# Patient Record
Sex: Male | Born: 1935 | Race: White | Hispanic: No | Marital: Married | Smoking: Never smoker
Health system: Southern US, Community
[De-identification: ages and names within clinical notes are randomized; demographics above are authoritative.]

## PROBLEM LIST (undated history)

## (undated) DIAGNOSIS — Z95810 Presence of automatic (implantable) cardiac defibrillator: Secondary | ICD-10-CM

## (undated) DIAGNOSIS — N390 Urinary tract infection, site not specified: Secondary | ICD-10-CM

## (undated) DIAGNOSIS — C61 Malignant neoplasm of prostate: Secondary | ICD-10-CM

## (undated) DIAGNOSIS — I5022 Chronic systolic (congestive) heart failure: Secondary | ICD-10-CM

## (undated) DIAGNOSIS — Z952 Presence of prosthetic heart valve: Secondary | ICD-10-CM

## (undated) DIAGNOSIS — K1120 Sialoadenitis, unspecified: Secondary | ICD-10-CM

## (undated) DIAGNOSIS — S32009A Unspecified fracture of unspecified lumbar vertebra, initial encounter for closed fracture: Secondary | ICD-10-CM

## (undated) DIAGNOSIS — I714 Abdominal aortic aneurysm, without rupture, unspecified: Secondary | ICD-10-CM

## (undated) DIAGNOSIS — M1990 Unspecified osteoarthritis, unspecified site: Secondary | ICD-10-CM

## (undated) DIAGNOSIS — G4730 Sleep apnea, unspecified: Secondary | ICD-10-CM

## (undated) DIAGNOSIS — I472 Ventricular tachycardia, unspecified: Secondary | ICD-10-CM

## (undated) DIAGNOSIS — E785 Hyperlipidemia, unspecified: Secondary | ICD-10-CM

## (undated) DIAGNOSIS — R400 Somnolence: Secondary | ICD-10-CM

## (undated) DIAGNOSIS — F0390 Unspecified dementia without behavioral disturbance: Secondary | ICD-10-CM

## (undated) DIAGNOSIS — H9190 Unspecified hearing loss, unspecified ear: Secondary | ICD-10-CM

## (undated) DIAGNOSIS — I350 Nonrheumatic aortic (valve) stenosis: Secondary | ICD-10-CM

## (undated) DIAGNOSIS — J45909 Unspecified asthma, uncomplicated: Secondary | ICD-10-CM

## (undated) HISTORY — PX: COLECTOMY: SHX59

## (undated) HISTORY — PX: HERNIA REPAIR: SHX51

## (undated) HISTORY — DX: Abdominal aortic aneurysm, without rupture, unspecified: I71.40

## (undated) HISTORY — DX: Hyperlipidemia, unspecified: E78.5

## (undated) HISTORY — DX: Abdominal aortic aneurysm, without rupture: I71.4

## (undated) HISTORY — PX: CATARACT EXTRACTION W/ INTRAOCULAR LENS IMPLANT: SHX1309

## (undated) HISTORY — DX: Chronic systolic (congestive) heart failure: I50.22

---
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
## 1999-04-04 ENCOUNTER — Other Ambulatory Visit: Admission: RE | Admit: 1999-04-04 | Discharge: 1999-04-04 | Payer: Self-pay | Admitting: Gastroenterology

## 2002-02-08 ENCOUNTER — Ambulatory Visit (HOSPITAL_BASED_OUTPATIENT_CLINIC_OR_DEPARTMENT_OTHER): Admission: RE | Admit: 2002-02-08 | Discharge: 2002-02-08 | Payer: Self-pay | Admitting: Pulmonary Disease

## 2004-07-19 ENCOUNTER — Emergency Department (HOSPITAL_COMMUNITY): Admission: EM | Admit: 2004-07-19 | Discharge: 2004-07-19 | Payer: Self-pay | Admitting: Family Medicine

## 2004-08-18 ENCOUNTER — Inpatient Hospital Stay (HOSPITAL_COMMUNITY): Admission: EM | Admit: 2004-08-18 | Discharge: 2004-08-24 | Payer: Self-pay | Admitting: Emergency Medicine

## 2004-08-20 HISTORY — PX: CARDIAC CATHETERIZATION: SHX172

## 2004-08-23 HISTORY — PX: CARDIAC DEFIBRILLATOR PLACEMENT: SHX171

## 2004-11-20 ENCOUNTER — Ambulatory Visit (HOSPITAL_COMMUNITY): Admission: RE | Admit: 2004-11-20 | Discharge: 2004-11-20 | Payer: Self-pay | Admitting: Cardiology

## 2005-06-10 ENCOUNTER — Ambulatory Visit: Payer: Self-pay | Admitting: Gastroenterology

## 2005-06-24 ENCOUNTER — Ambulatory Visit: Payer: Self-pay | Admitting: Gastroenterology

## 2012-01-09 DIAGNOSIS — I4891 Unspecified atrial fibrillation: Secondary | ICD-10-CM | POA: Diagnosis not present

## 2012-01-09 DIAGNOSIS — I2510 Atherosclerotic heart disease of native coronary artery without angina pectoris: Secondary | ICD-10-CM | POA: Diagnosis not present

## 2012-01-09 DIAGNOSIS — Z79899 Other long term (current) drug therapy: Secondary | ICD-10-CM | POA: Diagnosis not present

## 2012-01-29 DIAGNOSIS — E782 Mixed hyperlipidemia: Secondary | ICD-10-CM | POA: Diagnosis not present

## 2012-01-29 DIAGNOSIS — I2510 Atherosclerotic heart disease of native coronary artery without angina pectoris: Secondary | ICD-10-CM | POA: Diagnosis not present

## 2012-01-29 DIAGNOSIS — Z8249 Family history of ischemic heart disease and other diseases of the circulatory system: Secondary | ICD-10-CM | POA: Diagnosis not present

## 2012-01-29 DIAGNOSIS — Z950 Presence of cardiac pacemaker: Secondary | ICD-10-CM | POA: Diagnosis not present

## 2012-01-29 HISTORY — PX: NM MYOCAR PERF WALL MOTION: HXRAD629

## 2012-01-31 DIAGNOSIS — I472 Ventricular tachycardia: Secondary | ICD-10-CM | POA: Diagnosis not present

## 2012-01-31 DIAGNOSIS — Z4502 Encounter for adjustment and management of automatic implantable cardiac defibrillator: Secondary | ICD-10-CM | POA: Diagnosis not present

## 2012-02-28 DIAGNOSIS — I4891 Unspecified atrial fibrillation: Secondary | ICD-10-CM | POA: Diagnosis not present

## 2012-02-28 DIAGNOSIS — E785 Hyperlipidemia, unspecified: Secondary | ICD-10-CM | POA: Diagnosis not present

## 2012-02-28 DIAGNOSIS — I10 Essential (primary) hypertension: Secondary | ICD-10-CM | POA: Diagnosis not present

## 2012-02-28 DIAGNOSIS — G4733 Obstructive sleep apnea (adult) (pediatric): Secondary | ICD-10-CM | POA: Diagnosis not present

## 2012-05-11 DIAGNOSIS — I714 Abdominal aortic aneurysm, without rupture: Secondary | ICD-10-CM | POA: Diagnosis not present

## 2012-05-12 DIAGNOSIS — I472 Ventricular tachycardia: Secondary | ICD-10-CM | POA: Diagnosis not present

## 2012-05-12 DIAGNOSIS — I719 Aortic aneurysm of unspecified site, without rupture: Secondary | ICD-10-CM | POA: Diagnosis not present

## 2012-07-01 DIAGNOSIS — R079 Chest pain, unspecified: Secondary | ICD-10-CM | POA: Diagnosis not present

## 2012-07-01 DIAGNOSIS — R05 Cough: Secondary | ICD-10-CM | POA: Diagnosis not present

## 2012-07-01 DIAGNOSIS — Z23 Encounter for immunization: Secondary | ICD-10-CM | POA: Diagnosis not present

## 2012-07-07 DIAGNOSIS — L570 Actinic keratosis: Secondary | ICD-10-CM | POA: Diagnosis not present

## 2012-07-07 DIAGNOSIS — L821 Other seborrheic keratosis: Secondary | ICD-10-CM | POA: Diagnosis not present

## 2012-08-04 DIAGNOSIS — L89309 Pressure ulcer of unspecified buttock, unspecified stage: Secondary | ICD-10-CM | POA: Diagnosis not present

## 2012-08-04 DIAGNOSIS — I10 Essential (primary) hypertension: Secondary | ICD-10-CM | POA: Diagnosis not present

## 2012-08-11 DIAGNOSIS — L0293 Carbuncle, unspecified: Secondary | ICD-10-CM | POA: Diagnosis not present

## 2012-08-11 DIAGNOSIS — L0292 Furuncle, unspecified: Secondary | ICD-10-CM | POA: Diagnosis not present

## 2012-08-20 DIAGNOSIS — Z125 Encounter for screening for malignant neoplasm of prostate: Secondary | ICD-10-CM | POA: Diagnosis not present

## 2012-08-20 DIAGNOSIS — E785 Hyperlipidemia, unspecified: Secondary | ICD-10-CM | POA: Diagnosis not present

## 2012-08-20 DIAGNOSIS — I10 Essential (primary) hypertension: Secondary | ICD-10-CM | POA: Diagnosis not present

## 2012-08-20 DIAGNOSIS — R7301 Impaired fasting glucose: Secondary | ICD-10-CM | POA: Diagnosis not present

## 2012-08-27 DIAGNOSIS — E785 Hyperlipidemia, unspecified: Secondary | ICD-10-CM | POA: Diagnosis not present

## 2012-08-27 DIAGNOSIS — Z0000 Encounter for general adult medical examination without abnormal findings: Secondary | ICD-10-CM | POA: Diagnosis not present

## 2012-08-27 DIAGNOSIS — I472 Ventricular tachycardia: Secondary | ICD-10-CM | POA: Diagnosis not present

## 2012-08-27 DIAGNOSIS — R972 Elevated prostate specific antigen [PSA]: Secondary | ICD-10-CM | POA: Diagnosis not present

## 2012-08-27 DIAGNOSIS — I10 Essential (primary) hypertension: Secondary | ICD-10-CM | POA: Diagnosis not present

## 2012-08-27 DIAGNOSIS — Z1331 Encounter for screening for depression: Secondary | ICD-10-CM | POA: Diagnosis not present

## 2012-08-27 DIAGNOSIS — R7301 Impaired fasting glucose: Secondary | ICD-10-CM | POA: Diagnosis not present

## 2012-09-22 DIAGNOSIS — I509 Heart failure, unspecified: Secondary | ICD-10-CM | POA: Diagnosis not present

## 2012-09-22 DIAGNOSIS — I2510 Atherosclerotic heart disease of native coronary artery without angina pectoris: Secondary | ICD-10-CM | POA: Diagnosis not present

## 2012-09-22 DIAGNOSIS — E782 Mixed hyperlipidemia: Secondary | ICD-10-CM | POA: Diagnosis not present

## 2012-10-09 DIAGNOSIS — H8110 Benign paroxysmal vertigo, unspecified ear: Secondary | ICD-10-CM | POA: Diagnosis not present

## 2012-10-09 DIAGNOSIS — I4891 Unspecified atrial fibrillation: Secondary | ICD-10-CM | POA: Diagnosis not present

## 2012-10-09 DIAGNOSIS — I10 Essential (primary) hypertension: Secondary | ICD-10-CM | POA: Diagnosis not present

## 2013-03-02 DIAGNOSIS — I4891 Unspecified atrial fibrillation: Secondary | ICD-10-CM | POA: Diagnosis not present

## 2013-03-02 DIAGNOSIS — G4733 Obstructive sleep apnea (adult) (pediatric): Secondary | ICD-10-CM | POA: Diagnosis not present

## 2013-03-02 DIAGNOSIS — Z6836 Body mass index (BMI) 36.0-36.9, adult: Secondary | ICD-10-CM | POA: Diagnosis not present

## 2013-03-02 DIAGNOSIS — R7301 Impaired fasting glucose: Secondary | ICD-10-CM | POA: Diagnosis not present

## 2013-03-02 DIAGNOSIS — R972 Elevated prostate specific antigen [PSA]: Secondary | ICD-10-CM | POA: Diagnosis not present

## 2013-03-02 DIAGNOSIS — I10 Essential (primary) hypertension: Secondary | ICD-10-CM | POA: Diagnosis not present

## 2013-03-02 DIAGNOSIS — E785 Hyperlipidemia, unspecified: Secondary | ICD-10-CM | POA: Diagnosis not present

## 2013-03-02 DIAGNOSIS — I472 Ventricular tachycardia: Secondary | ICD-10-CM | POA: Diagnosis not present

## 2013-03-11 ENCOUNTER — Other Ambulatory Visit: Payer: Self-pay | Admitting: *Deleted

## 2013-03-11 MED ORDER — AMIODARONE HCL 100 MG PO TABS: 100.0000 mg | ORAL_TABLET | Freq: Two times a day (BID) | ORAL | Status: DC

## 2013-03-16 ENCOUNTER — Other Ambulatory Visit: Payer: Self-pay | Admitting: *Deleted

## 2013-03-18 DIAGNOSIS — J343 Hypertrophy of nasal turbinates: Secondary | ICD-10-CM | POA: Diagnosis not present

## 2013-03-18 DIAGNOSIS — J310 Chronic rhinitis: Secondary | ICD-10-CM | POA: Diagnosis not present

## 2013-04-01 ENCOUNTER — Ambulatory Visit (INDEPENDENT_AMBULATORY_CARE_PROVIDER_SITE_OTHER): Payer: Medicare Other | Admitting: *Deleted

## 2013-04-01 DIAGNOSIS — I472 Ventricular tachycardia: Secondary | ICD-10-CM | POA: Diagnosis not present

## 2013-04-01 NOTE — Patient Instructions (Addendum)
Your physician recommends that you schedule a follow-up appointment in: September 

## 2013-04-01 NOTE — Progress Notes (Signed)
In office ICD interrogation. Normal device function. No changes made this session. 

## 2013-04-02 ENCOUNTER — Telehealth: Payer: Self-pay | Admitting: *Deleted

## 2013-04-02 ENCOUNTER — Encounter: Payer: Self-pay | Admitting: Cardiovascular Disease

## 2013-04-02 LAB — ICD DEVICE OBSERVATION
BATTERY VOLTAGE: 2.65 V
CHARGE TIME: 10.01 s
DEV-0020ICD: NEGATIVE
FVT: 0
HV IMPEDENCE: 45 Ohm
PACEART VT: 0
RV LEAD IMPEDENCE ICD: 504 Ohm
TZAT-0001FASTVT: 1
TZAT-0001SLOWVT: 1
TZAT-0001SLOWVT: 2
TZAT-0004FASTVT: 8
TZAT-0004SLOWVT: 8
TZAT-0004SLOWVT: 8
TZAT-0005FASTVT: 88 pct
TZAT-0011FASTVT: 10 ms
TZAT-0011SLOWVT: 10 ms
TZAT-0011SLOWVT: 10 ms
TZAT-0013FASTVT: 1
TZAT-0013SLOWVT: 3
TZAT-0013SLOWVT: 3
TZAT-0018FASTVT: NEGATIVE
TZAT-0018SLOWVT: NEGATIVE
TZAT-0018SLOWVT: NEGATIVE
TZON-0003FASTVT: 300 ms
TZON-0003SLOWVT: 400 ms
TZON-0004SLOWVT: 16
TZON-0005SLOWVT: 12
TZST-0001FASTVT: 2
TZST-0001FASTVT: 3
TZST-0001FASTVT: 4
TZST-0001FASTVT: 5
TZST-0001FASTVT: 6
TZST-0001SLOWVT: 3
TZST-0001SLOWVT: 4
TZST-0001SLOWVT: 5
TZST-0001SLOWVT: 6
TZST-0003FASTVT: 30 J
TZST-0003FASTVT: 35 J
TZST-0003FASTVT: 35 J
TZST-0003FASTVT: 35 J
TZST-0003FASTVT: 35 J
TZST-0003SLOWVT: 25 J
TZST-0003SLOWVT: 35 J
TZST-0003SLOWVT: 35 J
TZST-0003SLOWVT: 5 J
VENTRICULAR PACING ICD: 0 pct
VF: 0

## 2013-04-02 NOTE — Telephone Encounter (Signed)
During patient's device clinic appt yesterday pt mentioned how he had been fatigued since he's been back on Amiodarone. Patient and wife stated that he had been on just 100mg  daily in the past and felt great. I informed pt that I would discuss the issue with Dr.Croitoru and inform him of his suggestions.    After pt's appointment I discussed the issue with Dr.Croitoru and he stated that Mr.Biggar could decrease his Amiodarone to 100mg  daily. Patient was informed and voiced understanding.

## 2013-04-11 ENCOUNTER — Encounter (HOSPITAL_COMMUNITY): Payer: Self-pay | Admitting: Family Medicine

## 2013-04-11 ENCOUNTER — Emergency Department (HOSPITAL_COMMUNITY)
Admission: EM | Admit: 2013-04-11 | Discharge: 2013-04-12 | Disposition: A | Discharge status: Home / Self Care | Payer: Medicare Other | Attending: Emergency Medicine | Admitting: Emergency Medicine

## 2013-04-11 ENCOUNTER — Emergency Department (HOSPITAL_COMMUNITY): Payer: Medicare Other

## 2013-04-11 DIAGNOSIS — S4980XA Other specified injuries of shoulder and upper arm, unspecified arm, initial encounter: Secondary | ICD-10-CM | POA: Diagnosis not present

## 2013-04-11 DIAGNOSIS — S4992XA Unspecified injury of left shoulder and upper arm, initial encounter: Secondary | ICD-10-CM

## 2013-04-11 DIAGNOSIS — I472 Ventricular tachycardia, unspecified: Secondary | ICD-10-CM | POA: Insufficient documentation

## 2013-04-11 DIAGNOSIS — T07XXXA Unspecified multiple injuries, initial encounter: Secondary | ICD-10-CM | POA: Diagnosis not present

## 2013-04-11 DIAGNOSIS — Z79899 Other long term (current) drug therapy: Secondary | ICD-10-CM | POA: Insufficient documentation

## 2013-04-11 DIAGNOSIS — Z95810 Presence of automatic (implantable) cardiac defibrillator: Secondary | ICD-10-CM | POA: Diagnosis not present

## 2013-04-11 DIAGNOSIS — R112 Nausea with vomiting, unspecified: Secondary | ICD-10-CM | POA: Insufficient documentation

## 2013-04-11 DIAGNOSIS — R071 Chest pain on breathing: Secondary | ICD-10-CM | POA: Diagnosis not present

## 2013-04-11 DIAGNOSIS — W108XXA Fall (on) (from) other stairs and steps, initial encounter: Secondary | ICD-10-CM | POA: Insufficient documentation

## 2013-04-11 DIAGNOSIS — F411 Generalized anxiety disorder: Secondary | ICD-10-CM | POA: Insufficient documentation

## 2013-04-11 DIAGNOSIS — Z7982 Long term (current) use of aspirin: Secondary | ICD-10-CM | POA: Insufficient documentation

## 2013-04-11 DIAGNOSIS — M549 Dorsalgia, unspecified: Secondary | ICD-10-CM | POA: Diagnosis not present

## 2013-04-11 DIAGNOSIS — S298XXA Other specified injuries of thorax, initial encounter: Secondary | ICD-10-CM | POA: Diagnosis not present

## 2013-04-11 DIAGNOSIS — S46909A Unspecified injury of unspecified muscle, fascia and tendon at shoulder and upper arm level, unspecified arm, initial encounter: Secondary | ICD-10-CM | POA: Insufficient documentation

## 2013-04-11 DIAGNOSIS — Z8719 Personal history of other diseases of the digestive system: Secondary | ICD-10-CM | POA: Diagnosis not present

## 2013-04-11 DIAGNOSIS — IMO0002 Reserved for concepts with insufficient information to code with codable children: Secondary | ICD-10-CM | POA: Diagnosis not present

## 2013-04-11 DIAGNOSIS — S79919A Unspecified injury of unspecified hip, initial encounter: Secondary | ICD-10-CM | POA: Diagnosis not present

## 2013-04-11 DIAGNOSIS — M25559 Pain in unspecified hip: Secondary | ICD-10-CM | POA: Diagnosis not present

## 2013-04-11 DIAGNOSIS — S0990XA Unspecified injury of head, initial encounter: Secondary | ICD-10-CM | POA: Diagnosis not present

## 2013-04-11 DIAGNOSIS — Y92009 Unspecified place in unspecified non-institutional (private) residence as the place of occurrence of the external cause: Secondary | ICD-10-CM | POA: Insufficient documentation

## 2013-04-11 DIAGNOSIS — R51 Headache: Secondary | ICD-10-CM | POA: Diagnosis not present

## 2013-04-11 DIAGNOSIS — M545 Low back pain: Secondary | ICD-10-CM | POA: Diagnosis not present

## 2013-04-11 DIAGNOSIS — M25519 Pain in unspecified shoulder: Secondary | ICD-10-CM | POA: Diagnosis not present

## 2013-04-11 DIAGNOSIS — R404 Transient alteration of awareness: Secondary | ICD-10-CM | POA: Insufficient documentation

## 2013-04-11 DIAGNOSIS — Y998 Other external cause status: Secondary | ICD-10-CM | POA: Insufficient documentation

## 2013-04-11 DIAGNOSIS — T148XXA Other injury of unspecified body region, initial encounter: Secondary | ICD-10-CM | POA: Diagnosis not present

## 2013-04-11 DIAGNOSIS — I4729 Other ventricular tachycardia: Secondary | ICD-10-CM | POA: Insufficient documentation

## 2013-04-11 HISTORY — DX: Ventricular tachycardia: I47.2

## 2013-04-11 HISTORY — DX: Ventricular tachycardia, unspecified: I47.20

## 2013-04-11 MED ORDER — HYDROCODONE-ACETAMINOPHEN 5-325 MG PO TABS
1.0000 | ORAL_TABLET | Freq: Once | ORAL | Status: DC
  Filled 2013-04-11: qty 1

## 2013-04-11 MED ORDER — OXYCODONE HCL 5 MG PO TABS
5.0000 mg | ORAL_TABLET | Freq: Once | ORAL | Status: AC
  Administered 2013-04-11: 5 mg via ORAL
  Filled 2013-04-11: qty 1

## 2013-04-11 MED ORDER — ACETAMINOPHEN 325 MG PO TABS
650.0000 mg | ORAL_TABLET | Freq: Once | ORAL | Status: AC
  Administered 2013-04-11: 650 mg via ORAL
  Filled 2013-04-11: qty 2

## 2013-04-11 NOTE — ED Notes (Signed)
Pt from home via EMS. Pt reports tripping and falling backwards down 3 thickly-carpeted steps. Pt denies LOC, denies head injury or back pain, Pt c/o upper back and right shoulder pain. Pt had 2 episodes of emesis en route and was given 4mg  Zofran IV with relief. Pt A&O x4. Pt with ICD in place, denies dizziness.

## 2013-04-11 NOTE — ED Notes (Signed)
This RN received call from Xray stating pt became very anxious and "shaking" with pain. Upon arrival to Xray, pt lying in stretcher, states he is in too much pain to take X-rays at this time, states is "really worked up and anxious about my condition". This RN brought pt back to room and alerted EDPA. Pt alert and oriented x4. Pt cooperative.

## 2013-04-11 NOTE — ED Provider Notes (Signed)
History    CSN: [id number] Arrival date & time 04/11/13  2214  First MD Initiated Contact with Patient 04/11/13 2232     Chief Complaint  Patient presents with  . Fall   (Consider location/radiation/quality/duration/timing/severity/associated sxs/prior Treatment) HPI  79 year old male with history of ventricular tachycardia with ICD in place presents for evaluation of fall.  patient reports he normally uses both of his hands to grab the rail while walking up the stairs at his house. Today while walking up the stairs he was carrying a small blanket in one hand; he walks up 3 steps, lost his balance and fell backward hitting his right shoulder and back against the carpet steps. He complaining of sudden onset of sharp pain to right shoulder and right ribs. Pain is worsened with movement. He denies hitting his head or loss of consciousness. Denies any precipitating symptoms prior to fall. No complaint of neck pain, shortness of breath, abdominal pain, hip or leg pain. He does not take any blood thinner medication. He denies feeling any firing of his ICD device. Denies headache, dizziness or lightheadedness. He was brought here via EMS. Patient felt nauseous and had 2 bouts of emesis while route. He did receive Zofran and the symptom has resolved. Incident happened about an hour ago.  Past Medical History  Diagnosis Date  . ICD (implantable cardioverter-defibrillator) in place   . Ventricular tachycardia   . Hemorrhage     Post-colonostomy  . Hernia    Past Surgical History  Procedure Laterality Date  . Colon surgery    . Hernia repair     No family history on file. History  Substance Use Topics  . Smoking status: Never Smoker   . Smokeless tobacco: Never Used  . Alcohol Use: 0.0 oz/week    0 Cans of beer per week     Comment: "very Little"    Review of Systems  Constitutional: Negative for fever and diaphoresis.  HENT: Negative for neck pain.   Respiratory: Negative for  shortness of breath.   Cardiovascular:       Chest wall pain  Gastrointestinal: Negative for abdominal pain.  Skin: Negative for wound.  Neurological: Negative for numbness and headaches.    Allergies  Review of patient's allergies indicates not on file.  Home Medications   Current Outpatient Rx  Name  Route  Sig  Dispense  Refill  . amiodarone (PACERONE) 200 MG tablet   Oral   Take 100 mg by mouth daily.         Marland Kitchen aspirin 81 MG tablet   Oral   Take 81 mg by mouth daily.         . benzonatate (TESSALON) 100 MG capsule   Oral   Take 100 mg by mouth 3 (three) times daily as needed for cough.         . Cholecalciferol (VITAMIN D3) 5000 UNITS CAPS   Oral   Take by mouth daily.         Marland Kitchen ezetimibe-simvastatin (VYTORIN) 10-20 MG per tablet   Oral   Take 1 tablet by mouth at bedtime.         . fluticasone (FLONASE) 50 MCG/ACT nasal spray   Nasal   Place 2 sprays into the nose daily.         Marland Kitchen ipratropium (ATROVENT) 0.06 % nasal spray   Nasal   Place 2 sprays into the nose 2 (two) times daily. 2 sprays BID         .  loratadine (CLARITIN) 10 MG tablet   Oral   Take 10 mg by mouth as needed for allergies.         . metoprolol (LOPRESSOR) 50 MG tablet   Oral   Take 50 mg by mouth 2 (two) times daily.         . Multiple Minerals-Vitamins (CALCIUM & VIT D3 BONE HEALTH PO)   Oral   Take by mouth as needed.          BP 129/74  Pulse 76  Temp(Src) 97.7 F (36.5 C) (Oral)  Resp 18  Ht 5\' 10"  (1.778 m)  Wt 232 lb (105.235 kg)  BMI 33.29 kg/m2  SpO2 94% Physical Exam  Nursing note and vitals reviewed. Constitutional: He is oriented to person, place, and time. He appears well-developed and well-nourished. No distress.  HENT:  Head: Normocephalic and atraumatic.  No hemotympanum, no septal hematoma, no midface tenderness, no malocclusion.  Eyes: Conjunctivae are normal.  Neck: Normal range of motion. Neck supple.  Cardiovascular: Normal rate and  regular rhythm.   Pulmonary/Chest: Effort normal and breath sounds normal. He exhibits tenderness (Tenderness to right upper chest wall on palpation without crepitus, chest, deformity, emphysema, bruising).  Abdominal: Soft. There is no tenderness.  Musculoskeletal: He exhibits tenderness (R shoulder with mild tenderness throughout with decreased ROM, without deformity, bruise, or abrasion.).  Tenderness to right parathoracic region.  No deformity  Neurological: He is alert and oriented to person, place, and time.  Skin: Skin is warm. No rash noted.  Psychiatric: He has a normal mood and affect.    ED Course  Procedures (including critical care time)  10:51 PM Pt had mechanical fall, c/o pain of R shoulder, upper chest and upper back.  Will obtain xray.  Pain medication offered, pt request tylenol only.  NO significant midline spine tenderness.  Is NVI.    His O2 is 94% on RA which I interpret to be normal  12:10 AM Pt sts he's anxious and pain is not well controlled with oxycodone given 30 min ago.  Will give ativan 1mg  IV for anxiety.  WIll obtain xray.    1:13 AM X-ray shows no acute fractures or dislocation. Patient felt relieved afterward. Patient able to move right arm with some mild tenderness. However, when attempt to ambulate pt reports having difficulty standing.  Does c/o of pain to lower back and right hip, which was not appreciate on initial exam.  Will obtain xray of lspine and R hip.    If negative, pt will be discharge with Tylenol. Patient to followup with his PCP for further care. Return precautions discussed.  Care discussed with attending who will continue to monitor pt.    Labs Reviewed - No data to display Dg Ribs Unilateral W/chest Right  04/12/2013   *RADIOLOGY REPORT*  Clinical Data: Status post fall.  Upper back and right shoulder pain.  RIGHT RIBS AND CHEST - 3+ VIEW  Comparison: PA and lateral chest 08/24/2004.  Findings: AICD is in place.  There is  cardiomegaly.  Lungs are clear without pneumothorax or pleural effusion.  No fracture is identified.  IMPRESSION: No acute finding.   Original Report Authenticated By: Holley Dexter, M.D.   Dg Thoracic Spine 2 View  04/12/2013   *RADIOLOGY REPORT*  Clinical Data: Fall.  Back pain.  THORACIC SPINE - 2 VIEW  Comparison: PA and lateral chest 08/24/2004.  Findings: Vertebral body height and alignment appear normal. Scattered anterior endplate spurring is noted.  IMPRESSION: No acute finding.   Original Report Authenticated By: Holley Dexter, M.D.   Dg Shoulder Right  04/12/2013   *RADIOLOGY REPORT*  Clinical Data: Fall.  Right shoulder pain.  RIGHT SHOULDER - 2+ VIEW  Comparison: None.  Findings: There is no acute bony or joint abnormality.  Moderate acromioclavicular degenerative change is noted.  IMPRESSION: No acute finding.   Original Report Authenticated By: Holley Dexter, M.D.   1. Fall at home, initial encounter   2. Injury of left shoulder and upper arm     MDM  BP 108/70  Pulse 80  Temp(Src) 97.7 F (36.5 C) (Oral)  Resp 18  Ht 5\' 10"  (1.778 m)  Wt 232 lb (105.235 kg)  BMI 33.29 kg/m2  SpO2 93%  I have reviewed nursing notes and vital signs. I personally reviewed the imaging tests through PACS system  I reviewed available ER/hospitalization records thought the EMR   Fayrene Helper, PA-C 04/12/13 0114  Fayrene Helper, PA-C 04/12/13 [id number]

## 2013-04-11 NOTE — ED Notes (Signed)
PT declines Vicodin, requesting tylenol. EDPA aware

## 2013-04-12 ENCOUNTER — Emergency Department (HOSPITAL_COMMUNITY): Payer: Medicare Other

## 2013-04-12 ENCOUNTER — Encounter (HOSPITAL_COMMUNITY): Payer: Self-pay

## 2013-04-12 DIAGNOSIS — IMO0002 Reserved for concepts with insufficient information to code with codable children: Secondary | ICD-10-CM | POA: Diagnosis not present

## 2013-04-12 DIAGNOSIS — S79929A Unspecified injury of unspecified thigh, initial encounter: Secondary | ICD-10-CM | POA: Diagnosis not present

## 2013-04-12 DIAGNOSIS — M549 Dorsalgia, unspecified: Secondary | ICD-10-CM | POA: Diagnosis not present

## 2013-04-12 DIAGNOSIS — S4980XA Other specified injuries of shoulder and upper arm, unspecified arm, initial encounter: Secondary | ICD-10-CM | POA: Diagnosis not present

## 2013-04-12 DIAGNOSIS — S298XXA Other specified injuries of thorax, initial encounter: Secondary | ICD-10-CM | POA: Diagnosis not present

## 2013-04-12 DIAGNOSIS — M545 Low back pain: Secondary | ICD-10-CM | POA: Diagnosis not present

## 2013-04-12 DIAGNOSIS — R51 Headache: Secondary | ICD-10-CM | POA: Diagnosis not present

## 2013-04-12 DIAGNOSIS — M25559 Pain in unspecified hip: Secondary | ICD-10-CM | POA: Diagnosis not present

## 2013-04-12 DIAGNOSIS — S0990XA Unspecified injury of head, initial encounter: Secondary | ICD-10-CM | POA: Diagnosis not present

## 2013-04-12 DIAGNOSIS — M25519 Pain in unspecified shoulder: Secondary | ICD-10-CM | POA: Diagnosis not present

## 2013-04-12 LAB — ETHANOL: Alcohol, Ethyl (B): <11 mg/dL (ref 0–11)

## 2013-04-12 LAB — BASIC METABOLIC PANEL
Chloride: 101 mEq/L (ref 96–112)
Creatinine, Ser: 1.11 mg/dL (ref 0.50–1.35)
GFR calc Af Amer: 69 mL/min — ABNORMAL LOW (ref >90)
Potassium: 4.2 mEq/L (ref 3.5–5.1)
Sodium: 134 mEq/L — ABNORMAL LOW (ref 135–145)

## 2013-04-12 LAB — CBC
MCV: 92.9 fL (ref 78.0–100.0)
Platelets: 106 10*3/uL — ABNORMAL LOW (ref 150–400)
RBC: 4.35 MIL/uL (ref 4.22–5.81)
RDW: 13.8 % (ref 11.5–15.5)
WBC: 8.8 10*3/uL (ref 4.0–10.5)

## 2013-04-12 LAB — GLUCOSE, CAPILLARY: Glucose-Capillary: 189 mg/dL — ABNORMAL HIGH (ref 70–99)

## 2013-04-12 MED ORDER — MORPHINE SULFATE 4 MG/ML IJ SOLN
4.0000 mg | Freq: Once | INTRAMUSCULAR | Status: AC
  Administered 2013-04-12: 4 mg via INTRAVENOUS
  Filled 2013-04-12: qty 1

## 2013-04-12 MED ORDER — KETOROLAC TROMETHAMINE 30 MG/ML IJ SOLN
30.0000 mg | Freq: Once | INTRAMUSCULAR | Status: AC
  Administered 2013-04-12: 30 mg via INTRAVENOUS
  Filled 2013-04-12: qty 1

## 2013-04-12 MED ORDER — LORAZEPAM 2 MG/ML IJ SOLN
1.0000 mg | Freq: Once | INTRAMUSCULAR | Status: AC
  Administered 2013-04-12: 1 mg via INTRAVENOUS
  Filled 2013-04-12: qty 1

## 2013-04-12 MED ORDER — OXYCODONE-ACETAMINOPHEN 5-325 MG PO TABS: 1.0000 | ORAL_TABLET | ORAL | Status: DC | PRN

## 2013-04-12 MED ORDER — LORAZEPAM 2 MG/ML IJ SOLN: 1.0000 mg | Freq: Once | INTRAMUSCULAR | Status: DC

## 2013-04-12 MED ORDER — ACETAMINOPHEN 500 MG PO TABS: 500.0000 mg | ORAL_TABLET | Freq: Four times a day (QID) | ORAL | Status: DC | PRN

## 2013-04-12 NOTE — ED Notes (Signed)
Pt ambulated 7 feet stand by assist only

## 2013-04-12 NOTE — ED Notes (Signed)
Pt transported to Xray by this RN. 

## 2013-04-12 NOTE — ED Provider Notes (Signed)
Medical screening examination/treatment/procedure(s) were conducted as a shared visit with non-physician practitioner(s) and myself.  I personally evaluated the patient during the encounter  6:41 AM The patient feels much better at this time.  He is able to ambulate without as much difficulty.  Several times through the night had ambulated him he was having ongoing significant pain and is back and "all over" from the fall.  I think much of his difficulty getting around in the emergency apartment in the middle night with secondary to pain medications.  At one point he seems slightly confused/alternate for head CT was obtained to make sure we are not missing an occult head bleed that was worsening.  CBG was normal.  EKG demonstrates sinus rhythm with occasional PVCs. Discharge home in good condition.  The patient was offered a prescription for walker but he states he has a walker at home they will use to help with stability.  He was encouraged to return the ER for new or worsening symptoms   Date: 04/12/2013  Rate: 84  Rhythm: normal sinus rhythm  QRS Axis: normal  Intervals: normal  ST/T Wave abnormalities: normal  Conduction Disutrbances: Nonspecific interventricular conduction delay  Narrative Interpretation: occasional PVC  Old EKG Reviewed: no prior ecg available     Results for orders placed during the hospital encounter of 04/11/13  CBC      Result Value Range   WBC 8.8  4.0 - 10.5 K/uL   RBC 4.35  4.22 - 5.81 MIL/uL   Hemoglobin 14.6  13.0 - 17.0 g/dL   HCT 16.1  09.6 - 04.5 %   MCV 92.9  78.0 - 100.0 fL   MCH 33.6  26.0 - 34.0 pg   MCHC 36.1 (*) 30.0 - 36.0 g/dL   RDW 40.9  81.1 - 91.4 %   Platelets 106 (*) 150 - 400 K/uL  GLUCOSE, CAPILLARY      Result Value Range   Glucose-Capillary 189 (*) 70 - 99 mg/dL     Dg Ribs Unilateral W/chest Right  04/12/2013   *RADIOLOGY REPORT*  Clinical Data: Status post fall.  Upper back and right shoulder pain.  RIGHT RIBS AND CHEST - 3+  VIEW  Comparison: PA and lateral chest 08/24/2004.  Findings: AICD is in place.  There is cardiomegaly.  Lungs are clear without pneumothorax or pleural effusion.  No fracture is identified.  IMPRESSION: No acute finding.   Original Report Authenticated By: Holley Dexter, M.D.   Dg Thoracic Spine 2 View  04/12/2013   *RADIOLOGY REPORT*  Clinical Data: Fall.  Back pain.  THORACIC SPINE - 2 VIEW  Comparison: PA and lateral chest 08/24/2004.  Findings: Vertebral body height and alignment appear normal. Scattered anterior endplate spurring is noted.  IMPRESSION: No acute finding.   Original Report Authenticated By: Holley Dexter, M.D.   Dg Lumbar Spine Complete  04/12/2013   *RADIOLOGY REPORT*  Clinical Data: Fall.  Pain.  LUMBAR SPINE - COMPLETE 4+ VIEW  Comparison: None.  Findings: Osteopenia.  4.7 cm infrarenal abdominal aortic aneurysm. The thigh is noted by calcification.  Mild multilevel lumbar spondylosis is present, most pronounced at L4-L5.  Although there appears to be the compression fracture of L1 on the frontal view, on the lateral view there is no significant loss of vertebral body height.  This may be projectional or due to a Schmorl's node distorting the appearance on the frontal view.  Mild levoconvex curvature of the lumbar spine.  IMPRESSION: Mild lumbar spondylosis.  No acute osseous abnormality.   Original Report Authenticated By: Andreas Newport, M.D.   Dg Shoulder Right  04/12/2013   *RADIOLOGY REPORT*  Clinical Data: Fall.  Right shoulder pain.  RIGHT SHOULDER - 2+ VIEW  Comparison: None.  Findings: There is no acute bony or joint abnormality.  Moderate acromioclavicular degenerative change is noted.  IMPRESSION: No acute finding.   Original Report Authenticated By: Holley Dexter, M.D.   Dg Hip Complete Right  04/12/2013   *RADIOLOGY REPORT*  Clinical Data: Fall.  Pelvic pain.  RIGHT HIP - COMPLETE 2+ VIEW  Comparison: None.  Findings: Pelvic rings appear intact.  Pubic  symphysis appears normal.  Right hip is located.  Atherosclerosis is present. Obturator rings appear within normal limits.  No fracture. Surgical clip in the anatomic pelvis.  IMPRESSION: No acute osseous abnormality.   Original Report Authenticated By: Andreas Newport, M.D.   Ct Head Wo Contrast  04/12/2013   *RADIOLOGY REPORT*  Clinical Data: Fall.  Head pain.  CT HEAD WITHOUT CONTRAST  Technique:  Contiguous axial images were obtained from the base of the skull through the vertex without contrast.  Comparison: 07/19/2004.  Findings: Vertebrobasilar dolichoectasia. No mass lesion, mass effect, midline shift, hydrocephalus, hemorrhage.  No acute territorial cortical ischemia/infarct. Atrophy and chronic ischemic white matter disease is present.  Benign basal ganglia calcifications are present.  Mastoid air cells and paranasal sinuses within normal limits.  Right lens extraction.  Extraocular muscle calcifications.  Calvarium appears intact.  IMPRESSION: No acute intracranial abnormality.  Atrophy and chronic ischemic white matter disease.   Original Report Authenticated By: Andreas Newport, M.D.  I personally reviewed the imaging tests through PACS system I reviewed available ER/hospitalization records through the EMR   Lyanne Co, MD 04/12/13 [id number]

## 2013-04-12 NOTE — ED Notes (Signed)
Pt transported to Xray. 

## 2013-04-12 NOTE — ED Notes (Signed)
Pt returned from Xray. Alert and comfortable

## 2013-04-12 NOTE — ED Notes (Addendum)
Attempted to ambulate pt with Fayrene Helper PA and Ali Lowe RN at bedside. Pt able to stand and bear weight on BLE with three staff assist, unable to ambulate. Pt calling out in pain upon standing but unable to state exact location of pain.

## 2013-04-12 NOTE — ED Notes (Signed)
[place]ie PA notified of unchanged pain level.

## 2013-04-12 NOTE — ED Notes (Signed)
Pt returned from radiology.

## 2013-04-15 DIAGNOSIS — S32009A Unspecified fracture of unspecified lumbar vertebra, initial encounter for closed fracture: Secondary | ICD-10-CM | POA: Diagnosis not present

## 2013-04-21 DIAGNOSIS — IMO0002 Reserved for concepts with insufficient information to code with codable children: Secondary | ICD-10-CM | POA: Diagnosis not present

## 2013-04-21 DIAGNOSIS — R269 Unspecified abnormalities of gait and mobility: Secondary | ICD-10-CM | POA: Diagnosis not present

## 2013-04-21 DIAGNOSIS — M5137 Other intervertebral disc degeneration, lumbosacral region: Secondary | ICD-10-CM | POA: Diagnosis not present

## 2013-04-21 DIAGNOSIS — S32009A Unspecified fracture of unspecified lumbar vertebra, initial encounter for closed fracture: Secondary | ICD-10-CM | POA: Diagnosis not present

## 2013-04-21 DIAGNOSIS — Z9181 History of falling: Secondary | ICD-10-CM | POA: Diagnosis not present

## 2013-04-21 DIAGNOSIS — G8911 Acute pain due to trauma: Secondary | ICD-10-CM | POA: Diagnosis not present

## 2013-04-23 DIAGNOSIS — Z9181 History of falling: Secondary | ICD-10-CM | POA: Diagnosis not present

## 2013-04-23 DIAGNOSIS — IMO0002 Reserved for concepts with insufficient information to code with codable children: Secondary | ICD-10-CM | POA: Diagnosis not present

## 2013-04-23 DIAGNOSIS — M5137 Other intervertebral disc degeneration, lumbosacral region: Secondary | ICD-10-CM | POA: Diagnosis not present

## 2013-04-23 DIAGNOSIS — G8911 Acute pain due to trauma: Secondary | ICD-10-CM | POA: Diagnosis not present

## 2013-04-23 DIAGNOSIS — R269 Unspecified abnormalities of gait and mobility: Secondary | ICD-10-CM | POA: Diagnosis not present

## 2013-04-24 DIAGNOSIS — R269 Unspecified abnormalities of gait and mobility: Secondary | ICD-10-CM | POA: Diagnosis not present

## 2013-04-24 DIAGNOSIS — Z9181 History of falling: Secondary | ICD-10-CM | POA: Diagnosis not present

## 2013-04-24 DIAGNOSIS — G8911 Acute pain due to trauma: Secondary | ICD-10-CM | POA: Diagnosis not present

## 2013-04-24 DIAGNOSIS — M5137 Other intervertebral disc degeneration, lumbosacral region: Secondary | ICD-10-CM | POA: Diagnosis not present

## 2013-04-24 DIAGNOSIS — IMO0002 Reserved for concepts with insufficient information to code with codable children: Secondary | ICD-10-CM | POA: Diagnosis not present

## 2013-04-26 DIAGNOSIS — M5137 Other intervertebral disc degeneration, lumbosacral region: Secondary | ICD-10-CM | POA: Diagnosis not present

## 2013-04-26 DIAGNOSIS — IMO0002 Reserved for concepts with insufficient information to code with codable children: Secondary | ICD-10-CM | POA: Diagnosis not present

## 2013-04-26 DIAGNOSIS — Z9181 History of falling: Secondary | ICD-10-CM | POA: Diagnosis not present

## 2013-04-26 DIAGNOSIS — G8911 Acute pain due to trauma: Secondary | ICD-10-CM | POA: Diagnosis not present

## 2013-04-26 DIAGNOSIS — R269 Unspecified abnormalities of gait and mobility: Secondary | ICD-10-CM | POA: Diagnosis not present

## 2013-04-28 DIAGNOSIS — M5137 Other intervertebral disc degeneration, lumbosacral region: Secondary | ICD-10-CM | POA: Diagnosis not present

## 2013-04-28 DIAGNOSIS — R269 Unspecified abnormalities of gait and mobility: Secondary | ICD-10-CM | POA: Diagnosis not present

## 2013-04-28 DIAGNOSIS — Z9181 History of falling: Secondary | ICD-10-CM | POA: Diagnosis not present

## 2013-04-28 DIAGNOSIS — IMO0002 Reserved for concepts with insufficient information to code with codable children: Secondary | ICD-10-CM | POA: Diagnosis not present

## 2013-04-28 DIAGNOSIS — G8911 Acute pain due to trauma: Secondary | ICD-10-CM | POA: Diagnosis not present

## 2013-04-29 DIAGNOSIS — S32009A Unspecified fracture of unspecified lumbar vertebra, initial encounter for closed fracture: Secondary | ICD-10-CM | POA: Diagnosis not present

## 2013-04-30 DIAGNOSIS — IMO0002 Reserved for concepts with insufficient information to code with codable children: Secondary | ICD-10-CM | POA: Diagnosis not present

## 2013-04-30 DIAGNOSIS — M5137 Other intervertebral disc degeneration, lumbosacral region: Secondary | ICD-10-CM | POA: Diagnosis not present

## 2013-04-30 DIAGNOSIS — Z9181 History of falling: Secondary | ICD-10-CM | POA: Diagnosis not present

## 2013-04-30 DIAGNOSIS — R269 Unspecified abnormalities of gait and mobility: Secondary | ICD-10-CM | POA: Diagnosis not present

## 2013-04-30 DIAGNOSIS — G8911 Acute pain due to trauma: Secondary | ICD-10-CM | POA: Diagnosis not present

## 2013-05-04 DIAGNOSIS — M5137 Other intervertebral disc degeneration, lumbosacral region: Secondary | ICD-10-CM | POA: Diagnosis not present

## 2013-05-04 DIAGNOSIS — R269 Unspecified abnormalities of gait and mobility: Secondary | ICD-10-CM | POA: Diagnosis not present

## 2013-05-04 DIAGNOSIS — G8911 Acute pain due to trauma: Secondary | ICD-10-CM | POA: Diagnosis not present

## 2013-05-04 DIAGNOSIS — IMO0002 Reserved for concepts with insufficient information to code with codable children: Secondary | ICD-10-CM | POA: Diagnosis not present

## 2013-05-04 DIAGNOSIS — Z9181 History of falling: Secondary | ICD-10-CM | POA: Diagnosis not present

## 2013-05-06 DIAGNOSIS — IMO0002 Reserved for concepts with insufficient information to code with codable children: Secondary | ICD-10-CM | POA: Diagnosis not present

## 2013-05-06 DIAGNOSIS — Z9181 History of falling: Secondary | ICD-10-CM | POA: Diagnosis not present

## 2013-05-06 DIAGNOSIS — M5137 Other intervertebral disc degeneration, lumbosacral region: Secondary | ICD-10-CM | POA: Diagnosis not present

## 2013-05-06 DIAGNOSIS — G8911 Acute pain due to trauma: Secondary | ICD-10-CM | POA: Diagnosis not present

## 2013-05-06 DIAGNOSIS — R269 Unspecified abnormalities of gait and mobility: Secondary | ICD-10-CM | POA: Diagnosis not present

## 2013-05-10 DIAGNOSIS — R269 Unspecified abnormalities of gait and mobility: Secondary | ICD-10-CM | POA: Diagnosis not present

## 2013-05-10 DIAGNOSIS — M5137 Other intervertebral disc degeneration, lumbosacral region: Secondary | ICD-10-CM | POA: Diagnosis not present

## 2013-05-10 DIAGNOSIS — Z9181 History of falling: Secondary | ICD-10-CM | POA: Diagnosis not present

## 2013-05-10 DIAGNOSIS — IMO0002 Reserved for concepts with insufficient information to code with codable children: Secondary | ICD-10-CM | POA: Diagnosis not present

## 2013-05-10 DIAGNOSIS — G8911 Acute pain due to trauma: Secondary | ICD-10-CM | POA: Diagnosis not present

## 2013-05-14 DIAGNOSIS — M5137 Other intervertebral disc degeneration, lumbosacral region: Secondary | ICD-10-CM | POA: Diagnosis not present

## 2013-05-14 DIAGNOSIS — G8911 Acute pain due to trauma: Secondary | ICD-10-CM | POA: Diagnosis not present

## 2013-05-14 DIAGNOSIS — IMO0002 Reserved for concepts with insufficient information to code with codable children: Secondary | ICD-10-CM | POA: Diagnosis not present

## 2013-05-14 DIAGNOSIS — Z9181 History of falling: Secondary | ICD-10-CM | POA: Diagnosis not present

## 2013-05-14 DIAGNOSIS — R269 Unspecified abnormalities of gait and mobility: Secondary | ICD-10-CM | POA: Diagnosis not present

## 2013-05-17 DIAGNOSIS — Z9181 History of falling: Secondary | ICD-10-CM | POA: Diagnosis not present

## 2013-05-17 DIAGNOSIS — M5137 Other intervertebral disc degeneration, lumbosacral region: Secondary | ICD-10-CM | POA: Diagnosis not present

## 2013-05-17 DIAGNOSIS — IMO0002 Reserved for concepts with insufficient information to code with codable children: Secondary | ICD-10-CM | POA: Diagnosis not present

## 2013-05-17 DIAGNOSIS — R269 Unspecified abnormalities of gait and mobility: Secondary | ICD-10-CM | POA: Diagnosis not present

## 2013-05-17 DIAGNOSIS — G8911 Acute pain due to trauma: Secondary | ICD-10-CM | POA: Diagnosis not present

## 2013-05-20 DIAGNOSIS — S32009A Unspecified fracture of unspecified lumbar vertebra, initial encounter for closed fracture: Secondary | ICD-10-CM | POA: Diagnosis not present

## 2013-05-21 ENCOUNTER — Encounter: Payer: Self-pay | Admitting: *Deleted

## 2013-05-21 DIAGNOSIS — M5137 Other intervertebral disc degeneration, lumbosacral region: Secondary | ICD-10-CM | POA: Diagnosis not present

## 2013-05-21 DIAGNOSIS — IMO0002 Reserved for concepts with insufficient information to code with codable children: Secondary | ICD-10-CM | POA: Diagnosis not present

## 2013-05-21 DIAGNOSIS — R269 Unspecified abnormalities of gait and mobility: Secondary | ICD-10-CM | POA: Diagnosis not present

## 2013-05-21 DIAGNOSIS — G8911 Acute pain due to trauma: Secondary | ICD-10-CM | POA: Diagnosis not present

## 2013-05-21 DIAGNOSIS — Z9181 History of falling: Secondary | ICD-10-CM | POA: Diagnosis not present

## 2013-05-24 ENCOUNTER — Ambulatory Visit (INDEPENDENT_AMBULATORY_CARE_PROVIDER_SITE_OTHER): Payer: Medicare Other | Admitting: Cardiovascular Disease

## 2013-05-24 ENCOUNTER — Encounter: Payer: Self-pay | Admitting: Cardiovascular Disease

## 2013-05-24 VITALS — BP 114/62 | HR 82 | Ht 70.0 in | Wt 227.3 lb

## 2013-05-24 DIAGNOSIS — G4733 Obstructive sleep apnea (adult) (pediatric): Secondary | ICD-10-CM | POA: Diagnosis not present

## 2013-05-24 DIAGNOSIS — I2510 Atherosclerotic heart disease of native coronary artery without angina pectoris: Secondary | ICD-10-CM | POA: Diagnosis not present

## 2013-05-24 DIAGNOSIS — I472 Ventricular tachycardia, unspecified: Secondary | ICD-10-CM

## 2013-05-24 DIAGNOSIS — E785 Hyperlipidemia, unspecified: Secondary | ICD-10-CM

## 2013-05-24 DIAGNOSIS — I428 Other cardiomyopathies: Secondary | ICD-10-CM | POA: Diagnosis not present

## 2013-05-24 DIAGNOSIS — I714 Abdominal aortic aneurysm, without rupture, unspecified: Secondary | ICD-10-CM

## 2013-05-24 DIAGNOSIS — Z95810 Presence of automatic (implantable) cardiac defibrillator: Secondary | ICD-10-CM

## 2013-05-24 LAB — ICD DEVICE OBSERVATION
BATTERY VOLTAGE: 2.64 V
BRDY-0002RV: 40 {beats}/min
CHARGE TIME: 10.01 s
FVT: 0
MODE SWITCH EPISODES: 0
RV LEAD IMPEDENCE ICD: 512 Ohm
TZAT-0001SLOWVT: 1
TZAT-0001SLOWVT: 2
TZAT-0004FASTVT: 8
TZAT-0004SLOWVT: 8
TZAT-0004SLOWVT: 8
TZAT-0005FASTVT: 88 pct
TZAT-0011SLOWVT: 10 ms
TZAT-0013FASTVT: 1
TZAT-0018FASTVT: NEGATIVE
TZON-0005SLOWVT: 12
TZST-0001FASTVT: 4
TZST-0001FASTVT: 6
TZST-0001SLOWVT: 3
TZST-0001SLOWVT: 6
TZST-0003FASTVT: 35 J
TZST-0003FASTVT: 35 J
TZST-0003SLOWVT: 25 J
TZST-0003SLOWVT: 35 J
VF: 0

## 2013-05-24 NOTE — Patient Instructions (Addendum)
.  Your physician has recommended that you have a sleep study. This test records several body functions during sleep, including: brain activity, eye movement, oxygen and carbon dioxide blood levels, heart rate and rhythm, breathing rate and rhythm, the flow of air through your mouth and nose, snoring, body muscle movements, and chest and belly movement.  Your physician recommends that you schedule a follow-up appointment in: 3 months   

## 2013-05-24 NOTE — Progress Notes (Signed)
In office ICD interrogation. Normal device function. No changes made this session. 

## 2013-05-25 DIAGNOSIS — IMO0002 Reserved for concepts with insufficient information to code with codable children: Secondary | ICD-10-CM | POA: Diagnosis not present

## 2013-05-25 DIAGNOSIS — M5137 Other intervertebral disc degeneration, lumbosacral region: Secondary | ICD-10-CM | POA: Diagnosis not present

## 2013-05-25 DIAGNOSIS — G8911 Acute pain due to trauma: Secondary | ICD-10-CM | POA: Diagnosis not present

## 2013-05-25 DIAGNOSIS — Z9181 History of falling: Secondary | ICD-10-CM | POA: Diagnosis not present

## 2013-05-25 DIAGNOSIS — R269 Unspecified abnormalities of gait and mobility: Secondary | ICD-10-CM | POA: Diagnosis not present

## 2013-05-28 DIAGNOSIS — I472 Ventricular tachycardia, unspecified: Secondary | ICD-10-CM | POA: Insufficient documentation

## 2013-05-28 DIAGNOSIS — I714 Abdominal aortic aneurysm, without rupture: Secondary | ICD-10-CM | POA: Insufficient documentation

## 2013-05-28 DIAGNOSIS — G4733 Obstructive sleep apnea (adult) (pediatric): Secondary | ICD-10-CM | POA: Insufficient documentation

## 2013-05-28 DIAGNOSIS — R269 Unspecified abnormalities of gait and mobility: Secondary | ICD-10-CM | POA: Diagnosis not present

## 2013-05-28 DIAGNOSIS — E785 Hyperlipidemia, unspecified: Secondary | ICD-10-CM | POA: Insufficient documentation

## 2013-05-28 DIAGNOSIS — Z9181 History of falling: Secondary | ICD-10-CM | POA: Diagnosis not present

## 2013-05-28 DIAGNOSIS — I428 Other cardiomyopathies: Secondary | ICD-10-CM | POA: Insufficient documentation

## 2013-05-28 DIAGNOSIS — I2510 Atherosclerotic heart disease of native coronary artery without angina pectoris: Secondary | ICD-10-CM | POA: Insufficient documentation

## 2013-05-28 DIAGNOSIS — IMO0002 Reserved for concepts with insufficient information to code with codable children: Secondary | ICD-10-CM | POA: Diagnosis not present

## 2013-05-28 DIAGNOSIS — M5137 Other intervertebral disc degeneration, lumbosacral region: Secondary | ICD-10-CM | POA: Diagnosis not present

## 2013-05-28 DIAGNOSIS — Z95810 Presence of automatic (implantable) cardiac defibrillator: Secondary | ICD-10-CM | POA: Insufficient documentation

## 2013-05-28 DIAGNOSIS — G8911 Acute pain due to trauma: Secondary | ICD-10-CM | POA: Diagnosis not present

## 2013-05-28 NOTE — Assessment & Plan Note (Signed)
He has several symptoms that suggest this possible diagnosis. I have recommended he consider a sleep study. He has seen Dr. Vickey Huger in the past.

## 2013-05-28 NOTE — Assessment & Plan Note (Signed)
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 Since he was free of angina and there was no evidence of reversible ischemia we elected to proceed with medical management.

## 2013-05-28 NOTE — Assessment & Plan Note (Signed)
He has done reasonably well. Only one episode of nonsustained ventricular tachycardia has been recorded since his last device check. No therapy has been delivered. He should continue amiodarone. I agree with his desire to be on the minimum tolerated dose but I doubt that he will do well if we discontinue it altogether. He needs yearly followup of his thyroid and liver function tests, a yearly ophthalmological exam and constant attention to avoid phototoxicity. He has no evidence of pulmonary complications. I do not think we have ever obtain baseline pulmonary function tests so I'm not sure that yearly PFTs now make a lot of sense.

## 2013-05-28 NOTE — Assessment & Plan Note (Signed)
Full interrogation of his device shows normal function. His defibrillator lead is under advisory. His device is approaching elective replacement indicator (this occurs at 2.62 V battery, current reading 2.64 V) and we anticipate that we will place a new defibrillator lead at the time of generator change at within the next 6-12 months.

## 2013-05-28 NOTE — Progress Notes (Signed)
Patient ID: CON ARGANBRIGHT, male   DOB: 07/10/1928, 79 y.o.   MRN: [id number]      Reason for office visit Followup cardiomyopathy, ventricular tachycardia, defibrillator, abdominal aortic aneurysm  [name] has not had major complications since his last appointment. He has become much more inactive. His family says that he "falls asleep anytime". This is especially true if he watches television. His appetite has not been the greatest and he complains of early satiety. He has lost about 18 pounds since his last appointment but this doesn't appear to be intentional.  Interrogation of his defibrillator shows normal function. His defibrillator lead is a Sprint Fidelis [id number] subjective visor but has not shown any abnormalities in impedance or function. The system was implanted in 2005 and his generator is approaching elective replacement indicator.  His device was implanted as secondary prevention for sustained ventricular tachycardia. At that time his workup show mildly depressed to borderline low left ventricular ejection fraction of around 45-50%. He underwent coronary angiography that showed intermediate severity stenoses in multiple distributions including 3 separate 40% lesions in the right coronary artery.   A nuclear stress test performed in April of last year did not show reversible ischemia but appear to show fairly convincing inferior wall scar (he is obese and there may be diaphragmatic attenuation, but the defect was quite distinct.). Since he did not have angina or reversible ischemia the decision was made to manage this medically. He does not have overt symptoms of congestive heart failure and has not required diuretic therapy  He has a small asymptomatic abdominal aortic aneurysm that measured 3.8 cm in diameter at last check (one year ago) and has been stable in size for several years.    Allergies  Allergen Reactions  . Crestor [Rosuvastatin]   . Lipitor [Atorvastatin]      Current Outpatient Prescriptions  Medication Sig Dispense Refill  . amiodarone (PACERONE) 200 MG tablet Take 100 mg by mouth daily.      Marland Kitchen ezetimibe-simvastatin (VYTORIN) 10-20 MG per tablet Take 1 tablet by mouth at bedtime.      Marland Kitchen ibuprofen (ADVIL,MOTRIN) 200 MG tablet Take 200 mg by mouth every 6 (six) hours as needed for pain.      Marland Kitchen loratadine (CLARITIN) 10 MG tablet Take 10 mg by mouth as needed for allergies.      . metoprolol (LOPRESSOR) 50 MG tablet Take 50 mg by mouth 2 (two) times daily.      . Multiple Minerals-Vitamins (CALCIUM & VIT D3 BONE HEALTH PO) Take by mouth as needed.      Marland Kitchen OVER THE COUNTER MEDICATION Take 2 tablets by mouth at bedtime. Phillips capsules for constipation      . aspirin 81 MG tablet Take 81 mg by mouth daily as needed for pain.        No current facility-administered medications for this visit.    Past Medical History  Diagnosis Date  . ICD (implantable cardioverter-defibrillator) in place 08/23/2004    Medtronic  . Ventricular tachycardia   . Hemorrhage     Post-colonostomy  . Hernia   . CHF (congestive heart failure)   . Systolic dysfunction, left ventricle   . AAA (abdominal aortic aneurysm)     7/13 3.8cm  . Dyslipidemia     Past Surgical History  Procedure Laterality Date  . Colon surgery    . Hernia repair    . Nm myocar perf wall motion  01/29/2012    abnormal c/o infarct/scar,no  ischemia present  . Cardiac catheterization  08/20/2004    noncritical CAD,mild global hypokinesis, EF 50%    Family History  Problem Relation Age of Onset  . Heart failure Mother   . Heart failure Father     History   Social History  . Marital Status: Married    Spouse Name: N/A    Number of Children: N/A  . Years of Education: N/A   Occupational History  . Not on file.   Social History Main Topics  . Smoking status: Never Smoker   . Smokeless tobacco: Never Used  . Alcohol Use: 0.0 oz/week    0 Cans of beer per week     Comment:  "very Little"  . Drug Use: No  . Sexual Activity: Not on file   Other Topics Concern  . Not on file   Social History Narrative  . No narrative on file    Review of systems:     PHYSICAL EXAM BP 114/62  Pulse 82  Ht 5\' 10"  (1.778 m)  Wt 227 lb 4.8 oz (103.103 kg)  BMI 32.61 kg/m2 General: Alert, oriented x3, no distress, obese Head: no evidence of trauma, PERRL, EOMI, no exophtalmos or lid lag, no myxedema, no xanthelasma; normal ears, nose and oropharynx Neck: normal jugular venous pulsations and no hepatojugular reflux; brisk carotid pulses without delay and no carotid bruits Chest: clear to auscultation, no signs of consolidation by percussion or palpation, normal fremitus, symmetrical and full respiratory excursions, of the left subclavian ICD site Cardiovascular: normal position and quality of the apical impulse, regular rhythm at baseline with very frequent ectopic beats in a pattern of trigeminy, normal first and second heart sounds, no murmurs, rubs or gallops Abdomen: Protuberant,  no tenderness or distention, no masses by palpation, no abnormal pulsatility or arterial bruits, normal bowel sounds, no hepatosplenomegaly Extremities: no clubbing, cyanosis or edema; 2+ radial, ulnar and brachial pulses bilaterally; 2+ right femoral, posterior tibial and dorsalis pedis pulses; 2+ left femoral, posterior tibial and dorsalis pedis pulses; no subclavian or femoral bruits Neurological: grossly nonfocal  EKG: Sinus rhythm with first degree AV block, nonspecific intraventricular conduction delay most closely resembling a left bundle branch block with left axis deviation; PVCs in a pattern of trigeminy  Lipid Panel December 2013 total cholesterol 124, triglycerides 96, HDL 32, LDL 73 hemoglobin Z6X 5.1% creatinine 1.3  BMET    Component Value Date/Time   NA 134* 04/12/2013 0557   K 4.2 04/12/2013 0557   CL 101 04/12/2013 0557   CO2 24 04/12/2013 0557   GLUCOSE 192* 04/12/2013 0557     BUN 28* 04/12/2013 0557   CREATININE 1.11 04/12/2013 0557   CALCIUM 8.5 04/12/2013 0557   GFRNONAA 59* 04/12/2013 0557   GFRAA 69* 04/12/2013 0557     ASSESSMENT AND PLAN Hyperlipidemia Intolerant to Crestor and Lipitor. Note potential amiodarone-simvastatin interaction, but no complications to date.  Nonischemic cardiomyopathy His nuclear stress test in 2013 actually showed what appears to be convincing evidence of an inferior wall scar but there was no evidence of reversible ischemia. 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 Since he was free of angina and there was no evidence of reversible ischemia we elected to proceed with medical management.  Sustained VT (ventricular tachycardia) He has done reasonably well. Only one episode of nonsustained ventricular tachycardia has been recorded since his last device check. No therapy  has been delivered. He should continue amiodarone. I agree with his desire to be on the minimum tolerated dose but I doubt that he will do well if we discontinue it altogether. He needs yearly followup of his thyroid and liver function tests, a yearly ophthalmological exam and constant attention to avoid phototoxicity. He has no evidence of pulmonary complications. I do not think we have ever obtain baseline pulmonary function tests so I'm not sure that yearly PFTs now make a lot of sense.  ICD, sprint fidelis lead under advisory Full interrogation of his device shows normal function. His defibrillator lead is under advisory. His device is approaching elective replacement indicator (this occurs at 2.62 V battery, current reading 2.64 V) and we anticipate that we will place a new defibrillator lead at the time of generator change at within the next 6-12 months.  OSA (obstructive sleep apnea) - presumptive diagnosis He has several symptoms that suggest this possible diagnosis. I have  recommended he consider a sleep study. He has seen Dr. Vickey Huger in the past.  AAA (abdominal aortic aneurysm) We'll schedule for a yearly followup  Orders Placed This Encounter  Procedures  . ICD Device Observation  . EKG 12-Lead  . Split night study   No orders of the defined types were placed in this encounter.    Junious Silk, MD, [facility] [phone] office [phone] pager

## 2013-05-28 NOTE — Assessment & Plan Note (Signed)
We'll schedule for a yearly followup

## 2013-05-28 NOTE — Assessment & Plan Note (Signed)
Intolerant to Crestor and Lipitor. Note potential amiodarone-simvastatin interaction, but no complications to date.

## 2013-06-01 ENCOUNTER — Telehealth: Payer: Self-pay | Admitting: *Deleted

## 2013-06-01 DIAGNOSIS — I714 Abdominal aortic aneurysm, without rupture: Secondary | ICD-10-CM

## 2013-06-01 DIAGNOSIS — R269 Unspecified abnormalities of gait and mobility: Secondary | ICD-10-CM | POA: Diagnosis not present

## 2013-06-01 DIAGNOSIS — M5137 Other intervertebral disc degeneration, lumbosacral region: Secondary | ICD-10-CM | POA: Diagnosis not present

## 2013-06-01 DIAGNOSIS — Z9181 History of falling: Secondary | ICD-10-CM | POA: Diagnosis not present

## 2013-06-01 DIAGNOSIS — G8911 Acute pain due to trauma: Secondary | ICD-10-CM | POA: Diagnosis not present

## 2013-06-01 DIAGNOSIS — IMO0002 Reserved for concepts with insufficient information to code with codable children: Secondary | ICD-10-CM | POA: Diagnosis not present

## 2013-06-01 NOTE — Telephone Encounter (Signed)
Duplex US of abdomen for re-evaluation of infrarenal AAA.  Pt notified he will be contacted by scheduling.

## 2013-06-03 DIAGNOSIS — G8911 Acute pain due to trauma: Secondary | ICD-10-CM | POA: Diagnosis not present

## 2013-06-03 DIAGNOSIS — R269 Unspecified abnormalities of gait and mobility: Secondary | ICD-10-CM | POA: Diagnosis not present

## 2013-06-03 DIAGNOSIS — Z9181 History of falling: Secondary | ICD-10-CM | POA: Diagnosis not present

## 2013-06-03 DIAGNOSIS — IMO0002 Reserved for concepts with insufficient information to code with codable children: Secondary | ICD-10-CM | POA: Diagnosis not present

## 2013-06-03 DIAGNOSIS — M5137 Other intervertebral disc degeneration, lumbosacral region: Secondary | ICD-10-CM | POA: Diagnosis not present

## 2013-06-15 DIAGNOSIS — M5137 Other intervertebral disc degeneration, lumbosacral region: Secondary | ICD-10-CM | POA: Diagnosis not present

## 2013-06-15 DIAGNOSIS — Z9181 History of falling: Secondary | ICD-10-CM | POA: Diagnosis not present

## 2013-06-15 DIAGNOSIS — R269 Unspecified abnormalities of gait and mobility: Secondary | ICD-10-CM | POA: Diagnosis not present

## 2013-06-15 DIAGNOSIS — G8911 Acute pain due to trauma: Secondary | ICD-10-CM | POA: Diagnosis not present

## 2013-06-15 DIAGNOSIS — IMO0002 Reserved for concepts with insufficient information to code with codable children: Secondary | ICD-10-CM | POA: Diagnosis not present

## 2013-06-16 ENCOUNTER — Ambulatory Visit (HOSPITAL_COMMUNITY)
Admission: RE | Admit: 2013-06-16 | Discharge: 2013-06-16 | Disposition: A | Discharge status: Home / Self Care | Payer: Medicare Other | Source: Ambulatory Visit | Attending: Cardiovascular Disease | Admitting: Cardiovascular Disease

## 2013-06-16 DIAGNOSIS — I714 Abdominal aortic aneurysm, without rupture, unspecified: Secondary | ICD-10-CM | POA: Insufficient documentation

## 2013-06-16 NOTE — Progress Notes (Signed)
Aorta Duplex Completed. Blinda Turek, BS, RDMS, RVT  

## 2013-06-17 DIAGNOSIS — R269 Unspecified abnormalities of gait and mobility: Secondary | ICD-10-CM | POA: Diagnosis not present

## 2013-06-17 DIAGNOSIS — IMO0002 Reserved for concepts with insufficient information to code with codable children: Secondary | ICD-10-CM | POA: Diagnosis not present

## 2013-06-17 DIAGNOSIS — M5137 Other intervertebral disc degeneration, lumbosacral region: Secondary | ICD-10-CM | POA: Diagnosis not present

## 2013-06-17 DIAGNOSIS — Z9181 History of falling: Secondary | ICD-10-CM | POA: Diagnosis not present

## 2013-06-17 DIAGNOSIS — G8911 Acute pain due to trauma: Secondary | ICD-10-CM | POA: Diagnosis not present

## 2013-06-22 ENCOUNTER — Encounter: Payer: Medicare Other | Admitting: Cardiovascular Disease

## 2013-06-23 ENCOUNTER — Telehealth: Payer: Self-pay | Admitting: *Deleted

## 2013-06-23 ENCOUNTER — Encounter: Payer: Medicare Other | Admitting: Cardiovascular Disease

## 2013-06-23 DIAGNOSIS — I714 Abdominal aortic aneurysm, without rupture: Secondary | ICD-10-CM

## 2013-06-23 NOTE — Telephone Encounter (Signed)
Results of abdominal doppler given to wife.  Aneurysm same size and we will repeat the doppler in 12 months.  Voiced understanding.

## 2013-06-23 NOTE — Telephone Encounter (Signed)
Message copied by Vita Barley on Wed Jun 23, 2013  3:53 PM ------      Message from: Thurmon Fair      Created: Wed Jun 23, 2013 10:19 AM       No change in aneurysm size (3.7 cm max) Repeat in 12 months please ------

## 2013-07-07 DIAGNOSIS — M545 Low back pain: Secondary | ICD-10-CM | POA: Diagnosis not present

## 2013-07-09 DIAGNOSIS — M545 Low back pain: Secondary | ICD-10-CM | POA: Diagnosis not present

## 2013-07-13 DIAGNOSIS — M545 Low back pain: Secondary | ICD-10-CM | POA: Diagnosis not present

## 2013-07-22 DIAGNOSIS — M545 Low back pain: Secondary | ICD-10-CM | POA: Diagnosis not present

## 2013-07-28 DIAGNOSIS — M545 Low back pain: Secondary | ICD-10-CM | POA: Diagnosis not present

## 2013-07-30 DIAGNOSIS — M545 Low back pain: Secondary | ICD-10-CM | POA: Diagnosis not present

## 2013-08-03 DIAGNOSIS — M545 Low back pain: Secondary | ICD-10-CM | POA: Diagnosis not present

## 2013-08-09 DIAGNOSIS — M545 Low back pain: Secondary | ICD-10-CM | POA: Diagnosis not present

## 2013-08-12 DIAGNOSIS — M545 Low back pain: Secondary | ICD-10-CM | POA: Diagnosis not present

## 2013-08-17 DIAGNOSIS — M545 Low back pain: Secondary | ICD-10-CM | POA: Diagnosis not present

## 2013-08-18 ENCOUNTER — Ambulatory Visit (INDEPENDENT_AMBULATORY_CARE_PROVIDER_SITE_OTHER): Payer: Medicare Other | Admitting: Cardiovascular Disease

## 2013-08-18 ENCOUNTER — Encounter: Payer: Self-pay | Admitting: Cardiovascular Disease

## 2013-08-18 VITALS — BP 100/72 | HR 59 | Resp 16 | Ht 70.0 in | Wt 224.5 lb

## 2013-08-18 DIAGNOSIS — I472 Ventricular tachycardia, unspecified: Secondary | ICD-10-CM

## 2013-08-18 DIAGNOSIS — Z95810 Presence of automatic (implantable) cardiac defibrillator: Secondary | ICD-10-CM | POA: Diagnosis not present

## 2013-08-18 DIAGNOSIS — Z23 Encounter for immunization: Secondary | ICD-10-CM

## 2013-08-18 DIAGNOSIS — I2510 Atherosclerotic heart disease of native coronary artery without angina pectoris: Secondary | ICD-10-CM

## 2013-08-18 DIAGNOSIS — I428 Other cardiomyopathies: Secondary | ICD-10-CM

## 2013-08-18 DIAGNOSIS — I714 Abdominal aortic aneurysm, without rupture, unspecified: Secondary | ICD-10-CM

## 2013-08-18 DIAGNOSIS — E785 Hyperlipidemia, unspecified: Secondary | ICD-10-CM

## 2013-08-18 LAB — ICD DEVICE OBSERVATION
CHARGE TIME: 10.01 s
DEV-0020ICD: NEGATIVE
HV IMPEDENCE: 45 Ohm
RV LEAD IMPEDENCE ICD: 504 Ohm
RV LEAD THRESHOLD: 2.5 V
TZAT-0001SLOWVT: 2
TZAT-0004FASTVT: 8
TZAT-0004SLOWVT: 8
TZAT-0011FASTVT: 10 ms
TZAT-0013FASTVT: 1
TZAT-0013SLOWVT: 3
TZAT-0018SLOWVT: NEGATIVE
TZAT-0018SLOWVT: NEGATIVE
TZON-0003SLOWVT: 400 ms
TZON-0004SLOWVT: 16
TZON-0005SLOWVT: 12
TZST-0001FASTVT: 3
TZST-0001FASTVT: 5
TZST-0001FASTVT: 6
TZST-0001SLOWVT: 4
TZST-0001SLOWVT: 5
TZST-0001SLOWVT: 6
TZST-0003FASTVT: 30 J
TZST-0003FASTVT: 35 J
TZST-0003SLOWVT: 25 J
TZST-0003SLOWVT: 35 J
TZST-0003SLOWVT: 5 J

## 2013-08-18 NOTE — Assessment & Plan Note (Signed)
In the past attempts at discontinuation of a mural have led to recurrent ventricular tachycardia per on the other hand he has not been on a tiny dose of amiodarone without any recurrence and wishes to try to wean off this medication again. Since he has a defibrillator protection this is not unreasonable, although I think it is quite possible we'll have the same outcome. He feels poorly while taking amiodarone and has felt some improvement after reducing the dose several months ago. He is also interested in reducing the beta blocker dose which I agree is contributing to his dizziness. I have told him that I am definitely reluctant to perform too many changes at the same time and will continue the same beta blocker dose for now.

## 2013-08-18 NOTE — Assessment & Plan Note (Signed)
Angiography 2005 shows 30-40% proximal first diagonal artery stenosis, 50% second diagonal artery stenosis, 50% left circumflex artery stenosis, sequential 40% stenoses in the proximal, mid and distal right coronary artery

## 2013-08-18 NOTE — Assessment & Plan Note (Signed)
EF approximately 45% by LV angiography 2005 50% by echo 2005, 45% by nuclear scintigram April 2013. He does not have any clinical signs of hypervolemia/acute heart failure. It should be noted that his nuclear stress test in 2013 probably showed inferior wall scar without ischemia. He is known to have sequential moderate stenoses in his right coronary artery by the angiogram performed in 2005 and it is possible that there has been interval occlusion of the right coronary artery. He prefers noninvasive management. Since he does not have angina or reversible changes this is quite reasonable.

## 2013-08-18 NOTE — Assessment & Plan Note (Signed)
Infrarenal maximum diameter 3.8 cm July of 2013, 3.7 cm September 2014. This is another reason to continue at least low-dose beta blocker

## 2013-08-18 NOTE — Progress Notes (Signed)
Patient ID: William [name], male   DOB: 1928/07/04, 79 y.o.   MRN: [id number]      Reason for office visit Followup ventricular tachycardia, coronary artery disease, hyperlipidemia, AAA   Beni's major complaints continue to be fatigue and weakness. He remains quite inactive. He is often very dizzy when he tries to stand up from a laying or sitting procedure. He has caught himself just short of falling several times including today. We reduced his amiodarone dose at his last appointment and he seems to be feeling a little better. He says he does not have quite as much leg weakness as before. His statin was also discontinued.  Interrogation of his defibrillator shows normal function. He does have a history of sustained ventricular tachycardia in the past when we try to wean off amiodarone he eventually did develop sustained VT. Unfortunately every time amiodarone was restarted he appears to develop fatigue again.   He has a sprint fidelis [phone] defibrillator lead that is under advisory, but it appears to have no abnormalities the  Has only mildly depressed left ventricular systolic function. His nuclear stress test was interpreted as probably showing diaphragmatic attenuation, but he probably does have an inferior wall scar. Previous coronary angiography has shown scattered moderate coronary artery disease, but this was back in 2005. He does not have angina pectoris.  Repeat duplex ultrasonography of his abdominal aortic aneurysm shows no change. It remains small at 3.7 cm in diameter    Allergies  Allergen Reactions  . Crestor [Rosuvastatin]   . Lipitor [Atorvastatin]     Current Outpatient Prescriptions  Medication Sig Dispense Refill  . acetaminophen (TYLENOL) 500 MG tablet Take 500 mg by mouth as needed.      Marland Kitchen amiodarone (PACERONE) 100 MG tablet Take 100 mg by mouth daily.      Marland Kitchen aspirin 81 MG tablet Take 81 mg by mouth daily as needed.       Marland Kitchen ibuprofen (ADVIL,MOTRIN) 200 MG  tablet Take 200 mg by mouth as needed.       . loratadine-pseudoephedrine (CLARITIN-D 12 HOUR) 5-120 MG per tablet Take 1 tablet by mouth daily.      . metoprolol tartrate (LOPRESSOR) 25 MG tablet Take 50 mg by mouth 2 (two) times daily.      . Multiple Minerals-Vitamins (CALCIUM & VIT D3 BONE HEALTH PO) Take 1 tablet by mouth every other day.       . Multiple Vitamin (MULTIVITAMIN) tablet Take 1 tablet by mouth every other day.      Marland Kitchen OVER THE COUNTER MEDICATION Take 2 tablets by mouth at bedtime. Phillips capsules for constipation       No current facility-administered medications for this visit.    Past Medical History  Diagnosis Date  . ICD (implantable cardioverter-defibrillator) in place 08/23/2004    Medtronic  . Ventricular tachycardia   . Hemorrhage     Post-colonostomy  . Hernia   . CHF (congestive heart failure)   . Systolic dysfunction, left ventricle   . AAA (abdominal aortic aneurysm)     7/13 3.8cm  . Dyslipidemia     Past Surgical History  Procedure Laterality Date  . Colon surgery    . Hernia repair    . Nm myocar perf wall motion  01/29/2012    abnormal c/o infarct/scar,no ischemia present  . Cardiac catheterization  08/20/2004    noncritical CAD,mild global hypokinesis, EF 50%    Family History  Problem Relation Age of Onset  .  Heart failure Mother   . Heart failure Father     History   Social History  . Marital Status: Married    Spouse Name: N/A    Number of Children: N/A  . Years of Education: N/A   Occupational History  . Not on file.   Social History Main Topics  . Smoking status: Never Smoker   . Smokeless tobacco: Never Used  . Alcohol Use: 0.0 oz/week    0 Cans of beer per week     Comment: "very Little"  . Drug Use: No  . Sexual Activity: Not on file   Other Topics Concern  . Not on file   Social History Narrative  . No narrative on file    Review of systems: Fatigue, dizziness, weak legs, gait instability. Denies chest  pain, shortness of breath, neurological complaints, palpitations or syncope, intermittent claudication, lower extremity edema, unexplained weight gain, cough, hemoptysis or wheezing.  The patient also denies abdominal pain, nausea, vomiting, dysphagia, diarrhea, constipation, polyuria, polydipsia, dysuria, hematuria, frequency, urgency, abnormal bleeding or bruising, fever, chills, unexpected weight changes, mood swings, change in skin or hair texture, change in voice quality, auditory or visual problems, allergic reactions or rashes, new musculoskeletal complaints other than usual "aches and pains".   PHYSICAL EXAM BP 100/72  Pulse 59  Resp 16  Ht 5\' 10"  (1.778 m)  Wt 224 lb 8 oz (101.833 kg)  BMI 32.21 kg/m2 General: Alert, oriented x3, no distress, obese  Head: no evidence of trauma, PERRL, EOMI, no exophtalmos or lid lag, no myxedema, no xanthelasma; normal ears, nose and oropharynx  Neck: normal jugular venous pulsations and no hepatojugular reflux; brisk carotid pulses without delay and no carotid bruits  Chest: clear to auscultation, no signs of consolidation by percussion or palpation, normal fremitus, symmetrical and full respiratory excursions, of the left subclavian ICD site  Cardiovascular: normal position and quality of the apical impulse, regular rhythm at baseline with very frequent ectopic beats in a pattern of trigeminy, normal first and second heart sounds, no murmurs, rubs or gallops  Abdomen: Protuberant, no tenderness or distention, no masses by palpation, no abnormal pulsatility or arterial bruits, normal bowel sounds, no hepatosplenomegaly  Extremities: no clubbing, cyanosis or edema; 2+ radial, ulnar and brachial pulses bilaterally; 2+ right femoral, posterior tibial and dorsalis pedis pulses; 2+ left femoral, posterior tibial and dorsalis pedis pulses; no subclavian or femoral bruits  Neurological: grossly nonfocal   BMET    Component Value Date/Time   NA 134*  04/12/2013 0557   K 4.2 04/12/2013 0557   CL 101 04/12/2013 0557   CO2 24 04/12/2013 0557   GLUCOSE 192* 04/12/2013 0557   BUN 28* 04/12/2013 0557   CREATININE 1.11 04/12/2013 0557   CALCIUM 8.5 04/12/2013 0557   GFRNONAA 59* 04/12/2013 0557   GFRAA 69* 04/12/2013 0557     ASSESSMENT AND PLAN Sustained VT (ventricular tachycardia) In the past attempts at discontinuation of a mural have led to recurrent ventricular tachycardia per on the other hand he has not been on a tiny dose of amiodarone without any recurrence and wishes to try to wean off this medication again. Since he has a defibrillator protection this is not unreasonable, although I think it is quite possible we'll have the same outcome. He feels poorly while taking amiodarone and has felt some improvement after reducing the dose several months ago. He is also interested in reducing the beta blocker dose which I agree is contributing to his  dizziness. I have told him that I am definitely reluctant to perform too many changes at the same time and will continue the same beta blocker dose for now.  Nonischemic cardiomyopathy EF approximately 45% by LV angiography 2005 50% by echo 2005, 45% by nuclear scintigram April 2013. He does not have any clinical signs of hypervolemia/acute heart failure. It should be noted that his nuclear stress test in 2013 probably showed inferior wall scar without ischemia. He is known to have sequential moderate stenoses in his right coronary artery by the angiogram performed in 2005 and it is possible that there has been interval occlusion of the right coronary artery. He prefers noninvasive management. Since he does not have angina or reversible changes this is quite reasonable.  Coronary atherosclerosis Angiography 2005 shows 30-40% proximal first diagonal artery stenosis, 50% second diagonal artery stenosis, 50% left circumflex artery stenosis, sequential 40% stenoses in the proximal, mid and distal right coronary  artery  AAA (abdominal aortic aneurysm) Infrarenal maximum diameter 3.8 cm July of 2013, 3.7 cm September 2014. This is another reason to continue at least low-dose beta blocker  Hyperlipidemia He seems to be intolerant to multiple statins that over the years have included Lipitor, Crestor and ezetimibe/simvastatin. This is unfortunate since he has established coronary disease and will probably have progression without therapy. We'll try to avoid too many changes in her medications at one time so that we can pinpoint the cause of his fatigue and weakness. We might try to start Zetia monotherapy next year  ICD, sprint fidelis lead under advisory Medtronic single chamber Maximo VR [id number] implanted November 2005; right ventricular lead 6948 sprint fidelis without evidence of complications. Normal device function today. All the parameters are within normal range.  At his request we administered a flu shot today. He is due to have blood work checked when he meets with his primary care physician in a few weeks. We will review his lipid profile off statin at that time.  Orders Placed This Encounter  Procedures  . Flu Vaccine QUAD 36+ mos IM  . ICD Device Observation  . EKG 12-Lead   Meds ordered this encounter  Medications  . amiodarone (PACERONE) 100 MG tablet    Sig: Take 100 mg by mouth daily.  Marland Kitchen acetaminophen (TYLENOL) 500 MG tablet    Sig: Take 500 mg by mouth as needed.  . metoprolol tartrate (LOPRESSOR) 25 MG tablet    Sig: Take 50 mg by mouth 2 (two) times daily.  . Multiple Vitamin (MULTIVITAMIN) tablet    Sig: Take 1 tablet by mouth every other day.  . loratadine-pseudoephedrine (CLARITIN-D 12 HOUR) 5-120 MG per tablet    Sig: Take 1 tablet by mouth daily.    Junious Silk, MD, [facility] [phone] office [phone] pager

## 2013-08-18 NOTE — Patient Instructions (Addendum)
Your physician recommends that you schedule a follow-up appointment in: 3 months Stop amiodarone Please make sure a cholesterol profile is checked with your upcoming labs; if this is not ordered by her primary care physician, let us know so we can order it. Be cautious and take time when sitting up or standing up after lying down for a long period of time. It'll take longer for your blood pressure to adjust.  Flu shot administered today.

## 2013-08-18 NOTE — Assessment & Plan Note (Signed)
Medtronic single chamber Maximo VR [id number] implanted November 2005; right ventricular lead 6948 sprint fidelis without evidence of complications. Normal device function today. All the parameters are within normal range.

## 2013-08-18 NOTE — Assessment & Plan Note (Addendum)
He seems to be intolerant to multiple statins that over the years have included Lipitor, Crestor and ezetimibe/simvastatin. This is unfortunate since he has established coronary disease and will probably have progression without therapy. We'll try to avoid too many changes in her medications at one time so that we can pinpoint the cause of his fatigue and weakness. We might try to start Zetia monotherapy next year

## 2013-08-20 DIAGNOSIS — M545 Low back pain: Secondary | ICD-10-CM | POA: Diagnosis not present

## 2013-08-24 DIAGNOSIS — M545 Low back pain: Secondary | ICD-10-CM | POA: Diagnosis not present

## 2013-08-26 DIAGNOSIS — M545 Low back pain: Secondary | ICD-10-CM | POA: Diagnosis not present

## 2013-08-31 DIAGNOSIS — I10 Essential (primary) hypertension: Secondary | ICD-10-CM | POA: Diagnosis not present

## 2013-08-31 DIAGNOSIS — Z125 Encounter for screening for malignant neoplasm of prostate: Secondary | ICD-10-CM | POA: Diagnosis not present

## 2013-08-31 DIAGNOSIS — E785 Hyperlipidemia, unspecified: Secondary | ICD-10-CM | POA: Diagnosis not present

## 2013-08-31 DIAGNOSIS — R7301 Impaired fasting glucose: Secondary | ICD-10-CM | POA: Diagnosis not present

## 2013-08-31 DIAGNOSIS — M545 Low back pain: Secondary | ICD-10-CM | POA: Diagnosis not present

## 2013-09-02 DIAGNOSIS — M545 Low back pain: Secondary | ICD-10-CM | POA: Diagnosis not present

## 2013-09-07 DIAGNOSIS — M545 Low back pain: Secondary | ICD-10-CM | POA: Diagnosis not present

## 2013-09-14 DIAGNOSIS — I472 Ventricular tachycardia: Secondary | ICD-10-CM | POA: Diagnosis not present

## 2013-09-14 DIAGNOSIS — Z1331 Encounter for screening for depression: Secondary | ICD-10-CM | POA: Diagnosis not present

## 2013-09-14 DIAGNOSIS — R972 Elevated prostate specific antigen [PSA]: Secondary | ICD-10-CM | POA: Diagnosis not present

## 2013-09-14 DIAGNOSIS — M545 Low back pain: Secondary | ICD-10-CM | POA: Diagnosis not present

## 2013-09-14 DIAGNOSIS — Z0000 Encounter for general adult medical examination without abnormal findings: Secondary | ICD-10-CM | POA: Diagnosis not present

## 2013-09-14 DIAGNOSIS — I714 Abdominal aortic aneurysm, without rupture: Secondary | ICD-10-CM | POA: Diagnosis not present

## 2013-09-14 DIAGNOSIS — I10 Essential (primary) hypertension: Secondary | ICD-10-CM | POA: Diagnosis not present

## 2013-09-14 DIAGNOSIS — Z23 Encounter for immunization: Secondary | ICD-10-CM | POA: Diagnosis not present

## 2013-09-14 DIAGNOSIS — R7301 Impaired fasting glucose: Secondary | ICD-10-CM | POA: Diagnosis not present

## 2013-09-14 DIAGNOSIS — Z125 Encounter for screening for malignant neoplasm of prostate: Secondary | ICD-10-CM | POA: Diagnosis not present

## 2013-09-14 DIAGNOSIS — E785 Hyperlipidemia, unspecified: Secondary | ICD-10-CM | POA: Diagnosis not present

## 2013-09-16 DIAGNOSIS — Z1212 Encounter for screening for malignant neoplasm of rectum: Secondary | ICD-10-CM | POA: Diagnosis not present

## 2013-09-16 DIAGNOSIS — M545 Low back pain: Secondary | ICD-10-CM | POA: Diagnosis not present

## 2013-09-28 DIAGNOSIS — M545 Low back pain: Secondary | ICD-10-CM | POA: Diagnosis not present

## 2013-10-12 DIAGNOSIS — N401 Enlarged prostate with lower urinary tract symptoms: Secondary | ICD-10-CM | POA: Diagnosis not present

## 2013-10-12 DIAGNOSIS — N139 Obstructive and reflux uropathy, unspecified: Secondary | ICD-10-CM | POA: Diagnosis not present

## 2013-10-12 DIAGNOSIS — R972 Elevated prostate specific antigen [PSA]: Secondary | ICD-10-CM | POA: Diagnosis not present

## 2013-10-12 DIAGNOSIS — M545 Low back pain: Secondary | ICD-10-CM | POA: Diagnosis not present

## 2013-10-18 DIAGNOSIS — H02109 Unspecified ectropion of unspecified eye, unspecified eyelid: Secondary | ICD-10-CM | POA: Diagnosis not present

## 2013-10-18 DIAGNOSIS — H35369 Drusen (degenerative) of macula, unspecified eye: Secondary | ICD-10-CM | POA: Diagnosis not present

## 2013-10-18 DIAGNOSIS — H52209 Unspecified astigmatism, unspecified eye: Secondary | ICD-10-CM | POA: Diagnosis not present

## 2013-10-18 DIAGNOSIS — H2510 Age-related nuclear cataract, unspecified eye: Secondary | ICD-10-CM | POA: Diagnosis not present

## 2013-10-19 DIAGNOSIS — M545 Low back pain, unspecified: Secondary | ICD-10-CM | POA: Diagnosis not present

## 2013-10-21 DIAGNOSIS — M545 Low back pain, unspecified: Secondary | ICD-10-CM | POA: Diagnosis not present

## 2013-10-26 DIAGNOSIS — M545 Low back pain, unspecified: Secondary | ICD-10-CM | POA: Diagnosis not present

## 2013-10-27 ENCOUNTER — Encounter: Payer: Self-pay | Admitting: *Deleted

## 2013-10-27 ENCOUNTER — Telehealth: Payer: Self-pay | Admitting: Cardiovascular Disease

## 2013-10-27 NOTE — Telephone Encounter (Signed)
FAXED CLEARANCE LETTER TO PAM  #[phone]

## 2013-10-27 NOTE — Telephone Encounter (Signed)
Spoke to [facility]  Prostate Ultrasound and biopsy has not been schedule yet  Would like to hold ASA for at least 5 days for the procedure and need cardiac clearance  Faxed to [phone]  Informed Pam will defer to Dr Sallyanne Kuster-- send her back  the reponsive

## 2013-10-27 NOTE — Telephone Encounter (Signed)
Need clarence for a prostate ultrasound and biopsy please.

## 2013-10-27 NOTE — Telephone Encounter (Signed)
OK to hold aspirin for 5 days

## 2013-10-28 ENCOUNTER — Other Ambulatory Visit: Payer: Self-pay | Admitting: Urology

## 2013-10-28 DIAGNOSIS — M545 Low back pain, unspecified: Secondary | ICD-10-CM | POA: Diagnosis not present

## 2013-11-02 DIAGNOSIS — M545 Low back pain, unspecified: Secondary | ICD-10-CM | POA: Diagnosis not present

## 2013-11-10 DIAGNOSIS — R972 Elevated prostate specific antigen [PSA]: Secondary | ICD-10-CM | POA: Diagnosis not present

## 2013-11-10 DIAGNOSIS — N401 Enlarged prostate with lower urinary tract symptoms: Secondary | ICD-10-CM | POA: Diagnosis not present

## 2013-11-10 DIAGNOSIS — N139 Obstructive and reflux uropathy, unspecified: Secondary | ICD-10-CM | POA: Diagnosis not present

## 2013-11-17 ENCOUNTER — Encounter (HOSPITAL_COMMUNITY): Payer: Self-pay

## 2013-11-17 ENCOUNTER — Encounter (HOSPITAL_COMMUNITY)
Admission: RE | Admit: 2013-11-17 | Discharge: 2013-11-17 | Disposition: A | Discharge status: Home / Self Care | Payer: Medicare Other | Source: Ambulatory Visit | Attending: Urology | Admitting: Urology

## 2013-11-17 ENCOUNTER — Encounter (HOSPITAL_COMMUNITY): Payer: Self-pay | Admitting: Pharmacy Technician

## 2013-11-17 ENCOUNTER — Ambulatory Visit (HOSPITAL_COMMUNITY)
Admission: RE | Admit: 2013-11-17 | Discharge: 2013-11-17 | Disposition: A | Discharge status: Home / Self Care | Payer: Medicare Other | Source: Ambulatory Visit | Attending: Urology | Admitting: Urology

## 2013-11-17 DIAGNOSIS — Z950 Presence of cardiac pacemaker: Secondary | ICD-10-CM | POA: Diagnosis not present

## 2013-11-17 DIAGNOSIS — J9819 Other pulmonary collapse: Secondary | ICD-10-CM | POA: Diagnosis not present

## 2013-11-17 DIAGNOSIS — Z01812 Encounter for preprocedural laboratory examination: Secondary | ICD-10-CM | POA: Insufficient documentation

## 2013-11-17 DIAGNOSIS — I517 Cardiomegaly: Secondary | ICD-10-CM | POA: Diagnosis not present

## 2013-11-17 DIAGNOSIS — Z01818 Encounter for other preprocedural examination: Secondary | ICD-10-CM | POA: Insufficient documentation

## 2013-11-17 DIAGNOSIS — R972 Elevated prostate specific antigen [PSA]: Secondary | ICD-10-CM | POA: Insufficient documentation

## 2013-11-17 LAB — COMPREHENSIVE METABOLIC PANEL
ALBUMIN: 3.5 g/dL (ref 3.5–5.2)
ALT: 18 U/L (ref 0–53)
AST: 24 U/L (ref 0–37)
Alkaline Phosphatase: 78 U/L (ref 39–117)
BUN: 21 mg/dL (ref 6–23)
CALCIUM: 8.9 mg/dL (ref 8.4–10.5)
CO2: 26 mEq/L (ref 19–32)
CREATININE: 1.11 mg/dL (ref 0.50–1.35)
Chloride: 101 mEq/L (ref 96–112)
GFR calc Af Amer: 68 mL/min — ABNORMAL LOW (ref >90)
GFR, EST NON AFRICAN AMERICAN: 59 mL/min — AB (ref >90)
Glucose, Bld: 118 mg/dL — ABNORMAL HIGH (ref 70–99)
Potassium: 4.4 mEq/L (ref 3.7–5.3)
Sodium: 137 mEq/L (ref 137–147)
Total Bilirubin: 0.9 mg/dL (ref 0.3–1.2)
Total Protein: 6.7 g/dL (ref 6.0–8.3)

## 2013-11-17 LAB — CBC
HCT: 40.4 % (ref 39.0–52.0)
Hemoglobin: 14.7 g/dL (ref 13.0–17.0)
MCH: 34.3 pg — AB (ref 26.0–34.0)
MCHC: 36.4 g/dL — ABNORMAL HIGH (ref 30.0–36.0)
MCV: 94.2 fL (ref 78.0–100.0)
Platelets: 139 10*3/uL — ABNORMAL LOW (ref 150–400)
RBC: 4.29 MIL/uL (ref 4.22–5.81)
RDW: 13.6 % (ref 11.5–15.5)
WBC: 6.1 10*3/uL (ref 4.0–10.5)

## 2013-11-17 LAB — APTT: aPTT: 31 seconds (ref 24–37)

## 2013-11-17 NOTE — Patient Instructions (Signed)
William [name]  [birth year]   Your procedure is scheduled on:11-22-13   Report to [facility] at 700 AM.  Call this number if you have problems the morning of surgery: [phone]   Due to [facility] flu policy no visitors under age 78 at this time   Remember:   Do not eat food or drink liquids after midnight.   Take these medicines the morning of surgery with A SIP OF WATER:   Do not wear jewelry, make-up or nail polish.  Do not wear lotions, powders, or perfumes. You may wear deodorant.  Do not shave 48  hours prior to surgery. Men may shave face and neck.  Do not bring valuables to the hospital.  [facility] is not responsible for any belongings or valuables.                 Contacts, dentures or bridgework may not be worn into surgery.  Leave suitcase in the car. After surgery it may be brought to your room.  For patients admitted to the hospital, checkout time is 11:00 AM the day of discharge  .   Patients discharged the day of surgery will not be allowed to drive  home.  Name and phone number of your driver:   Special Instructions:   Shower using CHG 2 nights before surgery and the night before surgery.  If you shower the day of surgery use CHG.  Use special wash - you have one bottle of CHG for all showers.  You should use approximately 1/3 of the bottle for each shower.   Please read over the following fact sheets that you were given: MRSA Information       Questions please call  Karie Chimera RN     [phone]    Patient signature _______________________________________  Nurse signature ________________________________________

## 2013-11-18 DIAGNOSIS — M545 Low back pain, unspecified: Secondary | ICD-10-CM | POA: Diagnosis not present

## 2013-11-19 ENCOUNTER — Encounter: Payer: Self-pay | Admitting: Cardiovascular Disease

## 2013-11-19 ENCOUNTER — Ambulatory Visit (INDEPENDENT_AMBULATORY_CARE_PROVIDER_SITE_OTHER): Payer: Medicare Other | Admitting: Cardiovascular Disease

## 2013-11-19 VITALS — BP 116/70 | HR 74 | Resp 20 | Ht 68.5 in | Wt 220.8 lb

## 2013-11-19 DIAGNOSIS — I428 Other cardiomyopathies: Secondary | ICD-10-CM | POA: Diagnosis not present

## 2013-11-19 DIAGNOSIS — I472 Ventricular tachycardia, unspecified: Secondary | ICD-10-CM

## 2013-11-19 DIAGNOSIS — Z79899 Other long term (current) drug therapy: Secondary | ICD-10-CM

## 2013-11-19 DIAGNOSIS — I4729 Other ventricular tachycardia: Secondary | ICD-10-CM | POA: Diagnosis not present

## 2013-11-19 DIAGNOSIS — R5383 Other fatigue: Secondary | ICD-10-CM | POA: Diagnosis not present

## 2013-11-19 DIAGNOSIS — R5381 Other malaise: Secondary | ICD-10-CM | POA: Diagnosis not present

## 2013-11-19 DIAGNOSIS — T82190A Other mechanical complication of cardiac electrode, initial encounter: Secondary | ICD-10-CM

## 2013-11-19 DIAGNOSIS — I2510 Atherosclerotic heart disease of native coronary artery without angina pectoris: Secondary | ICD-10-CM | POA: Diagnosis not present

## 2013-11-19 DIAGNOSIS — D689 Coagulation defect, unspecified: Secondary | ICD-10-CM

## 2013-11-19 LAB — MDC_IDC_ENUM_SESS_TYPE_INCLINIC
Date Time Interrogation Session: 20150206132956
HIGH POWER IMPEDANCE MEASURED VALUE: 42 Ohm
HIGH POWER IMPEDANCE MEASURED VALUE: 43 Ohm
HIGH POWER IMPEDANCE MEASURED VALUE: 44 Ohm
HIGH POWER IMPEDANCE MEASURED VALUE: 44 Ohm
HIGH POWER IMPEDANCE MEASURED VALUE: 44 Ohm
HIGH POWER IMPEDANCE MEASURED VALUE: 45 Ohm
HIGH POWER IMPEDANCE MEASURED VALUE: 47 Ohm
HIGH POWER IMPEDANCE MEASURED VALUE: 54 Ohm
HIGH POWER IMPEDANCE MEASURED VALUE: 56 Ohm
HIGH POWER IMPEDANCE MEASURED VALUE: 56 Ohm
HIGH POWER IMPEDANCE MEASURED VALUE: 57 Ohm
HighPow Impedance: 42 Ohm
HighPow Impedance: 42 Ohm
HighPow Impedance: 42 Ohm
HighPow Impedance: 43 Ohm
HighPow Impedance: 43 Ohm
HighPow Impedance: 43 Ohm
HighPow Impedance: 43 Ohm
HighPow Impedance: 44 Ohm
HighPow Impedance: 48 Ohm
HighPow Impedance: 55 Ohm
HighPow Impedance: 55 Ohm
HighPow Impedance: 55 Ohm
HighPow Impedance: 55 Ohm
HighPow Impedance: 55 Ohm
HighPow Impedance: 56 Ohm
HighPow Impedance: 56 Ohm
HighPow Impedance: 56 Ohm
HighPow Impedance: 57 Ohm
HighPow Impedance: 57 Ohm
HighPow Impedance: 65 Ohm
Lead Channel Impedance Value: 464 Ohm
Lead Channel Impedance Value: 480 Ohm
Lead Channel Impedance Value: 480 Ohm
Lead Channel Impedance Value: 480 Ohm
Lead Channel Impedance Value: 480 Ohm
Lead Channel Impedance Value: 488 Ohm
Lead Channel Impedance Value: 488 Ohm
Lead Channel Impedance Value: 512 Ohm
Lead Channel Impedance Value: 512 Ohm
Lead Channel Pacing Threshold Amplitude: 2.5 V
Lead Channel Sensing Intrinsic Amplitude: 2.3 mV
Lead Channel Sensing Intrinsic Amplitude: 2.7 mV
Lead Channel Sensing Intrinsic Amplitude: 2.7 mV
Lead Channel Sensing Intrinsic Amplitude: 2.8 mV
Lead Channel Sensing Intrinsic Amplitude: 2.9 mV
Lead Channel Sensing Intrinsic Amplitude: 3 mV
Lead Channel Sensing Intrinsic Amplitude: 3.2 mV
Lead Channel Sensing Intrinsic Amplitude: 3.3 mV
Lead Channel Setting Pacing Amplitude: 2.5 V
MDC IDC MSMT BATTERY VOLTAGE: 2.62 V
MDC IDC MSMT LEADCHNL RV IMPEDANCE VALUE: 480 Ohm
MDC IDC MSMT LEADCHNL RV IMPEDANCE VALUE: 480 Ohm
MDC IDC MSMT LEADCHNL RV IMPEDANCE VALUE: 480 Ohm
MDC IDC MSMT LEADCHNL RV IMPEDANCE VALUE: 480 Ohm
MDC IDC MSMT LEADCHNL RV IMPEDANCE VALUE: 488 Ohm
MDC IDC MSMT LEADCHNL RV IMPEDANCE VALUE: 504 Ohm
MDC IDC MSMT LEADCHNL RV PACING THRESHOLD PULSEWIDTH: 0.1 ms
MDC IDC MSMT LEADCHNL RV SENSING INTR AMPL: 2.2 mV
MDC IDC MSMT LEADCHNL RV SENSING INTR AMPL: 2.7 mV
MDC IDC MSMT LEADCHNL RV SENSING INTR AMPL: 2.7 mV
MDC IDC MSMT LEADCHNL RV SENSING INTR AMPL: 2.7 mV
MDC IDC MSMT LEADCHNL RV SENSING INTR AMPL: 2.7 mV
MDC IDC MSMT LEADCHNL RV SENSING INTR AMPL: 2.8 mV
MDC IDC MSMT LEADCHNL RV SENSING INTR AMPL: 3.3 mV
MDC IDC SET LEADCHNL RV PACING PULSEWIDTH: 0.4 ms
MDC IDC SET LEADCHNL RV SENSING SENSITIVITY: 0.3 mV
MDC IDC SET ZONE DETECTION INTERVAL: 400 ms
MDC IDC STAT BRADY RV PERCENT PACED: 0 %
Zone Setting Detection Interval: 240 ms
Zone Setting Detection Interval: 300 ms

## 2013-11-19 LAB — ICD DEVICE OBSERVATION

## 2013-11-19 NOTE — Patient Instructions (Addendum)
Restart amiodarone 200 mg a day for the first 2 weeks, then reduce to 100 mg daily.  Dr. Sallyanne Kuster will replace the generator to your defibrillator along with a lead placement.  This will be scheduled at [facility].  Your physician recommends that you return for lab work in: within 5-7 before the procedure.

## 2013-11-22 ENCOUNTER — Ambulatory Visit (HOSPITAL_COMMUNITY): Payer: Medicare Other | Admitting: Registered Nurse

## 2013-11-22 ENCOUNTER — Ambulatory Visit (HOSPITAL_COMMUNITY)
Admission: RE | Admit: 2013-11-22 | Discharge: 2013-11-22 | Disposition: A | Discharge status: Home / Self Care | Payer: Medicare Other | Source: Ambulatory Visit | Attending: Urology | Admitting: Urology

## 2013-11-22 ENCOUNTER — Encounter (HOSPITAL_COMMUNITY): Payer: Medicare Other | Admitting: Registered Nurse

## 2013-11-22 ENCOUNTER — Encounter (HOSPITAL_COMMUNITY): Payer: Self-pay | Admitting: *Deleted

## 2013-11-22 ENCOUNTER — Encounter (HOSPITAL_COMMUNITY)
Admission: RE | Disposition: A | Discharge status: Home / Self Care | Payer: Self-pay | Source: Ambulatory Visit | Attending: Urology

## 2013-11-22 DIAGNOSIS — M129 Arthropathy, unspecified: Secondary | ICD-10-CM | POA: Diagnosis not present

## 2013-11-22 DIAGNOSIS — Z9089 Acquired absence of other organs: Secondary | ICD-10-CM | POA: Diagnosis not present

## 2013-11-22 DIAGNOSIS — G4730 Sleep apnea, unspecified: Secondary | ICD-10-CM | POA: Insufficient documentation

## 2013-11-22 DIAGNOSIS — I10 Essential (primary) hypertension: Secondary | ICD-10-CM | POA: Insufficient documentation

## 2013-11-22 DIAGNOSIS — Z96649 Presence of unspecified artificial hip joint: Secondary | ICD-10-CM | POA: Insufficient documentation

## 2013-11-22 DIAGNOSIS — R972 Elevated prostate specific antigen [PSA]: Secondary | ICD-10-CM | POA: Diagnosis not present

## 2013-11-22 DIAGNOSIS — C61 Malignant neoplasm of prostate: Secondary | ICD-10-CM | POA: Insufficient documentation

## 2013-11-22 DIAGNOSIS — K648 Other hemorrhoids: Secondary | ICD-10-CM | POA: Insufficient documentation

## 2013-11-22 DIAGNOSIS — N419 Inflammatory disease of prostate, unspecified: Secondary | ICD-10-CM | POA: Diagnosis not present

## 2013-11-22 DIAGNOSIS — I509 Heart failure, unspecified: Secondary | ICD-10-CM | POA: Diagnosis not present

## 2013-11-22 DIAGNOSIS — Z79899 Other long term (current) drug therapy: Secondary | ICD-10-CM | POA: Diagnosis not present

## 2013-11-22 DIAGNOSIS — N401 Enlarged prostate with lower urinary tract symptoms: Secondary | ICD-10-CM | POA: Insufficient documentation

## 2013-11-22 DIAGNOSIS — K5900 Constipation, unspecified: Secondary | ICD-10-CM | POA: Diagnosis not present

## 2013-11-22 DIAGNOSIS — N138 Other obstructive and reflux uropathy: Secondary | ICD-10-CM | POA: Insufficient documentation

## 2013-11-22 DIAGNOSIS — N139 Obstructive and reflux uropathy, unspecified: Secondary | ICD-10-CM | POA: Diagnosis not present

## 2013-11-22 HISTORY — PX: PROSTATE BIOPSY: SHX241

## 2013-11-22 SURGERY — BIOPSY, PROSTATE
Anesthesia: General

## 2013-11-22 MED ORDER — LIDOCAINE HCL (CARDIAC) 20 MG/ML IV SOLN
INTRAVENOUS | Status: AC
  Filled 2013-11-22: qty 5

## 2013-11-22 MED ORDER — LIDOCAINE HCL (CARDIAC) 20 MG/ML IV SOLN
INTRAVENOUS | Status: DC | PRN
  Administered 2013-11-22: 80 mg via INTRAVENOUS

## 2013-11-22 MED ORDER — EPHEDRINE SULFATE 50 MG/ML IJ SOLN
INTRAMUSCULAR | Status: AC
  Filled 2013-11-22: qty 1

## 2013-11-22 MED ORDER — FENTANYL CITRATE 0.05 MG/ML IJ SOLN: 25.0000 ug | INTRAMUSCULAR | Status: DC | PRN

## 2013-11-22 MED ORDER — LACTATED RINGERS IV SOLN
INTRAVENOUS | Status: DC | PRN
  Administered 2013-11-22: 08:00:00 via INTRAVENOUS

## 2013-11-22 MED ORDER — PROPOFOL 10 MG/ML IV BOLUS
INTRAVENOUS | Status: AC
  Filled 2013-11-22: qty 20

## 2013-11-22 MED ORDER — LIDOCAINE HCL 2 % EX GEL
CUTANEOUS | Status: AC
  Filled 2013-11-22: qty 10

## 2013-11-22 MED ORDER — PROPOFOL 10 MG/ML IV BOLUS
INTRAVENOUS | Status: DC | PRN
Start: 2013-11-22 — End: 2013-11-22
  Administered 2013-11-22: 180 mg via INTRAVENOUS

## 2013-11-22 MED ORDER — EPHEDRINE SULFATE 50 MG/ML IJ SOLN
INTRAMUSCULAR | Status: DC | PRN
  Administered 2013-11-22: 5 mg via INTRAVENOUS

## 2013-11-22 MED ORDER — LIDOCAINE HCL 2 % EX GEL
CUTANEOUS | Status: DC | PRN
  Administered 2013-11-22: 1

## 2013-11-22 MED ORDER — ONDANSETRON HCL 4 MG/2ML IJ SOLN
INTRAMUSCULAR | Status: AC
  Filled 2013-11-22: qty 2

## 2013-11-22 MED ORDER — ESMOLOL HCL 10 MG/ML IV SOLN
INTRAVENOUS | Status: AC
  Filled 2013-11-22: qty 10

## 2013-11-22 MED ORDER — LIDOCAINE HCL 2 % IJ SOLN
INTRAMUSCULAR | Status: AC
  Filled 2013-11-22: qty 20

## 2013-11-22 MED ORDER — FENTANYL CITRATE 0.05 MG/ML IJ SOLN
INTRAMUSCULAR | Status: DC | PRN
  Administered 2013-11-22: 50 ug via INTRAVENOUS

## 2013-11-22 MED ORDER — LEVOFLOXACIN 500 MG PO TABS: 500.0000 mg | ORAL_TABLET | Freq: Every day | ORAL | Status: DC

## 2013-11-22 MED ORDER — LACTATED RINGERS IV SOLN: INTRAVENOUS | Status: DC

## 2013-11-22 MED ORDER — SODIUM CHLORIDE 0.9 % IJ SOLN
INTRAMUSCULAR | Status: AC
  Filled 2013-11-22: qty 10

## 2013-11-22 MED ORDER — FENTANYL CITRATE 0.05 MG/ML IJ SOLN
INTRAMUSCULAR | Status: AC
  Filled 2013-11-22: qty 2

## 2013-11-22 MED ORDER — DEXTROSE 5 % IV SOLN
1.0000 g | Freq: Once | INTRAVENOUS | Status: AC
  Administered 2013-11-22: 2 g via INTRAVENOUS
  Filled 2013-11-22: qty 10

## 2013-11-22 SURGICAL SUPPLY — 3 items
GLOVE BIOGEL M STRL SZ7.5 (GLOVE) ×1 IMPLANT
GOWN STRL REUS W/TWL XL LVL3 (GOWN DISPOSABLE) ×1 IMPLANT
SCRUB PCMX 4 OZ (MISCELLANEOUS) ×1 IMPLANT

## 2013-11-22 NOTE — H&P (Signed)
ctive Problems Problems  1. Benign localized hyperplasia of prostate with urinary obstruction (818.29,937.16) 2. Elevated prostate specific antigen (PSA) (790.93)  History of Present Illness     80 yo male returns today to discuss PSA results & prostate biopsy that is scheduled on 11/22/12 as an outpatient procedure due to patient wasn't able to tolerate rectal exam. Originally referred by Dr. Osborne Casco for further evaluation of an elevated PSA of 6.134 on 08/31/13. He states he has noticed a change in his urinary habits as well.    1. chronic constipation: Reita Chard MOM + prn stool softeners each day in order to have daily bowel movements.   2. Severe internal hemorrhoids: bleed easily. Cannot allow finger rectal exam.   3. PSA elevation: His psa has risen from 4.2 in 2013, and 4.148 i May, 2014; but psa now 6.134. Pt rides an exercise bicycle for therapy for his back fracture L-1. IPSS=10.  4. Sleep apnea    10/12/13 PSA - 5.02/10%  03/02/13 PSA - 4.148  08/20/12 PSA - 4.208   Past Medical History Problems  1. History of arthritis (V13.4) 2. History of cardiac arrhythmia (V12.59) 3. History of diverticulitis of colon (V12.79) 4. History of hypertension (V12.59) 5. History of sleep apnea (V13.89)  Surgical History Problems  1. History of Appendectomy 2. History of Back Surgery 3. History of Breast Surgery 4. History of Colon Surgery 5. History of Complete Colonoscopy 6. History of Inguinal Hernia Repair 7. History of Total Hip Replacement  Current Meds 1. Claritin CAPS;  Therapy: (Recorded:30Dec2014) to Recorded 2. Metoprolol Tartrate 25 MG Oral Tablet; TAKE 1 TABLET TWICE DAILY;  Therapy: (Recorded:30Dec2014) to Recorded 3. Motrin TABS;  Therapy: (Recorded:30Dec2014) to Recorded 4. Vytorin TABS;  Therapy: (Recorded:30Dec2014) to Recorded  Allergies No Known Allergies  1. No Known Allergies  Family History Problems  1. Family history of cerebral  infarction (V17.1) : Mother, Father 2. Family history of colon cancer (V16.0)  Social History Problems  1. Alcohol use   1-2 glasses/week 2. Caffeine use (V49.89)   5 or 6 per day 3. Father deceased 89. Married 5. Never a smoker (V49.89) 6. Number of children   2 daughters  Review of Systems  Genitourinary: urinary frequency, feelings of urinary urgency, nocturia, difficulty starting the urinary stream and weak urinary stream, but no dysuria, no incontinence, urinary stream does not start and stop, no incomplete emptying of bladder, no post-void dribbling and initiating urination does not require straining.  Gastrointestinal: constipation, but no nausea, no vomiting, no heartburn and no diarrhea.  Constitutional: feeling tired (fatigue) and recent ~Ulb weight loss, but no fever and no night sweats.  Integumentary: no new skin rashes or lesions and no pruritus.  Eyes: no blurred vision and no diplopia.  ENT: no sore throat and no sinus problems.  Hematologic/Lymphatic: a tendency to easily bruise, but no swollen glands.  Cardiovascular: no chest pain and no leg swelling.  Respiratory: no shortness of breath and no cough.  Endocrine: no polydipsia.  Musculoskeletal: back pain, but no joint pain.  Neurological: no headache and no dizziness.  Psychiatric: no anxiety and no depression.    Assessment Assessed  1. Benign localized hyperplasia of prostate with urinary obstruction (967.89,381.01) 2. Elevated prostate specific antigen (PSA) (790.93)  42minute discussion with patient, wife and daughter, re: upcoming prostate biopsy, under LMA anesthesia at [facility]. We have discussed the psa and psa-2 tests. We have discussed biopsy vs no biopsy and his ECOG score. Pt insists that  he wants to know if he has prostate caner. Note that I could not perform rectal exam because of his tight rectum and pain with attempted rectal exam. He is a patient of Dr. Winnifred Friar and will make an  appointment with Dr. Sharlett Iles for evaluation.    I have answered all of his family's questions, and will proceed with biopsy at [facility] with general LMA anesthesia. Mrs. Schimming has reviewed the [facility] website, and we have answered all he questions which she has written down.   Plan Continue with plans for PBx.   Discussion/Summary cc: Domenick Gong, MD     Signatures Electronically signed by : Carolan Clines, M.D.; Nov 10 2013  5:16PM EST

## 2013-11-22 NOTE — Anesthesia Postprocedure Evaluation (Signed)
Anesthesia Post Note  Patient: William [name]  Procedure(s) Performed: Procedure(s) (LRB): PROSTATE BIOPSY AND ULTRASOUND (N/A)  Anesthesia type: General  Patient location: PACU  Post pain: Pain level controlled  Post assessment: Post-op Vital signs reviewed  Last Vitals:  Filed Vitals:   11/22/13 1108  BP: 143/67  Pulse: 67  Temp: 36.6 C  Resp: 18    Post vital signs: Reviewed  Level of consciousness: sedated  Complications: No apparent anesthesia complications

## 2013-11-22 NOTE — Transfer of Care (Signed)
Immediate Anesthesia Transfer of Care Note  Patient: William [name]  Procedure(s) Performed: Procedure(s): PROSTATE BIOPSY AND ULTRASOUND (N/A)  Patient Location: PACU  Anesthesia Type:General  Level of Consciousness: awake, alert , oriented and patient cooperative  Airway & Oxygen Therapy: Patient Spontanous Breathing and Patient connected to face mask oxygen  Post-op Assessment: Report given to PACU RN, Post -op Vital signs reviewed and stable and Patient moving all extremities  Post vital signs: Reviewed and stable  Complications: No apparent anesthesia complications

## 2013-11-22 NOTE — Anesthesia Preprocedure Evaluation (Addendum)
Anesthesia Evaluation  Patient identified by MRN, date of birth, ID band Patient awake    Reviewed: Allergy & Precautions, H&P , NPO status , Patient's Chart, lab work & pertinent test results  Airway Mallampati: II TM Distance: >3 FB Neck ROM: Full    Dental  (+) Chipped and Dental Advisory Given,    Pulmonary neg pulmonary ROS, sleep apnea ,  breath sounds clear to auscultation  Pulmonary exam normal       Cardiovascular hypertension, Pt. on medications and Pt. on home beta blockers + CAD, + Peripheral Vascular Disease (AAA) and +CHF negative cardio ROS  + dysrhythmias Ventricular Tachycardia + Cardiac Defibrillator Rhythm:Regular Rate:Normal  Sustained VT (ventricular tachycardia) In the past attempts at discontinuation of a mural have led to recurrent ventricular tachycardia per on the other hand he has not been on a tiny dose of amiodarone without any recurrence and wishes to try to wean off this medication again. Since he has a defibrillator protection this is not unreasonable, although I think it is quite possible we'll have the same outcome. He feels poorly while taking amiodarone and has felt some improvement after reducing the dose several months ago. He is also interested in reducing the beta blocker dose which I agree is contributing to his dizziness. I have told him that I am definitely reluctant to perform too many changes at the same time and will continue the same beta blocker dose for now.   Nonischemic cardiomyopathy EF approximately 45% by LV angiography 2005 50% by echo 2005, 45% by nuclear scintigram April 2013. He does not have any clinical signs of hypervolemia/acute heart failure. It should be noted that his nuclear stress test in 2013 probably showed inferior wall scar without ischemia. He is known to have sequential moderate stenoses in his right coronary artery by the angiogram performed in 2005 and it is possible  that there has been interval occlusion of the right coronary artery. He prefers noninvasive management. Since he does not have angina or reversible changes this is quite reasonable.   Coronary atherosclerosis Angiography 2005 shows 30-40% proximal first diagonal artery stenosis, 50% second diagonal artery stenosis, 50% left circumflex artery stenosis, sequential 40% stenoses in the proximal, mid and distal right coronary artery   Neuro/Psych negative neurological ROS  negative psych ROS   GI/Hepatic negative GI ROS, Neg liver ROS,   Endo/Other  negative endocrine ROS  Renal/GU negative Renal ROS  negative genitourinary   Musculoskeletal negative musculoskeletal ROS (+)   Abdominal   Peds  Hematology negative hematology ROS (+)   Anesthesia Other Findings   Reproductive/Obstetrics negative OB ROS                          Anesthesia Physical Anesthesia Plan  ASA: III  Anesthesia Plan: General   Post-op Pain Management:    Induction: Intravenous  Airway Management Planned: LMA  Additional Equipment:   Intra-op Plan:   Post-operative Plan: Extubation in OR  Informed Consent: I have reviewed the patients History and Physical, chart, labs and discussed the procedure including the risks, benefits and alternatives for the proposed anesthesia with the patient or authorized representative who has indicated his/her understanding and acceptance.   Dental advisory given  Plan Discussed with: CRNA  Anesthesia Plan Comments:         Anesthesia Quick Evaluation

## 2013-11-22 NOTE — Interval H&P Note (Signed)
History and Physical Interval Note:  11/22/2013 8:42 AM  William [name]  has presented today for surgery, with the diagnosis of elevated PSA  The various methods of treatment have been discussed with the patient and family. After consideration of risks, benefits and other options for treatment, the patient has consented to  Procedure(s): PROSTATE BIOPSY AND ULTRASOUND (N/A) as a surgical intervention .  The patient's history has been reviewed, patient examined, no change in status, stable for surgery.  I have reviewed the patient's chart and labs.  Questions were answered to the patient's satisfaction.     Carolan Clines I

## 2013-11-22 NOTE — Op Note (Signed)
Pre-operative diagnosis :  PSA elevation  Postoperative diagnosis:  Same  Operation: Transrectal ultrasound and prostate and biopsies of prostate (12).  Surgeon:  Chauncey Cruel. Gaynelle Arabian, MD  First assistant:  None  Anesthesia:  General LMA  Preparation:  After appropriate preanesthesia, the patient is brought the operating room, placed in the upper table the dorsal supine position where general LMA anesthesia was introduced. He was then replaced in the left lateral decubitus position while on the gurney, and the rectum was prepped with Betadine solution and draped in usual fashion.  Review history:  Problems  1. Benign localized hyperplasia of prostate with urinary obstruction (161.09,604.54)  2. Elevated prostate specific antigen (PSA) (790.93)  History of Present Illness  80 yo male returns today to discuss PSA results & prostate biopsy that is scheduled on 11/22/12 as an outpatient procedure due to patient wasn't able to tolerate rectal exam. Originally referred by Dr. Osborne Casco for further evaluation of an elevated PSA of 6.134 on 08/31/13. He states he has noticed a change in his urinary habits as well.  1. chronic constipation: Reita Chard MOM + prn stool softeners each day in order to have daily bowel movements.  2. Severe internal hemorrhoids: bleed easily. Cannot allow finger rectal exam.  3. PSA elevation: His psa has risen from 4.2 in 2013, and 4.148 i May, 2014; but psa now 6.134. Pt rides an exercise bicycle for therapy for his back fracture L-1. IPSS=10.  4. Sleep apnea  10/12/13 PSA - 5.02/10%  03/02/13 PSA - 4.148   Statement of  Likelihood of Success: Excellent. TIME-OUT observed.:  Procedure:  20 cc of Xylocaine gel was placed within the rectum. Rectal examination was accomplished, and was noted that the patient had circumferential scar and tightening of the anus. No hemorrhoids were identified, however. Following digital dilation of the anus, the ultrasound probe was placed in  the rectal vault, and the prostate was imaged. The prostate was noted to be 20 cc of volume.  12 separate biopsies were then obtained in standard fashion, from the right posterior mid and lateral areas, the right mid medial and lateral areas, and the right apex medial and lateral areas. The probe was then used to identify the left side of the prostate, and biopsies were taken from the left posterior mid and lateral areas, the left mid medial and lateral areas, and the left apex mid and lateral areas.  Following prostate biopsies, no bleeding was noted. Gel was clean, the patient was replaced in the supine position. The patient was awakened and taken to recovery room in good condition.

## 2013-11-22 NOTE — Preoperative (Signed)
Beta Blockers   Reason not to administer Beta Blockers:Metoprolol taken 0630 at 11-22-13

## 2013-11-22 NOTE — Discharge Instructions (Signed)
Prostate Biopsy TRUS Biopsy BEFORE THE TEST   Do not take aspirin. Do not take any medicine that has aspirin in it 7 days before your biopsy.  You may be given a medicine to take on the day of your biopsy.  You may also be given a medicine or treatment to help you go poop (laxative or enema). AFTER THE TEST  Only take medicine as told by your doctor.  It is normal to have some bleeding from your rectum for the first 5 days.  You may have blood in your pee (urine) or sperm. Finding out the results of your test Ask when your test results will be ready. Make sure you get your test results. GET HELP RIGHT AWAY IF:  You have a temperature by mouth above 102 F (38.9 C), not controlled by medicine.  You have blood in your pee for more than 5 days.  You have a lot of blood in your pee.  You have bleeding from your rectum for more than 5 days or have a lot of blood in your poop (feces).  You have severe pain. Document Released: 09/18/2009 Document Revised: 12/23/2011 Document Reviewed: 05/19/2013 [facility] Patient Information 2014 [place], [place].

## 2013-11-23 ENCOUNTER — Encounter (HOSPITAL_COMMUNITY): Payer: Self-pay | Admitting: Urology

## 2013-11-25 ENCOUNTER — Encounter: Payer: Self-pay | Admitting: Cardiovascular Disease

## 2013-11-25 NOTE — Progress Notes (Signed)
Patient ID: William [name], male   DOB: [birth year], 80 y.o.   MRN: [id number]      Reason for office visit Nonischemic cardiomyopathy, recurrent sustained VT, ICD check  [name] today for a followup ICD check after we tried to wean him off amiodarone antiarrhythmic therapy. As has occurred at least on 2 occasions in the past, within a couple of weeks of discontinuing amiodarone he developed very frequent episodes of nonsustained ventricular tachycardia. Lungs episode lasted for only 6 beats total of 66 episodes have been reported just in the last couple of weeks . He has had sustained VT in the past on several occasions. He was unaware of the palpitations.   His defibrillator is a Medtronic Maximo with a sprint fidelis [phone] lead that is under advisory and has also had a gradually diminishing R wave sensing. His generator has just reached ERI.  Has only mildly depressed left ventricular systolic function. His nuclear stress test was interpreted as probably showing diaphragmatic attenuation, but he probably does have an inferior wall scar. Previous coronary angiography has shown scattered moderate coronary artery disease, but this was back in 2005. He does not have angina pectoris.  Repeat duplex ultrasonography of his abdominal aortic aneurysm shows no change. It remains small at 3.7 cm in diameter   Allergies  Allergen Reactions  . Crestor [Rosuvastatin]     Hurts muscles  . Lipitor [Atorvastatin]     Hurts stomach    Current Outpatient Prescriptions  Medication Sig Dispense Refill  . acetaminophen (TYLENOL) 500 MG tablet Take 500 mg by mouth every 6 (six) hours as needed for mild pain or moderate pain.       Marland Kitchen docusate sodium (PHILLIPS STOOL SOFTENER) 100 MG capsule Take 200 mg by mouth at bedtime.      Marland Kitchen ezetimibe-simvastatin (VYTORIN) 10-20 MG per tablet Take 1 tablet by mouth at bedtime.      Marland Kitchen loratadine-pseudoephedrine (CLARITIN-D 12 HOUR) 5-120 MG per tablet Take 1 tablet  by mouth daily as needed for allergies.       . metoprolol tartrate (LOPRESSOR) 25 MG tablet Take 50 mg by mouth 2 (two) times daily.      Marland Kitchen amiodarone (PACERONE) 200 MG tablet Take 200 mg by mouth daily.      Marland Kitchen levofloxacin (LEVAQUIN) 500 MG tablet Take 1 tablet (500 mg total) by mouth daily.  1 tablet  0   No current facility-administered medications for this visit.    Past Medical History  Diagnosis Date  . ICD (implantable cardioverter-defibrillator) in place 08/23/2004    Medtronic  . Ventricular tachycardia   . Hemorrhage     Post-colonostomy  . Hernia   . CHF (congestive heart failure)   . Systolic dysfunction, left ventricle   . AAA (abdominal aortic aneurysm)     7/13 3.8cm  . Dyslipidemia     Past Surgical History  Procedure Laterality Date  . Colon surgery    . Hernia repair    . Nm myocar perf wall motion  01/29/2012    abnormal c/o infarct/scar,no ischemia present  . Cardiac catheterization  08/20/2004    noncritical CAD,mild global hypokinesis, EF 50%  . Prostate biopsy N/A 11/22/2013    Procedure: PROSTATE BIOPSY AND ULTRASOUND;  Surgeon: Ailene Rud, MD;  Location: [facility];  Service: Urology;  Laterality: N/A;    Family History  Problem Relation Age of Onset  . Heart failure Mother   . Heart failure Father  History   Social History  . Marital Status: Married    Spouse Name: N/A    Number of Children: N/A  . Years of Education: N/A   Occupational History  . Not on file.   Social History Main Topics  . Smoking status: Never Smoker   . Smokeless tobacco: Never Used  . Alcohol Use: 0.0 oz/week    0 Cans of beer per week     Comment: "very Little"  . Drug Use: No  . Sexual Activity: Not on file   Other Topics Concern  . Not on file   Social History Narrative  . No narrative on file    Review of systems: Fatigue, dizziness, weak legs, gait instability. Denies chest pain, shortness of breath, neurological complaints, palpitations or  syncope, intermittent claudication, lower extremity edema, unexplained weight gain, cough, hemoptysis or wheezing.  The patient also denies abdominal pain, nausea, vomiting, dysphagia, diarrhea, constipation, polyuria, polydipsia, dysuria, hematuria, frequency, urgency, abnormal bleeding or bruising, fever, chills, unexpected weight changes, mood swings, change in skin or hair texture, change in voice quality, auditory or visual problems, allergic reactions or rashes, new musculoskeletal complaints other than usual "aches and pains".    PHYSICAL EXAM BP 116/70  Pulse 74  Resp 20  Ht 5' 8.5" (1.74 m)  Wt 100.154 kg (220 lb 12.8 oz)  BMI 33.08 kg/m2 General: Alert, oriented x3, no distress, obese  Head: no evidence of trauma, PERRL, EOMI, no exophtalmos or lid lag, no myxedema, no xanthelasma; normal ears, nose and oropharynx  Neck: normal jugular venous pulsations and no hepatojugular reflux; brisk carotid pulses without delay and no carotid bruits  Chest: clear to auscultation, no signs of consolidation by percussion or palpation, normal fremitus, symmetrical and full respiratory excursions, of the left subclavian ICD site  Cardiovascular: normal position and quality of the apical impulse, regular rhythm at baseline with very frequent ectopic beats in a pattern of trigeminy, normal first and second heart sounds, no murmurs, rubs or gallops  Abdomen: Protuberant, no tenderness or distention, no masses by palpation, no abnormal pulsatility or arterial bruits, normal bowel sounds, no hepatosplenomegaly  Extremities: no clubbing, cyanosis or edema; 2+ radial, ulnar and brachial pulses bilaterally; 2+ right femoral, posterior tibial and dorsalis pedis pulses; 2+ left femoral, posterior tibial and dorsalis pedis pulses; no subclavian or femoral bruits  Neurological: grossly nonfocal   EKG: Sinus rhythm, first-degree AV block, left atrial abnormality, left axis deviation, right bundle branch block and  left anterior fascicular block, no acute repolarization abnormalities  BMET    Component Value Date/Time   NA 137 11/17/2013 1400   K 4.4 11/17/2013 1400   CL 101 11/17/2013 1400   CO2 26 11/17/2013 1400   GLUCOSE 118* 11/17/2013 1400   BUN 21 11/17/2013 1400   CREATININE 1.11 11/17/2013 1400   CALCIUM 8.9 11/17/2013 1400   GFRNONAA 59* 11/17/2013 1400   GFRAA 68* 11/17/2013 1400     ASSESSMENT AND PLAN  It is time to schedule a defibrillator generator change out but also placement of a new right ventricular defibrillator lead since his old lead is under advisory for possible lead fracture and has poor R wave sensing as well. Since Mr. Garrabrant is now 80 years old I would also consider placement of a new right atrial lead in case he develops need for atrial pacing or advanced heart block (he has bifascicular block). He does not have signs or symptoms of congestive heart failure and despite his broad  QRS complex does not meet indications for CRT.  I've also recommended that he resume the amiodarone therapy since he clearly has a substantial increase in ventricular arrhythmia not long after we discontinued it.  Patient Instructions  Restart amiodarone 200 mg a day for the first 2 weeks, then reduce to 100 mg daily.  Dr. Sallyanne Kuster will replace the generator to your defibrillator along with a lead placement.  This will be scheduled at [facility].  Your physician recommends that you return for lab work in: within 5-7 before the procedure.         Orders Placed This Encounter  Procedures  . CBC  . Comp Met (CMET)  . Protime-INR  . PTT  . TSH  . Implantable device check  . EKG 12-Lead  . IMPLANTABLE CARDIOVERTER DEFIBRILLATOR (ICD) GENERATOR CHANGE   No orders of the defined types were placed in this encounter.    Holli Humbles, MD, [place] [phone] office [phone] pager

## 2013-12-06 ENCOUNTER — Telehealth: Payer: Self-pay | Admitting: Cardiovascular Disease

## 2013-12-06 ENCOUNTER — Other Ambulatory Visit: Payer: Self-pay | Admitting: *Deleted

## 2013-12-06 DIAGNOSIS — C61 Malignant neoplasm of prostate: Secondary | ICD-10-CM | POA: Diagnosis not present

## 2013-12-06 MED ORDER — AMIODARONE HCL 200 MG PO TABS: ORAL_TABLET | ORAL | Status: DC

## 2013-12-06 NOTE — Telephone Encounter (Signed)
William [name] is taking Amiodarone 100mg  and [employer] states that they only have the 200mg   In their formulary. Would like to know if a prescription for the 200mg  be written and William [name] states that she will cut the pill in half for him .Marland Kitchen   Please call if you have any questions..  Thanks

## 2013-12-06 NOTE — Telephone Encounter (Signed)
primemail contacted this AM.  Amiodarone is covered for the 200mg  tabs.  LM this has been approved and he'll take 1/2 tablet daily.  Asked that they call me back if they have any questions.

## 2013-12-06 NOTE — Telephone Encounter (Signed)
LMTC to verify mg's of current Amiodarone tablets.  Insurance will cover 200mg  tablets of Amiodarone 1/2 tablet daily but not 100mg  tablets.  Rx switched in Epic and new Rx has been sent to pharmacy.

## 2013-12-07 DIAGNOSIS — C61 Malignant neoplasm of prostate: Secondary | ICD-10-CM | POA: Diagnosis not present

## 2013-12-15 ENCOUNTER — Other Ambulatory Visit: Payer: Self-pay | Admitting: *Deleted

## 2013-12-15 NOTE — Telephone Encounter (Signed)
Amiodarone was authorized at 200mg  1/2 tablet daily at $9.

## 2013-12-17 ENCOUNTER — Other Ambulatory Visit: Payer: Self-pay | Admitting: *Deleted

## 2013-12-17 DIAGNOSIS — I428 Other cardiomyopathies: Secondary | ICD-10-CM

## 2013-12-17 DIAGNOSIS — Z4502 Encounter for adjustment and management of automatic implantable cardiac defibrillator: Secondary | ICD-10-CM

## 2013-12-21 DIAGNOSIS — N401 Enlarged prostate with lower urinary tract symptoms: Secondary | ICD-10-CM | POA: Diagnosis not present

## 2013-12-21 DIAGNOSIS — C61 Malignant neoplasm of prostate: Secondary | ICD-10-CM | POA: Diagnosis not present

## 2013-12-31 ENCOUNTER — Other Ambulatory Visit: Payer: Self-pay | Admitting: Urology

## 2014-01-05 DIAGNOSIS — D689 Coagulation defect, unspecified: Secondary | ICD-10-CM | POA: Diagnosis not present

## 2014-01-05 DIAGNOSIS — R5383 Other fatigue: Secondary | ICD-10-CM | POA: Diagnosis not present

## 2014-01-05 DIAGNOSIS — Z79899 Other long term (current) drug therapy: Secondary | ICD-10-CM | POA: Diagnosis not present

## 2014-01-05 DIAGNOSIS — R5381 Other malaise: Secondary | ICD-10-CM | POA: Diagnosis not present

## 2014-01-06 LAB — COMPREHENSIVE METABOLIC PANEL
ALT: 15 U/L (ref 0–53)
AST: 22 U/L (ref 0–37)
Albumin: 4 g/dL (ref 3.5–5.2)
Alkaline Phosphatase: 82 U/L (ref 39–117)
BUN: 18 mg/dL (ref 6–23)
CHLORIDE: 101 meq/L (ref 96–112)
CO2: 27 meq/L (ref 19–32)
CREATININE: 0.99 mg/dL (ref 0.50–1.35)
Calcium: 9 mg/dL (ref 8.4–10.5)
Glucose, Bld: 172 mg/dL — ABNORMAL HIGH (ref 70–99)
Potassium: 4.3 mEq/L (ref 3.5–5.3)
Sodium: 138 mEq/L (ref 135–145)
TOTAL PROTEIN: 6.6 g/dL (ref 6.0–8.3)
Total Bilirubin: 1.1 mg/dL (ref 0.2–1.2)

## 2014-01-06 LAB — TSH: TSH: 1.87 u[IU]/mL (ref 0.350–4.500)

## 2014-01-06 LAB — APTT: aPTT: 33 seconds (ref 24–37)

## 2014-01-06 LAB — CBC
HCT: 43.4 % (ref 39.0–52.0)
Hemoglobin: 15.4 g/dL (ref 13.0–17.0)
MCH: 33.3 pg (ref 26.0–34.0)
MCHC: 35.5 g/dL (ref 30.0–36.0)
MCV: 93.9 fL (ref 78.0–100.0)
PLATELETS: 163 10*3/uL (ref 150–400)
RBC: 4.62 MIL/uL (ref 4.22–5.81)
RDW: 14.2 % (ref 11.5–15.5)
WBC: 5.9 10*3/uL (ref 4.0–10.5)

## 2014-01-06 LAB — PROTIME-INR
INR: 1.12 (ref <1.50)
PROTHROMBIN TIME: 14.3 s (ref 11.6–15.2)

## 2014-01-07 ENCOUNTER — Encounter (HOSPITAL_COMMUNITY): Payer: Self-pay | Admitting: Pharmacy Technician

## 2014-01-10 MED ORDER — SODIUM CHLORIDE 0.9 % IR SOLN
80.0000 mg | Status: DC
  Filled 2014-01-10: qty 2

## 2014-01-10 MED ORDER — CEFAZOLIN SODIUM-DEXTROSE 2-3 GM-% IV SOLR
2.0000 g | INTRAVENOUS | Status: DC
  Filled 2014-01-10: qty 50

## 2014-01-11 ENCOUNTER — Encounter (HOSPITAL_COMMUNITY): Payer: Self-pay | Admitting: General Practice

## 2014-01-11 ENCOUNTER — Observation Stay (HOSPITAL_COMMUNITY)
Admission: RE | Admit: 2014-01-11 | Discharge: 2014-01-12 | Disposition: A | Discharge status: Home / Self Care | Payer: Medicare Other | Source: Ambulatory Visit | Attending: Cardiovascular Disease | Admitting: Cardiovascular Disease

## 2014-01-11 ENCOUNTER — Encounter (HOSPITAL_COMMUNITY)
Admission: RE | Disposition: A | Discharge status: Home / Self Care | Payer: Self-pay | Source: Ambulatory Visit | Attending: Cardiovascular Disease

## 2014-01-11 DIAGNOSIS — I509 Heart failure, unspecified: Secondary | ICD-10-CM | POA: Insufficient documentation

## 2014-01-11 DIAGNOSIS — I472 Ventricular tachycardia, unspecified: Secondary | ICD-10-CM | POA: Diagnosis present

## 2014-01-11 DIAGNOSIS — I5022 Chronic systolic (congestive) heart failure: Secondary | ICD-10-CM | POA: Insufficient documentation

## 2014-01-11 DIAGNOSIS — Z95810 Presence of automatic (implantable) cardiac defibrillator: Secondary | ICD-10-CM | POA: Diagnosis present

## 2014-01-11 DIAGNOSIS — I714 Abdominal aortic aneurysm, without rupture, unspecified: Secondary | ICD-10-CM | POA: Insufficient documentation

## 2014-01-11 DIAGNOSIS — G4733 Obstructive sleep apnea (adult) (pediatric): Secondary | ICD-10-CM | POA: Diagnosis not present

## 2014-01-11 DIAGNOSIS — I2510 Atherosclerotic heart disease of native coronary artery without angina pectoris: Secondary | ICD-10-CM | POA: Diagnosis present

## 2014-01-11 DIAGNOSIS — I428 Other cardiomyopathies: Secondary | ICD-10-CM | POA: Diagnosis present

## 2014-01-11 DIAGNOSIS — I4729 Other ventricular tachycardia: Secondary | ICD-10-CM | POA: Diagnosis not present

## 2014-01-11 DIAGNOSIS — Z4502 Encounter for adjustment and management of automatic implantable cardiac defibrillator: Principal | ICD-10-CM | POA: Insufficient documentation

## 2014-01-11 HISTORY — PX: LEAD REVISION: SHX5945

## 2014-01-11 HISTORY — DX: Presence of automatic (implantable) cardiac defibrillator: Z95.810

## 2014-01-11 HISTORY — PX: IMPLANTABLE CARDIOVERTER DEFIBRILLATOR (ICD) GENERATOR CHANGE: SHX5469

## 2014-01-11 HISTORY — DX: Unspecified osteoarthritis, unspecified site: M19.90

## 2014-01-11 HISTORY — DX: Sleep apnea, unspecified: G47.30

## 2014-01-11 HISTORY — DX: Malignant neoplasm of prostate: C61

## 2014-01-11 HISTORY — DX: Unspecified asthma, uncomplicated: J45.909

## 2014-01-11 LAB — SURGICAL PCR SCREEN
MRSA, PCR: NEGATIVE
STAPHYLOCOCCUS AUREUS: NEGATIVE

## 2014-01-11 SURGERY — ICD GENERATOR CHANGE
Anesthesia: LOCAL

## 2014-01-11 MED ORDER — METOPROLOL TARTRATE 50 MG PO TABS
50.0000 mg | ORAL_TABLET | Freq: Two times a day (BID) | ORAL | Status: DC
  Administered 2014-01-11 – 2014-01-12 (×2): 50 mg via ORAL
  Filled 2014-01-11 (×3): qty 1

## 2014-01-11 MED ORDER — SODIUM CHLORIDE 0.9 % IJ SOLN: 3.0000 mL | INTRAMUSCULAR | Status: DC | PRN

## 2014-01-11 MED ORDER — LIDOCAINE HCL (PF) 1 % IJ SOLN
INTRAMUSCULAR | Status: AC
  Filled 2014-01-11: qty 60

## 2014-01-11 MED ORDER — SODIUM CHLORIDE 0.9 % IV SOLN
INTRAVENOUS | Status: DC
  Administered 2014-01-11: 12:00:00 via INTRAVENOUS

## 2014-01-11 MED ORDER — AMIODARONE HCL 100 MG PO TABS
100.0000 mg | ORAL_TABLET | Freq: Every day | ORAL | Status: DC
  Administered 2014-01-11 – 2014-01-12 (×2): 100 mg via ORAL
  Filled 2014-01-11 (×2): qty 1

## 2014-01-11 MED ORDER — CEFAZOLIN SODIUM 1-5 GM-% IV SOLN
INTRAVENOUS | Status: AC
  Filled 2014-01-11: qty 50

## 2014-01-11 MED ORDER — EZETIMIBE-SIMVASTATIN 10-20 MG PO TABS
1.0000 | ORAL_TABLET | Freq: Every day | ORAL | Status: DC
  Filled 2014-01-11: qty 1

## 2014-01-11 MED ORDER — CEFAZOLIN SODIUM 1-5 GM-% IV SOLN
1.0000 g | Freq: Four times a day (QID) | INTRAVENOUS | Status: AC
  Administered 2014-01-11 – 2014-01-12 (×3): 1 g via INTRAVENOUS
  Filled 2014-01-11 (×3): qty 50

## 2014-01-11 MED ORDER — MIDAZOLAM HCL 2 MG/2ML IJ SOLN
INTRAMUSCULAR | Status: AC
  Filled 2014-01-11: qty 2

## 2014-01-11 MED ORDER — MUPIROCIN 2 % EX OINT
TOPICAL_OINTMENT | CUTANEOUS | Status: AC
  Administered 2014-01-11: 1 via NASAL
  Filled 2014-01-11: qty 22

## 2014-01-11 MED ORDER — FENTANYL CITRATE 0.05 MG/ML IJ SOLN
INTRAMUSCULAR | Status: AC
  Filled 2014-01-11: qty 2

## 2014-01-11 MED ORDER — YOU HAVE A PACEMAKER BOOK
Freq: Once | Status: AC
  Administered 2014-01-12: 12:00:00
  Filled 2014-01-11 (×2): qty 1

## 2014-01-11 MED ORDER — ACETAMINOPHEN 10 MG/ML IV SOLN
1000.0000 mg | Freq: Once | INTRAVENOUS | Status: DC
Start: 2014-01-11 — End: 2014-01-11
  Filled 2014-01-11: qty 100

## 2014-01-11 MED ORDER — POLYETHYLENE GLYCOL 3350 17 G PO PACK
17.0000 g | PACK | Freq: Every evening | ORAL | Status: DC
  Administered 2014-01-11: 17 g via ORAL
  Filled 2014-01-11 (×2): qty 1

## 2014-01-11 MED ORDER — SIMVASTATIN 20 MG PO TABS
20.0000 mg | ORAL_TABLET | Freq: Every day | ORAL | Status: DC
  Filled 2014-01-11 (×2): qty 1

## 2014-01-11 MED ORDER — HEPARIN (PORCINE) IN NACL 2-0.9 UNIT/ML-% IJ SOLN
INTRAMUSCULAR | Status: AC
  Filled 2014-01-11: qty 500

## 2014-01-11 MED ORDER — KETOROLAC TROMETHAMINE 30 MG/ML IJ SOLN
30.0000 mg | Freq: Once | INTRAMUSCULAR | Status: DC
Start: 2014-01-11 — End: 2014-01-11
  Filled 2014-01-11: qty 1

## 2014-01-11 MED ORDER — ONDANSETRON HCL 4 MG/2ML IJ SOLN: 4.0000 mg | Freq: Four times a day (QID) | INTRAMUSCULAR | Status: DC | PRN

## 2014-01-11 MED ORDER — DIAZEPAM 5 MG PO TABS
5.0000 mg | ORAL_TABLET | ORAL | Status: AC
  Administered 2014-01-11: 5 mg via ORAL
  Filled 2014-01-11: qty 1

## 2014-01-11 MED ORDER — LORATADINE 10 MG PO TABS
10.0000 mg | ORAL_TABLET | Freq: Every day | ORAL | Status: DC
  Administered 2014-01-12: 10 mg via ORAL
  Filled 2014-01-11 (×2): qty 1

## 2014-01-11 MED ORDER — MUPIROCIN 2 % EX OINT
TOPICAL_OINTMENT | Freq: Two times a day (BID) | CUTANEOUS | Status: DC
  Administered 2014-01-11: 1 via NASAL

## 2014-01-11 MED ORDER — SODIUM CHLORIDE 0.9 % IV SOLN: INTRAVENOUS | Status: AC

## 2014-01-11 MED ORDER — ACETAMINOPHEN 500 MG PO TABS
500.0000 mg | ORAL_TABLET | Freq: Four times a day (QID) | ORAL | Status: DC | PRN
  Administered 2014-01-12 (×2): 500 mg via ORAL
  Filled 2014-01-11 (×2): qty 1

## 2014-01-11 MED ORDER — EZETIMIBE 10 MG PO TABS
10.0000 mg | ORAL_TABLET | Freq: Every day | ORAL | Status: DC
  Filled 2014-01-11 (×2): qty 1

## 2014-01-11 NOTE — Progress Notes (Signed)
ICD Criteria  Current LVEF:45% ;Obtained > 6 months ago.   NYHA Functional Classification: Class II  Heart Failure History:  Yes, Duration of heart failure since onset is > 9 months  Non-Ischemic Dilated Cardiomyopathy History:  Yes, timeframe is > 9 months  Atrial Fibrillation/Atrial Flutter:  No.  Ventricular Tachycardia History:  Yes, Hemodynamic status unknown. VT Type:  SVT - Monomorphic.  Cardiac Arrest History:  No  History of Syndromes with Risk of Sudden Death:  No.  Previous ICD:  Yes, ICD Type:  Single, Reason for ICD:  Secondary, reason for secondary prevention:  Ventricular Tachycardia  Electrophysiology Study: No.  Prior MI: No.  PPM: No.  OSA:  No  Patient Life Expectancy of >=1 year: Yes.  Anticoagulation Therapy:  Patient is NOT on anticoagulation therapy.   Beta Blocker Therapy:  Yes.   Ace Inhibitor/ARB Therapy:  No, Reason not on Ace Inhibitor/ARB therapy:  low BP

## 2014-01-11 NOTE — H&P (Signed)
Reason for proccedure ICD at [facility], recurrent sustained VT  [name] today for a followup ICD changeout and V-lead revision.   After we tried to wean him off amiodarone antiarrhythmic therapy, VT has returned, as has occurred at least on 2 occasions in the past, within a couple of weeks of discontinuing amiodarone he developed very frequent episodes of nonsustained ventricular tachycardia. A total of 66 episodes have been reported just in a couple of weeks . He has had sustained VT in the past on several occasions. He was unaware of the palpitations.   His defibrillator is a Medtronic Maximo with a sprint fidelis [phone] lead that is under advisory and has also had a gradually diminishing R wave sensing. His generator has just reached ERI.   Has only mildly depressed left ventricular systolic function. His nuclear stress test was interpreted as probably showing diaphragmatic attenuation, but he probably does have an inferior wall scar. Previous coronary angiography has shown scattered moderate coronary artery disease, but this was back in 2005. He does not have angina pectoris.  Repeat duplex ultrasonography of his abdominal aortic aneurysm shows no change. It remains small at 3.7 cm in diameter     Allergies  Allergen Reactions  . Crestor [Rosuvastatin]     Hurts muscles  . Lipitor [Atorvastatin]     Hurts stomach    Current Facility-Administered Medications  Medication Dose Route Frequency Provider Last Rate Last Dose  . 0.9 %  sodium chloride infusion   Intravenous Continuous Sanda Klein, MD 50 mL/hr at 01/11/14 1204    . acetaminophen (OFIRMEV) IV 1,000 mg  1,000 mg Intravenous Once Ailene Rud, MD      . ceFAZolin (ANCEF) IVPB 2 g/50 mL premix  2 g Intravenous On Call Daylon Lafavor, MD      . gentamicin (GARAMYCIN) 80 mg in sodium chloride irrigation 0.9 % 500 mL irrigation  80 mg Irrigation On Call Javanna Patin, MD      . ketorolac (TORADOL) 30 MG/ML  injection 30 mg  30 mg Intravenous Once Ailene Rud, MD      . mupirocin ointment (BACTROBAN) 2 %   Nasal BID Sanda Klein, MD   1 application at 31/51/76 1205  . sodium chloride 0.9 % injection 3 mL  3 mL Intravenous PRN Sanda Klein, MD        Past Medical History  Diagnosis Date  . ICD (implantable cardioverter-defibrillator) in place 08/23/2004    Medtronic  . Ventricular tachycardia   . Hemorrhage     Post-colonostomy  . Hernia   . CHF (congestive heart failure)   . Systolic dysfunction, left ventricle   . AAA (abdominal aortic aneurysm)     7/13 3.8cm  . Dyslipidemia     Past Surgical History  Procedure Laterality Date  . Colon surgery    . Hernia repair    . Nm myocar perf wall motion  01/29/2012    abnormal c/o infarct/scar,no ischemia present  . Cardiac catheterization  08/20/2004    noncritical CAD,mild global hypokinesis, EF 50%  . Prostate biopsy N/A 11/22/2013    Procedure: PROSTATE BIOPSY AND ULTRASOUND;  Surgeon: Ailene Rud, MD;  Location: [facility];  Service: Urology;  Laterality: N/A;    Family History  Problem Relation Age of Onset  . Heart failure Mother   . Heart failure Father     History   Social History  . Marital Status: Married    Spouse Name:  N/A    Number of Children: N/A  . Years of Education: N/A   Occupational History  . Not on file.   Social History Main Topics  . Smoking status: Never Smoker   . Smokeless tobacco: Never Used  . Alcohol Use: 0.0 oz/week    0 Cans of beer per week     Comment: "very Little"  . Drug Use: No  . Sexual Activity: Not on file   Other Topics Concern  . Not on file   Social History Narrative  . No narrative on file    Review of systems: The patient specifically denies any chest pain at rest or with exertion, dyspnea at rest or with exertion, orthopnea, paroxysmal nocturnal dyspnea, syncope, palpitations, focal neurological deficits, intermittent claudication, lower extremity  edema, unexplained weight gain, cough, hemoptysis or wheezing.  The patient also denies abdominal pain, nausea, vomiting, dysphagia, diarrhea, constipation, polyuria, polydipsia, dysuria, hematuria, frequency, urgency, abnormal bleeding or bruising, fever, chills, unexpected weight changes, mood swings, change in skin or hair texture, change in voice quality, auditory or visual problems, allergic reactions or rashes, new musculoskeletal complaints other than usual "aches and pains".   PHYSICAL EXAM BP 117/73  Pulse 63  Temp(Src) 97.3 F (36.3 C) (Oral)  Resp 18  Ht 5' 10.5" (1.791 m)  Wt 97.523 kg (215 lb)  BMI 30.40 kg/m2  SpO2 100% PHYSICAL EXAM  BP 116/70  Pulse 74  Resp 20  Ht 5' 8.5" (1.74 m)  Wt 100.154 kg (220 lb 12.8 oz)  BMI 33.08 kg/m2  General: Alert, oriented x3, no distress, obese  Head: no evidence of trauma, PERRL, EOMI, no exophtalmos or lid lag, no myxedema, no xanthelasma; normal ears, nose and oropharynx  Neck: normal jugular venous pulsations and no hepatojugular reflux; brisk carotid pulses without delay and no carotid bruits  Chest: clear to auscultation, no signs of consolidation by percussion or palpation, normal fremitus, symmetrical and full respiratory excursions, of the left subclavian ICD site  Cardiovascular: normal position and quality of the apical impulse, regular rhythm at baseline with very frequent ectopic beats in a pattern of trigeminy, normal first and second heart sounds, no murmurs, rubs or gallops  Abdomen: Protuberant, no tenderness or distention, no masses by palpation, no abnormal pulsatility or arterial bruits, normal bowel sounds, no hepatosplenomegaly  Extremities: no clubbing, cyanosis or edema; 2+ radial, ulnar and brachial pulses bilaterally; 2+ right femoral, posterior tibial and dorsalis pedis pulses; 2+ left femoral, posterior tibial and dorsalis pedis pulses; no subclavian or femoral bruits  Neurological: grossly nonfocal  EKG:  Sinus rhythm, first-degree AV block, left atrial abnormality, left axis deviation, right bundle branch block and left anterior fascicular block, no acute repolarization abnormalities   BMET    Component Value Date/Time   NA 138 01/05/2014 1358   K 4.3 01/05/2014 1358   CL 101 01/05/2014 1358   CO2 27 01/05/2014 1358   GLUCOSE 172* 01/05/2014 1358   BUN 18 01/05/2014 1358   CREATININE 0.99 01/05/2014 1358   CREATININE 1.11 11/17/2013 1400   CALCIUM 9.0 01/05/2014 1358   GFRNONAA 59* 11/17/2013 1400   GFRAA 68* 11/17/2013 1400     ASSESSMENT AND PLAN Will proceed with defibrillator generator change out but also placement of a new right ventricular defibrillator lead since his old lead is under advisory for possible lead fracture and has poor R wave sensing as well. Since Mr. Kelsay is now 80 years old I would also consider placement of a new  right atrial lead in case he develops need for atrial pacing or advanced heart block (he has bifascicular block). He does not have signs or symptoms of congestive heart failure and despite his broad QRS complex does not meet indications for CRT.  I've also recommended that he resume the amiodarone therapy since he clearly has a substantial increase in ventricular arrhythmia not long after we discontinued it.  This procedure has been fully reviewed with the patient and written informed consent has been obtained.   Orders Placed This Encounter  Procedures  . Surgical pcr screen  . Diet NPO time specified Except for: Sips with Meds  . Obtain Consent  . Electrode Placement  . For ICD and ICD generator change patients, confirm EKG has been done in the past 30 days.  If not place order and obtain test.  . Use clippers to remove hair, entire chest area  . Verify informed consent  . Void on call to EP Lab  . Lab instructions  . EKG 12-Lead   Meds ordered this encounter  Medications  . amiodarone (PACERONE) 200 MG tablet    Sig: Take 100 mg by mouth daily.    Marland Kitchen loratadine (CLARITIN) 10 MG tablet    Sig: Take 10 mg by mouth daily.  . polyethylene glycol (MIRALAX / GLYCOLAX) packet    Sig: Take 17 g by mouth every evening.  Marland Kitchen gentamicin (GARAMYCIN) 80 mg in sodium chloride irrigation 0.9 % 500 mL irrigation    Sig:   . ceFAZolin (ANCEF) IVPB 2 g/50 mL premix    Sig:     Order Specific Question:  Indication:    Answer:  Surgical Prophylaxis  . mupirocin ointment (BACTROBAN) 2 %    Sig:   . diazepam (VALIUM) tablet 5 mg    Sig:   . acetaminophen (OFIRMEV) IV 1,000 mg    Sig:     Order Specific Question:  Is the patient UNABLE to take oral / enteral medications?    Answer:  Yes    Order Specific Question:  Does the patient have a contraindication to NSAID use? (If YES, specify on line 3)    Answer:  Yes    Order Specific Question:  Answers to IV acetaminophen criteria above:    Answer:  "Yes" to all criteria.  Marland Kitchen ketorolac (TORADOL) 30 MG/ML injection 30 mg    Sig:   . 0.9 %  sodium chloride infusion    Sig:   . sodium chloride 0.9 % injection 3 mL    Sig:   . mupirocin ointment (BACTROBAN) 2 %    Sig:     Lamb, Crystal   : cabinet override    Aerielle Stoklosa  Sanda Klein, MD, [facility] [phone] office [phone] pager

## 2014-01-11 NOTE — Op Note (Addendum)
Procedure report  Procedure performed:  1. Single chamber ICD insertion 2. Removal of ICD generator at ERI 3. Capping of chronic Medtronic Sprint Fidelis [id number] ICD lead (under advisory)  4. Moderate sedation  5. Initial ICD lead and generator testing 6. Left upper extremity venography Reason for procedure:  1. Device generator at elective replacement interval  2. ICD lead under advisory Procedure performed by:  Sanda Klein, MD  Complications:  None  Estimated blood loss:  <5 mL  Medications administered during procedure:  Ancef 3 g intravenously,  lidocaine 1% 30 mL locally, fentanyl 100 mcg intravenously, Versed 5 mg intravenously Omnipaque 10 mL IV  Device details:   NEW Generator Medtronic Evera XT VR model number [id number], serial number [id number] NEW Right ventricular lead  Medtronic [id number], serial number  [id number]   EXPLANTED generator Medtronic Maximom VR,  model number [id number], serial number  [id number] (implanted 08/23/2004) CAPPED ICD lead Medtronic Sprint Fidelis [id number] dual coil serial number [id number]  Procedure details:  After the risks and benefits of the procedure were discussed the patient provided informed consent. She was brought to the cardiac catheter lab in the fasting state. The patient was prepped and draped in usual sterile fashion. Venography confirmed normal patency of the subclavian vein.Local anesthesia with 1% lidocaine was administered to to the left infraclavicular area. A 5-6cm horizontal incision was made parallel with and 2-3 cm caudal to the left clavicle, in the area of an old scar. An older scar was seen closer to the left clavicle. Using minimal electrocautery and mostly sharp and blunt dissection the prepectoral pocket was opened carefully to avoid injury to the loops of chronic lead. Extensive dissection was necessary. The device was explanted. The pocket was carefully inspected for hemostasis and flushed with copious amounts of antibiotic  solution.  An antibiotic-soaked sponge was placed in the pocket.  The lead was disconnected from the old generator and capped.  Under fluoroscopic guidance and using the modified Seldinger technique a single venipuncture was performed to access the left subclavian vein. No difficulty was encountered accessing the vein.  A J-tip guidewire was  subsequently exchanged for a 61F [place] safe sheath.  Under fluoroscopic guidance the ventricular lead was advanced to level of the mid to apical right ventricular septum and the active-fixation helix was deployed. Extensive mapping had to be performed to find a location with acceptable sensing. Prominent current of injury was seen. Satisfactory pacing and sensing parameters were recorded. There was no evidence of diaphragmatic stimulation at maximum device output. The safe sheath was peeled away and the lead was secured in place with 2-0 silk. The lead was connected to the device.  The sponge was removed from the pocket and the pocket was flushed with antibiotic solution.The entire system was then carefully inserted in the pocket with care been taking that the capped lead, the new lead and device assumed a comfortable position without pressure on the incision. Great care was taken that the leads be located deep to the generator. The pocket was then closed in layers using 2 layers of 2-0 Vicryl and cutaneous staples after which a sterile dressing was applied.   Defibrillation threshold testing was then performed. After adequate sedation was achieved, ventricular fibrillation was induced with a 1J shock on T method. There was appropriate sensing by the device. There were 2 dropouts during "least sensitive" settings. The arrhythmia was terminated by a single 15J shock. High voltage impedance during the shock was 76 ohm. Total event time  was 9 seconds. Retesting of the lead and generator via telemetry confirmed normal function.  At the end of the procedure the following  lead parameters were encountered:   Right ventricular lead sensed R waves  7 mV, impedance 760 ohms, threshold 0.5 at 0.4 ms pulse width.  Sanda Klein, MD, [facility] [phone] office [phone] pager

## 2014-01-12 ENCOUNTER — Inpatient Hospital Stay (HOSPITAL_COMMUNITY): Payer: Medicare Other

## 2014-01-12 ENCOUNTER — Encounter (HOSPITAL_COMMUNITY): Payer: Self-pay | Admitting: Cardiology

## 2014-01-12 DIAGNOSIS — I472 Ventricular tachycardia: Secondary | ICD-10-CM | POA: Diagnosis not present

## 2014-01-12 DIAGNOSIS — I2510 Atherosclerotic heart disease of native coronary artery without angina pectoris: Secondary | ICD-10-CM | POA: Diagnosis not present

## 2014-01-12 DIAGNOSIS — Z95810 Presence of automatic (implantable) cardiac defibrillator: Secondary | ICD-10-CM | POA: Diagnosis not present

## 2014-01-12 DIAGNOSIS — G4733 Obstructive sleep apnea (adult) (pediatric): Secondary | ICD-10-CM | POA: Diagnosis not present

## 2014-01-12 DIAGNOSIS — I428 Other cardiomyopathies: Secondary | ICD-10-CM | POA: Diagnosis not present

## 2014-01-12 DIAGNOSIS — I429 Cardiomyopathy, unspecified: Secondary | ICD-10-CM | POA: Insufficient documentation

## 2014-01-12 DIAGNOSIS — Z4502 Encounter for adjustment and management of automatic implantable cardiac defibrillator: Secondary | ICD-10-CM

## 2014-01-12 DIAGNOSIS — I4729 Other ventricular tachycardia: Secondary | ICD-10-CM | POA: Diagnosis not present

## 2014-01-12 HISTORY — DX: Presence of automatic (implantable) cardiac defibrillator: Z95.810

## 2014-01-12 NOTE — Discharge Summary (Signed)
Physician Discharge Summary       Patient ID: William [name] MRN: [id number] DOB/AGE: 78-Dec-1929 78 y.o.  Admit date: 01/11/2014 Discharge date: 01/12/2014  Discharge Diagnoses:  Principal Problem:   ICD, sprint fidelis lead under advisory and ERI of ICD generator. Active Problems:   S/P ICD (internal cardiac defibrillator) procedure, 01/11/14 removal of ERI gen and placement of [name] Evera XT VR & NEW Right ventricular lead [name]   Nonischemic cardiomyopathy   Sustained VT (ventricular tachycardia)   OSA (obstructive sleep apnea) - presumptive diagnosis   Coronary atherosclerosis   Discharged Condition: good  Primary Cardiologist: William [name]  Procedures: 01/12/2014   Procedure performed:  1. Single chamber ICD insertion  2. Removal of ICD generator at ERI  3. Capping of chronic [name] Sprint Fidelis [id number] ICD lead (under advisory)  4. Moderate sedation  5. Initial ICD lead and generator testing  6. Left upper extremity venography  NEW Generator [name] Evera XT VR model number [id number], serial number [id number]  NEW Right ventricular lead [name] [id number], serial number [id number]  Done by William [name] Course: 80 year old Male was seen in the office 11/19/13 for a followup ICD changeout and V-lead revision.  After we tried to wean him off amiodarone antiarrhythmic therapy, VT has returned, as has occurred at least on 2 occasions in the past, within a couple of weeks of discontinuing amiodarone he developed very frequent episodes of nonsustained ventricular tachycardia. A total of 66 episodes have been reported just in a couple of weeks . He has had sustained VT in the past on several occasions. He was unaware of the palpitations.   His defibrillator is a [name] Maximo with a sprint fidelis [phone] lead that is under advisory and has also had a gradually diminishing R wave sensing. His generator has just reached ERI.   Has only mildly depressed  left ventricular systolic function. His nuclear stress test was interpreted as probably showing diaphragmatic attenuation, but he probably does have an inferior wall scar. Previous coronary angiography has shown scattered moderate coronary artery disease, but this was back in 2005. He does not have angina pectoris.  Repeat duplex ultrasonography of his abdominal aortic aneurysm shows no change. It remains small at 3.7 cm in diameter.  Defib gen change out was recommended  but also placement of a new right ventricular defibrillator lead since his old lead is under advisory for possible lead fracture and has poor R wave sensing as well. Since William [name] is now 80 years old I would also consider placement of a new right atrial lead in case he develops need for atrial pacing or advanced heart block (he has bifascicular block). He does not have signs or symptoms of congestive heart failure and despite his broad QRS complex does not meet indications for CRT.  Pt presented 01/11/14 for procedure and underwent without complications.  By the next AM he was stable along with CXR and interrogation of device. After ambulation he will be discharged with follow up as instructed.    Consults: None  Significant Diagnostic Studies:  2 V CXR; COMPARISON: 11/17/2013 FINDINGS: A new ICD has been implanted. No pneumothorax. Heart size and vascularity are normal. Stable scarring at the lung bases. The lungs are otherwise clear. No acute osseous abnormality. IMPRESSION: No acute abnormalities. New ICD in place   Discharge Exam: Blood pressure 107/55, pulse 76, temperature 98 F (36.7 C), temperature source Oral, resp. rate 18, height 5' 10.5" (1.791  m), weight 216 lb 0.8 oz (98 kg), SpO2 96.00%.    Disposition: 01-Home or Self Care       Future Appointments Provider Department Dept Phone   01/20/2014 11:00 AM William Kicks, NP [facility] [phone]       Medication List          acetaminophen 500 MG tablet  Commonly known as:  TYLENOL  Take 500 mg by mouth every 6 (six) hours as needed (for pain).     amiodarone 200 MG tablet  Commonly known as:  PACERONE  Take 100 mg by mouth daily.     ezetimibe-simvastatin 10-20 MG per tablet  Commonly known as:  VYTORIN  Take 1 tablet by mouth at bedtime.     loratadine 10 MG tablet  Commonly known as:  CLARITIN  Take 10 mg by mouth daily.     metoprolol tartrate 25 MG tablet  Commonly known as:  LOPRESSOR  Take 50 mg by mouth 2 (two) times daily.     polyethylene glycol packet  Commonly known as:  MIRALAX / GLYCOLAX  Take 17 g by mouth every evening.       Follow-up Information   Follow up with [facility] R, NP On 01/20/2014. (at 11:00 AM for staple removal)    Specialty:  Cardiology   Contact information:   [address] [place] [address] [phone]        Discharge Instructions: Heart Healthy diet Post ICD placement orders.  Signed: Isaiah [name] Nurse Practitioner-Certified [facility]: [facility] 01/12/2014, 11:01 AM  Time spent on discharge :30 minutes.    Patient seen and examined.  Plan as discussed in my rounding note for today and outlined above. William [name]  01/12/2014  11:10 AM

## 2014-01-12 NOTE — Discharge Instructions (Signed)
Supplemental Discharge Instructions for  Pacemaker/Defibrillator Patients  Activity Do not raise your left arm above shoulder level or extend it backward beyond shoulder level for 2 weeks. Wear the arm sling as a reminder or as needed for comfort for 2 weeks. No heavy lifting or vigorous activity with your left/right arm for 6-8 weeks.    NO DRIVING is preferable for 2 weeks; If absolutely necessary, drive only short, familiar routes. DO wear your seatbelt, even if it crosses over the pacemaker site.  WOUND CARE   Keep the wound area clean and dry.  Remove the dressing the day after you return home (usually 48 hours after the procedure).   DO NOT SUBMERGE UNDER WATER UNTIL FULLY HEALED (no tub baths, hot tubs, swimming pools, etc.).    You  may shower or take a sponge bath after the dressing is removed. DO NOT SOAK the area and do not allow the shower to directly spray on the site.   If you have staples, these will be removed in the office in 7-14 days.   If you have tape/steri-strips on your wound, these will fall off; do not pull them off prematurely.     No bandage is needed on the site.  DO  NOT apply any creams, oils, or ointments to the wound area.   If you notice any drainage or discharge from the wound, any swelling, excessive redness or bruising at the site, or if you develop a fever > 101? F after you are discharged home, call the office at once.  Special Instructions   You are still able to use cellular telephones.  Avoid carrying your cellular phone near your device.   When traveling through airports, show security personnel your identification card to avoid being screened in the metal detectors.    Avoid arc welding equipment, MRI testing (magnetic resonance imaging), TENS units (transcutaneous nerve stimulators).  Call the office for questions about other devices.   Avoid electrical appliances that are in poor condition or are not properly grounded.   Microwave ovens are safe to  be near or to operate.  Additional information for defibrillator patients should your device go off:   If your device goes off ONCE and you feel fine afterward, notify the clinic at [phone].   If your device goes off ONCE and you do not feel well afterward, call 911.   If your device goes off TWICE or more in one day, call 911.  DO NOT DRIVE YOURSELF OR A FAMILY MEMBER WITH A DEFIBRILLATOR TO THE HOSPITAL--CALL 911.  Continue Heart Healthy Diet.

## 2014-01-12 NOTE — Progress Notes (Signed)
   SUBJECTIVE:  No complaints this AM.     PHYSICAL EXAM Filed Vitals:   01/11/14 2122 01/11/14 2326 01/12/14 0633 01/12/14 0805  BP: 105/68 114/74 105/71 107/55  Pulse: 69 67 78 76  Temp:  98.3 F (36.8 C) 97.8 F (36.6 C) 98 F (36.7 C)  TempSrc:  Oral Oral Oral  Resp:  18 20 18   Height:      Weight:   216 lb 0.8 oz (98 kg)   SpO2:  97% 96% 96%   General:  No distress Lungs:  Clear Heart:  RRR Abdomen:  Positive bowel sounds, no rebound no guarding Extremities:  No edema Chest:  Left upper chest wound no oozing.  No swelling.   LABS: No results found for this basename: TROPONINI   Results for orders placed during the hospital encounter of 01/11/14 (from the past 24 hour(s))  SURGICAL PCR SCREEN     Status: None   Collection Time    01/11/14 11:38 AM      Result Value Ref Range   MRSA, PCR NEGATIVE  NEGATIVE   Staphylococcus aureus NEGATIVE  NEGATIVE    Intake/Output Summary (Last 24 hours) at 01/12/14 0941 Last data filed at 01/12/14 0926  Gross per 24 hour  Intake 610.83 ml  Output   2000 ml  Net -1389.17 ml    CXR:  A new ICD has been implanted. No pneumothorax. Heart size and  vascularity are normal. Stable scarring at the lung bases. The lungs  are otherwise clear. No acute osseous abnormality.   ASSESSMENT AND PLAN:  Active Problems:   ICD, sprint fidelis lead under advisory  Status post ICD.  CXR and follow up interrogation OK.  OK to discharge with follow up per protocol.  Continue previous medications.    Jeneen Rinks [facility] 01/12/2014 9:41 AM

## 2014-01-17 DIAGNOSIS — C61 Malignant neoplasm of prostate: Secondary | ICD-10-CM | POA: Diagnosis not present

## 2014-01-18 DIAGNOSIS — C61 Malignant neoplasm of prostate: Secondary | ICD-10-CM | POA: Diagnosis not present

## 2014-01-20 ENCOUNTER — Ambulatory Visit (INDEPENDENT_AMBULATORY_CARE_PROVIDER_SITE_OTHER): Payer: Medicare Other | Admitting: Cardiology

## 2014-01-20 ENCOUNTER — Encounter: Payer: Self-pay | Admitting: Cardiology

## 2014-01-20 VITALS — BP 112/72 | HR 66 | Ht 70.0 in | Wt 229.0 lb

## 2014-01-20 DIAGNOSIS — Z95810 Presence of automatic (implantable) cardiac defibrillator: Secondary | ICD-10-CM

## 2014-01-20 DIAGNOSIS — C61 Malignant neoplasm of prostate: Secondary | ICD-10-CM | POA: Insufficient documentation

## 2014-01-20 DIAGNOSIS — I428 Other cardiomyopathies: Secondary | ICD-10-CM

## 2014-01-20 NOTE — Assessment & Plan Note (Signed)
Lungs clear on exam

## 2014-01-20 NOTE — Patient Instructions (Signed)
If steri strips on pacemaker site in 4-5 days, they haven't fallen off, then ok to remove.  Follow up with Dr. Sallyanne Kuster on by 02/21/14 or 02/22/14 to check ICD and surgical clearance.

## 2014-01-20 NOTE — Assessment & Plan Note (Signed)
Needs procedure by Dr. Hartley Barefoot and would like to discuss with Dr. Sallyanne Kuster before procedure.  We will arrange.

## 2014-01-20 NOTE — Assessment & Plan Note (Signed)
ICD site stable, no erythmia , staples removed.

## 2014-01-20 NOTE — Progress Notes (Signed)
01/20/2014   PCP: Haywood Pao, MD   Chief Complaint  Patient presents with  . Follow-up    pacer site check    Primary Cardiologist:  Dr. Sallyanne Kuster  HPI:  Pt here for site check after ICD changeout and V lead revision.   His defibrillator is a Medtronic Maximo with a sprint fidelis [id number] lead that is under advisory and has also had a gradually diminishing R wave sensing. His generator had just reached ERI.    01/11/14 Pt had: NEW Generator Medtronic Fortuna XT VR model number [id number], serial number [id number]  NEW Right ventricular lead Medtronic [id number], serial number [id number]  EXPLANTED generator Medtronic Maximom VR, model number [id number], serial number [id number] (implanted 08/23/2004)  CAPPED ICD lead Medtronic Sprint Fidelis [id number] dual coil serial number [id number]  He is doing well without complaints.  He needs surgery for prostate cancer with GU May 14th, he will see Dr. Sallyanne Kuster back prior to that time.  Allergies  Allergen Reactions  . Crestor [Rosuvastatin]     Hurts muscles  . Lipitor [Atorvastatin]     Hurts stomach    Current Outpatient Prescriptions  Medication Sig Dispense Refill  . acetaminophen (TYLENOL) 500 MG tablet Take 500 mg by mouth every 6 (six) hours as needed (for pain).       Marland Kitchen amiodarone (PACERONE) 200 MG tablet Take 100 mg by mouth daily.      Marland Kitchen ezetimibe-simvastatin (VYTORIN) 10-20 MG per tablet Take 1 tablet by mouth at bedtime.      Marland Kitchen loratadine (CLARITIN) 10 MG tablet Take 10 mg by mouth daily.      . metoprolol tartrate (LOPRESSOR) 25 MG tablet Take 50 mg by mouth 2 (two) times daily.      . polyethylene glycol (MIRALAX / GLYCOLAX) packet Take 17 g by mouth every evening.       No current facility-administered medications for this visit.    Past Medical History  Diagnosis Date  . Ventricular tachycardia   . Hemorrhage     Post-colonostomy  . Hernia   . CHF (congestive heart failure)   . Systolic dysfunction, left  ventricle   . AAA (abdominal aortic aneurysm)     7/13 3.8cm  . Dyslipidemia   . Automatic implantable cardioverter-defibrillator in situ   . High cholesterol   . Asthma     "seasonal; some foods"   . Sleep apnea     "lost 60# & don't have it anymore" (01/11/2014)  . Arthritis     "joints" (01/11/2014)  . Prostate cancer   . S/P ICD (internal cardiac defibrillator) procedure, 01/11/14 removal of ERI gen and placement of Medtronic Evera XT VR & NEW Right ventricular lead Medtronic 01/12/2014    Past Surgical History  Procedure Laterality Date  . Nm myocar perf wall motion  01/29/2012    abnormal c/o infarct/scar,no ischemia present  . Cardiac catheterization  08/20/2004    noncritical CAD,mild global hypokinesis, EF 50%  . Prostate biopsy N/A 11/22/2013    Procedure: PROSTATE BIOPSY AND ULTRASOUND;  Surgeon: Ailene Rud, MD;  Location: [facility];  Service: Urology;  Laterality: N/A;  . Icd generator change  01/11/2014    "upgrade"  . Colectomy  1990's  . Hernia repair      "abdomen; from colon OR"  . Cataract extraction w/ intraocular lens implant Right   . Cardiac defibrillator placement  08/23/2004    Medtronic    WNU:[id number]  colds or fevers, no weight changes Skin:no rashes or ulcers CV:see HPI PUL:see HPI   PHYSICAL EXAM BP 112/72  Pulse 66  Ht 5\' 10"  (1.778 m)  Wt 229 lb (103.874 kg)  BMI 32.86 kg/m2 General:Pleasant affect, NAD Skin:Warm and dry, brisk capillary refill Heart:S1S2 RRR without murmur, gallup, rub or click Lungs:clear without rales, rhonchi, or wheezes Neuro:alert and oriented, MAE, follows commands, + facial symmetry   ASSESSMENT AND PLAN S/P ICD (internal cardiac defibrillator) procedure, 01/11/14 removal of ERI gen and placement of Medtronic Evera XT VR & NEW Right ventricular lead Medtronic ICD site stable, no erythmia , staples removed.  Nonischemic cardiomyopathy Lungs clear on exam  Prostate cancer Needs procedure by Dr. Hartley Barefoot  and would like to discuss with Dr. Sallyanne Kuster before procedure.  We will arrange.

## 2014-02-10 ENCOUNTER — Encounter (HOSPITAL_COMMUNITY): Payer: Self-pay | Admitting: Pharmacy Technician

## 2014-02-11 ENCOUNTER — Telehealth: Payer: Self-pay | Admitting: *Deleted

## 2014-02-11 NOTE — Telephone Encounter (Signed)
Signed perioperative Rx for implanted cardiac device programming faxed to [facility].

## 2014-02-14 NOTE — Patient Instructions (Signed)
Marquelle Musgrave Fabrizio  [birth year]   Your procedure is scheduled on:  02/24/13   0900am-1100am  Report to [facility] at     0700  AM.  Call this number if you have problems the morning of surgery: [phone]   Remember:   Do not eat food or drink liquids after midnight.   Take these medicines the morning of surgery with A SIP OF WATER:    Do not wear jewelry,   Do not wear lotions, powders, or perfumes.   . Men may shave face and neck.  Do not bring valuables to the hospital.  Contacts, dentures or bridgework may not be worn into surgery.       Patients discharged the day of surgery will not be allowed to drive  home.  Name and phone number of your driver:     [facility] - Preparing for Surgery Before surgery, you can play an important role.  Because skin is not sterile, your skin needs to be as free of germs as possible.  You can reduce the number of germs on your skin by washing with CHG (chlorahexidine gluconate) soap before surgery.  CHG is an antiseptic cleaner which kills germs and bonds with the skin to continue killing germs even after washing. Please DO NOT use if you have an allergy to CHG or antibacterial soaps.  If your skin becomes reddened/irritated stop using the CHG and inform your nurse when you arrive at Short Stay. Do not shave (including legs and underarms) for at least 48 hours prior to the first CHG shower.  You may shave your face. Please follow these instructions carefully:  1.  Shower with CHG Soap the night before surgery and the  morning of Surgery.  2.  If you choose to wash your hair, wash your hair first as usual with your  normal  shampoo.  3.  After you shampoo, rinse your hair and body thoroughly to remove the  shampoo.                           4.  Use CHG as you would any other liquid soap.  You can apply chg directly  to the skin and wash                       Gently with a scrungie or clean washcloth.  5.  Apply the CHG Soap to your  body ONLY FROM THE NECK DOWN.   Do not use on open                           Wound or open sores. Avoid contact with eyes, ears mouth and genitals (private parts).                        Genitals (private parts) with your normal soap.             6.  Wash thoroughly, paying special attention to the area where your surgery  will be performed.  7.  Thoroughly rinse your body with warm water from the neck down.  8.  DO NOT shower/wash with your normal soap after using and rinsing off  the CHG Soap.                9.  Pat yourself dry with a clean towel.  10.  Wear clean pajamas.            11.  Place clean sheets on your bed the night of your first shower and do not  sleep with pets. Day of Surgery : Do not apply any lotions/deodorants the morning of surgery.  Please wear clean clothes to the hospital/surgery center.  FAILURE TO FOLLOW THESE INSTRUCTIONS MAY RESULT IN THE CANCELLATION OF YOUR SURGERY PATIENT SIGNATURE_________________________________  NURSE SIGNATURE__________________________________  ________________________________________________________________________

## 2014-02-15 ENCOUNTER — Encounter (HOSPITAL_COMMUNITY): Payer: Self-pay

## 2014-02-15 ENCOUNTER — Encounter (HOSPITAL_COMMUNITY)
Admission: RE | Admit: 2014-02-15 | Discharge: 2014-02-15 | Disposition: A | Discharge status: Home / Self Care | Payer: Medicare Other | Source: Ambulatory Visit | Attending: Urology | Admitting: Urology

## 2014-02-15 DIAGNOSIS — Z01812 Encounter for preprocedural laboratory examination: Secondary | ICD-10-CM | POA: Insufficient documentation

## 2014-02-15 HISTORY — DX: Unspecified fracture of unspecified lumbar vertebra, initial encounter for closed fracture: S32.009A

## 2014-02-15 LAB — CBC
HEMATOCRIT: 41.7 % (ref 39.0–52.0)
HEMOGLOBIN: 15 g/dL (ref 13.0–17.0)
MCH: 33.4 pg (ref 26.0–34.0)
MCHC: 36 g/dL (ref 30.0–36.0)
MCV: 92.9 fL (ref 78.0–100.0)
Platelets: 120 10*3/uL — ABNORMAL LOW (ref 150–400)
RBC: 4.49 MIL/uL (ref 4.22–5.81)
RDW: 14 % (ref 11.5–15.5)
WBC: 5.9 10*3/uL (ref 4.0–10.5)

## 2014-02-15 LAB — BASIC METABOLIC PANEL
BUN: 19 mg/dL (ref 6–23)
CHLORIDE: 103 meq/L (ref 96–112)
CO2: 26 mEq/L (ref 19–32)
Calcium: 9.1 mg/dL (ref 8.4–10.5)
Creatinine, Ser: 1.24 mg/dL (ref 0.50–1.35)
GFR calc non Af Amer: 52 mL/min — ABNORMAL LOW (ref >90)
GFR, EST AFRICAN AMERICAN: 60 mL/min — AB (ref >90)
GLUCOSE: 108 mg/dL — AB (ref 70–99)
Potassium: 4.8 mEq/L (ref 3.7–5.3)
Sodium: 138 mEq/L (ref 137–147)

## 2014-02-15 NOTE — Progress Notes (Signed)
Last office visit with William [name] 01/2014 EPIC  01/11/14- ICE replaced - Medtronic Last interrogation in EPIC- 01/11/14 EPIC

## 2014-02-15 NOTE — Progress Notes (Signed)
CBC results faxed via EPIC to Dr Gaynelle Arabian.

## 2014-02-15 NOTE — Progress Notes (Signed)
DR Lissa Hoard in to see patient and wife for preop anesthesia consult.

## 2014-02-16 ENCOUNTER — Telehealth: Payer: Self-pay | Admitting: *Deleted

## 2014-02-16 NOTE — Telephone Encounter (Signed)
Signed perioperative Rx for implanted cardiac device programming faxed.

## 2014-02-21 ENCOUNTER — Encounter: Payer: Self-pay | Admitting: Cardiovascular Disease

## 2014-02-21 ENCOUNTER — Ambulatory Visit (INDEPENDENT_AMBULATORY_CARE_PROVIDER_SITE_OTHER): Payer: Medicare Other | Admitting: Cardiovascular Disease

## 2014-02-21 VITALS — BP 118/62 | HR 63 | Resp 16 | Ht 70.0 in | Wt 229.6 lb

## 2014-02-21 DIAGNOSIS — I2510 Atherosclerotic heart disease of native coronary artery without angina pectoris: Secondary | ICD-10-CM

## 2014-02-21 DIAGNOSIS — I714 Abdominal aortic aneurysm, without rupture, unspecified: Secondary | ICD-10-CM

## 2014-02-21 DIAGNOSIS — I429 Cardiomyopathy, unspecified: Secondary | ICD-10-CM

## 2014-02-21 DIAGNOSIS — I428 Other cardiomyopathies: Secondary | ICD-10-CM

## 2014-02-21 DIAGNOSIS — Z95810 Presence of automatic (implantable) cardiac defibrillator: Secondary | ICD-10-CM

## 2014-02-21 DIAGNOSIS — I472 Ventricular tachycardia, unspecified: Secondary | ICD-10-CM

## 2014-02-21 DIAGNOSIS — I4729 Other ventricular tachycardia: Secondary | ICD-10-CM | POA: Diagnosis not present

## 2014-02-21 LAB — MDC_IDC_ENUM_SESS_TYPE_INCLINIC
Battery Remaining Longevity: 137 mo
Battery Voltage: 3.15 V
Brady Statistic RV Percent Paced: 0.02 %
Date Time Interrogation Session: 20150511141054
HighPow Impedance: 190 Ohm
HighPow Impedance: 70 Ohm
Lead Channel Impedance Value: 456 Ohm
Lead Channel Pacing Threshold Amplitude: 0.875 V
Lead Channel Pacing Threshold Pulse Width: 0.4 ms
Lead Channel Sensing Intrinsic Amplitude: 4.375 mV
Lead Channel Sensing Intrinsic Amplitude: 4.875 mV
Lead Channel Setting Pacing Amplitude: 2.5 V
Lead Channel Setting Pacing Pulse Width: 0.4 ms
Lead Channel Setting Sensing Sensitivity: 0.3 mV
Zone Setting Detection Interval: 240 ms
Zone Setting Detection Interval: 300 ms
Zone Setting Detection Interval: 400 ms
Zone Setting Detection Interval: 450 ms

## 2014-02-21 LAB — PACEMAKER DEVICE OBSERVATION

## 2014-02-21 NOTE — Assessment & Plan Note (Signed)
Normal device function. Schedule for a remote downloaded in 3 months and an office evaluation in 6 months.

## 2014-02-21 NOTE — Assessment & Plan Note (Signed)
No episodes of VT have been detected since implantation of the new device. He will continue on low-dose amiodarone with periodic review of his liver function tests, thyroid function tests and lung function. Attempts at discontinuation of amiodarone in the past led to recurrent episodes of VT.

## 2014-02-21 NOTE — Assessment & Plan Note (Signed)
Scheduled for repeat evaluation with duplex ultrasound in September

## 2014-02-21 NOTE — Progress Notes (Signed)
Patient ID: William [name], male   DOB: [birth year], 80 y.o.   MRN: [id number]      Reason for office visit VT, nonischemic cardiomyopathy  William [name] now roughly 2 months status post replacement of his defibrillator generator and ventricular lead (his initial lead was under advisory). His current device is a Medtronic  Evera dual-chamber ICD. 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 The arrhythmia responds very well to amiodarone therapy even in very low doses. Attempts at completely weaning off this medication have repeatedly been associated with recurrent arrhythmia. Has a relatively small infrarenal abdominal aortic aneurysm, 3.7 cm in diameter by the most recent evaluation.  He has been diagnosed with prostate cancer, apparently a fairly aggressive histology, and is due to undergo cryotherapy later this week with Dr. Gaynelle Arabian.   Allergies  Allergen Reactions  . Crestor [Rosuvastatin]     Hurts muscles  . Lipitor [Atorvastatin]     Hurts stomach    Current Outpatient Prescriptions  Medication Sig Dispense Refill  . acetaminophen (TYLENOL) 500 MG tablet Take 500 mg by mouth every 6 (six) hours as needed (for pain).       Marland Kitchen amiodarone (PACERONE) 200 MG tablet Take 100 mg by mouth daily.      Marland Kitchen ezetimibe-simvastatin (VYTORIN) 10-20 MG per tablet Take 1 tablet by mouth at bedtime.      Marland Kitchen loratadine (CLARITIN) 10 MG tablet Take 10 mg by mouth daily as needed for allergies.       . metoprolol tartrate (LOPRESSOR) 25 MG tablet Take 50 mg by mouth 2 (two) times daily.      . naproxen (NAPROSYN) 500 MG tablet Take 500 mg by mouth 2 (two) times daily with a meal. Patient taking as needed for aches and pains related  To fall on 02/11/2014      . polyethylene glycol (MIRALAX / GLYCOLAX) packet Take 17 g by mouth every evening.       No current facility-administered  medications for this visit.    Past Medical History  Diagnosis Date  . Ventricular tachycardia   . Hemorrhage     Post-colonostomy  . Hernia   . CHF (congestive heart failure)   . Systolic dysfunction, left ventricle   . AAA (abdominal aortic aneurysm)     7/13 3.8cm  . Dyslipidemia   . Automatic implantable cardioverter-defibrillator in situ   . High cholesterol   . Asthma     "seasonal; some foods"   . Sleep apnea     "lost 60# & don't have it anymore" (01/11/2014)  . Arthritis     "joints" (01/11/2014)  . Prostate cancer   . S/P ICD (internal cardiac defibrillator) procedure, 01/11/14 removal of ERI gen and placement of Medtronic Evera XT VR & NEW Right ventricular lead Medtronic 01/12/2014  . Dysrhythmia   . Shortness of breath     with exerton   . Lumbar vertebral fracture     L1- 04/11/2014     Past Surgical History  Procedure Laterality Date  . Nm myocar perf wall motion  01/29/2012    abnormal c/o infarct/scar,no ischemia present  . Cardiac catheterization  08/20/2004    noncritical CAD,mild global hypokinesis, EF 50%  . Prostate biopsy N/A 11/22/2013    Procedure: PROSTATE BIOPSY AND ULTRASOUND;  Surgeon: Ailene Rud, MD;  Location: [facility];  Service: Urology;  Laterality: N/A;  . Icd generator change  01/11/2014    "upgrade"  . Colectomy  1990's  . Hernia repair      "abdomen; from colon OR"  . Cataract extraction w/ intraocular lens implant Right   . Cardiac defibrillator placement  08/23/2004    Medtronic    Family History  Problem Relation Age of Onset  . Heart failure Mother   . Heart failure Father     History   Social History  . Marital Status: Married    Spouse Name: N/A    Number of Children: N/A  . Years of Education: N/A   Occupational History  . Not on file.   Social History Main Topics  . Smoking status: Never Smoker   . Smokeless tobacco: Never Used  . Alcohol Use: 0.0 oz/week    0 Cans of beer per week     Comment: 01/11/2014  "drink 1/2 of a beer twice per month   . Drug Use: No  . Sexual Activity: No   Other Topics Concern  . Not on file   Social History Narrative  . No narrative on file    Review of systems: Fatigue, dizziness, weak legs, gait instability. Denies chest pain, shortness of breath, neurological complaints, palpitations or syncope, intermittent claudication, lower extremity edema, unexplained weight gain, cough, hemoptysis or wheezing.  The patient also denies abdominal pain, nausea, vomiting, dysphagia, diarrhea, constipation, polyuria, polydipsia, dysuria, hematuria, frequency, urgency, abnormal bleeding or bruising, fever, chills, unexpected weight changes, mood swings, change in skin or hair texture, change in voice quality, auditory or visual problems, allergic reactions or rashes, new musculoskeletal complaints other than usual "aches and pains".    PHYSICAL EXAM BP 118/62  Pulse 63  Resp 16  Ht 5\' 10"  (1.778 m)  Wt 229 lb 9.6 oz (104.146 kg)  BMI 32.94 kg/m2 General: Alert, oriented x3, no distress, obese  Head: no evidence of trauma, PERRL, EOMI, no exophtalmos or lid lag, no myxedema, no xanthelasma; normal ears, nose and oropharynx  Neck: normal jugular venous pulsations and no hepatojugular reflux; brisk carotid pulses without delay and no carotid bruits  Chest: clear to auscultation, no signs of consolidation by percussion or palpation, normal fremitus, symmetrical and full respiratory excursions, of the left subclavian ICD site  Cardiovascular: normal position and quality of the apical impulse, regular rhythm at baseline with very frequent ectopic beats in a pattern of trigeminy, normal first and second heart sounds, no murmurs, rubs or gallops  Abdomen: Protuberant, no tenderness or distention, no masses by palpation, no abnormal pulsatility or arterial bruits, normal bowel sounds, no hepatosplenomegaly  Extremities: no clubbing, cyanosis or edema; 2+ radial, ulnar and brachial  pulses bilaterally; 2+ right femoral, posterior tibial and dorsalis pedis pulses; 2+ left femoral, posterior tibial and dorsalis pedis pulses; no subclavian or femoral bruits  Neurological: grossly nonfocal   EKG: Sinus rhythm, first-degree AV block, left atrial abnormality, left axis deviation, right bundle branch block and left anterior fascicular block, no acute repolarization abnormalities   BMET    Component Value Date/Time   NA 138 02/15/2014 1135   K 4.8 02/15/2014 1135   CL 103 02/15/2014 1135   CO2 26 02/15/2014 1135   GLUCOSE 108* 02/15/2014 1135   BUN 19 02/15/2014 1135   CREATININE 1.24 02/15/2014 1135   CREATININE 0.99 01/05/2014 1358   CALCIUM 9.1 02/15/2014 1135   GFRNONAA 52* 02/15/2014 1135   GFRAA 60* 02/15/2014 1135     ASSESSMENT AND PLAN Sustained VT (ventricular tachycardia) No episodes of  VT have been detected since implantation of the new device. He will continue on low-dose amiodarone with periodic review of his liver function tests, thyroid function tests and lung function. Attempts at discontinuation of amiodarone in the past led to recurrent episodes of VT.  S/P ICD (internal cardiac defibrillator) procedure, 01/11/14 removal of ERI gen and placement of Medtronic Evera XT VR & NEW Right ventricular lead Medtronic Normal device function. Schedule for a remote downloaded in 3 months and an office evaluation in 6 months.  Nonischemic cardiomyopathy Left ventricular ejection fraction has been between 45 and 50% depending on this study is for evaluation. It has been stable at this level for the last 10 years. He has excellent functional status (NYHA class I) and is clinically euvolemic. His risk of major cardiovascular complications with the planned prostate procedure is low.  AAA (abdominal aortic aneurysm) Scheduled for repeat evaluation with duplex ultrasound in September    William [name]  Sanda Klein, MD, [facility] [phone] office [phone]  pager

## 2014-02-21 NOTE — Progress Notes (Signed)
Last office visit with Dr Loleta Chance on 02/21/2014 in [place].

## 2014-02-21 NOTE — Assessment & Plan Note (Signed)
Left ventricular ejection fraction has been between 45 and 50% depending on this study is for evaluation. It has been stable at this level for the last 10 years. He has excellent functional status (NYHA class I) and is clinically euvolemic. His risk of major cardiovascular complications with the planned prostate procedure is low.

## 2014-02-21 NOTE — Patient Instructions (Addendum)
Remote monitoring is used to monitor your ICD from home. This monitoring reduces the number of office visits required to check your device to one time per year. It allows [place] to keep an eye on the functioning of your device to ensure it is working properly. You are scheduled for a device check from home on 05-25-2014. You may send your transmission at any time that day. If you have a wireless device, the transmission will be sent automatically. After your physician reviews your transmission, you will receive a postcard with your next transmission date.  Your physician has requested that you have an abdominal aorta duplex in September 2015. During this test, an ultrasound is used to evaluate the aorta. Allow 30 minutes for this exam. Do not eat after midnight the day before and avoid carbonated beverages  Your physician recommends that you schedule a follow-up appointment in: 6 months with Dr.Croitoru

## 2014-02-21 NOTE — Progress Notes (Signed)
Last device check with Dr Loleta Chance on 02/21/14 in [facility]

## 2014-02-22 ENCOUNTER — Encounter: Payer: Self-pay | Admitting: Cardiovascular Disease

## 2014-02-24 ENCOUNTER — Encounter (HOSPITAL_COMMUNITY): Payer: Self-pay | Admitting: *Deleted

## 2014-02-24 ENCOUNTER — Ambulatory Visit (HOSPITAL_COMMUNITY)
Admission: RE | Admit: 2014-02-24 | Discharge: 2014-02-24 | Disposition: A | Discharge status: Home / Self Care | Payer: Medicare Other | Source: Ambulatory Visit | Attending: Urology | Admitting: Urology

## 2014-02-24 ENCOUNTER — Encounter (HOSPITAL_COMMUNITY)
Admission: RE | Disposition: A | Discharge status: Home / Self Care | Payer: Self-pay | Source: Ambulatory Visit | Attending: Urology

## 2014-02-24 ENCOUNTER — Ambulatory Visit (HOSPITAL_COMMUNITY): Payer: Medicare Other | Admitting: Anesthesiology

## 2014-02-24 ENCOUNTER — Encounter (HOSPITAL_COMMUNITY): Payer: Medicare Other | Admitting: Anesthesiology

## 2014-02-24 DIAGNOSIS — I509 Heart failure, unspecified: Secondary | ICD-10-CM | POA: Diagnosis not present

## 2014-02-24 DIAGNOSIS — K5900 Constipation, unspecified: Secondary | ICD-10-CM | POA: Diagnosis not present

## 2014-02-24 DIAGNOSIS — K648 Other hemorrhoids: Secondary | ICD-10-CM | POA: Diagnosis not present

## 2014-02-24 DIAGNOSIS — C61 Malignant neoplasm of prostate: Secondary | ICD-10-CM | POA: Diagnosis not present

## 2014-02-24 DIAGNOSIS — R0602 Shortness of breath: Secondary | ICD-10-CM | POA: Diagnosis not present

## 2014-02-24 DIAGNOSIS — G4730 Sleep apnea, unspecified: Secondary | ICD-10-CM | POA: Diagnosis not present

## 2014-02-24 HISTORY — PX: CRYOABLATION: SHX1415

## 2014-02-24 SURGERY — CRYOABLATION, PROSTATE
Anesthesia: General | Site: Scrotum

## 2014-02-24 MED ORDER — MIDAZOLAM HCL 2 MG/2ML IJ SOLN
INTRAMUSCULAR | Status: AC
  Filled 2014-02-24: qty 2

## 2014-02-24 MED ORDER — PROMETHAZINE HCL 25 MG/ML IJ SOLN: 6.2500 mg | INTRAMUSCULAR | Status: DC | PRN

## 2014-02-24 MED ORDER — LIDOCAINE HCL (CARDIAC) 20 MG/ML IV SOLN
INTRAVENOUS | Status: AC
  Filled 2014-02-24: qty 5

## 2014-02-24 MED ORDER — LIDOCAINE HCL (CARDIAC) 10 MG/ML IV SOLN
INTRAVENOUS | Status: DC | PRN
  Administered 2014-02-24: 50 mg via INTRAVENOUS

## 2014-02-24 MED ORDER — BELLADONNA ALKALOIDS-OPIUM 16.2-60 MG RE SUPP
RECTAL | Status: DC | PRN
  Administered 2014-02-24: 1 via RECTAL

## 2014-02-24 MED ORDER — TRIMETHOPRIM 100 MG PO TABS: 100.0000 mg | ORAL_TABLET | ORAL | Status: DC

## 2014-02-24 MED ORDER — BELLADONNA ALKALOIDS-OPIUM 16.2-60 MG RE SUPP
RECTAL | Status: AC
  Filled 2014-02-24: qty 1

## 2014-02-24 MED ORDER — DEXAMETHASONE SODIUM PHOSPHATE 10 MG/ML IJ SOLN
INTRAMUSCULAR | Status: DC | PRN
  Administered 2014-02-24: 5 mg via INTRAVENOUS

## 2014-02-24 MED ORDER — OXYCODONE-ACETAMINOPHEN 5-325 MG PO TABS: 1.0000 | ORAL_TABLET | Freq: Four times a day (QID) | ORAL | Status: DC | PRN

## 2014-02-24 MED ORDER — STERILE WATER FOR IRRIGATION IR SOLN
Status: DC | PRN
  Administered 2014-02-24: 6000 mL

## 2014-02-24 MED ORDER — PHENAZOPYRIDINE HCL 200 MG PO TABS: 200.0000 mg | ORAL_TABLET | Freq: Three times a day (TID) | ORAL | Status: DC | PRN

## 2014-02-24 MED ORDER — ACETAMINOPHEN 10 MG/ML IV SOLN
INTRAVENOUS | Status: DC | PRN
  Administered 2014-02-24: 1000 mg via INTRAVENOUS

## 2014-02-24 MED ORDER — PROPOFOL 10 MG/ML IV BOLUS
INTRAVENOUS | Status: DC | PRN
  Administered 2014-02-24: 130 mg via INTRAVENOUS

## 2014-02-24 MED ORDER — ONDANSETRON HCL 4 MG/2ML IJ SOLN
INTRAMUSCULAR | Status: AC
  Filled 2014-02-24: qty 2

## 2014-02-24 MED ORDER — FENTANYL CITRATE 0.05 MG/ML IJ SOLN
INTRAMUSCULAR | Status: DC | PRN
  Administered 2014-02-24: 100 ug via INTRAVENOUS
  Administered 2014-02-24 (×3): 50 ug via INTRAVENOUS

## 2014-02-24 MED ORDER — LACTATED RINGERS IV SOLN
INTRAVENOUS | Status: DC | PRN
  Administered 2014-02-24 (×2): via INTRAVENOUS

## 2014-02-24 MED ORDER — PROPOFOL 10 MG/ML IV BOLUS
INTRAVENOUS | Status: AC
  Filled 2014-02-24: qty 20

## 2014-02-24 MED ORDER — MEPERIDINE HCL 50 MG/ML IJ SOLN: 6.2500 mg | INTRAMUSCULAR | Status: DC | PRN

## 2014-02-24 MED ORDER — CIPROFLOXACIN IN D5W 400 MG/200ML IV SOLN
400.0000 mg | INTRAVENOUS | Status: AC
  Administered 2014-02-24: 400 mg via INTRAVENOUS

## 2014-02-24 MED ORDER — FENTANYL CITRATE 0.05 MG/ML IJ SOLN
INTRAMUSCULAR | Status: AC
  Filled 2014-02-24: qty 5

## 2014-02-24 MED ORDER — HYDROMORPHONE HCL PF 1 MG/ML IJ SOLN
INTRAMUSCULAR | Status: AC
  Filled 2014-02-24: qty 1

## 2014-02-24 MED ORDER — OXYCODONE HCL 5 MG/5ML PO SOLN
5.0000 mg | Freq: Once | ORAL | Status: DC | PRN
  Filled 2014-02-24: qty 5

## 2014-02-24 MED ORDER — OXYCODONE HCL 5 MG PO TABS: 5.0000 mg | ORAL_TABLET | Freq: Once | ORAL | Status: DC | PRN

## 2014-02-24 MED ORDER — ACETAMINOPHEN 10 MG/ML IV SOLN
1000.0000 mg | Freq: Once | INTRAVENOUS | Status: DC
Start: 2014-02-24 — End: 2014-02-24
  Filled 2014-02-24: qty 100

## 2014-02-24 MED ORDER — ONDANSETRON HCL 4 MG/2ML IJ SOLN
INTRAMUSCULAR | Status: DC | PRN
  Administered 2014-02-24: 4 mg via INTRAVENOUS

## 2014-02-24 MED ORDER — PHENYLEPHRINE HCL 10 MG/ML IJ SOLN
10.0000 mg | INTRAVENOUS | Status: DC | PRN
  Administered 2014-02-24: 10 ug/min via INTRAVENOUS

## 2014-02-24 MED ORDER — HYDROMORPHONE HCL PF 1 MG/ML IJ SOLN
0.2500 mg | INTRAMUSCULAR | Status: DC | PRN
  Administered 2014-02-24 (×4): 0.5 mg via INTRAVENOUS

## 2014-02-24 MED ORDER — CIPROFLOXACIN IN D5W 400 MG/200ML IV SOLN
INTRAVENOUS | Status: AC
  Filled 2014-02-24: qty 200

## 2014-02-24 SURGICAL SUPPLY — 36 items
BAG URINE DRAINAGE (UROLOGICAL SUPPLIES) ×3 IMPLANT
BNDG CONFORM 3 STRL LF (GAUZE/BANDAGES/DRESSINGS) ×1 IMPLANT
CATH FOLEY 2WAY SLVR  5CC 18FR (CATHETERS) ×2
CATH FOLEY 2WAY SLVR 5CC 18FR (CATHETERS) ×2 IMPLANT
CHARGE TECH PROCEDURE ONCURA (LABOR (TRAVEL & OVERTIME)) ×3 IMPLANT
DRAPE CAMERA CLOSED 9X96 (DRAPES) ×2 IMPLANT
DRAPE INCISE IOBAN 66X45 STRL (DRAPES) ×3 IMPLANT
DRAPE SURG 17X11 SM STRL (DRAPES) ×3 IMPLANT
DRSG TEGADERM 4X4.75 (GAUZE/BANDAGES/DRESSINGS) ×3 IMPLANT
DRSG TEGADERM 6X8 (GAUZE/BANDAGES/DRESSINGS) IMPLANT
GAS ARGON HIGH PRESSURE (MEDICAL GASES) ×3 IMPLANT
GAS HELIUM HIGH PRESSURE (MEDICAL GASES) ×3 IMPLANT
GAUZE SPONGE 2X2 8PLY STRL LF (GAUZE/BANDAGES/DRESSINGS) IMPLANT
GAUZE SPONGE 4X4 16PLY XRAY LF (GAUZE/BANDAGES/DRESSINGS) ×3 IMPLANT
GLOVE BIOGEL M STRL SZ7.5 (GLOVE) ×3 IMPLANT
GOWN ISOL BLUE XXL (GOWNS) ×3 IMPLANT
GOWN STRL REUS W/TWL XL LVL3 (GOWN DISPOSABLE) ×3 IMPLANT
GUIDEWIRE AMPLATZ STIFF 0.35 (WIRE) ×3 IMPLANT
KIT BASIN OR (CUSTOM PROCEDURE TRAY) ×3 IMPLANT
KIT ICE ROD PROCEDURE PRECISE (DISPOSABLE) ×3 IMPLANT
KIT SEEDNET PRECISE PROCEDURE (KITS) ×1 IMPLANT
LUBRICANT JELLY K Y 4OZ (MISCELLANEOUS) ×3 IMPLANT
MARKER SKIN DUAL TIP RULER LAB (MISCELLANEOUS) ×3 IMPLANT
NDL SPNL 18GX3.5 QUINCKE PK (NEEDLE) ×1 IMPLANT
NDL SPNL 20GX3.5 QUINCKE YW (NEEDLE) ×1 IMPLANT
NEEDLE SPNL 18GX3.5 QUINCKE PK (NEEDLE) ×3 IMPLANT
NEEDLE SPNL 20GX3.5 QUINCKE YW (NEEDLE) IMPLANT
PACK BASIC VI WITH GOWN DISP (CUSTOM PROCEDURE TRAY) ×3 IMPLANT
PLUG CATH AND CAP STER (CATHETERS) ×3 IMPLANT
SET IRRIG Y TYPE TUR BLADDER L (SET/KITS/TRAYS/PACK) ×3 IMPLANT
SHEET LAVH (DRAPES) ×3 IMPLANT
SPONGE GAUZE 2X2 STER 10/PKG (GAUZE/BANDAGES/DRESSINGS) ×2
SPONGE GAUZE 4X4 12PLY (GAUZE/BANDAGES/DRESSINGS) ×1 IMPLANT
SYRINGE 10CC LL (SYRINGE) ×3 IMPLANT
SYRINGE IRR TOOMEY STRL 70CC (SYRINGE) ×2 IMPLANT
TOWEL OR 17X26 10 PK STRL BLUE (TOWEL DISPOSABLE) ×3 IMPLANT

## 2014-02-24 NOTE — Anesthesia Postprocedure Evaluation (Signed)
Anesthesia Post Note  Patient: William [name]  Procedure(s) Performed: Procedure(s) (LRB): CRYO ABLATION PROSTATE (N/A)  Anesthesia type: General  Patient location: PACU  Post pain: Pain level controlled  Post assessment: Post-op Vital signs reviewed  Last Vitals: BP 117/70  Pulse 58  Temp(Src) 36.7 C (Oral)  Resp 18  SpO2 99%  Post vital signs: Reviewed  Level of consciousness: sedated  Complications: No apparent anesthesia complications

## 2014-02-24 NOTE — Anesthesia Preprocedure Evaluation (Signed)
Anesthesia Evaluation  Patient identified by MRN, date of birth, ID band Patient awake    Reviewed: Allergy & Precautions, H&P , NPO status , Patient's Chart, lab work & pertinent test results  Airway Mallampati: II TM Distance: >3 FB Neck ROM: Full    Dental  (+) Chipped, Dental Advisory Given,    Pulmonary shortness of breath, asthma , sleep apnea ,  breath sounds clear to auscultation  Pulmonary exam normal       Cardiovascular hypertension, Pt. on medications and Pt. on home beta blockers + CAD, + Peripheral Vascular Disease (AAA) and +CHF negative cardio ROS  + dysrhythmias Ventricular Tachycardia + Cardiac Defibrillator Rhythm:Regular Rate:Normal  Sustained VT (ventricular tachycardia) In the past attempts at discontinuation of a mural have led to recurrent ventricular tachycardia per on the other hand he has not been on a tiny dose of amiodarone without any recurrence and wishes to try to wean off this medication again. Since he has a defibrillator protection this is not unreasonable, although I think it is quite possible we'll have the same outcome. He feels poorly while taking amiodarone and has felt some improvement after reducing the dose several months ago. He is also interested in reducing the beta blocker dose which I agree is contributing to his dizziness. I have told him that I am definitely reluctant to perform too many changes at the same time and will continue the same beta blocker dose for now.   Nonischemic cardiomyopathy EF approximately 45% by LV angiography 2005 50% by echo 2005, 45% by nuclear scintigram April 2013. He does not have any clinical signs of hypervolemia/acute heart failure. It should be noted that his nuclear stress test in 2013 probably showed inferior wall scar without ischemia. He is known to have sequential moderate stenoses in his right coronary artery by the angiogram performed in 2005 and it is  possible that there has been interval occlusion of the right coronary artery. He prefers noninvasive management. Since he does not have angina or reversible changes this is quite reasonable.   Coronary atherosclerosis Angiography 2005 shows 30-40% proximal first diagonal artery stenosis, 50% second diagonal artery stenosis, 50% left circumflex artery stenosis, sequential 40% stenoses in the proximal, mid and distal right coronary artery   Neuro/Psych negative neurological ROS  negative psych ROS   GI/Hepatic negative GI ROS, Neg liver ROS,   Endo/Other  negative endocrine ROS  Renal/GU negative Renal ROS  negative genitourinary   Musculoskeletal negative musculoskeletal ROS (+)   Abdominal   Peds  Hematology negative hematology ROS (+)   Anesthesia Other Findings   Reproductive/Obstetrics negative OB ROS                           Anesthesia Physical  Anesthesia Plan  ASA: III  Anesthesia Plan: General   Post-op Pain Management:    Induction: Intravenous  Airway Management Planned: Oral ETT  Additional Equipment:   Intra-op Plan:   Post-operative Plan: Extubation in OR  Informed Consent: I have reviewed the patients History and Physical, chart, labs and discussed the procedure including the risks, benefits and alternatives for the proposed anesthesia with the patient or authorized representative who has indicated his/her understanding and acceptance.   Dental advisory given  Plan Discussed with: CRNA  Anesthesia Plan Comments:         Anesthesia Quick Evaluation

## 2014-02-24 NOTE — Progress Notes (Signed)
Pt up ambulated in hall with stand by assist.  Pt is weak but doing ok.  Pt does state he is moving slower b/c of his scrotum being swollen.  He is overall doing well and is ready to go home.

## 2014-02-24 NOTE — Transfer of Care (Signed)
Immediate Anesthesia Transfer of Care Note  Patient: William [name]  Procedure(s) Performed: Procedure(s): CRYO ABLATION PROSTATE (N/A)  Patient Location: PACU  Anesthesia Type:General  Level of Consciousness: awake, alert , patient cooperative and responds to stimulation  Airway & Oxygen Therapy: Patient Spontanous Breathing and Patient connected to face mask oxygen  Post-op Assessment: Report given to PACU RN, Post -op Vital signs reviewed and stable and Patient moving all extremities X 4  Post vital signs: stable  Complications: No apparent anesthesia complications

## 2014-02-24 NOTE — Progress Notes (Signed)
Pt more alert, O2 mask d/c'ed; pt states catheter pain is less

## 2014-02-24 NOTE — Interval H&P Note (Signed)
History and Physical Interval Note:  02/24/2014 7:37 AM  William [name]  has presented today for surgery, with the diagnosis of PROSTATE CANCER   The various methods of treatment have been discussed with the patient and family. After consideration of risks, benefits and other options for treatment, the patient has consented to  Procedure(s): CRYO ABLATION PROSTATE (N/A) as a surgical intervention .  The patient's history has been reviewed, patient examined, no change in status, stable for surgery.  I have reviewed the patient's chart and labs.  Questions were answered to the patient's satisfaction.     Ailene Rud

## 2014-02-24 NOTE — H&P (Signed)
Reason For Visit Consult   Active Problems Problems  1. Prostate cancer (68)  History of Present Illness     80 yo male returns today for consult after recent outpatient prostate biopsy on 11/22/13. It was done as an outpatient procedure because he wasn't able to tolerate rectal exam. Originally referred by Dr. Osborne Casco for further evaluation of an elevated PSA of 6.134 on 08/31/13. He states he has noticed a change in his urinary habits as well.    1. chronic constipation: Reita Chard MOM + prn stool softeners each day in order to have daily bowel movements.   2. Severe internal hemorrhoids: bleed easily. Cannot allow finger rectal exam.   3. PSA elevation: His psa has risen from 4.2 in 2013, and 4.148 i May, 2014; but psa now 6.134. Pt rides an exercise bicycle for therapy for his back fracture L-1. IPSS=10.  4. Sleep apnea    10/12/13 PSA - 5.02/10%  03/02/13 PSA - 4.148  08/20/12 PSA - 4.208   Past Medical History Problems  1. History of arthritis (V13.4) 2. History of cardiac arrhythmia (V12.59) 3. History of diverticulitis of colon (V12.79) 4. History of hypertension (V12.59) 5. History of sleep apnea (V13.89)  Surgical History Problems  1. History of Appendectomy 2. History of Back Surgery 3. History of Biopsy Of The Prostate Needle 4. History of Breast Surgery 5. History of Colon Surgery 6. History of Complete Colonoscopy 7. History of Inguinal Hernia Repair 8. History of Total Hip Replacement  Current Meds 1. Amiodarone HCl TABS;  Therapy: (Recorded:10Mar2015) to Recorded 2. Claritin CAPS;  Therapy: (Recorded:30Dec2014) to Recorded 3. Metoprolol Tartrate 25 MG Oral Tablet; TAKE 1 TABLET TWICE DAILY;  Therapy: (Recorded:30Dec2014) to Recorded 4. Motrin TABS;  Therapy: (Recorded:30Dec2014) to Recorded 5. Vytorin TABS;  Therapy: (Recorded:30Dec2014) to Recorded  Allergies No Known Allergies  1. No Known Allergies  Family History Problems  1.  Family history of cerebral infarction (V17.1) : Mother, Father 2. Family history of colon cancer (V16.0)  Social History Problems    Alcohol use   Caffeine use (V49.89)   Father deceased   Married   Never a smoker   Number of children  Review of Systems  Genitourinary: urinary frequency, feelings of urinary urgency, nocturia, difficulty starting the urinary stream and weak urinary stream, but no dysuria, no incontinence, urinary stream does not start and stop, no incomplete emptying of bladder, no post-void dribbling and initiating urination does not require straining.  Gastrointestinal: constipation, but no nausea, no vomiting, no heartburn and no diarrhea.  Constitutional: feeling tired (fatigue) and recent ~Ulb weight loss, but no fever and no night sweats.  Integumentary: no new skin rashes or lesions and no pruritus.  Eyes: no blurred vision and no diplopia.  ENT: no sore throat and no sinus problems.  Hematologic/Lymphatic: a tendency to easily bruise, but no swollen glands.  Cardiovascular: no chest pain and no leg swelling.  Respiratory: no shortness of breath and no cough.  Endocrine: no polydipsia.  Musculoskeletal: back pain, but no joint pain.  Neurological: no headache and no dizziness.  Psychiatric: no anxiety and no depression.    Vitals Vital Signs [Data Includes: Last 1 Day]  Recorded: [id number] 04:31PM  Blood Pressure: 106 / 62 Temperature: 97.9 F Heart Rate: 75  Physical Exam Constitutional: Well nourished and well developed . No acute distress.  ENT:. The ears and nose are normal in appearance.  Neck: The appearance of the neck is normal and no neck mass is  present.  Pulmonary: No respiratory distress.  Cardiovascular:. No peripheral edema.  Abdomen: The abdomen is soft and nontender. No masses are palpated. No CVA tenderness. No hernias are palpable. No hepatosplenomegaly noted.  Rectal: Rectal exam demonstrates an internal hemorrhoid, but normal  sphincter tone. Severe rectal pain from hemorrhoids. UNABLE TO PALPATE PROSTATE DUE TO HEMORRHOIDAL PAIN. The prostate is tender.  Genitourinary: Examination of the penis demonstrates no discharge, no masses, no adherence of the prepuce, no lesions and a normal meatus. The penis is circumcised. The scrotum is without lesions. Examination of the right scrotum demonstrates no mass. Examination of the left scrotum demostrates no mass. The right epididymis is palpably normal and non-tender. The left epididymis is palpably normal and non-tender. The right testis is palpably normal, non-tender and without masses. The left testis is normal, non-tender and without masses.  Lymphatics: The femoral and inguinal nodes are not enlarged or tender.  Skin: Normal skin turgor, no visible rash and no visible skin lesions.  Neuro/Psych:. Mood and affect are appropriate.    Results/Data Urine [Data Includes: Last 1 Day]   [id number]  COLOR YELLOW   APPEARANCE CLEAR   SPECIFIC GRAVITY 1.025   pH 6.0   GLUCOSE NEG mg/dL  BILIRUBIN NEG   KETONE NEG mg/dL  BLOOD NEG   PROTEIN NEG mg/dL  UROBILINOGEN 0.2 mg/dL  NITRITE NEG   LEUKOCYTE ESTERASE NEG   Selected Results  CT-ABD/PELVIS W/W/O CONTRAST [id number] 12:00AM Carolan Clines   Test Name Result Flag Reference  CT-ABD/PELVIS W/W/O CONTRAST (Report)    ** RADIOLOGY REPORT BY [facility] **   CLINICAL DATA: Prostate cancer.  EXAM: CT ABDOMEN AND PELVIS WITHOUT AND WITH CONTRAST  TECHNIQUE: Multidetector CT imaging of the abdomen and pelvis was performed without contrast material in one or both body regions, followed by contrast material(s) and further sections in one or both body regions.  CONTRAST: 125 cc Isovue 300.  COMPARISON: None.  FINDINGS: Pre contrast imaging shows no renal, ureteral, or bladder stones. Imaging after IV contrast administration shows bilateral well-defined low-density renal lesions. In the posterior  aspect of the right upper renal pole, a 3.2 x 3.2 cm water density lesion has a fine internal septation with a thin dystrophic calcification associated. 3.9 x 3.9 cm well-defined water density lesion in the upper pole of the left kidney extends centrally into the central sinus. This has no CT evidence for septation, mural nodularity, or mural irregularity. A second 15 mm well-defined water density lesion is seen in the interpolar left kidney. Move  Delayed imaging shows good opacification of both intrarenal collecting systems with no evidence for intraluminal filling defect or mass lesion. Delayed imaging was performed down to the level of the iliac crests and the opacified portions of the proximal ureters are normal in this region. No delayed imaging was performed through the pelvis.  Calcified granulomas identified in the left liver. Liver and spleen are otherwise unremarkable. The stomach, duodenum, and adrenal glands are unremarkable. The pancreas is atrophic and a 9 mm cystic lesion is identified in the body of the pancreas. There is no associated dilatation of the main pancreatic duct.  Innumerable tiny calcified stones are seen in the lumen of the gallbladder. No associated biliary dilatation. No pericholecystic fluid or edema.  Atherosclerotic calcification is seen in the wall of the abdominal aorta which measures up to 3.7 cm in maximum diameter. No evidence for free fluid or lymphadenopathy in the abdomen.  Imaging through the pelvis shows no  free intraperitoneal fluid. There is no pelvic sidewall lymphadenopathy. The prostate gland is unremarkable. Scattered diverticular disease is seen in the left colon without diverticulitis. Patient is status post right hemicolectomy. The  Bone windows reveal no worrisome lytic or sclerotic osseous lesions. There is marked compression deformity of L1 vertebral body with 75% loss of height anteriorly and 50% loss of height  posteriorly.  IMPRESSION: No evidence for pelvic lymphadenopathy in this patient with a history of prostate cancer.  Bilateral well-defined low-density renal lesions, most likely related to cysts. The right renal cyst has associated septation with fine calcification. This is most likely a Bosniak 2 lesion. MRI could be used to more definitively characterize as clinically warranted. This lesion could be reassessed at the time of followup MRI recommendation below.  Atrophy of the pancreatic parenchyma with a 9 mm cystic lesion in the body of the pancreas. Statistically, this is most likely a benign finding. Follow-up MRI without and with contrast in 12 months is recommended to reassess. This recommendation follows ACR consensus guidelines: Managing Incidental Findings on Abdominal CT: White Paper of the ACR Incidental Findings Committee. J Am Coll Radiol 2010;7:754-773  Cholelithiasis.   Electronically Signed  By: Misty Stanley M.D.  On: 12/07/2013 12:54   TESTOSTERONE [id number] 09:19AM Carolan Clines  SPECIMEN TYPE: BLOOD   Test Name Result Flag Reference  TESTOSTERONE, TOTAL 236 ng/dL L 300-890  TANNER STAGE       MALE              MALE               I              < 30 NG/DL        < 10 NG/DL               II             < 150 NG/DL       < 30 NG/DL               III            100-320 NG/DL     < 35 NG/DL               IV             200-970 NG/DL     15-40 NG/DL               V/ADULT        300-890 NG/DL     10-70 NG/DL   BUN & CREATININE [id number] 09:18AM Carolan Clines  SPECIMEN TYPE: BLOOD   Test Name Result Flag Reference  CREATININE 1.16 mg/dL  0.50-1.50  BUN 18 mg/dL  6-23  Est GFR, African American 66 mL/min    Est GFR, NonAfrican American 58 mL/min L   THE ESTIMATED GFR IS A CALCULATION VALID FOR ADULTS (>=21 YEARS OLD) THAT USES THE CKD-EPI ALGORITHM TO ADJUST FOR AGE AND SEX. IT IS   NOT TO BE USED FOR CHILDREN, PREGNANT WOMEN, HOSPITALIZED  PATIENTS,    PATIENTS ON DIALYSIS, OR WITH RAPIDLY CHANGING KIDNEY FUNCTION. ACCORDING TO THE NKDEP, EGFR >89 IS NORMAL, 60-89 SHOWS MILD IMPAIRMENT, 30-59 SHOWS MODERATE IMPAIRMENT, 15-29 SHOWS SEVERE IMPAIRMENT AND <15 IS ESRD.   Assessment Assessed  1. Benign localized hyperplasia of prostate with urinary obstruction (782.95,621.30) 2. Prostate cancer (185)  ( 60 minute face to face consultation)   81 yo married male  Karnofsky 100, ECOG 1, with psa 6.1, and T=236. Pt was insistant on knowing if he had CaO and notes that both his parents lived extended lives to > 90-78yr. He could not have bx in office because of severe recta stenosis, which was dilated in the OR, with excellent results.   He has been found to have multifocal high grade and mid-grade prostate cancer, with some biopsies with high cancer volumes:   G 5+3=8 caP in 90% of Right medial apex;  G 4+3=7 caP in 30% of Right lateral apical cores;  G 4+ 3 in Left apex lateral ( 40% of 1 core);   G 3+4=7 in 10% of 1 bx of L apex medial;   and G 4+3=7 caP in 5% of Left mid medial biopsies.     Because of his grade and high volume disease, he was recommended for CT scan to R/o metastatic disease. NOI mets were found, but incidental findings were:  1. multiple gall stones without evidence of inflammation;   2. 961mcyst in the body of the pancreas. Recommended for MRI in the future ( pt has defibrillator-so cannot have MRI-will need f/u CT)  3. Bilateral benign renal cysts ( Bosniak-2)  4. 3.5m66max diameter abdominal wall. No AAA.   NO indication for bone scan.   We have had a thorough discussion of the diagnosis of prostate cancer, and of Gleason grading and staging of prostate disease. He has multifocal, high grade and high volume local disease. At 85 39s old, it is not surprising to find prostate cancer-> 80% of 80 12 men will be found to have the disease per autopsy studies. However, the pt has insisted that he know  what he has so that he can understand his options for treatment, including "no " treatment, delayed hormone treatment; Radical prostatectomy, radiation therapy, cryotherapy, hormone therapy, chemotherapy, and new therapies.    We have discussed no treatment, but the patient definitely wants to be under therapy. We have offered delayed hormone therapy, checking his ps q 4 months, with anticipation of metastatic disease in 5- 10 yrs, which he has considered.    In addition, we have considered radical surgery for prostate cancer,noting a [place] showing how well octogenerians have done with local extirpation of the gland. However, I have recommended against this, and the family has agreed.   I have discussed alternative forms of radiation therapy, with sandwich hormone therapy, but I do not think his cancer will be amenable to either EBRT or seed therapy, even with addition of hormone sandwich approach. He could have IMRT, and may want a 2nd opinion with Dr. MurValere Drossgarding high dose radiation therapy. However, I am cautious of this approach because of the delayed side effects of urinary frequency, urgency, and bleeding.    Cryotherapy offers monotherapy with lighter anesthesia than radical surgery, avoids radiation, and preserves native architecture of the prostate, with little chance for incontinence, or bleeding. He has ED, and is not concerned with losing any erectile ability. He understands that no approach will be 100%, but could have hormone Rx if he recurs, or could have repeat cryotherapy. We have reviewed my experience with cryotherapy ( since 2008), and have offered to send him to [facility]-Dr. PolFranchot Gallof he prefers- for consultation.   His family will review the [place] and also the IMRT on the web, and consider their alternatives, and call me back and let me know how he would like to proceed. He understands that  his cancer, while "aggressive" will not "jump" in the nest week and he has  time to get his opinions.   Plan Elevated prostate specific antigen (PSA)  1. Follow-up Office  Follow-up  Status: Canceled Health Maintenance  2. UA With REFLEX; [Do Not Release]; Status:Resulted - Requires Verification;   Done:  [id number] 04:18PM  1. Consider alternatives and call if he wants to get Rad Rx 2nd opinion, or go to [facility], or be scheduled for cryotherapy  2. Send copy of report to Dr. Osborne Casco.   Discussion/Summary cc: Domenick Gong, MD     Signatures Electronically signed by : Carolan Clines, M.D.; Dec 21 2013  7:00PM EST

## 2014-02-24 NOTE — Progress Notes (Signed)
Pt has rec'd pain meds for c/o catheter pain; pt states "I just don't feel good"; pt encouraged to rest and sleep and will reassess in 30-45 mins

## 2014-02-24 NOTE — Discharge Instructions (Signed)
INSTRUCTIONS AFTER CRYOABLATION OF THE PROSTATE  Normal Findings After Cryoablation:   You may experience a number of symptoms after the cryoablation which occur in  some patients.  There is no cause for alarm should this happen.  These    symptoms include:  -Blood in the urine.  When you are discharged after your surgery, your urine   will have some blood in it and may appear red.  This occurs to some degree in all  patients after cryoablation and is not cause for alarm.  Your urine should clear  approximately 24 hours after the procedure, but may persist for quite a while   longer.  -Scrotal and penile swelling and bruising.  This occurs about 2-3 days after   the cryoablation and is caused by tissue swelling which temporarily blocks the   drainage of lymph.  This is painless and resolves in less than one week.  Ice   packs and lying down for short periods of time during the day will improve the   swelling.  -Small amounts of bloody discharge from the end of your penis.  This can   occur for up to 6 weeks after the procedure and is not cause for alarm.  It is due   to some discharge from the urethra in the area of the prostate.  -Some numbness in the head of the penis.  Occasionally, when a large   amount of freezing has been performed during the procedure, the nerve which   supplies sensation to one or both sides of the head of the penis may be affected.   The sensation returns after a number of months.  Call your doctor at [phone] if any of the following occur:   -If you have any pain, fever, or chills.  -If your foley catheter or suprapubic tube is not draining urine.  -If there is decreasing urinary stream.  This may indicate sloughing of some   dead tissue in the area of the prostate near the opening to the bladder.  It may   clear on its own but,  if the problem becomes severe, it may require removal of   the dead tissue through a cystoscope.  -If you have diarrhea after urination or  foul-smelling urine.  These    symptoms may indicate an urethrorectal fistula, which is a hole between the   bladder channel and the rectum.  This should be investigated by your doctor.  -If you have any questions or problems.   Diet:  Resume your normal diet.   If you become constipated, you may try    over-the-counter remedies such as Milk of Magnesia.  If you are nauseated,   vomiting or feel bloated, notify your doctor's office.  DO NOT GIVE YOURSELF  ANY ENEMAS OR RECTAL MEDICATIONS.  THE RECTAL WALL IS THIN  AFTER CRYOABLATION.  Activity:   -You may have swelling and bruising of the penis and scrotum.  Apply ice packs   to the area behind the scrotum intermittently for 24-48 hours after your surgery to   keep the swelling down.  Wearing an athletic supporter (jock strap) might also   help.  -Lying flat on your back will also help decrease the swelling.  You may find   when you are sitting up or walking around that the swelling increases.   -You will have  puncture wounds behind your scrotum making it painful to sit   down.  Use a hemorrhoid donut to sit  on if needed.  -You may shower on the second day after surgery.  -There are no lifting or driving restrictions.  Use caution while the suprapubic tube   is in place.   Medications:  -You may resume your preoperative medications, except aspirin and other blood   thinning agents.  Unless otherwise instructed, resume your aspirin after your   suprapubic tube is removed.  If you are taking Coumadin or other blood thinners,   please discuss when to restart these medications with your surgeon.  -Unless otherwise ordered, you will be given prescriptions for the following   medications which you will need to take after your procedure:   *Antibiotics -  to prevent infection while your suprapubic tube is in place.   *Anti-inflammatory - to prevent pain and to decrease the inflammation in    the area of the prostate.  You may take ibuprofen (Motrin,  Advil),     acetaminophen (Tylenol) or naproxen (Aleve) as needed for discomfort.   Suprapubic tube:  You will be discharged from the hospital with either a suprapubic or foley catheter in place which will drain into either a leg or bedside bag.  If you have a suprapubic tube, do the following:   STEP 1.  Leave the tube valve open and draining for the first 4 days.  STEP 2.  On day 5, turn the valve to the off position.  STEP 3.  Empty the drainage bag.  STEP 4.  When you feel the urge to urinate, urinate through your penis.  STEP 5.  After urinating (or, if you are unable to urinate), open the valve and drain  the urine into the drainage bag.  Empty the drainage bag into a measuring device   (urinal, measuring cup).  Record the amount of urine on a piece of paper for your   doctor to view at your follow-up appointment.  STEP 6. Close the valve to the off position.  Keep the valve closed until you have   the urge to urinate.  Then follow Steps 4 and 5 again.   Call your doctor's office at [phone] for an appointment   Patient Signature:  ________________________________________________________  Nurse's Signature:  ________________________________________________________

## 2014-02-24 NOTE — Op Note (Signed)
Pre-operative diagnosis :   T1 C. adenocarcinoma prostate  Postoperative diagnosis:  Same  Operation:  Cryotherapy the prostate  Surgeon:  S. Gaynelle Arabian, MD  First assistant:  None  Anesthesia:  General LMA  Preparation:  After appropriate preanesthesia, the patient was brought to the operative room, placed on the operating table in the dorsal supine position where general LMA anesthesia was introduced. He was then replaced in the dorsal lithotomy position with pubis was prepped with Betadine solution and draped in usual fashion. The arm band was double checked. The history was double checked. Prostate ultrasound probe was checked, and placed within the rectum. The prostate was noted to be somewhat eccentric, larger on the left side the right side. The urethra was noted to also follow a somewhat eccentric course, and this was taken into consideration of placing the cryotherapy probes. Foley catheter was placed, with 10 cc in the balloon, and 50 cc of saline the bladder.  Review history:  80 yo male returns today for consult after recent outpatient prostate biopsy on 11/22/13. It was done as an outpatient procedure because he wasn't able to tolerate rectal exam. Originally referred by Dr. Osborne Casco for further evaluation of an elevated PSA of 6.134 on 08/31/13. He states he has noticed a change in his urinary habits as well.  1. chronic constipation: Reita Chard MOM + prn stool softeners each day in order to have daily bowel movements.  2. Severe internal hemorrhoids: bleed easily. Cannot allow finger rectal exam.  3. PSA elevation: His psa has risen from 4.2 in 2013, and 4.148 i May, 2014; but psa now 6.134. Pt rides an exercise bicycle for therapy for his back fracture L-1. IPSS=10.  4. Sleep apnea      Statement of  Likelihood of Success: Excellent. TIME-OUT observed.:  Procedure:  With real-time transrectal ultrasonography, the prostate was evaluated. 5 separate rows of probes were placed,  with care taken to avoid injury to the urethra, rectum, and bladder. Foley catheter was replaced in the bladder, with 10 cc in the balloon, and 50 cc of saline in the bladder. This allowed for better delineation of the bladder wall. 5 rows of cryotherapy probes were placed, in order to best control the ice ball formation. Two separate temperature probes were placed, in [place]'s  fascia, as well as in the perirectal space.  Following placement of the probes, flexible cystoscopy was accomplished and showed normal prosthetic urethra, with no evidence of probe within the urethra. In addition, the bladder neck was normal. The bladder base was normal, with no evidence of probe within the bladder. There was no trabeculation, no cellules, and no bladder tumor, and no stone. A floppy-tipped stiff guidewire was then passed into the bladder, and over this guidewire, a transurethral warming device was placed, to continuous warming throughout the case. He was left in place for 20 minutes following the case.  The patient then underwent 2 separate freeze-thaw cycles, with both active, and active/passive thaw cycles. This was accomplished with constant ultrasound visualization of the ice ball, and constant visualization of the temperature probes.  Following the second passive thaw cycle, the ice rods were removed. The urethral warming device was left in place for 20 extra minutes, at which time it was removed, and Foley catheter was replaced.  The patient was given IV Tylenol, and IV Toradol. He also received a B. and O. suppository. He was awakened, and taken to recovery room in good condition. He will be allowed to be  discharged home with Foley catheter to straight drainage.

## 2014-02-25 ENCOUNTER — Encounter (HOSPITAL_COMMUNITY): Payer: Self-pay | Admitting: Urology

## 2014-03-04 DIAGNOSIS — C61 Malignant neoplasm of prostate: Secondary | ICD-10-CM | POA: Diagnosis not present

## 2014-03-04 DIAGNOSIS — N3941 Urge incontinence: Secondary | ICD-10-CM | POA: Diagnosis not present

## 2014-03-29 DIAGNOSIS — E785 Hyperlipidemia, unspecified: Secondary | ICD-10-CM | POA: Diagnosis not present

## 2014-03-29 DIAGNOSIS — Z1331 Encounter for screening for depression: Secondary | ICD-10-CM | POA: Diagnosis not present

## 2014-03-29 DIAGNOSIS — R972 Elevated prostate specific antigen [PSA]: Secondary | ICD-10-CM | POA: Diagnosis not present

## 2014-03-29 DIAGNOSIS — G4733 Obstructive sleep apnea (adult) (pediatric): Secondary | ICD-10-CM | POA: Diagnosis not present

## 2014-03-29 DIAGNOSIS — I4891 Unspecified atrial fibrillation: Secondary | ICD-10-CM | POA: Diagnosis not present

## 2014-03-29 DIAGNOSIS — I10 Essential (primary) hypertension: Secondary | ICD-10-CM | POA: Diagnosis not present

## 2014-03-29 DIAGNOSIS — R7301 Impaired fasting glucose: Secondary | ICD-10-CM | POA: Diagnosis not present

## 2014-03-29 DIAGNOSIS — I4729 Other ventricular tachycardia: Secondary | ICD-10-CM | POA: Diagnosis not present

## 2014-03-29 DIAGNOSIS — I472 Ventricular tachycardia: Secondary | ICD-10-CM | POA: Diagnosis not present

## 2014-04-11 ENCOUNTER — Telehealth: Payer: Self-pay | Admitting: Cardiovascular Disease

## 2014-04-11 NOTE — Telephone Encounter (Signed)
kristen can you explain the interaction to the patient please.

## 2014-04-11 NOTE — Telephone Encounter (Signed)
Spoke with Mrs. Poarch, interaction between Myrbetriq and metoprolol involves CYP 2D6 and has potential to increase blood pressure.  Per spouse, his BP is usually low, 628-315V systolic.  I advised that they just monitory his BP for the first couple of week on the combination, but don't expect this to be a problem.  Wife stated she will fill Myrbetriq prescription.

## 2014-04-11 NOTE — Telephone Encounter (Signed)
Please call,pt have had prostate surgery and Dr Gaynelle Arabian have presctibed  Myrbetriq 25 mg.She said was told told that this might interact with his Metoprolol. Please advise pt on what to do?

## 2014-04-21 DIAGNOSIS — L820 Inflamed seborrheic keratosis: Secondary | ICD-10-CM | POA: Diagnosis not present

## 2014-04-21 DIAGNOSIS — D235 Other benign neoplasm of skin of trunk: Secondary | ICD-10-CM | POA: Diagnosis not present

## 2014-04-21 DIAGNOSIS — L819 Disorder of pigmentation, unspecified: Secondary | ICD-10-CM | POA: Diagnosis not present

## 2014-04-21 DIAGNOSIS — L570 Actinic keratosis: Secondary | ICD-10-CM | POA: Diagnosis not present

## 2014-04-22 ENCOUNTER — Other Ambulatory Visit: Payer: Self-pay | Admitting: Cardiovascular Disease

## 2014-04-22 DIAGNOSIS — N63 Unspecified lump in unspecified breast: Secondary | ICD-10-CM | POA: Diagnosis not present

## 2014-04-22 NOTE — Telephone Encounter (Signed)
Rx was sent to pharmacy electronically. 

## 2014-05-25 ENCOUNTER — Ambulatory Visit (INDEPENDENT_AMBULATORY_CARE_PROVIDER_SITE_OTHER): Payer: Medicare Other | Admitting: *Deleted

## 2014-05-25 DIAGNOSIS — I428 Other cardiomyopathies: Secondary | ICD-10-CM

## 2014-05-25 DIAGNOSIS — I429 Cardiomyopathy, unspecified: Secondary | ICD-10-CM

## 2014-05-25 LAB — MDC_IDC_ENUM_SESS_TYPE_REMOTE
Battery Remaining Longevity: 136 mo
Brady Statistic RV Percent Paced: 0.07 %
Date Time Interrogation Session: 20150812073425
HIGH POWER IMPEDANCE MEASURED VALUE: 68 Ohm
HighPow Impedance: 133 Ohm
Lead Channel Impedance Value: 418 Ohm
Lead Channel Pacing Threshold Amplitude: 0.625 V
Lead Channel Pacing Threshold Pulse Width: 0.4 ms
Lead Channel Sensing Intrinsic Amplitude: 4.5 mV
Lead Channel Sensing Intrinsic Amplitude: 4.5 mV
MDC IDC MSMT BATTERY VOLTAGE: 3.11 V
MDC IDC SET LEADCHNL RV PACING AMPLITUDE: 2.5 V
MDC IDC SET LEADCHNL RV PACING PULSEWIDTH: 0.4 ms
MDC IDC SET LEADCHNL RV SENSING SENSITIVITY: 0.3 mV
MDC IDC SET ZONE DETECTION INTERVAL: 400 ms
MDC IDC SET ZONE DETECTION INTERVAL: 450 ms
Zone Setting Detection Interval: 240 ms
Zone Setting Detection Interval: 300 ms

## 2014-05-25 NOTE — Progress Notes (Signed)
Remote ICD transmission.   

## 2014-05-31 DIAGNOSIS — C61 Malignant neoplasm of prostate: Secondary | ICD-10-CM | POA: Diagnosis not present

## 2014-06-06 DIAGNOSIS — N3941 Urge incontinence: Secondary | ICD-10-CM | POA: Diagnosis not present

## 2014-06-06 DIAGNOSIS — C61 Malignant neoplasm of prostate: Secondary | ICD-10-CM | POA: Diagnosis not present

## 2014-06-07 ENCOUNTER — Telehealth: Payer: Self-pay | Admitting: Cardiovascular Disease

## 2014-06-07 NOTE — Telephone Encounter (Signed)
Unfortunately, Vesicare can sometimes cause QT prolongation on ECG and is probably not a wise choice in a patient like him (on Amiodarone for VT, QT already on long side). If there are no good alternative medications and he does need to take it, please have him come in for an ecg after taking it for 3-4 days

## 2014-06-07 NOTE — Telephone Encounter (Signed)
Deferred to Dr. Sallyanne Kuster for review

## 2014-06-07 NOTE — Telephone Encounter (Signed)
Pt's wife called in stating that Dr. Gaynelle Arabian just started him on Vesicare and one of the side effects is an regular heartbeat. She would like to know is this a medication he should be taking . Please call  Thanks

## 2014-06-07 NOTE — Telephone Encounter (Signed)
Informed patient of Dr. Victorino December recommendations. Dr. Sallyanne Kuster also sent a message to Dr. Gaynelle Arabian. I  Recommended for the patient to also do a follow up call to Dr. Gaynelle Arabian. If the Vesicare is not changed then he is to call our office back to schedule a EKG in 3-4 days after starting the Vesicare per Dr. Sallyanne Kuster. Patient voiced his understanding of these instructions.

## 2014-06-14 ENCOUNTER — Encounter: Payer: Self-pay | Admitting: Cardiology

## 2014-06-16 ENCOUNTER — Telehealth (HOSPITAL_COMMUNITY): Payer: Self-pay | Admitting: *Deleted

## 2014-06-17 ENCOUNTER — Encounter: Payer: Self-pay | Admitting: Cardiovascular Disease

## 2014-06-22 ENCOUNTER — Encounter (HOSPITAL_COMMUNITY): Payer: Medicare Other

## 2014-06-28 ENCOUNTER — Telehealth: Payer: Self-pay | Admitting: Cardiovascular Disease

## 2014-06-29 ENCOUNTER — Encounter (HOSPITAL_COMMUNITY): Payer: Medicare Other

## 2014-07-01 ENCOUNTER — Ambulatory Visit (HOSPITAL_COMMUNITY)
Admission: RE | Admit: 2014-07-01 | Discharge: 2014-07-01 | Disposition: A | Discharge status: Home / Self Care | Payer: Medicare Other | Source: Ambulatory Visit | Attending: Cardiology | Admitting: Cardiology

## 2014-07-01 DIAGNOSIS — I714 Abdominal aortic aneurysm, without rupture, unspecified: Secondary | ICD-10-CM | POA: Diagnosis not present

## 2014-07-01 DIAGNOSIS — R9389 Abnormal findings on diagnostic imaging of other specified body structures: Secondary | ICD-10-CM | POA: Insufficient documentation

## 2014-07-01 NOTE — Progress Notes (Signed)
Abdominal Aortic Duplex Completed °Brianna L Mazza,RVT °

## 2014-07-01 NOTE — Telephone Encounter (Signed)
Closed encounter °

## 2014-07-04 ENCOUNTER — Other Ambulatory Visit: Payer: Self-pay | Admitting: *Deleted

## 2014-07-04 DIAGNOSIS — I714 Abdominal aortic aneurysm, without rupture, unspecified: Secondary | ICD-10-CM

## 2014-07-07 DIAGNOSIS — C61 Malignant neoplasm of prostate: Secondary | ICD-10-CM | POA: Diagnosis not present

## 2014-07-07 DIAGNOSIS — R351 Nocturia: Secondary | ICD-10-CM | POA: Diagnosis not present

## 2014-07-07 DIAGNOSIS — N3941 Urge incontinence: Secondary | ICD-10-CM | POA: Diagnosis not present

## 2014-07-20 DIAGNOSIS — C61 Malignant neoplasm of prostate: Secondary | ICD-10-CM | POA: Diagnosis not present

## 2014-07-20 DIAGNOSIS — N3942 Incontinence without sensory awareness: Secondary | ICD-10-CM | POA: Diagnosis not present

## 2014-07-20 DIAGNOSIS — N3941 Urge incontinence: Secondary | ICD-10-CM | POA: Diagnosis not present

## 2014-07-25 DIAGNOSIS — Z23 Encounter for immunization: Secondary | ICD-10-CM | POA: Diagnosis not present

## 2014-08-02 DIAGNOSIS — M542 Cervicalgia: Secondary | ICD-10-CM | POA: Diagnosis not present

## 2014-08-02 DIAGNOSIS — I10 Essential (primary) hypertension: Secondary | ICD-10-CM | POA: Diagnosis not present

## 2014-08-12 ENCOUNTER — Telehealth: Payer: Self-pay | Admitting: *Deleted

## 2014-08-15 NOTE — Telephone Encounter (Signed)
Wife returned my call about pt symptoms. I informed her that pt did not have any episodes. Wife voiced understanding and stated that her husband (pt) actually felt better after he ate. I encouraged her to call back with further issues.

## 2014-09-06 ENCOUNTER — Ambulatory Visit (INDEPENDENT_AMBULATORY_CARE_PROVIDER_SITE_OTHER): Payer: Medicare Other | Admitting: Cardiovascular Disease

## 2014-09-06 ENCOUNTER — Encounter: Payer: Self-pay | Admitting: Cardiovascular Disease

## 2014-09-06 VITALS — BP 132/70 | HR 70 | Resp 16 | Ht 70.5 in | Wt 232.3 lb

## 2014-09-06 DIAGNOSIS — G4733 Obstructive sleep apnea (adult) (pediatric): Secondary | ICD-10-CM

## 2014-09-06 DIAGNOSIS — I429 Cardiomyopathy, unspecified: Secondary | ICD-10-CM

## 2014-09-06 DIAGNOSIS — I472 Ventricular tachycardia, unspecified: Secondary | ICD-10-CM

## 2014-09-06 DIAGNOSIS — Z95810 Presence of automatic (implantable) cardiac defibrillator: Secondary | ICD-10-CM

## 2014-09-06 DIAGNOSIS — I2510 Atherosclerotic heart disease of native coronary artery without angina pectoris: Secondary | ICD-10-CM

## 2014-09-06 DIAGNOSIS — E785 Hyperlipidemia, unspecified: Secondary | ICD-10-CM | POA: Diagnosis not present

## 2014-09-06 DIAGNOSIS — I428 Other cardiomyopathies: Secondary | ICD-10-CM

## 2014-09-06 NOTE — Patient Instructions (Addendum)
Remote monitoring is used to monitor your ICD from home. This monitoring reduces the number of office visits required to check your device to one time per year. It allows [place] to keep an eye on the functioning of your device to ensure it is working properly. You are scheduled for a device check from home on 12-08-2014. You may send your transmission at any time that day. If you have a wireless device, the transmission will be sent automatically. After your physician reviews your transmission, you will receive a postcard with your next transmission date.  Your physician recommends that you schedule a follow-up appointment in: 6 months with Dr.Croitoru  Your physician recommends that you return for lab work when fasting.

## 2014-09-07 ENCOUNTER — Encounter: Payer: Self-pay | Admitting: Cardiovascular Disease

## 2014-09-07 NOTE — Progress Notes (Signed)
Patient ID: William [name], male   DOB: [birth year], 80 y.o.   MRN: [id number]     Reason for office visit VT, nonischemic cardiomyopathy  William [name] fell and has a spinal fracture, which has slowed him down, but he denies cardiac complaints.   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 The arrhythmia responds very well to amiodarone therapy even in very low doses. Attempts at completely weaning off this medication have repeatedly been associated with recurrent arrhythmia. Has a relatively small infrarenal abdominal aortic aneurysm, 3.7 cm in diameter by the most recent evaluation.  He is 8 months status post elective replacement of his defibrillator generator and ventricular lead (his initial lead was under advisory). His current device is a Medtronic Evera dual-chamber ICD.     Allergies  Allergen Reactions  . Crestor [Rosuvastatin]     Hurts muscles  . Lipitor [Atorvastatin]     Hurts stomach    Current Outpatient Prescriptions  Medication Sig Dispense Refill  . acetaminophen (TYLENOL) 500 MG tablet Take 500 mg by mouth every 6 (six) hours as needed (for pain).     Marland Kitchen amiodarone (PACERONE) 200 MG tablet Take 100 mg by mouth daily.    . ciclopirox (PENLAC) 8 % solution As directed  2  . ezetimibe-simvastatin (VYTORIN) 10-20 MG per tablet Take 1 tablet by mouth at bedtime.    Marland Kitchen loratadine (CLARITIN) 10 MG tablet Take 10 mg by mouth daily as needed for allergies.     . metoprolol tartrate (LOPRESSOR) 25 MG tablet Take 2 tablets (50 mg total) by mouth 2 (two) times daily. 360 tablet 2  . MYRBETRIQ 25 MG TB24 tablet Take 1 tablet by mouth daily.  5  . polyethylene glycol (MIRALAX / GLYCOLAX) packet Take 17 g by mouth every evening.    . trimethoprim (TRIMPEX) 100 MG tablet Take 1 tablet (100 mg total) by mouth 1 day or 1 dose. 30 tablet 1   No current  facility-administered medications for this visit.    Past Medical History  Diagnosis Date  . Ventricular tachycardia   . Hemorrhage     Post-colonostomy  . Hernia   . CHF (congestive heart failure)   . Systolic dysfunction, left ventricle   . AAA (abdominal aortic aneurysm)     7/13 3.8cm  . Dyslipidemia   . Automatic implantable cardioverter-defibrillator in situ   . High cholesterol   . Asthma     "seasonal; some foods"   . Sleep apnea     "lost 60# & don't have it anymore" (01/11/2014)  . Arthritis     "joints" (01/11/2014)  . Prostate cancer   . S/P ICD (internal cardiac defibrillator) procedure, 01/11/14 removal of ERI gen and placement of Medtronic Evera XT VR & NEW Right ventricular lead Medtronic 01/12/2014  . Dysrhythmia   . Shortness of breath     with exerton   . Lumbar vertebral fracture     L1- 04/11/2014     Past Surgical History  Procedure Laterality Date  . Nm myocar perf wall motion  01/29/2012    abnormal c/o infarct/scar,no ischemia present  . Cardiac catheterization  08/20/2004    noncritical CAD,mild global hypokinesis, EF 50%  . Prostate biopsy N/A 11/22/2013    Procedure: PROSTATE BIOPSY AND ULTRASOUND;  Surgeon: Ailene Rud, MD;  Location: [facility];  Service: Urology;  Laterality: N/A;  . Icd generator change  01/11/2014    "  upgrade"  . Colectomy  1990's  . Hernia repair      "abdomen; from colon OR"  . Cataract extraction w/ intraocular lens implant Right   . Cardiac defibrillator placement  08/23/2004    Medtronic  . Cryoablation N/A 02/24/2014    Procedure: CRYO ABLATION PROSTATE;  Surgeon: Ailene Rud, MD;  Location: [facility];  Service: Urology;  Laterality: N/A;    Family History  Problem Relation Age of Onset  . Heart failure Mother   . Heart failure Father     History   Social History  . Marital Status: Married    Spouse Name: N/A    Number of Children: N/A  . Years of Education: N/A   Occupational History  . Not on  file.   Social History Main Topics  . Smoking status: Never Smoker   . Smokeless tobacco: Never Used  . Alcohol Use: 0.0 oz/week    0 Cans of beer per week     Comment: 01/11/2014 "drink 1/2 of a beer twice per month   . Drug Use: No  . Sexual Activity: No   Other Topics Concern  . Not on file   Social History Narrative    Review of systems: The patient specifically denies any chest pain at rest exertion, dyspnea at rest or with exertion, orthopnea, paroxysmal nocturnal dyspnea, syncope, palpitations, focal neurological deficits, intermittent claudication, lower extremity edema, unexplained weight gain, cough, hemoptysis or wheezing.   PHYSICAL EXAM BP 132/70 mmHg  Pulse 70  Resp 16  Ht 5' 10.5" (1.791 m)  Wt 232 lb 4.8 oz (105.371 kg)  BMI 32.85 kg/m2 General: Alert, oriented x3, no distress, obese  Head: no evidence of trauma, PERRL, EOMI, no exophtalmos or lid lag, no myxedema, no xanthelasma; normal ears, nose and oropharynx  Neck: normal jugular venous pulsations and no hepatojugular reflux; brisk carotid pulses without delay and no carotid bruits  Chest: clear to auscultation, no signs of consolidation by percussion or palpation, normal fremitus, symmetrical and full respiratory excursions, of the left subclavian ICD site  Cardiovascular: normal position and quality of the apical impulse, regular rhythm at baseline with very frequent ectopic beats in a pattern of trigeminy, normal first and second heart sounds, no murmurs, rubs or gallops  Abdomen: Protuberant, no tenderness or distention, no masses by palpation, no abnormal pulsatility or arterial bruits, normal bowel sounds, no hepatosplenomegaly  Extremities: no clubbing, cyanosis or edema; 2+ radial, ulnar and brachial pulses bilaterally; 2+ right femoral, posterior tibial and dorsalis pedis pulses; 2+ left femoral, posterior tibial and dorsalis pedis pulses; no subclavian or femoral bruits  Neurological: grossly  nonfocal   EKG: Sinus rhythm with trigeminal PVCs, first-degree AV block, left atrial abnormality, left axis deviation, right bundle branch block and left anterior fascicular block, QRS 130 ms, no acute repolarization abnormalities, QTc 470 ms  BMET    Component Value Date/Time   NA 138 02/15/2014 1135   K 4.8 02/15/2014 1135   CL 103 02/15/2014 1135   CO2 26 02/15/2014 1135   GLUCOSE 108* 02/15/2014 1135   BUN 19 02/15/2014 1135   CREATININE 1.24 02/15/2014 1135   CREATININE 0.99 01/05/2014 1358   CALCIUM 9.1 02/15/2014 1135   GFRNONAA 52* 02/15/2014 1135   GFRAA 60* 02/15/2014 1135     ASSESSMENT AND PLAN  Sustained VT (ventricular tachycardia) No episodes of VT have been detected since implantation of the new device. He will continue on low-dose amiodarone with periodic review of his  liver function tests, thyroid function tests and lung function. Attempts at discontinuation of amiodarone in the past led to recurrent episodes of VT.  S/P ICD (internal cardiac defibrillator) procedure, 01/11/14 removal of ERI gen and placement of Medtronic Evera XT VR & NEW Right ventricular lead Medtronic Normal device function. Schedule for a remote downloaded in 3 months and an office evaluation in 6 months.  Nonischemic cardiomyopathy Left ventricular ejection fraction has been between 45 and 50% depending on the study used for evaluation. It has been stable at this level for the last 10 years. He has excellent functional status (NYHA class I) and is clinically euvolemic.   AAA (abdominal aortic aneurysm) Duplex ultrasound in September showed no change in aneurysm size since last evaluation, maximum diameter 3.7 cm; repeat in one year  Orders Placed This Encounter  Procedures  . Comp Met (CMET)  . TSH  . Lipid Profile   Meds ordered this encounter  Medications  . MYRBETRIQ 25 MG TB24 tablet    Sig: Take 1 tablet by mouth daily.    Refill:  5  . ciclopirox (PENLAC) 8 % solution     Sig: As directed    Refill:  2    Yennifer Segovia  Sanda Klein, MD, [facility] [phone] office [phone] pager

## 2014-09-12 LAB — MDC_IDC_ENUM_SESS_TYPE_INCLINIC
Battery Remaining Longevity: 11.2
HIGH POWER IMPEDANCE MEASURED VALUE: 64 Ohm
Lead Channel Pacing Threshold Amplitude: 0.5 V
Lead Channel Setting Sensing Sensitivity: 0.3 mV
MDC IDC MSMT LEADCHNL RV IMPEDANCE VALUE: 475 Ohm
MDC IDC MSMT LEADCHNL RV PACING THRESHOLD PULSEWIDTH: 0.4 ms
MDC IDC MSMT LEADCHNL RV SENSING INTR AMPL: 4.3 mV
MDC IDC SET LEADCHNL RV PACING AMPLITUDE: 2.5 V
MDC IDC SET LEADCHNL RV PACING PULSEWIDTH: 0.4 ms
Zone Setting Detection Interval: 240 ms
Zone Setting Detection Interval: 300 ms
Zone Setting Detection Interval: 400 ms
Zone Setting Detection Interval: 450 ms

## 2014-09-13 DIAGNOSIS — R278 Other lack of coordination: Secondary | ICD-10-CM | POA: Diagnosis not present

## 2014-09-13 DIAGNOSIS — N3942 Incontinence without sensory awareness: Secondary | ICD-10-CM | POA: Diagnosis not present

## 2014-09-13 DIAGNOSIS — C61 Malignant neoplasm of prostate: Secondary | ICD-10-CM | POA: Diagnosis not present

## 2014-09-13 DIAGNOSIS — M6281 Muscle weakness (generalized): Secondary | ICD-10-CM | POA: Diagnosis not present

## 2014-09-16 ENCOUNTER — Telehealth: Payer: Self-pay | Admitting: *Deleted

## 2014-09-16 NOTE — Telephone Encounter (Signed)
LMTCB//sss.   Pt needs to have labs done. Per Dr.Tisovec's ofc last labs were drawn on 6-16 and it was only a CMP. LM w/ Tisovec medical records asking that last result be faxed to [facility].

## 2014-09-19 NOTE — Telephone Encounter (Signed)
Informed wife that pt does need to have his blood work done this month. Wife voiced understanding and stated that she would have it done at [facility] and make sure that copy is faxed to [facility].

## 2014-09-19 NOTE — Telephone Encounter (Signed)
William [name] is returning your call from Friday. Please call back  Thanks

## 2014-09-19 NOTE — Telephone Encounter (Signed)
Follow Up       Returning phone call. Please call back and advise.

## 2014-09-20 DIAGNOSIS — R351 Nocturia: Secondary | ICD-10-CM | POA: Diagnosis not present

## 2014-09-20 DIAGNOSIS — M6281 Muscle weakness (generalized): Secondary | ICD-10-CM | POA: Diagnosis not present

## 2014-09-20 DIAGNOSIS — E785 Hyperlipidemia, unspecified: Secondary | ICD-10-CM | POA: Diagnosis not present

## 2014-09-20 DIAGNOSIS — R7301 Impaired fasting glucose: Secondary | ICD-10-CM | POA: Diagnosis not present

## 2014-09-20 DIAGNOSIS — Z0000 Encounter for general adult medical examination without abnormal findings: Secondary | ICD-10-CM | POA: Diagnosis not present

## 2014-09-20 DIAGNOSIS — Z125 Encounter for screening for malignant neoplasm of prostate: Secondary | ICD-10-CM | POA: Diagnosis not present

## 2014-09-20 DIAGNOSIS — R278 Other lack of coordination: Secondary | ICD-10-CM | POA: Diagnosis not present

## 2014-09-20 DIAGNOSIS — N3941 Urge incontinence: Secondary | ICD-10-CM | POA: Diagnosis not present

## 2014-09-20 DIAGNOSIS — I10 Essential (primary) hypertension: Secondary | ICD-10-CM | POA: Diagnosis not present

## 2014-09-22 ENCOUNTER — Encounter (HOSPITAL_COMMUNITY): Payer: Self-pay | Admitting: Cardiovascular Disease

## 2014-09-27 DIAGNOSIS — Z0000 Encounter for general adult medical examination without abnormal findings: Secondary | ICD-10-CM | POA: Diagnosis not present

## 2014-09-27 DIAGNOSIS — E669 Obesity, unspecified: Secondary | ICD-10-CM | POA: Diagnosis not present

## 2014-09-27 DIAGNOSIS — N3942 Incontinence without sensory awareness: Secondary | ICD-10-CM | POA: Diagnosis not present

## 2014-09-27 DIAGNOSIS — N3941 Urge incontinence: Secondary | ICD-10-CM | POA: Diagnosis not present

## 2014-09-27 DIAGNOSIS — M6281 Muscle weakness (generalized): Secondary | ICD-10-CM | POA: Diagnosis not present

## 2014-09-27 DIAGNOSIS — R278 Other lack of coordination: Secondary | ICD-10-CM | POA: Diagnosis not present

## 2014-09-27 DIAGNOSIS — G4733 Obstructive sleep apnea (adult) (pediatric): Secondary | ICD-10-CM | POA: Diagnosis not present

## 2014-09-27 DIAGNOSIS — I480 Paroxysmal atrial fibrillation: Secondary | ICD-10-CM | POA: Diagnosis not present

## 2014-09-27 DIAGNOSIS — E785 Hyperlipidemia, unspecified: Secondary | ICD-10-CM | POA: Diagnosis not present

## 2014-09-27 DIAGNOSIS — C61 Malignant neoplasm of prostate: Secondary | ICD-10-CM | POA: Diagnosis not present

## 2014-09-27 DIAGNOSIS — R7301 Impaired fasting glucose: Secondary | ICD-10-CM | POA: Diagnosis not present

## 2014-09-27 DIAGNOSIS — R351 Nocturia: Secondary | ICD-10-CM | POA: Diagnosis not present

## 2014-09-27 DIAGNOSIS — I10 Essential (primary) hypertension: Secondary | ICD-10-CM | POA: Diagnosis not present

## 2014-10-03 DIAGNOSIS — R351 Nocturia: Secondary | ICD-10-CM | POA: Diagnosis not present

## 2014-10-03 DIAGNOSIS — N3941 Urge incontinence: Secondary | ICD-10-CM | POA: Diagnosis not present

## 2014-10-03 DIAGNOSIS — M6281 Muscle weakness (generalized): Secondary | ICD-10-CM | POA: Diagnosis not present

## 2014-10-03 DIAGNOSIS — N3942 Incontinence without sensory awareness: Secondary | ICD-10-CM | POA: Diagnosis not present

## 2014-10-03 DIAGNOSIS — R278 Other lack of coordination: Secondary | ICD-10-CM | POA: Diagnosis not present

## 2014-10-08 ENCOUNTER — Other Ambulatory Visit: Payer: Self-pay | Admitting: Cardiovascular Disease

## 2014-10-10 NOTE — Telephone Encounter (Signed)
Rx(s) sent to pharmacy electronically.  

## 2014-10-18 DIAGNOSIS — M6281 Muscle weakness (generalized): Secondary | ICD-10-CM | POA: Diagnosis not present

## 2014-10-18 DIAGNOSIS — R278 Other lack of coordination: Secondary | ICD-10-CM | POA: Diagnosis not present

## 2014-10-18 DIAGNOSIS — R351 Nocturia: Secondary | ICD-10-CM | POA: Diagnosis not present

## 2014-10-18 DIAGNOSIS — N3941 Urge incontinence: Secondary | ICD-10-CM | POA: Diagnosis not present

## 2014-10-18 DIAGNOSIS — N3942 Incontinence without sensory awareness: Secondary | ICD-10-CM | POA: Diagnosis not present

## 2014-10-25 DIAGNOSIS — H02102 Unspecified ectropion of right lower eyelid: Secondary | ICD-10-CM | POA: Diagnosis not present

## 2014-10-25 DIAGNOSIS — H52203 Unspecified astigmatism, bilateral: Secondary | ICD-10-CM | POA: Diagnosis not present

## 2014-10-25 DIAGNOSIS — H02105 Unspecified ectropion of left lower eyelid: Secondary | ICD-10-CM | POA: Diagnosis not present

## 2014-10-25 DIAGNOSIS — H2512 Age-related nuclear cataract, left eye: Secondary | ICD-10-CM | POA: Diagnosis not present

## 2014-10-26 DIAGNOSIS — R278 Other lack of coordination: Secondary | ICD-10-CM | POA: Diagnosis not present

## 2014-10-26 DIAGNOSIS — R351 Nocturia: Secondary | ICD-10-CM | POA: Diagnosis not present

## 2014-10-26 DIAGNOSIS — N3941 Urge incontinence: Secondary | ICD-10-CM | POA: Diagnosis not present

## 2014-10-26 DIAGNOSIS — N3942 Incontinence without sensory awareness: Secondary | ICD-10-CM | POA: Diagnosis not present

## 2014-10-26 DIAGNOSIS — M6281 Muscle weakness (generalized): Secondary | ICD-10-CM | POA: Diagnosis not present

## 2014-11-09 DIAGNOSIS — R351 Nocturia: Secondary | ICD-10-CM | POA: Diagnosis not present

## 2014-11-09 DIAGNOSIS — N3941 Urge incontinence: Secondary | ICD-10-CM | POA: Diagnosis not present

## 2014-11-09 DIAGNOSIS — N3942 Incontinence without sensory awareness: Secondary | ICD-10-CM | POA: Diagnosis not present

## 2014-11-09 DIAGNOSIS — R278 Other lack of coordination: Secondary | ICD-10-CM | POA: Diagnosis not present

## 2014-11-09 DIAGNOSIS — M6281 Muscle weakness (generalized): Secondary | ICD-10-CM | POA: Diagnosis not present

## 2014-11-23 DIAGNOSIS — M6281 Muscle weakness (generalized): Secondary | ICD-10-CM | POA: Diagnosis not present

## 2014-11-23 DIAGNOSIS — N3941 Urge incontinence: Secondary | ICD-10-CM | POA: Diagnosis not present

## 2014-11-23 DIAGNOSIS — R278 Other lack of coordination: Secondary | ICD-10-CM | POA: Diagnosis not present

## 2014-11-23 DIAGNOSIS — R351 Nocturia: Secondary | ICD-10-CM | POA: Diagnosis not present

## 2014-11-23 DIAGNOSIS — N3942 Incontinence without sensory awareness: Secondary | ICD-10-CM | POA: Diagnosis not present

## 2014-12-08 ENCOUNTER — Telehealth: Payer: Self-pay | Admitting: Cardiovascular Disease

## 2014-12-08 ENCOUNTER — Ambulatory Visit (INDEPENDENT_AMBULATORY_CARE_PROVIDER_SITE_OTHER): Payer: Medicare Other | Admitting: *Deleted

## 2014-12-08 DIAGNOSIS — I428 Other cardiomyopathies: Secondary | ICD-10-CM

## 2014-12-08 DIAGNOSIS — M6281 Muscle weakness (generalized): Secondary | ICD-10-CM | POA: Diagnosis not present

## 2014-12-08 DIAGNOSIS — N3942 Incontinence without sensory awareness: Secondary | ICD-10-CM | POA: Diagnosis not present

## 2014-12-08 DIAGNOSIS — I429 Cardiomyopathy, unspecified: Secondary | ICD-10-CM

## 2014-12-08 DIAGNOSIS — R351 Nocturia: Secondary | ICD-10-CM | POA: Diagnosis not present

## 2014-12-08 DIAGNOSIS — R278 Other lack of coordination: Secondary | ICD-10-CM | POA: Diagnosis not present

## 2014-12-08 LAB — MDC_IDC_ENUM_SESS_TYPE_REMOTE
Battery Remaining Longevity: 134 mo
Brady Statistic RV Percent Paced: 0.03 %
Date Time Interrogation Session: 20160225072823
HIGH POWER IMPEDANCE MEASURED VALUE: 73 Ohm
Lead Channel Impedance Value: 551 Ohm
Lead Channel Pacing Threshold Amplitude: 0.625 V
Lead Channel Pacing Threshold Pulse Width: 0.4 ms
Lead Channel Setting Sensing Sensitivity: 0.3 mV
MDC IDC MSMT BATTERY VOLTAGE: 3.05 V
MDC IDC MSMT LEADCHNL RV IMPEDANCE VALUE: 399 Ohm
MDC IDC MSMT LEADCHNL RV SENSING INTR AMPL: 4.375 mV
MDC IDC MSMT LEADCHNL RV SENSING INTR AMPL: 4.375 mV
MDC IDC SET LEADCHNL RV PACING AMPLITUDE: 2.5 V
MDC IDC SET LEADCHNL RV PACING PULSEWIDTH: 0.4 ms
MDC IDC SET ZONE DETECTION INTERVAL: 240 ms
Zone Setting Detection Interval: 300 ms
Zone Setting Detection Interval: 400 ms
Zone Setting Detection Interval: 450 ms

## 2014-12-08 NOTE — Telephone Encounter (Signed)
Informed EC that the remote was received at 2:28am. EC voiced understanding.

## 2014-12-08 NOTE — Progress Notes (Signed)
Remote ICD transmission.   

## 2014-12-08 NOTE — Telephone Encounter (Signed)
Please call asap,question about his his transmitting. He is trying to do it now.

## 2014-12-16 ENCOUNTER — Encounter: Payer: Self-pay | Admitting: Cardiology

## 2014-12-21 ENCOUNTER — Encounter: Payer: Self-pay | Admitting: Cardiovascular Disease

## 2014-12-29 DIAGNOSIS — K868 Other specified diseases of pancreas: Secondary | ICD-10-CM | POA: Diagnosis not present

## 2015-01-02 ENCOUNTER — Other Ambulatory Visit: Payer: Self-pay | Admitting: Internal Medicine

## 2015-01-02 DIAGNOSIS — K8689 Other specified diseases of pancreas: Secondary | ICD-10-CM

## 2015-01-06 ENCOUNTER — Other Ambulatory Visit: Payer: BLUE CROSS/BLUE SHIELD

## 2015-01-12 ENCOUNTER — Inpatient Hospital Stay: Admission: RE | Admit: 2015-01-12 | Payer: BLUE CROSS/BLUE SHIELD | Source: Ambulatory Visit

## 2015-01-12 DIAGNOSIS — K868 Other specified diseases of pancreas: Secondary | ICD-10-CM | POA: Diagnosis not present

## 2015-01-12 DIAGNOSIS — C61 Malignant neoplasm of prostate: Secondary | ICD-10-CM | POA: Diagnosis not present

## 2015-01-18 ENCOUNTER — Other Ambulatory Visit: Payer: BLUE CROSS/BLUE SHIELD

## 2015-01-23 ENCOUNTER — Ambulatory Visit
Admission: RE | Admit: 2015-01-23 | Discharge: 2015-01-23 | Disposition: A | Discharge status: Home / Self Care | Payer: Medicare Other | Source: Ambulatory Visit | Attending: Internal Medicine | Admitting: Internal Medicine

## 2015-01-23 DIAGNOSIS — I2510 Atherosclerotic heart disease of native coronary artery without angina pectoris: Secondary | ICD-10-CM | POA: Diagnosis not present

## 2015-01-23 DIAGNOSIS — K8689 Other specified diseases of pancreas: Secondary | ICD-10-CM

## 2015-01-23 DIAGNOSIS — I714 Abdominal aortic aneurysm, without rupture: Secondary | ICD-10-CM | POA: Diagnosis not present

## 2015-01-23 DIAGNOSIS — K8020 Calculus of gallbladder without cholecystitis without obstruction: Secondary | ICD-10-CM | POA: Diagnosis not present

## 2015-01-23 DIAGNOSIS — K862 Cyst of pancreas: Secondary | ICD-10-CM | POA: Diagnosis not present

## 2015-01-23 MED ORDER — IOPAMIDOL (ISOVUE-370) INJECTION 76%
125.0000 mL | Freq: Once | INTRAVENOUS | Status: AC | PRN
  Administered 2015-01-23: 125 mL via INTRAVENOUS

## 2015-02-07 DIAGNOSIS — C61 Malignant neoplasm of prostate: Secondary | ICD-10-CM | POA: Diagnosis not present

## 2015-02-14 ENCOUNTER — Other Ambulatory Visit: Payer: Self-pay | Admitting: Cardiovascular Disease

## 2015-03-07 ENCOUNTER — Encounter: Payer: Self-pay | Admitting: Cardiovascular Disease

## 2015-03-07 ENCOUNTER — Ambulatory Visit (INDEPENDENT_AMBULATORY_CARE_PROVIDER_SITE_OTHER): Payer: Medicare Other | Admitting: Cardiovascular Disease

## 2015-03-07 VITALS — BP 113/72 | HR 66 | Ht 70.0 in | Wt 235.2 lb

## 2015-03-07 DIAGNOSIS — I472 Ventricular tachycardia, unspecified: Secondary | ICD-10-CM

## 2015-03-07 DIAGNOSIS — I429 Cardiomyopathy, unspecified: Secondary | ICD-10-CM | POA: Diagnosis not present

## 2015-03-07 DIAGNOSIS — Z95810 Presence of automatic (implantable) cardiac defibrillator: Secondary | ICD-10-CM

## 2015-03-07 DIAGNOSIS — Z79899 Other long term (current) drug therapy: Secondary | ICD-10-CM | POA: Diagnosis not present

## 2015-03-07 DIAGNOSIS — I428 Other cardiomyopathies: Secondary | ICD-10-CM

## 2015-03-07 DIAGNOSIS — I2510 Atherosclerotic heart disease of native coronary artery without angina pectoris: Secondary | ICD-10-CM

## 2015-03-07 LAB — COMPREHENSIVE METABOLIC PANEL
ALK PHOS: 79 U/L (ref 39–117)
ALT: 15 U/L (ref 0–53)
AST: 19 U/L (ref 0–37)
Albumin: 4 g/dL (ref 3.5–5.2)
BILIRUBIN TOTAL: 1.6 mg/dL — AB (ref 0.2–1.2)
BUN: 27 mg/dL — ABNORMAL HIGH (ref 6–23)
CO2: 23 mEq/L (ref 19–32)
CREATININE: 1.28 mg/dL (ref 0.50–1.35)
Calcium: 8.9 mg/dL (ref 8.4–10.5)
Chloride: 103 mEq/L (ref 96–112)
Glucose, Bld: 91 mg/dL (ref 70–99)
Potassium: 4.6 mEq/L (ref 3.5–5.3)
Sodium: 138 mEq/L (ref 135–145)
Total Protein: 6.8 g/dL (ref 6.0–8.3)

## 2015-03-07 LAB — CUP PACEART INCLINIC DEVICE CHECK
Date Time Interrogation Session: 20160524150024
HighPow Impedance: 74 Ohm
Lead Channel Pacing Threshold Pulse Width: 0.4 ms
Lead Channel Sensing Intrinsic Amplitude: 4.75 mV
Lead Channel Sensing Intrinsic Amplitude: 4.75 mV
Lead Channel Setting Pacing Amplitude: 2.5 V
Lead Channel Setting Pacing Pulse Width: 0.4 ms
MDC IDC MSMT BATTERY REMAINING LONGEVITY: 132 mo
MDC IDC MSMT BATTERY VOLTAGE: 3.05 V
MDC IDC MSMT LEADCHNL RV IMPEDANCE VALUE: 418 Ohm
MDC IDC MSMT LEADCHNL RV IMPEDANCE VALUE: 475 Ohm
MDC IDC MSMT LEADCHNL RV PACING THRESHOLD AMPLITUDE: 0.625 V
MDC IDC SET LEADCHNL RV SENSING SENSITIVITY: 0.3 mV
MDC IDC SET ZONE DETECTION INTERVAL: 400 ms
MDC IDC STAT BRADY RV PERCENT PACED: 0.05 %
Zone Setting Detection Interval: 240 ms
Zone Setting Detection Interval: 300 ms
Zone Setting Detection Interval: 450 ms

## 2015-03-07 LAB — TSH: TSH: 1.988 u[IU]/mL (ref 0.350–4.500)

## 2015-03-07 NOTE — Patient Instructions (Signed)
Your physician recommends that you return for lab work in: [place] [facility].  Remote monitoring is used to monitor your Pacemaker or ICD from home. This monitoring reduces the number of office visits required to check your device to one time per year. It allows [place] to monitor the functioning of your device to ensure it is working properly. You are scheduled for a device check from home on June 08, 2015. You may send your transmission at any time that day. If you have a wireless device, the transmission will be sent automatically. After your physician reviews your transmission, you will receive a postcard with your next transmission date.  Dr. Sallyanne Kuster recommends that you schedule a follow-up appointment in: 6 MONTHS.     Marland Kitchen

## 2015-03-07 NOTE — Progress Notes (Signed)
Patient ID: William [name], male   DOB: [birth year], 81 y.o.   MRN: [id number]     Cardiology Office Note   Date:  03/07/2015   ID:  William [name], DOB [birth year], MRN [id number]  PCP:  Haywood Pao, MD  Cardiologist:   Sanda Klein, MD   Chief Complaint  Patient presents with  . Follow-up    No complaints of chest pain, SOB, edema or dizziness.      History of Present Illness: William [name] is a 81 y.o. male who presents for VT, nonischemic cardiomyopathy  William [name] is recovered well from his fall and spinal fracture. William [name] denies cardiac complaints.   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 The arrhythmia responds very well to amiodarone therapy even in very low doses. Attempts at completely weaning off this medication have repeatedly been associated with recurrent arrhythmia. William [name] is currently on 100 mg amiodarone daily   Has a relatively small infrarenal abdominal aortic aneurysm, 3.7 cm in diameter by the most recent evaluation.  William [name] is roughly one year status post elective replacement of his defibrillator generator and ventricular lead (his initial lead was [employer] fidelis under advisory). His current device is a single chamber Medtronic Evera dual-chamber ICD.  Interrogation of his defibrillator shows normal device function with battery life. Expected longevity 11 years. William [name] does not require ventricular pacing. Thoracic impedance is in normal range.  William [name] had one episode of true sustained ventricular tachycardia on February 26th. It was promptly terminated with a single antitachycardia pacing burst by his device. It occurred around midnight and William [name] was completely unaware of it.  His wife complains that William [name] is constantly sleepy. Patient thinks that William [name] is fine. William [name] does have a history of obstructive sleep apnea with both the patient and his wife  believe that this was "cured" when William [name] lost 30 pounds of weight. His weight 260 pounds. William [name] no longer snores and she no longer notices the apneic events that she saw in the past. His tyroid function tests have not been checked recently.   Past Medical History  Diagnosis Date  . Ventricular tachycardia   . Hemorrhage     Post-colonostomy  . Hernia   . CHF (congestive heart failure)   . Systolic dysfunction, left ventricle   . AAA (abdominal aortic aneurysm)     7/13 3.8cm  . Dyslipidemia   . Automatic implantable cardioverter-defibrillator in situ   . High cholesterol   . Asthma     "seasonal; some foods"   . Sleep apnea     "lost 60# & don't have it anymore" (01/11/2014)  . Arthritis     "joints" (01/11/2014)  . Prostate cancer   . S/P ICD (internal cardiac defibrillator) procedure, 01/11/14 removal of ERI gen and placement of Medtronic Evera XT VR & NEW Right ventricular lead Medtronic 01/12/2014  . Dysrhythmia   . Shortness of breath     with exerton   . Lumbar vertebral fracture     L1- 04/11/2014     Past Surgical History  Procedure Laterality Date  . Nm myocar perf wall motion  01/29/2012    abnormal c/o infarct/scar,no ischemia present  . Cardiac catheterization  08/20/2004    noncritical CAD,mild global hypokinesis, EF 50%  . Prostate biopsy N/A 11/22/2013    Procedure: PROSTATE BIOPSY AND ULTRASOUND;  Surgeon: Ailene Rud, MD;  Location: [facility];  Service: Urology;  Laterality:  N/A;  . Icd generator change  01/11/2014    "upgrade"  . Colectomy  1990's  . Hernia repair      "abdomen; from colon OR"  . Cataract extraction w/ intraocular lens implant Right   . Cardiac defibrillator placement  08/23/2004    Medtronic  . Cryoablation N/A 02/24/2014    Procedure: CRYO ABLATION PROSTATE;  Surgeon: Ailene Rud, MD;  Location: [facility];  Service: Urology;  Laterality: N/A;  . Implantable cardioverter defibrillator (icd) generator change N/A 01/11/2014    Procedure:  ICD GENERATOR CHANGE;  Surgeon: Sanda Klein, MD;  Location: [facility];  Service: Cardiovascular;  Laterality: N/A;  . Lead revision N/A 01/11/2014    Procedure: LEAD REVISION;  Surgeon: Sanda Klein, MD;  Location: [facility];  Service: Cardiovascular;  Laterality: N/A;     Current Outpatient Prescriptions  Medication Sig Dispense Refill  . acetaminophen (TYLENOL) 500 MG tablet Take 500 mg by mouth every 6 (six) hours as needed (for pain).     Marland Kitchen amiodarone (PACERONE) 200 MG tablet Take 0.5 tablets (100 mg total) by mouth daily. 45 tablet 3  . ezetimibe-simvastatin (VYTORIN) 10-20 MG per tablet Take 1 tablet by mouth at bedtime.    Marland Kitchen loratadine (CLARITIN) 10 MG tablet Take 10 mg by mouth daily as needed for allergies.     . metoprolol tartrate (LOPRESSOR) 25 MG tablet TAKE 2 TABLETS 2 TIMES A DAY. 360 tablet 1  . polyethylene glycol (MIRALAX / GLYCOLAX) packet Take 17 g by mouth every evening.     No current facility-administered medications for this visit.    Allergies:   Crestor and Lipitor    Social History:  The patient  reports that William [name] has never smoked. William [name] has never used smokeless tobacco. William [name] reports that William [name] drinks alcohol. William [name] reports that William [name] does not use illicit drugs.   Family History:  The patient's family history includes Heart failure in his father and mother.    ROS:  Please see the history of present illness.    Otherwise, review of systems positive for none.   All other systems are reviewed and negative.    PHYSICAL EXAM: VS:  BP 113/72 mmHg  Pulse 66  Ht 5\' 10"  (1.778 m)  Wt 106.686 kg (235 lb 3.2 oz)  BMI 33.75 kg/m2 , BMI Body mass index is 33.75 kg/(m^2).  General: Alert, oriented x3, no distress Head: no evidence of trauma, PERRL, EOMI, no exophtalmos or lid lag, no myxedema, no xanthelasma; normal ears, nose and oropharynx Neck: normal jugular venous pulsations and no hepatojugular reflux; brisk carotid pulses without delay and no carotid  bruits Chest: clear to auscultation, no signs of consolidation by percussion or palpation, normal fremitus, symmetrical and full respiratory excursions Cardiovascular: normal position and quality of the apical impulse, regular rhythm, normal first and second heart sounds, no murmurs, rubs or gallops Abdomen: no tenderness or distention, no masses by palpation, no abnormal pulsatility or arterial bruits, normal bowel sounds, no hepatosplenomegaly Extremities: no clubbing, cyanosis or edema; 2+ radial, ulnar and brachial pulses bilaterally; 2+ right femoral, posterior tibial and dorsalis pedis pulses; 2+ left femoral, posterior tibial and dorsalis pedis pulses; no subclavian or femoral bruits Neurological: grossly nonfocal Psych: euthymic mood, full affect   EKG:  EKG is ordered today. The ekg ordered today demonstrates sinus rhythm with first-degree AV block and frequent PVCs in a pattern of trigeminy. The QRS shows a nonspecific intraventricular conduction delay with left axis deviation. QRS  duration is 132 ms. QTC is 484 ms.   Recent Labs: No results found for requested labs within last 365 days.    Lipid Panel No results found for: CHOL, TRIG, HDL, CHOLHDL, VLDL, LDLCALC, LDLDIRECT    Wt Readings from Last 3 Encounters:  03/07/15 106.686 kg (235 lb 3.2 oz)  09/06/14 105.371 kg (232 lb 4.8 oz)  02/21/14 104.146 kg (229 lb 9.6 oz)     ASSESSMENT AND PLAN:  Sustained VT (ventricular tachycardia) William [name] has had only one episode of sustained VT since implantation of his new generator. Attempts at discontinuation of amiodarone in the past led to recurrent episodes of VT. William [name] does not drive  S/P ICD (internal cardiac defibrillator) procedure, 01/11/14 removal of ERI gen and placement of Medtronic Evera XT VR & NEW Right ventricular lead Medtronic Normal device function. Schedule for a remote download in 3 months and an office evaluation in 6 months.  Nonischemic cardiomyopathy Left  ventricular ejection fraction has been between 45 and 50% depending on the study used for evaluation. It has been stable at this level for the last 10 years. William [name] has excellent functional status (NYHA class I) and is clinically euvolemic. William [name] has frequent PVCs that have a generally monomorphic pattern. William [name] has never had manifestations of congestive heart failure and has never taken diarrhetic's or RAAS inhibitors.   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 Check thyroid function tests  History of obstructive sleep apnea Not withstanding the patient reports and his wife's report of improvement in his snoring and lack of witnessed apnea events, I suspect the most likely reason for daytime fatigue. William [name] does not appear to express any interest in using CPAP. If his thyroid function tests him back normal, I think we should reevaluate him for sleep apnea and treatment.    Current medicines are reviewed at length with the patient today.  The patient does not have concerns regarding medicines.  The following changes have been made:  no change  Labs/ tests ordered today include:  Orders Placed This Encounter  Procedures  . Comprehensive metabolic panel  . TSH  . Implantable device check  . EKG 12-Lead    Patient Instructions  Your physician recommends that you return for lab work in: [place] [facility].  Remote monitoring is used to monitor your Pacemaker or ICD from home. This monitoring reduces the number of office visits required to check your device to one time per year. It allows [place] to monitor the functioning of your device to ensure it is working properly. You are scheduled for a device check from home on June 08, 2015. You may send your transmission at any time that  day. If you have a wireless device, the transmission will be sent automatically. After your physician reviews your transmission, you will receive a postcard with your next transmission date.  Dr. Sallyanne Kuster recommends that you schedule a follow-up appointment in: 6 MONTHS.     Mikael Spray, MD  03/07/2015 6:44 PM    Sanda Klein, MD, [facility] [phone] office [phone] pager

## 2015-03-28 ENCOUNTER — Encounter: Payer: Self-pay | Admitting: Cardiovascular Disease

## 2015-03-29 DIAGNOSIS — I472 Ventricular tachycardia: Secondary | ICD-10-CM | POA: Diagnosis not present

## 2015-03-29 DIAGNOSIS — Z1389 Encounter for screening for other disorder: Secondary | ICD-10-CM | POA: Diagnosis not present

## 2015-03-29 DIAGNOSIS — R7301 Impaired fasting glucose: Secondary | ICD-10-CM | POA: Diagnosis not present

## 2015-03-29 DIAGNOSIS — I429 Cardiomyopathy, unspecified: Secondary | ICD-10-CM | POA: Diagnosis not present

## 2015-03-29 DIAGNOSIS — D692 Other nonthrombocytopenic purpura: Secondary | ICD-10-CM | POA: Diagnosis not present

## 2015-03-29 DIAGNOSIS — Z6834 Body mass index (BMI) 34.0-34.9, adult: Secondary | ICD-10-CM | POA: Diagnosis not present

## 2015-03-29 DIAGNOSIS — H8110 Benign paroxysmal vertigo, unspecified ear: Secondary | ICD-10-CM | POA: Diagnosis not present

## 2015-03-29 DIAGNOSIS — K868 Other specified diseases of pancreas: Secondary | ICD-10-CM | POA: Diagnosis not present

## 2015-03-29 DIAGNOSIS — I714 Abdominal aortic aneurysm, without rupture: Secondary | ICD-10-CM | POA: Diagnosis not present

## 2015-03-29 DIAGNOSIS — R32 Unspecified urinary incontinence: Secondary | ICD-10-CM | POA: Diagnosis not present

## 2015-03-29 DIAGNOSIS — C61 Malignant neoplasm of prostate: Secondary | ICD-10-CM | POA: Diagnosis not present

## 2015-03-29 DIAGNOSIS — Z95810 Presence of automatic (implantable) cardiac defibrillator: Secondary | ICD-10-CM | POA: Diagnosis not present

## 2015-06-06 ENCOUNTER — Ambulatory Visit (INDEPENDENT_AMBULATORY_CARE_PROVIDER_SITE_OTHER): Payer: Medicare Other | Admitting: *Deleted

## 2015-06-06 DIAGNOSIS — I429 Cardiomyopathy, unspecified: Secondary | ICD-10-CM

## 2015-06-06 DIAGNOSIS — I428 Other cardiomyopathies: Secondary | ICD-10-CM

## 2015-06-06 NOTE — Progress Notes (Signed)
Remote ICD transmission.   

## 2015-06-13 LAB — CUP PACEART REMOTE DEVICE CHECK
Battery Remaining Longevity: 130 mo
Brady Statistic RV Percent Paced: 0.03 %
HighPow Impedance: 73 Ohm
Lead Channel Impedance Value: 475 Ohm
Lead Channel Pacing Threshold Amplitude: 0.75 V
Lead Channel Pacing Threshold Pulse Width: 0.4 ms
Lead Channel Sensing Intrinsic Amplitude: 4.625 mV
Lead Channel Sensing Intrinsic Amplitude: 4.625 mV
Lead Channel Setting Pacing Pulse Width: 0.4 ms
Lead Channel Setting Sensing Sensitivity: 0.3 mV
MDC IDC MSMT BATTERY VOLTAGE: 3.04 V
MDC IDC MSMT LEADCHNL RV IMPEDANCE VALUE: 399 Ohm
MDC IDC SESS DTM: 20160823041603
MDC IDC SET LEADCHNL RV PACING AMPLITUDE: 2.5 V
MDC IDC SET ZONE DETECTION INTERVAL: 450 ms
Zone Setting Detection Interval: 240 ms
Zone Setting Detection Interval: 300 ms
Zone Setting Detection Interval: 400 ms

## 2015-06-28 ENCOUNTER — Other Ambulatory Visit: Payer: Self-pay | Admitting: Cardiovascular Disease

## 2015-06-28 DIAGNOSIS — I714 Abdominal aortic aneurysm, without rupture, unspecified: Secondary | ICD-10-CM

## 2015-06-30 ENCOUNTER — Encounter: Payer: Self-pay | Admitting: Cardiology

## 2015-07-04 ENCOUNTER — Inpatient Hospital Stay (HOSPITAL_COMMUNITY): Admission: RE | Admit: 2015-07-04 | Payer: Medicare Other | Source: Ambulatory Visit

## 2015-07-07 ENCOUNTER — Encounter: Payer: Self-pay | Admitting: Cardiovascular Disease

## 2015-08-18 ENCOUNTER — Encounter: Payer: Self-pay | Admitting: Cardiovascular Disease

## 2015-08-28 NOTE — Progress Notes (Signed)
Patient ID: William [name], male   DOB: [birth year], 81 y.o.   MRN: [id number]     Cardiology Office Note   Date:  08/29/2015   ID:  William [name], DOB [id number], MRN [id number]  PCP:  William Pao, MD  Cardiologist:   William Klein, MD   No chief complaint on file.     History of Present Illness: William [name] is a 81 y.o. male who presents for VT, nonischemic cardiomyopathy  He feels great, remains physically active and has no cardiovascular complaints. He continues to have very frequent PVCs but since his last device check only one episode of very brief (8 beats) nonsustained ventricular tachycardia has been recorded. He has not had sustained ventricular tachycardia.  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 The arrhythmia responds very well to amiodarone therapy even in very low doses. Attempts at completely weaning off this medication have repeatedly been associated with recurrent arrhythmia. He is currently on 100 mg amiodarone daily   Has a relatively small infrarenal abdominal aortic aneurysm, 3.7 cm in diameter by the most recent evaluation.  He is roughly 2 years status post elective replacement of his defibrillator generator and ventricular lead (his initial lead was Sprint fidelis under advisory). His current device is a single chamber Medtronic Evera dual-chamber ICD.  Interrogation of his defibrillator shows normal device function. Expected longevity 10.7 years. He does not require ventricular pacing. Thoracic impedance is in normal range.      Past Medical History  Diagnosis Date  . Ventricular tachycardia ([place])   . Hemorrhage     Post-colonostomy  . Hernia   . CHF (congestive heart failure) ([place])   . Systolic dysfunction, left ventricle   . AAA (abdominal aortic aneurysm) ([place])     7/13 3.8cm  . Dyslipidemia   .  Automatic implantable cardioverter-defibrillator in situ   . High cholesterol   . Asthma     "seasonal; some foods"   . Sleep apnea     "lost 60# & don't have it anymore" (01/11/2014)  . Arthritis     "joints" (01/11/2014)  . Prostate cancer ([place])   . S/P ICD (internal cardiac defibrillator) procedure, 01/11/14 removal of ERI gen and placement of Medtronic Evera XT VR & NEW Right ventricular lead Medtronic 01/12/2014  . Dysrhythmia   . Shortness of breath     with exerton   . Lumbar vertebral fracture (HCC)     L1- 04/11/2014     Past Surgical History  Procedure Laterality Date  . Nm myocar perf wall motion  01/29/2012    abnormal c/o infarct/scar,no ischemia present  . Cardiac catheterization  08/20/2004    noncritical CAD,mild global hypokinesis, EF 50%  . Prostate biopsy N/A 11/22/2013    Procedure: PROSTATE BIOPSY AND ULTRASOUND;  Surgeon: Ailene Rud, MD;  Location: [facility];  Service: Urology;  Laterality: N/A;  . Icd generator change  01/11/2014    "upgrade"  . Colectomy  1990's  . Hernia repair      "abdomen; from colon OR"  . Cataract extraction w/ intraocular lens implant Right   . Cardiac defibrillator placement  08/23/2004    Medtronic  . Cryoablation N/A 02/24/2014    Procedure: CRYO ABLATION PROSTATE;  Surgeon: Ailene Rud, MD;  Location: [facility];  Service: Urology;  Laterality: N/A;  . Implantable cardioverter defibrillator (icd) generator change N/A 01/11/2014    Procedure:  ICD GENERATOR CHANGE;  Surgeon: William Klein, MD;  Location: [facility];  Service: Cardiovascular;  Laterality: N/A;  . Lead revision N/A 01/11/2014    Procedure: LEAD REVISION;  Surgeon: William Klein, MD;  Location: [place] CATH LAB;  Service: Cardiovascular;  Laterality: N/A;     Current Outpatient Prescriptions  Medication Sig Dispense Refill  . acetaminophen (TYLENOL) 500 MG tablet Take 500 mg by mouth every 6 (six) hours as needed (for pain).     Marland Kitchen amiodarone (PACERONE) 200 MG  tablet Take 0.5 tablets (100 mg total) by mouth daily. 45 tablet 3  . ezetimibe-simvastatin (VYTORIN) 10-20 MG per tablet Take 1 tablet by mouth at bedtime.    Marland Kitchen loratadine (CLARITIN) 10 MG tablet Take 10 mg by mouth daily as needed for allergies.     . metoprolol tartrate (LOPRESSOR) 25 MG tablet TAKE 2 TABLETS 2 TIMES A DAY. 360 tablet 1  . polyethylene glycol (MIRALAX / GLYCOLAX) packet Take 17 g by mouth every evening.     No current facility-administered medications for this visit.    Allergies:   Crestor and Lipitor    Social History:  The patient  reports that he has never smoked. He has never used smokeless tobacco. He reports that he drinks alcohol. He reports that he does not use illicit drugs.   Family History:  The patient's family history includes Heart failure in his father and mother.    ROS:  Please see the history of present illness.    Otherwise, review of systems positive for none.   All other systems are reviewed and negative.    PHYSICAL EXAM: VS:  BP 100/60 mmHg  Pulse 67  Ht 5\' 8"  (1.727 m)  Wt 233 lb 4.8 oz (105.824 kg)  BMI 35.48 kg/m2 , BMI Body mass index is 35.48 kg/(m^2).  General: Alert, oriented x3, no distress Head: no evidence of trauma, PERRL, EOMI, no exophtalmos or lid lag, no myxedema, no xanthelasma; normal ears, nose and oropharynx Neck: normal jugular venous pulsations and no hepatojugular reflux; brisk carotid pulses without delay and no carotid bruits Chest: clear to auscultation, no signs of consolidation by percussion or palpation, normal fremitus, symmetrical and full respiratory excursions Cardiovascular: normal position and quality of the apical impulse, regular rhythm, normal first and second heart sounds, no murmurs, rubs or gallops Abdomen: no tenderness or distention, no masses by palpation, no abnormal pulsatility or arterial bruits, normal bowel sounds, no hepatosplenomegaly Extremities: no clubbing, cyanosis or edema; 2+ radial,  ulnar and brachial pulses bilaterally; 2+ right femoral, posterior tibial and dorsalis pedis pulses; 2+ left femoral, posterior tibial and dorsalis pedis pulses; no subclavian or femoral bruits Neurological: grossly nonfocal Psych: euthymic mood, full affect   EKG:  EKG is ordered today. The ekg ordered today demonstrates sinus rhythm with first-degree AV block and frequent monomorphic PVCs in a pattern of trigeminy. The QRS shows a nonspecific intraventricular conduction delay with left axis deviation   Recent Labs: 03/07/2015: ALT 15; BUN 27*; Creat 1.28; Potassium 4.6; Sodium 138; TSH 1.988    Lipid Panel No results found for: CHOL, TRIG, HDL, CHOLHDL, VLDL, LDLCALC, LDLDIRECT    Wt Readings from Last 3 Encounters:  08/29/15 233 lb 4.8 oz (105.824 kg)  03/07/15 235 lb 3.2 oz (106.686 kg)  09/06/14 232 lb 4.8 oz (105.371 kg)       ASSESSMENT AND PLAN:  Sustained VT (ventricular tachycardia) He has had only one episode of sustained VT since implantation of his  new generator. Attempts at discontinuation of amiodarone in the past led to recurrent episodes of VT. He does not drive. Infrequent episodes of nonsustained VT on current low-dose amiodarone. Normal LFTs and TSH roughly 5 months ago.  S/P ICD (internal cardiac defibrillator) procedure, 01/11/14 removal of ERI gen and placement of Medtronic Evera XT VR & NEW Right ventricular lead Medtronic Normal device function. Schedule for a remote download in 3 months and an office evaluation in 6 months.  Nonischemic cardiomyopathy Left ventricular ejection fraction has been between 45 and 50% depending on the study used for evaluation. It has been stable at this level for the last 10 years. He has excellent functional status (NYHA class I) and is clinically euvolemic. He has frequent PVCs that have a generally monomorphic pattern. He has never had manifestations of congestive heart failure and has never taken diarrhetic's or RAAS  inhibitors.   AAA (abdominal aortic aneurysm) Duplex ultrasound in September showed no change in aneurysm size since last evaluation, maximum diameter 3.7 cm; repeat in one year  Chronic amiodarone therapy Check LFTs and TSH every 6 months. Monitor for signs of statin myotoxicity while on combined simvastatin and amiodarone therapy. He plans to have the labs drawn at his January follow-up with his primary care physician.  History of obstructive sleep apnea Notwithstanding the patient reports and his wife's report of improvement in his snoring and lack of witnessed apnea events, I suspect this is the most likely reason for daytime fatigue. He does not express any interest in using CPAP.   Current medicines are reviewed at length with the patient today.  The patient does not have concerns regarding medicines.  The following changes have been made:  no change  Labs/ tests ordered today include:  No orders of the defined types were placed in this encounter.    There are no Patient Instructions on file for this visit.    Mikael Spray, MD  08/29/2015 10:13 AM    William Klein, MD, [facility] [phone] office [phone] pager

## 2015-08-29 ENCOUNTER — Encounter: Payer: Self-pay | Admitting: Cardiovascular Disease

## 2015-08-29 ENCOUNTER — Ambulatory Visit (INDEPENDENT_AMBULATORY_CARE_PROVIDER_SITE_OTHER): Payer: Medicare Other | Admitting: Cardiovascular Disease

## 2015-08-29 VITALS — BP 100/60 | HR 67 | Ht 68.0 in | Wt 233.3 lb

## 2015-08-29 DIAGNOSIS — I472 Ventricular tachycardia, unspecified: Secondary | ICD-10-CM

## 2015-08-29 DIAGNOSIS — I429 Cardiomyopathy, unspecified: Secondary | ICD-10-CM | POA: Diagnosis not present

## 2015-08-29 DIAGNOSIS — I428 Other cardiomyopathies: Secondary | ICD-10-CM

## 2015-08-29 DIAGNOSIS — I714 Abdominal aortic aneurysm, without rupture, unspecified: Secondary | ICD-10-CM

## 2015-08-29 DIAGNOSIS — Z79899 Other long term (current) drug therapy: Secondary | ICD-10-CM

## 2015-08-29 DIAGNOSIS — G4733 Obstructive sleep apnea (adult) (pediatric): Secondary | ICD-10-CM

## 2015-08-29 DIAGNOSIS — Z23 Encounter for immunization: Secondary | ICD-10-CM | POA: Diagnosis not present

## 2015-08-29 DIAGNOSIS — I2510 Atherosclerotic heart disease of native coronary artery without angina pectoris: Secondary | ICD-10-CM | POA: Diagnosis not present

## 2015-08-29 NOTE — Patient Instructions (Signed)
Your physician recommends that you return for lab work in: AT [place]  Remote monitoring is used to monitor your Pacemaker from home. This monitoring reduces the number of office visits required to check your device to one time per year. It allows [place] to monitor the functioning of your device to ensure it is working properly. You are scheduled for a device check from home on November 29, 2015. You may send your transmission at any time that day. If you have a wireless device, the transmission will be sent automatically. After your physician reviews your transmission, you will receive a postcard with your next transmission date.  Dr. Sallyanne Kuster recommends that you schedule a follow-up appointment in: [place] ([place]).

## 2015-09-05 LAB — CUP PACEART INCLINIC DEVICE CHECK
Battery Remaining Longevity: 128 mo
Brady Statistic RV Percent Paced: 0.02 %
Date Time Interrogation Session: 20161115155900
HIGH POWER IMPEDANCE MEASURED VALUE: 72 Ohm
Implantable Lead Implant Date: 20150331
Implantable Lead Location: 753860
Lead Channel Pacing Threshold Amplitude: 0.625 V
Lead Channel Sensing Intrinsic Amplitude: 4.375 mV
Lead Channel Setting Pacing Amplitude: 2.5 V
Lead Channel Setting Pacing Pulse Width: 0.4 ms
MDC IDC MSMT BATTERY VOLTAGE: 3.03 V
MDC IDC MSMT LEADCHNL RV IMPEDANCE VALUE: 399 Ohm
MDC IDC MSMT LEADCHNL RV IMPEDANCE VALUE: 456 Ohm
MDC IDC MSMT LEADCHNL RV PACING THRESHOLD PULSEWIDTH: 0.4 ms
MDC IDC MSMT LEADCHNL RV SENSING INTR AMPL: 4.375 mV
MDC IDC SET LEADCHNL RV SENSING SENSITIVITY: 0.3 mV

## 2015-09-14 ENCOUNTER — Encounter: Payer: Self-pay | Admitting: Cardiovascular Disease

## 2015-09-15 DIAGNOSIS — C61 Malignant neoplasm of prostate: Secondary | ICD-10-CM | POA: Diagnosis not present

## 2015-10-13 ENCOUNTER — Other Ambulatory Visit: Payer: Self-pay | Admitting: Cardiovascular Disease

## 2015-10-13 NOTE — Telephone Encounter (Signed)
REFILL 

## 2015-10-17 DIAGNOSIS — R7301 Impaired fasting glucose: Secondary | ICD-10-CM | POA: Diagnosis not present

## 2015-10-17 DIAGNOSIS — E784 Other hyperlipidemia: Secondary | ICD-10-CM | POA: Diagnosis not present

## 2015-10-17 DIAGNOSIS — Z125 Encounter for screening for malignant neoplasm of prostate: Secondary | ICD-10-CM | POA: Diagnosis not present

## 2015-10-24 DIAGNOSIS — H8110 Benign paroxysmal vertigo, unspecified ear: Secondary | ICD-10-CM | POA: Diagnosis not present

## 2015-10-24 DIAGNOSIS — Z0000 Encounter for general adult medical examination without abnormal findings: Secondary | ICD-10-CM | POA: Diagnosis not present

## 2015-10-24 DIAGNOSIS — Z95810 Presence of automatic (implantable) cardiac defibrillator: Secondary | ICD-10-CM | POA: Diagnosis not present

## 2015-10-24 DIAGNOSIS — C61 Malignant neoplasm of prostate: Secondary | ICD-10-CM | POA: Diagnosis not present

## 2015-10-24 DIAGNOSIS — R32 Unspecified urinary incontinence: Secondary | ICD-10-CM | POA: Diagnosis not present

## 2015-10-24 DIAGNOSIS — D692 Other nonthrombocytopenic purpura: Secondary | ICD-10-CM | POA: Diagnosis not present

## 2015-10-24 DIAGNOSIS — Z1389 Encounter for screening for other disorder: Secondary | ICD-10-CM | POA: Diagnosis not present

## 2015-10-24 DIAGNOSIS — I429 Cardiomyopathy, unspecified: Secondary | ICD-10-CM | POA: Diagnosis not present

## 2015-10-24 DIAGNOSIS — Z6834 Body mass index (BMI) 34.0-34.9, adult: Secondary | ICD-10-CM | POA: Diagnosis not present

## 2015-10-24 DIAGNOSIS — E668 Other obesity: Secondary | ICD-10-CM | POA: Diagnosis not present

## 2015-10-24 DIAGNOSIS — I714 Abdominal aortic aneurysm, without rupture: Secondary | ICD-10-CM | POA: Diagnosis not present

## 2015-10-24 DIAGNOSIS — K8689 Other specified diseases of pancreas: Secondary | ICD-10-CM | POA: Diagnosis not present

## 2015-10-30 ENCOUNTER — Other Ambulatory Visit: Payer: Self-pay | Admitting: Cardiovascular Disease

## 2015-10-31 DIAGNOSIS — H02102 Unspecified ectropion of right lower eyelid: Secondary | ICD-10-CM | POA: Diagnosis not present

## 2015-10-31 DIAGNOSIS — H2512 Age-related nuclear cataract, left eye: Secondary | ICD-10-CM | POA: Diagnosis not present

## 2015-10-31 DIAGNOSIS — H52203 Unspecified astigmatism, bilateral: Secondary | ICD-10-CM | POA: Diagnosis not present

## 2015-11-29 ENCOUNTER — Ambulatory Visit (INDEPENDENT_AMBULATORY_CARE_PROVIDER_SITE_OTHER): Payer: Medicare Other | Admitting: *Deleted

## 2015-11-29 DIAGNOSIS — I428 Other cardiomyopathies: Secondary | ICD-10-CM

## 2015-11-29 DIAGNOSIS — I429 Cardiomyopathy, unspecified: Secondary | ICD-10-CM | POA: Diagnosis not present

## 2015-11-29 NOTE — Progress Notes (Signed)
Remote ICD transmission.   

## 2015-12-04 LAB — CUP PACEART REMOTE DEVICE CHECK
Battery Remaining Longevity: 126 mo
Battery Voltage: 3.03 V
Brady Statistic RV Percent Paced: 0.02 %
Date Time Interrogation Session: 20170215062303
HIGH POWER IMPEDANCE MEASURED VALUE: 69 Ohm
Implantable Lead Location: 753860
Lead Channel Impedance Value: 456 Ohm
Lead Channel Impedance Value: 475 Ohm
Lead Channel Pacing Threshold Amplitude: 0.625 V
Lead Channel Sensing Intrinsic Amplitude: 4.125 mV
Lead Channel Setting Pacing Amplitude: 2.5 V
Lead Channel Setting Pacing Pulse Width: 0.4 ms
Lead Channel Setting Sensing Sensitivity: 0.3 mV
MDC IDC LEAD IMPLANT DT: 20150331
MDC IDC MSMT LEADCHNL RV PACING THRESHOLD PULSEWIDTH: 0.4 ms
MDC IDC MSMT LEADCHNL RV SENSING INTR AMPL: 4.125 mV

## 2015-12-08 ENCOUNTER — Encounter: Payer: Self-pay | Admitting: Cardiology

## 2016-01-08 ENCOUNTER — Other Ambulatory Visit: Payer: Self-pay | Admitting: Cardiovascular Disease

## 2016-01-17 ENCOUNTER — Ambulatory Visit (HOSPITAL_COMMUNITY)
Admission: RE | Admit: 2016-01-17 | Discharge: 2016-01-17 | Disposition: A | Discharge status: Home / Self Care | Payer: Medicare Other | Source: Ambulatory Visit | Attending: Cardiovascular Disease | Admitting: Cardiovascular Disease

## 2016-01-17 DIAGNOSIS — E785 Hyperlipidemia, unspecified: Secondary | ICD-10-CM | POA: Insufficient documentation

## 2016-01-17 DIAGNOSIS — I2510 Atherosclerotic heart disease of native coronary artery without angina pectoris: Secondary | ICD-10-CM | POA: Insufficient documentation

## 2016-01-17 DIAGNOSIS — I714 Abdominal aortic aneurysm, without rupture, unspecified: Secondary | ICD-10-CM

## 2016-01-17 DIAGNOSIS — I700 Atherosclerosis of aorta: Secondary | ICD-10-CM | POA: Insufficient documentation

## 2016-01-17 DIAGNOSIS — Z87891 Personal history of nicotine dependence: Secondary | ICD-10-CM | POA: Insufficient documentation

## 2016-03-05 ENCOUNTER — Ambulatory Visit (INDEPENDENT_AMBULATORY_CARE_PROVIDER_SITE_OTHER): Payer: Medicare Other | Admitting: Cardiovascular Disease

## 2016-03-05 ENCOUNTER — Encounter: Payer: Self-pay | Admitting: Cardiovascular Disease

## 2016-03-05 VITALS — BP 128/58 | HR 75 | Ht 68.0 in | Wt 237.4 lb

## 2016-03-05 DIAGNOSIS — I472 Ventricular tachycardia, unspecified: Secondary | ICD-10-CM

## 2016-03-05 DIAGNOSIS — I714 Abdominal aortic aneurysm, without rupture, unspecified: Secondary | ICD-10-CM

## 2016-03-05 DIAGNOSIS — I2510 Atherosclerotic heart disease of native coronary artery without angina pectoris: Secondary | ICD-10-CM

## 2016-03-05 DIAGNOSIS — I429 Cardiomyopathy, unspecified: Secondary | ICD-10-CM

## 2016-03-05 DIAGNOSIS — Z79899 Other long term (current) drug therapy: Secondary | ICD-10-CM | POA: Diagnosis not present

## 2016-03-05 DIAGNOSIS — E785 Hyperlipidemia, unspecified: Secondary | ICD-10-CM

## 2016-03-05 DIAGNOSIS — I428 Other cardiomyopathies: Secondary | ICD-10-CM

## 2016-03-05 DIAGNOSIS — Z95810 Presence of automatic (implantable) cardiac defibrillator: Secondary | ICD-10-CM

## 2016-03-05 LAB — CUP PACEART INCLINIC DEVICE CHECK
Battery Voltage: 3.03 V
Brady Statistic RV Percent Paced: 0.02 %
Date Time Interrogation Session: 20170523104612
HighPow Impedance: 69 Ohm
Implantable Lead Implant Date: 20150331
Implantable Lead Location: 753860
Lead Channel Impedance Value: 342 Ohm
Lead Channel Impedance Value: 418 Ohm
Lead Channel Sensing Intrinsic Amplitude: 5.125 mV
Lead Channel Sensing Intrinsic Amplitude: 5.125 mV
Lead Channel Setting Pacing Amplitude: 2.5 V
MDC IDC MSMT BATTERY REMAINING LONGEVITY: 124 mo
MDC IDC MSMT LEADCHNL RV PACING THRESHOLD AMPLITUDE: 0.75 V
MDC IDC MSMT LEADCHNL RV PACING THRESHOLD PULSEWIDTH: 0.4 ms
MDC IDC SET LEADCHNL RV PACING PULSEWIDTH: 0.4 ms
MDC IDC SET LEADCHNL RV SENSING SENSITIVITY: 0.3 mV

## 2016-03-05 NOTE — Patient Instructions (Signed)
Dr Sallyanne Kuster recommends that you continue on your current medications as directed. Please refer to the Current Medication list given to you today.  Remote monitoring is used to monitor your Pacemaker of ICD from home. This monitoring reduces the number of office visits required to check your device to one time per year. It allows [place] to keep an eye on the functioning of your device to ensure it is working properly. You are scheduled for a device check from home on Thursday, August 24th, 2017. You may send your transmission at any time that day. If you have a wireless device, the transmission will be sent automatically. After your physician reviews your transmission, you will receive a postcard with your next transmission date.   Dr Sallyanne Kuster recommends that you schedule a follow-up appointment in 6 months with a defibrillator check. You will receive a reminder letter in the mail two months in advance. If you don't receive a letter, please call our office to schedule the follow-up appointment.  If you need a refill on your cardiac medications before your next appointment, please call your pharmacy.

## 2016-03-05 NOTE — Progress Notes (Signed)
Patient ID: William [name], male   DOB: [birth year], 82 y.o.   MRN: [id number]    Cardiology Office Note    Date:  03/06/2016   ID:  William [name], DOB [id number], MRN [id number]  PCP:  Haywood Pao, MD  Cardiologist:   Sanda Klein, MD   Chief Complaint  Patient presents with  . Follow-up    defib check    History of Present Illness:  William [name] is a 82 y.o. male with recurrent nonsustained and sustained ventricular tachycardia in the setting of mild nonischemic cardiomyopathy without overt congestive heart failure. Attempts to wean amiodarone therapy have lead to recurrent ventricular arrhythmia, but he responds very well to the antiarrhythmic when it is restarted. 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 He has a small infrarenal abdominal aortic aneurysm, recently increased in size from 3.7 cm to 4 cm by duplex ultrasonography. He underwent a dual-chamber defibrillator generator change and placement of a new defibrillator lead (old lead was Sprint fidelis under advisory) in 2015. He had successful termination of sustained VT with antitachycardia pacing in February 2016.  He does not have any complaints today and specifically denies palpitations, dizziness, lightheadedness or syncope. He does not have lower extremity edema. He has recovered well from his spinal fracture after his fall roughly one year ago.  Defibrillator interrogation shows normal device function. Generator is still at beginning of life. He does not require ventricular pacing. He has only had 3 episodes of nonsustained ventricular tachycardia consisting of 6-8 beats, all asymptomatic and relatively slow at around 150 beats a minute.  Past Medical History  Diagnosis Date  . Ventricular tachycardia ([place])   . Hemorrhage     Post-colonostomy  . Hernia   . CHF (congestive heart failure) ([place])   . Systolic dysfunction, left ventricle   . AAA (abdominal aortic aneurysm) ([place])     7/13  3.8cm  . Dyslipidemia   . Automatic implantable cardioverter-defibrillator in situ   . High cholesterol   . Asthma     "seasonal; some foods"   . Sleep apnea     "lost 60# & don't have it anymore" (01/11/2014)  . Arthritis     "joints" (01/11/2014)  . Prostate cancer ([place])   . S/P ICD (internal cardiac defibrillator) procedure, 01/11/14 removal of ERI gen and placement of Medtronic Evera XT VR & NEW Right ventricular lead Medtronic 01/12/2014  . Dysrhythmia   . Shortness of breath     with exerton   . Lumbar vertebral fracture (HCC)     L1- 04/11/2014     Past Surgical History  Procedure Laterality Date  . Nm myocar perf wall motion  01/29/2012    abnormal c/o infarct/scar,no ischemia present  . Cardiac catheterization  08/20/2004    noncritical CAD,mild global hypokinesis, EF 50%  . Prostate biopsy N/A 11/22/2013    Procedure: PROSTATE BIOPSY AND ULTRASOUND;  Surgeon: Ailene Rud, MD;  Location: [facility];  Service: Urology;  Laterality: N/A;  . Icd generator change  01/11/2014    "upgrade"  . Colectomy  1990's  . Hernia repair      "abdomen; from colon OR"  . Cataract extraction w/ intraocular lens implant Right   . Cardiac defibrillator placement  08/23/2004    Medtronic  . Cryoablation N/A 02/24/2014    Procedure: CRYO ABLATION PROSTATE;  Surgeon: Ailene Rud, MD;  Location: [facility];  Service: Urology;  Laterality: N/A;  . Implantable cardioverter defibrillator (icd)  generator change N/A 01/11/2014    Procedure: ICD GENERATOR CHANGE;  Surgeon: Sanda Klein, MD;  Location: [facility] CATH LAB;  Service: Cardiovascular;  Laterality: N/A;  . Lead revision N/A 01/11/2014    Procedure: LEAD REVISION;  Surgeon: Sanda Klein, MD;  Location: [place] CATH LAB;  Service: Cardiovascular;  Laterality: N/A;    Current Medications: Outpatient Prescriptions Prior to Visit  Medication Sig Dispense Refill  . acetaminophen (TYLENOL) 500 MG tablet Take 500 mg by mouth every 6 (six) hours as  needed (for pain).     Marland Kitchen amiodarone (PACERONE) 200 MG tablet TAKE 1/2 TABLET DAILY. 45 tablet 3  . ezetimibe-simvastatin (VYTORIN) 10-20 MG per tablet Take 1 tablet by mouth at bedtime.    Marland Kitchen loratadine (CLARITIN) 10 MG tablet Take 10 mg by mouth daily as needed for allergies.     . metoprolol tartrate (LOPRESSOR) 25 MG tablet TAKE 2 TABLETS BY MOUTH TWICE DAILY 360 tablet 1  . polyethylene glycol (MIRALAX / GLYCOLAX) packet Take 17 g by mouth every evening.     No facility-administered medications prior to visit.     Allergies:   Crestor and Lipitor   Social History   Social History  . Marital Status: Married    Spouse Name: N/A  . Number of Children: N/A  . Years of Education: N/A   Social History Main Topics  . Smoking status: Never Smoker   . Smokeless tobacco: Never Used  . Alcohol Use: 0.0 oz/week    0 Cans of beer per week     Comment: 01/11/2014 "drink 1/2 of a beer twice per month   . Drug Use: No  . Sexual Activity: No   Other Topics Concern  . None   Social History Narrative     Family History:  The patient's family history includes Heart failure in his father and mother.   ROS:   Please see the history of present illness.    ROS All other systems reviewed and are negative.   PHYSICAL EXAM:   VS:  BP 128/58 mmHg  Pulse 75  Ht 5\' 8"  (1.727 m)  Wt 107.684 kg (237 lb 6.4 oz)  BMI 36.10 kg/m2  SpO2 97%   GEN: Well nourished, well developed, in no acute distress HEENT: normal Neck: no JVD, carotid bruits, or masses Cardiac: Widely split second heart sound , RRR alternating with bigeminal rhythm; no murmurs, rubs, or gallops,no edema , healthy defibrillator site Respiratory:  clear to auscultation bilaterally, normal work of breathing GI: soft, nontender, nondistended, + BS MS: no deformity or atrophy Skin: warm and dry, no rash Neuro:  Alert and Oriented x 3, Strength and sensation are intact Psych: euthymic mood, full affect  Wt Readings from Last 3  Encounters:  03/05/16 107.684 kg (237 lb 6.4 oz)  08/29/15 105.824 kg (233 lb 4.8 oz)  03/07/15 106.686 kg (235 lb 3.2 oz)      Studies/Labs Reviewed:   EKG:  EKG is ordered today.  The ekg ordered today demonstrates Sinus rhythm with PVCs in a pattern of bigeminy and first-degree A-V block (PR 228 ms), right bundle branch block and left anterior fascicular block, QRS 134 ms, QTC 473 ms  Recent Labs: 03/07/2015: ALT 15; BUN 27*; Creat 1.28; Potassium 4.6; Sodium 138; TSH 1.988      ASSESSMENT:    1. Sustained VT (ventricular tachycardia) ([place])   2. Nonischemic cardiomyopathy ([place])   3. On amiodarone therapy   4. Abdominal aortic aneurysm (AAA) without rupture (  [place])   5. S/P ICD (internal cardiac defibrillator) procedure, 01/11/14 removal of ERI gen and placement of Medtronic Evera XT VR & NEW Right ventricular lead Medtronic   6. Atherosclerosis of native coronary artery of native heart without angina pectoris   7. Hyperlipidemia      PLAN:  In order of problems listed above:  1. VT: Asymptomatic brief episodes of nonsustained VT since his last device check. He responds very well to very low doses of amiodarone 2. CMP: Mildly depressed left ventricular ejection fraction without overt congestive heart failure by symptoms or physical exam 3. Amiodarone: Responds very well to even very low doses of this drug, but attempts to wean it off of lead to recurrent VT that led to defibrillator therapies. Check liver function tests and thyroid studies at least once every 6 months while on this medication. Minded him about that need to avoid sun exposure and need for yearly ophthalmological exams. He should report pulmonary symptoms promptly 4. AAA: Recently with slight increase in size. Continue periodic monitoring. It remains asymptomatic. 5. ICD: Normal device function. Continue remote downloads every 3 months and office visits every 6 months 6. CAD: He has not experienced angina pectoris  and has not had critical lesions by angiography 7. HLP: Target LDL less than 100, preferably less than 70. Potential adverse interaction between amiodarone and simvastatin noted. He has tolerated this combination even when he was on higher doses of amiodarone. Get lipid profile from primary care provider.  Medication Adjustments/Labs and Tests Ordered: Current medicines are reviewed at length with the patient today.  Concerns regarding medicines are outlined above.  Medication changes, Labs and Tests ordered today are listed in the Patient Instructions below. Patient Instructions  Dr Sallyanne Kuster recommends that you continue on your current medications as directed. Please refer to the Current Medication list given to you today.  Remote monitoring is used to monitor your Pacemaker of ICD from home. This monitoring reduces the number of office visits required to check your device to one time per year. It allows [place] to keep an eye on the functioning of your device to ensure it is working properly. You are scheduled for a device check from home on Thursday, August 24th, 2017. You may send your transmission at any time that day. If you have a wireless device, the transmission will be sent automatically. After your physician reviews your transmission, you will receive a postcard with your next transmission date.   Dr Sallyanne Kuster recommends that you schedule a follow-up appointment in 6 months with a defibrillator check. You will receive a reminder letter in the mail two months in advance. If you don't receive a letter, please call our office to schedule the follow-up appointment.  If you need a refill on your cardiac medications before your next appointment, please call your pharmacy.    Signed, Sanda Klein, MD  03/06/2016 8:39 AM    [facility] [address], [place], [place]  [address] Phone: [phone]; Fax: [phone]

## 2016-03-06 ENCOUNTER — Other Ambulatory Visit: Payer: Self-pay

## 2016-03-06 DIAGNOSIS — Z79899 Other long term (current) drug therapy: Secondary | ICD-10-CM

## 2016-03-06 NOTE — Addendum Note (Signed)
Addended by: Diana Eves on: 03/06/2016 05:55 PM   Modules accepted: Orders

## 2016-03-06 NOTE — Progress Notes (Signed)
Encounter opened in error

## 2016-03-22 ENCOUNTER — Encounter: Payer: Self-pay | Admitting: Cardiovascular Disease

## 2016-03-26 DIAGNOSIS — N3941 Urge incontinence: Secondary | ICD-10-CM | POA: Diagnosis not present

## 2016-03-26 DIAGNOSIS — C61 Malignant neoplasm of prostate: Secondary | ICD-10-CM | POA: Diagnosis not present

## 2016-04-25 ENCOUNTER — Encounter: Payer: Self-pay | Admitting: Cardiovascular Disease

## 2016-04-25 DIAGNOSIS — Z79899 Other long term (current) drug therapy: Secondary | ICD-10-CM | POA: Diagnosis not present

## 2016-04-25 LAB — TSH: TSH: 2.9 mIU/L (ref 0.40–4.50)

## 2016-04-26 LAB — COMPREHENSIVE METABOLIC PANEL
ALT: 12 U/L (ref 9–46)
AST: 20 U/L (ref 10–35)
Albumin: 3.6 g/dL (ref 3.6–5.1)
Alkaline Phosphatase: 79 U/L (ref 40–115)
BUN: 18 mg/dL (ref 7–25)
CALCIUM: 8.9 mg/dL (ref 8.6–10.3)
CO2: 21 mmol/L (ref 20–31)
Chloride: 101 mmol/L (ref 98–110)
Creat: 1.19 mg/dL — ABNORMAL HIGH (ref 0.70–1.11)
Glucose, Bld: 85 mg/dL (ref 65–99)
POTASSIUM: 4.4 mmol/L (ref 3.5–5.3)
Sodium: 136 mmol/L (ref 135–146)
Total Bilirubin: 1.2 mg/dL (ref 0.2–1.2)
Total Protein: 7.1 g/dL (ref 6.1–8.1)

## 2016-04-30 DIAGNOSIS — D692 Other nonthrombocytopenic purpura: Secondary | ICD-10-CM | POA: Diagnosis not present

## 2016-04-30 DIAGNOSIS — C61 Malignant neoplasm of prostate: Secondary | ICD-10-CM | POA: Diagnosis not present

## 2016-04-30 DIAGNOSIS — I429 Cardiomyopathy, unspecified: Secondary | ICD-10-CM | POA: Diagnosis not present

## 2016-04-30 DIAGNOSIS — I472 Ventricular tachycardia: Secondary | ICD-10-CM | POA: Diagnosis not present

## 2016-04-30 DIAGNOSIS — H6123 Impacted cerumen, bilateral: Secondary | ICD-10-CM | POA: Diagnosis not present

## 2016-04-30 DIAGNOSIS — Z95810 Presence of automatic (implantable) cardiac defibrillator: Secondary | ICD-10-CM | POA: Diagnosis not present

## 2016-04-30 DIAGNOSIS — R32 Unspecified urinary incontinence: Secondary | ICD-10-CM | POA: Diagnosis not present

## 2016-04-30 DIAGNOSIS — E668 Other obesity: Secondary | ICD-10-CM | POA: Diagnosis not present

## 2016-04-30 DIAGNOSIS — I10 Essential (primary) hypertension: Secondary | ICD-10-CM | POA: Diagnosis not present

## 2016-04-30 DIAGNOSIS — Z6835 Body mass index (BMI) 35.0-35.9, adult: Secondary | ICD-10-CM | POA: Diagnosis not present

## 2016-04-30 DIAGNOSIS — I714 Abdominal aortic aneurysm, without rupture: Secondary | ICD-10-CM | POA: Diagnosis not present

## 2016-04-30 DIAGNOSIS — R7301 Impaired fasting glucose: Secondary | ICD-10-CM | POA: Diagnosis not present

## 2016-05-24 DIAGNOSIS — I10 Essential (primary) hypertension: Secondary | ICD-10-CM | POA: Diagnosis not present

## 2016-05-24 DIAGNOSIS — I429 Cardiomyopathy, unspecified: Secondary | ICD-10-CM | POA: Diagnosis not present

## 2016-05-24 DIAGNOSIS — Z6835 Body mass index (BMI) 35.0-35.9, adult: Secondary | ICD-10-CM | POA: Diagnosis not present

## 2016-05-24 DIAGNOSIS — H8110 Benign paroxysmal vertigo, unspecified ear: Secondary | ICD-10-CM | POA: Diagnosis not present

## 2016-05-24 DIAGNOSIS — R001 Bradycardia, unspecified: Secondary | ICD-10-CM | POA: Diagnosis not present

## 2016-05-24 DIAGNOSIS — I480 Paroxysmal atrial fibrillation: Secondary | ICD-10-CM | POA: Diagnosis not present

## 2016-06-25 DIAGNOSIS — H8113 Benign paroxysmal vertigo, bilateral: Secondary | ICD-10-CM | POA: Diagnosis not present

## 2016-06-27 DIAGNOSIS — H8113 Benign paroxysmal vertigo, bilateral: Secondary | ICD-10-CM | POA: Diagnosis not present

## 2016-07-01 DIAGNOSIS — H8113 Benign paroxysmal vertigo, bilateral: Secondary | ICD-10-CM | POA: Diagnosis not present

## 2016-07-04 DIAGNOSIS — H8113 Benign paroxysmal vertigo, bilateral: Secondary | ICD-10-CM | POA: Diagnosis not present

## 2016-07-09 DIAGNOSIS — H8113 Benign paroxysmal vertigo, bilateral: Secondary | ICD-10-CM | POA: Diagnosis not present

## 2016-07-16 DIAGNOSIS — H8113 Benign paroxysmal vertigo, bilateral: Secondary | ICD-10-CM | POA: Diagnosis not present

## 2016-07-18 DIAGNOSIS — H8113 Benign paroxysmal vertigo, bilateral: Secondary | ICD-10-CM | POA: Diagnosis not present

## 2016-07-23 ENCOUNTER — Ambulatory Visit (INDEPENDENT_AMBULATORY_CARE_PROVIDER_SITE_OTHER): Payer: Medicare Other | Admitting: Cardiovascular Disease

## 2016-07-23 VITALS — BP 117/70 | HR 57 | Ht 68.0 in | Wt 239.6 lb

## 2016-07-23 DIAGNOSIS — I428 Other cardiomyopathies: Secondary | ICD-10-CM

## 2016-07-23 DIAGNOSIS — Z95810 Presence of automatic (implantable) cardiac defibrillator: Secondary | ICD-10-CM | POA: Diagnosis not present

## 2016-07-23 DIAGNOSIS — Z79899 Other long term (current) drug therapy: Secondary | ICD-10-CM

## 2016-07-23 DIAGNOSIS — Z23 Encounter for immunization: Secondary | ICD-10-CM

## 2016-07-23 DIAGNOSIS — I714 Abdominal aortic aneurysm, without rupture, unspecified: Secondary | ICD-10-CM

## 2016-07-23 DIAGNOSIS — E7849 Other hyperlipidemia: Secondary | ICD-10-CM

## 2016-07-23 DIAGNOSIS — I25810 Atherosclerosis of coronary artery bypass graft(s) without angina pectoris: Secondary | ICD-10-CM | POA: Diagnosis not present

## 2016-07-23 DIAGNOSIS — Z5181 Encounter for therapeutic drug level monitoring: Secondary | ICD-10-CM

## 2016-07-23 DIAGNOSIS — I472 Ventricular tachycardia, unspecified: Secondary | ICD-10-CM

## 2016-07-23 DIAGNOSIS — E784 Other hyperlipidemia: Secondary | ICD-10-CM

## 2016-07-23 NOTE — Progress Notes (Signed)
Patient ID: William [name], male   DOB: [birth year], 82 y.o.   MRN: [id number]    Cardiology Office Note    Date:  07/24/2016   ID:  William [name], DOB [id number], MRN [id number]  PCP:  Haywood Pao, MD  Cardiologist:   Sanda Klein, MD   Chief Complaint  Patient presents with  . Follow-up    patient reports no complaints    History of Present Illness:  William [name] is a 82 y.o. male with recurrent nonsustained and sustained ventricular tachycardia in the setting of mild nonischemic cardiomyopathy without overt congestive heart failure. Attempts to wean amiodarone therapy have lead to recurrent ventricular arrhythmia, but he responds very well to the antiarrhythmic when it is restarted. 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 He has a small infrarenal abdominal aortic aneurysm, recently increased in size from 3.7 cm to 4 cm by duplex ultrasonography. He underwent a dual-chamber defibrillator generator change and placement of a new defibrillator lead (old lead was Sprint fidelis under advisory) in 2015. He had successful termination of sustained VT with antitachycardia pacing in February 2016, But has not required device shocks.  Since his fall and spinal fracture he has been very worried and has been a lot less active. He sees a physical therapist 2 hours a week but is otherwise extremely sedentary which worries his wife. He still has poor balance. His defibrillator reports roughly 2 hours of activity a day, not much changed over the last 12 months  The patient specifically denies any chest pain at rest exertion, dyspnea at rest or with exertion, orthopnea, paroxysmal nocturnal dyspnea, syncope, palpitations, focal neurological deficits, intermittent claudication, lower extremity edema, unexplained weight gain, cough, hemoptysis or wheezing.  Defibrillator interrogation shows normal device function. Generator reports the mated 10 years of life. He does not  require ventricular pacing. He has only had 2 episodes of nonsustained ventricular tachycardia consisting of 5-6 beats, asymptomatic.  Past Medical History:  Diagnosis Date  . AAA (abdominal aortic aneurysm) ([place])    7/13 3.8cm  . Arthritis    "joints" (01/11/2014)  . Asthma    "seasonal; some foods"   . Automatic implantable cardioverter-defibrillator in situ   . CHF (congestive heart failure) ([place])   . Dyslipidemia   . Dysrhythmia   . Hemorrhage    Post-colonostomy  . Hernia   . High cholesterol   . Lumbar vertebral fracture (HCC)    L1- 04/11/2014   . Prostate cancer ([place])   . S/P ICD (internal cardiac defibrillator) procedure, 01/11/14 removal of ERI gen and placement of Medtronic Evera XT VR & NEW Right ventricular lead Medtronic 01/12/2014  . Shortness of breath    with exerton   . Sleep apnea    "lost 60# & don't have it anymore" (01/11/2014)  . Systolic dysfunction, left ventricle   . Ventricular tachycardia [facility])     Past Surgical History:  Procedure Laterality Date  . CARDIAC CATHETERIZATION  08/20/2004   noncritical CAD,mild global hypokinesis, EF 50%  . CARDIAC DEFIBRILLATOR PLACEMENT  08/23/2004   Medtronic  . CATARACT EXTRACTION W/ INTRAOCULAR LENS IMPLANT Right   . COLECTOMY  1990's  . CRYOABLATION N/A 02/24/2014   Procedure: CRYO ABLATION PROSTATE;  Surgeon: Ailene Rud, MD;  Location: [facility];  Service: Urology;  Laterality: N/A;  . HERNIA REPAIR     "abdomen; from colon OR"  . ICD GENERATOR CHANGE  01/11/2014   "upgrade"  . IMPLANTABLE CARDIOVERTER DEFIBRILLATOR (ICD)  GENERATOR CHANGE N/A 01/11/2014   Procedure: ICD GENERATOR CHANGE;  Surgeon: Sanda Klein, MD;  Location: [facility] CATH LAB;  Service: Cardiovascular;  Laterality: N/A;  . LEAD REVISION N/A 01/11/2014   Procedure: LEAD REVISION;  Surgeon: Sanda Klein, MD;  Location: [place] CATH LAB;  Service: Cardiovascular;  Laterality: N/A;  . NM MYOCAR PERF WALL MOTION  01/29/2012   abnormal c/o  infarct/scar,no ischemia present  . PROSTATE BIOPSY N/A 11/22/2013   Procedure: PROSTATE BIOPSY AND ULTRASOUND;  Surgeon: Ailene Rud, MD;  Location: [facility];  Service: Urology;  Laterality: N/A;    Current Medications: Outpatient Medications Prior to Visit  Medication Sig Dispense Refill  . acetaminophen (TYLENOL) 500 MG tablet Take 500 mg by mouth every 6 (six) hours as needed (for pain).     Marland Kitchen amiodarone (PACERONE) 200 MG tablet TAKE 1/2 TABLET DAILY. 45 tablet 3  . ezetimibe-simvastatin (VYTORIN) 10-20 MG per tablet Take 1 tablet by mouth at bedtime.    Marland Kitchen loratadine (CLARITIN) 10 MG tablet Take 10 mg by mouth daily as needed for allergies.     . metoprolol tartrate (LOPRESSOR) 25 MG tablet TAKE 2 TABLETS BY MOUTH TWICE DAILY 360 tablet 1  . polyethylene glycol (MIRALAX / GLYCOLAX) packet Take 17 g by mouth every evening.     No facility-administered medications prior to visit.      Allergies:   Crestor [rosuvastatin] and Lipitor [atorvastatin]   Social History   Social History  . Marital status: Married    Spouse name: N/A  . Number of children: N/A  . Years of education: N/A   Social History Main Topics  . Smoking status: Never Smoker  . Smokeless tobacco: Never Used  . Alcohol use 0.0 oz/week     Comment: 01/11/2014 "drink 1/2 of a beer twice per month   . Drug use: No  . Sexual activity: No   Other Topics Concern  . Not on file   Social History Narrative  . No narrative on file     Family History:  The patient's family history includes Heart failure in his father and mother.   ROS:   Please see the history of present illness.    ROS All other systems reviewed and are negative.   PHYSICAL EXAM:   VS:  BP 117/70   Pulse (!) 57   Ht 5\' 8"  (1.727 m)   Wt 239 lb 9.6 oz (108.7 kg)   SpO2 97%   BMI 36.43 kg/m    GEN: Well nourished, well developed, in no acute distress  HEENT: normal  Neck: no JVD, carotid bruits, or masses Cardiac: Widely split  second heart sound , RRR alternating with bigeminal rhythm; no murmurs, rubs, or gallops,no edema , healthy defibrillator site Respiratory:  clear to auscultation bilaterally, normal work of breathing GI: soft, nontender, nondistended, + BS MS: no deformity or atrophy  Skin: warm and dry, no rash Neuro:  Alert and Oriented x 3, Strength and sensation are intact Psych: euthymic mood, full affect  Wt Readings from Last 3 Encounters:  07/23/16 239 lb 9.6 oz (108.7 kg)  03/05/16 237 lb 6.4 oz (107.7 kg)  08/29/15 233 lb 4.8 oz (105.8 kg)      Studies/Labs Reviewed:   EKG:  EKG is ordered today.  The ekg ordered today demonstrates Sinus rhythm with PVCs in a pattern of bigeminy and first-degree A-V block (PR 228 ms), right bundle branch block and left anterior fascicular block, QRS 134 ms, QTC 473  ms  Recent Labs: 04/25/2016: ALT 12; BUN 18; Creat 1.19; Potassium 4.4; Sodium 136; TSH 2.90  January 2017 lipid profile total cholesterol 118, triglycerides 111, HDL 28, LDL 68  ASSESSMENT:    1. Sustained VT (ventricular tachycardia) ([place])   2. Nonischemic cardiomyopathy ([place])   3. S/P ICD (internal cardiac defibrillator) procedure   4. Encounter for monitoring amiodarone therapy   5. Abdominal aortic aneurysm (AAA) without rupture ([place])   6. Coronary artery disease involving coronary bypass graft of native heart without angina pectoris   7. Other hyperlipidemia   8. Encounter for immunization      PLAN:  In order of problems listed above:  1. VT: Asymptomatic, brief and very infrequent episodes of nonsustained VT since his last device check. He responds very well to very low doses of amiodarone 2. CMP: Mildly depressed left ventricular ejection fraction without overt congestive heart failure by symptoms or physical exam 3. Amiodarone: Responds very well to even very low doses of this drug, but attempts to wean it off of lead to recurrent VT that led to defibrillator therapies. Checked  liver function tests and thyroid studies in July, all OK. Minded him about that need to avoid sun exposure and need for yearly ophthalmological exams. He should report pulmonary symptoms promptly 4. AAA: stable at 4 cm. Continue periodic monitoring. It remains asymptomatic. 5. ICD: Normal device function. Continue remote downloads every 3 months and office visits every 6 months 6. CAD: He has not experienced angina pectoris and has not had critical lesions by angiography 7. HLP: At target LDL less than 100, preferably less than 70. Potential adverse interaction between amiodarone and simvastatin noted. He has tolerated this combination even when he was on higher doses of amiodarone. Get lipid profile from primary care provider. 8. He received a flu shot today  Medication Adjustments/Labs and Tests Ordered: Current medicines are reviewed at length with the patient today.  Concerns regarding medicines are outlined above.  Medication changes, Labs and Tests ordered today are listed in the Patient Instructions below. Patient Instructions  Dr Sallyanne Kuster recommends that you continue on your current medications as directed. Please refer to the Current Medication list given to you today.  Remote monitoring is used to monitor your Pacemaker of ICD from home. This monitoring reduces the number of office visits required to check your device to one time per year. It allows [place] to keep an eye on the functioning of your device to ensure it is working properly. You are scheduled for a device check from home on Tuesday, January 9th, 2017. You may send your transmission at any time that day. If you have a wireless device, the transmission will be sent automatically. After your physician reviews your transmission, you will receive a postcard with your next transmission date.  Dr Sallyanne Kuster recommends that you schedule a follow-up appointment in 6 months with a pacemaker check. You will receive a reminder letter in the mail  two months in advance. If you don't receive a letter, please call our office to schedule the follow-up appointment.  If you need a refill on your cardiac medications before your next appointment, please call your pharmacy.    Signed, Sanda Klein, MD  07/24/2016 2:13 PM    [facility] [address], [place], [place]  [address] Phone: [phone]; Fax: [phone]

## 2016-07-23 NOTE — Patient Instructions (Signed)
Dr Sallyanne Kuster recommends that you continue on your current medications as directed. Please refer to the Current Medication list given to you today.  Remote monitoring is used to monitor your Pacemaker of ICD from home. This monitoring reduces the number of office visits required to check your device to one time per year. It allows [place] to keep an eye on the functioning of your device to ensure it is working properly. You are scheduled for a device check from home on Tuesday, January 9th, 2017. You may send your transmission at any time that day. If you have a wireless device, the transmission will be sent automatically. After your physician reviews your transmission, you will receive a postcard with your next transmission date.  Dr Sallyanne Kuster recommends that you schedule a follow-up appointment in 6 months with a pacemaker check. You will receive a reminder letter in the mail two months in advance. If you don't receive a letter, please call our office to schedule the follow-up appointment.  If you need a refill on your cardiac medications before your next appointment, please call your pharmacy.

## 2016-07-24 ENCOUNTER — Encounter: Payer: Self-pay | Admitting: Cardiovascular Disease

## 2016-07-25 DIAGNOSIS — H8113 Benign paroxysmal vertigo, bilateral: Secondary | ICD-10-CM | POA: Diagnosis not present

## 2016-07-29 DIAGNOSIS — H8113 Benign paroxysmal vertigo, bilateral: Secondary | ICD-10-CM | POA: Diagnosis not present

## 2016-07-31 DIAGNOSIS — H8113 Benign paroxysmal vertigo, bilateral: Secondary | ICD-10-CM | POA: Diagnosis not present

## 2016-08-05 DIAGNOSIS — H8113 Benign paroxysmal vertigo, bilateral: Secondary | ICD-10-CM | POA: Diagnosis not present

## 2016-08-08 DIAGNOSIS — H8113 Benign paroxysmal vertigo, bilateral: Secondary | ICD-10-CM | POA: Diagnosis not present

## 2016-08-13 DIAGNOSIS — H8113 Benign paroxysmal vertigo, bilateral: Secondary | ICD-10-CM | POA: Diagnosis not present

## 2016-08-16 DIAGNOSIS — H8113 Benign paroxysmal vertigo, bilateral: Secondary | ICD-10-CM | POA: Diagnosis not present

## 2016-08-21 ENCOUNTER — Other Ambulatory Visit: Payer: Self-pay | Admitting: Cardiovascular Disease

## 2016-08-21 DIAGNOSIS — H8113 Benign paroxysmal vertigo, bilateral: Secondary | ICD-10-CM | POA: Diagnosis not present

## 2016-08-21 NOTE — Telephone Encounter (Signed)
REFILL 

## 2016-08-23 DIAGNOSIS — H8113 Benign paroxysmal vertigo, bilateral: Secondary | ICD-10-CM | POA: Diagnosis not present

## 2016-08-26 DIAGNOSIS — H8113 Benign paroxysmal vertigo, bilateral: Secondary | ICD-10-CM | POA: Diagnosis not present

## 2016-08-28 DIAGNOSIS — H8113 Benign paroxysmal vertigo, bilateral: Secondary | ICD-10-CM | POA: Diagnosis not present

## 2016-09-10 DIAGNOSIS — H8113 Benign paroxysmal vertigo, bilateral: Secondary | ICD-10-CM | POA: Diagnosis not present

## 2016-09-17 DIAGNOSIS — H8113 Benign paroxysmal vertigo, bilateral: Secondary | ICD-10-CM | POA: Diagnosis not present

## 2016-09-20 DIAGNOSIS — H8113 Benign paroxysmal vertigo, bilateral: Secondary | ICD-10-CM | POA: Diagnosis not present

## 2016-09-23 DIAGNOSIS — H8113 Benign paroxysmal vertigo, bilateral: Secondary | ICD-10-CM | POA: Diagnosis not present

## 2016-09-24 DIAGNOSIS — C61 Malignant neoplasm of prostate: Secondary | ICD-10-CM | POA: Diagnosis not present

## 2016-09-27 DIAGNOSIS — C61 Malignant neoplasm of prostate: Secondary | ICD-10-CM | POA: Diagnosis not present

## 2016-09-27 DIAGNOSIS — H8113 Benign paroxysmal vertigo, bilateral: Secondary | ICD-10-CM | POA: Diagnosis not present

## 2016-09-27 DIAGNOSIS — R32 Unspecified urinary incontinence: Secondary | ICD-10-CM | POA: Diagnosis not present

## 2016-09-30 DIAGNOSIS — L308 Other specified dermatitis: Secondary | ICD-10-CM | POA: Diagnosis not present

## 2016-09-30 DIAGNOSIS — L820 Inflamed seborrheic keratosis: Secondary | ICD-10-CM | POA: Diagnosis not present

## 2016-10-01 DIAGNOSIS — H8113 Benign paroxysmal vertigo, bilateral: Secondary | ICD-10-CM | POA: Diagnosis not present

## 2016-10-03 DIAGNOSIS — H8113 Benign paroxysmal vertigo, bilateral: Secondary | ICD-10-CM | POA: Diagnosis not present

## 2016-10-09 DIAGNOSIS — H8113 Benign paroxysmal vertigo, bilateral: Secondary | ICD-10-CM | POA: Diagnosis not present

## 2016-10-18 DIAGNOSIS — H8113 Benign paroxysmal vertigo, bilateral: Secondary | ICD-10-CM | POA: Diagnosis not present

## 2016-10-21 DIAGNOSIS — H8113 Benign paroxysmal vertigo, bilateral: Secondary | ICD-10-CM | POA: Diagnosis not present

## 2016-10-22 ENCOUNTER — Ambulatory Visit (INDEPENDENT_AMBULATORY_CARE_PROVIDER_SITE_OTHER): Payer: Medicare Other | Admitting: *Deleted

## 2016-10-22 DIAGNOSIS — I10 Essential (primary) hypertension: Secondary | ICD-10-CM | POA: Diagnosis not present

## 2016-10-22 DIAGNOSIS — I428 Other cardiomyopathies: Secondary | ICD-10-CM | POA: Diagnosis not present

## 2016-10-22 DIAGNOSIS — Z125 Encounter for screening for malignant neoplasm of prostate: Secondary | ICD-10-CM | POA: Diagnosis not present

## 2016-10-22 DIAGNOSIS — E7800 Pure hypercholesterolemia, unspecified: Secondary | ICD-10-CM | POA: Diagnosis not present

## 2016-10-22 DIAGNOSIS — R7301 Impaired fasting glucose: Secondary | ICD-10-CM | POA: Diagnosis not present

## 2016-10-22 NOTE — Progress Notes (Signed)
Remote ICD transmission.   

## 2016-10-23 ENCOUNTER — Encounter: Payer: Self-pay | Admitting: Cardiology

## 2016-10-24 DIAGNOSIS — H8113 Benign paroxysmal vertigo, bilateral: Secondary | ICD-10-CM | POA: Diagnosis not present

## 2016-10-24 LAB — CUP PACEART REMOTE DEVICE CHECK
HighPow Impedance: 73 Ohm
Implantable Lead Implant Date: 20150331
Implantable Lead Location: 753860
Lead Channel Impedance Value: 418 Ohm
Lead Channel Pacing Threshold Amplitude: 0.75 V
Lead Channel Sensing Intrinsic Amplitude: 4.5 mV
Lead Channel Setting Pacing Pulse Width: 0.4 ms
Lead Channel Setting Sensing Sensitivity: 0.3 mV
MDC IDC MSMT BATTERY REMAINING LONGEVITY: 119 mo
MDC IDC MSMT BATTERY VOLTAGE: 3.02 V
MDC IDC MSMT LEADCHNL RV IMPEDANCE VALUE: 342 Ohm
MDC IDC MSMT LEADCHNL RV PACING THRESHOLD PULSEWIDTH: 0.4 ms
MDC IDC MSMT LEADCHNL RV SENSING INTR AMPL: 4.5 mV
MDC IDC PG IMPLANT DT: 20150331
MDC IDC SESS DTM: 20180109081802
MDC IDC SET LEADCHNL RV PACING AMPLITUDE: 2.5 V
MDC IDC STAT BRADY RV PERCENT PACED: 0.01 %

## 2016-10-29 DIAGNOSIS — I429 Cardiomyopathy, unspecified: Secondary | ICD-10-CM | POA: Diagnosis not present

## 2016-10-29 DIAGNOSIS — I714 Abdominal aortic aneurysm, without rupture: Secondary | ICD-10-CM | POA: Diagnosis not present

## 2016-10-29 DIAGNOSIS — K862 Cyst of pancreas: Secondary | ICD-10-CM | POA: Diagnosis not present

## 2016-10-29 DIAGNOSIS — R7301 Impaired fasting glucose: Secondary | ICD-10-CM | POA: Diagnosis not present

## 2016-10-29 DIAGNOSIS — C61 Malignant neoplasm of prostate: Secondary | ICD-10-CM | POA: Diagnosis not present

## 2016-10-29 DIAGNOSIS — H6123 Impacted cerumen, bilateral: Secondary | ICD-10-CM | POA: Diagnosis not present

## 2016-10-29 DIAGNOSIS — E668 Other obesity: Secondary | ICD-10-CM | POA: Diagnosis not present

## 2016-10-29 DIAGNOSIS — Z0000 Encounter for general adult medical examination without abnormal findings: Secondary | ICD-10-CM | POA: Diagnosis not present

## 2016-10-29 DIAGNOSIS — Z6835 Body mass index (BMI) 35.0-35.9, adult: Secondary | ICD-10-CM | POA: Diagnosis not present

## 2016-10-29 DIAGNOSIS — D692 Other nonthrombocytopenic purpura: Secondary | ICD-10-CM | POA: Diagnosis not present

## 2016-10-29 DIAGNOSIS — Z1389 Encounter for screening for other disorder: Secondary | ICD-10-CM | POA: Diagnosis not present

## 2016-10-29 DIAGNOSIS — I472 Ventricular tachycardia: Secondary | ICD-10-CM | POA: Diagnosis not present

## 2016-11-05 DIAGNOSIS — H2512 Age-related nuclear cataract, left eye: Secondary | ICD-10-CM | POA: Diagnosis not present

## 2016-11-05 DIAGNOSIS — H353132 Nonexudative age-related macular degeneration, bilateral, intermediate dry stage: Secondary | ICD-10-CM | POA: Diagnosis not present

## 2016-11-05 DIAGNOSIS — H52203 Unspecified astigmatism, bilateral: Secondary | ICD-10-CM | POA: Diagnosis not present

## 2016-11-19 ENCOUNTER — Other Ambulatory Visit: Payer: Self-pay | Admitting: Cardiovascular Disease

## 2016-11-21 DIAGNOSIS — H2512 Age-related nuclear cataract, left eye: Secondary | ICD-10-CM | POA: Diagnosis not present

## 2016-11-21 DIAGNOSIS — H25812 Combined forms of age-related cataract, left eye: Secondary | ICD-10-CM | POA: Diagnosis not present

## 2016-12-23 ENCOUNTER — Other Ambulatory Visit: Payer: Self-pay | Admitting: Cardiovascular Disease

## 2017-01-20 DIAGNOSIS — Z5181 Encounter for therapeutic drug level monitoring: Secondary | ICD-10-CM | POA: Insufficient documentation

## 2017-01-20 DIAGNOSIS — Z79899 Other long term (current) drug therapy: Secondary | ICD-10-CM

## 2017-01-20 NOTE — Progress Notes (Signed)
Patient ID: William [name], male   DOB: [birth year], 83 y.o.   MRN: [id number]    Cardiology Office Note    Date:  01/21/2017   ID:  William [name], DOB [birth year], MRN [id number]  PCP:  Haywood Pao, MD  Cardiologist:   Sanda Klein, MD   Chief Complaint  Patient presents with  . Follow-up    History of Present Illness:  William [name] is a 83 y.o. male with recurrent nonsustained and sustained ventricular tachycardia in the setting of mild nonischemic cardiomyopathy without overt congestive heart failure. Attempts to wean amiodarone therapy have lead to recurrent ventricular arrhythmia, but he responds very well to the antiarrhythmic when it is restarted. 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 He has a small infrarenal abdominal aortic aneurysm, 4 cm by duplex ultrasonography. He underwent a dual-chamber defibrillator generator change and placement of a new defibrillator lead (old lead was Sprint fidelis under advisory) in 2015. He had successful termination of sustained VT with antitachycardia pacing in February 2016, but has not required device shocks.  He continues to be relatively inactive and has been very afraid of falls since he had his spinal fracture. Nevertheless he still mobile roughly 1.6 hours/day, a fairly constant amount over the last 12 months. Defibrillator interrogation shows no episodes of sustained tachycardia that required intervention. He had an 8 beat episode of nonsustained VT on February 2, asymptomatic. This is the only episode of ventricular arrhythmia since his last device check. He never requires ventricular pacing. There has been no atrial fibrillation. Battery longevity is 9.6 years. His optivol shows no signs of fluid overload.  The patient specifically denies any chest pain at rest exertion, dyspnea at rest or with exertion, orthopnea, paroxysmal nocturnal dyspnea, syncope, palpitations, focal neurological deficits, intermittent  claudication, lower extremity edema, unexplained weight gain, cough, hemoptysis or wheezing.  Defibrillator interrogation shows normal device function. Generator reports the mated 10 years of life. He does not require ventricular pacing. He has only had 2 episodes of nonsustained ventricular tachycardia consisting of 5-6 beats, asymptomatic.  Past Medical History:  Diagnosis Date  . AAA (abdominal aortic aneurysm) ([place])    7/13 3.8cm  . Arthritis    "joints" (01/11/2014)  . Asthma    "seasonal; some foods"   . Automatic implantable cardioverter-defibrillator in situ   . CHF (congestive heart failure) ([place])   . Dyslipidemia   . Dysrhythmia   . Hemorrhage    Post-colonostomy  . Hernia   . High cholesterol   . Lumbar vertebral fracture (HCC)    L1- 04/11/2014   . Prostate cancer ([place])   . S/P ICD (internal cardiac defibrillator) procedure, 01/11/14 removal of ERI gen and placement of Medtronic Evera XT VR & NEW Right ventricular lead Medtronic 01/12/2014  . Shortness of breath    with exerton   . Sleep apnea    "lost 60# & don't have it anymore" (01/11/2014)  . Systolic dysfunction, left ventricle   . Ventricular tachycardia [facility])     Past Surgical History:  Procedure Laterality Date  . CARDIAC CATHETERIZATION  08/20/2004   noncritical CAD,mild global hypokinesis, EF 50%  . CARDIAC DEFIBRILLATOR PLACEMENT  08/23/2004   Medtronic  . CATARACT EXTRACTION W/ INTRAOCULAR LENS IMPLANT Right   . COLECTOMY  1990's  . CRYOABLATION N/A 02/24/2014   Procedure: CRYO ABLATION PROSTATE;  Surgeon: Ailene Rud, MD;  [facility];  Service: Urology;  Laterality: N/A;  . HERNIA REPAIR     "  abdomen; from colon OR"  . ICD GENERATOR CHANGE  01/11/2014   "upgrade"  . IMPLANTABLE CARDIOVERTER DEFIBRILLATOR (ICD) GENERATOR CHANGE N/A 01/11/2014   Procedure: ICD GENERATOR CHANGE;  Surgeon: Sanda Klein, MD;  Location: [facility] CATH LAB;  Service: Cardiovascular;  Laterality: N/A;  . LEAD REVISION  N/A 01/11/2014   Procedure: LEAD REVISION;  Surgeon: Sanda Klein, MD;  Location: [facility] CATH LAB;  Service: Cardiovascular;  Laterality: N/A;  . NM MYOCAR PERF WALL MOTION  01/29/2012   abnormal c/o infarct/scar,no ischemia present  . PROSTATE BIOPSY N/A 11/22/2013   Procedure: PROSTATE BIOPSY AND ULTRASOUND;  Surgeon: Ailene Rud, MD;  [facility]: [facility];  Service: Urology;  Laterality: N/A;    Current Medications: Outpatient Medications Prior to Visit  Medication Sig Dispense Refill  . acetaminophen (TYLENOL) 500 MG tablet Take 500 mg by mouth every 6 (six) hours as needed (for pain).     Marland Kitchen amiodarone (PACERONE) 200 MG tablet TAKE 1/2 TABLET DAILY. 45 tablet 7  . ezetimibe-simvastatin (VYTORIN) 10-20 MG per tablet Take 1 tablet by mouth at bedtime.    Marland Kitchen loratadine (CLARITIN) 10 MG tablet Take 10 mg by mouth daily as needed for allergies.     . metoprolol tartrate (LOPRESSOR) 25 MG tablet TAKE 2 TABLETS BY MOUTH TWICE DAILY 360 tablet 1  . polyethylene glycol (MIRALAX / GLYCOLAX) packet Take 17 g by mouth every evening.     No facility-administered medications prior to visit.      Allergies:   Crestor [rosuvastatin] and Lipitor [atorvastatin]   Social History   Social History  . Marital status: Married    Spouse name: N/A  . Number of children: N/A  . Years of education: N/A   Social History Main Topics  . Smoking status: Never Smoker  . Smokeless tobacco: Never Used  . Alcohol use 0.0 oz/week     Comment: 01/11/2014 "drink 1/2 of a beer twice per month   . Drug use: No  . Sexual activity: No   Other Topics Concern  . None   Social History Narrative  . None     Family History:  The patient's family history includes Heart failure in his father and mother.   ROS:   Please see the history of present illness.    ROS All other systems reviewed and are negative.   PHYSICAL EXAM:   VS:  BP 111/65 (BP Location: Left Arm, Patient Position: Sitting)   Pulse 61   Ht 5'  8" (1.727 m)   Wt 108 kg (238 lb)   BMI 36.19 kg/m    GEN: Well nourished, well developed, in no acute distress  HEENT: normal  Neck: no JVD, carotid bruits, or masses Cardiac: Widely split second heart sound , RRR alternating with bigeminal rhythm; no murmurs, rubs, or gallops,no edema , healthy defibrillator site Respiratory:  clear to auscultation bilaterally, normal work of breathing GI: soft, nontender, nondistended, + BS MS: no deformity or atrophy  Skin: warm and dry, no rash Neuro:  Alert and Oriented x 3, Strength and sensation are intact Psych: euthymic mood, full affect  Wt Readings from Last 3 Encounters:  01/21/17 108 kg (238 lb)  07/23/16 108.7 kg (239 lb 9.6 oz)  03/05/16 107.7 kg (237 lb 6.4 oz)      Studies/Labs Reviewed:   EKG:  EKG is ordered today.  The ekg ordered today demonstrates Sinus rhythm with first-degree A-V block (PR 250 ms), right bundle branch block and left anterior fascicular  block, QRS 136 ms, QTC 465 ms  Recent Labs: 04/25/2016: ALT 12; BUN 18; Creat 1.19; Potassium 4.4; Sodium 136; TSH 2.90  January 2017 lipid profile total cholesterol 118, triglycerides 111, HDL 28, LDL 68  ASSESSMENT:    1. Sustained VT (ventricular tachycardia) ([place])   2. Other cardiomyopathy ([place])   3. Encounter for monitoring amiodarone therapy   4. Abdominal aortic aneurysm (AAA) without rupture ([place])   5. S/P ICD (internal cardiac defibrillator) procedure, 01/11/14 removal of ERI gen and placement of Medtronic Evera XT VR & NEW Right ventricular lead Medtronic   6. Atherosclerosis of native coronary artery of native heart without angina pectoris   7. Other hyperlipidemia      PLAN:  In order of problems listed above:  1. VT: Asymptomatic, brief and very infrequent episodes of nonsustained VT since his last device check. He responds very well to very low doses of amiodarone 2. CMP: Mildly depressed left ventricular ejection fraction without overt congestive heart  failure by symptoms or physical exam 3. Amiodarone: Responds very well to even very low doses of this drug, but attempts to wean it off of lead to recurrent VT that led to defibrillator therapies. Check liver function tests and thyroid studies today. Reminded him about that need to avoid sun exposure and need for yearly ophthalmological exams. He should report pulmonary symptoms promptly 4. AAA: stable at 4 cm. Duplex ultrasound scheduled for April 30. It remains asymptomatic. 5. ICD: Normal device function. Continue remote downloads every 3 months and office visits every 6 months 6. CAD: He has not experienced angina pectoris and has not had critical lesions by angiography 7. HLP: At target LDL less than 70. Potential adverse interaction between amiodarone and simvastatin noted. He has tolerated this combination even when he was on higher doses of amiodarone. Get most recent lipid profile from primary care provider.   Medication Adjustments/Labs and Tests Ordered: Current medicines are reviewed at length with the patient today.  Concerns regarding medicines are outlined above.  Medication changes, Labs and Tests ordered today are listed in the Patient Instructions below. Patient Instructions  Dr Sallyanne Kuster recommends that you continue on your current medications as directed. Please refer to the Current Medication list given to you today.  Your physician recommends that you return for lab work at your earliest convenience.  Remote monitoring is used to monitor your Pacemaker of ICD from home. This monitoring reduces the number of office visits required to check your device to one time per year. It allows [place] to keep an eye on the functioning of your device to ensure it is working properly. You are scheduled for a device check from home on Tuesday, July 10th, 2018. You may send your transmission at any time that day. If you have a wireless device, the transmission will be sent automatically. After your  physician reviews your transmission, you will receive a postcard with your next transmission date.  Dr Sallyanne Kuster recommends that you schedule a follow-up appointment in 6 months with a defibrillator check. You will receive a reminder letter in the mail two months in advance. If you don't receive a letter, please call our office to schedule the follow-up appointment.  If you need a refill on your cardiac medications before your next appointment, please call your pharmacy.    Signed, Sanda Klein, MD  01/21/2017 12:23 PM    [address], [place], [place]  [address] Phone: [phone]; Fax: [phone]

## 2017-01-21 ENCOUNTER — Ambulatory Visit (INDEPENDENT_AMBULATORY_CARE_PROVIDER_SITE_OTHER): Payer: Medicare Other | Admitting: Cardiovascular Disease

## 2017-01-21 ENCOUNTER — Encounter: Payer: Self-pay | Admitting: Cardiovascular Disease

## 2017-01-21 VITALS — BP 111/65 | HR 61 | Ht 68.0 in | Wt 238.0 lb

## 2017-01-21 DIAGNOSIS — I472 Ventricular tachycardia, unspecified: Secondary | ICD-10-CM

## 2017-01-21 DIAGNOSIS — Z79899 Other long term (current) drug therapy: Secondary | ICD-10-CM | POA: Diagnosis not present

## 2017-01-21 DIAGNOSIS — E784 Other hyperlipidemia: Secondary | ICD-10-CM | POA: Diagnosis not present

## 2017-01-21 DIAGNOSIS — Z95810 Presence of automatic (implantable) cardiac defibrillator: Secondary | ICD-10-CM | POA: Diagnosis not present

## 2017-01-21 DIAGNOSIS — I2510 Atherosclerotic heart disease of native coronary artery without angina pectoris: Secondary | ICD-10-CM | POA: Diagnosis not present

## 2017-01-21 DIAGNOSIS — I714 Abdominal aortic aneurysm, without rupture, unspecified: Secondary | ICD-10-CM

## 2017-01-21 DIAGNOSIS — E7849 Other hyperlipidemia: Secondary | ICD-10-CM

## 2017-01-21 DIAGNOSIS — Z5181 Encounter for therapeutic drug level monitoring: Secondary | ICD-10-CM

## 2017-01-21 DIAGNOSIS — I428 Other cardiomyopathies: Secondary | ICD-10-CM | POA: Diagnosis not present

## 2017-01-21 MED ORDER — POLYETHYLENE GLYCOL 3350 17 G PO PACK: 17.0000 g | PACK | Freq: Every evening | ORAL | 1 refills | Status: DC

## 2017-01-21 NOTE — Patient Instructions (Signed)
Dr Sallyanne Kuster recommends that you continue on your current medications as directed. Please refer to the Current Medication list given to you today.  Your physician recommends that you return for lab work at your earliest convenience.  Remote monitoring is used to monitor your Pacemaker of ICD from home. This monitoring reduces the number of office visits required to check your device to one time per year. It allows [place] to keep an eye on the functioning of your device to ensure it is working properly. You are scheduled for a device check from home on Tuesday, July 10th, 2018. You may send your transmission at any time that day. If you have a wireless device, the transmission will be sent automatically. After your physician reviews your transmission, you will receive a postcard with your next transmission date.  Dr Sallyanne Kuster recommends that you schedule a follow-up appointment in 6 months with a defibrillator check. You will receive a reminder letter in the mail two months in advance. If you don't receive a letter, please call our office to schedule the follow-up appointment.  If you need a refill on your cardiac medications before your next appointment, please call your pharmacy.

## 2017-02-05 DIAGNOSIS — Z79899 Other long term (current) drug therapy: Secondary | ICD-10-CM | POA: Diagnosis not present

## 2017-02-05 DIAGNOSIS — I472 Ventricular tachycardia: Secondary | ICD-10-CM | POA: Diagnosis not present

## 2017-02-05 DIAGNOSIS — Z5181 Encounter for therapeutic drug level monitoring: Secondary | ICD-10-CM | POA: Diagnosis not present

## 2017-02-05 LAB — CUP PACEART INCLINIC DEVICE CHECK
Date Time Interrogation Session: 20180410144639
HighPow Impedance: 72 Ohm
Implantable Lead Implant Date: 20150331
Implantable Lead Location: 753860
Lead Channel Pacing Threshold Pulse Width: 0.4 ms
Lead Channel Sensing Intrinsic Amplitude: 4.25 mV
MDC IDC MSMT BATTERY REMAINING LONGEVITY: 116 mo
MDC IDC MSMT BATTERY VOLTAGE: 3.02 V
MDC IDC MSMT LEADCHNL RV IMPEDANCE VALUE: 342 Ohm
MDC IDC MSMT LEADCHNL RV IMPEDANCE VALUE: 456 Ohm
MDC IDC MSMT LEADCHNL RV PACING THRESHOLD AMPLITUDE: 0.875 V
MDC IDC MSMT LEADCHNL RV SENSING INTR AMPL: 4.25 mV
MDC IDC PG IMPLANT DT: 20150331
MDC IDC SET LEADCHNL RV PACING AMPLITUDE: 2.5 V
MDC IDC SET LEADCHNL RV PACING PULSEWIDTH: 0.4 ms
MDC IDC SET LEADCHNL RV SENSING SENSITIVITY: 0.3 mV
MDC IDC STAT BRADY RV PERCENT PACED: 0.01 %

## 2017-02-05 LAB — COMPREHENSIVE METABOLIC PANEL
ALBUMIN: 3.5 g/dL — AB (ref 3.6–5.1)
ALK PHOS: 88 U/L (ref 40–115)
ALT: 11 U/L (ref 9–46)
AST: 17 U/L (ref 10–35)
BILIRUBIN TOTAL: 1.2 mg/dL (ref 0.2–1.2)
BUN: 16 mg/dL (ref 7–25)
CO2: 23 mmol/L (ref 20–31)
CREATININE: 1.12 mg/dL — AB (ref 0.70–1.11)
Calcium: 8.8 mg/dL (ref 8.6–10.3)
Chloride: 101 mmol/L (ref 98–110)
GLUCOSE: 175 mg/dL — AB (ref 65–99)
Potassium: 4.3 mmol/L (ref 3.5–5.3)
Sodium: 136 mmol/L (ref 135–146)
TOTAL PROTEIN: 6.4 g/dL (ref 6.1–8.1)

## 2017-02-05 LAB — TSH: TSH: 1.75 m[IU]/L (ref 0.40–4.50)

## 2017-02-07 ENCOUNTER — Other Ambulatory Visit: Payer: Self-pay | Admitting: Cardiovascular Disease

## 2017-02-07 DIAGNOSIS — I714 Abdominal aortic aneurysm, without rupture, unspecified: Secondary | ICD-10-CM

## 2017-02-10 ENCOUNTER — Ambulatory Visit (HOSPITAL_COMMUNITY)
Admission: RE | Admit: 2017-02-10 | Discharge: 2017-02-10 | Disposition: A | Discharge status: Home / Self Care | Payer: Medicare Other | Source: Ambulatory Visit | Attending: Internal Medicine | Admitting: Internal Medicine

## 2017-02-10 DIAGNOSIS — I708 Atherosclerosis of other arteries: Secondary | ICD-10-CM | POA: Insufficient documentation

## 2017-02-10 DIAGNOSIS — I714 Abdominal aortic aneurysm, without rupture, unspecified: Secondary | ICD-10-CM

## 2017-02-14 ENCOUNTER — Telehealth: Payer: Self-pay | Admitting: Cardiovascular Disease

## 2017-02-14 NOTE — Telephone Encounter (Signed)
New Message     Lab from 10/28/16 was 99 fasting  And the last set of lab work patient was not fasting

## 2017-02-14 NOTE — Telephone Encounter (Signed)
Acknowledged.  Thank you.

## 2017-02-14 NOTE — Telephone Encounter (Signed)
Got it. Should be repeated when fasting, but let PCP handle that., please MCr

## 2017-02-14 NOTE — Telephone Encounter (Signed)
Patient's wife called to state that his last blood sugar from his lab work was 179. She had told the nurse that he was fasting for that lab but called back to state that he had not been fasting like she thought. She wanted the nurse to know. Will route to her for her information.

## 2017-03-06 ENCOUNTER — Telehealth: Payer: Self-pay | Admitting: Cardiovascular Disease

## 2017-03-06 NOTE — Telephone Encounter (Signed)
New Message  Pt wife call requesting to speak with RN. Pt wife states the moved the home device to a downstairs room ad would like to do a test transmission to make sure the machine is working correctly. Please call back to discuss

## 2017-03-06 NOTE — Telephone Encounter (Signed)
Spoke to wife about monitor. She asked if a test transmission could be scheduled to see if it's going to work in the new location. Transmission scheduled for 5/28. Plan to call wife on Tuesday to let her know if it was received. Wife verbalized understanding.

## 2017-03-06 NOTE — Telephone Encounter (Signed)
Attempted call x 2-busy/sss

## 2017-03-11 ENCOUNTER — Other Ambulatory Visit (INDEPENDENT_AMBULATORY_CARE_PROVIDER_SITE_OTHER): Payer: Self-pay | Admitting: Specialist

## 2017-03-11 NOTE — Telephone Encounter (Signed)
Transmission received. Mrs. Varnum aware.

## 2017-03-11 NOTE — Telephone Encounter (Signed)
Polyeth Glycol refill request, last OV was 07/14/2013

## 2017-04-07 ENCOUNTER — Telehealth: Payer: Self-pay | Admitting: Cardiovascular Disease

## 2017-04-07 NOTE — Telephone Encounter (Signed)
Spoke w/wife, anne she states that pt's RLE has been swelling for the last couple days, and pt has varicose veins, she has her compression stockings and she does not use them currently and she states that will elevate LE and also she will put compression stockings on him tomorrow and call us back with the result and if anything else is needed. Pt denies any other sx SOB, CP or pressure, etc... She states that if pt sx worsens she will take pt to the ER

## 2017-04-07 NOTE — Telephone Encounter (Signed)
New message     Pt c/o swelling: STAT is pt has developed SOB within 24 hours  1. How long have you been experiencing swelling?  1 or 2 days   2. Where is the swelling located?  Rt foot and leg  3.  Are you currently taking a "fluid pill"? no  4.  Are you currently SOB? no  5.  Have you traveled recently? No , but he has been sitting a lot lately

## 2017-04-22 ENCOUNTER — Ambulatory Visit (INDEPENDENT_AMBULATORY_CARE_PROVIDER_SITE_OTHER): Payer: Medicare Other | Admitting: *Deleted

## 2017-04-22 ENCOUNTER — Telehealth: Payer: Self-pay | Admitting: Cardiology

## 2017-04-22 DIAGNOSIS — I428 Other cardiomyopathies: Secondary | ICD-10-CM

## 2017-04-22 NOTE — Progress Notes (Signed)
Remote ICD transmission.   

## 2017-04-22 NOTE — Telephone Encounter (Signed)
Confirmed remote transmission w/ pt wife.   

## 2017-04-24 LAB — CUP PACEART REMOTE DEVICE CHECK
Battery Remaining Longevity: 112 mo
HighPow Impedance: 72 Ohm
Implantable Lead Implant Date: 20150331
Implantable Lead Location: 753860
Implantable Pulse Generator Implant Date: 20150331
Lead Channel Impedance Value: 399 Ohm
Lead Channel Pacing Threshold Amplitude: 0.875 V
Lead Channel Pacing Threshold Pulse Width: 0.4 ms
Lead Channel Setting Pacing Pulse Width: 0.4 ms
Lead Channel Setting Sensing Sensitivity: 0.3 mV
MDC IDC MSMT BATTERY VOLTAGE: 3.01 V
MDC IDC MSMT LEADCHNL RV IMPEDANCE VALUE: 475 Ohm
MDC IDC MSMT LEADCHNL RV SENSING INTR AMPL: 4.75 mV
MDC IDC MSMT LEADCHNL RV SENSING INTR AMPL: 4.75 mV
MDC IDC SESS DTM: 20180710164531
MDC IDC SET LEADCHNL RV PACING AMPLITUDE: 2.5 V
MDC IDC STAT BRADY RV PERCENT PACED: 0.05 %

## 2017-04-28 ENCOUNTER — Encounter: Payer: Self-pay | Admitting: Cardiology

## 2017-05-10 ENCOUNTER — Other Ambulatory Visit: Payer: Self-pay | Admitting: Cardiovascular Disease

## 2017-05-15 DIAGNOSIS — Z95810 Presence of automatic (implantable) cardiac defibrillator: Secondary | ICD-10-CM | POA: Diagnosis not present

## 2017-05-15 DIAGNOSIS — G4733 Obstructive sleep apnea (adult) (pediatric): Secondary | ICD-10-CM | POA: Diagnosis not present

## 2017-05-15 DIAGNOSIS — I480 Paroxysmal atrial fibrillation: Secondary | ICD-10-CM | POA: Diagnosis not present

## 2017-05-15 DIAGNOSIS — E668 Other obesity: Secondary | ICD-10-CM | POA: Diagnosis not present

## 2017-05-15 DIAGNOSIS — E7800 Pure hypercholesterolemia, unspecified: Secondary | ICD-10-CM | POA: Diagnosis not present

## 2017-05-15 DIAGNOSIS — D692 Other nonthrombocytopenic purpura: Secondary | ICD-10-CM | POA: Diagnosis not present

## 2017-05-15 DIAGNOSIS — I714 Abdominal aortic aneurysm, without rupture: Secondary | ICD-10-CM | POA: Diagnosis not present

## 2017-05-15 DIAGNOSIS — R7301 Impaired fasting glucose: Secondary | ICD-10-CM | POA: Diagnosis not present

## 2017-05-15 DIAGNOSIS — Z6832 Body mass index (BMI) 32.0-32.9, adult: Secondary | ICD-10-CM | POA: Diagnosis not present

## 2017-05-15 DIAGNOSIS — I428 Other cardiomyopathies: Secondary | ICD-10-CM | POA: Diagnosis not present

## 2017-05-15 DIAGNOSIS — C61 Malignant neoplasm of prostate: Secondary | ICD-10-CM | POA: Diagnosis not present

## 2017-05-16 ENCOUNTER — Telehealth: Payer: Self-pay | Admitting: Cardiovascular Disease

## 2017-05-16 MED ORDER — POTASSIUM CHLORIDE ER 10 MEQ PO TBCR: EXTENDED_RELEASE_TABLET | ORAL | 1 refills | Status: DC

## 2017-05-16 MED ORDER — FUROSEMIDE 20 MG PO TABS: ORAL_TABLET | ORAL | 1 refills | Status: DC

## 2017-05-16 NOTE — Telephone Encounter (Signed)
New Message   Pt c/o swelling: STAT is pt has developed SOB within 24 hours  1. How long have you been experiencing swelling? This past week  2. Where is the swelling located? Right foot swelling  3.  Are you currently taking a "fluid pill"? no  4.  Are you currently SOB? no  5.  Have you traveled recently? Yes went to the beach

## 2017-05-16 NOTE — Telephone Encounter (Signed)
Returned call to patient's wife Dr.Croitoru's recommendations given.Advised to call back next week if swelling persists.

## 2017-05-16 NOTE — Telephone Encounter (Signed)
Please take furosemide 20 mg daily and KCl 10 mEq daily for 2-4 days until swelling resolves. Please call next week if swelling persists. (#30 of each, 1 refill) MCr

## 2017-05-16 NOTE — Telephone Encounter (Signed)
Returned call to patient's wife.She stated husband has swelling in both feet.Stated they went to the beach this past week, but he noticed swelling before their trip.Stated his weight is stable,no sob.He has not ate salty foods.Stated she wanted to ask Dr.Croitoru if he would prescribe lasix to take if needed.Message sent to Dr.Croitoru for advice.

## 2017-06-02 NOTE — Telephone Encounter (Signed)
Spoke to wife,  - wanted to inform DR C.- PATIENT SWELLING INS STILL PRESENT  RIGHT LEG  VERUS LEFT. Swelling does not occur everyday.  The use of the lasix last week helped she states they used medication for 2- 4 days.   wife states that she gave patient dose of lasix today.  Wife states that they are planning to go out of time tomorrow.  Wife aware that  Will defer to Dr Sallyanne Kuster

## 2017-06-02 NOTE — Telephone Encounter (Signed)
New Message  Pt wife call requesting to speak with RN. She states pt was prescribed lasix and does not feel it is working. Please call back to discuss

## 2017-06-03 NOTE — Telephone Encounter (Signed)
The effects of the furosemide are very short-lived and the dose that he is receiving is very small. I would advise trying furosemide 40 mg (2 tablets) 4 days a week (for example Tuesday Thursday Saturday Sunday). Then see how that works. MCr

## 2017-06-03 NOTE — Telephone Encounter (Signed)
LEFT MESSAGE ON HOME PHONE TO CALL BACK, LEFT DETAIL INSTRUCTION ON SECURE VOICEMAIL PER WIFE  INSTRUCTIONS IF NOT ABLE TO [place] PHONE

## 2017-07-22 ENCOUNTER — Encounter: Payer: Medicare Other | Admitting: *Deleted

## 2017-07-22 ENCOUNTER — Telehealth: Payer: Self-pay | Admitting: Cardiology

## 2017-07-22 NOTE — Telephone Encounter (Signed)
Confirmed remote transmission w/ pt wife.   

## 2017-07-25 ENCOUNTER — Encounter: Payer: Self-pay | Admitting: Cardiology

## 2017-07-29 ENCOUNTER — Telehealth: Payer: Self-pay | Admitting: Cardiovascular Disease

## 2017-07-29 ENCOUNTER — Ambulatory Visit (INDEPENDENT_AMBULATORY_CARE_PROVIDER_SITE_OTHER): Payer: Medicare Other | Admitting: *Deleted

## 2017-07-29 DIAGNOSIS — I428 Other cardiomyopathies: Secondary | ICD-10-CM

## 2017-07-29 NOTE — Progress Notes (Signed)
Remote ICD transmission.   

## 2017-07-29 NOTE — Telephone Encounter (Signed)
Informed wife that I would send patient a return kit. Confirmed address with wife. Return ordered from [employer] website. I did inform wife that the return kits have been on back order and it may take a few weeks to get to her. Wife verbalized understanding. 

## 2017-07-29 NOTE — Telephone Encounter (Signed)
New Message  Pt wife call requesting to speak with RN about sending pt old device back to the company. She states she has been trying to call the company for proper instructions on sending the machine back, but has not spoken with anyone. Pt would like to speak with someone for help. Please call back to discuss

## 2017-07-31 LAB — CUP PACEART REMOTE DEVICE CHECK
Battery Remaining Longevity: 110 mo
Date Time Interrogation Session: 20181016132427
HighPow Impedance: 73 Ohm
Implantable Lead Implant Date: 20150331
Implantable Lead Location: 753860
Lead Channel Impedance Value: 399 Ohm
Lead Channel Pacing Threshold Amplitude: 0.875 V
Lead Channel Sensing Intrinsic Amplitude: 4.875 mV
Lead Channel Setting Sensing Sensitivity: 0.3 mV
MDC IDC MSMT BATTERY VOLTAGE: 3.01 V
MDC IDC MSMT LEADCHNL RV IMPEDANCE VALUE: 475 Ohm
MDC IDC MSMT LEADCHNL RV PACING THRESHOLD PULSEWIDTH: 0.4 ms
MDC IDC MSMT LEADCHNL RV SENSING INTR AMPL: 4.875 mV
MDC IDC PG IMPLANT DT: 20150331
MDC IDC SET LEADCHNL RV PACING AMPLITUDE: 2.5 V
MDC IDC SET LEADCHNL RV PACING PULSEWIDTH: 0.4 ms
MDC IDC STAT BRADY RV PERCENT PACED: 0.02 %

## 2017-08-01 ENCOUNTER — Encounter: Payer: Self-pay | Admitting: Cardiology

## 2017-08-04 ENCOUNTER — Other Ambulatory Visit: Payer: Self-pay | Admitting: *Deleted

## 2017-08-04 DIAGNOSIS — I714 Abdominal aortic aneurysm, without rupture, unspecified: Secondary | ICD-10-CM

## 2017-08-13 ENCOUNTER — Encounter: Payer: Medicare Other | Admitting: Cardiovascular Disease

## 2017-08-13 DIAGNOSIS — Z23 Encounter for immunization: Secondary | ICD-10-CM | POA: Diagnosis not present

## 2017-09-01 ENCOUNTER — Ambulatory Visit (INDEPENDENT_AMBULATORY_CARE_PROVIDER_SITE_OTHER): Payer: Medicare Other | Admitting: Cardiovascular Disease

## 2017-09-01 ENCOUNTER — Encounter: Payer: Self-pay | Admitting: Cardiovascular Disease

## 2017-09-01 VITALS — BP 122/76 | HR 76 | Ht 70.0 in | Wt 245.0 lb

## 2017-09-01 DIAGNOSIS — I714 Abdominal aortic aneurysm, without rupture, unspecified: Secondary | ICD-10-CM

## 2017-09-01 DIAGNOSIS — E7800 Pure hypercholesterolemia, unspecified: Secondary | ICD-10-CM | POA: Diagnosis not present

## 2017-09-01 DIAGNOSIS — I428 Other cardiomyopathies: Secondary | ICD-10-CM | POA: Diagnosis not present

## 2017-09-01 DIAGNOSIS — I472 Ventricular tachycardia, unspecified: Secondary | ICD-10-CM

## 2017-09-01 DIAGNOSIS — Z79899 Other long term (current) drug therapy: Secondary | ICD-10-CM

## 2017-09-01 DIAGNOSIS — Z5181 Encounter for therapeutic drug level monitoring: Secondary | ICD-10-CM | POA: Diagnosis not present

## 2017-09-01 DIAGNOSIS — I25810 Atherosclerosis of coronary artery bypass graft(s) without angina pectoris: Secondary | ICD-10-CM

## 2017-09-01 DIAGNOSIS — Z95810 Presence of automatic (implantable) cardiac defibrillator: Secondary | ICD-10-CM

## 2017-09-01 MED ORDER — METOPROLOL SUCCINATE ER 50 MG PO TB24: 50.0000 mg | ORAL_TABLET | Freq: Every day | ORAL | 3 refills | Status: DC

## 2017-09-01 NOTE — Progress Notes (Signed)
Patient ID: William [name], male   DOB: 2/95/1882, 83 y.o.   MRN: [id number]    Cardiology Office Note    Date:  09/01/2017   ID:  William [name], DOB [birth year], MRN [id number]  PCP:  Haywood Pao, MD  Cardiologist:   Sanda Klein, MD   Chief Complaint  Patient presents with  . Follow-up    History of Present Illness:  William [name] is a 83 y.o. male with recurrent nonsustained and sustained ventricular tachycardia in the setting of mild nonischemic cardiomyopathy without overt congestive heart failure. Attempts to wean amiodarone therapy have lead to recurrent ventricular arrhythmia, but he responds very well to the antiarrhythmic when it is restarted. 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 He has a small infrarenal abdominal aortic aneurysm, 4 cm by duplex ultrasonography. He underwent a dual-chamber defibrillator generator change and placement of a new defibrillator lead (old lead was Sprint fidelis under advisory) in 2015. He had successful termination of sustained VT with antitachycardia pacing in February 2016, but has not required device shocks.  There have been no significant cardiac events since Isamu's last appointment.  He has developed some mild orthostatic dizziness with rapid changes in position.  The biggest change is further deterioration in his short-term memory.  This was quite apparent during the appointment today, when he repeated the same statement 3-4 times.  He denies chest pain or dyspnea, but is quite sedentary due to his back problems.  Fortunately, he has not had any new falls.  He has not had syncope and is not aware of palpitations.  He has chronic leg edema, particularly prominent on the left side.  He tries to keep his legs elevated as much as possible and to wear his compression stockings.  He continues to be relatively inactive and has been very afraid of falls since he had his spinal fracture. Nevertheless he still mobile  roughly 1.6 hours/day, a fairly constant amount over the last 12 months. Defibrillator interrogation shows no episodes of sustained tachycardia that required intervention. He had an 8 beat episode of nonsustained VT on February 2, asymptomatic. This is the only episode of ventricular arrhythmia since his last device check. He never requires ventricular pacing. There has been no atrial fibrillation. Battery longevity is 9.6 years. His optivol shows no signs of fluid overload.  Defibrillator shows normal device function, with anticipated generator longevity of greater than 9 years he never requires ventricular pacing.  There have been 5 episodes of true nonsustained ventricular tachycardia during the last 6 months, all of them are brief lasting at most 1 second.  During the whole day of March 27, 2017 he had recurrent episodes of supraventricular tachycardia, possibly sinus tachycardia, usually discriminated properly as SVT.  At times the arrhythmia was categorized as VT due to frequent superimposed PVCs/fusion beats.  No treatment was delivered. He does not recall having any cardiac symptoms or other problems around the time. .  Past Medical History:  Diagnosis Date  . AAA (abdominal aortic aneurysm) ([place])    7/13 3.8cm  . Arthritis    "joints" (01/11/2014)  . Asthma    "seasonal; some foods"   . Automatic implantable cardioverter-defibrillator in situ   . CHF (congestive heart failure) ([place])   . Dyslipidemia   . Dysrhythmia   . Hemorrhage    Post-colonostomy  . Hernia   . High cholesterol   . Lumbar vertebral fracture ([facility])    L1- 04/11/2014   . Prostate  cancer ([place])   . S/P ICD (internal cardiac defibrillator) procedure, 01/11/14 removal of ERI gen and placement of Medtronic Evera XT VR & NEW Right ventricular lead Medtronic 01/12/2014  . Shortness of breath    with exerton   . Sleep apnea    "lost 60# & don't have it anymore" (01/11/2014)  . Systolic dysfunction, left ventricle   .  Ventricular tachycardia [facility])     Past Surgical History:  Procedure Laterality Date  . CARDIAC CATHETERIZATION  08/20/2004   noncritical CAD,mild global hypokinesis, EF 50%  . CARDIAC DEFIBRILLATOR PLACEMENT  08/23/2004   Medtronic  . CATARACT EXTRACTION W/ INTRAOCULAR LENS IMPLANT Right   . COLECTOMY  1990's  . CRYO ABLATION PROSTATE N/A 02/24/2014   Performed by Ailene Rud, MD at [facility] ORS  . HERNIA REPAIR     "abdomen; from colon OR"  . ICD GENERATOR CHANGE  01/11/2014   "upgrade"  . ICD GENERATOR CHANGE N/A 01/11/2014   Performed by Sanda Klein, MD at [facility] CATH LAB  . LEAD REVISION N/A 01/11/2014   Performed by Sanda Klein, MD at [facility] CATH LAB  . NM MYOCAR PERF WALL MOTION  01/29/2012   abnormal c/o infarct/scar,no ischemia present  . PROSTATE BIOPSY AND ULTRASOUND N/A 11/22/2013   Performed by Marcia Brash, MD at [facility] ORS    Current Medications: Outpatient Medications Prior to Visit  Medication Sig Dispense Refill  . acetaminophen (TYLENOL) 500 MG tablet Take 500 mg by mouth every 6 (six) hours as needed (for pain).     Marland Kitchen amiodarone (PACERONE) 200 MG tablet TAKE 1/2 TABLET DAILY. 45 tablet 7  . ezetimibe-simvastatin (VYTORIN) 10-20 MG per tablet Take 1 tablet by mouth at bedtime.    Marland Kitchen loratadine (CLARITIN) 10 MG tablet Take 10 mg by mouth daily as needed for allergies.     . polyethylene glycol (MIRALAX / GLYCOLAX) packet Take 17 g by mouth every evening. 14 each 1  . polyethylene glycol powder (GLYCOLAX/MIRALAX) powder MIX 17 GRAMS IN 8 OUNCES OF WATER EVERY DAY. 1581 g 0  . potassium chloride (K-DUR) 10 MEQ tablet TAKE 1 TABLET BY MOUTH DAILY ALONG WITH LASIX FOR 2 TO 4 DAYS THEN STOP 30 tablet 1  . metoprolol tartrate (LOPRESSOR) 25 MG tablet TAKE 2 TABLETS BY MOUTH TWICE DAILY 360 tablet 2  . furosemide (LASIX) 20 MG tablet TAKE 1 TABLET(20 MG) BY MOUTH DAILY FOR 2 TO 4 DAYS FOR SWELLING THEN STOP (Patient not taking: Reported on 09/01/2017) 30 tablet 1   No  facility-administered medications prior to visit.      Allergies:   Crestor [rosuvastatin] and Lipitor [atorvastatin]   Social History   Socioeconomic History  . Marital status: Married    Spouse name: None  . Number of children: None  . Years of education: None  . Highest education level: None  Social Needs  . Financial resource strain: None  . Food insecurity - worry: None  . Food insecurity - inability: None  . Transportation needs - medical: None  . Transportation needs - non-medical: None  Occupational History  . None  Tobacco Use  . Smoking status: Never Smoker  . Smokeless tobacco: Never Used  Substance and Sexual Activity  . Alcohol use: Yes    Alcohol/week: 0.0 oz    Comment: 01/11/2014 "drink 1/2 of a beer twice per month   . Drug use: No  . Sexual activity: No  Other Topics Concern  . None  Social History Narrative  .  None     Family History:  The patient's family history includes Heart failure in his father and mother.   ROS:   Please see the history of present illness.    ROS All other systems reviewed and are negative.   PHYSICAL EXAM:   VS:  BP 122/76   Pulse 76   Ht 5\' 10"  (1.778 m)   Wt 245 lb (111.1 kg)   BMI 35.15 kg/m     General: Alert, oriented x3, no distress, moderately obese Head: no evidence of trauma, PERRL, EOMI, no exophtalmos or lid lag, no myxedema, no xanthelasma; normal ears, nose and oropharynx Neck: normal jugular venous pulsations and no hepatojugular reflux; brisk carotid pulses without delay and no carotid bruits Chest: clear to auscultation, no signs of consolidation by percussion or palpation, normal fremitus, symmetrical and full respiratory excursions.  Health left subclavian defibrillator site Cardiovascular: normal position and quality of the apical impulse, regular rhythm, normal first and widely split second heart sounds, no murmurs, rubs or gallops Abdomen: no tenderness or distention, no masses by palpation, no  abnormal pulsatility or arterial bruits, normal bowel sounds, no hepatosplenomegaly Extremities: no clubbing, cyanosis, 1+ symmetrical ankle and calf edema; 2+ radial, ulnar and brachial pulses bilaterally; 2+ right femoral, posterior tibial and dorsalis pedis pulses; 2+ left femoral, posterior tibial and dorsalis pedis pulses; no subclavian or femoral bruits Neurological: grossly nonfocal Psych: Normal mood and affect   Wt Readings from Last 3 Encounters:  09/01/17 245 lb (111.1 kg)  01/21/17 238 lb (108 kg)  07/23/16 239 lb 9.6 oz (108.7 kg)      Studies/Labs Reviewed:   EKG:  EKG is ordered today.  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  All lipid parameters within desirable range in January 2018, except for chronically low HDL cholesterol  ASSESSMENT:    1. Sustained VT (ventricular tachycardia) ([place])   2. Nonischemic cardiomyopathy ([place])   3. Encounter for monitoring amiodarone therapy   4. AAA (abdominal aortic aneurysm) without rupture ([place])   5. ICD (implantable cardioverter-defibrillator) in place   6. Coronary artery disease involving coronary bypass graft of native heart without angina pectoris   7. Hypercholesterolemia      PLAN:  In order of problems listed above:  1. VT: As before he has occasional brief and asymptomatic episodes of nonsustained VT.  Previous attempts to wean him off amiodarone completely have led to lengthy  episodes of ventricular tachycardia and defibrillator intervention. 2. CMP: He has mildly depressed left ventricular systolic function without overt hypervolemia or shortness of breath.  I do not think he will tolerate RAAS inhibitors with his complaints of orthostatic dizziness.  For the same reason we will decrease his metoprolol to 50 mg a day.  For convenience will change to metoprolol succinate 3. Amiodarone: Recent liver and thyroid tests were normal.  Reminded him about the need to report any new pulmonary symptoms promptly 4. AAA: stable at 4 cm.  Last ultrasound performed on April 30, aneurysm  has had same management for a long time. It remains asymptomatic.  The patient states he will not undergo any type of surgical intervention if we would recommend this.  I do not think we need to keep on monitoring his aneurysm.  His wife seems a little reluctant, and I suggested that she discuss this with their children as well.  Personally, I do not think he will ever require or be a good candidate for either surgical or percutaneous aneurysm repair, should that ever come up. 5. ICD: Remote downloads every 3 months and office visits every 6 months for VT and amiodarone monitoring 6. CAD: He has never expressed angina pectoris.  He only had moderate lesions on angiography in the past. 7. HLP: All lipid parameters at target on current medication except for the chronically low HDL, which is unlikely to improve without substantial weight loss.  Although there is a potential for adverse interaction between simvastatin and amiodarone he has been on this combination for many years, without side effects.  Plan to continue it.   Medication Adjustments/Labs and Tests Ordered: Current medicines are reviewed at length with the patient today.  Concerns regarding medicines are outlined above.  Medication changes, Labs and Tests ordered today are listed in the Patient Instructions below. Patient Instructions  Dr  Sallyanne Kuster has recommended making the following medication changes: 1. STOP Metoprolol TARTRATE 2. START Metoprolol SUCCINATE 50 mg - take 1 tablet by mouth daily  Remote monitoring is used to monitor your Pacemaker of ICD from home. This monitoring reduces the number of office visits required to check your device to one time per year. It allows [place] to keep an eye on the functioning of your device to ensure it is working properly. You are scheduled for a device check from home on Tuesday, January 15th, 2019. You may send your transmission at any time that day. If you have a wireless device, the transmission will be sent automatically. After your physician reviews your transmission, you will receive a postcard with your next transmission date.  Dr Sallyanne Kuster recommends that you schedule a follow-up appointment in 6 months with an ICD check. You will receive a reminder letter in the mail two months in advance. If you don't receive a letter, please call our office to schedule the follow-up appointment.  If you need a refill on your cardiac medications before your next appointment, please call your pharmacy.    Signed, Sanda Klein, MD  09/01/2017 3:34 PM    [address], [place], [place]  [address] Phone: [phone]; Fax: [phone]

## 2017-09-01 NOTE — Patient Instructions (Signed)
Dr Sallyanne Kuster has recommended making the following medication changes: 1. STOP Metoprolol TARTRATE 2. START Metoprolol SUCCINATE 50 mg - take 1 tablet by mouth daily  Remote monitoring is used to monitor your Pacemaker of ICD from home. This monitoring reduces the number of office visits required to check your device to one time per year. It allows [place] to keep an eye on the functioning of your device to ensure it is working properly. You are scheduled for a device check from home on Tuesday, January 15th, 2019. You may send your transmission at any time that day. If you have a wireless device, the transmission will be sent automatically. After your physician reviews your transmission, you will receive a postcard with your next transmission date.  Dr Sallyanne Kuster recommends that you schedule a follow-up appointment in 6 months with an ICD check. You will receive a reminder letter in the mail two months in advance. If you don't receive a letter, please call our office to schedule the follow-up appointment.  If you need a refill on your cardiac medications before your next appointment, please call your pharmacy.

## 2017-09-11 LAB — CUP PACEART INCLINIC DEVICE CHECK
Battery Remaining Longevity: 110 mo
Battery Voltage: 3.01 V
Brady Statistic RV Percent Paced: 0.02 %
HighPow Impedance: 75 Ohm
Implantable Lead Location: 753860
Implantable Pulse Generator Implant Date: 20150331
Lead Channel Impedance Value: 361 Ohm
Lead Channel Impedance Value: 475 Ohm
Lead Channel Pacing Threshold Amplitude: 0.875 V
Lead Channel Setting Pacing Amplitude: 2.5 V
Lead Channel Setting Sensing Sensitivity: 0.3 mV
MDC IDC LEAD IMPLANT DT: 20150331
MDC IDC MSMT LEADCHNL RV PACING THRESHOLD PULSEWIDTH: 0.4 ms
MDC IDC MSMT LEADCHNL RV SENSING INTR AMPL: 4.75 mV
MDC IDC MSMT LEADCHNL RV SENSING INTR AMPL: 4.75 mV
MDC IDC SESS DTM: 20181119163851
MDC IDC SET LEADCHNL RV PACING PULSEWIDTH: 0.4 ms

## 2017-09-19 ENCOUNTER — Other Ambulatory Visit: Payer: Self-pay

## 2017-10-28 ENCOUNTER — Ambulatory Visit (INDEPENDENT_AMBULATORY_CARE_PROVIDER_SITE_OTHER): Payer: Medicare Other | Admitting: *Deleted

## 2017-10-28 ENCOUNTER — Telehealth: Payer: Self-pay | Admitting: Cardiology

## 2017-10-28 DIAGNOSIS — C61 Malignant neoplasm of prostate: Secondary | ICD-10-CM | POA: Diagnosis not present

## 2017-10-28 DIAGNOSIS — I428 Other cardiomyopathies: Secondary | ICD-10-CM

## 2017-10-28 NOTE — Telephone Encounter (Signed)
Confirmed remote transmission w/ pt wife.   

## 2017-10-29 NOTE — Progress Notes (Signed)
Remote ICD transmission.   

## 2017-10-30 LAB — CUP PACEART REMOTE DEVICE CHECK
Battery Remaining Longevity: 106 mo
Brady Statistic RV Percent Paced: 0.01 %
Date Time Interrogation Session: 20190115191018
HIGH POWER IMPEDANCE MEASURED VALUE: 76 Ohm
Implantable Lead Implant Date: 20150331
Implantable Lead Location: 753860
Lead Channel Impedance Value: 304 Ohm
Lead Channel Impedance Value: 456 Ohm
Lead Channel Pacing Threshold Amplitude: 0.875 V
Lead Channel Sensing Intrinsic Amplitude: 5.125 mV
Lead Channel Setting Sensing Sensitivity: 0.3 mV
MDC IDC MSMT BATTERY VOLTAGE: 3.01 V
MDC IDC MSMT LEADCHNL RV PACING THRESHOLD PULSEWIDTH: 0.4 ms
MDC IDC MSMT LEADCHNL RV SENSING INTR AMPL: 5.125 mV
MDC IDC PG IMPLANT DT: 20150331
MDC IDC SET LEADCHNL RV PACING AMPLITUDE: 2.5 V
MDC IDC SET LEADCHNL RV PACING PULSEWIDTH: 0.4 ms

## 2017-10-31 ENCOUNTER — Encounter: Payer: Self-pay | Admitting: Cardiology

## 2017-11-04 DIAGNOSIS — R32 Unspecified urinary incontinence: Secondary | ICD-10-CM | POA: Diagnosis not present

## 2017-11-04 DIAGNOSIS — C61 Malignant neoplasm of prostate: Secondary | ICD-10-CM | POA: Diagnosis not present

## 2017-11-11 DIAGNOSIS — I10 Essential (primary) hypertension: Secondary | ICD-10-CM | POA: Diagnosis not present

## 2017-11-11 DIAGNOSIS — E7800 Pure hypercholesterolemia, unspecified: Secondary | ICD-10-CM | POA: Diagnosis not present

## 2017-11-18 DIAGNOSIS — C61 Malignant neoplasm of prostate: Secondary | ICD-10-CM | POA: Diagnosis not present

## 2017-11-18 DIAGNOSIS — Z6836 Body mass index (BMI) 36.0-36.9, adult: Secondary | ICD-10-CM | POA: Diagnosis not present

## 2017-11-18 DIAGNOSIS — K862 Cyst of pancreas: Secondary | ICD-10-CM | POA: Diagnosis not present

## 2017-11-18 DIAGNOSIS — Z0000 Encounter for general adult medical examination without abnormal findings: Secondary | ICD-10-CM | POA: Diagnosis not present

## 2017-11-18 DIAGNOSIS — I10 Essential (primary) hypertension: Secondary | ICD-10-CM | POA: Diagnosis not present

## 2017-11-18 DIAGNOSIS — E7800 Pure hypercholesterolemia, unspecified: Secondary | ICD-10-CM | POA: Diagnosis not present

## 2017-11-18 DIAGNOSIS — I428 Other cardiomyopathies: Secondary | ICD-10-CM | POA: Diagnosis not present

## 2017-11-18 DIAGNOSIS — Z1389 Encounter for screening for other disorder: Secondary | ICD-10-CM | POA: Diagnosis not present

## 2017-11-18 DIAGNOSIS — I480 Paroxysmal atrial fibrillation: Secondary | ICD-10-CM | POA: Diagnosis not present

## 2017-11-18 DIAGNOSIS — Z95810 Presence of automatic (implantable) cardiac defibrillator: Secondary | ICD-10-CM | POA: Diagnosis not present

## 2017-11-18 DIAGNOSIS — R7301 Impaired fasting glucose: Secondary | ICD-10-CM | POA: Diagnosis not present

## 2017-11-18 DIAGNOSIS — G4733 Obstructive sleep apnea (adult) (pediatric): Secondary | ICD-10-CM | POA: Diagnosis not present

## 2018-01-08 DIAGNOSIS — H353132 Nonexudative age-related macular degeneration, bilateral, intermediate dry stage: Secondary | ICD-10-CM | POA: Diagnosis not present

## 2018-01-08 DIAGNOSIS — H5203 Hypermetropia, bilateral: Secondary | ICD-10-CM | POA: Diagnosis not present

## 2018-01-08 DIAGNOSIS — Z961 Presence of intraocular lens: Secondary | ICD-10-CM | POA: Diagnosis not present

## 2018-01-27 ENCOUNTER — Ambulatory Visit (INDEPENDENT_AMBULATORY_CARE_PROVIDER_SITE_OTHER): Payer: Medicare Other | Admitting: *Deleted

## 2018-01-27 DIAGNOSIS — I428 Other cardiomyopathies: Secondary | ICD-10-CM | POA: Diagnosis not present

## 2018-01-27 NOTE — Progress Notes (Signed)
Remote ICD transmission.   

## 2018-01-28 LAB — CUP PACEART REMOTE DEVICE CHECK
Battery Remaining Longevity: 103 mo
Battery Voltage: 3.01 V
HIGH POWER IMPEDANCE MEASURED VALUE: 76 Ohm
Implantable Lead Implant Date: 20150331
Lead Channel Impedance Value: 342 Ohm
Lead Channel Impedance Value: 475 Ohm
Lead Channel Sensing Intrinsic Amplitude: 5.75 mV
Lead Channel Setting Pacing Amplitude: 2.5 V
Lead Channel Setting Pacing Pulse Width: 0.4 ms
MDC IDC LEAD LOCATION: 753860
MDC IDC MSMT LEADCHNL RV PACING THRESHOLD AMPLITUDE: 0.875 V
MDC IDC MSMT LEADCHNL RV PACING THRESHOLD PULSEWIDTH: 0.4 ms
MDC IDC MSMT LEADCHNL RV SENSING INTR AMPL: 5.75 mV
MDC IDC PG IMPLANT DT: 20150331
MDC IDC SESS DTM: 20190416163426
MDC IDC SET LEADCHNL RV SENSING SENSITIVITY: 0.3 mV
MDC IDC STAT BRADY RV PERCENT PACED: 0.01 %

## 2018-01-29 ENCOUNTER — Encounter: Payer: Self-pay | Admitting: Cardiology

## 2018-02-08 ENCOUNTER — Other Ambulatory Visit: Payer: Self-pay | Admitting: Cardiovascular Disease

## 2018-02-09 NOTE — Telephone Encounter (Signed)
REFILL 

## 2018-02-20 ENCOUNTER — Telehealth: Payer: Self-pay | Admitting: Cardiovascular Disease

## 2018-02-20 NOTE — Telephone Encounter (Signed)
Pt refused message, disregard

## 2018-02-24 ENCOUNTER — Ambulatory Visit (HOSPITAL_COMMUNITY)
Admission: RE | Admit: 2018-02-24 | Payer: Medicare Other | Source: Ambulatory Visit | Attending: Cardiovascular Disease | Admitting: Cardiovascular Disease

## 2018-03-12 ENCOUNTER — Encounter: Payer: Self-pay | Admitting: Cardiovascular Disease

## 2018-03-12 ENCOUNTER — Ambulatory Visit (INDEPENDENT_AMBULATORY_CARE_PROVIDER_SITE_OTHER): Payer: Medicare Other | Admitting: Cardiovascular Disease

## 2018-03-12 VITALS — BP 100/68 | HR 82 | Ht 70.0 in | Wt 246.0 lb

## 2018-03-12 DIAGNOSIS — Z95810 Presence of automatic (implantable) cardiac defibrillator: Secondary | ICD-10-CM

## 2018-03-12 DIAGNOSIS — I714 Abdominal aortic aneurysm, without rupture, unspecified: Secondary | ICD-10-CM

## 2018-03-12 DIAGNOSIS — I428 Other cardiomyopathies: Secondary | ICD-10-CM

## 2018-03-12 DIAGNOSIS — I472 Ventricular tachycardia, unspecified: Secondary | ICD-10-CM

## 2018-03-12 DIAGNOSIS — Z79899 Other long term (current) drug therapy: Secondary | ICD-10-CM | POA: Diagnosis not present

## 2018-03-12 DIAGNOSIS — E785 Hyperlipidemia, unspecified: Secondary | ICD-10-CM | POA: Diagnosis not present

## 2018-03-12 DIAGNOSIS — Z5181 Encounter for therapeutic drug level monitoring: Secondary | ICD-10-CM

## 2018-03-12 DIAGNOSIS — I2510 Atherosclerotic heart disease of native coronary artery without angina pectoris: Secondary | ICD-10-CM

## 2018-03-12 NOTE — Progress Notes (Signed)
Patient ID: William [name], male   DOB: [birth year], 84 y.o.   MRN: [id number]    Cardiology Office Note    Date:  03/14/2018   ID:  Doyne Keel Erion, DOB [birth year], MRN [id number]  PCP:  Haywood Pao, MD  Cardiologist:   Sanda Klein, MD   Chief Complaint  Patient presents with  . Follow-up    VT, ICD    History of Present Illness:  William [name] is a 84 y.o. male with recurrent nonsustained and sustained ventricular tachycardia in the setting of mild nonischemic cardiomyopathy without overt congestive heart failure. Attempts to wean amiodarone therapy have lead to recurrent ventricular arrhythmia, but he responds very well to the antiarrhythmic when it is restarted. 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 He underwent a dual-chamber defibrillator generator change and placement of a new defibrillator lead (old lead was Sprint fidelis under advisory) in 2015. He had successful termination of sustained VT with antitachycardia pacing in February 2016, but has not required device shocks. He has a small infrarenal abdominal aortic aneurysm, 4 cm by duplex ultrasonography, but has firmly decided that he does not want any surgical or catheter-based interventions for this.  William [name] remains mostly plagued by problems with low back pain and is less active.  He has really never recovered from his fall and spinal fracture.  His wife is frustrated that she cannot get him out of the chair.  He is afraid of falling.  He has not actually had any recent falls or bleeding problems.  He denies dyspnea, orthopnea, PND, worsening leg edema, palpitations or syncope.  He does have some orthostatic dizziness and chronic ankle swelling, particularly on the left side.  Defibrillator interrogation shows no episodes of sustained tachycardia that required intervention.  There have been 4 episodes of nonsustained ventricular tachycardia in the last 6 months, the longest only 2 seconds in  duration.Marland Kitchen He never requires ventricular pacing. There has been no atrial fibrillation. Battery longevity is 8.6 well ventilated years. His optivol shows no signs of fluid overload.  Past Medical History:  Diagnosis Date  . AAA (abdominal aortic aneurysm) ([place])    7/13 3.8cm  . Arthritis    "joints" (01/11/2014)  . Asthma    "seasonal; some foods"   . Automatic implantable cardioverter-defibrillator in situ   . CHF (congestive heart failure) ([place])   . Dyslipidemia   . Dysrhythmia   . Hemorrhage    Post-colonostomy  . Hernia   . High cholesterol   . Lumbar vertebral fracture (H[facility])    L1- 04/11/2014   . Prostate cancer ([place])   . S/P ICD (internal cardiac defibrillator) procedure, 01/11/14 removal of ERI gen and placement of Medtronic Evera XT VR & NEW Right ventricular lead Medtronic 01/12/2014  . Shortness of breath    with exerton   . Sleep apnea    "lost 60# & don't have it anymore" (01/11/2014)  . Systolic dysfunction, left ventricle   . Ventricular tachycardia [facility])     Past Surgical History:  Procedure Laterality Date  . CARDIAC CATHETERIZATION  08/20/2004   noncritical CAD,mild global hypokinesis, EF 50%  . CARDIAC DEFIBRILLATOR PLACEMENT  08/23/2004   Medtronic  . CATARACT EXTRACTION W/ INTRAOCULAR LENS IMPLANT Right   . COLECTOMY  1990's  . CRYOABLATION N/A 02/24/2014   Procedure: CRYO ABLATION PROSTATE;  Surgeon: Ailene Rud, MD;  Location: [facility];  Service: Urology;  Laterality: N/A;  . HERNIA REPAIR     "abdomen;  from colon OR"  . ICD GENERATOR CHANGE  01/11/2014   "upgrade"  . IMPLANTABLE CARDIOVERTER DEFIBRILLATOR (ICD) GENERATOR CHANGE N/A 01/11/2014   Procedure: ICD GENERATOR CHANGE;  Surgeon: Sanda Klein, MD;  Location: [place] CATH LAB;  Service: Cardiovascular;  Laterality: N/A;  . LEAD REVISION N/A 01/11/2014   Procedure: LEAD REVISION;  Surgeon: Sanda Klein, MD;  Location: [place] CATH LAB;  Service: Cardiovascular;  Laterality: N/A;  . NM MYOCAR PERF WALL  MOTION  01/29/2012   abnormal c/o infarct/scar,no ischemia present  . PROSTATE BIOPSY N/A 11/22/2013   Procedure: PROSTATE BIOPSY AND ULTRASOUND;  Surgeon: Ailene Rud, MD;  Location[facility];  Service: Urology;  Laterality: N/A;    Current Medications: Outpatient Medications Prior to Visit  Medication Sig Dispense Refill  . acetaminophen (TYLENOL) 500 MG tablet Take 500 mg by mouth every 6 (six) hours as needed (for pain).     Marland Kitchen amiodarone (PACERONE) 200 MG tablet Take 0.5 tablets (100 mg total) by mouth daily. KEEP OV. 45 tablet 0  . ezetimibe-simvastatin (VYTORIN) 10-20 MG per tablet Take 1 tablet by mouth at bedtime.    Marland Kitchen loratadine (CLARITIN) 10 MG tablet Take 10 mg by mouth daily as needed for allergies.     . metoprolol succinate (TOPROL-XL) 50 MG 24 hr tablet Take 1 tablet (50 mg total) daily by mouth. Take with or immediately following a meal. 90 tablet 3  . polyethylene glycol (MIRALAX / GLYCOLAX) packet Take 17 g by mouth every evening. 14 each 1  . polyethylene glycol powder (GLYCOLAX/MIRALAX) powder MIX 17 GRAMS IN 8 OUNCES OF WATER EVERY DAY. 1581 g 0  . furosemide (LASIX) 20 MG tablet TAKE 1 TABLET(20 MG) BY MOUTH DAILY FOR 2 TO 4 DAYS FOR SWELLING THEN STOP (Patient not taking: Reported on 09/01/2017) 30 tablet 1  . potassium chloride (K-DUR) 10 MEQ tablet TAKE 1 TABLET BY MOUTH DAILY ALONG WITH LASIX FOR 2 TO 4 DAYS THEN STOP 30 tablet 1   No facility-administered medications prior to visit.      Allergies:   Crestor [rosuvastatin] and Lipitor [atorvastatin]   Social History   Socioeconomic History  . Marital status: Married    Spouse name: Not on file  . Number of children: Not on file  . Years of education: Not on file  . Highest education level: Not on file  Occupational History  . Not on file  Social Needs  . Financial resource strain: Not on file  . Food insecurity:    Worry: Not on file    Inability: Not on file  . Transportation needs:    Medical:  Not on file    Non-medical: Not on file  Tobacco Use  . Smoking status: Never Smoker  . Smokeless tobacco: Never Used  Substance and Sexual Activity  . Alcohol use: Yes    Alcohol/week: 0.0 oz    Comment: 01/11/2014 "drink 1/2 of a beer twice per month   . Drug use: No  . Sexual activity: Never  Lifestyle  . Physical activity:    Days per week: Not on file    Minutes per session: Not on file  . Stress: Not on file  Relationships  . Social connections:    Talks on phone: Not on file    Gets together: Not on file    Attends religious service: Not on file    Active member of club or organization: Not on file    Attends meetings of clubs or organizations:  Not on file    Relationship status: Not on file  Other Topics Concern  . Not on file  Social History Narrative  . Not on file     Family History:  The patient's family history includes Heart failure in his father and mother.   ROS:   Please see the history of present illness.    ROS All other systems reviewed and are negative.   PHYSICAL EXAM:   VS:  BP 100/68   Pulse 82   Ht 5\' 10"  (1.778 m)   Wt 246 lb (111.6 kg)   BMI 35.30 kg/m     General: Alert, oriented x3, no distress, Moderately obese Head: no evidence of trauma, PERRL, EOMI, no exophtalmos or lid lag, no myxedema, no xanthelasma; normal ears, nose and oropharynx Neck: normal jugular venous pulsations and no hepatojugular reflux; brisk carotid pulses without delay and no carotid bruits Chest: clear to auscultation, no signs of consolidation by percussion or palpation, normal fremitus, symmetrical and full respiratory excursions Cardiovascular: normal position and quality of the apical impulse, regular rhythm, normal first and second heart sounds, no murmurs, rubs or gallops.  Healthy left subclavian pacemaker site Abdomen: no tenderness or distention, no masses by palpation, no abnormal pulsatility or arterial bruits, normal bowel sounds, no  hepatosplenomegaly Extremities: no clubbing, cyanosis; 2+ left ankle edema, 1+ right ankle edema; 2+ radial, ulnar and brachial pulses bilaterally; 2+ right femoral, posterior tibial and dorsalis pedis pulses; 2+ left femoral, posterior tibial and dorsalis pedis pulses; no subclavian or femoral bruits Neurological: grossly nonfocal Psych: Normal mood and affect   Wt Readings from Last 3 Encounters:  03/12/18 246 lb (111.6 kg)  09/01/17 245 lb (111.1 kg)  01/21/17 238 lb (108 kg)      Studies/Labs Reviewed:   EKG:  EKG is ordered today.  The ekg ordered today demonstrates sinus rhythm with first-degree AV block, pre-existing nonspecific IVCD with left axis deviation, QRS 144 ms, QTC 530 ms Recent Labs: No results found for requested labs within last 8760 hours.  January 2017 lipid profile total cholesterol 118, triglycerides 111, HDL 28, LDL 68  More recent labs performed at [facility] showed normal liver function tests and TSH in August.  All lipid parameters within desirable range in January 2018, except for chronically low HDL cholesterol  ASSESSMENT:    1. VT (ventricular tachycardia) ([place])   2. Nonischemic cardiomyopathy ([place])   3. Encounter for monitoring amiodarone therapy   4. AAA (abdominal aortic aneurysm) without rupture ([place])   5. S/P ICD (internal cardiac defibrillator) procedure   6. Coronary artery disease involving native coronary artery of native heart without angina pectoris   7. Dyslipidemia      PLAN:  In order of problems listed above:  1. VT: Whenever we try to discontinue amiodarone he had lengthy episodes of sustained VT that required device intervention.  We will keep him on amiodarone.   2. CMP: He has mildly depressed left ventricular systolic function without overt hypervolemia or shortness of breath.  His blood pressure is lower than ever and he still has complaints of orthostatic dizziness.  He will not tolerate R AAS inhibitors.   May have to cut back on his beta-blocker further in the future. 3. Amiodarone: Needs liver function tests and thyroid function tests every 6 months.  Reminded him to promptly report any lung complaints. 4. AAA: Stable in size and asymptomatic.  He again vehemently opposes any intervention for this, even if it  is just catheter-based.  Therefore I do not think there is any point in routine ultrasound monitoring. 5. ICD: Remote downloads every 3 months and office visits every 6 months for VT and amiodarone monitoring 6. CAD: Moderate stenoses by previous angiography, he has never had angina pectoris. 7. HLP: Aware of the possible interaction between amiodarone and simvastatin, but he has tolerated this combination without side effects for years.  Labs followed in PCPs office.  We discussed the fact that he needs to maintain his mobility and that deconditioning will further increase his risk of falling and injury.  Somehow he needs to perform regular walking in a supervised, safe environment.   Medication Adjustments/Labs and Tests Ordered: Current medicines are reviewed at length with the patient today.  Concerns regarding medicines are outlined above.  Medication changes, Labs and Tests ordered today are listed in the Patient Instructions below. Patient Instructions  Dr Sallyanne Kuster recommends that you continue on your current medications as directed. Please refer to the Current Medication list given to you today.  Remote monitoring is used to monitor your Pacemaker or ICD from home. This monitoring reduces the number of office visits required to check your device to one time per year. It allows [place] to keep an eye on the functioning of your device to ensure it is working properly. You are scheduled for a device check from home on Tuesday, July 16th, 2019. You may send your transmission at any time that day. If you have a wireless device, the transmission will be sent automatically. After your physician  reviews your transmission, you will receive a postcard with your next transmission date.  To improve our patient care and to more adequately follow your device, [facility] has decided, as a practice, to start following each patient four times a year with your home monitor. This means that you may experience a remote appointment that is close to an in-office appointment with your physician. Your insurance will apply at the same rate as other remote monitoring transmissions.  Dr Sallyanne Kuster recommends that you schedule a follow-up appointment in 6 months with an ICD check. You will receive a reminder letter in the mail two months in advance. If you don't receive a letter, please call our office to schedule the follow-up appointment.  If you need a refill on your cardiac medications before your next appointment, please call your pharmacy.    Signed, Sanda Klein, MD  03/14/2018 10:12 AM    [facility] [address], [place], [place]  [address] Phone: [phone]; Fax: [phone]

## 2018-03-12 NOTE — Patient Instructions (Signed)
Dr Sallyanne Kuster recommends that you continue on your current medications as directed. Please refer to the Current Medication list given to you today.  Remote monitoring is used to monitor your Pacemaker or ICD from home. This monitoring reduces the number of office visits required to check your device to one time per year. It allows [place] to keep an eye on the functioning of your device to ensure it is working properly. You are scheduled for a device check from home on Tuesday, July 16th, 2019. You may send your transmission at any time that day. If you have a wireless device, the transmission will be sent automatically. After your physician reviews your transmission, you will receive a postcard with your next transmission date.  To improve our patient care and to more adequately follow your device, [facility] has decided, as a practice, to start following each patient four times a year with your home monitor. This means that you may experience a remote appointment that is close to an in-office appointment with your physician. Your insurance will apply at the same rate as other remote monitoring transmissions.  Dr Sallyanne Kuster recommends that you schedule a follow-up appointment in 6 months with an ICD check. You will receive a reminder letter in the mail two months in advance. If you don't receive a letter, please call our office to schedule the follow-up appointment.  If you need a refill on your cardiac medications before your next appointment, please call your pharmacy.

## 2018-04-17 LAB — CUP PACEART INCLINIC DEVICE CHECK
Date Time Interrogation Session: 20190705153327
Implantable Lead Location: 753860
Lead Channel Setting Pacing Amplitude: 2.5 V
Lead Channel Setting Pacing Pulse Width: 0.4 ms
Lead Channel Setting Sensing Sensitivity: 0.3 mV
MDC IDC LEAD IMPLANT DT: 20150331
MDC IDC PG IMPLANT DT: 20150331

## 2018-04-28 ENCOUNTER — Ambulatory Visit (INDEPENDENT_AMBULATORY_CARE_PROVIDER_SITE_OTHER): Payer: Medicare Other | Admitting: *Deleted

## 2018-04-28 DIAGNOSIS — I428 Other cardiomyopathies: Secondary | ICD-10-CM

## 2018-04-28 NOTE — Progress Notes (Signed)
Remote ICD transmission.   

## 2018-04-29 ENCOUNTER — Encounter: Payer: Self-pay | Admitting: Cardiology

## 2018-04-29 ENCOUNTER — Other Ambulatory Visit (INDEPENDENT_AMBULATORY_CARE_PROVIDER_SITE_OTHER): Payer: Self-pay | Admitting: Specialist

## 2018-04-29 LAB — CUP PACEART REMOTE DEVICE CHECK
Battery Remaining Longevity: 99 mo
Battery Voltage: 3.01 V
Brady Statistic RV Percent Paced: 0.01 %
Date Time Interrogation Session: 20190716154832
HIGH POWER IMPEDANCE MEASURED VALUE: 72 Ohm
Lead Channel Impedance Value: 399 Ohm
Lead Channel Impedance Value: 475 Ohm
Lead Channel Pacing Threshold Pulse Width: 0.4 ms
Lead Channel Sensing Intrinsic Amplitude: 6.5 mV
Lead Channel Setting Pacing Amplitude: 2.5 V
Lead Channel Setting Pacing Pulse Width: 0.4 ms
MDC IDC LEAD IMPLANT DT: 20150331
MDC IDC LEAD LOCATION: 753860
MDC IDC MSMT LEADCHNL RV PACING THRESHOLD AMPLITUDE: 0.875 V
MDC IDC MSMT LEADCHNL RV SENSING INTR AMPL: 6.5 mV
MDC IDC PG IMPLANT DT: 20150331
MDC IDC SET LEADCHNL RV SENSING SENSITIVITY: 0.3 mV

## 2018-04-29 NOTE — Telephone Encounter (Signed)
polyethylene glycol powder refill request

## 2018-05-11 ENCOUNTER — Other Ambulatory Visit: Payer: Self-pay | Admitting: Cardiovascular Disease

## 2018-05-12 NOTE — Telephone Encounter (Signed)
Rx request sent to pharmacy.  

## 2018-05-19 DIAGNOSIS — E7800 Pure hypercholesterolemia, unspecified: Secondary | ICD-10-CM | POA: Diagnosis not present

## 2018-05-19 DIAGNOSIS — C61 Malignant neoplasm of prostate: Secondary | ICD-10-CM | POA: Diagnosis not present

## 2018-05-19 DIAGNOSIS — E669 Obesity, unspecified: Secondary | ICD-10-CM | POA: Diagnosis not present

## 2018-05-19 DIAGNOSIS — R7301 Impaired fasting glucose: Secondary | ICD-10-CM | POA: Diagnosis not present

## 2018-05-19 DIAGNOSIS — Z95810 Presence of automatic (implantable) cardiac defibrillator: Secondary | ICD-10-CM | POA: Diagnosis not present

## 2018-05-19 DIAGNOSIS — H6123 Impacted cerumen, bilateral: Secondary | ICD-10-CM | POA: Diagnosis not present

## 2018-05-19 DIAGNOSIS — D692 Other nonthrombocytopenic purpura: Secondary | ICD-10-CM | POA: Diagnosis not present

## 2018-05-19 DIAGNOSIS — I428 Other cardiomyopathies: Secondary | ICD-10-CM | POA: Diagnosis not present

## 2018-05-19 DIAGNOSIS — I10 Essential (primary) hypertension: Secondary | ICD-10-CM | POA: Diagnosis not present

## 2018-05-19 DIAGNOSIS — I480 Paroxysmal atrial fibrillation: Secondary | ICD-10-CM | POA: Diagnosis not present

## 2018-05-19 DIAGNOSIS — G4733 Obstructive sleep apnea (adult) (pediatric): Secondary | ICD-10-CM | POA: Diagnosis not present

## 2018-05-19 DIAGNOSIS — I714 Abdominal aortic aneurysm, without rupture: Secondary | ICD-10-CM | POA: Diagnosis not present

## 2018-07-01 NOTE — Progress Notes (Signed)
Cardiology Office Note:    Date:  07/02/2018   ID:  William [name], DOB [birth year], MRN [id number]  PCP:  Haywood Pao, MD  Cardiologist:  Sanda Klein, MD   Referring MD: Haywood Pao, MD   Chief Complaint  Patient presents with  . Shortness of Breath    History of Present Illness:    William [name] is a 84 y.o. male with a hx of both sustained and nonsustained ventricular tachycardia in the setting of mild nonischemic cardiomyopathy.  He is maintained on amiodarone therapy and has failed previous attempts to wean down amiodarone.  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  He has a dual-chamber defibrillator in place.  Heart failure medications have not been able to be titrated. He has chronic complaints of orthostatic dizziness and marginal blood pressures.  At his last visit with Dr. Sallyanne Kuster on 03/12/2018 he reportedly was afraid to walk because he did not want to fall again.  He was encouraged to stay active.  He was maintained on amiodarone at that time.  He needs LFTs and PFTs every 6 months.  He returns today for clinic follow-up.  He presents with his wife.  He has been having progressively worsening shortness of breath over the past month.  Over the past 2 weeks, his shortness of breath and lower extremity swelling have worsened.  He tried to take 20 mg of Lasix daily for 4 days.  This resulted in increased voiding but did not improve his breathing or his lower extremity swelling.  O2 sat in room is 97%; however, he is using accessory muscles and breathing very shallow.   Past Medical History:  Diagnosis Date  . AAA (abdominal aortic aneurysm) ([place])    7/13 3.8cm  . Arthritis    "joints" (01/11/2014)  . Asthma    "seasonal; some foods"   . Automatic implantable cardioverter-defibrillator in situ   . CHF (congestive heart failure) ([place])   . Dyslipidemia   . Dysrhythmia   . Hemorrhage    Post-colonostomy  . Hernia   . High cholesterol     . Lumbar vertebral fracture (H[facility])    L1- 04/11/2014   . Prostate cancer ([place])   . S/P ICD (internal cardiac defibrillator) procedure, 01/11/14 removal of ERI gen and placement of Medtronic Evera XT VR & NEW Right ventricular lead Medtronic 01/12/2014  . Shortness of breath    with exerton   . Sleep apnea    "lost 60# & don't have it anymore" (01/11/2014)  . Systolic dysfunction, left ventricle   . Ventricular tachycardia [facility])     Past Surgical History:  Procedure Laterality Date  . CARDIAC CATHETERIZATION  08/20/2004   noncritical CAD,mild global hypokinesis, EF 50%  . CARDIAC DEFIBRILLATOR PLACEMENT  08/23/2004   Medtronic  . CATARACT EXTRACTION W/ INTRAOCULAR LENS IMPLANT Right   . COLECTOMY  1990's  . CRYOABLATION N/A 02/24/2014   Procedure: CRYO ABLATION PROSTATE;  Surgeon: Ailene Rud, MD;  [facility];  Service: Urology;  Laterality: N/A;  . HERNIA REPAIR     "abdomen; from colon OR"  . ICD GENERATOR CHANGE  01/11/2014   "upgrade"  . IMPLANTABLE CARDIOVERTER DEFIBRILLATOR (ICD) GENERATOR CHANGE N/A 01/11/2014   Procedure: ICD GENERATOR CHANGE;  Surgeon: Sanda Klein, MD;  Location: [place] CATH LAB;  Service: Cardiovascular;  Laterality: N/A;  . LEAD REVISION N/A 01/11/2014   Procedure: LEAD REVISION;  Surgeon: Sanda Klein, MD;  Location: [place] CATH LAB;  Service:  Cardiovascular;  Laterality: N/A;  . NM MYOCAR PERF WALL MOTION  01/29/2012   abnormal c/o infarct/scar,no ischemia present  . PROSTATE BIOPSY N/A 11/22/2013   Procedure: PROSTATE BIOPSY AND ULTRASOUND;  Surgeon: Ailene Rud, MD;  [facility];  Service: Urology;  Laterality: N/A;    Current Medications: Current Meds  Medication Sig  . acetaminophen (TYLENOL) 500 MG tablet Take 500 mg by mouth every 6 (six) hours as needed (for pain).   Marland Kitchen amiodarone (PACERONE) 200 MG tablet TAKE 1/2 TABLET BY MOUTH EVERY DAY  . ezetimibe-simvastatin (VYTORIN) 10-20 MG per tablet Take 1 tablet by mouth at bedtime.   Marland Kitchen loratadine (CLARITIN) 10 MG tablet Take 10 mg by mouth daily as needed for allergies.   . metoprolol succinate (TOPROL-XL) 50 MG 24 hr tablet Take 1 tablet (50 mg total) daily by mouth. Take with or immediately following a meal.  . polyethylene glycol (MIRALAX / GLYCOLAX) packet Take 17 g by mouth every evening.  . polyethylene glycol powder (GLYCOLAX/MIRALAX) powder MIX 17 GRAMS IN 8 OUNCES OF WATER EVERY DAY.     Allergies:   Crestor [rosuvastatin] and Lipitor [atorvastatin]   Social History   Socioeconomic History  . Marital status: Married    Spouse name: Not on file  . Number of children: Not on file  . Years of education: Not on file  . Highest education level: Not on file  Occupational History  . Not on file  Social Needs  . Financial resource strain: Not on file  . Food insecurity:    Worry: Not on file    Inability: Not on file  . Transportation needs:    Medical: Not on file    Non-medical: Not on file  Tobacco Use  . Smoking status: Never Smoker  . Smokeless tobacco: Never Used  Substance and Sexual Activity  . Alcohol use: Yes    Alcohol/week: 0.0 standard drinks    Comment: 01/11/2014 "drink 1/2 of a beer twice per month   . Drug use: No  . Sexual activity: Never  Lifestyle  . Physical activity:    Days per week: Not on file    Minutes per session: Not on file  . Stress: Not on file  Relationships  . Social connections:    Talks on phone: Not on file    Gets together: Not on file    Attends religious service: Not on file    Active member of club or organization: Not on file    Attends meetings of clubs or organizations: Not on file    Relationship status: Not on file  Other Topics Concern  . Not on file  Social History Narrative  . Not on file     Family History: The patient's family history includes Heart failure in his father and mother.  ROS:   Please see the history of present illness.     All other systems reviewed and are  negative.  EKGs/Labs/Other Studies Reviewed:    The following studies were reviewed today:  none  EKG:  EKG is ordered today.  The ekg ordered today demonstrates frequent ectopy  Recent Labs: No results found for requested labs within last 8760 hours.  Recent Lipid Panel No results found for: CHOL, TRIG, HDL, CHOLHDL, VLDL, LDLCALC, LDLDIRECT  Physical Exam:    VS:  BP (!) 100/54   Pulse 98   Ht 5\' 10"  (1.778 m)   Wt 242 lb (109.8 kg)   BMI 34.72 kg/m  Wt Readings from Last 3 Encounters:  07/02/18 242 lb (109.8 kg)  03/12/18 246 lb (111.6 kg)  09/01/17 245 lb (111.1 kg)     GEN: elderly male in a wheelchair appears to be in respiratory distress, using accessory muscles HEENT: Normal NECK: + JVD CARDIAC: Irregular rhythm, regular rate RESPIRATORY:  Severely diminished in bases  ABDOMEN: Soft, non-tender, non-distended MUSCULOSKELETAL:2+ B LE edema; No deformity  SKIN: Warm and dry NEUROLOGIC:  Alert and oriented x 3 PSYCHIATRIC:  Normal affect   ASSESSMENT:    1. Acute on chronic combined systolic and diastolic CHF (congestive heart failure) ([place])   2. Nonischemic cardiomyopathy ([place])   3. Sustained VT (ventricular tachycardia) ([place])   4. S/P ICD (internal cardiac defibrillator) procedure, 01/11/14 removal of ERI gen and placement of Medtronic Evera XT VR & NEW Right ventricular lead Medtronic   5. Atherosclerosis of native coronary artery of native heart without angina pectoris   6. Other hyperlipidemia    PLAN:    In order of problems listed above:  Acute on chronic combined systolic and diastolic CHF (congestive heart failure) ([place]) - Plan: EKG 12-Lead Nonischemic cardiomyopathy ([place]) On exam he is using accessory muscles, is having increased work of breathing, breathing very shallow, JVD present, diminished breath sounds in bases, and lower extremity swelling.  He is taking 50 mg of Toprol pressures continue to be marginal.  Pressure today is 100/54.  Case  was reviewed with Dr. Oval Linsey.  Given his overall clinical picture, I do not think that oral Lasix will breathing.  Decision was made to admit to the hospital for IV diuresis and repeat echocardiogram.   Sustained VT (ventricular tachycardia) ([place]) He is having frequent ectopy on EKG today.  I cannot increase his Lopressor given his marginal pressures in addition to diuresis.  He seems asymptomatic.  S/P ICD (internal cardiac defibrillator) procedure, 01/11/14 removal of ERI gen and placement of Medtronic Evera XT VR & NEW Right ventricular lead Medtronic Interrogate device on arrival.  Atherosclerosis of native coronary artery of native heart without angina pectoris No chest pain. [place]. I do not suspect an ACS process.   Other hyperlipidemia Continue statin.  Admit to cardiology for IV diuresis.   Medication Adjustments/Labs and Tests Ordered: Current medicines are reviewed at length with the patient today.  Concerns regarding medicines are outlined above.  Orders Placed This Encounter  Procedures  . EKG 12-Lead   No orders of the defined types were placed in this encounter.   Signed, Ledora Bottcher, [place]  07/02/2018 4:47 PM    [facility]

## 2018-07-02 ENCOUNTER — Other Ambulatory Visit: Payer: Self-pay

## 2018-07-02 ENCOUNTER — Ambulatory Visit (INDEPENDENT_AMBULATORY_CARE_PROVIDER_SITE_OTHER): Payer: Medicare Other | Admitting: Physician Assistant

## 2018-07-02 ENCOUNTER — Inpatient Hospital Stay (HOSPITAL_COMMUNITY)
Admission: AD | Admit: 2018-07-02 | Discharge: 2018-07-07 | DRG: 286 | Disposition: A | Discharge status: Home / Self Care | Payer: Medicare Other | Attending: Cardiology | Admitting: Cardiology

## 2018-07-02 ENCOUNTER — Encounter (HOSPITAL_COMMUNITY): Payer: Self-pay | Admitting: General Practice

## 2018-07-02 ENCOUNTER — Encounter: Payer: Self-pay | Admitting: Physician Assistant

## 2018-07-02 VITALS — BP 100/54 | HR 98 | Ht 70.0 in | Wt 242.0 lb

## 2018-07-02 DIAGNOSIS — I77810 Thoracic aortic ectasia: Secondary | ICD-10-CM | POA: Diagnosis present

## 2018-07-02 DIAGNOSIS — I472 Ventricular tachycardia, unspecified: Secondary | ICD-10-CM

## 2018-07-02 DIAGNOSIS — I5033 Acute on chronic diastolic (congestive) heart failure: Secondary | ICD-10-CM | POA: Diagnosis not present

## 2018-07-02 DIAGNOSIS — E785 Hyperlipidemia, unspecified: Secondary | ICD-10-CM | POA: Diagnosis present

## 2018-07-02 DIAGNOSIS — I5043 Acute on chronic combined systolic (congestive) and diastolic (congestive) heart failure: Secondary | ICD-10-CM | POA: Diagnosis present

## 2018-07-02 DIAGNOSIS — Z95810 Presence of automatic (implantable) cardiac defibrillator: Secondary | ICD-10-CM

## 2018-07-02 DIAGNOSIS — G4733 Obstructive sleep apnea (adult) (pediatric): Secondary | ICD-10-CM | POA: Diagnosis not present

## 2018-07-02 DIAGNOSIS — I2510 Atherosclerotic heart disease of native coronary artery without angina pectoris: Secondary | ICD-10-CM

## 2018-07-02 DIAGNOSIS — I5023 Acute on chronic systolic (congestive) heart failure: Secondary | ICD-10-CM | POA: Diagnosis present

## 2018-07-02 DIAGNOSIS — Z23 Encounter for immunization: Secondary | ICD-10-CM

## 2018-07-02 DIAGNOSIS — I428 Other cardiomyopathies: Secondary | ICD-10-CM

## 2018-07-02 DIAGNOSIS — I350 Nonrheumatic aortic (valve) stenosis: Principal | ICD-10-CM | POA: Diagnosis present

## 2018-07-02 DIAGNOSIS — I493 Ventricular premature depolarization: Secondary | ICD-10-CM | POA: Diagnosis present

## 2018-07-02 DIAGNOSIS — F05 Delirium due to known physiological condition: Secondary | ICD-10-CM | POA: Diagnosis not present

## 2018-07-02 DIAGNOSIS — J9811 Atelectasis: Secondary | ICD-10-CM | POA: Diagnosis not present

## 2018-07-02 DIAGNOSIS — R0600 Dyspnea, unspecified: Secondary | ICD-10-CM

## 2018-07-02 DIAGNOSIS — I340 Nonrheumatic mitral (valve) insufficiency: Secondary | ICD-10-CM | POA: Diagnosis not present

## 2018-07-02 DIAGNOSIS — R531 Weakness: Secondary | ICD-10-CM | POA: Diagnosis not present

## 2018-07-02 DIAGNOSIS — E7849 Other hyperlipidemia: Secondary | ICD-10-CM

## 2018-07-02 LAB — CBC WITH DIFFERENTIAL/PLATELET
ABS IMMATURE GRANULOCYTES: 0 10*3/uL (ref 0.0–0.1)
BASOS ABS: 0.1 10*3/uL (ref 0.0–0.1)
Basophils Relative: 1 %
EOS PCT: 8 %
Eosinophils Absolute: 0.5 10*3/uL (ref 0.0–0.7)
HCT: 37.7 % — ABNORMAL LOW (ref 39.0–52.0)
HEMOGLOBIN: 13.1 g/dL (ref 13.0–17.0)
Immature Granulocytes: 0 %
LYMPHS PCT: 21 %
Lymphs Abs: 1.3 10*3/uL (ref 0.7–4.0)
MCH: 33.7 pg (ref 26.0–34.0)
MCHC: 34.7 g/dL (ref 30.0–36.0)
MCV: 96.9 fL (ref 78.0–100.0)
MONOS PCT: 11 %
Monocytes Absolute: 0.7 10*3/uL (ref 0.1–1.0)
NEUTROS ABS: 3.6 10*3/uL (ref 1.7–7.7)
Neutrophils Relative %: 59 %
Platelets: 125 10*3/uL — ABNORMAL LOW (ref 150–400)
RBC: 3.89 MIL/uL — ABNORMAL LOW (ref 4.22–5.81)
RDW: 14.4 % (ref 11.5–15.5)
WBC: 6.2 10*3/uL (ref 4.0–10.5)

## 2018-07-02 LAB — BASIC METABOLIC PANEL
Anion gap: 8 (ref 5–15)
BUN: 14 mg/dL (ref 8–23)
CHLORIDE: 105 mmol/L (ref 98–111)
CO2: 24 mmol/L (ref 22–32)
CREATININE: 0.97 mg/dL (ref 0.61–1.24)
Calcium: 8.6 mg/dL — ABNORMAL LOW (ref 8.9–10.3)
GFR calc Af Amer: >60 mL/min (ref >60)
GFR calc non Af Amer: >60 mL/min (ref >60)
Glucose, Bld: 119 mg/dL — ABNORMAL HIGH (ref 70–99)
POTASSIUM: 3.7 mmol/L (ref 3.5–5.1)
Sodium: 137 mmol/L (ref 135–145)

## 2018-07-02 LAB — MAGNESIUM: MAGNESIUM: 2.1 mg/dL (ref 1.7–2.4)

## 2018-07-02 LAB — BRAIN NATRIURETIC PEPTIDE: B Natriuretic Peptide: 696.8 pg/mL — ABNORMAL HIGH (ref 0.0–100.0)

## 2018-07-02 MED ORDER — FUROSEMIDE 10 MG/ML IJ SOLN
40.0000 mg | Freq: Two times a day (BID) | INTRAMUSCULAR | Status: DC
  Administered 2018-07-02 – 2018-07-04 (×4): 40 mg via INTRAVENOUS
  Filled 2018-07-02 (×4): qty 4

## 2018-07-02 MED ORDER — SODIUM CHLORIDE 0.9 % IV SOLN: 250.0000 mL | INTRAVENOUS | Status: DC | PRN

## 2018-07-02 MED ORDER — INFLUENZA VAC SPLIT HIGH-DOSE 0.5 ML IM SUSY
0.5000 mL | PREFILLED_SYRINGE | INTRAMUSCULAR | Status: AC
  Administered 2018-07-03: 0.5 mL via INTRAMUSCULAR
  Filled 2018-07-02: qty 0.5

## 2018-07-02 MED ORDER — SODIUM CHLORIDE 0.9% FLUSH: 3.0000 mL | INTRAVENOUS | Status: DC | PRN

## 2018-07-02 MED ORDER — METOPROLOL SUCCINATE ER 50 MG PO TB24
50.0000 mg | ORAL_TABLET | Freq: Every day | ORAL | Status: DC
  Administered 2018-07-02 – 2018-07-07 (×6): 50 mg via ORAL
  Filled 2018-07-02 (×6): qty 1

## 2018-07-02 MED ORDER — ACETAMINOPHEN 325 MG PO TABS
650.0000 mg | ORAL_TABLET | ORAL | Status: DC | PRN
  Administered 2018-07-04: 650 mg via ORAL
  Filled 2018-07-02: qty 2

## 2018-07-02 MED ORDER — SODIUM CHLORIDE 0.9% FLUSH
3.0000 mL | Freq: Two times a day (BID) | INTRAVENOUS | Status: DC
  Administered 2018-07-02 – 2018-07-06 (×8): 3 mL via INTRAVENOUS

## 2018-07-02 MED ORDER — EZETIMIBE-SIMVASTATIN 10-20 MG PO TABS
1.0000 | ORAL_TABLET | Freq: Every day | ORAL | Status: DC
  Administered 2018-07-02 – 2018-07-06 (×5): 1 via ORAL
  Filled 2018-07-02 (×6): qty 1

## 2018-07-02 MED ORDER — ONDANSETRON HCL 4 MG/2ML IJ SOLN: 4.0000 mg | Freq: Four times a day (QID) | INTRAMUSCULAR | Status: DC | PRN

## 2018-07-02 MED ORDER — HEPARIN SODIUM (PORCINE) 5000 UNIT/ML IJ SOLN
5000.0000 [IU] | Freq: Three times a day (TID) | INTRAMUSCULAR | Status: DC
  Administered 2018-07-02 – 2018-07-06 (×11): 5000 [IU] via SUBCUTANEOUS
  Filled 2018-07-02 (×11): qty 1

## 2018-07-02 MED ORDER — AMIODARONE HCL 100 MG PO TABS
100.0000 mg | ORAL_TABLET | Freq: Every day | ORAL | Status: DC
  Administered 2018-07-02 – 2018-07-07 (×6): 100 mg via ORAL
  Filled 2018-07-02 (×6): qty 1

## 2018-07-02 NOTE — Patient Instructions (Signed)
PLEASE GO TO [facility] TO THE ADMITTING AND THEY WILL GET YOU TO YOUR BED.

## 2018-07-02 NOTE — Plan of Care (Signed)
  Problem: Clinical Measurements: Goal: Ability to maintain clinical measurements within normal limits will improve Outcome: Progressing   Problem: Clinical Measurements: Goal: Will remain free from infection Outcome: Progressing   Problem: Clinical Measurements: Goal: Respiratory complications will improve Outcome: Progressing   Problem: Education: Goal: Knowledge of General Education information will improve Description Including pain rating scale, medication(s)/side effects and non-pharmacologic comfort measures Outcome: Progressing

## 2018-07-03 ENCOUNTER — Inpatient Hospital Stay (HOSPITAL_COMMUNITY): Payer: Medicare Other

## 2018-07-03 DIAGNOSIS — I340 Nonrheumatic mitral (valve) insufficiency: Secondary | ICD-10-CM

## 2018-07-03 DIAGNOSIS — I350 Nonrheumatic aortic (valve) stenosis: Secondary | ICD-10-CM

## 2018-07-03 DIAGNOSIS — I2510 Atherosclerotic heart disease of native coronary artery without angina pectoris: Secondary | ICD-10-CM

## 2018-07-03 DIAGNOSIS — I5033 Acute on chronic diastolic (congestive) heart failure: Secondary | ICD-10-CM

## 2018-07-03 DIAGNOSIS — I472 Ventricular tachycardia: Secondary | ICD-10-CM

## 2018-07-03 DIAGNOSIS — I428 Other cardiomyopathies: Secondary | ICD-10-CM

## 2018-07-03 LAB — ECHOCARDIOGRAM COMPLETE
Height: 70 in
Weight: 3717.84 oz

## 2018-07-03 LAB — BASIC METABOLIC PANEL
ANION GAP: 10 (ref 5–15)
BUN: 12 mg/dL (ref 8–23)
CO2: 29 mmol/L (ref 22–32)
Calcium: 8.9 mg/dL (ref 8.9–10.3)
Chloride: 101 mmol/L (ref 98–111)
Creatinine, Ser: 1.11 mg/dL (ref 0.61–1.24)
GFR calc Af Amer: >60 mL/min (ref >60)
GFR, EST NON AFRICAN AMERICAN: 57 mL/min — AB (ref >60)
GLUCOSE: 141 mg/dL — AB (ref 70–99)
Potassium: 3.7 mmol/L (ref 3.5–5.1)
Sodium: 140 mmol/L (ref 135–145)

## 2018-07-03 MED ORDER — POTASSIUM CHLORIDE CRYS ER 20 MEQ PO TBCR
20.0000 meq | EXTENDED_RELEASE_TABLET | Freq: Two times a day (BID) | ORAL | Status: DC
  Administered 2018-07-03 – 2018-07-07 (×9): 20 meq via ORAL
  Filled 2018-07-03 (×9): qty 1

## 2018-07-03 MED ORDER — POLYETHYLENE GLYCOL 3350 17 G PO PACK
17.0000 g | PACK | Freq: Every day | ORAL | Status: DC
  Administered 2018-07-03 – 2018-07-04 (×2): 17 g via ORAL
  Filled 2018-07-03 (×3): qty 1

## 2018-07-03 NOTE — Progress Notes (Signed)
  Echocardiogram 2D Echocardiogram has been performed.  William [name] F 07/03/2018, 12:54 PM

## 2018-07-03 NOTE — Care Management Note (Signed)
Case Management Note  Patient Details  Name: William [name] MRN: [id number] Date of Birth: 04/12/1928  Subjective/Objective:    CHF               Action/Plan: Patient lives at home with spouse, has a private caregiver; PCP:  Tisovec, Fransico Him, MD; has private insurance with Medicare; pharmacy of choice is [facility]; is active with [facility] for [facility] services; DME - wheelchair and walker at home; CM will continue to follow for progression of care.  Expected Discharge Date:    possibly 07/07/2018              Expected Discharge Plan:  [place]  In-House Referral:   [facility] program for CHF  Discharge planning Services  CM Consult  HH Arranged:  RN, Disease Management, PT, Nurse's Aide [place] Agency:  [facility] ([facility]  Status of Service:  In process, will continue to follow  Sherrilyn Rist [phone] 07/03/2018, 3:16 PM

## 2018-07-03 NOTE — Progress Notes (Signed)
Dr. Sallyanne Kuster asked me to assist with arranging cath - has been set up for 7:30am on Monday with Dr. Claiborne Billings. Orders written per our discussion, with KVO of fluids given HF this admission. He reviewed procedure at length with patient and wife. Lenny Fiumara PA-C

## 2018-07-03 NOTE — Progress Notes (Signed)
Progress Note  Patient Name: William [name] Date of Encounter: 07/03/2018  Primary Cardiologist: Sanda Klein, MD   Subjective   Reports improvement since yesterday.  Short-term memory remains a problem so it is hard to get a reliable review of systems.  He says his breathing is just fine, but he is speaking in interrupted sentences.  2.9 L net diuresis overnight. Weights obtained in different locations, but appeared to show a 3 kg reduction in weight, well below previously estimated dry weight.  We will has leg edema.  Denies orthopnea.  Inpatient Medications    Scheduled Meds: . amiodarone  100 mg Oral Daily  . ezetimibe-simvastatin  1 tablet Oral QHS  . furosemide  40 mg Intravenous BID  . heparin  5,000 Units Subcutaneous Q8H  . metoprolol succinate  50 mg Oral Daily  . sodium chloride flush  3 mL Intravenous Q12H   Continuous Infusions: . sodium chloride     PRN Meds: sodium chloride, acetaminophen, ondansetron (ZOFRAN) IV, sodium chloride flush   Vital Signs    Vitals:   07/02/18 2133 07/02/18 2359 07/03/18 0452 07/03/18 0851  BP: (!) 112/96 119/87 102/75 107/67  Pulse: (!) 102 99 71 77  Resp:  17 16   Temp:  98.4 F (36.9 C) 98 F (36.7 C)   TempSrc:  Oral    SpO2:  97% 95% (!) 76%  Weight:   105.4 kg   Height:        Intake/Output Summary (Last 24 hours) at 07/03/2018 1052 Last data filed at 07/03/2018 0945 Gross per 24 hour  Intake 243 ml  Output 3000 ml  Net -2757 ml   Filed Weights   07/02/18 1738 07/03/18 0452  Weight: 108.5 kg 105.4 kg    Telemetry    Sinus rhythm- Personally Reviewed  ECG    Sinus rhythm with very frequent PVCs, with nonspecific IVCD, QRS 136 ms, QTC 521 ms- Personally Reviewed  Physical Exam  Obese, sitting up at 30 degree head of bed elevation looking relatively comfortable GEN: No acute distress.   Neck: No JVD Cardiac: RRR frequent ectopy, widely split S2, no murmurs, rubs, or gallops.  The subclavian  defibrillator site Respiratory: Clear to auscultation bilaterally. GI: Soft, nontender, non-distended  MS: No edema; No deformity. Neuro:  Nonfocal  Psych: Normal affect   Labs    Chemistry Recent Labs  Lab 07/02/18 1819 07/03/18 0641  NA 137 140  K 3.7 3.7  CL 105 101  CO2 24 29  GLUCOSE 119* 141*  BUN 14 12  CREATININE 0.97 1.11  CALCIUM 8.6* 8.9  GFRNONAA >60 57*  GFRAA >60 >60  ANIONGAP 8 10     Hematology Recent Labs  Lab 07/02/18 1819  WBC 6.2  RBC 3.89*  HGB 13.1  HCT 37.7*  MCV 96.9  MCH 33.7  MCHC 34.7  RDW 14.4  PLT 125*    Cardiac EnzymesNo results for input(s): TROPONINI in the last 168 hours. No results for input(s): TROPIPOC in the last 168 hours.   BNP Recent Labs  Lab 07/02/18 1819  BNP 696.8*     DDimer No results for input(s): DDIMER in the last 168 hours.   Radiology    Dg Chest 2 View  Result Date: 07/03/2018 CLINICAL DATA:  Dyspnea EXAM: CHEST - 2 VIEW COMPARISON:  01/12/2014 FINDINGS: Cardiac shadow is enlarged. Defibrillator is again noted and stable. No focal infiltrate or sizable effusion is noted. Mild bibasilar atelectasis is seen. No acute  bony abnormality is noted. IMPRESSION: Mild bibasilar atelectasis. Electronically Signed   By: Inez Catalina M.D.   On: 07/03/2018 08:39    Cardiac Studies   pending  Patient Profile     84 y.o. male with mild nonischemic cardiomyopathy and mild nonobstructive CAD and a history of sustained ventricular tachycardia on chronic amiodarone therapy, admitted with dyspnea at rest and leg edema.  Assessment & Plan    1.  Acute on chronic diastolic heart failure: To reassess left ventricular ejection fraction.  Responding well to diuretics but still appears volume overloaded.  Normal renal function, borderline low potassium will increase supplement. 2.  Nonischemic cardiomyopathy: Most recent EF 50%, need to reassess 3.  Nonobstructive CAD: Denies angina pectoris 4. VT: Has had frequent  nonsustained and even episodes of sustained VT recorded by his defibrillator.  Attempts to wean off amiodarone have consistently led to recurrence of the arrhythmia.  Frequent PVCs seen on admission EKG, but currently with less ectopy on monitor.  For questions or updates, please contact [place] Please consult [email or web address] for contact info under        Signed, Sanda Klein, MD  07/03/2018, 10:52 AM

## 2018-07-03 NOTE — Progress Notes (Signed)
Echo shows marked reduction in LV function. There is substantial dyssynchrony of contraction (baseline atypical IVCD). There is at least moderate AS. Dimensionless index was calculated 0.14, but I am concerned that there is underestimation of LVOT velocity (PW sample volume too far back in LV), The AV gradients are reported on a post PVC beat and are exaggerated. The average AV gradients are lower. While low gradient severe AS may be present, need to exclude progression of previously nonobstructive CAD and consider progression of known nonischemic CMP. After appropriate diuresis, plan R and L heart cath on Monday. This procedure has been fully reviewed with the patient and written informed consent has been obtained. Briefly reviewed option for TAVR, if severe AS is confirmed (might need TEE, reluctant to do dobutamine echo with hixtory of VT).  Sanda Klein, MD, [facility], [place] [facility] [phone] office [phone] pager

## 2018-07-04 DIAGNOSIS — I5023 Acute on chronic systolic (congestive) heart failure: Secondary | ICD-10-CM

## 2018-07-04 LAB — BASIC METABOLIC PANEL
Anion gap: 12 (ref 5–15)
BUN: 21 mg/dL (ref 8–23)
CALCIUM: 9 mg/dL (ref 8.9–10.3)
CO2: 28 mmol/L (ref 22–32)
Chloride: 101 mmol/L (ref 98–111)
Creatinine, Ser: 1.27 mg/dL — ABNORMAL HIGH (ref 0.61–1.24)
GFR, EST AFRICAN AMERICAN: 56 mL/min — AB (ref >60)
GFR, EST NON AFRICAN AMERICAN: 49 mL/min — AB (ref >60)
GLUCOSE: 149 mg/dL — AB (ref 70–99)
POTASSIUM: 3.8 mmol/L (ref 3.5–5.1)
SODIUM: 141 mmol/L (ref 135–145)

## 2018-07-04 MED ORDER — FUROSEMIDE 40 MG PO TABS
40.0000 mg | ORAL_TABLET | Freq: Every day | ORAL | Status: DC
  Filled 2018-07-04: qty 1

## 2018-07-04 NOTE — Progress Notes (Signed)
Progress Note  Patient Name: William [name] Date of Encounter: 07/04/2018  Primary Cardiologist: Sanda Klein, MD   Subjective   Well today.  Currently not on oxygen and speaking in full sentences.  Does not feel much in the way of shortness of breath.  Inpatient Medications    Scheduled Meds: . amiodarone  100 mg Oral Daily  . ezetimibe-simvastatin  1 tablet Oral QHS  . furosemide  40 mg Intravenous BID  . heparin  5,000 Units Subcutaneous Q8H  . metoprolol succinate  50 mg Oral Daily  . polyethylene glycol  17 g Oral Daily  . potassium chloride  20 mEq Oral BID  . sodium chloride flush  3 mL Intravenous Q12H   Continuous Infusions: . sodium chloride     PRN Meds: sodium chloride, acetaminophen, ondansetron (ZOFRAN) IV, sodium chloride flush   Vital Signs    Vitals:   07/03/18 0851 07/03/18 1311 07/03/18 1946 07/04/18 0450  BP: 107/67 106/64 104/86 100/76  Pulse: 77 86 82 67  Resp:  18 19 19   Temp:  98.3 F (36.8 C) 98.4 F (36.9 C) 97.7 F (36.5 C)  TempSrc:  Oral Oral Oral  SpO2: (!) 76% 98% 98% 98%  Weight:    46.6 kg  Height:        Intake/Output Summary (Last 24 hours) at 07/04/2018 1001 Last data filed at 07/04/2018 0449 Gross per 24 hour  Intake 180 ml  Output 1854 ml  Net -1674 ml   Filed Weights   07/02/18 1738 07/03/18 0452 07/04/18 0450  Weight: 108.5 kg 105.4 kg 46.6 kg    Telemetry    Sinus rhythm with multiple PVCs- Personally Reviewed  ECG    Rhythm with frequent PVCs, nonspecific interventricular conduction delay- Personally Reviewed  Physical Exam   GEN: Well nourished, well developed, in no acute distress  HEENT: normal  Neck: no JVD, carotid bruits, or masses Cardiac: iRRR; no murmurs, rubs, or gallops,no edema  Respiratory:  clear to auscultation bilaterally, normal work of breathing GI: soft, nontender, nondistended, + BS MS: no deformity or atrophy  Skin: warm and dry, device site well healed Neuro:  Strength  and sensation are intact Psych: euthymic mood, full affect   Labs    Chemistry Recent Labs  Lab 07/02/18 1819 07/03/18 0641 07/04/18 0555  NA 137 140 141  K 3.7 3.7 3.8  CL 105 101 101  CO2 24 29 28   GLUCOSE 119* 141* 149*  BUN 14 12 21   CREATININE 0.97 1.11 1.27*  CALCIUM 8.6* 8.9 9.0  GFRNONAA >60 57* 49*  GFRAA >60 >60 56*  ANIONGAP 8 10 12      Hematology Recent Labs  Lab 07/02/18 1819  WBC 6.2  RBC 3.89*  HGB 13.1  HCT 37.7*  MCV 96.9  MCH 33.7  MCHC 34.7  RDW 14.4  PLT 125*    Cardiac EnzymesNo results for input(s): TROPONINI in the last 168 hours. No results for input(s): TROPIPOC in the last 168 hours.   BNP Recent Labs  Lab 07/02/18 1819  BNP 696.8*     DDimer No results for input(s): DDIMER in the last 168 hours.   Radiology    Dg Chest 2 View  Result Date: 07/03/2018 CLINICAL DATA:  Dyspnea EXAM: CHEST - 2 VIEW COMPARISON:  01/12/2014 FINDINGS: Cardiac shadow is enlarged. Defibrillator is again noted and stable. No focal infiltrate or sizable effusion is noted. Mild bibasilar atelectasis is seen. No acute bony abnormality is noted. IMPRESSION:  Mild bibasilar atelectasis. Electronically Signed   By: Inez Catalina M.D.   On: 07/03/2018 08:39    Cardiac Studies   TTE 07/03/18 - Left ventricle: The cavity size was normal. Wall thickness was   increased in a pattern of moderate LVH. Systolic function was   severely reduced. The estimated ejection fraction was in the   range of 20% to 25%. Diffuse hypokinesis. Doppler parameters are   consistent with abnormal left ventricular relaxation (grade 1   diastolic dysfunction). Doppler parameters are consistent with   high ventricular filling pressure. - Aortic valve: Valve mobility was restricted. There was severe   stenosis. Valve area (VTI): 0.76 cm^2. Valve area (Vmax): 0.56   cm^2. Valve area (Vmean): 0.66 cm^2. - Aortic root: The aortic root was mildly dilated. - Mitral valve: Calcified  annulus. Mildly thickened leaflets .   There was mild regurgitation. - Left atrium: The atrium was severely dilated. - Right atrium: The atrium was mildly dilated. - Pulmonary arteries: Systolic pressure was mildly increased. PA   peak pressure: 36 mm Hg (S).  Patient Profile     84 y.o. male with mild nonischemic cardiomyopathy and mild nonobstructive CAD and a history of sustained ventricular tachycardia on chronic amiodarone therapy, admitted with dyspnea at rest and leg edema.  Assessment & Plan    1.  Acute systolic heart failure: Echo yesterday shows acute systolic heart failure with an EF of 20 to 25%.  He is currently being diuresed and is out 4.4 L.  He is to be euvolemic.  We Zanyiah Posten transition his Lasix to oral. 2.  Nonischemic cardiomyopathy: This recent ejection fraction 20 to 25%.  Plan for left heart catheterization on Monday. 3.  Severe aortic stenosis: Found on recent echo.  We Chala Gul plan for left heart catheterization to better assess.  He may be a candidate for TAVR versus medical management depending on coronary disease. 4. VT / PVCs: This is appear to be coming from the inferolateral LV.  Has had frequent nonsustained and sustained VT episodes recorded by his defibrillator.  He has not been able to wean off of amiodarone.  We Neil Errickson continue at the current dose.  Has frequent PVCs on the monitor.    For questions or updates, please contact [place] Please consult [email or web address] for contact info under        Signed, Kristinia Leavy Meredith Leeds, MD  07/04/2018, 10:01 AM

## 2018-07-05 LAB — BASIC METABOLIC PANEL
ANION GAP: 13 (ref 5–15)
BUN: 25 mg/dL — ABNORMAL HIGH (ref 8–23)
CO2: 26 mmol/L (ref 22–32)
Calcium: 9 mg/dL (ref 8.9–10.3)
Chloride: 100 mmol/L (ref 98–111)
Creatinine, Ser: 1.25 mg/dL — ABNORMAL HIGH (ref 0.61–1.24)
GFR, EST AFRICAN AMERICAN: 57 mL/min — AB (ref >60)
GFR, EST NON AFRICAN AMERICAN: 50 mL/min — AB (ref >60)
GLUCOSE: 175 mg/dL — AB (ref 70–99)
POTASSIUM: 3.6 mmol/L (ref 3.5–5.1)
SODIUM: 139 mmol/L (ref 135–145)

## 2018-07-05 MED ORDER — SODIUM CHLORIDE 0.9 % IV SOLN: 250.0000 mL | INTRAVENOUS | Status: DC | PRN

## 2018-07-05 MED ORDER — ASPIRIN 81 MG PO CHEW
81.0000 mg | CHEWABLE_TABLET | ORAL | Status: AC
  Administered 2018-07-06: 81 mg via ORAL
  Filled 2018-07-05: qty 1

## 2018-07-05 MED ORDER — SODIUM CHLORIDE 0.9% FLUSH: 3.0000 mL | Freq: Two times a day (BID) | INTRAVENOUS | Status: DC

## 2018-07-05 MED ORDER — SODIUM CHLORIDE 0.9 % IV SOLN
INTRAVENOUS | Status: DC
  Administered 2018-07-06: 05:00:00 via INTRAVENOUS

## 2018-07-05 MED ORDER — SODIUM CHLORIDE 0.9% FLUSH: 3.0000 mL | INTRAVENOUS | Status: DC | PRN

## 2018-07-05 NOTE — H&P (Signed)
History and all data above reviewed.  Patient examined.  I agree with the findings as above.  All available labs, radiology testing, previous records reviewed. Agree with documented assessment and plan. William [name] is a 64B with chronic systolic and diastolic heart failure, non-obstructive CAD, hyperlipidemia, OSA, AAA, s/p ICD, NSVT who comes to clinic with acute on chronic systolic and diastolic heart failure.  He is short of breath and volume overloaded.  Symptoms have not improved with low dose oral lasix.  He is in mild respiratory distress and has orthopnea.  We will refer him to the hospital for diuresis with IV lasix.   William [name] C. William Linsey, MD, [facility]  07/05/2018 6:29 PM

## 2018-07-05 NOTE — Progress Notes (Addendum)
Progress Note  Patient Name: William [name] Date of Encounter: 07/05/2018  Primary Cardiologist: Sanda Klein, MD   Subjective   Well today.  Creatinine remains stable.  The patient has been mildly confused throughout his hospitalization.  He has some confusion and memory issues at home.  Inpatient Medications    Scheduled Meds: . amiodarone  100 mg Oral Daily  . ezetimibe-simvastatin  1 tablet Oral QHS  . furosemide  40 mg Oral Daily  . heparin  5,000 Units Subcutaneous Q8H  . metoprolol succinate  50 mg Oral Daily  . polyethylene glycol  17 g Oral Daily  . potassium chloride  20 mEq Oral BID  . sodium chloride flush  3 mL Intravenous Q12H   Continuous Infusions: . sodium chloride     PRN Meds: sodium chloride, acetaminophen, ondansetron (ZOFRAN) IV, sodium chloride flush   Vital Signs    Vitals:   07/04/18 1000 07/04/18 1550 07/05/18 0612 07/05/18 0613  BP:  106/79 105/69   Pulse:  (!) 110 98 94  Resp:  18 18   Temp:  (!) 97.3 F (36.3 C) 98 F (36.7 C)   TempSrc:  Oral Oral   SpO2: 98% 96% (!) 84% 97%  Weight:    100 kg  Height:        Intake/Output Summary (Last 24 hours) at 07/05/2018 2426 Last data filed at 07/05/2018 8341 Gross per 24 hour  Intake 600 ml  Output 2200 ml  Net -1600 ml   Filed Weights   07/03/18 0452 07/04/18 0450 07/05/18 9622  Weight: 105.4 kg 46.6 kg 100 kg    Telemetry    Sinus rhythm with multiple PVCs- Personally Reviewed  ECG    None new- Personally Reviewed  Physical Exam   GEN: Well nourished, well developed, in no acute distress  HEENT: normal  Neck: no JVD, carotid bruits, or masses Cardiac: RRR; no murmurs, rubs, or gallops,no edema  Respiratory:  clear to auscultation bilaterally, normal work of breathing GI: soft, nontender, nondistended, + BS MS: no deformity or atrophy  Skin: warm and dry Neuro:  Strength and sensation are intact Psych: euthymic mood, full affect    Labs    Chemistry Recent  Labs  Lab 07/03/18 0641 07/04/18 0555 07/05/18 0520  NA 140 141 139  K 3.7 3.8 3.6  CL 101 101 100  CO2 29 28 26   GLUCOSE 141* 149* 175*  BUN 12 21 25*  CREATININE 1.11 1.27* 1.25*  CALCIUM 8.9 9.0 9.0  GFRNONAA 57* 49* 50*  GFRAA >60 56* 57*  ANIONGAP 10 12 13      Hematology Recent Labs  Lab 07/02/18 1819  WBC 6.2  RBC 3.89*  HGB 13.1  HCT 37.7*  MCV 96.9  MCH 33.7  MCHC 34.7  RDW 14.4  PLT 125*    Cardiac EnzymesNo results for input(s): TROPONINI in the last 168 hours. No results for input(s): TROPIPOC in the last 168 hours.   BNP Recent Labs  Lab 07/02/18 1819  BNP 696.8*     DDimer No results for input(s): DDIMER in the last 168 hours.   Radiology    No results found.  Cardiac Studies   TTE 07/03/18 - Left ventricle: The cavity size was normal. Wall thickness was   increased in a pattern of moderate LVH. Systolic function was   severely reduced. The estimated ejection fraction was in the   range of 20% to 25%. Diffuse hypokinesis. Doppler parameters are   consistent  with abnormal left ventricular relaxation (grade 1   diastolic dysfunction). Doppler parameters are consistent with   high ventricular filling pressure. - Aortic valve: Valve mobility was restricted. There was severe   stenosis. Valve area (VTI): 0.76 cm^2. Valve area (Vmax): 0.56   cm^2. Valve area (Vmean): 0.66 cm^2. - Aortic root: The aortic root was mildly dilated. - Mitral valve: Calcified annulus. Mildly thickened leaflets .   There was mild regurgitation. - Left atrium: The atrium was severely dilated. - Right atrium: The atrium was mildly dilated. - Pulmonary arteries: Systolic pressure was mildly increased. PA   peak pressure: 36 mm Hg (S).  Patient Profile     84 y.o. male with mild nonischemic cardiomyopathy and mild nonobstructive CAD and a history of sustained ventricular tachycardia on chronic amiodarone therapy, admitted with dyspnea at rest and leg  edema.  Assessment & Plan    1.  Acute systolic heart failure: Shows an EF of 20 to 25%.  Currently diuresed and is out 5.9 L.  He has plans for left heart catheterization tomorrow.  We Aikam Vinje hold Lasix today. 2.  Nonischemic cardiomyopathy: Recent ejection fraction down to 20 to 25%.  Plan for left heart catheterization tomorrow. 3.  Severe aortic stenosis: We Ereka Brau assess her left heart catheterization.  Unclear if he is a candidate for further procedures.   4. VT / PVCs: Peers to be from the inferolateral LV.  As frequent nonsustained episodes.  He has been asymptomatic on amiodarone.  No changes. 5.  Confusion: Likely multifactorial with significant sundowning.  Have asked the nurse to get him up to a chair.  He is quite weak and likely Julio Zappia need at least home PT/OT after his hospitalization.  For questions or updates, please contact [place] Please consult [email or web address] for contact info under        Signed, Dorsel Flinn Meredith Leeds, MD  07/05/2018, 8:33 AM

## 2018-07-05 NOTE — H&P (View-Only) (Signed)
Progress Note  Patient Name: William [name] Date of Encounter: 07/05/2018  Primary Cardiologist: Sanda Klein, MD   Subjective   Well today.  Creatinine remains stable.  The patient has been mildly confused throughout his hospitalization.  He has some confusion and memory issues at home.  Inpatient Medications    Scheduled Meds: . amiodarone  100 mg Oral Daily  . ezetimibe-simvastatin  1 tablet Oral QHS  . furosemide  40 mg Oral Daily  . heparin  5,000 Units Subcutaneous Q8H  . metoprolol succinate  50 mg Oral Daily  . polyethylene glycol  17 g Oral Daily  . potassium chloride  20 mEq Oral BID  . sodium chloride flush  3 mL Intravenous Q12H   Continuous Infusions: . sodium chloride     PRN Meds: sodium chloride, acetaminophen, ondansetron (ZOFRAN) IV, sodium chloride flush   Vital Signs    Vitals:   07/04/18 1000 07/04/18 1550 07/05/18 0612 07/05/18 0613  BP:  106/79 105/69   Pulse:  (!) 110 98 94  Resp:  18 18   Temp:  (!) 97.3 F (36.3 C) 98 F (36.7 C)   TempSrc:  Oral Oral   SpO2: 98% 96% (!) 84% 97%  Weight:    100 kg  Height:        Intake/Output Summary (Last 24 hours) at 07/05/2018 6761 Last data filed at 07/05/2018 9509 Gross per 24 hour  Intake 600 ml  Output 2200 ml  Net -1600 ml   Filed Weights   07/03/18 0452 07/04/18 0450 07/05/18 3267  Weight: 105.4 kg 46.6 kg 100 kg    Telemetry    Sinus rhythm with multiple PVCs- Personally Reviewed  ECG    None new- Personally Reviewed  Physical Exam   GEN: Well nourished, well developed, in no acute distress  HEENT: normal  Neck: no JVD, carotid bruits, or masses Cardiac: RRR; no murmurs, rubs, or gallops,no edema  Respiratory:  clear to auscultation bilaterally, normal work of breathing GI: soft, nontender, nondistended, + BS MS: no deformity or atrophy  Skin: warm and dry Neuro:  Strength and sensation are intact Psych: euthymic mood, full affect    Labs    Chemistry Recent  Labs  Lab 07/03/18 0641 07/04/18 0555 07/05/18 0520  NA 140 141 139  K 3.7 3.8 3.6  CL 101 101 100  CO2 29 28 26   GLUCOSE 141* 149* 175*  BUN 12 21 25*  CREATININE 1.11 1.27* 1.25*  CALCIUM 8.9 9.0 9.0  GFRNONAA 57* 49* 50*  GFRAA >60 56* 57*  ANIONGAP 10 12 13      Hematology Recent Labs  Lab 07/02/18 1819  WBC 6.2  RBC 3.89*  HGB 13.1  HCT 37.7*  MCV 96.9  MCH 33.7  MCHC 34.7  RDW 14.4  PLT 125*    Cardiac EnzymesNo results for input(s): TROPONINI in the last 168 hours. No results for input(s): TROPIPOC in the last 168 hours.   BNP Recent Labs  Lab 07/02/18 1819  BNP 696.8*     DDimer No results for input(s): DDIMER in the last 168 hours.   Radiology    No results found.  Cardiac Studies   TTE 07/03/18 - Left ventricle: The cavity size was normal. Wall thickness was   increased in a pattern of moderate LVH. Systolic function was   severely reduced. The estimated ejection fraction was in the   range of 20% to 25%. Diffuse hypokinesis. Doppler parameters are   consistent  with abnormal left ventricular relaxation (grade 1   diastolic dysfunction). Doppler parameters are consistent with   high ventricular filling pressure. - Aortic valve: Valve mobility was restricted. There was severe   stenosis. Valve area (VTI): 0.76 cm^2. Valve area (Vmax): 0.56   cm^2. Valve area (Vmean): 0.66 cm^2. - Aortic root: The aortic root was mildly dilated. - Mitral valve: Calcified annulus. Mildly thickened leaflets .   There was mild regurgitation. - Left atrium: The atrium was severely dilated. - Right atrium: The atrium was mildly dilated. - Pulmonary arteries: Systolic pressure was mildly increased. PA   peak pressure: 36 mm Hg (S).  Patient Profile     84 y.o. male with mild nonischemic cardiomyopathy and mild nonobstructive CAD and a history of sustained ventricular tachycardia on chronic amiodarone therapy, admitted with dyspnea at rest and leg  edema.  Assessment & Plan    1.  Acute systolic heart failure: Shows an EF of 20 to 25%.  Currently diuresed and is out 5.9 L.  He has plans for left heart catheterization tomorrow.  We will hold Lasix today. 2.  Nonischemic cardiomyopathy: Recent ejection fraction down to 20 to 25%.  Plan for left heart catheterization tomorrow. 3.  Severe aortic stenosis: We will assess her left heart catheterization.  Unclear if he is a candidate for further procedures.   4. VT / PVCs: Peers to be from the inferolateral LV.  As frequent nonsustained episodes.  He has been asymptomatic on amiodarone.  No changes. 5.  Confusion: Likely multifactorial with significant sundowning.  Have asked the nurse to get him up to a chair.  He is quite weak and likely will need at least home PT/OT after his hospitalization.  For questions or updates, please contact [place] Please consult [email or web address] for contact info under        Signed, Will Meredith Leeds, MD  07/05/2018, 8:33 AM

## 2018-07-05 NOTE — Progress Notes (Signed)
Consent taken for cardiac cath tomorrow (Wife gave the consent), tried to switch patient to chair from bed but he was not able to switch only he stood up for a little bit and sat in the side of the bed, will continue to monitor the patient  Palma Holter, RN

## 2018-07-05 NOTE — Plan of Care (Signed)
  Problem: Clinical Measurements: Goal: Ability to maintain clinical measurements within normal limits will improve Outcome: Progressing Goal: Will remain free from infection Outcome: Progressing   

## 2018-07-06 ENCOUNTER — Encounter (HOSPITAL_COMMUNITY): Payer: Self-pay | Admitting: Certified Registered"

## 2018-07-06 ENCOUNTER — Encounter (HOSPITAL_COMMUNITY)
Admission: AD | Disposition: A | Discharge status: Home / Self Care | Payer: Self-pay | Source: Home / Self Care | Attending: Cardiovascular Disease

## 2018-07-06 ENCOUNTER — Encounter (HOSPITAL_COMMUNITY): Payer: Self-pay | Admitting: Cardiovascular Disease

## 2018-07-06 DIAGNOSIS — I350 Nonrheumatic aortic (valve) stenosis: Principal | ICD-10-CM

## 2018-07-06 HISTORY — PX: RIGHT/LEFT HEART CATH AND CORONARY ANGIOGRAPHY: CATH118266

## 2018-07-06 LAB — CBC
HEMATOCRIT: 40.2 % (ref 39.0–52.0)
HEMOGLOBIN: 13.8 g/dL (ref 13.0–17.0)
MCH: 33 pg (ref 26.0–34.0)
MCHC: 34.3 g/dL (ref 30.0–36.0)
MCV: 96.2 fL (ref 78.0–100.0)
Platelets: 142 10*3/uL — ABNORMAL LOW (ref 150–400)
RBC: 4.18 MIL/uL — ABNORMAL LOW (ref 4.22–5.81)
RDW: 14 % (ref 11.5–15.5)
WBC: 6.4 10*3/uL (ref 4.0–10.5)

## 2018-07-06 LAB — POCT I-STAT 3, VENOUS BLOOD GAS (G3P V)
ACID-BASE EXCESS: 2 mmol/L (ref 0.0–2.0)
Bicarbonate: 26.1 mmol/L (ref 20.0–28.0)
O2 SAT: 65 %
PCO2 VEN: 39.9 mmHg — AB (ref 44.0–60.0)
TCO2: 27 mmol/L (ref 22–32)
pH, Ven: 7.425 (ref 7.250–7.430)
pO2, Ven: 33 mmHg (ref 32.0–45.0)

## 2018-07-06 LAB — BASIC METABOLIC PANEL
ANION GAP: 10 (ref 5–15)
BUN: 24 mg/dL — AB (ref 8–23)
CO2: 27 mmol/L (ref 22–32)
Calcium: 8.6 mg/dL — ABNORMAL LOW (ref 8.9–10.3)
Chloride: 98 mmol/L (ref 98–111)
Creatinine, Ser: 1.16 mg/dL (ref 0.61–1.24)
GFR calc Af Amer: >60 mL/min (ref >60)
GFR, EST NON AFRICAN AMERICAN: 54 mL/min — AB (ref >60)
Glucose, Bld: 134 mg/dL — ABNORMAL HIGH (ref 70–99)
POTASSIUM: 3.6 mmol/L (ref 3.5–5.1)
SODIUM: 135 mmol/L (ref 135–145)

## 2018-07-06 LAB — POCT I-STAT 3, ART BLOOD GAS (G3+)
ACID-BASE EXCESS: 3 mmol/L — AB (ref 0.0–2.0)
Bicarbonate: 26.8 mmol/L (ref 20.0–28.0)
O2 Saturation: 95 %
TCO2: 28 mmol/L (ref 22–32)
pCO2 arterial: 38.9 mmHg (ref 32.0–48.0)
pH, Arterial: 7.446 (ref 7.350–7.450)
pO2, Arterial: 75 mmHg — ABNORMAL LOW (ref 83.0–108.0)

## 2018-07-06 SURGERY — RIGHT/LEFT HEART CATH AND CORONARY ANGIOGRAPHY
Anesthesia: LOCAL

## 2018-07-06 MED ORDER — LIDOCAINE HCL (PF) 1 % IJ SOLN
INTRAMUSCULAR | Status: AC
  Filled 2018-07-06: qty 30

## 2018-07-06 MED ORDER — MIDAZOLAM HCL 2 MG/2ML IJ SOLN
INTRAMUSCULAR | Status: AC
  Filled 2018-07-06: qty 4

## 2018-07-06 MED ORDER — SODIUM CHLORIDE 0.9% FLUSH: 3.0000 mL | Freq: Two times a day (BID) | INTRAVENOUS | Status: DC

## 2018-07-06 MED ORDER — LIDOCAINE HCL (PF) 1 % IJ SOLN
INTRAMUSCULAR | Status: DC | PRN
  Administered 2018-07-06: 15 mL

## 2018-07-06 MED ORDER — SODIUM CHLORIDE 0.9 % IV SOLN: 250.0000 mL | INTRAVENOUS | Status: DC | PRN

## 2018-07-06 MED ORDER — ONDANSETRON HCL 4 MG/2ML IJ SOLN: 4.0000 mg | Freq: Four times a day (QID) | INTRAMUSCULAR | Status: DC | PRN

## 2018-07-06 MED ORDER — DIAZEPAM 2 MG PO TABS: 2.0000 mg | ORAL_TABLET | Freq: Four times a day (QID) | ORAL | Status: DC | PRN

## 2018-07-06 MED ORDER — SODIUM CHLORIDE 0.9% FLUSH: 3.0000 mL | INTRAVENOUS | Status: DC | PRN

## 2018-07-06 MED ORDER — MIDAZOLAM HCL 2 MG/2ML IJ SOLN
INTRAMUSCULAR | Status: DC | PRN
  Administered 2018-07-06: 0.5 mg via INTRAVENOUS

## 2018-07-06 MED ORDER — SODIUM CHLORIDE 0.9 % IV SOLN
INTRAVENOUS | Status: DC
  Administered 2018-07-06: 23:00:00 via INTRAVENOUS

## 2018-07-06 MED ORDER — IOHEXOL 350 MG/ML SOLN
INTRAVENOUS | Status: DC | PRN
  Administered 2018-07-06: 60 mL via INTRACARDIAC

## 2018-07-06 MED ORDER — HEPARIN SODIUM (PORCINE) 5000 UNIT/ML IJ SOLN
5000.0000 [IU] | Freq: Three times a day (TID) | INTRAMUSCULAR | Status: DC
  Administered 2018-07-07: 5000 [IU] via SUBCUTANEOUS
  Filled 2018-07-06: qty 1

## 2018-07-06 MED ORDER — MIDAZOLAM HCL 2 MG/2ML IJ SOLN
INTRAMUSCULAR | Status: AC
  Filled 2018-07-06: qty 2

## 2018-07-06 MED ORDER — HEPARIN (PORCINE) IN NACL 1000-0.9 UT/500ML-% IV SOLN
INTRAVENOUS | Status: DC | PRN
  Administered 2018-07-06 (×3): 500 mL

## 2018-07-06 MED ORDER — ASPIRIN 81 MG PO CHEW
81.0000 mg | CHEWABLE_TABLET | Freq: Every day | ORAL | Status: DC
  Administered 2018-07-07: 81 mg via ORAL
  Filled 2018-07-06: qty 1

## 2018-07-06 MED ORDER — ACETAMINOPHEN 325 MG PO TABS: 650.0000 mg | ORAL_TABLET | ORAL | Status: DC | PRN

## 2018-07-06 MED ORDER — HEPARIN (PORCINE) IN NACL 1000-0.9 UT/500ML-% IV SOLN
INTRAVENOUS | Status: AC
  Filled 2018-07-06: qty 1000

## 2018-07-06 SURGICAL SUPPLY — 18 items
CATH INFINITI 5FR JL5 (CATHETERS) ×1 IMPLANT
CATH INFINITI 5FR MULTPACK ANG (CATHETERS) ×1 IMPLANT
CATH SWAN GANZ 7F STRAIGHT (CATHETERS) ×1 IMPLANT
KIT HEART LEFT (KITS) ×2 IMPLANT
PACK CARDIAC CATHETERIZATION (CUSTOM PROCEDURE TRAY) ×2 IMPLANT
SHEATH PINNACLE 5F 10CM (SHEATH) ×1 IMPLANT
SHEATH PINNACLE 6F 10CM (SHEATH) ×1 IMPLANT
SHEATH PINNACLE 7F 10CM (SHEATH) ×1 IMPLANT
SHEATH PROBE COVER 6X72 (BAG) ×1 IMPLANT
SYR MEDRAD MARK V 150ML (SYRINGE) ×2 IMPLANT
TRANSDUCER W/STOPCOCK (MISCELLANEOUS) ×3 IMPLANT
TUBING ART PRESS 72  MALE/FEM (TUBING) ×1
TUBING ART PRESS 72 MALE/FEM (TUBING) IMPLANT
TUBING CIL FLEX 10 FLL-RA (TUBING) ×2 IMPLANT
WIRE EMERALD 3MM-J .025X260CM (WIRE) ×1 IMPLANT
WIRE EMERALD 3MM-J .035X150CM (WIRE) ×1 IMPLANT
WIRE EMERALD 3MM-J .035X260CM (WIRE) IMPLANT
WIRE EMERALD ST .035X260CM (WIRE) ×1 IMPLANT

## 2018-07-06 NOTE — Progress Notes (Signed)
PT Cancellation Note  Patient Details Name: CONNELL BOGNAR MRN: [id number] DOB: 03-29-1928   Cancelled Treatment:    Reason Eval/Treat Not Completed: Patient at procedure or test/unavailable(pt at cardiac cath. Will follow. )   Philomena Doheny PT 07/06/2018  Acute Rehabilitation Services Pager [phone] Office [phone]

## 2018-07-06 NOTE — Progress Notes (Signed)
  [facility]  MULTIDISCIPLINARY HEART VALVE TEAM   Dr. Marlou Porch called for a structural heart consult.  I spoke with the wife and she is able to get her husband to an appointment on 07/13/2018 at 4 PM with Dr. Angelena Form to discuss his aortic stenosis and possibility for TAVR.  Appointment information in chart that we will print out at discharge.  Angelena Form PA-C  [facility]

## 2018-07-06 NOTE — Progress Notes (Addendum)
Site area: RFA/RFV Site Prior to Removal:  Level 0 Pressure Applied For:20 min Manual:   yes Patient Status During Pull:  stable Post Pull Site:  Level 0 Post Pull Instructions Given:  yes Post Pull Pulses Present: weak rt DP Dressing Applied:  clear Bedrest begins @ 1100 till 1500 Comments:

## 2018-07-06 NOTE — Progress Notes (Signed)
   Cath reviewed Severe low gradient AS  Dr. Claiborne Billings discussed with wife. Will call TAVR team to discuss further with patient.  Dr. Sallyanne Kuster has briefly discussed in the past with wife of patient who helps make all medical decisions.   Candee Furbish, MD

## 2018-07-06 NOTE — Interval H&P Note (Signed)
Cath Lab Visit (complete for each Cath Lab visit)  Clinical Evaluation Leading to the Procedure:   ACS: No.  Non-ACS:    Anginal Classification: CCS III  Anti-ischemic medical therapy: Minimal Therapy (1 class of medications)  Non-Invasive Test Results: No non-invasive testing performed  Prior CABG: No previous CABG      History and Physical Interval Note:  07/06/2018 9:05 AM  William [name]  has presented today for surgery, with the diagnosis of chf  The various methods of treatment have been discussed with the patient and family. After consideration of risks, benefits and other options for treatment, the patient has consented to  Procedure(s): RIGHT/LEFT HEART CATH AND CORONARY ANGIOGRAPHY (N/A) as a surgical intervention .  The patient's history has been reviewed, patient examined, no change in status, stable for surgery.  I have reviewed the patient's chart and labs.  Questions were answered to the patient's satisfaction.     Shelva Majestic

## 2018-07-07 LAB — CBC
HCT: 38.4 % — ABNORMAL LOW (ref 39.0–52.0)
Hemoglobin: 13.2 g/dL (ref 13.0–17.0)
MCH: 33.7 pg (ref 26.0–34.0)
MCHC: 34.4 g/dL (ref 30.0–36.0)
MCV: 98 fL (ref 78.0–100.0)
Platelets: 131 10*3/uL — ABNORMAL LOW (ref 150–400)
RBC: 3.92 MIL/uL — ABNORMAL LOW (ref 4.22–5.81)
RDW: 14.4 % (ref 11.5–15.5)
WBC: 5.9 10*3/uL (ref 4.0–10.5)

## 2018-07-07 LAB — BASIC METABOLIC PANEL
Anion gap: 10 (ref 5–15)
BUN: 20 mg/dL (ref 8–23)
CALCIUM: 8.5 mg/dL — AB (ref 8.9–10.3)
CO2: 24 mmol/L (ref 22–32)
CREATININE: 1.12 mg/dL (ref 0.61–1.24)
Chloride: 103 mmol/L (ref 98–111)
GFR calc non Af Amer: 57 mL/min — ABNORMAL LOW (ref >60)
Glucose, Bld: 175 mg/dL — ABNORMAL HIGH (ref 70–99)
Potassium: 4.1 mmol/L (ref 3.5–5.1)
SODIUM: 137 mmol/L (ref 135–145)

## 2018-07-07 MED ORDER — ASPIRIN 81 MG PO CHEW: 81.0000 mg | CHEWABLE_TABLET | Freq: Every day | ORAL | 3 refills | Status: DC

## 2018-07-07 NOTE — Plan of Care (Signed)

## 2018-07-07 NOTE — Progress Notes (Addendum)
Pt has orders to be discharged. Discharge instructions given and pt and wife have no additional questions at this time. Medication regimen reviewed and pt educated. Pt's wife verbalized understanding and has no additional questions. Telemetry box removed. IV removed and site in good condition. Pt stable and waiting for transportation.

## 2018-07-07 NOTE — Evaluation (Signed)
Physical Therapy Evaluation Patient Details Name: William [name] MRN: [id number] DOB: Mar 27, 1928 Today's Date: 07/07/2018   History of Present Illness  84yo male with non-ischemic cardiomyopathy, CAD, hx sustained V-tach admitted with dyspnea at rest and LE edema. Cardiac cath done on 07/06/18.   Clinical Impression   Patient received up in chair, working on eating lunch but willing to participate in PT session today. He is able to perform functional transfers with min guard and extended time, cues for safety, as well as ambulation approximately 52f in room with RW, min guard, and Mod cues for safety due to poor safety awareness/poor awareness of use of device. He and his wife report that he is generally at his baseline level of function and that he always has someone with him for safety. Family educated about safety with transfers and safe use of RW during gait. He was left up in the chair with all needs met, family present this afternoon. He will continue to benefit from skilled PT services in the acute setting as well as skilled HHPT services moving forward to further address functional deficits and reduce fall risk.     Follow Up Recommendations Home health PT    Equipment Recommendations  Rolling walker with 5" wheels;3in1 (PT)    Recommendations for Other Services       Precautions / Restrictions Precautions Precautions: Fall;Other (comment) Precaution Comments: HOH  Restrictions Weight Bearing Restrictions: No      Mobility  Bed Mobility               General bed mobility comments: DNT, received up in chair   Transfers Overall transfer level: Needs assistance Equipment used: Rolling walker (2 wheeled) Transfers: Sit to/from Stand Sit to Stand: Min guard         General transfer comment: cues for safety, close min guard for safety and steadying   Ambulation/Gait Ambulation/Gait assistance: Min guard Gait Distance (Feet): 30 Feet Assistive device: Rolling  walker (2 wheeled) Gait Pattern/deviations: Step-through pattern;Decreased step length - right;Decreased step length - left;Decreased stride length;Drifts right/left;Trunk flexed;Narrow base of support     General Gait Details: very poor safety awareness with RW requiring Mod cues for safety and correct use of device; wife and patient report this is his baseline level of function and he always has RW and someone with him for ambulation   Stairs            Wheelchair Mobility    Modified Rankin (Stroke Patients Only)       Balance Overall balance assessment: Needs assistance;History of Falls Sitting-balance support: Feet supported;No upper extremity supported Sitting balance-Leahy Scale: Good     Standing balance support: Bilateral upper extremity supported;During functional activity Standing balance-Leahy Scale: Fair Standing balance comment: reliant on B UE support                              Pertinent Vitals/Pain Pain Assessment: No/denies pain    Home Living Family/patient expects to be discharged to:: Private residence Living Arrangements: Spouse/significant other Available Help at Discharge: Available 24 hours/day Type of Home: House Home Access: Stairs to enter Entrance Stairs-Rails: None Entrance Stairs-Number of Steps: 1 Home Layout: One level Home Equipment: Walker - 2 wheels;Wheelchair - manual      Prior Function Level of Independence: Needs assistance   Gait / Transfers Assistance Needed: always has S when up and ambulating with RW   ADL's / [employer]  Needed: unsure         Hand Dominance        Extremity/Trunk Assessment        Lower Extremity Assessment Lower Extremity Assessment: Generalized weakness    Cervical / Trunk Assessment Cervical / Trunk Assessment: Kyphotic  Communication   Communication: HOH  Cognition Arousal/Alertness: Awake/alert Behavior During Therapy: WFL for tasks  assessed/performed Overall Cognitive Status: Within Functional Limits for tasks assessed                                        General Comments      Exercises     Assessment/Plan    PT Assessment Patient needs continued PT services  PT Problem List Decreased strength;Decreased mobility;Decreased safety awareness;Decreased coordination;Decreased activity tolerance;Decreased balance;Decreased knowledge of use of DME       PT Treatment Interventions DME instruction;Therapeutic activities;Gait training;Therapeutic exercise;Patient/family education;Stair training;Balance training;Functional mobility training;Neuromuscular re-education    PT Goals (Current goals can be found in the Care Plan section)  Acute Rehab PT Goals Patient Stated Goal: to go home  PT Goal Formulation: With patient/family Time For Goal Achievement: 07/21/18 Potential to Achieve Goals: Fair    Frequency Min 3X/week   Barriers to discharge        Co-evaluation               AM-PAC PT "6 Clicks" Daily Activity  Outcome Measure Difficulty turning over in bed (including adjusting bedclothes, sheets and blankets)?: A Little Difficulty moving from lying on back to sitting on the side of the bed? : A Little Difficulty sitting down on and standing up from a chair with arms (e.g., wheelchair, bedside commode, etc,.)?: A Little Help needed moving to and from a bed to chair (including a wheelchair)?: A Little Help needed walking in hospital room?: A Little Help needed climbing 3-5 steps with a railing? : A Lot 6 Click Score: 17    End of Session   Activity Tolerance: Patient tolerated treatment well Patient left: in chair;with call bell/phone within reach;with family/visitor present   PT Visit Diagnosis: Unsteadiness on feet (R26.81);Muscle weakness (generalized) (M62.81);Other abnormalities of gait and mobility (R26.89);History of falling (Z91.81)    Time: 3888-7579 PT Time  Calculation (min) (ACUTE ONLY): 17 min   Charges:   PT Evaluation $PT Eval Moderate Complexity: 1 Mod          Deniece Ree PT, DPT, CBIS  Supplemental Physical Therapist [place]    Pager [phone] Acute Rehab Office [phone]

## 2018-07-07 NOTE — Care Management Important Message (Signed)
Important Message  Patient Details  Name: [name] MRN: [id number] Date of Birth: Sep 30, 1928   Medicare Important Message Given:  Yes    Erenest Rasher, RN 07/07/2018, 3:05 PM

## 2018-07-07 NOTE — Progress Notes (Signed)
Progress Note  Patient Name: William [name] Date of Encounter: 07/07/2018  Primary Cardiologist: Sanda Klein, MD   Subjective   No new complaints.  No chest pain, no shortness of breath.  Resting comfortably in bed.  Discussed with wife.  He utilizes a walker at home.  She wants to make sure that he can get up and transfer effectively before going home.  Ordering PT.  Inpatient Medications    Scheduled Meds: . amiodarone  100 mg Oral Daily  . aspirin  81 mg Oral Daily  . ezetimibe-simvastatin  1 tablet Oral QHS  . heparin  5,000 Units Subcutaneous Q8H  . metoprolol succinate  50 mg Oral Daily  . polyethylene glycol  17 g Oral Daily  . potassium chloride  20 mEq Oral BID  . sodium chloride flush  3 mL Intravenous Q12H   Continuous Infusions: . sodium chloride    . sodium chloride 75 mL/hr at 07/06/18 2300  . sodium chloride     PRN Meds: sodium chloride, sodium chloride, acetaminophen, diazepam, ondansetron (ZOFRAN) IV, sodium chloride flush   Vital Signs    Vitals:   07/06/18 1957 07/06/18 2005 07/06/18 2059 07/07/18 0504  BP:  103/67 104/64 103/76  Pulse: 92   91  Resp:    19  Temp:  98 F (36.7 C)  98 F (36.7 C)  TempSrc: Oral Oral  Oral  SpO2: 98%   99%  Weight:    105 kg  Height:        Intake/Output Summary (Last 24 hours) at 07/07/2018 0928 Last data filed at 07/07/2018 0600 Gross per 24 hour  Intake 2070.46 ml  Output 750 ml  Net 1320.46 ml   Filed Weights   07/05/18 0613 07/06/18 0549 07/07/18 0504  Weight: 100 kg 101.1 kg 105 kg    Telemetry    Frequent PVCs noted.  No significant adverse arrhythmias- Personally Reviewed  ECG    07/07/2018-sinus rhythm with frequent PVCs, right bundle branch block- Personally Reviewed  Physical Exam   GEN: No acute distress.  Elderly Neck: No JVD Cardiac: RRR, 3/6 systolic right upper sternal border murmur, rubs, or gallops.  Catheterization site normal Respiratory: Clear to auscultation  bilaterally. GI: Soft, nontender, non-distended  MS: No edema; No deformity. Neuro:  Nonfocal  Psych: Normal affect   Labs    Chemistry Recent Labs  Lab 07/05/18 0520 07/06/18 0636 07/07/18 0633  NA 139 135 137  K 3.6 3.6 4.1  CL 100 98 103  CO2 26 27 24   GLUCOSE 175* 134* 175*  BUN 25* 24* 20  CREATININE 1.25* 1.16 1.12  CALCIUM 9.0 8.6* 8.5*  GFRNONAA 50* 54* 57*  GFRAA 57* >60 >60  ANIONGAP 13 10 10      Hematology Recent Labs  Lab 07/02/18 1819 07/06/18 0636 07/07/18 0633  WBC 6.2 6.4 5.9  RBC 3.89* 4.18* 3.92*  HGB 13.1 13.8 13.2  HCT 37.7* 40.2 38.4*  MCV 96.9 96.2 98.0  MCH 33.7 33.0 33.7  MCHC 34.7 34.3 34.4  RDW 14.4 14.0 14.4  PLT 125* 142* 131*    Cardiac EnzymesNo results for input(s): TROPONINI in the last 168 hours. No results for input(s): TROPIPOC in the last 168 hours.   BNP Recent Labs  Lab 07/02/18 1819  BNP 696.8*     DDimer No results for input(s): DDIMER in the last 168 hours.   Radiology    No results found.  Cardiac Studies   Echocardiogram 07/03/2018-EF 20 to  25%, severe low gradient aortic stenosis  Cardiac catheterization 07/06/2018- coronary calcification noted, moderate CAD but nothing flow-limiting.  Aortic valve area calculated at 0.7 to 0.9 cm low gradient severe aortic stenosis. Pressures RA: a wave 5, v wave 4; mean 3 RV: 28/5 PA: 26/14; mean 19 PW: A-wave 19, V wave 17; mean 14      Patient Profile     84 y.o. male with severe low gradient aortic stenosis  Assessment & Plan    Severe low gradient aortic stenosis -EF 20 to 25%.  Excellent diuresis throughout hospitalization.  Now euvolemic based upon heart catheterization measurements. -Appreciate TAVR team, Nell Range, PA for setting up appointment on Monday with Dr. Angelena Form to discuss potential TAVR.  Nonischemic cardiomyopathy - Continue with current medical therapy.  Hopefully treatment of aortic stenosis will help as well.  Beta-blocker.   Holding on ACE inhibitor or angiotensin receptor blocker given potential upcoming procedure and continued contrast administration.  Wedge pressure 14.  Ventricular tachycardia nonsustained, PVCs -Asymptomatic on amiodarone.  Dr. Curt Bears with elective physiology has seen.  PVCs only seen on telemetry currently.  Doing well.  Confusion - Likely multifactorial.  Wife makes medical decisions.  Debility - PT ordered.  Want to make sure that it is safe for him to go home.  Utilizes a walker at home.  Wife requested physical therapy see him prior to her taking him home.  If successful, okay to discharge today.  For questions or updates, please contact [place] Please consult [email or web address] for contact info under        Signed, Candee Furbish, MD  07/07/2018, 9:28 AM

## 2018-07-07 NOTE — Progress Notes (Signed)
Patient assisted to chair with one person assist and walker and gait belt.

## 2018-07-07 NOTE — Discharge Summary (Addendum)
Discharge Summary    Patient ID: William [name],  MRN: [id number], DOB/AGE: 1928-03-23 84 y.o.  Admit date: 07/02/2018 Discharge date: 07/07/2018  Primary Care Provider: Haywood Pao Primary Cardiologist: Sanda Klein, MD  Discharge Diagnoses    Principal Problem:   Acute on chronic systolic heart failure [name]) Active Problems:   Nonischemic cardiomyopathy ([place])   Hyperlipidemia   OSA (obstructive sleep apnea) - presumptive diagnosis   Nonrheumatic aortic valve stenosis   Allergies Allergies  Allergen Reactions  . Crestor [Rosuvastatin] Other (See Comments)    Hurts muscles  . Lipitor [Atorvastatin] Other (See Comments)    Hurts stomach    Diagnostic Studies/Procedures    Right/Left heart catheterization 07/06/18:  Dist RCA lesion is 20% stenosed.  Mid LM lesion is 20% stenosed.  Ost LAD lesion is 20% stenosed.  Prox LAD lesion is 30% stenosed.  Mid LAD lesion is 30% stenosed.  Dist Cx lesion is 50% stenosed.  Ost Cx to Prox Cx lesion is 20% stenosed.  Mid Cx lesion is 30% stenosed.   Significant coronary calcification involving all coronary arteries with 20% smooth distal left main stenosis; 20-30% proximal and mid LAD stenoses; 20 and 30% proximal and mid AV groove circumflex stenosis with 50% distal stenosis; and significant calcification of the RCA with mild luminal irregularity and 20% narrowing.  Upper normal right heart pressures.  Severe low gradient aortic valve stenosis in this patient with echocardiographic documentation of an ejection fraction of 20 to 25%.  The peak to peak gradient is 33 mm with a mean gradient of 26 mm and aortic valve area of 0.7 to 0.9 cm2.  Supravalvular aortography demonstrates calcified aortic valve with severely reduced excursion.  The aortic root is mildly dilated.  RECOMMENDATION: William [name] has severe low gradient aortic valve stenosis which may be contributing to his severe LV  dysfunction.  There is significant coronary calcification without high-grade obstructive stenoses.  Depending upon functional status, consider possible surgical evaluation for consideration of aortic valve replacement. Recommend Aspirin 81mg  daily for moderate CAD.  Echocardiogram 07/03/18: Study Conclusions  - Left ventricle: The cavity size was normal. Wall thickness was   increased in a pattern of moderate LVH. Systolic function was   severely reduced. The estimated ejection fraction was in the   range of 20% to 25%. Diffuse hypokinesis. Doppler parameters are   consistent with abnormal left ventricular relaxation (grade 1   diastolic dysfunction). Doppler parameters are consistent with   high ventricular filling pressure. - Aortic valve: Valve mobility was restricted. There was severe   stenosis. Valve area (VTI): 0.76 cm^2. Valve area (Vmax): 0.56   cm^2. Valve area (Vmean): 0.66 cm^2. - Aortic root: The aortic root was mildly dilated. - Mitral valve: Calcified annulus. Mildly thickened leaflets .   There was mild regurgitation. - Left atrium: The atrium was severely dilated. - Right atrium: The atrium was mildly dilated. - Pulmonary arteries: Systolic pressure was mildly increased. PA   peak pressure: 36 mm Hg (S).  Impressions:  - Severe global reduction in LV systolic function; mild diastolic   dysfunction; moderate LVH; mildly dilated aortic root; severe AS;   mild MR; biatrial enlargement; mild TR with mildly elevated   pulmonary pressure. _____________   History of Present Illness     84 y.o. male with a hx of both sustained and nonsustained ventricular tachycardia in the setting of mild nonischemic cardiomyopathy.  He is maintained on amiodarone therapy and has failed  previous attempts to wean down amiodarone.  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  He has a dual-chamber defibrillator in place.  Heart failure medications have not been able to be  titrated. He has chronic complaints of orthostatic dizziness and marginal blood pressures.  At his last visit with Dr. Sallyanne Kuster on 03/12/2018 he reportedly was afraid to walk because he did not want to fall again.  He was encouraged to stay active.  He was maintained on amiodarone at that time.  He needs LFTs and PFTs every 6 months.  He returned for clinic follow-up on 07/02/18 and was seen by Doreene Adas, PA-C.  He presented with his wife.  He c/o progressively worsening shortness of breath over the past month.  Over the past 2 weeks, his shortness of breath and lower extremity swelling have worsened.  He tried to take 20 mg of Lasix daily for 4 days.  This resulted in increased voiding but did not improve his breathing or his lower extremity swelling.  O2 sat in room is 97%; however, he is using accessory muscles and breathing very shallow. He was recommended for admission to [facility] for further evaluation.   Hospital Course     Consultants: None   1. Acute on chronic combined CHF: patient presented with increased SOB not relieved with po lasix at home. Echo this admission with EF 20-25%, G1DD, and severe AS. He was started on IV lasix and diuresed well with net -4.6L UOP this admission. Weight 231 lbs on the day of discharge. He appeared euvolemic at discharge.  - Lasix and ACEi/ARB held on discharge in anticipation of upcoming contrast needs for TAVR work-up outpatient. Would consider starting low dose of both following completion of TAVR work-up - Continue metoprolol XL  2. Severe Aortic stenosis: noted on echo this admission. He underwent Right/Left heart catheterization 07/06/18 which showed severe low gradient aortic valve stenosis with mean gradient of 59mm and peak to peak gradient of 37mm. Supravalvular aortography demonstrated calcified aortic valve with severely reduce excursion and mildly dilated aortic root.  - Scheduled to see Dr. Angelena Form with the structural heart team 07/13/18 for  consideration for TAVR.   3. Non-ischemic cardiomyopathy: echo this admission with EF 20-25%. R/LHC revealed mild-moderate CAD (details above), and he was recommended for ASA 81mg  daily for risk management. He was diuresed as above in #1.  - Continue metoprolol XL   4. History of ventricular tachycardia, nonsustained; PVCs: managed with amiodarone. Patient is asymptomatic - Continue amiodarone  5. Confusion: Felt to be multifactorial, primarily sundowning. Wife serves as health care proxy.   6. Deconditioning: PT evaluated patient and recommended home PT  _____________  Discharge Vitals Blood pressure 103/76, pulse 91, temperature 98 F (36.7 C), temperature source Oral, resp. rate 19, height 5\' 10"  (1.778 m), weight 105 kg, SpO2 99 %.  Filed Weights   07/05/18 0613 07/06/18 0549 07/07/18 0504  Weight: 100 kg 101.1 kg 105 kg    Labs & Radiologic Studies    CBC Recent Labs    07/06/18 0636 07/07/18 0633  WBC 6.4 5.9  HGB 13.8 13.2  HCT 40.2 38.4*  MCV 96.2 98.0  PLT 142* 166*   Basic Metabolic Panel Recent Labs    07/06/18 0636 07/07/18 0633  NA 135 137  K 3.6 4.1  CL 98 103  CO2 27 24  GLUCOSE 134* 175*  BUN 24* 20  CREATININE 1.16 1.12  CALCIUM 8.6* 8.5*   Liver Function Tests No results  for input(s): AST, ALT, ALKPHOS, BILITOT, PROT, ALBUMIN in the last 72 hours. No results for input(s): LIPASE, AMYLASE in the last 72 hours. Cardiac Enzymes No results for input(s): CKTOTAL, CKMB, CKMBINDEX, TROPONINI in the last 72 hours. BNP Invalid input(s): POCBNP D-Dimer No results for input(s): DDIMER in the last 72 hours. Hemoglobin A1C No results for input(s): HGBA1C in the last 72 hours. Fasting Lipid Panel No results for input(s): CHOL, HDL, LDLCALC, TRIG, CHOLHDL, LDLDIRECT in the last 72 hours. Thyroid Function Tests No results for input(s): TSH, T4TOTAL, T3FREE, THYROIDAB in the last 72 hours.  Invalid input(s): FREET3 _____________  Dg Chest 2  View  Result Date: 07/03/2018 CLINICAL DATA:  Dyspnea EXAM: CHEST - 2 VIEW COMPARISON:  01/12/2014 FINDINGS: Cardiac shadow is enlarged. Defibrillator is again noted and stable. No focal infiltrate or sizable effusion is noted. Mild bibasilar atelectasis is seen. No acute bony abnormality is noted. IMPRESSION: Mild bibasilar atelectasis. Electronically Signed   By: Inez Catalina M.D.   On: 07/03/2018 08:39   Disposition   Patient was seen and examined by Dr. Marlou Porch who deemed patient as stable for discharge. Follow-up has been arranged. Discharge medications as listed below.   Follow-up Plans & Appointments    Follow-up Information    [facility] Follow up.   Specialty:  [place] Why:  Home Health Physical Therapy and RN-agency will call to arrange initial visit Contact information: [place] [address] [phone]        Burnell Blanks, MD. Go on 07/13/2018.   Specialty:  Cardiology Why:  please arrive at 3:45 pm for an appointment at 4pm to talk about your heart valve problem Contact information: [place]. [address] [place] [address] [phone]          Discharge Instructions    Diet - low sodium heart healthy   Complete by:  As directed    Increase activity slowly   Complete by:  As directed       Discharge Medications   Allergies as of 07/07/2018      Reactions   Crestor [rosuvastatin] Other (See Comments)   Hurts muscles   Lipitor [atorvastatin] Other (See Comments)   Hurts stomach      Medication List    TAKE these medications   acetaminophen 500 MG tablet Commonly known as:  TYLENOL Take 500 mg by mouth every 6 (six) hours as needed (for pain).   amiodarone 200 MG tablet Commonly known as:  PACERONE TAKE 1/2 TABLET BY MOUTH EVERY DAY   aspirin 81 MG chewable tablet Chew 1 tablet (81 mg total) by mouth daily. Start taking on:  07/08/2018   cholecalciferol 1000 units tablet Commonly  known as:  VITAMIN D Take 2,000 Units by mouth at bedtime.   ezetimibe-simvastatin 10-20 MG tablet Commonly known as:  VYTORIN Take 1 tablet by mouth at bedtime.   loratadine 10 MG tablet Commonly known as:  CLARITIN Take 10 mg by mouth daily as needed for allergies.   metoprolol succinate 50 MG 24 hr tablet Commonly known as:  TOPROL-XL Take 1 tablet (50 mg total) daily by mouth. Take with or immediately following a meal.   polyethylene glycol packet Commonly known as:  MIRALAX / GLYCOLAX Take 17 g by mouth every evening. What changed:  Another medication with the same name was removed. Continue taking this medication, and follow the directions you see here.   PRESERVISION AREDS 2 PO  Take 1 tablet by mouth daily.         Outstanding Labs/Studies   None  Duration of Discharge Encounter   Greater than 30 minutes including physician time.  Signed, Abigail Butts PA-C 07/07/2018, 2:58 PM  Personally seen and examined. Agree with above.  Primary Cardiologist: Sanda Klein, MD   Subjective   No new complaints.  No chest pain, no shortness of breath.  Resting comfortably in bed.  Discussed with wife.  He utilizes a walker at home.  She wants to make sure that he can get up and transfer effectively before going home.  Ordering PT.  Inpatient Medications    Scheduled Meds: . amiodarone  100 mg Oral Daily  . aspirin  81 mg Oral Daily  . ezetimibe-simvastatin  1 tablet Oral QHS  . heparin  5,000 Units Subcutaneous Q8H  . metoprolol succinate  50 mg Oral Daily  . polyethylene glycol  17 g Oral Daily  . potassium chloride  20 mEq Oral BID  . sodium chloride flush  3 mL Intravenous Q12H   Continuous Infusions: . sodium chloride    . sodium chloride 75 mL/hr at 07/06/18 2300  . sodium chloride     PRN Meds: sodium chloride, sodium chloride, acetaminophen, diazepam, ondansetron (ZOFRAN) IV, sodium chloride flush   Vital Signs          Vitals:    07/06/18 1957 07/06/18 2005 07/06/18 2059 07/07/18 0504  BP:  103/67 104/64 103/76  Pulse: 92   91  Resp:    19  Temp:  98 F (36.7 C)  98 F (36.7 C)  TempSrc: Oral Oral  Oral  SpO2: 98%   99%  Weight:    105 kg  Height:        Intake/Output Summary (Last 24 hours) at 07/07/2018 0928 Last data filed at 07/07/2018 0600    Gross per 24 hour  Intake 2070.46 ml  Output 750 ml  Net 1320.46 ml        Filed Weights   07/05/18 0613 07/06/18 0549 07/07/18 0504  Weight: 100 kg 101.1 kg 105 kg    Telemetry    Frequent PVCs noted.  No significant adverse arrhythmias- Personally Reviewed  ECG    07/07/2018-sinus rhythm with frequent PVCs, right bundle branch block- Personally Reviewed  Physical Exam   GEN:No acute distress.  Elderly Neck:No JVD Cardiac:RRR, 3/6 systolic right upper sternal border murmur, rubs, or gallops.  Catheterization site normal Respiratory:Clear to auscultation bilaterally. WG:YKZL, nontender, non-distended  MS:No edema; No deformity. Neuro:Nonfocal  Psych: Normal affect   Labs    Chemistry LastLabs       Recent Labs  Lab 07/05/18 0520 07/06/18 0636 07/07/18 0633  NA 139 135 137  K 3.6 3.6 4.1  CL 100 98 103  CO2 26 27 24   GLUCOSE 175* 134* 175*  BUN 25* 24* 20  CREATININE 1.25* 1.16 1.12  CALCIUM 9.0 8.6* 8.5*  GFRNONAA 50* 54* 57*  GFRAA 57* >60 >60  ANIONGAP 13 10 10        Hematology LastLabs  Recent Labs  Lab 07/02/18 1819 07/06/18 0636 07/07/18 0633  WBC 6.2 6.4 5.9  RBC 3.89* 4.18* 3.92*  HGB 13.1 13.8 13.2  HCT 37.7* 40.2 38.4*  MCV 96.9 96.2 98.0  MCH 33.7 33.0 33.7  MCHC 34.7 34.3 34.4  RDW 14.4 14.0 14.4  PLT 125* 142* 131*      Cardiac Enzymes LastLabs  No results for input(s): TROPONINI in  the last 168 hours.    LastLabs  No results for input(s): TROPIPOC in the last 168 hours.     BNP LastLabs     Recent Labs  Lab 07/02/18 1819  BNP 696.8*        DDimer  LastLabs  No results for input(s): DDIMER in the last 168 hours.     Radiology    ImagingResults(Last48hours)  No results found.    Cardiac Studies   Echocardiogram 07/03/2018-EF 20 to 25%, severe low gradient aortic stenosis  Cardiac catheterization 07/06/2018- coronary calcification noted, moderate CAD but nothing flow-limiting.  Aortic valve area calculated at 0.7 to 0.9 cm low gradient severe aortic stenosis. Pressures RA: a wave 5, v wave 4; mean 3 RV: 28/5 PA: 26/14; mean 19 PW: A-wave 19, V wave 17; mean 14      Patient Profile     84 y.o. male with severe low gradient aortic stenosis  Assessment & Plan    Severe low gradient aortic stenosis -EF 20 to 25%.  Excellent diuresis throughout hospitalization.  Now euvolemic based upon heart catheterization measurements. -Appreciate TAVR team, Nell Range, PA for setting up appointment on Monday with Dr. Angelena Form to discuss potential TAVR.  Nonischemic cardiomyopathy - Continue with current medical therapy.  Hopefully treatment of aortic stenosis will help as well.  Beta-blocker.  Holding on ACE inhibitor or angiotensin receptor blocker given potential upcoming procedure and continued contrast administration.  Wedge pressure 14.  Ventricular tachycardia nonsustained, PVCs -Asymptomatic on amiodarone.  Dr. Curt Bears with elective physiology has seen.  PVCs only seen on telemetry currently.  Doing well.  Confusion - Likely multifactorial.  Wife makes medical decisions.  Debility - PT ordered.  Want to make sure that it is safe for him to go home.  Utilizes a walker at home.  Wife requested physical therapy see him prior to her taking him home.  If successful, okay to discharge today.  For questions or updates, please contact [place] Please consult [email or web address] for contact info under        Signed, Candee Furbish, MD

## 2018-07-07 NOTE — Care Management Important Message (Signed)
Important Message  Patient Details  Name: William [name] MRN: [id number] Date of Birth: [birth year]   Medicare Important Message Given:  Yes    [name] [name] 07/07/2018, 3:30 PM

## 2018-07-07 NOTE — Progress Notes (Addendum)
Contacted [facility] for [facility] resumption of care and dtr requesting a start of care for 07/08/2018. Pt has RW and 3n1 bedside commode. Jonnie Finner RN CCM Case Mgmt phone [phone]

## 2018-07-08 ENCOUNTER — Other Ambulatory Visit: Payer: Self-pay | Admitting: Cardiovascular Disease

## 2018-07-08 MED ORDER — FUROSEMIDE 20 MG PO TABS: 20.0000 mg | ORAL_TABLET | Freq: Every day | ORAL | 3 refills | Status: DC

## 2018-07-08 NOTE — Telephone Encounter (Signed)
New Message:     Patient wife is requesting for a RX for lasix due her husband recent visit at the hospital

## 2018-07-08 NOTE — Telephone Encounter (Signed)
Please resume furosemide 20 mg daily. MCr

## 2018-07-08 NOTE — Consult Note (Signed)
            [facility] CM Primary Care Navigator  12/26/9456  [place] [birth year] [phone]    Went to see patient at the bedside to identify possible discharge needs but he was alreadydischargedper staff. Patient was discharged home with home health services. ([facility])  Per MD note,patientpresented for further evaluation of progressively worsening shortness of breath and worsened lower extremity swelling.(deconditioning, acute and chronic systolic congestive HF, severe aortic stenosis, non-ischemic cardiomyopathy)  Primary care provider's office is listed as providing transition of care (TOC) follow-up.   Patient has discharge instruction to follow-up withcardiology on 07/13/18 and follow-up with primary care provider post hospitalization.  Patient noted with order for EMMI HF calls already in place to follow-up his recovery.   For additional questions please contact:  Edwena Felty A. Willistine Ferrall, BSN, RN-BC [facility] PRIMARY CARE Navigator Cell: [phone]

## 2018-07-08 NOTE — Telephone Encounter (Signed)
Correction-previous note typo  Pt was d/c from his recent hospitalization without a script for Lasix. Pt wife is concerned that he will become volume overloaded again. Dr.C had previous prescribed Lasix 20 daily for 3-4 days. Pt wife would like a new script for Lasix. Adv her that I will fwd an update to Dr.C and we will call back with his response.

## 2018-07-08 NOTE — Telephone Encounter (Signed)
Spoke with pt wife. Pt sts that the ot was not d/c with Lasix and she is concerned that ne will become volume overloaded again. Dr.C had previous prescribed Lasix 20 daily for 3-4 days. Pt wife would like a new script for Lasix. Adv her that I will fwd an update to Dr.C and we will call back with his response.  Pt wife agreeable with plan and verbalized.

## 2018-07-08 NOTE — Telephone Encounter (Signed)
Pt wife aware of Dr.C's recommendation to start Lasix 20mg  daily. Rx sent to pt pharmacy for #90 R-3  Adv pt wife to keep the pt's upcoming appts. Confirmed appt with Dr.Mcalhany for 07/13/18 @ 4pm.  Pt was instructed a hospital d/c to weigh daily. Educated pt wife on the importance of doing so. No further questions at this time. William [name] voiced appreciation for the assistance.

## 2018-07-09 ENCOUNTER — Other Ambulatory Visit: Payer: Self-pay

## 2018-07-09 DIAGNOSIS — H9193 Unspecified hearing loss, bilateral: Secondary | ICD-10-CM | POA: Diagnosis not present

## 2018-07-09 DIAGNOSIS — Z7982 Long term (current) use of aspirin: Secondary | ICD-10-CM | POA: Diagnosis not present

## 2018-07-09 DIAGNOSIS — Z9181 History of falling: Secondary | ICD-10-CM | POA: Diagnosis not present

## 2018-07-09 DIAGNOSIS — J45909 Unspecified asthma, uncomplicated: Secondary | ICD-10-CM | POA: Diagnosis not present

## 2018-07-09 DIAGNOSIS — I472 Ventricular tachycardia: Secondary | ICD-10-CM | POA: Diagnosis not present

## 2018-07-09 DIAGNOSIS — Z95810 Presence of automatic (implantable) cardiac defibrillator: Secondary | ICD-10-CM | POA: Diagnosis not present

## 2018-07-09 DIAGNOSIS — H9190 Unspecified hearing loss, unspecified ear: Secondary | ICD-10-CM | POA: Diagnosis not present

## 2018-07-09 DIAGNOSIS — I2510 Atherosclerotic heart disease of native coronary artery without angina pectoris: Secondary | ICD-10-CM | POA: Diagnosis not present

## 2018-07-09 DIAGNOSIS — I714 Abdominal aortic aneurysm, without rupture: Secondary | ICD-10-CM | POA: Diagnosis not present

## 2018-07-09 DIAGNOSIS — C61 Malignant neoplasm of prostate: Secondary | ICD-10-CM | POA: Diagnosis not present

## 2018-07-09 DIAGNOSIS — G4733 Obstructive sleep apnea (adult) (pediatric): Secondary | ICD-10-CM | POA: Diagnosis not present

## 2018-07-09 DIAGNOSIS — I428 Other cardiomyopathies: Secondary | ICD-10-CM | POA: Diagnosis not present

## 2018-07-09 DIAGNOSIS — I350 Nonrheumatic aortic (valve) stenosis: Secondary | ICD-10-CM | POA: Diagnosis not present

## 2018-07-09 DIAGNOSIS — M1991 Primary osteoarthritis, unspecified site: Secondary | ICD-10-CM | POA: Diagnosis not present

## 2018-07-09 DIAGNOSIS — J45998 Other asthma: Secondary | ICD-10-CM | POA: Diagnosis not present

## 2018-07-09 DIAGNOSIS — I5033 Acute on chronic diastolic (congestive) heart failure: Secondary | ICD-10-CM | POA: Diagnosis not present

## 2018-07-09 NOTE — Patient Outreach (Signed)
[facility]) Care Management  04/21/6282  William [name] [birth year] [phone]   EMMI- heart failure RED ON EMMI ALERT Day # 1 Date: 07/08/18 Red Alert Reason:  Scheduled follow up appointment? no  Outreach attempt: spoke with wife. She is able to verify HIPAA.  Discussed reason for call.  She states she does not remember the call but her daughter may have answered the call. She confirms that patient has an appointment on 07-13-18.  She reports that home health has made contact and that the therapist will be coming today.    Discussed [facility] services.  She declined stating that with home health she thinks they will be fine but is agreeable to receive brochure.     Plan: RN CM will send letter and close case.   Jone Baseman, RN, MSN [facility] Care Management Care Management Coordinator Direct Line [phone] Toll Free: [phone]  Fax: [phone]

## 2018-07-10 ENCOUNTER — Other Ambulatory Visit: Payer: Self-pay

## 2018-07-10 ENCOUNTER — Other Ambulatory Visit: Payer: Self-pay | Admitting: Cardiovascular Disease

## 2018-07-10 NOTE — Patient Outreach (Signed)
[facility]) Care Management  06/16/8332  William [name] [phone]     EMMI-HF RED ON EMMI ALERT Day # 2 Date: 07/09/18 Red Alert Reason: "Weighed themselves today? No"   Outreach attempt # 1 to patient. A male answered the phone and identified herself as patient's housekeeper. She voices that both patient and spouse are busy at this time and unable to talk.        Plan: Assigned RN CM will make outreach attempt to patient within 3-4 business days.    Enzo Montgomery, RN,BSN,CCM [place] [facility] Telephonic Care Management Coordinator Direct Phone: [phone] Toll Free: [phone] Fax: [phone]

## 2018-07-13 ENCOUNTER — Ambulatory Visit (INDEPENDENT_AMBULATORY_CARE_PROVIDER_SITE_OTHER): Payer: Medicare Other | Admitting: Cardiovascular Disease

## 2018-07-13 ENCOUNTER — Other Ambulatory Visit: Payer: Self-pay

## 2018-07-13 ENCOUNTER — Encounter: Payer: Self-pay | Admitting: Cardiovascular Disease

## 2018-07-13 VITALS — BP 118/70 | HR 45 | Ht 70.0 in | Wt 227.0 lb

## 2018-07-13 DIAGNOSIS — C61 Malignant neoplasm of prostate: Secondary | ICD-10-CM | POA: Diagnosis not present

## 2018-07-13 DIAGNOSIS — J45909 Unspecified asthma, uncomplicated: Secondary | ICD-10-CM | POA: Diagnosis not present

## 2018-07-13 DIAGNOSIS — I350 Nonrheumatic aortic (valve) stenosis: Secondary | ICD-10-CM | POA: Diagnosis not present

## 2018-07-13 DIAGNOSIS — I5033 Acute on chronic diastolic (congestive) heart failure: Secondary | ICD-10-CM | POA: Diagnosis not present

## 2018-07-13 DIAGNOSIS — I428 Other cardiomyopathies: Secondary | ICD-10-CM | POA: Diagnosis not present

## 2018-07-13 DIAGNOSIS — I472 Ventricular tachycardia: Secondary | ICD-10-CM | POA: Diagnosis not present

## 2018-07-13 DIAGNOSIS — I2510 Atherosclerotic heart disease of native coronary artery without angina pectoris: Secondary | ICD-10-CM | POA: Diagnosis not present

## 2018-07-13 NOTE — Progress Notes (Signed)
Valve Clinic Consult Note   Chief Complaint  Patient presents with  . New Patient (Initial Visit)    Severe aortic stenosis   History of Present Illness: 84 yo male with history of AAA, arthritis, chronic systolic CHF, non-ischemic cardiomyopathy with ICD in place, ventricular tachycardia, prostate cancer, sleep apnea and severe aortic stenosis referred today as a new consult for further evaluation of severe aortic stenosis and possible TAVR. He has been followed by Dr. Sallyanne Kuster. Recent echo 07/03/18 with LVEF=20-25%. Mean gradient 21 mmHg, peak gradient 53 mmHg. Dimensionless index 0.16. AVA 0.66 cm2. Cardiac cath 07/06/18 with mild non-obstructive CAD. He has a history of sustained ventricular tachycardia treated with amiodarone. He has a dual chamber ICD in place. He tells me today that he has been having progressive dyspnea. He notices dyspnea with minimal exertion. He has lower extremity edema. He still ambulates but only several times per day due to fear of falling. He fell down the stairs 5 years ago and broke his back. He was admitted to [facility] 07/02/18 from the office with volume overload and was diuresed 4.6 liters. He was discharged home on 07/07/18. He has had improvement in his breathing since discharge.   He lives in [place] with his wife. He has 2 daughters who live here in [place]. They are both here today. He goes to the dentist 3 times per year. He has no active tooth infections. 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   Primary Care Physician: Haywood Pao, MD Primary Cardiologist: Cro[place] Referring Cardiologist: Croitoru  Past Medical History:  Diagnosis Date  . AAA (abdominal aortic aneurysm) ([place])    7/13 3.8cm  . Arthritis    "joints" (01/11/2014)  . Asthma    "seasonal; some foods"   . Automatic implantable cardioverter-defibrillator in situ   . Chronic systolic CHF (congestive heart failure) ([place])   . Dyslipidemia   .  Hemorrhage    Post-colonostomy  . HLD (hyperlipidemia)   . Lumbar vertebral fracture (H[facility])    L1- 04/11/2014   . Prostate cancer ([place])   . S/P ICD (internal cardiac defibrillator) procedure, 01/11/14 removal of ERI gen and placement of Medtronic Evera XT VR & NEW Right ventricular lead Medtronic 01/12/2014  . Sleep apnea    "lost 60# & don't have it anymore" (01/11/2014)  . Ventricular tachycardia [facility])     Past Surgical History:  Procedure Laterality Date  . CARDIAC CATHETERIZATION  08/20/2004   noncritical CAD,mild global hypokinesis, EF 50%  . CARDIAC DEFIBRILLATOR PLACEMENT  08/23/2004   Medtronic  . CATARACT EXTRACTION W/ INTRAOCULAR LENS IMPLANT Right   . COLECTOMY  1990's  . CRYOABLATION N/A 02/24/2014   Procedure: CRYO ABLATION PROSTATE;  Surgeon: Ailene Rud, MD;  Location: [facility] ORS;  Service: Urology;  Laterality: N/A;  . HERNIA REPAIR     "abdomen; from colon OR"  . ICD GENERATOR CHANGE  01/11/2014   "upgrade"  . IMPLANTABLE CARDIOVERTER DEFIBRILLATOR (ICD) GENERATOR CHANGE N/A 01/11/2014   Procedure: ICD GENERATOR CHANGE;  Surgeon: Sanda Klein, MD;  Location: [place] CATH LAB;  Service: Cardiovascular;  Laterality: N/A;  . LEAD REVISION N/A 01/11/2014   Procedure: LEAD REVISION;  Surgeon: Sanda Klein, MD;  Location: [place] CATH LAB;  Service: Cardiovascular;  Laterality: N/A;  . NM MYOCAR PERF WALL MOTION  01/29/2012   abnormal c/o infarct/scar,no ischemia present  . PROSTATE BIOPSY N/A 11/22/2013   Procedure: PROSTATE BIOPSY AND ULTRASOUND;  Surgeon: Shane Crutch  Gaynelle Arabian, MD;  Location: [facility] ORS;  Service: Urology;  Laterality: N/A;  . RIGHT/LEFT HEART CATH AND CORONARY ANGIOGRAPHY N/A 07/06/2018   Procedure: RIGHT/LEFT HEART CATH AND CORONARY ANGIOGRAPHY;  Surgeon: Troy Sine, MD;  Location: [place] CV LAB;  Service: Cardiovascular;  Laterality: N/A;    Current Outpatient Medications  Medication Sig Dispense Refill  . acetaminophen (TYLENOL) 500 MG tablet Take 500  mg by mouth every 6 (six) hours as needed (for pain).     Marland Kitchen amiodarone (PACERONE) 200 MG tablet TAKE 1/2 TABLET BY MOUTH EVERY DAY 45 tablet 0  . aspirin 81 MG chewable tablet Chew 1 tablet (81 mg total) by mouth daily. 90 tablet 3  . cholecalciferol (VITAMIN D) 1000 units tablet Take 2,000 Units by mouth at bedtime.    Marland Kitchen ezetimibe-simvastatin (VYTORIN) 10-20 MG per tablet Take 1 tablet by mouth at bedtime.    . furosemide (LASIX) 20 MG tablet Take 1 tablet (20 mg total) by mouth daily. 90 tablet 3  . loratadine (CLARITIN) 10 MG tablet Take 10 mg by mouth daily as needed for allergies.     . metoprolol succinate (TOPROL-XL) 50 MG 24 hr tablet Take 1 tablet (50 mg total) daily by mouth. Take with or immediately following a meal. 90 tablet 3  . Multiple Vitamins-Minerals (PRESERVISION AREDS 2 PO) Take 1 tablet by mouth daily.    . polyethylene glycol (MIRALAX / GLYCOLAX) packet Take 17 g by mouth every evening. 14 each 1  . potassium chloride (K-DUR) 10 MEQ tablet Take 10 mEq by mouth daily.    . Triamcinolone Acetonide (TRIAMCINOLONE 0.1 % CREAM : EUCERIN) CREA Apply 1 application topically as needed for rash or itching.     No current facility-administered medications for this visit.     Allergies  Allergen Reactions  . Crestor [Rosuvastatin] Other (See Comments)    Hurts muscles  . Lipitor [Atorvastatin] Other (See Comments)    Hurts stomach    Social History   Socioeconomic History  . Marital status: Married    Spouse name: Not on file  . Number of children: 2  . Years of education: Not on file  . Highest education level: Not on file  Occupational History  . Occupation: Owned a Scientist, [employer]  Social Needs  . Financial resource strain: Not on file  . Food insecurity:    Worry: Not on file    Inability: Not on file  . Transportation needs:    Medical: Not on file    Non-medical: Not on file  Tobacco Use  . Smoking status: Never Smoker  . Smokeless tobacco: Never  Used  Substance and Sexual Activity  . Alcohol use: Yes    Alcohol/week: 0.0 standard drinks    Comment: 01/11/2014 "drink 1/2 of a beer twice per month   . Drug use: No  . Sexual activity: Not Currently  Lifestyle  . Physical activity:    Days per week: Not on file    Minutes per session: Not on file  . Stress: Not on file  Relationships  . Social connections:    Talks on phone: Not on file    Gets together: Not on file    Attends religious service: Not on file    Active member of club or organization: Not on file    Attends meetings of clubs or organizations: Not on file    Relationship status: Not on file  . Intimate partner violence:    Fear of current  or ex partner: Not on file    Emotionally abused: Not on file    Physically abused: Not on file    Forced sexual activity: Not on file  Other Topics Concern  . Not on file  Social History Narrative  . Not on file    Family History  Problem Relation Age of Onset  . Heart failure Mother        Died of "old age" at 31  . Pneumonia Father     Review of Systems:  As stated in the HPI and otherwise negative.   BP 118/70   Pulse (!) 45   Ht 5\' 10"  (1.778 m)   Wt 227 lb (103 kg)   SpO2 92%   BMI 32.57 kg/m   Physical Examination: General: Well developed, well nourished, NAD  HEENT: OP clear, mucus membranes moist  SKIN: warm, dry. No rashes. Neuro: No focal deficits  Musculoskeletal: Muscle strength 5/5 all ext  Psychiatric: Mood and affect normal  Neck: No JVD, no carotid bruits, no thyromegaly, no lymphadenopathy.  Lungs:Cry crackles bases. no wheezes, rhonci Cardiovascular: Regular rate and rhythm. Loud harsh systolic murmur.  Abdomen:Soft. Bowel sounds present. Non-tender.  Extremities: 1+ bilateral lower extremity edema. Pulses are 2 + in the bilateral DP/PT.  Echo 07/03/18: Left ventricle: The cavity size was normal. Wall thickness was   increased in a pattern of moderate LVH. Systolic function was    severely reduced. The estimated ejection fraction was in the   range of 20% to 25%. Diffuse hypokinesis. Doppler parameters are   consistent with abnormal left ventricular relaxation (grade 1   diastolic dysfunction). Doppler parameters are consistent with   high ventricular filling pressure. - Aortic valve: Valve mobility was restricted. There was severe   stenosis. Valve area (VTI): 0.76 cm^2. Valve area (Vmax): 0.56   cm^2. Valve area (Vmean): 0.66 cm^2. - Aortic root: The aortic root was mildly dilated. - Mitral valve: Calcified annulus. Mildly thickened leaflets .   There was mild regurgitation. - Left atrium: The atrium was severely dilated. - Right atrium: The atrium was mildly dilated. - Pulmonary arteries: Systolic pressure was mildly increased. PA   peak pressure: 36 mm Hg (S).  Impressions:  - Severe global reduction in LV systolic function; mild diastolic   dysfunction; moderate LVH; mildly dilated aortic root; severe AS;   mild MR; biatrial enlargement; mild TR with mildly elevated   pulmonary pressure.  ------------------------------------------------------------------- Left ventricle:  The cavity size was normal. Wall thickness was increased in a pattern of moderate LVH. Systolic function was severely reduced. The estimated ejection fraction was in the range of 20% to 25%. Diffuse hypokinesis. Doppler parameters are consistent with abnormal left ventricular relaxation (grade 1 diastolic dysfunction). Doppler parameters are consistent with high ventricular filling pressure.  ------------------------------------------------------------------- Aortic valve:   Trileaflet; moderately calcified leaflets. Valve mobility was restricted.  Doppler:   There was severe stenosis. There was no regurgitation.    VTI ratio of LVOT to aortic valve: 0.18. Valve area (VTI): 0.76 cm^2. Indexed valve area (VTI): 0.33 cm^2/m^2. Peak velocity ratio of LVOT to aortic valve: 0.14.  Valve area (Vmax): 0.56 cm^2. Indexed valve area (Vmax): 0.24 cm^2/m^2. Mean velocity ratio of LVOT to aortic valve: 0.16. Valve area (Vmean): 0.66 cm^2. Indexed valve area (Vmean): 0.29 cm^2/m^2. Mean gradient (S): 21 mm Hg. Peak gradient (S): 53 mm Hg.  ------------------------------------------------------------------- Aorta:  Aortic root: The aortic root was mildly dilated.  ------------------------------------------------------------------- Mitral valve:   Calcified annulus.  Mildly thickened leaflets . Mobility was not restricted.  Doppler:  Transvalvular velocity was within the normal range. There was no evidence for stenosis. There was mild regurgitation.    Peak gradient (D): 3 mm Hg.  ------------------------------------------------------------------- Left atrium:  The atrium was severely dilated.  ------------------------------------------------------------------- Right ventricle:  The cavity size was normal. Pacer wire or catheter noted in right ventricle. Systolic function was normal.   ------------------------------------------------------------------- Pulmonic valve:    Doppler:  Transvalvular velocity was within the normal range. There was no evidence for stenosis. There was mild regurgitation.  ------------------------------------------------------------------- Tricuspid valve:   Structurally normal valve.    Doppler: Transvalvular velocity was within the normal range. There was mild regurgitation.  ------------------------------------------------------------------- Pulmonary artery:   Systolic pressure was mildly increased.  ------------------------------------------------------------------- Right atrium:  The atrium was mildly dilated. Pacer wire or catheter noted in right atrium.  ------------------------------------------------------------------- Pericardium:  There was no pericardial  effusion.  ------------------------------------------------------------------- Systemic veins: Inferior vena cava: The vessel was normal in size.  ------------------------------------------------------------------- Measurements   Left ventricle                           Value          Reference  LV ID, ED, PLAX chordal           (H)    52.5  mm       43 - 52  LV ID, ES, PLAX chordal           (H)    40.3  mm       23 - 38  LV fx shortening, PLAX chordal    (L)    23    %        >=29  LV PW thickness, ED                      13.76 mm       ----------  IVS/LV PW ratio, ED                      1.06           <=1.3  Stroke volume, 2D                        46    ml       ----------  Stroke volume/bsa, 2D                    20    ml/m^2   ----------  LV e&', lateral                           7.76  cm/s     ----------  LV E/e&', lateral                         10.44          ----------  LV e&', medial                            3.37  cm/s     ----------  LV E/e&', medial                          24.04          ----------  LV e&', average                           5.57  cm/s     ----------  LV E/e&', average                         14.56          ----------  Longitudinal strain, TDI                 4     %        ----------    Ventricular septum                       Value          Reference  IVS thickness, ED                        14.54 mm       ----------    LVOT                                     Value          Reference  LVOT ID, S                               23    mm       ----------  LVOT area                                4.15  cm^2     ----------  LVOT peak velocity, S                    49.4  cm/s     ----------  LVOT mean velocity, S                    33.8  cm/s     ----------  LVOT VTI, S                              11.1  cm       ----------  LVOT peak gradient, S                    1     mm Hg    ----------    Aortic valve                             Value           Reference  Aortic valve peak velocity, S            363   cm/s     ----------  Aortic valve mean velocity, S            212   cm/s     ----------  Aortic valve VTI, S                      60.4  cm       ----------  Aortic  mean gradient, S                  21    mm Hg    ----------  Aortic peak gradient, S                  53    mm Hg    ----------  VTI ratio, LVOT/AV                       0.18           ----------  Aortic valve area, VTI                   0.76  cm^2     ----------  Aortic valve area/bsa, VTI               0.33  cm^2/m^2 ----------  Velocity ratio, peak, LVOT/AV            0.14           ----------  Aortic valve area, peak velocity         0.56  cm^2     ----------  Aortic valve area/bsa, peak              0.24  cm^2/m^2 ----------  velocity  Velocity ratio, mean, LVOT/AV            0.16           ----------  Aortic valve area, mean velocity         0.66  cm^2     ----------  Aortic valve area/bsa, mean              0.29  cm^2/m^2 ----------  velocity    Aorta                                    Value          Reference  Aortic root ID, ED                       38    mm       ----------    Left atrium                              Value          Reference  LA ID, A-P, ES                           54    mm       ----------  LA ID/bsa, A-P                    (H)    2.33  cm/m^2   <=2.2  LA volume, S                             149   ml       ----------  LA volume/bsa, S                         64.3  ml/m^2   ----------  LA volume, ES, 1-p A4C  146   ml       ----------  LA volume/bsa, ES, 1-p A4C               63    ml/m^2   ----------  LA volume, ES, 1-p A2C                   142   ml       ----------  LA volume/bsa, ES, 1-p A2C               61.3  ml/m^2   ----------    Mitral valve                             Value          Reference  Mitral E-wave peak velocity              81    cm/s     ----------  Mitral A-wave peak velocity              81.5  cm/s      ----------  Mitral deceleration time          (L)    120   ms       150 - 230  Mitral peak gradient, D                  3     mm Hg    ----------  Mitral E/A ratio, peak                   1              ----------    Pulmonary arteries                       Value          Reference  PA pressure, S, DP                (H)    36    mm Hg    <=30    Tricuspid valve                          Value          Reference  Tricuspid regurg peak velocity           286   cm/s     ----------  Tricuspid peak RV-RA gradient            33    mm Hg    ----------    Right atrium                             Value          Reference  RA ID, S-I, ES, A4C               (H)    74.1  mm       34 - 49  RA area, ES, A4C                  (H)    30.5  cm^2     8.3 - 19.5  RA volume, ES, A/L  101   ml       ----------  RA volume/bsa, ES, A/L                   43.6  ml/m^2   ----------    Systemic veins                           Value          Reference  Estimated CVP                            3     mm Hg    ----------    Right ventricle                          Value          Reference  RV ID, minor axis, ED, A4C base          37.7  mm       ----------  TAPSE                                    25.3  mm       ----------  RV pressure, S, DP                (H)    36    mm Hg    <=30  RV s&', lateral, S                        10.4  cm/s     ----------   Cardiac cath 07/06/18:  Dist RCA lesion is 20% stenosed.  Mid LM lesion is 20% stenosed.  Ost LAD lesion is 20% stenosed.  Prox LAD lesion is 30% stenosed.  Mid LAD lesion is 30% stenosed.  Dist Cx lesion is 50% stenosed.  Ost Cx to Prox Cx lesion is 20% stenosed.  Mid Cx lesion is 30% stenosed.   Significant coronary calcification involving all coronary arteries with 20% smooth distal left main stenosis; 20-30% proximal and mid LAD stenoses; 20 and 30% proximal and mid AV groove circumflex stenosis with 50% distal stenosis; and  significant calcification of the RCA with mild luminal irregularity and 20% narrowing.  Upper normal right heart pressures.  Severe low gradient aortic valve stenosis in this patient with echocardiographic documentation of an ejection fraction of 20 to 25%.  The peak to peak gradient is 33 mm with a mean gradient of 26 mm and aortic valve area of 0.7 to 0.9 cm2.  Supravalvular aortography demonstrates calcified aortic valve with severely reduced excursion.  The aortic root is mildly dilated.  Diagnostic Diagram         EKG:  EKG is not ordered today. The ekg ordered today demonstrates   Recent Labs: 07/02/2018: B Natriuretic Peptide 696.8; Magnesium 2.1 07/07/2018: BUN 20; Creatinine, Ser 1.12; Hemoglobin 13.2; Platelets 131; Potassium 4.1; Sodium 137   Lipid Panel No results found for: CHOL, TRIG, HDL, CHOLHDL, VLDL, LDLCALC, LDLDIRECT   Wt Readings from Last 3 Encounters:  07/13/18 227 lb (103 kg)  07/07/18 231 lb 7.7 oz (105 kg)  07/02/18 242 lb (109.8 kg)     Other studies Reviewed: Additional studies/ records that were reviewed today include: echo images,  cath images, office notes, hospital notes. Review of the above records demonstrates: severe AS   Assessment and Plan:   1. Severe aortic stenosis: He has severe, stage D aortic valve stenosis. The aortic valve is thickened, calcified with limited leaflet mobility. His mean gradient is only 21 mmHg but his AVA is 0.66cm2 and his dimensionless index is 0.16. He likely has low flow, low gradient AS. I have personally reviewed the echo images. I think he would benefit from AVR. Given advanced age, he is not a good candidate for conventional AVR by surgical approach. I think he may be a good candidate for TAVR.   STS Risk Score: Procedure: Isolated AVR  Risk of Mortality: 3.882%  Renal Failure: 3.488%  Permanent Stroke: 1.593%  Prolonged Ventilation: 13.968%  DSW Infection: 0.180%  Reoperation: 4.329%  Morbidity or  Mortality: 19.540%  Short Length of Stay: 20.095%  Long Length of Stay: 11.254%   I have reviewed the natural history of aortic stenosis with the patient and their family members  who are present today. We have discussed the limitations of medical therapy and the poor prognosis associated with symptomatic aortic stenosis. We have reviewed potential treatment options, including palliative medical therapy, conventional surgical aortic valve replacement, and transcatheter aortic valve replacement. We discussed treatment options in the context of the patient's specific comorbid medical conditions.   He would like to proceed with planning for TAVR but he will call us back to arrange tests. He has already had his cardiac cath. Risks and benefits of the TAVR procedure reviewed with the patient. Once he calls back, we will proceed with arranging the cardiac CT, CTA of the chest/abdomen and pelvis, PFTs,  and will then be referred to see one of the CT surgeons on our TAVR team.    Current medicines are reviewed at length with the patient today.  The patient does not have concerns regarding medicines.  The following changes have been made:  no change  Labs/ tests ordered today include:  No orders of the defined types were placed in this encounter.    Disposition:   FU with the TAVR team.    Signed, Lauree Chandler, MD 07/14/2018 7:55 AM    [address], [place], [place]  [address] Phone: [phone]; Fax: [phone]

## 2018-07-13 NOTE — Patient Outreach (Signed)
[facility]) Care Management  0/23/3435  William [name] [birth year] [id number]   EMMI- Heart Failure RED ON EMMI ALERT Day # 2 Date:  07/09/18 Red Alert Reason:  Weighed them selves? No  Outreach attempt: spoke with wife Webb Silversmith.  She is able to verify HIPAA. Discussed reason for call. She states that he had not weighed on 07/09/18 as they had not decided on the scale they were going to use.  She states that now he is weighing and states they have no problems.  She is thankful for the follow up call and denies any present needs.    Advised that calls would continue to come and if there were any red flags that a nurse would follow up.  She verbalized understanding.     Plan: RN CM will close case at this time.  Jone Baseman, RN, MSN [facility] Care Management Care Management Coordinator Direct Line [phone] Toll Free: [phone]  Fax: [phone]

## 2018-07-15 ENCOUNTER — Other Ambulatory Visit: Payer: Self-pay

## 2018-07-15 DIAGNOSIS — C61 Malignant neoplasm of prostate: Secondary | ICD-10-CM | POA: Diagnosis not present

## 2018-07-15 DIAGNOSIS — J45909 Unspecified asthma, uncomplicated: Secondary | ICD-10-CM | POA: Diagnosis not present

## 2018-07-15 DIAGNOSIS — I350 Nonrheumatic aortic (valve) stenosis: Secondary | ICD-10-CM

## 2018-07-15 DIAGNOSIS — I5033 Acute on chronic diastolic (congestive) heart failure: Secondary | ICD-10-CM | POA: Diagnosis not present

## 2018-07-15 DIAGNOSIS — I2510 Atherosclerotic heart disease of native coronary artery without angina pectoris: Secondary | ICD-10-CM | POA: Diagnosis not present

## 2018-07-15 DIAGNOSIS — R0609 Other forms of dyspnea: Secondary | ICD-10-CM

## 2018-07-15 DIAGNOSIS — I428 Other cardiomyopathies: Secondary | ICD-10-CM | POA: Diagnosis not present

## 2018-07-15 DIAGNOSIS — I472 Ventricular tachycardia: Secondary | ICD-10-CM | POA: Diagnosis not present

## 2018-07-16 ENCOUNTER — Other Ambulatory Visit: Payer: Self-pay

## 2018-07-16 NOTE — Patient Outreach (Signed)
[facility]) Care Management  12/12/2809  Pellegrino Kennard Matteo [birth year] [id number]   EMMI- Heart Failure RED ON EMMI ALERT Day # 8 Date: 07-16-18 Red Alert Reason:   Martin Majestic to follow-up appointment? No  Why they didn't attend follow-up appointment I Missed It    Outreach attempt: spoke with wife.  She is able to verify HIPAA.  She states that patient has not seen the PCP office but will be seeing them for vaccinations.  She states that patient has seen the cardiologist already and is being evaluated for a valve replacement.  She states that patient is doing well and weighing daily.  She declines any needs presently.     Plan: RN CM will close case.    Jone Baseman, RN, MSN [facility] Care Management Care Management Coordinator Direct Line [phone] Toll Free: [phone]  Fax: [phone]

## 2018-07-17 ENCOUNTER — Other Ambulatory Visit: Payer: Self-pay | Admitting: Cardiovascular Disease

## 2018-07-17 ENCOUNTER — Other Ambulatory Visit: Payer: Self-pay

## 2018-07-17 DIAGNOSIS — N390 Urinary tract infection, site not specified: Secondary | ICD-10-CM | POA: Diagnosis not present

## 2018-07-17 NOTE — Patient Outreach (Addendum)
[facility]) Care Management  95/11/8411  William [name] [place] [birth year] [id number]   EMMI- Heart Failure  RED ON EMMI ALERT Day # 9  Date: 07/16/18 Red Alert Reason:  \New/worsening problems? Yes  Outreach attempt: Discussed red alert with wife.  Wife reports that patient is having some cloudy urine at night and that she has a call out to physician office waiting for return call.  She states that his urine is clear during the day and only cloudy at night.  Discussed with wife signs of urinary tract infection.  She verbalized understanding and states she has a urine sample to take to the office when she hears from them.  She denies any blood in urine, pain, or fever.  Advised wife that CM would follow up within 5 business days regarding patient status.  She verbalized understanding and appreciative of call.     Plan: RN CM will follow up within 5 business days.    Jone Baseman, RN, MSN [facility] Care Management Care Management Coordinator Direct Line [phone] Toll Free: [phone]  Fax: [phone]

## 2018-07-20 ENCOUNTER — Other Ambulatory Visit: Payer: Self-pay

## 2018-07-20 NOTE — Patient Outreach (Signed)
[facility]) Care Management  18/05/4165  William [name] [birth year] [id number]   EMMI- Heart Failure RED ON EMMI ALERT Day # 10 Date: 07/17/18 Red Alert Reason:  Any new problems? Yes  New/worsening problems? Yes  Other symptoms/problems? Yes    EMMI- Heart failure RED ON EMMI ALERT Day # 11 Date: 07/18/18 Red Alert Reason:  Weighed themselves today? no New/worsening symptoms? Yes  Outreach attempt: spoke with wife.  She is able to verify HIPAA.  Discussed red alerts.  She states that she did take a sample on Friday to PCP office and patient did have a UTI and that is clearing up.  She states that now he is having some constipation.  She states he had a small bowel movement on Saturday.  She states that he takes miralax daily.  She says she has called the nurse and is waiting for a return call.  She declined the need for ongoing nurse calls but is ok with EMMI calls.     Plan: RN CM will close case.   Jone Baseman, RN, MSN [facility] Care Management Care Management Coordinator Direct Line [phone] Toll Free: [phone]  Fax: [phone]

## 2018-07-21 ENCOUNTER — Other Ambulatory Visit: Payer: Self-pay

## 2018-07-21 DIAGNOSIS — I472 Ventricular tachycardia: Secondary | ICD-10-CM | POA: Diagnosis not present

## 2018-07-21 DIAGNOSIS — J45909 Unspecified asthma, uncomplicated: Secondary | ICD-10-CM | POA: Diagnosis not present

## 2018-07-21 DIAGNOSIS — I428 Other cardiomyopathies: Secondary | ICD-10-CM | POA: Diagnosis not present

## 2018-07-21 DIAGNOSIS — I5033 Acute on chronic diastolic (congestive) heart failure: Secondary | ICD-10-CM | POA: Diagnosis not present

## 2018-07-21 DIAGNOSIS — C61 Malignant neoplasm of prostate: Secondary | ICD-10-CM | POA: Diagnosis not present

## 2018-07-21 DIAGNOSIS — I2510 Atherosclerotic heart disease of native coronary artery without angina pectoris: Secondary | ICD-10-CM | POA: Diagnosis not present

## 2018-07-21 NOTE — Patient Outreach (Signed)
[facility]) Care Management  42/12/9530  Bastien Strawser Pudwill [birth year] [id number]   EMMI- Heart Failure RED ON EMMI ALERT Day # 11 Date: 07/20/18 Red Alert Reason:  New/worsening problem? Yes   EMMI red flag addressed on call 07-20-18.  No outreach warranted today.     Plan: RN CM will close case.    Jone Baseman, RN, MSN [facility] Care Management Care Management Coordinator Direct Line [phone] Toll Free: [phone]  Fax: [phone]

## 2018-07-22 ENCOUNTER — Ambulatory Visit: Payer: Medicare Other

## 2018-07-22 NOTE — Telephone Encounter (Signed)
This encounter was created in error - please disregard.

## 2018-07-23 ENCOUNTER — Other Ambulatory Visit: Payer: Self-pay

## 2018-07-23 DIAGNOSIS — I5033 Acute on chronic diastolic (congestive) heart failure: Secondary | ICD-10-CM | POA: Diagnosis not present

## 2018-07-23 DIAGNOSIS — I2510 Atherosclerotic heart disease of native coronary artery without angina pectoris: Secondary | ICD-10-CM | POA: Diagnosis not present

## 2018-07-23 DIAGNOSIS — I472 Ventricular tachycardia: Secondary | ICD-10-CM | POA: Diagnosis not present

## 2018-07-23 DIAGNOSIS — I428 Other cardiomyopathies: Secondary | ICD-10-CM | POA: Diagnosis not present

## 2018-07-23 DIAGNOSIS — J45909 Unspecified asthma, uncomplicated: Secondary | ICD-10-CM | POA: Diagnosis not present

## 2018-07-23 DIAGNOSIS — C61 Malignant neoplasm of prostate: Secondary | ICD-10-CM | POA: Diagnosis not present

## 2018-07-23 NOTE — Patient Outreach (Signed)
[facility]) Care Management  56/38/7564  Gadge Hermiz Adolph [birth year] [id number]   EMMI- Heart Failure RED ON EMMI ALERT Day # 14 Date: 07/21/18 Red Alert Reason:  Weight 223 lbs  EMMI- Heart Failure RED ON EMMI ALERT Day # 15 Date: 07/22/18 Red Alert Reason:  Weight 224 lbs  Outreach attempt:spoke with wife.  She reports that patient is doing good. She reports that his weight is down this morning to 221 lbs.  She states that he did not have the lasix on Monday due to some constipation but patient is back on track. Discussed with her signs of heart failure, weights, and what a difference missing medication makes.  Also discussed [facility] services again.  She declined needs at this time but wants to continue the daily automated phone calls.     Plan: RN CM will close case at this time.  Jone Baseman, RN, MSN [facility] Care Management Care Management Coordinator Direct Line [phone] Toll Free: [phone]  Fax: [phone]

## 2018-07-24 ENCOUNTER — Other Ambulatory Visit: Payer: Self-pay

## 2018-07-24 NOTE — Patient Outreach (Signed)
[facility]) Care Management  32/99/2426  Araceli Coufal Wiles 8/34/1960 [id number]   EMMI- Heart Failure RED ON EMMI ALERT Day # 16  Date: 07/23/18 Red Alert Reason:   Any new problems? Yes  New/worsening problems? Yes  Had diarrhea or felt sick to stomach? Yes    Outreach attempt: Spoke with wife.  She reports that the call system has not acted right.  She states that yesterday the system did not record her answers correctly yesterday.  She states that patient does not have any new problems and is doing ok.  She states that today's call was also off.  She states that the system hung up on her.  Advised that calls would continue and she is ok with that but declines need for [facility] services at this time.     Plan: RN CM will close case.  Jone Baseman, RN, MSN [facility] Care Management Care Management Coordinator Direct Line [phone] Toll Free: [phone]  Fax: [phone]

## 2018-07-27 DIAGNOSIS — I428 Other cardiomyopathies: Secondary | ICD-10-CM | POA: Diagnosis not present

## 2018-07-27 DIAGNOSIS — I2510 Atherosclerotic heart disease of native coronary artery without angina pectoris: Secondary | ICD-10-CM | POA: Diagnosis not present

## 2018-07-27 DIAGNOSIS — I472 Ventricular tachycardia: Secondary | ICD-10-CM | POA: Diagnosis not present

## 2018-07-27 DIAGNOSIS — J45909 Unspecified asthma, uncomplicated: Secondary | ICD-10-CM | POA: Diagnosis not present

## 2018-07-27 DIAGNOSIS — C61 Malignant neoplasm of prostate: Secondary | ICD-10-CM | POA: Diagnosis not present

## 2018-07-27 DIAGNOSIS — I5033 Acute on chronic diastolic (congestive) heart failure: Secondary | ICD-10-CM | POA: Diagnosis not present

## 2018-07-28 ENCOUNTER — Ambulatory Visit (INDEPENDENT_AMBULATORY_CARE_PROVIDER_SITE_OTHER): Payer: Medicare Other | Admitting: *Deleted

## 2018-07-28 DIAGNOSIS — I2510 Atherosclerotic heart disease of native coronary artery without angina pectoris: Secondary | ICD-10-CM | POA: Diagnosis not present

## 2018-07-28 DIAGNOSIS — I5033 Acute on chronic diastolic (congestive) heart failure: Secondary | ICD-10-CM | POA: Diagnosis not present

## 2018-07-28 DIAGNOSIS — C61 Malignant neoplasm of prostate: Secondary | ICD-10-CM | POA: Diagnosis not present

## 2018-07-28 DIAGNOSIS — J45909 Unspecified asthma, uncomplicated: Secondary | ICD-10-CM | POA: Diagnosis not present

## 2018-07-28 DIAGNOSIS — I428 Other cardiomyopathies: Secondary | ICD-10-CM | POA: Diagnosis not present

## 2018-07-28 DIAGNOSIS — I472 Ventricular tachycardia: Secondary | ICD-10-CM | POA: Diagnosis not present

## 2018-07-28 NOTE — Telephone Encounter (Signed)
Spoke w/ pt wife and instructed her how to send a remote transmission. Pt wife verbalized understanding.

## 2018-07-29 NOTE — Progress Notes (Signed)
Remote ICD transmission.   

## 2018-07-31 ENCOUNTER — Other Ambulatory Visit: Payer: Self-pay

## 2018-07-31 DIAGNOSIS — I5033 Acute on chronic diastolic (congestive) heart failure: Secondary | ICD-10-CM | POA: Diagnosis not present

## 2018-07-31 DIAGNOSIS — I2510 Atherosclerotic heart disease of native coronary artery without angina pectoris: Secondary | ICD-10-CM | POA: Diagnosis not present

## 2018-07-31 DIAGNOSIS — C61 Malignant neoplasm of prostate: Secondary | ICD-10-CM | POA: Diagnosis not present

## 2018-07-31 DIAGNOSIS — I472 Ventricular tachycardia: Secondary | ICD-10-CM | POA: Diagnosis not present

## 2018-07-31 DIAGNOSIS — J45909 Unspecified asthma, uncomplicated: Secondary | ICD-10-CM | POA: Diagnosis not present

## 2018-07-31 DIAGNOSIS — I428 Other cardiomyopathies: Secondary | ICD-10-CM | POA: Diagnosis not present

## 2018-07-31 NOTE — Patient Outreach (Signed)
[place] [facility]) Care Management  61/22/4497  Dennise Bamber Sweeden [birth year] [id number]   EMMI- Heart Failure RED ON EMMI ALERT Day # 23 Date: 07/30/18 Red Alert Reason: Weight 224 lbs  Outreach attempt: spoke with spouse.  She states that patient is doing well.  Addressed red alert.  She states that tat was entered wrong but today's weight was 221 lbs. Discussed [facility] support for heart failure.  She denies needing right now as she is so busy with other things but wants to continue automated calls.    Plan: RN CM will close case.    Jone Baseman, RN, MSN [facility] Care Management Care Management Coordinator Direct Line [phone] Toll Free: [phone]  Fax: [phone]

## 2018-08-03 ENCOUNTER — Ambulatory Visit (HOSPITAL_COMMUNITY)
Admission: RE | Admit: 2018-08-03 | Discharge: 2018-08-03 | Disposition: A | Discharge status: Home / Self Care | Payer: Medicare Other | Source: Ambulatory Visit | Attending: Cardiovascular Disease | Admitting: Cardiovascular Disease

## 2018-08-03 ENCOUNTER — Ambulatory Visit (HOSPITAL_COMMUNITY): Payer: Medicare Other

## 2018-08-03 ENCOUNTER — Ambulatory Visit (HOSPITAL_BASED_OUTPATIENT_CLINIC_OR_DEPARTMENT_OTHER)
Admission: RE | Admit: 2018-08-03 | Discharge: 2018-08-03 | Disposition: A | Discharge status: Home / Self Care | Payer: Medicare Other | Source: Ambulatory Visit | Attending: Cardiovascular Disease | Admitting: Cardiovascular Disease

## 2018-08-03 ENCOUNTER — Encounter (HOSPITAL_COMMUNITY): Payer: Self-pay

## 2018-08-03 DIAGNOSIS — K5730 Diverticulosis of large intestine without perforation or abscess without bleeding: Secondary | ICD-10-CM | POA: Insufficient documentation

## 2018-08-03 DIAGNOSIS — I2510 Atherosclerotic heart disease of native coronary artery without angina pectoris: Secondary | ICD-10-CM | POA: Insufficient documentation

## 2018-08-03 DIAGNOSIS — K862 Cyst of pancreas: Secondary | ICD-10-CM | POA: Insufficient documentation

## 2018-08-03 DIAGNOSIS — R918 Other nonspecific abnormal finding of lung field: Secondary | ICD-10-CM | POA: Diagnosis not present

## 2018-08-03 DIAGNOSIS — I350 Nonrheumatic aortic (valve) stenosis: Secondary | ICD-10-CM

## 2018-08-03 DIAGNOSIS — K8020 Calculus of gallbladder without cholecystitis without obstruction: Secondary | ICD-10-CM | POA: Diagnosis not present

## 2018-08-03 DIAGNOSIS — I700 Atherosclerosis of aorta: Secondary | ICD-10-CM | POA: Insufficient documentation

## 2018-08-03 DIAGNOSIS — R0609 Other forms of dyspnea: Secondary | ICD-10-CM

## 2018-08-03 DIAGNOSIS — I714 Abdominal aortic aneurysm, without rupture: Secondary | ICD-10-CM | POA: Insufficient documentation

## 2018-08-03 LAB — PULMONARY FUNCTION TEST
DL/VA % PRED: 91 %
DL/VA: 4.22 ml/min/mmHg/L
DLCO UNC % PRED: 64 %
DLCO unc: 21.84 ml/min/mmHg
FEF 25-75 Pre: 1.16 L/sec
FEF2575-%PRED-PRE: 69 %
FEV1-%Pred-Pre: 63 %
FEV1-Pre: 1.7 L
FEV1FVC-%Pred-Pre: 103 %
FEV6-%Pred-Pre: 66 %
FEV6-Pre: 2.36 L
FEV6FVC-%Pred-Pre: 108 %
FVC-%Pred-Pre: 61 %
FVC-PRE: 2.36 L
Pre FEV1/FVC ratio: 72 %
Pre FEV6/FVC Ratio: 100 %
RV % PRED: 97 %
RV: 2.81 L
TLC % pred: 68 %
TLC: 5 L

## 2018-08-03 MED ORDER — IOPAMIDOL (ISOVUE-370) INJECTION 76%
INTRAVENOUS | Status: AC
  Administered 2018-08-03: 100 mL
  Filled 2018-08-03: qty 100

## 2018-08-03 MED ORDER — ALBUTEROL SULFATE (2.5 MG/3ML) 0.083% IN NEBU: 2.5000 mg | INHALATION_SOLUTION | Freq: Once | RESPIRATORY_TRACT | Status: DC

## 2018-08-03 NOTE — Progress Notes (Signed)
*  PRELIMINARY RESULTS* Vascular Ultrasound Carotid Duplex (Doppler) has been completed.  Findings suggest 1-39% internal carotid artery stenosis bilaterally. Vertebral arteries are patent with antegrade flow.  08/03/2018 4:15 PM Maudry Mayhew, MHA, RVT, RDCS, RDMS

## 2018-08-04 ENCOUNTER — Other Ambulatory Visit: Payer: Self-pay

## 2018-08-04 ENCOUNTER — Ambulatory Visit: Payer: Medicare Other | Admitting: Physical Therapy

## 2018-08-04 ENCOUNTER — Institutional Professional Consult (permissible substitution) (INDEPENDENT_AMBULATORY_CARE_PROVIDER_SITE_OTHER): Payer: Medicare Other | Admitting: Surgery

## 2018-08-04 ENCOUNTER — Encounter: Payer: Self-pay | Admitting: Surgery

## 2018-08-04 VITALS — BP 104/54 | HR 54 | Resp 16 | Ht 70.0 in | Wt 220.0 lb

## 2018-08-04 DIAGNOSIS — I5033 Acute on chronic diastolic (congestive) heart failure: Secondary | ICD-10-CM | POA: Diagnosis not present

## 2018-08-04 DIAGNOSIS — C61 Malignant neoplasm of prostate: Secondary | ICD-10-CM | POA: Diagnosis not present

## 2018-08-04 DIAGNOSIS — I5023 Acute on chronic systolic (congestive) heart failure: Secondary | ICD-10-CM | POA: Diagnosis not present

## 2018-08-04 DIAGNOSIS — Z95810 Presence of automatic (implantable) cardiac defibrillator: Secondary | ICD-10-CM

## 2018-08-04 DIAGNOSIS — I2510 Atherosclerotic heart disease of native coronary artery without angina pectoris: Secondary | ICD-10-CM

## 2018-08-04 DIAGNOSIS — J45909 Unspecified asthma, uncomplicated: Secondary | ICD-10-CM | POA: Diagnosis not present

## 2018-08-04 DIAGNOSIS — I472 Ventricular tachycardia: Secondary | ICD-10-CM | POA: Diagnosis not present

## 2018-08-04 DIAGNOSIS — I428 Other cardiomyopathies: Secondary | ICD-10-CM | POA: Diagnosis not present

## 2018-08-04 DIAGNOSIS — I350 Nonrheumatic aortic (valve) stenosis: Secondary | ICD-10-CM | POA: Diagnosis not present

## 2018-08-05 ENCOUNTER — Other Ambulatory Visit: Payer: Self-pay

## 2018-08-05 DIAGNOSIS — I350 Nonrheumatic aortic (valve) stenosis: Secondary | ICD-10-CM

## 2018-08-06 ENCOUNTER — Encounter: Payer: Self-pay | Admitting: Surgery

## 2018-08-06 DIAGNOSIS — I428 Other cardiomyopathies: Secondary | ICD-10-CM | POA: Diagnosis not present

## 2018-08-06 DIAGNOSIS — R3129 Other microscopic hematuria: Secondary | ICD-10-CM | POA: Diagnosis not present

## 2018-08-06 DIAGNOSIS — I472 Ventricular tachycardia: Secondary | ICD-10-CM | POA: Diagnosis not present

## 2018-08-06 DIAGNOSIS — I2510 Atherosclerotic heart disease of native coronary artery without angina pectoris: Secondary | ICD-10-CM | POA: Diagnosis not present

## 2018-08-06 DIAGNOSIS — C61 Malignant neoplasm of prostate: Secondary | ICD-10-CM | POA: Diagnosis not present

## 2018-08-06 DIAGNOSIS — J45909 Unspecified asthma, uncomplicated: Secondary | ICD-10-CM | POA: Diagnosis not present

## 2018-08-06 DIAGNOSIS — I5033 Acute on chronic diastolic (congestive) heart failure: Secondary | ICD-10-CM | POA: Diagnosis not present

## 2018-08-06 DIAGNOSIS — N390 Urinary tract infection, site not specified: Secondary | ICD-10-CM | POA: Diagnosis not present

## 2018-08-06 NOTE — Progress Notes (Signed)
Patient ID: William [name], male   DOB: [birth year], 84 y.o.   MRN: [id number]  [facility]GERY CONSULTATION REPORT  Referring Provider is Croitoru, Dani Gobble, MD PCP is Tisovec, Fransico Him, MD  Chief Complaint  Patient presents with  . Aortic Stenosis    new patient consultation, TAVR consult, review all studies    HPI:  The patient is an 84 year old gentleman with a history of abdominal aortic aneurysm, chronic systolic congestive heart failure status post ICD placement, ventricular tachycardia, history of sleep apnea, hyperlipidemia, and degenerative arthritis/lumbar vertebral fracture with reduced mobility as well as aortic stenosis.  The patient has been followed by Dr. Sallyanne Kuster.  He was recently admitted with progressive worsening shortness of breath and lower extremity edema over several weeks duration.  He was treated with diuresis with significant improvement in his lower extremity edema as well as his breathing.  HIs most recent echocardiogram on 07/03/2018 showed a mean gradient across aortic valve of 21 mmHg.  Peak gradient was 53 mmHg and dimensionless index was 0.16.  Aortic valve area was 0.66 cm.  Left ventricular ejection fraction was 20 to 25%.  He has had progressive dyspnea with low-level exertion.  He is not very active due to a previous fall down the stairs at home about 5 years ago where he broke his back.  He does ambulate some in his house using a walker and if he gets out and has to go any distance he uses a wheelchair.  He still goes out to eat at restaurants several times per week and still works in his business.  He was seen by Dr. Angelena Form for consideration of transcatheter aortic valve replacement.  Cardiac catheterization was performed on 07/06/2018 which showed mild nonobstructive coronary disease.  He is here today with his wife and daughter.  He lives in [place] with his wife.  He reports  progressive exertional dyspnea and fatigue.  This can occur with moving around in the house.  He denies any chest discomfort.  He has had chronic lower extremity edema.  This has been improved since he has been on Lasix.  He denies any dizziness or syncope.  He does have balance problems.  Past Medical History:  Diagnosis Date  . AAA (abdominal aortic aneurysm) ([place])    7/13 3.8cm  . Arthritis    "joints" (01/11/2014)  . Asthma    "seasonal; some foods"   . Automatic implantable cardioverter-defibrillator in situ   . Chronic systolic CHF (congestive heart failure) ([place])   . Dyslipidemia   . Hemorrhage    Post-colonostomy  . HLD (hyperlipidemia)   . Lumbar vertebral fracture ([facility])    L1- 04/11/2014   . Prostate cancer ([place])   . S/P ICD (internal cardiac defibrillator) procedure, 01/11/14 removal of ERI gen and placement of Medtronic Evera XT VR & NEW Right ventricular lead Medtronic 01/12/2014  . Sleep apnea    "lost 60# & don't have it anymore" (01/11/2014)  . Ventricular tachycardia [facility])     Past Surgical History:  Procedure Laterality Date  . CARDIAC CATHETERIZATION  08/20/2004   noncritical CAD,mild global hypokinesis, EF 50%  . CARDIAC DEFIBRILLATOR PLACEMENT  08/23/2004   Medtronic  . CATARACT EXTRACTION W/ INTRAOCULAR LENS IMPLANT Right   . COLECTOMY  1990's  . CRYOABLATION N/A 02/24/2014   Procedure: CRYO ABLATION PROSTATE;  Surgeon: Ailene Rud, MD;  Location: [facility];  Service: Urology;  Laterality: N/A;  .  HERNIA REPAIR     "abdomen; from colon OR"  . ICD GENERATOR CHANGE  01/11/2014   "upgrade"  . IMPLANTABLE CARDIOVERTER DEFIBRILLATOR (ICD) GENERATOR CHANGE N/A 01/11/2014   Procedure: ICD GENERATOR CHANGE;  Surgeon: Sanda Klein, MD;  Location: [facility] C[facility];  Service: Cardiovascular;  Laterality: N/A;  . LEAD REVISION N/A 01/11/2014   Procedure: LEAD REVISION;  Surgeon: Sanda Klein, MD;  Location: [facility];  Service: Cardiovascular;  Laterality: N/A;  . NM  MYOCAR PERF WALL MOTION  01/29/2012   abnormal c/o infarct/scar,no ischemia present  . PROSTATE BIOPSY N/A 11/22/2013   Procedure: PROSTATE BIOPSY AND ULTRASOUND;  Surgeon: Ailene Rud, MD;  Location: [facility];  Service: Urology;  Laterality: N/A;  . RIGHT/LEFT HEART CATH AND CORONARY ANGIOGRAPHY N/A 07/06/2018   Procedure: RIGHT/LEFT HEART CATH AND CORONARY ANGIOGRAPHY;  Surgeon: Troy Sine, MD;  Location: [facility] CV LAB;  Service: Cardiovascular;  Laterality: N/A;    Family History  Problem Relation Age of Onset  . Heart failure Mother        Died of "old age" at 103  . Pneumonia Father     Social History   Socioeconomic History  . Marital status: Married    Spouse name: Not on file  . Number of children: 2  . Years of education: Not on file  . Highest education level: Not on file  Occupational History  . Occupation: Owned a Scientist, [employer]"  Social Needs  . Financial resource strain: Not on file  . Food insecurity:    Worry: Not on file    Inability: Not on file  . Transportation needs:    Medical: Not on file    Non-medical: Not on file  Tobacco Use  . Smoking status: Never Smoker  . Smokeless tobacco: Never Used  Substance and Sexual Activity  . Alcohol use: Yes    Alcohol/week: 0.0 standard drinks    Comment: 01/11/2014 "drink 1/2 of a beer twice per month   . Drug use: No  . Sexual activity: Not Currently  Lifestyle  . Physical activity:    Days per week: Not on file    Minutes per session: Not on file  . Stress: Not on file  Relationships  . Social connections:    Talks on phone: Not on file    Gets together: Not on file    Attends religious service: Not on file    Active member of club or organization: Not on file    Attends meetings of clubs or organizations: Not on file    Relationship status: Not on file  . Intimate partner violence:    Fear of current or ex partner: Not on file    Emotionally abused: Not on file    Physically  abused: Not on file    Forced sexual activity: Not on file  Other Topics Concern  . Not on file  Social History Narrative  . Not on file    Current Outpatient Medications  Medication Sig Dispense Refill  . acetaminophen (TYLENOL) 500 MG tablet Take 500 mg by mouth every 6 (six) hours as needed (for pain).     Marland Kitchen amiodarone (PACERONE) 200 MG tablet TAKE 1/2 TABLET BY MOUTH EVERY DAY (Patient taking differently: Take 100 mg by mouth daily. ) 45 tablet 0  . aspirin 81 MG chewable tablet Chew 1 tablet (81 mg total) by mouth daily. 90 tablet 3  . cholecalciferol (VITAMIN D) 1000 units tablet Take 2,000 Units  by mouth at bedtime.    Marland Kitchen ezetimibe-simvastatin (VYTORIN) 10-20 MG per tablet Take 1 tablet by mouth at bedtime.    . furosemide (LASIX) 20 MG tablet Take 1 tablet (20 mg total) by mouth daily. 90 tablet 3  . metoprolol succinate (TOPROL-XL) 50 MG 24 hr tablet Take 1 tablet (50 mg total) daily by mouth. Take with or immediately following a meal. 90 tablet 3  . Multiple Vitamins-Minerals (PRESERVISION AREDS 2 PO) Take 1 tablet by mouth daily.    . polyethylene glycol (MIRALAX / GLYCOLAX) packet Take 17 g by mouth every evening. 14 each 1  . potassium chloride (K-DUR) 10 MEQ tablet TAKE 1 TABLET BY MOUTH DAILY ALONG WITH LASIX FOR 2 TO 4 DAYS THEN STOP (Patient taking differently: Take 10 mEq by mouth daily. TAKE 1 TABLET BY MOUTH DAILY ALONG WITH LASIX FOR 2 TO 4 DAYS THEN STOP) 30 tablet 6  . Triamcinolone Acetonide (TRIAMCINOLONE 0.1 % CREAM : EUCERIN) CREA Apply 1 application topically as needed for rash or itching.     No current facility-administered medications for this visit.     Allergies  Allergen Reactions  . Crestor [Rosuvastatin] Other (See Comments)    Hurts muscles  . Lipitor [Atorvastatin] Other (See Comments)    Hurts stomach      Review of Systems:   General:  normal appetite, decreased energy, no weight gain, + weight loss with diuresis, no fever  Cardiac:  no  chest pain with exertion, no chest pain at rest, +SOB with  exertion, no resting SOB, no PND, no orthopnea, no palpitations, no arrhythmia, no atrial fibrillation, + LE edema, no dizzy spells, no syncope  Respiratory:  + shortness of breath, no home oxygen, no productive cough, no dry cough, no bronchitis, no wheezing, no hemoptysis, no asthma, no pain with inspiration or cough, + sleep apnea, no CPAP at night  GI:   no difficulty swallowing, no reflux, no frequent heartburn, no hiatal hernia, no abdominal pain, no constipation, no diarrhea, no hematochezia, no hematemesis, no melena  GU:   no dysuria,  no frequency, + urinary tract infection, no hematuria, no enlarged prostate, no kidney stones, no kidney disease  Vascular:  no pain suggestive of claudication, no pain in feet, no leg cramps, + varicose veins, no DVT, no non-healing foot ulcer  Neuro:   no stroke, no TIA's, no seizures, no headaches, no temporary blindness one eye,  no slurred speech, no peripheral neuropathy, no chronic pain, + instability of gait, + memory/cognitive dysfunction  Musculoskeletal: + arthritis, no joint swelling, no myalgias, + difficulty walking, reduced mobility and uses walker  Skin:   no rash, + itching, no skin infections, no pressure sores or ulcerations  Psych:   no anxiety, no depression, no nervousness, no unusual recent stress  Eyes:   no blurry vision, no floaters, no recent vision changes, + wears glasses    ENT:   + hearing loss, bi loose or painful teeth, bi dentures, last saw dentist recently  Hematologic:  + easy bruising, no abnormal bleeding, no clotting disorder, no frequent epistaxis  Endocrine:  no diabetes, does not check CBG's at home        Physical Exam:   BP (!) 104/54 (BP Location: Left Arm, Patient Position: Sitting, Cuff Size: Large)   Pulse (!) 54 Comment: irreg  Resp 16   Ht 5\' 10"  (1.778 m)   Wt 220 lb (99.8 kg)   SpO2 98% Comment: RA  BMI 31.57  kg/m   General:  Elderly,  chronically ill-appearing  HEENT:  Unremarkable, NCAT, PERLA, EOMI, oropharynx clear  Neck:   no JVD, no bruits, no adenopathy or thyromegaly  Chest:   clear to auscultation, symmetrical breath sounds, no wheezes, no rhonchi   CV:   RRR, grade III/VI crescendo/decrescendo murmur heard best at RSB,  no diastolic murmur  Abdomen:  soft, non-tender, no masses   Extremities:  warm, well-perfused, pulses not palpable, 2+ LE edema bilaterally.  Rectal/GU  Deferred  Neuro:   Grossly non-focal and symmetrical throughout  Skin:   Clean and dry, no rashes, no breakdown   Diagnostic Tests:              *[place]*                   *[facility]*                         [address], [place] [address]                            [phone]  ------------------------------------------------------------------- Transthoracic Echocardiography  Patient:    [name], William [name] MR #:       [id number] Study Date: 07/03/2018 Gender:     M Age:        29 Height:     177.8 cm Weight:     105.4 kg BSA:        2.32 m^2 Pt. Status: Room:       3E26C   SONOGRAPHER  Merrie Roof, RDCS  PERFORMING   [facility], Inpatient  ADMITTING    Skeet Latch, MD  [place], MD  Shon Hale  REFERRING    Rob Hickman Tami Lin  cc:  ------------------------------------------------------------------- LV EF: 20% -   25%  ------------------------------------------------------------------- Indications:      CHF - 428.0.  ------------------------------------------------------------------- History:   PMH:  NICM. Nonobstructive CAD. VT.  ------------------------------------------------------------------- Study Conclusions  - Left ventricle: The cavity size was normal. Wall thickness was   increased in a pattern of moderate LVH. Systolic function was   severely reduced. The estimated ejection fraction was in the    range of 20% to 25%. Diffuse hypokinesis. Doppler parameters are   consistent with abnormal left ventricular relaxation (grade 1   diastolic dysfunction). Doppler parameters are consistent with   high ventricular filling pressure. - Aortic valve: Valve mobility was restricted. There was severe   stenosis. Valve area (VTI): 0.76 cm^2. Valve area (Vmax): 0.56   cm^2. Valve area (Vmean): 0.66 cm^2. - Aortic root: The aortic root was mildly dilated. - Mitral valve: Calcified annulus. Mildly thickened leaflets .   There was mild regurgitation. - Left atrium: The atrium was severely dilated. - Right atrium: The atrium was mildly dilated. - Pulmonary arteries: Systolic pressure was mildly increased. PA   peak pressure: 36 mm Hg (S).  Impressions:  - Severe global reduction in LV systolic function; mild diastolic   dysfunction; moderate LVH; mildly dilated aortic root; severe AS;   mild MR; biatrial enlargement; mild TR with mildly elevated   pulmonary pressure.  ------------------------------------------------------------------- Study data:  Comparison was made to the study of 08/20/2004.  Study status:  Routine.  Procedure:  The patient reported no pain pre or post test. Transthoracic echocardiography. Image quality was adequate.          Transthoracic echocardiography.  M-mode, complete 2D, spectral Doppler, and color Doppler.  Birthdate: Patient birthdate: 02-09-1928.  Age:  Patient is 84 yr old.  Sex: Gender: male.    BMI: 33.3 kg/m^2.  Blood pressure:     107/67 Patient status:  Inpatient.  Study date:  Study date: 07/03/2018. Study time: 12:12 PM.  Location:  Bedside.  -------------------------------------------------------------------  ------------------------------------------------------------------- Left ventricle:  The cavity size was normal. Wall thickness was increased in a pattern of moderate LVH. Systolic function was severely reduced. The estimated ejection  fraction was in the range of 20% to 25%. Diffuse hypokinesis. Doppler parameters are consistent with abnormal left ventricular relaxation (grade 1 diastolic dysfunction). Doppler parameters are consistent with high ventricular filling pressure.  ------------------------------------------------------------------- Aortic valve:   Trileaflet; moderately calcified leaflets. Valve mobility was restricted.  Doppler:   There was severe stenosis. There was no regurgitation.    VTI ratio of LVOT to aortic valve: 0.18. Valve area (VTI): 0.76 cm^2. Indexed valve area (VTI): 0.33 cm^2/m^2. Peak velocity ratio of LVOT to aortic valve: 0.14. Valve area (Vmax): 0.56 cm^2. Indexed valve area (Vmax): 0.24 cm^2/m^2. Mean velocity ratio of LVOT to aortic valve: 0.16. Valve area (Vmean): 0.66 cm^2. Indexed valve area (Vmean): 0.29 cm^2/m^2. Mean gradient (S): 21 mm Hg. Peak gradient (S): 53 mm Hg.  ------------------------------------------------------------------- Aorta:  Aortic root: The aortic root was mildly dilated.  ------------------------------------------------------------------- Mitral valve:   Calcified annulus. Mildly thickened leaflets . Mobility was not restricted.  Doppler:  Transvalvular velocity was within the normal range. There was no evidence for stenosis. There was mild regurgitation.    Peak gradient (D): 3 mm Hg.  ------------------------------------------------------------------- Left atrium:  The atrium was severely dilated.  ------------------------------------------------------------------- Right ventricle:  The cavity size was normal. Pacer wire or catheter noted in right ventricle. Systolic function was normal.   ------------------------------------------------------------------- Pulmonic valve:    Doppler:  Transvalvular velocity was within the normal range. There was no evidence for stenosis. There was  mild regurgitation.  ------------------------------------------------------------------- Tricuspid valve:   Structurally normal valve.    Doppler: Transvalvular velocity was within the normal range. There was mild regurgitation.  ------------------------------------------------------------------- Pulmonary artery:   Systolic pressure was mildly increased.  ------------------------------------------------------------------- Right atrium:  The atrium was mildly dilated. Pacer wire or catheter noted in right atrium.  ------------------------------------------------------------------- Pericardium:  There was no pericardial effusion.  ------------------------------------------------------------------- Systemic veins: Inferior vena cava: The vessel was normal in size.  ------------------------------------------------------------------- Measurements   Left ventricle                           Value          Reference  LV ID, ED, PLAX chordal           (H)    52.5  mm       43 - 52  LV ID, ES, PLAX chordal           (H)    40.3  mm       23 - 38  LV fx shortening, PLAX chordal    (L)    23    %        >=29  LV PW thickness, ED  13.76 mm       ----------  IVS/LV PW ratio, ED                      1.06           <=1.3  Stroke volume, 2D                        46    ml       ----------  Stroke volume/bsa, 2D                    20    ml/m^2   ----------  LV e&', lateral                           7.76  cm/s     ----------  LV E/e&', lateral                         10.44          ----------  LV e&', medial                            3.37  cm/s     ----------  LV E/e&', medial                          24.04          ----------  LV e&', average                           5.57  cm/s     ----------  LV E/e&', average                         14.56          ----------  Longitudinal strain, TDI                 4     %        ----------    Ventricular septum                        Value          Reference  IVS thickness, ED                        14.54 mm       ----------    LVOT                                     Value          Reference  LVOT ID, S                               23    mm       ----------  LVOT area                                4.15  cm^2     ----------  LVOT  peak velocity, S                    49.4  cm/s     ----------  LVOT mean velocity, S                    33.8  cm/s     ----------  LVOT VTI, S                              11.1  cm       ----------  LVOT peak gradient, S                    1     mm Hg    ----------    Aortic valve                             Value          Reference  Aortic valve peak velocity, S            363   cm/s     ----------  Aortic valve mean velocity, S            212   cm/s     ----------  Aortic valve VTI, S                      60.4  cm       ----------  Aortic mean gradient, S                  21    mm Hg    ----------  Aortic peak gradient, S                  53    mm Hg    ----------  VTI ratio, LVOT/AV                       0.18           ----------  Aortic valve area, VTI                   0.76  cm^2     ----------  Aortic valve area/bsa, VTI               0.33  cm^2/m^2 ----------  Velocity ratio, peak, LVOT/AV            0.14           ----------  Aortic valve area, peak velocity         0.56  cm^2     ----------  Aortic valve area/bsa, peak              0.24  cm^2/m^2 ----------  velocity  Velocity ratio, mean, LVOT/AV            0.16           ----------  Aortic valve area, mean velocity         0.66  cm^2     ----------  Aortic valve area/bsa, mean              0.29  cm^2/m^2 ----------  velocity    Aorta  Value          Reference  Aortic root ID, ED                       38    mm       ----------    Left atrium                              Value          Reference  LA ID, A-P, ES                           54    mm       ----------  LA ID/bsa, A-P                     (H)    2.33  cm/m^2   <=2.2  LA volume, S                             149   ml       ----------  LA volume/bsa, S                         64.3  ml/m^2   ----------  LA volume, ES, 1-p A4C                   146   ml       ----------  LA volume/bsa, ES, 1-p A4C               63    ml/m^2   ----------  LA volume, ES, 1-p A2C                   142   ml       ----------  LA volume/bsa, ES, 1-p A2C               61.3  ml/m^2   ----------    Mitral valve                             Value          Reference  Mitral E-wave peak velocity              81    cm/s     ----------  Mitral A-wave peak velocity              81.5  cm/s     ----------  Mitral deceleration time          (L)    120   ms       150 - 230  Mitral peak gradient, D                  3     mm Hg    ----------  Mitral E/A ratio, peak                   1              ----------    Pulmonary arteries                       Value  Reference  PA pressure, S, DP                (H)    36    mm Hg    <=30    Tricuspid valve                          Value          Reference  Tricuspid regurg peak velocity           286   cm/s     ----------  Tricuspid peak RV-RA gradient            33    mm Hg    ----------    Right atrium                             Value          Reference  RA ID, S-I, ES, A4C               (H)    74.1  mm       34 - 49  RA area, ES, A4C                  (H)    30.5  cm^2     8.3 - 19.5  RA volume, ES, A/L                       101   ml       ----------  RA volume/bsa, ES, A/L                   43.6  ml/m^2   ----------    Systemic veins                           Value          Reference  Estimated CVP                            3     mm Hg    ----------    Right ventricle                          Value          Reference  RV ID, minor axis, ED, A4C base          37.7  mm       ----------  TAPSE                                    25.3  mm       ----------  RV pressure, S, DP                (H)    36     mm Hg    <=30  RV s&', lateral, S                        10.4  cm/s     ----------  Legend: (L)  and  (H)  mark values outside specified reference range.  ------------------------------------------------------------------- Prepared and Electronically Authenticated by  Kirk Ruths 2019-09-20T14:00:47   Physicians   Panel Physicians Referring Physician Case Authorizing Physician  Troy Sine, MD (Primary)    Procedures   RIGHT/LEFT HEART CATH AND CORONARY ANGIOGRAPHY  Conclusion     Dist RCA lesion is 20% stenosed.  Mid LM lesion is 20% stenosed.  Ost LAD lesion is 20% stenosed.  Prox LAD lesion is 30% stenosed.  Mid LAD lesion is 30% stenosed.  Dist Cx lesion is 50% stenosed.  Ost Cx to Prox Cx lesion is 20% stenosed.  Mid Cx lesion is 30% stenosed.   Significant coronary calcification involving all coronary arteries with 20% smooth distal left main stenosis; 20-30% proximal and mid LAD stenoses; 20 and 30% proximal and mid AV groove circumflex stenosis with 50% distal stenosis; and significant calcification of the RCA with mild luminal irregularity and 20% narrowing.  Upper normal right heart pressures.  Severe low gradient aortic valve stenosis in this patient with echocardiographic documentation of an ejection fraction of 20 to 25%.  The peak to peak gradient is 33 mm with a mean gradient of 26 mm and aortic valve area of 0.7 to 0.9 cm2.  Supravalvular aortography demonstrates calcified aortic valve with severely reduced excursion.  The aortic root is mildly dilated.  RECOMMENDATION: Mr. Ayvin Lipinski has severe low gradient aortic valve stenosis which may be contributing to his severe LV dysfunction.  There is significant coronary calcification without high-grade obstructive stenoses.  Depending upon functional status, consider possible surgical evaluation for consideration of aortic valve replacement. Recommend Aspirin 81mg  daily for moderate  CAD.  Indications   Nonrheumatic aortic valve stenosis [I35.0 (ICD-10-CM)]  Dilated cardiomyopathy ([place]) [I42.0 (ICD-10-CM)]  Procedural Details/Technique   Technical Details Mr. Harvey Matlack is an 84 year old Caucasian male who had recently developed increasing volume overload and has systolic and diastolic function. An echo Doppler study has shown an EF of 20 to 25% and there was evidence for severe low gradient aortic valve stenosis with a mean gradient of 21, peak gradient 53, and estimated valve area of 0.76 cm. The patient is referred for right and left heart cardiac catheterization.  The patient arrived to the catheterization laboratory in the fasting state. He was premedicated with Versed 0.5 mg. Due to his severe AS and previous documentation of dilated aortic root the decision was made to perform right and left heart catheterization via the femoral approach. Ultrasound guidance was used, a picture was taken and placed in the chart. His right femoral artery was punctured anteriorly and a 6 French arterial sheath and 7 French venous sheath were inserted without difficulty. A Swan-Ganz catheter was advanced via the venous sheath and pressures were recorded in the RA, RV, PA, and wedge positions. Oxygen saturation was obtained in the pulmonary artery is also obtained in the central aorta once the catheter was placed. Cardiac outputs were determined by the Fick method as well as the thermodilution methods. A 5 [place] JL 5 diagnostic catheter was ultimately used for selective angiography in the left coronary system. A JR4 catheter was used for selective angiography in the RCA. With a straight wire, ultimately the RCA catheter was able to be advanced into the left ventricle across the aortic stenosis. Initial simultaneous FA and LV pressures were recorded but the FA pressure was dampened. Simultaneous after meals and LV pressures were recorded. A careful LV to AO aortic pullback was performed.  Supravalvular aortography was performed to further evaluate valve mobility and his aortic root. All catheters were removed from  the patient. Hemostasis was obtained by direct manual pressure. She tolerated the procedure well and returned to his room in satisfactory condition.   Estimated blood loss <50 mL.  During this procedure the patient was administered the following to achieve and maintain moderate conscious sedation: Versed 0.5 mg, while the patient's heart rate, blood pressure, and oxygen saturation were continuously monitored. The period of conscious sedation was 49 minutes, of which I was present face-to-face 100% of this time.  Coronary Findings   Diagnostic  Dominance: Right  Left Main  Mid LM lesion 20% stenosed  Mid LM lesion is 20% stenosed.  Left Anterior Descending  Ost LAD lesion 20% stenosed  Ost LAD lesion is 20% stenosed.  Prox LAD lesion 30% stenosed  Prox LAD lesion is 30% stenosed.  Mid LAD lesion 30% stenosed  Mid LAD lesion is 30% stenosed.  Left Circumflex  Ost Cx to Prox Cx lesion 20% stenosed  Ost Cx to Prox Cx lesion is 20% stenosed.  Mid Cx lesion 30% stenosed  Mid Cx lesion is 30% stenosed.  Dist Cx lesion 50% stenosed  Dist Cx lesion is 50% stenosed.  Right Coronary Artery  There is mild diffuse disease throughout the vessel.  Dist RCA lesion 20% stenosed  Dist RCA lesion is 20% stenosed.  Intervention   No interventions have been documented.  Right Heart   Right Heart Pressures RA: a wave 5, v wave 4; mean 3 RV: 28/5 PA: 26/14; mean 19 PW: A-wave 19, V wave 17; mean 14  Initial aortic pressure 98/67.  PW: Mean 21 LV: 128/22  Pullback: LV 129/21 AO: 96/63; mean 78  Oxygen saturation in the PA 65% and in the LV 95%.  Cardiac output by the Fick method 5.1 and by the thermodilution method 4.3 liters per minute. Cardiac index by the Fick method 2.4 and by the thermodilution method 2.0 L/min/m.  Aortic valve: Peak to peak gradient 33;  mean gradient 26. Aortic valve area 0.74 by the thermodilution method and 0.89 by the Fick method.  Coronary Diagrams   Diagnostic Diagram       Implants    No implant documentation for this case.  MERGE Images   Show images for CARDIAC CATHETERIZATION   Link to Procedure Log   Procedure Log    Hemo Data    Most Recent Value  Fick Cardiac Output 5.14 L/min  Fick Cardiac Output Index 2.36 (L/min)/BSA  Thermal Cardiac Output 4.31 L/min  Thermal Cardiac Output Index 1.98 (L/min)/BSA  Aortic Mean Gradient 25.5 mmHg  Aortic Peak Gradient 33 mmHg  Aortic Valve Area 0.74  Aortic Value Area Index 0.34 cm2/BSA  RA A Wave 5 mmHg  RA V Wave 4 mmHg  RA Mean 3 mmHg  RV Systolic Pressure 28 mmHg  RV Diastolic Pressure 0 mmHg  RV EDP 5 mmHg  PA Systolic Pressure 26 mmHg  PA Diastolic Pressure 14 mmHg  PA Mean 19 mmHg  PW A Wave 22 mmHg  PW V Wave 21 mmHg  PW Mean 18 mmHg  AO Systolic Pressure 98 mmHg  AO Diastolic Pressure 67 mmHg  AO Mean 82 mmHg  LV Systolic Pressure 716 mmHg  LV Diastolic Pressure 10 mmHg  LV EDP 21 mmHg  AOp Systolic Pressure 96 mmHg  AOp Diastolic Pressure 63 mmHg  AOp Mean Pressure 78 mmHg  LVp Systolic Pressure 967 mmHg  LVp Diastolic Pressure 6 mmHg  LVp EDP Pressure 21 mmHg  TPVR Index 9.6 HRUI   ADDENDUM  REPORT: 08/03/2018 16:16  CLINICAL DATA:  Aortic stenosis  EXAM: Cardiac TAVR CT  TECHNIQUE: The patient was scanned on a Siemens Force 161 slice scanner. A 120 kV retrospective scan was triggered in the descending thoracic aorta at 111 HU's. Gantry rotation speed was 270 msecs and collimation was .9 mm. No beta blockade or nitro were given. The 3D data set was reconstructed in 5% intervals of the R-R cycle. Systolic and diastolic phases were analyzed on a dedicated work station using MPR, MIP and VRT modes. The patient received 80 cc of contrast.  FINDINGS: Aortic Valve: Trileaflet calcified with restricted leaflet  motion  Aorta: Moderate calcific atherosclerosis. The is a large? Penetrating ulcer or bulky plaque in the descending thoracic aorta just distal to the left subclavian take off that would likely preclude transfemoral approach  Sinotubular Junction: 31 mm  Ascending Thoracic Aorta: 35 mm  Aortic Arch: 33 mm  Descending Thoracic Aorta: 30 mm  Sinus of Valsalva Measurements:  Non-coronary: 35 mm  Right - coronary: 31 mm  Left - coronary: 33 mm  Coronary Artery Height above Annulus:  Left Main: 12.6 mm above annulus  Right Coronary: 17 mm above annulus  Virtual Basal Annulus Measurements:  Maximum/Minimum Diameter: 27.7 mm x 31.1 mm  Perimeter: 93 mm  Area: 699 mm2  Coronary Arteries: Sufficient height for deployment  Optimum Fluoroscopic Angle for Delivery: LAO 2 Caudal 10 degrees  IMPRESSION: 1. Trileaflet AV with annular area 699 mm2 which is upper normal for 29 mm Sapien 3 or suitable for 34 mm Evolute R  2. Optimum angiographic angle for deployment RAO 2 degrees Caudal 10 degrees  3.  Coronary arteries sufficient height above annulus for deployment  4. Lage bulky mixed plaque/ penetrating ulcer in aorta just distal to take off of left subclavian that would likely preclude trans femoral approach with high embolic risk  Jenkins Rouge   Electronically Signed   By: Jenkins Rouge M.D.   On: 08/03/2018 16:16   CLINICAL DATA:  84 year old male with history of severe aortic stenosis. Preprocedural study for potential transcatheter aortic valve replacement (TAVR) procedure.  EXAM: CT ANGIOGRAPHY CHEST, ABDOMEN AND PELVIS  TECHNIQUE: Multidetector CT imaging through the chest, abdomen and pelvis was performed using the standard protocol during bolus administration of intravenous contrast. Multiplanar reconstructed images and MIPs were obtained and reviewed to evaluate the vascular anatomy.  CONTRAST:  163mL ISOVUE-370  IOPAMIDOL (ISOVUE-370) INJECTION 76%  COMPARISON:  CT the abdomen and pelvis 01/23/2015. No prior chest CT.  FINDINGS: CTA CHEST FINDINGS  Cardiovascular: Heart size is enlarged with left atrial dilatation. There is no significant pericardial fluid, thickening or pericardial calcification. There is aortic atherosclerosis, as well as atherosclerosis of the great vessels of the mediastinum and the coronary arteries, including calcified atherosclerotic plaque in the left main, left anterior descending, left circumflex and right coronary arteries. Severe thickening calcification of the aortic valve. Thickening calcification of the mitral valve. Left-sided pacemaker/AICD with leads terminating in the apex of the right ventricle.  Mediastinum/Lymph Nodes: No pathologically enlarged mediastinal or hilar lymph nodes. Esophagus is unremarkable in appearance. No axillary lymphadenopathy.  Lungs/Pleura: Several small pulmonary nodules are noted in the lungs bilaterally, the largest of which measures 6 mm in the periphery of the left lower lobe (axial image 51 of series 15). No larger more suspicious appearing pulmonary nodules or masses are noted. No acute consolidative airspace disease. No pleural effusions. Areas of scarring are noted in the lung bases bilaterally.  Musculoskeletal/Soft Tissues: There are no aggressive appearing lytic or blastic lesions noted in the visualized portions of the skeleton.  CTA ABDOMEN AND PELVIS FINDINGS  Hepatobiliary: Diffuse low attenuation throughout the hepatic parenchyma, indicative of hepatic steatosis. No suspicious cystic or solid hepatic lesions. No intra or extrahepatic biliary ductal dilatation. Gallbladder lumen is filled with innumerable tiny calcified gallstones. No surrounding inflammatory changes to suggest an acute cholecystitis at this time.  Pancreas: No pancreatic mass. Multifocal pancreatic ductal ectasia. Diffuse  atrophy throughout the body and tail of the pancreas. Several small low-attenuation lesions throughout the body of the pancreas, largest of which measures only 1.4 x 0.8 cm (axial image 98 of series 14), minimally increased in retrospect to prior study from 01/23/2015. No peripancreatic fluid collections or inflammatory changes.  Spleen: Unremarkable.  Adrenals/Urinary Tract: Multiple low-attenuation nonenhancing lesions in both kidneys, compatible with simple cysts, measuring up to 4.5 cm in the upper pole of the left kidney. Bilateral adrenal glands are normal in appearance. No hydroureteronephrosis. Urinary bladder is largely decompressed. Urinary bladder wall appears thickened (this could be accentuated by decompression).  Stomach/Bowel: Normal appearance of the stomach. No pathologic dilatation of small bowel or colon. Numerous colonic diverticulae are noted, particularly in the sigmoid colon, without surrounding inflammatory changes to suggest an acute diverticulitis at this time. Postoperative changes of right hemicolectomy.  Vascular/Lymphatic: Aortic atherosclerosis, with fusiform infrarenal abdominal aortic aneurysm measuring up to 4.4 x 4.4 cm in diameter. No dissection noted in the abdominal or pelvic vasculature. Vascular findings and measurements pertinent to potential TAVR procedure, as detailed below. No pathologically enlarged lymph nodes are noted in the abdomen or pelvis.  Reproductive: Prostate gland and seminal vesicles are unremarkable in appearance.  Other: No significant volume of ascites.  No pneumoperitoneum.  Musculoskeletal: Chronic appearing compression fracture of L1 with 90% loss of anterior vertebral body height. There are no aggressive appearing lytic or blastic lesions noted in the visualized portions of the skeleton.  VASCULAR MEASUREMENTS PERTINENT TO TAVR:  AORTA:  Minimal Aortic Diameter-26 x 24 mm  Severity of Aortic  Calcification-severe  RIGHT PELVIS:  Right Common Iliac Artery -  Minimal Diameter-14.6 x 11.3 mm  Tortuosity-moderate  Calcification-moderate to severe  Right External Iliac Artery -  Minimal Diameter-10.7 x 10.0 mm  Tortuosity-severe  Calcification-mild  Right Common Femoral Artery -  Minimal Diameter-10.3 x 10.4 mm  Tortuosity-mild  Calcification-mild-to-moderate  LEFT PELVIS:  Left Common Iliac Artery -  Minimal Diameter-14.1 x 12.4 mm  Tortuosity-moderate  Calcification-moderate to severe  Left External Iliac Artery -  Minimal Diameter-10.7 x 9.4 mm  Tortuosity-moderate to severe  Calcification-mild  Left Common Femoral Artery -  Minimal Diameter-11.0 x 11.0 mm  Tortuosity-mild  Calcification-moderate  Review of the MIP images confirms the above findings.  IMPRESSION: 1. Vascular findings and measurements pertinent to potential TAVR procedure, as detailed above. 2. Severe thickening calcification of the aortic valve, compatible with reported clinical history of severe aortic stenosis. 3. Multiple small pulmonary nodules noted throughout the lungs bilaterally, largest of which measures only 6 mm in the periphery of the left lower lobe (axial image 51 of series 15). Non-contrast chest CT at 6 months is recommended. If the nodules are stable at time of repeat CT, then future CT at 18-24 months (from today's scan) is considered optional for low-risk patients, but is recommended for high-risk patients. This recommendation follows the consensus statement: Guidelines for Management of Incidental Pulmonary Nodules Detected on CT Images: From the Fleischner Society 2017;  Radiology 2017; 284:228-243. 4. Several small low-attenuation lesions are noted in the body of the pancreas, largest of which measures only 1.4 x 0.8 cm, relatively similar to remote prior study from 2016. These are statistically likely benign given the  patient's age and their behavior. If further evaluation is desired, repeat pancreatic protocol CT could be considered in 2 years to confirm continued stability. This recommendation follows ACR consensus guidelines: Management of Incidental Pancreatic Cysts: A White Paper of the ACR Incidental Findings Committee. J Am Coll Radiol 9735;32:992-426. 5. Aortic atherosclerosis, in addition to left main and 3 vessel coronary artery disease. In addition, there is fusiform aneurysmal dilatation of the infrarenal abdominal aorta which measures up to 4.4 cm in diameter. Recommend followup by ultrasound in 1 year. This recommendation follows ACR consensus guidelines: White Paper of the ACR Incidental Findings Committee II on Vascular Findings. J Am Coll Radiol 2013; 10:789-794. 6. Cholelithiasis without evidence of acute cholecystitis at this time. 7. Circumferential thickening of the urinary bladder wall. Although this could simply be related to under distention of the urinary bladder, even accounting for the decompression of the urinary bladder, the wall appears diffusely thickened. Outpatient referral to Urology could be considered for further evaluation if clinically appropriate. 8. Colonic diverticulosis without evidence of acute diverticulitis at this time. 9. Additional incidental findings, as above.   Electronically Signed   By: Vinnie Langton M.D.   On: 08/03/2018 17:00  STS Risk Score: Procedure: Isolated AVR  Risk of Mortality: 3.882%  Renal Failure: 3.488%  Permanent Stroke: 1.593%  Prolonged Ventilation: 13.968%  DSW Infection: 0.180%  Reoperation: 4.329%  Morbidity or Mortality: 19.540%  Short Length of Stay: 20.095%  Long Length of Stay: 11.254%    Impression:   This 84 year old gentleman has stage D, severe, symptomatic aortic stenosis with New York Heart Association class III symptoms of exertional fatigue and shortness of breath as well as severe left  ventricular systolic dysfunction with ejection fraction of 20 to 25% consistent with chronic combined systolic and diastolic congestive heart failure.  I have personally reviewed his echocardiogram, cardiac catheterization, and CTA studies.  His echocardiogram shows a trileaflet aortic valve with moderately calcified leaflets and restricted mobility.  The mean gradient is 21 mmHg with a dimensionless index of 0.14 with a left ventricular ejection fraction of 20 to 25% with diffuse hypokinesis and a low stroke volume index of 20 cc/m consistent with low gradient, low ejection fraction severe aortic stenosis.  His cardiac catheterization shows nonobstructive coronary disease.  The mean gradient across aortic valve was 26 mmHg with a peak to peak gradient of 33 mmHg and an aortic valve area of 0.74 by thermodilution and 0.89 by Fick.  I think the best treatment for him is transcatheter aortic valve replacement.  His gated cardiac CTA shows anatomy suitable for transcatheter aortic valve replacement using a 29 mm Sapien 3 valve.  There is bulky mixed plaque or penetrating ulcer in the distal aortic arch just beyond the takeoff of the left subclavian artery which is located on the lesser curve of the aorta.  I think the risk of embolism is still relatively low since this is on the lesser curve and the wire and valve will be on the greater curve.  His abdominal and pelvic CTA shows pelvic vascular anatomy suitable for transfemoral insertion.  The patient and his wife and daughter were counseled at length regarding treatment alternatives for management of severe symptomatic aortic stenosis. The risks and benefits of surgical  intervention has been discussed in detail. Long-term prognosis with medical therapy was discussed. Alternative approaches such as conventional surgical aortic valve replacement, transcatheter aortic valve replacement, and palliative medical therapy were compared and contrasted at length. This  discussion was placed in the context of the patient's own specific clinical presentation and past medical history. All of their questions have been addressed.   Following the decision to proceed with transcatheter aortic valve replacement, a discussion was held regarding what types of management strategies would be attempted intraoperatively in the event of life-threatening complications, including whether or not the patient would be considered a candidate for the use of cardiopulmonary bypass and/or conversion to open sternotomy for attempted surgical intervention.  I do not think he would be a candidate for median sternotomy under any circumstances.  The patient has been advised of a variety of complications that might develop including but not limited to risks of death, stroke, paravalvular leak, aortic dissection or other major vascular complications, aortic annulus rupture, device embolization, cardiac rupture or perforation, mitral regurgitation, acute myocardial infarction, arrhythmia, heart block or bradycardia requiring permanent pacemaker placement, congestive heart failure, respiratory failure, renal failure, pneumonia, infection, other late complications related to structural valve deterioration or migration, or other complications that might ultimately cause a temporary or permanent loss of functional independence or other long term morbidity. The patient provides full informed consent for the procedure as described and all questions were answered.    Plan:  Transfemoral transcatheter aortic valve replacement on Tuesday, 08/11/2018.   I spent 60 minutes performing this consultation and > 50% of this time was spent face to face counseling and coordinating the care of this patient's severe symptomatic aortic stenosis.    Gaye Pollack, MD 08/04/2018

## 2018-08-06 NOTE — Pre-Procedure Instructions (Signed)
William [name]  [birth year]      [facility] [place], [place] - [address] [place] [address] Phone: [phone] Fax: [phone]  [facility], [place] [address] Phone: [phone] Fax: [phone]    Your procedure is scheduled on Tuesday, Oct. 29th   Report to [facility] Admitting at  11:00 AM             (posted surgery/procedure time 1:24p - 3:24p)   Call this number if you have problems the morning of surgery:  [phone]   Remember:   Do not eat any foods or drink any liquids after midnight, Monday.              Continue taking all current medications without change through the day before surgery.  ON THE MORNING OF, DO NOT TAKE ANY MEDICATIONS.  Stop taking any blood thinners as directed by your surgeon.     Do not wear jewelry - RINGS or watches.  Do not wear lotions, colognes or deodorant.   Men may shave face and neck.   Do not bring valuables to the hospital.  [facility] is not responsible for any belongings or valuables.  Contacts, dentures or bridgework may not be worn into surgery.  Leave your suitcase in the car.  After surgery it may be brought to your room.  For patients admitted to the hospital, discharge time will be determined by your treatment team.  Please read over the following fact sheets that you were given. Pain Booklet, Coughing and Deep Breathing, MRSA Information and Surgical Site Infection Prevention       [facility] [employer]- Preparing For Surgery  Before surgery, you can play an important role. Because skin is not sterile, your skin needs to be as free of germs as possible. You can reduce the number of germs on your skin by washing with CHG (chlorahexidine gluconate) Soap before surgery.  CHG is an antiseptic cleaner which kills  germs and bonds with the skin to continue killing germs even after washing.    Oral Hygiene is also important to reduce your risk of infection.    Remember - BRUSH YOUR TEETH THE MORNING OF SURGERY WITH YOUR REGULAR TOOTHPASTE  Please do not use if you have an allergy to CHG or antibacterial soaps. If your skin becomes reddened/irritated stop using the CHG.  Do not shave (including legs and underarms) for at least 48 hours prior to first CHG shower. It is OK to shave your face.  Please follow these instructions carefully.   1. Shower the NIGHT BEFORE SURGERY and the MORNING OF SURGERY with CHG.   2. If you chose to wash your hair, wash your hair first as usual with your normal shampoo.  3. After you shampoo, rinse your hair and body thoroughly to remove the shampoo.  4. Use CHG as you would any other liquid soap. You can apply CHG directly to the skin and wash gently with a scrungie or a clean washcloth.   5. Apply the CHG Soap to your body ONLY FROM THE NECK DOWN.  Do not use on open wounds or open sores. Avoid contact with your eyes, ears, mouth and genitals (private parts). Wash Face and genitals (private parts)  with your normal soap.  6. Wash thoroughly, paying  special attention to the area where your surgery will be performed.  7. Thoroughly rinse your body with warm water from the neck down.  8. DO NOT shower/wash with your normal soap after using and rinsing off the CHG Soap.  9. Pat yourself dry with a CLEAN TOWEL.  10. Wear CLEAN PAJAMAS to bed the night before surgery, wear comfortable clothes the morning of surgery  11. Place CLEAN SHEETS on your bed the night of your first shower and DO NOT SLEEP WITH PETS.    Day of Surgery:  Do not apply any deodorants/lotions.  Please wear clean clothes to the hospital/surgery center.   Remember to brush your teeth WITH YOUR REGULAR TOOTHPASTE.

## 2018-08-07 ENCOUNTER — Inpatient Hospital Stay (HOSPITAL_COMMUNITY)
Admission: RE | Admit: 2018-08-07 | Discharge: 2018-08-07 | Disposition: A | Discharge status: Home / Self Care | Payer: Medicare Other | Source: Ambulatory Visit

## 2018-08-07 ENCOUNTER — Emergency Department (HOSPITAL_COMMUNITY): Payer: Medicare Other

## 2018-08-07 ENCOUNTER — Encounter (HOSPITAL_COMMUNITY): Payer: Self-pay | Admitting: Emergency Medicine

## 2018-08-07 ENCOUNTER — Inpatient Hospital Stay (HOSPITAL_COMMUNITY)
Admission: EM | Admit: 2018-08-07 | Discharge: 2018-08-13 | DRG: 871 | Disposition: A | Discharge status: Home Health | Payer: Medicare Other | Attending: Internal Medicine | Admitting: Internal Medicine

## 2018-08-07 ENCOUNTER — Other Ambulatory Visit: Payer: Self-pay

## 2018-08-07 DIAGNOSIS — J9600 Acute respiratory failure, unspecified whether with hypoxia or hypercapnia: Secondary | ICD-10-CM

## 2018-08-07 DIAGNOSIS — I2510 Atherosclerotic heart disease of native coronary artery without angina pectoris: Secondary | ICD-10-CM | POA: Diagnosis present

## 2018-08-07 DIAGNOSIS — Z8546 Personal history of malignant neoplasm of prostate: Secondary | ICD-10-CM | POA: Diagnosis not present

## 2018-08-07 DIAGNOSIS — I5042 Chronic combined systolic (congestive) and diastolic (congestive) heart failure: Secondary | ICD-10-CM | POA: Diagnosis present

## 2018-08-07 DIAGNOSIS — G4733 Obstructive sleep apnea (adult) (pediatric): Secondary | ICD-10-CM | POA: Diagnosis present

## 2018-08-07 DIAGNOSIS — M25531 Pain in right wrist: Secondary | ICD-10-CM

## 2018-08-07 DIAGNOSIS — E876 Hypokalemia: Secondary | ICD-10-CM | POA: Diagnosis present

## 2018-08-07 DIAGNOSIS — I714 Abdominal aortic aneurysm, without rupture: Secondary | ICD-10-CM | POA: Diagnosis present

## 2018-08-07 DIAGNOSIS — Z9049 Acquired absence of other specified parts of digestive tract: Secondary | ICD-10-CM | POA: Diagnosis not present

## 2018-08-07 DIAGNOSIS — Z01818 Encounter for other preprocedural examination: Secondary | ICD-10-CM | POA: Diagnosis not present

## 2018-08-07 DIAGNOSIS — J45909 Unspecified asthma, uncomplicated: Secondary | ICD-10-CM | POA: Diagnosis present

## 2018-08-07 DIAGNOSIS — I472 Ventricular tachycardia, unspecified: Secondary | ICD-10-CM

## 2018-08-07 DIAGNOSIS — G309 Alzheimer's disease, unspecified: Secondary | ICD-10-CM | POA: Diagnosis present

## 2018-08-07 DIAGNOSIS — Z95810 Presence of automatic (implantable) cardiac defibrillator: Secondary | ICD-10-CM

## 2018-08-07 DIAGNOSIS — I110 Hypertensive heart disease with heart failure: Secondary | ICD-10-CM | POA: Diagnosis present

## 2018-08-07 DIAGNOSIS — E785 Hyperlipidemia, unspecified: Secondary | ICD-10-CM | POA: Diagnosis present

## 2018-08-07 DIAGNOSIS — A419 Sepsis, unspecified organism: Secondary | ICD-10-CM

## 2018-08-07 DIAGNOSIS — R52 Pain, unspecified: Secondary | ICD-10-CM

## 2018-08-07 DIAGNOSIS — R579 Shock, unspecified: Secondary | ICD-10-CM | POA: Diagnosis present

## 2018-08-07 DIAGNOSIS — B961 Klebsiella pneumoniae [K. pneumoniae] as the cause of diseases classified elsewhere: Secondary | ICD-10-CM | POA: Diagnosis present

## 2018-08-07 DIAGNOSIS — S52691A Other fracture of lower end of right ulna, initial encounter for closed fracture: Secondary | ICD-10-CM | POA: Diagnosis not present

## 2018-08-07 DIAGNOSIS — J9601 Acute respiratory failure with hypoxia: Secondary | ICD-10-CM | POA: Diagnosis not present

## 2018-08-07 DIAGNOSIS — Y95 Nosocomial condition: Secondary | ICD-10-CM | POA: Diagnosis present

## 2018-08-07 DIAGNOSIS — Z79899 Other long term (current) drug therapy: Secondary | ICD-10-CM

## 2018-08-07 DIAGNOSIS — R17 Unspecified jaundice: Secondary | ICD-10-CM | POA: Diagnosis present

## 2018-08-07 DIAGNOSIS — F0280 Dementia in other diseases classified elsewhere without behavioral disturbance: Secondary | ICD-10-CM | POA: Diagnosis present

## 2018-08-07 DIAGNOSIS — S63074A Dislocation of distal end of right ulna, initial encounter: Secondary | ICD-10-CM | POA: Diagnosis not present

## 2018-08-07 DIAGNOSIS — I5022 Chronic systolic (congestive) heart failure: Secondary | ICD-10-CM | POA: Diagnosis present

## 2018-08-07 DIAGNOSIS — A4159 Other Gram-negative sepsis: Principal | ICD-10-CM | POA: Diagnosis present

## 2018-08-07 DIAGNOSIS — F0390 Unspecified dementia without behavioral disturbance: Secondary | ICD-10-CM | POA: Diagnosis not present

## 2018-08-07 DIAGNOSIS — X58XXXA Exposure to other specified factors, initial encounter: Secondary | ICD-10-CM | POA: Diagnosis not present

## 2018-08-07 DIAGNOSIS — I428 Other cardiomyopathies: Secondary | ICD-10-CM | POA: Diagnosis present

## 2018-08-07 DIAGNOSIS — N390 Urinary tract infection, site not specified: Secondary | ICD-10-CM | POA: Diagnosis present

## 2018-08-07 DIAGNOSIS — J90 Pleural effusion, not elsewhere classified: Secondary | ICD-10-CM | POA: Diagnosis not present

## 2018-08-07 DIAGNOSIS — Z7982 Long term (current) use of aspirin: Secondary | ICD-10-CM | POA: Diagnosis not present

## 2018-08-07 DIAGNOSIS — I350 Nonrheumatic aortic (valve) stenosis: Secondary | ICD-10-CM | POA: Diagnosis not present

## 2018-08-07 DIAGNOSIS — J189 Pneumonia, unspecified organism: Secondary | ICD-10-CM | POA: Diagnosis present

## 2018-08-07 DIAGNOSIS — I4510 Unspecified right bundle-branch block: Secondary | ICD-10-CM | POA: Diagnosis not present

## 2018-08-07 DIAGNOSIS — S52591A Other fractures of lower end of right radius, initial encounter for closed fracture: Secondary | ICD-10-CM | POA: Diagnosis not present

## 2018-08-07 DIAGNOSIS — R509 Fever, unspecified: Secondary | ICD-10-CM | POA: Diagnosis not present

## 2018-08-07 DIAGNOSIS — Z01811 Encounter for preprocedural respiratory examination: Secondary | ICD-10-CM

## 2018-08-07 DIAGNOSIS — Z7952 Long term (current) use of systemic steroids: Secondary | ICD-10-CM

## 2018-08-07 DIAGNOSIS — J810 Acute pulmonary edema: Secondary | ICD-10-CM

## 2018-08-07 DIAGNOSIS — R0689 Other abnormalities of breathing: Secondary | ICD-10-CM | POA: Diagnosis not present

## 2018-08-07 DIAGNOSIS — J9690 Respiratory failure, unspecified, unspecified whether with hypoxia or hypercapnia: Secondary | ICD-10-CM | POA: Diagnosis not present

## 2018-08-07 DIAGNOSIS — L8990 Pressure ulcer of unspecified site, unspecified stage: Secondary | ICD-10-CM

## 2018-08-07 DIAGNOSIS — R6521 Severe sepsis with septic shock: Secondary | ICD-10-CM | POA: Diagnosis present

## 2018-08-07 DIAGNOSIS — I959 Hypotension, unspecified: Secondary | ICD-10-CM | POA: Diagnosis not present

## 2018-08-07 HISTORY — DX: Unspecified dementia, unspecified severity, without behavioral disturbance, psychotic disturbance, mood disturbance, and anxiety: F03.90

## 2018-08-07 LAB — URINALYSIS, ROUTINE W REFLEX MICROSCOPIC
Bilirubin Urine: NEGATIVE
Glucose, UA: NEGATIVE mg/dL
Ketones, ur: NEGATIVE mg/dL
NITRITE: NEGATIVE
PH: 6 (ref 5.0–8.0)
Protein, ur: 100 mg/dL — AB
Specific Gravity, Urine: 1.015 (ref 1.005–1.030)
WBC, UA: >50 WBC/hpf — ABNORMAL HIGH (ref 0–5)

## 2018-08-07 LAB — CBC WITH DIFFERENTIAL/PLATELET
ABS IMMATURE GRANULOCYTES: 0.04 10*3/uL (ref 0.00–0.07)
BASOS PCT: 1 %
Basophils Absolute: 0.1 10*3/uL (ref 0.0–0.1)
Eosinophils Absolute: 0.3 10*3/uL (ref 0.0–0.5)
Eosinophils Relative: 3 %
HCT: 38.4 % — ABNORMAL LOW (ref 39.0–52.0)
Hemoglobin: 13.4 g/dL (ref 13.0–17.0)
Immature Granulocytes: 1 %
Lymphocytes Relative: 6 %
Lymphs Abs: 0.5 10*3/uL — ABNORMAL LOW (ref 0.7–4.0)
MCH: 33.4 pg (ref 26.0–34.0)
MCHC: 34.9 g/dL (ref 30.0–36.0)
MCV: 95.8 fL (ref 80.0–100.0)
MONO ABS: 0.6 10*3/uL (ref 0.1–1.0)
Monocytes Relative: 7 %
NEUTROS ABS: 7 10*3/uL (ref 1.7–7.7)
NEUTROS PCT: 82 %
Platelets: 137 10*3/uL — ABNORMAL LOW (ref 150–400)
RBC: 4.01 MIL/uL — ABNORMAL LOW (ref 4.22–5.81)
RDW: 15.4 % (ref 11.5–15.5)
WBC: 8.4 10*3/uL (ref 4.0–10.5)
nRBC: 0 % (ref 0.0–0.2)

## 2018-08-07 LAB — TROPONIN I: TROPONIN I: 0.03 ng/mL — AB (ref <0.03)

## 2018-08-07 LAB — COMPREHENSIVE METABOLIC PANEL
ALK PHOS: 85 U/L (ref 38–126)
ALT: 15 U/L (ref 0–44)
ALT: 13 U/L (ref 0–44)
AST: 21 U/L (ref 15–41)
AST: 20 U/L (ref 15–41)
Albumin: 3.3 g/dL — ABNORMAL LOW (ref 3.5–5.0)
Albumin: 3 g/dL — ABNORMAL LOW (ref 3.5–5.0)
Alkaline Phosphatase: 85 U/L (ref 38–126)
Anion gap: 8 (ref 5–15)
Anion gap: 6 (ref 5–15)
BILIRUBIN TOTAL: 1.8 mg/dL — AB (ref 0.3–1.2)
BUN: 16 mg/dL (ref 8–23)
BUN: 12 mg/dL (ref 8–23)
CALCIUM: 8.2 mg/dL — AB (ref 8.9–10.3)
CHLORIDE: 100 mmol/L (ref 98–111)
CHLORIDE: 107 mmol/L (ref 98–111)
CO2: 26 mmol/L (ref 22–32)
CO2: 25 mmol/L (ref 22–32)
CREATININE: 1.3 mg/dL — AB (ref 0.61–1.24)
CREATININE: 1.16 mg/dL (ref 0.61–1.24)
Calcium: 8.8 mg/dL — ABNORMAL LOW (ref 8.9–10.3)
GFR, EST AFRICAN AMERICAN: 55 mL/min — AB (ref >60)
GFR, EST NON AFRICAN AMERICAN: 47 mL/min — AB (ref >60)
GFR, EST NON AFRICAN AMERICAN: 54 mL/min — AB (ref >60)
Glucose, Bld: 164 mg/dL — ABNORMAL HIGH (ref 70–99)
Glucose, Bld: 102 mg/dL — ABNORMAL HIGH (ref 70–99)
POTASSIUM: 3.6 mmol/L (ref 3.5–5.1)
Potassium: 3.2 mmol/L — ABNORMAL LOW (ref 3.5–5.1)
Sodium: 134 mmol/L — ABNORMAL LOW (ref 135–145)
Sodium: 138 mmol/L (ref 135–145)
Total Bilirubin: 1.7 mg/dL — ABNORMAL HIGH (ref 0.3–1.2)
Total Protein: 6.3 g/dL — ABNORMAL LOW (ref 6.5–8.1)
Total Protein: 6 g/dL — ABNORMAL LOW (ref 6.5–8.1)

## 2018-08-07 LAB — I-STAT CG4 LACTIC ACID, ED
LACTIC ACID, VENOUS: 1.54 mmol/L (ref 0.5–1.9)
Lactic Acid, Venous: 1.94 mmol/L — ABNORMAL HIGH (ref 0.5–1.9)

## 2018-08-07 LAB — CBC
HCT: 36 % — ABNORMAL LOW (ref 39.0–52.0)
Hemoglobin: 12.5 g/dL — ABNORMAL LOW (ref 13.0–17.0)
MCH: 33.2 pg (ref 26.0–34.0)
MCHC: 34.7 g/dL (ref 30.0–36.0)
MCV: 95.7 fL (ref 80.0–100.0)
NRBC: 0 % (ref 0.0–0.2)
Platelets: 147 10*3/uL — ABNORMAL LOW (ref 150–400)
RBC: 3.76 MIL/uL — ABNORMAL LOW (ref 4.22–5.81)
RDW: 15.6 % — AB (ref 11.5–15.5)
WBC: 8.3 10*3/uL (ref 4.0–10.5)

## 2018-08-07 LAB — PROCALCITONIN: Procalcitonin: <0.1 ng/mL

## 2018-08-07 LAB — MRSA PCR SCREENING: MRSA BY PCR: NEGATIVE

## 2018-08-07 LAB — PROTIME-INR
INR: 1.13
INR: 1.25
PROTHROMBIN TIME: 14.4 s (ref 11.4–15.2)
Prothrombin Time: 15.5 seconds — ABNORMAL HIGH (ref 11.4–15.2)

## 2018-08-07 LAB — APTT: APTT: 34 s (ref 24–36)

## 2018-08-07 LAB — MAGNESIUM
MAGNESIUM: 2.2 mg/dL (ref 1.7–2.4)
Magnesium: 2.2 mg/dL (ref 1.7–2.4)

## 2018-08-07 LAB — PHOSPHORUS
PHOSPHORUS: 3.3 mg/dL (ref 2.5–4.6)
Phosphorus: 3.6 mg/dL (ref 2.5–4.6)

## 2018-08-07 LAB — CORTISOL: Cortisol, Plasma: 13 ug/dL

## 2018-08-07 LAB — AMYLASE: AMYLASE: 19 U/L — AB (ref 28–100)

## 2018-08-07 LAB — LACTIC ACID, PLASMA
LACTIC ACID, VENOUS: 1.7 mmol/L (ref 0.5–1.9)
Lactic Acid, Venous: 1.4 mmol/L (ref 0.5–1.9)

## 2018-08-07 LAB — LIPASE, BLOOD: Lipase: 26 U/L (ref 11–51)

## 2018-08-07 MED ORDER — ACETAMINOPHEN 650 MG RE SUPP: 650.0000 mg | Freq: Four times a day (QID) | RECTAL | Status: DC | PRN

## 2018-08-07 MED ORDER — PANTOPRAZOLE SODIUM 40 MG PO TBEC
40.0000 mg | DELAYED_RELEASE_TABLET | Freq: Every day | ORAL | Status: DC
  Administered 2018-08-07 – 2018-08-13 (×7): 40 mg via ORAL
  Filled 2018-08-07 (×7): qty 1

## 2018-08-07 MED ORDER — ACETAMINOPHEN 325 MG PO TABS
650.0000 mg | ORAL_TABLET | Freq: Once | ORAL | Status: AC
  Administered 2018-08-07: 650 mg via ORAL
  Filled 2018-08-07: qty 2

## 2018-08-07 MED ORDER — SODIUM CHLORIDE 0.9 % IV SOLN
INTRAVENOUS | Status: DC | PRN
  Administered 2018-08-07: 1000 mL via INTRAVENOUS
  Administered 2018-08-08: 500 mL via INTRAVENOUS

## 2018-08-07 MED ORDER — METOPROLOL SUCCINATE ER 50 MG PO TB24
50.0000 mg | ORAL_TABLET | Freq: Every day | ORAL | Status: DC
  Administered 2018-08-09: 50 mg via ORAL
  Filled 2018-08-07 (×2): qty 1

## 2018-08-07 MED ORDER — SODIUM CHLORIDE 0.9 % IV SOLN
500.0000 mg | Freq: Once | INTRAVENOUS | Status: AC
  Administered 2018-08-07: 500 mg via INTRAVENOUS
  Filled 2018-08-07: qty 500

## 2018-08-07 MED ORDER — SODIUM CHLORIDE 0.9 % IV SOLN: 2.0000 g | Freq: Once | INTRAVENOUS | Status: DC

## 2018-08-07 MED ORDER — NOREPINEPHRINE 4 MG/250ML-% IV SOLN
INTRAVENOUS | Status: AC
  Filled 2018-08-07: qty 250

## 2018-08-07 MED ORDER — SODIUM CHLORIDE 0.9 % IV BOLUS
500.0000 mL | Freq: Once | INTRAVENOUS | Status: AC
  Administered 2018-08-07: 500 mL via INTRAVENOUS

## 2018-08-07 MED ORDER — SODIUM CHLORIDE 0.9 % IV BOLUS: 500.0000 mL | Freq: Once | INTRAVENOUS | Status: DC

## 2018-08-07 MED ORDER — ONDANSETRON HCL 4 MG PO TABS: 4.0000 mg | ORAL_TABLET | Freq: Four times a day (QID) | ORAL | Status: DC | PRN

## 2018-08-07 MED ORDER — HEPARIN SODIUM (PORCINE) 5000 UNIT/ML IJ SOLN
5000.0000 [IU] | Freq: Three times a day (TID) | INTRAMUSCULAR | Status: DC
  Administered 2018-08-07 – 2018-08-13 (×17): 5000 [IU] via SUBCUTANEOUS
  Filled 2018-08-07 (×18): qty 1

## 2018-08-07 MED ORDER — POLYETHYLENE GLYCOL 3350 17 G PO PACK
17.0000 g | PACK | Freq: Every evening | ORAL | Status: DC
  Administered 2018-08-11 – 2018-08-12 (×2): 17 g via ORAL
  Filled 2018-08-07 (×3): qty 1

## 2018-08-07 MED ORDER — SODIUM CHLORIDE 0.9 % IV SOLN
500.0000 mg | INTRAVENOUS | Status: DC
  Administered 2018-08-08: 500 mg via INTRAVENOUS
  Filled 2018-08-07: qty 500

## 2018-08-07 MED ORDER — ASPIRIN 81 MG PO CHEW
81.0000 mg | CHEWABLE_TABLET | Freq: Every day | ORAL | Status: DC
  Administered 2018-08-07 – 2018-08-13 (×7): 81 mg via ORAL
  Filled 2018-08-07 (×7): qty 1

## 2018-08-07 MED ORDER — SODIUM CHLORIDE 0.9 % IV SOLN
1.0000 g | INTRAVENOUS | Status: DC
  Administered 2018-08-07: 1 g via INTRAVENOUS
  Filled 2018-08-07 (×2): qty 1

## 2018-08-07 MED ORDER — ACETAMINOPHEN 325 MG PO TABS
650.0000 mg | ORAL_TABLET | Freq: Four times a day (QID) | ORAL | Status: DC | PRN
  Administered 2018-08-09: 650 mg via ORAL
  Filled 2018-08-07: qty 2

## 2018-08-07 MED ORDER — SODIUM CHLORIDE 0.9 % IV SOLN: 2.0000 g | INTRAVENOUS | Status: DC

## 2018-08-07 MED ORDER — VITAMIN D 1000 UNITS PO TABS
2000.0000 [IU] | ORAL_TABLET | Freq: Every day | ORAL | Status: DC
  Administered 2018-08-07 – 2018-08-12 (×6): 2000 [IU] via ORAL
  Filled 2018-08-07 (×6): qty 2

## 2018-08-07 MED ORDER — SODIUM CHLORIDE 0.9 % IV SOLN
1.0000 g | INTRAVENOUS | Status: DC
  Administered 2018-08-07: 1 g via INTRAVENOUS
  Filled 2018-08-07: qty 10

## 2018-08-07 MED ORDER — PHENYLEPHRINE HCL-NACL 10-0.9 MG/250ML-% IV SOLN
0.0000 ug/min | INTRAVENOUS | Status: DC
  Administered 2018-08-07: 20 ug/min via INTRAVENOUS
  Administered 2018-08-07 (×2): 60 ug/min via INTRAVENOUS
  Administered 2018-08-07: 45 ug/min via INTRAVENOUS
  Administered 2018-08-07: 60 ug/min via INTRAVENOUS
  Administered 2018-08-08: 20 ug/min via INTRAVENOUS
  Filled 2018-08-07 (×6): qty 250

## 2018-08-07 MED ORDER — SODIUM CHLORIDE 0.9 % IV SOLN: INTRAVENOUS | Status: DC

## 2018-08-07 MED ORDER — EZETIMIBE-SIMVASTATIN 10-20 MG PO TABS
1.0000 | ORAL_TABLET | Freq: Every day | ORAL | Status: DC
  Administered 2018-08-07 – 2018-08-12 (×6): 1 via ORAL
  Filled 2018-08-07 (×8): qty 1

## 2018-08-07 MED ORDER — POTASSIUM CHLORIDE CRYS ER 10 MEQ PO TBCR
10.0000 meq | EXTENDED_RELEASE_TABLET | Freq: Every day | ORAL | Status: DC
  Administered 2018-08-07 – 2018-08-12 (×6): 10 meq via ORAL
  Filled 2018-08-07 (×8): qty 1

## 2018-08-07 MED ORDER — AMIODARONE HCL 200 MG PO TABS
100.0000 mg | ORAL_TABLET | Freq: Every day | ORAL | Status: DC
  Administered 2018-08-07 – 2018-08-13 (×7): 100 mg via ORAL
  Filled 2018-08-07 (×7): qty 1

## 2018-08-07 MED ORDER — ONDANSETRON HCL 4 MG/2ML IJ SOLN: 4.0000 mg | Freq: Four times a day (QID) | INTRAMUSCULAR | Status: DC | PRN

## 2018-08-07 NOTE — H&P (Addendum)
PULMONARY / CRITICAL CARE MEDICINE   NAME:  William [name], MRN:  [id number], DOB:  1927-12-10, LOS: 0 ADMISSION DATE:  08/07/2018, CONSULTATION DATE: 08/07/2018 REFERRING MD: Emergency department physician, CHIEF COMPLAINT: Shock hypertension  BRIEF HISTORY:    84 year old with a plethora of health issues presents with sepsis from presumed urinary tract infection HISTORY OF PRESENT ILLNESS   William [name] is an 84 year old male with a plethora of health issues that include aortic stenosis plan for valvuloplasty in the near future, congestive heart failure with a EF noted to be for 20%, ventricular tachycardia in the past with an AICD implantation, history of prostate cancer with frequent urinary tract infections secondary incontinence. He was noted to be in his usual state of decent health 48 hours prior to admission his wife stated he had cloudy urine and had recently been treated for urinary tract infection with sulfa drugs.  She took a sample to his primary care physician's office was given a prescription for sulfa drugs but he only got 1 dose was found to be confused hypotensive and transferred to [facility] emergency department. He was treated with antimicrobial therapy, gentle hydration, along with Neo-Synephrine and improved with current treatments. Be admitted to the intensive care unit for further evaluation and treatment. SIGNIFICANT PAST MEDICAL HISTORY   Congestive heart failure Aortic valve stenosis Recent urinary tract infection Ventricular tachycardia with AICD in place  SIGNIFICANT EVENTS:   STUDIES:    CULTURES:  08/07/2018 urine culture>> 08/07/2018 blood culture x2>>  ANTIBIOTICS:  08/07/2018 Rocephin>> 08/07/2018 Zithromax>>  LINES/TUBES:    CONSULTANTS:   SUBJECTIVE:  84 year old male admitted with hypotension most likely secondary to urinary tract infections sepsi8  CONSTITUTIONAL: BP 98/74   Pulse 85   Temp (S) (!) 100.8 F (38.2 C) (Rectal)    Resp (!) 22   Ht 5\' 10"  (1.778 m)   Wt 100 kg   SpO2 98%   BMI 31.63 kg/m   I/O last 3 completed shifts: In: 1100 [I.V.:500; IV Piggyback:600] Out: 150 [Urine:150]        PHYSICAL EXAM: General: Elderly male extremely hard of hearing but follows commands Neuro: Commands, moves all extremities, hard of hearing. HEENT: No JVD lymphadenopathy is appreciated Cardiovascular: Sounds are regular with murmur Lungs: Decreased breath sounds in the bases Abdomen: Soft nontender Musculoskeletal: Back Skin: 1-2+ lower extremity edema  RESOLVED PROBLEM LIST   ASSESSMENT AND PLAN   Shock/hypotension in the setting of presumed urinary tract infection. Antimicrobial therapy Admit to the ICU Gentle hydration with history of CHF Panculture  History of aortic valve disease plan for valvular plasty in the near future.  Note he is being followed by Dr. Rayburn Ma cardiovascular thoracic surgery History of AAA History of congestive heart failure cardiomyopathy We will hold any surgical interventions in the near future. Cardiology consult  History of ventricular tachycardia with AICD placed Cardiology consult  History of dementia Frequent orientation Careful any sedating medication Family at bedside  Suspicion of obstructive sleep apnea Monitor O2 sat May need CPAP in the near future  SUMMARY OF TODAY'S PLAN:  Admit with hypotension presumed sepsis from urinary tract infection. Plethora of past medical history issues. Congestive heart failure therefore gentle hydration. Antimicrobial therapy for UTI. ICU admit   Best Practice / Goals of Care / Disposition.   DVT PROPHYLAXIS: Subcu SUP: PPI NUTRITION: Heart healthy diet MOBILITY: Up with assistance GOALS OF CARE: Full code FAMILY DISCUSSIONS: Patient and wife updated bedside 08/07/2018. DISPOSITION admit to intensive care unit  LABS  Glucose No results for input(s): GLUCAP in the last 168 hours.  BMET Recent Labs  Lab  08/07/18 0512  NA 134*  K 3.6  CL 100  CO2 26  BUN 16  CREATININE 1.30*  GLUCOSE 164*    Liver Enzymes Recent Labs  Lab 08/07/18 0512  AST 21  ALT 15  ALKPHOS 85  BILITOT 1.7*  ALBUMIN 3.3*    Electrolytes Recent Labs  Lab 08/07/18 0512  CALCIUM 8.8*  MG 2.2  PHOS 3.6    CBC Recent Labs  Lab 08/07/18 0512  WBC 8.4  HGB 13.4  HCT 38.4*  PLT 137*    ABG No results for input(s): PHART, PCO2ART, PO2ART in the last 168 hours.  Coag's Recent Labs  Lab 08/07/18 0512  INR 1.13    Sepsis Markers Recent Labs  Lab 08/07/18 0522 08/07/18 0925 08/07/18 0933  LATICACIDVEN 1.94* 1.7 1.54    Cardiac Enzymes No results for input(s): TROPONINI, PROBNP in the last 168 hours.  PAST MEDICAL HISTORY :   He  has a past medical history of AAA (abdominal aortic aneurysm) ([place]), Arthritis, Asthma, Automatic implantable cardioverter-defibrillator in situ, Chronic systolic CHF (congestive heart failure) ([place]), Dyslipidemia, Hemorrhage, HLD (hyperlipidemia), Lumbar vertebral fracture ([place]), Prostate cancer ([place]), S/P ICD (internal cardiac defibrillator) procedure, 01/11/14 removal of ERI gen and placement of Medtronic Evera XT VR & NEW Right ventricular lead Medtronic (01/12/2014), Sleep apnea, and Ventricular tachycardia ([place]).  PAST SURGICAL HISTORY:  He  has a past surgical history that includes NM MYOCAR [place] (01/29/2012); Cardiac catheterization (08/20/2004); Prostate biopsy (N/A, 11/22/2013); Icd generator change (01/11/2014); Colectomy (1990's); Hernia repair; Cataract extraction w/ intraocular lens implant (Right); Cardiac defibrillator placement (08/23/2004); Cryoablation (N/A, 02/24/2014); implantable cardioverter defibrillator (icd) generator change (N/A, 01/11/2014); Lead revision (N/A, 01/11/2014); and RIGHT/LEFT HEART CATH AND CORONARY ANGIOGRAPHY (N/A, 07/06/2018).  Allergies  Allergen Reactions  . Crestor [Rosuvastatin] Other (See Comments)    Hurts muscles   . Lipitor [Atorvastatin] Other (See Comments)    Hurts stomach    No current facility-administered medications on file prior to encounter.    Current Outpatient Medications on File Prior to Encounter  Medication Sig  . acetaminophen (TYLENOL) 500 MG tablet Take 500 mg by mouth every 6 (six) hours as needed (for pain).   Marland Kitchen amiodarone (PACERONE) 200 MG tablet TAKE 1/2 TABLET BY MOUTH EVERY DAY (Patient taking differently: Take 100 mg by mouth daily. )  . aspirin 81 MG chewable tablet Chew 1 tablet (81 mg total) by mouth daily.  . cholecalciferol (VITAMIN D) 1000 units tablet Take 2,000 Units by mouth at bedtime.  Marland Kitchen ezetimibe-simvastatin (VYTORIN) 10-20 MG per tablet Take 1 tablet by mouth at bedtime.  . furosemide (LASIX) 20 MG tablet Take 1 tablet (20 mg total) by mouth daily.  . metoprolol succinate (TOPROL-XL) 50 MG 24 hr tablet Take 1 tablet (50 mg total) daily by mouth. Take with or immediately following a meal.  . Multiple Vitamins-Minerals (PRESERVISION AREDS 2 PO) Take 1 tablet by mouth daily.  . polyethylene glycol (MIRALAX / GLYCOLAX) packet Take 17 g by mouth every evening.  . potassium chloride (K-DUR) 10 MEQ tablet TAKE 1 TABLET BY MOUTH DAILY ALONG WITH LASIX FOR 2 TO 4 DAYS THEN STOP (Patient taking differently: Take 10 mEq by mouth daily. TAKE 1 TABLET BY MOUTH DAILY ALONG WITH LASIX FOR 2 TO 4 DAYS THEN STOP)  . sulfamethoxazole-trimethoprim (BACTRIM DS,SEPTRA DS) 800-160 MG tablet Take 1 tablet by mouth  2 (two) times daily.  . Triamcinolone Acetonide (TRIAMCINOLONE 0.1 % CREAM : EUCERIN) CREA Apply 1 application topically as needed for rash or itching.    FAMILY HISTORY:   His family history includes Heart failure in his mother; Pneumonia in his father.  SOCIAL HISTORY:  He  reports that he has never smoked. He has never used smokeless tobacco. He reports that he drinks alcohol. He reports that he does not use drugs.  REVIEW OF SYSTEMS:    10 point review of system  taken, please see HPI for positives and negatives.  App CCT 45 min   Richardson Landry Minor ACNP Maryanna Shape PCCM Pager [phone] till 1 pm If no answer page [phone] 08/07/2018, 12:10 PM  Attending Note:  84 year old male with an abundance of medical problems who presents to PCCM with septic shock due to a UTI.  Patient is mentating with clear lungs.  I reviewed CXR myself, mild pulmonary edema noted, discussed with PCCM-NP.  Will d/c further IVF given cardiac condition.  Will begin neo for BP support peripherally for now.  Only requiring 45 mcg.  Pan cultures.  F/u on cultures.  D/C rocephin and start cefepime.  Continue zithromax for ?bronchitis.  PCCM will admit to the ICU and continue to follow.  If mental status remains stable will consider starting a diet.  Maintain on bedrest for now.  Full code status confirmed.  The patient is critically ill with multiple organ systems failure and requires high complexity decision making for assessment and support, frequent evaluation and titration of therapies, application of advanced monitoring technologies and extensive interpretation of multiple databases.   Critical Care Time devoted to patient care services described in this note is  45  Minutes. This time reflects time of care of this signee Dr Jennet Maduro. This critical care time does not reflect procedure time, or teaching time or supervisory time of PA/NP/Med student/Med Resident etc but could involve care discussion time.  Rush Farmer, M.D. [facility] Pulmonary/Critical Care Medicine. Pager: [phone]. After hours pager: [phone].

## 2018-08-07 NOTE — ED Notes (Addendum)
Pt has AICD- wife states that he has never been shocked. Medtronic pacer

## 2018-08-07 NOTE — ED Notes (Signed)
Patient transported to X-ray 

## 2018-08-07 NOTE — ED Notes (Signed)
Dr. Olevia Bowens notified of pt's BP after 1500cc in--

## 2018-08-07 NOTE — ED Notes (Signed)
Pt's manual BP - 80/52 Dr. Olevia Bowens aware

## 2018-08-07 NOTE — Consult Note (Signed)
Consult note   William [name] QMG:[id number] DOB: [birth year] DOA: 08/07/2018  PCP: Haywood Pao, MD   Patient coming from: Home.  I have personally briefly reviewed patient's old medical records in [place]  Chief Complaint: Weakness and confusion.  HPI: William [name] is a 84 y.o. male with medical history significant of AAA, osteoarthritis, asthma, history of sustained V. tach, history of AICD, chronic systolic CHF, hyperlipidemia, history of lumbar spine fracture, prostate cancer, sleep apnea, aortic stenosis is scheduled for valve replacement next week who is brought to the emergency department by his wife due to weakness and confusion.  Per patient's wife his urine has turned cloudy and has another to it.  They saw the patient's PCP and was started on Bactrim, but only took 1 dose at home.  His wife found him this morning to be more confused and weak so decided to call EMS.  ED Course: Initial temperature was measured at 98.7, at 504 this morning, then a few minutes later rechecked at 100.8 F.  His heart rate was 101, respirations 22, blood pressure 98/73 mmHg and O2 sat 96% on room air.  He did receive cefepime and vancomycin.  He have 1500 mL of NS bolus, however the patient's blood pressure started falling despite these measures.  I started him on Neo-Synephrine and consult the ICU for admission to their service.  His urinalysis showed moderate hemoglobinuria, 100 mg/dL proteinuria, large leukocyte esterase.  Microscopic showed few bacteria, 11-20 RBC and more than 50 WBC per hpf.  White count was 8.4 with 82% neutrophils, 7% monocytes and 6% lymphocytes.  Hemoglobin 13.3 g/dL and platelets 137.  PT and INR were normal.  His electrolytes were normal when calcium is corrected to albumin.  BUN 16, creatinine 1.30, glucose 164, calcium 8.8 and total bilirubin 1.7 mg/dL.  Total protein 6.3 and albumin 3.3 g/dL.  Liver enzymes are normal.  His initial lactic acid was 1.94,  but subsequently normalized.  Review of Systems: Unable to obtain acuity and AMS.   Past Medical History:  Diagnosis Date  . AAA (abdominal aortic aneurysm) ([place])    7/13 3.8cm  . Arthritis    "joints" (01/11/2014)  . Asthma    "seasonal; some foods"   . Automatic implantable cardioverter-defibrillator in situ   . Chronic systolic CHF (congestive heart failure) ([place])   . Dyslipidemia   . Hemorrhage    Post-colonostomy  . HLD (hyperlipidemia)   . Lumbar vertebral fracture ([facility])    L1- 04/11/2014   . Prostate cancer ([place])   . S/P ICD (internal cardiac defibrillator) procedure, 01/11/14 removal of ERI gen and placement of Medtronic Evera XT VR & NEW Right ventricular lead Medtronic 01/12/2014  . Sleep apnea    "lost 60# & don't have it anymore" (01/11/2014)  . Ventricular tachycardia [facility])     Past Surgical History:  Procedure Laterality Date  . CARDIAC CATHETERIZATION  08/20/2004   noncritical CAD,mild global hypokinesis, EF 50%  . CARDIAC DEFIBRILLATOR PLACEMENT  08/23/2004   Medtronic  . CATARACT EXTRACTION W/ INTRAOCULAR LENS IMPLANT Right   . COLECTOMY  1990's  . CRYOABLATION N/A 02/24/2014   Procedure: CRYO ABLATION PROSTATE;  Surgeon: Ailene Rud, MD;  Location: [facility] ORS;  Service: Urology;  Laterality: N/A;  . HERNIA REPAIR     "abdomen; from colon OR"  . ICD GENERATOR CHANGE  01/11/2014   "upgrade"  . IMPLANTABLE CARDIOVERTER DEFIBRILLATOR (ICD) GENERATOR CHANGE N/A 01/11/2014   Procedure: ICD GENERATOR  CHANGE;  Surgeon: Sanda Klein, MD;  Location[facility] C[facility] LAB;  Service: Cardiovascular;  Laterality: N/A;  . LEAD REVISION N/A 01/11/2014   Procedure: LEAD REVISION;  Surgeon: Sanda Klein, MD;  Location: [facility] CATH LAB;  Service: Cardiovascular;  Laterality: N/A;  . NM MYOCAR PERF WALL MOTION  01/29/2012   abnormal c/o infarct/scar,no ischemia present  . PROSTATE BIOPSY N/A 11/22/2013   Procedure: PROSTATE BIOPSY AND ULTRASOUND;  Surgeon: Ailene Rud, MD;   Location: [facility] ORS;  Service: Urology;  Laterality: N/A;  . RIGHT/LEFT HEART CATH AND CORONARY ANGIOGRAPHY N/A 07/06/2018   Procedure: RIGHT/LEFT HEART CATH AND CORONARY ANGIOGRAPHY;  Surgeon: Troy Sine, MD;  Location: [place] CV LAB;  Service: Cardiovascular;  Laterality: N/A;     reports that he has never smoked. He has never used smokeless tobacco. He reports that he drinks alcohol. He reports that he does not use drugs.  Allergies  Allergen Reactions  . Crestor [Rosuvastatin] Other (See Comments)    Hurts muscles  . Lipitor [Atorvastatin] Other (See Comments)    Hurts stomach    Family History  Problem Relation Age of Onset  . Heart failure Mother        Died of "old age" at 28  . Pneumonia Father    Prior to Admission medications   Medication Sig Start Date End Date Taking? Authorizing Provider  acetaminophen (TYLENOL) 500 MG tablet Take 500 mg by mouth every 6 (six) hours as needed (for pain).    Yes [provider]  amiodarone (PACERONE) 200 MG tablet TAKE 1/2 TABLET BY MOUTH EVERY DAY Patient taking differently: Take 100 mg by mouth daily.  05/12/18  Yes Croitoru, Mihai, MD  aspirin 81 MG chewable tablet Chew 1 tablet (81 mg total) by mouth daily. 07/08/18  Yes Kroeger, Lorelee Cover., PA-C  cholecalciferol (VITAMIN D) 1000 units tablet Take 2,000 Units by mouth at bedtime.   Yes [provider]  ezetimibe-simvastatin (VYTORIN) 10-20 MG per tablet Take 1 tablet by mouth at bedtime.   Yes [provider]  furosemide (LASIX) 20 MG tablet Take 1 tablet (20 mg total) by mouth daily. 07/08/18 10/06/18 Yes Croitoru, Mihai, MD  metoprolol succinate (TOPROL-XL) 50 MG 24 hr tablet Take 1 tablet (50 mg total) daily by mouth. Take with or immediately following a meal. 09/01/17 08/27/18 Yes Croitoru, Mihai, MD  Multiple Vitamins-Minerals (PRESERVISION AREDS 2 PO) Take 1 tablet by mouth daily.   Yes [provider]  polyethylene glycol (MIRALAX /  GLYCOLAX) packet Take 17 g by mouth every evening. 01/21/17  Yes Croitoru, Mihai, MD  potassium chloride (K-DUR) 10 MEQ tablet TAKE 1 TABLET BY MOUTH DAILY ALONG WITH LASIX FOR 2 TO 4 DAYS THEN STOP Patient taking differently: Take 10 mEq by mouth daily. TAKE 1 TABLET BY MOUTH DAILY ALONG WITH LASIX FOR 2 TO 4 DAYS THEN STOP 07/20/18  Yes Burnell Blanks, MD  sulfamethoxazole-trimethoprim (BACTRIM DS,SEPTRA DS) 800-160 MG tablet Take 1 tablet by mouth 2 (two) times daily.   Yes [provider]  Triamcinolone Acetonide (TRIAMCINOLONE 0.1 % CREAM : EUCERIN) CREA Apply 1 application topically as needed for rash or itching.   Yes [provider]    Physical Exam: Vitals:   08/07/18 0625 08/07/18 0630 08/07/18 0645 08/07/18 0740  BP: (!) 88/64 91/63 91/67  (!) 86/63  Pulse: 93 92 92 92  Resp: (!) 29 10 18 18   Temp:      TempSrc:  SpO2: 98% 99% 100% 96%  Weight:      Height:        Constitutional: Febrile.  Looks acutely ill, but currently in NAD. Eyes: PERRL, lids and conjunctivae mild injected. ENMT: Mucous membranes are mildly dry. Posterior pharynx clear of any exudate or lesions. Neck: normal, supple, no masses, no thyromegaly Respiratory: Decreased breath sounds on bases, no wheezing, no crackles. Normal respiratory effort. No accessory muscle use.  Cardiovascular: Regular rate and rhythm, no murmurs / rubs / gallops.  2+ lower extremities edema. 2+ pedal pulses. No carotid bruits.  Abdomen: Soft, no tenderness, no masses palpated. No hepatosplenomegaly. Bowel sounds positive.  Musculoskeletal: no clubbing / cyanosis.  Good ROM, no contractures. Normal muscle tone.  Skin: There are some ecchymosis areas and hyperpigmented macules on extremities, but no rashes, lesions, ulcers on very limited dermatological examination. Neurologic: Moves all extremities and follows simple commands.  Grossly nonfocal. Psychiatric: Alert and oriented x 1, knows he is in the  hospital.  Confused.  Labs on Admission: I have personally reviewed following labs and imaging studies  CBC: Recent Labs  Lab 08/07/18 0512  WBC 8.4  NEUTROABS 7.0  HGB 13.4  HCT 38.4*  MCV 95.8  PLT 694*   Basic Metabolic Panel: Recent Labs  Lab 08/07/18 0512  NA 134*  K 3.6  CL 100  CO2 26  GLUCOSE 164*  BUN 16  CREATININE 1.30*  CALCIUM 8.8*   GFR: Estimated Creatinine Clearance: 46.6 mL/min (A) (by C-G formula based on SCr of 1.3 mg/dL (H)). Liver Function Tests: Recent Labs  Lab 08/07/18 0512  AST 21  ALT 15  ALKPHOS 85  BILITOT 1.7*  PROT 6.3*  ALBUMIN 3.3*   No results for input(s): LIPASE, AMYLASE in the last 168 hours. No results for input(s): AMMONIA in the last 168 hours. Coagulation Profile: Recent Labs  Lab 08/07/18 0512  INR 1.13   Cardiac Enzymes: No results for input(s): CKTOTAL, CKMB, CKMBINDEX, TROPONINI in the last 168 hours. BNP (last 3 results) No results for input(s): PROBNP in the last 8760 hours. HbA1C: No results for input(s): HGBA1C in the last 72 hours. CBG: No results for input(s): GLUCAP in the last 168 hours. Lipid Profile: No results for input(s): CHOL, HDL, LDLCALC, TRIG, CHOLHDL, LDLDIRECT in the last 72 hours. Thyroid Function Tests: No results for input(s): TSH, T4TOTAL, FREET4, T3FREE, THYROIDAB in the last 72 hours. Anemia Panel: No results for input(s): VITAMINB12, FOLATE, FERRITIN, TIBC, IRON, RETICCTPCT in the last 72 hours. Urine analysis:    Component Value Date/Time   COLORURINE AMBER (A) 08/07/2018 0538   APPEARANCEUR TURBID (A) 08/07/2018 0538   LABSPEC 1.015 08/07/2018 0538   PHURINE 6.0 08/07/2018 0538   GLUCOSEU NEGATIVE 08/07/2018 0538   HGBUR MODERATE (A) 08/07/2018 0538   BILIRUBINUR NEGATIVE 08/07/2018 [place] 08/07/2018 0538   PROTEINUR 100 (A) 08/07/2018 0538   NITRITE NEGATIVE 08/07/2018 0538   LEUKOCYTESUR LARGE (A) 08/07/2018 0538    Radiological Exams on  Admission: Dg Chest 2 View  Result Date: 08/07/2018 CLINICAL DATA:  Possible sepsis. EXAM: CHEST - 2 VIEW COMPARISON:  Chest radiograph July 03, 2018 FINDINGS: Stable cardiomegaly. Calcified aortic arch. LEFT AICD in situ. Mild bronchitic changes. Blunted LEFT costophrenic angle with strandy densities LEFT lung base. No pneumothorax. Soft tissue planes and included osseous structures are non suspicious. IMPRESSION: 1. Stable cardiomegaly. 2. Small LEFT pleural effusion with LEFT lung base atelectasis/scarring. 3.  Aortic Atherosclerosis (ICD10-I70.0). Electronically Signed  By: Elon Alas M.D.   On: 08/07/2018 06:34   07/03/2018 echocardiogram ------------------------------------------------------------------- LV EF: 20% -   25%  ------------------------------------------------------------------- Indications:      CHF - 428.0.  ------------------------------------------------------------------- History:   PMH:  NICM. Nonobstructive CAD. VT.  ------------------------------------------------------------------- Study Conclusions  - Left ventricle: The cavity size was normal. Wall thickness was   increased in a pattern of moderate LVH. Systolic function was   severely reduced. The estimated ejection fraction was in the   range of 20% to 25%. Diffuse hypokinesis. Doppler parameters are   consistent with abnormal left ventricular relaxation (grade 1   diastolic dysfunction). Doppler parameters are consistent with   high ventricular filling pressure. - Aortic valve: Valve mobility was restricted. There was severe   stenosis. Valve area (VTI): 0.76 cm^2. Valve area (Vmax): 0.56   cm^2. Valve area (Vmean): 0.66 cm^2. - Aortic root: The aortic root was mildly dilated. - Mitral valve: Calcified annulus. Mildly thickened leaflets .   There was mild regurgitation. - Left atrium: The atrium was severely dilated. - Right atrium: The atrium was mildly dilated. - Pulmonary arteries:  Systolic pressure was mildly increased. PA   peak pressure: 36 mm Hg (S).  Impressions:  - Severe global reduction in LV systolic function; mild diastolic   dysfunction; moderate LVH; mildly dilated aortic root; severe AS;   mild MR; biatrial enlargement; mild TR with mildly elevated   pulmonary pressure.   EKG: Independently reviewed.  Vent. rate 93 BPM PR interval * ms QRS duration 143 ms QT/QTc 406/505 ms P-R-T axes 21 -65 105 Sinus rhythm Borderline prolonged PR interval RBBB and LAFB Baseline wander in lead(s) V4  Assessment/Plan Principal Problem:   Sepsis secondary to UTI [facility])   Septic shock Initially admitted to stepdown, but admitted to the ICU now due to hypotension. He is currently on pressors due to hypotension despite getting 1500 mL of NS bolus. IV fluids were not push further due to chronic combined systolic/diastolic HF with an EF of 20 to 25% and grade 1 diastolic dysfunction.  I started him on Neo-Synephrine and consulted PCCM. Continue supplemental oxygen. Continue cefepime per pharmacy. Follow-up blood culture and sensitivity. Follow-up urine culture and sensitivity.  Active Problems:   HCAP (healthcare-associated pneumonia) Continue supplemental oxygen. On vancomycin and cefepime. Check strep pneumonia urinary antigen. Bronchodilators as needed.    Sustained VT (ventricular tachycardia) (HCC) Continue amiodarone. Monitor and optimize electrolytes as needed.    Hyperlipidemia Continue Vytorin 10-20 mg p.o. at bedtime. Monitor LFTs as needed. Fasting lipid profile follow-up as an outpatient.    OSA (obstructive sleep apnea) - presumptive diagnosis No longer an issue after he decreased weight.    Coronary atherosclerosis Continue aspirin and Vytorin. Hold metoprolol due to hypotension.    Nonrheumatic aortic valve stenosis Scheduled for surgery next week.    Chronic systolic CHF (congestive heart failure) (HCC) Hold furosemide and  metoprolol due to hypotension. Mature intake and output. Monitor daily weights.    Elevated bilirubin Follow-up level.    Dementia Supportive care.     DVT prophylaxis: Heparin SQ. Code Status: Full code. Family Communication: I was present in the ED room.) Disposition Plan: Admit for IV antibiotics for 2 to 3 days. Consults called: Admission status: Inpatient/stepdown.   Reubin Milan MD [facility] Pager [phone].  If 7PM-7AM, please contact night-coverage [email or web address] Password [facility]  08/07/2018, 7:42 AM

## 2018-08-07 NOTE — Progress Notes (Signed)
Pharmacy Antibiotic Note  William [name] is a 84 y.o. male admitted on 08/07/2018 with UTI/sepsis.  Pharmacy has been consulted for cefepime dosing.  Plan: Cefepime 1g q 24 hrs. Monitor renal function, cultures and clinical course.  Height: 5\' 11"  (180.3 cm) Weight: 228 lb 13.4 oz (103.8 kg) IBW/kg (Calculated) : 75.3  Temp (24hrs), Avg:99.3 F (37.4 C), Min:98.3 F (36.8 C), Max:100.8 F (38.2 C)  Recent Labs  Lab 08/07/18 0512 08/07/18 0522 08/07/18 0925 08/07/18 0933  WBC 8.4  --   --   --   CREATININE 1.30*  --   --   --   LATICACIDVEN  --  1.94* 1.7 1.54    Estimated Creatinine Clearance: 48.2 mL/min (A) (by C-G formula based on SCr of 1.3 mg/dL (H)).    Allergies  Allergen Reactions  . Crestor [Rosuvastatin] Other (See Comments)    Hurts muscles  . Lipitor [Atorvastatin] Other (See Comments)    Hurts stomach    Antimicrobials this admission:  Azithromycin 10/25 >>  Cefepime 10/25> Ceftriaxone 10/25 >> 10/25 Bactrim (PTA) 10/24 > 10/25  Dose adjustments this admission:   Microbiology results:  10/25 BCx x 2:  10/25 UCx: pending 10/25 MRSA PCR: pending  Thank you for allowing pharmacy to be a part of this patient's care.  Marguerite Olea, [facility] Clinical Pharmacist Phone [phone]  08/07/2018 3:15 PM

## 2018-08-07 NOTE — ED Notes (Signed)
ED Provider at bedside. 

## 2018-08-07 NOTE — ED Provider Notes (Signed)
[facility] EMERGENCY DEPARTMENT Provider Note   CSN: [id number] Arrival date & time: 08/07/18  0500     History   Chief Complaint Chief Complaint  Patient presents with  . Altered Mental Status  . Urinary Tract Infection    HPI William [name] is a 84 y.o. male.  Patient with history of CHF status post AICD placement, aortic stenosis scheduled for replacement in 4 days presenting from home with generalized weakness, fever, fatigue and increased confusion.  Wife states he was diagnosed with a UTI 1 day ago by his PCP.  She noticed that his urine was cloudy and the UA was positive.  He was given 1 dose of Bactrim.  Today when she try to get him out of bed he was confused and more weak than usual and will be get out of bed without assistance.  Patient denies any complaints.  Wife reports he is weak in his legs and seems to be more confused than usual.  He denies headache, chills, nausea, vomiting.  No abdominal pain or back pain.  No chest pain or shortness of breath.  Level 5 caveat for dementia.  The history is provided by the patient, a relative and the EMS personnel.  Altered Mental Status    Urinary Tract Infection      Past Medical History:  Diagnosis Date  . AAA (abdominal aortic aneurysm) ([place])    7/13 3.8cm  . Arthritis    "joints" (01/11/2014)  . Asthma    "seasonal; some foods"   . Automatic implantable cardioverter-defibrillator in situ   . Chronic systolic CHF (congestive heart failure) ([place])   . Dyslipidemia   . Hemorrhage    Post-colonostomy  . HLD (hyperlipidemia)   . Lumbar vertebral fracture ([facility])    L1- 04/11/2014   . Prostate cancer ([place])   . S/P ICD (internal cardiac defibrillator) procedure, 01/11/14 removal of ERI gen and placement of Medtronic Evera XT VR & NEW Right ventricular lead Medtronic 01/12/2014  . Sleep apnea    "lost 60# & don't have it anymore" (01/11/2014)  . Ventricular tachycardia [facility])     Patient Active Problem  List   Diagnosis Date Noted  . Nonrheumatic aortic valve stenosis   . Acute on chronic systolic heart failure ([place]) 07/02/2018  . Encounter for monitoring amiodarone therapy 01/20/2017  . Prostate cancer ([place]) 01/20/2014  . Cardiomyopathy ([place]) 01/12/2014  . S/P ICD (internal cardiac defibrillator) procedure, 01/11/14 removal of ERI gen and placement of Medtronic Evera XT VR & NEW Right ventricular lead Medtronic 01/12/2014  . Sustained VT (ventricular tachycardia) ([place]) 05/28/2013  . Nonischemic cardiomyopathy ([place]) 05/28/2013  . Hyperlipidemia 05/28/2013  . OSA (obstructive sleep apnea) - presumptive diagnosis 05/28/2013  . AAA (abdominal aortic aneurysm) ([place]) 05/28/2013  . Coronary atherosclerosis 05/28/2013    Past Surgical History:  Procedure Laterality Date  . CARDIAC CATHETERIZATION  08/20/2004   noncritical CAD,mild global hypokinesis, EF 50%  . CARDIAC DEFIBRILLATOR PLACEMENT  08/23/2004   Medtronic  . CATARACT EXTRACTION W/ INTRAOCULAR LENS IMPLANT Right   . COLECTOMY  1990's  . CRYOABLATION N/A 02/24/2014   Procedure: CRYO ABLATION PROSTATE;  Surgeon: Ailene Rud, MD;  Location: [facility] ORS;  Service: Urology;  Laterality: N/A;  . HERNIA REPAIR     "abdomen; from colon OR"  . ICD GENERATOR CHANGE  01/11/2014   "upgrade"  . IMPLANTABLE CARDIOVERTER DEFIBRILLATOR (ICD) GENERATOR CHANGE N/A 01/11/2014   Procedure: ICD GENERATOR CHANGE;  Surgeon: Sanda Klein, MD;  Location: [facility] CATH LAB;  Service: Cardiovascular;  Laterality: N/A;  . LEAD REVISION N/A 01/11/2014   Procedure: LEAD REVISION;  Surgeon: Sanda Klein, MD;  Location: [facility] CATH LAB;  Service: Cardiovascular;  Laterality: N/A;  . NM MYOCAR PERF WALL MOTION  01/29/2012   abnormal c/o infarct/scar,no ischemia present  . PROSTATE BIOPSY N/A 11/22/2013   Procedure: PROSTATE BIOPSY AND ULTRASOUND;  Surgeon: Ailene Rud, MD;  Location: [facility] ORS;  Service: Urology;  Laterality: N/A;  . RIGHT/LEFT HEART CATH AND  CORONARY ANGIOGRAPHY N/A 07/06/2018   Procedure: RIGHT/LEFT HEART CATH AND CORONARY ANGIOGRAPHY;  Surgeon: Troy Sine, MD;  Location: [facility] CV LAB;  Service: Cardiovascular;  Laterality: N/A;        Home Medications    Prior to Admission medications   Medication Sig Start Date End Date Taking? Authorizing Provider  acetaminophen (TYLENOL) 500 MG tablet Take 500 mg by mouth every 6 (six) hours as needed (for pain).     [provider]  amiodarone (PACERONE) 200 MG tablet TAKE 1/2 TABLET BY MOUTH EVERY DAY Patient taking differently: Take 100 mg by mouth daily.  05/12/18   Croitoru, Mihai, MD  aspirin 81 MG chewable tablet Chew 1 tablet (81 mg total) by mouth daily. 07/08/18   Kroeger, Lorelee Cover., PA-C  cholecalciferol (VITAMIN D) 1000 units tablet Take 2,000 Units by mouth at bedtime.    [provider]  ezetimibe-simvastatin (VYTORIN) 10-20 MG per tablet Take 1 tablet by mouth at bedtime.    [provider]  furosemide (LASIX) 20 MG tablet Take 1 tablet (20 mg total) by mouth daily. 07/08/18 10/06/18  Croitoru, Mihai, MD  metoprolol succinate (TOPROL-XL) 50 MG 24 hr tablet Take 1 tablet (50 mg total) daily by mouth. Take with or immediately following a meal. 09/01/17 08/27/18  Croitoru, Mihai, MD  Multiple Vitamins-Minerals (PRESERVISION AREDS 2 PO) Take 1 tablet by mouth daily.    [provider]  polyethylene glycol (MIRALAX / GLYCOLAX) packet Take 17 g by mouth every evening. 01/21/17   Croitoru, Mihai, MD  potassium chloride (K-DUR) 10 MEQ tablet TAKE 1 TABLET BY MOUTH DAILY ALONG WITH LASIX FOR 2 TO 4 DAYS THEN STOP Patient taking differently: Take 10 mEq by mouth daily. TAKE 1 TABLET BY MOUTH DAILY ALONG WITH LASIX FOR 2 TO 4 DAYS THEN STOP 07/20/18   Burnell Blanks, MD  Triamcinolone Acetonide (TRIAMCINOLONE 0.1 % CREAM : EUCERIN) CREA Apply 1 application topically as needed for rash or itching.    [provider]    Family  History Family History  Problem Relation Age of Onset  . Heart failure Mother        Died of "old age" at 38  . Pneumonia Father     Social History Social History   Tobacco Use  . Smoking status: Never Smoker  . Smokeless tobacco: Never Used  Substance Use Topics  . Alcohol use: Yes    Alcohol/week: 0.0 standard drinks    Comment: 01/11/2014 "drink 1/2 of a beer twice per month   . Drug use: No     Allergies   Crestor [rosuvastatin] and Lipitor [atorvastatin]   Review of Systems Review of Systems  Unable to perform ROS: Dementia     Physical Exam Updated Vital Signs BP 98/73 (BP Location: Right Arm)   Pulse (!) 101   Temp (S) (!) 100.8 F (38.2 C) (Rectal)   Resp (!) 22   Ht 5\' 10"  (1.778 m)  Wt 100 kg   SpO2 96%   BMI 31.63 kg/m   Physical Exam  Constitutional: He appears well-developed and well-nourished. No distress.  HENT:  Head: Normocephalic and atraumatic.  Mouth/Throat: Oropharynx is clear and moist. No oropharyngeal exudate.  Mildly dry mucous membranes  Eyes: Pupils are equal, round, and reactive to light. Conjunctivae and EOM are normal.  Neck: Normal range of motion. Neck supple.  No meningismus.  Cardiovascular: Normal rate, regular rhythm, normal heart sounds and intact distal pulses.  No murmur heard. Pulmonary/Chest: Effort normal and breath sounds normal. No respiratory distress.  Abdominal: Soft. There is no tenderness. There is no rebound and no guarding.  Genitourinary:  Genitourinary Comments: Foul-smelling urine  Musculoskeletal: Normal range of motion. He exhibits no edema or tenderness.  Neurological: He is alert. No cranial nerve deficit. He exhibits normal muscle tone. Coordination normal.  Oriented to person only, 5/5 strength throughout Says he does not know why he is here.  Skin: Skin is warm.  Psychiatric: He has a normal mood and affect. His behavior is normal.  Nursing note and vitals reviewed.    ED Treatments /  Results  Labs (all labs ordered are listed, but only abnormal results are displayed) Labs Reviewed  COMPREHENSIVE METABOLIC PANEL - Abnormal; Notable for the following components:      Result Value   Sodium 134 (*)    Glucose, Bld 164 (*)    Creatinine, Ser 1.30 (*)    Calcium 8.8 (*)    Total Protein 6.3 (*)    Albumin 3.3 (*)    Total Bilirubin 1.7 (*)    GFR calc non Af Amer 47 (*)    GFR calc Af Amer 55 (*)    All other components within normal limits  CBC WITH DIFFERENTIAL/PLATELET - Abnormal; Notable for the following components:   RBC 4.01 (*)    HCT 38.4 (*)    Platelets 137 (*)    Lymphs Abs 0.5 (*)    All other components within normal limits  URINALYSIS, ROUTINE W REFLEX MICROSCOPIC - Abnormal; Notable for the following components:   Color, Urine AMBER (*)    APPearance TURBID (*)    Hgb urine dipstick MODERATE (*)    Protein, ur 100 (*)    Leukocytes, UA LARGE (*)    WBC, UA >50 (*)    Bacteria, UA FEW (*)    All other components within normal limits  I-STAT CG4 LACTIC ACID, ED - Abnormal; Notable for the following components:   Lactic Acid, Venous 1.94 (*)    All other components within normal limits  CULTURE, BLOOD (ROUTINE X 2)  CULTURE, BLOOD (ROUTINE X 2)  URINE CULTURE  PROTIME-INR  MAGNESIUM  PHOSPHORUS  I-STAT CG4 LACTIC ACID, ED    EKG EKG Interpretation  Date/Time:  Friday August 07 2018 06:25:55 EDT Ventricular Rate:  93 PR Interval:    QRS Duration: 143 QT Interval:  406 QTC Calculation: 505 R Axis:   -65 Text Interpretation:  Sinus rhythm Borderline prolonged PR interval RBBB and LAFB Baseline wander in lead(s) V4 No significant change was found Confirmed by Ezequiel Essex [phone]) on 08/07/2018 6:32:53 AM   Radiology Dg Chest 2 View  Result Date: 08/07/2018 CLINICAL DATA:  Possible sepsis. EXAM: CHEST - 2 VIEW COMPARISON:  Chest radiograph July 03, 2018 FINDINGS: Stable cardiomegaly. Calcified aortic arch. LEFT AICD in situ.  Mild bronchitic changes. Blunted LEFT costophrenic angle with strandy densities LEFT lung base. No pneumothorax. Soft tissue  planes and included osseous structures are non suspicious. IMPRESSION: 1. Stable cardiomegaly. 2. Small LEFT pleural effusion with LEFT lung base atelectasis/scarring. 3.  Aortic Atherosclerosis (ICD10-I70.0). Electronically Signed   By: Elon Alas M.D.   On: 08/07/2018 06:34    Procedures Procedures (including critical care time)  Medications Ordered in ED Medications  cefTRIAXone (ROCEPHIN) 1 g in sodium chloride 0.9 % 100 mL IVPB (has no administration in time range)  sodium chloride 0.9 % bolus 500 mL (500 mLs Intravenous New Bag/Given 08/07/18 0526)     Initial Impression / Assessment and Plan / ED Course  I have reviewed the triage vital signs and the nursing notes.  Pertinent labs & imaging results that were available during my care of the patient were reviewed by me and considered in my medical decision making (see chart for details).    Patient from home with confusion, recent UTI, weakness and fever. Denies pain. Oriented x1. Near baseline per wife. Scheduled for aortic valve replacement in 4 days.  Code sepsis activated.  Will start broad-spectrum antibiotics.  Gentle hydration given patient's poor ejection fraction.  Lactate 1.9.  Broad-spectrum antibiotic started after cultures obtained.  Gentle hydration given.  Blood pressure remains soft around 90 systolic.  Patient is asymptomatic from this. Will hydrate gently given his history of CHF.  UA consistent with UTI. Small pleural effusion on Xray.  Treat for possible CAP as well.  Admission discussed with Dr. Olevia Bowens.  CRITICAL CARE Performed by: Ezequiel Essex Total critical care time: 35 minutes Critical care time was exclusive of separately billable procedures and treating other patients. Critical care was necessary to treat or prevent imminent or life-threatening deterioration. Critical  care was time spent personally by me on the following activities: development of treatment plan with patient and/or surrogate as well as nursing, discussions with consultants, evaluation of patient's response to treatment, examination of patient, obtaining history from patient or surrogate, ordering and performing treatments and interventions, ordering and review of laboratory studies, ordering and review of radiographic studies, pulse oximetry and re-evaluation of patient's condition.   Fin Clinical Impressions(s) / ED Diagnoses   Final diagnoses:  Sepsis secondary to UTI [facility])    ED Discharge Orders    None       Ezequiel Essex, MD 08/07/18 [phone]

## 2018-08-07 NOTE — ED Triage Notes (Signed)
Pt BIB EMS from home. Recent dx'd with UTI and started on abx. Wife noted around 0400 that pt was more confused and weak. Pt unable to get out of bed with bilat weakness to LEs. Pt alert, but only oriented to self. Dx'd with early onset dementia, but per EMS, wife states current confusion worse that earlier today.

## 2018-08-07 NOTE — ED Notes (Signed)
Report given to nurse on 4N -ICU

## 2018-08-08 ENCOUNTER — Inpatient Hospital Stay (HOSPITAL_COMMUNITY): Payer: Medicare Other

## 2018-08-08 DIAGNOSIS — I350 Nonrheumatic aortic (valve) stenosis: Secondary | ICD-10-CM

## 2018-08-08 DIAGNOSIS — R579 Shock, unspecified: Secondary | ICD-10-CM

## 2018-08-08 DIAGNOSIS — L8990 Pressure ulcer of unspecified site, unspecified stage: Secondary | ICD-10-CM

## 2018-08-08 DIAGNOSIS — I5022 Chronic systolic (congestive) heart failure: Secondary | ICD-10-CM

## 2018-08-08 LAB — COMPREHENSIVE METABOLIC PANEL WITH GFR
ALT: 12 U/L (ref 0–44)
AST: 20 U/L (ref 15–41)
Albumin: 2.9 g/dL — ABNORMAL LOW (ref 3.5–5.0)
Alkaline Phosphatase: 82 U/L (ref 38–126)
Anion gap: 8 (ref 5–15)
BUN: 14 mg/dL (ref 8–23)
CO2: 23 mmol/L (ref 22–32)
Calcium: 8.4 mg/dL — ABNORMAL LOW (ref 8.9–10.3)
Chloride: 107 mmol/L (ref 98–111)
Creatinine, Ser: 1.13 mg/dL (ref 0.61–1.24)
GFR calc Af Amer: >60 mL/min (ref >60)
GFR calc non Af Amer: 56 mL/min — ABNORMAL LOW (ref >60)
Glucose, Bld: 121 mg/dL — ABNORMAL HIGH (ref 70–99)
Potassium: 4 mmol/L (ref 3.5–5.1)
Sodium: 138 mmol/L (ref 135–145)
Total Bilirubin: 1.5 mg/dL — ABNORMAL HIGH (ref 0.3–1.2)
Total Protein: 6.1 g/dL — ABNORMAL LOW (ref 6.5–8.1)

## 2018-08-08 LAB — CBC WITH DIFFERENTIAL/PLATELET
ABS IMMATURE GRANULOCYTES: 0.04 10*3/uL (ref 0.00–0.07)
Basophils Absolute: 0 10*3/uL (ref 0.0–0.1)
Basophils Relative: 0 %
EOS ABS: 0.4 10*3/uL (ref 0.0–0.5)
Eosinophils Relative: 5 %
HCT: 36.1 % — ABNORMAL LOW (ref 39.0–52.0)
Hemoglobin: 12.6 g/dL — ABNORMAL LOW (ref 13.0–17.0)
IMMATURE GRANULOCYTES: 0 %
LYMPHS ABS: 1.4 10*3/uL (ref 0.7–4.0)
Lymphocytes Relative: 15 %
MCH: 33.7 pg (ref 26.0–34.0)
MCHC: 34.9 g/dL (ref 30.0–36.0)
MCV: 96.5 fL (ref 80.0–100.0)
MONOS PCT: 10 %
Monocytes Absolute: 0.9 10*3/uL (ref 0.1–1.0)
NEUTROS ABS: 6.2 10*3/uL (ref 1.7–7.7)
NEUTROS PCT: 70 %
Platelets: 150 10*3/uL (ref 150–400)
RBC: 3.74 MIL/uL — ABNORMAL LOW (ref 4.22–5.81)
RDW: 15.9 % — ABNORMAL HIGH (ref 11.5–15.5)
WBC: 9 10*3/uL (ref 4.0–10.5)
nRBC: 0 % (ref 0.0–0.2)

## 2018-08-08 LAB — TROPONIN I: TROPONIN I: 0.03 ng/mL — AB (ref <0.03)

## 2018-08-08 LAB — PROCALCITONIN: Procalcitonin: <0.1 ng/mL

## 2018-08-08 MED ORDER — SODIUM CHLORIDE 0.9 % IV SOLN
1.0000 g | INTRAVENOUS | Status: AC
  Administered 2018-08-08 – 2018-08-11 (×4): 1 g via INTRAVENOUS
  Filled 2018-08-08 (×4): qty 10

## 2018-08-08 MED ORDER — FUROSEMIDE 10 MG/ML IJ SOLN
40.0000 mg | Freq: Once | INTRAMUSCULAR | Status: AC
  Administered 2018-08-08: 40 mg via INTRAVENOUS
  Filled 2018-08-08: qty 4

## 2018-08-08 NOTE — Progress Notes (Signed)
eLink Physician-Brief Progress Note Patient Name: William [name] DOB: [birth year] MRN: [id number]   Date of Service  08/08/2018  HPI/Events of Note  resp distress - sounds 'wet' per RN +3L Known AS/ EF 20%  eICU Interventions  Lasix 40 IV x 1 BP improved - taper neo to off to avoid excess fluids biPAP /pCXR prn     Intervention Category Intermediate Interventions: Respiratory distress - evaluation and management  William [name] 08/08/2018, 12:47 AM

## 2018-08-08 NOTE — Progress Notes (Addendum)
PULMONARY / CRITICAL CARE MEDICINE   NAME:  William [name], MRN:  [id number], DOB:  11/20/1927, LOS: 1 ADMISSION DATE:  08/07/2018, CONSULTATION DATE: 08/07/2018 REFERRING MD: Dr. Wyvonnia Dusky, CHIEF COMPLAINT: Shock    BRIEF HISTORY:    84 year old M admitted 10/25 with sepsis from presumed urinary tract infection.  Recent Rx for UTI with a sulfa drug. After taking the first dose, he became confused and hypotensive.  Has AS with plan for valvuloplasty, LVEF 20%, VT s/p AICD, prostate CA with frequent UTI's.   SIGNIFICANT PAST MEDICAL HISTORY   Congestive heart failure Aortic valve stenosis Recent urinary tract infection Ventricular tachycardia with AICD in place  SIGNIFICANT EVENTS:  10/25  Admit with sepsis, suspected UTI   STUDIES:     CULTURES:  UA 10/25 >> large leukocytes, few bacteria, WBC >50 UC 10/25 >> greater 100k GNR >>  Klebsiella  BCx2 10/25 >>  U. Strep Antigen 10/25 >>  ANTIBIOTICS:  Rocephin 10/25 >> Zithromax 10/25 >> 10/26 Cefepime 10/25 >> 10/26  LINES/TUBES:    CONSULTANTS:    SUBJECTIVE:  RN reports attempts at weaning neo led to hypotension.  Neo at 32mcg's.    CONSTITUTIONAL: BP 101/80   Pulse (!) 101   Temp 98.4 F (36.9 C) (Oral)   Resp 18   Ht 5\' 11"  (1.803 m)   Wt 103.8 kg   SpO2 99%   BMI 31.92 kg/m   I/O last 3 completed shifts: In: 3980.3 [I.V.:1630.3; IV Piggyback:2350] Out: 750 [Urine:750]        PHYSICAL EXAM: General: frail elderly male in NAD lying in bed, wife at bedside  HEENT: MM pink/moist Neuro: AAOx4, speech clear, MAE CV: s1s2 rrr, no m/r/g, diminished heart tones  PULM: even/non-labored, lungs bilaterally clear MV:HQIO, non-tender, bsx4 active  Extremities: warm/dry, no edema  Skin: no rashes or lesions  RESOLVED PROBLEM LIST    ASSESSMENT AND PLAN    Shock/hypotension in the setting of suspected urinary tract infection P: Continue abx as above  Follow cultures  Caution with IVF volume given  CHF Neo for MAP >65  Narrow abx to rocephin      Hx of AV disease plan for valvuloplasty in near future.  Followed by Dr. Cyndia Bent  History of AAA, CHF / Cardiomyopathy, HLD P: ICU monitoring  Hold surgical interventions for now Cardiology aware of admit. Informed at wife's request.  Continue ASA, vytorin   Hx VT s/p AICD  P: Tele monitoring  Reschedule metoprolol for 10/27.  May need to hold but will revisit dosing in am.  Continue amiodarone Neo as above   Hx Dementia P: Promote sleep / wake cycle Minimize any sedating medications   Suspicion of OSA P: Consider outpatient sleep evaluation   SUMMARY OF TODAY'S PLAN:  Follow in ICU, wean neo to off for MAP >65.     Best Practice / Goals of Care / Disposition.   DVT PROPHYLAXIS: Heparin SQ SUP: PPI NUTRITION: Heart healthy diet MOBILITY: Up with assistance GOALS OF CARE: Full code FAMILY DISCUSSIONS: Patient and wife updated at bedside.  DISPOSITION ICU  LABS  Glucose No results for input(s): GLUCAP in the last 168 hours.  BMET Recent Labs  Lab 08/07/18 0512 08/07/18 1411 08/08/18 0012  NA 134* 138 138  K 3.6 3.2* 4.0  CL 100 107 107  CO2 26 25 23   BUN 16 12 14   CREATININE 1.30* 1.16 1.13  GLUCOSE 164* 102* 121*    Liver Enzymes Recent Labs  Lab  08/07/18 0512 08/07/18 1411 08/08/18 0012  AST 21 20 20   ALT 15 13 12   ALKPHOS 85 85 82  BILITOT 1.7* 1.8* 1.5*  ALBUMIN 3.3* 3.0* 2.9*    Electrolytes Recent Labs  Lab 08/07/18 0512 08/07/18 1411 08/08/18 0012  CALCIUM 8.8* 8.2* 8.4*  MG 2.2 2.2  --   PHOS 3.6 3.3  --     CBC Recent Labs  Lab 08/07/18 0512 08/07/18 1411 08/08/18 0012  WBC 8.4 8.3 9.0  HGB 13.4 12.5* 12.6*  HCT 38.4* 36.0* 36.1*  PLT 137* 147* 150    ABG No results for input(s): PHART, PCO2ART, PO2ART in the last 168 hours.  Coag's Recent Labs  Lab 08/07/18 0512 08/07/18 1411  APTT  --  34  INR 1.13 1.25    Sepsis Markers Recent Labs  Lab  08/07/18 0925 08/07/18 0933 08/07/18 1411 08/07/18 1816 08/08/18 0012  LATICACIDVEN 1.7 1.54 1.4  --   --   PROCALCITON  --   --   --  <0.10 <0.10    Cardiac Enzymes Recent Labs  Lab 08/07/18 1411 08/07/18 1816 08/08/18 0012  TROPONINI 0.03* <0.03 0.03*      William Gens, NP-C [facility] Pgr: [phone] or if no answer [phone] 08/08/2018, 9:20 AM   Attending note: Pt seen and examined and labs reviewed. He has klebsiella UTI /sepsis s obvious obstruction issues and on only min doses of Neosynephrine so no change in rx needed other than to narrow rx to just rocephin as likely sensitive to cephalosporins in this setting Discussed with wife at bedside.   The patient is critically ill with multiple organ systems failure and requires high complexity decision making for assessment and support, frequent evaluation and titration of therapies, application of advanced monitoring technologies and extensive interpretation of multiple databases. Critical Care Time devoted to patient care services described in this note is 30  minutes.    William Gully, MD Pulmonary and [place] [phone] After 5:30 PM or weekends, use Beeper [phone]

## 2018-08-09 ENCOUNTER — Inpatient Hospital Stay (HOSPITAL_COMMUNITY): Payer: Medicare Other

## 2018-08-09 DIAGNOSIS — J9601 Acute respiratory failure with hypoxia: Secondary | ICD-10-CM

## 2018-08-09 DIAGNOSIS — J9600 Acute respiratory failure, unspecified whether with hypoxia or hypercapnia: Secondary | ICD-10-CM

## 2018-08-09 LAB — CBC WITH DIFFERENTIAL/PLATELET
ABS IMMATURE GRANULOCYTES: 0.03 10*3/uL (ref 0.00–0.07)
BASOS PCT: 0 %
Basophils Absolute: 0 10*3/uL (ref 0.0–0.1)
EOS ABS: 0.3 10*3/uL (ref 0.0–0.5)
EOS PCT: 6 %
HCT: 33.4 % — ABNORMAL LOW (ref 39.0–52.0)
Hemoglobin: 11.5 g/dL — ABNORMAL LOW (ref 13.0–17.0)
Immature Granulocytes: 1 %
Lymphocytes Relative: 18 %
Lymphs Abs: 1 10*3/uL (ref 0.7–4.0)
MCH: 32.9 pg (ref 26.0–34.0)
MCHC: 34.4 g/dL (ref 30.0–36.0)
MCV: 95.4 fL (ref 80.0–100.0)
MONO ABS: 0.5 10*3/uL (ref 0.1–1.0)
MONOS PCT: 9 %
Neutro Abs: 3.5 10*3/uL (ref 1.7–7.7)
Neutrophils Relative %: 66 %
PLATELETS: 114 10*3/uL — AB (ref 150–400)
RBC: 3.5 MIL/uL — ABNORMAL LOW (ref 4.22–5.81)
RDW: 15.5 % (ref 11.5–15.5)
WBC: 5.3 10*3/uL (ref 4.0–10.5)
nRBC: 0 % (ref 0.0–0.2)

## 2018-08-09 LAB — URINE CULTURE

## 2018-08-09 LAB — PROCALCITONIN: Procalcitonin: <0.1 ng/mL

## 2018-08-09 MED ORDER — METOPROLOL SUCCINATE ER 50 MG PO TB24
50.0000 mg | ORAL_TABLET | Freq: Every day | ORAL | Status: DC
  Administered 2018-08-10 – 2018-08-13 (×3): 50 mg via ORAL
  Filled 2018-08-09 (×4): qty 1

## 2018-08-09 MED ORDER — SULFACETAMIDE SODIUM 10 % OP SOLN
1.0000 [drp] | Freq: Four times a day (QID) | OPHTHALMIC | Status: DC
  Administered 2018-08-09 – 2018-08-13 (×15): 1 [drp] via OPHTHALMIC
  Filled 2018-08-09 (×2): qty 15

## 2018-08-09 NOTE — Progress Notes (Signed)
Orthopedic hand note:  I have been consult regarding a right wrist injury.  I discussed this case with the nurse practitioner on call.  The radiographs appear to demonstrate a chronic distal radial ulnar joint dislocation.  I have ordered an elbow x-ray to assess the proximal radial ulnar joint and forearm for any bony injury there.  Full consult note to come either tonight or first thing tomorrow morning.

## 2018-08-09 NOTE — Progress Notes (Signed)
Pt c/o R wrist pain ? When onset > wrist x ray verbal report "fx"  > ortho consulted  Also concerned "pink eye" with non-specific findings > rx sodium sulamyd  empirically    Christinia Gully, MD Pulmonary and [place] [phone] After 5:30 PM or weekends, use Beeper [phone]

## 2018-08-09 NOTE — Consult Note (Signed)
ORTHOPAEDIC CONSULTATION  REQUESTING PHYSICIAN: Tanda Rockers, MD  PCP:  William Pao, MD  Chief Complaint: Right wrist pain  HPI: William [name] is a 84 y.o. male who complains of acute onset of right wrist pain earlier today.  Per the chart review and discussion with the nurse practitioner earlier today, William [name], they indicated that the patient was attempting to pull off his name band aggressively which is around the right wrist.  Since that time is been complaining of right wrist pain.  He has Alzheimer's dementia and is very good with short term memory but seems to lack the ability to recall any remote history of injury to that wrist.  His wife is currently not here as she has stepped out to get some rest tonight.  He is accompanied by his daughter who helps provide the history.  They did do indicate that he lives independently with his wife in a single family home.  He is right-handed, but has recently been using the left hand more per the daughter.  She does not recall any history of trauma or injury to the right wrist.  Past Medical History:  Diagnosis Date  . AAA (abdominal aortic aneurysm) ([place])    7/13 3.8cm  . Arthritis    "joints" (01/11/2014)  . Asthma    "seasonal; some foods"   . Automatic implantable cardioverter-defibrillator in situ   . Chronic systolic CHF (congestive heart failure) ([place])   . Dementia ([place])   . Dyslipidemia   . Hemorrhage    Post-colonostomy  . HLD (hyperlipidemia)   . Lumbar vertebral fracture ([facility])    L1- 04/11/2014   . Prostate cancer ([place])   . S/P ICD (internal cardiac defibrillator) procedure, 01/11/14 removal of ERI gen and placement of Medtronic Evera XT VR & NEW Right ventricular lead Medtronic 01/12/2014  . Sleep apnea    "lost 60# & don't have it anymore" (01/11/2014)  . Ventricular tachycardia [facility])    Past Surgical History:  Procedure Laterality Date  . CARDIAC CATHETERIZATION  08/20/2004   noncritical CAD,mild global  hypokinesis, EF 50%  . CARDIAC DEFIBRILLATOR PLACEMENT  08/23/2004   Medtronic  . CATARACT EXTRACTION W/ INTRAOCULAR LENS IMPLANT Right   . COLECTOMY  1990's  . CRYOABLATION N/A 02/24/2014   Procedure: CRYO ABLATION PROSTATE;  Surgeon: Ailene Rud, MD;  Location: [facility];  Service: Urology;  Laterality: N/A;  . HERNIA REPAIR     "abdomen; from colon OR"  . ICD GENERATOR CHANGE  01/11/2014   "upgrade"  . IMPLANTABLE CARDIOVERTER DEFIBRILLATOR (ICD) GENERATOR CHANGE N/A 01/11/2014   Procedure: ICD GENERATOR CHANGE;  Surgeon: Sanda Klein, MD;  Location: [place] CATH LAB;  Service: Cardiovascular;  Laterality: N/A;  . LEAD REVISION N/A 01/11/2014   Procedure: LEAD REVISION;  Surgeon: Sanda Klein, MD;  Location: [place] CATH LAB;  Service: Cardiovascular;  Laterality: N/A;  . NM MYOCAR PERF WALL MOTION  01/29/2012   abnormal c/o infarct/scar,no ischemia present  . PROSTATE BIOPSY N/A 11/22/2013   Procedure: PROSTATE BIOPSY AND ULTRASOUND;  Surgeon: Ailene Rud, MD;  Location: [facility];  Service: Urology;  Laterality: N/A;  . RIGHT/LEFT HEART CATH AND CORONARY ANGIOGRAPHY N/A 07/06/2018   Procedure: RIGHT/LEFT HEART CATH AND CORONARY ANGIOGRAPHY;  Surgeon: Troy Sine, MD;  Location: [place] CV LAB;  Service: Cardiovascular;  Laterality: N/A;   Social History   Socioeconomic History  . Marital status: Married    Spouse name: Not on file  .  Number of children: 2  . Years of education: Not on file  . Highest education level: Not on file  Occupational History  . Occupation: Owned a Scientist, [employer]  Social Needs  . Financial resource strain: Not on file  . Food insecurity:    Worry: Not on file    Inability: Not on file  . Transportation needs:    Medical: Not on file    Non-medical: Not on file  Tobacco Use  . Smoking status: Never Smoker  . Smokeless tobacco: Never Used  Substance and Sexual Activity  . Alcohol use: Yes    Alcohol/week: 0.0 standard drinks     Comment: 01/11/2014 "drink 1/2 of a beer twice per month   . Drug use: No  . Sexual activity: Not Currently  Lifestyle  . Physical activity:    Days per week: Not on file    Minutes per session: Not on file  . Stress: Not on file  Relationships  . Social connections:    Talks on phone: Not on file    Gets together: Not on file    Attends religious service: Not on file    Active member of club or organization: Not on file    Attends meetings of clubs or organizations: Not on file    Relationship status: Not on file  Other Topics Concern  . Not on file  Social History Narrative  . Not on file   Family History  Problem Relation Age of Onset  . Heart failure Mother        Died of "old age" at 15  . Pneumonia Father    Allergies  Allergen Reactions  . Crestor [Rosuvastatin] Other (See Comments)    Hurts muscles  . Lipitor [Atorvastatin] Other (See Comments)    Hurts stomach   Prior to Admission medications   Medication Sig Start Date End Date Taking? Authorizing Provider  acetaminophen (TYLENOL) 500 MG tablet Take 500 mg by mouth every 6 (six) hours as needed (for pain).    Yes [provider]  amiodarone (PACERONE) 200 MG tablet TAKE 1/2 TABLET BY MOUTH EVERY DAY Patient taking differently: Take 100 mg by mouth daily.  05/12/18  Yes Croitoru, Mihai, MD  aspirin 81 MG chewable tablet Chew 1 tablet (81 mg total) by mouth daily. 07/08/18  Yes Kroeger, Lorelee Cover., PA-C  cholecalciferol (VITAMIN D) 1000 units tablet Take 2,000 Units by mouth at bedtime.   Yes [provider]  ezetimibe-simvastatin (VYTORIN) 10-20 MG per tablet Take 1 tablet by mouth at bedtime.   Yes [provider]  furosemide (LASIX) 20 MG tablet Take 1 tablet (20 mg total) by mouth daily. 07/08/18 10/06/18 Yes Croitoru, Mihai, MD  metoprolol succinate (TOPROL-XL) 50 MG 24 hr tablet Take 1 tablet (50 mg total) daily by mouth. Take with or immediately following a meal. 09/01/17 08/27/18 Yes  Croitoru, Mihai, MD  Multiple Vitamins-Minerals (PRESERVISION AREDS 2 PO) Take 1 tablet by mouth daily.   Yes [provider]  polyethylene glycol (MIRALAX / GLYCOLAX) packet Take 17 g by mouth every evening. 01/21/17  Yes Croitoru, Mihai, MD  potassium chloride (K-DUR) 10 MEQ tablet TAKE 1 TABLET BY MOUTH DAILY ALONG WITH LASIX FOR 2 TO 4 DAYS THEN STOP Patient taking differently: Take 10 mEq by mouth daily. TAKE 1 TABLET BY MOUTH DAILY ALONG WITH LASIX FOR 2 TO 4 DAYS THEN STOP 07/20/18  Yes Burnell Blanks, MD  sulfamethoxazole-trimethoprim (BACTRIM DS,SEPTRA DS) 800-160 MG tablet Take  1 tablet by mouth 2 (two) times daily.   Yes [provider]  Triamcinolone Acetonide (TRIAMCINOLONE 0.1 % CREAM : EUCERIN) CREA Apply 1 application topically as needed for rash or itching.   Yes [provider]   Dg Elbow 2 Views Right  Result Date: 08/09/2018 CLINICAL DATA:  Extreme dementia.  Concern for elbow injury. EXAM: RIGHT ELBOW - 2 VIEW COMPARISON:  None FINDINGS: Examination is markedly degraded secondary to patient's inability to tolerate conventional positioning four elbow radiographic series. No definite fracture or elbow joint effusion. The elbow is held in persistent flexion. Minimal enthesopathic change involving the medial aspect of the ulna. Joint spaces appear preserved. And IV is seen within the antecubital fossa. Adjacent vascular calcifications. No radiopaque foreign body. IMPRESSION: Degraded examination without definite fracture or elbow joint effusion. Electronically Signed   By: Sandi Mariscal M.D.   On: 08/09/2018 20:04   Dg Wrist 2 Views Right  Result Date: 08/09/2018 CLINICAL DATA:  Pain. EXAM: RIGHT WRIST - 2 VIEW COMPARISON:  None. FINDINGS: Vascular calcifications are noted. The distal ulna appears to be displaced/dislocated in a dorsal direction. There is an associated fracture seen on the lateral view. The radiocarpal joint remains intact. No  definitive radius fracture noted. Carpal bones demonstrate no obvious fracture. There is severe soft tissue swelling over the distal ulna. The distal ulna also overlaps the lunate and trapezium which is abnormal. IMPRESSION: Dislocation of the distal ulna in a dorsal direction with associated fractures. Associated soft tissue swelling. Electronically Signed   By: Dorise Bullion III M.D   On: 08/09/2018 18:09   Dg Chest Port 1 View  Result Date: 08/08/2018 CLINICAL DATA:  Respiratory failure EXAM: PORTABLE CHEST 1 VIEW COMPARISON:  August 07, 2018 FINDINGS: There is persistent cardiomegaly. Pacemaker leads are attached to the right heart, stable. There is pulmonary venous hypertension. There is interstitial pulmonary edema with small pleural effusions bilaterally. There is no consolidation. There is aortic atherosclerosis. No evident bone lesions. IMPRESSION: Cardiomegaly with pulmonary vascular congestion. Small pleural effusions with interstitial edema. Suspect congestive heart failure. No consolidation. There is aortic atherosclerosis. Aortic Atherosclerosis (ICD10-I70.0). Electronically Signed   By: Lowella Grip III M.D.   On: 08/08/2018 01:09    Positive ROS: All other systems have been reviewed and were otherwise negative with the exception of those mentioned in the HPI and as above.  Physical Exam: General: Alert, no acute distress Cardiovascular: 2+ lower extremity edema Respiratory: No cyanosis, no use of accessory musculature GI:  abdomen is soft and non-tender Skin: No lesions in the area of chief complaint Neurologic: responds to stimuli distally Psychiatric: Patient is NOT competent for consent with normal mood and affect Lymphatic: No axillary or cervical lymphadenopathy  MUSCULOSKELETAL:  Right upper extremity is held in a flexed position at the wrist.  No open wounds.  He has some swelling noted along the dorsum of the wrist.  He has mild peripheral prominence noted at the  distal ulna.  The distal ulna is easily able to be reduced with my hand.  This generates pain.  He also has tenderness along the DRUJ and the ulnar aspect of the distal ulna.  He endorses sensation intact throughout the hand.  He has very weak motor in the hand but does fire median, radial, ulnar, AIN, PIN.  2+ radial pulse.  Assessment: Right wrist closed distal radial ulnar joint dorsal dislocation of the ulna.  Plan: -Pulmonary this is not an acute on chronic picture with  pre-existing distal ulna radial joint instability and provocation of that with today's episode of pulling on the name band.  He certainly has tenderness on exam and therefore I will recommend bracing to that wrist.  He is okay to weight-bear through the right upper extremity.  If he has difficulty doing so I would recommend platform weightbearing on his walker.  Currently he has not getting out of bed with therapy. -I did review the elbow x-rays which were obtained which demonstrate no fracture proximally to explain the dislocation of the DR Clelia Croft.  He is not a great surgical candidate due to cardiac issues and current urologic issues.  We will monitor his pain in the brace.    Nicholes Stairs, MD Cell [phone]    08/09/2018 9:10 PM

## 2018-08-09 NOTE — Progress Notes (Addendum)
PULMONARY / CRITICAL CARE MEDICINE   NAME:  William [name], MRN:  [id number], DOB:  [birth year], LOS: 2 ADMISSION DATE:  08/07/2018, CONSULTATION DATE: 08/07/2018 REFERRING MD: Dr. Wyvonnia Dusky, CHIEF COMPLAINT: Shock    BRIEF HISTORY:    84 year old M admitted 10/25 with sepsis from presumed urinary tract infection.  Recent Rx for UTI with a sulfa drug. After taking the first dose, he became confused and hypotensive.  Has AS with plan for valvuloplasty, LVEF 20%, VT s/p AICD, prostate CA with frequent UTI's.   SIGNIFICANT PAST MEDICAL HISTORY   Congestive heart failure Aortic valve stenosis Recent urinary tract infection Ventricular tachycardia with AICD in place  SIGNIFICANT EVENTS:  10/25  Admit with sepsis, suspected UTI  10/27  Off vasopressors  STUDIES:     CULTURES:  UA 10/25 >> large leukocytes, few bacteria, WBC >50 UC 10/25 >> greater 100k GNR >>  Klebsiella >> S-rocephin BCx2 10/25 >>  U. Strep Antigen 10/25 >> not done  ANTIBIOTICS:  Rocephin 10/25 >> Zithromax 10/25 >> 10/26 Cefepime 10/25 >> 10/26  LINES/TUBES:    CONSULTANTS:    SUBJECTIVE:  RN reports pt off neo since 2pm.  Wife concerned about pts heart rate.  CONSTITUTIONAL: BP 105/82   Pulse (!) 110   Temp 98.6 F (37 C) (Oral)   Resp 18   Ht 5\' 11"  (1.803 m)   Wt 101.8 kg   SpO2 95%   BMI 31.30 kg/m   I/O last 3 completed shifts: In: 1789.9 [P.O.:500; I.V.:939.8; IV Piggyback:350.1] Out: 2175 [Urine:2175]        PHYSICAL EXAM: General: frail elderly male lying in bed in NAD HEENT: MM pink/moist, fair dentition  Neuro: Awake/alert, speech clear, MAE, oriented to self, wife, events CV: s1s2 rrr, no m/r/g PULM: even/non-labored, lungs bilaterally clear  JJ:KKXF, non-tender, bsx4 active  Extremities: warm/dry, BLE 1+ edema / changes c/w chronic venous stasis Skin: no rashes or lesions  RESOLVED PROBLEM LIST    ASSESSMENT AND PLAN    Shock/hypotension in the setting of  klebsiella urinary tract infection/ resolved  P: ABX as above  Follow blood cultures  MAP goal >65  Hx of AV disease plan for valvuloplasty in near future.  Followed by Dr. Cyndia Bent  History of AAA, CHF / Cardiomyopathy, HLD P: Monitor in unit overnight with addition of Toprol  Dr. Cyndia Bent will determine timing of surgical interventions Cardiology aware of admit, informed 10/26 at wife's request  Continue ASA & vytorin   Hx VT s/p AICD  P: Monitor tele  Continue amiodarone, metoprolol   Hx Dementia P: Minimize sedating medications  Promote sleep / wake  Suspicion of OSA P: Consider outpatient sleep evaluation   SUMMARY OF TODAY'S PLAN:  Follow in ICU with addition of metoprolol.  To [facility] as of 10/28 am.    Best Practice / Goals of Care / Disposition.   DVT PROPHYLAXIS: Heparin SQ SUP: PPI NUTRITION: Heart healthy diet MOBILITY: Up with assistance GOALS OF CARE: Full code FAMILY DISCUSSIONS: Patient and wife updated at bedside 10/27  DISPOSITION ICU  LABS  Glucose No results for input(s): GLUCAP in the last 168 hours.  BMET Recent Labs  Lab 08/07/18 0512 08/07/18 1411 08/08/18 0012  NA 134* 138 138  K 3.6 3.2* 4.0  CL 100 107 107  CO2 26 25 23   BUN 16 12 14   CREATININE 1.30* 1.16 1.13  GLUCOSE 164* 102* 121*    Liver Enzymes Recent Labs  Lab 08/07/18 0512 08/07/18 1411 08/08/18  0012  AST 21 20 20   ALT 15 13 12   ALKPHOS 85 85 82  BILITOT 1.7* 1.8* 1.5*  ALBUMIN 3.3* 3.0* 2.9*    Electrolytes Recent Labs  Lab 08/07/18 0512 08/07/18 1411 08/08/18 0012  CALCIUM 8.8* 8.2* 8.4*  MG 2.2 2.2  --   PHOS 3.6 3.3  --     CBC Recent Labs  Lab 08/07/18 1411 08/08/18 0012 08/09/18 0224  WBC 8.3 9.0 5.3  HGB 12.5* 12.6* 11.5*  HCT 36.0* 36.1* 33.4*  PLT 147* 150 114*    ABG No results for input(s): PHART, PCO2ART, PO2ART in the last 168 hours.  Coag's Recent Labs  Lab 08/07/18 0512 08/07/18 1411  APTT  --  34  INR 1.13 1.25     Sepsis Markers Recent Labs  Lab 08/07/18 0925 08/07/18 0933 08/07/18 1411 08/07/18 1816 08/08/18 0012 08/09/18 0224  LATICACIDVEN 1.7 1.54 1.4  --   --   --   PROCALCITON  --   --   --  <0.10 <0.10 <0.10    Cardiac Enzymes Recent Labs  Lab 08/07/18 1411 08/07/18 1816 08/08/18 0012  TROPONINI 0.03* <0.03 0.03*      [name], NP-C [facility] Pgr: [phone] or if no answer [phone] 08/09/2018, 8:09 AM   Attending: Pt seen and examined and labs reviewed/ flowsheet updated and note edited where needed  Wife at bedside updated re plan to tx to [facility] 10/28 with f/u by pccm service as needed   Christinia Gully, MD Pulmonary and [place] [phone] After 5:30 PM or weekends, use [facility] [phone]

## 2018-08-10 ENCOUNTER — Other Ambulatory Visit: Payer: Self-pay

## 2018-08-10 LAB — BASIC METABOLIC PANEL
ANION GAP: 8 (ref 5–15)
BUN: 15 mg/dL (ref 8–23)
CALCIUM: 8.4 mg/dL — AB (ref 8.9–10.3)
CO2: 22 mmol/L (ref 22–32)
Chloride: 104 mmol/L (ref 98–111)
Creatinine, Ser: 0.95 mg/dL (ref 0.61–1.24)
Glucose, Bld: 162 mg/dL — ABNORMAL HIGH (ref 70–99)
Potassium: 3.7 mmol/L (ref 3.5–5.1)
Sodium: 134 mmol/L — ABNORMAL LOW (ref 135–145)

## 2018-08-10 MED ORDER — INSULIN REGULAR(HUMAN) IN NACL 100-0.9 UT/100ML-% IV SOLN
INTRAVENOUS | Status: DC
  Filled 2018-08-10: qty 100

## 2018-08-10 MED ORDER — POTASSIUM CHLORIDE 2 MEQ/ML IV SOLN
80.0000 meq | INTRAVENOUS | Status: DC
  Filled 2018-08-10: qty 40

## 2018-08-10 MED ORDER — SODIUM CHLORIDE 0.9 % IV SOLN
INTRAVENOUS | Status: DC
  Filled 2018-08-10: qty 30

## 2018-08-10 MED ORDER — MAGNESIUM SULFATE 50 % IJ SOLN
40.0000 meq | INTRAMUSCULAR | Status: DC
  Filled 2018-08-10: qty 9.85

## 2018-08-10 MED ORDER — DOPAMINE-DEXTROSE 3.2-5 MG/ML-% IV SOLN
0.0000 ug/kg/min | INTRAVENOUS | Status: DC
  Filled 2018-08-10: qty 250

## 2018-08-10 MED ORDER — FUROSEMIDE 20 MG PO TABS
20.0000 mg | ORAL_TABLET | Freq: Every day | ORAL | Status: DC
  Administered 2018-08-11 – 2018-08-13 (×3): 20 mg via ORAL
  Filled 2018-08-10 (×3): qty 1

## 2018-08-10 MED ORDER — NOREPINEPHRINE 4 MG/250ML-% IV SOLN
0.0000 ug/min | INTRAVENOUS | Status: DC
  Filled 2018-08-10: qty 250

## 2018-08-10 MED ORDER — PHENYLEPHRINE HCL-NACL 20-0.9 MG/250ML-% IV SOLN
30.0000 ug/min | INTRAVENOUS | Status: DC
  Filled 2018-08-10: qty 250

## 2018-08-10 MED ORDER — EPINEPHRINE PF 1 MG/ML IJ SOLN
0.0000 ug/min | INTRAVENOUS | Status: DC
  Filled 2018-08-10: qty 4

## 2018-08-10 MED ORDER — NITROGLYCERIN IN D5W 200-5 MCG/ML-% IV SOLN
2.0000 ug/min | INTRAVENOUS | Status: DC
  Filled 2018-08-10: qty 250

## 2018-08-10 MED ORDER — DEXMEDETOMIDINE HCL IN NACL 400 MCG/100ML IV SOLN
0.1000 ug/kg/h | INTRAVENOUS | Status: DC
  Filled 2018-08-10: qty 100

## 2018-08-10 MED ORDER — VANCOMYCIN HCL 10 G IV SOLR
1500.0000 mg | INTRAVENOUS | Status: DC
  Filled 2018-08-10: qty 1500

## 2018-08-10 MED ORDER — ACETAMINOPHEN 325 MG PO TABS
650.0000 mg | ORAL_TABLET | Freq: Four times a day (QID) | ORAL | Status: DC | PRN
  Administered 2018-08-12: 650 mg via ORAL
  Filled 2018-08-10: qty 2

## 2018-08-10 MED ORDER — SODIUM CHLORIDE 0.9 % IV SOLN
1.5000 g | INTRAVENOUS | Status: DC
  Filled 2018-08-10: qty 1.5

## 2018-08-10 NOTE — Progress Notes (Signed)
William [name] arrived to 7T06 at 1912.  Pt is alert, vitals taken, family and caregiver at bedside.

## 2018-08-10 NOTE — Consult Note (Signed)
   [facility] CM Inpatient Consult   55/00/1642  [place] [birth year] [id number]  Notified of patient's hospitalization by [facility] nurse from [facility] follow up call.  Patient has been engaged by [facility] Telephonic Nurse.  Will follow for progression and needs.  Natividad Brood, RN BSN [facility] Liaison  [phone] business mobile phone Toll free office [phone]

## 2018-08-10 NOTE — Progress Notes (Signed)
Patient had a ten beat brady in the 40 this morning. Night nurse stated that this is the first time it is happening. Will notify MD. Will continue to monitor patient.

## 2018-08-10 NOTE — Patient Outreach (Signed)
[facility]) Care Management  07/12/5746  Varnell Orvis Laboy [birth year] [id number]   EMMI- Congestive Heart Failure RED ON EMMI ALERT Day # 31 Date: 08/07/18 Red Alert Reason:   New/worsening problems? Yes New swelling? Yes  Patient noted to be admitted.  Notification sent to hospital liaison for follow up.    Plan: RN CM will sign off case.    Jone Baseman, RN, MSN [facility] Care Management Care Management Coordinator Direct Line [phone] Toll Free: [phone]  Fax: [phone]

## 2018-08-10 NOTE — Progress Notes (Signed)
[place] TEAM 1 - Stepdown/ICU JABARIE POP Zajicek  CNO:[id number] DOB: [birth year] DOA: 08/07/2018 PCP: Haywood Pao, MD    Brief Narrative:  84yo M w/ a hx of severe AoS scheduled for TAVR, CHF w/ EF 20%, VT s/p AICD, prostate CA, and frequent UTI's who was admitted 10/25 with sepsis from a presumed urinary tract infection.  Significant Events: 10/25 admit  10/27 off vasopressors 10/28 TR[facility] assume care   Subjective: Resting comfortably in bed. Denies cp, sob, n/v, or abdom pain. Having some ongoing pain in his R wrist.   Assessment & Plan:  Septic Shock due to Klebsiella UTI Shock/sepsis physiology resolved - cont abx coverage - transition to oral soon   Severe AoS Awaiting TAVR - TAVR Team actively following - tentative plan is for surgery next Tuesday   AAA Followed by TCTS  Chronic Systolic CHF Appears euvolemic at this time - follow Is/Os and daily weights   Hx VT s/p AICD   Dementia Appears to be mild - not agitated  Right wrist closed distal radial ulnar joint dorsal dislocation of the ulna Developed acute pain in joint 10/27 after forcibly trying to remove his wrist band - Ortho has evaluated - felt to be an acute exac of a chronic problem - surgical intervention not indicated at this time   Suspicion of OSA Consider outpatient sleep evaluation per PCCM    DVT prophylaxis: SQ heparin  Code Status: FULL CODE Family Communication: spoke w/ wife and daughter at bedside  Disposition Plan: stable for transfer - PT/OT   Consultants:  PCCM Ortho   Antimicrobials:  Rocephin 10/25 > Zithromax 10/25 > 10/26 Cefepime 10/25 > 10/26  Objective: Blood pressure 99/63, pulse 93, temperature 97.9 F (36.6 C), temperature source Oral, resp. rate (!) 24, height 5\' 11"  (1.803 m), weight 102.6 kg, SpO2 98 %.  Intake/Output Summary (Last 24 hours) at 08/10/2018 1510 Last data filed at 08/10/2018 1200 Gross per 24 hour  Intake 402.32 ml  Output 650 ml    Net -247.68 ml   Filed Weights   08/08/18 1300 08/09/18 0500 08/10/18 0500  Weight: 102.2 kg 101.8 kg 102.6 kg    Examination: General: No acute respiratory distress Lungs: Clear to auscultation bilaterally without wheezes or crackles Cardiovascular: distant HS - no rub - RRR  Abdomen: Nontender, nondistended, soft, bowel sounds positive, no rebound, no ascites, no appreciable mass Extremities: No significant cyanosis, clubbing, or edema bilateral lower extremities  CBC: Recent Labs  Lab 08/07/18 0512 08/07/18 1411 08/08/18 0012 08/09/18 0224  WBC 8.4 8.3 9.0 5.3  NEUTROABS 7.0  --  6.2 3.5  HGB 13.4 12.5* 12.6* 11.5*  HCT 38.4* 36.0* 36.1* 33.4*  MCV 95.8 95.7 96.5 95.4  PLT 137* 147* 150 294*   Basic Metabolic Panel: Recent Labs  Lab 08/07/18 0512 08/07/18 1411 08/08/18 0012 08/10/18 0709  NA 134* 138 138 134*  K 3.6 3.2* 4.0 3.7  CL 100 107 107 104  CO2 26 25 23 22   GLUCOSE 164* 102* 121* 162*  BUN 16 12 14 15   CREATININE 1.30* 1.16 1.13 0.95  CALCIUM 8.8* 8.2* 8.4* 8.4*  MG 2.2 2.2  --   --   PHOS 3.6 3.3  --   --    GFR: Estimated Creatinine Clearance: 65.5 mL/min (by C-G formula based on SCr of 0.95 mg/dL).  Liver Function Tests: Recent Labs  Lab 08/07/18 0512 08/07/18 1411 08/08/18 0012  AST 21 20 20   ALT 15 13  12  ALKPHOS 85 85 82  BILITOT 1.7* 1.8* 1.5*  PROT 6.3* 6.0* 6.1*  ALBUMIN 3.3* 3.0* 2.9*   Recent Labs  Lab 08/07/18 1411  LIPASE 26  AMYLASE 19*    Coagulation Profile: Recent Labs  Lab 08/07/18 0512 08/07/18 1411  INR 1.13 1.25    Cardiac Enzymes: Recent Labs  Lab 08/07/18 1411 08/07/18 1816 08/08/18 0012  TROPONINI 0.03* <0.03 0.03*     Recent Results (from the past 240 hour(s))  Culture, blood (Routine x 2)     Status: None (Preliminary result)   Collection Time: 08/07/18  5:00 AM  Result Value Ref Range Status   Specimen Description BLOOD LEFT ARM  Final   Special Requests   Final    BOTTLES DRAWN  AEROBIC AND ANAEROBIC Blood Culture results may not be optimal due to an excessive volume of blood received in culture bottles   Culture   Final    NO GROWTH 3 DAYS Performed at [facility] [address], [place], [place] [address]    Report Status PENDING  Incomplete  Culture, blood (Routine x 2)     Status: None (Preliminary result)   Collection Time: 08/07/18  5:15 AM  Result Value Ref Range Status   Specimen Description BLOOD RIGHT HAND  Final   Special Requests   Final    BOTTLES DRAWN AEROBIC ONLY Blood Culture adequate volume   Culture   Final    NO GROWTH 3 DAYS Performed at [facility], [place] [address] [place], [place] [address]    Report Status PENDING  Incomplete  Urine culture     Status: Abnormal   Collection Time: 08/07/18  5:38 AM  Result Value Ref Range Status   Specimen Description URINE, RANDOM  Final   Special Requests   Final    NONE Performed at [facility], [place] [address], [place], [place] [address]    Culture >=100,000 COLONIES/mL KLEBSIELLA PNEUMONIAE (A)  Final   Report Status 08/09/2018 FINAL  Final   Organism ID, Bacteria KLEBSIELLA PNEUMONIAE (A)  Final      Susceptibility   Klebsiella pneumoniae - MIC*    AMPICILLIN >=32 RESISTANT Resistant     CEFAZOLIN <=4 SENSITIVE Sensitive     CEFTRIAXONE <=1 SENSITIVE Sensitive     CIPROFLOXACIN <=0.25 SENSITIVE Sensitive     GENTAMICIN <=1 SENSITIVE Sensitive     IMIPENEM <=0.25 SENSITIVE Sensitive     NITROFURANTOIN <=16 SENSITIVE Sensitive     TRIMETH/SULFA <=20 SENSITIVE Sensitive     AMPICILLIN/SULBACTAM 4 SENSITIVE Sensitive     PIP/TAZO <=4 SENSITIVE Sensitive     Extended ESBL NEGATIVE Sensitive     * >=100,000 COLONIES/mL KLEBSIELLA PNEUMONIAE  Culture, blood (routine x 2)     Status: None (Preliminary result)   Collection Time: 08/07/18  2:11 PM  Result Value Ref Range Status   Specimen Description BLOOD LEFT ANTECUBITAL  Final   Special Requests   Final    BOTTLES  DRAWN AEROBIC AND ANAEROBIC Blood Culture adequate volume   Culture   Final    NO GROWTH 3 DAYS Performed at [facility], [address], [place],  [address]    Report Status PENDING  Incomplete  Culture, blood (routine x 2)     Status: None (Preliminary result)   Collection Time: 08/07/18  2:11 PM  Result Value Ref Range Status   Specimen Description BLOOD BLOOD LEFT HAND  Final  Special Requests   Final    BOTTLES DRAWN AEROBIC AND ANAEROBIC Blood Culture adequate volume   Culture   Final    NO GROWTH 3 DAYS Performed at [facility], [place] [address], [place], [place] [address]    Report Status PENDING  Incomplete  MRSA PCR Screening     Status: None   Collection Time: 08/07/18  2:11 PM  Result Value Ref Range Status   MRSA by PCR NEGATIVE NEGATIVE Final    Comment:        The GeneXpert MRSA Assay (FDA approved for NASAL specimens only), is one component of a comprehensive MRSA colonization surveillance program. It is not intended to diagnose MRSA infection nor to guide or monitor treatment for MRSA infections. Performed at [facility], [place] [address], [place], [place] [address]      Scheduled Meds: . amiodarone  100 mg Oral Daily  . aspirin  81 mg Oral Daily  . cholecalciferol  2,000 Units Oral QHS  . ezetimibe-simvastatin  1 tablet Oral QHS  . heparin  5,000 Units Subcutaneous Q8H  . [START ON 08/11/2018] insulin   Intravenous To OR  . [START ON 08/11/2018] magnesium sulfate  40 mEq Other To OR  . metoprolol succinate  50 mg Oral Daily  . pantoprazole  40 mg Oral Daily  . [START ON 08/11/2018] phenylephrine  30-200 mcg/min Intravenous To OR  . polyethylene glycol  17 g Oral QPM  . potassium chloride  10 mEq Oral Daily  . [START ON 08/11/2018] potassium chloride  80 mEq Other To OR  . sulfacetamide  1 drop Right Eye QID     LOS: 3 days   Cherene Altes, MD [facility] Office  [phone] Pager - Text Page per  Amion  If 7PM-7AM, please contact night-coverage per Amion 08/10/2018, 3:10 PM

## 2018-08-11 ENCOUNTER — Inpatient Hospital Stay (HOSPITAL_COMMUNITY): Admission: RE | Admit: 2018-08-11 | Payer: Medicare Other | Source: Ambulatory Visit | Admitting: Cardiovascular Disease

## 2018-08-11 LAB — CBC
HEMATOCRIT: 33.7 % — AB (ref 39.0–52.0)
HEMOGLOBIN: 11.7 g/dL — AB (ref 13.0–17.0)
MCH: 33.2 pg (ref 26.0–34.0)
MCHC: 34.7 g/dL (ref 30.0–36.0)
MCV: 95.7 fL (ref 80.0–100.0)
Platelets: 143 10*3/uL — ABNORMAL LOW (ref 150–400)
RBC: 3.52 MIL/uL — AB (ref 4.22–5.81)
RDW: 15.4 % (ref 11.5–15.5)
WBC: 6.7 10*3/uL (ref 4.0–10.5)
nRBC: 0 % (ref 0.0–0.2)

## 2018-08-11 LAB — COMPREHENSIVE METABOLIC PANEL
ALT: 16 U/L (ref 0–44)
AST: 21 U/L (ref 15–41)
Albumin: 2.5 g/dL — ABNORMAL LOW (ref 3.5–5.0)
Alkaline Phosphatase: 64 U/L (ref 38–126)
Anion gap: 7 (ref 5–15)
BUN: 17 mg/dL (ref 8–23)
CHLORIDE: 102 mmol/L (ref 98–111)
CO2: 25 mmol/L (ref 22–32)
Calcium: 8.4 mg/dL — ABNORMAL LOW (ref 8.9–10.3)
Creatinine, Ser: 1.04 mg/dL (ref 0.61–1.24)
GFR calc Af Amer: >60 mL/min (ref >60)
GFR calc non Af Amer: >60 mL/min (ref >60)
GLUCOSE: 144 mg/dL — AB (ref 70–99)
POTASSIUM: 3.4 mmol/L — AB (ref 3.5–5.1)
Sodium: 134 mmol/L — ABNORMAL LOW (ref 135–145)
Total Bilirubin: 1.4 mg/dL — ABNORMAL HIGH (ref 0.3–1.2)
Total Protein: 5.9 g/dL — ABNORMAL LOW (ref 6.5–8.1)

## 2018-08-11 LAB — MAGNESIUM: MAGNESIUM: 2.2 mg/dL (ref 1.7–2.4)

## 2018-08-11 MED ORDER — POTASSIUM CHLORIDE CRYS ER 20 MEQ PO TBCR
40.0000 meq | EXTENDED_RELEASE_TABLET | Freq: Once | ORAL | Status: AC
  Administered 2018-08-11: 40 meq via ORAL
  Filled 2018-08-11: qty 2

## 2018-08-11 MED ORDER — CEFDINIR 300 MG PO CAPS
300.0000 mg | ORAL_CAPSULE | Freq: Two times a day (BID) | ORAL | Status: AC
  Administered 2018-08-12 (×2): 300 mg via ORAL
  Filled 2018-08-11 (×2): qty 1

## 2018-08-11 NOTE — Evaluation (Signed)
Occupational Therapy Evaluation Patient Details Name: William [name] MRN: [id number] DOB: 1928-01-01 Today's Date: 08/11/2018    History of Present Illness 84yo M w/ a hx of severe AoS scheduled for TAVR, CHF w/ EF 20%, VT s/p AICD, prostate CA, and frequent UTI's who was admitted 10/25 with sepsis from a presumed urinary tract infection.   Clinical Impression   This 84 y/o male presents with the above. Family assist with providing PLOF this session. At baseline pt is able to complete ADLs and functional mobility with mod independence using RW. Pt currently presenting with significant weakness, decreased sitting/standing balance, decreased use of RUE. Pt requiring mod-maxA+2 for safe functional transfers using RW this session, currently requires modA for UB ADL, maxA for LB ADLs. Pt's family present and supportive throughout session. Pt will benefit from continued acute OT services and recommend follow up therapy services in SNF setting after discharge to maximize his overall strength, safety, and independence with ADLs and mobility prior to return home. Will follow.     Follow Up Recommendations  SNF;Supervision/Assistance - 24 hour    Equipment Recommendations  3 in 1 bedside commode;Other (comment)(to be further assessed)           Precautions / Restrictions Precautions Precautions: Fall Required Braces or Orthoses: Other Brace/Splint Other Brace/Splint: pt with R wrist splint Restrictions Weight Bearing Restrictions: No Other Position/Activity Restrictions: per ortho note pt okay to WBAT through RUE; if painful may trial platform for RW      Mobility Bed Mobility Overal bed mobility: Needs Assistance Bed Mobility: Supine to Sit     Supine to sit: Mod assist;+2 for safety/equipment     General bed mobility comments: assis to elevate trunk and for RLE management over EOB; assist to scoot to EOB  Transfers Overall transfer level: Needs assistance Equipment used:  Rolling walker (2 wheeled) Transfers: Sit to/from [facility] Sit to Stand: Mod assist;Max assist;+2 physical assistance;+2 safety/equipment(x3 trials) Stand pivot transfers: Max assist;+2 physical assistance;+2 safety/equipment       General transfer comment: pt stood x3 from EOB; requires boosting and steadying assist; VCs for safety, multimodal cues for hand placement and upright posture; pt intiailly able to take few steps with overall modA+2, requires assist to sequence RW use with steps, as pt fatigues (fatigues quickly) pt requiring maxA+2 to fully turn and transition to sitting in recliner    Balance Overall balance assessment: Needs assistance Sitting-balance support: Feet supported;Single extremity supported;Bilateral upper extremity supported Sitting balance-Leahy Scale: Poor Sitting balance - Comments: initially able to maintain static sitting with minguard assist, as pt fatigues requires increased assist due to posterior lean Postural control: Posterior lean Standing balance support: Bilateral upper extremity supported Standing balance-Leahy Scale: Poor                             ADL either performed or assessed with clinical judgement   ADL Overall ADL's : Needs assistance/impaired Eating/Feeding: Set up;Sitting   Grooming: Sitting;Min guard;Minimal assistance   Upper Body Bathing: Minimal assistance;Sitting   Lower Body Bathing: Maximal assistance;+2 for physical assistance;+2 for safety/equipment;Sit to/from stand   Upper Body Dressing : Sitting;Minimal assistance   Lower Body Dressing: Maximal assistance;+2 for physical assistance;+2 for safety/equipment;Sit to/from stand Lower Body Dressing Details (indicate cue type and reason): pt requires assist to don socks and brief this session; mod-maxA(+2) for standing balance Toilet Transfer: Moderate assistance;Maximal assistance;+2 for physical assistance;+2 for  safety/equipment;Stand-pivot;RW Toilet Transfer  Details (indicate cue type and reason): simulated in transfer to [place] and Hygiene: Maximal assistance;+2 for physical assistance;+2 for safety/equipment;Sit to/from stand       Functional mobility during ADLs: Moderate assistance;Maximal assistance;+2 for physical assistance;+2 for safety/equipment;Rolling walker General ADL Comments: increased assist required as pt fatigues quickly     Vision         Perception     Praxis      Pertinent Vitals/Pain Pain Assessment: Faces Faces Pain Scale: Hurts little more Pain Location: RUE Pain Descriptors / Indicators: Discomfort;Guarding Pain Intervention(s): Limited activity within patient's tolerance     Hand Dominance Right   Extremity/Trunk Assessment Upper Extremity Assessment Upper Extremity Assessment: Defer to OT evaluation RUE Deficits / Details: requires active assist to move RUE through full ROM due to weakness/RUE pain; pt able to flex/extend digits given increased effort; decreased grip strength RUE: Unable to fully assess due to pain RUE Coordination: decreased fine motor;decreased gross motor   Lower Extremity Assessment Lower Extremity Assessment: Generalized weakness;RLE deficits/detail;LLE deficits/detail RLE Coordination: decreased fine motor LLE Coordination: decreased fine motor       Communication Communication Communication: HOH   Cognition Arousal/Alertness: Awake/alert Behavior During Therapy: WFL for tasks assessed/performed Overall Cognitive Status: History of cognitive impairments - at baseline                                 General Comments: pt with hx of dementia at baseline; requires increased time for processing/following one step commands, suspect partly due to pt is [facility]   General Comments       Exercises     Shoulder Instructions      Home Living Family/patient expects to be discharged  to:: Private residence Living Arrangements: Spouse/significant other Available Help at Discharge: Family;Friend(s);Available 24 hours/day Type of Home: House Home Access: Stairs to enter Entrance Stairs-Number of Steps: 1(threshold)   Home Layout: Two level;Able to live on main level with bedroom/bathroom(new master suite on first level)     Bathroom Shower/Tub: Tub/shower unit;Walk-in shower(walk-in tub)   Bathroom Toilet: Handicapped height     Home Equipment: Environmental consultant - 2 wheels;Wheelchair - manual;Hand held shower head;Shower seat;Grab bars - toilet;Grab bars - tub/shower   Additional Comments: pt's family assisting with providing PLOF      Prior Functioning/Environment Level of Independence: Independent with assistive device(s);Needs assistance  Gait / Transfers Assistance Needed: supervision with use of RW indoors/outdoors; use of w/c for long distances ADL's / Homemaking Assistance Needed: mod independent   Comments: using RW for mobility; uses urinal for voiding bladder at night        OT Problem List: Decreased strength;Decreased range of motion;Decreased activity tolerance;Impaired balance (sitting and/or standing);Decreased knowledge of use of DME or AE;Impaired UE functional use      OT Treatment/Interventions: Self-care/ADL training;Therapeutic exercise;DME and/or AE instruction;Therapeutic activities;Patient/family education;Balance training    OT Goals(Current goals can be found in the care plan section) Acute Rehab OT Goals Patient Stated Goal: regain his strength OT Goal Formulation: With patient Time For Goal Achievement: 08/25/18 Potential to Achieve Goals: Good  OT Frequency: Min 2X/week   Barriers to D/C:            Co-evaluation PT/OT/SLP Co-Evaluation/Treatment: Yes Reason for Co-Treatment: Complexity of the patient's impairments (multi-system involvement) PT goals addressed during session: Mobility/safety with mobility OT goals addressed during  session: ADL's and self-care      AM-PAC  PT "6 Clicks" Daily Activity     Outcome Measure Help from another person eating meals?: None Help from another person taking care of personal grooming?: A Little Help from another person toileting, which includes using toliet, bedpan, or urinal?: A Lot Help from another person bathing (including washing, rinsing, drying)?: A Lot Help from another person to put on and taking off regular upper body clothing?: A Little Help from another person to put on and taking off regular lower body clothing?: A Lot 6 Click Score: 16   End of Session Equipment Utilized During Treatment: Gait belt;Rolling walker Nurse Communication: Mobility status  Activity Tolerance: Patient tolerated treatment well Patient left: in chair;with call bell/phone within reach;with chair alarm set;with family/visitor present;with nursing/sitter in room  OT Visit Diagnosis: Muscle weakness (generalized) (M62.81);Unsteadiness on feet (R26.81)                Time: 4656-8127 OT Time Calculation (min): 42 min Charges:  OT General Charges $OT Visit: 1 Visit OT Evaluation $OT Eval Moderate Complexity: [place], OT [employer] Pager [phone] Office [phone]   Raymondo Band 08/11/2018, 1:24 PM

## 2018-08-11 NOTE — Progress Notes (Signed)
[place] TEAM 1 - Stepdown/ICU William [name] William [name]  BSJ:[id number] DOB: [birth year] DOA: 08/07/2018 PCP: Haywood Pao, MD    Brief Narrative:  84yo M w/ a hx of severe AoS scheduled for TAVR, CHF w/ EF 20%, VT s/p AICD, prostate CA, and frequent UTI's who was admitted 10/25 with sepsis from a presumed urinary tract infection.  Significant Events: 10/25 admit  10/27 off vasopressors 10/28 TR[facility] assume care   Subjective: Pt is working w/ PT at the time of my visit. He denies new complaints, and appears to be making good progress medically. He is not experiencing sob, cp, n/v, or abdom pain.   Assessment & Plan:  Septic Shock due to Klebsiella UTI Shock/sepsis physiology resolved - cont abx coverage - transition to oral soon after last IV dose today   Severe AoS Awaiting TAVR - TAVR Team actively following - tentative plan is for surgery next Tuesday   Mild hypokalemia Replace and follow - Mg is normal   AAA Followed by TCTS  Chronic Systolic CHF Appears euvolemic at this time - follow Is/Os and daily weights - currently on home lasix dose   Filed Weights   08/10/18 0500 08/10/18 1912 08/11/18 0425  Weight: 102.6 kg 105.1 kg 103.7 kg     Hx VT s/p AICD   Dementia Appears to be mild - not agitated  Right wrist closed distal radial ulnar joint dorsal dislocation of the ulna Developed acute pain in joint 10/27 after forcibly trying to remove his wrist band - Ortho has evaluated - felt to be an acute exac of a chronic problem - surgical intervention not indicated at this time - has been able to use the R hand to push up today   Suspicion of OSA Consider outpatient sleep evaluation per PCCM    DVT prophylaxis: SQ heparin  Code Status: FULL CODE Family Communication: spoke w/ wife and daughter at bedside  Disposition Plan: stable for transfer to tele bed - PT/OT - anticipate d/c ~Thurs/Friday  Consultants:  PCCM Ortho   Antimicrobials:  Rocephin 10/25  > Zithromax 10/25 > 10/26 Cefepime 10/25 > 10/26  Objective: Blood pressure 100/71, pulse 98, temperature 97.7 F (36.5 C), temperature source Oral, resp. rate 17, height 5\' 10"  (1.778 m), weight 103.7 kg, SpO2 97 %.  Intake/Output Summary (Last 24 hours) at 08/11/2018 1139 Last data filed at 08/11/2018 0428 Gross per 24 hour  Intake 252.32 ml  Output 400 ml  Net -147.68 ml   Filed Weights   08/10/18 0500 08/10/18 1912 08/11/18 0425  Weight: 102.6 kg 105.1 kg 103.7 kg    Examination: General: No acute respiratory distress while working w/ PT Lungs: Clear to auscultation bilaterally  Cardiovascular: distant HS - no rub - RRR  Abdomen: NT/ND, soft, BS+ Extremities: No signif edema B LE   CBC: Recent Labs  Lab 08/07/18 0512  08/08/18 0012 08/09/18 0224 08/11/18 0524  WBC 8.4   < > 9.0 5.3 6.7  NEUTROABS 7.0  --  6.2 3.5  --   HGB 13.4   < > 12.6* 11.5* 11.7*  HCT 38.4*   < > 36.1* 33.4* 33.7*  MCV 95.8   < > 96.5 95.4 95.7  PLT 137*   < > 150 114* 143*   < > = values in this interval not displayed.   Basic Metabolic Panel: Recent Labs  Lab 08/07/18 0512 08/07/18 1411 08/08/18 0012 08/10/18 0709 08/11/18 0524  NA 134* 138 138 134* 134*  K 3.6 3.2* 4.0 3.7 3.4*  CL 100 107 107 104 102  CO2 26 25 23 22 25   GLUCOSE 164* 102* 121* 162* 144*  BUN 16 12 14 15 17   CREATININE 1.30* 1.16 1.13 0.95 1.04  CALCIUM 8.8* 8.2* 8.4* 8.4* 8.4*  MG 2.2 2.2  --   --  2.2  PHOS 3.6 3.3  --   --   --    GFR: Estimated Creatinine Clearance: 59.2 mL/min (by C-G formula based on SCr of 1.04 mg/dL).  Liver Function Tests: Recent Labs  Lab 08/07/18 0512 08/07/18 1411 08/08/18 0012 08/11/18 0524  AST 21 20 20 21   ALT 15 13 12 16   ALKPHOS 85 85 82 64  BILITOT 1.7* 1.8* 1.5* 1.4*  PROT 6.3* 6.0* 6.1* 5.9*  ALBUMIN 3.3* 3.0* 2.9* 2.5*   Recent Labs  Lab 08/07/18 1411  LIPASE 26  AMYLASE 19*    Coagulation Profile: Recent Labs  Lab 08/07/18 0512 08/07/18 1411   INR 1.13 1.25    Cardiac Enzymes: Recent Labs  Lab 08/07/18 1411 08/07/18 1816 08/08/18 0012  TROPONINI 0.03* <0.03 0.03*     Recent Results (from the past 240 hour(s))  Culture, blood (Routine x 2)     Status: None (Preliminary result)   Collection Time: 08/07/18  5:00 AM  Result Value Ref Range Status   Specimen Description BLOOD LEFT ARM  Final   Special Requests   Final    BOTTLES DRAWN AEROBIC AND ANAEROBIC Blood Culture results may not be optimal due to an excessive volume of blood received in culture bottles   Culture   Final    NO GROWTH 3 DAYS Performed at [facility], [place] [address] [place], [place] [address]    Report Status PENDING  Incomplete  Culture, blood (Routine x 2)     Status: None (Preliminary result)   Collection Time: 08/07/18  5:15 AM  Result Value Ref Range Status   Specimen Description BLOOD RIGHT HAND  Final   Special Requests   Final    BOTTLES DRAWN AEROBIC ONLY Blood Culture adequate volume   Culture   Final    NO GROWTH 3 DAYS Performed at [facility], [place] [address], [place], [place] [address]    Report Status PENDING  Incomplete  Urine culture     Status: Abnormal   Collection Time: 08/07/18  5:38 AM  Result Value Ref Range Status   Specimen Description URINE, RANDOM  Final   Special Requests   Final    NONE Performed at [facility], [place] [address], [place], [place] [address]    Culture >=100,000 COLONIES/mL KLEBSIELLA PNEUMONIAE (A)  Final   Report Status 08/09/2018 FINAL  Final   Organism ID, Bacteria KLEBSIELLA PNEUMONIAE (A)  Final      Susceptibility   Klebsiella pneumoniae - MIC*    AMPICILLIN >=32 RESISTANT Resistant     CEFAZOLIN <=4 SENSITIVE Sensitive     CEFTRIAXONE <=1 SENSITIVE Sensitive     CIPROFLOXACIN <=0.25 SENSITIVE Sensitive     GENTAMICIN <=1 SENSITIVE Sensitive     IMIPENEM <=0.25 SENSITIVE Sensitive     NITROFURANTOIN <=16 SENSITIVE Sensitive     TRIMETH/SULFA <=20 SENSITIVE  Sensitive     AMPICILLIN/SULBACTAM 4 SENSITIVE Sensitive     PIP/TAZO <=4 SENSITIVE Sensitive     Extended ESBL NEGATIVE Sensitive     * >=100,000 COLONIES/mL KLEBSIELLA PNEUMONIAE  Culture, blood (routine x 2)  Status: None (Preliminary result)   Collection Time: 08/07/18  2:11 PM  Result Value Ref Range Status   Specimen Description BLOOD LEFT ANTECUBITAL  Final   Special Requests   Final    BOTTLES DRAWN AEROBIC AND ANAEROBIC Blood Culture adequate volume   Culture   Final    NO GROWTH 3 DAYS Performed at [facility], [address], [place], [place] [address]    Report Status PENDING  Incomplete  Culture, blood (routine x 2)     Status: None (Preliminary result)   Collection Time: 08/07/18  2:11 PM  Result Value Ref Range Status   Specimen Description BLOOD BLOOD LEFT HAND  Final   Special Requests   Final    BOTTLES DRAWN AEROBIC AND ANAEROBIC Blood Culture adequate volume   Culture   Final    NO GROWTH 3 DAYS Performed at [facility], [place] [address], [place], [place] [address]    Report Status PENDING  Incomplete  MRSA PCR Screening     Status: None   Collection Time: 08/07/18  2:11 PM  Result Value Ref Range Status   MRSA by PCR NEGATIVE NEGATIVE Final    Comment:        The GeneXpert MRSA Assay (FDA approved for NASAL specimens only), is one component of a comprehensive MRSA colonization surveillance program. It is not intended to diagnose MRSA infection nor to guide or monitor treatment for MRSA infections. Performed at [facility], [place] [address], [place], [place] [address]      Scheduled Meds: . amiodarone  100 mg Oral Daily  . aspirin  81 mg Oral Daily  . cholecalciferol  2,000 Units Oral QHS  . ezetimibe-simvastatin  1 tablet Oral QHS  . furosemide  20 mg Oral Daily  . heparin  5,000 Units Subcutaneous Q8H  . metoprolol succinate  50 mg Oral Daily  . pantoprazole  40 mg Oral Daily  . polyethylene glycol  17 g Oral QPM  .  potassium chloride  10 mEq Oral Daily  . sulfacetamide  1 drop Right Eye QID     LOS: 4 days   Cherene Altes, MD [facility] Office  [phone] Pager - Text Page per Amion  If 7PM-7AM, please contact night-coverage per Amion 08/11/2018, 11:39 AM

## 2018-08-11 NOTE — Evaluation (Signed)
Physical Therapy Evaluation Patient Details Name: RICHARDO POPOFF MRN: [id number] DOB: 1928/06/17 Today's Date: 08/11/2018   History of Present Illness  84yo M w/ a hx of severe AoS scheduled for TAVR, CHF w/ EF 20%, VT s/p AICD, prostate CA, and frequent UTI's who was admitted 10/25 with sepsis from a presumed urinary tract infection.  Clinical Impression  Pt admitted with/for sepsis from UTI with resultant significant weakness.  Pt needing mod to max assist of 2 persons for basic mobility..  Pt currently limited functionally due to the problems listed. ( See problems list.)   Pt will benefit from PT to maximize function and safety in order to get ready for next venue listed below.     Follow Up Recommendations SNF;Supervision/Assistance - 24 hour    Equipment Recommendations  Other (comment)(TBA)    Recommendations for Other Services       Precautions / Restrictions Precautions Precautions: Fall Required Braces or Orthoses: Other Brace/Splint Other Brace/Splint: pt with R wrist splint Restrictions Weight Bearing Restrictions: No Other Position/Activity Restrictions: per ortho note pt okay to WBAT through RUE; if painful may trial platform for RW      Mobility  Bed Mobility Overal bed mobility: Needs Assistance Bed Mobility: Supine to Sit     Supine to sit: Mod assist;+2 for safety/equipment     General bed mobility comments: assis to elevate trunk and for RLE management over EOB; assist to scoot to EOB  Transfers Overall transfer level: Needs assistance Equipment used: Rolling walker (2 wheeled) Transfers: Sit to/from [facility] Sit to Stand: Mod assist;Max assist;+2 physical assistance;+2 safety/equipment(x3 trials) Stand pivot transfers: Max assist;+2 physical assistance;+2 safety/equipment       General transfer comment: pt stood x3 from EOB; requires boosting and steadying assist; VCs for safety, multimodal cues for hand placement and  upright posture; pt intiailly able to take few steps with overall modA+2, requires assist to sequence RW use with steps, as pt fatigues (fatigues quickly) pt requiring maxA+2 to fully turn and transition to sitting in recliner  Ambulation/Gait             General Gait Details: uncoordinated pivot transfer with RW only  Stairs            Wheelchair Mobility    Modified Rankin (Stroke Patients Only)       Balance Overall balance assessment: Needs assistance Sitting-balance support: Feet supported;Single extremity supported;Bilateral upper extremity supported Sitting balance-Leahy Scale: Poor Sitting balance - Comments: initially able to maintain static sitting with minguard assist, as pt fatigues requires increased assist due to posterior lean Postural control: Posterior lean Standing balance support: Bilateral upper extremity supported Standing balance-Leahy Scale: Poor                               Pertinent Vitals/Pain Pain Assessment: Faces Faces Pain Scale: Hurts little more Pain Location: RUE Pain Descriptors / Indicators: Discomfort;Guarding Pain Intervention(s): Limited activity within patient's tolerance    Home Living Family/patient expects to be discharged to:: Private residence Living Arrangements: Spouse/significant other Available Help at Discharge: Family;Friend(s);Available 24 hours/day Type of Home: House Home Access: Stairs to enter   Entrance Stairs-Number of Steps: 1(threshold) Home Layout: Two level;Able to live on main level with bedroom/bathroom(new master suite on first level) Home Equipment: Walker - 2 wheels;Wheelchair - manual;Hand held shower head;Shower seat;Grab bars - toilet;Grab bars - tub/shower Additional Comments: pt's family assisting with providing PLOF  Prior Function Level of Independence: Independent with assistive device(s);Needs assistance   Gait / Transfers Assistance Needed: supervision with use of RW  indoors/outdoors; use of w/c for long distances  ADL's / Homemaking Assistance Needed: mod independent  Comments: using RW for mobility; uses urinal for voiding bladder at night     Hand Dominance   Dominant Hand: Right    Extremity/Trunk Assessment   Upper Extremity Assessment Upper Extremity Assessment: Defer to OT evaluation RUE Deficits / Details: requires active assist to move RUE through full ROM due to weakness/RUE pain; pt able to flex/extend digits given increased effort; decreased grip strength RUE: Unable to fully assess due to pain RUE Coordination: decreased fine motor;decreased gross motor    Lower Extremity Assessment Lower Extremity Assessment: Generalized weakness;RLE deficits/detail;LLE deficits/detail RLE Coordination: decreased fine motor LLE Coordination: decreased fine motor       Communication   Communication: HOH  Cognition Arousal/Alertness: Awake/alert Behavior During Therapy: WFL for tasks assessed/performed Overall Cognitive Status: History of cognitive impairments - at baseline                                 General Comments: pt with hx of dementia at baseline; requires increased time for processing/following one step commands, suspect partly due to pt is [facility]      General Comments      Exercises     Assessment/Plan    PT Assessment Patient needs continued PT services  PT Problem List Decreased strength;Decreased activity tolerance;Decreased balance;Decreased mobility;Decreased coordination       PT Treatment Interventions Gait training;DME instruction;Functional mobility training;Therapeutic activities;Balance training;Patient/family education    PT Goals (Current goals can be found in the Care Plan section)  Acute Rehab PT Goals Patient Stated Goal: regain his strength PT Goal Formulation: With patient Time For Goal Achievement: 08/25/18 Potential to Achieve Goals: Good    Frequency Min 3X/week   Barriers to  discharge        Co-evaluation PT/OT/SLP Co-Evaluation/Treatment: Yes Reason for Co-Treatment: Complexity of the patient's impairments (multi-system involvement) PT goals addressed during session: Mobility/safety with mobility OT goals addressed during session: ADL's and self-care       AM-PAC PT "6 Clicks" Daily Activity  Outcome Measure Difficulty turning over in bed (including adjusting bedclothes, sheets and blankets)?: Unable Difficulty moving from lying on back to sitting on the side of the bed? : Unable Difficulty sitting down on and standing up from a chair with arms (e.g., wheelchair, bedside commode, etc,.)?: Unable Help needed moving to and from a bed to chair (including a wheelchair)?: A Lot Help needed walking in hospital room?: A Lot Help needed climbing 3-5 steps with a railing? : Total 6 Click Score: 8    End of Session   Activity Tolerance: Patient tolerated treatment well;Patient limited by lethargy Patient left: in chair;with call bell/phone within reach;with chair alarm set;with family/visitor present Nurse Communication: Mobility status PT Visit Diagnosis: Muscle weakness (generalized) (M62.81);Unsteadiness on feet (R26.81);Difficulty in walking, not elsewhere classified (R26.2)    Time: 6578-4696 PT Time Calculation (min) (ACUTE ONLY): 45 min   Charges:   PT Evaluation $PT Eval Moderate Complexity: 1 Mod PT Treatments $Therapeutic Activity: 8-22 mins        08/11/2018  Donnella Sham, PT Acute Rehabilitation Services [phone]  (pager) [phone]  (office)  Tessie Fass Lahari Suttles 08/11/2018, 1:30 PM

## 2018-08-12 LAB — CULTURE, BLOOD (ROUTINE X 2)
CULTURE: NO GROWTH
CULTURE: NO GROWTH
Culture: NO GROWTH
Culture: NO GROWTH
SPECIAL REQUESTS: ADEQUATE
Special Requests: ADEQUATE
Special Requests: ADEQUATE

## 2018-08-12 LAB — BASIC METABOLIC PANEL
Anion gap: 9 (ref 5–15)
BUN: 17 mg/dL (ref 8–23)
CALCIUM: 8.4 mg/dL — AB (ref 8.9–10.3)
CO2: 23 mmol/L (ref 22–32)
CREATININE: 0.93 mg/dL (ref 0.61–1.24)
Chloride: 104 mmol/L (ref 98–111)
Glucose, Bld: 134 mg/dL — ABNORMAL HIGH (ref 70–99)
Potassium: 3.9 mmol/L (ref 3.5–5.1)
SODIUM: 136 mmol/L (ref 135–145)

## 2018-08-12 NOTE — Progress Notes (Signed)
Physical Therapy Treatment Patient Details Name: William [name] MRN: [id number] DOB: 1928-02-11 Today's Date: 08/12/2018    History of Present Illness 84yo M w/ a hx of severe AoS scheduled for TAVR, CHF w/ EF 20%, VT s/p AICD, prostate CA, and frequent UTI's who was admitted 10/25 with sepsis from a presumed urinary tract infection.    PT Comments    Incrementally improved, but will still be difficult for pt's wife to handle him over the w/e until surgery next Tuesday.  Will see how transfers bed to w/c go 10/31.   Follow Up Recommendations  Home health PT;Supervision/Assistance - 24 hour;Other (comment)(family hopes for home until TAVR on Tues.)     Equipment Recommendations  Other (comment)(TBA)    Recommendations for Other Services       Precautions / Restrictions Precautions Precautions: Fall Required Braces or Orthoses: Other Brace/Splint Other Brace/Splint: pt with R wrist splint Restrictions Weight Bearing Restrictions: No Other Position/Activity Restrictions: per ortho note pt okay to WBAT through RUE; if painful may trial platform for RW    Mobility  Bed Mobility Overal bed mobility: Needs Assistance Bed Mobility: Supine to Sit     Supine to sit: Mod assist;+2 for safety/equipment     General bed mobility comments: up via L elbow.  Cues to walk legs to EOB and boost up onto left elbow.  Pt needed min assist to scoot to EOB pivoting on L UE.  Transfers Overall transfer level: Needs assistance Equipment used: Rolling walker (2 wheeled) Transfers: Sit to/from Stand Sit to Stand: Mod assist;+2 physical assistance         General transfer comment: cues for hand placement.  Assist to come forward.  Pt has his own way of standing that is not safe.  Ambulation/Gait Ambulation/Gait assistance: Mod assist;+2 safety/equipment Gait Distance (Feet): 5 Feet Assistive device: Rolling walker (2 wheeled) Gait Pattern/deviations: Step-to pattern;Decreased stride  length   Gait velocity interpretation: <1.31 ft/sec, indicative of household ambulator General Gait Details: very wide BOS stance and gait with flexed posture and much too far positioning behind the RW.  Pt consistently trying to walk outside the RW.   Stairs             Wheelchair Mobility    Modified Rankin (Stroke Patients Only)       Balance Overall balance assessment: Needs assistance Sitting-balance support: Feet supported;Single extremity supported;Bilateral upper extremity supported Sitting balance-Leahy Scale: Poor   Postural control: Posterior lean   Standing balance-Leahy Scale: Poor                              Cognition Arousal/Alertness: Awake/alert Behavior During Therapy: WFL for tasks assessed/performed Overall Cognitive Status: History of cognitive impairments - at baseline                                        Exercises      General Comments        Pertinent Vitals/Pain Pain Assessment: Faces Faces Pain Scale: Hurts a little bit Pain Location: general Pain Descriptors / Indicators: Guarding;Discomfort Pain Intervention(s): Monitored during session    Home Living                      Prior Function            PT Goals (current  goals can now be found in the care plan section) Acute Rehab PT Goals Patient Stated Goal: regain his strength PT Goal Formulation: With patient Time For Goal Achievement: 08/25/18 Potential to Achieve Goals: Good Progress towards PT goals: Progressing toward goals    Frequency    Min 3X/week      PT Plan Discharge plan needs to be updated    Co-evaluation              AM-PAC PT "6 Clicks" Daily Activity  Outcome Measure  Difficulty turning over in bed (including adjusting bedclothes, sheets and blankets)?: Unable Difficulty moving from lying on back to sitting on the side of the bed? : Unable Difficulty sitting down on and standing up from a chair  with arms (e.g., wheelchair, bedside commode, etc,.)?: Unable Help needed moving to and from a bed to chair (including a wheelchair)?: A Lot Help needed walking in hospital room?: A Lot Help needed climbing 3-5 steps with a railing? : Total 6 Click Score: 8    End of Session   Activity Tolerance: Patient limited by fatigue Patient left: in chair;with call bell/phone within reach;with chair alarm set;with family/visitor present Nurse Communication: Mobility status PT Visit Diagnosis: Unsteadiness on feet (R26.81);Other abnormalities of gait and mobility (R26.89);Muscle weakness (generalized) (M62.81)     Time: 9150-5697 PT Time Calculation (min) (ACUTE ONLY): 28 min  Charges:  $Gait Training: 8-22 mins $Therapeutic Activity: 8-22 mins                     08/12/2018  Donnella Sham, PT Acute Rehabilitation Services [phone]  (pager) [phone]  (office)   William [name] 08/12/2018, 3:13 PM

## 2018-08-12 NOTE — Progress Notes (Signed)
  HEART AND [place] VALVE TEAM   Plan is for patient to go home tomorrow. He was scheduled for PAT on Friday for his TAVR next Tuesday. For the patient and family convenience we will order all preadmission lab work and CXR to be done tomorrow morning before he leaves. All orders have been placed. After these have been completed, he may be discharged home with family. Instructions for surgery have been placed in his chart.    Angelena Form PA-C  [facility]

## 2018-08-12 NOTE — Care Management Note (Signed)
Case Management Note  Patient Details  Name: [name] MRN: [id number] Date of Birth: Dec 13, 1927  Subjective/Objective:    Septic shock d/t Klebsiella UTI, Severe AoS, AAA, CHF,                 Action/Plan: Spoke to pt and wife at bedside. Wife states pt had [facility] prior to admission. Wife declined SNF. NCM explained [facility]. States she will discuss with her dtr. They had planned to hire 24 hour caregiver to assist with pt until his TAVR surgery on 11/5. Contacted [facility] rep, Cory with new referral. He will speak to wife in am to confirm [place]. Will have NCM cancel [facility] 10/31. Will need HH RN, PT, OT and aide orders with [facility]. Has RW and bedside commode at home.   Expected Discharge Date:                  Expected Discharge Plan:  [place]  In-House Referral:  Clinical Social Work  Discharge planning Services  CM Consult  Post Acute Care Choice:  Home Health Choice offered to:  Spouse  DME Arranged:  N/A DME Agency:  NA  HH Arranged:  PT, RN, OT, Nurse's Aide [place] Agency:  [facility]  Status of Service:  In process, will continue to follow  If discussed at Long Length of Stay Meetings, dates discussed:    Additional Comments:  Erenest Rasher, RN 08/12/2018, 5:58 PM

## 2018-08-12 NOTE — Care Management Important Message (Signed)
Important Message  Patient Details  Name: William [name] MRN: [id number] Date of Birth: 12/25/1927   Medicare Important Message Given:  Yes    Erenest Rasher, RN 08/12/2018, 8:17 AM

## 2018-08-12 NOTE — Progress Notes (Signed)
CSW spoke with patient's  family. Patient's family refused SNF and anticipates patient  going home with home health.   CSW signing off.   Thurmond Butts, [place] Social Worker [phone]

## 2018-08-12 NOTE — Care Management Important Message (Signed)
Important Message  Patient Details  Name: William [name] MRN: [id number] Date of Birth: [birth year]   Medicare Important Message Given:  Yes    Ausar Georgiou 08/12/2018, 9:46 AM

## 2018-08-12 NOTE — NC FL2 (Signed)
[place] LEVEL OF CARE SCREENING TOOL     IDENTIFICATION  Patient Name: William [name] Birthdate: [birth year] Sex: male Admission Date (Current Location): 08/07/2018  [facility] and [place] Number:  Herbalist and Address:  [facility], [place] [address], [place], [place] [address]      Provider Number: [id number]  Attending Physician Name and Address:  Thurnell Lose, MD  Relative Name and Phone Number:  Cindy Hazy, spouse, [phone]    Current Level of Care: Hospital Recommended Level of Care: [facility] Prior Approval Number:    Date Approved/Denied:   PASRR Number: pending  Discharge Plan: SNF    Current Diagnoses: Patient Active Problem List   Diagnosis Date Noted  . Acute respiratory failure ([place])   . Pressure injury of skin 08/08/2018  . Sepsis secondary to UTI ([place]) 08/07/2018  . Chronic systolic CHF (congestive heart failure) ([place]) 08/07/2018  . Elevated bilirubin 08/07/2018  . Shock ([place]) 08/07/2018  . HCAP (healthcare-associated pneumonia) 08/07/2018  . Nonrheumatic aortic valve stenosis   . Acute on chronic systolic heart failure ([place]) 07/02/2018  . Encounter for monitoring amiodarone therapy 01/20/2017  . Prostate cancer ([place]) 01/20/2014  . Cardiomyopathy ([place]) 01/12/2014  . S/P ICD (internal cardiac defibrillator) procedure, 01/11/14 removal of ERI gen and placement of Medtronic Evera XT VR & NEW Right ventricular lead Medtronic 01/12/2014  . Sustained VT (ventricular tachycardia) ([place]) 05/28/2013  . Nonischemic cardiomyopathy ([place]) 05/28/2013  . Hyperlipidemia 05/28/2013  . OSA (obstructive sleep apnea) - presumptive diagnosis 05/28/2013  . AAA (abdominal aortic aneurysm) ([place]) 05/28/2013  . Coronary atherosclerosis 05/28/2013    Orientation RESPIRATION BLADDER Height & Weight     Self  Normal Incontinent, External catheter Weight: 103.7 kg Height:  5\' 10"  (177.8 cm)   BEHAVIORAL SYMPTOMS/MOOD NEUROLOGICAL BOWEL NUTRITION STATUS      Incontinent Diet(heart healthy; please see DC summary')  AMBULATORY STATUS COMMUNICATION OF NEEDS Skin   Extensive Assist Verbally Normal                       Personal Care Assistance Level of Assistance  Bathing, Feeding, Dressing Bathing Assistance: Maximum assistance Feeding assistance: Limited assistance Dressing Assistance: Maximum assistance     Functional Limitations Info  Sight, Hearing, Speech Sight Info: Adequate Hearing Info: Impaired Speech Info: Adequate    SPECIAL CARE FACTORS FREQUENCY  PT (By licensed PT), OT (By licensed OT)     PT Frequency: 5x/week OT Frequency: 5x/week            Contractures Contractures Info: Not present    Additional Factors Info  Code Status, Allergies Code Status Info: Full Allergies Info: Crestor Rosuvastatin, Lipitor Atorvastatin           Current Medications (08/12/2018):  This is the current hospital active medication list Current Facility-Administered Medications  Medication Dose Route Frequency Provider Last Rate Last Dose  . acetaminophen (TYLENOL) tablet 650 mg  650 mg Oral Q6H PRN Cherene Altes, MD      . amiodarone (PACERONE) tablet 100 mg  100 mg Oral Daily Reubin Milan, MD   100 mg at 08/12/18 0953  . aspirin chewable tablet 81 mg  81 mg Oral Daily Reubin Milan, MD   81 mg at 08/12/18 6578  . cefdinir (OMNICEF) capsule 300 mg  300 mg Oral Q12H Cherene Altes, MD   300 mg at 08/12/18 0953  . cholecalciferol (VITAMIN D)  tablet 2,000 Units  2,000 Units Oral QHS Reubin Milan, MD   2,000 Units at 08/11/18 2124  . ezetimibe-simvastatin (VYTORIN) 10-20 MG per tablet 1 tablet  1 tablet Oral QHS Reubin Milan, MD   1 tablet at 08/11/18 2124  . furosemide (LASIX) tablet 20 mg  20 mg Oral Daily Cherene Altes, MD   20 mg at 08/12/18 0953  . heparin injection 5,000 Units  5,000 Units Subcutaneous Q8H Reubin Milan, MD   5,000 Units at 08/12/18 [phone]  . metoprolol succinate (TOPROL-XL) 24 hr tablet 50 mg  50 mg Oral Daily Ollis, Brandi L, NP   50 mg at 08/11/18 0943  . ondansetron (ZOFRAN) injection 4 mg  4 mg Intravenous Q6H PRN Reubin Milan, MD      . pantoprazole (PROTONIX) EC tablet 40 mg  40 mg Oral Daily Minor, Grace Bushy, NP   40 mg at 08/12/18 0953  . polyethylene glycol (MIRALAX / GLYCOLAX) packet 17 g  17 g Oral QPM Reubin Milan, MD   17 g at 08/11/18 1837  . potassium chloride (K-DUR,KLOR-CON) CR tablet 10 mEq  10 mEq Oral Daily Reubin Milan, MD   10 mEq at 08/12/18 0953  . sulfacetamide (BLEPH-10) 10 % ophthalmic solution 1 drop  1 drop Right Eye QID Tanda Rockers, MD   1 drop at 08/12/18 1694     Discharge Medications: Please see discharge summary for a list of discharge medications.  Relevant Imaging Results:  Relevant Lab Results:   Additional Information SSN: [id number]  Estanislado Emms, LCSW

## 2018-08-12 NOTE — Progress Notes (Signed)
William [name]  PYK:[id number] DOB: [birth year] DOA: 08/07/2018 PCP: Haywood Pao, MD    Brief Narrative:   84yo M w/ a hx of severe AoS scheduled for TAVR, CHF w/ EF 20%, VT s/p AICD, prostate CA, and frequent UTI's who was admitted 10/25 with sepsis from a presumed urinary tract infection.  Significant Events: 10/25 admit  10/27 off vasopressors 10/28 [facility] assume care   Subjective:  Patient in bed, appears comfortable, denies any headache, no fever, no chest pain or pressure, no shortness of breath , no abdominal pain. No focal weakness, improved pain in his R wrist.   Assessment & Plan:  Septic Shock due to Klebsiella UTI  -  Shock/sepsis physiology resolved -Klebsiella is pansensitive has been transitioned to oral Omnicef, discharged on 3 more days of that tomorrow.  Severe AoS - he is due for a TAVR procedure in the coming weeks, he had a preop evaluation visit on 08/12/2018, have requested cardiology to evaluate him here.  AAA - Followed by [facility]  Chronic combined diastolic and systolic heart failure with EF around 25% on recent echocardiogram.  Currently euvolemic and compensated, new combination of beta-blocker, aspirin, blood pressure too low for ACE/ARB.  Hx VT - s/p AICD   Dementia - Appears to be mild - not agitated remains at risk for delirium, minimize narcotics and benzodiazepines.  Right wrist closed distal radial ulnar joint dorsal dislocation of the ulna -  Developed acute pain in joint 10/27 after forcibly trying to remove his wrist band - Ortho has evaluated - felt to be an acute exac of a chronic problem - surgical intervention not indicated at this time, continue splint.  Suspicion of OSA  - Outpatient sleep evaluation per PCCM   Weakness and deconditioning.  PT OT.  Patient and family leaning more towards going home, if stable discharge tomorrow.    DVT prophylaxis: SQ heparin  Code Status: FULL CODE Family Communication: spoke w/ wife and  daughter at bedside  Disposition Plan: stable for transfer - PT/OT   Consultants:  PCCM Ortho    Objective: Blood pressure 100/64, pulse 86, temperature 97.9 F (36.6 C), temperature source Oral, resp. rate (!) 25, height 5\' 10"  (1.778 m), weight 103.7 kg, SpO2 98 %.  Intake/Output Summary (Last 24 hours) at 08/12/2018 0928 Last data filed at 08/12/2018 0530 Gross per 24 hour  Intake 240 ml  Output 1900 ml  Net -1660 ml   Filed Weights   08/10/18 0500 08/10/18 1912 08/11/18 0425  Weight: 102.6 kg 105.1 kg 103.7 kg    Examination:  Awake Oriented X 1, No new F.N deficits, Normal affect [place].AT,PERRAL Supple Neck,No JVD, No cervical lymphadenopathy appriciated.  Symmetrical Chest wall movement, Good air movement bilaterally, CTAB RRR,No Gallops, Rubs or new Murmurs, No Parasternal Heave +ve B.Sounds, Abd Soft, No tenderness, No organomegaly appriciated, No rebound - guarding or rigidity. No Cyanosis, Clubbing or edema, R wrist painful ROM.   CBC: Recent Labs  Lab 08/07/18 0512  08/08/18 0012 08/09/18 0224 08/11/18 0524  WBC 8.4   < > 9.0 5.3 6.7  NEUTROABS 7.0  --  6.2 3.5  --   HGB 13.4   < > 12.6* 11.5* 11.7*  HCT 38.4*   < > 36.1* 33.4* 33.7*  MCV 95.8   < > 96.5 95.4 95.7  PLT 137*   < > 150 114* 143*   < > = values in this interval not displayed.   Basic Metabolic Panel:  Recent Labs  Lab 08/07/18 0512 08/07/18 1411  08/10/18 0709 08/11/18 0524 08/12/18 0358  NA 134* 138   < > 134* 134* 136  K 3.6 3.2*   < > 3.7 3.4* 3.9  CL 100 107   < > 104 102 104  CO2 26 25   < > 22 25 23   GLUCOSE 164* 102*   < > 162* 144* 134*  BUN 16 12   < > 15 17 17   CREATININE 1.30* 1.16   < > 0.95 1.04 0.93  CALCIUM 8.8* 8.2*   < > 8.4* 8.4* 8.4*  MG 2.2 2.2  --   --  2.2  --   PHOS 3.6 3.3  --   --   --   --    < > = values in this interval not displayed.   GFR: Estimated Creatinine Clearance: 66.2 mL/min (by C-G formula based on SCr of 0.93 mg/dL).  Liver Function  Tests: Recent Labs  Lab 08/07/18 0512 08/07/18 1411 08/08/18 0012 08/11/18 0524  AST 21 20 20 21   ALT 15 13 12 16   ALKPHOS 85 85 82 64  BILITOT 1.7* 1.8* 1.5* 1.4*  PROT 6.3* 6.0* 6.1* 5.9*  ALBUMIN 3.3* 3.0* 2.9* 2.5*   Recent Labs  Lab 08/07/18 1411  LIPASE 26  AMYLASE 19*    Coagulation Profile: Recent Labs  Lab 08/07/18 0512 08/07/18 1411  INR 1.13 1.25    Cardiac Enzymes: Recent Labs  Lab 08/07/18 1411 08/07/18 1816 08/08/18 0012  TROPONINI 0.03* <0.03 0.03*     Recent Results (from the past 240 hour(s))  Culture, blood (Routine x 2)     Status: None (Preliminary result)   Collection Time: 08/07/18  5:00 AM  Result Value Ref Range Status   Specimen Description BLOOD LEFT ARM  Final   Special Requests   Final    BOTTLES DRAWN AEROBIC AND ANAEROBIC Blood Culture results may not be optimal due to an excessive volume of blood received in culture bottles   Culture   Final    NO GROWTH 4 DAYS Performed at [facility], [place] [address], [place], [place] [address]    Report Status PENDING  Incomplete  Culture, blood (Routine x 2)     Status: None (Preliminary result)   Collection Time: 08/07/18  5:15 AM  Result Value Ref Range Status   Specimen Description BLOOD RIGHT HAND  Final   Special Requests   Final    BOTTLES DRAWN AEROBIC ONLY Blood Culture adequate volume   Culture   Final    NO GROWTH 4 DAYS Performed at [facility], [place] [address], [place], [place] [address]    Report Status PENDING  Incomplete  Urine culture     Status: Abnormal   Collection Time: 08/07/18  5:38 AM  Result Value Ref Range Status   Specimen Description URINE, RANDOM  Final   Special Requests   Final    NONE Performed at [facility], [place] [address] [place], [place] [address]    Culture >=100,000 COLONIES/mL KLEBSIELLA PNEUMONIAE (A)  Final   Report Status 08/09/2018 FINAL  Final   Organism ID, Bacteria KLEBSIELLA PNEUMONIAE (A)  Final       Susceptibility   Klebsiella pneumoniae - MIC*    AMPICILLIN >=32 RESISTANT Resistant     CEFAZOLIN <=4 SENSITIVE Sensitive     CEFTRIAXONE <=1 SENSITIVE Sensitive     CIPROFLOXACIN <=0.25 SENSITIVE Sensitive  GENTAMICIN <=1 SENSITIVE Sensitive     IMIPENEM <=0.25 SENSITIVE Sensitive     NITROFURANTOIN <=16 SENSITIVE Sensitive     TRIMETH/SULFA <=20 SENSITIVE Sensitive     AMPICILLIN/SULBACTAM 4 SENSITIVE Sensitive     PIP/TAZO <=4 SENSITIVE Sensitive     Extended ESBL NEGATIVE Sensitive     * >=100,000 COLONIES/mL KLEBSIELLA PNEUMONIAE  Culture, blood (routine x 2)     Status: None (Preliminary result)   Collection Time: 08/07/18  2:11 PM  Result Value Ref Range Status   Specimen Description BLOOD LEFT ANTECUBITAL  Final   Special Requests   Final    BOTTLES DRAWN AEROBIC AND ANAEROBIC Blood Culture adequate volume   Culture   Final    NO GROWTH 4 DAYS Performed at [facility], [place] [address], [place], [place] [address]    Report Status PENDING  Incomplete  Culture, blood (routine x 2)     Status: None (Preliminary result)   Collection Time: 08/07/18  2:11 PM  Result Value Ref Range Status   Specimen Description BLOOD BLOOD LEFT HAND  Final   Special Requests   Final    BOTTLES DRAWN AEROBIC AND ANAEROBIC Blood Culture adequate volume   Culture   Final    NO GROWTH 4 DAYS Performed at [facility], [place] [address], [place], [place] [address]    Report Status PENDING  Incomplete  MRSA PCR Screening     Status: None   Collection Time: 08/07/18  2:11 PM  Result Value Ref Range Status   MRSA by PCR NEGATIVE NEGATIVE Final    Comment:        The GeneXpert MRSA Assay (FDA approved for NASAL specimens only), is one component of a comprehensive MRSA colonization surveillance program. It is not intended to diagnose MRSA infection nor to guide or monitor treatment for MRSA infections. Performed at [facility], [place] [address] [place], [place] [address]        Scheduled Meds: . amiodarone  100 mg Oral Daily  . aspirin  81 mg Oral Daily  . cefdinir  300 mg Oral Q12H  . cholecalciferol  2,000 Units Oral QHS  . ezetimibe-simvastatin  1 tablet Oral QHS  . furosemide  20 mg Oral Daily  . heparin  5,000 Units Subcutaneous Q8H  . metoprolol succinate  50 mg Oral Daily  . pantoprazole  40 mg Oral Daily  . polyethylene glycol  17 g Oral QPM  . potassium chloride  10 mEq Oral Daily  . sulfacetamide  1 drop Right Eye QID     LOS: 5 days   Signature  Lala Lund M.D on 08/12/2018 at 9:28 AM   -  To page go to [email or web address] - password [facility]

## 2018-08-13 ENCOUNTER — Inpatient Hospital Stay (HOSPITAL_COMMUNITY): Payer: Medicare Other

## 2018-08-13 LAB — CBC
HCT: 36.6 % — ABNORMAL LOW (ref 39.0–52.0)
Hemoglobin: 12.7 g/dL — ABNORMAL LOW (ref 13.0–17.0)
MCH: 32.9 pg (ref 26.0–34.0)
MCHC: 34.7 g/dL (ref 30.0–36.0)
MCV: 94.8 fL (ref 80.0–100.0)
NRBC: 0 % (ref 0.0–0.2)
PLATELETS: 155 10*3/uL (ref 150–400)
RBC: 3.86 MIL/uL — ABNORMAL LOW (ref 4.22–5.81)
RDW: 15.3 % (ref 11.5–15.5)
WBC: 5.9 10*3/uL (ref 4.0–10.5)

## 2018-08-13 LAB — PROTIME-INR
INR: 1.09
Prothrombin Time: 14 seconds (ref 11.4–15.2)

## 2018-08-13 LAB — APTT: APTT: 37 s — AB (ref 24–36)

## 2018-08-13 LAB — TYPE AND SCREEN
ABO/RH(D): A POS
Antibody Screen: NEGATIVE

## 2018-08-13 LAB — URINALYSIS, ROUTINE W REFLEX MICROSCOPIC
Bilirubin Urine: NEGATIVE
GLUCOSE, UA: NEGATIVE mg/dL
HGB URINE DIPSTICK: NEGATIVE
Ketones, ur: NEGATIVE mg/dL
Leukocytes, UA: NEGATIVE
Nitrite: NEGATIVE
Protein, ur: NEGATIVE mg/dL
SPECIFIC GRAVITY, URINE: 1.014 (ref 1.005–1.030)
pH: 7 (ref 5.0–8.0)

## 2018-08-13 LAB — HEMOGLOBIN A1C
HEMOGLOBIN A1C: 5.2 % (ref 4.8–5.6)
Mean Plasma Glucose: 102.54 mg/dL

## 2018-08-13 LAB — COMPREHENSIVE METABOLIC PANEL
ALT: 50 U/L — ABNORMAL HIGH (ref 0–44)
ANION GAP: 7 (ref 5–15)
AST: 44 U/L — ABNORMAL HIGH (ref 15–41)
Albumin: 2.7 g/dL — ABNORMAL LOW (ref 3.5–5.0)
Alkaline Phosphatase: 80 U/L (ref 38–126)
BILIRUBIN TOTAL: 1.6 mg/dL — AB (ref 0.3–1.2)
BUN: 18 mg/dL (ref 8–23)
CALCIUM: 8.6 mg/dL — AB (ref 8.9–10.3)
CO2: 26 mmol/L (ref 22–32)
Chloride: 101 mmol/L (ref 98–111)
Creatinine, Ser: 1.04 mg/dL (ref 0.61–1.24)
GFR calc Af Amer: >60 mL/min (ref >60)
GLUCOSE: 137 mg/dL — AB (ref 70–99)
Potassium: 3.5 mmol/L (ref 3.5–5.1)
SODIUM: 134 mmol/L — AB (ref 135–145)
TOTAL PROTEIN: 6.4 g/dL — AB (ref 6.5–8.1)

## 2018-08-13 LAB — BLOOD GAS, ARTERIAL
ACID-BASE EXCESS: 1.6 mmol/L (ref 0.0–2.0)
Bicarbonate: 24.6 mmol/L (ref 20.0–28.0)
DRAWN BY: 511471
FIO2: 21
O2 SAT: 93.8 %
PATIENT TEMPERATURE: 97.9
PH ART: 7.503 — AB (ref 7.350–7.450)
pCO2 arterial: 31.4 mmHg — ABNORMAL LOW (ref 32.0–48.0)
pO2, Arterial: 63.9 mmHg — ABNORMAL LOW (ref 83.0–108.0)

## 2018-08-13 LAB — BRAIN NATRIURETIC PEPTIDE: B Natriuretic Peptide: 459.6 pg/mL — ABNORMAL HIGH (ref 0.0–100.0)

## 2018-08-13 LAB — ABO/RH: ABO/RH(D): A POS

## 2018-08-13 MED ORDER — CEFDINIR 300 MG PO CAPS: 300.0000 mg | ORAL_CAPSULE | Freq: Two times a day (BID) | ORAL | 0 refills | Status: DC

## 2018-08-13 MED ORDER — POTASSIUM CHLORIDE CRYS ER 20 MEQ PO TBCR
40.0000 meq | EXTENDED_RELEASE_TABLET | Freq: Once | ORAL | Status: AC
  Administered 2018-08-13: 40 meq via ORAL
  Filled 2018-08-13: qty 2

## 2018-08-13 MED ORDER — CEFDINIR 300 MG PO CAPS
300.0000 mg | ORAL_CAPSULE | Freq: Two times a day (BID) | ORAL | Status: DC
  Administered 2018-08-13: 300 mg via ORAL
  Filled 2018-08-13 (×2): qty 1

## 2018-08-13 MED ORDER — POTASSIUM CHLORIDE CRYS ER 10 MEQ PO TBCR: 10.0000 meq | EXTENDED_RELEASE_TABLET | Freq: Every day | ORAL | Status: DC

## 2018-08-13 NOTE — Discharge Instructions (Signed)
Follow with Primary MD Tisovec, Fransico Him, MD in 7 days   Get CBC, CMP, 2 view Chest X ray -  checked  by Primary MD in 5-7 days    Activity: As tolerated with Full fall precautions use walker/cane & assistance as needed, right arm in splint which has been provided at all times when you are out of the bed  Disposition Home   Diet: Heart Healthy    For Heart failure patients - Check your Weight same time everyday, if you gain over 2 pounds, or you develop in leg swelling, experience more shortness of breath or chest pain, call your Primary MD immediately. Follow Cardiac Low Salt Diet and 1.5 lit/day fluid restriction.  Special Instructions: If you have smoked or chewed Tobacco  in the last 2 yrs please stop smoking, stop any regular Alcohol  and or any Recreational drug use.  On your next visit with your primary care physician please Get Medicines reviewed and adjusted.  Please request your Prim.MD to go over all Hospital Tests and Procedure/Radiological results at the follow up, please get all Hospital records sent to your Prim MD by signing hospital release before you go home.  If you experience worsening of your admission symptoms, develop shortness of breath, life threatening emergency, suicidal or homicidal thoughts you must seek medical attention immediately by calling 911 or calling your MD immediately  if symptoms less severe.  You Must read complete instructions/literature along with all the possible adverse reactions/side effects for all the Medicines you take and that have been prescribed to you. Take any new Medicines after you have completely understood and accpet all the possible adverse reactions/side effects.

## 2018-08-13 NOTE — Discharge Summary (Signed)
William [name] DJM:[id number] DOB: [birth year] DOA: 08/07/2018  PCP: Haywood Pao, MD  Admit date: 08/07/2018  Discharge date: 08/13/2018  Admitted From: Home   Disposition:  Home   Recommendations for Outpatient Follow-up:   Follow up with PCP in 1-2 weeks  PCP Please obtain BMP/CBC, 2 view CXR in 1week,  (see Discharge instructions)   PCP Please follow up on the following pending results:    Home Health: PT,OT,RN   Equipment/Devices: R.wrost splint  Consultations: Ortho Discharge Condition: Fair   CODE STATUS: Full   Diet Recommendation: Heart Healthy      Chief Complaint  Patient presents with  . Code Sepsis  . Urinary Tract Infection  . Altered Mental Status     Brief history of present illness from the day of admission and additional interim summary    Patient in bed, appears comfortable, denies any headache, no fever, no chest pain or pressure, no shortness of breath , no abdominal pain. No focal weakness, improved pain in his R wrist.                                                                   Hospital Course    Septic Shock due to [place]  -  Shock/sepsis physiology resolved - Klebsiella is pansensitive has been transitioned to oral Omnicef, he is symptom-free and will be discharged home with on 5 more doses of Omnicef .  Severe AoS - he is due for a TAVR procedure in the coming weeks, he had a preop evaluation visit on 08/12/2018, has been seen by cardiology preop here while he was in the hospital.  AAA - Followed by [facility]  Chronic combined diastolic and systolic heart failure with EF around 25% on recent echocardiogram.  Currently euvolemic and compensated, new combination of beta-blocker, aspirin, blood pressure too low for ACE/ARB.  Hx VT - s/p AICD    Dementia - Appears to be mild - not agitated remains at risk for delirium, minimize narcotics and benzodiazepines.  Right wrist closed distal radial ulnar joint dorsal dislocation of the ulna -  Developed acute pain in joint 10/27 after forcibly trying to remove his wrist band - Ortho has evaluated - felt to be an acute exac of a chronic problem - surgical intervention not indicated at this time, continue splint also have him follow with orthopedics and PCP post discharge, splint provided home PT OT also ordered.  Suspicion of OSA  - Outpatient sleep evaluationper PCCM   Weakness and deconditioning.  PT OT.  Patient and family leaning more towards going home, stable discharge home with home health, he refused SNF.   Discharge diagnosis     Principal Problem:   Sepsis secondary to UTI [facility]) Active Problems:   Sustained VT (ventricular tachycardia) ([place])  Hyperlipidemia   OSA (obstructive sleep apnea) - presumptive diagnosis   Coronary atherosclerosis   Nonrheumatic aortic valve stenosis   Chronic systolic CHF (congestive heart failure) ([facility])   Elevated bilirubin   Shock ([place])   HCAP (healthcare-associated pneumonia)   Pressure injury of skin   Acute respiratory failure ([place])    Discharge instructions    Discharge Instructions    Diet - low sodium heart healthy   Complete by:  As directed    Discharge instructions   Complete by:  As directed    Follow with Primary MD Tisovec, Fransico Him, MD in 7 days   Get CBC, CMP, 2 view Chest X ray -  checked  by Primary MD in 5-7 days    Activity: As tolerated with Full fall precautions use walker/cane & assistance as needed, right arm in splint which has been provided at all times when you are out of the bed  Disposition Home   Diet: Heart Healthy    For Heart failure patients - Check your Weight same time everyday, if you gain over 2 pounds, or you develop in leg swelling, experience more shortness of breath or chest pain,  call your Primary MD immediately. Follow Cardiac Low Salt Diet and 1.5 lit/day fluid restriction.  Special Instructions: If you have smoked or chewed Tobacco  in the last 2 yrs please stop smoking, stop any regular Alcohol  and or any Recreational drug use.  On your next visit with your primary care physician please Get Medicines reviewed and adjusted.  Please request your Prim.MD to go over all Hospital Tests and Procedure/Radiological results at the follow up, please get all Hospital records sent to your Prim MD by signing hospital release before you go home.  If you experience worsening of your admission symptoms, develop shortness of breath, life threatening emergency, suicidal or homicidal thoughts you must seek medical attention immediately by calling 911 or calling your MD immediately  if symptoms less severe.  You Must read complete instructions/literature along with all the possible adverse reactions/side effects for all the Medicines you take and that have been prescribed to you. Take any new Medicines after you have completely understood and accpet all the possible adverse reactions/side effects.   Increase activity slowly   Complete by:  As directed       Discharge Medications   Allergies as of 08/13/2018      Reactions   Crestor [rosuvastatin] Other (See Comments)   Hurts muscles   Lipitor [atorvastatin] Other (See Comments)   Hurts stomach      Medication List    STOP taking these medications   sulfamethoxazole-trimethoprim 800-160 MG tablet Commonly known as:  BACTRIM DS,SEPTRA DS     TAKE these medications   acetaminophen 500 MG tablet Commonly known as:  TYLENOL Take 500 mg by mouth every 6 (six) hours as needed (for pain).   amiodarone 200 MG tablet Commonly known as:  PACERONE TAKE 1/2 TABLET BY MOUTH EVERY DAY   aspirin 81 MG chewable tablet Chew 1 tablet (81 mg total) by mouth daily.   cefdinir 300 MG capsule Commonly known as:  OMNICEF Take 1  capsule (300 mg total) by mouth every 12 (twelve) hours.   cholecalciferol 1000 units tablet Commonly known as:  VITAMIN D Take 2,000 Units by mouth at bedtime.   ezetimibe-simvastatin 10-20 MG tablet Commonly known as:  VYTORIN Take 1 tablet by mouth at bedtime.   furosemide 20 MG tablet Commonly known as:  LASIX Take 1 tablet (20 mg total) by mouth daily.   metoprolol succinate 50 MG 24 hr tablet Commonly known as:  TOPROL-XL Take 1 tablet (50 mg total) daily by mouth. Take with or immediately following a meal.   polyethylene glycol packet Commonly known as:  MIRALAX / GLYCOLAX Take 17 g by mouth every evening.   potassium chloride 10 MEQ tablet Commonly known as:  K-DUR TAKE 1 TABLET BY MOUTH DAILY ALONG WITH LASIX FOR 2 TO 4 DAYS THEN STOP What changed:    how much to take  how to take this  when to take this   PRESERVISION AREDS 2 PO Take 1 tablet by mouth daily.   triamcinolone 0.1 % cream : eucerin Crea Apply 1 application topically as needed for rash or itching.       Follow-up Information    [place]. Go on 08/18/2018.   Why:  Please arrive at the [place] tower by [place] parking and check into admitting at 5:30am. Surgery is scheduled for 7:30am. Nothing to eat after midnight the night before surgery. No medications on the morning of surgery. Bathe with dial soap the night before Contact information: [place] [address] [phone]       Tisovec, Fransico Him, MD. Schedule an appointment as soon as possible for a visit in 1 week(s).   Specialty:  Internal Medicine Contact information: [place] [address] [phone]        Sanda Klein, MD .   Specialty:  Cardiology Contact information: [address] [place] [address] [phone]        Nicholes Stairs, MD. Schedule an appointment as soon as possible for a visit in 1 week(s).   Specialty:   Orthopedic Surgery Contact information: [address] [place] [address] [place] [address] [phone]           Major procedures and Radiology Reports - PLEASE review detailed and final reports thoroughly  -        Dg Chest 2 View  Result Date: 08/07/2018 CLINICAL DATA:  Possible sepsis. EXAM: CHEST - 2 VIEW COMPARISON:  Chest radiograph July 03, 2018 FINDINGS: Stable cardiomegaly. Calcified aortic arch. LEFT AICD in situ. Mild bronchitic changes. Blunted LEFT costophrenic angle with strandy densities LEFT lung base. No pneumothorax. Soft tissue planes and included osseous structures are non suspicious. IMPRESSION: 1. Stable cardiomegaly. 2. Small LEFT pleural effusion with LEFT lung base atelectasis/scarring. 3.  Aortic Atherosclerosis (ICD10-I70.0). Electronically Signed   By: Elon Alas M.D.   On: 08/07/2018 06:34   Dg Elbow 2 Views Right  Result Date: 08/09/2018 CLINICAL DATA:  Extreme dementia.  Concern for elbow injury. EXAM: RIGHT ELBOW - 2 VIEW COMPARISON:  None FINDINGS: Examination is markedly degraded secondary to patient's inability to tolerate conventional positioning four elbow radiographic series. No definite fracture or elbow joint effusion. The elbow is held in persistent flexion. Minimal enthesopathic change involving the medial aspect of the ulna. Joint spaces appear preserved. And IV is seen within the antecubital fossa. Adjacent vascular calcifications. No radiopaque foreign body. IMPRESSION: Degraded examination without definite fracture or elbow joint effusion. Electronically Signed   By: Sandi Mariscal M.D.   On: 08/09/2018 20:04   Dg Wrist 2 Views Right  Result Date: 08/09/2018 CLINICAL DATA:  Pain. EXAM: RIGHT WRIST - 2 VIEW COMPARISON:  None. FINDINGS: Vascular calcifications are noted. The distal ulna appears to be displaced/dislocated in a dorsal direction. There  is an associated fracture seen on the lateral view. The radiocarpal joint remains  intact. No definitive radius fracture noted. Carpal bones demonstrate no obvious fracture. There is severe soft tissue swelling over the distal ulna. The distal ulna also overlaps the lunate and trapezium which is abnormal. IMPRESSION: Dislocation of the distal ulna in a dorsal direction with associated fractures. Associated soft tissue swelling. Electronically Signed   By: Dorise Bullion III M.D   On: 08/09/2018 18:09   Ct Coronary Morph W/cta Cor Nancy Fetter W/ca W/cm &/or Wo/cm  Addendum Date: 08/03/2018   ADDENDUM REPORT: 08/03/2018 16:16 CLINICAL DATA:  Aortic stenosis EXAM: Cardiac TAVR CT TECHNIQUE: The patient was scanned on a Siemens Force 938 slice scanner. A 120 kV retrospective scan was triggered in the descending thoracic aorta at 111 HU's. Gantry rotation speed was 270 msecs and collimation was .9 mm. No beta blockade or nitro were given. The 3D data set was reconstructed in 5% intervals of the R-R cycle. Systolic and diastolic phases were analyzed on a dedicated work station using MPR, MIP and VRT modes. The patient received 80 cc of contrast. FINDINGS: Aortic Valve: Trileaflet calcified with restricted leaflet motion Aorta: Moderate calcific atherosclerosis. The is a large? Penetrating ulcer or bulky plaque in the descending thoracic aorta just distal to the left subclavian take off that would likely preclude transfemoral approach Sinotubular Junction: 31 mm Ascending Thoracic Aorta: 35 mm Aortic Arch: 33 mm Descending Thoracic Aorta: 30 mm Sinus of Valsalva Measurements: Non-coronary: 35 mm Right - coronary: 31 mm Left - coronary: 33 mm Coronary Artery Height above Annulus: Left Main: 12.6 mm above annulus Right Coronary: 17 mm above annulus Virtual Basal Annulus Measurements: Maximum/Minimum Diameter: 27.7 mm x 31.1 mm Perimeter: 93 mm Area: 699 mm2 Coronary Arteries: Sufficient height for deployment Optimum Fluoroscopic Angle for Delivery: LAO 2 Caudal 10 degrees IMPRESSION: 1. Trileaflet AV  with annular area 699 mm2 which is upper normal for 29 mm Sapien 3 or suitable for 34 mm Evolute R 2. Optimum angiographic angle for deployment RAO 2 degrees Caudal 10 degrees 3.  Coronary arteries sufficient height above annulus for deployment 4. Lage bulky mixed plaque/ penetrating ulcer in aorta just distal to take off of left subclavian that would likely preclude trans femoral approach with high embolic risk [place] Electronically Signed   By: Jenkins Rouge M.D.   On: 08/03/2018 16:16   Result Date: 08/03/2018 EXAM: OVER-READ INTERPRETATION  CT CHEST The following report is an over-read performed by radiologist Dr. Vinnie Langton of [facility], [place] on 08/03/2018. This over-read does not include interpretation of cardiac or coronary anatomy or pathology. The coronary CTA interpretation by the cardiologist is attached. COMPARISON:  None. FINDINGS: Extracardiac findings will be described separately under dictation for contemporaneously obtained CTA chest, abdomen and pelvis. IMPRESSION: Please see separate dictation for contemporaneously obtained CTA chest, abdomen and pelvis dated 08/03/2018 for full description of relevant extracardiac findings. Electronically Signed: By: Vinnie Langton M.D. On: 08/03/2018 15:49   Dg Chest Port 1 View  Result Date: 08/08/2018 CLINICAL DATA:  Respiratory failure EXAM: PORTABLE CHEST 1 VIEW COMPARISON:  August 07, 2018 FINDINGS: There is persistent cardiomegaly. Pacemaker leads are attached to the right heart, stable. There is pulmonary venous hypertension. There is interstitial pulmonary edema with small pleural effusions bilaterally. There is no consolidation. There is aortic atherosclerosis. No evident bone lesions. IMPRESSION: Cardiomegaly with pulmonary vascular congestion. Small pleural effusions with interstitial edema. Suspect congestive heart failure. No consolidation. There  is aortic atherosclerosis. Aortic Atherosclerosis (ICD10-I70.0).  Electronically Signed   By: Lowella Grip III M.D.   On: 08/08/2018 01:09   Ct Angio Chest Aorta W &/or Wo Contrast  Result Date: 08/03/2018 CLINICAL DATA:  84 year old male with history of severe aortic stenosis. Preprocedural study for potential transcatheter aortic valve replacement (TAVR) procedure. EXAM: CT ANGIOGRAPHY CHEST, ABDOMEN AND PELVIS TECHNIQUE: Multidetector CT imaging through the chest, abdomen and pelvis was performed using the standard protocol during bolus administration of intravenous contrast. Multiplanar reconstructed images and MIPs were obtained and reviewed to evaluate the vascular anatomy. CONTRAST:  149mL ISOVUE-370 IOPAMIDOL (ISOVUE-370) INJECTION 76% COMPARISON:  CT the abdomen and pelvis 01/23/2015. No prior chest CT. FINDINGS: CTA CHEST FINDINGS Cardiovascular: Heart size is enlarged with left atrial dilatation. There is no significant pericardial fluid, thickening or pericardial calcification. There is aortic atherosclerosis, as well as atherosclerosis of the great vessels of the mediastinum and the coronary arteries, including calcified atherosclerotic plaque in the left main, left anterior descending, left circumflex and right coronary arteries. Severe thickening calcification of the aortic valve. Thickening calcification of the mitral valve. Left-sided pacemaker/AICD with leads terminating in the apex of the right ventricle. Mediastinum/Lymph Nodes: No pathologically enlarged mediastinal or hilar lymph nodes. Esophagus is unremarkable in appearance. No axillary lymphadenopathy. Lungs/Pleura: Several small pulmonary nodules are noted in the lungs bilaterally, the largest of which measures 6 mm in the periphery of the left lower lobe (axial image 51 of series 15). No larger more suspicious appearing pulmonary nodules or masses are noted. No acute consolidative airspace disease. No pleural effusions. Areas of scarring are noted in the lung bases bilaterally.  Musculoskeletal/Soft Tissues: There are no aggressive appearing lytic or blastic lesions noted in the visualized portions of the skeleton. CTA ABDOMEN AND PELVIS FINDINGS Hepatobiliary: Diffuse low attenuation throughout the hepatic parenchyma, indicative of hepatic steatosis. No suspicious cystic or solid hepatic lesions. No intra or extrahepatic biliary ductal dilatation. Gallbladder lumen is filled with innumerable tiny calcified gallstones. No surrounding inflammatory changes to suggest an acute cholecystitis at this time. Pancreas: No pancreatic mass. Multifocal pancreatic ductal ectasia. Diffuse atrophy throughout the body and tail of the pancreas. Several small low-attenuation lesions throughout the body of the pancreas, largest of which measures only 1.4 x 0.8 cm (axial image 98 of series 14), minimally increased in retrospect to prior study from 01/23/2015. No peripancreatic fluid collections or inflammatory changes. Spleen: Unremarkable. Adrenals/Urinary Tract: Multiple low-attenuation nonenhancing lesions in both kidneys, compatible with simple cysts, measuring up to 4.5 cm in the upper pole of the left kidney. Bilateral adrenal glands are normal in appearance. No hydroureteronephrosis. Urinary bladder is largely decompressed. Urinary bladder wall appears thickened (this could be accentuated by decompression). Stomach/Bowel: Normal appearance of the stomach. No pathologic dilatation of small bowel or colon. Numerous colonic diverticulae are noted, particularly in the sigmoid colon, without surrounding inflammatory changes to suggest an acute diverticulitis at this time. Postoperative changes of right hemicolectomy. Vascular/Lymphatic: Aortic atherosclerosis, with fusiform infrarenal abdominal aortic aneurysm measuring up to 4.4 x 4.4 cm in diameter. No dissection noted in the abdominal or pelvic vasculature. Vascular findings and measurements pertinent to potential TAVR procedure, as detailed below. No  pathologically enlarged lymph nodes are noted in the abdomen or pelvis. Reproductive: Prostate gland and seminal vesicles are unremarkable in appearance. Other: No significant volume of ascites.  No pneumoperitoneum. Musculoskeletal: Chronic appearing compression fracture of L1 with 90% loss of anterior vertebral body height. There are no aggressive appearing lytic  or blastic lesions noted in the visualized portions of the skeleton. VASCULAR MEASUREMENTS PERTINENT TO TAVR: AORTA: Minimal Aortic Diameter-26 x 24 mm Severity of Aortic Calcification-severe RIGHT PELVIS: Right Common Iliac Artery - Minimal Diameter-14.6 x 11.3 mm Tortuosity-moderate Calcification-moderate to severe Right External Iliac Artery - Minimal Diameter-10.7 x 10.0 mm Tortuosity-severe Calcification-mild Right Common Femoral Artery - Minimal Diameter-10.3 x 10.4 mm Tortuosity-mild Calcification-mild-to-moderate LEFT PELVIS: Left Common Iliac Artery - Minimal Diameter-14.1 x 12.4 mm Tortuosity-moderate Calcification-moderate to severe Left External Iliac Artery - Minimal Diameter-10.7 x 9.4 mm Tortuosity-moderate to severe Calcification-mild Left Common Femoral Artery - Minimal Diameter-11.0 x 11.0 mm Tortuosity-mild Calcification-moderate Review of the MIP images confirms the above findings. IMPRESSION: 1. Vascular findings and measurements pertinent to potential TAVR procedure, as detailed above. 2. Severe thickening calcification of the aortic valve, compatible with reported clinical history of severe aortic stenosis. 3. Multiple small pulmonary nodules noted throughout the lungs bilaterally, largest of which measures only 6 mm in the periphery of the left lower lobe (axial image 51 of series 15). Non-contrast chest CT at 6 months is recommended. If the nodules are stable at time of repeat CT, then future CT at 18-24 months (from today's scan) is considered optional for low-risk patients, but is recommended for high-risk patients. This  recommendation follows the consensus statement: Guidelines for Management of Incidental Pulmonary Nodules Detected on CT Images: From the Fleischner Society 2017; Radiology 2017; 284:228-243. 4. Several small low-attenuation lesions are noted in the body of the pancreas, largest of which measures only 1.4 x 0.8 cm, relatively similar to remote prior study from 2016. These are statistically likely benign given the patient's age and their behavior. If further evaluation is desired, repeat pancreatic protocol CT could be considered in 2 years to confirm continued stability. This recommendation follows ACR consensus guidelines: Management of Incidental Pancreatic Cysts: A White Paper of the ACR Incidental Findings Committee. J Am Coll Radiol 4193;79:024-097. 5. Aortic atherosclerosis, in addition to left main and 3 vessel coronary artery disease. In addition, there is fusiform aneurysmal dilatation of the infrarenal abdominal aorta which measures up to 4.4 cm in diameter. Recommend followup by ultrasound in 1 year. This recommendation follows ACR consensus guidelines: White Paper of the ACR Incidental Findings Committee II on Vascular Findings. J Am Coll Radiol 2013; 10:789-794. 6. Cholelithiasis without evidence of acute cholecystitis at this time. 7. Circumferential thickening of the urinary bladder wall. Although this could simply be related to under distention of the urinary bladder, even accounting for the decompression of the urinary bladder, the wall appears diffusely thickened. Outpatient referral to Urology could be considered for further evaluation if clinically appropriate. 8. Colonic diverticulosis without evidence of acute diverticulitis at this time. 9. Additional incidental findings, as above. Electronically Signed   By: Vinnie Langton M.D.   On: 08/03/2018 17:00   Vas US Carotid  Result Date: 08/03/2018 Carotid Arterial Duplex Study Indications: Pre-surgical evaluation. Performing Technologist:  Maudry Mayhew MHA, RDMS, RVT, RDCS  Examination Guidelines: A complete evaluation includes B-mode imaging, spectral Doppler, color Doppler, and power Doppler as needed of all accessible portions of each vessel. Bilateral testing is considered an integral part of a complete examination. Limited examinations for reoccurring indications may be performed as noted.  Right Carotid Findings: +----------+--------+--------+--------+--------+--------+           PSV cm/sEDV cm/sStenosisDescribeComments +----------+--------+--------+--------+--------+--------+ CCA Prox  59      10                               +----------+--------+--------+--------+--------+--------+  CCA Distal72      17                               +----------+--------+--------+--------+--------+--------+ ICA Prox  29      11                               +----------+--------+--------+--------+--------+--------+ ICA Distal37      11                               +----------+--------+--------+--------+--------+--------+ ECA       29      5                                +----------+--------+--------+--------+--------+--------+ +----------+--------+-------+----------------+-------------------+           PSV cm/sEDV cmsDescribe        Arm Pressure (mmHG) +----------+--------+-------+----------------+-------------------+ [id number]             Multiphasic, WNL                    +----------+--------+-------+----------------+-------------------+ +---------+--------+--+--------+-+---------+ VertebralPSV cm/s37EDV cm/s9Antegrade +---------+--------+--+--------+-+---------+  Left Carotid Findings: +----------+--------+--------+--------+----------------------+--------+           PSV cm/sEDV cm/sStenosisDescribe              Comments +----------+--------+--------+--------+----------------------+--------+ CCA Prox  84      12                                              +----------+--------+--------+--------+----------------------+--------+ CCA Distal92      21                                             +----------+--------+--------+--------+----------------------+--------+ ICA Prox  55      19              homogeneous and smooth         +----------+--------+--------+--------+----------------------+--------+ ICA Distal65      33                                             +----------+--------+--------+--------+----------------------+--------+ ECA       63      6                                              +----------+--------+--------+--------+----------------------+--------+ +----------+--------+--------+----------------+-------------------+ SubclavianPSV cm/sEDV cm/sDescribe        Arm Pressure (mmHG) +----------+--------+--------+----------------+-------------------+           32              Multiphasic, WNL                    +----------+--------+--------+----------------+-------------------+ +---------+--------+--+--------+-+---------+ VertebralPSV cm/s30EDV cm/s4Antegrade +---------+--------+--+--------+-+---------+  Summary: Right Carotid: Velocities in the right ICA are consistent with a 1-39%  stenosis. Left Carotid: Velocities in the left ICA are consistent with a 1-39% stenosis. Vertebrals:  Bilateral vertebral arteries demonstrate antegrade flow. Subclavians: Normal flow hemodynamics were seen in bilateral subclavian              arteries. *See table(s) above for measurements and observations.  Electronically signed by Ruta Hinds MD on 08/03/2018 at 8:37:19 PM.    Final    Ct Angio Abd/pel W/ And/or W/o  Result Date: 08/03/2018 CLINICAL DATA:  84 year old male with history of severe aortic stenosis. Preprocedural study for potential transcatheter aortic valve replacement (TAVR) procedure. EXAM: CT ANGIOGRAPHY CHEST, ABDOMEN AND PELVIS TECHNIQUE: Multidetector CT imaging through the chest, abdomen and pelvis was  performed using the standard protocol during bolus administration of intravenous contrast. Multiplanar reconstructed images and MIPs were obtained and reviewed to evaluate the vascular anatomy. CONTRAST:  175mL ISOVUE-370 IOPAMIDOL (ISOVUE-370) INJECTION 76% COMPARISON:  CT the abdomen and pelvis 01/23/2015. No prior chest CT. FINDINGS: CTA CHEST FINDINGS Cardiovascular: Heart size is enlarged with left atrial dilatation. There is no significant pericardial fluid, thickening or pericardial calcification. There is aortic atherosclerosis, as well as atherosclerosis of the great vessels of the mediastinum and the coronary arteries, including calcified atherosclerotic plaque in the left main, left anterior descending, left circumflex and right coronary arteries. Severe thickening calcification of the aortic valve. Thickening calcification of the mitral valve. Left-sided pacemaker/AICD with leads terminating in the apex of the right ventricle. Mediastinum/Lymph Nodes: No pathologically enlarged mediastinal or hilar lymph nodes. Esophagus is unremarkable in appearance. No axillary lymphadenopathy. Lungs/Pleura: Several small pulmonary nodules are noted in the lungs bilaterally, the largest of which measures 6 mm in the periphery of the left lower lobe (axial image 51 of series 15). No larger more suspicious appearing pulmonary nodules or masses are noted. No acute consolidative airspace disease. No pleural effusions. Areas of scarring are noted in the lung bases bilaterally. Musculoskeletal/Soft Tissues: There are no aggressive appearing lytic or blastic lesions noted in the visualized portions of the skeleton. CTA ABDOMEN AND PELVIS FINDINGS Hepatobiliary: Diffuse low attenuation throughout the hepatic parenchyma, indicative of hepatic steatosis. No suspicious cystic or solid hepatic lesions. No intra or extrahepatic biliary ductal dilatation. Gallbladder lumen is filled with innumerable tiny calcified gallstones. No  surrounding inflammatory changes to suggest an acute cholecystitis at this time. Pancreas: No pancreatic mass. Multifocal pancreatic ductal ectasia. Diffuse atrophy throughout the body and tail of the pancreas. Several small low-attenuation lesions throughout the body of the pancreas, largest of which measures only 1.4 x 0.8 cm (axial image 98 of series 14), minimally increased in retrospect to prior study from 01/23/2015. No peripancreatic fluid collections or inflammatory changes. Spleen: Unremarkable. Adrenals/Urinary Tract: Multiple low-attenuation nonenhancing lesions in both kidneys, compatible with simple cysts, measuring up to 4.5 cm in the upper pole of the left kidney. Bilateral adrenal glands are normal in appearance. No hydroureteronephrosis. Urinary bladder is largely decompressed. Urinary bladder wall appears thickened (this could be accentuated by decompression). Stomach/Bowel: Normal appearance of the stomach. No pathologic dilatation of small bowel or colon. Numerous colonic diverticulae are noted, particularly in the sigmoid colon, without surrounding inflammatory changes to suggest an acute diverticulitis at this time. Postoperative changes of right hemicolectomy. Vascular/Lymphatic: Aortic atherosclerosis, with fusiform infrarenal abdominal aortic aneurysm measuring up to 4.4 x 4.4 cm in diameter. No dissection noted in the abdominal or pelvic vasculature. Vascular findings and measurements pertinent to potential TAVR procedure, as detailed below. No pathologically enlarged lymph nodes are noted  in the abdomen or pelvis. Reproductive: Prostate gland and seminal vesicles are unremarkable in appearance. Other: No significant volume of ascites.  No pneumoperitoneum. Musculoskeletal: Chronic appearing compression fracture of L1 with 90% loss of anterior vertebral body height. There are no aggressive appearing lytic or blastic lesions noted in the visualized portions of the skeleton. VASCULAR  MEASUREMENTS PERTINENT TO TAVR: AORTA: Minimal Aortic Diameter-26 x 24 mm Severity of Aortic Calcification-severe RIGHT PELVIS: Right Common Iliac Artery - Minimal Diameter-14.6 x 11.3 mm Tortuosity-moderate Calcification-moderate to severe Right External Iliac Artery - Minimal Diameter-10.7 x 10.0 mm Tortuosity-severe Calcification-mild Right Common Femoral Artery - Minimal Diameter-10.3 x 10.4 mm Tortuosity-mild Calcification-mild-to-moderate LEFT PELVIS: Left Common Iliac Artery - Minimal Diameter-14.1 x 12.4 mm Tortuosity-moderate Calcification-moderate to severe Left External Iliac Artery - Minimal Diameter-10.7 x 9.4 mm Tortuosity-moderate to severe Calcification-mild Left Common Femoral Artery - Minimal Diameter-11.0 x 11.0 mm Tortuosity-mild Calcification-moderate Review of the MIP images confirms the above findings. IMPRESSION: 1. Vascular findings and measurements pertinent to potential TAVR procedure, as detailed above. 2. Severe thickening calcification of the aortic valve, compatible with reported clinical history of severe aortic stenosis. 3. Multiple small pulmonary nodules noted throughout the lungs bilaterally, largest of which measures only 6 mm in the periphery of the left lower lobe (axial image 51 of series 15). Non-contrast chest CT at 6 months is recommended. If the nodules are stable at time of repeat CT, then future CT at 18-24 months (from today's scan) is considered optional for low-risk patients, but is recommended for high-risk patients. This recommendation follows the consensus statement: Guidelines for Management of Incidental Pulmonary Nodules Detected on CT Images: From the Fleischner Society 2017; Radiology 2017; 284:228-243. 4. Several small low-attenuation lesions are noted in the body of the pancreas, largest of which measures only 1.4 x 0.8 cm, relatively similar to remote prior study from 2016. These are statistically likely benign given the patient's age and their behavior. If  further evaluation is desired, repeat pancreatic protocol CT could be considered in 2 years to confirm continued stability. This recommendation follows ACR consensus guidelines: Management of Incidental Pancreatic Cysts: A White Paper of the ACR Incidental Findings Committee. J Am Coll Radiol 6967;89:381-017. 5. Aortic atherosclerosis, in addition to left main and 3 vessel coronary artery disease. In addition, there is fusiform aneurysmal dilatation of the infrarenal abdominal aorta which measures up to 4.4 cm in diameter. Recommend followup by ultrasound in 1 year. This recommendation follows ACR consensus guidelines: White Paper of the ACR Incidental Findings Committee II on Vascular Findings. J Am Coll Radiol 2013; 10:789-794. 6. Cholelithiasis without evidence of acute cholecystitis at this time. 7. Circumferential thickening of the urinary bladder wall. Although this could simply be related to under distention of the urinary bladder, even accounting for the decompression of the urinary bladder, the wall appears diffusely thickened. Outpatient referral to Urology could be considered for further evaluation if clinically appropriate. 8. Colonic diverticulosis without evidence of acute diverticulitis at this time. 9. Additional incidental findings, as above. Electronically Signed   By: Vinnie Langton M.D.   On: 08/03/2018 17:00    Micro Results    Recent Results (from the past 240 hour(s))  Culture, blood (Routine x 2)     Status: None   Collection Time: 08/07/18  5:00 AM  Result Value Ref Range Status   Specimen Description BLOOD LEFT ARM  Final   Special Requests   Final    BOTTLES DRAWN AEROBIC AND ANAEROBIC Blood Culture results may  not be optimal due to an excessive volume of blood received in culture bottles   Culture   Final    NO GROWTH 5 DAYS Performed at [facility], [place] [address], [place], [place] [address]    Report Status 08/12/2018 FINAL  Final  Culture, blood (Routine x 2)      Status: None   Collection Time: 08/07/18  5:15 AM  Result Value Ref Range Status   Specimen Description BLOOD RIGHT HAND  Final   Special Requests   Final    BOTTLES DRAWN AEROBIC ONLY Blood Culture adequate volume   Culture   Final    NO GROWTH 5 DAYS Performed at [facility], [place] [address], [place], [place] [address]    Report Status 08/12/2018 FINAL  Final  Urine culture     Status: Abnormal   Collection Time: 08/07/18  5:38 AM  Result Value Ref Range Status   Specimen Description URINE, RANDOM  Final   Special Requests   Final    NONE Performed at [facility], [place] [address] [place], [place] [address]    Culture >=100,000 COLONIES/mL KLEBSIELLA PNEUMONIAE (A)  Final   Report Status 08/09/2018 FINAL  Final   Organism ID, Bacteria KLEBSIELLA PNEUMONIAE (A)  Final      Susceptibility   Klebsiella pneumoniae - MIC*    AMPICILLIN >=32 RESISTANT Resistant     CEFAZOLIN <=4 SENSITIVE Sensitive     CEFTRIAXONE <=1 SENSITIVE Sensitive     CIPROFLOXACIN <=0.25 SENSITIVE Sensitive     GENTAMICIN <=1 SENSITIVE Sensitive     IMIPENEM <=0.25 SENSITIVE Sensitive     NITROFURANTOIN <=16 SENSITIVE Sensitive     TRIMETH/SULFA <=20 SENSITIVE Sensitive     AMPICILLIN/SULBACTAM 4 SENSITIVE Sensitive     PIP/TAZO <=4 SENSITIVE Sensitive     Extended ESBL NEGATIVE Sensitive     * >=100,000 COLONIES/mL KLEBSIELLA PNEUMONIAE  Culture, blood (routine x 2)     Status: None   Collection Time: 08/07/18  2:11 PM  Result Value Ref Range Status   Specimen Description BLOOD LEFT ANTECUBITAL  Final   Special Requests   Final    BOTTLES DRAWN AEROBIC AND ANAEROBIC Blood Culture adequate volume   Culture   Final    NO GROWTH 5 DAYS Performed at [place] [address], [place], [place] [address]    Report Status 08/12/2018 FINAL  Final  Culture, blood (routine x 2)     Status: None   Collection Time: 08/07/18  2:11 PM  Result Value Ref Range Status   Specimen  Description BLOOD BLOOD LEFT HAND  Final   Special Requests   Final    BOTTLES DRAWN AEROBIC AND ANAEROBIC Blood Culture adequate volume   Culture   Final    NO GROWTH 5 DAYS Performed at [facility], [place] [address], [place],  [address]    Report Status 08/12/2018 FINAL  Final  MRSA PCR Screening     Status: None   Collection Time: 08/07/18  2:11 PM  Result Value Ref Range Status   MRSA by PCR NEGATIVE NEGATIVE Final    Comment:        The GeneXpert MRSA Assay (FDA approved for NASAL specimens only), is one component of a comprehensive MRSA colonization surveillance program. It is not intended to diagnose MRSA infection nor to guide or monitor treatment for MRSA infections. Performed at [facility], [place] [address]  [place], [place] [address]     Today   Subjective    Landers Prajapati today has no headache,no chest abdominal pain,no new weakness tingling or numbness, feels much better wants to go home today.    Objective   Blood pressure 99/76, pulse 94, temperature 97.7 F (36.5 C), temperature source Oral, resp. rate 18, height 5\' 10"  (1.157 m), weight 101.2 kg, SpO2 98 %.   Intake/Output Summary (Last 24 hours) at 08/13/2018 0842 Last data filed at 08/13/2018 0559 Gross per 24 hour  Intake -  Output 1250 ml  Net -1250 ml    Exam Awake Alert,   No new F.N deficits, Normal affect [place].AT,PERRAL Supple Neck,No JVD, No cervical lymphadenopathy appriciated.  Symmetrical Chest wall movement, Good air movement bilaterally, CTAB RRR,No Gallops,Rubs or new Murmurs, No Parasternal Heave +ve B.Sounds, Abd Soft, Non tender, No organomegaly appriciated, No rebound -guarding or rigidity. No Cyanosis, Clubbing or edema, No new Rash or bruise, right wrist and splint   Data Review   CBC w Diff:  Lab Results  Component Value Date   WBC 5.9 08/13/2018   HGB 12.7 (L) 08/13/2018   HCT 36.6 (L) 08/13/2018   PLT 155 08/13/2018   LYMPHOPCT 18 08/09/2018     MONOPCT 9 08/09/2018   EOSPCT 6 08/09/2018   BASOPCT 0 08/09/2018    CMP:  Lab Results  Component Value Date   NA 134 (L) 08/13/2018   K 3.5 08/13/2018   CL 101 08/13/2018   CO2 26 08/13/2018   BUN 18 08/13/2018   CREATININE 1.04 08/13/2018   CREATININE 1.12 (H) 02/05/2017   PROT 6.4 (L) 08/13/2018   ALBUMIN 2.7 (L) 08/13/2018   BILITOT 1.6 (H) 08/13/2018   ALKPHOS 80 08/13/2018   AST 44 (H) 08/13/2018   ALT 50 (H) 08/13/2018  .   Total Time in preparing paper work, data evaluation and todays exam - 60 minutes  Lala Lund M.D on 08/13/2018 at 8:42 AM  [facility]   Office  [phone]

## 2018-08-13 NOTE — Care Management Note (Signed)
Case Management Note  Patient Details  Name: TERRIN MEDDAUGH MRN: [id number] Date of Birth: [birth year]  Subjective/Objective:                  Septic shock d/t Klebsiella UTI, Severe AoS, AAA, CHF. From home with wife.   [name] Wehling (Spouse) Jaxson Anglin (Daughter)    [phone]     PCP: Domenick Gong  Action/Plan: Transition to home with the resumption of home health services provided by [facility]. [facility] will provide 24/7  PCS to pt as well once d/c. Transportation to home will be provided per family.  Expected Discharge Date:  08/13/18               Expected Discharge Plan:  [place]  In-House Referral:  Clinical Social Work  Discharge planning Services  CM Consult  Post Acute Care Choice:  Home Health Choice offered to:  Spouse  DME Arranged:  N/A DME Agency:  NA  HH Arranged:  PT, RN, OT, Nurse's Aide [place] Agency:  [facility] (formerly [employer])  Status of Service:  Completed, signed off  If discussed at [employer], dates discussed:    Additional Comments:  Sharin Mons, RN 08/13/2018, 1:04 PM

## 2018-08-13 NOTE — Progress Notes (Signed)
Physical Therapy Treatment Patient Details Name: William [name] MRN: [id number] DOB: 1928/08/03 Today's Date: 08/13/2018    History of Present Illness 84yo M w/ a hx of severe AoS scheduled for TAVR, CHF w/ EF 20%, VT s/p AICD, prostate CA, and frequent UTI's who was admitted 10/25 with sepsis from a presumed urinary tract infection.    PT Comments    Pt has worked on the basics that will be important over the next several days.  Transitions to EOB including scooting, sitting balance and transfers to/from w/c or chair.   Follow Up Recommendations  Home health PT;Supervision/Assistance - 24 hour;Other (comment)     Equipment Recommendations  Other (comment)    Recommendations for Other Services       Precautions / Restrictions Precautions Precautions: Fall Required Braces or Orthoses: Other Brace/Splint Other Brace/Splint: pt with R wrist splint Restrictions Other Position/Activity Restrictions: per ortho note pt okay to WBAT through RUE; if painful may trial platform for RW    Mobility  Bed Mobility Overal bed mobility: Needs Assistance Bed Mobility: Supine to Sit     Supine to sit: Mod assist     General bed mobility comments: up via right elbow.  cues for technique and truncal assist up and forward.  scooting assist.  Transfers Overall transfer level: Needs assistance Equipment used: Rolling walker (2 wheeled) Transfers: Sit to/from [facility] Sit to Stand: Mod assist Stand pivot transfers: Mod assist;+2 safety/equipment       General transfer comment: cues for hand placement.  Assist to come forward.   Ambulation/Gait                 Stairs             Wheelchair Mobility    Modified Rankin (Stroke Patients Only)       Balance     Sitting balance-Leahy Scale: Poor Sitting balance - Comments: eventually with list posteriorly.   Standing balance support: Bilateral upper extremity supported Standing  balance-Leahy Scale: Poor                              Cognition Arousal/Alertness: Awake/alert Behavior During Therapy: WFL for tasks assessed/performed Overall Cognitive Status: History of cognitive impairments - at baseline                                 General Comments: pt with hx of dementia at baseline; requires increased time for processing/following one step commands, suspect partly due to pt is [facility]      Exercises Other Exercises Other Exercises: warm up ROM exercise.    General Comments General comments (skin integrity, edema, etc.): pt going home with 24 hour assist, appropriate equipment including a W/C until back for TAVR Tuesday.      Pertinent Vitals/Pain Pain Location: general Pain Descriptors / Indicators: Grimacing Pain Intervention(s): Monitored during session    Home Living                      Prior Function            PT Goals (current goals can now be found in the care plan section) Acute Rehab PT Goals Patient Stated Goal: regain his strength PT Goal Formulation: With patient Time For Goal Achievement: 08/25/18 Potential to Achieve Goals: Good Progress towards PT goals: Progressing toward goals  Frequency    Min 3X/week      PT Plan Discharge plan needs to be updated    Co-evaluation              AM-PAC PT "6 Clicks" Daily Activity  Outcome Measure  Difficulty turning over in bed (including adjusting bedclothes, sheets and blankets)?: Unable Difficulty moving from lying on back to sitting on the side of the bed? : Unable Difficulty sitting down on and standing up from a chair with arms (e.g., wheelchair, bedside commode, etc,.)?: Unable Help needed moving to and from a bed to chair (including a wheelchair)?: A Lot Help needed walking in hospital room?: A Lot Help needed climbing 3-5 steps with a railing? : Total 6 Click Score: 8    End of Session   Activity Tolerance: Patient limited by  fatigue Patient left: in chair;with call bell/phone within reach;with chair alarm set;with family/visitor present Nurse Communication: Mobility status PT Visit Diagnosis: Unsteadiness on feet (R26.81);Other abnormalities of gait and mobility (R26.89);Muscle weakness (generalized) (M62.81)     Time: 1240-1311 PT Time Calculation (min) (ACUTE ONLY): 31 min  Charges:  $Therapeutic Activity: 23-37 mins                     08/13/2018  Donnella Sham, PT Acute Rehabilitation Services [phone]  (pager) [phone]  (office)   William [name] 08/13/2018, 1:37 PM

## 2018-08-14 ENCOUNTER — Other Ambulatory Visit (HOSPITAL_COMMUNITY): Payer: Medicare Other

## 2018-08-14 ENCOUNTER — Other Ambulatory Visit: Payer: Self-pay | Admitting: Cardiovascular Disease

## 2018-08-14 DIAGNOSIS — I472 Ventricular tachycardia: Secondary | ICD-10-CM | POA: Diagnosis not present

## 2018-08-14 DIAGNOSIS — C61 Malignant neoplasm of prostate: Secondary | ICD-10-CM | POA: Diagnosis not present

## 2018-08-14 DIAGNOSIS — J45909 Unspecified asthma, uncomplicated: Secondary | ICD-10-CM | POA: Diagnosis not present

## 2018-08-14 DIAGNOSIS — I5033 Acute on chronic diastolic (congestive) heart failure: Secondary | ICD-10-CM | POA: Diagnosis not present

## 2018-08-14 DIAGNOSIS — I428 Other cardiomyopathies: Secondary | ICD-10-CM | POA: Diagnosis not present

## 2018-08-14 DIAGNOSIS — I2510 Atherosclerotic heart disease of native coronary artery without angina pectoris: Secondary | ICD-10-CM | POA: Diagnosis not present

## 2018-08-17 ENCOUNTER — Other Ambulatory Visit: Payer: Self-pay

## 2018-08-17 ENCOUNTER — Encounter (HOSPITAL_COMMUNITY): Payer: Self-pay | Admitting: *Deleted

## 2018-08-17 MED ORDER — DOPAMINE-DEXTROSE 3.2-5 MG/ML-% IV SOLN
0.0000 ug/kg/min | INTRAVENOUS | Status: DC
  Filled 2018-08-17: qty 250

## 2018-08-17 MED ORDER — MAGNESIUM SULFATE 50 % IJ SOLN
40.0000 meq | INTRAMUSCULAR | Status: DC
  Filled 2018-08-17: qty 9.85

## 2018-08-17 MED ORDER — SODIUM CHLORIDE 0.9 % IV SOLN
INTRAVENOUS | Status: DC
  Filled 2018-08-17: qty 30

## 2018-08-17 MED ORDER — NOREPINEPHRINE 4 MG/250ML-% IV SOLN
0.0000 ug/min | INTRAVENOUS | Status: AC
  Administered 2018-08-18: 2 ug/min via INTRAVENOUS
  Filled 2018-08-17: qty 250

## 2018-08-17 MED ORDER — VANCOMYCIN HCL 10 G IV SOLR
1500.0000 mg | INTRAVENOUS | Status: AC
  Administered 2018-08-18: 1500 mg via INTRAVENOUS
  Filled 2018-08-17: qty 1500

## 2018-08-17 MED ORDER — DEXMEDETOMIDINE HCL IN NACL 400 MCG/100ML IV SOLN
0.1000 ug/kg/h | INTRAVENOUS | Status: AC
  Administered 2018-08-18: 1 ug/kg/h via INTRAVENOUS
  Filled 2018-08-17: qty 100

## 2018-08-17 MED ORDER — INSULIN REGULAR(HUMAN) IN NACL 100-0.9 UT/100ML-% IV SOLN
INTRAVENOUS | Status: DC
  Filled 2018-08-17: qty 100

## 2018-08-17 MED ORDER — PHENYLEPHRINE HCL-NACL 20-0.9 MG/250ML-% IV SOLN
30.0000 ug/min | INTRAVENOUS | Status: DC
  Filled 2018-08-17: qty 250

## 2018-08-17 MED ORDER — POTASSIUM CHLORIDE 2 MEQ/ML IV SOLN
80.0000 meq | INTRAVENOUS | Status: DC
  Filled 2018-08-17: qty 40

## 2018-08-17 MED ORDER — EPINEPHRINE PF 1 MG/ML IJ SOLN
0.0000 ug/min | INTRAVENOUS | Status: DC
  Filled 2018-08-17: qty 4

## 2018-08-17 MED ORDER — SODIUM CHLORIDE 0.9 % IV SOLN
1.5000 g | INTRAVENOUS | Status: AC
  Administered 2018-08-18: 1.5 g via INTRAVENOUS
  Filled 2018-08-17: qty 1.5

## 2018-08-17 MED ORDER — NITROGLYCERIN IN D5W 200-5 MCG/ML-% IV SOLN
2.0000 ug/min | INTRAVENOUS | Status: DC
  Filled 2018-08-17: qty 250

## 2018-08-17 NOTE — Progress Notes (Signed)
Anesthesia Chart Review: SAME DAY WORK-UP (per TAVR team - due to recent admission)   Case:  [id number] Date/Time:  08/18/18 0715   Procedures:      TRANSCATHETER AORTIC VALVE REPLACEMENT, TRANSFEMORAL (N/A Chest)     TRANSESOPHAGEAL ECHOCARDIOGRAM (TEE) (N/A )   Anesthesia type:  General   Pre-op diagnosis:  Severe Aortic Stenosis   Location:  [facility] OR ROOM 16 / [place] [facility]   Surgeon:  Burnell Blanks, MD    CT surgeon Gilford Raid, MD  DISCUSSION: Patient is a 84 year old male scheduled for the above procedure.   He has severe AS, non-ischemic cardiomyopathy, chronic systolic CHF, VT (s/p ICD 2005; generator change to Medtronic [id number] Evera XT VR ICD 01/11/14), dementia, AAA (4.4 cm by 08/03/18 CTA), dyslipidemia, prostate cancer, OSA (improved after weight loss), hard of hearing, prostate cancer (s/p cryoablation 02/24/14). - He was admitted 08/07/18-08/13/18 for septic shock doe to Klebsiella UTI. Transitioned wot Omnicef. He missed his PAT visit for TAVR due to admission, so pre-operative labs/xrays ordered by TAVR team during his admission. He also developed right wrist pain after forcibly trying to remove his wrist ban with xray showing dislocation of the distal ulna in the dorsal direction with associated fractures and soft tissue swelling. William Dates, MD consulted. His note indicated that distal ulna was easily able to be reduced by hand and felt to be an acute on chronic issue with pre-existing distal ulna radial joint instability. He recommended bracing the wrist and okay to weight-bear through the right upper extremity.   Further evaluation by his anesthesia team on the day of surgery. He has a Medtronic ICD. William [name] with [employer] notified by Allyne Gee, RN.    PROVIDERS: Tisovec, Fransico Him, MD is PCP Sanda Klein, MD is primary cardiologist Allegra Lai, MD is EP cardiologist   LABS: Recent labs include: Lab Results  Component Value Date   WBC 5.9 08/13/2018   HGB  12.7 (L) 08/13/2018   HCT 36.6 (L) 08/13/2018   PLT 155 08/13/2018   GLUCOSE 137 (H) 08/13/2018   ALT 50 (H) 08/13/2018   AST 44 (H) 08/13/2018   NA 134 (L) 08/13/2018   K 3.5 08/13/2018   CL 101 08/13/2018   CREATININE 1.04 08/13/2018   BUN 18 08/13/2018   CO2 26 08/13/2018   TSH 1.75 02/05/2017   INR 1.09 08/13/2018   HGBA1C 5.2 08/13/2018  Repeat UA on 08/13/18 was WNL.   PFTs 08/03/18: FVC 2.36 (61%). FEV1 1.70 (63%). DLCO unc 21.84 (64%).   IMAGES: CXR 08/13/18: IMPRESSION: 1.  Cardiac pacer with lead tips over the right ventricle. 2.  Interim partial clearing of CHF.  CTA chest/abd/pelvis 08/03/18: IMPRESSION: 1. Vascular findings and measurements pertinent to potential TAVR procedure, as detailed above (see full Report). 2. Severe thickening calcification of the aortic valve, compatible with reported clinical history of severe aortic stenosis. 3. Multiple small pulmonary nodules noted throughout the lungs bilaterally, largest of which measures only 6 mm in the periphery of the left lower lobe (axial image 51 of series 15). Non-contrast chest CT at 6 months is recommended. If the nodules are stable at time of repeat CT, then future CT at 18-24 months (from today's scan) is considered optional for low-risk patients, but is recommended for high-risk patients.  4. Several small low-attenuation lesions are noted in the body of the pancreas, largest of which measures only 1.4 x 0.8 cm, relatively similar to remote prior study from 2016. These are  statistically likely benign given the patient's age and their behavior. If further evaluation is desired, repeat pancreatic protocol CT could be considered in 2 years to confirm continued stability.  5. Aortic atherosclerosis, in addition to left main and 3 vessel coronary artery disease. In addition, there is fusiform aneurysmal dilatation of the infrarenal abdominal aorta which measures up to 4.4 cm in diameter. Recommend  followup by ultrasound in 1 year.  6. Cholelithiasis without evidence of acute cholecystitis at this time. 7. Circumferential thickening of the urinary bladder wall. Although this could simply be related to under distention of the urinary bladder, even accounting for the decompression of the urinary bladder, the wall appears diffusely thickened. Outpatient referral to Urology could be considered for further evaluation if clinically appropriate. 8. Colonic diverticulosis without evidence of acute diverticulitis at this time. 9. Additional incidental findings, as above (see full Report).   EKG: 08/07/18: SR, borderline prolonged PR interval. Right BBB and LAFB.    CV: CT coronary 08/03/18: IMPRESSION: 1. Trileaflet AV with annular area 699 mm2 which is upper normal for 29 mm Sapien 3 or suitable for 34 mm Evolute R 2. Optimum angiographic angle for deployment RAO 2 degrees Caudal 10 degrees 3.  Coronary arteries sufficient height above annulus for deployment 4. Lage bulky mixed plaque/ penetrating ulcer in aorta just distal to take off of left subclavian that would likely preclude trans femoral approach with high embolic risk (Dr. Cyndia Bent reviewed, and according to his 08/04/18 note, "There is bulky mixed plaque or penetrating ulcer in the distal aortic arch just beyond the takeoff of the left subclavian artery which is located on the lesser curve of the aorta.  I think the risk of embolism is still relatively low since this is on the lesser curve and the wire and valve will be on the greater curve.  His abdominal and pelvic CTA shows pelvic vascular anatomy suitable for transfemoral insertion.")  RHC/LHC 07/06/18:  Dist RCA lesion is 20% stenosed.  Mid LM lesion is 20% stenosed.  Ost LAD lesion is 20% stenosed.  Prox LAD lesion is 30% stenosed.  Mid LAD lesion is 30% stenosed.  Dist Cx lesion is 50% stenosed.  Ost Cx to Prox Cx lesion is 20% stenosed.  Mid Cx lesion is 30%  stenosed. - Significant coronary calcification involving all coronary arteries with 20% smooth distal left main stenosis; 20-30% proximal and mid LAD stenoses; 20 and 30% proximal and mid AV groove circumflex stenosis with 50% distal stenosis; and significant calcification of the RCA with mild luminal irregularity and 20% narrowing. - Upper normal right heart pressures. - Severe low gradient aortic valve stenosis in this patient with echocardiographic documentation of an ejection fraction of 20 to 25%.  The peak to peak gradient is 33 mm with a mean gradient of 26 mm and aortic valve area of 0.7 to 0.9 cm2. - Supravalvular aortography demonstrates calcified aortic valve with severely reduced excursion.  The aortic root is mildly dilated. RECOMMENDATION: William [name] has severe low gradient aortic valve stenosis which may be contributing to his severe LV dysfunction.  There is significant coronary calcification without high-grade obstructive stenoses.  Depending upon functional status, consider possible surgical evaluation for consideration of aortic valve replacement. Recommend Aspirin 81mg  daily for moderate CAD.  Echo 07/03/18: Study Conclusions - Left ventricle: The cavity size was normal. Wall thickness was   increased in a pattern of moderate LVH. Systolic function was   severely reduced. The estimated ejection fraction was  in the   range of 20% to 25%. Diffuse hypokinesis. Doppler parameters are   consistent with abnormal left ventricular relaxation (grade 1   diastolic dysfunction). Doppler parameters are consistent with   high ventricular filling pressure. - Aortic valve: Valve mobility was restricted. There was severe   stenosis. Valve area (VTI): 0.76 cm^2. Valve area (Vmax): 0.56   cm^2. Valve area (Vmean): 0.66 cm^2. - Aortic root: The aortic root was mildly dilated. - Mitral valve: Calcified annulus. Mildly thickened leaflets .   There was mild regurgitation. - Left  atrium: The atrium was severely dilated. - Right atrium: The atrium was mildly dilated. - Pulmonary arteries: Systolic pressure was mildly increased. PA   peak pressure: 36 mm Hg (S). Impressions: - Severe global reduction in LV systolic function; mild diastolic   dysfunction; moderate LVH; mildly dilated aortic root; severe AS;   mild MR; biatrial enlargement; mild TR with mildly elevated   pulmonary pressure.  Carotid U/S 08/03/18: Summary: Right Carotid: Velocities in the right ICA are consistent with a 1-39% stenosis. Left Carotid: Velocities in the left ICA are consistent with a 1-39% stenosis. Vertebrals: Bilateral vertebral arteries demonstrate antegrade flow. Subclavians: Normal flow hemodynamics were seen in bilateral subclavian arteries.   Past Medical History:  Diagnosis Date  . AAA (abdominal aortic aneurysm) ([place])    7/13 3.8cm  . Arthritis    "joints" (01/11/2014)  . Asthma    "seasonal; some foods"   . Automatic implantable cardioverter-defibrillator in situ   . Chronic systolic CHF (congestive heart failure) ([place])   . Dementia ([place])   . Dyslipidemia   . Hemorrhage    Post-colonostomy  . HLD (hyperlipidemia)   . HOH (hard of hearing)   . Lumbar vertebral fracture ([facility])    L1- 04/11/2014   . Prostate cancer ([place])   . S/P ICD (internal cardiac defibrillator) procedure, 01/11/14 removal of ERI gen and placement of Medtronic Evera XT VR & NEW Right ventricular lead Medtronic 01/12/2014  . Severe aortic stenosis   . Sleep apnea    "lost 60# & don't have it anymore" (01/11/2014)  . Ventricular tachycardia ([place])   . Wears hearing aid in both ears     Past Surgical History:  Procedure Laterality Date  . CARDIAC CATHETERIZATION  08/20/2004   noncritical CAD,mild global hypokinesis, EF 50%  . CARDIAC DEFIBRILLATOR PLACEMENT  08/23/2004   Medtronic  . CATARACT EXTRACTION W/ INTRAOCULAR LENS IMPLANT Right   . COLECTOMY  1990's  . CRYOABLATION N/A 02/24/2014    Procedure: CRYO ABLATION PROSTATE;  Surgeon: Ailene Rud, MD;  Location: [facility] ORS;  Service: Urology;  Laterality: N/A;  . HERNIA REPAIR     "abdomen; from colon OR"  . ICD GENERATOR CHANGE  01/11/2014   "upgrade"  . IMPLANTABLE CARDIOVERTER DEFIBRILLATOR (ICD) GENERATOR CHANGE N/A 01/11/2014   Procedure: ICD GENERATOR CHANGE;  Surgeon: Sanda Klein, MD;  Location: [facility] CATH LAB;  Service: Cardiovascular;  Laterality: N/A;  . LEAD REVISION N/A 01/11/2014   Procedure: LEAD REVISION;  Surgeon: Sanda Klein, MD;  Location: [facility] CATH LAB;  Service: Cardiovascular;  Laterality: N/A;  . NM MYOCAR PERF WALL MOTION  01/29/2012   abnormal c/o infarct/scar,no ischemia present  . PROSTATE BIOPSY N/A 11/22/2013   Procedure: PROSTATE BIOPSY AND ULTRASOUND;  Surgeon: Ailene Rud, MD;  Location: [facility] ORS;  Service: Urology;  Laterality: N/A;  . RIGHT/LEFT HEART CATH AND CORONARY ANGIOGRAPHY N/A 07/06/2018   Procedure: RIGHT/LEFT HEART CATH AND CORONARY ANGIOGRAPHY;  Surgeon: Troy Sine, MD;  Location: [facility] CV LAB;  Service: Cardiovascular;  Laterality: N/A;    MEDICATIONS: . [START ON 08/18/2018] cefUROXime (ZINACEF) 1.5 g in sodium chloride 0.9 % 100 mL IVPB  . [START ON 08/18/2018] dexmedetomidine (PRECEDEX) 400 MCG/100ML (4 mcg/mL) infusion  . [START ON 08/18/2018] DOPamine (INTROPIN) 800 mg in dextrose 5 % 250 mL (3.2 mg/mL) infusion  . [START ON 08/18/2018] EPINEPHrine (ADRENALIN) 4 mg in dextrose 5 % 250 mL (0.016 mg/mL) infusion  . [START ON 08/18/2018] heparin 30,000 units/NS 1000 mL solution for CELLSAVER  . [START ON 08/18/2018] insulin regular, human (MYXREDLIN) 100 units/ 100 mL infusion  . [START ON 08/18/2018] magnesium sulfate (IV Push/IM) injection 40 mEq  . [START ON 08/18/2018] nitroGLYCERIN 50 mg in dextrose 5 % 250 mL (0.2 mg/mL) infusion  . [START ON 08/18/2018] norepinephrine (LEVOPHED) 4mg  in D5W 254mL premix infusion  . [START ON 08/18/2018] phenylephrine (NEOSYNEPHRINE)  20-0.9 MG/250ML-% infusion  . [START ON 08/18/2018] potassium chloride injection 80 mEq  . [START ON 08/18/2018] vancomycin (VANCOCIN) 1,500 mg in sodium chloride 0.9 % 250 mL IVPB   . acetaminophen (TYLENOL) 500 MG tablet  . amiodarone (PACERONE) 200 MG tablet  . aspirin 81 MG chewable tablet  . cefdinir (OMNICEF) 300 MG capsule  . cholecalciferol (VITAMIN D) 1000 units tablet  . ezetimibe-simvastatin (VYTORIN) 10-20 MG per tablet  . furosemide (LASIX) 20 MG tablet  . metoprolol succinate (TOPROL-XL) 50 MG 24 hr tablet  . Multiple Vitamins-Minerals (PRESERVISION AREDS 2 PO)  . polyethylene glycol (MIRALAX / GLYCOLAX) packet  . potassium chloride (K-DUR) 10 MEQ tablet  . Triamcinolone Acetonide (TRIAMCINOLONE 0.1 % CREAM : EUCERIN) CREA    George Hugh [facility] Short Stay Center/Anesthesiology Phone [phone] 08/17/2018 3:05 PM

## 2018-08-17 NOTE — Progress Notes (Signed)
Pt SDW-Pre-op call completed by pt spouse, Lelon Frohlich. Spouse denies any acute cardiopulmonary issues. Spouse stated that pt is under the care of Dr. Sallyanne Kuster, Cardiology. Pt made aware to have pt stop taking vitamins, fish oil and herbal medications. Do not take any NSAIDs ie: Ibuprofen, Advil, Naproxen (Aleve), Motrin, BC and Goody Powder. Spouse verbalized understanding of all pre-op instructions. Anesthesia asked to review pt history.

## 2018-08-17 NOTE — H&P (Signed)
[address]       [place] [address]             [phone]      Cardiothoracic Surgery Admission History and Physical   Referring Provider is Croitoru, Dani Gobble, MD  PCP is Tisovec, Fransico Him, MD      Chief Complaint  Patient presents with  . Aortic Stenosis        HPI:  The patient is an 84 year old gentleman with a history of abdominal aortic aneurysm, chronic systolic congestive heart failure status post ICD placement, ventricular tachycardia, history of sleep apnea, hyperlipidemia, and degenerative arthritis/lumbar vertebral fracture with reduced mobility as well as aortic stenosis. The patient has been followed by Dr. Sallyanne [name]. He was recently admitted with progressive worsening shortness of breath and lower extremity edema over several weeks duration. He was treated with diuresis with significant improvement in his lower extremity edema as well as his breathing. HIs most recent echocardiogram on 07/03/2018 showed a mean gradient across aortic valve of 21 mmHg. Peak gradient was 53 mmHg and dimensionless index was 0.16. Aortic valve area was 0.66 cm. Left ventricular ejection fraction was 20 to 25%. He has had progressive dyspnea with low-level exertion. He is not very active due to a previous fall down the stairs at home about 5 years ago where he broke his back. He does ambulate some in his house using a walker and if he gets out and has to go any distance he uses a wheelchair. He still goes out to eat at restaurants several times per week and still works in his business. He was seen by Dr. Angelena [name] for consideration of transcatheter aortic valve replacement. Cardiac catheterization was performed on 07/06/2018 which showed mild nonobstructive coronary disease. He was seen by me in consultation on 10/22 and felt to be a candidate for TAVR. He was scheduled for TAVR on 08/11/2018 but unfortunately was admitted with urosepsis on 08/07/2018. Urine culture grew  klebsiella and he was treated with antibiotics and fluid. TAVR was postponed. He made a good recovery and was discharged on 08/13/2018 to return for TAVR on 08/18/2018.  He lives in [name] with his wife. He reports progressive exertional dyspnea and fatigue. This can occur with moving around in the house. He denies any chest discomfort. He has had chronic lower extremity edema. This has been improved since he has been on Lasix. He denies any dizziness or syncope. He does have balance problems.      Past Medical History:  Diagnosis Date  . AAA (abdominal aortic aneurysm) ([name])    7/13 3.8cm  . Arthritis    "joints" (01/11/2014)  . Asthma    "seasonal; some foods"   . Automatic implantable cardioverter-defibrillator in situ   . Chronic systolic CHF (congestive heart failure) ([facility])   . Dyslipidemia   . Hemorrhage    Post-colonostomy  . HLD (hyperlipidemia)   . Lumbar vertebral fracture ([facility])    L1- 04/11/2014   . Prostate cancer ([facility])   . S/P ICD (internal cardiac defibrillator) procedure, 01/11/14 removal of ERI gen and placement of Medtronic Evera XT VR & NEW Right ventricular lead Medtronic 01/12/2014  . Sleep apnea    "lost 60# & don't have it anymore" (01/11/2014)  . Ventricular tachycardia [name])         Past Surgical History:  Procedure Laterality Date  . CARDIAC CATHETERIZATION  08/20/2004   noncritical CAD,mild global hypokinesis, EF 50%  . CARDIAC  DEFIBRILLATOR PLACEMENT  08/23/2004   Medtronic  . CATARACT EXTRACTION W/ INTRAOCULAR LENS IMPLANT Right   . COLECTOMY  1990's  . CRYOABLATION N/A 02/24/2014   Procedure: CRYO ABLATION PROSTATE; Surgeon: Ailene Rud, MD; Location: [facility]; Service: Urology; Laterality: N/A;  . HERNIA REPAIR     "abdomen; from colon OR"  . ICD GENERATOR CHANGE  01/11/2014   "upgrade"  . IMPLANTABLE CARDIOVERTER DEFIBRILLATOR (ICD) GENERATOR CHANGE N/A 01/11/2014   Procedure: ICD GENERATOR CHANGE; Surgeon: Sanda Klein, MD; Location: [facility]  [facility]; Service: Cardiovascular; Laterality: N/A;  . LEAD REVISION N/A 01/11/2014   Procedure: LEAD REVISION; Surgeon: Sanda Klein, MD; Location: [facility]; Service: Cardiovascular; Laterality: N/A;  . NM MYOCAR PERF WALL MOTION  01/29/2012   abnormal c/o infarct/scar,no ischemia present  . PROSTATE BIOPSY N/A 11/22/2013   Procedure: PROSTATE BIOPSY AND ULTRASOUND; Surgeon: Ailene Rud, MD; Location: [facility]; Service: Urology; Laterality: N/A;  . RIGHT/LEFT HEART CATH AND CORONARY ANGIOGRAPHY N/A 07/06/2018   Procedure: RIGHT/LEFT HEART CATH AND CORONARY ANGIOGRAPHY; Surgeon: [place] Sine, MD; Location: [facility]; Service: Cardiovascular; Laterality: N/A;        Family History  Problem Relation Age of Onset  . Heart failure Mother    Died of "old age" at 75  . Pneumonia Father    Social History        Socioeconomic History  . Marital status: Married    Spouse name: Not on file  . Number of children: 2  . Years of education: Not on file  . Highest education level: Not on file  Occupational History  . Occupation: Owned a Scientist, [employer]  Social Needs  . Financial resource strain: Not on file  . Food insecurity:    Worry: Not on file    Inability: Not on file  . Transportation needs:    Medical: Not on file    Non-medical: Not on file  Tobacco Use  . Smoking status: Never Smoker  . Smokeless tobacco: Never Used  Substance and Sexual Activity  . Alcohol use: Yes    Alcohol/week: 0.0 standard drinks    Comment: 01/11/2014 "drink 1/2 of a beer twice per month   . Drug use: No  . Sexual activity: Not Currently  Lifestyle  . Physical activity:    Days per week: Not on file    Minutes per session: Not on file  . Stress: Not on file  Relationships  . Social connections:    Talks on phone: Not on file    Gets together: Not on file    Attends religious service: Not on file    Active member of club or organization: Not on file    Attends meetings of  clubs or organizations: Not on file    Relationship status: Not on file  . Intimate partner violence:    Fear of current or ex partner: Not on file    Emotionally abused: Not on file    Physically abused: Not on file    Forced sexual activity: Not on file  Other Topics Concern  . Not on file  Social History Narrative  . Not on file         Current Outpatient Medications  Medication Sig Dispense Refill  . acetaminophen (TYLENOL) 500 MG tablet Take 500 mg by mouth every 6 (six) hours as needed (for pain).     [place] amiodarone (PACERONE) 200 MG tablet TAKE 1/2 TABLET BY MOUTH EVERY DAY (Patient taking differently:  Take 100 mg by mouth daily. ) 45 tablet 0  . aspirin 81 MG chewable tablet Chew 1 tablet (81 mg total) by mouth daily. 90 tablet 3  . cholecalciferol (VITAMIN D) 1000 units tablet Take 2,000 Units by mouth at bedtime.    [place] Kitchen ezetimibe-simvastatin (VYTORIN) 10-20 MG per tablet Take 1 tablet by mouth at bedtime.    . furosemide (LASIX) 20 MG tablet Take 1 tablet (20 mg total) by mouth daily. 90 tablet 3  . metoprolol succinate (TOPROL-XL) 50 MG 24 hr tablet Take 1 tablet (50 mg total) daily by mouth. Take with or immediately following a meal. 90 tablet 3  . Multiple Vitamins-Minerals (PRESERVISION AREDS 2 PO) Take 1 tablet by mouth daily.    . polyethylene glycol (MIRALAX / GLYCOLAX) packet Take 17 g by mouth every evening. 14 each 1  . potassium chloride (K-DUR) 10 MEQ tablet TAKE 1 TABLET BY MOUTH DAILY ALONG WITH LASIX FOR 2 TO 4 DAYS THEN STOP (Patient taking differently: Take 10 mEq by mouth daily. TAKE 1 TABLET BY MOUTH DAILY ALONG WITH LASIX FOR 2 TO 4 DAYS THEN STOP) 30 tablet 6  . Triamcinolone Acetonide (TRIAMCINOLONE 0.1 % CREAM : EUCERIN) CREA Apply 1 application topically as needed for rash or itching.     No current facility-administered medications for this visit.         Allergies  Allergen Reactions  . Crestor [Rosuvastatin] Other (See Comments)    Hurts muscles  .  Lipitor [Atorvastatin] Other (See Comments)    Hurts stomach   Review of Systems:   General: normal appetite, decreased energy, no weight gain, + weight loss with diuresis, no fever  Cardiac: no chest pain with exertion, no chest pain at rest, +SOB with exertion, no resting SOB, no PND, no orthopnea, no palpitations, no arrhythmia, no atrial fibrillation, + LE edema, no dizzy spells, no syncope  Respiratory: + shortness of breath, no home oxygen, no productive cough, no dry cough, no bronchitis, no wheezing, no hemoptysis, no asthma, no pain with inspiration or cough, + sleep apnea, no CPAP at night  GI: no difficulty swallowing, no reflux, no frequent heartburn, no hiatal hernia, no abdominal pain, no constipation, no diarrhea, no hematochezia, no hematemesis, no melena  GU: no dysuria, no frequency, + urinary tract infection, no hematuria, no enlarged prostate, no kidney stones, no kidney disease  Vascular: no pain suggestive of claudication, no pain in feet, no leg cramps, + varicose veins, no DVT, no non-healing foot ulcer  Neuro: no stroke, no TIA's, no seizures, no headaches, no temporary blindness one eye, no slurred speech, no peripheral neuropathy, no chronic pain, + instability of gait, + memory/cognitive dysfunction  Musculoskeletal: + arthritis, no joint swelling, no myalgias, + difficulty walking, reduced mobility and uses walker  Skin: no rash, + itching, no skin infections, no pressure sores or ulcerations  Psych: no anxiety, no depression, no nervousness, no unusual recent stress  Eyes: no blurry vision, no floaters, no recent vision changes, + wears glasses  ENT: + hearing loss, bi loose or painful teeth, bi dentures, last saw dentist recently  Hematologic: + easy bruising, no abnormal bleeding, no clotting disorder, no frequent epistaxis  Endocrine: no diabetes, does not check CBG's at home    Physical Exam:   BP (!) 104/54 (BP Location: Left Arm, Patient Position: Sitting,  Cuff Size: Large)  Pulse (!) 54 Comment: irreg  Resp 16  Ht 5\' 10"  (1.778 m)  Wt 220 lb (99.8  kg)  SpO2 98% Comment: RA  BMI 31.57 kg/m  General: Elderly, chronically ill-appearing  HEENT: Unremarkable, NCAT, PERLA, EOMI, oropharynx clear  Neck: no JVD, no bruits, no adenopathy or thyromegaly  Chest: clear to auscultation, symmetrical breath sounds, no wheezes, no rhonchi  CV: RRR, grade III/VI crescendo/decrescendo murmur heard best at RSB, no diastolic murmur  Abdomen: soft, non-tender, no masses  Extremities: warm, well-perfused, pulses not palpable, 2+ LE edema bilaterally.  Rectal/GU Deferred  Neuro: Grossly non-focal and symmetrical throughout  Skin: Clean and dry, no rashes, no breakdown    Diagnostic Tests:   *[place]*  *[facility]*  [address], [place] [address]  [phone]  -------------------------------------------------------------------  Transthoracic Echocardiography  Patient: [name], [name]  MR #: [id number]  Study Date: 07/03/2018  Gender: M  Age: 32  Height: 177.8 cm  Weight: 105.4 kg  BSA: 2.32 m^2  Pt. Status:  Room: 3E26C  SONOGRAPHER Merrie Roof, RDCS  PERFORMING [facility], Inpatient  ADMITTING Skeet Latch, MD  [place], MD  Shon Hale  REFERRING Rob Hickman Tami Lin  cc:  -------------------------------------------------------------------  LV EF: 20% - 25%  -------------------------------------------------------------------  Indications: CHF - 428.0.  -------------------------------------------------------------------  History: PMH: NICM. Nonobstructive CAD. VT.  -------------------------------------------------------------------  Study Conclusions  - Left ventricle: The cavity size was normal. Wall thickness was  increased in a pattern of moderate LVH. Systolic function was  severely reduced. The estimated ejection fraction was in the  range of 20% to 25%. Diffuse  hypokinesis. Doppler parameters are  consistent with abnormal left ventricular relaxation (grade 1  diastolic dysfunction). Doppler parameters are consistent with  high ventricular filling pressure.  - Aortic valve: Valve mobility was restricted. There was severe  stenosis. Valve area (VTI): 0.76 cm^2. Valve area (Vmax): 0.56  cm^2. Valve area (Vmean): 0.66 cm^2.  - Aortic root: The aortic root was mildly dilated.  - Mitral valve: Calcified annulus. Mildly thickened leaflets .  There was mild regurgitation.  - Left atrium: The atrium was severely dilated.  - Right atrium: The atrium was mildly dilated.  - Pulmonary arteries: Systolic pressure was mildly increased. PA  peak pressure: 36 mm Hg (S).  Impressions:  - Severe global reduction in LV systolic function; mild diastolic  dysfunction; moderate LVH; mildly dilated aortic root; severe AS;  mild MR; biatrial enlargement; mild TR with mildly elevated  pulmonary pressure.  -------------------------------------------------------------------  Study data: Comparison was made to the study of 08/20/2004. Study  status: Routine. Procedure: The patient reported no pain pre or  post test. Transthoracic echocardiography. Image quality was  adequate. Transthoracic echocardiography. M-mode,  complete 2D, spectral Doppler, and color Doppler. Birthdate:  Patient birthdate: 04/22/1928. Age: Patient is 84 yr old. Sex:  Gender: male. BMI: 33.3 kg/m^2. Blood pressure: 107/67  Patient status: Inpatient. Study date: Study date: 07/03/2018.  Study time: 12:12 PM. Location: Bedside.  -------------------------------------------------------------------  -------------------------------------------------------------------  Left ventricle: The cavity size was normal. Wall thickness was  increased in a pattern of moderate LVH. Systolic function was  severely reduced. The estimated ejection fraction was in the range  of 20% to 25%. Diffuse hypokinesis. Doppler  parameters are  consistent with abnormal left ventricular relaxation (grade 1  diastolic dysfunction). Doppler parameters are consistent with high  ventricular filling pressure.  -------------------------------------------------------------------  Aortic valve: Trileaflet; moderately calcified leaflets. Valve  mobility was restricted. Doppler: There was severe stenosis.  There was no regurgitation. VTI ratio of LVOT to aortic valve:  0.18.  Valve area (VTI): 0.76 cm^2. Indexed valve area (VTI): 0.33  cm^2/m^2. Peak velocity ratio of LVOT to aortic valve: 0.14. Valve  area (Vmax): 0.56 cm^2. Indexed valve area (Vmax): 0.24 cm^2/m^2.  Mean velocity ratio of LVOT to aortic valve: 0.16. Valve area  (Vmean): 0.66 cm^2. Indexed valve area (Vmean): 0.29 cm^2/m^2.  Mean gradient (S): 21 mm Hg. Peak gradient (S): 53 mm Hg.  -------------------------------------------------------------------  Aorta: Aortic root: The aortic root was mildly dilated.  -------------------------------------------------------------------  Mitral valve: Calcified annulus. Mildly thickened leaflets .  Mobility was not restricted. Doppler: Transvalvular velocity was  within the normal range. There was no evidence for stenosis. There  was mild regurgitation. Peak gradient (D): 3 mm Hg.  -------------------------------------------------------------------  Left atrium: The atrium was severely dilated.  -------------------------------------------------------------------  Right ventricle: The cavity size was normal. Pacer wire or  catheter noted in right ventricle. Systolic function was normal.  -------------------------------------------------------------------  Pulmonic valve: Doppler: Transvalvular velocity was within the  normal range. There was no evidence for stenosis. There was mild  regurgitation.  -------------------------------------------------------------------  Tricuspid valve: Structurally normal valve. Doppler:    Transvalvular velocity was within the normal range. There was mild  regurgitation.  -------------------------------------------------------------------  Pulmonary artery: Systolic pressure was mildly increased.  -------------------------------------------------------------------  Right atrium: The atrium was mildly dilated. Pacer wire or  catheter noted in right atrium.  -------------------------------------------------------------------  Pericardium: There was no pericardial effusion.  -------------------------------------------------------------------  Systemic veins:  Inferior vena cava: The vessel was normal in size.  -------------------------------------------------------------------  Measurements  Left ventricle Value Reference  LV ID, ED, PLAX chordal (H) 52.5 mm 43 - 52  LV ID, ES, PLAX chordal (H) 40.3 mm 23 - 38  LV fx shortening, PLAX chordal (L) 23 % >=29  LV PW thickness, ED 13.76 mm ----------  IVS/LV PW ratio, ED 1.06 <=1.3  Stroke volume, 2D 46 ml ----------  Stroke volume/bsa, 2D 20 ml/m^2 ----------  LV e&', lateral 7.76 cm/s ----------  LV E/e&', lateral 10.44 ----------  LV e&', medial 3.37 cm/s ----------  LV E/e&', medial 24.04 ----------  LV e&', average 5.57 cm/s ----------  LV E/e&', average 14.56 ----------  Longitudinal strain, TDI 4 % ----------  Ventricular septum Value Reference  IVS thickness, ED 14.54 mm ----------  LVOT Value Reference  LVOT ID, S 23 mm ----------  LVOT area 4.15 cm^2 ----------  LVOT peak velocity, S 49.4 cm/s ----------  LVOT mean velocity, S 33.8 cm/s ----------  LVOT VTI, S 11.1 cm ----------  LVOT peak gradient, S 1 mm Hg ----------  Aortic valve Value Reference  Aortic valve peak velocity, S 363 cm/s ----------  Aortic valve mean velocity, S 212 cm/s ----------  Aortic valve VTI, S 60.4 cm ----------  Aortic mean gradient, S 21 mm Hg ----------  Aortic peak gradient, S 53 mm Hg ----------  VTI ratio, LVOT/AV 0.18  ----------  Aortic valve area, VTI 0.76 cm^2 ----------  Aortic valve area/bsa, VTI 0.33 cm^2/m^2 ----------  Velocity ratio, peak, LVOT/AV 0.14 ----------  Aortic valve area, peak velocity 0.56 cm^2 ----------  Aortic valve area/bsa, peak 0.24 cm^2/m^2 ----------  velocity  Velocity ratio, mean, LVOT/AV 0.16 ----------  Aortic valve area, mean velocity 0.66 cm^2 ----------  Aortic valve area/bsa, mean 0.29 cm^2/m^2 ----------  velocity  Aorta Value Reference  Aortic root ID, ED 38 mm ----------  Left atrium Value Reference  LA ID, A-P, ES 54 mm ----------  LA ID/bsa, A-P (H) 2.33 cm/m^2 <=2.2  LA volume, S 149 ml ----------  LA volume/bsa, S 64.3 ml/m^2 ----------  LA volume, ES, 1-p A4C 146 ml ----------  LA volume/bsa, ES, 1-p A4C 63 ml/m^2 ----------  LA volume, ES, 1-p A2C 142 ml ----------  LA volume/bsa, ES, 1-p A2C 61.3 ml/m^2 ----------  Mitral valve Value Reference  Mitral E-wave peak velocity 81 cm/s ----------  Mitral A-wave peak velocity 81.5 cm/s ----------  Mitral deceleration time (L) 120 ms 150 - 230  Mitral peak gradient, D 3 mm Hg ----------  Mitral E/A ratio, peak 1 ----------  Pulmonary arteries Value Reference  PA pressure, S, DP (H) 36 mm Hg <=30  Tricuspid valve Value Reference  Tricuspid regurg peak velocity 286 cm/s ----------  Tricuspid peak RV-RA gradient 33 mm Hg ----------  Right atrium Value Reference  RA ID, S-I, ES, A4C (H) 74.1 mm 34 - 49  RA area, ES, A4C (H) 30.5 cm^2 8.3 - 19.5  RA volume, ES, A/L 101 ml ----------  RA volume/bsa, ES, A/L 43.6 ml/m^2 ----------  Systemic veins Value Reference  Estimated CVP 3 mm Hg ----------  Right ventricle Value Reference  RV ID, minor axis, ED, A4C base 37.7 mm ----------  TAPSE 25.3 mm ----------  RV pressure, S, DP (H) 36 mm Hg <=30  RV s&', lateral, S 10.4 cm/s ----------  Legend:  (L) and (H) mark values outside specified reference range.    -------------------------------------------------------------------  Prepared and Electronically Authenticated by  Kirk Ruths  2019-09-20T14:00:47    Physicians  Panel Physicians Referring Physician Case Authorizing Physician  [place] [facility]ine, MD (Primary)    Procedures  RIGHT/LEFT HEART CATH AND CORONARY ANGIOGRAPHY  Conclusion  Dist RCA lesion is 20% stenosed.  Mid LM lesion is 20% stenosed.  Ost LAD lesion is 20% stenosed.  Prox LAD lesion is 30% stenosed.  Mid LAD lesion is 30% stenosed.  Dist Cx lesion is 50% stenosed.  Ost Cx to Prox Cx lesion is 20% stenosed.  Mid Cx lesion is 30% stenosed. Significant coronary calcification involving all coronary arteries with 20% smooth distal left main stenosis; 20-30% proximal and mid LAD stenoses; 20 and 30% proximal and mid AV groove circumflex stenosis with 50% distal stenosis; and significant calcification of the RCA with mild luminal irregularity and 20% narrowing.  Upper normal right heart pressures.  Severe low gradient aortic valve stenosis in this patient with echocardiographic documentation of an ejection fraction of 20 to 25%. The peak to peak gradient is 33 mm with a mean gradient of 26 mm and aortic valve area of 0.7 to 0.9 cm2.  Supravalvular aortography demonstrates calcified aortic valve with severely reduced excursion. The aortic root is mildly dilated.  RECOMMENDATION:  Mr. Preston Garabedian has severe low gradient aortic valve stenosis which may be contributing to his severe LV dysfunction. There is significant coronary calcification without high-grade obstructive stenoses. Depending upon functional status, consider possible surgical evaluation for consideration of aortic valve replacement.  Recommend Aspirin 81mg  daily for moderate CAD.  Indications  Nonrheumatic aortic valve stenosis [I35.0 (ICD-10-CM)]  Dilated cardiomyopathy ([place]) [I42.0 (ICD-10-CM)]  Procedural Details/Technique  Technical Details Mr. Antonio Creswell is an 84 year old Caucasian male who had recently developed increasing volume overload and has systolic and diastolic function. An echo Doppler study has shown an EF of 20 to 25% and there was evidence for severe low gradient aortic valve stenosis with a mean gradient of 21, peak gradient 53, and estimated valve area of 0.76 cm. The patient is referred for right and left heart cardiac catheterization.  The patient arrived to the catheterization laboratory in the fasting state. He was premedicated with Versed 0.5 mg. Due to his severe AS and previous documentation of dilated aortic root the decision was made to perform right and left heart catheterization via the femoral approach. Ultrasound guidance was used, a picture was taken and placed in the chart. His right femoral artery was punctured anteriorly and a 6 French arterial sheath and 7 French venous sheath were inserted without difficulty. A Swan-Ganz catheter was advanced via the venous sheath and pressures were recorded in the RA, RV, PA, and wedge positions. Oxygen saturation was obtained in the pulmonary artery is also obtained in the central aorta once the catheter was placed. Cardiac outputs were determined by the Fick method as well as the thermodilution methods. A 5 [place] JL 5 diagnostic catheter was ultimately used for selective angiography in the left coronary system. A JR4 catheter was used for selective angiography in the RCA. With a straight wire, ultimately the RCA catheter was able to be advanced into the left ventricle across the aortic stenosis. Initial simultaneous FA and LV pressures were recorded but the FA pressure was dampened. Simultaneous after meals and LV pressures were recorded. A careful LV to AO aortic pullback was performed. Supravalvular aortography was performed to further evaluate valve mobility and his aortic root. All catheters were removed from the patient. Hemostasis was obtained by direct manual pressure. She  tolerated the procedure well and returned to his room in satisfactory condition.   Estimated blood loss <50 mL.  During this procedure the patient was administered the following to achieve and maintain moderate conscious sedation: Versed 0.5 mg, while the patient's heart rate, blood pressure, and oxygen saturation were continuously monitored. The period of conscious sedation was 49 minutes, of which I was present face-to-face 100% of this time.  Coronary Findings  Diagnostic  Dominance: Right  Left Main  Mid LM lesion 20% stenosed  Mid LM lesion is 20% stenosed.  Left Anterior Descending  Ost LAD lesion 20% stenosed  Ost LAD lesion is 20% stenosed.  Prox LAD lesion 30% stenosed  Prox LAD lesion is 30% stenosed.  Mid LAD lesion 30% stenosed  Mid LAD lesion is 30% stenosed.  Left Circumflex  Ost Cx to Prox Cx lesion 20% stenosed  Ost Cx to Prox Cx lesion is 20% stenosed.  Mid Cx lesion 30% stenosed  Mid Cx lesion is 30% stenosed.  Dist Cx lesion 50% stenosed  Dist Cx lesion is 50% stenosed.  Right Coronary Artery  There is mild diffuse disease throughout the vessel.  Dist RCA lesion 20% stenosed  Dist RCA lesion is 20% stenosed.  Intervention  No interventions have been documented.  Right Heart  Right Heart Pressures RA: a wave 5, v wave 4; mean 3 RV: 28/5 PA: 26/14; mean 19 PW: A-wave 19, V wave 17; mean 14  Initial aortic pressure 98/67.  PW: Mean 21 LV: 128/22  Pullback: LV 129/21 AO: 96/63; mean 78  Oxygen saturation in the PA 65% and in the LV 95%.  Cardiac output by the Fick method 5.1 and by the thermodilution method 4.3 liters per minute. Cardiac index by the Fick method 2.4 and by the thermodilution method 2.0 L/min/m.  Aortic valve: Peak to peak gradient 33; mean gradient 26. Aortic valve area 0.74 by the thermodilution method and 0.89 by the Fick method.  Coronary Diagrams  Diagnostic Diagram     Implants     No implant documentation for  this case.   MERGE Images  Link to Procedure Log   Show images for CARDIAC CATHETERIZATION Procedure Log  Hemo Data   Most Recent Value  Fick Cardiac Output 5.14 L/min  Fick Cardiac Output Index 2.36 (L/min)/BSA  Thermal Cardiac Output 4.31 L/min  Thermal Cardiac Output Index 1.98 (L/min)/BSA  Aortic Mean Gradient 25.5 mmHg  Aortic Peak Gradient 33 mmHg  Aortic Valve Area 0.74  Aortic Value Area Index 0.34 cm2/BSA  RA A Wave 5 mmHg  RA V Wave 4 mmHg  RA Mean 3 mmHg  RV Systolic Pressure 28 mmHg  RV Diastolic Pressure 0 mmHg  RV EDP 5 mmHg  PA Systolic Pressure 26 mmHg  PA Diastolic Pressure 14 mmHg  PA Mean 19 mmHg  PW A Wave 22 mmHg  PW V Wave 21 mmHg  PW Mean 18 mmHg  AO Systolic Pressure 98 mmHg  AO Diastolic Pressure 67 mmHg  AO Mean 82 mmHg  LV Systolic Pressure 696 mmHg  LV Diastolic Pressure 10 mmHg  LV EDP 21 mmHg  AOp Systolic Pressure 96 mmHg  AOp Diastolic Pressure 63 mmHg  AOp Mean Pressure 78 mmHg  LVp Systolic Pressure 295 mmHg  LVp Diastolic Pressure 6 mmHg  LVp EDP Pressure 21 mmHg  TPVR Index 9.6 HRUI    ADDENDUM REPORT: 08/03/2018 16:16  CLINICAL DATA: Aortic stenosis  EXAM:  Cardiac TAVR CT  TECHNIQUE:  The patient was scanned on a Siemens Force 284 slice scanner. A 120  kV retrospective scan was triggered in the descending thoracic aorta  at 111 HU's. Gantry rotation speed was 270 msecs and collimation was  .9 mm. No beta blockade or nitro were given. The 3D data set was  reconstructed in 5% intervals of the R-R cycle. Systolic and  diastolic phases were analyzed on a dedicated work station using  MPR, MIP and VRT modes. The patient received 80 cc of contrast.  FINDINGS:  Aortic Valve: Trileaflet calcified with restricted leaflet motion  Aorta: Moderate calcific atherosclerosis. The is a large?  Penetrating ulcer or bulky plaque in the descending thoracic aorta  just distal to the left subclavian take off that would likely  preclude transfemoral  approach  Sinotubular Junction: 31 mm  Ascending Thoracic Aorta: 35 mm  Aortic Arch: 33 mm  Descending Thoracic Aorta: 30 mm  Sinus of Valsalva Measurements:  Non-coronary: 35 mm  Right - coronary: 31 mm  Left - coronary: 33 mm  Coronary Artery Height above Annulus:  Left Main: 12.6 mm above annulus  Right Coronary: 17 mm above annulus  Virtual Basal Annulus Measurements:  Maximum/Minimum Diameter: 27.7 mm x 31.1 mm  Perimeter: 93 mm  Area: 699 mm2  Coronary Arteries: Sufficient height for deployment  Optimum Fluoroscopic Angle for Delivery: LAO 2 Caudal 10 degrees  IMPRESSION:  1. Trileaflet AV with annular area 699 mm2 which is upper normal for  29 mm Sapien 3 or suitable for 34 mm Evolute R  2. Optimum angiographic angle for deployment RAO 2 degrees Caudal 10  degrees  3. Coronary arteries sufficient height above annulus for deployment  4. Lage bulky mixed plaque/ penetrating ulcer in aorta just distal  to take off of left subclavian that would likely preclude trans  femoral approach with high embolic risk  Jenkins Rouge  Electronically Signed  By: Jenkins Rouge M.D.  On: 08/03/2018 16:16  CLINICAL DATA: 84 year old male with history of severe aortic  stenosis. Preprocedural study for potential transcatheter aortic  valve replacement (TAVR) procedure.  EXAM:  CT ANGIOGRAPHY CHEST, ABDOMEN AND PELVIS  TECHNIQUE:  Multidetector CT imaging through the chest, abdomen and pelvis was  performed using the standard protocol during bolus administration of  intravenous contrast. Multiplanar reconstructed images and MIPs were  obtained and reviewed to evaluate the vascular anatomy.  CONTRAST: 167mL ISOVUE-370 IOPAMIDOL (ISOVUE-370) INJECTION 76%  COMPARISON: CT the abdomen and pelvis 01/23/2015. No prior chest  CT.  FINDINGS:  CTA CHEST FINDINGS  Cardiovascular: Heart size is enlarged with left atrial dilatation.  There is no significant pericardial fluid, thickening or  pericardial  calcification. There is aortic atherosclerosis, as well as  atherosclerosis of the great vessels of the mediastinum and the  coronary arteries, including calcified atherosclerotic plaque in the  left main, left anterior descending, left circumflex and right  coronary arteries. Severe thickening calcification of the aortic  valve. Thickening calcification of the mitral valve. Left-sided  pacemaker/AICD with leads terminating in the apex of the right  ventricle.  Mediastinum/Lymph Nodes: No pathologically enlarged mediastinal or  hilar lymph nodes. Esophagus is unremarkable in appearance. No  axillary lymphadenopathy.  Lungs/Pleura: Several small pulmonary nodules are noted in the lungs  bilaterally, the largest of which measures 6 mm in the periphery of  the left lower lobe (axial image 51 of series 15). No larger more  suspicious appearing pulmonary nodules or masses are noted. No acute  consolidative airspace disease. No pleural effusions. Areas of  scarring are noted in the lung bases bilaterally.  Musculoskeletal/Soft Tissues: There are no aggressive appearing  lytic or blastic lesions noted in the visualized portions of the  skeleton.  CTA ABDOMEN AND PELVIS FINDINGS  Hepatobiliary: Diffuse low attenuation throughout the hepatic  parenchyma, indicative of hepatic steatosis. No suspicious cystic or  solid hepatic lesions. No intra or extrahepatic biliary ductal  dilatation. Gallbladder lumen is filled with innumerable tiny  calcified gallstones. No surrounding inflammatory changes to suggest  an acute cholecystitis at this time.  Pancreas: No pancreatic mass. Multifocal pancreatic ductal ectasia.  Diffuse atrophy throughout the body and tail of the pancreas.  Several small low-attenuation lesions throughout the body of the  pancreas, largest of which measures only 1.4 x 0.8 cm (axial image  98 of series 14), minimally increased in retrospect to prior study  from  01/23/2015. No peripancreatic fluid collections or inflammatory  changes.  Spleen: Unremarkable.  Adrenals/Urinary Tract: Multiple low-attenuation nonenhancing  lesions in both kidneys, compatible with simple cysts, measuring up  to 4.5 cm in the upper pole of the left kidney. Bilateral adrenal  glands are normal in appearance. No hydroureteronephrosis. Urinary  bladder is largely decompressed. Urinary bladder wall appears  thickened (this could be accentuated by decompression).  Stomach/Bowel: Normal appearance of the stomach. No pathologic  dilatation of small bowel or colon. Numerous colonic diverticulae  are noted, particularly in the sigmoid colon, without surrounding  inflammatory changes to suggest an acute diverticulitis at this  time. Postoperative changes of right hemicolectomy.  Vascular/Lymphatic: Aortic atherosclerosis, with fusiform infrarenal  abdominal aortic aneurysm measuring up to 4.4 x 4.4 cm in diameter.  No dissection noted in the abdominal or pelvic vasculature. Vascular  findings and measurements pertinent to potential TAVR procedure, as  detailed below. No pathologically enlarged lymph nodes are noted in  the abdomen or pelvis.  Reproductive: Prostate gland and seminal vesicles are unremarkable  in appearance.  Other: No significant volume of ascites. No pneumoperitoneum.  Musculoskeletal: Chronic appearing compression fracture of L1 with  90% loss of  anterior vertebral body height. There are no aggressive  appearing lytic or blastic lesions noted in the visualized portions  of the skeleton.  VASCULAR MEASUREMENTS PERTINENT TO TAVR:  AORTA:  Minimal Aortic Diameter-26 x 24 mm  Severity of Aortic Calcification-severe  RIGHT PELVIS:  Right Common Iliac Artery -  Minimal Diameter-14.6 x 11.3 mm  Tortuosity-moderate  Calcification-moderate to severe  Right External Iliac Artery -  Minimal Diameter-10.7 x 10.0 mm  Tortuosity-severe  Calcification-mild    Right Common Femoral Artery -  Minimal Diameter-10.3 x 10.4 mm  Tortuosity-mild  Calcification-mild-to-moderate  LEFT PELVIS:  Left Common Iliac Artery -  Minimal Diameter-14.1 x 12.4 mm  Tortuosity-moderate  Calcification-moderate to severe  Left External Iliac Artery -  Minimal Diameter-10.7 x 9.4 mm  Tortuosity-moderate to severe  Calcification-mild  Left Common Femoral Artery -  Minimal Diameter-11.0 x 11.0 mm  Tortuosity-mild  Calcification-moderate  Review of the MIP images confirms the above findings.  IMPRESSION:  1. Vascular findings and measurements pertinent to potential TAVR  procedure, as detailed above.  2. Severe thickening calcification of the aortic valve, compatible  with reported clinical history of severe aortic stenosis.  3. Multiple small pulmonary nodules noted throughout the lungs  bilaterally, largest of which measures only 6 mm in the periphery of  the left lower lobe (axial image 51 of series 15). Non-contrast  chest CT at 6 months is recommended. If the nodules are stable at  time of repeat CT, then future CT at 18-24 months (from today's  scan) is considered optional for low-risk patients, but is  recommended for high-risk patients. This recommendation follows the  consensus statement: Guidelines for Management of Incidental  Pulmonary Nodules Detected on CT Images: From the Fleischner Society  2017; Radiology 2017; 284:228-243.  4. Several small low-attenuation lesions are noted in the body of  the pancreas, largest of which measures only 1.4 x 0.8 cm,  relatively similar to remote prior study from 2016. These are  statistically likely benign given the patient's age and their  behavior. If further evaluation is desired, repeat pancreatic  protocol CT could be considered in 2 years to confirm continued  stability. This recommendation follows ACR consensus guidelines:  Management of Incidental Pancreatic Cysts: A White Paper of the ACR   Incidental Findings Committee. J Am Coll Radiol 3300;76:226-333.  5. Aortic atherosclerosis, in addition to left main and 3 vessel  coronary artery disease. In addition, there is fusiform aneurysmal  dilatation of the infrarenal abdominal aorta which measures up to  4.4 cm in diameter. Recommend followup by ultrasound in 1 year. This  recommendation follows ACR consensus guidelines: White Paper of the  ACR Incidental Findings Committee II on Vascular Findings. J Am Coll  Radiol 2013; 10:789-794.  6. Cholelithiasis without evidence of acute cholecystitis at this  time.  7. Circumferential thickening of the urinary bladder wall. Although  this could simply be related to under distention of the urinary  bladder, even accounting for the decompression of the urinary  bladder, the wall appears diffusely thickened. Outpatient referral  to Urology could be considered for further evaluation if clinically  appropriate.  8. Colonic diverticulosis without evidence of acute diverticulitis  at this time.  9. Additional incidental findings, as above.  Electronically Signed  By: Vinnie Langton M.D.  On: 08/03/2018 17:00    STS Risk Score:  Procedure: Isolated AVR  Risk of Mortality: 3.882%  Renal Failure: 3.488%  Permanent Stroke: 1.593%  Prolonged Ventilation: 13.968%  DSW Infection:  0.180%  Reoperation: 4.329%  Morbidity or Mortality: 19.540%  Short Length of Stay: 20.095%  Long Length of Stay: 11.254%    Impression:   This 84 year old gentleman has stage D, severe, symptomatic aortic stenosis with New York Heart Association class III symptoms of exertional fatigue and shortness of breath as well as severe left ventricular systolic dysfunction with ejection fraction of 20 to 25% consistent with chronic combined systolic and diastolic congestive heart failure. I have personally reviewed his echocardiogram, cardiac catheterization, and CTA studies. His echocardiogram shows a trileaflet  aortic valve with moderately calcified leaflets and restricted mobility. The mean gradient is 21 mmHg with a dimensionless index of 0.14 with a left ventricular ejection fraction of 20 to 25% with diffuse hypokinesis and a low stroke volume index of 20 cc/m consistent with low gradient, low ejection fraction severe aortic stenosis. His cardiac catheterization shows nonobstructive coronary disease. The mean gradient across aortic valve was 26 mmHg with a peak to peak gradient of 33 mmHg and an aortic valve area of 0.74 by thermodilution and 0.89 by Fick. I think the best treatment for [name] is transcatheter aortic valve replacement. His gated cardiac CTA shows anatomy suitable for transcatheter aortic valve replacement using a 29 mm Sapien 3 valve. There is bulky mixed plaque or penetrating ulcer in the distal aortic arch just beyond the takeoff of the left subclavian artery which is located on the lesser curve of the aorta. I think the risk of embolism is still relatively low since this is on the lesser curve and the wire and valve will be on the greater curve. His abdominal and pelvic CTA shows pelvic vascular anatomy suitable for transfemoral insertion.   The patient and his wife and daughter were counseled at length regarding treatment alternatives for management of severe symptomatic aortic stenosis. The risks and benefits of surgical intervention has been discussed in detail. Long-term prognosis with medical therapy was discussed. Alternative approaches such as conventional surgical aortic valve replacement, transcatheter aortic valve replacement, and palliative medical therapy were compared and contrasted at length. This discussion was placed in the context of the patient's own specific clinical presentation and past medical history. All of their questions have been addressed.   Following the decision to proceed with transcatheter aortic valve replacement, a discussion was held regarding what types of  management strategies would be attempted intraoperatively in the event of life-threatening complications, including whether or not the patient would be considered a candidate for the use of cardiopulmonary bypass and/or conversion to open sternotomy for attempted surgical intervention. I do not think he would be a candidate for median sternotomy under any circumstances.   The patient has been advised of a variety of complications that might develop including but not limited to risks of death, stroke, paravalvular leak, aortic dissection or other major vascular complications, aortic annulus rupture, device embolization, cardiac rupture or perforation, mitral regurgitation, acute myocardial infarction, arrhythmia, heart block or bradycardia requiring permanent pacemaker placement, congestive heart failure, respiratory failure, renal failure, pneumonia, infection, other late complications related to structural valve deterioration or migration, or other complications that might ultimately cause a temporary or permanent loss of functional independence or other long term morbidity. The patient provides full informed consent for the procedure as described and all questions were answered.   Plan:   Transfemoral transcatheter aortic valve replacement.    Gaye Pollack, MD

## 2018-08-17 NOTE — Anesthesia Preprocedure Evaluation (Addendum)
Anesthesia Evaluation  Patient identified by MRN, date of birth, ID band Patient confused    Reviewed: Allergy & Precautions, NPO status , Patient's Chart, lab work & pertinent test results  History of Anesthesia Complications Negative for: history of anesthetic complications  Airway Mallampati: II  TM Distance: <3 FB Neck ROM: Full    Dental  (+) Dental Advisory Given   Pulmonary sleep apnea ,    Pulmonary exam normal        Cardiovascular + CAD and +CHF  Normal cardiovascular exam+ Cardiac Defibrillator + Valvular Problems/Murmurs AS   Echo 07/03/18: Study Conclusions - Left ventricle: The cavity size was normal. Wall thickness was increased in a pattern of moderate LVH. Systolic function was severely reduced. The estimated ejection fraction was in the range of 20% to 25%. Diffuse hypokinesis. Doppler parameters are consistent with abnormal left ventricular relaxation (grade 1 diastolic dysfunction). Doppler parameters are consistent with high ventricular filling pressure. - Aortic valve: Valve mobility was restricted. There was severe stenosis. Valve area (VTI): 0.76 cm^2. Valve area (Vmax): 0.56 cm^2. Valve area (Vmean): 0.66 cm^2. - Aortic root: The aortic root was mildly dilated. - Mitral valve: Calcified annulus. Mildly thickened leaflets . There was mild regurgitation. - Left atrium: The atrium was severely dilated. - Right atrium: The atrium was mildly dilated. - Pulmonary arteries: Systolic pressure was mildly increased. PA peak pressure: 36 mm Hg (S). Impressions: - Severe global reduction in LV systolic function; mild diastolic dysfunction; moderate LVH; mildly dilated aortic root; severe AS; mild MR; biatrial enlargement; mild TR with mildly elevated pulmonary pressure.   Neuro/Psych PSYCHIATRIC DISORDERS Dementia negative neurological ROS     GI/Hepatic negative GI ROS, Neg  liver ROS,   Endo/Other  negative endocrine ROS  Renal/GU negative Renal ROS     Musculoskeletal negative musculoskeletal ROS (+)   Abdominal   Peds  Hematology negative hematology ROS (+)   Anesthesia Other Findings Day of surgery medications reviewed with the patient.  Reproductive/Obstetrics                            Anesthesia Physical Anesthesia Plan  ASA: IV  Anesthesia Plan: MAC   Post-op Pain Management:    Induction: Intravenous  PONV Risk Score and Plan: 2 and Ondansetron and Dexamethasone  Airway Management Planned: Natural Airway and Simple Face Mask  Additional Equipment: Arterial line and CVP  Intra-op Plan:   Post-operative Plan:   Informed Consent: I have reviewed the patients History and Physical, chart, labs and discussed the procedure including the risks, benefits and alternatives for the proposed anesthesia with the patient or authorized representative who has indicated his/her understanding and acceptance.   Dental advisory given  Plan Discussed with: CRNA, Anesthesiologist and Surgeon  Anesthesia Plan Comments:        Anesthesia Quick Evaluation

## 2018-08-17 NOTE — Progress Notes (Signed)
Spoke with William [name] at [employer] to make her aware that  " procedure will likely interfere with device function. Device should be programmed at Tachy therapies disabled " per Dr. Sallyanne Kuster. Email was sent as well.

## 2018-08-18 ENCOUNTER — Inpatient Hospital Stay (HOSPITAL_COMMUNITY): Payer: Medicare Other | Admitting: Vascular Surgery

## 2018-08-18 ENCOUNTER — Inpatient Hospital Stay (HOSPITAL_COMMUNITY): Payer: Medicare Other

## 2018-08-18 ENCOUNTER — Inpatient Hospital Stay (HOSPITAL_COMMUNITY)
Admission: RE | Admit: 2018-08-18 | Discharge: 2018-08-20 | DRG: 266 | Disposition: A | Discharge status: Home Health | Payer: Medicare Other | Source: Ambulatory Visit | Attending: Surgery | Admitting: Surgery

## 2018-08-18 ENCOUNTER — Other Ambulatory Visit: Payer: Self-pay

## 2018-08-18 ENCOUNTER — Encounter (HOSPITAL_COMMUNITY)
Admission: RE | Disposition: A | Discharge status: Home Health | Payer: Self-pay | Source: Ambulatory Visit | Attending: Surgery

## 2018-08-18 ENCOUNTER — Encounter (HOSPITAL_COMMUNITY): Payer: Self-pay

## 2018-08-18 DIAGNOSIS — Z95810 Presence of automatic (implantable) cardiac defibrillator: Secondary | ICD-10-CM | POA: Diagnosis not present

## 2018-08-18 DIAGNOSIS — Z954 Presence of other heart-valve replacement: Secondary | ICD-10-CM | POA: Diagnosis not present

## 2018-08-18 DIAGNOSIS — I428 Other cardiomyopathies: Secondary | ICD-10-CM

## 2018-08-18 DIAGNOSIS — C61 Malignant neoplasm of prostate: Secondary | ICD-10-CM | POA: Diagnosis not present

## 2018-08-18 DIAGNOSIS — Z006 Encounter for examination for normal comparison and control in clinical research program: Secondary | ICD-10-CM | POA: Diagnosis not present

## 2018-08-18 DIAGNOSIS — F0390 Unspecified dementia without behavioral disturbance: Secondary | ICD-10-CM | POA: Diagnosis present

## 2018-08-18 DIAGNOSIS — I2510 Atherosclerotic heart disease of native coronary artery without angina pectoris: Secondary | ICD-10-CM | POA: Diagnosis present

## 2018-08-18 DIAGNOSIS — Z9841 Cataract extraction status, right eye: Secondary | ICD-10-CM | POA: Diagnosis not present

## 2018-08-18 DIAGNOSIS — M47816 Spondylosis without myelopathy or radiculopathy, lumbar region: Secondary | ICD-10-CM | POA: Diagnosis not present

## 2018-08-18 DIAGNOSIS — I350 Nonrheumatic aortic (valve) stenosis: Secondary | ICD-10-CM

## 2018-08-18 DIAGNOSIS — Z8679 Personal history of other diseases of the circulatory system: Secondary | ICD-10-CM | POA: Diagnosis not present

## 2018-08-18 DIAGNOSIS — E785 Hyperlipidemia, unspecified: Secondary | ICD-10-CM | POA: Diagnosis present

## 2018-08-18 DIAGNOSIS — Z952 Presence of prosthetic heart valve: Secondary | ICD-10-CM

## 2018-08-18 DIAGNOSIS — Z79899 Other long term (current) drug therapy: Secondary | ICD-10-CM | POA: Diagnosis not present

## 2018-08-18 DIAGNOSIS — I714 Abdominal aortic aneurysm, without rupture, unspecified: Secondary | ICD-10-CM | POA: Diagnosis present

## 2018-08-18 DIAGNOSIS — I420 Dilated cardiomyopathy: Secondary | ICD-10-CM | POA: Diagnosis present

## 2018-08-18 DIAGNOSIS — Z8546 Personal history of malignant neoplasm of prostate: Secondary | ICD-10-CM | POA: Diagnosis not present

## 2018-08-18 DIAGNOSIS — I110 Hypertensive heart disease with heart failure: Secondary | ICD-10-CM | POA: Diagnosis not present

## 2018-08-18 DIAGNOSIS — I472 Ventricular tachycardia, unspecified: Secondary | ICD-10-CM

## 2018-08-18 DIAGNOSIS — I700 Atherosclerosis of aorta: Secondary | ICD-10-CM | POA: Diagnosis present

## 2018-08-18 DIAGNOSIS — Z888 Allergy status to other drugs, medicaments and biological substances status: Secondary | ICD-10-CM | POA: Diagnosis not present

## 2018-08-18 DIAGNOSIS — Z9181 History of falling: Secondary | ICD-10-CM | POA: Diagnosis not present

## 2018-08-18 DIAGNOSIS — I429 Cardiomyopathy, unspecified: Secondary | ICD-10-CM | POA: Diagnosis not present

## 2018-08-18 DIAGNOSIS — I5023 Acute on chronic systolic (congestive) heart failure: Secondary | ICD-10-CM | POA: Diagnosis present

## 2018-08-18 DIAGNOSIS — Z961 Presence of intraocular lens: Secondary | ICD-10-CM | POA: Diagnosis present

## 2018-08-18 DIAGNOSIS — I509 Heart failure, unspecified: Secondary | ICD-10-CM | POA: Diagnosis not present

## 2018-08-18 DIAGNOSIS — M1990 Unspecified osteoarthritis, unspecified site: Secondary | ICD-10-CM | POA: Diagnosis present

## 2018-08-18 DIAGNOSIS — Z7902 Long term (current) use of antithrombotics/antiplatelets: Secondary | ICD-10-CM | POA: Diagnosis not present

## 2018-08-18 DIAGNOSIS — Z7982 Long term (current) use of aspirin: Secondary | ICD-10-CM | POA: Diagnosis not present

## 2018-08-18 DIAGNOSIS — Z87440 Personal history of urinary (tract) infections: Secondary | ICD-10-CM | POA: Diagnosis not present

## 2018-08-18 DIAGNOSIS — G4730 Sleep apnea, unspecified: Secondary | ICD-10-CM | POA: Diagnosis present

## 2018-08-18 DIAGNOSIS — I5043 Acute on chronic combined systolic (congestive) and diastolic (congestive) heart failure: Secondary | ICD-10-CM | POA: Diagnosis not present

## 2018-08-18 DIAGNOSIS — I340 Nonrheumatic mitral (valve) insufficiency: Secondary | ICD-10-CM | POA: Diagnosis not present

## 2018-08-18 DIAGNOSIS — J45909 Unspecified asthma, uncomplicated: Secondary | ICD-10-CM | POA: Diagnosis present

## 2018-08-18 DIAGNOSIS — Z48812 Encounter for surgical aftercare following surgery on the circulatory system: Secondary | ICD-10-CM | POA: Diagnosis not present

## 2018-08-18 DIAGNOSIS — I4891 Unspecified atrial fibrillation: Secondary | ICD-10-CM | POA: Diagnosis not present

## 2018-08-18 DIAGNOSIS — G4733 Obstructive sleep apnea (adult) (pediatric): Secondary | ICD-10-CM | POA: Diagnosis not present

## 2018-08-18 DIAGNOSIS — Z452 Encounter for adjustment and management of vascular access device: Secondary | ICD-10-CM | POA: Diagnosis not present

## 2018-08-18 DIAGNOSIS — R911 Solitary pulmonary nodule: Secondary | ICD-10-CM | POA: Diagnosis not present

## 2018-08-18 HISTORY — DX: Unspecified hearing loss, unspecified ear: H91.90

## 2018-08-18 HISTORY — DX: Presence of prosthetic heart valve: Z95.2

## 2018-08-18 HISTORY — PX: TEE WITHOUT CARDIOVERSION: SHX5443

## 2018-08-18 HISTORY — PX: TRANSCATHETER AORTIC VALVE REPLACEMENT, TRANSFEMORAL: SHX6400

## 2018-08-18 HISTORY — DX: Nonrheumatic aortic (valve) stenosis: I35.0

## 2018-08-18 LAB — POCT I-STAT, CHEM 8
BUN: 19 mg/dL (ref 8–23)
BUN: 19 mg/dL (ref 8–23)
CALCIUM ION: 1.21 mmol/L (ref 1.15–1.40)
CHLORIDE: 99 mmol/L (ref 98–111)
CREATININE: 0.9 mg/dL (ref 0.61–1.24)
CREATININE: 1 mg/dL (ref 0.61–1.24)
Calcium, Ion: 1.15 mmol/L (ref 1.15–1.40)
Chloride: 101 mmol/L (ref 98–111)
GLUCOSE: 176 mg/dL — AB (ref 70–99)
Glucose, Bld: 168 mg/dL — ABNORMAL HIGH (ref 70–99)
HCT: 32 % — ABNORMAL LOW (ref 39.0–52.0)
HEMATOCRIT: 33 % — AB (ref 39.0–52.0)
HEMOGLOBIN: 11.2 g/dL — AB (ref 13.0–17.0)
Hemoglobin: 10.9 g/dL — ABNORMAL LOW (ref 13.0–17.0)
POTASSIUM: 3.5 mmol/L (ref 3.5–5.1)
POTASSIUM: 3.6 mmol/L (ref 3.5–5.1)
Sodium: 137 mmol/L (ref 135–145)
Sodium: 137 mmol/L (ref 135–145)
TCO2: 25 mmol/L (ref 22–32)
TCO2: 28 mmol/L (ref 22–32)

## 2018-08-18 SURGERY — IMPLANTATION, AORTIC VALVE, TRANSCATHETER, FEMORAL APPROACH
Anesthesia: Monitor Anesthesia Care | Site: Chest

## 2018-08-18 MED ORDER — LIDOCAINE HCL 1 % IJ SOLN
INTRAMUSCULAR | Status: AC
  Filled 2018-08-18: qty 20

## 2018-08-18 MED ORDER — ONDANSETRON HCL 4 MG/2ML IJ SOLN: 4.0000 mg | Freq: Four times a day (QID) | INTRAMUSCULAR | Status: DC | PRN

## 2018-08-18 MED ORDER — TRAMADOL HCL 50 MG PO TABS: 50.0000 mg | ORAL_TABLET | ORAL | Status: DC | PRN

## 2018-08-18 MED ORDER — LACTATED RINGERS IV SOLN
INTRAVENOUS | Status: DC | PRN
  Administered 2018-08-18: 07:00:00 via INTRAVENOUS

## 2018-08-18 MED ORDER — POLYETHYLENE GLYCOL 3350 17 G PO PACK
17.0000 g | PACK | Freq: Every evening | ORAL | Status: DC
  Administered 2018-08-18: 17 g via ORAL
  Filled 2018-08-18: qty 1

## 2018-08-18 MED ORDER — ROCURONIUM BROMIDE 50 MG/5ML IV SOSY
PREFILLED_SYRINGE | INTRAVENOUS | Status: AC
  Filled 2018-08-18: qty 5

## 2018-08-18 MED ORDER — ASPIRIN 81 MG PO CHEW
81.0000 mg | CHEWABLE_TABLET | Freq: Every day | ORAL | Status: DC
  Administered 2018-08-19 – 2018-08-20 (×2): 81 mg via ORAL
  Filled 2018-08-18 (×2): qty 1

## 2018-08-18 MED ORDER — VANCOMYCIN HCL IN DEXTROSE 1-5 GM/200ML-% IV SOLN
1000.0000 mg | Freq: Once | INTRAVENOUS | Status: AC
  Administered 2018-08-18: 1000 mg via INTRAVENOUS
  Filled 2018-08-18: qty 200

## 2018-08-18 MED ORDER — ONDANSETRON HCL 4 MG/2ML IJ SOLN
INTRAMUSCULAR | Status: DC | PRN
  Administered 2018-08-18: 4 mg via INTRAVENOUS

## 2018-08-18 MED ORDER — CHLORHEXIDINE GLUCONATE 0.12 % MT SOLN
OROMUCOSAL | Status: AC
  Filled 2018-08-18: qty 15

## 2018-08-18 MED ORDER — LIDOCAINE 2% (20 MG/ML) 5 ML SYRINGE
INTRAMUSCULAR | Status: AC
  Filled 2018-08-18: qty 5

## 2018-08-18 MED ORDER — POLYETHYLENE GLYCOL 3350 17 G PO PACK
8.5000 g | PACK | Freq: Every evening | ORAL | Status: DC
  Administered 2018-08-19: 8.5 g via ORAL
  Filled 2018-08-18 (×2): qty 1

## 2018-08-18 MED ORDER — LIDOCAINE HCL 1 % IJ SOLN
INTRAMUSCULAR | Status: DC | PRN
  Administered 2018-08-18: 20 mL

## 2018-08-18 MED ORDER — SODIUM CHLORIDE 0.9 % IV SOLN
1.5000 g | Freq: Two times a day (BID) | INTRAVENOUS | Status: DC
  Administered 2018-08-18 – 2018-08-19 (×2): 1.5 g via INTRAVENOUS
  Filled 2018-08-18 (×2): qty 1.5

## 2018-08-18 MED ORDER — PROPOFOL 10 MG/ML IV BOLUS
INTRAVENOUS | Status: DC | PRN
  Administered 2018-08-18 (×3): 10 mg via INTRAVENOUS

## 2018-08-18 MED ORDER — MORPHINE SULFATE (PF) 10 MG/ML IV SOLN: 2.0000 mg | INTRAVENOUS | Status: DC | PRN

## 2018-08-18 MED ORDER — ALBUMIN HUMAN 5 % IV SOLN
12.5000 g | Freq: Once | INTRAVENOUS | Status: AC
  Administered 2018-08-18: 12.5 g via INTRAVENOUS
  Filled 2018-08-18: qty 250

## 2018-08-18 MED ORDER — SODIUM CHLORIDE 0.9 % IV SOLN: 250.0000 mL | INTRAVENOUS | Status: DC | PRN

## 2018-08-18 MED ORDER — FENTANYL CITRATE (PF) 250 MCG/5ML IJ SOLN
INTRAMUSCULAR | Status: DC | PRN
  Administered 2018-08-18: 50 ug via INTRAVENOUS

## 2018-08-18 MED ORDER — PHENYLEPHRINE HCL-NACL 20-0.9 MG/250ML-% IV SOLN
0.0000 ug/min | INTRAVENOUS | Status: DC
  Filled 2018-08-18: qty 250

## 2018-08-18 MED ORDER — CLOPIDOGREL BISULFATE 75 MG PO TABS
75.0000 mg | ORAL_TABLET | Freq: Every day | ORAL | Status: DC
  Administered 2018-08-19 – 2018-08-20 (×2): 75 mg via ORAL
  Filled 2018-08-18 (×2): qty 1

## 2018-08-18 MED ORDER — MORPHINE SULFATE (PF) 2 MG/ML IV SOLN: 2.0000 mg | INTRAVENOUS | Status: DC | PRN

## 2018-08-18 MED ORDER — PROTAMINE SULFATE 10 MG/ML IV SOLN
INTRAVENOUS | Status: DC | PRN
  Administered 2018-08-18: 150 mg via INTRAVENOUS

## 2018-08-18 MED ORDER — SODIUM CHLORIDE 0.9 % IJ SOLN
INTRAMUSCULAR | Status: AC
  Filled 2018-08-18: qty 10

## 2018-08-18 MED ORDER — SODIUM CHLORIDE 0.9% FLUSH: 3.0000 mL | Freq: Two times a day (BID) | INTRAVENOUS | Status: DC

## 2018-08-18 MED ORDER — FENTANYL CITRATE (PF) 250 MCG/5ML IJ SOLN
INTRAMUSCULAR | Status: AC
  Filled 2018-08-18: qty 5

## 2018-08-18 MED ORDER — CHLORHEXIDINE GLUCONATE 4 % EX LIQD: 60.0000 mL | Freq: Once | CUTANEOUS | Status: DC

## 2018-08-18 MED ORDER — METOPROLOL TARTRATE 5 MG/5ML IV SOLN: 2.5000 mg | INTRAVENOUS | Status: DC | PRN

## 2018-08-18 MED ORDER — PROPOFOL 500 MG/50ML IV EMUL
INTRAVENOUS | Status: DC | PRN
  Administered 2018-08-18: 10 ug/kg/min via INTRAVENOUS

## 2018-08-18 MED ORDER — HEPARIN SODIUM (PORCINE) 1000 UNIT/ML IJ SOLN
INTRAMUSCULAR | Status: DC | PRN
  Administered 2018-08-18: 15000 [IU] via INTRAVENOUS

## 2018-08-18 MED ORDER — SODIUM CHLORIDE 0.9 % IV SOLN: INTRAVENOUS | Status: DC

## 2018-08-18 MED ORDER — AMIODARONE HCL 200 MG PO TABS
100.0000 mg | ORAL_TABLET | Freq: Every day | ORAL | Status: DC
  Administered 2018-08-18 – 2018-08-20 (×3): 100 mg via ORAL
  Filled 2018-08-18 (×3): qty 1

## 2018-08-18 MED ORDER — ACETAMINOPHEN 650 MG RE SUPP: 650.0000 mg | Freq: Four times a day (QID) | RECTAL | Status: DC | PRN

## 2018-08-18 MED ORDER — ACETAMINOPHEN 325 MG PO TABS: 650.0000 mg | ORAL_TABLET | Freq: Four times a day (QID) | ORAL | Status: DC | PRN

## 2018-08-18 MED ORDER — PHENYLEPHRINE 40 MCG/ML (10ML) SYRINGE FOR IV PUSH (FOR BLOOD PRESSURE SUPPORT)
PREFILLED_SYRINGE | INTRAVENOUS | Status: AC
  Filled 2018-08-18: qty 10

## 2018-08-18 MED ORDER — EPHEDRINE 5 MG/ML INJ
INTRAVENOUS | Status: AC
  Filled 2018-08-18: qty 10

## 2018-08-18 MED ORDER — SODIUM CHLORIDE 0.9 % IV SOLN
INTRAVENOUS | Status: AC
  Filled 2018-08-18 (×3): qty 1.2

## 2018-08-18 MED ORDER — DEXMEDETOMIDINE HCL 200 MCG/2ML IV SOLN
INTRAVENOUS | Status: DC | PRN
  Administered 2018-08-18: 50 ug via INTRAVENOUS

## 2018-08-18 MED ORDER — IODIXANOL 320 MG/ML IV SOLN
INTRAVENOUS | Status: DC | PRN
  Administered 2018-08-18: 81 mL via INTRAVENOUS

## 2018-08-18 MED ORDER — CHLORHEXIDINE GLUCONATE 4 % EX LIQD: 30.0000 mL | CUTANEOUS | Status: DC

## 2018-08-18 MED ORDER — SODIUM CHLORIDE 0.9% FLUSH: 3.0000 mL | INTRAVENOUS | Status: DC | PRN

## 2018-08-18 MED ORDER — PROPOFOL 10 MG/ML IV BOLUS
INTRAVENOUS | Status: AC
  Filled 2018-08-18: qty 20

## 2018-08-18 MED ORDER — PROTAMINE SULFATE 10 MG/ML IV SOLN
INTRAVENOUS | Status: AC
  Filled 2018-08-18: qty 25

## 2018-08-18 MED ORDER — SODIUM CHLORIDE 0.9 % IV SOLN
INTRAVENOUS | Status: AC
  Administered 2018-08-18: 11:00:00 via INTRAVENOUS

## 2018-08-18 MED ORDER — OXYCODONE HCL 5 MG PO TABS: 5.0000 mg | ORAL_TABLET | ORAL | Status: DC | PRN

## 2018-08-18 MED ORDER — SODIUM CHLORIDE 0.9 % IV SOLN
INTRAVENOUS | Status: DC | PRN
  Administered 2018-08-18: 1500 mL

## 2018-08-18 MED ORDER — NITROGLYCERIN IN D5W 200-5 MCG/ML-% IV SOLN: 0.0000 ug/min | INTRAVENOUS | Status: DC

## 2018-08-18 MED ORDER — ONDANSETRON HCL 4 MG/2ML IJ SOLN
INTRAMUSCULAR | Status: AC
  Filled 2018-08-18: qty 2

## 2018-08-18 MED ORDER — EZETIMIBE-SIMVASTATIN 10-20 MG PO TABS
1.0000 | ORAL_TABLET | Freq: Every day | ORAL | Status: DC
  Administered 2018-08-18 – 2018-08-19 (×2): 1 via ORAL
  Filled 2018-08-18 (×2): qty 1

## 2018-08-18 MED ORDER — PHENYLEPHRINE 40 MCG/ML (10ML) SYRINGE FOR IV PUSH (FOR BLOOD PRESSURE SUPPORT)
PREFILLED_SYRINGE | INTRAVENOUS | Status: DC | PRN
  Administered 2018-08-18 (×2): 80 ug via INTRAVENOUS

## 2018-08-18 MED ORDER — NOREPINEPHRINE 4 MG/250ML-% IV SOLN
0.0000 ug/min | INTRAVENOUS | Status: DC
  Administered 2018-08-18: 2 ug/min via INTRAVENOUS

## 2018-08-18 MED ORDER — CHLORHEXIDINE GLUCONATE 0.12 % MT SOLN: 15.0000 mL | Freq: Once | OROMUCOSAL | Status: DC

## 2018-08-18 MED ORDER — 0.9 % SODIUM CHLORIDE (POUR BTL) OPTIME
TOPICAL | Status: DC | PRN
  Administered 2018-08-18: 1000 mL

## 2018-08-18 SURGICAL SUPPLY — 95 items
ADH SKN CLS APL DERMABOND .7 (GAUZE/BANDAGES/DRESSINGS) ×2
BAG DECANTER FOR FLEXI CONT (MISCELLANEOUS) IMPLANT
BAG SNAP BAND KOVER 36X36 (MISCELLANEOUS) ×6 IMPLANT
BLADE CLIPPER SURG (BLADE) IMPLANT
BLADE OSCILLATING /SAGITTAL (BLADE) IMPLANT
BLADE STERNUM SYSTEM 6 (BLADE) IMPLANT
CABLE ADAPT CONN TEMP 6FT (ADAPTER) ×4 IMPLANT
CANNULA FEM VENOUS REMOTE 22FR (CANNULA) IMPLANT
CANNULA OPTISITE PERFUSION 16F (CANNULA) IMPLANT
CANNULA OPTISITE PERFUSION 18F (CANNULA) IMPLANT
CATH DIAG 6FR JR4 (CATHETERS) ×2 IMPLANT
CATH DIAG EXPO 6F VENT PIG 145 (CATHETERS) ×8 IMPLANT
CATH EXPO 5FR AL1 (CATHETERS) ×2 IMPLANT
CATH INFINITI 6F AL2 (CATHETERS) ×2 IMPLANT
CATH INFINITI 6F MPB2 (CATHETERS) ×2 IMPLANT
CATH S G BIP PACING (SET/KITS/TRAYS/PACK) ×4 IMPLANT
CLIP VESOCCLUDE MED 24/CT (CLIP) IMPLANT
CLIP VESOCCLUDE SM WIDE 24/CT (CLIP) IMPLANT
CONT SPEC 4OZ CLIKSEAL STRL BL (MISCELLANEOUS) ×8 IMPLANT
COVER BACK TABLE 24X17X13 BIG (DRAPES) IMPLANT
COVER BACK TABLE 80X110 HD (DRAPES) ×4 IMPLANT
COVER DOME SNAP 22 D (MISCELLANEOUS) ×2 IMPLANT
COVER WAND RF STERILE (DRAPES) ×2 IMPLANT
CRADLE DONUT ADULT HEAD (MISCELLANEOUS) ×4 IMPLANT
DERMABOND ADVANCED (GAUZE/BANDAGES/DRESSINGS) ×2
DERMABOND ADVANCED .7 DNX12 (GAUZE/BANDAGES/DRESSINGS) ×2 IMPLANT
DEVICE CLOSURE PERCLS PRGLD 6F (VASCULAR PRODUCTS) ×4 IMPLANT
DRAPE INCISE IOBAN 66X45 STRL (DRAPES) IMPLANT
DRSG TEGADERM 4X4.75 (GAUZE/BANDAGES/DRESSINGS) ×6 IMPLANT
ELECT CAUTERY BLADE 6.4 (BLADE) IMPLANT
ELECT REM PT RETURN 9FT ADLT (ELECTROSURGICAL) ×4
ELECTRODE REM PT RTRN 9FT ADLT (ELECTROSURGICAL) ×4 IMPLANT
FELT TEFLON 6X6 (MISCELLANEOUS) IMPLANT
FEMORAL VENOUS CANN RAP (CANNULA) IMPLANT
GAUZE SPONGE 4X4 12PLY STRL (GAUZE/BANDAGES/DRESSINGS) ×4 IMPLANT
GAUZE SPONGE 4X4 12PLY STRL LF (GAUZE/BANDAGES/DRESSINGS) ×2 IMPLANT
GLOVE BIO SURGEON STRL SZ7.5 (GLOVE) ×4 IMPLANT
GLOVE BIO SURGEON STRL SZ8 (GLOVE) IMPLANT
GLOVE BIOGEL PI IND STRL 6.5 (GLOVE) IMPLANT
GLOVE BIOGEL PI INDICATOR 6.5 (GLOVE) ×12
GLOVE EUDERMIC 7 POWDERFREE (GLOVE) ×2 IMPLANT
GLOVE ORTHO TXT STRL SZ7.5 (GLOVE) IMPLANT
GOWN STRL REUS W/ TWL LRG LVL3 (GOWN DISPOSABLE) IMPLANT
GOWN STRL REUS W/ TWL XL LVL3 (GOWN DISPOSABLE) ×2 IMPLANT
GOWN STRL REUS W/TWL LRG LVL3 (GOWN DISPOSABLE) ×16
GOWN STRL REUS W/TWL XL LVL3 (GOWN DISPOSABLE) ×16
GUIDEWIRE SAFE TJ AMPLATZ EXST (WIRE) ×4 IMPLANT
GUIDEWIRE STRAIGHT .035 260CM (WIRE) ×4 IMPLANT
INSERT FOGARTY SM (MISCELLANEOUS) IMPLANT
KIT BASIN OR (CUSTOM PROCEDURE TRAY) ×4 IMPLANT
KIT DILATOR VASC 18G NDL (KITS) IMPLANT
KIT HEART LEFT (KITS) ×4 IMPLANT
KIT SUCTION CATH 14FR (SUCTIONS) IMPLANT
KIT TURNOVER KIT B (KITS) ×4 IMPLANT
LOOP VESSEL MAXI BLUE (MISCELLANEOUS) IMPLANT
LOOP VESSEL MINI RED (MISCELLANEOUS) IMPLANT
NDL PERC 18GX7CM (NEEDLE) ×2 IMPLANT
NEEDLE 22X1 1/2 (OR ONLY) (NEEDLE) ×2 IMPLANT
NEEDLE PERC 18GX7CM (NEEDLE) ×4 IMPLANT
NS IRRIG 1000ML POUR BTL (IV SOLUTION) ×4 IMPLANT
PACK ENDOVASCULAR (PACKS) ×4 IMPLANT
PAD ARMBOARD 7.5X6 YLW CONV (MISCELLANEOUS) ×8 IMPLANT
PAD ELECT DEFIB RADIOL ZOLL (MISCELLANEOUS) ×4 IMPLANT
PENCIL BUTTON HOLSTER BLD 10FT (ELECTRODE) IMPLANT
PERCLOSE PROGLIDE 6F (VASCULAR PRODUCTS) ×8
SET MICROPUNCTURE 5F STIFF (MISCELLANEOUS) ×4 IMPLANT
SHEATH BRITE TIP 6FR 35CM (SHEATH) ×4 IMPLANT
SHEATH PINNACLE 6F 10CM (SHEATH) ×4 IMPLANT
SHEATH PINNACLE 8F 10CM (SHEATH) ×4 IMPLANT
SLEEVE REPOSITIONING LENGTH 30 (MISCELLANEOUS) ×4 IMPLANT
SPONGE LAP 4X18 RFD (DISPOSABLE) IMPLANT
STOPCOCK MORSE 400PSI 3WAY (MISCELLANEOUS) ×8 IMPLANT
SUT ETHIBOND X763 2 0 SH 1 (SUTURE) IMPLANT
SUT GORETEX CV 4 TH 22 36 (SUTURE) IMPLANT
SUT GORETEX CV4 TH-18 (SUTURE) IMPLANT
SUT MNCRL AB 3-0 PS2 18 (SUTURE) IMPLANT
SUT PROLENE 5 0 C 1 36 (SUTURE) IMPLANT
SUT PROLENE 6 0 C 1 30 (SUTURE) IMPLANT
SUT SILK  1 MH (SUTURE) ×2
SUT SILK 1 MH (SUTURE) ×2 IMPLANT
SUT VIC AB 2-0 CT1 27 (SUTURE)
SUT VIC AB 2-0 CT1 TAPERPNT 27 (SUTURE) IMPLANT
SUT VIC AB 2-0 CTX 36 (SUTURE) IMPLANT
SUT VIC AB 3-0 SH 8-18 (SUTURE) IMPLANT
SYR 30ML LL (SYRINGE) ×2 IMPLANT
SYR 50ML LL SCALE MARK (SYRINGE) ×4 IMPLANT
SYR BULB IRRIGATION 50ML (SYRINGE) IMPLANT
SYR CONTROL 10ML LL (SYRINGE) ×2 IMPLANT
TAPE CLOTH SURG 4X10 WHT LF (GAUZE/BANDAGES/DRESSINGS) ×2 IMPLANT
TOWEL GREEN STERILE (TOWEL DISPOSABLE) ×8 IMPLANT
TRANSDUCER W/STOPCOCK (MISCELLANEOUS) ×8 IMPLANT
TRAY FOLEY SLVR 16FR TEMP STAT (SET/KITS/TRAYS/PACK) IMPLANT
VALVE HEART TRANSCATH SZ3 29MM (Prosthesis & Implant Heart) ×2 IMPLANT
WIRE .035 3MM-J 145CM (WIRE) ×4 IMPLANT
WIRE AMPLATZ SS-J .035X180CM (WIRE) ×2 IMPLANT

## 2018-08-18 NOTE — Progress Notes (Signed)
Hematoma noted on the perclosed right femoral groin - 3in X1in.  Ellsworth Lennox held pressure for 15 minutes.  Level 0 post manual pressure.

## 2018-08-18 NOTE — Progress Notes (Signed)
  [facility] VALVE TEAM  Patient doing well s/p TAVR. He is hemodynamically stable but still requiring 81mcg of Levophed. Very slow to wake up. Groin sites stable. ECG with no high grade block. Plan to transfer to [facility]. Early ambulation after bedrest completed and hopeful discharge over the next 24-48 hours.   Angelena Form PA-C  [facility]  Pager [phone]

## 2018-08-18 NOTE — Progress Notes (Signed)
All groin and pulse checks performed per protocol - completed at 1205.  Both sites are level 0.  Left DP is able to be dopplered and right DP is 3+.

## 2018-08-18 NOTE — Interval H&P Note (Signed)
History and Physical Interval Note:  08/18/2018 7:00 AM  William [name]  has presented today for surgery, with the diagnosis of Severe Aortic Stenosis  The various methods of treatment have been discussed with the patient and family. After consideration of risks, benefits and other options for treatment, the patient has consented to  Procedure(s): TRANSCATHETER AORTIC VALVE REPLACEMENT, TRANSFEMORAL (N/A) TRANSESOPHAGEAL ECHOCARDIOGRAM (TEE) (N/A) as a surgical intervention .  The patient's history has been reviewed, patient examined, no change in status, stable for surgery.  I have reviewed the patient's chart and labs.  Questions were answered to the patient's satisfaction.     Shortness of breath: Yes.   If yes: with what activity?: any Worse than previously noted?: No.  New edema, PND, orthopnea: Yes.    Recent decrease in activity i.e. more difficulty walking to mailbox, climbing stairs, etc: Yes.    Changes in sleeping i.e. need to utilize to sleep on more pillows, sitting up, etc: No.  Changes since last seen in pre-op visit: Yes.    BNP 459.6 on 08/13/2018, LVEF 25% consistent with acute on chronic combined systolic and diastolic congestive heart failure.  Gaye Pollack

## 2018-08-18 NOTE — CV Procedure (Addendum)
[facility]  TAVR OPERATIVE NOTE   Date of Procedure:  08/18/2018  Preoperative Diagnosis: Severe Aortic Stenosis   Postoperative Diagnosis: Same   Procedure:    Transcatheter Aortic Valve Replacement - Transfemoral Approach  Edwards Sapien 3 THV (size 29 mm, model # [id number], serial # [id number])   Co-Surgeons:  Lauree Chandler, MD and Gaye Pollack, MD   Anesthesiologist:  Tobias Alexander  Echocardiographer:  Meda Coffee  Pre-operative Echo Findings:  Severe aortic stenosis  Moderate left ventricular systolic dysfunction  Post-operative Echo Findings:  No paravalvular leak  Moderate left ventricular systolic dysfunction  BRIEF CLINICAL NOTE AND INDICATIONS FOR SURGERY  84 yo male with history of non-ischemic cardiomyopathy s/p ICD, AAA, arthritis, chronic systolic CHF, VT, prostate cancer, mild to moderate non-obstructive CAD and severe aortic stenosis here today for TAVR. He has been followed by Dr. Sallyanne Kuster. Recent echo 07/03/18 with LVEF=20-25%. Mean gradient 21 mmHg, peak gradient 53 mmHg. Dimensionless index 0.16. AVA 0.66 cm2. Cardiac cath 07/06/18 with mild non-obstructive CAD. He has a history of sustained ventricular tachycardia treated with amiodarone. He has a dual chamber ICD in place. He tells me today that he has been having progressive dyspnea. He notices dyspnea with minimal exertion. He has lower extremity edema. He still ambulates but only several times per day due to fear of falling. He fell down the stairs 5 years ago and broke his back. He was admitted to [facility] 07/02/18 from the office with volume overload and was diuresed 4.6 liters.  During the course of the patient's preoperative work up they have been evaluated comprehensively by a multidisciplinary team of specialists coordinated through the [facility] in the [facility].  They have been demonstrated to suffer from symptomatic severe aortic  stenosis as noted above. The patient has been counseled extensively as to the relative risks and benefits of all options for the treatment of severe aortic stenosis including long term medical therapy, conventional surgery for aortic valve replacement, and transcatheter aortic valve replacement.  The patient has been independently evaluated by CT surgery Dr. Cyndia Bent, and he is felt to be at high risk for conventional surgical aortic valve replacement. Dr. Cyndia Bent indicated the patient would be a poor candidate for conventional surgery. Based upon review of all of the patient's preoperative diagnostic tests they are felt to be candidate for transcatheter aortic valve replacement using the transfemoral approach as an alternative to high risk conventional surgery.    Following the decision to proceed with transcatheter aortic valve replacement, a discussion has been held regarding what types of management strategies would be attempted intraoperatively in the event of life-threatening complications, including whether or not the patient would be considered a candidate for the use of cardiopulmonary bypass and/or conversion to open sternotomy for attempted surgical intervention.  The patient has been advised of a variety of complications that might develop peculiar to this approach including but not limited to risks of death, stroke, paravalvular leak, aortic dissection or other major vascular complications, aortic annulus rupture, device embolization, cardiac rupture or perforation, acute myocardial infarction, arrhythmia, heart block or bradycardia requiring permanent pacemaker placement, congestive heart failure, respiratory failure, renal failure, pneumonia, infection, other late complications related to structural valve deterioration or migration, or other complications that might ultimately cause a temporary or permanent loss of functional independence or other long term morbidity.  The patient provides full  informed consent for the procedure as described and all questions were answered preoperatively.  DETAILS OF THE OPERATIVE PROCEDURE  PREPARATION:   The patient is brought to the operating room on the above mentioned date and central monitoring was established by the anesthesia team including placement of a radial arterial line. The patient is placed in the supine position on the operating table.  Intravenous antibiotics are administered. Conscious sedation is used.   Baseline transthoracic echocardiogram was performed. The patient's chest, abdomen, both groins, and both lower extremities are prepared and draped in a sterile manner. A time out procedure is performed.   PERIPHERAL ACCESS:   Using the modified Seldinger technique, femoral arterial and venous access were obtained with placement of 6 Fr sheaths on the left side.  A pigtail diagnostic catheter was passed through the femoral arterial sheath under fluoroscopic guidance into the aortic root.  A temporary transvenous pacemaker catheter was passed through the femoral venous sheath under fluoroscopic guidance into the right ventricle.  The pacemaker was tested to ensure stable lead placement and pacemaker capture. Aortic root angiography was performed in order to determine the optimal angiographic angle for valve deployment.  TRANSFEMORAL ACCESS:  A micropuncture kit was used to gain access to the right femoral artery. Position confirmed with angiography. Pre-closure with double ProGlide closure devices. The patient was heparinized systemically and ACT verified > 250 seconds.    A 16 Fr transfemoral E-sheath was introduced into the right femoral artery after progressively dilating over an Amplatz superstiff wire. A JR4 catheter was used to direct a straight-tip exchange length wire across the native aortic valve into the left ventricle. This was exchanged out for a pigtail catheter and position was confirmed in the LV apex. Simultaneous LV and  Ao pressures were recorded.  The pigtail catheter was then exchanged for an Amplatz Extra-stiff wire in the LV apex.   TRANSCATHETER HEART VALVE DEPLOYMENT:  An Edwards Sapien 3 THV (size 29 mm) was prepared and crimped per manufacturer's guidelines, and the proper orientation of the valve is confirmed on the [employer] delivery system. The valve was advanced through the introducer sheath using normal technique until in an appropriate position in the abdominal aorta beyond the sheath tip. The balloon was then retracted and using the fine-tuning wheel was centered on the valve. The valve was then advanced across the aortic arch using appropriate flexion of the catheter. The valve was carefully positioned across the aortic valve annulus. The Commander catheter was retracted using normal technique. Once final position of the valve has been confirmed by angiographic assessment, the valve is deployed while temporarily holding ventilation and during rapid ventricular pacing to maintain systolic blood pressure < 50 mmHg and pulse pressure < 10 mmHg. The balloon inflation is held for >3 seconds after reaching full deployment volume. Once the balloon has fully deflated the balloon is retracted into the ascending aorta and valve function is assessed using TTE. There is felt to be no paravalvular leak and no central aortic insufficiency.  The patient's hemodynamic recovery following valve deployment is good.  The deployment balloon and guidewire are both removed. Echo demostrated acceptable post-procedural gradients, stable mitral valve function, and no AI.   PROCEDURE COMPLETION:  The sheath was then removed and closure devices were completed. Protamine was administered once femoral arterial repair was complete. The temporary pacemaker, pigtail catheters and femoral sheaths were removed with manual pressure used for hemostasis.   The patient tolerated the procedure well and is transported to the surgical  intensive care in stable condition. There were no immediate intraoperative complications.  All sponge instrument and needle counts are verified correct at completion of the operation.   No blood products were administered during the operation.  The patient received a total of 81 mL of intravenous contrast during the procedure.  Lauree Chandler MD 08/18/2018 8:05 AM

## 2018-08-18 NOTE — Anesthesia Procedure Notes (Signed)
Arterial Line Insertion Start/End11/02/2018 6:45 AM, 08/18/2018 7:15 AM Performed by: Jearld Pies, CRNA, CRNA  Patient location: Pre-op. Preanesthetic checklist: patient identified, IV checked, site marked, risks and benefits discussed, surgical consent, monitors and equipment checked, pre-op evaluation, timeout performed and anesthesia consent Lidocaine 1% used for infiltration and patient sedated Left, radial was placed Catheter size: 20 G Hand hygiene performed , maximum sterile barriers used  and Seldinger technique used Allen's test indicative of satisfactory collateral circulation Attempts: 1 Procedure performed without using ultrasound guided technique. Following insertion, dressing applied and Biopatch. Post procedure assessment: normal  Patient tolerated the procedure well with no immediate complications.

## 2018-08-18 NOTE — Progress Notes (Signed)
Dannial Monarch of [employer] turned on and interrogated ICD.

## 2018-08-18 NOTE — Progress Notes (Signed)
Patient ID: William [name], male   DOB: [birth year], 84 y.o.   MRN: [id number] [facility] Evening Rounds  Hemodynamically stable on levophed 2 mcg Awake and alert. No complaints  Groin sites ok  Feet warm

## 2018-08-18 NOTE — Progress Notes (Signed)
Site: left femoral Sheath Size: 50fr - venous Condition prior to removal:  Level 0 Type of pressure held: Manual Time pressure held: 10 Status of patient during pull: stable w/ pressor support Condition of site post pull:  Level 0 Type of dressing applied: gauze w/ transparent Pulses verified: dopplered bilat. DPs - right DP is 3+ Patient's condition post pull: stable Bedrest begins at: 1105 Post instructions given to patient: asleep  Ellsworth Lennox, RN performed pull / [phone]

## 2018-08-18 NOTE — Progress Notes (Signed)
2D Echocardiogram has been performed.  William [name] 08/18/2018, 9:49 AM

## 2018-08-18 NOTE — Anesthesia Postprocedure Evaluation (Signed)
Anesthesia Post Note  Patient: William [name]  Procedure(s) Performed: TRANSCATHETER AORTIC VALVE REPLACEMENT, TRANSFEMORAL using an Edwards 20m Aortic Valve (N/A Chest) TRANSESOPHAGEAL ECHOCARDIOGRAM (TEE) (N/A )     Patient location during evaluation: PACU Anesthesia Type: MAC Level of consciousness: awake and alert Pain management: pain level controlled Vital Signs Assessment: post-procedure vital signs reviewed and stable Respiratory status: spontaneous breathing and respiratory function stable Cardiovascular status: stable Postop Assessment: no apparent nausea or vomiting Anesthetic complications: no    Last Vitals:  Vitals:   08/18/18 1230 08/18/18 1245  BP: (!) 97/59 93/61  Pulse: 71 71  Resp: (!) 0 14  Temp:    SpO2: 93% 96%    Last Pain:  Vitals:   08/18/18 1130  TempSrc:   PainSc: Asleep                 Delecia Vastine DANIEL

## 2018-08-18 NOTE — Transfer of Care (Addendum)
Immediate Anesthesia Transfer of Care Note  Patient: William [name]  Procedure(s) Performed: TRANSCATHETER AORTIC VALVE REPLACEMENT, TRANSFEMORAL using an Edwards 51m Aortic Valve (N/A Chest) TRANSESOPHAGEAL ECHOCARDIOGRAM (TEE) (N/A )  Patient Location: Cath Lab  Anesthesia Type:MAC  Level of Consciousness: sedated  Airway & Oxygen Therapy: Patient Spontanous Breathing and Patient connected to nasal cannula oxygen  Post-op Assessment: Report given to RN and Post -op Vital signs reviewed and stable  Post vital signs: Reviewed and stable  Last Vitals:  Vitals Value Taken Time  BP 97/50   Temp    Pulse 72 08/18/2018 10:23 AM  Resp 17 08/18/2018 10:23 AM  SpO2 95 % 08/18/2018 10:23 AM  Vitals shown include unvalidated device data.  Last Pain:  Vitals:   08/18/18 0640  PainSc: 0-No pain         Complications: No apparent anesthesia complications   Patient currently on 2 mcg/kg Levophed.

## 2018-08-18 NOTE — Progress Notes (Signed)
Pt is beginning to awaken.  Family is at bedside encouraging pt to awaken.  Nasal cannula was irritating the pt and, therefore, has been removed.  Pt is 96% on RA with rousing.  Pt is following commands and responding to family appropriately.

## 2018-08-18 NOTE — Anesthesia Procedure Notes (Signed)
Central Venous Catheter Insertion Performed by: Duane Boston, MD, anesthesiologist Start/End11/02/2018 6:54 AM, 08/18/2018 7:04 AM Patient location: Pre-op. Preanesthetic checklist: patient identified, IV checked, site marked, risks and benefits discussed, surgical consent, monitors and equipment checked, pre-op evaluation, timeout performed and anesthesia consent Position: Trendelenburg Lidocaine 1% used for infiltration and patient sedated Hand hygiene performed , maximum sterile barriers used  and Seldinger technique used Catheter size: 8 Fr Total catheter length 16. Central line was placed.Double lumen Procedure performed using ultrasound guided technique. Ultrasound Notes:anatomy identified, needle tip was noted to be adjacent to the nerve/plexus identified, no ultrasound evidence of intravascular and/or intraneural injection and image(s) printed for medical record Attempts: 1 Following insertion, dressing applied, line sutured and Biopatch. Post procedure assessment: blood return through all ports, free fluid flow and no air  Patient tolerated the procedure well with no immediate complications.

## 2018-08-18 NOTE — Progress Notes (Addendum)
Site:  Left femoral / Arterial Sheath Size: 66fr Condition prior to removal:  Level 0  Type of pressure held: Manual Time pressure held: 20 Status of patient during pull: stable w/ pressor support Condition of site post pull:  Level 0 Type of dressing applied: gauze w/ transp. Pulses verified: doppler right, palpable left DP Patient's condition post pull: stable Bedrest begins at:  1105 / post venous pull Post instructions given to patient: pt is asleep   Pull performed by M. Nori Riis, RN - [phone]

## 2018-08-18 NOTE — Op Note (Signed)
[facility]   MULTIDISCIPLINARY HEART VALVE TEAM   TAVR OPERATIVE NOTE   Date of Procedure:  08/18/2018  Preoperative Diagnosis: Severe Aortic Stenosis   Postoperative Diagnosis: Same   Procedure:    Transcatheter Aortic Valve Replacement - Percutaneous Right Transfemoral Approach  Edwards Sapien 3 THV (size 29 mm, model # 9600TFX, serial # [id number])   Co-Surgeons:  Gaye Pollack, MD and Lauree Chandler, MD   Anesthesiologist:  Tamela Gammon, MD  Echocardiographer:  Ena Dawley, MD  Pre-operative Echo Findings:  Severe aortic stenosis  Moderate left ventricular systolic function  Post-operative Echo Findings:  No paravalvular leak  Moderate left ventricular systolic function   BRIEF CLINICAL NOTE AND INDICATIONS FOR SURGERY  The patient is an 84 year old gentleman with a history of abdominal aortic aneurysm, chronic systolic congestive heart failure status post ICD placement, ventricular tachycardia, history of sleep apnea, hyperlipidemia, and degenerative arthritis/lumbar vertebral fracture with reduced mobility as well as aortic stenosis. The patient has been followed by Dr. Sallyanne Kuster. He was recently admitted with progressive worsening shortness of breath and lower extremity edema over several weeks duration. He was treated with diuresis with significant improvement in his lower extremity edema as well as his breathing. HIs most recent echocardiogram on 07/03/2018 showed a mean gradient across aortic valve of 21 mmHg. Peak gradient was 53 mmHg and dimensionless index was 0.16. Aortic valve area was 0.66 cm. Left ventricular ejection fraction was 20 to 25%. He has had progressive dyspnea with low-level exertion. He is not very active due to a previous fall down the stairs at home about 5 years ago where he broke his back. He does ambulate some in his house using a walker and if he gets out and has to go any distance he uses a wheelchair. He still goes  out to eat at restaurants several times per week and still works in his business. He was seen by Dr. Angelena Form for consideration of transcatheter aortic valve replacement. Cardiac catheterization was performed on 07/06/2018 which showed mild nonobstructive coronary disease. He was seen by me in consultation on 10/22 and felt to be a candidate for TAVR. He was scheduled for TAVR on 08/11/2018 but unfortunately was admitted with urosepsis on 08/07/2018. Urine culture grew klebsiella and he was treated with antibiotics and fluid. TAVR was postponed. He made a good recovery and was discharged on 08/13/2018 to return for TAVR on 08/18/2018.  He has stage D, severe, symptomatic aortic stenosis with New York Heart Association class III symptoms of exertional fatigue and shortness of breath as well as severe left ventricular systolic dysfunction with ejection fraction of 20 to 25% consistent with chronic combined systolic and diastolic congestive heart failure. I have personally reviewed his echocardiogram, cardiac catheterization, and CTA studies. His echocardiogram shows a trileaflet aortic valve with moderately calcified leaflets and restricted mobility. The mean gradient is 21 mmHg with a dimensionless index of 0.14 with a left ventricular ejection fraction of 20 to 25% with diffuse hypokinesis and a low stroke volume index of 20 cc/m consistent with low gradient, low ejection fraction severe aortic stenosis. His cardiac catheterization shows nonobstructive coronary disease. The mean gradient across aortic valve was 26 mmHg with a peak to peak gradient of 33 mmHg and an aortic valve area of 0.74 by thermodilution and 0.89 by Fick. I think the best treatment for him is transcatheter aortic valve replacement. His gated cardiac CTA shows anatomy suitable for transcatheter aortic valve replacement using a 29 mm Sapien  3 valve. 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 I think the risk of embolism is still relatively low since this is on the lesser curve and the wire and valve will be on the greater curve. His abdominal and pelvic CTA shows pelvic vascular anatomy suitable for transfemoral insertion.   The patient and his wife and daughter were counseled at length regarding treatment alternatives for management of severe symptomatic aortic stenosis. The risks and benefits of surgical intervention has been discussed in detail. Long-term prognosis with medical therapy was discussed. Alternative approaches such as conventional surgical aortic valve replacement, transcatheter aortic valve replacement, and palliative medical therapy were compared and contrasted at length. This discussion was placed in the context of the patient's own specific clinical presentation and past medical history. All of their questions have been addressed.   Following the decision to proceed with transcatheter aortic valve replacement, a discussion was held regarding what types of management strategies would be attempted intraoperatively in the event of life-threatening complications, including whether or not the patient would be considered a candidate for the use of cardiopulmonary bypass and/or conversion to open sternotomy for attempted surgical intervention. I do not think he would be a candidate for median sternotomy under any circumstances.   The patient has been advised of a variety of complications that might develop including but not limited to risks of death, stroke, paravalvular leak, aortic dissection or other major vascular complications, aortic annulus rupture, device embolization, cardiac rupture or perforation, mitral regurgitation, acute myocardial infarction, arrhythmia, heart block or bradycardia requiring permanent pacemaker placement, congestive heart failure, respiratory failure, renal failure,  pneumonia, infection, other late complications related to structural valve deterioration or migration, or other complications that might ultimately cause a temporary or permanent loss of functional independence or other long term morbidity. The patient provides full informed consent for the procedure as described and all questions were answered.     DETAILS OF THE OPERATIVE PROCEDURE  PREPARATION:    The patient is brought to the operating room on the above mentioned date and central monitoring was established by the anesthesia team including placement of a central venous line and radial arterial line. The patient is placed in the supine position on the operating table.  Intravenous antibiotics are administered. The patient is monitored closely throughout the procedure under conscious sedation.  Baseline transthoracic echocardiogram was performed. The patient's chest, abdomen, both groins, and both lower extremities are prepared and draped in a sterile manner. A time out procedure is performed.   PERIPHERAL ACCESS:    Using the modified Seldinger technique, femoral arterial and venous access was obtained with placement of 6 Fr sheaths on the left side.  A pigtail diagnostic catheter was passed through the left arterial sheath under fluoroscopic guidance into the aortic root.  A temporary transvenous pacemaker catheter was passed through the left femoral venous sheath under fluoroscopic guidance into the right ventricle.  The pacemaker was tested to ensure stable lead placement and pacemaker capture. Aortic root angiography was performed in order to determine the optimal angiographic angle for valve deployment.   TRANSFEMORAL ACCESS:   Percutaneous transfemoral access and sheath placement was performed using ultrasound guidance.  The right common femoral artery was cannulated using a micropuncture needle and appropriate location was verified using hand injection angiogram.  A pair of Abbott  Perclose percutaneous  closure devices were placed and a 6 French sheath replaced into the femoral artery.  The patient was heparinized systemically and ACT verified > 250 seconds.    A 16 Fr transfemoral E-sheath was introduced into the rigt femoral artery after progressively dilating over an Amplatz superstiff wire. A JR4 catheter was used to direct a straight-tip exchange length wire across the native aortic valve into the left ventricle. This was exchanged out for a pigtail catheter and position was confirmed in the LV apex. Simultaneous LV and Ao pressures were recorded.  The pigtail catheter was exchanged for an Amplatz Extra-stiff wire in the LV apex.    BALLOON AORTIC VALVULOPLASTY:   Balloon aortic valvuloplasty was performed using a 25 mm valvuloplasty balloon.  Once optimal position was achieved, BAV was done under rapid ventricular pacing. The patient recovered well hemodynamically.    TRANSCATHETER HEART VALVE DEPLOYMENT:   An Edwards Sapien 3 transcatheter heart valve (size 29 mm, model #9600TFX, serial #[id number]) was prepared and crimped per manufacturer's guidelines, and the proper orientation of the valve is confirmed on the [employer] delivery system. The valve was advanced through the introducer sheath using normal technique until in an appropriate position in the abdominal aorta beyond the sheath tip. The balloon was then retracted and using the fine-tuning wheel was centered on the valve. The valve was then advanced across the aortic arch using appropriate flexion of the catheter. The valve was carefully positioned across the aortic valve annulus. The Commander catheter was retracted using normal technique. Once final position of the valve has been confirmed by angiographic assessment, the valve is deployed while temporarily holding ventilation and during rapid ventricular pacing to maintain systolic blood pressure < 50 mmHg and pulse pressure < 10 mmHg. The balloon inflation is  held for >3 seconds after reaching full deployment volume. Once the balloon has fully deflated the balloon is retracted into the ascending aorta and valve function is assessed using echocardiography. There is felt to be no paravalvular leak and no central aortic insufficiency.  The patient's hemodynamic recovery following valve deployment is good.  The deployment balloon and guidewire are both removed.    PROCEDURE COMPLETION:   The sheath was removed and femoral artery closure performed using the previously placed Perclose devices.   Protamine was administered once femoral arterial repair was complete. The temporary pacemaker, pigtail catheters and femoral sheaths were removed with manual pressure used for hemostasis.   The patient tolerated the procedure well and is transported to the surgical intensive care in stable condition. There were no immediate intraoperative complications. All sponge instrument and needle counts are verified correct at completion of the operation.   No blood products were administered during the operation.  The patient received a total of 81 mL of intravenous contrast during the procedure.   Gaye Pollack, MD 08/18/2018

## 2018-08-19 ENCOUNTER — Other Ambulatory Visit: Payer: Self-pay | Admitting: Physician Assistant

## 2018-08-19 ENCOUNTER — Inpatient Hospital Stay (HOSPITAL_COMMUNITY): Payer: Medicare Other

## 2018-08-19 ENCOUNTER — Encounter (HOSPITAL_COMMUNITY): Payer: Self-pay | Admitting: General Practice

## 2018-08-19 DIAGNOSIS — Z952 Presence of prosthetic heart valve: Secondary | ICD-10-CM

## 2018-08-19 DIAGNOSIS — I350 Nonrheumatic aortic (valve) stenosis: Secondary | ICD-10-CM

## 2018-08-19 DIAGNOSIS — Z954 Presence of other heart-valve replacement: Secondary | ICD-10-CM

## 2018-08-19 DIAGNOSIS — I340 Nonrheumatic mitral (valve) insufficiency: Secondary | ICD-10-CM

## 2018-08-19 LAB — BASIC METABOLIC PANEL
ANION GAP: 4 — AB (ref 5–15)
BUN: 15 mg/dL (ref 8–23)
CO2: 26 mmol/L (ref 22–32)
Calcium: 8.2 mg/dL — ABNORMAL LOW (ref 8.9–10.3)
Chloride: 105 mmol/L (ref 98–111)
Creatinine, Ser: 0.87 mg/dL (ref 0.61–1.24)
Glucose, Bld: 114 mg/dL — ABNORMAL HIGH (ref 70–99)
Potassium: 3.3 mmol/L — ABNORMAL LOW (ref 3.5–5.1)
SODIUM: 135 mmol/L (ref 135–145)

## 2018-08-19 LAB — CBC
HCT: 32.1 % — ABNORMAL LOW (ref 39.0–52.0)
Hemoglobin: 10.8 g/dL — ABNORMAL LOW (ref 13.0–17.0)
MCH: 32.5 pg (ref 26.0–34.0)
MCHC: 33.6 g/dL (ref 30.0–36.0)
MCV: 96.7 fL (ref 80.0–100.0)
NRBC: 0 % (ref 0.0–0.2)
PLATELETS: 124 10*3/uL — AB (ref 150–400)
RBC: 3.32 MIL/uL — AB (ref 4.22–5.81)
RDW: 15.8 % — ABNORMAL HIGH (ref 11.5–15.5)
WBC: 5.7 10*3/uL (ref 4.0–10.5)

## 2018-08-19 LAB — ECHOCARDIOGRAM COMPLETE
HEIGHTINCHES: 70 in
Weight: 3301.61 oz

## 2018-08-19 LAB — MAGNESIUM: MAGNESIUM: 2.4 mg/dL (ref 1.7–2.4)

## 2018-08-19 MED ORDER — POTASSIUM CHLORIDE 10 MEQ/50ML IV SOLN
10.0000 meq | INTRAVENOUS | Status: DC
  Administered 2018-08-19 (×3): 10 meq via INTRAVENOUS
  Filled 2018-08-19 (×2): qty 50

## 2018-08-19 MED ORDER — POTASSIUM CHLORIDE 10 MEQ/50ML IV SOLN
INTRAVENOUS | Status: AC
  Filled 2018-08-19: qty 50

## 2018-08-19 NOTE — Social Work (Signed)
CSW acknowledging consult for access to medications at discharge.  For medication access please consult RN Case Management.  Aware pt does have Medicare coverage and will not qualify for medication MATCH program.  CSW signing off. Please consult if any additional needs arise.  Alexander Mt, [place] Work [phone]

## 2018-08-19 NOTE — Evaluation (Signed)
Physical Therapy Evaluation Patient Details Name: William [name] MRN: [id number] DOB: 07-11-1928 Today's Date: 08/19/2018   History of Present Illness  84yo M w/ a hx of severe AoS, CHF w/ EF 20%, VT s/p AICD, prostate CA, and frequent UTI's who was admitted 10/25 with sepsis from a  urinary tract infection.  Went home on 10/31 with 24 hour care. Readmit for TAVR on 08/18/18.     Clinical Impression  Pt admitted with above diagnosis. Pt currently with functional limitations due to the deficits listed below (see PT Problem List). Pt was mod assist for all mobility.  Wife present and even though pt is very unsafe and requireing mod assist wife reports that he will have 24 hour care.  Pt  Does have all needed DME.  Wife encouraged to only use wheelchair with pt unless 2 persons present at home and wife agrees. Wife adamant pt is going home.  Pt will benefit from skilled PT to increase their independence and safety with mobility to allow discharge to the venue listed below.      Follow Up Recommendations Home health PT;Supervision/Assistance - 24 hour;Other (comment)(HHOT [facility])    Equipment Recommendations  Other (comment)(This PT issued gait belt)    Recommendations for Other Services       Precautions / Restrictions Precautions Precautions: Fall Restrictions Weight Bearing Restrictions: No      Mobility  Bed Mobility Overal bed mobility: Needs Assistance Bed Mobility: Supine to Sit     Supine to sit: Mod assist     General bed mobility comments: up via right elbow.  cues for technique and truncal assist up and forward.  scooting assist.  As pt coming to EOB, pt reached and pulled condom cath off.  Immediatly wet bed and floor with urine.  Cleaned pt and bed a s well as changed socks.    Transfers Overall transfer level: Needs assistance Equipment used: Rolling walker (2 wheeled) Transfers: Sit to/from [facility] Sit to Stand: Mod assist          General transfer comment: cues for hand placement.  Assist to come forward. Pt needed max cuesfor sequencing.  Pt unableto stand fully upright  Pt with trunk hip and knee flesxion.  Before pt could tgake a step, pt urinated again.  Had to clean again and place condom cath prior to walking .  Ambulation/Gait Ambulation/Gait assistance: Mod assist;+2 safety/equipment Gait Distance (Feet): 20 Feet Assistive device: Rolling walker (2 wheeled) Gait Pattern/deviations: Step-to pattern;Decreased stride length;Decreased step length - right;Decreased step length - left;Trunk flexed;Antalgic;Leaning posteriorly;Wide base of support;Drifts right/left   Gait velocity interpretation: <1.31 ft/sec, indicative of household ambulator General Gait Details: very wide BOS stance and gait with flexed posture and much too far positioning behind the RW.  Pt consistently trying to walk outside the RW.  Wife present and aware of pts gait deviations and the need for mod assist however she insists she can take care of him at home.  Issued gait belt to wife and encouarged use of wheelchari in home as do not feel that wife can walk with pt safely.  wife aware and agrees.   Stairs            Wheelchair Mobility    Modified Rankin (Stroke Patients Only)       Balance Overall balance assessment: Needs assistance Sitting-balance support: Feet supported;Single extremity supported;Bilateral upper extremity supported Sitting balance-Leahy Scale: Poor Sitting balance - Comments: eventually with list posteriorly. Postural control: Posterior  lean Standing balance support: Bilateral upper extremity supported Standing balance-Leahy Scale: Poor Standing balance comment: STands wtih external support and bil UE support with flexed posutrue and very poor balance                             Pertinent Vitals/Pain Pain Assessment: Faces Faces Pain Scale: Hurts a little bit Pain Location: general Pain  Descriptors / Indicators: Grimacing Pain Intervention(s): Monitored during session;Repositioned;Limited activity within patient's tolerance    Home Living Family/patient expects to be discharged to:: Private residence Living Arrangements: Spouse/significant other Available Help at Discharge: Family;Friend(s);Available 24 hours/day Type of Home: House Home Access: Stairs to enter   Entrance Stairs-Number of Steps: 1(threshold) Home Layout: Two level;Able to live on main level with bedroom/bathroom(new master suite on first level) Home Equipment: Walker - 2 wheels;Wheelchair - manual;Hand held shower head;Shower seat;Grab bars - toilet;Grab bars - tub/shower;Walker - 4 wheels;Bedside commode;Shower seat - built in Additional Comments: pt's family assisting with providing PLOF    Prior Function Level of Independence: Independent with assistive device(s);Needs assistance   Gait / Transfers Assistance Needed: supervision with use of RW indoors/outdoors; use of w/c for long distances  ADL's / Homemaking Assistance Needed: mod independent  Comments: using RW for mobility; uses urinal for voiding bladder at nightWife states she will hire caregivers prn     Hand Dominance   Dominant Hand: Right    Extremity/Trunk Assessment   Upper Extremity Assessment Upper Extremity Assessment: Defer to OT evaluation RUE Deficits / Details: requires active assist to move RUE through full ROM due to weakness/RUE pain; pt able to flex/extend digits given increased effort; decreased grip strength RUE Coordination: decreased fine motor;decreased gross motor    Lower Extremity Assessment Lower Extremity Assessment: Generalized weakness;RLE deficits/detail;LLE deficits/detail RLE Deficits / Details: grossly 3-/5 RLE Coordination: decreased fine motor LLE Deficits / Details: grossly 3-/5 LLE Coordination: decreased fine motor    Cervical / Trunk Assessment Cervical / Trunk Assessment: Kyphotic   Communication   Communication: HOH  Cognition Arousal/Alertness: Awake/alert Behavior During Therapy: WFL for tasks assessed/performed Overall Cognitive Status: History of cognitive impairments - at baseline                                 General Comments: pt with hx of dementia at baseline; requires increased time for processing/following one step commands, suspect partly due to pt is [facility]      General Comments      Exercises Other Exercises Other Exercises: warm up ROM exercise.   Assessment/Plan    PT Assessment Patient needs continued PT services  PT Problem List Decreased strength;Decreased activity tolerance;Decreased balance;Decreased mobility;Decreased coordination       PT Treatment Interventions Gait training;DME instruction;Functional mobility training;Therapeutic activities;Balance training;Patient/family education    PT Goals (Current goals can be found in the Care Plan section)  Acute Rehab PT Goals Patient Stated Goal: regain his strength PT Goal Formulation: With patient Time For Goal Achievement: 09/02/18 Potential to Achieve Goals: Good    Frequency Min 3X/week   Barriers to discharge        VSS during treatment Co-evaluation               AM-PAC PT "6 Clicks" Daily Activity  Outcome Measure Difficulty turning over in bed (including adjusting bedclothes, sheets and blankets)?: Unable Difficulty moving from lying on back to sitting on  the side of the bed? : Unable Difficulty sitting down on and standing up from a chair with arms (e.g., wheelchair, bedside commode, etc,.)?: Unable Help needed moving to and from a bed to chair (including a wheelchair)?: A Lot Help needed walking in hospital room?: A Lot Help needed climbing 3-5 steps with a railing? : Total 6 Click Score: 8    End of Session Equipment Utilized During Treatment: Gait belt Activity Tolerance: Patient limited by fatigue Patient left: with call bell/phone within  reach;with family/visitor present;in bed Nurse Communication: Mobility status PT Visit Diagnosis: Unsteadiness on feet (R26.81);Other abnormalities of gait and mobility (R26.89);Muscle weakness (generalized) (M62.81)    Time: 4035-2481 PT Time Calculation (min) (ACUTE ONLY): 38 min   Charges:   PT Evaluation $PT Eval Moderate Complexity: 1 Mod PT Treatments $Gait Training: 8-22 mins $Therapeutic Activity: 8-22 mins        [place] Pager:  [phone]  Office:  [place] 08/19/2018, 5:24 PM

## 2018-08-19 NOTE — Progress Notes (Signed)
  Echocardiogram 2D Echocardiogram has been performed.  William [name] F 08/19/2018, 1:48 PM

## 2018-08-19 NOTE — Consult Note (Signed)
Spoke with RN Marcene Brawn, she will [place] when potassium is complete

## 2018-08-19 NOTE — Care Management Note (Addendum)
Case Management Note  Patient Details  Name: GAWAIN CROMBIE MRN: [id number] Date of Birth: [birth year]  Subjective/Objective:   S/p TAVR                 Action/Plan: CM attempted to discuss transitional needs, with patient currently having testing at the bedside. Patient transitioned home on 08/13/18 with spouse and resumption of [place] services provided by [facility]. CM spoke to [place], [facility] liaison and confirmed patient was still active and receiving HHRN/PT/OT services PTA. Patient will need HH resumption orders and F2F. Per the previous CM Note on 10/31, 24/7 PCS services would be provided by [facility]. PT eval pending, with CM to continue to follow for PT recommendations and arrangement of services.  Expected Discharge Date:                  Expected Discharge Plan:  [place]  In-House Referral:  NA  Discharge planning Services  CM Consult  Post Acute Care Choice:  NA Choice offered to:  NA  DME Arranged:  N/A DME Agency:  NA  HH Arranged:  NA HH Agency:  NA  Status of Service:  In process, will continue to follow  If discussed at Long Length of Stay Meetings, dates discussed:    Additional Comments:  Midge Minium RN, BSN, NCM-BC, ACM-RN [phone] 08/19/2018, 12:59 PM

## 2018-08-19 NOTE — Progress Notes (Signed)
Patient arrived the unit from [facility], on a hospital bed, placed on tele ccmd notified, assessment completed see flow sheet, patient oriented to room and staff, bed in lowest position, call bell within reach will monitor.

## 2018-08-19 NOTE — Progress Notes (Signed)
1 Day Post-Op Procedure(s) (LRB): TRANSCATHETER AORTIC VALVE REPLACEMENT, TRANSFEMORAL using an Edwards 41mm Aortic Valve (N/A) TRANSESOPHAGEAL ECHOCARDIOGRAM (TEE) (N/A) Subjective: No complaints.  Objective: Vital signs in last 24 hours: Temp:  [97.3 F (36.3 C)-98.5 F (36.9 C)] 98.3 F (36.8 C) (11/06 0400) Pulse Rate:  [70-138] 86 (11/06 0600) Cardiac Rhythm: Normal sinus rhythm (11/05 2000) Resp:  [0-28] 20 (11/06 0600) BP: (84-120)/(52-76) 84/52 (11/05 1900) SpO2:  [89 %-100 %] 97 % (11/06 0600) Arterial Line BP: (83-144)/(45-65) 102/53 (11/05 1500) Weight:  [93.6 kg] 93.6 kg (11/06 0500)  Hemodynamic parameters for last 24 hours:    Intake/Output from previous day: 11/05 0701 - 11/06 0700 In: 1689.4 [I.V.:1689.4] Out: 750 [Urine:750] Intake/Output this shift: No intake/output data recorded.  General appearance: alert and cooperative Neurologic: intact Heart: regular rate and rhythm, S1, S2 normal, no murmur, click, rub or gallop Lungs: clear to auscultation bilaterally Extremities: extremities normal, atraumatic, no cyanosis or edema Wound: groin sites ok  Lab Results: Recent Labs    08/18/18 1039 08/19/18 0441  WBC  --  5.7  HGB 10.9* 10.8*  HCT 32.0* 32.1*  PLT  --  124*   BMET:  Recent Labs    08/18/18 1039 08/19/18 0441  NA 137 135  K 3.6 3.3*  CL 99 105  CO2  --  26  GLUCOSE 176* 114*  BUN 19 15  CREATININE 1.00 0.87  CALCIUM  --  8.2*    PT/INR: No results for input(s): LABPROT, INR in the last 72 hours. ABG    Component Value Date/Time   PHART 7.503 (H) 08/13/2018 0435   HCO3 24.6 08/13/2018 0435   TCO2 28 08/18/2018 1039   O2SAT 93.8 08/13/2018 0435   CBG (last 3)  No results for input(s): GLUCAP in the last 72 hours.  Assessment/Plan: S/P Procedure(s) (LRB): TRANSCATHETER AORTIC VALVE REPLACEMENT, TRANSFEMORAL using an Edwards 42mm Aortic Valve (N/A) TRANSESOPHAGEAL ECHOCARDIOGRAM (TEE) (N/A)  POD 1 Hemodynamically stable  off levophed. Will hold off restarting metoprolol at this time.  2D echo done this am. Will check that.  DC arterial line and central line.  Tylenol only for pain due to age and mild dementia.  ASA and Plavix.  Social work consult for home needs.  PT consult for mobilization. He will likely need home PT at discharge.  Transfer to 4E.   LOS: 1 day    Gaye Pollack 08/19/2018

## 2018-08-20 ENCOUNTER — Other Ambulatory Visit: Payer: Self-pay | Admitting: *Deleted

## 2018-08-20 ENCOUNTER — Encounter (HOSPITAL_COMMUNITY): Payer: Self-pay | Admitting: Cardiovascular Disease

## 2018-08-20 DIAGNOSIS — R911 Solitary pulmonary nodule: Secondary | ICD-10-CM | POA: Diagnosis not present

## 2018-08-20 DIAGNOSIS — I4891 Unspecified atrial fibrillation: Secondary | ICD-10-CM | POA: Diagnosis not present

## 2018-08-20 DIAGNOSIS — I429 Cardiomyopathy, unspecified: Secondary | ICD-10-CM | POA: Diagnosis not present

## 2018-08-20 DIAGNOSIS — I5043 Acute on chronic combined systolic (congestive) and diastolic (congestive) heart failure: Secondary | ICD-10-CM | POA: Diagnosis not present

## 2018-08-20 DIAGNOSIS — M47816 Spondylosis without myelopathy or radiculopathy, lumbar region: Secondary | ICD-10-CM | POA: Diagnosis not present

## 2018-08-20 DIAGNOSIS — I428 Other cardiomyopathies: Secondary | ICD-10-CM | POA: Diagnosis not present

## 2018-08-20 DIAGNOSIS — Z48812 Encounter for surgical aftercare following surgery on the circulatory system: Secondary | ICD-10-CM | POA: Diagnosis not present

## 2018-08-20 DIAGNOSIS — F0390 Unspecified dementia without behavioral disturbance: Secondary | ICD-10-CM | POA: Diagnosis not present

## 2018-08-20 DIAGNOSIS — I2510 Atherosclerotic heart disease of native coronary artery without angina pectoris: Secondary | ICD-10-CM | POA: Diagnosis not present

## 2018-08-20 DIAGNOSIS — I110 Hypertensive heart disease with heart failure: Secondary | ICD-10-CM | POA: Diagnosis not present

## 2018-08-20 DIAGNOSIS — I714 Abdominal aortic aneurysm, without rupture: Secondary | ICD-10-CM | POA: Diagnosis not present

## 2018-08-20 DIAGNOSIS — C61 Malignant neoplasm of prostate: Secondary | ICD-10-CM | POA: Diagnosis not present

## 2018-08-20 DIAGNOSIS — I700 Atherosclerosis of aorta: Secondary | ICD-10-CM | POA: Diagnosis not present

## 2018-08-20 LAB — POCT I-STAT, CHEM 8
BUN: 20 mg/dL (ref 8–23)
CALCIUM ION: 1.19 mmol/L (ref 1.15–1.40)
CHLORIDE: 99 mmol/L (ref 98–111)
Creatinine, Ser: 1 mg/dL (ref 0.61–1.24)
GLUCOSE: 211 mg/dL — AB (ref 70–99)
HCT: 31 % — ABNORMAL LOW (ref 39.0–52.0)
Hemoglobin: 10.5 g/dL — ABNORMAL LOW (ref 13.0–17.0)
Potassium: 3.5 mmol/L (ref 3.5–5.1)
Sodium: 137 mmol/L (ref 135–145)
TCO2: 27 mmol/L (ref 22–32)

## 2018-08-20 LAB — BASIC METABOLIC PANEL
Anion gap: 6 (ref 5–15)
BUN: 14 mg/dL (ref 8–23)
CHLORIDE: 105 mmol/L (ref 98–111)
CO2: 25 mmol/L (ref 22–32)
Calcium: 8.4 mg/dL — ABNORMAL LOW (ref 8.9–10.3)
Creatinine, Ser: 0.96 mg/dL (ref 0.61–1.24)
GFR calc Af Amer: >60 mL/min (ref >60)
GLUCOSE: 121 mg/dL — AB (ref 70–99)
POTASSIUM: 4.3 mmol/L (ref 3.5–5.1)
SODIUM: 136 mmol/L (ref 135–145)

## 2018-08-20 LAB — CBC
HCT: 31.9 % — ABNORMAL LOW (ref 39.0–52.0)
HEMOGLOBIN: 10.9 g/dL — AB (ref 13.0–17.0)
MCH: 32.9 pg (ref 26.0–34.0)
MCHC: 34.2 g/dL (ref 30.0–36.0)
MCV: 96.4 fL (ref 80.0–100.0)
PLATELETS: 110 10*3/uL — AB (ref 150–400)
RBC: 3.31 MIL/uL — ABNORMAL LOW (ref 4.22–5.81)
RDW: 15.7 % — ABNORMAL HIGH (ref 11.5–15.5)
WBC: 6 10*3/uL (ref 4.0–10.5)
nRBC: 0 % (ref 0.0–0.2)

## 2018-08-20 MED ORDER — CLOPIDOGREL BISULFATE 75 MG PO TABS: 75.0000 mg | ORAL_TABLET | Freq: Every day | ORAL | 1 refills | Status: DC

## 2018-08-20 MED ORDER — POTASSIUM CHLORIDE ER 10 MEQ PO TBCR: 10.0000 meq | EXTENDED_RELEASE_TABLET | Freq: Every day | ORAL | 6 refills | Status: DC

## 2018-08-20 MED FILL — Magnesium Sulfate Inj 50%: INTRAMUSCULAR | Qty: 10 | Status: CN

## 2018-08-20 MED FILL — Potassium Chloride Inj 2 mEq/ML: INTRAVENOUS | Qty: 40 | Status: CN

## 2018-08-20 MED FILL — Heparin Sodium (Porcine) Inj 1000 Unit/ML: INTRAMUSCULAR | Qty: 30 | Status: CN

## 2018-08-20 NOTE — Patient Outreach (Signed)
Made aware of [facility] First enrollment by [facility] with Alvis Lemmings.  Will send notification to [place] Management office to make aware of [facility] First enrollment.    Marthenia Rolling, MSN-Ed, RN,BSN [facility] Liaison [phone]

## 2018-08-20 NOTE — Discharge Instructions (Signed)

## 2018-08-20 NOTE — Discharge Summary (Addendum)
[place] VALVE TEAM  Discharge Summary    Patient ID: William [name] MRN: [id number]; DOB: 1928-01-02  Admit date: 08/18/2018 Discharge date: 08/20/2018  Primary Care Provider: Haywood Pao, MD  Primary Cardiologist: Sanda Klein, MD / Dr. Angelena Form & Dr. Cyndia Bent (TAVR)  Discharge Diagnoses    Principal Problem:   S/P TAVR (transcatheter aortic valve replacement) Active Problems:   Nonischemic cardiomyopathy ([place])   Hyperlipidemia   AAA (abdominal aortic aneurysm) (HCC)   S/P ICD (internal cardiac defibrillator) procedure, 01/11/14 removal of ERI gen and placement of Medtronic Evera XT VR & NEW Right ventricular lead Medtronic   Prostate cancer ([place])   Acute on chronic systolic heart failure (HCC)   Severe aortic stenosis   Ventricular tachycardia (HCC)   Dementia (HCC)   Allergies Allergies  Allergen Reactions  . Crestor [Rosuvastatin] Other (See Comments)    Hurts muscles  . Lipitor [Atorvastatin] Other (See Comments)    Hurts stomach    Diagnostic Studies/Procedures     [facility]   MULTIDISCIPLINARY HEART VALVE TEAM  TAVR OPERATIVE NOTE   Date of Procedure:                08/18/2018  Preoperative Diagnosis:      Severe Aortic Stenosis  Procedure:        Transcatheter Aortic Valve Replacement - Percutaneous Right Transfemoral Approach             Edwards Sapien 3 THV (size 29 mm, model # 9600TFX, serial # [id number])              Co-Surgeons:                        Gaye Pollack, MD and Lauree Chandler, MD  Pre-operative Echo Findings: ? Severe aortic stenosis ? Moderate left ventricular systolic function  Post-operative Echo Findings: ? No paravalvular leak ? Moderate left ventricular systolic function  _____________    Echo 08/19/18 Study Conclusions - Left ventricle: The cavity size was normal. Wall thickness was   increased in a pattern of mild LVH. The estimated  ejection   fraction was 30%. Diffuse hypokinesis. Doppler parameters are   consistent with abnormal left ventricular relaxation (grade 1   diastolic dysfunction). - Aortic valve: Bioprosthetic aortic valve s/p TAVR. No significant   bioprosthetic aortic valve stenosis. There was no regurgitation.   Mean gradient (S): 9 mm Hg. Valve area (VTI): 2.65 cm^2. - Aorta: Mildly dilated aortic root. Aortic root dimension: 40 mm   (ED). - Mitral valve: Mildly calcified annulus. Mildly calcified leaflets   . There was mild regurgitation. - Left atrium: The atrium was moderately dilated. - Right ventricle: The cavity size was normal. Pacer wire or   catheter noted in right ventricle. Systolic function was normal. - Right atrium: The atrium was mildly dilated. - Tricuspid valve: Peak RV-RA gradient (S): 33 mm Hg. - Systemic veins: IVC not visualized. Impressions: - Normal LV size with mild LV hypertrophy. EF 30%, diffuse   hypokinesis. Normal RV size and systolic function. Mild mitral   regurgitation. Bioprosthetic aortic valve s/p TAVR, no   significant bioprosthetic valve stenosis and no aortic   insufficiency.   History of Present Illness     William [name] is a 84 y.o. male with a history of AAA, NICM/chronic systolic CHF s/p ICD, ventricular tachycardia, OSA, HLD, degenerative arthritis/lumbar vertebral fracture with reduced mobility and  severe AS who presented to [facility] on [facility]18 for planned TAVR.  The patient has been followed by Dr. Sallyanne Kuster. He was recently admitted with progressive worsening shortness of breath and lower extremity edema over several weeks duration. He was treated with diuresis with significant improvement in his lower extremity edema as well as his breathing. HIs most recent echocardiogram on 07/03/2018 showed a mean gradient across aortic valve of 21 mmHg. Peak gradient was 53 mmHg and dimensionless index was 0.16. Aortic valve area was 0.66 cm. Left ventricular ejection  fraction was 20 to 25%. He has had progressive dyspnea with low-level exertion. He is not very active due to a previous fall down the stairs at home about 5 years ago where he broke his back. He does ambulate some in his house using a walker and if he gets out and has to go any distance he uses a wheelchair. He still goes out to eat at restaurants several times per week and works in his business. He was seen by Dr. Angelena Form for consideration of transcatheter aortic valve replacement. Cardiac catheterization was performed on 07/06/2018 which showed mild nonobstructive coronary disease.He was seen by Dr. Cyndia Bent in consultation on 10/22 and felt to be a candidate for TAVR. He was scheduled for TAVR on 08/11/2018 but unfortunately was admitted with urosepsis on 08/07/2018. Urine culture grew klebsiella and he was treated with antibiotics and fluid. TAVR was postponed. He made a good recovery and was discharged on 08/13/2018 to return for TAVR on 08/18/2018.   Hospital Course     Consultants: none  Severe AS: s/p successful TAVR with a 29 mm Edwards Sapien 3 THV via the TF approach on 08/18/18. Post operative echo showed EF 30%, normally functioning TAVR with no PVL; mean gradient of 9 mm Hg. Groin sites are stable. ECG with 1st deg AV block, RBBB and LAF hemiblock but no HAVB, he has an ICD in placed. Continue Asprin and plavix.   HTN: BP has been low post operatively. Continue to hold home Toprol XL 50mg  daily. This can be added back as an outpatient   Hx of ventricular tachycardia: continue amio. ICD in place  Acute on chronic combined S/D CHF: as evidenced by an elevated BNP on preadmission labs and progressive symptoms of SOB and decreased exercise tolerance. This has been treated with TAVR. I will resume home lasix 20mg  at discharge.   AAA: infrarenal AAA 4.4 cm. Follow up ultrasound recommended in 1 year  Pulmonary nodules: multiple small pulmonary nodules noted throughout the lungs bilaterally.  Incidental findings on pre TAVR CT. 6 month follow up recommended.   Debilitation: he will go home with HHPT _____________  Discharge Vitals Blood pressure 105/76, pulse 88, temperature 98.6 F (37 C), temperature source Oral, resp. rate 16, height 5\' 10"  (1.778 m), weight 93.6 kg, SpO2 99 %.  Filed Weights   08/18/18 0640 08/19/18 0500 08/20/18 0500  Weight: 101.2 kg 93.6 kg 93.6 kg    GEN: Well nourished, well developed, in no acute distress HEENT: normal Neck: no JVD or masses Cardiac: RRR; no murmurs, rubs, or gallops, trace pretibial edema Respiratory:  clear to auscultation bilaterally, normal work of breathing GI: soft, nontender, nondistended, + BS MS: no deformity or atrophy Skin: warm and dry, no rash. Groin sites with mild hematoma on right but soft  Neuro:  Alert and Oriented x 3, Strength and sensation are intact Psych: euthymic mood, full affect   Labs & Radiologic Studies    CBC Recent  Labs    08/19/18 0441 08/20/18 0320  WBC 5.7 6.0  HGB 10.8* 10.9*  HCT 32.1* 31.9*  MCV 96.7 96.4  PLT 124* 789*   Basic Metabolic Panel Recent Labs    08/19/18 0441 08/20/18 0320  NA 135 136  K 3.3* 4.3  CL 105 105  CO2 26 25  GLUCOSE 114* 121*  BUN 15 14  CREATININE 0.87 0.96  CALCIUM 8.2* 8.4*  MG 2.4  --    Liver Function Tests No results for input(s): AST, ALT, ALKPHOS, BILITOT, PROT, ALBUMIN in the last 72 hours. No results for input(s): LIPASE, AMYLASE in the last 72 hours. Cardiac Enzymes No results for input(s): CKTOTAL, CKMB, CKMBINDEX, TROPONINI in the last 72 hours. BNP Invalid input(s): POCBNP D-Dimer No results for input(s): DDIMER in the last 72 hours. Hemoglobin A1C No results for input(s): HGBA1C in the last 72 hours. Fasting Lipid Panel No results for input(s): CHOL, HDL, LDLCALC, TRIG, CHOLHDL, LDLDIRECT in the last 72 hours. Thyroid Function Tests No results for input(s): TSH, T4TOTAL, T3FREE, THYROIDAB in the last 72  hours.  Invalid input(s): FREET3 _____________  Dg Chest 2 View  Result Date: 08/13/2018 CLINICAL DATA:  Preoperative evaluation. EXAM: CHEST - 2 VIEW COMPARISON:  08/08/2018.  08/07/2018. FINDINGS: Cardiac pacer with lead tips over the right ventricle. Cardiomegaly with mild pulmonary venous congestion and bilateral pulmonary interstitial prominence suggesting congestive heart failure. Small bilateral pleural effusions. Interim partial clearing from prior exam. IMPRESSION: 1.  Cardiac pacer with lead tips over the right ventricle. 2.  Interim partial clearing of CHF. Electronically Signed   By: Marcello Moores  Register   On: 08/13/2018 11:11   Dg Chest 2 View  Result Date: 08/07/2018 CLINICAL DATA:  Possible sepsis. EXAM: CHEST - 2 VIEW COMPARISON:  Chest radiograph July 03, 2018 FINDINGS: Stable cardiomegaly. Calcified aortic arch. LEFT AICD in situ. Mild bronchitic changes. Blunted LEFT costophrenic angle with strandy densities LEFT lung base. No pneumothorax. Soft tissue planes and included osseous structures are non suspicious. IMPRESSION: 1. Stable cardiomegaly. 2. Small LEFT pleural effusion with LEFT lung base atelectasis/scarring. 3.  Aortic Atherosclerosis (ICD10-I70.0). Electronically Signed   By: Elon Alas M.D.   On: 08/07/2018 06:34   Dg Elbow 2 Views Right  Result Date: 08/09/2018 CLINICAL DATA:  Extreme dementia.  Concern for elbow injury. EXAM: RIGHT ELBOW - 2 VIEW COMPARISON:  None FINDINGS: Examination is markedly degraded secondary to patient's inability to tolerate conventional positioning four elbow radiographic series. No definite fracture or elbow joint effusion. The elbow is held in persistent flexion. Minimal enthesopathic change involving the medial aspect of the ulna. Joint spaces appear preserved. And IV is seen within the antecubital fossa. Adjacent vascular calcifications. No radiopaque foreign body. IMPRESSION: Degraded examination without definite fracture or  elbow joint effusion. Electronically Signed   By: Sandi Mariscal M.D.   On: 08/09/2018 20:04   Dg Wrist 2 Views Right  Result Date: 08/09/2018 CLINICAL DATA:  Pain. EXAM: RIGHT WRIST - 2 VIEW COMPARISON:  None. FINDINGS: Vascular calcifications are noted. The distal ulna appears to be displaced/dislocated in a dorsal direction. There is an associated fracture seen on the lateral view. The radiocarpal joint remains intact. No definitive radius fracture noted. Carpal bones demonstrate no obvious fracture. There is severe soft tissue swelling over the distal ulna. The distal ulna also overlaps the lunate and trapezium which is abnormal. IMPRESSION: Dislocation of the distal ulna in a dorsal direction with associated fractures. Associated soft tissue swelling. Electronically  Signed   By: Dorise Bullion III M.D   On: 08/09/2018 18:09   Ct Coronary Morph W/cta Cor Nancy Fetter W/ca W/cm &/or Wo/cm  Addendum Date: 08/03/2018   ADDENDUM REPORT: 08/03/2018 16:16 CLINICAL DATA:  Aortic stenosis EXAM: Cardiac TAVR CT TECHNIQUE: The patient was scanned on a Siemens Force 782 slice scanner. A 120 kV retrospective scan was triggered in the descending thoracic aorta at 111 HU's. Gantry rotation speed was 270 msecs and collimation was .9 mm. No beta blockade or nitro were given. The 3D data set was reconstructed in 5% intervals of the R-R cycle. Systolic and diastolic phases were analyzed on a dedicated work station using MPR, MIP and VRT modes. The patient received 80 cc of contrast. FINDINGS: Aortic Valve: Trileaflet calcified with restricted leaflet motion Aorta: Moderate calcific atherosclerosis. The is a large? Penetrating ulcer or bulky plaque in the descending thoracic aorta just distal to the left subclavian take off that would likely preclude transfemoral approach Sinotubular Junction: 31 mm Ascending Thoracic Aorta: 35 mm Aortic Arch: 33 mm Descending Thoracic Aorta: 30 mm Sinus of Valsalva Measurements: Non-coronary:  35 mm Right - coronary: 31 mm Left - coronary: 33 mm Coronary Artery Height above Annulus: Left Main: 12.6 mm above annulus Right Coronary: 17 mm above annulus Virtual Basal Annulus Measurements: Maximum/Minimum Diameter: 27.7 mm x 31.1 mm Perimeter: 93 mm Area: 699 mm2 Coronary Arteries: Sufficient height for deployment Optimum Fluoroscopic Angle for Delivery: LAO 2 Caudal 10 degrees IMPRESSION: 1. Trileaflet AV with annular area 699 mm2 which is upper normal for 29 mm Sapien 3 or suitable for 34 mm Evolute R 2. Optimum angiographic angle for deployment RAO 2 degrees Caudal 10 degrees 3.  Coronary arteries sufficient height above annulus for deployment 4. Lage bulky mixed plaque/ penetrating ulcer in aorta just distal to take off of left subclavian that would likely preclude trans femoral approach with high embolic risk [place] Electronically Signed   By: Jenkins Rouge M.D.   On: 08/03/2018 16:16   Result Date: 08/03/2018 EXAM: OVER-READ INTERPRETATION  CT CHEST The following report is an over-read performed by radiologist Dr. Vinnie Langton of [facility], [place] on 08/03/2018. This over-read does not include interpretation of cardiac or coronary anatomy or pathology. The coronary CTA interpretation by the cardiologist is attached. COMPARISON:  None. FINDINGS: Extracardiac findings will be described separately under dictation for contemporaneously obtained CTA chest, abdomen and pelvis. IMPRESSION: Please see separate dictation for contemporaneously obtained CTA chest, abdomen and pelvis dated 08/03/2018 for full description of relevant extracardiac findings. Electronically Signed: By: Vinnie Langton M.D. On: 08/03/2018 15:49   Dg Chest Port 1 View  Result Date: 08/18/2018 CLINICAL DATA:  84 year old male status post TAVR postoperative day zero. EXAM: PORTABLE CHEST 1 VIEW COMPARISON:  08/13/2018 chest radiographs and earlier. FINDINGS: Portable AP semi upright view at 1614 hours. New aortic  valve region prosthesis. Right IJ central line in place with tip at the lower SVC level. Stable left chest cardiac AICD. Stable cardiomegaly and mediastinal contours. Calcified aortic atherosclerosis. Stable lung volumes and pulmonary vascularity without overt edema. No pneumothorax or pleural effusion. Paucity of bowel gas in the upper abdomen. IMPRESSION: 1. Status post TAVR with no acute cardiopulmonary abnormality. 2. Right IJ central line in place with tip at the lower SVC level. Electronically Signed   By: Genevie Ann M.D.   On: 08/18/2018 16:48   Dg Chest Port 1 View  Result Date: 08/08/2018 CLINICAL DATA:  Respiratory failure EXAM:  PORTABLE CHEST 1 VIEW COMPARISON:  August 07, 2018 FINDINGS: There is persistent cardiomegaly. Pacemaker leads are attached to the right heart, stable. There is pulmonary venous hypertension. There is interstitial pulmonary edema with small pleural effusions bilaterally. There is no consolidation. There is aortic atherosclerosis. No evident bone lesions. IMPRESSION: Cardiomegaly with pulmonary vascular congestion. Small pleural effusions with interstitial edema. Suspect congestive heart failure. No consolidation. There is aortic atherosclerosis. Aortic Atherosclerosis (ICD10-I70.0). Electronically Signed   By: Lowella Grip III M.D.   On: 08/08/2018 01:09   Ct Angio Chest Aorta W &/or Wo Contrast  Result Date: 08/03/2018 CLINICAL DATA:  84 year old male with history of severe aortic stenosis. Preprocedural study for potential transcatheter aortic valve replacement (TAVR) procedure. EXAM: CT ANGIOGRAPHY CHEST, ABDOMEN AND PELVIS TECHNIQUE: Multidetector CT imaging through the chest, abdomen and pelvis was performed using the standard protocol during bolus administration of intravenous contrast. Multiplanar reconstructed images and MIPs were obtained and reviewed to evaluate the vascular anatomy. CONTRAST:  140mL ISOVUE-370 IOPAMIDOL (ISOVUE-370) INJECTION 76% COMPARISON:   CT the abdomen and pelvis 01/23/2015. No prior chest CT. FINDINGS: CTA CHEST FINDINGS Cardiovascular: Heart size is enlarged with left atrial dilatation. There is no significant pericardial fluid, thickening or pericardial calcification. There is aortic atherosclerosis, as well as atherosclerosis of the great vessels of the mediastinum and the coronary arteries, including calcified atherosclerotic plaque in the left main, left anterior descending, left circumflex and right coronary arteries. Severe thickening calcification of the aortic valve. Thickening calcification of the mitral valve. Left-sided pacemaker/AICD with leads terminating in the apex of the right ventricle. Mediastinum/Lymph Nodes: No pathologically enlarged mediastinal or hilar lymph nodes. Esophagus is unremarkable in appearance. No axillary lymphadenopathy. Lungs/Pleura: Several small pulmonary nodules are noted in the lungs bilaterally, the largest of which measures 6 mm in the periphery of the left lower lobe (axial image 51 of series 15). No larger more suspicious appearing pulmonary nodules or masses are noted. No acute consolidative airspace disease. No pleural effusions. Areas of scarring are noted in the lung bases bilaterally. Musculoskeletal/Soft Tissues: There are no aggressive appearing lytic or blastic lesions noted in the visualized portions of the skeleton. CTA ABDOMEN AND PELVIS FINDINGS Hepatobiliary: Diffuse low attenuation throughout the hepatic parenchyma, indicative of hepatic steatosis. No suspicious cystic or solid hepatic lesions. No intra or extrahepatic biliary ductal dilatation. Gallbladder lumen is filled with innumerable tiny calcified gallstones. No surrounding inflammatory changes to suggest an acute cholecystitis at this time. Pancreas: No pancreatic mass. Multifocal pancreatic ductal ectasia. Diffuse atrophy throughout the body and tail of the pancreas. Several small low-attenuation lesions throughout the body of  the pancreas, largest of which measures only 1.4 x 0.8 cm (axial image 98 of series 14), minimally increased in retrospect to prior study from 01/23/2015. No peripancreatic fluid collections or inflammatory changes. Spleen: Unremarkable. Adrenals/Urinary Tract: Multiple low-attenuation nonenhancing lesions in both kidneys, compatible with simple cysts, measuring up to 4.5 cm in the upper pole of the left kidney. Bilateral adrenal glands are normal in appearance. No hydroureteronephrosis. Urinary bladder is largely decompressed. Urinary bladder wall appears thickened (this could be accentuated by decompression). Stomach/Bowel: Normal appearance of the stomach. No pathologic dilatation of small bowel or colon. Numerous colonic diverticulae are noted, particularly in the sigmoid colon, without surrounding inflammatory changes to suggest an acute diverticulitis at this time. Postoperative changes of right hemicolectomy. Vascular/Lymphatic: Aortic atherosclerosis, with fusiform infrarenal abdominal aortic aneurysm measuring up to 4.4 x 4.4 cm in diameter. No dissection noted in the  abdominal or pelvic vasculature. Vascular findings and measurements pertinent to potential TAVR procedure, as detailed below. No pathologically enlarged lymph nodes are noted in the abdomen or pelvis. Reproductive: Prostate gland and seminal vesicles are unremarkable in appearance. Other: No significant volume of ascites.  No pneumoperitoneum. Musculoskeletal: Chronic appearing compression fracture of L1 with 90% loss of anterior vertebral body height. There are no aggressive appearing lytic or blastic lesions noted in the visualized portions of the skeleton. VASCULAR MEASUREMENTS PERTINENT TO TAVR: AORTA: Minimal Aortic Diameter-26 x 24 mm Severity of Aortic Calcification-severe RIGHT PELVIS: Right Common Iliac Artery - Minimal Diameter-14.6 x 11.3 mm Tortuosity-moderate Calcification-moderate to severe Right External Iliac Artery - Minimal  Diameter-10.7 x 10.0 mm Tortuosity-severe Calcification-mild Right Common Femoral Artery - Minimal Diameter-10.3 x 10.4 mm Tortuosity-mild Calcification-mild-to-moderate LEFT PELVIS: Left Common Iliac Artery - Minimal Diameter-14.1 x 12.4 mm Tortuosity-moderate Calcification-moderate to severe Left External Iliac Artery - Minimal Diameter-10.7 x 9.4 mm Tortuosity-moderate to severe Calcification-mild Left Common Femoral Artery - Minimal Diameter-11.0 x 11.0 mm Tortuosity-mild Calcification-moderate Review of the MIP images confirms the above findings. IMPRESSION: 1. Vascular findings and measurements pertinent to potential TAVR procedure, as detailed above. 2. Severe thickening calcification of the aortic valve, compatible with reported clinical history of severe aortic stenosis. 3. Multiple small pulmonary nodules noted throughout the lungs bilaterally, largest of which measures only 6 mm in the periphery of the left lower lobe (axial image 51 of series 15). Non-contrast chest CT at 6 months is recommended. If the nodules are stable at time of repeat CT, then future CT at 18-24 months (from today's scan) is considered optional for low-risk patients, but is recommended for high-risk patients. This recommendation follows the consensus statement: Guidelines for Management of Incidental Pulmonary Nodules Detected on CT Images: From the Fleischner Society 2017; Radiology 2017; 284:228-243. 4. Several small low-attenuation lesions are noted in the body of the pancreas, largest of which measures only 1.4 x 0.8 cm, relatively similar to remote prior study from 2016. These are statistically likely benign given the patient's age and their behavior. If further evaluation is desired, repeat pancreatic protocol CT could be considered in 2 years to confirm continued stability. This recommendation follows ACR consensus guidelines: Management of Incidental Pancreatic Cysts: A White Paper of the ACR Incidental Findings Committee. J  Am Coll Radiol 1829;93:716-967. 5. Aortic atherosclerosis, in addition to left main and 3 vessel coronary artery disease. In addition, there is fusiform aneurysmal dilatation of the infrarenal abdominal aorta which measures up to 4.4 cm in diameter. Recommend followup by ultrasound in 1 year. This recommendation follows ACR consensus guidelines: White Paper of the ACR Incidental Findings Committee II on Vascular Findings. J Am Coll Radiol 2013; 10:789-794. 6. Cholelithiasis without evidence of acute cholecystitis at this time. 7. Circumferential thickening of the urinary bladder wall. Although this could simply be related to under distention of the urinary bladder, even accounting for the decompression of the urinary bladder, the wall appears diffusely thickened. Outpatient referral to Urology could be considered for further evaluation if clinically appropriate. 8. Colonic diverticulosis without evidence of acute diverticulitis at this time. 9. Additional incidental findings, as above. Electronically Signed   By: Vinnie Langton M.D.   On: 08/03/2018 17:00   Vas US Carotid  Result Date: 08/03/2018 Carotid Arterial Duplex Study Indications: Pre-surgical evaluation. Performing Technologist: Maudry Mayhew MHA, RDMS, RVT, RDCS  Examination Guidelines: A complete evaluation includes B-mode imaging, spectral Doppler, color Doppler, and power Doppler as needed of all accessible portions  of each vessel. Bilateral testing is considered an integral part of a complete examination. Limited examinations for reoccurring indications may be performed as noted.  Right Carotid Findings: +----------+--------+--------+--------+--------+--------+           PSV cm/sEDV cm/sStenosisDescribeComments +----------+--------+--------+--------+--------+--------+ CCA Prox  59      10                               +----------+--------+--------+--------+--------+--------+ CCA Distal72      17                                +----------+--------+--------+--------+--------+--------+ ICA Prox  29      11                               +----------+--------+--------+--------+--------+--------+ ICA Distal37      11                               +----------+--------+--------+--------+--------+--------+ ECA       29      5                                +----------+--------+--------+--------+--------+--------+ +----------+--------+-------+----------------+-------------------+           PSV cm/sEDV cmsDescribe        Arm Pressure (mmHG) +----------+--------+-------+----------------+-------------------+ [id number]             Multiphasic, WNL                    +----------+--------+-------+----------------+-------------------+ +---------+--------+--+--------+-+---------+ VertebralPSV cm/s37EDV cm/s9Antegrade +---------+--------+--+--------+-+---------+  Left Carotid Findings: +----------+--------+--------+--------+----------------------+--------+           PSV cm/sEDV cm/sStenosisDescribe              Comments +----------+--------+--------+--------+----------------------+--------+ CCA Prox  84      12                                             +----------+--------+--------+--------+----------------------+--------+ CCA Distal92      21                                             +----------+--------+--------+--------+----------------------+--------+ ICA Prox  55      19              homogeneous and smooth         +----------+--------+--------+--------+----------------------+--------+ ICA Distal65      33                                             +----------+--------+--------+--------+----------------------+--------+ ECA       63      6                                              +----------+--------+--------+--------+----------------------+--------+ +----------+--------+--------+----------------+-------------------+  SubclavianPSV cm/sEDV cm/sDescribe         Arm Pressure (mmHG) +----------+--------+--------+----------------+-------------------+           32              Multiphasic, WNL                    +----------+--------+--------+----------------+-------------------+ +---------+--------+--+--------+-+---------+ VertebralPSV cm/s30EDV cm/s4Antegrade +---------+--------+--+--------+-+---------+  Summary: Right Carotid: Velocities in the right ICA are consistent with a 1-39% stenosis. Left Carotid: Velocities in the left ICA are consistent with a 1-39% stenosis. Vertebrals:  Bilateral vertebral arteries demonstrate antegrade flow. Subclavians: Normal flow hemodynamics were seen in bilateral subclavian              arteries. *See table(s) above for measurements and observations.  Electronically signed by Ruta Hinds MD on 08/03/2018 at 8:37:19 PM.    Final    Ct Angio Abd/pel W/ And/or W/o  Result Date: 08/03/2018 CLINICAL DATA:  84 year old male with history of severe aortic stenosis. Preprocedural study for potential transcatheter aortic valve replacement (TAVR) procedure. EXAM: CT ANGIOGRAPHY CHEST, ABDOMEN AND PELVIS TECHNIQUE: Multidetector CT imaging through the chest, abdomen and pelvis was performed using the standard protocol during bolus administration of intravenous contrast. Multiplanar reconstructed images and MIPs were obtained and reviewed to evaluate the vascular anatomy. CONTRAST:  110mL ISOVUE-370 IOPAMIDOL (ISOVUE-370) INJECTION 76% COMPARISON:  CT the abdomen and pelvis 01/23/2015. No prior chest CT. FINDINGS: CTA CHEST FINDINGS Cardiovascular: Heart size is enlarged with left atrial dilatation. There is no significant pericardial fluid, thickening or pericardial calcification. There is aortic atherosclerosis, as well as atherosclerosis of the great vessels of the mediastinum and the coronary arteries, including calcified atherosclerotic plaque in the left main, left anterior descending, left circumflex and right coronary  arteries. Severe thickening calcification of the aortic valve. Thickening calcification of the mitral valve. Left-sided pacemaker/AICD with leads terminating in the apex of the right ventricle. Mediastinum/Lymph Nodes: No pathologically enlarged mediastinal or hilar lymph nodes. Esophagus is unremarkable in appearance. No axillary lymphadenopathy. Lungs/Pleura: Several small pulmonary nodules are noted in the lungs bilaterally, the largest of which measures 6 mm in the periphery of the left lower lobe (axial image 51 of series 15). No larger more suspicious appearing pulmonary nodules or masses are noted. No acute consolidative airspace disease. No pleural effusions. Areas of scarring are noted in the lung bases bilaterally. Musculoskeletal/Soft Tissues: There are no aggressive appearing lytic or blastic lesions noted in the visualized portions of the skeleton. CTA ABDOMEN AND PELVIS FINDINGS Hepatobiliary: Diffuse low attenuation throughout the hepatic parenchyma, indicative of hepatic steatosis. No suspicious cystic or solid hepatic lesions. No intra or extrahepatic biliary ductal dilatation. Gallbladder lumen is filled with innumerable tiny calcified gallstones. No surrounding inflammatory changes to suggest an acute cholecystitis at this time. Pancreas: No pancreatic mass. Multifocal pancreatic ductal ectasia. Diffuse atrophy throughout the body and tail of the pancreas. Several small low-attenuation lesions throughout the body of the pancreas, largest of which measures only 1.4 x 0.8 cm (axial image 98 of series 14), minimally increased in retrospect to prior study from 01/23/2015. No peripancreatic fluid collections or inflammatory changes. Spleen: Unremarkable. Adrenals/Urinary Tract: Multiple low-attenuation nonenhancing lesions in both kidneys, compatible with simple cysts, measuring up to 4.5 cm in the upper pole of the left kidney. Bilateral adrenal glands are normal in appearance. No  hydroureteronephrosis. Urinary bladder is largely decompressed. Urinary bladder wall appears thickened (this could be accentuated by decompression). Stomach/Bowel: Normal appearance of the stomach. No  pathologic dilatation of small bowel or colon. Numerous colonic diverticulae are noted, particularly in the sigmoid colon, without surrounding inflammatory changes to suggest an acute diverticulitis at this time. Postoperative changes of right hemicolectomy. Vascular/Lymphatic: Aortic atherosclerosis, with fusiform infrarenal abdominal aortic aneurysm measuring up to 4.4 x 4.4 cm in diameter. No dissection noted in the abdominal or pelvic vasculature. Vascular findings and measurements pertinent to potential TAVR procedure, as detailed below. No pathologically enlarged lymph nodes are noted in the abdomen or pelvis. Reproductive: Prostate gland and seminal vesicles are unremarkable in appearance. Other: No significant volume of ascites.  No pneumoperitoneum. Musculoskeletal: Chronic appearing compression fracture of L1 with 90% loss of anterior vertebral body height. There are no aggressive appearing lytic or blastic lesions noted in the visualized portions of the skeleton. VASCULAR MEASUREMENTS PERTINENT TO TAVR: AORTA: Minimal Aortic Diameter-26 x 24 mm Severity of Aortic Calcification-severe RIGHT PELVIS: Right Common Iliac Artery - Minimal Diameter-14.6 x 11.3 mm Tortuosity-moderate Calcification-moderate to severe Right External Iliac Artery - Minimal Diameter-10.7 x 10.0 mm Tortuosity-severe Calcification-mild Right Common Femoral Artery - Minimal Diameter-10.3 x 10.4 mm Tortuosity-mild Calcification-mild-to-moderate LEFT PELVIS: Left Common Iliac Artery - Minimal Diameter-14.1 x 12.4 mm Tortuosity-moderate Calcification-moderate to severe Left External Iliac Artery - Minimal Diameter-10.7 x 9.4 mm Tortuosity-moderate to severe Calcification-mild Left Common Femoral Artery - Minimal Diameter-11.0 x 11.0 mm  Tortuosity-mild Calcification-moderate Review of the MIP images confirms the above findings. IMPRESSION: 1. Vascular findings and measurements pertinent to potential TAVR procedure, as detailed above. 2. Severe thickening calcification of the aortic valve, compatible with reported clinical history of severe aortic stenosis. 3. Multiple small pulmonary nodules noted throughout the lungs bilaterally, largest of which measures only 6 mm in the periphery of the left lower lobe (axial image 51 of series 15). Non-contrast chest CT at 6 months is recommended. If the nodules are stable at time of repeat CT, then future CT at 18-24 months (from today's scan) is considered optional for low-risk patients, but is recommended for high-risk patients. This recommendation follows the consensus statement: Guidelines for Management of Incidental Pulmonary Nodules Detected on CT Images: From the Fleischner Society 2017; Radiology 2017; 284:228-243. 4. Several small low-attenuation lesions are noted in the body of the pancreas, largest of which measures only 1.4 x 0.8 cm, relatively similar to remote prior study from 2016. These are statistically likely benign given the patient's age and their behavior. If further evaluation is desired, repeat pancreatic protocol CT could be considered in 2 years to confirm continued stability. This recommendation follows ACR consensus guidelines: Management of Incidental Pancreatic Cysts: A White Paper of the ACR Incidental Findings Committee. J Am Coll Radiol 2426;83:419-622. 5. Aortic atherosclerosis, in addition to left main and 3 vessel coronary artery disease. In addition, there is fusiform aneurysmal dilatation of the infrarenal abdominal aorta which measures up to 4.4 cm in diameter. Recommend followup by ultrasound in 1 year. This recommendation follows ACR consensus guidelines: White Paper of the ACR Incidental Findings Committee II on Vascular Findings. J Am Coll Radiol 2013; 10:789-794. 6.  Cholelithiasis without evidence of acute cholecystitis at this time. 7. Circumferential thickening of the urinary bladder wall. Although this could simply be related to under distention of the urinary bladder, even accounting for the decompression of the urinary bladder, the wall appears diffusely thickened. Outpatient referral to Urology could be considered for further evaluation if clinically appropriate. 8. Colonic diverticulosis without evidence of acute diverticulitis at this time. 9. Additional incidental findings, as above. Electronically Signed  By: Vinnie Langton M.D.   On: 08/03/2018 17:00   Disposition   Pt is being discharged home today in good condition.  Follow-up Plans & Appointments    Follow-up Information    Eileen Stanford, PA-C. Go on 08/27/2018.   Specialties:  Cardiology, Radiology Why:  @ 2:30pm, please arrive at least 10 minutes early Contact information: [place] [address] [phone]            Discharge Medications   Allergies as of 08/20/2018      Reactions   Crestor [rosuvastatin] Other (See Comments)   Hurts muscles   Lipitor [atorvastatin] Other (See Comments)   Hurts stomach      Medication List    STOP taking these medications   metoprolol succinate 50 MG 24 hr tablet Commonly known as:  TOPROL-XL     TAKE these medications   acetaminophen 500 MG tablet Commonly known as:  TYLENOL Take 500 mg by mouth every 6 (six) hours as needed (for pain).   amiodarone 200 MG tablet Commonly known as:  PACERONE TAKE 1/2 TABLET BY MOUTH EVERY DAY   aspirin 81 MG chewable tablet Chew 1 tablet (81 mg total) by mouth daily.   cholecalciferol 1000 units tablet Commonly known as:  VITAMIN D Take 2,000 Units by mouth at bedtime.   clopidogrel 75 MG tablet Commonly known as:  PLAVIX Take 1 tablet (75 mg total) by mouth daily with breakfast. Start taking on:  08/21/2018   ezetimibe-simvastatin 10-20 MG  tablet Commonly known as:  VYTORIN Take 1 tablet by mouth at bedtime.   furosemide 20 MG tablet Commonly known as:  LASIX Take 1 tablet (20 mg total) by mouth daily.   polyethylene glycol packet Commonly known as:  MIRALAX / GLYCOLAX Take 17 g by mouth every evening.   potassium chloride 10 MEQ tablet Commonly known as:  K-DUR Take 1 tablet (10 mEq total) by mouth daily. What changed:    how much to take  how to take this  when to take this  additional instructions   PRESERVISION AREDS 2 PO Take 1 tablet by mouth daily.   triamcinolone 0.1 % cream : eucerin Crea Apply 1 application topically as needed for rash or itching.         Outstanding Labs/Studies   none  Duration of Discharge Encounter   Greater than 30 minutes including physician time.  SignedAngelena Form, PA-C 08/20/2018, 8:53 AM [phone]

## 2018-08-20 NOTE — Progress Notes (Signed)
Based on pt progress with PT, pt is n/a for CR purposes. Will not see. Yves Dill CES, ACSM 10:11 AM 08/20/2018

## 2018-08-20 NOTE — Care Management Note (Signed)
Case Management Note Initial CM note completed by Midge Minium RN, BSN, NCM-BC, ACM-RN [phone] 08/19/2018, 12:59 PM  Patient Details  Name: William [name] MRN: [id number] Date of Birth: [birth year]  Subjective/Objective:   S/p TAVR                 Action/Plan: CM attempted to discuss transitional needs, with patient currently having testing at the bedside. Patient transitioned home on 08/13/18 with spouse and resumption of [place] services provided by [facility]. CM spoke to [place], [facility] liaison and confirmed patient was still active and receiving HHRN/PT/OT services PTA. Patient will need HH resumption orders and F2F. Per the previous CM Note on 10/31, 24/7 PCS services would be provided by [facility]. PT eval pending, with CM to continue to follow for PT recommendations and arrangement of services.  Expected Discharge Date:  08/20/18               Expected Discharge Plan:  [place]  In-House Referral:  NA  Discharge planning Services  CM Consult  Post Acute Care Choice:  Home Health Choice offered to:  Spouse, Adult Children  DME Arranged:  N/A DME Agency:  NA  HH Arranged:  RN, PT [place] Agency:  [facility]  Status of Service:  Completed, signed off  If discussed at [place] of Stay Meetings, dates discussed:    Discharge Disposition: home/home health   Additional Comments:  08/20/18- 1000- Marvetta Gibbons RN, CM- pt ready for transition home today- spoke with daughter and spouse at bedside to confirm transition plan- they have decided to go with [facility] First program, state that they have all needed DME at home. CM spoke with Tommi Rumps at [facility] who confirmed plans are in place for Home First Program with services to start this afternoon. CM called Tiffany with [facility] to notify of family's choice for [facility] needs.   08/20/18- 1500- Krystian Ferrentino RN, CM- notified by Tommi Rumps with [facility] that family is considering [facility]- they have been active with [facility] for [facility] needs and paying out of pocket for PCS. CM will f/u with family in am for decision regarding transition needs and plan per family.   Dahlia Client [place], RN 08/20/2018, 10:14 AM [phone] 4E Transitions of Care RN Case Manager

## 2018-08-20 NOTE — Progress Notes (Signed)
Physical Therapy Treatment Patient Details Name: William [name] MRN: [id number] DOB: [birth year] Today's Date: 08/20/2018    History of Present Illness 84yo M w/ a hx of severe AoS, CHF w/ EF 20%, VT s/p AICD, prostate CA, and frequent UTI's who was admitted 10/25 with sepsis from a  urinary tract infection.  Went home on 10/31 with 24 hour care. Readmit for TAVR on 08/18/18.       PT Comments    Pt admitted with above diagnosis. Pt currently with functional limitations due to the deficits listed below (see PT Problem List). Pt was able to ambulate with mod assist and chair follow with very poor gait pattern needing max cues as well for sequencing and technique.  Wife and daughter aware that primary mode of mobility at home should be wheelchair until pt improves with balance.  Daughter states wife will not be alone with pt.  Plan is for pt to d/c this am.   Pt will benefit from skilled PT to increase their independence and safety with mobility to allow discharge to the venue listed below.    Follow Up Recommendations  Home health PT;Supervision/Assistance - 24 hour;Other (comment)(HHOT [facility])     Equipment Recommendations  None recommended by PT    Recommendations for Other Services       Precautions / Restrictions Precautions Precautions: Fall Restrictions Weight Bearing Restrictions: No    Mobility  Bed Mobility Overal bed mobility: Needs Assistance Bed Mobility: Supine to Sit     Supine to sit: Mod assist     General bed mobility comments: Assist for LEs and elevation of trunk.   Transfers Overall transfer level: Needs assistance Equipment used: Rolling walker (2 wheeled) Transfers: Sit to/from [facility] Sit to Stand: Mod assist         General transfer comment: cues for hand placement.  Assist to come forward. Pt needed max cuesfor sequencing.  Pt unableto stand fully upright  Pt with trunk, hip, and knee flexion.     Ambulation/Gait Ambulation/Gait assistance: Mod assist;+2 safety/equipment Gait Distance (Feet): 40 Feet(20 feet x 2) Assistive device: Rolling walker (2 wheeled) Gait Pattern/deviations: Step-to pattern;Decreased stride length;Decreased step length - right;Decreased step length - left;Trunk flexed;Antalgic;Leaning posteriorly;Wide base of support;Drifts right/left   Gait velocity interpretation: <1.31 ft/sec, indicative of household ambulator General Gait Details: very wide BOS stance and gait with flexed posture and much too far positioning behind the RW.  Pt consistently trying to walk outside the RW initially. With cues, pt did finally get bil Feet inside RW and maintained them in RW wtih constant cueing.  Wife and daughter present and aware of pts gait deviations and the need for mod assist.  Daughter states wife will never be alione with pt and understands that safest mode of mobility is wheelchair in home at current level.    Stairs             Wheelchair Mobility    Modified Rankin (Stroke Patients Only)       Balance Overall balance assessment: Needs assistance Sitting-balance support: Feet supported;Bilateral upper extremity supported Sitting balance-Leahy Scale: Fair   Postural control: Posterior lean Standing balance support: Bilateral upper extremity supported;During functional activity Standing balance-Leahy Scale: Poor Standing balance comment: STands wtih external support and bil UE support with flexed posutrue and very poor balance  Cognition Arousal/Alertness: Awake/alert Behavior During Therapy: WFL for tasks assessed/performed Overall Cognitive Status: History of cognitive impairments - at baseline                                 General Comments: pt with hx of dementia at baseline; requires increased time for processing/following one step commands, suspect partly due to pt is [facility]      Exercises  General Exercises - Lower Extremity Ankle Circles/Pumps: Both;10 reps;AROM;Seated Quad Sets: AROM;Both;10 reps;Supine Long Arc Quad: AROM;Both;10 reps;Seated    General Comments        Pertinent Vitals/Pain Pain Assessment: No/denies pain    Home Living                      Prior Function            PT Goals (current goals can now be found in the care plan section) Acute Rehab PT Goals Patient Stated Goal: regain his strength Progress towards PT goals: Progressing toward goals    Frequency    Min 3X/week      PT Plan Current plan remains appropriate    Co-evaluation              AM-PAC PT "6 Clicks" Daily Activity  Outcome Measure  Difficulty turning over in bed (including adjusting bedclothes, sheets and blankets)?: Unable Difficulty moving from lying on back to sitting on the side of the bed? : Unable Difficulty sitting down on and standing up from a chair with arms (e.g., wheelchair, bedside commode, etc,.)?: Unable Help needed moving to and from a bed to chair (including a wheelchair)?: A Lot Help needed walking in hospital room?: A Lot Help needed climbing 3-5 steps with a railing? : Total 6 Click Score: 8    End of Session Equipment Utilized During Treatment: Gait belt Activity Tolerance: Patient limited by fatigue Patient left: with call bell/phone within reach;with family/visitor present;in chair Nurse Communication: Mobility status PT Visit Diagnosis: Unsteadiness on feet (R26.81);Other abnormalities of gait and mobility (R26.89);Muscle weakness (generalized) (M62.81)     Time: 0930-1008 PT Time Calculation (min) (ACUTE ONLY): 38 min  Charges:  $Gait Training: 23-37 mins $Therapeutic Exercise: 8-22 mins                     [place] Pager:  [phone]  Office:  [place] 08/20/2018, 11:29 AM

## 2018-08-21 ENCOUNTER — Telehealth: Payer: Self-pay

## 2018-08-21 ENCOUNTER — Encounter: Payer: Self-pay | Admitting: Thoracic Surgery (Cardiothoracic Vascular Surgery)

## 2018-08-21 DIAGNOSIS — Z48812 Encounter for surgical aftercare following surgery on the circulatory system: Secondary | ICD-10-CM | POA: Diagnosis not present

## 2018-08-21 DIAGNOSIS — I5043 Acute on chronic combined systolic (congestive) and diastolic (congestive) heart failure: Secondary | ICD-10-CM | POA: Diagnosis not present

## 2018-08-21 DIAGNOSIS — I429 Cardiomyopathy, unspecified: Secondary | ICD-10-CM | POA: Diagnosis not present

## 2018-08-21 DIAGNOSIS — I4891 Unspecified atrial fibrillation: Secondary | ICD-10-CM | POA: Diagnosis not present

## 2018-08-21 DIAGNOSIS — I110 Hypertensive heart disease with heart failure: Secondary | ICD-10-CM | POA: Diagnosis not present

## 2018-08-21 DIAGNOSIS — I2510 Atherosclerotic heart disease of native coronary artery without angina pectoris: Secondary | ICD-10-CM | POA: Diagnosis not present

## 2018-08-21 LAB — CUP PACEART REMOTE DEVICE CHECK
Battery Remaining Longevity: 94 mo
Battery Voltage: 3 V
HighPow Impedance: 71 Ohm
Implantable Lead Implant Date: 20150331
Implantable Pulse Generator Implant Date: 20150331
Lead Channel Impedance Value: 399 Ohm
Lead Channel Pacing Threshold Amplitude: 0.875 V
Lead Channel Pacing Threshold Pulse Width: 0.4 ms
Lead Channel Setting Pacing Amplitude: 2.5 V
Lead Channel Setting Pacing Pulse Width: 0.4 ms
Lead Channel Setting Sensing Sensitivity: 0.3 mV
MDC IDC LEAD LOCATION: 753860
MDC IDC MSMT LEADCHNL RV IMPEDANCE VALUE: 342 Ohm
MDC IDC MSMT LEADCHNL RV SENSING INTR AMPL: 10.125 mV
MDC IDC MSMT LEADCHNL RV SENSING INTR AMPL: 10.125 mV
MDC IDC SESS DTM: 20191015171613
MDC IDC STAT BRADY RV PERCENT PACED: 0.01 %

## 2018-08-21 MED FILL — Magnesium Sulfate Inj 50%: INTRAMUSCULAR | Qty: 10 | Status: AC

## 2018-08-21 MED FILL — Heparin Sodium (Porcine) Inj 1000 Unit/ML: INTRAMUSCULAR | Qty: 30 | Status: AC

## 2018-08-21 MED FILL — Potassium Chloride Inj 2 mEq/ML: INTRAVENOUS | Qty: 40 | Status: AC

## 2018-08-21 NOTE — Telephone Encounter (Signed)
Patient contacted regarding discharge from [facility] on 08/20/2018.  Patient understands to follow up with provider Nell Range PA-C on 08/27/2018 at 2:30 PM at [facility]. Patient understands discharge instructions? yes Patient understands medications and regiment? yes Patient understands to bring all medications to this visit? yes  Per pt's wife the pt is doing well.  She did ask whether or not the pt needs to keep apt with PCP on 11/11 that was arranged from 10/31 hospital discharge.  Per that DC summary the pt was due for CXR, BMP and CBC at [facility].  The pt had these tests performed while hospitalized for TAVR.  I have forwarded these results to Dr Loren Racer office.  The pt's wife will contact his office to see if the apt is needed.  The pt will follow-up with the TAVR team on 08/27/18.

## 2018-08-22 ENCOUNTER — Other Ambulatory Visit: Payer: Self-pay | Admitting: Cardiovascular Disease

## 2018-08-24 DIAGNOSIS — I4891 Unspecified atrial fibrillation: Secondary | ICD-10-CM | POA: Diagnosis not present

## 2018-08-24 DIAGNOSIS — I5043 Acute on chronic combined systolic (congestive) and diastolic (congestive) heart failure: Secondary | ICD-10-CM | POA: Diagnosis not present

## 2018-08-24 DIAGNOSIS — I2510 Atherosclerotic heart disease of native coronary artery without angina pectoris: Secondary | ICD-10-CM | POA: Diagnosis not present

## 2018-08-24 DIAGNOSIS — Z48812 Encounter for surgical aftercare following surgery on the circulatory system: Secondary | ICD-10-CM | POA: Diagnosis not present

## 2018-08-24 DIAGNOSIS — I110 Hypertensive heart disease with heart failure: Secondary | ICD-10-CM | POA: Diagnosis not present

## 2018-08-24 DIAGNOSIS — I429 Cardiomyopathy, unspecified: Secondary | ICD-10-CM | POA: Diagnosis not present

## 2018-08-25 DIAGNOSIS — I429 Cardiomyopathy, unspecified: Secondary | ICD-10-CM | POA: Diagnosis not present

## 2018-08-25 DIAGNOSIS — Z48812 Encounter for surgical aftercare following surgery on the circulatory system: Secondary | ICD-10-CM | POA: Diagnosis not present

## 2018-08-25 DIAGNOSIS — I2510 Atherosclerotic heart disease of native coronary artery without angina pectoris: Secondary | ICD-10-CM | POA: Diagnosis not present

## 2018-08-25 DIAGNOSIS — I110 Hypertensive heart disease with heart failure: Secondary | ICD-10-CM | POA: Diagnosis not present

## 2018-08-25 DIAGNOSIS — I4891 Unspecified atrial fibrillation: Secondary | ICD-10-CM | POA: Diagnosis not present

## 2018-08-25 DIAGNOSIS — I5043 Acute on chronic combined systolic (congestive) and diastolic (congestive) heart failure: Secondary | ICD-10-CM | POA: Diagnosis not present

## 2018-08-26 DIAGNOSIS — I350 Nonrheumatic aortic (valve) stenosis: Secondary | ICD-10-CM | POA: Diagnosis not present

## 2018-08-26 DIAGNOSIS — Z95810 Presence of automatic (implantable) cardiac defibrillator: Secondary | ICD-10-CM | POA: Diagnosis not present

## 2018-08-26 DIAGNOSIS — M79631 Pain in right forearm: Secondary | ICD-10-CM | POA: Diagnosis not present

## 2018-08-26 DIAGNOSIS — N390 Urinary tract infection, site not specified: Secondary | ICD-10-CM | POA: Diagnosis not present

## 2018-08-26 DIAGNOSIS — I5023 Acute on chronic systolic (congestive) heart failure: Secondary | ICD-10-CM | POA: Diagnosis not present

## 2018-08-26 DIAGNOSIS — I10 Essential (primary) hypertension: Secondary | ICD-10-CM | POA: Diagnosis not present

## 2018-08-26 DIAGNOSIS — Z952 Presence of prosthetic heart valve: Secondary | ICD-10-CM | POA: Diagnosis not present

## 2018-08-26 DIAGNOSIS — Z6831 Body mass index (BMI) 31.0-31.9, adult: Secondary | ICD-10-CM | POA: Diagnosis not present

## 2018-08-26 DIAGNOSIS — M84331A Stress fracture, right ulna, initial encounter for fracture: Secondary | ICD-10-CM | POA: Diagnosis not present

## 2018-08-26 NOTE — Progress Notes (Signed)
[facility] [place]                                       Cardiology Office Note    Date:  08/27/2018   ID:  Doyne Keel Mclarty, DOB [birth year], MRN [id number]  PCP:  Haywood Pao, MD  Cardiologist: Sanda Klein, MD / Dr. Angelena Form & Dr. Cyndia Bent (TAVR)  CC: [facility] s/p TAVR  History of Present Illness:  William [name] is a 84 y.o. male with a history of  AAA, NICM/chronic systolic CHF s/p ICD, ventricular tachycardia, OSA, HLD, degenerative arthritis/lumbar vertebral fracture with reduced mobility and severe AS s/p TAVR (08/18/18) who presents to clinic for follow up.   The patient has been followed by Dr. Sallyanne Kuster. He was recently admitted with A/C CHF and treated with diuresis. Echocardiogram on 07/03/2018 showed a mean gradient across aortic valve of 21 mmHg. Peak gradient was 53 mmHg and dimensionless index was 0.16. Aortic valve area was 0.66 cm. Left ventricular ejection fraction was 20 to 25%. He has had progressive dyspnea with low-level exertion. He is not very active due to a previous fall down the stairs at home about 5 years ago where he broke his back. He does ambulate some in his house using a walker and if he gets out and has to go any distance he uses a wheelchair. He was seen by Dr. Angelena Form for consideration of transcatheter aortic valve replacement. Cardiac catheterization was performed on 07/06/2018 which showed mild nonobstructive coronary disease.He was seen by Dr. Cyndia Bent in consultation on 10/22 and felt to be a candidate for TAVR. He was scheduled for TAVR on 08/11/2018 but unfortunately was admitted with urosepsis on 08/07/2018. Urine culture grew klebsiella and he was treated with antibiotics and fluid. TAVR was postponed.  He underwent successful TAVR with a 29 mm Edwards Sapien 3 THV via the TF approach on 08/18/18. Post operative echo showed EF 30%, normally functioning TAVR with no PVL; mean gradient of 9 mm Hg. BP was  low post operatively and home Toprol XL 50mg  daily was held. He was discharged on ASA and plavix.   Today he presents to clinic for follow up. Doing well. He has had a slow recovery since his admission for sepsis. No CP or SOB. He has chronic LE edema but weight has been stable and no orthopnea or PND. No dizziness or syncope. No blood in stool or urine. No palpitations. He is working with home health PT to help regain his strength. Wife trying to get him to walk with his walker again. He overall thinks he is breathing better since having his valve surgery.    Past Medical History:  Diagnosis Date  . AAA (abdominal aortic aneurysm) ([place])    7/13 3.8cm  . Arthritis    "joints" (01/11/2014)  . Asthma    "seasonal; some foods"   . Chronic systolic CHF (congestive heart failure) ([place])   . Dementia ([place])   . HLD (hyperlipidemia)   . HOH (hard of hearing)   . Lumbar vertebral fracture ([facility])    L1- 04/11/2014   . Prostate cancer ([place])   . S/P ICD (internal cardiac defibrillator) procedure, 01/11/14 removal of ERI gen and placement of Medtronic Evera XT VR & NEW Right ventricular lead Medtronic 01/12/2014  . S/P TAVR (transcatheter aortic valve replacement)    Edwards Sapien 3  THV (size 29 mm, model # [id number], serial # [id number])  . Severe aortic stenosis   . Sleep apnea    "lost 60# & don't have it anymore" (01/11/2014)  . Ventricular tachycardia [facility])     Past Surgical History:  Procedure Laterality Date  . CARDIAC CATHETERIZATION  08/20/2004   noncritical CAD,mild global hypokinesis, EF 50%  . CARDIAC DEFIBRILLATOR PLACEMENT  08/23/2004   Medtronic  . CATARACT EXTRACTION W/ INTRAOCULAR LENS IMPLANT Right   . COLECTOMY  1990's  . CRYOABLATION N/A 02/24/2014   Procedure: CRYO ABLATION PROSTATE;  Surgeon: Ailene Rud, MD;  Location: [facility];  Service: Urology;  Laterality: N/A;  . HERNIA REPAIR     "abdomen; from colon OR"  . IMPLANTABLE CARDIOVERTER DEFIBRILLATOR (ICD) GENERATOR  CHANGE N/A 01/11/2014   Procedure: ICD GENERATOR CHANGE;  Surgeon: Sanda Klein, MD;  Location: [place] CATH LAB;  Service: Cardiovascular;  Laterality: N/A;  . LEAD REVISION N/A 01/11/2014   Procedure: LEAD REVISION;  Surgeon: Sanda Klein, MD;  Location: [place] CATH LAB;  Service: Cardiovascular;  Laterality: N/A;  . NM MYOCAR PERF WALL MOTION  01/29/2012   abnormal c/o infarct/scar,no ischemia present  . PROSTATE BIOPSY N/A 11/22/2013   Procedure: PROSTATE BIOPSY AND ULTRASOUND;  Surgeon: Ailene Rud, MD;  Location: [facility];  Service: Urology;  Laterality: N/A;  . RIGHT/LEFT HEART CATH AND CORONARY ANGIOGRAPHY N/A 07/06/2018   Procedure: RIGHT/LEFT HEART CATH AND CORONARY ANGIOGRAPHY;  Surgeon: Troy Sine, MD;  Location: [facility] CV LAB;  Service: Cardiovascular;  Laterality: N/A;  . TEE WITHOUT CARDIOVERSION N/A 08/18/2018   Procedure: TRANSESOPHAGEAL ECHOCARDIOGRAM (TEE);  Surgeon: Burnell Blanks, MD;  Location: [place];  Service: Open Heart Surgery;  Laterality: N/A;  . TRANSCATHETER AORTIC VALVE REPLACEMENT, TRANSFEMORAL  08/18/2018  . TRANSCATHETER AORTIC VALVE REPLACEMENT, TRANSFEMORAL N/A 08/18/2018   Procedure: TRANSCATHETER AORTIC VALVE REPLACEMENT, TRANSFEMORAL using an Edwards 62mm Aortic Valve;  Surgeon: Burnell Blanks, MD;  Location: [place];  Service: Open Heart Surgery;  Laterality: N/A;    Current Medications: Outpatient Medications Prior to Visit  Medication Sig Dispense Refill  . acetaminophen (TYLENOL) 500 MG tablet Take 500 mg by mouth every 6 (six) hours as needed (for pain).     Marland Kitchen amiodarone (PACERONE) 200 MG tablet TAKE 1/2 TABLET BY MOUTH EVERY DAY 45 tablet 0  . aspirin 81 MG chewable tablet Chew 1 tablet (81 mg total) by mouth daily. 90 tablet 3  . cholecalciferol (VITAMIN D) 1000 units tablet Take 2,000 Units by mouth at bedtime.    . clopidogrel (PLAVIX) 75 MG tablet Take 1 tablet (75 mg total) by mouth daily with breakfast. 90 tablet 1  .  ezetimibe-simvastatin (VYTORIN) 10-20 MG per tablet Take 1 tablet by mouth at bedtime.    . furosemide (LASIX) 20 MG tablet Take 1 tablet (20 mg total) by mouth daily. 90 tablet 3  . Multiple Vitamins-Minerals (PRESERVISION AREDS 2 PO) Take 1 tablet by mouth daily.    . polyethylene glycol (MIRALAX / GLYCOLAX) packet Take 17 g by mouth every evening. 14 each 1  . potassium chloride (K-DUR) 10 MEQ tablet Take 1 tablet (10 mEq total) by mouth daily. 30 tablet 6  . Skin Protectants, Misc. (AQUAPHOR LIP REPAIR) OINT Apply 1 application topically as needed.    . Triamcinolone Acetonide (TRIAMCINOLONE 0.1 % CREAM : EUCERIN) CREA Apply 1 application topically as needed for rash or itching.     No facility-administered medications prior to visit.  Allergies:   Crestor [rosuvastatin] and Lipitor [atorvastatin]   Social History   Socioeconomic History  . Marital status: Married    Spouse name: Not on file  . Number of children: 2  . Years of education: Not on file  . Highest education level: Not on file  Occupational History  . Occupation: Owned a Scientist, [employer]  Social Needs  . Financial resource strain: Not on file  . Food insecurity:    Worry: Not on file    Inability: Not on file  . Transportation needs:    Medical: Not on file    Non-medical: Not on file  Tobacco Use  . Smoking status: Never Smoker  . Smokeless tobacco: Never Used  Substance and Sexual Activity  . Alcohol use: Yes    Alcohol/week: 0.0 standard drinks    Comment: 01/11/2014 "drink 1/2 of a beer twice per month   . Drug use: No  . Sexual activity: Not Currently  Lifestyle  . Physical activity:    Days per week: Not on file    Minutes per session: Not on file  . Stress: Not on file  Relationships  . Social connections:    Talks on phone: Not on file    Gets together: Not on file    Attends religious service: Not on file    Active member of club or organization: Not on file    Attends meetings of  clubs or organizations: Not on file    Relationship status: Not on file  Other Topics Concern  . Not on file  Social History Narrative  . Not on file     Family History:  The patient's family history includes Heart failure in his mother; Pneumonia in his father.      ROS:   Please see the history of present illness.    ROS All other systems reviewed and are negative.   PHYSICAL EXAM:   VS:  BP 136/80   Pulse (!) 120   Ht 5\' 10"  (1.778 m)   Wt 218 lb 12.8 oz (99.2 kg)   SpO2 99%   BMI 31.39 kg/m    GEN: Well nourished, well developed, in no acute distress, elderly weak white male  HEENT: normal Neck: no JVD or masses Cardiac: RRR; soft flow murmurs. No rubs, or gallops. 1 + bilateral pitting edema  Respiratory:  clear to auscultation bilaterally, normal work of breathing GI: soft, nontender, nondistended, + BS MS: no deformity or atrophy Skin: warm and dry, no rash. Groin sites with small hematoma on right side, soft  Neuro:  Alert and Oriented x 3, Strength and sensation are intact Psych: euthymic mood, full affect    Wt Readings from Last 3 Encounters:  08/27/18 218 lb 12.8 oz (99.2 kg)  08/20/18 206 lb 5.6 oz (93.6 kg)  08/13/18 223 lb (101.2 kg)      Studies/Labs Reviewed:   EKG:  EKG is ordered today. This demonstrates sinus tachy with PACs and PVCs, RBBB, HR 108  Recent Labs: 08/13/2018: ALT 50; B Natriuretic Peptide 459.6 08/19/2018: Magnesium 2.4 08/20/2018: BUN 14; Creatinine, Ser 0.96; Hemoglobin 10.9; Platelets 110; Potassium 4.3; Sodium 136   Lipid Panel No results found for: CHOL, TRIG, HDL, CHOLHDL, VLDL, LDLCALC, LDLDIRECT  Additional studies/ records that were reviewed today include:   [place] VALVE TEAM  TAVR OPERATIVE NOTE   Date of Procedure:08/18/2018  Preoperative Diagnosis:Severe Aortic Stenosis  Procedure:   Transcatheter Aortic Valve Replacement -  PercutaneousRightTransfemoral Approach Edwards Sapien 3 THV (size 49mm, model # 9600TFX, serial # [id number])  Co-Surgeons:Bryan Alveria Apley, MD and Lauree Chandler, MD  Pre-operative Echo Findings: ? Severe aortic stenosis ? Moderateleft ventricular systolic function  Post-operative Echo Findings: ? Noparavalvular leak ? Moderateleft ventricular systolic function  _____________    Echo 08/19/18 Study Conclusions - Left ventricle: The cavity size was normal. Wall thickness was increased in a pattern of mild LVH. The estimated ejection fraction was 30%. Diffuse hypokinesis. Doppler parameters are consistent with abnormal left ventricular relaxation (grade 1 diastolic dysfunction). - Aortic valve: Bioprosthetic aortic valve s/p TAVR. No significant bioprosthetic aortic valve stenosis. There was no regurgitation. Mean gradient (S): 9 mm Hg. Valve area (VTI): 2.65 cm^2. - Aorta: Mildly dilated aortic root. Aortic root dimension: 40 mm (ED). - Mitral valve: Mildly calcified annulus. Mildly calcified leaflets . There was mild regurgitation. - Left atrium: The atrium was moderately dilated. - Right ventricle: The cavity size was normal. Pacer wire or catheter noted in right ventricle. Systolic function was normal. - Right atrium: The atrium was mildly dilated. - Tricuspid valve: Peak RV-RA gradient (S): 33 mm Hg. - Systemic veins: IVC not visualized. Impressions: - Normal LV size with mild LV hypertrophy. EF 30%, diffuse hypokinesis. Normal RV size and systolic function. Mild mitral regurgitation. Bioprosthetic aortic valve s/p TAVR, no significant bioprosthetic valve stenosis and no aortic insufficiency.  ASSESSMENT & PLAN:   Severe AS s/p TAVR:doing well, gradually getting stronger. Groin sites healing well. Continue asa and plavix. SBE prophylaxis discussed and amoxicillin called into his pharmacy. I  will see him back in 1 month for follow up and echo.  Sinus tachycardia: HR 108. ECG calling this afib. Review of rhythm strip shows clear P waves. I think this is just sinus tachy with RBBB, PACs and PVCs. Will resume home Toprol XL 50mg  daily which was stopped in the hospital due to soft BPs  HTN: BP 136/80, resume home Toprol XL 50mg  daily as above.   Hx of ventricular tachycardia: continue amiodarone. ICD in place, followed by Dr. Sallyanne Kuster.  Chronic combined S/D CHF: appears euvolemic. Resume Toprol XL. Continue lasix 20mg  daily.  AAA: infrarenal AAA 4.4 cm. This has been followed by Dr. Sallyanne Kuster for years but he decided to stop following it in 02/2018 as the patient was against any potential interventions.  Pulmonary nodules: multiple small pulmonary nodules noted throughout the lungs bilaterally. Incidental findings on pre TAVR CT. 6 month follow up non contrast CT recommended. I will get this set up at his 1 month follow up.    Medication Adjustments/Labs and Tests Ordered: Current medicines are reviewed at length with the patient today.  Concerns regarding medicines are outlined above.  Medication changes, Labs and Tests ordered today are listed in the Patient Instructions below. Patient Instructions  Medication Instructions:  1.) amoxicillin 500 mg tablets--take 4 tablets (2000 mg) 30-60 minutes prior to any dental procedures. 2.) Toprol XL (metoprolol succinate) 50 mg once daily  If you need a refill on your cardiac medications before your next appointment, please call your pharmacy.   Lab work: none If you have labs (blood work) drawn today and your tests are completely normal, you will receive your results only by: Marland Kitchen MyChart Message (if you have MyChart) OR . A paper copy in the mail If you have any lab test that is abnormal or we need to change your treatment, we will call you to review the results.  Testing/Procedures: none  Follow-Up:  Keep planned follow up.  Any  Other Special Instructions Will Be Listed Below (If Applicable).       Signed, Angelena Form, PA-C  08/27/2018 5:50 PM    [facility] [address], [place], [place]  [address] Phone: [phone]; Fax: [phone]

## 2018-08-27 ENCOUNTER — Ambulatory Visit (INDEPENDENT_AMBULATORY_CARE_PROVIDER_SITE_OTHER): Payer: Medicare Other | Admitting: Physician Assistant

## 2018-08-27 ENCOUNTER — Encounter: Payer: Self-pay | Admitting: Physician Assistant

## 2018-08-27 VITALS — BP 136/80 | HR 120 | Ht 70.0 in | Wt 218.8 lb

## 2018-08-27 DIAGNOSIS — I2510 Atherosclerotic heart disease of native coronary artery without angina pectoris: Secondary | ICD-10-CM | POA: Diagnosis not present

## 2018-08-27 DIAGNOSIS — Z48812 Encounter for surgical aftercare following surgery on the circulatory system: Secondary | ICD-10-CM | POA: Diagnosis not present

## 2018-08-27 DIAGNOSIS — I714 Abdominal aortic aneurysm, without rupture, unspecified: Secondary | ICD-10-CM

## 2018-08-27 DIAGNOSIS — I472 Ventricular tachycardia, unspecified: Secondary | ICD-10-CM

## 2018-08-27 DIAGNOSIS — I5022 Chronic systolic (congestive) heart failure: Secondary | ICD-10-CM

## 2018-08-27 DIAGNOSIS — I10 Essential (primary) hypertension: Secondary | ICD-10-CM

## 2018-08-27 DIAGNOSIS — R918 Other nonspecific abnormal finding of lung field: Secondary | ICD-10-CM

## 2018-08-27 DIAGNOSIS — I110 Hypertensive heart disease with heart failure: Secondary | ICD-10-CM | POA: Diagnosis not present

## 2018-08-27 DIAGNOSIS — I4891 Unspecified atrial fibrillation: Secondary | ICD-10-CM | POA: Diagnosis not present

## 2018-08-27 DIAGNOSIS — Z952 Presence of prosthetic heart valve: Secondary | ICD-10-CM

## 2018-08-27 DIAGNOSIS — I5043 Acute on chronic combined systolic (congestive) and diastolic (congestive) heart failure: Secondary | ICD-10-CM | POA: Diagnosis not present

## 2018-08-27 DIAGNOSIS — I429 Cardiomyopathy, unspecified: Secondary | ICD-10-CM | POA: Diagnosis not present

## 2018-08-27 DIAGNOSIS — R000 Tachycardia, unspecified: Secondary | ICD-10-CM | POA: Diagnosis not present

## 2018-08-27 MED ORDER — AMOXICILLIN 500 MG PO CAPS: ORAL_CAPSULE | ORAL | 2 refills | Status: DC

## 2018-08-27 MED ORDER — METOPROLOL SUCCINATE ER 50 MG PO TB24: 50.0000 mg | ORAL_TABLET | Freq: Every day | ORAL | 3 refills | Status: DC

## 2018-08-27 NOTE — Patient Instructions (Addendum)
Medication Instructions:  1.) amoxicillin 500 mg tablets--take 4 tablets (2000 mg) 30-60 minutes prior to any dental procedures. 2.) Toprol XL (metoprolol succinate) 50 mg once daily  If you need a refill on your cardiac medications before your next appointment, please call your pharmacy.   Lab work: none If you have labs (blood work) drawn today and your tests are completely normal, you will receive your results only by: Marland Kitchen MyChart Message (if you have MyChart) OR . A paper copy in the mail If you have any lab test that is abnormal or we need to change your treatment, we will call you to review the results.  Testing/Procedures: none  Follow-Up: Keep planned follow up.  Any Other Special Instructions Will Be Listed Below (If Applicable).

## 2018-08-28 ENCOUNTER — Other Ambulatory Visit (HOSPITAL_COMMUNITY): Payer: Self-pay | Admitting: Orthopedic Surgery

## 2018-08-28 ENCOUNTER — Ambulatory Visit (HOSPITAL_COMMUNITY)
Admission: RE | Admit: 2018-08-28 | Discharge: 2018-08-28 | Disposition: A | Discharge status: Home / Self Care | Payer: Medicare Other | Source: Ambulatory Visit | Attending: Cardiology | Admitting: Cardiology

## 2018-08-28 DIAGNOSIS — M79605 Pain in left leg: Secondary | ICD-10-CM | POA: Diagnosis not present

## 2018-08-28 DIAGNOSIS — M79662 Pain in left lower leg: Secondary | ICD-10-CM | POA: Diagnosis not present

## 2018-08-28 DIAGNOSIS — M79604 Pain in right leg: Secondary | ICD-10-CM

## 2018-08-28 DIAGNOSIS — I4891 Unspecified atrial fibrillation: Secondary | ICD-10-CM | POA: Diagnosis not present

## 2018-08-28 DIAGNOSIS — I429 Cardiomyopathy, unspecified: Secondary | ICD-10-CM | POA: Diagnosis not present

## 2018-08-28 DIAGNOSIS — M25531 Pain in right wrist: Secondary | ICD-10-CM | POA: Diagnosis not present

## 2018-08-28 DIAGNOSIS — I110 Hypertensive heart disease with heart failure: Secondary | ICD-10-CM | POA: Diagnosis not present

## 2018-08-28 DIAGNOSIS — M79661 Pain in right lower leg: Secondary | ICD-10-CM | POA: Diagnosis not present

## 2018-08-28 DIAGNOSIS — I2510 Atherosclerotic heart disease of native coronary artery without angina pectoris: Secondary | ICD-10-CM | POA: Diagnosis not present

## 2018-08-28 DIAGNOSIS — I5043 Acute on chronic combined systolic (congestive) and diastolic (congestive) heart failure: Secondary | ICD-10-CM | POA: Diagnosis not present

## 2018-08-28 DIAGNOSIS — Z48812 Encounter for surgical aftercare following surgery on the circulatory system: Secondary | ICD-10-CM | POA: Diagnosis not present

## 2018-08-31 ENCOUNTER — Other Ambulatory Visit: Payer: Self-pay | Admitting: Internal Medicine

## 2018-08-31 DIAGNOSIS — I429 Cardiomyopathy, unspecified: Secondary | ICD-10-CM | POA: Diagnosis not present

## 2018-08-31 DIAGNOSIS — I5043 Acute on chronic combined systolic (congestive) and diastolic (congestive) heart failure: Secondary | ICD-10-CM | POA: Diagnosis not present

## 2018-08-31 DIAGNOSIS — I2510 Atherosclerotic heart disease of native coronary artery without angina pectoris: Secondary | ICD-10-CM | POA: Diagnosis not present

## 2018-08-31 DIAGNOSIS — I110 Hypertensive heart disease with heart failure: Secondary | ICD-10-CM | POA: Diagnosis not present

## 2018-08-31 DIAGNOSIS — I4891 Unspecified atrial fibrillation: Secondary | ICD-10-CM | POA: Diagnosis not present

## 2018-08-31 DIAGNOSIS — Z48812 Encounter for surgical aftercare following surgery on the circulatory system: Secondary | ICD-10-CM | POA: Diagnosis not present

## 2018-08-31 DIAGNOSIS — R918 Other nonspecific abnormal finding of lung field: Secondary | ICD-10-CM

## 2018-09-01 ENCOUNTER — Other Ambulatory Visit: Payer: Self-pay | Admitting: Physician Assistant

## 2018-09-01 ENCOUNTER — Telehealth: Payer: Self-pay | Admitting: Physician Assistant

## 2018-09-01 DIAGNOSIS — I2510 Atherosclerotic heart disease of native coronary artery without angina pectoris: Secondary | ICD-10-CM | POA: Diagnosis not present

## 2018-09-01 DIAGNOSIS — I4891 Unspecified atrial fibrillation: Secondary | ICD-10-CM | POA: Diagnosis not present

## 2018-09-01 DIAGNOSIS — I429 Cardiomyopathy, unspecified: Secondary | ICD-10-CM | POA: Diagnosis not present

## 2018-09-01 DIAGNOSIS — I5043 Acute on chronic combined systolic (congestive) and diastolic (congestive) heart failure: Secondary | ICD-10-CM | POA: Diagnosis not present

## 2018-09-01 DIAGNOSIS — Z48812 Encounter for surgical aftercare following surgery on the circulatory system: Secondary | ICD-10-CM | POA: Diagnosis not present

## 2018-09-01 DIAGNOSIS — I110 Hypertensive heart disease with heart failure: Secondary | ICD-10-CM | POA: Diagnosis not present

## 2018-09-01 MED ORDER — METOPROLOL SUCCINATE ER 50 MG PO TB24: 25.0000 mg | ORAL_TABLET | Freq: Every day | ORAL | 6 refills | Status: DC

## 2018-09-01 NOTE — Progress Notes (Signed)
Toprol XL 50mg  daily changed to 25mg  daily.

## 2018-09-01 NOTE — Telephone Encounter (Signed)
  [facility]   MULTIDISCIPLINARY HEART VALVE TEAM   Patient's wife called due to some soft BPs. ~100-110 /60s. He was recently restarted on his Toprol XL 50 mg daily due to sinus tachycardia. HR now in 60s. Will plan to cut back to 25mg  daily. Also wife informed me that Dr. Loren Racer office has set up CT scan to follow pulmonary nodules.   Angelena Form PA-C  [facility]

## 2018-09-03 DIAGNOSIS — Z48812 Encounter for surgical aftercare following surgery on the circulatory system: Secondary | ICD-10-CM | POA: Diagnosis not present

## 2018-09-03 DIAGNOSIS — I5043 Acute on chronic combined systolic (congestive) and diastolic (congestive) heart failure: Secondary | ICD-10-CM | POA: Diagnosis not present

## 2018-09-03 DIAGNOSIS — I4891 Unspecified atrial fibrillation: Secondary | ICD-10-CM | POA: Diagnosis not present

## 2018-09-03 DIAGNOSIS — I2510 Atherosclerotic heart disease of native coronary artery without angina pectoris: Secondary | ICD-10-CM | POA: Diagnosis not present

## 2018-09-03 DIAGNOSIS — I110 Hypertensive heart disease with heart failure: Secondary | ICD-10-CM | POA: Diagnosis not present

## 2018-09-03 DIAGNOSIS — I429 Cardiomyopathy, unspecified: Secondary | ICD-10-CM | POA: Diagnosis not present

## 2018-09-04 ENCOUNTER — Emergency Department (HOSPITAL_COMMUNITY): Payer: Medicare Other

## 2018-09-04 ENCOUNTER — Inpatient Hospital Stay (HOSPITAL_COMMUNITY): Payer: Medicare Other

## 2018-09-04 ENCOUNTER — Encounter (HOSPITAL_COMMUNITY): Payer: Self-pay

## 2018-09-04 ENCOUNTER — Inpatient Hospital Stay (HOSPITAL_COMMUNITY)
Admission: EM | Admit: 2018-09-04 | Discharge: 2018-09-21 | DRG: 260 | Disposition: A | Discharge status: Rehabilitation Facility | Payer: Medicare Other | Attending: Family Medicine | Admitting: Family Medicine

## 2018-09-04 DIAGNOSIS — Z7982 Long term (current) use of aspirin: Secondary | ICD-10-CM

## 2018-09-04 DIAGNOSIS — B9689 Other specified bacterial agents as the cause of diseases classified elsewhere: Secondary | ICD-10-CM | POA: Diagnosis not present

## 2018-09-04 DIAGNOSIS — G92 Toxic encephalopathy: Secondary | ICD-10-CM | POA: Diagnosis not present

## 2018-09-04 DIAGNOSIS — Z9841 Cataract extraction status, right eye: Secondary | ICD-10-CM | POA: Diagnosis not present

## 2018-09-04 DIAGNOSIS — D696 Thrombocytopenia, unspecified: Secondary | ICD-10-CM | POA: Diagnosis not present

## 2018-09-04 DIAGNOSIS — I429 Cardiomyopathy, unspecified: Secondary | ICD-10-CM | POA: Diagnosis not present

## 2018-09-04 DIAGNOSIS — I5022 Chronic systolic (congestive) heart failure: Secondary | ICD-10-CM | POA: Diagnosis not present

## 2018-09-04 DIAGNOSIS — T827XXD Infection and inflammatory reaction due to other cardiac and vascular devices, implants and grafts, subsequent encounter: Secondary | ICD-10-CM | POA: Diagnosis not present

## 2018-09-04 DIAGNOSIS — Z7902 Long term (current) use of antithrombotics/antiplatelets: Secondary | ICD-10-CM

## 2018-09-04 DIAGNOSIS — R110 Nausea: Secondary | ICD-10-CM | POA: Diagnosis not present

## 2018-09-04 DIAGNOSIS — A419 Sepsis, unspecified organism: Secondary | ICD-10-CM | POA: Diagnosis present

## 2018-09-04 DIAGNOSIS — D638 Anemia in other chronic diseases classified elsewhere: Secondary | ICD-10-CM | POA: Diagnosis present

## 2018-09-04 DIAGNOSIS — I330 Acute and subacute infective endocarditis: Secondary | ICD-10-CM

## 2018-09-04 DIAGNOSIS — R7881 Bacteremia: Secondary | ICD-10-CM | POA: Diagnosis not present

## 2018-09-04 DIAGNOSIS — T827XXA Infection and inflammatory reaction due to other cardiac and vascular devices, implants and grafts, initial encounter: Secondary | ICD-10-CM | POA: Diagnosis not present

## 2018-09-04 DIAGNOSIS — I472 Ventricular tachycardia, unspecified: Secondary | ICD-10-CM

## 2018-09-04 DIAGNOSIS — K8020 Calculus of gallbladder without cholecystitis without obstruction: Secondary | ICD-10-CM | POA: Diagnosis present

## 2018-09-04 DIAGNOSIS — H9190 Unspecified hearing loss, unspecified ear: Secondary | ICD-10-CM | POA: Diagnosis not present

## 2018-09-04 DIAGNOSIS — R404 Transient alteration of awareness: Secondary | ICD-10-CM | POA: Diagnosis not present

## 2018-09-04 DIAGNOSIS — A4159 Other Gram-negative sepsis: Secondary | ICD-10-CM | POA: Diagnosis present

## 2018-09-04 DIAGNOSIS — E1165 Type 2 diabetes mellitus with hyperglycemia: Secondary | ICD-10-CM | POA: Diagnosis not present

## 2018-09-04 DIAGNOSIS — Z961 Presence of intraocular lens: Secondary | ICD-10-CM | POA: Diagnosis present

## 2018-09-04 DIAGNOSIS — G4733 Obstructive sleep apnea (adult) (pediatric): Secondary | ICD-10-CM | POA: Diagnosis present

## 2018-09-04 DIAGNOSIS — Z8546 Personal history of malignant neoplasm of prostate: Secondary | ICD-10-CM

## 2018-09-04 DIAGNOSIS — I428 Other cardiomyopathies: Secondary | ICD-10-CM | POA: Diagnosis not present

## 2018-09-04 DIAGNOSIS — M2550 Pain in unspecified joint: Secondary | ICD-10-CM | POA: Diagnosis not present

## 2018-09-04 DIAGNOSIS — I714 Abdominal aortic aneurysm, without rupture: Secondary | ICD-10-CM | POA: Diagnosis not present

## 2018-09-04 DIAGNOSIS — Z95811 Presence of heart assist device: Secondary | ICD-10-CM | POA: Diagnosis not present

## 2018-09-04 DIAGNOSIS — Z952 Presence of prosthetic heart valve: Secondary | ICD-10-CM | POA: Diagnosis not present

## 2018-09-04 DIAGNOSIS — T827XXS Infection and inflammatory reaction due to other cardiac and vascular devices, implants and grafts, sequela: Secondary | ICD-10-CM

## 2018-09-04 DIAGNOSIS — M1990 Unspecified osteoarthritis, unspecified site: Secondary | ICD-10-CM | POA: Diagnosis present

## 2018-09-04 DIAGNOSIS — T8140XA Infection following a procedure, unspecified, initial encounter: Secondary | ICD-10-CM

## 2018-09-04 DIAGNOSIS — J9811 Atelectasis: Secondary | ICD-10-CM | POA: Diagnosis not present

## 2018-09-04 DIAGNOSIS — K5790 Diverticulosis of intestine, part unspecified, without perforation or abscess without bleeding: Secondary | ICD-10-CM | POA: Diagnosis not present

## 2018-09-04 DIAGNOSIS — Z888 Allergy status to other drugs, medicaments and biological substances status: Secondary | ICD-10-CM | POA: Diagnosis not present

## 2018-09-04 DIAGNOSIS — F0390 Unspecified dementia without behavioral disturbance: Secondary | ICD-10-CM | POA: Diagnosis not present

## 2018-09-04 DIAGNOSIS — R000 Tachycardia, unspecified: Secondary | ICD-10-CM | POA: Diagnosis not present

## 2018-09-04 DIAGNOSIS — I350 Nonrheumatic aortic (valve) stenosis: Secondary | ICD-10-CM | POA: Diagnosis not present

## 2018-09-04 DIAGNOSIS — Z8679 Personal history of other diseases of the circulatory system: Secondary | ICD-10-CM

## 2018-09-04 DIAGNOSIS — N281 Cyst of kidney, acquired: Secondary | ICD-10-CM | POA: Diagnosis present

## 2018-09-04 DIAGNOSIS — Z95810 Presence of automatic (implantable) cardiac defibrillator: Secondary | ICD-10-CM

## 2018-09-04 DIAGNOSIS — B961 Klebsiella pneumoniae [K. pneumoniae] as the cause of diseases classified elsewhere: Secondary | ICD-10-CM | POA: Diagnosis not present

## 2018-09-04 DIAGNOSIS — Z87440 Personal history of urinary (tract) infections: Secondary | ICD-10-CM | POA: Diagnosis not present

## 2018-09-04 DIAGNOSIS — Z8249 Family history of ischemic heart disease and other diseases of the circulatory system: Secondary | ICD-10-CM

## 2018-09-04 DIAGNOSIS — I959 Hypotension, unspecified: Secondary | ICD-10-CM | POA: Diagnosis not present

## 2018-09-04 DIAGNOSIS — Y831 Surgical operation with implant of artificial internal device as the cause of abnormal reaction of the patient, or of later complication, without mention of misadventure at the time of the procedure: Secondary | ICD-10-CM | POA: Diagnosis present

## 2018-09-04 DIAGNOSIS — Z45010 Encounter for checking and testing of cardiac pacemaker pulse generator [battery]: Secondary | ICD-10-CM | POA: Diagnosis not present

## 2018-09-04 DIAGNOSIS — I38 Endocarditis, valve unspecified: Secondary | ICD-10-CM | POA: Diagnosis not present

## 2018-09-04 DIAGNOSIS — R7989 Other specified abnormal findings of blood chemistry: Secondary | ICD-10-CM | POA: Diagnosis not present

## 2018-09-04 DIAGNOSIS — Z954 Presence of other heart-valve replacement: Secondary | ICD-10-CM | POA: Diagnosis not present

## 2018-09-04 DIAGNOSIS — R1011 Right upper quadrant pain: Secondary | ICD-10-CM | POA: Diagnosis not present

## 2018-09-04 DIAGNOSIS — R918 Other nonspecific abnormal finding of lung field: Secondary | ICD-10-CM | POA: Diagnosis not present

## 2018-09-04 DIAGNOSIS — E785 Hyperlipidemia, unspecified: Secondary | ICD-10-CM | POA: Diagnosis not present

## 2018-09-04 DIAGNOSIS — K5730 Diverticulosis of large intestine without perforation or abscess without bleeding: Secondary | ICD-10-CM | POA: Diagnosis present

## 2018-09-04 DIAGNOSIS — Z7401 Bed confinement status: Secondary | ICD-10-CM | POA: Diagnosis not present

## 2018-09-04 DIAGNOSIS — I340 Nonrheumatic mitral (valve) insufficiency: Secondary | ICD-10-CM | POA: Diagnosis not present

## 2018-09-04 DIAGNOSIS — Z9049 Acquired absence of other specified parts of digestive tract: Secondary | ICD-10-CM | POA: Diagnosis not present

## 2018-09-04 DIAGNOSIS — R5381 Other malaise: Secondary | ICD-10-CM | POA: Diagnosis not present

## 2018-09-04 DIAGNOSIS — E876 Hypokalemia: Secondary | ICD-10-CM | POA: Diagnosis not present

## 2018-09-04 DIAGNOSIS — Z1612 Extended spectrum beta lactamase (ESBL) resistance: Secondary | ICD-10-CM | POA: Diagnosis not present

## 2018-09-04 LAB — TROPONIN I: TROPONIN I: 0.03 ng/mL — AB (ref <0.03)

## 2018-09-04 LAB — CBC WITH DIFFERENTIAL/PLATELET
ABS IMMATURE GRANULOCYTES: 0.03 10*3/uL (ref 0.00–0.07)
BASOS ABS: 0 10*3/uL (ref 0.0–0.1)
Basophils Relative: 0 %
EOS PCT: 2 %
Eosinophils Absolute: 0.1 10*3/uL (ref 0.0–0.5)
HCT: 36.9 % — ABNORMAL LOW (ref 39.0–52.0)
HEMOGLOBIN: 13 g/dL (ref 13.0–17.0)
Immature Granulocytes: 1 %
LYMPHS ABS: 0.3 10*3/uL — AB (ref 0.7–4.0)
LYMPHS PCT: 5 %
MCH: 33.6 pg (ref 26.0–34.0)
MCHC: 35.2 g/dL (ref 30.0–36.0)
MCV: 95.3 fL (ref 80.0–100.0)
MONO ABS: 0.3 10*3/uL (ref 0.1–1.0)
Monocytes Relative: 5 %
NEUTROS ABS: 5.6 10*3/uL (ref 1.7–7.7)
Neutrophils Relative %: 87 %
Platelets: 108 10*3/uL — ABNORMAL LOW (ref 150–400)
RBC: 3.87 MIL/uL — ABNORMAL LOW (ref 4.22–5.81)
RDW: 15.9 % — ABNORMAL HIGH (ref 11.5–15.5)
WBC: 6.4 10*3/uL (ref 4.0–10.5)
nRBC: 0 % (ref 0.0–0.2)

## 2018-09-04 LAB — URINALYSIS, ROUTINE W REFLEX MICROSCOPIC
BILIRUBIN URINE: NEGATIVE
Glucose, UA: NEGATIVE mg/dL
Hgb urine dipstick: NEGATIVE
Ketones, ur: NEGATIVE mg/dL
Leukocytes, UA: NEGATIVE
NITRITE: NEGATIVE
PH: 6 (ref 5.0–8.0)
Protein, ur: NEGATIVE mg/dL
SPECIFIC GRAVITY, URINE: 1.01 (ref 1.005–1.030)

## 2018-09-04 LAB — COMPREHENSIVE METABOLIC PANEL
ALBUMIN: 3.3 g/dL — AB (ref 3.5–5.0)
ALT: 164 U/L — AB (ref 0–44)
AST: 249 U/L — AB (ref 15–41)
Alkaline Phosphatase: 356 U/L — ABNORMAL HIGH (ref 38–126)
Anion gap: 13 (ref 5–15)
BUN: 15 mg/dL (ref 8–23)
CHLORIDE: 99 mmol/L (ref 98–111)
CO2: 22 mmol/L (ref 22–32)
Calcium: 8.6 mg/dL — ABNORMAL LOW (ref 8.9–10.3)
Creatinine, Ser: 1.16 mg/dL (ref 0.61–1.24)
GFR calc Af Amer: >60 mL/min (ref >60)
GFR calc non Af Amer: 54 mL/min — ABNORMAL LOW (ref >60)
Glucose, Bld: 255 mg/dL — ABNORMAL HIGH (ref 70–99)
POTASSIUM: 3.5 mmol/L (ref 3.5–5.1)
SODIUM: 134 mmol/L — AB (ref 135–145)
Total Bilirubin: 3.7 mg/dL — ABNORMAL HIGH (ref 0.3–1.2)
Total Protein: 7 g/dL (ref 6.5–8.1)

## 2018-09-04 LAB — I-STAT CG4 LACTIC ACID, ED
LACTIC ACID, VENOUS: 2.74 mmol/L — AB (ref 0.5–1.9)
Lactic Acid, Venous: 3.64 mmol/L (ref 0.5–1.9)

## 2018-09-04 LAB — ECHOCARDIOGRAM COMPLETE

## 2018-09-04 MED ORDER — VANCOMYCIN HCL 10 G IV SOLR
2000.0000 mg | Freq: Once | INTRAVENOUS | Status: AC
  Administered 2018-09-04: 2000 mg via INTRAVENOUS
  Filled 2018-09-04: qty 2000

## 2018-09-04 MED ORDER — CLOPIDOGREL BISULFATE 75 MG PO TABS
75.0000 mg | ORAL_TABLET | Freq: Every day | ORAL | Status: DC
  Administered 2018-09-05 – 2018-09-14 (×10): 75 mg via ORAL
  Filled 2018-09-04 (×10): qty 1

## 2018-09-04 MED ORDER — EZETIMIBE-SIMVASTATIN 10-20 MG PO TABS
1.0000 | ORAL_TABLET | Freq: Every day | ORAL | Status: DC
  Administered 2018-09-05 – 2018-09-20 (×16): 1 via ORAL
  Filled 2018-09-04 (×17): qty 1

## 2018-09-04 MED ORDER — ASPIRIN 81 MG PO CHEW
81.0000 mg | CHEWABLE_TABLET | Freq: Every day | ORAL | Status: DC
  Administered 2018-09-05 – 2018-09-21 (×16): 81 mg via ORAL
  Filled 2018-09-04 (×16): qty 1

## 2018-09-04 MED ORDER — ENOXAPARIN SODIUM 40 MG/0.4ML ~~LOC~~ SOLN
40.0000 mg | Freq: Every day | SUBCUTANEOUS | Status: DC
  Administered 2018-09-05 – 2018-09-07 (×3): 40 mg via SUBCUTANEOUS
  Filled 2018-09-04 (×3): qty 0.4

## 2018-09-04 MED ORDER — METRONIDAZOLE IN NACL 5-0.79 MG/ML-% IV SOLN
500.0000 mg | Freq: Three times a day (TID) | INTRAVENOUS | Status: DC
  Administered 2018-09-04 – 2018-09-05 (×4): 500 mg via INTRAVENOUS
  Filled 2018-09-04 (×4): qty 100

## 2018-09-04 MED ORDER — ACETAMINOPHEN 325 MG PO TABS: 650.0000 mg | ORAL_TABLET | Freq: Four times a day (QID) | ORAL | Status: DC | PRN

## 2018-09-04 MED ORDER — SODIUM CHLORIDE 0.9 % IV SOLN
2.0000 g | Freq: Once | INTRAVENOUS | Status: AC
  Administered 2018-09-04: 2 g via INTRAVENOUS
  Filled 2018-09-04: qty 2

## 2018-09-04 MED ORDER — AMIODARONE HCL 200 MG PO TABS
100.0000 mg | ORAL_TABLET | Freq: Every day | ORAL | Status: DC
  Administered 2018-09-05 – 2018-09-21 (×17): 100 mg via ORAL
  Filled 2018-09-04 (×17): qty 1

## 2018-09-04 MED ORDER — VANCOMYCIN HCL 10 G IV SOLR: 1250.0000 mg | INTRAVENOUS | Status: DC

## 2018-09-04 MED ORDER — SODIUM CHLORIDE 0.9% FLUSH
3.0000 mL | Freq: Two times a day (BID) | INTRAVENOUS | Status: DC
  Administered 2018-09-05 – 2018-09-16 (×19): 3 mL via INTRAVENOUS

## 2018-09-04 MED ORDER — VANCOMYCIN HCL IN DEXTROSE 1-5 GM/200ML-% IV SOLN: 1000.0000 mg | Freq: Once | INTRAVENOUS | Status: DC

## 2018-09-04 MED ORDER — IOHEXOL 300 MG/ML  SOLN
100.0000 mL | Freq: Once | INTRAMUSCULAR | Status: AC | PRN
  Administered 2018-09-04: 100 mL via INTRAVENOUS

## 2018-09-04 MED ORDER — ACETAMINOPHEN 650 MG RE SUPP: 650.0000 mg | Freq: Four times a day (QID) | RECTAL | Status: DC | PRN

## 2018-09-04 MED ORDER — SODIUM CHLORIDE 0.9 % IV SOLN: 2.0000 g | INTRAVENOUS | Status: DC

## 2018-09-04 NOTE — ED Notes (Signed)
Critical Lab - Troponin: 0.03

## 2018-09-04 NOTE — H&P (Signed)
History and Physical    William [name] WFU:[id number] DOB: 12-13-1927 DOA: 09/04/2018  PCP: Haywood Pao, MD  Patient coming from: Home  I have personally briefly reviewed patient's old medical records in [place]  Chief Complaint: chills, fever  HPI: William [name] is a 84 y.o. male with medical history significant for Chronic Systolic CHF (EF 20-25%), ICM, Severe AS s/p TAVR 08/18/18, Hx of ventricular tachycardia s/p ICD, AAA, hyperlipidemia, prostate cancer with recurrent UTI, and degenerative arthritis who presents to the hospital with sudden onset of fevers, chills, and fatigue.  Patient was recently admitted from 08/07/2018-08/13/2018 for septic shock secondary to Klebsiella UTI.  He was admitted again from 08/18/2018-08/20/2018 for successful TAVR procedure.  Per family has been progressing well since his TAVR and was able to walk 50 feet this morning with the use of a walker.  Around 1 PM he had sudden onset of shaking chills, fatigue, and change in mental status.  Patient has a reported history of mild dementia but is able to answer some simple questions.  He does report subjective fevers and chills.  He had some nausea without emesis.  He denies any chest pain, shortness of breath, abdominal pain, diarrhea, constipation, dysuria.  ED Course:  Initial vitals showed BP 106/61, pulse 120, RR 26, temp 101.41F daily, SPO2 95% on room air.  Lab work was notable for WBC 6.4, hemoglobin 13.0, platelets 108k, AST 249, ALT 164, alkaline phosphatase 356, total bilirubin 3.7.  Lactic acid was 3.64 >> 2.74.  Urinalysis was negative.  Portable chest x-ray showed a right basilar opacity suggestive of atelectasis versus airspace disease/pneumonia.  CT abdomen/pelvis with contrast showed uncomplicated cholelithiasis, 4.4 cm infrarenal AAA, bilateral simple and complex renal cyst, and colonic diverticulosis without acute diverticulitis.  Blood cultures were drawn and patient was  started on vancomycin, cefepime, metronidazole.  Cardiothoracic surgery were consulted and ordered a transthoracic echocardiogram which showed EF 35-40%, diffuse hypokinesis with akinesis of the inferior septal myocardium, TAVR bioprosthesis, and no evidence of vegetation.    Review of Systems: As per HPI otherwise 10 point review of systems negative.    Past Medical History:  Diagnosis Date  . AAA (abdominal aortic aneurysm) ([place])    7/13 3.8cm  . Arthritis    "joints" (01/11/2014)  . Asthma    "seasonal; some foods"   . Chronic systolic CHF (congestive heart failure) ([place])   . Dementia ([place])   . HLD (hyperlipidemia)   . HOH (hard of hearing)   . Lumbar vertebral fracture ([facility])    L1- 04/11/2014   . Prostate cancer ([place])   . S/P ICD (internal cardiac defibrillator) procedure, 01/11/14 removal of ERI gen and placement of Medtronic Evera XT VR & NEW Right ventricular lead Medtronic 01/12/2014  . S/P TAVR (transcatheter aortic valve replacement)    Edwards Sapien 3 THV (size 29 mm, model # [id number], serial # [id number])  . Severe aortic stenosis   . Sleep apnea    "lost 60# & don't have it anymore" (01/11/2014)  . Ventricular tachycardia [facility])     Past Surgical History:  Procedure Laterality Date  . CARDIAC CATHETERIZATION  08/20/2004   noncritical CAD,mild global hypokinesis, EF 50%  . CARDIAC DEFIBRILLATOR PLACEMENT  08/23/2004   Medtronic  . CATARACT EXTRACTION W/ INTRAOCULAR LENS IMPLANT Right   . COLECTOMY  1990's  . CRYOABLATION N/A 02/24/2014   Procedure: CRYO ABLATION PROSTATE;  Surgeon: Ailene Rud, MD;  Location: [facility];  Service: Urology;  Laterality: N/A;  . HERNIA REPAIR     "abdomen; from colon OR"  . IMPLANTABLE CARDIOVERTER DEFIBRILLATOR (ICD) GENERATOR CHANGE N/A 01/11/2014   Procedure: ICD GENERATOR CHANGE;  Surgeon: Sanda Klein, MD;  Location: [place] CATH LAB;  Service: Cardiovascular;  Laterality: N/A;  . LEAD REVISION N/A 01/11/2014   Procedure: LEAD  REVISION;  Surgeon: Sanda Klein, MD;  Location: [place] CATH LAB;  Service: Cardiovascular;  Laterality: N/A;  . NM MYOCAR PERF WALL MOTION  01/29/2012   abnormal c/o infarct/scar,no ischemia present  . PROSTATE BIOPSY N/A 11/22/2013   Procedure: PROSTATE BIOPSY AND ULTRASOUND;  Surgeon: Ailene Rud, MD;  Location: [facility];  Service: Urology;  Laterality: N/A;  . RIGHT/LEFT HEART CATH AND CORONARY ANGIOGRAPHY N/A 07/06/2018   Procedure: RIGHT/LEFT HEART CATH AND CORONARY ANGIOGRAPHY;  Surgeon: Troy Sine, MD;  Location: [place] CV LAB;  Service: Cardiovascular;  Laterality: N/A;  . TEE WITHOUT CARDIOVERSION N/A 08/18/2018   Procedure: TRANSESOPHAGEAL ECHOCARDIOGRAM (TEE);  Surgeon: Burnell Blanks, MD;  Location: [place];  Service: Open Heart Surgery;  Laterality: N/A;  . TRANSCATHETER AORTIC VALVE REPLACEMENT, TRANSFEMORAL  08/18/2018  . TRANSCATHETER AORTIC VALVE REPLACEMENT, TRANSFEMORAL N/A 08/18/2018   Procedure: TRANSCATHETER AORTIC VALVE REPLACEMENT, TRANSFEMORAL using an Edwards 54mm Aortic Valve;  Surgeon: Burnell Blanks, MD;  Location: [place];  Service: Open Heart Surgery;  Laterality: N/A;     reports that he has never smoked. He has never used smokeless tobacco. He reports that he drinks alcohol. He reports that he does not use drugs.  Allergies  Allergen Reactions  . Crestor [Rosuvastatin] Other (See Comments)    Hurts muscles  . Lipitor [Atorvastatin] Other (See Comments)    Hurts stomach    Family History  Problem Relation Age of Onset  . Heart failure Mother        Died of "old age" at 32  . Pneumonia Father      Prior to Admission medications   Medication Sig Start Date End Date Taking? Authorizing Provider  acetaminophen (TYLENOL) 500 MG tablet Take 500 mg by mouth every 6 (six) hours as needed (for pain).    Yes [provider]  amiodarone (PACERONE) 200 MG tablet TAKE 1/2 TABLET BY MOUTH EVERY DAY Patient taking differently: Take  100 mg by mouth daily.  08/14/18  Yes Croitoru, Mihai, MD  amoxicillin (AMOXIL) 500 MG capsule Take 4 tablets (2000 mg) 30-60 min prior to dental procedures Patient taking differently: Take 2,000 mg by mouth See admin instructions. Take 4 tablets (2000 mg) by mouth 30-60 min prior to dental procedures 08/27/18  Yes Eileen Stanford, PA-C  aspirin 81 MG chewable tablet Chew 1 tablet (81 mg total) by mouth daily. 07/08/18  Yes Kroeger, Lorelee Cover., PA-C  cholecalciferol (VITAMIN D) 1000 units tablet Take 2,000 Units by mouth at bedtime.   Yes [provider]  clopidogrel (PLAVIX) 75 MG tablet Take 1 tablet (75 mg total) by mouth daily with breakfast. 08/21/18  Yes Eileen Stanford, PA-C  ezetimibe-simvastatin (VYTORIN) 10-20 MG per tablet Take 1 tablet by mouth at bedtime.   Yes [provider]  furosemide (LASIX) 20 MG tablet Take 1 tablet (20 mg total) by mouth daily. 07/08/18 10/06/18 Yes Croitoru, Mihai, MD  metoprolol succinate (TOPROL-XL) 50 MG 24 hr tablet Take 1 tablet (50 mg total) by mouth daily. Take with or immediately following a meal. 09/01/18  Yes Eileen Stanford, PA-C  mineral oil-hydrophilic petrolatum (AQUAPHOR) ointment Apply 1  application topically daily as needed for dry skin.   Yes [provider]  Multiple Vitamins-Minerals (PRESERVISION AREDS 2 PO) Take 1 capsule by mouth daily.    Yes [provider]  polyethylene glycol (MIRALAX / GLYCOLAX) packet Take 17 g by mouth every evening. Patient taking differently: Take by mouth See admin instructions. Dissolve 5 mls miralax powder and water and drink daily with supper 01/21/17  Yes Croitoru, Mihai, MD  potassium chloride (K-DUR) 10 MEQ tablet Take 1 tablet (10 mEq total) by mouth daily. 08/20/18  Yes Eileen Stanford, PA-C  Triamcinolone Acetonide (TRIAMCINOLONE 0.1 % CREAM : EUCERIN) CREA Apply 1 application topically daily as needed for rash or itching.    Yes [provider]     Physical Exam: Vitals:   09/04/18 1430 09/04/18 1515 09/04/18 1645 09/04/18 1900  BP: 106/61 (!) 103/56 102/72 (!) 90/59  Pulse: (!) 120 (!) 124 (!) 112 100  Resp: (!) 26 (!) 26  (!) 24  Temp:      TempSrc:      SpO2: 95% 95% 93% 98%    Constitutional: Elderly man lying in bed, NAD, calm, comfortable Eyes: PERRL, lids and conjunctivae normal ENMT: Mucous membranes are moist. Posterior pharynx clear of any exudate or lesions.Normal dentition.  Neck: normal, supple, no masses. Respiratory: Faint basilar crackles, no wheezing. Normal respiratory effort. No accessory muscle use.  Cardiovascular: Regular rate and rhythm, no murmurs / rubs / gallops. +1 extremity edema. ICD left chest wall without erythema, fluctuance, or tenderness to palpation. Abdomen: Tender to palpation at RUQ, no masses palpated. No hepatosplenomegaly. Bowel sounds positive.  Musculoskeletal: Right wrist with wound dressing in place.  No clubbing / cyanosis. No contractures. Normal muscle tone.  Skin: Scattered erythematous punctate rash right lower extremity Neurologic: CN 2-12 grossly intact. Sensation intact. Psychiatric:  Alert and oriented to person and place, not to year (daughter says he generally does not know the year). Normal mood.     Labs on Admission: I have personally reviewed following labs and imaging studies  CBC: Recent Labs  Lab 09/04/18 1425  WBC 6.4  NEUTROABS 5.6  HGB 13.0  HCT 36.9*  MCV 95.3  PLT 841*   Basic Metabolic Panel: Recent Labs  Lab 09/04/18 1425  NA 134*  K 3.5  CL 99  CO2 22  GLUCOSE 255*  BUN 15  CREATININE 1.16  CALCIUM 8.6*   GFR: Estimated Creatinine Clearance: 52 mL/min (by C-G formula based on SCr of 1.16 mg/dL). Liver Function Tests: Recent Labs  Lab 09/04/18 1425  AST 249*  ALT 164*  ALKPHOS 356*  BILITOT 3.7*  PROT 7.0  ALBUMIN 3.3*   No results for input(s): LIPASE, AMYLASE in the last 168 hours. No results for input(s): AMMONIA in  the last 168 hours. Coagulation Profile: No results for input(s): INR, PROTIME in the last 168 hours. Cardiac Enzymes: No results for input(s): CKTOTAL, CKMB, CKMBINDEX, TROPONINI in the last 168 hours. BNP (last 3 results) No results for input(s): PROBNP in the last 8760 hours. HbA1C: No results for input(s): HGBA1C in the last 72 hours. CBG: No results for input(s): GLUCAP in the last 168 hours. Lipid Profile: No results for input(s): CHOL, HDL, LDLCALC, TRIG, CHOLHDL, LDLDIRECT in the last 72 hours. Thyroid Function Tests: No results for input(s): TSH, T4TOTAL, FREET4, T3FREE, THYROIDAB in the last 72 hours. Anemia Panel: No results for input(s): VITAMINB12, FOLATE, FERRITIN, TIBC, IRON, RETICCTPCT in the last 72 hours. Urine analysis:  Component Value Date/Time   COLORURINE YELLOW 09/04/2018 1425   APPEARANCEUR CLEAR 09/04/2018 1425   LABSPEC 1.010 09/04/2018 1425   PHURINE 6.0 09/04/2018 1425   GLUCOSEU NEGATIVE 09/04/2018 1425   HGBUR NEGATIVE 09/04/2018 [place] 09/04/2018 [place] 09/04/2018 1425   PROTEINUR NEGATIVE 09/04/2018 1425   NITRITE NEGATIVE 09/04/2018 1425   LEUKOCYTESUR NEGATIVE 09/04/2018 1425    Radiological Exams on Admission: Ct Abdomen Pelvis W Contrast  Result Date: 09/04/2018 CLINICAL DATA:  Abdominal pain and fever EXAM: CT ABDOMEN AND PELVIS WITH CONTRAST TECHNIQUE: Multidetector CT imaging of the abdomen and pelvis was performed using the standard protocol following bolus administration of intravenous contrast. CONTRAST:  153mL OMNIPAQUE IOHEXOL 300 MG/ML  SOLN COMPARISON:  08/03/2018 FINDINGS: Lower chest: Cardiomegaly with evidence of prior TAVR. Coronary arteriosclerosis is noted. Bibasilar dependent atelectasis. Hepatobiliary: Numerous gallstones are identified within the gallbladder without secondary signs of cholecystitis. No mural thickening or pericholecystic fluid is noted. No space-occupying mass of the  liver nor biliary dilatation is seen. Pancreas: Atrophic pancreas without inflammation or ductal dilatation. Tiny subcentimeter cystic foci noted of the body of the pancreas, stable in appearance dating back to 2016 consistent benign findings. Spleen: Normal Adrenals/Urinary Tract: Normal bilateral adrenal glands. Bilateral renal cysts, the largest on the right is interpolar and posterior with punctate calcification associated with it. It measures up to 4.3 cm in diameter. The left kidney demonstrates a 4.5 cm in diameter upper pole renal cyst with smaller interpolar cysts noted. No hydroureteronephrosis. The urinary bladder is decompressed in appearance. Stomach/Bowel: The stomach is decompressed in appearance. The duodenal sweep and ligament of Treitz are normal. No small bowel obstruction, mass or inflammation. Normal appearing appendix. Nonobstructed large bowel without inflammatory change. Scattered colonic diverticulosis without acute diverticulitis is identified along the descending sigmoid colon. Vascular/Lymphatic: 4.4 cm infrarenal fusiform abdominal aortic aneurysm with moderate aortoiliac and branch vessel atherosclerosis. Findings are unchanged from recent comparison. No lymphadenopathy by CT size criteria. Reproductive: Normal size prostate and seminal vesicles. Nonspecific metallic density projects between the rectum posterior wall of bladder. Other: No free air or free fluid. 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 IMPRESSION: 1. 4.4 cm infrarenal fusiform abdominal aortic aneurysm. Recommend followup by ultrasound in 1 year. This recommendation follows ACR consensus guidelines: White Paper of the ACR Incidental Findings Committee II on Vascular Findings. J Am Coll Radiol 2013; 10:789-794. 2. Bilateral simple and complex renal cysts. 3. Uncomplicated cholelithiasis. 4. Colonic diverticulosis without acute diverticulitis. 5. Vertebral plana with marked  height loss of L1, chronic in appearance. Electronically Signed   By: Ashley Royalty M.D.   On: 09/04/2018 17:56   Dg Chest Port 1 View  Result Date: 09/04/2018 CLINICAL DATA:  Fever and altered mental status. EXAM: PORTABLE CHEST 1 VIEW COMPARISON:  08/18/2018 FINDINGS: Cardiomegaly and AICD again noted. This is a low volume film. RIGHT basilar opacity identified-favor atelectasis over airspace disease/pneumonia. No pleural effusion or pneumothorax. No acute bony abnormality. IMPRESSION: RIGHT basilar opacity-favor atelectasis over airspace disease/pneumonia, but correlate clinically. Cardiomegaly. Electronically Signed   By: Margarette Canada M.D.   On: 09/04/2018 14:59    EKG: Independently reviewed.  Sinus tachycardia, IVCD, early repolarization changes inferior leads  Assessment/Plan Principal Problem:   Sepsis [facility]) Active Problems:   Nonischemic cardiomyopathy ([place])   Hyperlipidemia   S/P ICD (internal cardiac defibrillator) procedure, 01/11/14 removal of ERI gen and placement of Medtronic Evera XT VR & NEW  Right ventricular lead Medtronic   Ventricular tachycardia ([facility])   S/P TAVR (transcatheter aortic valve replacement)   William [name] is a 84 y.o. male with medical history significant for Chronic Systolic CHF (EF 34-03%), ICM, Severe AS s/p TAVR 08/18/18, Hx of ventricular tachycardia s/p ICD, AAA, hyperlipidemia, prostate cancer with recurrent UTI, and degenerative arthritis who presents to the hospital with sudden onset of fevers, chills, and fatigue.  Sepsis, unknown source: Underwent TAVR on 08/18/18, repeat TTE without evidence of vegetation and ICD in place without obvious sign of infection.  CT abdomen/pelvis showed uncomplicated cholelithiasis.  He has elevated LFTs and total bilirubin and on my exam has right upper quadrant pain.  Will obtain RUQ U/S for further evaluation.  Urinalysis is negative for UTI. CXR with questionable right basilar atelectasis versus pneumonia. -Continue  Vancomycin/Cefepime/Metronidazole, narrow as able -RUQ U/S -Check flu panel -Follow up blood cultures  Severe aortic stenosis s/p TAVR 08/18/2018: TAVR bioprosthesis in place by TTE, no evidence of vegetation. -Continue aspirin and Plavix -Cardiothoracic surgery consulted  Chronic systolic CHF: EF 70-96% by TTE 09/04/2018.  Home metoprolol was held after TAVR procedure due to hypotension.  Does not appear to have acute decomposition of his CHF. -Holding Toprol-XL and Lasix with hypotension -Strict I/O's, daily weights  History of ventricular tachycardia s/p ICD: -Continue amiodarone  Hyperlipidemia: -Continue home ezetimibe-simvastatin  Infrarenal AAA: 4.4 cm infrarenal fusiform AAA seen on CT abdomen/pelvis.  Follow with cardiothoracic surgery.  DVT prophylaxis: Lovenox Code Status: Full code, confirmed with patient and family Family Communication: Wife and daughter at bedside Disposition Plan: Pending sepsis work-up and management Consults called: Cardiothoracic surgery consulted in ED Admission status: Inpatient   Zada Finders MD [facility] Pager [phone]  If 7PM-7AM, please contact night-coverage [email or web address] Password [facility]  09/04/2018, 9:13 PM

## 2018-09-04 NOTE — Progress Notes (Signed)
  Echocardiogram 2D Echocardiogram has been performed.  William [name] 09/04/2018, 5:29 PM

## 2018-09-04 NOTE — ED Notes (Signed)
Patient transported to Ultrasound 

## 2018-09-04 NOTE — ED Provider Notes (Signed)
[facility] EMERGENCY DEPARTMENT Provider Note   CSN: [id number] Arrival date & time: 09/04/18  1417     History   Chief Complaint Chief Complaint  Patient presents with  . Blood Infection  . Fatigue  Level 5 caveat due to of situation,, dementia.  History is obtained from paramedics and from patient's wife  HPI HUCK ASHWORTH is a 84 y.o. male.  She was doing well until he became less responsive and developed shaking chills approximate 1 hour ago.Marland Kitchen  He denies any cough.  No treatment prior to coming here.  No other associated symptoms.  His wife reports that at baseline he is alert and talkative somewhat confused due to dementia.  EMS noted patient to be febrile with a temperature of approximately 101 degrees.  HPI  Past Medical History:  Diagnosis Date  . AAA (abdominal aortic aneurysm) ([place])    7/13 3.8cm  . Arthritis    "joints" (01/11/2014)  . Asthma    "seasonal; some foods"   . Chronic systolic CHF (congestive heart failure) ([place])   . Dementia ([place])   . HLD (hyperlipidemia)   . HOH (hard of hearing)   . Lumbar vertebral fracture ([facility])    L1- 04/11/2014   . Prostate cancer ([place])   . S/P ICD (internal cardiac defibrillator) procedure, 01/11/14 removal of ERI gen and placement of Medtronic Evera XT VR & NEW Right ventricular lead Medtronic 01/12/2014  . S/P TAVR (transcatheter aortic valve replacement)    Edwards Sapien 3 THV (size 29 mm, model # B6411258, serial # [id number])  . Severe aortic stenosis   . Sleep apnea    "lost 60# & don't have it anymore" (01/11/2014)  . Ventricular tachycardia [facility])     Patient Active Problem List   Diagnosis Date Noted  . S/P TAVR (transcatheter aortic valve replacement) 08/18/2018  . Ventricular tachycardia ([place])   . Dementia ([place])   . Severe aortic stenosis   . Acute on chronic systolic heart failure ([place]) 07/02/2018  . Prostate cancer ([place]) 01/20/2014  . S/P ICD (internal cardiac defibrillator) procedure,  01/11/14 removal of ERI gen and placement of Medtronic Evera XT VR & NEW Right ventricular lead Medtronic 01/12/2014  . Nonischemic cardiomyopathy ([place]) 05/28/2013  . Hyperlipidemia 05/28/2013  . AAA (abdominal aortic aneurysm) ([place]) 05/28/2013    Past Surgical History:  Procedure Laterality Date  . CARDIAC CATHETERIZATION  08/20/2004   noncritical CAD,mild global hypokinesis, EF 50%  . CARDIAC DEFIBRILLATOR PLACEMENT  08/23/2004   Medtronic  . CATARACT EXTRACTION W/ INTRAOCULAR LENS IMPLANT Right   . COLECTOMY  1990's  . CRYOABLATION N/A 02/24/2014   Procedure: CRYO ABLATION PROSTATE;  Surgeon: Ailene Rud, MD;  [facility]: [facility];  Service: Urology;  Laterality: N/A;  . HERNIA REPAIR     "abdomen; from colon OR"  . IMPLANTABLE CARDIOVERTER DEFIBRILLATOR (ICD) GENERATOR CHANGE N/A 01/11/2014   Procedure: ICD GENERATOR CHANGE;  Surgeon: Sanda Klein, MD;  Location: [facility] CATH LAB;  Service: Cardiovascular;  Laterality: N/A;  . LEAD REVISION N/A 01/11/2014   Procedure: LEAD REVISION;  Surgeon: Sanda Klein, MD;  Location: [facility] CATH LAB;  Service: Cardiovascular;  Laterality: N/A;  . NM MYOCAR PERF WALL MOTION  01/29/2012   abnormal c/o infarct/scar,no ischemia present  . PROSTATE BIOPSY N/A 11/22/2013   Procedure: PROSTATE BIOPSY AND ULTRASOUND;  Surgeon: Ailene Rud, MD;  [facility];  Service: Urology;  Laterality: N/A;  . RIGHT/LEFT HEART CATH AND CORONARY ANGIOGRAPHY N/A 07/06/2018  Procedure: RIGHT/LEFT HEART CATH AND CORONARY ANGIOGRAPHY;  Surgeon: Troy Sine, MD;  Location: [facility] CV LAB;  Service: Cardiovascular;  Laterality: N/A;  . TEE WITHOUT CARDIOVERSION N/A 08/18/2018   Procedure: TRANSESOPHAGEAL ECHOCARDIOGRAM (TEE);  Surgeon: Burnell Blanks, MD;  Location: [facility];  Service: Open Heart Surgery;  Laterality: N/A;  . TRANSCATHETER AORTIC VALVE REPLACEMENT, TRANSFEMORAL  08/18/2018  . TRANSCATHETER AORTIC VALVE REPLACEMENT, TRANSFEMORAL N/A  08/18/2018   Procedure: TRANSCATHETER AORTIC VALVE REPLACEMENT, TRANSFEMORAL using an Edwards 81mm Aortic Valve;  Surgeon: Burnell Blanks, MD;  Location: [facility];  Service: Open Heart Surgery;  Laterality: N/A;        Home Medications    Prior to Admission medications   Medication Sig Start Date End Date Taking? Authorizing Provider  acetaminophen (TYLENOL) 500 MG tablet Take 500 mg by mouth every 6 (six) hours as needed (for pain).     [provider]  amiodarone (PACERONE) 200 MG tablet TAKE 1/2 TABLET BY MOUTH EVERY DAY 08/14/18   Croitoru, Dani Gobble, MD  amoxicillin (AMOXIL) 500 MG capsule Take 4 tablets (2000 mg) 30-60 min prior to dental procedures 08/27/18   Eileen Stanford, PA-C  aspirin 81 MG chewable tablet Chew 1 tablet (81 mg total) by mouth daily. 07/08/18   Kroeger, Lorelee Cover., PA-C  cholecalciferol (VITAMIN D) 1000 units tablet Take 2,000 Units by mouth at bedtime.    [provider]  clopidogrel (PLAVIX) 75 MG tablet Take 1 tablet (75 mg total) by mouth daily with breakfast. 08/21/18   Eileen Stanford, PA-C  ezetimibe-simvastatin (VYTORIN) 10-20 MG per tablet Take 1 tablet by mouth at bedtime.    [provider]  furosemide (LASIX) 20 MG tablet Take 1 tablet (20 mg total) by mouth daily. 07/08/18 10/06/18  Croitoru, Mihai, MD  metoprolol succinate (TOPROL-XL) 50 MG 24 hr tablet Take 1 tablet (50 mg total) by mouth daily. Take with or immediately following a meal. 09/01/18   Eileen Stanford, PA-C  Multiple Vitamins-Minerals (PRESERVISION AREDS 2 PO) Take 1 tablet by mouth daily.    [provider]  polyethylene glycol (MIRALAX / GLYCOLAX) packet Take 17 g by mouth every evening. 01/21/17   Croitoru, Mihai, MD  potassium chloride (K-DUR) 10 MEQ tablet Take 1 tablet (10 mEq total) by mouth daily. 08/20/18   Eileen Stanford, PA-C  Skin Protectants, Misc. (AQUAPHOR LIP REPAIR) OINT Apply 1 application topically as needed.    [provider]  Triamcinolone Acetonide (TRIAMCINOLONE 0.1 % CREAM : EUCERIN) CREA Apply 1 application topically as needed for rash or itching.    [provider]    Family History Family History  Problem Relation Age of Onset  . Heart failure Mother        Died of "old age" at 62  . Pneumonia Father     Social History Social History   Tobacco Use  . Smoking status: Never Smoker  . Smokeless tobacco: Never Used  Substance Use Topics  . Alcohol use: Yes    Alcohol/week: 0.0 standard drinks    Comment: 01/11/2014 "drink 1/2 of a beer twice per month   . Drug use: No     Allergies   Crestor [rosuvastatin] and Lipitor [atorvastatin]   Review of Systems Review of Systems  Unable to perform ROS: Dementia  Constitutional: Positive for chills.     Physical Exam Updated Vital Signs There were no vitals taken for this visit.  Physical Exam  Constitutional:  Ill-appearing  HENT:  Head: Normocephalic and atraumatic.  Eyes: Pupils are equal, round, and reactive to light. Conjunctivae are normal.  Neck: Neck supple. No tracheal deviation present. No thyromegaly present.  Cardiovascular: Regular rhythm.  No murmur heard. Tachycardic  Pulmonary/Chest: Effort normal and breath sounds normal.  Abdominal: Soft. Bowel sounds are normal. He exhibits no distension. There is no tenderness.  Musculoskeletal: He exhibits edema. He exhibits no tenderness.  1Plus pretibial pitting edema bilaterally  Neurological: He is alert. Coordination normal.  Skin: Skin is warm and dry. Rash noted.  Petechial rash on legs  Nursing note and vitals reviewed.    ED Treatments / Results  Labs (all labs ordered are listed, but only abnormal results are displayed) Labs Reviewed  CULTURE, BLOOD (ROUTINE X 2)  CULTURE, BLOOD (ROUTINE X 2)  COMPREHENSIVE METABOLIC PANEL  CBC WITH DIFFERENTIAL/PLATELET  URINALYSIS, ROUTINE W REFLEX MICROSCOPIC  I-STAT CG4 LACTIC ACID, ED     EKG None  Radiology No results found.  Procedures Procedures (including critical care time)  Medications Ordered in ED Medications  ceFEPIme (MAXIPIME) 2 g in sodium chloride 0.9 % 100 mL IVPB (has no administration in time range)  metroNIDAZOLE (FLAGYL) IVPB 500 mg (has no administration in time range)  vancomycin (VANCOCIN) IVPB 1000 mg/200 mL premix (has no administration in time range)     Initial Impression / Assessment and Plan / ED Course  I have reviewed the triage vital signs and the nursing notes.  Pertinent labs & imaging results that were available during my care of the patient were reviewed by me and considered in my medical decision making (see chart for details).     Code sepsis called based on sirs criteria of temperature and respiratory rate and pulse.  Source of infection uncertain at this time. Lab work consistent with elevated hyperglycemia, transaminases and hyperbilirubinemia.  CBC remarkable for thrombocytopenia, unchanged from 2 weeks ago blood lactate elevated.  I consulted Dr. Roxy Manns from thoracic surgery who will consult on case.  Abdominal and pelvic CT scan pending.  At 4:35 PM patient is opens eyes to verbal stimulus and is alert.  After treatment with intravenous antibiotics moves all extremities.  Abdomen remains nontender. Sepsis - Repeat Assessment  Performed at:    435 pm  Vitals     Blood pressure (!) 103/56, pulse (!) 124, temperature (!) 101.4 F (38.6 C), temperature source Rectal, resp. rate (!) 26, SpO2 95 %.  Heart:     Tachycardic  Lungs:    CTA  Capillary Refill:   <2 sec  Peripheral Pulse:   Radial pulse palpable  Skin:     Normal Color Pt signed out to Dr. Lacinda Axon 435 pm  Final Clinical Impressions(s) / ED Diagnoses  Diagnoses #1  sepsis Final diagnoses:  None  #2 thrombocytopenia  ED Discharge Orders    None    CRITICAL CARE Performed by: Orlie Dakin Total critical care time: 45 minutes Critical care time was  exclusive of separately billable procedures and treating other patients. Critical care was necessary to treat or prevent imminent or life-threatening deterioration. Critical care was time spent personally by me on the following activities: development of treatment plan with patient and/or surrogate as well as nursing, discussions with consultants, evaluation of patient's response to treatment, examination of patient, obtaining history from patient or surrogate, ordering and performing treatments and interventions, ordering and review of laboratory studies, ordering and review of radiographic studies, pulse oximetry and re-evaluation of patient's condition.   Aurelio Mccamy,  Ediel Unangst, MD 09/04/18 [id number]

## 2018-09-04 NOTE — ED Notes (Signed)
Echocardiogram tech bedside

## 2018-09-04 NOTE — ED Triage Notes (Signed)
Pt arrived via [facility] ems from home. Family states pt began shivering within the last 2 hours. Pt had recent valve replacement and sepsis dx. Pt appears very lethargic; not maintaining posture on stretcher. Wife at bedside. EDP present for report. Pt vomited during triage. EMS v/s 110/76, hr ranging from 80-120 PTA, temp 101.x.

## 2018-09-04 NOTE — Consult Note (Addendum)
[address]       [place] [address]             [phone]      Reason for Consult: Recent TAVR Referring Physician: Albertine Patricia, MD  William [name] is an 84 y.o. male.  HPI: Patient is an 84 year old male with a history of AAA, and ICM/chronic systolic congestive heart failure status post ICD, ventricular tachycardia, OSA, HLD, degenerative arthritis/lumbar vertebral fracture with reduced mobility and severe aortic stenosis who recently presented to [facility] on 08/18/2018 for planned TAVR.  Was recently admitted by Dr. Lisa Roca with progressive worsening shortness of breath and lower extremity edema over a several week time period.  He underwent diuresis with significant improvement of his lower extremity edema as well as his shortness of breath.  Echocardiogram was performed during the hospitalization and showed a mean aortic valve gradient of 21 mmHg.  A peak gradient was 53 mmHg and dimensionless index was 0.16.  Aortic valve area was 0.66 cm.  Left ventricular ejection fraction was 20 to 25%.  He underwent full evaluation and was deemed to be a candidate for TAVR this was performed on 08/18/2018 by Drs. Bartle and [facility].  It is notable left prior to the procedure he required a time.  To improve from urosepsis.  Postoperative echocardiogram showed EF of 30% with normal functioning TAVR with no PVL.  His mean gradient was 9 millimeters mercury.  Please see full report for further details.  He was discharged on aspirin and Plavix.  He presented to the emergency department today via EMS with shaking chills.  Per his family this morning he was doing remarkably well until approximately 1 PM when everything changed very quickly.  He had been ambulating well and doing his normal activities.  On arrival he was very lethargic having difficulty maintaining his posture on the stretcher.  His initial temperature was 101.  Does have a history of dementia.  It is  difficult to obtain but reportedly he is usually much more alert.  He is felt to require admission for further management of possible sepsis.  Past Medical History:  Diagnosis Date  . AAA (abdominal aortic aneurysm) ([place])    7/13 3.8cm  . Arthritis    "joints" (01/11/2014)  . Asthma    "seasonal; some foods"   . Chronic systolic CHF (congestive heart failure) ([place])   . Dementia ([place])   . HLD (hyperlipidemia)   . HOH (hard of hearing)   . Lumbar vertebral fracture ([facility])    L1- 04/11/2014   . Prostate cancer ([place])   . S/P ICD (internal cardiac defibrillator) procedure, 01/11/14 removal of ERI gen and placement of Medtronic Evera XT VR & NEW Right ventricular lead Medtronic 01/12/2014  . S/P TAVR (transcatheter aortic valve replacement)    Edwards Sapien 3 THV (size 29 mm, model # [id number], serial # [id number])  . Severe aortic stenosis   . Sleep apnea    "lost 60# & don't have it anymore" (01/11/2014)  . Ventricular tachycardia [facility])     Past Surgical History:  Procedure Laterality Date  . CARDIAC CATHETERIZATION  08/20/2004   noncritical CAD,mild global hypokinesis, EF 50%  . CARDIAC DEFIBRILLATOR PLACEMENT  08/23/2004   Medtronic  . CATARACT EXTRACTION W/ INTRAOCULAR LENS IMPLANT Right   . COLECTOMY  1990's  . CRYOABLATION N/A 02/24/2014   Procedure: CRYO ABLATION PROSTATE;  Surgeon: Ailene Rud, MD;  [facility];  Service: Urology;  Laterality: N/A;  . HERNIA REPAIR     "abdomen; from colon OR"  . IMPLANTABLE CARDIOVERTER DEFIBRILLATOR (ICD) GENERATOR CHANGE N/A 01/11/2014   Procedure: ICD GENERATOR CHANGE;  Surgeon: Sanda Klein, MD;  Location: [facility] CATH LAB;  Service: Cardiovascular;  Laterality: N/A;  . LEAD REVISION N/A 01/11/2014   Procedure: LEAD REVISION;  Surgeon: Sanda Klein, MD;  Location: [facility] CATH LAB;  Service: Cardiovascular;  Laterality: N/A;  . NM MYOCAR PERF WALL MOTION  01/29/2012   abnormal c/o infarct/scar,no ischemia present  . PROSTATE BIOPSY N/A  11/22/2013   Procedure: PROSTATE BIOPSY AND ULTRASOUND;  Surgeon: Ailene Rud, MD;  [facility];  Service: Urology;  Laterality: N/A;  . RIGHT/LEFT HEART CATH AND CORONARY ANGIOGRAPHY N/A 07/06/2018   Procedure: RIGHT/LEFT HEART CATH AND CORONARY ANGIOGRAPHY;  Surgeon: Troy Sine, MD;  Location: [facility] CV LAB;  Service: Cardiovascular;  Laterality: N/A;  . TEE WITHOUT CARDIOVERSION N/A 08/18/2018   Procedure: TRANSESOPHAGEAL ECHOCARDIOGRAM (TEE);  Surgeon: Burnell Blanks, MD;  Location: [facility];  Service: Open Heart Surgery;  Laterality: N/A;  . TRANSCATHETER AORTIC VALVE REPLACEMENT, TRANSFEMORAL  08/18/2018  . TRANSCATHETER AORTIC VALVE REPLACEMENT, TRANSFEMORAL N/A 08/18/2018   Procedure: TRANSCATHETER AORTIC VALVE REPLACEMENT, TRANSFEMORAL using an Edwards 66m Aortic Valve;  Surgeon: MBurnell Blanks MD;  Location: [facility]  Service: Open Heart Surgery;  Laterality: N/A;    Family History  Problem Relation Age of Onset  . Heart failure Mother        Died of "old age" at 980 . Pneumonia Father     Social History:  reports that he has never smoked. He has never used smokeless tobacco. He reports that he drinks alcohol. He reports that he does not use drugs.  Allergies:  Allergies  Allergen Reactions  . Crestor [Rosuvastatin] Other (See Comments)    Hurts muscles  . Lipitor [Atorvastatin] Other (See Comments)    Hurts stomach     Current Facility-Administered Medications:  .  [START ON 09/05/2018] ceFEPIme (MAXIPIME) 2 g in sodium chloride 0.9 % 100 mL IVPB, 2 g, Intravenous, Q24H, FWillia Craze Student-PharmD .  metroNIDAZOLE (FLAGYL) IVPB 500 mg, 500 mg, Intravenous, Q8H, Jacubowitz, Sam, MD, Last Rate: 100 mL/hr at 09/04/18 1534, 500 mg at 09/04/18 1534 .  [START ON 09/05/2018] vancomycin (VANCOCIN) 1,250 mg in sodium chloride 0.9 % 250 mL IVPB, 1,250 mg, Intravenous, Q24H, FWillia Craze Student-PharmD .  vancomycin (VANCOCIN) 2,000 mg in  sodium chloride 0.9 % 500 mL IVPB, 2,000 mg, Intravenous, Once, [place], RValeda Malm RPH  Current Outpatient Medications:  .  acetaminophen (TYLENOL) 500 MG tablet, Take 500 mg by mouth every 6 (six) hours as needed (for pain). , Disp: , Rfl:  .  amiodarone (PACERONE) 200 MG tablet, TAKE 1/2 TABLET BY MOUTH EVERY DAY, Disp: 45 tablet, Rfl: 0 .  amoxicillin (AMOXIL) 500 MG capsule, Take 4 tablets (2000 mg) 30-60 min prior to dental procedures, Disp: 8 capsule, Rfl: 2 .  aspirin 81 MG chewable tablet, Chew 1 tablet (81 mg total) by mouth daily., Disp: 90 tablet, Rfl: 3 .  cholecalciferol (VITAMIN D) 1000 units tablet, Take 2,000 Units by mouth at bedtime., Disp: , Rfl:  .  clopidogrel (PLAVIX) 75 MG tablet, Take 1 tablet (75 mg total) by mouth daily with breakfast., Disp: 90 tablet, Rfl: 1 .  ezetimibe-simvastatin (VYTORIN) 10-20 MG per tablet, Take 1 tablet by mouth at bedtime., Disp: , Rfl:  .  furosemide (LASIX)  20 MG tablet, Take 1 tablet (20 mg total) by mouth daily., Disp: 90 tablet, Rfl: 3 .  metoprolol succinate (TOPROL-XL) 50 MG 24 hr tablet, Take 1 tablet (50 mg total) by mouth daily. Take with or immediately following a meal., Disp: 30 tablet, Rfl: 6 .  Multiple Vitamins-Minerals (PRESERVISION AREDS 2 PO), Take 1 tablet by mouth daily., Disp: , Rfl:  .  polyethylene glycol (MIRALAX / GLYCOLAX) packet, Take 17 g by mouth every evening., Disp: 14 each, Rfl: 1 .  potassium chloride (K-DUR) 10 MEQ tablet, Take 1 tablet (10 mEq total) by mouth daily., Disp: 30 tablet, Rfl: 6 .  Skin Protectants, Misc. (AQUAPHOR LIP REPAIR) OINT, Apply 1 application topically as needed., Disp: , Rfl:  .  Triamcinolone Acetonide (TRIAMCINOLONE 0.1 % CREAM : EUCERIN) CREA, Apply 1 application topically as needed for rash or itching., Disp: , Rfl:   Results for orders placed or performed during the hospital encounter of 09/04/18 (from the past 48 hour(s))  Comprehensive metabolic panel     Status: Abnormal    Collection Time: 09/04/18  2:25 PM  Result Value Ref Range   Sodium 134 (L) 135 - 145 mmol/L   Potassium 3.5 3.5 - 5.1 mmol/L   Chloride 99 98 - 111 mmol/L   CO2 22 22 - 32 mmol/L   Glucose, Bld 255 (H) 70 - 99 mg/dL   BUN 15 8 - 23 mg/dL   Creatinine, Ser 1.16 0.61 - 1.24 mg/dL   Calcium 8.6 (L) 8.9 - 10.3 mg/dL   Total Protein 7.0 6.5 - 8.1 g/dL   Albumin 3.3 (L) 3.5 - 5.0 g/dL   AST 249 (H) 15 - 41 U/L   ALT 164 (H) 0 - 44 U/L   Alkaline Phosphatase 356 (H) 38 - 126 U/L   Total Bilirubin 3.7 (H) 0.3 - 1.2 mg/dL   GFR calc non Af Amer 54 (L) >60 mL/min   GFR calc Af Amer >60 >60 mL/min    Comment: (NOTE) The eGFR has been calculated using the CKD EPI equation. This calculation has not been validated in all clinical situations. eGFR's persistently <60 mL/min signify possible Chronic Kidney Disease.    Anion gap 13 5 - 15    Comment: Performed at [facility] [address], [place],  [address]  CBC WITH DIFFERENTIAL     Status: Abnormal   Collection Time: 09/04/18  2:25 PM  Result Value Ref Range   WBC 6.4 4.0 - 10.5 K/uL   RBC 3.87 (L) 4.22 - 5.81 MIL/uL   Hemoglobin 13.0 13.0 - 17.0 g/dL   HCT 36.9 (L) 39.0 - 52.0 %   MCV 95.3 80.0 - 100.0 fL   MCH 33.6 26.0 - 34.0 pg   MCHC 35.2 30.0 - 36.0 g/dL   RDW 15.9 (H) 11.5 - 15.5 %   Platelets 108 (L) 150 - 400 K/uL    Comment: REPEATED TO VERIFY Immature Platelet Fraction may be clinically indicated, consider ordering this additional test [id number] CONSISTENT WITH PREVIOUS RESULT    nRBC 0.0 0.0 - 0.2 %   Neutrophils Relative % 87 %   Neutro Abs 5.6 1.7 - 7.7 K/uL   Lymphocytes Relative 5 %   Lymphs Abs 0.3 (L) 0.7 - 4.0 K/uL   Monocytes Relative 5 %   Monocytes Absolute 0.3 0.1 - 1.0 K/uL   Eosinophils Relative 2 %   Eosinophils Absolute 0.1 0.0 - 0.5 K/uL   Basophils Relative 0 %  Basophils Absolute 0.0 0.0 - 0.1 K/uL   Immature Granulocytes 1 %   Abs Immature Granulocytes 0.03 0.00 - 0.07 K/uL     Comment: Performed at [facility], [place] [address], [place], [place] [address]  Urinalysis, Routine w reflex microscopic     Status: None   Collection Time: 09/04/18  2:25 PM  Result Value Ref Range   Color, Urine YELLOW YELLOW   APPearance CLEAR CLEAR   Specific Gravity, Urine 1.010 1.005 - 1.030   pH 6.0 5.0 - 8.0   Glucose, UA NEGATIVE NEGATIVE mg/dL   Hgb urine dipstick NEGATIVE NEGATIVE   Bilirubin Urine NEGATIVE NEGATIVE   Ketones, ur NEGATIVE NEGATIVE mg/dL   Protein, ur NEGATIVE NEGATIVE mg/dL   Nitrite NEGATIVE NEGATIVE   Leukocytes, UA NEGATIVE NEGATIVE    Comment: Performed at [facility] [address] [place], [place] [address]  I-Stat CG4 Lactic Acid, ED     Status: Abnormal   Collection Time: 09/04/18  2:49 PM  Result Value Ref Range   Lactic Acid, Venous 3.64 (HH) 0.5 - 1.9 mmol/L   Comment NOTIFIED PHYSICIAN     Dg Chest Port 1 View  Result Date: 09/04/2018 CLINICAL DATA:  Fever and altered mental status. EXAM: PORTABLE CHEST 1 VIEW COMPARISON:  08/18/2018 FINDINGS: Cardiomegaly and AICD again noted. This is a low volume film. RIGHT basilar opacity identified-favor atelectasis over airspace disease/pneumonia. No pleural effusion or pneumothorax. No acute bony abnormality. IMPRESSION: RIGHT basilar opacity-favor atelectasis over airspace disease/pneumonia, but correlate clinically. Cardiomegaly. Electronically Signed   By: Margarette Canada M.D.   On: 09/04/2018 14:59    Review of Systems  Unable to perform ROS: Dementia  Constitutional: Positive for chills and fever.  Skin: Positive for rash.  Also acute illness has made him much more lethargic.  Family reports the only significant difference is the lethargy as well as the shaking chills he developed. Blood pressure (!) 103/56, pulse (!) 124, temperature (!) 101.4 F (38.6 C), temperature source Rectal, resp. rate (!) 26, SpO2 95 %. Physical Exam  Constitutional: He appears well-developed and  well-nourished. No distress.  Elderly male, lethargic, disoriented.  HENT:  Head: Normocephalic and atraumatic.  Mouth/Throat: Oropharynx is clear and moist. No oropharyngeal exudate.  Eyes: Right eye exhibits no discharge. Left eye exhibits discharge. No scleral icterus.  Neck: Neck supple. No JVD present. Tracheal deviation present. No thyromegaly present.  Cardiovascular: Normal rate, regular rhythm and normal heart sounds.  Respiratory: No respiratory distress. He has no wheezes. He has no rales. He exhibits no tenderness.  GI: Soft. He exhibits no distension and no mass. There is no tenderness. There is no rebound and no guarding.  Musculoskeletal: He exhibits edema.  Lymphadenopathy:    He has no cervical adenopathy.  Skin: Skin is warm and dry. He is not diaphoretic. No erythema.  He does have a erythematous nonraised rash on his right lower extremity which reportedly is new    Assessment/Plan: Presumed sepsis of uncertain etiology.  He will be admitted by the medical team and undergo a full diagnostic evaluation in addition to broad-spectrum antibiotics.  He will probably require an echocardiogram as part of the work-up to rule out any potential endocarditis source.  It is noted that he has significant elevation of his LFTs and total bilirubin.  His lactic acid is 3.64.  There is some basilar atelectasis/infiltrate noted on his chest x-ray in the right lower fields.  [place] 09/04/2018, 4:00  PM    I have seen and examined the patient and agree with the assessment and plan as outlined.  By report blood cultures and urine culture have been sent.  Will get ECHO.  Plan per medical team.    Rexene Alberts, MD 09/04/2018 4:44 PM

## 2018-09-04 NOTE — Progress Notes (Signed)
Pharmacy Antibiotic Note  William [name] is a 84 y.o. male admitted on 09/04/2018 with sepsis of unknown source.  Pharmacy has been consulted for vancomycin and cefepime dosing.  Currently, febrile (Tmax 101.4) and WBX wnl. Scr 1.16 mg/dL; estimated normalized CrCl 44.8 mL/min.   Plan: Cefepime 2 gm IV q24h Vancomycin 2000 mg IV once, then 1250 mg IV q24h Metronidazole 500 mg IV q8h Monitor renal function and vancomycin trough at steady-state F/u cultures and LOT     Temp (24hrs), Avg:101.4 F (38.6 C), Min:101.4 F (38.6 C), Max:101.4 F (38.6 C)  No results for input(s): WBC, CREATININE, LATICACIDVEN, VANCOTROUGH, VANCOPEAK, VANCORANDOM, GENTTROUGH, GENTPEAK, GENTRANDOM, TOBRATROUGH, TOBRAPEAK, TOBRARND, AMIKACINPEAK, AMIKACINTROU, AMIKACIN in the last 168 hours.  Estimated Creatinine Clearance: 62.8 mL/min (by C-G formula based on SCr of 0.96 mg/dL).    Allergies  Allergen Reactions  . Crestor [Rosuvastatin] Other (See Comments)    Hurts muscles  . Lipitor [Atorvastatin] Other (See Comments)    Hurts stomach    Antimicrobials this admission: Cefepime 11/22>> Vancomycin 11/22>> Metronidazole 11/22>>   Microbiology results: 11/22 BCx: sent  Thank you for allowing pharmacy to be a part of this patient's care.  Willia Craze, Pharmacy Student

## 2018-09-04 NOTE — ED Notes (Signed)
Lab contacted regarding blood samples sent earlier, RN informed blood was not able to be located. Phlebotomy assisting in redraw.

## 2018-09-04 NOTE — ED Notes (Signed)
Sent UA and urine culture to lab.

## 2018-09-05 ENCOUNTER — Other Ambulatory Visit: Payer: Self-pay

## 2018-09-05 ENCOUNTER — Encounter (HOSPITAL_COMMUNITY): Payer: Self-pay

## 2018-09-05 DIAGNOSIS — B961 Klebsiella pneumoniae [K. pneumoniae] as the cause of diseases classified elsewhere: Secondary | ICD-10-CM

## 2018-09-05 DIAGNOSIS — Z87440 Personal history of urinary (tract) infections: Secondary | ICD-10-CM

## 2018-09-05 DIAGNOSIS — K5790 Diverticulosis of intestine, part unspecified, without perforation or abscess without bleeding: Secondary | ICD-10-CM

## 2018-09-05 DIAGNOSIS — B9689 Other specified bacterial agents as the cause of diseases classified elsewhere: Secondary | ICD-10-CM

## 2018-09-05 DIAGNOSIS — N281 Cyst of kidney, acquired: Secondary | ICD-10-CM

## 2018-09-05 DIAGNOSIS — R7881 Bacteremia: Secondary | ICD-10-CM

## 2018-09-05 DIAGNOSIS — Z952 Presence of prosthetic heart valve: Secondary | ICD-10-CM

## 2018-09-05 DIAGNOSIS — I5022 Chronic systolic (congestive) heart failure: Secondary | ICD-10-CM

## 2018-09-05 DIAGNOSIS — I714 Abdominal aortic aneurysm, without rupture: Secondary | ICD-10-CM

## 2018-09-05 DIAGNOSIS — K8020 Calculus of gallbladder without cholecystitis without obstruction: Secondary | ICD-10-CM

## 2018-09-05 DIAGNOSIS — Z95810 Presence of automatic (implantable) cardiac defibrillator: Secondary | ICD-10-CM

## 2018-09-05 LAB — BLOOD CULTURE ID PANEL (REFLEXED)
ACINETOBACTER BAUMANNII: NOT DETECTED
CANDIDA ALBICANS: NOT DETECTED
CANDIDA GLABRATA: NOT DETECTED
CANDIDA KRUSEI: NOT DETECTED
CANDIDA PARAPSILOSIS: NOT DETECTED
Candida tropicalis: NOT DETECTED
Carbapenem resistance: NOT DETECTED
ENTEROBACTER CLOACAE COMPLEX: NOT DETECTED
ENTEROBACTERIACEAE SPECIES: DETECTED — AB
ENTEROCOCCUS SPECIES: NOT DETECTED
Escherichia coli: NOT DETECTED
Haemophilus influenzae: NOT DETECTED
KLEBSIELLA PNEUMONIAE: DETECTED — AB
Klebsiella oxytoca: NOT DETECTED
Listeria monocytogenes: NOT DETECTED
Neisseria meningitidis: NOT DETECTED
PSEUDOMONAS AERUGINOSA: NOT DETECTED
Proteus species: NOT DETECTED
STREPTOCOCCUS PYOGENES: NOT DETECTED
STREPTOCOCCUS SPECIES: NOT DETECTED
Serratia marcescens: NOT DETECTED
Staphylococcus aureus (BCID): NOT DETECTED
Staphylococcus species: NOT DETECTED
Streptococcus agalactiae: NOT DETECTED
Streptococcus pneumoniae: NOT DETECTED

## 2018-09-05 LAB — CBC
HCT: 31.7 % — ABNORMAL LOW (ref 39.0–52.0)
HEMOGLOBIN: 11.1 g/dL — AB (ref 13.0–17.0)
MCH: 33.8 pg (ref 26.0–34.0)
MCHC: 35 g/dL (ref 30.0–36.0)
MCV: 96.6 fL (ref 80.0–100.0)
Platelets: 89 10*3/uL — ABNORMAL LOW (ref 150–400)
RBC: 3.28 MIL/uL — AB (ref 4.22–5.81)
RDW: 15.8 % — ABNORMAL HIGH (ref 11.5–15.5)
WBC: 8.1 10*3/uL (ref 4.0–10.5)
nRBC: 0 % (ref 0.0–0.2)

## 2018-09-05 LAB — INFLUENZA PANEL BY PCR (TYPE A & B)
INFLAPCR: NEGATIVE
Influenza B By PCR: NEGATIVE

## 2018-09-05 LAB — COMPREHENSIVE METABOLIC PANEL
ALBUMIN: 2.8 g/dL — AB (ref 3.5–5.0)
ALT: 148 U/L — ABNORMAL HIGH (ref 0–44)
ANION GAP: 8 (ref 5–15)
AST: 133 U/L — ABNORMAL HIGH (ref 15–41)
Alkaline Phosphatase: 278 U/L — ABNORMAL HIGH (ref 38–126)
BILIRUBIN TOTAL: 2.2 mg/dL — AB (ref 0.3–1.2)
BUN: 13 mg/dL (ref 8–23)
CO2: 27 mmol/L (ref 22–32)
Calcium: 8.4 mg/dL — ABNORMAL LOW (ref 8.9–10.3)
Chloride: 101 mmol/L (ref 98–111)
Creatinine, Ser: 1.26 mg/dL — ABNORMAL HIGH (ref 0.61–1.24)
GFR calc Af Amer: 57 mL/min — ABNORMAL LOW (ref >60)
GFR calc non Af Amer: 49 mL/min — ABNORMAL LOW (ref >60)
GLUCOSE: 126 mg/dL — AB (ref 70–99)
POTASSIUM: 3.5 mmol/L (ref 3.5–5.1)
Sodium: 136 mmol/L (ref 135–145)
TOTAL PROTEIN: 6.3 g/dL — AB (ref 6.5–8.1)

## 2018-09-05 MED ORDER — POLYETHYLENE GLYCOL 3350 17 G PO PACK
17.0000 g | PACK | Freq: Every day | ORAL | Status: DC | PRN
  Administered 2018-09-05 – 2018-09-20 (×12): 17 g via ORAL
  Filled 2018-09-05 (×12): qty 1

## 2018-09-05 MED ORDER — POTASSIUM CHLORIDE CRYS ER 20 MEQ PO TBCR
40.0000 meq | EXTENDED_RELEASE_TABLET | Freq: Once | ORAL | Status: AC
  Administered 2018-09-05: 40 meq via ORAL
  Filled 2018-09-05: qty 2

## 2018-09-05 MED ORDER — SODIUM CHLORIDE 0.9 % IV SOLN
2.0000 g | INTRAVENOUS | Status: DC
  Administered 2018-09-05 – 2018-09-07 (×3): 2 g via INTRAVENOUS
  Filled 2018-09-05 (×3): qty 20

## 2018-09-05 MED ORDER — SODIUM CHLORIDE 0.9 % IV BOLUS
250.0000 mL | Freq: Once | INTRAVENOUS | Status: AC
  Administered 2018-09-05: 250 mL via INTRAVENOUS

## 2018-09-05 NOTE — Progress Notes (Signed)
PROGRESS NOTE    William [name]  YPP:[id number] DOB: 1928-05-26 DOA: 09/04/2018 PCP: Haywood Pao, MD   Brief Narrative:  William [name] is a 84 y.o. male with medical history significant for Chronic Systolic CHF (EF 45-80%), ICM, Severe AS s/p TAVR 08/18/18, Hx of ventricular tachycardia s/p ICD, AAA, hyperlipidemia, prostate cancer with recurrent UTI, and degenerative arthritis who presents to the hospital with sudden onset of fevers, chills, and fatigue.  Patient was recently admitted from 08/07/2018-08/13/2018 for septic shock secondary to Klebsiella UTI.  He was admitted again from 08/18/2018-08/20/2018 for successful TAVR procedure.  Per family has been progressing well since his TAVR and was able to walk 50 feet this morning with the use of a walker.  Around 1 PM he had sudden onset of shaking chills, fatigue, and change in mental status.  Patient has a reported history of mild dementia but is able to answer some simple questions.  He does report subjective fevers and chills.  He had some nausea without emesis.  He denies any chest pain, shortness of breath, abdominal pain, diarrhea, constipation, dysuria.  ED Course:  Initial vitals showed BP 106/61, pulse 120, RR 26, temp 101.35F daily, SPO2 95% on room air.  Lab work was notable for WBC 6.4, hemoglobin 13.0, platelets 108k, AST 249, ALT 164, alkaline phosphatase 356, total bilirubin 3.7.  Lactic acid was 3.64 >> 2.74.  Urinalysis was negative.  Portable chest x-ray showed a right basilar opacity suggestive of atelectasis versus airspace disease/pneumonia.  CT abdomen/pelvis with contrast showed uncomplicated cholelithiasis, 4.4 cm infrarenal AAA, bilateral simple and complex renal cyst, and colonic diverticulosis without acute diverticulitis.  Blood cultures were drawn and patient was started on vancomycin, cefepime, metronidazole.  Cardiothoracic surgery  consulted and ordered a transthoracic echocardiogram which showed EF  35-40%, diffuse hypokinesis with akinesis of the inferior septal myocardium, TAVR bioprosthesis, and no evidence of vegetation.     Assessment & Plan:   Principal Problem:   Sepsis ([place]) Active Problems:   Nonischemic cardiomyopathy ([place])   Hyperlipidemia   S/P ICD (internal cardiac defibrillator) procedure, 01/11/14 removal of ERI gen and placement of Medtronic Evera XT VR & NEW Right ventricular lead Medtronic   Ventricular tachycardia (HCC)   S/P TAVR (transcatheter aortic valve replacement)  Sepsis, unknown source: Underwent TAVR on 08/18/18, repeat TTE without evidence of vegetation and ICD in place without obvious sign of infection.  CT abdomen/pelvis showed uncomplicated cholelithiasis.  He has elevated LFTs and total bilirubin and on my exam has right upper quadrant pain.  RUQ U/S showing cholelithiasis without sonographic findings of acute cholecystitis.  Urinalysis is negative for UTI. CXR with questionable right basilar atelectasis versus pneumonia. - Prelim blood cultures showing gram-negative rods, likely Klebsiella. - On Vancomycin/Cefepime/Metronidazole. -Concern for the heart while getting infected. Infectious disease consulted.  Severe aortic stenosis s/p TAVR 08/18/2018: TAVR bioprosthesis in place by TTE, no evidence of vegetation. -Continue aspirin and Plavix -Cardiothoracic surgery consulted  Hypotension: -Probably has chronically low blood pressure secondary to the heart failure but the systolic blood pressure usually in the 100s.  -Blood pressure at this time appears lower than his usual baseline.  Likely secondary to sepsis? -Reluctant to give a lot of fluids given his congestive heart failure. -We will order a small bolus of normal saline 250 cc. - Continue to monitor BP.  Chronic systolic CHF: EF 99-83% by TTE 09/04/2018.  Home metoprolol was held after TAVR procedure due to hypotension.  Does not appear to  have acute decomposition of his CHF. -Holding  Toprol-XL and Lasix with hypotension -Strict I/O's, daily weights  History of ventricular tachycardia s/p ICD: -Continue amiodarone  Hyperlipidemia: -Continue home ezetimibe-simvastatin  Infrarenal AAA: 4.4 cm infrarenal fusiform AAA seen on CT abdomen/pelvis.  Follow with cardiothoracic surgery.  DVT prophylaxis: Lovenox Code Status: Full Family Communication: Multiple family members at the bedside.  Plan of care discussed with the family. Disposition Plan: Home when stable   Consultants:   CT surgery  Infectious Disease  Procedures:   None  Antimicrobials:   Vancomycin, Cefepime, Flagyl    Subjective: BP low.  He denies having any major complaints at this time.  Objective: Vitals:   09/05/18 0508 09/05/18 0924 09/05/18 1019 09/05/18 1243  BP: 102/65  (!) 94/49 (!) 88/50  Pulse: 86  64 63  Resp: 20  18 17   Temp: 97.8 F (36.6 C) 98.3 F (36.8 C)  97.7 F (36.5 C)  TempSrc: Oral Oral  Oral  SpO2: 100%  100% 99%  Weight: 97.7 kg     Height:        Intake/Output Summary (Last 24 hours) at 09/05/2018 1454 Last data filed at 09/05/2018 1022 Gross per 24 hour  Intake -  Output 400 ml  Net -400 ml   Filed Weights   09/04/18 2309 09/05/18 0508  Weight: 97.7 kg 97.7 kg    Examination:  General exam: Appears calm and comfortable  Respiratory system: Decreased breath sounds lower lobes, clear to auscultation. Respiratory effort normal. Cardiovascular system: S1 & S2 heard, RRR. No JVD, murmurs, rubs, gallops or clicks. No pedal edema. Gastrointestinal system: Abdomen is nondistended, soft and nontender. No organomegaly or masses felt. Normal bowel sounds heard. Central nervous system: Alert and oriented x2.  Generalized weakness but no focal neurological deficits. Extremities: Symmetric 5 x 5 power. Skin: No rashes, lesions or ulcers Psychiatry: Judgement and insight appear normal. Mood & affect appropriate.     Data Reviewed: I have personally  reviewed following labs and imaging studies  CBC: Recent Labs  Lab 09/04/18 1425 09/05/18 0344  WBC 6.4 8.1  NEUTROABS 5.6  --   HGB 13.0 11.1*  HCT 36.9* 31.7*  MCV 95.3 96.6  PLT 108* 89*   Basic Metabolic Panel: Recent Labs  Lab 09/04/18 1425 09/05/18 0344  NA 134* 136  K 3.5 3.5  CL 99 101  CO2 22 27  GLUCOSE 255* 126*  BUN 15 13  CREATININE 1.16 1.26*  CALCIUM 8.6* 8.4*   GFR: Estimated Creatinine Clearance: 47.5 mL/min (A) (by C-G formula based on SCr of 1.26 mg/dL (H)). Liver Function Tests: Recent Labs  Lab 09/04/18 1425 09/05/18 0344  AST 249* 133*  ALT 164* 148*  ALKPHOS 356* 278*  BILITOT 3.7* 2.2*  PROT 7.0 6.3*  ALBUMIN 3.3* 2.8*   No results for input(s): LIPASE, AMYLASE in the last 168 hours. No results for input(s): AMMONIA in the last 168 hours. Coagulation Profile: No results for input(s): INR, PROTIME in the last 168 hours. Cardiac Enzymes: Recent Labs  Lab 09/04/18 2044  TROPONINI 0.03*   BNP (last 3 results) No results for input(s): PROBNP in the last 8760 hours. HbA1C: No results for input(s): HGBA1C in the last 72 hours. CBG: No results for input(s): GLUCAP in the last 168 hours. Lipid Profile: No results for input(s): CHOL, HDL, LDLCALC, TRIG, CHOLHDL, LDLDIRECT in the last 72 hours. Thyroid Function Tests: No results for input(s): TSH, T4TOTAL, FREET4, T3FREE, THYROIDAB in the last 72  hours. Anemia Panel: No results for input(s): VITAMINB12, FOLATE, FERRITIN, TIBC, IRON, RETICCTPCT in the last 72 hours. Sepsis Labs: Recent Labs  Lab 09/04/18 1449 09/04/18 1642  LATICACIDVEN 3.64* 2.74*    Recent Results (from the past 240 hour(s))  Blood Culture (routine x 2)     Status: None (Preliminary result)   Collection Time: 09/04/18  2:25 PM  Result Value Ref Range Status   Specimen Description BLOOD BLOOD RIGHT FOREARM  Final   Special Requests   Final    BOTTLES DRAWN AEROBIC AND ANAEROBIC Blood Culture adequate volume    Culture  Setup Time   Final    GRAM NEGATIVE RODS IN BOTH AEROBIC AND ANAEROBIC BOTTLES Organism ID to follow CRITICAL RESULT CALLED TO, READ BACK BY AND VERIFIED WITHDenton Brick PHARMD [id number] 09/05/18 A BROWNING    Culture   Final    NO GROWTH < 24 HOURS Performed at [facility], [place] [address], [place], [place] [address]    Report Status PENDING  Incomplete  Blood Culture ID Panel (Reflexed)     Status: Abnormal   Collection Time: 09/04/18  2:25 PM  Result Value Ref Range Status   Enterococcus species NOT DETECTED NOT DETECTED Final   Listeria monocytogenes NOT DETECTED NOT DETECTED Final   Staphylococcus species NOT DETECTED NOT DETECTED Final   Staphylococcus aureus (BCID) NOT DETECTED NOT DETECTED Final   Streptococcus species NOT DETECTED NOT DETECTED Final   Streptococcus agalactiae NOT DETECTED NOT DETECTED Final   Streptococcus pneumoniae NOT DETECTED NOT DETECTED Final   Streptococcus pyogenes NOT DETECTED NOT DETECTED Final   Acinetobacter baumannii NOT DETECTED NOT DETECTED Final   Enterobacteriaceae species DETECTED (A) NOT DETECTED Final    Comment: Enterobacteriaceae represent a large family of gram-negative bacteria, not a single organism. CRITICAL RESULT CALLED TO, READ BACK BY AND VERIFIED WITH: Denton Brick PHARMD [id number] 09/05/18 A BROWNING    Enterobacter cloacae complex NOT DETECTED NOT DETECTED Final   Escherichia coli NOT DETECTED NOT DETECTED Final   Klebsiella oxytoca NOT DETECTED NOT DETECTED Final   Klebsiella pneumoniae DETECTED (A) NOT DETECTED Final    Comment: CRITICAL RESULT CALLED TO, READ BACK BY AND VERIFIED WITH: Denton Brick PHARMD [id number] 09/05/18 A BROWNING    Proteus species NOT DETECTED NOT DETECTED Final   Serratia marcescens NOT DETECTED NOT DETECTED Final   Carbapenem resistance NOT DETECTED NOT DETECTED Final   Haemophilus influenzae NOT DETECTED NOT DETECTED Final   Neisseria meningitidis NOT DETECTED NOT DETECTED Final   Pseudomonas  aeruginosa NOT DETECTED NOT DETECTED Final   Candida albicans NOT DETECTED NOT DETECTED Final   Candida glabrata NOT DETECTED NOT DETECTED Final   Candida krusei NOT DETECTED NOT DETECTED Final   Candida parapsilosis NOT DETECTED NOT DETECTED Final   Candida tropicalis NOT DETECTED NOT DETECTED Final    Comment: Performed at [facility], [address], [place], [place] [address]  Blood Culture (routine x 2)     Status: None (Preliminary result)   Collection Time: 09/04/18  2:30 PM  Result Value Ref Range Status   Specimen Description BLOOD BLOOD LEFT WRIST  Final   Special Requests   Final    BOTTLES DRAWN AEROBIC AND ANAEROBIC Blood Culture results may not be optimal due to an inadequate volume of blood received in culture bottles   Culture  Setup Time   Final    GRAM NEGATIVE RODS AEROBIC BOTTLE ONLY CRITICAL RESULT CALLED TO, READ BACK  BY AND VERIFIED WITH: Denton Brick [facility] 4193 09/05/18 A BROWNING    Culture   Final    NO GROWTH < 24 HOURS Performed at [facility], [place] [address], [place], [place] [address]    Report Status PENDING  Incomplete         Radiology Studies: Ct Abdomen Pelvis W Contrast  Result Date: 09/04/2018 CLINICAL DATA:  Abdominal pain and fever EXAM: CT ABDOMEN AND PELVIS WITH CONTRAST TECHNIQUE: Multidetector CT imaging of the abdomen and pelvis was performed using the standard protocol following bolus administration of intravenous contrast. CONTRAST:  130mL OMNIPAQUE IOHEXOL 300 MG/ML  SOLN COMPARISON:  08/03/2018 FINDINGS: Lower chest: Cardiomegaly with evidence of prior TAVR. Coronary arteriosclerosis is noted. Bibasilar dependent atelectasis. Hepatobiliary: Numerous gallstones are identified within the gallbladder without secondary signs of cholecystitis. No mural thickening or pericholecystic fluid is noted. No space-occupying mass of the liver nor biliary dilatation is seen. Pancreas: Atrophic pancreas without inflammation or ductal  dilatation. Tiny subcentimeter cystic foci noted of the body of the pancreas, stable in appearance dating back to 2016 consistent benign findings. Spleen: Normal Adrenals/Urinary Tract: Normal bilateral adrenal glands. Bilateral renal cysts, the largest on the right is interpolar and posterior with punctate calcification associated with it. It measures up to 4.3 cm in diameter. The left kidney demonstrates a 4.5 cm in diameter upper pole renal cyst with smaller interpolar cysts noted. No hydroureteronephrosis. The urinary bladder is decompressed in appearance. Stomach/Bowel: The stomach is decompressed in appearance. The duodenal sweep and ligament of Treitz are normal. No small bowel obstruction, mass or inflammation. Normal appearing appendix. Nonobstructed large bowel without inflammatory change. Scattered colonic diverticulosis without acute diverticulitis is identified along the descending sigmoid colon. Vascular/Lymphatic: 4.4 cm infrarenal fusiform abdominal aortic aneurysm with moderate aortoiliac and branch vessel atherosclerosis. Findings are unchanged from recent comparison. No lymphadenopathy by CT size criteria. Reproductive: Normal size prostate and seminal vesicles. Nonspecific metallic density projects between the rectum posterior wall of bladder. Other: No free air or free fluid. 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 IMPRESSION: 1. 4.4 cm infrarenal fusiform abdominal aortic aneurysm. Recommend followup by ultrasound in 1 year. This recommendation follows ACR consensus guidelines: White Paper of the ACR Incidental Findings Committee II on Vascular Findings. J Am Coll Radiol 2013; 10:789-794. 2. Bilateral simple and complex renal cysts. 3. Uncomplicated cholelithiasis. 4. Colonic diverticulosis without acute diverticulitis. 5. 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 Electronically Signed   By: Ashley Royalty M.D.   On:  09/04/2018 17:56   Dg Chest Port 1 View  Result Date: 09/04/2018 CLINICAL DATA:  Fever and altered mental status. EXAM: PORTABLE CHEST 1 VIEW COMPARISON:  08/18/2018 FINDINGS: Cardiomegaly and AICD again noted. This is a low volume film. RIGHT basilar opacity identified-favor atelectasis over airspace disease/pneumonia. No pleural effusion or pneumothorax. No acute bony abnormality. IMPRESSION: RIGHT basilar opacity-favor atelectasis over airspace disease/pneumonia, but correlate clinically. Cardiomegaly. Electronically Signed   By: Margarette Canada M.D.   On: 09/04/2018 14:59   US Abdomen Limited Ruq  Result Date: 09/04/2018 CLINICAL DATA:  RIGHT upper quadrant pain. EXAM: ULTRASOUND ABDOMEN LIMITED RIGHT UPPER QUADRANT COMPARISON:  CT abdomen and pelvis September 04, 2018 FINDINGS: Gallbladder: Numerous echogenic stones within the gallbladder measuring less than 1 cm without gallbladder wall thickening or pericholecystic fluid. Wall echo shadow sign. No sonographic Murphy sign elicited. Common bile duct: Diameter: 8 mm, normal for age. Liver: No focal  lesion identified. Within normal limits in parenchymal echogenicity. Portal vein is patent on color Doppler imaging with normal direction of blood flow towards the liver. IMPRESSION: 1. Cholelithiasis without sonographic findings of acute cholecystitis. Electronically Signed   By: Elon Alas M.D.   On: 09/04/2018 21:25   Scheduled Meds: . amiodarone  100 mg Oral Daily  . aspirin  81 mg Oral Daily  . clopidogrel  75 mg Oral Q breakfast  . enoxaparin (LOVENOX) injection  40 mg Subcutaneous Daily  . ezetimibe-simvastatin  1 tablet Oral QHS  . sodium chloride flush  3 mL Intravenous Q12H   Continuous Infusions: . cefTRIAXone (ROCEPHIN)  IV 2 g (09/05/18 0907)  . metronidazole 500 mg (09/05/18 0521)     LOS: 1 day    Time spent: 35 min    Yaakov Guthrie MD [facility] Pager [phone]  If 7PM-7AM, please contact  night-coverage [email or web address] Password [facility] 09/05/2018, 2:54 PM

## 2018-09-05 NOTE — Progress Notes (Addendum)
      [address]       [place] [address]             [phone]        Subjective:  No new complaints.  Feeling a little better.  Daughter at bedside and is very concerned. Objective: Vital signs in last 24 hours: Temp:  [97.8 F (36.6 C)-101.4 F (38.6 C)] 98.3 F (36.8 C) (11/23 0924) Pulse Rate:  [64-124] 64 (11/23 1019) Cardiac Rhythm: Normal sinus rhythm (11/23 0700) Resp:  [15-26] 18 (11/23 1019) BP: (90-125)/(49-104) 94/49 (11/23 1019) SpO2:  [90 %-100 %] 100 % (11/23 1019) Weight:  [97.7 kg] 97.7 kg (11/23 0508)  Intake/Output from previous day: 11/22 0701 - 11/23 0700 In: -  Out: 200 [Urine:200] Intake/Output this shift: Total I/O In: -  Out: 200 [Urine:200]  General appearance: alert, cooperative and no distress Heart: regular rate and rhythm Lungs: clear to auscultation bilaterally Abdomen: soft, non-tender; bowel sounds normal; no masses,  no organomegaly  Lab Results: Recent Labs    09/04/18 1425 09/05/18 0344  WBC 6.4 8.1  HGB 13.0 11.1*  HCT 36.9* 31.7*  PLT 108* 89*   BMET:  Recent Labs    09/04/18 1425 09/05/18 0344  NA 134* 136  K 3.5 3.5  CL 99 101  CO2 22 27  GLUCOSE 255* 126*  BUN 15 13  CREATININE 1.16 1.26*  CALCIUM 8.6* 8.4*    PT/INR: No results for input(s): LABPROT, INR in the last 72 hours. ABG    Component Value Date/Time   PHART 7.503 (H) 08/13/2018 0435   HCO3 24.6 08/13/2018 0435   TCO2 28 08/18/2018 1039   O2SAT 93.8 08/13/2018 0435   CBG (last 3)  No results for input(s): GLUCAP in the last 72 hours.  Assessment/Plan:  1. CV- hemodynamically stable, Echocardiogram no vegetations on AVR 2. Pulm- no currently on oxygen, CXR with questionable pneumonia vs atelectasis, patient with thick yellow sputum while in room 3. ID- afebrile currently, no leukocytosis, UA negative, CT scan/US shows gallstones, no evidence of cholecystitis despite elevated LFTs, which are improving since  admission.... Blood cultures are positive for Klebsiella and Enterobacter on broad spectrum ABX 4. PT/OT consult will place placed per daughters request 5. Dispo- patient stable, no clear source of sepsis identified, care per medicine   LOS: 1 day    Ellwood Handler 09/05/2018    I have seen and examined the patient and agree with the assessment and plan as outlined.  No obvious vegetation seen on ECHO.  May need to consider TEE if source of bacteremia remains unclear and/or patient doesn't respond quickly to Rx.  Of note, prior to TAVR the patient was hospitalized 10/25-10/31 for Klebsiella UTI.  May need urology eval.  I would favor an aggressive approach to his medical therapy, as incomplete treatment could ultimately lead to PVE even if he doesn't already have it.  Await recommendations from Infectious Disease team - it is unclear to me whether or not they have been notified.  Discussed at length with patient's family at bedside.  All questions answered.  I spent in excess of 15 minutes during the conduct of this hospital encounter and >50% of this time involved direct face-to-face encounter with the patient for counseling and/or coordination of their care.    Rexene Alberts, MD 09/05/2018 11:54 AM

## 2018-09-05 NOTE — Progress Notes (Signed)
PHARMACY - PHYSICIAN COMMUNICATION CRITICAL VALUE ALERT - BLOOD CULTURE IDENTIFICATION (BCID)  William [name] is an 84 y.o. male who presented to [facility] on 09/04/2018 with a chief complaint of fever/chill/sepsis   Assessment:  2/2 blood cultures growing Klebsiella pneumoniae  Name of physician (or Provider) Contacted:   C Bodenheimer  Current antibiotics: Vancomycin and Cefepime   Changes to prescribed antibiotics recommended:   Change to Rocephin 2 g IV q24h  Results for orders placed or performed during the hospital encounter of 09/04/18  Blood Culture ID Panel (Reflexed) (Collected: 09/04/2018  2:25 PM)  Result Value Ref Range   Enterococcus species NOT DETECTED NOT DETECTED   Listeria monocytogenes NOT DETECTED NOT DETECTED   Staphylococcus species NOT DETECTED NOT DETECTED   Staphylococcus aureus (BCID) NOT DETECTED NOT DETECTED   Streptococcus species NOT DETECTED NOT DETECTED   Streptococcus agalactiae NOT DETECTED NOT DETECTED   Streptococcus pneumoniae NOT DETECTED NOT DETECTED   Streptococcus pyogenes NOT DETECTED NOT DETECTED   Acinetobacter baumannii NOT DETECTED NOT DETECTED   Enterobacteriaceae species DETECTED (A) NOT DETECTED   Enterobacter cloacae complex NOT DETECTED NOT DETECTED   Escherichia coli NOT DETECTED NOT DETECTED   Klebsiella oxytoca NOT DETECTED NOT DETECTED   Klebsiella pneumoniae DETECTED (A) NOT DETECTED   Proteus species NOT DETECTED NOT DETECTED   Serratia marcescens NOT DETECTED NOT DETECTED   Carbapenem resistance NOT DETECTED NOT DETECTED   Haemophilus influenzae NOT DETECTED NOT DETECTED   Neisseria meningitidis NOT DETECTED NOT DETECTED   Pseudomonas aeruginosa NOT DETECTED NOT DETECTED   Candida albicans NOT DETECTED NOT DETECTED   Candida glabrata NOT DETECTED NOT DETECTED   Candida krusei NOT DETECTED NOT DETECTED   Candida parapsilosis NOT DETECTED NOT DETECTED   Candida tropicalis NOT DETECTED NOT DETECTED    Caryl Pina 09/05/2018  6:11 AM

## 2018-09-05 NOTE — Consult Note (Signed)
[facility]    Date of Admission:  09/04/2018   Total days of antibiotics: 1 vanco/ceftriaxone/flagyl               Reason for Consult: Polymicrobial bacteremia    Referring Provider: Roxy Manns   Assessment: Polymicrobial bacteremia Recent tavr  Plan: 1. Will change to ceftriaxone alone 2. Repeat bcx in am 3. Would check tee  Comment- Concerned that his TAVR could be infected from prior UTI. His worsening LE edema recently also raises my concern for this. TEE would be helpful if he can tolerate.   Thank you so much for this interesting consult,  Principal Problem:   Sepsis [facility]) Active Problems:   Nonischemic cardiomyopathy ([place])   Hyperlipidemia   S/P ICD (internal cardiac defibrillator) procedure, 01/11/14 removal of ERI gen and placement of Medtronic Evera XT VR & NEW Right ventricular lead Medtronic   Ventricular tachycardia (HCC)   S/P TAVR (transcatheter aortic valve replacement)   . amiodarone  100 mg Oral Daily  . aspirin  81 mg Oral Daily  . clopidogrel  75 mg Oral Q breakfast  . enoxaparin (LOVENOX) injection  40 mg Subcutaneous Daily  . ezetimibe-simvastatin  1 tablet Oral QHS  . potassium chloride  40 mEq Oral Once  . sodium chloride flush  3 mL Intravenous Q12H    HPI: William [name] is a 84 y.o. male with hx of CHF (EF 35-40%), ICD, and TAVR 08-18-18.  He was amd 10-25 to 10-31 with Klebsiella UTI and sepsis.  He returned 11-5 and underwent TAVR.  On 11-22 he developed shaking chills and mental status change.  In ED he had temp to 101.4 and was tachycardic. Lactate 3.64. His WBC was normal as was his UA.  He was started on vanco/cefepime/flagyl.   He had CT abd/pelvis which showed diverticulosis without diverticulitis, and cholelithiasis, and compplex renal cysts.  4.4 cm infrarenal AAA.  He had TTE that showed no vegetations.   His BCx now show 2/2 GNR (BCID Enterobacter and klebsiella).   Review of  Systems: Review of Systems  Constitutional: Positive for chills. Negative for fever.  Respiratory: Negative for sputum production.   Cardiovascular: Negative for chest pain.  Gastrointestinal: Negative for constipation and diarrhea.  Genitourinary: Negative for dysuria.    Past Medical History:  Diagnosis Date  . AAA (abdominal aortic aneurysm) ([place])    7/13 3.8cm  . Arthritis    "joints" (01/11/2014)  . Asthma    "seasonal; some foods"   . Chronic systolic CHF (congestive heart failure) ([place])   . Dementia ([place])   . HLD (hyperlipidemia)   . HOH (hard of hearing)   . Lumbar vertebral fracture (HCC)    L1- 04/11/2014   . Prostate cancer ([place])   . S/P ICD (internal cardiac defibrillator) procedure, 01/11/14 removal of ERI gen and placement of Medtronic Evera XT VR & NEW Right ventricular lead Medtronic 01/12/2014  . S/P TAVR (transcatheter aortic valve replacement)    Edwards Sapien 3 THV (size 29 mm, model # [id number], serial # [id number])  . Severe aortic stenosis   . Sleep apnea    "lost 60# & don't have it anymore" (01/11/2014)  . Ventricular tachycardia (HCC)     Social History   Tobacco Use  . Smoking status: Never Smoker  . Smokeless tobacco: Never Used  Substance Use Topics  . Alcohol use: Yes    Alcohol/week: 0.0 standard drinks  Comment: 01/11/2014 "drink 1/2 of a beer twice per month   . Drug use: No    Family History  Problem Relation Age of Onset  . Heart failure Mother        Died of "old age" at 36  . Pneumonia Father      Medications:  Scheduled: . amiodarone  100 mg Oral Daily  . aspirin  81 mg Oral Daily  . clopidogrel  75 mg Oral Q breakfast  . enoxaparin (LOVENOX) injection  40 mg Subcutaneous Daily  . ezetimibe-simvastatin  1 tablet Oral QHS  . sodium chloride flush  3 mL Intravenous Q12H    Abtx:  Anti-infectives (From admission, onward)   Start     Dose/Rate Route Frequency Ordered Stop   09/05/18 1700  ceFEPIme (MAXIPIME) 2 g in sodium  chloride 0.9 % 100 mL IVPB  Status:  Discontinued     2 g 200 mL/hr over 30 Minutes Intravenous Every 24 hours 09/04/18 1537 09/05/18 0619   09/05/18 1700  vancomycin (VANCOCIN) 1,250 mg in sodium chloride 0.9 % 250 mL IVPB  Status:  Discontinued     1,250 mg 166.7 mL/hr over 90 Minutes Intravenous Every 24 hours 09/04/18 1537 09/05/18 0619   09/05/18 0800  cefTRIAXone (ROCEPHIN) 2 g in sodium chloride 0.9 % 100 mL IVPB     2 g 200 mL/hr over 30 Minutes Intravenous Every 24 hours 09/05/18 0619     09/04/18 1445  vancomycin (VANCOCIN) 2,000 mg in sodium chloride 0.9 % 500 mL IVPB     2,000 mg 250 mL/hr over 120 Minutes Intravenous  Once 09/04/18 1436 09/04/18 2148   09/04/18 1430  ceFEPIme (MAXIPIME) 2 g in sodium chloride 0.9 % 100 mL IVPB     2 g 200 mL/hr over 30 Minutes Intravenous  Once 09/04/18 1425 09/04/18 1657   09/04/18 1430  metroNIDAZOLE (FLAGYL) IVPB 500 mg     500 mg 100 mL/hr over 60 Minutes Intravenous Every 8 hours 09/04/18 1425     09/04/18 1430  vancomycin (VANCOCIN) IVPB 1000 mg/200 mL premix  Status:  Discontinued     1,000 mg 200 mL/hr over 60 Minutes Intravenous  Once 09/04/18 1425 09/04/18 1436        OBJECTIVE: Blood pressure (!) 88/50, pulse 63, temperature 97.7 F (36.5 C), temperature source Oral, resp. rate 17, height _0  (1.778 m), weight 97.7 kg, SpO2 99 %.  Physical Exam  Constitutional: He appears well-nourished. No distress.  HENT:  Mouth/Throat: No oropharyngeal exudate.  Eyes: Pupils are equal, round, and reactive to light. EOM are normal.  Neck: Normal range of motion. Neck supple.  Cardiovascular: Normal rate and regular rhythm.  icd site is non-tender  Pulmonary/Chest: Effort normal and breath sounds normal.  Abdominal: Soft. Bowel sounds are normal. He exhibits no distension. There is no tenderness.  Musculoskeletal: He exhibits edema.  Lymphadenopathy:    He has no cervical adenopathy.  Neurological: He is alert.    Lab  Results Results for orders placed or performed during the hospital encounter of 09/04/18 (from the past 48 hour(s))  Comprehensive metabolic panel     Status: Abnormal   Collection Time: 09/04/18  2:25 PM  Result Value Ref Range   Sodium 134 (L) 135 - 145 mmol/L   Potassium 3.5 3.5 - 5.1 mmol/L   Chloride 99 98 - 111 mmol/L   CO2 22 22 - 32 mmol/L   Glucose, Bld 255 (H) 70 - 99 mg/dL  BUN 15 8 - 23 mg/dL   Creatinine, Ser 1.16 0.61 - 1.24 mg/dL   Calcium 8.6 (L) 8.9 - 10.3 mg/dL   Total Protein 7.0 6.5 - 8.1 g/dL   Albumin 3.3 (L) 3.5 - 5.0 g/dL   AST 249 (H) 15 - 41 U/L   ALT 164 (H) 0 - 44 U/L   Alkaline Phosphatase 356 (H) 38 - 126 U/L   Total Bilirubin 3.7 (H) 0.3 - 1.2 mg/dL   GFR calc non Af Amer 54 (L) >60 mL/min   GFR calc Af Amer >60 >60 mL/min    Comment: (NOTE) The eGFR has been calculated using the CKD EPI equation. This calculation has not been validated in all clinical situations. eGFR's persistently <60 mL/min signify possible Chronic Kidney Disease.    Anion gap 13 5 - 15    Comment: Performed at [facility] [address], [place], [place] [address]  CBC WITH DIFFERENTIAL     Status: Abnormal   Collection Time: 09/04/18  2:25 PM  Result Value Ref Range   WBC 6.4 4.0 - 10.5 K/uL   RBC 3.87 (L) 4.22 - 5.81 MIL/uL   Hemoglobin 13.0 13.0 - 17.0 g/dL   HCT 36.9 (L) 39.0 - 52.0 %   MCV 95.3 80.0 - 100.0 fL   MCH 33.6 26.0 - 34.0 pg   MCHC 35.2 30.0 - 36.0 g/dL   RDW 15.9 (H) 11.5 - 15.5 %   Platelets 108 (L) 150 - 400 K/uL    Comment: REPEATED TO VERIFY Immature Platelet Fraction may be clinically indicated, consider ordering this additional test [id number] CONSISTENT WITH PREVIOUS RESULT    nRBC 0.0 0.0 - 0.2 %   Neutrophils Relative % 87 %   Neutro Abs 5.6 1.7 - 7.7 K/uL   Lymphocytes Relative 5 %   Lymphs Abs 0.3 (L) 0.7 - 4.0 K/uL   Monocytes Relative 5 %   Monocytes Absolute 0.3 0.1 - 1.0 K/uL   Eosinophils Relative 2 %   Eosinophils  Absolute 0.1 0.0 - 0.5 K/uL   Basophils Relative 0 %   Basophils Absolute 0.0 0.0 - 0.1 K/uL   Immature Granulocytes 1 %   Abs Immature Granulocytes 0.03 0.00 - 0.07 K/uL    Comment: Performed at [facility], [place] [address], [place], [place] [address]  Blood Culture (routine x 2)     Status: None (Preliminary result)   Collection Time: 09/04/18  2:25 PM  Result Value Ref Range   Specimen Description BLOOD BLOOD RIGHT FOREARM    Special Requests      BOTTLES DRAWN AEROBIC AND ANAEROBIC Blood Culture adequate volume   Culture  Setup Time      GRAM NEGATIVE RODS IN BOTH AEROBIC AND ANAEROBIC BOTTLES Organism ID to follow CRITICAL RESULT CALLED TO, READ BACK BY AND VERIFIED WITH: Denton Brick PHARMD [id number] 09/05/18 A BROWNING    Culture      NO GROWTH < 24 HOURS Performed at [facility], [place] [address], [place], [place] [address]    Report Status PENDING   Urinalysis, Routine w reflex microscopic     Status: None   Collection Time: 09/04/18  2:25 PM  Result Value Ref Range   Color, Urine YELLOW YELLOW   APPearance CLEAR CLEAR   Specific Gravity, Urine 1.010 1.005 - 1.030   pH 6.0 5.0 - 8.0   Glucose, UA NEGATIVE NEGATIVE mg/dL   Hgb urine dipstick NEGATIVE NEGATIVE  Bilirubin Urine NEGATIVE NEGATIVE   Ketones, ur NEGATIVE NEGATIVE mg/dL   Protein, ur NEGATIVE NEGATIVE mg/dL   Nitrite NEGATIVE NEGATIVE   Leukocytes, UA NEGATIVE NEGATIVE    Comment: Performed at [facility] [address], [place], [place] [address]  Blood Culture ID Panel (Reflexed)     Status: Abnormal   Collection Time: 09/04/18  2:25 PM  Result Value Ref Range   Enterococcus species NOT DETECTED NOT DETECTED   Listeria monocytogenes NOT DETECTED NOT DETECTED   Staphylococcus species NOT DETECTED NOT DETECTED   Staphylococcus aureus (BCID) NOT DETECTED NOT DETECTED   Streptococcus species NOT DETECTED NOT DETECTED   Streptococcus agalactiae NOT DETECTED NOT DETECTED   Streptococcus  pneumoniae NOT DETECTED NOT DETECTED   Streptococcus pyogenes NOT DETECTED NOT DETECTED   Acinetobacter baumannii NOT DETECTED NOT DETECTED   Enterobacteriaceae species DETECTED (A) NOT DETECTED    Comment: Enterobacteriaceae represent a large family of gram-negative bacteria, not a single organism. CRITICAL RESULT CALLED TO, READ BACK BY AND VERIFIED WITH: Denton Brick PHARMD [id number] 09/05/18 A BROWNING    Enterobacter cloacae complex NOT DETECTED NOT DETECTED   Escherichia coli NOT DETECTED NOT DETECTED   Klebsiella oxytoca NOT DETECTED NOT DETECTED   Klebsiella pneumoniae DETECTED (A) NOT DETECTED    Comment: CRITICAL RESULT CALLED TO, READ BACK BY AND VERIFIED WITH: Denton Brick PHARMD 3005 09/05/18 A BROWNING    Proteus species NOT DETECTED NOT DETECTED   Serratia marcescens NOT DETECTED NOT DETECTED   Carbapenem resistance NOT DETECTED NOT DETECTED   Haemophilus influenzae NOT DETECTED NOT DETECTED   Neisseria meningitidis NOT DETECTED NOT DETECTED   Pseudomonas aeruginosa NOT DETECTED NOT DETECTED   Candida albicans NOT DETECTED NOT DETECTED   Candida glabrata NOT DETECTED NOT DETECTED   Candida krusei NOT DETECTED NOT DETECTED   Candida parapsilosis NOT DETECTED NOT DETECTED   Candida tropicalis NOT DETECTED NOT DETECTED    Comment: Performed at [facility] [address], [place], [place] [address]  Blood Culture (routine x 2)     Status: None (Preliminary result)   Collection Time: 09/04/18  2:30 PM  Result Value Ref Range   Specimen Description BLOOD BLOOD LEFT WRIST    Special Requests      BOTTLES DRAWN AEROBIC AND ANAEROBIC Blood Culture results may not be optimal due to an inadequate volume of blood received in culture bottles   Culture  Setup Time      GRAM NEGATIVE RODS AEROBIC BOTTLE ONLY CRITICAL RESULT CALLED TO, READ BACK BY AND VERIFIED WITH: Denton Brick PHARMD [id number] 09/05/18 A BROWNING    Culture      NO GROWTH < 24 HOURS Performed at [facility],  [address], [place], [place] [address]    Report Status PENDING   I-Stat CG4 Lactic Acid, ED     Status: Abnormal   Collection Time: 09/04/18  2:49 PM  Result Value Ref Range   Lactic Acid, Venous 3.64 (HH) 0.5 - 1.9 mmol/L   Comment NOTIFIED PHYSICIAN   I-Stat CG4 Lactic Acid, ED     Status: Abnormal   Collection Time: 09/04/18  4:42 PM  Result Value Ref Range   Lactic Acid, Venous 2.74 (HH) 0.5 - 1.9 mmol/L   Comment NOTIFIED PHYSICIAN   Troponin I - Now Then Q6H     Status: Abnormal   Collection Time: 09/04/18  8:44 PM  Result Value Ref Range   Troponin I 0.03 (HH) <  0.03 ng/mL    Comment: CRITICAL RESULT CALLED TO, READ BACK BY AND VERIFIED WITH: PEREZ P,RN 09/04/18 2138 WAYK Performed at [facility], [place] [address], [place], [place] [address]   Influenza panel by PCR (type A & B)     Status: None   Collection Time: 09/05/18  2:28 AM  Result Value Ref Range   Influenza A By PCR NEGATIVE NEGATIVE   Influenza B By PCR NEGATIVE NEGATIVE    Comment: (NOTE) The Xpert Xpress Flu assay is intended as an aid in the diagnosis of  influenza and should not be used as a sole basis for treatment.  This  assay is FDA approved for nasopharyngeal swab specimens only. Nasal  washings and aspirates are unacceptable for Xpert Xpress Flu testing. Performed at [facility], [place] [address] [place], [place] [address]   Comprehensive metabolic panel     Status: Abnormal   Collection Time: 09/05/18  3:44 AM  Result Value Ref Range   Sodium 136 135 - 145 mmol/L   Potassium 3.5 3.5 - 5.1 mmol/L   Chloride 101 98 - 111 mmol/L   CO2 27 22 - 32 mmol/L   Glucose, Bld 126 (H) 70 - 99 mg/dL   BUN 13 8 - 23 mg/dL   Creatinine, Ser 1.26 (H) 0.61 - 1.24 mg/dL   Calcium 8.4 (L) 8.9 - 10.3 mg/dL   Total Protein 6.3 (L) 6.5 - 8.1 g/dL   Albumin 2.8 (L) 3.5 - 5.0 g/dL   AST 133 (H) 15 - 41 U/L   ALT 148 (H) 0 - 44 U/L   Alkaline Phosphatase 278 (H) 38 - 126 U/L   Total Bilirubin 2.2 (H) 0.3  - 1.2 mg/dL   GFR calc non Af Amer 49 (L) >60 mL/min   GFR calc Af Amer 57 (L) >60 mL/min    Comment: (NOTE) The eGFR has been calculated using the CKD EPI equation. This calculation has not been validated in all clinical situations. eGFR's persistently <60 mL/min signify possible Chronic Kidney Disease.    Anion gap 8 5 - 15    Comment: Performed at [facility] [address] [place], [place] [address]  CBC     Status: Abnormal   Collection Time: 09/05/18  3:44 AM  Result Value Ref Range   WBC 8.1 4.0 - 10.5 K/uL   RBC 3.28 (L) 4.22 - 5.81 MIL/uL   Hemoglobin 11.1 (L) 13.0 - 17.0 g/dL   HCT 31.7 (L) 39.0 - 52.0 %   MCV 96.6 80.0 - 100.0 fL   MCH 33.8 26.0 - 34.0 pg   MCHC 35.0 30.0 - 36.0 g/dL   RDW 15.8 (H) 11.5 - 15.5 %   Platelets 89 (L) 150 - 400 K/uL    Comment: Immature Platelet Fraction may be clinically indicated, consider ordering this additional test [id number] CONSISTENT WITH PREVIOUS RESULT    nRBC 0.0 0.0 - 0.2 %    Comment: Performed at [facility], [place] [address], [place], [place] [address]      Component Value Date/Time   SDES BLOOD BLOOD LEFT WRIST 09/04/2018 1430   SPECREQUEST  09/04/2018 1430    BOTTLES DRAWN AEROBIC AND ANAEROBIC Blood Culture results may not be optimal due to an inadequate volume of blood received in culture bottles   CULT  09/04/2018 1430    NO GROWTH < 24 HOURS Performed at [facility] [address], [place], [place]  [address]    REPTSTATUS PENDING 09/04/2018 1430   Ct Abdomen Pelvis W Contrast  Result Date: 09/04/2018 CLINICAL DATA:  Abdominal pain and fever EXAM: CT ABDOMEN AND PELVIS WITH CONTRAST TECHNIQUE: Multidetector CT imaging of the abdomen and pelvis was performed using the standard protocol following bolus administration of intravenous contrast. CONTRAST:  154m OMNIPAQUE IOHEXOL 300 MG/ML  SOLN COMPARISON:  08/03/2018 FINDINGS: Lower chest: Cardiomegaly with evidence of prior TAVR. Coronary  arteriosclerosis is noted. Bibasilar dependent atelectasis. Hepatobiliary: Numerous gallstones are identified within the gallbladder without secondary signs of cholecystitis. No mural thickening or pericholecystic fluid is noted. No space-occupying mass of the liver nor biliary dilatation is seen. Pancreas: Atrophic pancreas without inflammation or ductal dilatation. Tiny subcentimeter cystic foci noted of the body of the pancreas, stable in appearance dating back to 2016 consistent benign findings. Spleen: Normal Adrenals/Urinary Tract: Normal bilateral adrenal glands. Bilateral renal cysts, the largest on the right is interpolar and posterior with punctate calcification associated with it. It measures up to 4.3 cm in diameter. The left kidney demonstrates a 4.5 cm in diameter upper pole renal cyst with smaller interpolar cysts noted. No hydroureteronephrosis. The urinary bladder is decompressed in appearance. Stomach/Bowel: The stomach is decompressed in appearance. The duodenal sweep and ligament of Treitz are normal. No small bowel obstruction, mass or inflammation. Normal appearing appendix. Nonobstructed large bowel without inflammatory change. Scattered colonic diverticulosis without acute diverticulitis is identified along the descending sigmoid colon. Vascular/Lymphatic: 4.4 cm infrarenal fusiform abdominal aortic aneurysm with moderate aortoiliac and branch vessel atherosclerosis. Findings are unchanged from recent comparison. No lymphadenopathy by CT size criteria. Reproductive: Normal size prostate and seminal vesicles. Nonspecific metallic density projects between the rectum posterior wall of bladder. Other: No free air or free fluid. 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 IMPRESSION: 1. 4.4 cm infrarenal fusiform abdominal aortic aneurysm. Recommend followup by ultrasound in 1 year. This recommendation follows ACR consensus guidelines: White Paper of the  ACR Incidental Findings Committee II on Vascular Findings. J Am Coll Radiol 2013; 10:789-794. 2. Bilateral simple and complex renal cysts. 3. Uncomplicated cholelithiasis. 4. Colonic diverticulosis without acute diverticulitis. 5. 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 Electronically Signed   By: DAshley RoyaltyM.D.   On: 09/04/2018 17:56   Dg Chest Port 1 View  Result Date: 09/04/2018 CLINICAL DATA:  Fever and altered mental status. EXAM: PORTABLE CHEST 1 VIEW COMPARISON:  08/18/2018 FINDINGS: Cardiomegaly and AICD again noted. This is a low volume film. RIGHT basilar opacity identified-favor atelectasis over airspace disease/pneumonia. No pleural effusion or pneumothorax. No acute bony abnormality. IMPRESSION: RIGHT basilar opacity-favor atelectasis over airspace disease/pneumonia, but correlate clinically. Cardiomegaly. Electronically Signed   By: JMargarette CanadaM.D.   On: 09/04/2018 14:59   UKoreaAbdomen Limited Ruq  Result Date: 09/04/2018 CLINICAL DATA:  RIGHT upper quadrant pain. EXAM: ULTRASOUND ABDOMEN LIMITED RIGHT UPPER QUADRANT COMPARISON:  CT abdomen and pelvis September 04, 2018 FINDINGS: Gallbladder: Numerous echogenic stones within the gallbladder measuring less than 1 cm without gallbladder wall thickening or pericholecystic fluid. Wall echo shadow sign. No sonographic Murphy sign elicited. Common bile duct: Diameter: 8 mm, normal for age. Liver: No focal lesion identified. Within normal limits in parenchymal echogenicity. Portal vein is patent on color Doppler imaging with normal direction of blood flow towards the liver. IMPRESSION: 1. Cholelithiasis without sonographic findings of acute cholecystitis. Electronically Signed   By: CElon AlasM.D.   On: 09/04/2018  21:25   Recent Results (from the past 240 hour(s))  Blood Culture (routine x 2)     Status: None (Preliminary result)   Collection Time: 09/04/18  2:25 PM  Result Value Ref Range Status    Specimen Description BLOOD BLOOD RIGHT FOREARM  Final   Special Requests   Final    BOTTLES DRAWN AEROBIC AND ANAEROBIC Blood Culture adequate volume   Culture  Setup Time   Final    GRAM NEGATIVE RODS IN BOTH AEROBIC AND ANAEROBIC BOTTLES Organism ID to follow CRITICAL RESULT CALLED TO, READ BACK BY AND VERIFIED WITHDenton Brick PHARMD [id number] 09/05/18 A BROWNING    Culture   Final    NO GROWTH < 24 HOURS Performed at [facility], [place] [address] [place], [place] [address]    Report Status PENDING  Incomplete  Blood Culture ID Panel (Reflexed)     Status: Abnormal   Collection Time: 09/04/18  2:25 PM  Result Value Ref Range Status   Enterococcus species NOT DETECTED NOT DETECTED Final   Listeria monocytogenes NOT DETECTED NOT DETECTED Final   Staphylococcus species NOT DETECTED NOT DETECTED Final   Staphylococcus aureus (BCID) NOT DETECTED NOT DETECTED Final   Streptococcus species NOT DETECTED NOT DETECTED Final   Streptococcus agalactiae NOT DETECTED NOT DETECTED Final   Streptococcus pneumoniae NOT DETECTED NOT DETECTED Final   Streptococcus pyogenes NOT DETECTED NOT DETECTED Final   Acinetobacter baumannii NOT DETECTED NOT DETECTED Final   Enterobacteriaceae species DETECTED (A) NOT DETECTED Final    Comment: Enterobacteriaceae represent a large family of gram-negative bacteria, not a single organism. CRITICAL RESULT CALLED TO, READ BACK BY AND VERIFIED WITH: Denton Brick PHARMD 1962 09/05/18 A BROWNING    Enterobacter cloacae complex NOT DETECTED NOT DETECTED Final   Escherichia coli NOT DETECTED NOT DETECTED Final   Klebsiella oxytoca NOT DETECTED NOT DETECTED Final   Klebsiella pneumoniae DETECTED (A) NOT DETECTED Final    Comment: CRITICAL RESULT CALLED TO, READ BACK BY AND VERIFIED WITH: Denton Brick PHARMD [id number] 09/05/18 A BROWNING    Proteus species NOT DETECTED NOT DETECTED Final   Serratia marcescens NOT DETECTED NOT DETECTED Final   Carbapenem resistance NOT DETECTED  NOT DETECTED Final   Haemophilus influenzae NOT DETECTED NOT DETECTED Final   Neisseria meningitidis NOT DETECTED NOT DETECTED Final   Pseudomonas aeruginosa NOT DETECTED NOT DETECTED Final   Candida albicans NOT DETECTED NOT DETECTED Final   Candida glabrata NOT DETECTED NOT DETECTED Final   Candida krusei NOT DETECTED NOT DETECTED Final   Candida parapsilosis NOT DETECTED NOT DETECTED Final   Candida tropicalis NOT DETECTED NOT DETECTED Final    Comment: Performed at [facility], [address], [place], [place] [address]  Blood Culture (routine x 2)     Status: None (Preliminary result)   Collection Time: 09/04/18  2:30 PM  Result Value Ref Range Status   Specimen Description BLOOD BLOOD LEFT WRIST  Final   Special Requests   Final    BOTTLES DRAWN AEROBIC AND ANAEROBIC Blood Culture results may not be optimal due to an inadequate volume of blood received in culture bottles   Culture  Setup Time   Final    GRAM NEGATIVE RODS AEROBIC BOTTLE ONLY CRITICAL RESULT CALLED TO, READ BACK BY AND VERIFIED WITHDenton Brick PHARMD 1941 09/05/18 A BROWNING    Culture   Final    NO GROWTH < 24 HOURS Performed at [facility],  [address], [place], [place] [address]    Report Status PENDING  Incomplete    Microbiology: Recent Results (from the past 240 hour(s))  Blood Culture (routine x 2)     Status: None (Preliminary result)   Collection Time: 09/04/18  2:25 PM  Result Value Ref Range Status   Specimen Description BLOOD BLOOD RIGHT FOREARM  Final   Special Requests   Final    BOTTLES DRAWN AEROBIC AND ANAEROBIC Blood Culture adequate volume   Culture  Setup Time   Final    GRAM NEGATIVE RODS IN BOTH AEROBIC AND ANAEROBIC BOTTLES Organism ID to follow CRITICAL RESULT CALLED TO, READ BACK BY AND VERIFIED WITHDenton Brick PHARMD [id number] 09/05/18 A BROWNING    Culture   Final    NO GROWTH < 24 HOURS Performed at [facility], [place] [address] [place], [place] [address]     Report Status PENDING  Incomplete  Blood Culture ID Panel (Reflexed)     Status: Abnormal   Collection Time: 09/04/18  2:25 PM  Result Value Ref Range Status   Enterococcus species NOT DETECTED NOT DETECTED Final   Listeria monocytogenes NOT DETECTED NOT DETECTED Final   Staphylococcus species NOT DETECTED NOT DETECTED Final   Staphylococcus aureus (BCID) NOT DETECTED NOT DETECTED Final   Streptococcus species NOT DETECTED NOT DETECTED Final   Streptococcus agalactiae NOT DETECTED NOT DETECTED Final   Streptococcus pneumoniae NOT DETECTED NOT DETECTED Final   Streptococcus pyogenes NOT DETECTED NOT DETECTED Final   Acinetobacter baumannii NOT DETECTED NOT DETECTED Final   Enterobacteriaceae species DETECTED (A) NOT DETECTED Final    Comment: Enterobacteriaceae represent a large family of gram-negative bacteria, not a single organism. CRITICAL RESULT CALLED TO, READ BACK BY AND VERIFIED WITH: Denton Brick PHARMD [id number] 09/05/18 A BROWNING    Enterobacter cloacae complex NOT DETECTED NOT DETECTED Final   Escherichia coli NOT DETECTED NOT DETECTED Final   Klebsiella oxytoca NOT DETECTED NOT DETECTED Final   Klebsiella pneumoniae DETECTED (A) NOT DETECTED Final    Comment: CRITICAL RESULT CALLED TO, READ BACK BY AND VERIFIED WITH: Denton Brick PHARMD [id number] 09/05/18 A BROWNING    Proteus species NOT DETECTED NOT DETECTED Final   Serratia marcescens NOT DETECTED NOT DETECTED Final   Carbapenem resistance NOT DETECTED NOT DETECTED Final   Haemophilus influenzae NOT DETECTED NOT DETECTED Final   Neisseria meningitidis NOT DETECTED NOT DETECTED Final   Pseudomonas aeruginosa NOT DETECTED NOT DETECTED Final   Candida albicans NOT DETECTED NOT DETECTED Final   Candida glabrata NOT DETECTED NOT DETECTED Final   Candida krusei NOT DETECTED NOT DETECTED Final   Candida parapsilosis NOT DETECTED NOT DETECTED Final   Candida tropicalis NOT DETECTED NOT DETECTED Final    Comment: Performed at [facility], [address] [place],  [address]  Blood Culture (routine x 2)     Status: None (Preliminary result)   Collection Time: 09/04/18  2:30 PM  Result Value Ref Range Status   Specimen Description BLOOD BLOOD LEFT WRIST  Final   Special Requests   Final    BOTTLES DRAWN AEROBIC AND ANAEROBIC Blood Culture results may not be optimal due to an inadequate volume of blood received in culture bottles   Culture  Setup Time   Final    GRAM NEGATIVE RODS AEROBIC BOTTLE ONLY CRITICAL RESULT CALLED TO, READ BACK BY AND VERIFIED WITHDenton Brick PHARMD [id number] 09/05/18 A BROWNING    Culture  Final    NO GROWTH < 24 HOURS Performed at [facility], [place] [address] [place], [place] [address]    Report Status PENDING  Incomplete    Radiographs and labs were personally reviewed by me.   Bobby Rumpf, MD [facility] [phone] 09/05/2018, 3:14 PM

## 2018-09-06 DIAGNOSIS — I428 Other cardiomyopathies: Secondary | ICD-10-CM

## 2018-09-06 DIAGNOSIS — R7989 Other specified abnormal findings of blood chemistry: Secondary | ICD-10-CM

## 2018-09-06 LAB — COMPREHENSIVE METABOLIC PANEL
ALK PHOS: 219 U/L — AB (ref 38–126)
ALT: 100 U/L — AB (ref 0–44)
AST: 65 U/L — AB (ref 15–41)
Albumin: 2.6 g/dL — ABNORMAL LOW (ref 3.5–5.0)
Anion gap: 7 (ref 5–15)
BUN: 17 mg/dL (ref 8–23)
CALCIUM: 8.1 mg/dL — AB (ref 8.9–10.3)
CO2: 25 mmol/L (ref 22–32)
CREATININE: 1.13 mg/dL (ref 0.61–1.24)
Chloride: 104 mmol/L (ref 98–111)
GFR, EST NON AFRICAN AMERICAN: 56 mL/min — AB (ref >60)
Glucose, Bld: 124 mg/dL — ABNORMAL HIGH (ref 70–99)
Potassium: 3.1 mmol/L — ABNORMAL LOW (ref 3.5–5.1)
Sodium: 136 mmol/L (ref 135–145)
Total Bilirubin: 1.6 mg/dL — ABNORMAL HIGH (ref 0.3–1.2)
Total Protein: 6.1 g/dL — ABNORMAL LOW (ref 6.5–8.1)

## 2018-09-06 MED ORDER — POTASSIUM CHLORIDE CRYS ER 20 MEQ PO TBCR
40.0000 meq | EXTENDED_RELEASE_TABLET | Freq: Two times a day (BID) | ORAL | Status: AC
  Administered 2018-09-06 (×2): 40 meq via ORAL
  Filled 2018-09-06 (×2): qty 2

## 2018-09-06 NOTE — Progress Notes (Signed)
PROGRESS NOTE    William [name]  JTT:[id number] DOB: 28-Nov-1927 DOA: 09/04/2018 PCP: Haywood Pao, MD   Brief Narrative:  William [name] William [name] is a 84 y.o. male with medical history significant for Chronic Systolic CHF (EF 00-92%), ICM, Severe AS s/p TAVR 08/18/18, Hx of ventricular tachycardia s/p ICD, AAA, hyperlipidemia, prostate cancer with recurrent UTI, and degenerative arthritis who presented to the hospital with sudden onset of fevers, chills, and fatigue.  Patient was recently admitted from 08/07/2018-08/13/2018 for septic shock secondary to Klebsiella UTI.  He was admitted again from 08/18/2018-08/20/2018 for successful TAVR procedure.  Per family has been progressing well since his TAVR.  Around 1 PM he had sudden onset of shaking chills, fatigue, and change in mental status.  Patient has a reported history of mild dementia but is able to answer some simple questions.    ED Course:  Initial vitals showed BP 106/61, pulse 120, RR 26, temp 101.29F daily, SPO2 95% on room air.  Lab work was notable for WBC 6.4, hemoglobin 13.0, platelets 108k, AST 249, ALT 164, alkaline phosphatase 356, total bilirubin 3.7.  Lactic acid was 3.64 >> 2.74.  Urinalysis was negative.  Portable chest x-ray showed a right basilar opacity suggestive of atelectasis versus airspace disease/pneumonia.  CT abdomen/pelvis with contrast showed uncomplicated cholelithiasis, 4.4 cm infrarenal AAA, bilateral simple and complex renal cyst, and colonic diverticulosis without acute diverticulitis.  Blood cultures were drawn and patient was started on vancomycin, cefepime, metronidazole.  Cardiothoracic surgery  consulted and ordered a transthoracic echocardiogram which showed EF 35-40%, diffuse hypokinesis with akinesis of the inferior septal myocardium, TAVR bioprosthesis, and no evidence of vegetation.   Blood cultures showing Klebsiella.  Infectious disease following.   Assessment & Plan:   Principal  Problem:   Sepsis ([place]) Active Problems:   Nonischemic cardiomyopathy ([place])   Hyperlipidemia   S/P ICD (internal cardiac defibrillator) procedure, 01/11/14 removal of ERI gen and placement of Medtronic Evera XT VR & NEW Right ventricular lead Medtronic   Ventricular tachycardia (H[facility])   S/P TAVR (transcatheter aortic valve replacement)  Sepsis, unknown source: Underwent TAVR on 08/18/18, repeat TTE without evidence of vegetation and ICD in place without obvious sign of infection.  CT abdomen/pelvis showed uncomplicated cholelithiasis.  He has elevated LFTs and total bilirubin.  RUQ U/S showing cholelithiasis without sonographic findings of acute cholecystitis.  Urinalysis is negative for UTI. CXR with questionable right basilar atelectasis versus pneumonia. - Blood cultures showing Klebsiella. - Was on Vancomycin/Cefepime/Metronidazole. 09/06/18: -Infectious is consulted.  Patient currently switched to IV ceftriaxone 2 g daily. -Concern for the heart while getting infected.  -Plan for TEE per cardiology. -Repeat blood cultures ordered per infectious disease.  Severe aortic stenosis s/p TAVR 08/18/2018: TAVR bioprosthesis in place by TTE, no evidence of vegetation. -Continue aspirin and Plavix -Cardiothoracic surgery following.  Hypotension: -Probably has chronically low blood pressure secondary to the heart failure but the systolic blood pressure usually in the 100s.  -Blood pressure at this time appears lower than his usual baseline.  Likely secondary to sepsis? -Reluctant to give a lot of fluids given his congestive heart failure. -Given small bolus of normal saline 250 cc.  09/06/18: -Blood pressure improved compared to previous.   - Continue to monitor BP.  Chronic systolic CHF: EF 33-00% by TTE 09/04/2018.  Home metoprolol was held after TAVR procedure due to hypotension.  Does not appear to have acute decomposition of his CHF. -Holding Toprol-XL and Lasix with  hypotension -Strict I/O's,  daily weights  History of ventricular tachycardia s/p ICD: -Continue amiodarone  Hypokalmeia - Potassium replacement ordered. -Continue to monitor and replace as needed.  Hyperlipidemia: -Continue home ezetimibe-simvastatin  Infrarenal AAA: 4.4 cm infrarenal fusiform AAA seen on CT abdomen/pelvis.  Follow with cardiothoracic surgery.  DVT prophylaxis: Lovenox Code Status: Full Family Communication: Plan of care discussed with the family at bedside. Disposition Plan: Home when stable   Consultants:   CT surgery  Infectious Disease  Procedures:   None  Antimicrobials:   Ceftriaxone   Subjective: He denies having any major complaints at this time. Afebrile.  Objective: Vitals:   09/05/18 2200 09/06/18 0013 09/06/18 0500 09/06/18 0855  BP: (!) 108/58 98/67 (!) 101/58 111/66  Pulse:    94  Resp: (!) 21 17 18  (!) 21  Temp:  98.7 F (37.1 C) 97.8 F (36.6 C) 98 F (36.7 C)  TempSrc:  Oral Oral Oral  SpO2: 100% 99% 96% 97%  Weight:   100.4 kg   Height:        Intake/Output Summary (Last 24 hours) at 09/06/2018 1136 Last data filed at 09/05/2018 1849 Gross per 24 hour  Intake -  Output 150 ml  Net -150 ml   Filed Weights   09/04/18 2309 09/05/18 0508 09/06/18 0500  Weight: 97.7 kg 97.7 kg 100.4 kg    Examination:  General exam: Appears calm and comfortable  Respiratory system: Decreased breath sounds lower lobes, clear to auscultation. Respiratory effort normal. Cardiovascular system: S1 & S2 heard, RRR. No JVD, murmurs, rubs, gallops or clicks. No pedal edema. Gastrointestinal system: Abdomen is nondistended, soft and nontender. No organomegaly or masses felt. Normal bowel sounds heard. Central nervous system: Alert and oriented x2.  Generalized weakness but no focal neurological deficits. Extremities: Symmetric 5 x 5 power. Skin: No rashes, lesions or ulcers Psychiatry: Judgement and insight appear normal. Mood &  affect appropriate.     Data Reviewed: I have personally reviewed following labs and imaging studies  CBC: Recent Labs  Lab 09/04/18 1425 09/05/18 0344  WBC 6.4 8.1  NEUTROABS 5.6  --   HGB 13.0 11.1*  HCT 36.9* 31.7*  MCV 95.3 96.6  PLT 108* 89*   Basic Metabolic Panel: Recent Labs  Lab 09/04/18 1425 09/05/18 0344 09/06/18 0250  NA 134* 136 136  K 3.5 3.5 3.1*  CL 99 101 104  CO2 22 27 25   GLUCOSE 255* 126* 124*  BUN 15 13 17   CREATININE 1.16 1.26* 1.13  CALCIUM 8.6* 8.4* 8.1*   GFR: Estimated Creatinine Clearance: 53.7 mL/min (by C-G formula based on SCr of 1.13 mg/dL). Liver Function Tests: Recent Labs  Lab 09/04/18 1425 09/05/18 0344 09/06/18 0250  AST 249* 133* 65*  ALT 164* 148* 100*  ALKPHOS 356* 278* 219*  BILITOT 3.7* 2.2* 1.6*  PROT 7.0 6.3* 6.1*  ALBUMIN 3.3* 2.8* 2.6*   No results for input(s): LIPASE, AMYLASE in the last 168 hours. No results for input(s): AMMONIA in the last 168 hours. Coagulation Profile: No results for input(s): INR, PROTIME in the last 168 hours. Cardiac Enzymes: Recent Labs  Lab 09/04/18 2044  TROPONINI 0.03*   BNP (last 3 results) No results for input(s): PROBNP in the last 8760 hours. HbA1C: No results for input(s): HGBA1C in the last 72 hours. CBG: No results for input(s): GLUCAP in the last 168 hours. Lipid Profile: No results for input(s): CHOL, HDL, LDLCALC, TRIG, CHOLHDL, LDLDIRECT in the last 72 hours. Thyroid Function Tests: No results for  input(s): TSH, T4TOTAL, FREET4, T3FREE, THYROIDAB in the last 72 hours. Anemia Panel: No results for input(s): VITAMINB12, FOLATE, FERRITIN, TIBC, IRON, RETICCTPCT in the last 72 hours. Sepsis Labs: Recent Labs  Lab 09/04/18 1449 09/04/18 1642  LATICACIDVEN 3.64* 2.74*    Recent Results (from the past 240 hour(s))  Blood Culture (routine x 2)     Status: Abnormal (Preliminary result)   Collection Time: 09/04/18  2:25 PM  Result Value Ref Range Status    Specimen Description BLOOD BLOOD RIGHT FOREARM  Final   Special Requests   Final    BOTTLES DRAWN AEROBIC AND ANAEROBIC Blood Culture adequate volume   Culture  Setup Time   Final    GRAM NEGATIVE RODS IN BOTH AEROBIC AND ANAEROBIC BOTTLES CRITICAL RESULT CALLED TO, READ BACK BY AND VERIFIED WITHDenton Brick [facility] [id number] 09/05/18 A BROWNING Performed at [facility], [place] [address] [place], [place] [address]    Culture KLEBSIELLA PNEUMONIAE (A)  Final   Report Status PENDING  Incomplete  Blood Culture ID Panel (Reflexed)     Status: Abnormal   Collection Time: 09/04/18  2:25 PM  Result Value Ref Range Status   Enterococcus species NOT DETECTED NOT DETECTED Final   Listeria monocytogenes NOT DETECTED NOT DETECTED Final   Staphylococcus species NOT DETECTED NOT DETECTED Final   Staphylococcus aureus (BCID) NOT DETECTED NOT DETECTED Final   Streptococcus species NOT DETECTED NOT DETECTED Final   Streptococcus agalactiae NOT DETECTED NOT DETECTED Final   Streptococcus pneumoniae NOT DETECTED NOT DETECTED Final   Streptococcus pyogenes NOT DETECTED NOT DETECTED Final   Acinetobacter baumannii NOT DETECTED NOT DETECTED Final   Enterobacteriaceae species DETECTED (A) NOT DETECTED Final    Comment: Enterobacteriaceae represent a large family of gram-negative bacteria, not a single organism. CRITICAL RESULT CALLED TO, READ BACK BY AND VERIFIED WITH: Denton Brick PHARMD [id number] 09/05/18 A BROWNING    Enterobacter cloacae complex NOT DETECTED NOT DETECTED Final   Escherichia coli NOT DETECTED NOT DETECTED Final   Klebsiella oxytoca NOT DETECTED NOT DETECTED Final   Klebsiella pneumoniae DETECTED (A) NOT DETECTED Final    Comment: CRITICAL RESULT CALLED TO, READ BACK BY AND VERIFIED WITH: Denton Brick PHARMD [id number] 09/05/18 A BROWNING    Proteus species NOT DETECTED NOT DETECTED Final   Serratia marcescens NOT DETECTED NOT DETECTED Final   Carbapenem resistance NOT DETECTED NOT DETECTED Final    Haemophilus influenzae NOT DETECTED NOT DETECTED Final   Neisseria meningitidis NOT DETECTED NOT DETECTED Final   Pseudomonas aeruginosa NOT DETECTED NOT DETECTED Final   Candida albicans NOT DETECTED NOT DETECTED Final   Candida glabrata NOT DETECTED NOT DETECTED Final   Candida krusei NOT DETECTED NOT DETECTED Final   Candida parapsilosis NOT DETECTED NOT DETECTED Final   Candida tropicalis NOT DETECTED NOT DETECTED Final    Comment: Performed at [facility], [address] [place], [place] [address]  Blood Culture (routine x 2)     Status: None (Preliminary result)   Collection Time: 09/04/18  2:30 PM  Result Value Ref Range Status   Specimen Description BLOOD BLOOD LEFT WRIST  Final   Special Requests   Final    BOTTLES DRAWN AEROBIC AND ANAEROBIC Blood Culture results may not be optimal due to an inadequate volume of blood received in culture bottles   Culture  Setup Time   Final    GRAM NEGATIVE RODS AEROBIC BOTTLE ONLY CRITICAL RESULT CALLED TO, READ BACK BY  AND VERIFIED WITHDenton Brick [facility] [id number] 09/05/18 A BROWNING Performed at [facility], [place] [address], [place], [place] [address]    Culture GRAM NEGATIVE RODS  Final   Report Status PENDING  Incomplete         Radiology Studies: Ct Abdomen Pelvis W Contrast  Result Date: 09/04/2018 CLINICAL DATA:  Abdominal pain and fever EXAM: CT ABDOMEN AND PELVIS WITH CONTRAST TECHNIQUE: Multidetector CT imaging of the abdomen and pelvis was performed using the standard protocol following bolus administration of intravenous contrast. CONTRAST:  12mL OMNIPAQUE IOHEXOL 300 MG/ML  SOLN COMPARISON:  08/03/2018 FINDINGS: Lower chest: Cardiomegaly with evidence of prior TAVR. Coronary arteriosclerosis is noted. Bibasilar dependent atelectasis. Hepatobiliary: Numerous gallstones are identified within the gallbladder without secondary signs of cholecystitis. No mural thickening or pericholecystic fluid is noted. No  space-occupying mass of the liver nor biliary dilatation is seen. Pancreas: Atrophic pancreas without inflammation or ductal dilatation. Tiny subcentimeter cystic foci noted of the body of the pancreas, stable in appearance dating back to 2016 consistent benign findings. Spleen: Normal Adrenals/Urinary Tract: Normal bilateral adrenal glands. Bilateral renal cysts, the largest on the right is interpolar and posterior with punctate calcification associated with it. It measures up to 4.3 cm in diameter. The left kidney demonstrates a 4.5 cm in diameter upper pole renal cyst with smaller interpolar cysts noted. No hydroureteronephrosis. The urinary bladder is decompressed in appearance. Stomach/Bowel: The stomach is decompressed in appearance. The duodenal sweep and ligament of Treitz are normal. No small bowel obstruction, mass or inflammation. Normal appearing appendix. Nonobstructed large bowel without inflammatory change. Scattered colonic diverticulosis without acute diverticulitis is identified along the descending sigmoid colon. Vascular/Lymphatic: 4.4 cm infrarenal fusiform abdominal aortic aneurysm with moderate aortoiliac and branch vessel atherosclerosis. Findings are unchanged from recent comparison. No lymphadenopathy by CT size criteria. Reproductive: Normal size prostate and seminal vesicles. Nonspecific metallic density projects between the rectum posterior wall of bladder. Other: No free air or free fluid. 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 IMPRESSION: 1. 4.4 cm infrarenal fusiform abdominal aortic aneurysm. Recommend followup by ultrasound in 1 year. This recommendation follows ACR consensus guidelines: White Paper of the ACR Incidental Findings Committee II on Vascular Findings. J Am Coll Radiol 2013; 10:789-794. 2. Bilateral simple and complex renal cysts. 3. Uncomplicated cholelithiasis. 4. Colonic diverticulosis without acute diverticulitis. 5.  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 Electronically Signed   By: Ashley Royalty M.D.   On: 09/04/2018 17:56   Dg Chest Port 1 View  Result Date: 09/04/2018 CLINICAL DATA:  Fever and altered mental status. EXAM: PORTABLE CHEST 1 VIEW COMPARISON:  08/18/2018 FINDINGS: Cardiomegaly and AICD again noted. This is a low volume film. RIGHT basilar opacity identified-favor atelectasis over airspace disease/pneumonia. No pleural effusion or pneumothorax. No acute bony abnormality. IMPRESSION: RIGHT basilar opacity-favor atelectasis over airspace disease/pneumonia, but correlate clinically. Cardiomegaly. Electronically Signed   By: Margarette Canada M.D.   On: 09/04/2018 14:59   US Abdomen Limited Ruq  Result Date: 09/04/2018 CLINICAL DATA:  RIGHT upper quadrant pain. EXAM: ULTRASOUND ABDOMEN LIMITED RIGHT UPPER QUADRANT COMPARISON:  CT abdomen and pelvis September 04, 2018 FINDINGS: Gallbladder: Numerous echogenic stones within the gallbladder measuring less than 1 cm without gallbladder wall thickening or pericholecystic fluid. Wall echo shadow sign. No sonographic Murphy sign elicited. Common bile duct: Diameter: 8 mm, normal for age. Liver: No focal lesion identified. Within normal limits in parenchymal echogenicity.  Portal vein is patent on color Doppler imaging with normal direction of blood flow towards the liver. IMPRESSION: 1. Cholelithiasis without sonographic findings of acute cholecystitis. Electronically Signed   By: Elon Alas M.D.   On: 09/04/2018 21:25   Scheduled Meds: . amiodarone  100 mg Oral Daily  . aspirin  81 mg Oral Daily  . clopidogrel  75 mg Oral Q breakfast  . enoxaparin (LOVENOX) injection  40 mg Subcutaneous Daily  . ezetimibe-simvastatin  1 tablet Oral QHS  . sodium chloride flush  3 mL Intravenous Q12H   Continuous Infusions: . cefTRIAXone (ROCEPHIN)  IV 2 g (09/06/18 2277)     LOS: 2 days    Yaakov Guthrie MD [facility] Pager  [phone]  If 7PM-7AM, please contact night-coverage [email or web address] Password [facility] 09/06/2018, 11:36 AM

## 2018-09-06 NOTE — Care Management Note (Signed)
Case Management Note  Patient Details  Name: William [name] MRN: [id number] Date of Birth: [birth year]  Subjective/Objective:                    Action/Plan:  Spoke to patient and wife at bedside. They are active w [facility] in [facility], verified w Tommi Rumps. They would like to continue at [facility].    Expected Discharge Date:                  Expected Discharge Plan:     In-House Referral:     Discharge planning Services  CM Consult  Post Acute Care Choice:    Choice offered to:     DME Arranged:    DME Agency:     HH Arranged:    HH Agency:  [facility]  Status of Service:  In process, will continue to follow  If discussed at Long Length of Stay Meetings, dates discussed:    Additional Comments:  Carles Collet, RN 09/06/2018, 12:04 PM

## 2018-09-06 NOTE — Progress Notes (Addendum)
      [address]       [place] [address]             [phone]      Subjective:  Patient mildly confused this morning, family at bedside.  Very concerned when TEE will be done.    Objective: Vital signs in last 24 hours: Temp:  [97.2 F (36.2 C)-98.7 F (37.1 C)] 97.8 F (36.6 C) (11/24 0500) Pulse Rate:  [63-96] 96 (11/23 2015) Cardiac Rhythm: Normal sinus rhythm (11/24 0700) Resp:  [12-28] 18 (11/24 0500) BP: (88-110)/(49-72) 101/58 (11/24 0500) SpO2:  [91 %-100 %] 96 % (11/24 0500) Weight:  [100.4 kg] 100.4 kg (11/24 0500)  Intake/Output from previous day: 11/23 0701 - 11/24 0700 In: -  Out: 350 [Urine:350]  General appearance: cooperative and no distress Heart: regular rate and rhythm Lungs: clear to auscultation bilaterally Abdomen: soft, non-tender; bowel sounds normal; no masses,  no organomegaly Extremities: extremities normal, atraumatic, no cyanosis or edema  Lab Results: Recent Labs    09/04/18 1425 09/05/18 0344  WBC 6.4 8.1  HGB 13.0 11.1*  HCT 36.9* 31.7*  PLT 108* 89*   BMET:  Recent Labs    09/05/18 0344 09/06/18 0250  NA 136 136  K 3.5 3.1*  CL 101 104  CO2 27 25  GLUCOSE 126* 124*  BUN 13 17  CREATININE 1.26* 1.13  CALCIUM 8.4* 8.1*    PT/INR: No results for input(s): LABPROT, INR in the last 72 hours. ABG    Component Value Date/Time   PHART 7.503 (H) 08/13/2018 0435   HCO3 24.6 08/13/2018 0435   TCO2 28 08/18/2018 1039   O2SAT 93.8 08/13/2018 0435   CBG (last 3)  No results for input(s): GLUCAP in the last 72 hours.  Assessment/Plan:  1. CV- NSR, BP is labile at times- on Amiodarone, Vytorin 2. Pulm- no acute issues, CT of the chest has been ordered 3. ID- remains afebrile, on Rocephin for Klebsiella and Enterobacter + blood cultures, ID has been consulted and is recommended TEE for better look at TAVR valve as he very well could have PVE 4. Dispo- patient with mild confusion, remains afebrile, on  ABX per ID for + blood cultures, will need TEE likely tomorrow, CT chest ordered, care per medicine   LOS: 2 days    Ellwood Handler 09/06/2018  I have seen and examined the patient and agree with the assessment and plan as outlined.  Patient clinically looks a little better.  Await TEE results.  Unclear whether or not he needs referral to urology.  Rexene Alberts, MD 09/06/2018 10:25 AM

## 2018-09-06 NOTE — Progress Notes (Signed)
Patient ambulated with moderate, 2 person assist 100 feet and tolerated fair. He has leg weakness and does not always follow commands to stand up straight, face forward, or move legs.

## 2018-09-06 NOTE — Progress Notes (Signed)
INFECTIOUS DISEASE PROGRESS NOTE  ID: William [name] is a 84 y.o. male with  Principal Problem:   Sepsis ([place]) Active Problems:   Nonischemic cardiomyopathy ([place])   Hyperlipidemia   S/P ICD (internal cardiac defibrillator) procedure, 01/11/14 removal of ERI gen and placement of Medtronic Evera XT VR & NEW Right ventricular lead Medtronic   Ventricular tachycardia (H[facility])   S/P TAVR (transcatheter aortic valve replacement)  Subjective: No complaints, notes note confusion o/n/  Abtx:  Anti-infectives (From admission, onward)   Start     Dose/Rate Route Frequency Ordered Stop   09/05/18 1700  ceFEPIme (MAXIPIME) 2 g in sodium chloride 0.9 % 100 mL IVPB  Status:  Discontinued     2 g 200 mL/hr over 30 Minutes Intravenous Every 24 hours 09/04/18 1537 09/05/18 0619   09/05/18 1700  vancomycin (VANCOCIN) 1,250 mg in sodium chloride 0.9 % 250 mL IVPB  Status:  Discontinued     1,250 mg 166.7 mL/hr over 90 Minutes Intravenous Every 24 hours 09/04/18 1537 09/05/18 0619   09/05/18 0800  cefTRIAXone (ROCEPHIN) 2 g in sodium chloride 0.9 % 100 mL IVPB     2 g 200 mL/hr over 30 Minutes Intravenous Every 24 hours 09/05/18 0619     09/04/18 1445  vancomycin (VANCOCIN) 2,000 mg in sodium chloride 0.9 % 500 mL IVPB     2,000 mg 250 mL/hr over 120 Minutes Intravenous  Once 09/04/18 1436 09/04/18 2148   09/04/18 1430  ceFEPIme (MAXIPIME) 2 g in sodium chloride 0.9 % 100 mL IVPB     2 g 200 mL/hr over 30 Minutes Intravenous  Once 09/04/18 1425 09/04/18 1657   09/04/18 1430  metroNIDAZOLE (FLAGYL) IVPB 500 mg  Status:  Discontinued     500 mg 100 mL/hr over 60 Minutes Intravenous Every 8 hours 09/04/18 1425 09/05/18 1544   09/04/18 1430  vancomycin (VANCOCIN) IVPB 1000 mg/200 mL premix  Status:  Discontinued     1,000 mg 200 mL/hr over 60 Minutes Intravenous  Once 09/04/18 1425 09/04/18 1436      Medications:  Scheduled: . amiodarone  100 mg Oral Daily  . aspirin  81 mg Oral Daily  .  clopidogrel  75 mg Oral Q breakfast  . enoxaparin (LOVENOX) injection  40 mg Subcutaneous Daily  . ezetimibe-simvastatin  1 tablet Oral QHS  . sodium chloride flush  3 mL Intravenous Q12H    Objective: Vital signs in last 24 hours: Temp:  [97.2 F (36.2 C)-98.7 F (37.1 C)] 97.8 F (36.6 C) (11/24 0500) Pulse Rate:  [63-96] 96 (11/23 2015) Resp:  [12-28] 18 (11/24 0500) BP: (88-110)/(49-72) 101/58 (11/24 0500) SpO2:  [91 %-100 %] 96 % (11/24 0500) Weight:  [100.4 kg] 100.4 kg (11/24 0500)   General appearance: alert, cooperative and no distress Resp: clear to auscultation bilaterally Chest wall: no tenderness, ICD non-tender, non-fluctuant.  Cardio: regular rate and rhythm GI: normal findings: bowel sounds normal and soft, non-tender Extremities: 3+ BLE edema  Lab Results Recent Labs    09/04/18 1425 09/05/18 0344 09/06/18 0250  WBC 6.4 8.1  --   HGB 13.0 11.1*  --   HCT 36.9* 31.7*  --   NA 134* 136 136  K 3.5 3.5 3.1*  CL 99 101 104  CO2 22 27 25   BUN 15 13 17   CREATININE 1.16 1.26* 1.13   Liver Panel Recent Labs    09/05/18 0344 09/06/18 0250  PROT 6.3* 6.1*  ALBUMIN 2.8* 2.6*  AST 133* 65*  ALT 148* 100*  ALKPHOS 278* 219*  BILITOT 2.2* 1.6*   Sedimentation Rate No results for input(s): ESRSEDRATE in the last 72 hours. C-Reactive Protein No results for input(s): CRP in the last 72 hours.  Microbiology: Recent Results (from the past 240 hour(s))  Blood Culture (routine x 2)     Status: None (Preliminary result)   Collection Time: 09/04/18  2:25 PM  Result Value Ref Range Status   Specimen Description BLOOD BLOOD RIGHT FOREARM  Final   Special Requests   Final    BOTTLES DRAWN AEROBIC AND ANAEROBIC Blood Culture adequate volume   Culture  Setup Time   Final    GRAM NEGATIVE RODS IN BOTH AEROBIC AND ANAEROBIC BOTTLES Organism ID to follow CRITICAL RESULT CALLED TO, READ BACK BY AND VERIFIED WITHDenton Brick PHARMD [id number] 09/05/18 A BROWNING     Culture   Final    NO GROWTH < 24 HOURS Performed at [facility], [place] [address] [place], [place] [address]    Report Status PENDING  Incomplete  Blood Culture ID Panel (Reflexed)     Status: Abnormal   Collection Time: 09/04/18  2:25 PM  Result Value Ref Range Status   Enterococcus species NOT DETECTED NOT DETECTED Final   Listeria monocytogenes NOT DETECTED NOT DETECTED Final   Staphylococcus species NOT DETECTED NOT DETECTED Final   Staphylococcus aureus (BCID) NOT DETECTED NOT DETECTED Final   Streptococcus species NOT DETECTED NOT DETECTED Final   Streptococcus agalactiae NOT DETECTED NOT DETECTED Final   Streptococcus pneumoniae NOT DETECTED NOT DETECTED Final   Streptococcus pyogenes NOT DETECTED NOT DETECTED Final   Acinetobacter baumannii NOT DETECTED NOT DETECTED Final   Enterobacteriaceae species DETECTED (A) NOT DETECTED Final    Comment: Enterobacteriaceae represent a large family of gram-negative bacteria, not a single organism. CRITICAL RESULT CALLED TO, READ BACK BY AND VERIFIED WITH: Denton Brick PHARMD [id number] 09/05/18 A BROWNING    Enterobacter cloacae complex NOT DETECTED NOT DETECTED Final   Escherichia coli NOT DETECTED NOT DETECTED Final   Klebsiella oxytoca NOT DETECTED NOT DETECTED Final   Klebsiella pneumoniae DETECTED (A) NOT DETECTED Final    Comment: CRITICAL RESULT CALLED TO, READ BACK BY AND VERIFIED WITH: Denton Brick PHARMD [id number] 09/05/18 A BROWNING    Proteus species NOT DETECTED NOT DETECTED Final   Serratia marcescens NOT DETECTED NOT DETECTED Final   Carbapenem resistance NOT DETECTED NOT DETECTED Final   Haemophilus influenzae NOT DETECTED NOT DETECTED Final   Neisseria meningitidis NOT DETECTED NOT DETECTED Final   Pseudomonas aeruginosa NOT DETECTED NOT DETECTED Final   Candida albicans NOT DETECTED NOT DETECTED Final   Candida glabrata NOT DETECTED NOT DETECTED Final   Candida krusei NOT DETECTED NOT DETECTED Final   Candida parapsilosis NOT  DETECTED NOT DETECTED Final   Candida tropicalis NOT DETECTED NOT DETECTED Final    Comment: Performed at [facility], [address], [place], [place] [address]  Blood Culture (routine x 2)     Status: None (Preliminary result)   Collection Time: 09/04/18  2:30 PM  Result Value Ref Range Status   Specimen Description BLOOD BLOOD LEFT WRIST  Final   Special Requests   Final    BOTTLES DRAWN AEROBIC AND ANAEROBIC Blood Culture results may not be optimal due to an inadequate volume of blood received in culture bottles   Culture  Setup Time   Final    GRAM NEGATIVE RODS AEROBIC  BOTTLE ONLY CRITICAL RESULT CALLED TO, READ BACK BY AND VERIFIED WITH: Denton Brick PHARMD [id number] 09/05/18 A BROWNING    Culture   Final    NO GROWTH < 24 HOURS Performed at [facility], [address], [place], [place] [address]    Report Status PENDING  Incomplete    Studies/Results: Ct Abdomen Pelvis W Contrast  Result Date: 09/04/2018 CLINICAL DATA:  Abdominal pain and fever EXAM: CT ABDOMEN AND PELVIS WITH CONTRAST TECHNIQUE: Multidetector CT imaging of the abdomen and pelvis was performed using the standard protocol following bolus administration of intravenous contrast. CONTRAST:  182mL OMNIPAQUE IOHEXOL 300 MG/ML  SOLN COMPARISON:  08/03/2018 FINDINGS: Lower chest: Cardiomegaly with evidence of prior TAVR. Coronary arteriosclerosis is noted. Bibasilar dependent atelectasis. Hepatobiliary: Numerous gallstones are identified within the gallbladder without secondary signs of cholecystitis. No mural thickening or pericholecystic fluid is noted. No space-occupying mass of the liver nor biliary dilatation is seen. Pancreas: Atrophic pancreas without inflammation or ductal dilatation. Tiny subcentimeter cystic foci noted of the body of the pancreas, stable in appearance dating back to 2016 consistent benign findings. Spleen: Normal Adrenals/Urinary Tract: Normal bilateral adrenal glands. Bilateral renal cysts,  the largest on the right is interpolar and posterior with punctate calcification associated with it. It measures up to 4.3 cm in diameter. The left kidney demonstrates a 4.5 cm in diameter upper pole renal cyst with smaller interpolar cysts noted. No hydroureteronephrosis. The urinary bladder is decompressed in appearance. Stomach/Bowel: The stomach is decompressed in appearance. The duodenal sweep and ligament of Treitz are normal. No small bowel obstruction, mass or inflammation. Normal appearing appendix. Nonobstructed large bowel without inflammatory change. Scattered colonic diverticulosis without acute diverticulitis is identified along the descending sigmoid colon. Vascular/Lymphatic: 4.4 cm infrarenal fusiform abdominal aortic aneurysm with moderate aortoiliac and branch vessel atherosclerosis. Findings are unchanged from recent comparison. No lymphadenopathy by CT size criteria. Reproductive: Normal size prostate and seminal vesicles. Nonspecific metallic density projects between the rectum posterior wall of bladder. Other: No free air or free fluid. 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 IMPRESSION: 1. 4.4 cm infrarenal fusiform abdominal aortic aneurysm. Recommend followup by ultrasound in 1 year. This recommendation follows ACR consensus guidelines: White Paper of the ACR Incidental Findings Committee II on Vascular Findings. J Am Coll Radiol 2013; 10:789-794. 2. Bilateral simple and complex renal cysts. 3. Uncomplicated cholelithiasis. 4. Colonic diverticulosis without acute diverticulitis. 5. 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 Electronically Signed   By: Ashley Royalty M.D.   On: 09/04/2018 17:56   Dg Chest Port 1 View  Result Date: 09/04/2018 CLINICAL DATA:  Fever and altered mental status. EXAM: PORTABLE CHEST 1 VIEW COMPARISON:  08/18/2018 FINDINGS: Cardiomegaly and AICD again noted. This is a low volume film. RIGHT  basilar opacity identified-favor atelectasis over airspace disease/pneumonia. No pleural effusion or pneumothorax. No acute bony abnormality. IMPRESSION: RIGHT basilar opacity-favor atelectasis over airspace disease/pneumonia, but correlate clinically. Cardiomegaly. Electronically Signed   By: Margarette Canada M.D.   On: 09/04/2018 14:59   US Abdomen Limited Ruq  Result Date: 09/04/2018 CLINICAL DATA:  RIGHT upper quadrant pain. EXAM: ULTRASOUND ABDOMEN LIMITED RIGHT UPPER QUADRANT COMPARISON:  CT abdomen and pelvis September 04, 2018 FINDINGS: Gallbladder: Numerous echogenic stones within the gallbladder measuring less than 1 cm without gallbladder wall thickening or pericholecystic fluid. Wall echo shadow sign. No sonographic Murphy sign elicited. Common bile duct: Diameter: 8 mm, normal for age. Liver:  No focal lesion identified. Within normal limits in parenchymal echogenicity. Portal vein is patent on color Doppler imaging with normal direction of blood flow towards the liver. IMPRESSION: 1. Cholelithiasis without sonographic findings of acute cholecystitis. Electronically Signed   By: Elon Alas M.D.   On: 09/04/2018 21:25     Assessment/Plan: Polymicrobial bacteremia Recent tavr  Day 2 ceftriaxone Await ID of his GNR from his BCx (BCID shows 2 different GNR) Repeat BCx sent this AM Await TEE LFTs up since prior adm Explained to pt and family         Bobby Rumpf MD, FACP Infectious Diseases (pager) [phone] [email or web address] 09/06/2018, 9:01 AM  LOS: 2 days

## 2018-09-06 NOTE — Progress Notes (Signed)
   [facility] has been requested to perform a transesophageal echocardiogram on Doyne Keel Hinchey for bioprosthetic valve infective endocarditis.  After careful review of history and examination, the risks and benefits of transesophageal echocardiogram have been explained including risks of esophageal damage, perforation (1:10,000 risk), bleeding, pharyngeal hematoma as well as other potential complications associated with conscious sedation including aspiration, arrhythmia, respiratory failure and death. Alternatives to treatment were discussed, questions were answered. Patient is willing to proceed.   Pixie Casino, MD, [facility], [place] Director of the [facility]  [facility] Diplomate of the American Board of Clinical Lipidology Attending Cardiologist  Direct Dial: [phone]  Fax: [phone]  Website:  [email or web address] 09/06/2018 10:26 AM

## 2018-09-06 NOTE — Evaluation (Addendum)
Physical Therapy Evaluation Patient Details Name: William [name] MRN: [id number] DOB: September 01, 1928 Today's Date: 09/06/2018   History of Present Illness  Pt is a 84 y.o. M with significant PMH of chronic systolic EF, ICM, severe AS s/p TAVR 11/5, hx of ventricular tachycardia s/p ICD, AAA, hyperlipidemia, prostate CA with recurrent UTI, who presents with sudden onset of fever, chills, fatigue. Recent admits 10/25-10/31 for septic shock secondary to UTI. Admitted 11/5-11/7 for TAVR. Concern for TAVR infection from previous UTI.   Clinical Impression  Pt admitted with above diagnosis. Pt currently with functional limitations due to the deficits listed below (see PT Problem List). Prior to admission, patient requires assist for ADL's, has been walking 50 feet with walker unassisted, and has been receiving HH therapies. Pt has 24/7 supervision and pt wife is very supportive and able to physically assist if needed. Currently, patient requiring moderate assistance for sit to stand transfers and minimal assistance for pivotal steps from bed to chair using walker. HR 72-106 bpm, BP 111/66. Demonstrates generalized weakness, decreased activity tolerance, and poor balance. Feel he will do better returning to familiar home environment given baseline dementia. Pt will benefit from skilled PT to increase their independence and safety with mobility to allow discharge to the venue listed below.       Follow Up Recommendations Home health PT;Supervision/Assistance - 24 hour    Equipment Recommendations  None recommended by PT (has all needed DME)   Recommendations for Other Services   OT consult    Precautions / Restrictions Precautions Precautions: Fall Required Braces or Orthoses: Other Brace/Splint Other Brace/Splint: Right wrist splint from recent wrist dislocation per pt family Restrictions Weight Bearing Restrictions: No      Mobility  Bed Mobility Overal bed mobility: Needs Assistance Bed  Mobility: Supine to Sit     Supine to sit: Mod assist     General bed mobility comments: mod assist for trunk elevation  Transfers Overall transfer level: Needs assistance Equipment used: Rolling walker (2 wheeled) Transfers: Sit to/from [facility] Sit to Stand: Mod assist Stand pivot transfers: Min assist       General transfer comment: Pt needs max cues for sequencing. Patient with increased trunk/hip/knee flexion. Difficulty with initiation. Min assist provided for stand pivot from bed to chair  Ambulation/Gait                Stairs            Wheelchair Mobility    Modified Rankin (Stroke Patients Only)       Balance Overall balance assessment: Needs assistance Sitting-balance support: Feet supported Sitting balance-Leahy Scale: Poor Sitting balance - Comments: Intermittent posterior loss of balance   Standing balance support: Bilateral upper extremity supported;During functional activity Standing balance-Leahy Scale: Poor Standing balance comment: requires external support                             Pertinent Vitals/Pain Pain Assessment: No/denies pain    Home Living Family/patient expects to be discharged to:: Private residence Living Arrangements: Spouse/significant other Available Help at Discharge: Family;Friend(s);Available 24 hours/day Type of Home: House Home Access: Stairs to enter Entrance Stairs-Rails: None Entrance Stairs-Number of Steps: 1(threshold) Home Layout: Two level;Able to live on main level with bedroom/bathroom Home Equipment: Gilford Rile - 2 wheels;Wheelchair - manual;Hand held shower head;Shower seat;Grab bars - toilet;Grab bars - tub/shower;Walker - 4 wheels;Bedside commode;Shower seat - built in Additional Comments: pt's family assisting  with providing PLOF    Prior Function Level of Independence: Needs assistance   Gait / Transfers Assistance Needed: supervision with use of RW for limited  household ambulation  ADL's / Homemaking Assistance Needed: assist for ADL's  Comments: uses urinal for voiding bladder at night.     Hand Dominance   Dominant Hand: Right    Extremity/Trunk Assessment   Upper Extremity Assessment Upper Extremity Assessment: RUE deficits/detail RUE Deficits / Details: Decreased grip strength and unable to actively flex/extend wrist in gravity eliminated position due to pain. Wrist splint donned. Pt family reporting history of recent dislocation which has been followed up by ortho. Pt allowed to weightbear within reasonable limits    Lower Extremity Assessment Lower Extremity Assessment: Generalized weakness    Cervical / Trunk Assessment Cervical / Trunk Assessment: Kyphotic  Communication   Communication: HOH  Cognition Arousal/Alertness: Awake/alert Behavior During Therapy: WFL for tasks assessed/performed Overall Cognitive Status: History of cognitive impairments - at baseline                                 General Comments: pt with history of dementia at baseline, pleasantly confused. increased time for following simple commands      General Comments      Exercises     Assessment/Plan    PT Assessment Patient needs continued PT services  PT Problem List Decreased strength;Decreased activity tolerance;Decreased balance;Decreased mobility;Decreased coordination       PT Treatment Interventions Gait training;DME instruction;Functional mobility training;Therapeutic activities;Balance training;Patient/family education    PT Goals (Current goals can be found in the Care Plan section)  Acute Rehab PT Goals Patient Stated Goal: none stated by patient; pt family wants him to continue walking PT Goal Formulation: With patient Time For Goal Achievement: 09/20/18 Potential to Achieve Goals: Good    Frequency Min 3X/week   Barriers to discharge        Co-evaluation               AM-PAC PT "6 Clicks"  Mobility  Outcome Measure Help needed turning from your back to your side while in a flat bed without using bedrails?: A Little Help needed moving from lying on your back to sitting on the side of a flat bed without using bedrails?: A Lot Help needed moving to and from a bed to a chair (including a wheelchair)?: A Little Help needed standing up from a chair using your arms (e.g., wheelchair or bedside chair)?: A Lot Help needed to walk in hospital room?: A Little Help needed climbing 3-5 steps with a railing? : A Lot 6 Click Score: 15    End of Session Equipment Utilized During Treatment: Gait belt Activity Tolerance: Patient limited by fatigue Patient left: in chair;with call bell/phone within reach;with chair alarm set;with family/visitor present Nurse Communication: Mobility status PT Visit Diagnosis: Unsteadiness on feet (R26.81);Other abnormalities of gait and mobility (R26.89);Muscle weakness (generalized) (M62.81)    Time: 6314-9702 PT Time Calculation (min) (ACUTE ONLY): 42 min   Charges:   PT Evaluation $PT Eval Moderate Complexity: 1 Mod PT Treatments $Therapeutic Activity: 23-37 mins        Ellamae Sia, PT, DPT Acute Rehabilitation Services Pager [phone] Office [phone]   Willy Eddy 09/06/2018, 9:13 AM

## 2018-09-07 ENCOUNTER — Encounter: Payer: Medicare Other | Admitting: Cardiovascular Disease

## 2018-09-07 DIAGNOSIS — I472 Ventricular tachycardia: Secondary | ICD-10-CM

## 2018-09-07 DIAGNOSIS — R7881 Bacteremia: Secondary | ICD-10-CM

## 2018-09-07 DIAGNOSIS — B961 Klebsiella pneumoniae [K. pneumoniae] as the cause of diseases classified elsewhere: Secondary | ICD-10-CM

## 2018-09-07 LAB — COMPREHENSIVE METABOLIC PANEL
ALBUMIN: 2.5 g/dL — AB (ref 3.5–5.0)
ALT: 66 U/L — ABNORMAL HIGH (ref 0–44)
ANION GAP: 6 (ref 5–15)
AST: 34 U/L (ref 15–41)
Alkaline Phosphatase: 203 U/L — ABNORMAL HIGH (ref 38–126)
BILIRUBIN TOTAL: 1.2 mg/dL (ref 0.3–1.2)
BUN: 13 mg/dL (ref 8–23)
CO2: 24 mmol/L (ref 22–32)
Calcium: 8 mg/dL — ABNORMAL LOW (ref 8.9–10.3)
Chloride: 104 mmol/L (ref 98–111)
Creatinine, Ser: 0.83 mg/dL (ref 0.61–1.24)
GFR calc Af Amer: >60 mL/min (ref >60)
Glucose, Bld: 126 mg/dL — ABNORMAL HIGH (ref 70–99)
POTASSIUM: 3.9 mmol/L (ref 3.5–5.1)
Sodium: 134 mmol/L — ABNORMAL LOW (ref 135–145)
TOTAL PROTEIN: 5.7 g/dL — AB (ref 6.5–8.1)

## 2018-09-07 LAB — CBC
HEMATOCRIT: 31.6 % — AB (ref 39.0–52.0)
Hemoglobin: 10.4 g/dL — ABNORMAL LOW (ref 13.0–17.0)
MCH: 32.2 pg (ref 26.0–34.0)
MCHC: 32.9 g/dL (ref 30.0–36.0)
MCV: 97.8 fL (ref 80.0–100.0)
Platelets: 90 10*3/uL — ABNORMAL LOW (ref 150–400)
RBC: 3.23 MIL/uL — ABNORMAL LOW (ref 4.22–5.81)
RDW: 15.7 % — AB (ref 11.5–15.5)
WBC: 4.4 10*3/uL (ref 4.0–10.5)
nRBC: 0 % (ref 0.0–0.2)

## 2018-09-07 LAB — CULTURE, BLOOD (ROUTINE X 2): SPECIAL REQUESTS: ADEQUATE

## 2018-09-07 MED ORDER — ACETAMINOPHEN 325 MG PO TABS
650.0000 mg | ORAL_TABLET | Freq: Four times a day (QID) | ORAL | Status: DC | PRN
  Administered 2018-09-16: 650 mg via ORAL
  Filled 2018-09-07: qty 2

## 2018-09-07 MED ORDER — SODIUM CHLORIDE 0.9 % IV SOLN
1.0000 g | Freq: Three times a day (TID) | INTRAVENOUS | Status: DC
  Administered 2018-09-07 – 2018-09-20 (×40): 1 g via INTRAVENOUS
  Filled 2018-09-07 (×43): qty 1

## 2018-09-07 MED ORDER — ENOXAPARIN SODIUM 40 MG/0.4ML ~~LOC~~ SOLN
40.0000 mg | SUBCUTANEOUS | Status: DC
  Administered 2018-09-08 – 2018-09-13 (×6): 40 mg via SUBCUTANEOUS
  Filled 2018-09-07 (×6): qty 0.4

## 2018-09-07 MED ORDER — SODIUM CHLORIDE 0.9 % IV SOLN
INTRAVENOUS | Status: DC
  Administered 2018-09-07: 15:00:00 via INTRAVENOUS

## 2018-09-07 NOTE — Progress Notes (Addendum)
TEAM 1 - Stepdown/ICU LAVALLE SKODA Mahnken  MHD:[id number] DOB: 02/11/1928 DOA: 09/04/2018 PCP: Haywood Pao, MD    Brief Narrative:  84 y.o.malewith a hx of Chronic Systolic CHF (EF 21-19%), Severe AS s/p TAVR 08/18/18, ventricular tachycardia s/p ICD,AAA,hyperlipidemia, prostate cancer, recurrent UTI, and degenerative arthritis who presented to the hospital with sudden onset of fevers, chills, and fatigue.  Patient was recently admitted from 08/07/2018-08/13/2018 for septic shock secondary to Klebsiella UTI. He was admitted again from 08/18/2018-08/20/2018 for successful TAVR procedure.  In the ED he was found to have a pulse of 120, RR 26, temp 101.24F. Urinalysis was negative. CXR showed a right basilar opacity suggestive of atelectasis versus airspace disease/pneumonia. CT abdomen/pelvis with contrast showed uncomplicated cholelithiasis, 4.4 cm infrarenal AAA, bilateral simple and complex renal cyst, and colonic diverticulosis without acute diverticulitis.  Subjective: The patient denies new complaints today.  He is somewhat hungry.  He denies chest pain nausea vomiting or abdominal pain.  He is mildly confused.  Assessment & Plan:  Klebsiella bacteremia  TAVR 08/18/18 w/ repeatTTEwithout evidence of vegetation - ICDwithout obvious sign of infection - CT abdomen/pelvis showed uncomplicated cholelithiasis - RUQ U/S noted cholelithiasis without sonographic findings of acute cholecystitis - UA negative for UTI - CXR withquestionable right basilar atelectasis versus pneumonia - ID following - awaiting TEE  Severe aortic stenosiss/pTAVR 08/18/2018 TAVR bioprosthesis in place by TTE w/ no evidence of vegetation - TCTS following   Hypotension systolic blood pressure usually in the 100s - stable at this time  Chronic systolic CHF EF 41-74% by TTE 09/04/2018 - does not appear to have acute decomposition of his CHF Filed Weights   09/05/18 0508 09/06/18 0500  09/07/18 0321  Weight: 97.7 kg 100.4 kg 97.9 kg     History of ventricular tachycardias/p ICD Continue amiodarone  Hyperlipidemia Continue ezetimibe-simvastatin  Infrarenal AAA 4.4 cminfrarenal fusiform AAA seen on CT abdomen/pelvis - followed by TCTS  DVT prophylaxis: lovenox  Code Status: FULL CODE Family Communication: spoke with wife at bedside  Disposition Plan: SDU  Consultants:  TCTS ID  Antimicrobials:  Ceftriaxone 11/23 > 11/25 Merem 11/25 >  Objective: Blood pressure (!) 102/58, pulse 96, temperature (!) 97.5 F (36.4 C), temperature source Oral, resp. rate (!) 21, height 5\' 10"  (1.778 m), weight 97.9 kg, SpO2 95 %.  Intake/Output Summary (Last 24 hours) at 09/07/2018 1422 Last data filed at 09/07/2018 1100 Gross per 24 hour  Intake 915.78 ml  Output 1450 ml  Net -534.22 ml   Filed Weights   09/05/18 0508 09/06/18 0500 09/07/18 0321  Weight: 97.7 kg 100.4 kg 97.9 kg    Examination: General: No acute respiratory distress Lungs: Clear to auscultation bilaterally without wheezes or crackles Cardiovascular: Regular rate and rhythm without murmur gallop or rub normal S1 and S2 Abdomen: Nontender, nondistended, soft, bowel sounds positive, no rebound, no ascites, no appreciable mass Extremities: trace edema bilateral lower extremities  CBC: Recent Labs  Lab 09/04/18 1425 09/05/18 0344 09/07/18 0303  WBC 6.4 8.1 4.4  NEUTROABS 5.6  --   --   HGB 13.0 11.1* 10.4*  HCT 36.9* 31.7* 31.6*  MCV 95.3 96.6 97.8  PLT 108* 89* 90*   Basic Metabolic Panel: Recent Labs  Lab 09/05/18 0344 09/06/18 0250 09/07/18 0303  NA 136 136 134*  K 3.5 3.1* 3.9  CL 101 104 104  CO2 27 25 24   GLUCOSE 126* 124* 126*  BUN 13 17 13   CREATININE 1.26* 1.13 0.83  CALCIUM 8.4* 8.1* 8.0*   GFR: Estimated Creatinine Clearance: 72.2 mL/min (by C-G formula based on SCr of 0.83 mg/dL).  Liver Function Tests: Recent Labs  Lab 09/04/18 1425 09/05/18 0344  09/06/18 0250 09/07/18 0303  AST 249* 133* 65* 34  ALT 164* 148* 100* 66*  ALKPHOS 356* 278* 219* 203*  BILITOT 3.7* 2.2* 1.6* 1.2  PROT 7.0 6.3* 6.1* 5.7*  ALBUMIN 3.3* 2.8* 2.6* 2.5*    Cardiac Enzymes: Recent Labs  Lab 09/04/18 2044  TROPONINI 0.03*    HbA1C: Hgb A1c MFr Bld  Date/Time Value Ref Range Status  08/13/2018 04:09 AM 5.2 4.8 - 5.6 % Final    Comment:    (NOTE) Pre diabetes:          5.7%-6.4% Diabetes:              >6.4% Glycemic control for   <7.0% adults with diabetes      Recent Results (from the past 240 hour(s))  Blood Culture (routine x 2)     Status: Abnormal   Collection Time: 09/04/18  2:25 PM  Result Value Ref Range Status   Specimen Description BLOOD BLOOD RIGHT FOREARM  Final   Special Requests   Final    BOTTLES DRAWN AEROBIC AND ANAEROBIC Blood Culture adequate volume   Culture  Setup Time   Final    GRAM NEGATIVE RODS IN BOTH AEROBIC AND ANAEROBIC BOTTLES CRITICAL RESULT CALLED TO, READ BACK BY AND VERIFIED WITHDenton Brick [facility] 0254 09/05/18 A BROWNING Performed at [facility], [place] [address], [place], [place] [address]    Culture (A)  Final    KLEBSIELLA PNEUMONIAE Confirmed Extended Spectrum Beta-Lactamase Producer (ESBL).  In bloodstream infections from ESBL organisms, carbapenems are preferred over piperacillin/tazobactam. They are shown to have a lower risk of mortality.    Report Status 09/07/2018 FINAL  Final   Organism ID, Bacteria KLEBSIELLA PNEUMONIAE  Final      Susceptibility   Klebsiella pneumoniae - MIC*    AMPICILLIN >=32 RESISTANT Resistant     CEFAZOLIN >=64 RESISTANT Resistant     CEFEPIME RESISTANT Resistant     CEFTAZIDIME 32 RESISTANT Resistant     CEFTRIAXONE >=64 RESISTANT Resistant     CIPROFLOXACIN >=4 RESISTANT Resistant     GENTAMICIN >=16 RESISTANT Resistant     IMIPENEM <=0.25 SENSITIVE Sensitive     TRIMETH/SULFA >=320 RESISTANT Resistant     AMPICILLIN/SULBACTAM >=32 RESISTANT Resistant      PIP/TAZO 32 INTERMEDIATE Intermediate     Extended ESBL POSITIVE Resistant     * KLEBSIELLA PNEUMONIAE  Blood Culture ID Panel (Reflexed)     Status: Abnormal   Collection Time: 09/04/18  2:25 PM  Result Value Ref Range Status   Enterococcus species NOT DETECTED NOT DETECTED Final   Listeria monocytogenes NOT DETECTED NOT DETECTED Final   Staphylococcus species NOT DETECTED NOT DETECTED Final   Staphylococcus aureus (BCID) NOT DETECTED NOT DETECTED Final   Streptococcus species NOT DETECTED NOT DETECTED Final   Streptococcus agalactiae NOT DETECTED NOT DETECTED Final   Streptococcus pneumoniae NOT DETECTED NOT DETECTED Final   Streptococcus pyogenes NOT DETECTED NOT DETECTED Final   Acinetobacter baumannii NOT DETECTED NOT DETECTED Final   Enterobacteriaceae species DETECTED (A) NOT DETECTED Final    Comment: Enterobacteriaceae represent a large family of gram-negative bacteria, not a single organism. CRITICAL RESULT CALLED TO, READ BACK BY AND VERIFIED WITH: Denton Brick PHARMD [id number] 09/05/18 A BROWNING    Enterobacter  cloacae complex NOT DETECTED NOT DETECTED Final   Escherichia coli NOT DETECTED NOT DETECTED Final   Klebsiella oxytoca NOT DETECTED NOT DETECTED Final   Klebsiella pneumoniae DETECTED (A) NOT DETECTED Final    Comment: CRITICAL RESULT CALLED TO, READ BACK BY AND VERIFIED WITH: Denton Brick PHARMD [id number] 09/05/18 A BROWNING    Proteus species NOT DETECTED NOT DETECTED Final   Serratia marcescens NOT DETECTED NOT DETECTED Final   Carbapenem resistance NOT DETECTED NOT DETECTED Final   Haemophilus influenzae NOT DETECTED NOT DETECTED Final   Neisseria meningitidis NOT DETECTED NOT DETECTED Final   Pseudomonas aeruginosa NOT DETECTED NOT DETECTED Final   Candida albicans NOT DETECTED NOT DETECTED Final   Candida glabrata NOT DETECTED NOT DETECTED Final   Candida krusei NOT DETECTED NOT DETECTED Final   Candida parapsilosis NOT DETECTED NOT DETECTED Final   Candida  tropicalis NOT DETECTED NOT DETECTED Final    Comment: Performed at [facility], [place] [address] [place], [place] [address]  Blood Culture (routine x 2)     Status: Abnormal (Preliminary result)   Collection Time: 09/04/18  2:30 PM  Result Value Ref Range Status   Specimen Description BLOOD BLOOD LEFT WRIST  Final   Special Requests   Final    BOTTLES DRAWN AEROBIC AND ANAEROBIC Blood Culture results may not be optimal due to an inadequate volume of blood received in culture bottles   Culture  Setup Time   Final    GRAM NEGATIVE RODS AEROBIC BOTTLE ONLY CRITICAL RESULT CALLED TO, READ BACK BY AND VERIFIED WITHDenton Brick PHARMD 7628 09/05/18 A BROWNING    Culture (A)  Final    KLEBSIELLA ORNITHINOLYTICA SUSCEPTIBILITIES TO FOLLOW Performed at [facility], [place] [address], [place], [place] [address]    Report Status PENDING  Incomplete  Culture, blood (Routine X 2) w Reflex to ID Panel     Status: None (Preliminary result)   Collection Time: 09/06/18  8:12 AM  Result Value Ref Range Status   Specimen Description BLOOD LEFT ANTECUBITAL  Final   Special Requests   Final    BOTTLES DRAWN AEROBIC ONLY Blood Culture adequate volume   Culture   Final    NO GROWTH 1 DAY Performed at [facility], [place] [address] [place], [place] [address]    Report Status PENDING  Incomplete     Scheduled Meds: . amiodarone  100 mg Oral Daily  . aspirin  81 mg Oral Daily  . clopidogrel  75 mg Oral Q breakfast  . enoxaparin (LOVENOX) injection  40 mg Subcutaneous Daily  . ezetimibe-simvastatin  1 tablet Oral QHS  . sodium chloride flush  3 mL Intravenous Q12H     LOS: 3 days   Cherene Altes, MD [facility] Office  [phone] Pager - Text Page per Amion  If 7PM-7AM, please contact night-coverage per Amion 09/07/2018, 2:22 PM

## 2018-09-07 NOTE — Consult Note (Signed)
   [facility] CM Inpatient Consult   14/23/9532  William [name] William [name] [birth year] [id number]    Patient screened for potential [facility] Care Management services due to multiple hospitalizations.   Spoke with Tommi Rumps with [facility] to confirm that [name] is active with [facility] program.   Therefore, will not attempt to engage for [place] while on the [place] with inpatient RNCM to discuss above notes.    Marthenia Rolling, MSN-Ed, RN,BSN [facility] Liaison [phone]

## 2018-09-07 NOTE — Progress Notes (Signed)
[address]       ,[place] [address]             [phone]         Subjective: Confused this morning. Family at bedside. He did have a bowel movement on Friday. Awaiting TEE this morning.   Objective: Vital signs in last 24 hours: Temp:  [97.5 F (36.4 C)-98.7 F (37.1 C)] 97.5 F (36.4 C) (11/25 0744) Pulse Rate:  [92-98] 96 (11/25 0744) Cardiac Rhythm: Heart block (11/25 0700) Resp:  [18-21] 21 (11/25 0744) BP: (93-111)/(58-77) 102/58 (11/25 0744) SpO2:  [95 %-98 %] 95 % (11/25 0744) Weight:  [97.9 kg] 97.9 kg (11/25 0321)     Intake/Output from previous day: 11/24 0701 - 11/25 0700 In: 240 [P.O.:240] Out: 1050 [Urine:1050] Intake/Output this shift: No intake/output data recorded.  General appearance: alert, cooperative and no distress Heart: regular rate and rhythm, S1, S2 normal, no murmur, click, rub or gallop Lungs: clear to auscultation bilaterally Abdomen: soft, non-tender; bowel sounds normal; no masses,  no organomegaly Extremities: extremities normal, atraumatic, no cyanosis or edema  Lab Results: Recent Labs    09/05/18 0344 09/07/18 0303  WBC 8.1 4.4  HGB 11.1* 10.4*  HCT 31.7* 31.6*  PLT 89* 90*   BMET:  Recent Labs    09/06/18 0250 09/07/18 0303  NA 136 134*  K 3.1* 3.9  CL 104 104  CO2 25 24  GLUCOSE 124* 126*  BUN 17 13  CREATININE 1.13 0.83  CALCIUM 8.1* 8.0*    PT/INR: No results for input(s): LABPROT, INR in the last 72 hours. ABG    Component Value Date/Time   PHART 7.503 (H) 08/13/2018 0435   HCO3 24.6 08/13/2018 0435   TCO2 28 08/18/2018 1039   O2SAT 93.8 08/13/2018 0435   CBG (last 3)  No results for input(s): GLUCAP in the last 72 hours.   CT of the Chest 10/21  IMPRESSION: 1. Vascular findings and measurements pertinent to potential TAVR procedure, as detailed above. 2. Severe thickening calcification of the aortic valve, compatible with reported clinical history of severe aortic  stenosis. 3. Multiple small pulmonary nodules noted throughout the lungs bilaterally, largest of which measures only 6 mm in the periphery of the left lower lobe (axial image 51 of series 15). Non-contrast chest CT at 6 months is recommended. If the nodules are stable at time of repeat CT, then future CT at 18-24 months (from today's scan) is considered optional for low-risk patients, but is recommended for high-risk patients. This recommendation follows the consensus statement: Guidelines for Management of Incidental Pulmonary Nodules Detected on CT Images: From the Fleischner Society 2017; Radiology 2017; 284:228-243. 4. Several small low-attenuation lesions are noted in the body of the pancreas, largest of which measures only 1.4 x 0.8 cm, relatively similar to remote prior study from 2016. These are statistically likely benign given the patient's age and their behavior. If further evaluation is desired, repeat pancreatic protocol CT could be considered in 2 years to confirm continued stability. This recommendation follows ACR consensus guidelines: Management of Incidental Pancreatic Cysts: A White Paper of the ACR Incidental Findings Committee. J Am Coll Radiol 1962;22:979-892. 5. Aortic atherosclerosis, in addition to left main and 3 vessel coronary artery disease. In addition, there is fusiform aneurysmal dilatation of the infrarenal abdominal aorta which measures up to 4.4 cm in diameter. Recommend followup by ultrasound in 1 year. This recommendation follows ACR consensus guidelines: White Paper  of the ACR Incidental Findings Committee II on Vascular Findings. J Am Coll Radiol 2013; 10:789-794. 6. Cholelithiasis without evidence of acute cholecystitis at this time. 7. Circumferential thickening of the urinary bladder wall. Although this could simply be related to under distention of the urinary bladder, even accounting for the decompression of the urinary bladder, the wall  appears diffusely thickened. Outpatient referral to Urology could be considered for further evaluation if clinically appropriate. 8. Colonic diverticulosis without evidence of acute diverticulitis at this time. 9. Additional incidental findings, as above.   Electronically Signed   By: Vinnie Langton M.D.   On: 08/03/2018 17:00  Assessment/Plan:  1. CV-NSR, low BP at times but right now MAP in the 70s. On Amio and Vytorin 2. Pulm-CT of the chest performed on 10/21-report listed above.  3. Renal-creatinine 0.83, electrolytes okay, making good urine 4. H and H 10.4/31.6 5. ID-Remains on Rocephen for Klebsiella and Enterobacter + BC, ID following.   Plan: Currently NPO for TEE today. Continue ABX. Patient is feeling better but mildly confused.    LOS: 3 days    [name] 09/07/2018

## 2018-09-07 NOTE — H&P (View-Only) (Signed)
    [facility] for Infectious Disease    Date of Admission:  09/04/2018   Total days of antibiotics 4        Day 1 meropenem          ID: William [name] is a 84 y.o. male with hx of ICD, plus TAVR admitted for ESBL kelbsiella bacteremia Principal Problem:   Sepsis ([place]) Active Problems:   Nonischemic cardiomyopathy ([place])   Hyperlipidemia   S/P ICD (internal cardiac defibrillator) procedure, 01/11/14 removal of ERI gen and placement of Medtronic Evera XT VR & NEW Right ventricular lead Medtronic   Ventricular tachycardia (HCC)   S/P TAVR (transcatheter aortic valve replacement)    Subjective: Afebrile, fatigue.   Leukocytosis improving. Blood cx showing 2 different isolates of kleb species -though 1 is ESBL  Medications:  . amiodarone  100 mg Oral Daily  . aspirin  81 mg Oral Daily  . clopidogrel  75 mg Oral Q breakfast  . enoxaparin (LOVENOX) injection  40 mg Subcutaneous Q24H  . ezetimibe-simvastatin  1 tablet Oral QHS  . sodium chloride flush  3 mL Intravenous Q12H    Objective: Vital signs in last 24 hours: Temp:  [97.5 F (36.4 C)-98.7 F (37.1 C)] 97.5 F (36.4 C) (11/25 0744) Pulse Rate:  [92-96] 96 (11/25 0744) Resp:  [18-21] 21 (11/25 0744) BP: (95-110)/(58-77) 102/58 (11/25 0744) SpO2:  [95 %-97 %] 95 % (11/25 0744) Weight:  [97.9 kg] 97.9 kg (11/25 0321) Physical Exam  Constitutional: He is oriented to person, place, and time. He appears fatigued, and stated age. and well-nourished. No distress.  HENT:  Mouth/Throat: Oropharynx is clear and moist. No oropharyngeal exudate.  Cardiovascular: Normal rate, regular rhythm and normal heart sounds. Exam reveals no gallop and no friction rub.  Pulmonary/Chest: Effort normal and breath sounds normal. No respiratory distress. He has no wheezes.  Abdominal: Soft. Bowel sounds are normal. He exhibits no distension. There is no tenderness.  Neurological: He is alert and oriented to person, place, and time.    Skin: Skin is warm and dry. No rash noted. No erythema. Scattered echymosis Psychiatric: He has a normal mood and affect. His behavior is normal.    Lab Results Recent Labs    09/05/18 0344 09/06/18 0250 09/07/18 0303  WBC 8.1  --  4.4  HGB 11.1*  --  10.4*  HCT 31.7*  --  31.6*  NA 136 136 134*  K 3.5 3.1* 3.9  CL 101 104 104  CO2 27 25 24   BUN 13 17 13   CREATININE 1.26* 1.13 0.83   Liver Panel Recent Labs    09/06/18 0250 09/07/18 0303  PROT 6.1* 5.7*  ALBUMIN 2.6* 2.5*  AST 65* 34  ALT 100* 66*  ALKPHOS 219* 203*  BILITOT 1.6* 1.2    Microbiology: 1 blood cx set in 2/2 - ESBL kleb 2nd blood cx 1 of 2 bottles - sensitivities pending KLEBSIELLA ORNITHINOLYTICA Studies/Results: No results found.   Assessment/Plan: esbl klebsiella bacteremia = difficult to tell source of his infection. He denied having urinary symptoms prior to admit. He is started on meropenem today to treat it but also has another klebsiella species in his cultures. Await sensitivities. Repeat blood cx are pending.  He is scheduled to have TEE tomorrow afternoon to help decide on treatment course.  [facility] Cell: [phone] Pager: [phone]  09/07/2018, 5:51 PM

## 2018-09-07 NOTE — Progress Notes (Signed)
09/07/2018 1630 Pt walked from bed to door and back with walker.  Tried to get pt to walk a little more but had to use the [facility] to have a BM.  After using [facility] pt asked to sit up in chair for supper.  Call bell in reach and family at bedside. [place]

## 2018-09-07 NOTE — Evaluation (Signed)
Occupational Therapy Evaluation Patient Details Name: William [name] MRN: [id number] DOB: 02-26-1928 Today's Date: 09/07/2018    History of Present Illness Pt is a 84 y.o. M with significant PMH of chronic systolic EF, ICM, severe AS s/p TAVR 11/5, hx of ventricular tachycardia s/p ICD, AAA, hyperlipidemia, prostate CA with recurrent UTI, who presents with sudden onset of fever, chills, fatigue. Recent admits 10/25-10/31 for septic shock secondary to UTI. Admitted 11/5-11/7 for TAVR. Concern for TAVR infection from previous UTI.    Clinical Impression   PT admitted with see above. Pt currently with functional limitiations due to the deficits listed below (see OT problem list). Pt requires mod (A) for basic sit<>stand transfer. Pt demonstrates impulsive transfer and needs max cues for safety. Pt at baseline requires wife (A) for adls per notes and confirmed this session.  Pt will benefit from skilled OT to increase their independence and safety with adls and balance to allow discharge [place]. Recommendation for Palliative consult.     Follow Up Recommendations  Home health OT    Equipment Recommendations  None recommended by OT(transport chair?)    Recommendations for Other Services Other (comment)(palliative consult - multiple admissions)     Precautions / Restrictions Precautions Precautions: Fall Required Braces or Orthoses: Other Brace/Splint Other Brace/Splint: Right wrist splint from recent wrist dislocation per pt family Restrictions Weight Bearing Restrictions: No      Mobility Bed Mobility Overal bed mobility: Needs Assistance Bed Mobility: Supine to Sit;Sit to Supine     Supine to sit: Mod assist Sit to supine: Mod assist   General bed mobility comments: pt requires hand over hand to help reaching with R UE toward L hip to help with initiation of trunk. Ot (A) with trunk to elevate to EOB. pt with decr (A) with this tactile hand over hand (A) to sequence. wife  initially very guarded and yells "dont hurt that arm!" Ot educated wife that OT is only help guide the patient is doing the motion and it is to help maximize his ability to transfer. pt states "oh that wasnt bad at all " in response to transfer.   Transfers Overall transfer level: Needs assistance Equipment used: Rolling walker (2 wheeled) Transfers: Sit to/from [facility] Sit to Stand: Mod assist         General transfer comment: pt needs cues. pt immediately standing and then descneding to bed with Ot cueing to remain static standing.     Balance Overall balance assessment: Needs assistance Sitting-balance support: Feet supported Sitting balance-Leahy Scale: Poor Sitting balance - Comments: Intermittent posterior loss of balance Postural control: Posterior lean Standing balance support: Bilateral upper extremity supported;During functional activity Standing balance-Leahy Scale: Poor Standing balance comment: requires external support                           ADL either performed or assessed with clinical judgement   ADL Overall ADL's : Needs assistance/impaired             Lower Body Bathing: Total assistance       Lower Body Dressing: Total assistance   Toilet Transfer: Maximal assistance             General ADL Comments: pt agreeable to bed level movement. pt very concerned with granddaughter present so wife asking her to step out of the room. pt completed bed mobility with incr time and effort . pt requires cues for sequence. wife verbalized  patietn is walkign with walker using R UE. question the need for platform to keep good wrist alignment  decr pain     Vision Baseline Vision/History: Wears glasses Wears Glasses: At all times       Perception     Praxis      Pertinent Vitals/Pain Pain Assessment: No/denies pain Faces Pain Scale: Hurts a little bit Pain Location: general Pain Descriptors / Indicators: Grimacing Pain  Intervention(s): Monitored during session;Repositioned     Hand Dominance Right   Extremity/Trunk Assessment Upper Extremity Assessment Upper Extremity Assessment: RUE deficits/detail RUE Deficits / Details: removed splint to check skin and skin is in tact at this time. noted patient has pink band under splint  RUE Coordination: decreased fine motor;decreased gross motor       Cervical / Trunk Assessment Cervical / Trunk Assessment: Kyphotic   Communication Communication Communication: HOH   Cognition Arousal/Alertness: Awake/alert Behavior During Therapy: WFL for tasks assessed/performed Overall Cognitive Status: History of cognitive impairments - at baseline                                 General Comments: pt with history of dementia at baseline, pleasantly confused. increased time for following simple commands   General Comments       Exercises     Shoulder Instructions      Home Living Family/patient expects to be discharged to:: Private residence Living Arrangements: Spouse/significant other Available Help at Discharge: Family;Friend(s);Available 24 hours/day Type of Home: House Home Access: Stairs to enter [employer] of Steps: 1(threshold) Entrance Stairs-Rails: None Home Layout: Two level;Able to live on main level with bedroom/bathroom     Bathroom Shower/Tub: Tub/shower unit;Walk-in shower(walk in tub)   Bathroom Toilet: Handicapped height     Home Equipment: Environmental consultant - 2 wheels;Wheelchair - manual;Hand held shower head;Shower seat;Grab bars - toilet;Grab bars - tub/shower;Walker - 4 wheels;Bedside commode;Shower seat - built in   Additional Comments: pt's family assisting with providing PLOF      Prior Functioning/Environment Level of Independence: Needs assistance  Gait / Transfers Assistance Needed: supervision with use of RW for limited household ambulation ADL's / Homemaking Assistance Needed: assist for ADL's    Comments: uses urinal for voiding bladder at night.        OT Problem List: Decreased strength;Decreased activity tolerance;Impaired balance (sitting and/or standing);Decreased coordination;Decreased cognition;Decreased safety awareness;Decreased knowledge of use of DME or AE;Decreased knowledge of precautions;Cardiopulmonary status limiting activity;Pain;Impaired UE functional use      OT Treatment/Interventions: Self-care/ADL training;Therapeutic exercise;Neuromuscular education;Energy conservation;DME and/or AE instruction;Manual therapy;Modalities;Splinting;Therapeutic activities;Cognitive remediation/compensation;Patient/family education;Balance training    OT Goals(Current goals can be found in the care plan section) Acute Rehab OT Goals Patient Stated Goal: none stated by patient; pt family wants him to continue walking OT Goal Formulation: With patient/family Time For Goal Achievement: 09/21/18 Potential to Achieve Goals: Good  OT Frequency: Min 3X/week   Barriers to D/C:            Co-evaluation              AM-PAC OT "6 Clicks" Daily Activity     Outcome Measure Help from another person eating meals?: A Little Help from another person taking care of personal grooming?: A Little Help from another person toileting, which includes using toliet, bedpan, or urinal?: A Lot Help from another person bathing (including washing, rinsing, drying)?: Total Help from another person to put on and taking  off regular upper body clothing?: A Lot Help from another person to put on and taking off regular lower body clothing?: Total 6 Click Score: 12   End of Session Equipment Utilized During Treatment: Gait belt;Rolling walker Nurse Communication: Mobility status;Precautions;Weight bearing status  Activity Tolerance: Patient tolerated treatment well Patient left: in bed;in CPM;with bed alarm set;with family/visitor present  OT Visit Diagnosis: Unsteadiness on feet (R26.81);Muscle  weakness (generalized) (M62.81)                Time: 9810-2548 OT Time Calculation (min): 21 min Charges:  OT General Charges $OT Visit: 1 Visit OT Evaluation $OT Eval Moderate Complexity: 1 Mod   Jeri Modena, OTR/L  Acute Rehabilitation Services Pager: [phone] Office: [phone] .   Jeri Modena 09/07/2018, 3:05 PM

## 2018-09-07 NOTE — Progress Notes (Signed)
    William [name] for Infectious Disease    Date of Admission:  09/04/2018   Total days of antibiotics 4        Day 1 meropenem          ID: William [name] is a 84 y.o. male with hx of ICD, plus TAVR admitted for ESBL kelbsiella bacteremia Principal Problem:   Sepsis ([facility]) Active Problems:   Nonischemic cardiomyopathy ([place])   Hyperlipidemia   S/P ICD (internal cardiac defibrillator) procedure, 01/11/14 removal of ERI gen and placement of Medtronic Evera XT VR & NEW Right ventricular lead Medtronic   Ventricular tachycardia (HCC)   S/P TAVR (transcatheter aortic valve replacement)    Subjective: Afebrile, fatigue.   Leukocytosis improving. Blood cx showing 2 different isolates of kleb species -though 1 is ESBL  Medications:  . amiodarone  100 mg Oral Daily  . aspirin  81 mg Oral Daily  . clopidogrel  75 mg Oral Q breakfast  . enoxaparin (LOVENOX) injection  40 mg Subcutaneous Q24H  . ezetimibe-simvastatin  1 tablet Oral QHS  . sodium chloride flush  3 mL Intravenous Q12H    Objective: Vital signs in last 24 hours: Temp:  [97.5 F (36.4 C)-98.7 F (37.1 C)] 97.5 F (36.4 C) (11/25 0744) Pulse Rate:  [92-96] 96 (11/25 0744) Resp:  [18-21] 21 (11/25 0744) BP: (95-110)/(58-77) 102/58 (11/25 0744) SpO2:  [95 %-97 %] 95 % (11/25 0744) Weight:  [97.9 kg] 97.9 kg (11/25 0321) Physical Exam  Constitutional: He is oriented to person, place, and time. He appears fatigued, and stated age. and well-nourished. No distress.  HENT:  Mouth/Throat: Oropharynx is clear and moist. No oropharyngeal exudate.  Cardiovascular: Normal rate, regular rhythm and normal heart sounds. Exam reveals no gallop and no friction rub.  Pulmonary/Chest: Effort normal and breath sounds normal. No respiratory distress. He has no wheezes.  Abdominal: Soft. Bowel sounds are normal. He exhibits no distension. There is no tenderness.  Neurological: He is alert and oriented to person, place, and time.    Skin: Skin is warm and dry. No rash noted. No erythema. Scattered echymosis Psychiatric: He has a normal mood and affect. His behavior is normal.    Lab Results Recent Labs    09/05/18 0344 09/06/18 0250 09/07/18 0303  WBC 8.1  --  4.4  HGB 11.1*  --  10.4*  HCT 31.7*  --  31.6*  NA 136 136 134*  K 3.5 3.1* 3.9  CL 101 104 104  CO2 27 25 24   BUN 13 17 13   CREATININE 1.26* 1.13 0.83   Liver Panel Recent Labs    09/06/18 0250 09/07/18 0303  PROT 6.1* 5.7*  ALBUMIN 2.6* 2.5*  AST 65* 34  ALT 100* 66*  ALKPHOS 219* 203*  BILITOT 1.6* 1.2    Microbiology: 1 blood cx set in 2/2 - ESBL kleb 2nd blood cx 1 of 2 bottles - sensitivities pending KLEBSIELLA ORNITHINOLYTICA Studies/Results: No results found.   Assessment/Plan: esbl klebsiella bacteremia = difficult to tell source of his infection. He denied having urinary symptoms prior to admit. He is started on meropenem today to treat it but also has another klebsiella species in his cultures. Await sensitivities. Repeat blood cx are pending.  He is scheduled to have TEE tomorrow afternoon to help decide on treatment course.  [facility] Cell: [phone] Pager: [phone]  09/07/2018, 5:51 PM

## 2018-09-07 NOTE — Progress Notes (Signed)
[facility] Coordinator will follow pt with ID team to support Home Infusion Pharmacy services for home IV ABX if needed at DC.   If patient discharges after hours, please call [phone].   Larry Sierras 09/07/2018, 2:59 PM

## 2018-09-07 NOTE — Progress Notes (Signed)
Pharmacy Antibiotic Note  William [name] is a 84 y.o. male admitted on 09/04/2018 with chills and fever.  Patient was started on broad spectrum antibiotics for sepsis of unknown origin, then narrowed to Rocephin for Kleb pneumo bacteremia.  Culture updated to reveal Kleb ornithinolytica and ESBL Kleb pneumo.  Pharmacy has been consulted to switch antibiotics to [place].  Of note, patient has a recent TAVR and ECHO is negative for vegetation; TEE pending.  SCr improved to 0.83, CrCL 72 ml/min, afebrile, WBC WNL, LA down to 2.74.   Plan: Merrem 1gm IV Q8H Monitor renal fxn, clinical progress, repeat blood cultures  F/U TEE, ACEi/ARB when possible   Height: 5\' 10"  (177.8 cm) Weight: 215 lb 13.3 oz (97.9 kg) IBW/kg (Calculated) : 73  Temp (24hrs), Avg:98.3 F (36.8 C), Min:97.5 F (36.4 C), Max:98.7 F (37.1 C)  Recent Labs  Lab 09/04/18 1425 09/04/18 1449 09/04/18 1642 09/05/18 0344 09/06/18 0250 09/07/18 0303  WBC 6.4  --   --  8.1  --  4.4  CREATININE 1.16  --   --  1.26* 1.13 0.83  LATICACIDVEN  --  3.64* 2.74*  --   --   --     Estimated Creatinine Clearance: 72.2 mL/min (by C-G formula based on SCr of 0.83 mg/dL).    Allergies  Allergen Reactions  . Crestor [Rosuvastatin] Other (See Comments)    Hurts muscles  . Lipitor [Atorvastatin] Other (See Comments)    Hurts stomach     Cefepime 11/22 x1 CTX 11/23 >> 11/25 Vanc 11/22 >> 11/23 Flagyl 11/22 >> 11/23 Merrem 11/25 >>  11/22 BCx - ESBL Kleb pneumo/ornithinolytica (BCID Kleb pneumo)  11/23 Flu A/B PCR - negative 11/24 BCx - NGTD   William [name], PharmD, BCPS, [place] 09/07/2018, 12:37 PM

## 2018-09-08 ENCOUNTER — Encounter (HOSPITAL_COMMUNITY)
Admission: EM | Disposition: A | Discharge status: Rehabilitation Facility | Payer: Self-pay | Source: Home / Self Care | Attending: Internal Medicine

## 2018-09-08 ENCOUNTER — Inpatient Hospital Stay (HOSPITAL_COMMUNITY): Payer: Medicare Other

## 2018-09-08 ENCOUNTER — Encounter (HOSPITAL_COMMUNITY): Payer: Self-pay

## 2018-09-08 DIAGNOSIS — I340 Nonrheumatic mitral (valve) insufficiency: Secondary | ICD-10-CM

## 2018-09-08 DIAGNOSIS — R7881 Bacteremia: Secondary | ICD-10-CM

## 2018-09-08 DIAGNOSIS — T827XXA Infection and inflammatory reaction due to other cardiac and vascular devices, implants and grafts, initial encounter: Principal | ICD-10-CM

## 2018-09-08 HISTORY — PX: TEE WITHOUT CARDIOVERSION: SHX5443

## 2018-09-08 LAB — CULTURE, BLOOD (ROUTINE X 2)

## 2018-09-08 LAB — CBC
HCT: 31.9 % — ABNORMAL LOW (ref 39.0–52.0)
HEMOGLOBIN: 11 g/dL — AB (ref 13.0–17.0)
MCH: 33.2 pg (ref 26.0–34.0)
MCHC: 34.5 g/dL (ref 30.0–36.0)
MCV: 96.4 fL (ref 80.0–100.0)
Platelets: 97 10*3/uL — ABNORMAL LOW (ref 150–400)
RBC: 3.31 MIL/uL — AB (ref 4.22–5.81)
RDW: 15.7 % — ABNORMAL HIGH (ref 11.5–15.5)
WBC: 3.8 10*3/uL — ABNORMAL LOW (ref 4.0–10.5)
nRBC: 0 % (ref 0.0–0.2)

## 2018-09-08 LAB — COMPREHENSIVE METABOLIC PANEL
ALBUMIN: 2.4 g/dL — AB (ref 3.5–5.0)
ALK PHOS: 175 U/L — AB (ref 38–126)
ALT: 47 U/L — ABNORMAL HIGH (ref 0–44)
ANION GAP: 7 (ref 5–15)
AST: 28 U/L (ref 15–41)
BUN: 12 mg/dL (ref 8–23)
CHLORIDE: 105 mmol/L (ref 98–111)
CO2: 23 mmol/L (ref 22–32)
Calcium: 8.1 mg/dL — ABNORMAL LOW (ref 8.9–10.3)
Creatinine, Ser: 0.95 mg/dL (ref 0.61–1.24)
GFR calc Af Amer: >60 mL/min (ref >60)
GFR calc non Af Amer: >60 mL/min (ref >60)
GLUCOSE: 129 mg/dL — AB (ref 70–99)
Potassium: 3.5 mmol/L (ref 3.5–5.1)
SODIUM: 135 mmol/L (ref 135–145)
Total Bilirubin: 1.2 mg/dL (ref 0.3–1.2)
Total Protein: 5.7 g/dL — ABNORMAL LOW (ref 6.5–8.1)

## 2018-09-08 LAB — MAGNESIUM: Magnesium: 2.3 mg/dL (ref 1.7–2.4)

## 2018-09-08 SURGERY — ECHOCARDIOGRAM, TRANSESOPHAGEAL
Anesthesia: Moderate Sedation

## 2018-09-08 MED ORDER — FENTANYL CITRATE (PF) 100 MCG/2ML IJ SOLN
INTRAMUSCULAR | Status: DC | PRN
  Administered 2018-09-08: 12.5 ug via INTRAVENOUS

## 2018-09-08 MED ORDER — FENTANYL CITRATE (PF) 100 MCG/2ML IJ SOLN
INTRAMUSCULAR | Status: AC
  Filled 2018-09-08: qty 2

## 2018-09-08 MED ORDER — BUTAMBEN-TETRACAINE-BENZOCAINE 2-2-14 % EX AERO
INHALATION_SPRAY | CUTANEOUS | Status: DC | PRN
  Administered 2018-09-08: 2 via TOPICAL

## 2018-09-08 MED ORDER — MIDAZOLAM HCL (PF) 10 MG/2ML IJ SOLN
INTRAMUSCULAR | Status: DC | PRN
  Administered 2018-09-08 (×2): 1 mg via INTRAVENOUS

## 2018-09-08 MED ORDER — MIDAZOLAM HCL (PF) 5 MG/ML IJ SOLN
INTRAMUSCULAR | Status: AC
  Filled 2018-09-08: qty 2

## 2018-09-08 NOTE — Progress Notes (Signed)
[address]       [place] [address]             [phone]        Procedure(s) (LRB): TRANSESOPHAGEAL ECHOCARDIOGRAM (TEE) (N/A) Subjective: conts to feel better, more alert and active  Objective: Vital signs in last 24 hours: Temp:  [97.8 F (36.6 C)-98.7 F (37.1 C)] 97.8 F (36.6 C) (11/26 0511) Pulse Rate:  [90-113] 90 (11/26 0511) Cardiac Rhythm: Normal sinus rhythm (11/26 0500) Resp:  [15-20] 15 (11/26 0511) BP: (109-115)/(56-86) 109/56 (11/26 0511) SpO2:  [97 %-98 %] 98 % (11/26 0511) Weight:  [98.3 kg] 98.3 kg (11/26 0511)  Hemodynamic parameters for last 24 hours:    Intake/Output from previous day: 11/25 0701 - 11/26 0700 In: 1478.4 [P.O.:300; I.V.:202.6; IV Piggyback:975.8] Out: 1925 [[id number]] Intake/Output this shift: No intake/output data recorded.  General appearance: alert, cooperative and no distress Heart: regular rate and rhythm and frequent extrasysoles Lungs: clear to auscultation bilaterally Abdomen: soft, non-tender Extremities: min edema Wound: n/a  Lab Results: Recent Labs    09/07/18 0303  WBC 4.4  HGB 10.4*  HCT 31.6*  PLT 90*   BMET:  Recent Labs    09/06/18 0250 09/07/18 0303  NA 136 134*  K 3.1* 3.9  CL 104 104  CO2 25 24  GLUCOSE 124* 126*  BUN 17 13  CREATININE 1.13 0.83  CALCIUM 8.1* 8.0*    PT/INR: No results for input(s): LABPROT, INR in the last 72 hours. ABG    Component Value Date/Time   PHART 7.503 (H) 08/13/2018 0435   HCO3 24.6 08/13/2018 0435   TCO2 28 08/18/2018 1039   O2SAT 93.8 08/13/2018 0435   CBG (last 3)  No results for input(s): GLUCAP in the last 72 hours.  Meds Scheduled Meds: . amiodarone  100 mg Oral Daily  . aspirin  81 mg Oral Daily  . clopidogrel  75 mg Oral Q breakfast  . enoxaparin (LOVENOX) injection  40 mg Subcutaneous Q24H  . ezetimibe-simvastatin  1 tablet Oral QHS  . sodium chloride flush  3 mL Intravenous Q12H   Continuous Infusions: .  sodium chloride 20 mL/hr at 09/08/18 0000  . meropenem (MERREM) IV 1 g (09/08/18 0509)   PRN Meds:.acetaminophen, polyethylene glycol  Xrays No results found.  Results for orders placed or performed during the hospital encounter of 09/04/18  Blood Culture (routine x 2)     Status: Abnormal   Collection Time: 09/04/18  2:25 PM  Result Value Ref Range Status   Specimen Description BLOOD BLOOD RIGHT FOREARM  Final   Special Requests   Final    BOTTLES DRAWN AEROBIC AND ANAEROBIC Blood Culture adequate volume   Culture  Setup Time   Final    GRAM NEGATIVE RODS IN BOTH AEROBIC AND ANAEROBIC BOTTLES CRITICAL RESULT CALLED TO, READ BACK BY AND VERIFIED WITHDenton Brick [facility] 3474 09/05/18 A BROWNING Performed at [facility], [place] [address], [place], [place] [address]    Culture (A)  Final    KLEBSIELLA PNEUMONIAE Confirmed Extended Spectrum Beta-Lactamase Producer (ESBL).  In bloodstream infections from ESBL organisms, carbapenems are preferred over piperacillin/tazobactam. They are shown to have a lower risk of mortality.    Report Status 09/07/2018 FINAL  Final   Organism ID, Bacteria KLEBSIELLA PNEUMONIAE  Final      Susceptibility   Klebsiella pneumoniae - MIC*    AMPICILLIN >=32 RESISTANT Resistant  CEFAZOLIN >=64 RESISTANT Resistant     CEFEPIME RESISTANT Resistant     CEFTAZIDIME 32 RESISTANT Resistant     CEFTRIAXONE >=64 RESISTANT Resistant     CIPROFLOXACIN >=4 RESISTANT Resistant     GENTAMICIN >=16 RESISTANT Resistant     IMIPENEM <=0.25 SENSITIVE Sensitive     TRIMETH/SULFA >=320 RESISTANT Resistant     AMPICILLIN/SULBACTAM >=32 RESISTANT Resistant     PIP/TAZO 32 INTERMEDIATE Intermediate     Extended ESBL POSITIVE Resistant     * KLEBSIELLA PNEUMONIAE  Blood Culture ID Panel (Reflexed)     Status: Abnormal   Collection Time: 09/04/18  2:25 PM  Result Value Ref Range Status   Enterococcus species NOT DETECTED NOT DETECTED Final   Listeria monocytogenes  NOT DETECTED NOT DETECTED Final   Staphylococcus species NOT DETECTED NOT DETECTED Final   Staphylococcus aureus (BCID) NOT DETECTED NOT DETECTED Final   Streptococcus species NOT DETECTED NOT DETECTED Final   Streptococcus agalactiae NOT DETECTED NOT DETECTED Final   Streptococcus pneumoniae NOT DETECTED NOT DETECTED Final   Streptococcus pyogenes NOT DETECTED NOT DETECTED Final   Acinetobacter baumannii NOT DETECTED NOT DETECTED Final   Enterobacteriaceae species DETECTED (A) NOT DETECTED Final    Comment: Enterobacteriaceae represent a large family of gram-negative bacteria, not a single organism. CRITICAL RESULT CALLED TO, READ BACK BY AND VERIFIED WITH: Denton Brick [facility] [id number] 09/05/18 A BROWNING    Enterobacter cloacae complex NOT DETECTED NOT DETECTED Final   Escherichia coli NOT DETECTED NOT DETECTED Final   Klebsiella oxytoca NOT DETECTED NOT DETECTED Final   Klebsiella pneumoniae DETECTED (A) NOT DETECTED Final    Comment: CRITICAL RESULT CALLED TO, READ BACK BY AND VERIFIED WITH: Denton Brick [facility] [id number] 09/05/18 A BROWNING    Proteus species NOT DETECTED NOT DETECTED Final   Serratia marcescens NOT DETECTED NOT DETECTED Final   Carbapenem resistance NOT DETECTED NOT DETECTED Final   Haemophilus influenzae NOT DETECTED NOT DETECTED Final   Neisseria meningitidis NOT DETECTED NOT DETECTED Final   Pseudomonas aeruginosa NOT DETECTED NOT DETECTED Final   Candida albicans NOT DETECTED NOT DETECTED Final   Candida glabrata NOT DETECTED NOT DETECTED Final   Candida krusei NOT DETECTED NOT DETECTED Final   Candida parapsilosis NOT DETECTED NOT DETECTED Final   Candida tropicalis NOT DETECTED NOT DETECTED Final    Comment: Performed at [facility], [address], [place], [place] [address]  Blood Culture (routine x 2)     Status: Abnormal   Collection Time: 09/04/18  2:30 PM  Result Value Ref Range Status   Specimen Description BLOOD BLOOD LEFT WRIST  Final   Special Requests    Final    BOTTLES DRAWN AEROBIC AND ANAEROBIC Blood Culture results may not be optimal due to an inadequate volume of blood received in culture bottles   Culture  Setup Time   Final    GRAM NEGATIVE RODS AEROBIC BOTTLE ONLY CRITICAL RESULT CALLED TO, READ BACK BY AND VERIFIED WITHDenton Brick [facility] 5176 09/05/18 A BROWNING Performed at [facility], [place] [address] [place],  [address]    Culture KLEBSIELLA ORNITHINOLYTICA (A)  Final   Report Status 09/08/2018 FINAL  Final   Organism ID, Bacteria KLEBSIELLA ORNITHINOLYTICA  Final      Susceptibility   Klebsiella ornithinolytica - MIC*    AMPICILLIN >=32 RESISTANT Resistant     CEFAZOLIN >=64 RESISTANT Resistant     CEFEPIME 4 SENSITIVE Sensitive  CEFTAZIDIME 16 INTERMEDIATE Intermediate     CEFTRIAXONE >=64 RESISTANT Resistant     CIPROFLOXACIN >=4 RESISTANT Resistant     GENTAMICIN >=16 RESISTANT Resistant     IMIPENEM <=0.25 SENSITIVE Sensitive     TRIMETH/SULFA >=320 RESISTANT Resistant     AMPICILLIN/SULBACTAM >=32 RESISTANT Resistant     PIP/TAZO 32 INTERMEDIATE Intermediate     * KLEBSIELLA ORNITHINOLYTICA  Culture, blood (Routine X 2) w Reflex to ID Panel     Status: None (Preliminary result)   Collection Time: 09/06/18  8:12 AM  Result Value Ref Range Status   Specimen Description BLOOD LEFT ANTECUBITAL  Final   Special Requests   Final    BOTTLES DRAWN AEROBIC ONLY Blood Culture adequate volume   Culture   Final    NO GROWTH 2 DAYS Performed at [facility], [place] [address], [place], [place] [address]    Report Status PENDING  Incomplete     Assessment/Plan: S/P Procedure(s) (LRB): TRANSESOPHAGEAL ECHOCARDIOGRAM (TEE) (N/A)   1 conts to clinically show improvement. No fevers, WBC down to 3.8. More alert 2 renal fxn is normal, LFT's improving 3 ID managing ABX 4 H/H stable 5 thrombocytopenia improving trend 6 BS adeq controlled 7 medical service managing primary issues 8 TEE today,  continuing to follow with you  LOS: 4 days    John Giovanni [facility] 09/08/2018 Pager [phone]

## 2018-09-08 NOTE — CV Procedure (Signed)
    Transesophageal Echocardiogram Note  ABDULMALIK DARCO [id number] [birth year]  Procedure: Transesophageal Echocardiogram Indications: Bacteremia  Procedure Details Consent: Obtained Time Out: Verified patient identification, verified procedure, site/side was marked, verified correct patient position, special equipment/implants available, Radiology Safety Procedures followed,  medications/allergies/relevent history reviewed, required imaging and test results available.  Performed  Medications:  During this procedure the patient is administered a total of Versed 2 mg and Fentanyl 12.5 mcg  to achieve and maintain moderate conscious sedation.  The patient's heart rate, blood pressure, and oxygen saturation are monitored continuously during the procedure. The period of conscious sedation is 30 minutes, of which I was present face-to-face 100% of this time.  Severe LV dysfunction; severe LAE; s/p AVR; trace AI; moderate (3+) MR; TV not well visualized; oscillating density on ICD lead concerning for vegetation.   Complications: No apparent complications Patient did tolerate procedure well.  Kirk Ruths, MD

## 2018-09-08 NOTE — Progress Notes (Signed)
ID PROGRESS NOTE  84yo M with recent TAVR (08/18/2018), ICD who was admitted for ESBL klebsiella bacteremia underwent TEE finding vegetation to lead.  - continue on meropenem, will need 6 wks. - will need to discuss with cardiology what options exist beyond abtx for management.   Elzie Rings [facility] [phone]

## 2018-09-08 NOTE — Progress Notes (Signed)
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  Patient was recently admitted from 08/07/2018-08/13/2018 for septic shock secondary to Klebsiella UTI. He was admitted again from 08/18/2018-08/20/2018 for successful TAVR procedure.  In the ED he was found to have a pulse of 120, RR 26, temp 101.42F. Urinalysis was negative. CXR showed a right basilar opacity suggestive of atelectasis versus airspace disease/pneumonia. CT abdomen/pelvis with contrast showed uncomplicated cholelithiasis, 4.4 cm infrarenal AAA, bilateral simple and complex renal cyst, and colonic diverticulosis without acute diverticulitis.  Subjective: Resting comfortably post TEE. No apparent acute complications. Spoke at length w/ his daughter and wife at bedside.   Assessment & Plan:  ESBL Klebsiella bacteremia  TEE w/o signs of TAVR endocarditis, but revealed possible ICD lead vegetation - CT abdomen/pelvis showed uncomplicated cholelithiasis - RUQ U/S noted cholelithiasis without sonographic findings of acute cholecystitis - UA negative for UTI - CXR withquestionable right basilar atelectasis versus pneumonia - ID following - EP to see now regarding ?vegetation   Severe aortic stenosiss/pTAVR 08/18/2018 Valve Team has been following    Hypotension systolic blood pressure usually in the 100s - stable   Chronic systolic CHF EF 95-18% by TTE 09/04/2018 - does not appear to have acute decomposition of his CHF on clinical exam  Filed Weights   09/06/18 0500 09/07/18 0321 09/08/18 0511  Weight: 100.4 kg 97.9 kg 98.3 kg    History  of ventricular tachycardias/p ICD Continue amiodarone - appears he will need led/device removal +/- replacement   Hyperlipidemia Continue ezetimibe-simvastatin  Infrarenal AAA 4.4 cminfrarenal fusiform AAA seen on CT abdomen/pelvis - followed by TCTS  DVT prophylaxis: lovenox  Code Status: FULL CODE Family Communication: spoke with wife and daughter at bedside  Disposition Plan: SDU  Consultants:  TCTS ID  Antimicrobials:  Ceftriaxone 11/23 > 11/25 Merem 11/25 >  Objective: Blood pressure (!) 108/46, pulse 62, temperature 98 F (36.7 C), temperature source Oral, resp. rate 13, height 5\' 10"  (1.778 m), weight 98.3 kg, SpO2 97 %.  Intake/Output Summary (Last 24 hours) at 09/08/2018 1707 Last data filed at 09/08/2018 0842 Gross per 24 hour  Intake 805.58 ml  Output 1525 ml  Net -719.42 ml   Filed Weights   09/06/18 0500 09/07/18 0321 09/08/18 0511  Weight: 100.4 kg 97.9 kg 98.3 kg    Examination: General: No acute respiratory distress Lungs: CTA B - no wheezing  Cardiovascular: RRR - no M or rub  Abdomen: NT/ND, soft, BS+, no mass  Extremities: trace edema bilateral lower extremities  CBC: Recent Labs  Lab 09/04/18 1425 09/05/18 0344 09/07/18 0303 09/08/18 0719  WBC 6.4 8.1 4.4 3.8*  NEUTROABS 5.6  --   --   --   HGB 13.0 11.1* 10.4* 11.0*  HCT 36.9* 31.7* 31.6* 31.9*  MCV 95.3 96.6 97.8 96.4  PLT 108* 89* 90* 97*   Basic Metabolic Panel: Recent Labs  Lab 09/06/18 0250 09/07/18 0303 09/08/18 0719  NA 136 134* 135  K 3.1* 3.9 3.5  CL 104 104 105  CO2 25 24 23   GLUCOSE 124* 126* 129*  BUN 17 13 12   CREATININE 1.13 0.83 0.95  CALCIUM 8.1* 8.0*  8.1*  MG  --   --  2.3   GFR: Estimated Creatinine Clearance: 63.2 mL/min (by C-G formula based on SCr of 0.95 mg/dL).  Liver Function Tests: Recent Labs  Lab 09/05/18 0344 09/06/18 0250 09/07/18 0303 09/08/18 0719  AST 133* 65* 34 28  ALT 148* 100* 66* 47*  ALKPHOS 278* 219* 203* 175*    BILITOT 2.2* 1.6* 1.2 1.2  PROT 6.3* 6.1* 5.7* 5.7*  ALBUMIN 2.8* 2.6* 2.5* 2.4*    Cardiac Enzymes: Recent Labs  Lab 09/04/18 2044  TROPONINI 0.03*    HbA1C: Hgb A1c MFr Bld  Date/Time Value Ref Range Status  08/13/2018 04:09 AM 5.2 4.8 - 5.6 % Final    Comment:    (NOTE) Pre diabetes:          5.7%-6.4% Diabetes:              >6.4% Glycemic control for   <7.0% adults with diabetes      Recent Results (from the past 240 hour(s))  Blood Culture (routine x 2)     Status: Abnormal   Collection Time: 09/04/18  2:25 PM  Result Value Ref Range Status   Specimen Description BLOOD BLOOD RIGHT FOREARM  Final   Special Requests   Final    BOTTLES DRAWN AEROBIC AND ANAEROBIC Blood Culture adequate volume   Culture  Setup Time   Final    GRAM NEGATIVE RODS IN BOTH AEROBIC AND ANAEROBIC BOTTLES CRITICAL RESULT CALLED TO, READ BACK BY AND VERIFIED WITHDenton Brick [facility] 1761 09/05/18 A BROWNING Performed at [facility], [place] [address] [place], [place] [address]    Culture (A)  Final    KLEBSIELLA PNEUMONIAE Confirmed Extended Spectrum Beta-Lactamase Producer (ESBL).  In bloodstream infections from ESBL organisms, carbapenems are preferred over piperacillin/tazobactam. They are shown to have a lower risk of mortality.    Report Status 09/07/2018 FINAL  Final   Organism ID, Bacteria KLEBSIELLA PNEUMONIAE  Final      Susceptibility   Klebsiella pneumoniae - MIC*    AMPICILLIN >=32 RESISTANT Resistant     CEFAZOLIN >=64 RESISTANT Resistant     CEFEPIME RESISTANT Resistant     CEFTAZIDIME 32 RESISTANT Resistant     CEFTRIAXONE >=64 RESISTANT Resistant     CIPROFLOXACIN >=4 RESISTANT Resistant     GENTAMICIN >=16 RESISTANT Resistant     IMIPENEM <=0.25 SENSITIVE Sensitive     TRIMETH/SULFA >=320 RESISTANT Resistant     AMPICILLIN/SULBACTAM >=32 RESISTANT Resistant     PIP/TAZO 32 INTERMEDIATE Intermediate     Extended ESBL POSITIVE Resistant     * KLEBSIELLA  PNEUMONIAE  Blood Culture ID Panel (Reflexed)     Status: Abnormal   Collection Time: 09/04/18  2:25 PM  Result Value Ref Range Status   Enterococcus species NOT DETECTED NOT DETECTED Final   Listeria monocytogenes NOT DETECTED NOT DETECTED Final   Staphylococcus species NOT DETECTED NOT DETECTED Final   Staphylococcus aureus (BCID) NOT DETECTED NOT DETECTED Final   Streptococcus species NOT DETECTED NOT DETECTED Final   Streptococcus agalactiae NOT DETECTED NOT DETECTED Final   Streptococcus pneumoniae NOT DETECTED NOT DETECTED Final   Streptococcus pyogenes NOT DETECTED NOT DETECTED Final   Acinetobacter baumannii NOT DETECTED NOT DETECTED Final   Enterobacteriaceae species DETECTED (A) NOT DETECTED Final    Comment: Enterobacteriaceae represent a large family of gram-negative bacteria, not a single organism. CRITICAL RESULT CALLED TO, READ BACK BY AND VERIFIED WITH: G ABBOTT PHARMD [id number]  09/05/18 A BROWNING    Enterobacter cloacae complex NOT DETECTED NOT DETECTED Final   Escherichia coli NOT DETECTED NOT DETECTED Final   Klebsiella oxytoca NOT DETECTED NOT DETECTED Final   Klebsiella pneumoniae DETECTED (A) NOT DETECTED Final    Comment: CRITICAL RESULT CALLED TO, READ BACK BY AND VERIFIED WITH: Denton Brick PHARMD [id number] 09/05/18 A BROWNING    Proteus species NOT DETECTED NOT DETECTED Final   Serratia marcescens NOT DETECTED NOT DETECTED Final   Carbapenem resistance NOT DETECTED NOT DETECTED Final   Haemophilus influenzae NOT DETECTED NOT DETECTED Final   Neisseria meningitidis NOT DETECTED NOT DETECTED Final   Pseudomonas aeruginosa NOT DETECTED NOT DETECTED Final   Candida albicans NOT DETECTED NOT DETECTED Final   Candida glabrata NOT DETECTED NOT DETECTED Final   Candida krusei NOT DETECTED NOT DETECTED Final   Candida parapsilosis NOT DETECTED NOT DETECTED Final   Candida tropicalis NOT DETECTED NOT DETECTED Final    Comment: Performed at [facility], [address], [place], [place] [address]  Blood Culture (routine x 2)     Status: Abnormal   Collection Time: 09/04/18  2:30 PM  Result Value Ref Range Status   Specimen Description BLOOD BLOOD LEFT WRIST  Final   Special Requests   Final    BOTTLES DRAWN AEROBIC AND ANAEROBIC Blood Culture results may not be optimal due to an inadequate volume of blood received in culture bottles   Culture  Setup Time   Final    GRAM NEGATIVE RODS AEROBIC BOTTLE ONLY CRITICAL RESULT CALLED TO, READ BACK BY AND VERIFIED WITHDenton Brick [facility] 4650 09/05/18 A BROWNING Performed at [facility], [place] [address] [place], [place] [address]    Culture KLEBSIELLA ORNITHINOLYTICA (A)  Final   Report Status 09/08/2018 FINAL  Final   Organism ID, Bacteria KLEBSIELLA ORNITHINOLYTICA  Final      Susceptibility   Klebsiella ornithinolytica - MIC*    AMPICILLIN >=32 RESISTANT Resistant     CEFAZOLIN >=64 RESISTANT Resistant     CEFEPIME 4 SENSITIVE Sensitive     CEFTAZIDIME 16 INTERMEDIATE Intermediate     CEFTRIAXONE >=64 RESISTANT Resistant     CIPROFLOXACIN >=4 RESISTANT Resistant     GENTAMICIN >=16 RESISTANT Resistant     IMIPENEM <=0.25 SENSITIVE Sensitive     TRIMETH/SULFA >=320 RESISTANT Resistant     AMPICILLIN/SULBACTAM >=32 RESISTANT Resistant     PIP/TAZO 32 INTERMEDIATE Intermediate     * KLEBSIELLA ORNITHINOLYTICA  Culture, blood (Routine X 2) w Reflex to ID Panel     Status: None (Preliminary result)   Collection Time: 09/06/18  8:12 AM  Result Value Ref Range Status   Specimen Description BLOOD LEFT ANTECUBITAL  Final   Special Requests   Final    BOTTLES DRAWN AEROBIC ONLY Blood Culture adequate volume   Culture   Final    NO GROWTH 2 DAYS Performed at [facility], [address] [place], [place] [address]    Report Status PENDING  Incomplete     Scheduled Meds: . amiodarone  100 mg Oral Daily  . aspirin  81 mg Oral Daily  . clopidogrel  75 mg Oral Q breakfast  . enoxaparin (LOVENOX)  injection  40 mg Subcutaneous Q24H  . ezetimibe-simvastatin  1 tablet Oral QHS  . sodium chloride flush  3 mL Intravenous Q12H     LOS: 4 days   Cherene Altes, MD [facility] Office  [phone]  Pager - Text Page per Shea Evans  If 7PM-7AM, please contact night-coverage per Amion 09/08/2018, 5:07 PM

## 2018-09-08 NOTE — Progress Notes (Signed)
Physical Therapy Treatment Patient Details Name: William [name] MRN: [id number] DOB: April 05, 1928 Today's Date: 09/08/2018    History of Present Illness Pt is a 84 y.o. M with significant PMH of chronic systolic EF, ICM, severe AS s/p TAVR 11/5, hx of ventricular tachycardia s/p ICD, AAA, hyperlipidemia, prostate CA with recurrent UTI, who presents with sudden onset of fever, chills, fatigue. Recent admits 10/25-10/31 for septic shock secondary to UTI. Admitted 11/5-11/7 for TAVR. Concern for TAVR infection from previous UTI.     PT Comments    Pt making slow progress. Significant assist for mobility. Pt's wife plans to take pt home and resume [facility] program.    Follow Up Recommendations  Home health PT;Supervision/Assistance - 24 hour     Equipment Recommendations  None recommended by PT    Recommendations for Other Services       Precautions / Restrictions Precautions Precautions: Fall Required Braces or Orthoses: Other Brace/Splint Other Brace/Splint: Right wrist splint from recent wrist dislocation per pt family Restrictions Weight Bearing Restrictions: No    Mobility  Bed Mobility Overal bed mobility: Needs Assistance Bed Mobility: Sit to Supine       Sit to supine: Mod assist;+2 for physical assistance   General bed mobility comments: Assist to lower trunk and bring legs back up into bed  Transfers Overall transfer level: Needs assistance Equipment used: Rolling walker (2 wheeled) Transfers: Sit to/from [facility] Sit to Stand: Mod assist;+2 physical assistance Stand pivot transfers: Mod assist;+2 safety/equipment       General transfer comment: Assist to bring hips/trunk up. 2 person assist from low commode.   Ambulation/Gait Ambulation/Gait assistance: +2 physical assistance;Mod assist Gait Distance (Feet): 10 Feet Assistive device: Rolling walker (2 wheeled) Gait Pattern/deviations: Step-to pattern;Decreased step length -  right;Decreased step length - left;Shuffle;Trunk flexed;Drifts right/left Gait velocity: decr Gait velocity interpretation: <1.31 ft/sec, indicative of household ambulator General Gait Details: Pt with flexed posture and feet too far behind walker with lt foot outside of walker. Repeated verbal/tactile cues to move pt closer to walker. As pt fatigued began leaning posterior and rt and had to bring seat behing.   Stairs             Wheelchair Mobility    Modified Rankin (Stroke Patients Only)       Balance Overall balance assessment: Needs assistance Sitting-balance support: Feet supported;Bilateral upper extremity supported Sitting balance-Leahy Scale: Poor Sitting balance - Comments: Pt leaning anteriorly Postural control: Other (comment)(anterior) Standing balance support: Bilateral upper extremity supported;During functional activity Standing balance-Leahy Scale: Poor Standing balance comment: walker and min to mod assist for static standing.                            Cognition Arousal/Alertness: Awake/alert Behavior During Therapy: WFL for tasks assessed/performed Overall Cognitive Status: History of cognitive impairments - at baseline                                 General Comments: Baseline dementia. Needs repeated verbal/tactile cues       Exercises      General Comments        Pertinent Vitals/Pain Pain Assessment: No/denies pain    Home Living                      Prior Function  PT Goals (current goals can now be found in the care plan section) Progress towards PT goals: Progressing toward goals    Frequency    Min 3X/week      PT Plan Current plan remains appropriate    Co-evaluation              AM-PAC PT "6 Clicks" Mobility   Outcome Measure  Help needed turning from your back to your side while in a flat bed without using bedrails?: A Little Help needed moving from lying on  your back to sitting on the side of a flat bed without using bedrails?: A Lot Help needed moving to and from a bed to a chair (including a wheelchair)?: A Lot Help needed standing up from a chair using your arms (e.g., wheelchair or bedside chair)?: A Lot Help needed to walk in hospital room?: A Lot Help needed climbing 3-5 steps with a railing? : Total 6 Click Score: 12    End of Session Equipment Utilized During Treatment: Gait belt Activity Tolerance: Patient limited by fatigue Patient left: in bed;with call bell/phone within reach;with nursing/sitter in room;with family/visitor present Nurse Communication: Mobility status(Nurse and tech assisted with mobility) PT Visit Diagnosis: Unsteadiness on feet (R26.81);Other abnormalities of gait and mobility (R26.89);Muscle weakness (generalized) (M62.81)     Time: 9024-0973 PT Time Calculation (min) (ACUTE ONLY): 19 min  Charges:  $Gait Training: 8-22 mins                     [place] Pager [phone] Office [place] 09/08/2018, 12:35 PM

## 2018-09-08 NOTE — Progress Notes (Signed)
Valve Team Note:  Mr. Ropp is stable today. No complaints.  TEE later today. ID team following.  Nothing to add it this point.   Lauree Chandler 09/08/2018 11:51 AM

## 2018-09-08 NOTE — Interval H&P Note (Signed)
History and Physical Interval Note:  09/08/2018 2:41 PM  William [name]  has presented today for surgery, with the diagnosis of bactermia  The various methods of treatment have been discussed with the patient and family. After consideration of risks, benefits and other options for treatment, the patient has consented to  Procedure(s): TRANSESOPHAGEAL ECHOCARDIOGRAM (TEE) (N/A) as a surgical intervention .  The patient's history has been reviewed, patient examined, no change in status, stable for surgery.  I have reviewed the patient's chart and labs.  Questions were answered to the patient's satisfaction.     Kirk Ruths

## 2018-09-08 NOTE — Consult Note (Addendum)
Cardiology Consultation:   Patient ID: William [name] MRN: [id number]; DOB: 1928/10/12  Admit date: 09/04/2018 Date of Consult: 09/08/2018  Primary Care Provider: Haywood Pao, MD Primary Cardiologist: William Klein, MD  Primary Electrophysiologist:  None    Patient Profile:   William [name] is a 84 y.o. male with a hx of AAA, NICM, chronic CHF (systolic), VT w/ICD, OSA (not on CPAP), arthritis, CBP/lumbar vertebral fracture, prostate ca w/hx of recurrent UTIs/incontinance, and severe AS recently s/p TAVRwho is being seen today for the evaluation of bacteremia, possible vegitation on ICD lead by TEE at the request of William [name].  History of Present Illness:   William [name] was admitted to [facility] 08/07/18 with septic shock requiring vasopressor support 2/2 Klebsiella UTI (also noted to have an wrist dislocation, splinted not surgical), suspect to have some degree of baseline dementia as well.  BC (x2) were neg x 5days this admission Discharged 08/13/18. 08/18/18 underwent TAVR, discharged 08/20/18, post TAVR EKG noted 1st deg AV block, RBBB and LAF hemiblock.  In review of William [name] last note mentions hx of NSVT and sustained VT, unable to wean off amiodarone with recurrent VT.  Mentions hx of having ATP in 2016, no hx of shocks. No V pacing.  He is admitted now with c/o fever/chills, ER temp was 101.4, elevated LFTs and lactic acid.  Blood cultures grew gram neg rods, KLEBSIELLA ORNITHINOLYTICA, KLEBSIELLA PNEUMONIAE (repeat drawn 11/24 neg to date).  ID is on the case.  TEE done 09/08/18 Severe LV dysfunction; severe LAE; s/p AVR; trace AI; moderate (3+) MR; TV not well visualized; oscillating density on ICD lead concerning for vegetation.  He is without active complaints this morning  Device information MDT single chamber ICD implanted 01/11/14 CXR has an abandoned lead, one of the leads is a dual coil ICD lead Hx hx of having sprint Fidelis lead abandoned looks  like implant of this lead was 2005  Past Medical History:  Diagnosis Date  . AAA (abdominal aortic aneurysm) ([place])    7/13 3.8cm  . Arthritis    "joints" (01/11/2014)  . Asthma    "seasonal; some foods"   . Chronic systolic CHF (congestive heart failure) ([place])   . Dementia ([place])   . HLD (hyperlipidemia)   . HOH (hard of hearing)   . Lumbar vertebral fracture ([facility])    L1- 04/11/2014   . Prostate cancer ([place])   . S/P ICD (internal cardiac defibrillator) procedure, 01/11/14 removal of ERI gen and placement of Medtronic Evera XT VR & NEW Right ventricular lead Medtronic 01/12/2014  . S/P TAVR (transcatheter aortic valve replacement)    Edwards Sapien 3 THV (size 29 mm, model # [id number], serial # [id number])  . Severe aortic stenosis   . Sleep apnea    "lost 60# & don't have it anymore" (01/11/2014)  . Ventricular tachycardia [facility])     Past Surgical History:  Procedure Laterality Date  . CARDIAC CATHETERIZATION  08/20/2004   noncritical CAD,mild global hypokinesis, EF 50%  . CARDIAC DEFIBRILLATOR PLACEMENT  08/23/2004   Medtronic  . CATARACT EXTRACTION W/ INTRAOCULAR LENS IMPLANT Right   . COLECTOMY  1990's  . CRYOABLATION N/A 02/24/2014   Procedure: CRYO ABLATION PROSTATE;  Surgeon: William Rud, MD;  Location: [facility] ORS;  Service: Urology;  Laterality: N/A;  . HERNIA REPAIR     "abdomen; from colon OR"  . IMPLANTABLE CARDIOVERTER DEFIBRILLATOR (ICD) GENERATOR CHANGE N/A 01/11/2014   Procedure: ICD GENERATOR CHANGE;  Surgeon: William Gobble  Croitoru, MD;  Location: [place] CATH LAB;  Service: Cardiovascular;  Laterality: N/A;  . LEAD REVISION N/A 01/11/2014   Procedure: LEAD REVISION;  Surgeon: William Klein, MD;  Location: [place] CATH LAB;  Service: Cardiovascular;  Laterality: N/A;  . NM MYOCAR PERF WALL MOTION  01/29/2012   abnormal c/o infarct/scar,no ischemia present  . PROSTATE BIOPSY N/A 11/22/2013   Procedure: PROSTATE BIOPSY AND ULTRASOUND;  Surgeon: William Rud, MD;  Location: [facility]  ORS;  Service: Urology;  Laterality: N/A;  . RIGHT/LEFT HEART CATH AND CORONARY ANGIOGRAPHY N/A 07/06/2018   Procedure: RIGHT/LEFT HEART CATH AND CORONARY ANGIOGRAPHY;  Surgeon: William Sine, MD;  Location: [place] CV LAB;  Service: Cardiovascular;  Laterality: N/A;  . TEE WITHOUT CARDIOVERSION N/A 08/18/2018   Procedure: TRANSESOPHAGEAL ECHOCARDIOGRAM (TEE);  Surgeon: William Blanks, MD;  Location: [place];  Service: Open Heart Surgery;  Laterality: N/A;  . TRANSCATHETER AORTIC VALVE REPLACEMENT, TRANSFEMORAL  08/18/2018  . TRANSCATHETER AORTIC VALVE REPLACEMENT, TRANSFEMORAL N/A 08/18/2018   Procedure: TRANSCATHETER AORTIC VALVE REPLACEMENT, TRANSFEMORAL using an Edwards 79mm Aortic Valve;  Surgeon: William Blanks, MD;  Location: [place];  Service: Open Heart Surgery;  Laterality: N/A;     Home Medications:  Prior to Admission medications   Medication Sig Start Date End Date Taking? Authorizing Provider  acetaminophen (TYLENOL) 500 MG tablet Take 500 mg by mouth every 6 (six) hours as needed (for pain).    Yes [provider]  amiodarone (PACERONE) 200 MG tablet TAKE 1/2 TABLET BY MOUTH EVERY DAY Patient taking differently: Take 100 mg by mouth daily.  08/14/18  Yes [name], Mihai, MD  amoxicillin (AMOXIL) 500 MG capsule Take 4 tablets (2000 mg) 30-60 min prior to dental procedures Patient taking differently: Take 2,000 mg by mouth See admin instructions. Take 4 tablets (2000 mg) by mouth 30-60 min prior to dental procedures 08/27/18  Yes William Stanford, PA-C  aspirin 81 MG chewable tablet Chew 1 tablet (81 mg total) by mouth daily. 07/08/18  Yes [name], William Cover., PA-C  cholecalciferol (VITAMIN D) 1000 units tablet Take 2,000 Units by mouth at bedtime.   Yes [provider]  clopidogrel (PLAVIX) 75 MG tablet Take 1 tablet (75 mg total) by mouth daily with breakfast. 08/21/18  Yes William Stanford, PA-C  ezetimibe-simvastatin (VYTORIN) 10-20 MG per  tablet Take 1 tablet by mouth at bedtime.   Yes [provider]  furosemide (LASIX) 20 MG tablet Take 1 tablet (20 mg total) by mouth daily. 07/08/18 10/06/18 Yes [name], Mihai, MD  metoprolol succinate (TOPROL-XL) 50 MG 24 hr tablet Take 1 tablet (50 mg total) by mouth daily. Take with or immediately following a meal. 09/01/18  Yes William Stanford, PA-C  mineral oil-hydrophilic petrolatum (AQUAPHOR) ointment Apply 1 application topically daily as needed for dry skin.   Yes [provider]  Multiple Vitamins-Minerals (PRESERVISION AREDS 2 PO) Take 1 capsule by mouth daily.    Yes [provider]  polyethylene glycol (MIRALAX / GLYCOLAX) packet Take 17 g by mouth every evening. Patient taking differently: Take by mouth See admin instructions. Dissolve 5 mls miralax powder and water and drink daily with supper 01/21/17  Yes [name], Mihai, MD  potassium chloride (K-DUR) 10 MEQ tablet Take 1 tablet (10 mEq total) by mouth daily. 08/20/18  Yes William Stanford, PA-C  Triamcinolone Acetonide (TRIAMCINOLONE 0.1 % CREAM : EUCERIN) CREA Apply 1 application topically daily as needed for rash or itching.    Yes  [provider]    Inpatient Medications: Scheduled Meds: . amiodarone  100 mg Oral Daily  . aspirin  81 mg Oral Daily  . clopidogrel  75 mg Oral Q breakfast  . enoxaparin (LOVENOX) injection  40 mg Subcutaneous Q24H  . ezetimibe-simvastatin  1 tablet Oral QHS  . sodium chloride flush  3 mL Intravenous Q12H   Continuous Infusions: . meropenem (MERREM) IV 1 g (09/08/18 1309)   PRN Meds: acetaminophen, polyethylene glycol  Allergies:    Allergies  Allergen Reactions  . Crestor [Rosuvastatin] Other (See Comments)    Hurts muscles  . Lipitor [Atorvastatin] Other (See Comments)    Hurts stomach    Social History:   Social History   Socioeconomic History  . Marital status: Married    Spouse name: Not on file  . Number of children: 2  . Years  of education: Not on file  . Highest education level: Not on file  Occupational History  . Occupation: Owned a Scientist, [employer]  Social Needs  . Financial resource strain: Not on file  . Food insecurity:    Worry: Not on file    Inability: Not on file  . Transportation needs:    Medical: Not on file    Non-medical: Not on file  Tobacco Use  . Smoking status: Never Smoker  . Smokeless tobacco: Never Used  Substance and Sexual Activity  . Alcohol use: Yes    Alcohol/week: 0.0 standard drinks    Comment: 01/11/2014 "drink 1/2 of a beer twice per month   . Drug use: No  . Sexual activity: Not Currently  Lifestyle  . Physical activity:    Days per week: Not on file    Minutes per session: Not on file  . Stress: Not on file  Relationships  . Social connections:    Talks on phone: Not on file    Gets together: Not on file    Attends religious service: Not on file    Active member of club or organization: Not on file    Attends meetings of clubs or organizations: Not on file    Relationship status: Not on file  . Intimate partner violence:    Fear of current or ex partner: Not on file    Emotionally abused: Not on file    Physically abused: Not on file    Forced sexual activity: Not on file  Other Topics Concern  . Not on file  Social History Narrative  . Not on file    Family History:   Family History  Problem Relation Age of Onset  . Heart failure Mother        Died of "old age" at 44  . Pneumonia Father      ROS:  Please see the history of present illness.  All other ROS reviewed and negative.     Physical Exam/Data:   Vitals:   09/08/18 1505 09/08/18 1517 09/08/18 1520 09/08/18 1530  BP: (!) 151/96 (!) 119/46 (!) 119/46 (!) 108/46  Pulse: 100 88 (!) 41 62  Resp: (!) 21 20 17 13   Temp:  98 F (36.7 C)    TempSrc:  Oral    SpO2: 98% 97% 97% 97%  Weight:      Height:        Intake/Output Summary (Last 24 hours) at 09/08/2018 1611 Last data filed  at 09/08/2018 0842 Gross per 24 hour  Intake 805.58 ml  Output 1525 ml  Net -719.42 ml  Filed Weights   09/06/18 0500 09/07/18 0321 09/08/18 0511  Weight: 100.4 kg 97.9 kg 98.3 kg   Body mass index is 31.09 kg/m.  General:  Well nourished, well developed, in no acute distress HEENT: normal Lymph: no adenopathy Neck: no JVD Endocrine:  No thryomegaly Vascular: No carotid bruits  Cardiac:   RRR; no murmurs, gallops or rubs Lungs:  CTA b/l, no wheezing, rhonchi or rales  Abd: soft, nontender Ext: no edema Musculoskeletal:  No deformities Skin: warm and dry  Neuro:   no focal abnormalities noted Psych:  Normal affect   EKG:  The EKG was personally reviewed and demonstrates:   Looks ST, PVCs Telemetry:  Telemetry was personally reviewed and demonstrates:   SR, occ PVCs, infrequent couplets   Relevant CV Studies:  09/04/18; TTE Study Conclusions - Left ventricle: The cavity size was normal. Wall thickness was   increased in a pattern of mild LVH. Systolic function was   moderately reduced. The estimated ejection fraction was in the   range of 35% to 40%. Diffuse hypokinesis. There is akinesis of   the inferoseptal myocardium. - Aortic valve: A TAVR bioprosthesis was present. Peak velocity   (S): 224 cm/s. Mean gradient (S): 10 mm Hg. Valve area (VTI):   2.63 cm^2. Valve area (Vmax): 2.54 cm^2. Valve area (Vmean): 2.45   cm^2. - Mitral valve: There was mild regurgitation. - Left atrium: The atrium was mildly dilated. - Pulmonary arteries: Systolic pressure was mildly increased. PA   peak pressure: 34 mm Hg (S). Impressions: - There was no evidence of a vegetation. Consider TEE if further   sensitivity is needed  Laboratory Data:  Chemistry Recent Labs  Lab 09/06/18 0250 09/07/18 0303 09/08/18 0719  NA 136 134* 135  K 3.1* 3.9 3.5  CL 104 104 105  CO2 25 24 23   GLUCOSE 124* 126* 129*  BUN 17 13 12   CREATININE 1.13 0.83 0.95  CALCIUM 8.1* 8.0* 8.1*    GFRNONAA 56* >60 >60  GFRAA >60 >60 >60  ANIONGAP 7 6 7     Recent Labs  Lab 09/06/18 0250 09/07/18 0303 09/08/18 0719  PROT 6.1* 5.7* 5.7*  ALBUMIN 2.6* 2.5* 2.4*  AST 65* 34 28  ALT 100* 66* 47*  ALKPHOS 219* 203* 175*  BILITOT 1.6* 1.2 1.2   Hematology Recent Labs  Lab 09/05/18 0344 09/07/18 0303 09/08/18 0719  WBC 8.1 4.4 3.8*  RBC 3.28* 3.23* 3.31*  HGB 11.1* 10.4* 11.0*  HCT 31.7* 31.6* 31.9*  MCV 96.6 97.8 96.4  MCH 33.8 32.2 33.2  MCHC 35.0 32.9 34.5  RDW 15.8* 15.7* 15.7*  PLT 89* 90* 97*   Cardiac Enzymes Recent Labs  Lab 09/04/18 2044  TROPONINI 0.03*   No results for input(s): TROPIPOC in the last 168 hours.  BNPNo results for input(s): BNP, PROBNP in the last 168 hours.  DDimer No results for input(s): DDIMER in the last 168 hours.  Radiology/Studies:   Ct Abdomen Pelvis W Contrast Result Date: 09/04/2018 CLINICAL DATA:  Abdominal pain and fever EXAM: CT ABDOMEN AND PELVIS WITH CONTRAST TECHNIQUE: Multidetector CT imaging of the abdomen and pelvis was performed using the standard protocol following bolus administration of intravenous contrast. CONTRAST:  128mL OMNIPAQUE IOHEXOL 300 MG/ML  SOLN COMPARISON:  08/03/2018 FINDINGS: Lower chest: Cardiomegaly with evidence of prior TAVR. Coronary arteriosclerosis is noted. Bibasilar dependent atelectasis. Hepatobiliary: Numerous gallstones are identified within the gallbladder without secondary signs of cholecystitis. No mural thickening or pericholecystic fluid is noted.  No space-occupying mass of the liver nor biliary dilatation is seen. Pancreas: Atrophic pancreas without inflammation or ductal dilatation. Tiny subcentimeter cystic foci noted of the body of the pancreas, stable in appearance dating back to 2016 consistent benign findings. Spleen: Normal Adrenals/Urinary Tract: Normal bilateral adrenal glands. Bilateral renal cysts, the largest on the right is interpolar and posterior with punctate calcification  associated with it. It measures up to 4.3 cm in diameter. The left kidney demonstrates a 4.5 cm in diameter upper pole renal cyst with smaller interpolar cysts noted. No hydroureteronephrosis. The urinary bladder is decompressed in appearance. Stomach/Bowel: The stomach is decompressed in appearance. The duodenal sweep and ligament of Treitz are normal. No small bowel obstruction, mass or inflammation. Normal appearing appendix. Nonobstructed large bowel without inflammatory change. Scattered colonic diverticulosis without acute diverticulitis is identified along the descending sigmoid colon. Vascular/Lymphatic: 4.4 cm infrarenal fusiform abdominal aortic aneurysm with moderate aortoiliac and branch vessel atherosclerosis. Findings are unchanged from recent comparison. No lymphadenopathy by CT size criteria. Reproductive: Normal size prostate and seminal vesicles. Nonspecific metallic density projects between the rectum posterior wall of bladder. Other: No free air or free fluid. 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 IMPRESSION: 1. 4.4 cm infrarenal fusiform abdominal aortic aneurysm. Recommend followup by ultrasound in 1 year. This recommendation follows ACR consensus guidelines: White Paper of the ACR Incidental Findings Committee II on Vascular Findings. J Am Coll Radiol 2013; 10:789-794. 2. Bilateral simple and complex renal cysts. 3. Uncomplicated cholelithiasis. 4. Colonic diverticulosis without acute diverticulitis. 5. 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 Electronically Signed   By: Ashley Royalty M.D.   On: 09/04/2018 17:56     US Abdomen Limited Ruq Result Date: 09/04/2018 CLINICAL DATA:  RIGHT upper quadrant pain. EXAM: ULTRASOUND ABDOMEN LIMITED RIGHT UPPER QUADRANT COMPARISON:  CT abdomen and pelvis September 04, 2018 FINDINGS: Gallbladder: Numerous echogenic stones within the gallbladder measuring less than 1 cm without  gallbladder wall thickening or pericholecystic fluid. Wall echo shadow sign. No sonographic Murphy sign elicited. Common bile duct: Diameter: 8 mm, normal for age. Liver: No focal lesion identified. Within normal limits in parenchymal echogenicity. Portal vein is patent on color Doppler imaging with normal direction of blood flow towards the liver. IMPRESSION: 1. Cholelithiasis without sonographic findings of acute cholecystitis. Electronically Signed   By: Elon Alas M.D.   On: 09/04/2018 21:25    Assessment and Plan:   1. ESBL kelbsiella bacteremia     Blood cx showing 2 different isolates of kleb species        2. TEE with what appears a vegitation on device lead 3. Hx of NICM w/VT     Patient has an abandoned Srint Fidelis lead, implanted 2005      New lead and generator from 2015     No hx of CABG     Has TAVR from a few weeks ago  Device check done today <0.1% VP Lifetime for this device is the one known ATP in 2016, no shocks 3, NSVT only noted since last interrogation, longest 4 sec        Dr. Lovena Le has seen and examined the patient. He will need device extraction, discussed this with the patient.  Initially thought would be Friday, though unable to.   Dr. Lovena Le has discussed with ID We will plan for Tuesday 09/15/18.       For questions or updates, please contact [place] Please  consult [email or web address] for contact info under     Signed, Baldwin Jamaica, PA-C  09/08/2018 4:11 PM   EP attending  Patient seen and examined.  Agree with the findings as noted above.  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  He has been treated with intravenous antibiotics and has had a marked improvement.  Transesophageal echo demonstrates a vegetation on his defibrillator lead.  Review of his chest x-ray demonstrates both a single  and dual coil ICD lead in place.  The dual coil lead is a 6949-lead placed in 2005.  I discussed the treatment options with the patient as well as a discussion with his infectious disease attending.  There is no good antibiotic therapy for the patient.  Extraction of his system is recommended and I have discussed the risk, benefits, goals, and expectations of this procedure with the patient and his daughter.  Unfortunately his being in the hospital during the holidays will make scheduling of his extraction more difficult.  We will plan to proceed with extraction of his device as our schedule allows in several days.  In the meantime he will continue intravenous antibiotics.  If the patient were to become unstable, emergent extraction would be a consideration.  Cristopher Peru, MD

## 2018-09-09 DIAGNOSIS — Z954 Presence of other heart-valve replacement: Secondary | ICD-10-CM

## 2018-09-09 DIAGNOSIS — I350 Nonrheumatic aortic (valve) stenosis: Secondary | ICD-10-CM

## 2018-09-09 LAB — COMPREHENSIVE METABOLIC PANEL
ALK PHOS: 155 U/L — AB (ref 38–126)
ALT: 42 U/L (ref 0–44)
ANION GAP: 8 (ref 5–15)
AST: 26 U/L (ref 15–41)
Albumin: 2.5 g/dL — ABNORMAL LOW (ref 3.5–5.0)
BUN: 13 mg/dL (ref 8–23)
CALCIUM: 8.2 mg/dL — AB (ref 8.9–10.3)
CO2: 21 mmol/L — ABNORMAL LOW (ref 22–32)
Chloride: 103 mmol/L (ref 98–111)
Creatinine, Ser: 0.92 mg/dL (ref 0.61–1.24)
GFR calc non Af Amer: >60 mL/min (ref >60)
GLUCOSE: 126 mg/dL — AB (ref 70–99)
Potassium: 3.7 mmol/L (ref 3.5–5.1)
Sodium: 132 mmol/L — ABNORMAL LOW (ref 135–145)
Total Bilirubin: 1.4 mg/dL — ABNORMAL HIGH (ref 0.3–1.2)
Total Protein: 5.6 g/dL — ABNORMAL LOW (ref 6.5–8.1)

## 2018-09-09 LAB — CBC WITH DIFFERENTIAL/PLATELET
Abs Immature Granulocytes: 0.02 10*3/uL (ref 0.00–0.07)
BASOS PCT: 1 %
Basophils Absolute: 0 10*3/uL (ref 0.0–0.1)
EOS PCT: 10 %
Eosinophils Absolute: 0.4 10*3/uL (ref 0.0–0.5)
HEMATOCRIT: 33 % — AB (ref 39.0–52.0)
Hemoglobin: 11.4 g/dL — ABNORMAL LOW (ref 13.0–17.0)
IMMATURE GRANULOCYTES: 1 %
Lymphocytes Relative: 18 %
Lymphs Abs: 0.7 10*3/uL (ref 0.7–4.0)
MCH: 33.6 pg (ref 26.0–34.0)
MCHC: 34.5 g/dL (ref 30.0–36.0)
MCV: 97.3 fL (ref 80.0–100.0)
MONOS PCT: 7 %
Monocytes Absolute: 0.3 10*3/uL (ref 0.1–1.0)
NEUTROS PCT: 63 %
NRBC: 0 % (ref 0.0–0.2)
Neutro Abs: 2.7 10*3/uL (ref 1.7–7.7)
Platelets: 106 10*3/uL — ABNORMAL LOW (ref 150–400)
RBC: 3.39 MIL/uL — ABNORMAL LOW (ref 4.22–5.81)
RDW: 15.8 % — AB (ref 11.5–15.5)
WBC: 4.2 10*3/uL (ref 4.0–10.5)

## 2018-09-09 LAB — MAGNESIUM: MAGNESIUM: 2.2 mg/dL (ref 1.7–2.4)

## 2018-09-09 NOTE — Progress Notes (Signed)
Endocarditis Evaluation   Infective endocarditis (IE) is associated with a high rate of complications and mortality.  Specific aspects of clinical management are critical to optimizing the outcome of patients with IE. Therefore, the Pharmacy Residency Program at [facility] has initiated an evidence-based consult aimed at improving the management of IE at [facility].     Comments  Consult to ID Outpatient follow-up appointment to be made prior to discharge.  Utilization of ID recommended antibiotic therapy   Surgery Evaluation Cardiothoracic surgery consult if the following indications are present:  - Heart failure associated to valve dysfunction - Endocarditis complicated by heart block or aortic abscess - Left-sided endocarditis caused by S. aureus, fungal, or multi-drug resistant organisms - Left-sided endocarditis with severe valve dysfunction - Large vegetation (>1 cm) on aortic or mitral valve - Persistence of infection 5-7 days after initiation of appropriate antibiotic therapy - Recurrent emboli and persistent vegetations despite appropriate antibiotic therapy - Any history with left-sided embolization with persistent vegetations on TEE  Consult electrophysiologist if presence of implanted cardiac device (pacemaker, ICD)   Consult to cardiology if left ventricular ejection fraction is <40% Evaluation for appropriate heart failure medications upon discharge: - ACE/ARB - Diuretic - Beta blocker  Outpatient follow-up appointment to be made prior to discharge.   Based on the pharmacist evaluation of this patient, the following are recommended to complete a thorough evaluation and care plan to reflect guideline-recommended therapy:  1. Consult to cardiology with LVEF 25-30% 2. Evaluation for appropriate heart failure medications upon discharge:  ACE/ARB  Diuretic   Beta blocker 3. Outpatient ID follow up appointment to be made prior to discharge 4. Outpatient Cardiology follow  up appointment to be made prior to discharge  Vertis Kelch, PharmD PGY1 Pharmacy Resident Phone [phone] 09/09/2018       1:42 PM

## 2018-09-09 NOTE — Progress Notes (Addendum)
[address]       [place] [address]             [phone]      1 Day Post-Op Procedure(s) (LRB): TRANSESOPHAGEAL ECHOCARDIOGRAM (TEE) (N/A) Subjective: He feels okay this morning. No issues overnight.  Objective: Vital signs in last 24 hours: Temp:  [97.2 F (36.2 C)-98.7 F (37.1 C)] 97.2 F (36.2 C) (11/27 0327) Pulse Rate:  [41-106] 91 (11/27 0327) Cardiac Rhythm: Normal sinus rhythm;Heart block (11/27 0700) Resp:  [12-26] 21 (11/27 0327) BP: (95-151)/(33-106) 107/66 (11/27 0327) SpO2:  [95 %-100 %] 100 % (11/27 0327) Weight:  [98.3 kg] 98.3 kg (11/27 0402)     Intake/Output from previous day: 11/26 0701 - 11/27 0700 In: 3 [I.V.:3] Out: -  Intake/Output this shift: No intake/output data recorded.  General appearance: alert, cooperative and no distress Heart: regular rate and rhythm, S1, S2 normal, no murmur, click, rub or gallop Lungs: clear to auscultation bilaterally Abdomen: soft, non-tender; bowel sounds normal; no masses,  no organomegaly Extremities: 1-2+ pitting edema Wound: n/a  Lab Results: Recent Labs    09/07/18 0303 09/08/18 0719  WBC 4.4 3.8*  HGB 10.4* 11.0*  HCT 31.6* 31.9*  PLT 90* 97*   BMET:  Recent Labs    09/07/18 0303 09/08/18 0719  NA 134* 135  K 3.9 3.5  CL 104 105  CO2 24 23  GLUCOSE 126* 129*  BUN 13 12  CREATININE 0.83 0.95  CALCIUM 8.0* 8.1*    PT/INR: No results for input(s): LABPROT, INR in the last 72 hours. ABG    Component Value Date/Time   PHART 7.503 (H) 08/13/2018 0435   HCO3 24.6 08/13/2018 0435   TCO2 28 08/18/2018 1039   O2SAT 93.8 08/13/2018 0435   CBG (last 3)  No results for input(s): GLUCAP in the last 72 hours.  Assessment/Plan: S/P Procedure(s) (LRB): TRANSESOPHAGEAL ECHOCARDIOGRAM (TEE) (N/A)  1. CV-1st degree AVB, HR in the 90s. BP stable. Continue Plavix and ASA. Also on Amio 100mg  daily 2. Pulm-tolerating room air with excellent oxygen saturation 3.  Renal-creatinine 0.95, electrolytes okay, weight is stable, making excellent urine 4. H and H 11.0/31.9 5. ID-continue meropenem q 8hours, no fevers  6. Continue PT/OT  Plan: TEE showed possible vegetation on ICD lead-EP consulted. He will likely need a lead exchange. Will discuss providing backup with our office. Watch fluid status closely. BP too low for any aggressive diuresis however, he does appear mildly fluid overloaded.    LOS: 5 days    [name] 09/09/2018   Chart reviewed, patient examined, agree with above. The patient is known to me from his recent TAVR procedure.  He had a urinary tract infection with Klebsiella preoperatively which required admission and treatment with intravenous antibiotics followed by a course of oral antibiotics and postponement of his surgery.  He subsequently underwent TAVR and had an uncomplicated postoperative course.  According to his family he was doing well at home and is appeared to have significantly more stamina than he did preoperatively.  They felt that he was doing well until he suddenly went downhill prompting admission for sepsis.  He had positive blood cultures for Klebsiella.  Unfortunately his urine was not cultured during this admission so it is not clear if he had recurrent urinary tract infection leading to sepsis or whether his prior Klebsiella UTI it resulted in infection of his ICD lead.  I have personally reviewed his transesophageal  echo.  There is filamentous debris on the ICD lead which is most consistent with infection given his positive blood cultures.  He is scheduled for removal of his ICD system early next week.  His transcatheter aortic valve is functioning normally with trivial perivalvular leak which she had previously and is expected.  There is no sign of vegetation.  Once this is cleared I think will be important for him to follow-up with a urologist to be sure that he does not have an anatomic abnormality leading to his  urinary tract infection so that hopefully he will not develop recurrent urinary tract infection which may lead to prosthetic valve infection.  I spent a long time talking with his daughter this morning and she said that she will make sure that he has urology follow-up after he leaves the hospital.  From a heart valve standpoint there is nothing else to do at this time other than continue his intravenous antibiotics for the complete course as recommended by infectious disease.

## 2018-09-09 NOTE — Progress Notes (Signed)
Occupational Therapy Treatment Patient Details Name: William [name] MRN: [id number] DOB: 03-24-1928 Today's Date: 09/09/2018    History of present illness Pt is a 84 y.o. M with significant PMH of chronic systolic EF, ICM, severe AS s/p TAVR 11/5, hx of ventricular tachycardia s/p ICD, AAA, hyperlipidemia, prostate CA with recurrent UTI, who presents with sudden onset of fever, chills, fatigue. Recent admits 10/25-10/31 for septic shock secondary to UTI. Admitted 11/5-11/7 for TAVR. Concern for TAVR infection from previous UTI.    OT comments  This 84 yo male admitted with above presents to acute OT making progress today with bed mobility, sit<>stand transfers and ambulation increasing ease that family can take care of him.  He will continue to benefit from acute OT with follow up on CIR (wife does not want SNF and CIR would give him the best chance to get as mobile as possible before home).  Follow Up Recommendations  Home health OT;Supervision/Assistance - 24 hour    Equipment Recommendations  Other (comment)(?transport or W/C)    Recommendations for Other Services Rehab consult    Precautions / Restrictions Precautions Precautions: Fall Required Braces or Orthoses: Other Brace Other Brace: Right wrist splint from recent wrist dislocation per pt family; but can use walker with splint on per family Restrictions Weight Bearing Restrictions: No       Mobility Bed Mobility Overal bed mobility: Needs Assistance Bed Mobility: Sit to Supine     Supine to sit: Mod assist     General bed mobility comments: A to bring trunk up and then VCs to scoot forward  Transfers Overall transfer level: Needs assistance Equipment used: Rolling walker (2 wheeled) Transfers: Sit to/from Stand Sit to Stand: Min assist;+2 physical assistance         General transfer comment: from raised bed x1 and from recliner x4; Pt ambulated 8 feet, 5 feet, 15 feet with VCs to stand up tall and trying  to stay closer to RW.    Balance Overall balance assessment: Needs assistance Sitting-balance support: No upper extremity supported;Feet supported Sitting balance-Leahy Scale: Fair     Standing balance support: Bilateral upper extremity supported;During functional activity Standing balance-Leahy Scale: Poor Standing balance comment: DME and addtional A                           ADL either performed or assessed with clinical judgement   ADL Overall ADL's : Needs assistance/impaired                         Toilet Transfer: Minimal assistance;+2 for physical assistance Toilet Transfer Details (indicate cue type and reason): sit<>stand from bed and recliner; ambulating 5-15 feet with +2 min A for safety and A Toileting- Clothing Manipulation and Hygiene: Total assistance Toileting - Clothing Manipulation Details (indicate cue type and reason): Min A +2 sit<>stand and min-Mod A plus VCs to maintain standing             Vision Baseline Vision/History: Wears glasses Wears Glasses: At all times            Cognition Arousal/Alertness: Awake/alert Behavior During Therapy: WFL for tasks assessed/performed Overall Cognitive Status: History of cognitive impairments - at baseline                                 General Comments: Baseline dementia. Needs repeated verbal/tactile  cues                    Pertinent Vitals/ Pain       Pain Assessment: No/denies pain         Frequency  Min 3X/week        Progress Toward Goals  OT Goals(current goals can now be found in the care plan section)  Progress towards OT goals: Progressing toward goals     Plan Discharge plan needs to be updated       AM-PAC OT "6 Clicks" Daily Activity     Outcome Measure   Help from another person eating meals?: A Little Help from another person taking care of personal grooming?: A Little Help from another person toileting, which includes using  toliet, bedpan, or urinal?: A Lot Help from another person bathing (including washing, rinsing, drying)?: A Lot Help from another person to put on and taking off regular upper body clothing?: A Lot Help from another person to put on and taking off regular lower body clothing?: Total 6 Click Score: 13    End of Session Equipment Utilized During Treatment: Gait belt;Rolling walker  OT Visit Diagnosis: Unsteadiness on feet (R26.81);Other abnormalities of gait and mobility (R26.89);Muscle weakness (generalized) (M62.81);Other symptoms and signs involving cognitive function   Activity Tolerance Patient tolerated treatment well   Patient Left in chair;with call bell/phone within reach;with chair alarm set;with family/visitor present   Nurse Communication (IV leaking)        Time: 1194-1740 OT Time Calculation (min): 34 min  Charges: OT General Charges $OT Visit: 1 Visit OT Treatments $Self Care/Home Management : 23-37 mins [place], OTR/L Acute [employer] Pager [phone] Office [phone]      Almon Register 09/09/2018, 4:12 PM

## 2018-09-09 NOTE — Progress Notes (Signed)
PROGRESS NOTE    William Plasencia [name]  WRU:[id number] DOB: 04-21-1928 DOA: 09/04/2018 PCP: Haywood Pao, MD    Brief Narrative:  84 y.o.malewith a hx of Chronic Systolic CHF (EF 91-47%), Severe AoS s/p TAVR 08/18/18, ventricular tachycardia s/p ICD,AAA,hyperlipidemia, prostate cancer, recurrent UTI, and degenerative arthritis who presentedto the hospital with sudden onset of fevers, chills, and fatigue.  Patient was recently admitted from 08/07/2018-08/13/2018 for septic shock secondary to Klebsiella UTI. He was admitted again from 08/18/2018-08/20/2018 for successful TAVR procedure.  In the ED he was found to have a pulse of 120, RR 26, temp 101.86F. Urinalysis was negative. CXR showed a right basilar opacity suggestive of atelectasis versus airspace disease/pneumonia. CT abdomen/pelvis with contrast showed uncomplicated cholelithiasis, 4.4 cm infrarenal AAA, bilateral simple and complex renal cyst, and colonic diverticulosis without acute diverticulitis.   Assessment & Plan:   Principal Problem:   Sepsis ([facility]) Active Problems:   Nonischemic cardiomyopathy ([facility])   Hyperlipidemia   S/P ICD (internal cardiac defibrillator) procedure, 01/11/14 removal of ERI gen and placement of Medtronic Evera XT VR & NEW Right ventricular lead Medtronic   Ventricular tachycardia ([facility])   S/P TAVR (transcatheter aortic valve replacement)   Bacteremia due to Klebsiella pneumoniae  1 ESBL Klebsiella bacteremia/pacemaker lead vegetation Patient presented with fevers and chills blood cultures positive for ESBL Klebsiella bacteremia.  Patient underwent 2D echo which showed a EF of 35 to 40%, diffuse hypokinesis, akinesis of the inferior septal myocardium, TAVR bioprosthesis present with no evidence of vegetation.  Patient status post TEE on 09/08/2018 concerning for vegetation to lead.  Patient seen in consultation by ID who are recommending 6 weeks of IV meropenem.  Repeat blood cultures done  09/06/2018 with no growth to date.  Patient seen by cardiology, cardiothoracic surgery as well as the EP team for further evaluation for lead exchange.  Follow.  2.  Severe aortic stenosis status post TAVR 08/18/2018 Valve team following.  3.  Hypotension Patient noted to have systolic blood pressure usually in the 100s which seems stable.  Monitor closely with IV fluids.  Saline lock IV fluids for now.  4.  Chronic systolic heart failure/nonischemic cardiomyopathy Patient noted per 2D echo of 09/04/2018 12 a EF of 35 to 40%.  Patient does not clinically look decompensated.  Current weight is 98.3 kg today from 98.3 kg from 97.9 kg from 100.4 kg.  Monitor volume status closely.  Saline lock IV fluids.  Per cardiology.  5.  Hyperlipidemia Continue Vytorin.  6.  Infrarenal AAA Patient with a 4.4 cm infrarenal fusiform AAA seen on CT abdomen and pelvis.  Followed by T CTS.  7.  History of ventricular tachycardia status post ICD Continue amiodarone.  Patient be evaluated per EP for probable lead exchange versus lead removal.  Per EP.  8.  Anemia Patient with no overt bleeding.  Hemoglobin currently stable at 11.4.  Follow H&H.  Transfusion threshold hemoglobin less than 7.   DVT prophylaxis: Lovenox Code Status: Full Family Communication: Updated patient and daughter at bedside. Disposition Plan: Per cardiology.   Consultants:   Cardiothoracic surgery: Dr. Alvan Dame 09/04/2018  Infectious disease: Dr. Johnnye Sima 09/05/2018  Cardiology/valve team Dr. Angelena Form 09/08/2018  Cardiology: Dr. Debara Pickett 09/06/2018  Electrophysiology: Dr. Lovena Le 09/09/2018  Procedures:   CT abdomen and pelvis 09/04/2018  Chest x-ray 09/04/2018  2D echo 09/04/2018  Transesophageal echocardiogram 09/08/2018--- severe global reduction in LV systolic function: Status post AVR with trace periprosthetic AI, moderate MR,severe LAE, oscillating density on pacer wire  concerning for vegetation.--- Per Dr.  Stanford Breed  Antimicrobials:   IV cefepime 09/04/2018>>>> 09/05/2018  IV Rocephin 09/05/2018>>>>> 09/07/2018  IV Merrem 09/07/2018  IV vancomycin 09/04/2018>>>>> 09/05/2018   Subjective: Laying in bed.  Denies any chest pain or shortness of breath.  Daughter at bedside.  No fevers noted overnight.  Objective: Vitals:   09/08/18 2307 09/09/18 0326 09/09/18 0327 09/09/18 0402  BP: (!) 108/57  107/66   Pulse: 97 96 91   Resp: 17  (!) 21   Temp: 98.7 F (37.1 C) (!) 97.2 F (36.2 C) (!) 97.2 F (36.2 C)   TempSrc: Oral Axillary Axillary   SpO2: 97%  100%   Weight:    98.3 kg  Height:       No intake or output data in the 24 hours ending 09/09/18 0941 Filed Weights   09/07/18 0321 09/08/18 0511 09/09/18 0402  Weight: 97.9 kg 98.3 kg 98.3 kg    Examination:  General exam: Appears calm and comfortable  Respiratory system: Clear to auscultation.  No wheezes, no crackles, no rhonchi.  Respiratory effort normal. Cardiovascular system: S1 & S2 heard, RRR. No JVD, murmurs, rubs, gallops or clicks. No pedal edema. Gastrointestinal system: Abdomen is nondistended, soft and nontender. No organomegaly or masses felt. Normal bowel sounds heard. Central nervous system: Alert and oriented. No focal neurological deficits. Extremities: Symmetric 5 x 5 power. Skin: No rashes, lesions or ulcers Psychiatry: Judgement and insight appear normal. Mood & affect appropriate.     Data Reviewed: I have personally reviewed following labs and imaging studies  CBC: Recent Labs  Lab 09/04/18 1425 09/05/18 0344 09/07/18 0303 09/08/18 0719 09/09/18 0826  WBC 6.4 8.1 4.4 3.8* 4.2  NEUTROABS 5.6  --   --   --  2.7  HGB 13.0 11.1* 10.4* 11.0* 11.4*  HCT 36.9* 31.7* 31.6* 31.9* 33.0*  MCV 95.3 96.6 97.8 96.4 97.3  PLT 108* 89* 90* 97* 004*   Basic Metabolic Panel: Recent Labs  Lab 09/04/18 1425 09/05/18 0344 09/06/18 0250 09/07/18 0303 09/08/18 0719  NA 134* 136 136 134* 135  K 3.5  3.5 3.1* 3.9 3.5  CL 99 101 104 104 105  CO2 22 27 25 24 23   GLUCOSE 255* 126* 124* 126* 129*  BUN 15 13 17 13 12   CREATININE 1.16 1.26* 1.13 0.83 0.95  CALCIUM 8.6* 8.4* 8.1* 8.0* 8.1*  MG  --   --   --   --  2.3   GFR: Estimated Creatinine Clearance: 63.2 mL/min (by C-G formula based on SCr of 0.95 mg/dL). Liver Function Tests: Recent Labs  Lab 09/04/18 1425 09/05/18 0344 09/06/18 0250 09/07/18 0303 09/08/18 0719  AST 249* 133* 65* 34 28  ALT 164* 148* 100* 66* 47*  ALKPHOS 356* 278* 219* 203* 175*  BILITOT 3.7* 2.2* 1.6* 1.2 1.2  PROT 7.0 6.3* 6.1* 5.7* 5.7*  ALBUMIN 3.3* 2.8* 2.6* 2.5* 2.4*   No results for input(s): LIPASE, AMYLASE in the last 168 hours. No results for input(s): AMMONIA in the last 168 hours. Coagulation Profile: No results for input(s): INR, PROTIME in the last 168 hours. Cardiac Enzymes: Recent Labs  Lab 09/04/18 2044  TROPONINI 0.03*   BNP (last 3 results) No results for input(s): PROBNP in the last 8760 hours. HbA1C: No results for input(s): HGBA1C in the last 72 hours. CBG: No results for input(s): GLUCAP in the last 168 hours. Lipid Profile: No results for input(s): CHOL, HDL, LDLCALC, TRIG, CHOLHDL, LDLDIRECT in the last  72 hours. Thyroid Function Tests: No results for input(s): TSH, T4TOTAL, FREET4, T3FREE, THYROIDAB in the last 72 hours. Anemia Panel: No results for input(s): VITAMINB12, FOLATE, FERRITIN, TIBC, IRON, RETICCTPCT in the last 72 hours. Sepsis Labs: Recent Labs  Lab 09/04/18 1449 09/04/18 1642  LATICACIDVEN 3.64* 2.74*    Recent Results (from the past 240 hour(s))  Blood Culture (routine x 2)     Status: Abnormal   Collection Time: 09/04/18  2:25 PM  Result Value Ref Range Status   Specimen Description BLOOD BLOOD RIGHT FOREARM  Final   Special Requests   Final    BOTTLES DRAWN AEROBIC AND ANAEROBIC Blood Culture adequate volume   Culture  Setup Time   Final    GRAM NEGATIVE RODS IN BOTH AEROBIC AND ANAEROBIC  BOTTLES CRITICAL RESULT CALLED TO, READ BACK BY AND VERIFIED WITHDenton Brick [facility] 1610 09/05/18 A BROWNING Performed at [facility], [place] [address] [place], [place] [address]    Culture (A)  Final    KLEBSIELLA PNEUMONIAE Confirmed Extended Spectrum Beta-Lactamase Producer (ESBL).  In bloodstream infections from ESBL organisms, carbapenems are preferred over piperacillin/tazobactam. They are shown to have a lower risk of mortality.    Report Status 09/07/2018 FINAL  Final   Organism ID, Bacteria KLEBSIELLA PNEUMONIAE  Final      Susceptibility   Klebsiella pneumoniae - MIC*    AMPICILLIN >=32 RESISTANT Resistant     CEFAZOLIN >=64 RESISTANT Resistant     CEFEPIME RESISTANT Resistant     CEFTAZIDIME 32 RESISTANT Resistant     CEFTRIAXONE >=64 RESISTANT Resistant     CIPROFLOXACIN >=4 RESISTANT Resistant     GENTAMICIN >=16 RESISTANT Resistant     IMIPENEM <=0.25 SENSITIVE Sensitive     TRIMETH/SULFA >=320 RESISTANT Resistant     AMPICILLIN/SULBACTAM >=32 RESISTANT Resistant     PIP/TAZO 32 INTERMEDIATE Intermediate     Extended ESBL POSITIVE Resistant     * KLEBSIELLA PNEUMONIAE  Blood Culture ID Panel (Reflexed)     Status: Abnormal   Collection Time: 09/04/18  2:25 PM  Result Value Ref Range Status   Enterococcus species NOT DETECTED NOT DETECTED Final   Listeria monocytogenes NOT DETECTED NOT DETECTED Final   Staphylococcus species NOT DETECTED NOT DETECTED Final   Staphylococcus aureus (BCID) NOT DETECTED NOT DETECTED Final   Streptococcus species NOT DETECTED NOT DETECTED Final   Streptococcus agalactiae NOT DETECTED NOT DETECTED Final   Streptococcus pneumoniae NOT DETECTED NOT DETECTED Final   Streptococcus pyogenes NOT DETECTED NOT DETECTED Final   Acinetobacter baumannii NOT DETECTED NOT DETECTED Final   Enterobacteriaceae species DETECTED (A) NOT DETECTED Final    Comment: Enterobacteriaceae represent a large family of gram-negative bacteria, not a single  organism. CRITICAL RESULT CALLED TO, READ BACK BY AND VERIFIED WITH: Denton Brick [facility] [id number] 09/05/18 A BROWNING    Enterobacter cloacae complex NOT DETECTED NOT DETECTED Final   Escherichia coli NOT DETECTED NOT DETECTED Final   Klebsiella oxytoca NOT DETECTED NOT DETECTED Final   Klebsiella pneumoniae DETECTED (A) NOT DETECTED Final    Comment: CRITICAL RESULT CALLED TO, READ BACK BY AND VERIFIED WITH: Denton Brick [facility] 1191 09/05/18 A BROWNING    Proteus species NOT DETECTED NOT DETECTED Final   Serratia marcescens NOT DETECTED NOT DETECTED Final   Carbapenem resistance NOT DETECTED NOT DETECTED Final   Haemophilus influenzae NOT DETECTED NOT DETECTED Final   Neisseria meningitidis NOT DETECTED NOT DETECTED Final   Pseudomonas aeruginosa NOT DETECTED  NOT DETECTED Final   Candida albicans NOT DETECTED NOT DETECTED Final   Candida glabrata NOT DETECTED NOT DETECTED Final   Candida krusei NOT DETECTED NOT DETECTED Final   Candida parapsilosis NOT DETECTED NOT DETECTED Final   Candida tropicalis NOT DETECTED NOT DETECTED Final    Comment: Performed at [facility], [place] [address], [place], [place] [address]  Blood Culture (routine x 2)     Status: Abnormal   Collection Time: 09/04/18  2:30 PM  Result Value Ref Range Status   Specimen Description BLOOD BLOOD LEFT WRIST  Final   Special Requests   Final    BOTTLES DRAWN AEROBIC AND ANAEROBIC Blood Culture results may not be optimal due to an inadequate volume of blood received in culture bottles   Culture  Setup Time   Final    GRAM NEGATIVE RODS AEROBIC BOTTLE ONLY CRITICAL RESULT CALLED TO, READ BACK BY AND VERIFIED WITHDenton Brick [facility] 5631 09/05/18 A BROWNING Performed at [facility], [place] [address], [place], [place] [address]    Culture KLEBSIELLA ORNITHINOLYTICA (A)  Final   Report Status 09/08/2018 FINAL  Final   Organism ID, Bacteria KLEBSIELLA ORNITHINOLYTICA  Final      Susceptibility   Klebsiella  ornithinolytica - MIC*    AMPICILLIN >=32 RESISTANT Resistant     CEFAZOLIN >=64 RESISTANT Resistant     CEFEPIME 4 SENSITIVE Sensitive     CEFTAZIDIME 16 INTERMEDIATE Intermediate     CEFTRIAXONE >=64 RESISTANT Resistant     CIPROFLOXACIN >=4 RESISTANT Resistant     GENTAMICIN >=16 RESISTANT Resistant     IMIPENEM <=0.25 SENSITIVE Sensitive     TRIMETH/SULFA >=320 RESISTANT Resistant     AMPICILLIN/SULBACTAM >=32 RESISTANT Resistant     PIP/TAZO 32 INTERMEDIATE Intermediate     * KLEBSIELLA ORNITHINOLYTICA  Culture, blood (Routine X 2) w Reflex to ID Panel     Status: None (Preliminary result)   Collection Time: 09/06/18  8:12 AM  Result Value Ref Range Status   Specimen Description BLOOD LEFT ANTECUBITAL  Final   Special Requests   Final    BOTTLES DRAWN AEROBIC ONLY Blood Culture adequate volume   Culture   Final    NO GROWTH 3 DAYS Performed at [facility], [address], [place], [place] [address]    Report Status PENDING  Incomplete         Radiology Studies: No results found.      Scheduled Meds: . amiodarone  100 mg Oral Daily  . aspirin  81 mg Oral Daily  . clopidogrel  75 mg Oral Q breakfast  . enoxaparin (LOVENOX) injection  40 mg Subcutaneous Q24H  . ezetimibe-simvastatin  1 tablet Oral QHS  . sodium chloride flush  3 mL Intravenous Q12H   Continuous Infusions: . meropenem (MERREM) IV 1 g (09/09/18 8850)     LOS: 5 days    Time spent: 40 minutes    Irine Seal, MD [facility] Pager [phone]  If 7PM-7AM, please contact night-coverage [email or web address] Password [facility] 09/09/2018, 9:41 AM

## 2018-09-09 NOTE — Progress Notes (Signed)
    [facility] for Infectious Disease    Date of Admission:  09/04/2018   Total days of antibiotics 6        Day 3 meropenem   ID: William [name] is a 84 y.o. male with ESBL kleb pneumonaie, and kleb species ICD related endocarditis Principal Problem:   Sepsis ([place]) Active Problems:   Nonischemic cardiomyopathy ([place])   Hyperlipidemia   S/P ICD (internal cardiac defibrillator) procedure, 01/11/14 removal of ERI gen and placement of Medtronic Evera XT VR & NEW Right ventricular lead Medtronic   Ventricular tachycardia (HCC)   S/P TAVR (transcatheter aortic valve replacement)   Bacteremia due to Klebsiella pneumoniae    Subjective: afebrile  Medications:  . amiodarone  100 mg Oral Daily  . aspirin  81 mg Oral Daily  . clopidogrel  75 mg Oral Q breakfast  . enoxaparin (LOVENOX) injection  40 mg Subcutaneous Q24H  . ezetimibe-simvastatin  1 tablet Oral QHS  . sodium chloride flush  3 mL Intravenous Q12H    Objective: Vital signs in last 24 hours: Temp:  [97.2 F (36.2 C)-98.7 F (37.1 C)] 97.7 F (36.5 C) (11/27 1310) Pulse Rate:  [62-107] 107 (11/27 1310) Resp:  [13-21] 20 (11/27 1310) BP: (101-109)/(46-68) 101/68 (11/27 1310) SpO2:  [97 %-100 %] 100 % (11/27 0327) Weight:  [98.3 kg] 98.3 kg (11/27 0402) Physical Exam  Constitutional: He is oriented to person, place, and time. He appears well-developed and well-nourished. No distress.  HENT:  Mouth/Throat: Oropharynx is clear and moist. No oropharyngeal exudate.  chestwall = no redness to pacemaker pocket Cardiovascular: Normal rate, regular rhythm and normal heart sounds.  No murmur heard.  Pulmonary/Chest: Effort normal and breath sounds normal. No respiratory distress. He has no wheezes.  Abdominal: Soft. Bowel sounds are normal. He exhibits no distension. There is no tenderness.  Lymphadenopathy:  He has no cervical adenopathy.  Neurological: He is alert and oriented to person, place, and time.  Skin:  Skin is warm and dry. No rash noted. No erythema.  Psychiatric: He has a normal mood and affect. His behavior is normal.     Lab Results Recent Labs    09/08/18 0719 09/09/18 0826  WBC 3.8* 4.2  HGB 11.0* 11.4*  HCT 31.9* 33.0*  NA 135 132*  K 3.5 3.7  CL 105 103  CO2 23 21*  BUN 12 13  CREATININE 0.95 0.92   Liver Panel Recent Labs    09/08/18 0719 09/09/18 0826  PROT 5.7* 5.6*  ALBUMIN 2.4* 2.5*  AST 28 26  ALT 47* 42  ALKPHOS 175* 155*  BILITOT 1.2 1.4*   S Microbiology: 11/24 blood cx ngtd Studies/Results: No results found.   Assessment/Plan: ESBL kleb pneumonia cardiac device infection = TEE found possible vegetation on one of the leads. Spoke with dr Lovena Le who can do lead extraction on Tuesday. Until then, continue on meropenem - this he will need for 6 wk starting over after lead extraction. Place picc line after lead extraction.  If patient were to have fever, please repeat peripheral blood cx x 2.  Dr Lucianne Lei dam to follow up over the next few days.    [facility] for Infectious Diseases Cell: [phone] Pager: [phone]  09/09/2018, 3:23 PM

## 2018-09-10 DIAGNOSIS — I330 Acute and subacute infective endocarditis: Secondary | ICD-10-CM

## 2018-09-10 DIAGNOSIS — T827XXD Infection and inflammatory reaction due to other cardiac and vascular devices, implants and grafts, subsequent encounter: Secondary | ICD-10-CM

## 2018-09-10 DIAGNOSIS — T827XXA Infection and inflammatory reaction due to other cardiac and vascular devices, implants and grafts, initial encounter: Secondary | ICD-10-CM

## 2018-09-10 DIAGNOSIS — A419 Sepsis, unspecified organism: Secondary | ICD-10-CM

## 2018-09-10 LAB — CBC WITH DIFFERENTIAL/PLATELET
Abs Immature Granulocytes: 0.02 10*3/uL (ref 0.00–0.07)
BASOS ABS: 0 10*3/uL (ref 0.0–0.1)
Basophils Relative: 1 %
EOS PCT: 10 %
Eosinophils Absolute: 0.4 10*3/uL (ref 0.0–0.5)
HEMATOCRIT: 32.2 % — AB (ref 39.0–52.0)
HEMOGLOBIN: 11.1 g/dL — AB (ref 13.0–17.0)
Immature Granulocytes: 1 %
LYMPHS ABS: 1 10*3/uL (ref 0.7–4.0)
LYMPHS PCT: 23 %
MCH: 33.3 pg (ref 26.0–34.0)
MCHC: 34.5 g/dL (ref 30.0–36.0)
MCV: 96.7 fL (ref 80.0–100.0)
Monocytes Absolute: 0.4 10*3/uL (ref 0.1–1.0)
Monocytes Relative: 10 %
NRBC: 0 % (ref 0.0–0.2)
Neutro Abs: 2.4 10*3/uL (ref 1.7–7.7)
Neutrophils Relative %: 55 %
Platelets: 108 10*3/uL — ABNORMAL LOW (ref 150–400)
RBC: 3.33 MIL/uL — ABNORMAL LOW (ref 4.22–5.81)
RDW: 15.7 % — ABNORMAL HIGH (ref 11.5–15.5)
WBC: 4.2 10*3/uL (ref 4.0–10.5)

## 2018-09-10 LAB — BASIC METABOLIC PANEL
ANION GAP: 10 (ref 5–15)
BUN: 12 mg/dL (ref 8–23)
CHLORIDE: 103 mmol/L (ref 98–111)
CO2: 21 mmol/L — AB (ref 22–32)
Calcium: 8.3 mg/dL — ABNORMAL LOW (ref 8.9–10.3)
Creatinine, Ser: 0.92 mg/dL (ref 0.61–1.24)
GFR calc non Af Amer: >60 mL/min (ref >60)
Glucose, Bld: 115 mg/dL — ABNORMAL HIGH (ref 70–99)
Potassium: 3.6 mmol/L (ref 3.5–5.1)
Sodium: 134 mmol/L — ABNORMAL LOW (ref 135–145)

## 2018-09-10 NOTE — Progress Notes (Signed)
Pharmacy Antibiotic Note  William [name] is a 84 y.o. 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 Plans noted for ICD extraction on 12/3 with plans for 6 weeks antibiotics from that time. -WBC= 4.2, afeb, CrCl ~ 65   Plan: -Continue Merrem 1gm IV Q8H -Will follow renal function, cultures and clinical progress    Height: 5\' 10"  (177.8 cm) Weight: 219 lb 2.2 oz (99.4 kg) IBW/kg (Calculated) : 73  Temp (24hrs), Avg:98.3 F (36.8 C), Min:97.7 F (36.5 C), Max:99 F (37.2 C)  Recent Labs  Lab 09/04/18 1449 09/04/18 1642 09/05/18 0344 09/06/18 0250 09/07/18 0303 09/08/18 0719 09/09/18 0826 09/10/18 0317  WBC  --   --  8.1  --  4.4 3.8* 4.2 4.2  CREATININE  --   --  1.26* 1.13 0.83 0.95 0.92 0.92  LATICACIDVEN 3.64* 2.74*  --   --   --   --   --   --     Estimated Creatinine Clearance: 65.6 mL/min (by C-G formula based on SCr of 0.92 mg/dL).    Allergies  Allergen Reactions  . Crestor [Rosuvastatin] Other (See Comments)    Hurts muscles  . Lipitor [Atorvastatin] Other (See Comments)    Hurts stomach     Cefepime 11/22 x1 CTX 11/23 >> 11/25 Vanc 11/22 >> 11/23 Flagyl 11/22 >> 11/23 Merrem 11/25 >>  11/22 BCx - ESBL Kleb pneumo/ornithinolytica (BCID Kleb pneumo)  11/23 Flu A/B PCR - negative 11/24 BCx - NGTD   Hildred Laser, PharmD Clinical Pharmacist **Pharmacist phone directory can now be found on [email or web address] ([email or web address]).  Listed under [place].

## 2018-09-10 NOTE — Progress Notes (Signed)
Progress Note  Patient Name: William [name] Date of Encounter: 09/10/2018  Primary Cardiologist:   MCr Primary Electrophysiologist: GT   Patient Profile     84 y.o. male seen for evaluation of bacteremia with possible vegetation on ICD Lead  Hx of AAA, NICM, chronic CHF (systolic), VT  w/ICDreimplanted 2015 and abandoned [place] lead from 2005   Also on amiodarone , OSA (not on CPAP), arthritis, CBP/lumbar vertebral fracture, prostate ca w/hx of recurrent UTIs/incontinance, and severe AS recently s/p TAVR  10/19 sepsis 2/2 Klebsiella  TEE >> Severe LV dysfunction; severe LAE; s/p AVR; trace AI; moderate (3+) MR; TV not well visualized; oscillating density on ICD lead concerning for vegetation. BC + for Klebsiella spieces x 2 Extraction is planned for next week   Subjective   Without complaints   No chest pain or SOB  Lying in bed   Inpatient Medications    Scheduled Meds: . amiodarone  100 mg Oral Daily  . aspirin  81 mg Oral Daily  . clopidogrel  75 mg Oral Q breakfast  . enoxaparin (LOVENOX) injection  40 mg Subcutaneous Q24H  . ezetimibe-simvastatin  1 tablet Oral QHS  . sodium chloride flush  3 mL Intravenous Q12H   Continuous Infusions: . meropenem (MERREM) IV 1 g (09/10/18 0519)   PRN Meds: acetaminophen, polyethylene glycol   Vital Signs    Vitals:   09/09/18 2039 09/09/18 2327 09/10/18 0520 09/10/18 0844  BP: 108/67 112/64 97/64 102/61  Pulse:    96  Resp: (!) 21 19  (!) 23  Temp: 97.7 F (36.5 C) 98.3 F (36.8 C) 98.6 F (37 C) 99 F (37.2 C)  TempSrc: Oral Oral Oral Oral  SpO2: 98% 96% 96%   Weight:   99.4 kg   Height:        Intake/Output Summary (Last 24 hours) at 09/10/2018 0849 Last data filed at 09/10/2018 0528 Gross per 24 hour  Intake 848.43 ml  Output 3551 ml  Net -2702.57 ml   Filed Weights   09/08/18 0511 09/09/18 0402 09/10/18 0520  Weight: 98.3 kg 98.3 kg 99.4 kg    Telemetry    Sinus with freq ventricular ectopy* -  Personally Reviewed  ECG    na - Personally Reviewed  Physical Exam    GEN: No acute distress.   Neck: JVD flat Cardiac: RRR, 2/6 murmur, rubs, or gallops.  Respiratory: Clear to auscultation bilaterally. GI: Soft, nontender, non-distended  MS:  no edema; No deformity. Neuro:  Nonfocal  Psych: Normal affect  Alert and O x 1  Skin Warm and dry   Labs    Chemistry Recent Labs  Lab 09/07/18 0303 09/08/18 0719 09/09/18 0826 09/10/18 0317  NA 134* 135 132* 134*  K 3.9 3.5 3.7 3.6  CL 104 105 103 103  CO2 24 23 21* 21*  GLUCOSE 126* 129* 126* 115*  BUN 13 12 13 12   CREATININE 0.83 0.95 0.92 0.92  CALCIUM 8.0* 8.1* 8.2* 8.3*  PROT 5.7* 5.7* 5.6*  --   ALBUMIN 2.5* 2.4* 2.5*  --   AST 34 28 26  --   ALT 66* 47* 42  --   ALKPHOS 203* 175* 155*  --   BILITOT 1.2 1.2 1.4*  --   GFRNONAA >60 >60 >60 >60  GFRAA >60 >60 >60 >60  ANIONGAP 6 7 8 10      Hematology Recent Labs  Lab 09/08/18 0719 09/09/18 0826 09/10/18 0317  WBC 3.8* 4.2 4.2  RBC 3.31* 3.39* 3.33*  HGB 11.0* 11.4* 11.1*  HCT 31.9* 33.0* 32.2*  MCV 96.4 97.3 96.7  MCH 33.2 33.6 33.3  MCHC 34.5 34.5 34.5  RDW 15.7* 15.8* 15.7*  PLT 97* 106* 108*    Cardiac Enzymes Recent Labs  Lab 09/04/18 2044  TROPONINI 0.03*   No results for input(s): TROPIPOC in the last 168 hours.   BNPNo results for input(s): BNP, PROBNP in the last 168 hours.   DDimer No results for input(s): DDIMER in the last 168 hours.   Radiology    No results found.  Cardiac Studies   TEE as above  Device Interrogation       Assessment & Plan    Klebsiella bacteremia  CIED infection  ICD with abandoned lead  TAVR recent  NICM  Dementia    Pt on antibiotics with planned extraction next week  Have tried to get at literature today to ascertain possibility of Klebsiella eradication with ABx alone or on suppressive therapy.  The morbidity of extraction in this 84 yo man w dementia is not likely trivial  Will  followup over the next few days with ID and GT     Signed, Virl Axe, MD  09/10/2018, 8:49 AM

## 2018-09-10 NOTE — Progress Notes (Signed)
Rehab Admissions Coordinator Note:  Patient was screened by Cleatrice Burke for appropriateness for an Inpatient Acute Rehab Consult per OT recommendation.  At this time, we are recommending Inpatient Rehab consult if pt would like to be considered for admit. Please advise.  Cleatrice Burke 09/10/2018, 8:58 AM  I can be reached at [phone].

## 2018-09-10 NOTE — Progress Notes (Signed)
PROGRESS NOTE    William [name]  QHU:[id number] DOB: June 13, 1928 DOA: 09/04/2018 PCP: Haywood Pao, MD    Brief Narrative:  84 y.o.malewith a hx of Chronic Systolic CHF (EF 46-56%), Severe AoS s/p TAVR 08/18/18, ventricular tachycardia s/p ICD,AAA,hyperlipidemia, prostate cancer, recurrent UTI, and degenerative arthritis who presentedto the hospital with sudden onset of fevers, chills, and fatigue.  Patient was recently admitted from 08/07/2018-08/13/2018 for septic shock secondary to Klebsiella UTI. He was admitted again from 08/18/2018-08/20/2018 for successful TAVR procedure.  In the ED he was found to have a pulse of 120, RR 26, temp 101.81F. Urinalysis was negative. CXR showed a right basilar opacity suggestive of atelectasis versus airspace disease/pneumonia. CT abdomen/pelvis with contrast showed uncomplicated cholelithiasis, 4.4 cm infrarenal AAA, bilateral simple and complex renal cyst, and colonic diverticulosis without acute diverticulitis.   Assessment & Plan:   Principal Problem:   Sepsis ([place]) Active Problems:   Nonischemic cardiomyopathy (HCC)   Hyperlipidemia   S/P ICD (internal cardiac defibrillator) procedure, 01/11/14 removal of ERI gen and placement of Medtronic Evera XT VR & NEW Right ventricular lead Medtronic   Ventricular tachycardia (HCC)   S/P TAVR (transcatheter aortic valve replacement)   Bacteremia due to Klebsiella pneumoniae  1 ESBL Klebsiella bacteremia/pacemaker lead vegetation/endocarditis Patient presented with fevers and chills blood cultures positive for ESBL Klebsiella bacteremia.  Patient underwent 2D echo which showed a EF of 35 to 40%, diffuse hypokinesis, akinesis of the inferior septal myocardium, TAVR bioprosthesis present with no evidence of vegetation.  Patient status post TEE on 09/08/2018 concerning for vegetation to lead.  Patient seen in consultation by ID who are recommending 6 weeks of IV meropenem.  Repeat blood cultures  done 09/06/2018 with no growth to date.  Patient seen by cardiology, cardiothoracic surgery as well as the EP team and patient follow device extraction on Tuesday, 09/15/2018.  Per ID continue IV antibiotics until lead extraction and then will need a total of 6 weeks of IV antibiotics post lead extraction.  PICC line to be placed post lead extraction.  Follow.  2.  Severe aortic stenosis status post TAVR 08/18/2018 Valve team following.  3.  Hypotension Patient noted to have systolic blood pressure usually in the 100s which seems stable.  IV fluids have been saline locked.    4.  Chronic systolic heart failure/nonischemic cardiomyopathy Patient noted per 2D echo of 09/04/2018 12 a EF of 35 to 40%.  Patient does not clinically look decompensated.  Current weight is 99.4 kg from 98.3 kg from 98.3 kg from 97.9 kg from 100.4 kg.  Monitor volume status closely.  Saline lock IV fluids.  Patient with a urine output of 3.550 L over the past 24 hours.  Per cardiology.  5.  Hyperlipidemia Continue Vytorin.  6.  Infrarenal AAA Patient with a 4.4 cm infrarenal fusiform AAA seen on CT abdomen and pelvis.  Followed by T CTS.  7.  History of ventricular tachycardia status post ICD Continue amiodarone.  Patient has been evaluated by EP and patient for device extraction early next week 09/15/2018. Per EP.  8.  Anemia Patient with no overt bleeding.  Hemoglobin currently stable at 11.1.  Follow H&H.  Transfusion threshold hemoglobin less than 7.   DVT prophylaxis: Lovenox Code Status: Full Family Communication: Updated patient and grandson and grand daughter at bedside. Disposition Plan: Per cardiology.   Consultants:   Cardiothoracic surgery: Dr. Alvan Dame 09/04/2018  Infectious disease: Dr. Johnnye Sima 09/05/2018  Cardiology/valve team Dr. Angelena Form 09/08/2018  Cardiology: Dr. Debara Pickett 09/06/2018  Electrophysiology: Dr. Lovena Le 09/09/2018  Procedures:   CT abdomen and pelvis 09/04/2018  Chest x-ray  09/04/2018  2D echo 09/04/2018  Transesophageal echocardiogram 09/08/2018--- severe global reduction in LV systolic function: Status post AVR with trace periprosthetic AI, moderate MR,severe LAE, oscillating density on pacer wire concerning for vegetation.--- Per Dr. Stanford Breed  Antimicrobials:   IV cefepime 09/04/2018>>>> 09/05/2018  IV Rocephin 09/05/2018>>>>> 09/07/2018  IV Merrem 09/07/2018  IV vancomycin 09/04/2018>>>>> 09/05/2018   Subjective: Sitting up in bed.  Grandchildren at bedside.  Denies any chest pain no shortness of breath.   Objective: Vitals:   09/09/18 2039 09/09/18 2327 09/10/18 0520 09/10/18 0844  BP: 108/67 112/64 97/64 102/61  Pulse:    96  Resp: (!) 21 19  (!) 23  Temp: 97.7 F (36.5 C) 98.3 F (36.8 C) 98.6 F (37 C) 99 F (37.2 C)  TempSrc: Oral Oral Oral Oral  SpO2: 98% 96% 96% 90%  Weight:   99.4 kg   Height:        Intake/Output Summary (Last 24 hours) at 09/10/2018 0949 Last data filed at 09/10/2018 0528 Gross per 24 hour  Intake 848.43 ml  Output 3551 ml  Net -2702.57 ml   Filed Weights   09/08/18 0511 09/09/18 0402 09/10/18 0520  Weight: 98.3 kg 98.3 kg 99.4 kg    Examination:  General exam: Appears calm and comfortable  Respiratory system: Lungs clear to auscultation bilaterally.  No wheezes, no crackles, no rhonchi.   Respiratory effort normal. Cardiovascular system: S1 & S2 heard, RRR. No JVD, murmurs, rubs, gallops or clicks.  Trace to 1+ bilateral lower extremity edema.  Gastrointestinal system: Abdomen is soft, nontender, nondistended, positive bowel sounds.  No rebound.  No guarding.  Central nervous system: Alert and oriented. No focal neurological deficits. Extremities: Trace to 1+ bilateral lower extremity edema.  Symmetric 5 x 5 power. Skin: No rashes, lesions or ulcers Psychiatry: Judgement and insight appear normal. Mood & affect appropriate.     Data Reviewed: I have personally reviewed following labs and  imaging studies  CBC: Recent Labs  Lab 09/04/18 1425 09/05/18 0344 09/07/18 0303 09/08/18 0719 09/09/18 0826 09/10/18 0317  WBC 6.4 8.1 4.4 3.8* 4.2 4.2  NEUTROABS 5.6  --   --   --  2.7 2.4  HGB 13.0 11.1* 10.4* 11.0* 11.4* 11.1*  HCT 36.9* 31.7* 31.6* 31.9* 33.0* 32.2*  MCV 95.3 96.6 97.8 96.4 97.3 96.7  PLT 108* 89* 90* 97* 106* 732*   Basic Metabolic Panel: Recent Labs  Lab 09/06/18 0250 09/07/18 0303 09/08/18 0719 09/09/18 0826 09/10/18 0317  NA 136 134* 135 132* 134*  K 3.1* 3.9 3.5 3.7 3.6  CL 104 104 105 103 103  CO2 25 24 23  21* 21*  GLUCOSE 124* 126* 129* 126* 115*  BUN 17 13 12 13 12   CREATININE 1.13 0.83 0.95 0.92 0.92  CALCIUM 8.1* 8.0* 8.1* 8.2* 8.3*  MG  --   --  2.3 2.2  --    GFR: Estimated Creatinine Clearance: 65.6 mL/min (by C-G formula based on SCr of 0.92 mg/dL). Liver Function Tests: Recent Labs  Lab 09/05/18 0344 09/06/18 0250 09/07/18 0303 09/08/18 0719 09/09/18 0826  AST 133* 65* 34 28 26  ALT 148* 100* 66* 47* 42  ALKPHOS 278* 219* 203* 175* 155*  BILITOT 2.2* 1.6* 1.2 1.2 1.4*  PROT 6.3* 6.1* 5.7* 5.7* 5.6*  ALBUMIN 2.8* 2.6* 2.5* 2.4* 2.5*   No results for input(s):  LIPASE, AMYLASE in the last 168 hours. No results for input(s): AMMONIA in the last 168 hours. Coagulation Profile: No results for input(s): INR, PROTIME in the last 168 hours. Cardiac Enzymes: Recent Labs  Lab 09/04/18 2044  TROPONINI 0.03*   BNP (last 3 results) No results for input(s): PROBNP in the last 8760 hours. HbA1C: No results for input(s): HGBA1C in the last 72 hours. CBG: No results for input(s): GLUCAP in the last 168 hours. Lipid Profile: No results for input(s): CHOL, HDL, LDLCALC, TRIG, CHOLHDL, LDLDIRECT in the last 72 hours. Thyroid Function Tests: No results for input(s): TSH, T4TOTAL, FREET4, T3FREE, THYROIDAB in the last 72 hours. Anemia Panel: No results for input(s): VITAMINB12, FOLATE, FERRITIN, TIBC, IRON, RETICCTPCT in the last  72 hours. Sepsis Labs: Recent Labs  Lab 09/04/18 1449 09/04/18 1642  LATICACIDVEN 3.64* 2.74*    Recent Results (from the past 240 hour(s))  Blood Culture (routine x 2)     Status: Abnormal   Collection Time: 09/04/18  2:25 PM  Result Value Ref Range Status   Specimen Description BLOOD BLOOD RIGHT FOREARM  Final   Special Requests   Final    BOTTLES DRAWN AEROBIC AND ANAEROBIC Blood Culture adequate volume   Culture  Setup Time   Final    GRAM NEGATIVE RODS IN BOTH AEROBIC AND ANAEROBIC BOTTLES CRITICAL RESULT CALLED TO, READ BACK BY AND VERIFIED WITHDenton Brick [facility] 6503 09/05/18 A BROWNING Performed at [facility], [place] [address], [place], [place] [address]    Culture (A)  Final    KLEBSIELLA PNEUMONIAE Confirmed Extended Spectrum Beta-Lactamase Producer (ESBL).  In bloodstream infections from ESBL organisms, carbapenems are preferred over piperacillin/tazobactam. They are shown to have a lower risk of mortality.    Report Status 09/07/2018 FINAL  Final   Organism ID, Bacteria KLEBSIELLA PNEUMONIAE  Final      Susceptibility   Klebsiella pneumoniae - MIC*    AMPICILLIN >=32 RESISTANT Resistant     CEFAZOLIN >=64 RESISTANT Resistant     CEFEPIME RESISTANT Resistant     CEFTAZIDIME 32 RESISTANT Resistant     CEFTRIAXONE >=64 RESISTANT Resistant     CIPROFLOXACIN >=4 RESISTANT Resistant     GENTAMICIN >=16 RESISTANT Resistant     IMIPENEM <=0.25 SENSITIVE Sensitive     TRIMETH/SULFA >=320 RESISTANT Resistant     AMPICILLIN/SULBACTAM >=32 RESISTANT Resistant     PIP/TAZO 32 INTERMEDIATE Intermediate     Extended ESBL POSITIVE Resistant     * KLEBSIELLA PNEUMONIAE  Blood Culture ID Panel (Reflexed)     Status: Abnormal   Collection Time: 09/04/18  2:25 PM  Result Value Ref Range Status   Enterococcus species NOT DETECTED NOT DETECTED Final   Listeria monocytogenes NOT DETECTED NOT DETECTED Final   Staphylococcus species NOT DETECTED NOT DETECTED Final    Staphylococcus aureus (BCID) NOT DETECTED NOT DETECTED Final   Streptococcus species NOT DETECTED NOT DETECTED Final   Streptococcus agalactiae NOT DETECTED NOT DETECTED Final   Streptococcus pneumoniae NOT DETECTED NOT DETECTED Final   Streptococcus pyogenes NOT DETECTED NOT DETECTED Final   Acinetobacter baumannii NOT DETECTED NOT DETECTED Final   Enterobacteriaceae species DETECTED (A) NOT DETECTED Final    Comment: Enterobacteriaceae represent a large family of gram-negative bacteria, not a single organism. CRITICAL RESULT CALLED TO, READ BACK BY AND VERIFIED WITH: Denton Brick [facility] [id number] 09/05/18 A BROWNING    Enterobacter cloacae complex NOT DETECTED NOT DETECTED Final   Escherichia coli NOT DETECTED  NOT DETECTED Final   Klebsiella oxytoca NOT DETECTED NOT DETECTED Final   Klebsiella pneumoniae DETECTED (A) NOT DETECTED Final    Comment: CRITICAL RESULT CALLED TO, READ BACK BY AND VERIFIED WITH: Denton Brick PHARMD 1610 09/05/18 A BROWNING    Proteus species NOT DETECTED NOT DETECTED Final   Serratia marcescens NOT DETECTED NOT DETECTED Final   Carbapenem resistance NOT DETECTED NOT DETECTED Final   Haemophilus influenzae NOT DETECTED NOT DETECTED Final   Neisseria meningitidis NOT DETECTED NOT DETECTED Final   Pseudomonas aeruginosa NOT DETECTED NOT DETECTED Final   Candida albicans NOT DETECTED NOT DETECTED Final   Candida glabrata NOT DETECTED NOT DETECTED Final   Candida krusei NOT DETECTED NOT DETECTED Final   Candida parapsilosis NOT DETECTED NOT DETECTED Final   Candida tropicalis NOT DETECTED NOT DETECTED Final    Comment: Performed at [facility], [address] [place], [place] [address]  Blood Culture (routine x 2)     Status: Abnormal   Collection Time: 09/04/18  2:30 PM  Result Value Ref Range Status   Specimen Description BLOOD BLOOD LEFT WRIST  Final   Special Requests   Final    BOTTLES DRAWN AEROBIC AND ANAEROBIC Blood Culture results may not be optimal due  to an inadequate volume of blood received in culture bottles   Culture  Setup Time   Final    GRAM NEGATIVE RODS AEROBIC BOTTLE ONLY CRITICAL RESULT CALLED TO, READ BACK BY AND VERIFIED WITHDenton Brick [facility] 4098 09/05/18 A BROWNING Performed at [facility], [place] [address], [place], [place] [address]    Culture KLEBSIELLA ORNITHINOLYTICA (A)  Final   Report Status 09/08/2018 FINAL  Final   Organism ID, Bacteria KLEBSIELLA ORNITHINOLYTICA  Final      Susceptibility   Klebsiella ornithinolytica - MIC*    AMPICILLIN >=32 RESISTANT Resistant     CEFAZOLIN >=64 RESISTANT Resistant     CEFEPIME 4 SENSITIVE Sensitive     CEFTAZIDIME 16 INTERMEDIATE Intermediate     CEFTRIAXONE >=64 RESISTANT Resistant     CIPROFLOXACIN >=4 RESISTANT Resistant     GENTAMICIN >=16 RESISTANT Resistant     IMIPENEM <=0.25 SENSITIVE Sensitive     TRIMETH/SULFA >=320 RESISTANT Resistant     AMPICILLIN/SULBACTAM >=32 RESISTANT Resistant     PIP/TAZO 32 INTERMEDIATE Intermediate     * KLEBSIELLA ORNITHINOLYTICA  Culture, blood (Routine X 2) w Reflex to ID Panel     Status: None (Preliminary result)   Collection Time: 09/06/18  8:12 AM  Result Value Ref Range Status   Specimen Description BLOOD LEFT ANTECUBITAL  Final   Special Requests   Final    BOTTLES DRAWN AEROBIC ONLY Blood Culture adequate volume   Culture   Final    NO GROWTH 3 DAYS Performed at [facility], [address] [place], [place] [address]    Report Status PENDING  Incomplete         Radiology Studies: No results found.      Scheduled Meds: . amiodarone  100 mg Oral Daily  . aspirin  81 mg Oral Daily  . clopidogrel  75 mg Oral Q breakfast  . enoxaparin (LOVENOX) injection  40 mg Subcutaneous Q24H  . ezetimibe-simvastatin  1 tablet Oral QHS  . sodium chloride flush  3 mL Intravenous Q12H   Continuous Infusions: . meropenem (MERREM) IV 1 g (09/10/18 0519)     LOS: 6 days    Time spent: 40  minutes    Irine Seal, MD [facility] Pager [phone]  If 7PM-7AM, please contact night-coverage [email or web address] Password [facility] 09/10/2018, 9:49 AM

## 2018-09-10 NOTE — Plan of Care (Signed)
  Problem: Fluid Volume: Goal: Hemodynamic stability will improve Outcome: Progressing   Problem: Clinical Measurements: Goal: Diagnostic test results will improve Outcome: Progressing Goal: Signs and symptoms of infection will decrease Outcome: Progressing   Problem: Education: Goal: Knowledge of General Education information will improve Description Including pain rating scale, medication(s)/side effects and non-pharmacologic comfort measures Outcome: Progressing   Problem: Health Behavior/Discharge Planning: Goal: Ability to manage health-related needs will improve Outcome: Progressing   Problem: Clinical Measurements: Goal: Ability to maintain clinical measurements within normal limits will improve Outcome: Progressing Goal: Will remain free from infection Outcome: Progressing Goal: Diagnostic test results will improve Outcome: Progressing Goal: Respiratory complications will improve Outcome: Progressing Goal: Cardiovascular complication will be avoided Outcome: Progressing   Problem: Activity: Goal: Risk for activity intolerance will decrease Outcome: Progressing   Problem: Nutrition: Goal: Adequate nutrition will be maintained Outcome: Progressing   Problem: Coping: Goal: Level of anxiety will decrease Outcome: Progressing   Problem: Elimination: Goal: Will not experience complications related to bowel motility Outcome: Progressing Goal: Will not experience complications related to urinary retention Outcome: Progressing   Problem: Skin Integrity: Goal: Risk for impaired skin integrity will decrease Outcome: Progressing

## 2018-09-11 DIAGNOSIS — R5381 Other malaise: Secondary | ICD-10-CM

## 2018-09-11 LAB — BASIC METABOLIC PANEL
ANION GAP: 7 (ref 5–15)
BUN: 13 mg/dL (ref 8–23)
CHLORIDE: 104 mmol/L (ref 98–111)
CO2: 22 mmol/L (ref 22–32)
Calcium: 8.5 mg/dL — ABNORMAL LOW (ref 8.9–10.3)
Creatinine, Ser: 0.98 mg/dL (ref 0.61–1.24)
GFR calc non Af Amer: >60 mL/min (ref >60)
Glucose, Bld: 130 mg/dL — ABNORMAL HIGH (ref 70–99)
POTASSIUM: 3.8 mmol/L (ref 3.5–5.1)
Sodium: 133 mmol/L — ABNORMAL LOW (ref 135–145)

## 2018-09-11 LAB — CBC WITH DIFFERENTIAL/PLATELET
Abs Immature Granulocytes: 0.03 10*3/uL (ref 0.00–0.07)
BASOS ABS: 0 10*3/uL (ref 0.0–0.1)
Basophils Relative: 1 %
EOS PCT: 9 %
Eosinophils Absolute: 0.5 10*3/uL (ref 0.0–0.5)
HCT: 32.9 % — ABNORMAL LOW (ref 39.0–52.0)
Hemoglobin: 11.1 g/dL — ABNORMAL LOW (ref 13.0–17.0)
Immature Granulocytes: 1 %
LYMPHS PCT: 20 %
Lymphs Abs: 1 10*3/uL (ref 0.7–4.0)
MCH: 32.6 pg (ref 26.0–34.0)
MCHC: 33.7 g/dL (ref 30.0–36.0)
MCV: 96.5 fL (ref 80.0–100.0)
Monocytes Absolute: 0.5 10*3/uL (ref 0.1–1.0)
Monocytes Relative: 9 %
NRBC: 0 % (ref 0.0–0.2)
Neutro Abs: 3.1 10*3/uL (ref 1.7–7.7)
Neutrophils Relative %: 60 %
Platelets: 111 10*3/uL — ABNORMAL LOW (ref 150–400)
RBC: 3.41 MIL/uL — AB (ref 4.22–5.81)
RDW: 15.6 % — AB (ref 11.5–15.5)
WBC: 5.1 10*3/uL (ref 4.0–10.5)

## 2018-09-11 LAB — CULTURE, BLOOD (ROUTINE X 2)
Culture: NO GROWTH
SPECIAL REQUESTS: ADEQUATE

## 2018-09-11 NOTE — Progress Notes (Signed)
Progress Note  Patient Name: William [name] Date of Encounter: 09/11/2018  Primary Cardiologist: Sanda Klein, MD   Subjective   No complaints, wife is at bedside.    Inpatient Medications    Scheduled Meds: . amiodarone  100 mg Oral Daily  . aspirin  81 mg Oral Daily  . clopidogrel  75 mg Oral Q breakfast  . enoxaparin (LOVENOX) injection  40 mg Subcutaneous Q24H  . ezetimibe-simvastatin  1 tablet Oral QHS  . sodium chloride flush  3 mL Intravenous Q12H   Continuous Infusions: . meropenem (MERREM) IV 1 g (09/11/18 0625)   PRN Meds: acetaminophen, polyethylene glycol   Vital Signs    Vitals:   09/10/18 1702 09/10/18 2035 09/10/18 2359 09/11/18 0441  BP: 101/67 106/63 106/80 110/70  Pulse:   97 96  Resp: (!) 24 (!) 21 (!) 24 12  Temp: 98.2 F (36.8 C) 97.9 F (36.6 C) 97.9 F (36.6 C) 98.1 F (36.7 C)  TempSrc: Oral Oral Oral Oral  SpO2: 95% 98% 94% 98%  Weight:      Height:        Intake/Output Summary (Last 24 hours) at 09/11/2018 0848 Last data filed at 09/11/2018 0445 Gross per 24 hour  Intake 417.16 ml  Output 525 ml  Net -107.84 ml   Filed Weights   09/08/18 0511 09/09/18 0402 09/10/18 0520  Weight: 98.3 kg 98.3 kg 99.4 kg    Telemetry    SR, infrequent PVCs - Personally Reviewed  ECG    No new EKGs - Personally Reviewed  Physical Exam   GEN: No acute distress.   Neck: No JVD Cardiac: RRR, no murmurs, rubs, or gallops.  Respiratory: CTA b/l. GI: Soft, nontender, non-distended  MS: No edema; No deformity. Neuro:  Nonfocal, he is oriented to self, knows his wife at bedside, tells me he is at the college, though able to be easily re-oriented Psych: Normal affect   Labs    Chemistry Recent Labs  Lab 09/07/18 0303 09/08/18 0719 09/09/18 0826 09/10/18 0317 09/11/18 0356  NA 134* 135 132* 134* 133*  K 3.9 3.5 3.7 3.6 3.8  CL 104 105 103 103 104  CO2 24 23 21* 21* 22  GLUCOSE 126* 129* 126* 115* 130*  BUN 13 12 13 12 13    CREATININE 0.83 0.95 0.92 0.92 0.98  CALCIUM 8.0* 8.1* 8.2* 8.3* 8.5*  PROT 5.7* 5.7* 5.6*  --   --   ALBUMIN 2.5* 2.4* 2.5*  --   --   AST 34 28 26  --   --   ALT 66* 47* 42  --   --   ALKPHOS 203* 175* 155*  --   --   BILITOT 1.2 1.2 1.4*  --   --   GFRNONAA >60 >60 >60 >60 >60  GFRAA >60 >60 >60 >60 >60  ANIONGAP 6 7 8 10 7      Hematology Recent Labs  Lab 09/09/18 0826 09/10/18 0317 09/11/18 0356  WBC 4.2 4.2 5.1  RBC 3.39* 3.33* 3.41*  HGB 11.4* 11.1* 11.1*  HCT 33.0* 32.2* 32.9*  MCV 97.3 96.7 96.5  MCH 33.6 33.3 32.6  MCHC 34.5 34.5 33.7  RDW 15.8* 15.7* 15.6*  PLT 106* 108* 111*    Cardiac Enzymes Recent Labs  Lab 09/04/18 2044  TROPONINI 0.03*   No results for input(s): TROPIPOC in the last 168 hours.   BNPNo results for input(s): BNP, PROBNP in the last 168 hours.   DDimer  No results for input(s): DDIMER in the last 168 hours.   Radiology    No results found.  Cardiac Studies   09/04/18; TTE Study Conclusions - Left ventricle: The cavity size was normal. Wall thickness was increased in a pattern of mild LVH. Systolic function was moderately reduced. The estimated ejection fraction was in the range of 35% to 40%. Diffuse hypokinesis. There is akinesis of the inferoseptal myocardium. - Aortic valve: A TAVR bioprosthesis was present. Peak velocity (S): 224 cm/s. Mean gradient (S): 10 mm Hg. Valve area (VTI): 2.63 cm^2. Valve area (Vmax): 2.54 cm^2. Valve area (Vmean): 2.45 cm^2. - Mitral valve: There was mild regurgitation. - Left atrium: The atrium was mildly dilated. - Pulmonary arteries: Systolic pressure was mildly increased. PA peak pressure: 34 mm Hg (S). Impressions: - There was no evidence of a vegetation. Consider TEE if further sensitivity is needed  Patient Profile     84 y.o. male hx of AAA, NICM, chronic CHF (systolic), VT w/ICD, OSA (not on CPAP), arthritis, CBP/lumbar vertebral fracture, prostate ca w/hx of  recurrent UTIs/incontinance, and severe AS recently s/p TAVR admitted with sepsis.  Found with bacteremia KLEBSIELLA ORNITHINOLYTICA, KLEBSIELLA PNEUMONIAE Follow up BC (x1) is neg x 5 days  TEE done 09/08/18 Severe LV dysfunction; severe LAE; s/p AVR; trace AI; moderate (3+) MR; TV not well visualized; oscillating density on ICD lead concerning for vegetation  Device information MDT single chamber ICD implanted 01/11/14 CXR has an abandoned lead, one of the leads is a dual coil ICD lead Hx hx of having sprint Fidelis lead abandoned looks like implant of this lead was 2005  Assessment & Plan     1. ESBL kelbsiella bacteremia     Blood cx showing 2 different isolates of kleb species      on Abx tx w/ID     F/u BC neg x 5 days  2. TEE with what appears a vegitation on device lead 3. Hx of NICM w/VT     Patient has an abandoned Srint Fidelis lead, implanted 2005      New lead and generator from 2015     No hx of CABG     Has TAVR from a few weeks ago 4. Dementia     Wife states when he 1st wakes need "a while" to clear up     Pt was able to be re-oriented easily  Device check done here <0.1% VP Lifetime for this device is the one known ATP in 2016, no shocks 3, NSVT only noted since last interrogation, longest 4 sec  He is scheduled for device system extraction on Tuesday 09/15/18 with Dr. Lovena Le Dr. Caryl Comes noted, ? If possibility of Klebsiella eradication with ABx alone or on suppressive therapy may be an option.    For questions or updates, please contact [place] Please consult [email or web address] for contact info under        Signed, Baldwin [place], PA-C  09/11/2018, 8:48 AM

## 2018-09-11 NOTE — Consult Note (Signed)
Physical Medicine and Rehabilitation Consult   Reason for Consult: debility due to multiple medical issues.   Referring Physician: Dr. Grandville Silos   HPI: William [name] is a 84 y.o. male with history of NICM with CHF, ICD, VT, dementia, severe AS with recent TVAR 08/18/18 who was admitted on 09/04/2018 with sepsis due to Klebsiella ESBL bacteremia.  He was started on meropenem for treatment and TEE done revealing severe LV dysfunction and oscillating density on ICD lead concerning for vegetation.  Lead extraction recommended by [name] and 6 weeks IV antibiotics post surgical procedure.  Dr. Caryl Comes consulted for input and felt that patient was at high risk for surgical intervention and to monitor for now on IV antibiotics.  Patient with multiple admissions in the past month and CIR recommended due to debility  Patient with history of dementia but oriented to person and place time According to wife 1 week ago was able to ambulate the full distance of their home with a walker go to the bathroom independently, patient had sudden onset of fever and chills and then had a decline in his level of function   Review of Systems  Constitutional: Positive for chills. Negative for diaphoresis.  HENT: Negative for congestion and nosebleeds.   Eyes: Negative for discharge and redness.  Respiratory: Negative for cough, hemoptysis, sputum production, shortness of breath, wheezing and stridor.   Cardiovascular: Negative for chest pain and palpitations.  Gastrointestinal: Negative for nausea and vomiting.  Genitourinary: Positive for urgency. Negative for hematuria.       Incontinence  Musculoskeletal: Negative for back pain and myalgias.  Skin: Positive for rash.       Left forearm redness  Neurological: Positive for weakness. Negative for speech change, focal weakness and headaches.  Endo/Heme/Allergies: Negative.   Psychiatric/Behavioral: Positive for memory loss.      Past Medical  History:  Diagnosis Date  . AAA (abdominal aortic aneurysm) ([place])    7/13 3.8cm  . Arthritis    "joints" (01/11/2014)  . Asthma    "seasonal; some foods"   . Chronic systolic CHF (congestive heart failure) ([place])   . Dementia ([place])   . HLD (hyperlipidemia)   . HOH (hard of hearing)   . Lumbar vertebral fracture ([facility])    L1- 04/11/2014   . Prostate cancer ([place])   . S/P ICD (internal cardiac defibrillator) procedure, 01/11/14 removal of ERI gen and placement of Medtronic Evera XT VR & NEW Right ventricular lead Medtronic 01/12/2014  . S/P TAVR (transcatheter aortic valve replacement)    Edwards Sapien 3 THV (size 29 mm, model # [id number], serial # [id number])  . Severe aortic stenosis   . Sleep apnea    "lost 60# & don't have it anymore" (01/11/2014)  . Ventricular tachycardia [facility]    Past Surgical History:  Procedure Laterality Date  . CARDIAC CATHETERIZATION  08/20/2004   noncritical CAD,mild global hypokinesis, EF 50%  . CARDIAC DEFIBRILLATOR PLACEMENT  08/23/2004   Medtronic  . CATARACT EXTRACTION W/ INTRAOCULAR LENS IMPLANT Right   . COLECTOMY  1990's  . CRYOABLATION N/A 02/24/2014   Procedure: CRYO ABLATION PROSTATE;  Surgeon: Ailene Rud, MD;  Location: [facility];  Service: Urology;  Laterality: N/A;  . HERNIA REPAIR     "abdomen; from colon OR"  . IMPLANTABLE CARDIOVERTER DEFIBRILLATOR (ICD) GENERATOR CHANGE N/A 01/11/2014   Procedure: ICD GENERATOR CHANGE;  Surgeon: Sanda Klein, MD;  Location: [place] CATH LAB;  Service: Cardiovascular;  Laterality: N/A;  .  LEAD REVISION N/A 01/11/2014   Procedure: LEAD REVISION;  Surgeon: Sanda Klein, MD;  Location: [place] CATH LAB;  Service: Cardiovascular;  Laterality: N/A;  . NM MYOCAR PERF WALL MOTION  01/29/2012   abnormal c/o infarct/scar,no ischemia present  . PROSTATE BIOPSY N/A 11/22/2013   Procedure: PROSTATE BIOPSY AND ULTRASOUND;  Surgeon: Ailene Rud, MD;  Location: [facility];  Service: Urology;  Laterality: N/A;  . RIGHT/LEFT  HEART CATH AND CORONARY ANGIOGRAPHY N/A 07/06/2018   Procedure: RIGHT/LEFT HEART CATH AND CORONARY ANGIOGRAPHY;  Surgeon: Troy Sine, MD;  Location: [facility] CV LAB;  Service: Cardiovascular;  Laterality: N/A;  . TEE WITHOUT CARDIOVERSION N/A 08/18/2018   Procedure: TRANSESOPHAGEAL ECHOCARDIOGRAM (TEE);  Surgeon: Burnell Blanks, MD;  Location: [place];  Service: Open Heart Surgery;  Laterality: N/A;  . TEE WITHOUT CARDIOVERSION N/A 09/08/2018   Procedure: TRANSESOPHAGEAL ECHOCARDIOGRAM (TEE);  Surgeon: Lelon Perla, MD;  Location: [facility] ENDOSCOPY;  Service: Cardiovascular;  Laterality: N/A;  . TRANSCATHETER AORTIC VALVE REPLACEMENT, TRANSFEMORAL  08/18/2018  . TRANSCATHETER AORTIC VALVE REPLACEMENT, TRANSFEMORAL N/A 08/18/2018   Procedure: TRANSCATHETER AORTIC VALVE REPLACEMENT, TRANSFEMORAL using an Edwards 46mm Aortic Valve;  Surgeon: Burnell Blanks, MD;  Location: [place];  Service: Open Heart Surgery;  Laterality: N/A;    Family History  Problem Relation Age of Onset  . Heart failure Mother        Died of "old age" at 36  . Pneumonia Father     Social History:  Married.  Independent with a walker with limited uses electric wheelchair out of home.  Reports that he has never smoked. He has never used smokeless tobacco. He reports that he drinks alcohol. He reports that he does not use drugs.   Allergies  Allergen Reactions  . Crestor [Rosuvastatin] Other (See Comments)    Hurts muscles  . Lipitor [Atorvastatin] Other (See Comments)    Hurts stomach    Medications Prior to Admission  Medication Sig Dispense Refill  . acetaminophen (TYLENOL) 500 MG tablet Take 500 mg by mouth every 6 (six) hours as needed (for pain).     Marland Kitchen amiodarone (PACERONE) 200 MG tablet TAKE 1/2 TABLET BY MOUTH EVERY DAY (Patient taking differently: Take 100 mg by mouth daily. ) 45 tablet 0  . amoxicillin (AMOXIL) 500 MG capsule Take 4 tablets (2000 mg) 30-60 min prior to dental procedures  (Patient taking differently: Take 2,000 mg by mouth See admin instructions. Take 4 tablets (2000 mg) by mouth 30-60 min prior to dental procedures) 8 capsule 2  . aspirin 81 MG chewable tablet Chew 1 tablet (81 mg total) by mouth daily. 90 tablet 3  . cholecalciferol (VITAMIN D) 1000 units tablet Take 2,000 Units by mouth at bedtime.    . clopidogrel (PLAVIX) 75 MG tablet Take 1 tablet (75 mg total) by mouth daily with breakfast. 90 tablet 1  . ezetimibe-simvastatin (VYTORIN) 10-20 MG per tablet Take 1 tablet by mouth at bedtime.    . furosemide (LASIX) 20 MG tablet Take 1 tablet (20 mg total) by mouth daily. 90 tablet 3  . metoprolol succinate (TOPROL-XL) 50 MG 24 hr tablet Take 1 tablet (50 mg total) by mouth daily. Take with or immediately following a meal. 30 tablet 6  . mineral oil-hydrophilic petrolatum (AQUAPHOR) ointment Apply 1 application topically daily as needed for dry skin.    . Multiple Vitamins-Minerals (PRESERVISION AREDS 2 PO) Take 1 capsule by mouth daily.     . polyethylene glycol (MIRALAX /  GLYCOLAX) packet Take 17 g by mouth every evening. (Patient taking differently: Take by mouth See admin instructions. Dissolve 5 mls miralax powder and water and drink daily with supper) 14 each 1  . potassium chloride (K-DUR) 10 MEQ tablet Take 1 tablet (10 mEq total) by mouth daily. 30 tablet 6  . Triamcinolone Acetonide (TRIAMCINOLONE 0.1 % CREAM : EUCERIN) CREA Apply 1 application topically daily as needed for rash or itching.       Home: Home Living Family/patient expects to be discharged to:: Private residence Living Arrangements: Spouse/significant other Available Help at Discharge: Family, Friend(s), Available 24 hours/day Type of Home: House Home Access: Stairs to enter Technical brewer of Steps: 1(threshold) Entrance Stairs-Rails: None Home Layout: Two level, Able to live on main level with bedroom/bathroom Bathroom Shower/Tub: Tub/shower unit, Walk-in shower(walk in  tub) Bathroom Toilet: Handicapped height Home Equipment: Environmental consultant - 2 wheels, Wheelchair - manual, Hand held shower head, Shower seat, Grab bars - toilet, Grab bars - tub/shower, Environmental consultant - 4 wheels, Bedside commode, Shower seat - built in Additional Comments: pt's family assisting with providing PLOF  Functional History: Prior Function Level of Independence: Needs assistance Gait / Transfers Assistance Needed: supervision with use of RW for limited household ambulation ADL's / Homemaking Assistance Needed: assist for ADL's Comments: uses urinal for voiding bladder at night. Functional Status:  Mobility: Bed Mobility Overal bed mobility: Needs Assistance Bed Mobility: Sit to Supine Supine to sit: Mod assist Sit to supine: Mod assist, +2 for physical assistance General bed mobility comments: A to bring trunk up and then VCs to scoot forward Transfers Overall transfer level: Needs assistance Equipment used: Rolling walker (2 wheeled) Transfers: Sit to/from Stand Sit to Stand: Min assist, +2 physical assistance Stand pivot transfers: Mod assist, +2 safety/equipment General transfer comment: from raised bed x1 and from recliner x4 Ambulation/Gait Ambulation/Gait assistance: +2 physical assistance, Mod assist Gait Distance (Feet): 10 Feet Assistive device: Rolling walker (2 wheeled) Gait Pattern/deviations: Step-to pattern, Decreased step length - right, Decreased step length - left, Shuffle, Trunk flexed, Drifts right/left General Gait Details: Pt with flexed posture and feet too far behind walker with lt foot outside of walker. Repeated verbal/tactile cues to move pt closer to walker. As pt fatigued began leaning posterior and rt and had to bring seat behing. Gait velocity: decr Gait velocity interpretation: <1.31 ft/sec, indicative of household ambulator    ADL: ADL Overall ADL's : Needs assistance/impaired Lower Body Bathing: Total assistance Lower Body Dressing: Total  assistance Toilet Transfer: Minimal assistance, +2 for physical assistance Toilet Transfer Details (indicate cue type and reason): sit<>stand from bed and recliner; ambulating 5-15 feet with +2 min A for safety and A Toileting- Clothing Manipulation and Hygiene: Total assistance Toileting - Clothing Manipulation Details (indicate cue type and reason): Min A +2 sit<>stand and min-Mod A plus VCs to maintain standing General ADL Comments: pt agreeable to bed level movement. pt very concerned with granddaughter present so wife asking her to step out of the room. pt completed bed mobility with incr time and effort . pt requires cues for sequence. wife verbalized patietn is walkign with walker using R UE. question the need for platform to keep good wrist alignment  decr pain  Cognition: Cognition Overall Cognitive Status: History of cognitive impairments - at baseline Orientation Level: Oriented to person, Oriented to place, Oriented to situation Cognition Arousal/Alertness: Awake/alert Behavior During Therapy: WFL for tasks assessed/performed Overall Cognitive Status: History of cognitive impairments - at baseline General Comments: Baseline  dementia. Needs repeated verbal/tactile cues    Blood pressure 110/70, pulse 96, temperature 98.1 F (36.7 C), temperature source Oral, resp. rate 12, height 5\' 10"  (1.778 m), weight 99.4 kg, SpO2 98 %. Physical Exam  Nursing note and vitals reviewed. Constitutional: He appears well-developed and well-nourished. No distress.  HENT:  Head: Normocephalic and atraumatic.  Eyes: Conjunctivae are normal.  Neck: Normal range of motion. Neck supple. No JVD present.  Cardiovascular: Regular rhythm.  No murmur heard. Mild tachycardia  Respiratory: Effort normal and breath sounds normal. No stridor. No respiratory distress. He has no wheezes. He has no rales.  GI: Soft. Bowel sounds are normal. He exhibits no distension. There is no tenderness.  Musculoskeletal:  He exhibits edema.  Edema and erythema left forearm dorsal surface  Neurological: He is alert. He exhibits normal muscle tone.  Strength is 4/5 bilateral deltoid bicep tricep grip 3- bilateral hip flexor knee extensor and ankle dorsiflexors bilaterally line sensation difficult to assess secondary to his attention  Skin: Skin is warm and dry. He is not diaphoretic. There is erythema.  Left forearm dorsal surface  Psychiatric: His affect is blunt. His speech is delayed. Cognition and memory are impaired. He exhibits abnormal recent memory and abnormal remote memory. He is inattentive.    Results for orders placed or performed during the hospital encounter of 09/04/18 (from the past 24 hour(s))  Basic metabolic panel     Status: Abnormal   Collection Time: 09/11/18  3:56 AM  Result Value Ref Range   Sodium 133 (L) 135 - 145 mmol/L   Potassium 3.8 3.5 - 5.1 mmol/L   Chloride 104 98 - 111 mmol/L   CO2 22 22 - 32 mmol/L   Glucose, Bld 130 (H) 70 - 99 mg/dL   BUN 13 8 - 23 mg/dL   Creatinine, Ser 0.98 0.61 - 1.24 mg/dL   Calcium 8.5 (L) 8.9 - 10.3 mg/dL   GFR calc non Af Amer >60 >60 mL/min   GFR calc Af Amer >60 >60 mL/min   Anion gap 7 5 - 15  CBC with Differential/Platelet     Status: Abnormal   Collection Time: 09/11/18  3:56 AM  Result Value Ref Range   WBC 5.1 4.0 - 10.5 K/uL   RBC 3.41 (L) 4.22 - 5.81 MIL/uL   Hemoglobin 11.1 (L) 13.0 - 17.0 g/dL   HCT 32.9 (L) 39.0 - 52.0 %   MCV 96.5 80.0 - 100.0 fL   MCH 32.6 26.0 - 34.0 pg   MCHC 33.7 30.0 - 36.0 g/dL   RDW 15.6 (H) 11.5 - 15.5 %   Platelets 111 (L) 150 - 400 K/uL   nRBC 0.0 0.0 - 0.2 %   Neutrophils Relative % 60 %   Neutro Abs 3.1 1.7 - 7.7 K/uL   Lymphocytes Relative 20 %   Lymphs Abs 1.0 0.7 - 4.0 K/uL   Monocytes Relative 9 %   Monocytes Absolute 0.5 0.1 - 1.0 K/uL   Eosinophils Relative 9 %   Eosinophils Absolute 0.5 0.0 - 0.5 K/uL   Basophils Relative 1 %   Basophils Absolute 0.0 0.0 - 0.1 K/uL   Immature  Granulocytes 1 %   Abs Immature Granulocytes 0.03 0.00 - 0.07 K/uL   No results found.   Assessment/Plan: Diagnosis: Deconditioning after bacteremia secondary to infected ICD lead 1. Does the need for close, 24 hr/day medical supervision in concert with the patient's rehab needs make it unreasonable for this patient to be  served in a less intensive setting? Yes 2. Co-Morbidities requiring supervision/potential complications: Moderate dementia, status post TAVR in November 2019 3. Due to bladder management, bowel management, safety, skin/wound care, disease management, medication administration, pain management and patient education, does the patient require 24 hr/day rehab nursing? Yes 4. Does the patient require coordinated care of a physician, rehab nurse, PT (1-2 hrs/day, 5 days/week), OT (1-2 hrs/day, 5 days/week) and SLP (.5-1 hrs/day, 5 days/week) to address physical and functional deficits in the context of the above medical diagnosis(es)? Yes Addressing deficits in the following areas: balance, endurance, locomotion, strength, transferring, bowel/bladder control, bathing, dressing, feeding, grooming, toileting, cognition and psychosocial support 5. Can the patient actively participate in an intensive therapy program of at least 3 hrs of therapy per day at least 5 days per week? Potentially 6. The potential for patient to make measurable gains while on inpatient rehab is good 7. Anticipated functional outcomes upon discharge from inpatient rehab are supervision  with PT, supervision with OT, supervision with SLP. 8. Estimated rehab length of stay to reach the above functional goals is: 1 to 2 weeks depending on whether he has further deconditioning with prolonged hospital stay 9. Anticipated D/C setting: Home 10. Anticipated post D/C treatments: [place] therapy 11. Overall Rehab/Functional Prognosis: fair  RECOMMENDATIONS: This patient's condition is appropriate for continued rehabilitative  care in the following setting: Continue PT OT in acute care setting until lead removal Patient has agreed to participate in recommended program. Yes Note that insurance prior authorization may be required for reimbursement for recommended care.  Comment: Rehab admission coordinator team will follow patient "I have personally performed a face to face diagnostic evaluation of this patient.  Additionally, I have reviewed and concur with the physician assistant's documentation above."\ Charlett Blake M.D. [facility] FAAPM&R (Sports Med, Neuromuscular Med) Diplomate Am Board of [place], PA-C 09/11/2018

## 2018-09-11 NOTE — Progress Notes (Signed)
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  Patient was recently admitted from 08/07/2018-08/13/2018 for septic shock secondary to Klebsiella UTI. He was admitted again from 08/18/2018-08/20/2018 for successful TAVR procedure.  In the ED he was found to have a pulse of 120, RR 26, temp 101.70F. Urinalysis was negative. CXR showed a right basilar opacity suggestive of atelectasis versus airspace disease/pneumonia. CT abdomen/pelvis with contrast showed uncomplicated cholelithiasis, 4.4 cm infrarenal AAA, bilateral simple and complex renal cyst, and colonic diverticulosis without acute diverticulitis.   Assessment & Plan:   Principal Problem:   Sepsis ([place]) Active Problems:   Nonischemic cardiomyopathy (HCC)   Hyperlipidemia   S/P ICD (internal cardiac defibrillator) procedure, 01/11/14 removal of ERI gen and placement of Medtronic Evera XT VR & NEW Right ventricular lead Medtronic   Ventricular tachycardia (HCC)   S/P TAVR (transcatheter aortic valve replacement)   Bacteremia due to Klebsiella pneumoniae   Acute bacterial endocarditis   Infected defibrillator (HCC)  1 ESBL Klebsiella bacteremia/pacemaker lead vegetation/endocarditis Patient presented with fevers and chills blood cultures positive for ESBL Klebsiella bacteremia.  Patient underwent 2D echo which showed a EF of 35 to 40%, diffuse hypokinesis, akinesis of the inferior septal myocardium, TAVR bioprosthesis present with no evidence of vegetation.  Patient status post TEE on 09/08/2018 concerning for vegetation to lead.  Patient seen in consultation by ID who  are recommending 6 weeks of IV meropenem.  Repeat blood cultures done 09/06/2018 with no growth to date.  Patient seen by cardiology, cardiothoracic surgery as well as the EP team and patient for device extraction on Tuesday, 09/15/2018.  Per ID continue IV antibiotics until lead extraction and then will need a total of 6 weeks of IV antibiotics post lead extraction.  PICC line to be placed post lead extraction.  ID and EP cardiology following.  Follow.  2.  Severe aortic stenosis status post TAVR 08/18/2018 Valve team following.  3.  Hypotension Hypotension improved.  Systolic blood pressure now 110/70.  IV fluids have been saline locked.  Follow.   4.  Chronic systolic heart failure/nonischemic cardiomyopathy Patient noted per 2D echo of 09/04/2018 12 a EF of 35 to 40%.  Patient currently euvolemic on examination.  Current weight is pending.  Weight on 09/10/2018 was 99.4 kg from 98.3 kg from 98.3 kg from 97.9 kg from 100.4 kg.  Monitor volume status closely.  Saline lock IV fluids.  Patient with a urine output of 0.525 L over the past 24 hours.  Per cardiology.  5.  Hyperlipidemia Continue Vytorin.  6.  Infrarenal AAA Patient with a 4.4 cm infrarenal fusiform AAA seen on CT abdomen and pelvis.  Followed by T CTS.  7.  History of ventricular tachycardia status post ICD Continue amiodarone.  Patient has been evaluated by EP and patient for device extraction early next week 09/15/2018. Per EP.  8.  Anemia Patient with no overt bleeding.  Hemoglobin currently stable at 11.1.  Follow H&H.  Transfusion threshold hemoglobin less than 7.   DVT prophylaxis: Lovenox Code Status: Full Family Communication: Updated patient and wife and daughter at bedside.  Disposition Plan: Transfer to telemetry.  Per cardiology.   Consultants:  Cardiothoracic surgery: Dr. Alvan Dame 09/04/2018  Infectious disease: Dr. Johnnye Sima 09/05/2018  Cardiology/valve team Dr. Angelena Form 09/08/2018  Cardiology: Dr. Debara Pickett  09/06/2018  Electrophysiology: Dr. Lovena Le 09/09/2018  Procedures:   CT abdomen and pelvis 09/04/2018  Chest x-ray 09/04/2018  2D echo 09/04/2018  Transesophageal echocardiogram 09/08/2018--- severe global reduction in LV systolic function: Status post AVR with trace periprosthetic AI, moderate MR,severe LAE, oscillating density on pacer wire concerning for vegetation.--- Per Dr. Stanford Breed  Antimicrobials:   IV cefepime 09/04/2018>>>> 09/05/2018  IV Rocephin 09/05/2018>>>>> 09/07/2018  IV Merrem 09/07/2018  IV vancomycin 09/04/2018>>>>> 09/05/2018   Subjective: Sleeping.  Easily arousable.  No chest pain.  No shortness of breath.  Family concerned patient has not been moved around that much.    Objective: Vitals:   09/10/18 1702 09/10/18 2035 09/10/18 2359 09/11/18 0441  BP: 101/67 106/63 106/80 110/70  Pulse:   97 96  Resp: (!) 24 (!) 21 (!) 24 12  Temp: 98.2 F (36.8 C) 97.9 F (36.6 C) 97.9 F (36.6 C) 98.1 F (36.7 C)  TempSrc: Oral Oral Oral Oral  SpO2: 95% 98% 94% 98%  Weight:      Height:        Intake/Output Summary (Last 24 hours) at 09/11/2018 1104 Last data filed at 09/11/2018 1000 Gross per 24 hour  Intake 420.16 ml  Output 525 ml  Net -104.84 ml   Filed Weights   09/08/18 0511 09/09/18 0402 09/10/18 0520  Weight: 98.3 kg 98.3 kg 99.4 kg    Examination:  General exam: NAD.   Respiratory system: Clear to auscultation bilaterally.  No wheezes, no crackles, no rhonchi.  Normal respiratory effort.  Cardiovascular system: Regular rate and rhythm no murmurs rubs or gallops.  No JVD.  Trace lower extremity edema.  Gastrointestinal system: Abdomen is nontender, nondistended, soft, positive bowel sounds.  No rebound.  No guarding. Central nervous system: Alert and oriented. No focal neurological deficits. Extremities: Trace bilateral lower extremity edema.  Symmetric 5 x 5 power. Skin: No rashes, lesions or ulcers Psychiatry: Judgement and insight  appear normal. Mood & affect appropriate.     Data Reviewed: I have personally reviewed following labs and imaging studies  CBC: Recent Labs  Lab 09/04/18 1425  09/07/18 0303 09/08/18 0719 09/09/18 0826 09/10/18 0317 09/11/18 0356  WBC 6.4   < > 4.4 3.8* 4.2 4.2 5.1  NEUTROABS 5.6  --   --   --  2.7 2.4 3.1  HGB 13.0   < > 10.4* 11.0* 11.4* 11.1* 11.1*  HCT 36.9*   < > 31.6* 31.9* 33.0* 32.2* 32.9*  MCV 95.3   < > 97.8 96.4 97.3 96.7 96.5  PLT 108*   < > 90* 97* 106* 108* 111*   < > = values in this interval not displayed.   Basic Metabolic Panel: Recent Labs  Lab 09/07/18 0303 09/08/18 0719 09/09/18 0826 09/10/18 0317 09/11/18 0356  NA 134* 135 132* 134* 133*  K 3.9 3.5 3.7 3.6 3.8  CL 104 105 103 103 104  CO2 24 23 21* 21* 22  GLUCOSE 126* 129* 126* 115* 130*  BUN 13 12 13 12 13   CREATININE 0.83 0.95 0.92 0.92 0.98  CALCIUM 8.0* 8.1* 8.2* 8.3* 8.5*  MG  --  2.3 2.2  --   --    GFR: Estimated Creatinine Clearance: 61.6 mL/min (by C-G formula based on SCr of 0.98 mg/dL). Liver Function Tests: Recent Labs  Lab 09/05/18 0344 09/06/18 0250 09/07/18 0303 09/08/18 0719  09/09/18 0826  AST 133* 65* 34 28 26  ALT 148* 100* 66* 47* 42  ALKPHOS 278* 219* 203* 175* 155*  BILITOT 2.2* 1.6* 1.2 1.2 1.4*  PROT 6.3* 6.1* 5.7* 5.7* 5.6*  ALBUMIN 2.8* 2.6* 2.5* 2.4* 2.5*   No results for input(s): LIPASE, AMYLASE in the last 168 hours. No results for input(s): AMMONIA in the last 168 hours. Coagulation Profile: No results for input(s): INR, PROTIME in the last 168 hours. Cardiac Enzymes: Recent Labs  Lab 09/04/18 2044  TROPONINI 0.03*   BNP (last 3 results) No results for input(s): PROBNP in the last 8760 hours. HbA1C: No results for input(s): HGBA1C in the last 72 hours. CBG: No results for input(s): GLUCAP in the last 168 hours. Lipid Profile: No results for input(s): CHOL, HDL, LDLCALC, TRIG, CHOLHDL, LDLDIRECT in the last 72 hours. Thyroid Function  Tests: No results for input(s): TSH, T4TOTAL, FREET4, T3FREE, THYROIDAB in the last 72 hours. Anemia Panel: No results for input(s): VITAMINB12, FOLATE, FERRITIN, TIBC, IRON, RETICCTPCT in the last 72 hours. Sepsis Labs: Recent Labs  Lab 09/04/18 1449 09/04/18 1642  LATICACIDVEN 3.64* 2.74*    Recent Results (from the past 240 hour(s))  Blood Culture (routine x 2)     Status: Abnormal   Collection Time: 09/04/18  2:25 PM  Result Value Ref Range Status   Specimen Description BLOOD BLOOD RIGHT FOREARM  Final   Special Requests   Final    BOTTLES DRAWN AEROBIC AND ANAEROBIC Blood Culture adequate volume   Culture  Setup Time   Final    GRAM NEGATIVE RODS IN BOTH AEROBIC AND ANAEROBIC BOTTLES CRITICAL RESULT CALLED TO, READ BACK BY AND VERIFIED WITHDenton Brick [facility] 5400 09/05/18 A BROWNING Performed at [facility], [place] [address], [place], [place] [address]    Culture (A)  Final    KLEBSIELLA PNEUMONIAE Confirmed Extended Spectrum Beta-Lactamase Producer (ESBL).  In bloodstream infections from ESBL organisms, carbapenems are preferred over piperacillin/tazobactam. They are shown to have a lower risk of mortality.    Report Status 09/07/2018 FINAL  Final   Organism ID, Bacteria KLEBSIELLA PNEUMONIAE  Final      Susceptibility   Klebsiella pneumoniae - MIC*    AMPICILLIN >=32 RESISTANT Resistant     CEFAZOLIN >=64 RESISTANT Resistant     CEFEPIME RESISTANT Resistant     CEFTAZIDIME 32 RESISTANT Resistant     CEFTRIAXONE >=64 RESISTANT Resistant     CIPROFLOXACIN >=4 RESISTANT Resistant     GENTAMICIN >=16 RESISTANT Resistant     IMIPENEM <=0.25 SENSITIVE Sensitive     TRIMETH/SULFA >=320 RESISTANT Resistant     AMPICILLIN/SULBACTAM >=32 RESISTANT Resistant     PIP/TAZO 32 INTERMEDIATE Intermediate     Extended ESBL POSITIVE Resistant     * KLEBSIELLA PNEUMONIAE  Blood Culture ID Panel (Reflexed)     Status: Abnormal   Collection Time: 09/04/18  2:25 PM  Result Value  Ref Range Status   Enterococcus species NOT DETECTED NOT DETECTED Final   Listeria monocytogenes NOT DETECTED NOT DETECTED Final   Staphylococcus species NOT DETECTED NOT DETECTED Final   Staphylococcus aureus (BCID) NOT DETECTED NOT DETECTED Final   Streptococcus species NOT DETECTED NOT DETECTED Final   Streptococcus agalactiae NOT DETECTED NOT DETECTED Final   Streptococcus pneumoniae NOT DETECTED NOT DETECTED Final   Streptococcus pyogenes NOT DETECTED NOT DETECTED Final   Acinetobacter baumannii NOT DETECTED NOT DETECTED Final   Enterobacteriaceae species DETECTED (A) NOT DETECTED Final  Comment: Enterobacteriaceae represent a large family of gram-negative bacteria, not a single organism. CRITICAL RESULT CALLED TO, READ BACK BY AND VERIFIED WITH: Denton Brick [facility] [id number] 09/05/18 A BROWNING    Enterobacter cloacae complex NOT DETECTED NOT DETECTED Final   Escherichia coli NOT DETECTED NOT DETECTED Final   Klebsiella oxytoca NOT DETECTED NOT DETECTED Final   Klebsiella pneumoniae DETECTED (A) NOT DETECTED Final    Comment: CRITICAL RESULT CALLED TO, READ BACK BY AND VERIFIED WITH: Denton Brick PHARMD [id number] 09/05/18 A BROWNING    Proteus species NOT DETECTED NOT DETECTED Final   Serratia marcescens NOT DETECTED NOT DETECTED Final   Carbapenem resistance NOT DETECTED NOT DETECTED Final   Haemophilus influenzae NOT DETECTED NOT DETECTED Final   Neisseria meningitidis NOT DETECTED NOT DETECTED Final   Pseudomonas aeruginosa NOT DETECTED NOT DETECTED Final   Candida albicans NOT DETECTED NOT DETECTED Final   Candida glabrata NOT DETECTED NOT DETECTED Final   Candida krusei NOT DETECTED NOT DETECTED Final   Candida parapsilosis NOT DETECTED NOT DETECTED Final   Candida tropicalis NOT DETECTED NOT DETECTED Final    Comment: Performed at [facility], [address] [place], [place] [address]  Blood Culture (routine x 2)     Status: Abnormal   Collection Time: 09/04/18  2:30 PM    Result Value Ref Range Status   Specimen Description BLOOD BLOOD LEFT WRIST  Final   Special Requests   Final    BOTTLES DRAWN AEROBIC AND ANAEROBIC Blood Culture results may not be optimal due to an inadequate volume of blood received in culture bottles   Culture  Setup Time   Final    GRAM NEGATIVE RODS AEROBIC BOTTLE ONLY CRITICAL RESULT CALLED TO, READ BACK BY AND VERIFIED WITHDenton Brick [facility] 8416 09/05/18 A BROWNING Performed at [facility], [place] [address], [place], [place] [address]    Culture KLEBSIELLA ORNITHINOLYTICA (A)  Final   Report Status 09/08/2018 FINAL  Final   Organism ID, Bacteria KLEBSIELLA ORNITHINOLYTICA  Final      Susceptibility   Klebsiella ornithinolytica - MIC*    AMPICILLIN >=32 RESISTANT Resistant     CEFAZOLIN >=64 RESISTANT Resistant     CEFEPIME 4 SENSITIVE Sensitive     CEFTAZIDIME 16 INTERMEDIATE Intermediate     CEFTRIAXONE >=64 RESISTANT Resistant     CIPROFLOXACIN >=4 RESISTANT Resistant     GENTAMICIN >=16 RESISTANT Resistant     IMIPENEM <=0.25 SENSITIVE Sensitive     TRIMETH/SULFA >=320 RESISTANT Resistant     AMPICILLIN/SULBACTAM >=32 RESISTANT Resistant     PIP/TAZO 32 INTERMEDIATE Intermediate     * KLEBSIELLA ORNITHINOLYTICA  Culture, blood (Routine X 2) w Reflex to ID Panel     Status: None   Collection Time: 09/06/18  8:12 AM  Result Value Ref Range Status   Specimen Description BLOOD LEFT ANTECUBITAL  Final   Special Requests   Final    BOTTLES DRAWN AEROBIC ONLY Blood Culture adequate volume   Culture   Final    NO GROWTH 5 DAYS Performed at [facility], [address] [place], [place] [address]    Report Status 09/11/2018 FINAL  Final         Radiology Studies: No results found.      Scheduled Meds: . amiodarone  100 mg Oral Daily  . aspirin  81 mg Oral Daily  . clopidogrel  75 mg Oral Q breakfast  . enoxaparin (LOVENOX) injection  40 mg Subcutaneous Q24H  . ezetimibe-simvastatin  1 tablet Oral  QHS  . sodium chloride flush  3 mL Intravenous Q12H   Continuous Infusions: . meropenem (MERREM) IV 1 g (09/11/18 0625)     LOS: 7 days    Time spent: 35 minutes    Irine Seal, MD [facility] Pager [phone]  If 7PM-7AM, please contact night-coverage [email or web address] Password [email or web address] 09/11/2018, 11:04 AM

## 2018-09-11 NOTE — Progress Notes (Signed)
Physical Therapy Treatment Patient Details Name: William [name] MRN: [id number] DOB: 06/07/1928 Today's Date: 09/11/2018    History of Present Illness Pt is a 84 y.o. M with significant PMH of chronic systolic EF, ICM, severe AS s/p TAVR 11/5, hx of ventricular tachycardia s/p ICD, AAA, hyperlipidemia, prostate CA with recurrent UTI, who presents with sudden onset of fever, chills, fatigue. Recent admits 10/25-10/31 for septic shock secondary to UTI. Admitted 11/5-11/7 for TAVR. Concern for TAVR infection from previous UTI.     PT Comments    Patient with some regression towards physical therapy goals this session secondary to fatigue, weakness, and decreased safety awareness. Requiring two person maximal assistance to sit on edge of bed. Demonstrates poor sitting balance with posterior lean requiring intermittent mod-max assist to correct back to upright. Patient stood with two person maximal assistance to walker. He took 1-2 steps forward, then sat down abruptly, requiring therapist to push him posteriorly back onto bed to prevent a fall onto the floor. Pt then sitting on the very edge of the bed with bilateral knees blocked to prevent anterior slide, unable to scoot his hips posteriorly (no bed pad underneath for PT to assist). Required two person total assistance to lay back to supine in order to reposition. Pt wife disappointed he did not get up to the chair. Recommended Stedy transfers to staff due to pt high fall risk.    Follow Up Recommendations  CIR     Equipment Recommendations  Other (comment)(defer)    Recommendations for Other Services Rehab consult     Precautions / Restrictions Precautions Precautions: Fall Required Braces or Orthoses: Other Brace Other Brace: Right wrist splint from recent wrist dislocation per pt family; but can use walker with splint on per family Restrictions Weight Bearing Restrictions: No    Mobility  Bed Mobility Overal bed mobility: Needs  Assistance Bed Mobility: Sit to Supine;Supine to Sit     Supine to sit: Max assist;+2 for safety/equipment Sit to supine: Total assist;+2 for physical assistance   General bed mobility comments: Max assist + 2 to bring BLE's off edge of bed and elevate trunk. TotalA + 2 to return to bed   Transfers Overall transfer level: Needs assistance Equipment used: Rolling walker (2 wheeled) Transfers: Sit to/from Stand Sit to Stand: Max assist;+2 physical assistance         General transfer comment: MaxA + 2 to stand from elevated surface  Ambulation/Gait             General Gait Details: further ambulation deferred due to near fall   Stairs             Wheelchair Mobility    Modified Rankin (Stroke Patients Only)       Balance Overall balance assessment: Needs assistance Sitting-balance support: Feet supported;Bilateral upper extremity supported Sitting balance-Leahy Scale: Poor Sitting balance - Comments: posterior lean with BUE support   Standing balance support: Bilateral upper extremity supported;During functional activity Standing balance-Leahy Scale: Poor                              Cognition Arousal/Alertness: Awake/alert Behavior During Therapy: WFL for tasks assessed/performed Overall Cognitive Status: History of cognitive impairments - at baseline                                 General Comments: Baseline dementia. Needs  repeated verbal/tactile cues. Very poor safety awareness      Exercises      General Comments        Pertinent Vitals/Pain Pain Assessment: No/denies pain    Home Living                      Prior Function            PT Goals (current goals can now be found in the care plan section) Acute Rehab PT Goals Patient Stated Goal: pt family wants him up in the chair PT Goal Formulation: With patient Time For Goal Achievement: 09/20/18 Potential to Achieve Goals: Fair Progress towards  PT goals: Not progressing toward goals - comment(regression this session)    Frequency    Min 3X/week      PT Plan Discharge plan needs to be updated    Co-evaluation              AM-PAC PT "6 Clicks" Mobility   Outcome Measure  Help needed turning from your back to your side while in a flat bed without using bedrails?: A Lot Help needed moving from lying on your back to sitting on the side of a flat bed without using bedrails?: A Lot Help needed moving to and from a bed to a chair (including a wheelchair)?: A Lot Help needed standing up from a chair using your arms (e.g., wheelchair or bedside chair)?: A Lot Help needed to walk in hospital room?: A Lot Help needed climbing 3-5 steps with a railing? : Total 6 Click Score: 11    End of Session Equipment Utilized During Treatment: Gait belt Activity Tolerance: Patient limited by fatigue Patient left: in bed;with call bell/phone within reach;with bed alarm set;with family/visitor present Nurse Communication: Mobility status PT Visit Diagnosis: Unsteadiness on feet (R26.81);Other abnormalities of gait and mobility (R26.89);Muscle weakness (generalized) (M62.81)     Time: 8828-0034 PT Time Calculation (min) (ACUTE ONLY): 30 min  Charges:  $Therapeutic Activity: 23-37 mins                    Ellamae Sia, [place], DPT Acute Rehabilitation Services Pager [phone] Office [phone]    Willy Eddy 09/11/2018, 11:34 AM

## 2018-09-11 NOTE — Progress Notes (Signed)
Patients bed found to be wet by family. Family is was concerned that antibiotics potentially did not go in. IV bag, tubing, and  IV site checked. No indication of any leaking or infiltration. Dressing and IV changed. Pharmacy notified instructed to give scheduled dose at 0600. Will continue to monitor.

## 2018-09-12 LAB — BASIC METABOLIC PANEL
Anion gap: 8 (ref 5–15)
BUN: 16 mg/dL (ref 8–23)
CO2: 25 mmol/L (ref 22–32)
CREATININE: 0.96 mg/dL (ref 0.61–1.24)
Calcium: 8.2 mg/dL — ABNORMAL LOW (ref 8.9–10.3)
Chloride: 99 mmol/L (ref 98–111)
GLUCOSE: 121 mg/dL — AB (ref 70–99)
Potassium: 3.4 mmol/L — ABNORMAL LOW (ref 3.5–5.1)
Sodium: 132 mmol/L — ABNORMAL LOW (ref 135–145)

## 2018-09-12 MED ORDER — POTASSIUM CHLORIDE 20 MEQ PO PACK
40.0000 meq | PACK | Freq: Once | ORAL | Status: AC
  Administered 2018-09-12: 40 meq via ORAL
  Filled 2018-09-12: qty 2

## 2018-09-12 NOTE — Progress Notes (Addendum)
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  Patient was recently admitted from 08/07/2018-08/13/2018 for septic shock secondary to Klebsiella UTI. He was admitted again from 08/18/2018-08/20/2018 for successful TAVR procedure.  In the ED he was found to have a pulse of 120, RR 26, temp 101.44F. Urinalysis was negative. CXR showed a right basilar opacity suggestive of atelectasis versus airspace disease/pneumonia. CT abdomen/pelvis with contrast showed uncomplicated cholelithiasis, 4.4 cm infrarenal AAA, bilateral simple and complex renal cyst, and colonic diverticulosis without acute diverticulitis.   Assessment & Plan:   Principal Problem:   Sepsis ([place]) Active Problems:   Nonischemic cardiomyopathy (HCC)   Hyperlipidemia   S/P ICD (internal cardiac defibrillator) procedure, 01/11/14 removal of ERI gen and placement of Medtronic Evera XT VR & NEW Right ventricular lead Medtronic   Ventricular tachycardia (HCC)   S/P TAVR (transcatheter aortic valve replacement)   Bacteremia due to Klebsiella pneumoniae   Acute bacterial endocarditis   Infected defibrillator (HCC)  1 ESBL Klebsiella bacteremia/pacemaker lead vegetation/endocarditis Patient presented with fevers and chills blood cultures positive for ESBL Klebsiella bacteremia.  Patient underwent 2D echo which showed a EF of 35 to 40%, diffuse hypokinesis, akinesis of the inferior septal myocardium, TAVR bioprosthesis present with no evidence of vegetation.  Patient status post TEE on 09/08/2018 concerning for vegetation to lead.  Patient seen in consultation by ID who  are recommending 6 weeks of IV meropenem.  Repeat blood cultures done 09/06/2018 with no growth to date.  Patient seen by cardiology, cardiothoracic surgery as well as the EP team and patient for device extraction on Tuesday, 09/15/2018.  Per ID continue IV antibiotics until lead extraction and then will need a total of 6 weeks of IV antibiotics post lead extraction.  PICC line to be placed post lead extraction.  ID and EP cardiology following.  Follow.  2.  Severe aortic stenosis status post TAVR 08/18/2018 Valve team following.  3.  Hypotension Hypotension improved.  Systolic blood pressure now 111/64.  IV fluids have been saline locked.  Follow.   4.  Chronic systolic heart failure/nonischemic cardiomyopathy Patient noted per 2D echo of 09/04/2018 12 a EF of 35 to 40%.  Patient currently euvolemic on examination.  Current weight is 96.2 kg from Weight on 09/10/2018 was 99.4 kg from 98.3 kg from 98.3 kg from 97.9 kg from 100.4 kg.  Monitor volume status closely.  Saline lock IV fluids.  Patient with a urine output of 1.950 L over the past 24 hours.  Per cardiology.  5.  Hyperlipidemia Stable.  Continue Vytorin.  Outpatient follow-up.   6.  Infrarenal AAA Patient with a 4.4 cm infrarenal fusiform AAA seen on CT abdomen and pelvis.  Followed by T CTS.  7.  History of ventricular tachycardia status post ICD Continue amiodarone.  Patient has been evaluated by EP and patient for device extraction early next week 09/15/2018. Per EP.  8.  Anemia Patient with no overt bleeding.  Hemoglobin currently stable at 11.1.  Follow H&H.  Transfusion threshold hemoglobin less than 7.  9.  Hypokalemia Replete.   DVT prophylaxis: Lovenox Code Status: Full Family Communication: Updated patient and daughter at bedside.  Disposition  Plan: Remain on telemetry.  CIR when okay with cardiology.     Consultants:   Cardiothoracic surgery: Dr. Alvan Dame 09/04/2018  Infectious disease: Dr. Johnnye Sima  09/05/2018  Cardiology/valve team Dr. Angelena Form 09/08/2018  Cardiology: Dr. Debara Pickett 09/06/2018  Electrophysiology: Dr. Lovena Le 09/09/2018  Procedures:   CT abdomen and pelvis 09/04/2018  Chest x-ray 09/04/2018  2D echo 09/04/2018  Transesophageal echocardiogram 09/08/2018--- severe global reduction in LV systolic function: Status post AVR with trace periprosthetic AI, moderate MR,severe LAE, oscillating density on pacer wire concerning for vegetation.--- Per Dr. Stanford Breed  Antimicrobials:   IV cefepime 09/04/2018>>>> 09/05/2018  IV Rocephin 09/05/2018>>>>> 09/07/2018  IV Merrem 09/07/2018  IV vancomycin 09/04/2018>>>>> 09/05/2018   Subjective: Laying in bed.  Daughter at bedside.  Denies any chest pain or shortness of breath.  Feels better today.    Objective: Vitals:   09/11/18 1849 09/11/18 2019 09/12/18 0500 09/12/18 1008  BP: 101/72 105/71  111/64  Pulse: 81 94  90  Resp:  15  20  Temp: 98.3 F (36.8 C) 97.8 F (36.6 C)  98 F (36.7 C)  TempSrc: Oral Oral  Oral  SpO2: 100% 98%  99%  Weight:   96.2 kg   Height:        Intake/Output Summary (Last 24 hours) at 09/12/2018 1046 Last data filed at 09/12/2018 0937 Gross per 24 hour  Intake 6 ml  Output 1950 ml  Net -1944 ml   Filed Weights   09/09/18 0402 09/10/18 0520 09/12/18 0500  Weight: 98.3 kg 99.4 kg 96.2 kg    Examination:  General exam: NAD.   Respiratory system: Lungs clear to auscultation bilaterally anterior lung fields.  No wheezes, no crackles, no rhonchi.  Normal respiratory effort.  Cardiovascular system: RRR no murmurs rubs or gallops.  No JVD.  Trace lower extremity edema.  Gastrointestinal system: Abdomen is soft, nontender, nondistended, positive bowel sounds.  No rebound.  No guarding.  Central nervous system: Alert and oriented. No focal neurological deficits. Extremities: Trace bilateral lower extremity edema.  Symmetric 5 x 5 power. Skin: No rashes, lesions or ulcers Psychiatry:  Judgement and insight appear fair. Mood & affect appropriate.     Data Reviewed: I have personally reviewed following labs and imaging studies  CBC: Recent Labs  Lab 09/07/18 0303 09/08/18 0719 09/09/18 0826 09/10/18 0317 09/11/18 0356  WBC 4.4 3.8* 4.2 4.2 5.1  NEUTROABS  --   --  2.7 2.4 3.1  HGB 10.4* 11.0* 11.4* 11.1* 11.1*  HCT 31.6* 31.9* 33.0* 32.2* 32.9*  MCV 97.8 96.4 97.3 96.7 96.5  PLT 90* 97* 106* 108* 322*   Basic Metabolic Panel: Recent Labs  Lab 09/08/18 0719 09/09/18 0826 09/10/18 0317 09/11/18 0356 09/12/18 0247  NA 135 132* 134* 133* 132*  K 3.5 3.7 3.6 3.8 3.4*  CL 105 103 103 104 99  CO2 23 21* 21* 22 25  GLUCOSE 129* 126* 115* 130* 121*  BUN 12 13 12 13 16   CREATININE 0.95 0.92 0.92 0.98 0.96  CALCIUM 8.1* 8.2* 8.3* 8.5* 8.2*  MG 2.3 2.2  --   --   --    GFR: Estimated Creatinine Clearance: 61.9 mL/min (by C-G formula based on SCr of 0.96 mg/dL). Liver Function Tests: Recent Labs  Lab 09/06/18 0250 09/07/18 0303 09/08/18 0719 09/09/18 0826  AST 65* 34 28 26  ALT 100* 66* 47* 42  ALKPHOS 219* 203* 175* 155*  BILITOT 1.6* 1.2 1.2 1.4*  PROT 6.1* 5.7* 5.7* 5.6*  ALBUMIN 2.6*  2.5* 2.4* 2.5*   No results for input(s): LIPASE, AMYLASE in the last 168 hours. No results for input(s): AMMONIA in the last 168 hours. Coagulation Profile: No results for input(s): INR, PROTIME in the last 168 hours. Cardiac Enzymes: No results for input(s): CKTOTAL, CKMB, CKMBINDEX, TROPONINI in the last 168 hours. BNP (last 3 results) No results for input(s): PROBNP in the last 8760 hours. HbA1C: No results for input(s): HGBA1C in the last 72 hours. CBG: No results for input(s): GLUCAP in the last 168 hours. Lipid Profile: No results for input(s): CHOL, HDL, LDLCALC, TRIG, CHOLHDL, LDLDIRECT in the last 72 hours. Thyroid Function Tests: No results for input(s): TSH, T4TOTAL, FREET4, T3FREE, THYROIDAB in the last 72 hours. Anemia Panel: No results for  input(s): VITAMINB12, FOLATE, FERRITIN, TIBC, IRON, RETICCTPCT in the last 72 hours. Sepsis Labs: No results for input(s): PROCALCITON, LATICACIDVEN in the last 168 hours.  Recent Results (from the past 240 hour(s))  Blood Culture (routine x 2)     Status: Abnormal   Collection Time: 09/04/18  2:25 PM  Result Value Ref Range Status   Specimen Description BLOOD BLOOD RIGHT FOREARM  Final   Special Requests   Final    BOTTLES DRAWN AEROBIC AND ANAEROBIC Blood Culture adequate volume   Culture  Setup Time   Final    GRAM NEGATIVE RODS IN BOTH AEROBIC AND ANAEROBIC BOTTLES CRITICAL RESULT CALLED TO, READ BACK BY AND VERIFIED WITHDenton Brick [facility] [id number] 09/05/18 A BROWNING Performed at [facility], [place] [address] [place], [place] [address]    Culture (A)  Final    KLEBSIELLA PNEUMONIAE Confirmed Extended Spectrum Beta-Lactamase Producer (ESBL).  In bloodstream infections from ESBL organisms, carbapenems are preferred over piperacillin/tazobactam. They are shown to have a lower risk of mortality.    Report Status 09/07/2018 FINAL  Final   Organism ID, Bacteria KLEBSIELLA PNEUMONIAE  Final      Susceptibility   Klebsiella pneumoniae - MIC*    AMPICILLIN >=32 RESISTANT Resistant     CEFAZOLIN >=64 RESISTANT Resistant     CEFEPIME RESISTANT Resistant     CEFTAZIDIME 32 RESISTANT Resistant     CEFTRIAXONE >=64 RESISTANT Resistant     CIPROFLOXACIN >=4 RESISTANT Resistant     GENTAMICIN >=16 RESISTANT Resistant     IMIPENEM <=0.25 SENSITIVE Sensitive     TRIMETH/SULFA >=320 RESISTANT Resistant     AMPICILLIN/SULBACTAM >=32 RESISTANT Resistant     PIP/TAZO 32 INTERMEDIATE Intermediate     Extended ESBL POSITIVE Resistant     * KLEBSIELLA PNEUMONIAE  Blood Culture ID Panel (Reflexed)     Status: Abnormal   Collection Time: 09/04/18  2:25 PM  Result Value Ref Range Status   Enterococcus species NOT DETECTED NOT DETECTED Final   Listeria monocytogenes NOT DETECTED NOT DETECTED Final    Staphylococcus species NOT DETECTED NOT DETECTED Final   Staphylococcus aureus (BCID) NOT DETECTED NOT DETECTED Final   Streptococcus species NOT DETECTED NOT DETECTED Final   Streptococcus agalactiae NOT DETECTED NOT DETECTED Final   Streptococcus pneumoniae NOT DETECTED NOT DETECTED Final   Streptococcus pyogenes NOT DETECTED NOT DETECTED Final   Acinetobacter baumannii NOT DETECTED NOT DETECTED Final   Enterobacteriaceae species DETECTED (A) NOT DETECTED Final    Comment: Enterobacteriaceae represent a large family of gram-negative bacteria, not a single organism. CRITICAL RESULT CALLED TO, READ BACK BY AND VERIFIED WITH: Denton Brick [facility] [id number] 09/05/18 A BROWNING    Enterobacter cloacae complex NOT DETECTED NOT  DETECTED Final   Escherichia coli NOT DETECTED NOT DETECTED Final   Klebsiella oxytoca NOT DETECTED NOT DETECTED Final   Klebsiella pneumoniae DETECTED (A) NOT DETECTED Final    Comment: CRITICAL RESULT CALLED TO, READ BACK BY AND VERIFIED WITH: Denton Brick PHAR[facility] [id number] 09/05/18 A BROWNING    Proteus species NOT DETECTED NOT DETECTED Final   Serratia marcescens NOT DETECTED NOT DETECTED Final   Carbapenem resistance NOT DETECTED NOT DETECTED Final   Haemophilus influenzae NOT DETECTED NOT DETECTED Final   Neisseria meningitidis NOT DETECTED NOT DETECTED Final   Pseudomonas aeruginosa NOT DETECTED NOT DETECTED Final   Candida albicans NOT DETECTED NOT DETECTED Final   Candida glabrata NOT DETECTED NOT DETECTED Final   Candida krusei NOT DETECTED NOT DETECTED Final   Candida parapsilosis NOT DETECTED NOT DETECTED Final   Candida tropicalis NOT DETECTED NOT DETECTED Final    Comment: Performed at [facility], [place] [address], [place], [place] [address]  Blood Culture (routine x 2)     Status: Abnormal   Collection Time: 09/04/18  2:30 PM  Result Value Ref Range Status   Specimen Description BLOOD BLOOD LEFT WRIST  Final   Special Requests   Final    BOTTLES DRAWN  AEROBIC AND ANAEROBIC Blood Culture results may not be optimal due to an inadequate volume of blood received in culture bottles   Culture  Setup Time   Final    GRAM NEGATIVE RODS AEROBIC BOTTLE ONLY CRITICAL RESULT CALLED TO, READ BACK BY AND VERIFIED WITHDenton Brick [facility] 6433 09/05/18 A BROWNING Performed at [facility], [place] [address] [place], [place] [address]    Culture KLEBSIELLA ORNITHINOLYTICA (A)  Final   Report Status 09/08/2018 FINAL  Final   Organism ID, Bacteria KLEBSIELLA ORNITHINOLYTICA  Final      Susceptibility   Klebsiella ornithinolytica - MIC*    AMPICILLIN >=32 RESISTANT Resistant     CEFAZOLIN >=64 RESISTANT Resistant     CEFEPIME 4 SENSITIVE Sensitive     CEFTAZIDIME 16 INTERMEDIATE Intermediate     CEFTRIAXONE >=64 RESISTANT Resistant     CIPROFLOXACIN >=4 RESISTANT Resistant     GENTAMICIN >=16 RESISTANT Resistant     IMIPENEM <=0.25 SENSITIVE Sensitive     TRIMETH/SULFA >=320 RESISTANT Resistant     AMPICILLIN/SULBACTAM >=32 RESISTANT Resistant     PIP/TAZO 32 INTERMEDIATE Intermediate     * KLEBSIELLA ORNITHINOLYTICA  Culture, blood (Routine X 2) w Reflex to ID Panel     Status: None   Collection Time: 09/06/18  8:12 AM  Result Value Ref Range Status   Specimen Description BLOOD LEFT ANTECUBITAL  Final   Special Requests   Final    BOTTLES DRAWN AEROBIC ONLY Blood Culture adequate volume   Culture   Final    NO GROWTH 5 DAYS Performed at [facility], [address] [place], [place] [address]    Report Status 09/11/2018 FINAL  Final         Radiology Studies: No results found.      Scheduled Meds: . amiodarone  100 mg Oral Daily  . aspirin  81 mg Oral Daily  . clopidogrel  75 mg Oral Q breakfast  . enoxaparin (LOVENOX) injection  40 mg Subcutaneous Q24H  . ezetimibe-simvastatin  1 tablet Oral QHS  . sodium chloride flush  3 mL Intravenous Q12H   Continuous Infusions: . meropenem (MERREM) IV 1 g (09/12/18 0627)      LOS:  8 days    Time spent: 35 minutes    Irine Seal, MD [facility] Pager [phone]  If 7PM-7AM, please contact night-coverage [email or web address] Password [email or web address] 09/12/2018, 10:46 AM

## 2018-09-12 NOTE — Progress Notes (Signed)
Progress Note  Patient Name: William [name] Date of Encounter: 09/12/2018  Primary Cardiologist: Sanda Klein, MD   Subjective   Feeling well, no complaints today.  Inpatient Medications    Scheduled Meds: . amiodarone  100 mg Oral Daily  . aspirin  81 mg Oral Daily  . clopidogrel  75 mg Oral Q breakfast  . enoxaparin (LOVENOX) injection  40 mg Subcutaneous Q24H  . ezetimibe-simvastatin  1 tablet Oral QHS  . potassium chloride  40 mEq Oral Once  . sodium chloride flush  3 mL Intravenous Q12H   Continuous Infusions: . meropenem (MERREM) IV 1 g (09/12/18 0627)   PRN Meds: acetaminophen, polyethylene glycol   Vital Signs    Vitals:   09/11/18 0441 09/11/18 1849 09/11/18 2019 09/12/18 0500  BP: 110/70 101/72 105/71   Pulse: 96 81 94   Resp: 12  15   Temp: 98.1 F (36.7 C) 98.3 F (36.8 C) 97.8 F (36.6 C)   TempSrc: Oral Oral Oral   SpO2: 98% 100% 98%   Weight:    96.2 kg  Height:        Intake/Output Summary (Last 24 hours) at 09/12/2018 0817 Last data filed at 09/12/2018 0515 Gross per 24 hour  Intake 6 ml  Output 1950 ml  Net -1944 ml   Filed Weights   09/09/18 0402 09/10/18 0520 09/12/18 0500  Weight: 98.3 kg 99.4 kg 96.2 kg    Telemetry    Sinus rhythm with PVCs - Personally Reviewed  ECG    No new EKGs - Personally Reviewed  Physical Exam   GEN: Well nourished, well developed, in no acute distress  HEENT: normal  Neck: no JVD, carotid bruits, or masses Cardiac: RRR; no murmurs, rubs, or gallops,no edema  Respiratory:  clear to auscultation bilaterally, normal work of breathing GI: soft, nontender, nondistended, + BS MS: no deformity or atrophy  Skin: warm and dry, device site well healed Neuro:  Strength and sensation are intact Psych: euthymic mood, full affect   Labs    Chemistry Recent Labs  Lab 09/07/18 0303 09/08/18 0719 09/09/18 0826 09/10/18 0317 09/11/18 0356 09/12/18 0247  NA 134* 135 132* 134* 133* 132*    K 3.9 3.5 3.7 3.6 3.8 3.4*  CL 104 105 103 103 104 99  CO2 24 23 21* 21* 22 25  GLUCOSE 126* 129* 126* 115* 130* 121*  BUN 13 12 13 12 13 16   CREATININE 0.83 0.95 0.92 0.92 0.98 0.96  CALCIUM 8.0* 8.1* 8.2* 8.3* 8.5* 8.2*  PROT 5.7* 5.7* 5.6*  --   --   --   ALBUMIN 2.5* 2.4* 2.5*  --   --   --   AST 34 28 26  --   --   --   ALT 66* 47* 42  --   --   --   ALKPHOS 203* 175* 155*  --   --   --   BILITOT 1.2 1.2 1.4*  --   --   --   GFRNONAA >60 >60 >60 >60 >60 >60  GFRAA >60 >60 >60 >60 >60 >60  ANIONGAP 6 7 8 10 7 8      Hematology Recent Labs  Lab 09/09/18 0826 09/10/18 0317 09/11/18 0356  WBC 4.2 4.2 5.1  RBC 3.39* 3.33* 3.41*  HGB 11.4* 11.1* 11.1*  HCT 33.0* 32.2* 32.9*  MCV 97.3 96.7 96.5  MCH 33.6 33.3 32.6  MCHC 34.5 34.5 33.7  RDW 15.8* 15.7* 15.6*  PLT 106* 108* 111*    Cardiac Enzymes No results for input(s): TROPONINI in the last 168 hours. No results for input(s): TROPIPOC in the last 168 hours.   BNPNo results for input(s): BNP, PROBNP in the last 168 hours.   DDimer No results for input(s): DDIMER in the last 168 hours.   Radiology    No results found.  Cardiac Studies   09/04/18; TTE Study Conclusions - Left ventricle: The cavity size was normal. Wall thickness was increased in a pattern of mild LVH. Systolic function was moderately reduced. The estimated ejection fraction was in the range of 35% to 40%. Diffuse hypokinesis. There is akinesis of the inferoseptal myocardium. - Aortic valve: A TAVR bioprosthesis was present. Peak velocity (S): 224 cm/s. Mean gradient (S): 10 mm Hg. Valve area (VTI): 2.63 cm^2. Valve area (Vmax): 2.54 cm^2. Valve area (Vmean): 2.45 cm^2. - Mitral valve: There was mild regurgitation. - Left atrium: The atrium was mildly dilated. - Pulmonary arteries: Systolic pressure was mildly increased. PA peak pressure: 34 mm Hg (S). Impressions: - There was no evidence of a vegetation. Consider TEE if  further sensitivity is needed  Patient Profile     84 y.o. male hx of AAA, NICM, chronic CHF (systolic), VT w/ICD, OSA (not on CPAP), arthritis, CBP/lumbar vertebral fracture, prostate ca w/hx of recurrent UTIs/incontinance, and severe AS recently s/p TAVR admitted with sepsis.  Found with bacteremia KLEBSIELLA ORNITHINOLYTICA, KLEBSIELLA PNEUMONIAE Follow up BC (x1) is neg x 5 days  TEE done 09/08/18 Severe LV dysfunction; severe LAE; s/p AVR; trace AI; moderate (3+) MR; TV not well visualized; oscillating density on ICD lead concerning for vegetation  Device information MDT single chamber ICD implanted 01/11/14 CXR has an abandoned lead, one of the leads is a dual coil ICD lead Hx hx of having sprint Fidelis lead abandoned looks like implant of this lead was 2005  Assessment & Plan     1. ESBL kelbsiella bacteremia with ICD infection     Meropenem per ID. Jeferson Boozer need PICC line after device extraction. Repeat cultures negative to date. Plan for device extraction Tuesday.      2. TEE with what appears a vegitation on device lead 3. Hx of NICM w/VT     Abandoned Sprint Fidelis lead from 2005      New lead and generator 2015     No Hx CABG     Recent TAVR 4. Dementia     Takes time to clear upon waking but pleasant and redirectable.    For questions or updates, please contact [place] Please consult [email or web address] for contact info under        Signed, Monik Lins Meredith Leeds, MD  09/12/2018, 8:17 AM

## 2018-09-13 LAB — CBC
HEMATOCRIT: 32.7 % — AB (ref 39.0–52.0)
Hemoglobin: 11.3 g/dL — ABNORMAL LOW (ref 13.0–17.0)
MCH: 33 pg (ref 26.0–34.0)
MCHC: 34.6 g/dL (ref 30.0–36.0)
MCV: 95.6 fL (ref 80.0–100.0)
NRBC: 0 % (ref 0.0–0.2)
Platelets: 119 10*3/uL — ABNORMAL LOW (ref 150–400)
RBC: 3.42 MIL/uL — AB (ref 4.22–5.81)
RDW: 15.7 % — ABNORMAL HIGH (ref 11.5–15.5)
WBC: 4.9 10*3/uL (ref 4.0–10.5)

## 2018-09-13 LAB — BASIC METABOLIC PANEL
Anion gap: 10 (ref 5–15)
BUN: 20 mg/dL (ref 8–23)
CHLORIDE: 100 mmol/L (ref 98–111)
CO2: 21 mmol/L — AB (ref 22–32)
CREATININE: 1.07 mg/dL (ref 0.61–1.24)
Calcium: 8.2 mg/dL — ABNORMAL LOW (ref 8.9–10.3)
GFR calc Af Amer: >60 mL/min (ref >60)
GFR calc non Af Amer: >60 mL/min (ref >60)
GLUCOSE: 124 mg/dL — AB (ref 70–99)
POTASSIUM: 4 mmol/L (ref 3.5–5.1)
Sodium: 131 mmol/L — ABNORMAL LOW (ref 135–145)

## 2018-09-13 NOTE — Progress Notes (Signed)
Pharmacy Antibiotic Note  [name] is a 84 y.o. 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 Plans noted for ICD extraction on 12/3 with plans for 6 weeks antibiotics from that time. -WBC= 4.9, afeb, CrCl ~55   Plan: -Continue Merrem 1gm IV Q8H -Will follow renal function, cultures and clinical progress    Height: 5\' 10"  (177.8 cm) Weight: 207 lb 7.3 oz (94.1 kg) IBW/kg (Calculated) : 73  Temp (24hrs), Avg:97.9 F (36.6 C), Min:97.5 F (36.4 C), Max:98.4 F (36.9 C)  Recent Labs  Lab 09/08/18 0719 09/09/18 0826 09/10/18 0317 09/11/18 0356 09/12/18 0247 09/13/18 0253  WBC 3.8* 4.2 4.2 5.1  --  4.9  CREATININE 0.95 0.92 0.92 0.98 0.96 1.07    Estimated Creatinine Clearance: 54.9 mL/min (by C-G formula based on SCr of 1.07 mg/dL).    Allergies  Allergen Reactions  . Crestor [Rosuvastatin] Other (See Comments)    Hurts muscles  . Lipitor [Atorvastatin] Other (See Comments)    Hurts stomach     Cefepime 11/22 x1 CTX 11/23 >> 11/25 Vanc 11/22 >> 11/23 Flagyl 11/22 >> 11/23 Merrem 11/25 >>  11/22 BCx - ESBL Kleb pneumo/ornithinolytica (BCID Kleb pneumo)  11/23 Flu A/B PCR - negative 11/24 BCx - NGTD   Hildred Laser, PharmD Clinical Pharmacist **Pharmacist phone directory can now be found on [email or web address] ([email or web address]).  Listed under [place].

## 2018-09-13 NOTE — Progress Notes (Signed)
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  Patient was recently admitted from 08/07/2018-08/13/2018 for septic shock secondary to Klebsiella UTI. He was admitted again from 08/18/2018-08/20/2018 for successful TAVR procedure.  In the ED he was found to have a pulse of 120, RR 26, temp 101.45F. Urinalysis was negative. CXR showed a right basilar opacity suggestive of atelectasis versus airspace disease/pneumonia. CT abdomen/pelvis with contrast showed uncomplicated cholelithiasis, 4.4 cm infrarenal AAA, bilateral simple and complex renal cyst, and colonic diverticulosis without acute diverticulitis.   Assessment & Plan:   Principal Problem:   Sepsis ([place]) Active Problems:   Nonischemic cardiomyopathy (HCC)   Hyperlipidemia   S/P ICD (internal cardiac defibrillator) procedure, 01/11/14 removal of ERI gen and placement of Medtronic Evera XT VR & NEW Right ventricular lead Medtronic   Ventricular tachycardia (HCC)   S/P TAVR (transcatheter aortic valve replacement)   Bacteremia due to Klebsiella pneumoniae   Acute bacterial endocarditis   Infected defibrillator (HCC)  1 ESBL Klebsiella bacteremia/pacemaker lead vegetation/endocarditis Patient presented with fevers and chills blood cultures positive for ESBL Klebsiella bacteremia.  Patient underwent 2D echo which showed a EF of 35 to 40%, diffuse hypokinesis, akinesis of the inferior septal myocardium, TAVR bioprosthesis present with no evidence of vegetation.  Patient status post TEE on 09/08/2018 concerning for vegetation to lead.  Patient seen in consultation by ID who  are recommending 6 weeks of IV meropenem.  Repeat blood cultures done 09/06/2018 with no growth to date.  Patient seen by cardiology, cardiothoracic surgery as well as the EP team and patient for device extraction on Tuesday, 09/15/2018.  Per ID continue IV antibiotics until lead extraction and then will need a total of 6 weeks of IV antibiotics post lead extraction.  PICC line to be placed post lead extraction.  ID and EP cardiology following.  Follow.  2.  Severe aortic stenosis status post TAVR 08/18/2018 Valve team following.  3.  Hypotension Hypotension improved.  Systolic blood pressure now 119/91.  IV fluids have been saline locked.  Follow.   4.  Chronic systolic heart failure/nonischemic cardiomyopathy Patient noted per 2D echo of 09/04/2018 12 a EF of 35 to 40%.  Patient currently euvolemic on examination.  Current weight is 94.1 kg from 96.2 kg from Weight on 09/10/2018 was 99.4 kg from 98.3 kg from 98.3 kg from 97.9 kg from 100.4 kg.  Monitor volume status closely.  Saline lock IV fluids.  Patient with a urine output of 1.140 L over the past 24 hours.  Per cardiology.  5.  Hyperlipidemia Stable.  Continue Vytorin.  Outpatient follow-up.   6.  Infrarenal AAA Patient with a 4.4 cm infrarenal fusiform AAA seen on CT abdomen and pelvis.  Followed by T CTS.  7.  History of ventricular tachycardia status post ICD Continue amiodarone.  Patient has been evaluated by EP and patient for device extraction early this coming week 09/15/2018. Per EP.  8.  Anemia Patient with no overt bleeding.  Hemoglobin currently stable at 11.3.  Follow H&H.  Transfusion threshold hemoglobin less than 7.  9.  Hypokalemia Repleted.   DVT prophylaxis: Lovenox Code Status: Full Family Communication: Updated patient and daughter  at bedside.  Disposition Plan: Remain on telemetry.  CIR when okay with cardiology.     Consultants:   Cardiothoracic surgery: Dr. Alvan Dame 09/04/2018  Infectious disease: Dr.  Johnnye Sima 09/05/2018  Cardiology/valve team Dr. Angelena Form 09/08/2018  Cardiology: Dr. Debara Pickett 09/06/2018  Electrophysiology: Dr. Lovena Le 09/09/2018  Procedures:   CT abdomen and pelvis 09/04/2018  Chest x-ray 09/04/2018  2D echo 09/04/2018  Transesophageal echocardiogram 09/08/2018--- severe global reduction in LV systolic function: Status post AVR with trace periprosthetic AI, moderate MR,severe LAE, oscillating density on pacer wire concerning for vegetation.--- Per Dr. Stanford Breed  Antimicrobials:   IV cefepime 09/04/2018>>>> 09/05/2018  IV Rocephin 09/05/2018>>>>> 09/07/2018  IV Merrem 09/07/2018  IV vancomycin 09/04/2018>>>>> 09/05/2018   Subjective: Denies chest pain.  Denies shortness of breath.  Denies abdominal pain.  Daughter at bedside.   Objective: Vitals:   09/12/18 1721 09/12/18 1940 09/13/18 0429 09/13/18 0909  BP: 109/73 101/67 104/73 (!) 119/91  Pulse: 96 95 (!) 110 (!) 36  Resp: 20 (!) 24 20 19   Temp: (!) 97.5 F (36.4 C) 98.4 F (36.9 C) 97.6 F (36.4 C) 98.1 F (36.7 C)  TempSrc: Oral Oral Oral Oral  SpO2:  100% 100% 95%  Weight:   94.1 kg   Height:        Intake/Output Summary (Last 24 hours) at 09/13/2018 1018 Last data filed at 09/12/2018 1919 Gross per 24 hour  Intake 220 ml  Output 1140 ml  Net -920 ml   Filed Weights   09/10/18 0520 09/12/18 0500 09/13/18 0429  Weight: 99.4 kg 96.2 kg 94.1 kg    Examination:  General exam: NAD.   Respiratory system: CTAB.  No wheezes, no crackles, no rhonchi.  Normal respiratory effort.   Cardiovascular system: Regular rate rhythm no murmurs rubs or gallops.  No JVD.  Trace bilateral lower extremity edema.  Gastrointestinal system: Abdomen is nontender, nondistended, soft, positive bowel sounds.  No rebound.  No guarding.  Central nervous system: Alert and oriented. No focal neurological deficits. Extremities: Trace bilateral lower extremity edema.  Symmetric 5 x 5 power. Skin: No rashes, lesions or  ulcers Psychiatry: Judgement and insight appear fair. Mood & affect appropriate.     Data Reviewed: I have personally reviewed following labs and imaging studies  CBC: Recent Labs  Lab 09/08/18 0719 09/09/18 0826 09/10/18 0317 09/11/18 0356 09/13/18 0253  WBC 3.8* 4.2 4.2 5.1 4.9  NEUTROABS  --  2.7 2.4 3.1  --   HGB 11.0* 11.4* 11.1* 11.1* 11.3*  HCT 31.9* 33.0* 32.2* 32.9* 32.7*  MCV 96.4 97.3 96.7 96.5 95.6  PLT 97* 106* 108* 111* 063*   Basic Metabolic Panel: Recent Labs  Lab 09/08/18 0719 09/09/18 0826 09/10/18 0317 09/11/18 0356 09/12/18 0247 09/13/18 0253  NA 135 132* 134* 133* 132* 131*  K 3.5 3.7 3.6 3.8 3.4* 4.0  CL 105 103 103 104 99 100  CO2 23 21* 21* 22 25 21*  GLUCOSE 129* 126* 115* 130* 121* 124*  BUN 12 13 12 13 16 20   CREATININE 0.95 0.92 0.92 0.98 0.96 1.07  CALCIUM 8.1* 8.2* 8.3* 8.5* 8.2* 8.2*  MG 2.3 2.2  --   --   --   --    GFR: Estimated Creatinine Clearance: 54.9 mL/min (by C-G formula based on SCr of 1.07 mg/dL). Liver Function Tests: Recent Labs  Lab 09/07/18 0303 09/08/18 0719 09/09/18 0826  AST 34 28 26  ALT 66* 47* 42  ALKPHOS 203* 175* 155*  BILITOT 1.2  1.2 1.4*  PROT 5.7* 5.7* 5.6*  ALBUMIN 2.5* 2.4* 2.5*   No results for input(s): LIPASE, AMYLASE in the last 168 hours. No results for input(s): AMMONIA in the last 168 hours. Coagulation Profile: No results for input(s): INR, PROTIME in the last 168 hours. Cardiac Enzymes: No results for input(s): CKTOTAL, CKMB, CKMBINDEX, TROPONINI in the last 168 hours. BNP (last 3 results) No results for input(s): PROBNP in the last 8760 hours. HbA1C: No results for input(s): HGBA1C in the last 72 hours. CBG: No results for input(s): GLUCAP in the last 168 hours. Lipid Profile: No results for input(s): CHOL, HDL, LDLCALC, TRIG, CHOLHDL, LDLDIRECT in the last 72 hours. Thyroid Function Tests: No results for input(s): TSH, T4TOTAL, FREET4, T3FREE, THYROIDAB in the last 72  hours. Anemia Panel: No results for input(s): VITAMINB12, FOLATE, FERRITIN, TIBC, IRON, RETICCTPCT in the last 72 hours. Sepsis Labs: No results for input(s): PROCALCITON, LATICACIDVEN in the last 168 hours.  Recent Results (from the past 240 hour(s))  Blood Culture (routine x 2)     Status: Abnormal   Collection Time: 09/04/18  2:25 PM  Result Value Ref Range Status   Specimen Description BLOOD BLOOD RIGHT FOREARM  Final   Special Requests   Final    BOTTLES DRAWN AEROBIC AND ANAEROBIC Blood Culture adequate volume   Culture  Setup Time   Final    GRAM NEGATIVE RODS IN BOTH AEROBIC AND ANAEROBIC BOTTLES CRITICAL RESULT CALLED TO, READ BACK BY AND VERIFIED WITHDenton Brick [facility] 6213 09/05/18 A BROWNING Performed at [facility], [place] [address], [place], [place] [address]    Culture (A)  Final    KLEBSIELLA PNEUMONIAE Confirmed Extended Spectrum Beta-Lactamase Producer (ESBL).  In bloodstream infections from ESBL organisms, carbapenems are preferred over piperacillin/tazobactam. They are shown to have a lower risk of mortality.    Report Status 09/07/2018 FINAL  Final   Organism ID, Bacteria KLEBSIELLA PNEUMONIAE  Final      Susceptibility   Klebsiella pneumoniae - MIC*    AMPICILLIN >=32 RESISTANT Resistant     CEFAZOLIN >=64 RESISTANT Resistant     CEFEPIME RESISTANT Resistant     CEFTAZIDIME 32 RESISTANT Resistant     CEFTRIAXONE >=64 RESISTANT Resistant     CIPROFLOXACIN >=4 RESISTANT Resistant     GENTAMICIN >=16 RESISTANT Resistant     IMIPENEM <=0.25 SENSITIVE Sensitive     TRIMETH/SULFA >=320 RESISTANT Resistant     AMPICILLIN/SULBACTAM >=32 RESISTANT Resistant     PIP/TAZO 32 INTERMEDIATE Intermediate     Extended ESBL POSITIVE Resistant     * KLEBSIELLA PNEUMONIAE  Blood Culture ID Panel (Reflexed)     Status: Abnormal   Collection Time: 09/04/18  2:25 PM  Result Value Ref Range Status   Enterococcus species NOT DETECTED NOT DETECTED Final   Listeria  monocytogenes NOT DETECTED NOT DETECTED Final   Staphylococcus species NOT DETECTED NOT DETECTED Final   Staphylococcus aureus (BCID) NOT DETECTED NOT DETECTED Final   Streptococcus species NOT DETECTED NOT DETECTED Final   Streptococcus agalactiae NOT DETECTED NOT DETECTED Final   Streptococcus pneumoniae NOT DETECTED NOT DETECTED Final   Streptococcus pyogenes NOT DETECTED NOT DETECTED Final   Acinetobacter baumannii NOT DETECTED NOT DETECTED Final   Enterobacteriaceae species DETECTED (A) NOT DETECTED Final    Comment: Enterobacteriaceae represent a large family of gram-negative bacteria, not a single organism. CRITICAL RESULT CALLED TO, READ BACK BY AND VERIFIED WITHDenton Brick [facility] 8469 09/05/18 A BROWNING  Enterobacter cloacae complex NOT DETECTED NOT DETECTED Final   Escherichia coli NOT DETECTED NOT DETECTED Final   Klebsiella oxytoca NOT DETECTED NOT DETECTED Final   Klebsiella pneumoniae DETECTED (A) NOT DETECTED Final    Comment: CRITICAL RESULT CALLED TO, READ BACK BY AND VERIFIED WITH: Denton Brick [facility] [id number] 09/05/18 A BROWNING    Proteus species NOT DETECTED NOT DETECTED Final   Serratia marcescens NOT DETECTED NOT DETECTED Final   Carbapenem resistance NOT DETECTED NOT DETECTED Final   Haemophilus influenzae NOT DETECTED NOT DETECTED Final   Neisseria meningitidis NOT DETECTED NOT DETECTED Final   Pseudomonas aeruginosa NOT DETECTED NOT DETECTED Final   Candida albicans NOT DETECTED NOT DETECTED Final   Candida glabrata NOT DETECTED NOT DETECTED Final   Candida krusei NOT DETECTED NOT DETECTED Final   Candida parapsilosis NOT DETECTED NOT DETECTED Final   Candida tropicalis NOT DETECTED NOT DETECTED Final    Comment: Performed at [facility], [place] [address] [place], [place] [address]  Blood Culture (routine x 2)     Status: Abnormal   Collection Time: 09/04/18  2:30 PM  Result Value Ref Range Status   Specimen Description BLOOD BLOOD LEFT WRIST  Final    Special Requests   Final    BOTTLES DRAWN AEROBIC AND ANAEROBIC Blood Culture results may not be optimal due to an inadequate volume of blood received in culture bottles   Culture  Setup Time   Final    GRAM NEGATIVE RODS AEROBIC BOTTLE ONLY CRITICAL RESULT CALLED TO, READ BACK BY AND VERIFIED WITHDenton Brick [facility] 2637 09/05/18 A BROWNING Performed at [facility], [place] [address], [place], [place] [address]    Culture KLEBSIELLA ORNITHINOLYTICA (A)  Final   Report Status 09/08/2018 FINAL  Final   Organism ID, Bacteria KLEBSIELLA ORNITHINOLYTICA  Final      Susceptibility   Klebsiella ornithinolytica - MIC*    AMPICILLIN >=32 RESISTANT Resistant     CEFAZOLIN >=64 RESISTANT Resistant     CEFEPIME 4 SENSITIVE Sensitive     CEFTAZIDIME 16 INTERMEDIATE Intermediate     CEFTRIAXONE >=64 RESISTANT Resistant     CIPROFLOXACIN >=4 RESISTANT Resistant     GENTAMICIN >=16 RESISTANT Resistant     IMIPENEM <=0.25 SENSITIVE Sensitive     TRIMETH/SULFA >=320 RESISTANT Resistant     AMPICILLIN/SULBACTAM >=32 RESISTANT Resistant     PIP/TAZO 32 INTERMEDIATE Intermediate     * KLEBSIELLA ORNITHINOLYTICA  Culture, blood (Routine X 2) w Reflex to ID Panel     Status: None   Collection Time: 09/06/18  8:12 AM  Result Value Ref Range Status   Specimen Description BLOOD LEFT ANTECUBITAL  Final   Special Requests   Final    BOTTLES DRAWN AEROBIC ONLY Blood Culture adequate volume   Culture   Final    NO GROWTH 5 DAYS Performed at [facility], [address] [place], [place] [address]    Report Status 09/11/2018 FINAL  Final         Radiology Studies: No results found.      Scheduled Meds: . amiodarone  100 mg Oral Daily  . aspirin  81 mg Oral Daily  . clopidogrel  75 mg Oral Q breakfast  . enoxaparin (LOVENOX) injection  40 mg Subcutaneous Q24H  . ezetimibe-simvastatin  1 tablet Oral QHS  . sodium chloride flush  3 mL Intravenous Q12H   Continuous Infusions: .  meropenem (MERREM) IV 1 g (09/13/18  4497)     LOS: 9 days    Time spent: 35 minutes    Irine Seal, MD [facility] Pager [phone]  If 7PM-7AM, please contact night-coverage [email or web address] Password [email or web address] 09/13/2018, 10:18 AM

## 2018-09-14 ENCOUNTER — Telehealth: Payer: Self-pay | Admitting: *Deleted

## 2018-09-14 DIAGNOSIS — Z1612 Extended spectrum beta lactamase (ESBL) resistance: Secondary | ICD-10-CM

## 2018-09-14 LAB — CBC WITH DIFFERENTIAL/PLATELET
ABS IMMATURE GRANULOCYTES: 0.02 10*3/uL (ref 0.00–0.07)
BASOS ABS: 0 10*3/uL (ref 0.0–0.1)
Basophils Relative: 1 %
Eosinophils Absolute: 0.6 10*3/uL — ABNORMAL HIGH (ref 0.0–0.5)
Eosinophils Relative: 12 %
HCT: 33 % — ABNORMAL LOW (ref 39.0–52.0)
HEMOGLOBIN: 11.3 g/dL — AB (ref 13.0–17.0)
Immature Granulocytes: 0 %
LYMPHS PCT: 22 %
Lymphs Abs: 1.1 10*3/uL (ref 0.7–4.0)
MCH: 32.7 pg (ref 26.0–34.0)
MCHC: 34.2 g/dL (ref 30.0–36.0)
MCV: 95.4 fL (ref 80.0–100.0)
Monocytes Absolute: 0.4 10*3/uL (ref 0.1–1.0)
Monocytes Relative: 9 %
NEUTROS ABS: 2.7 10*3/uL (ref 1.7–7.7)
NRBC: 0 % (ref 0.0–0.2)
Neutrophils Relative %: 56 %
Platelets: 120 10*3/uL — ABNORMAL LOW (ref 150–400)
RBC: 3.46 MIL/uL — AB (ref 4.22–5.81)
RDW: 15.6 % — AB (ref 11.5–15.5)
WBC: 4.8 10*3/uL (ref 4.0–10.5)

## 2018-09-14 LAB — BASIC METABOLIC PANEL
Anion gap: 8 (ref 5–15)
BUN: 20 mg/dL (ref 8–23)
CALCIUM: 8.3 mg/dL — AB (ref 8.9–10.3)
CO2: 24 mmol/L (ref 22–32)
CREATININE: 0.91 mg/dL (ref 0.61–1.24)
Chloride: 100 mmol/L (ref 98–111)
GFR calc Af Amer: >60 mL/min (ref >60)
GFR calc non Af Amer: >60 mL/min (ref >60)
Glucose, Bld: 120 mg/dL — ABNORMAL HIGH (ref 70–99)
Potassium: 3.7 mmol/L (ref 3.5–5.1)
SODIUM: 132 mmol/L — AB (ref 135–145)

## 2018-09-14 LAB — SURGICAL PCR SCREEN
MRSA, PCR: NEGATIVE
Staphylococcus aureus: NEGATIVE

## 2018-09-14 MED ORDER — CHLORHEXIDINE GLUCONATE 4 % EX LIQD
60.0000 mL | Freq: Once | CUTANEOUS | Status: AC
  Administered 2018-09-15: 4 via TOPICAL
  Filled 2018-09-14: qty 60

## 2018-09-14 MED ORDER — SODIUM CHLORIDE 0.9 % IV SOLN
INTRAVENOUS | Status: DC
  Administered 2018-09-15 (×2): via INTRAVENOUS

## 2018-09-14 MED ORDER — SODIUM CHLORIDE 0.9 % IV SOLN
80.0000 mg | INTRAVENOUS | Status: AC
  Filled 2018-09-14: qty 2

## 2018-09-14 MED ORDER — CEFAZOLIN SODIUM-DEXTROSE 2-4 GM/100ML-% IV SOLN
2.0000 g | INTRAVENOUS | Status: AC
  Filled 2018-09-14: qty 100

## 2018-09-14 MED ORDER — CHLORHEXIDINE GLUCONATE 4 % EX LIQD
60.0000 mL | Freq: Once | CUTANEOUS | Status: AC
  Administered 2018-09-14: 4 via TOPICAL
  Filled 2018-09-14: qty 60

## 2018-09-14 NOTE — Progress Notes (Signed)
IP rehab admissions - I am following for potential acute inpatient rehab admission.  Please see rehab consult done by Dr. Letta Pate on 09/11/18.  I will follow up with patient after lead removal.  Call me for questions.  [phone]

## 2018-09-14 NOTE — Progress Notes (Signed)
Physical Therapy Treatment Patient Details Name: William [name] MRN: [id number] DOB: January 26, 1928 Today's Date: 09/14/2018    History of Present Illness William is a 84 y.o. M with significant PMH of chronic systolic EF, ICM, severe AS s/p TAVR 11/5, hx of ventricular tachycardia s/p ICD, AAA, hyperlipidemia, prostate CA with recurrent UTI, who presents with sudden onset of fever, chills, fatigue. Recent admits 10/25-10/31 for septic shock secondary to UTI. Admitted 11/5-11/7 for TAVR. Concern for TAVR infection from previous UTI.     William Comments    Patient seen for activity progression in conjunction with OT for functional task performance. Patient tolerated 2 bouts of ambulation this session in addition to transfer training and functional tasks. At this time, continue to feel current POC remains appropriate. Tentative plan for OR tomorrow, will reassess post operatively.  Follow Up Recommendations  CIR     Equipment Recommendations  Other (comment)(defer)    Recommendations for Other Services Rehab consult     Precautions / Restrictions Precautions Precautions: Fall Required Braces or Orthoses: Other Brace Other Brace: Right wrist splint from recent wrist dislocation per William family; but can use walker with splint on per family Restrictions Weight Bearing Restrictions: No    Mobility  Bed Mobility Overal bed mobility: Needs Assistance Bed Mobility: Supine to Sit     Supine to sit: Mod assist;+2 for physical assistance;HOB elevated     General bed mobility comments: Increased time and effort, max multi modal cues for performance  Transfers Overall transfer level: Needs assistance Equipment used: Rolling walker (2 wheeled) Transfers: Sit to/from Stand Sit to Stand: Min assist;+2 physical assistance         General transfer comment: +2 min assist to power up to standing with RW utilizing specific verbal cue of counting " 1-2-3 stand" increased time and effort, performed x3  during session  Ambulation/Gait Ambulation/Gait assistance: +2 physical assistance;Mod assist Gait Distance (Feet): 12 Feet(x2 with one seated rest break) Assistive device: Rolling walker (2 wheeled) Gait Pattern/deviations: Step-to pattern;Decreased step length - right;Decreased step length - left;Shuffle;Trunk flexed;Drifts right/left Gait velocity: decr Gait velocity interpretation: <1.31 ft/sec, indicative of household ambulator General Gait Details: Performed x2 with seated rest break, max multi modal cues for posture and positioning with RW, heavy trunk flexion with distance requiring assist bilaterally to stabilize   Stairs             Wheelchair Mobility    Modified Rankin (Stroke Patients Only)       Balance Overall balance assessment: Needs assistance Sitting-balance support: Feet supported;Bilateral upper extremity supported Sitting balance-Leahy Scale: Poor Sitting balance - Comments: posterior lean with BUE support Postural control: Other (comment)(anterior) Standing balance support: Bilateral upper extremity supported;During functional activity Standing balance-Leahy Scale: Poor Standing balance comment: heavy reliance on RW for upright support and increased external support                            Cognition Arousal/Alertness: Awake/alert Behavior During Therapy: WFL for tasks assessed/performed Overall Cognitive Status: History of cognitive impairments - at baseline                                 General Comments: Baseline dementia. Needs repeated verbal/tactile cues. Very poor safety awareness      Exercises      General Comments        Pertinent Vitals/Pain Pain Assessment:  Faces Faces Pain Scale: Hurts a little bit Pain Location: general Pain Descriptors / Indicators: Grimacing Pain Intervention(s): Monitored during session    Home Living                      Prior Function            William Goals  (current goals can now be found in the care plan section) Acute Rehab William Goals Patient Stated Goal: William family wants him up in the chair William Goal Formulation: With patient Time For Goal Achievement: 09/20/18 Potential to Achieve Goals: Fair Progress towards William goals: Progressing toward goals    Frequency    Min 3X/week      William Plan Current plan remains appropriate    Co-evaluation William/OT/SLP Co-Evaluation/Treatment: Yes Reason for Co-Treatment: Complexity of the patient's impairments (multi-system involvement);Necessary to address cognition/behavior during functional activity;For patient/therapist safety;To address functional/ADL transfers William goals addressed during session: Mobility/safety with mobility OT goals addressed during session: ADL's and self-care      AM-PAC William "6 Clicks" Mobility   Outcome Measure  Help needed turning from your back to your side while in a flat bed without using bedrails?: A Lot Help needed moving from lying on your back to sitting on the side of a flat bed without using bedrails?: A Lot Help needed moving to and from a bed to a chair (including a wheelchair)?: A Lot Help needed standing up from a chair using your arms (e.g., wheelchair or bedside chair)?: A Lot Help needed to walk in hospital room?: A Lot Help needed climbing 3-5 steps with a railing? : Total 6 Click Score: 11    End of Session Equipment Utilized During Treatment: Gait belt Activity Tolerance: Patient limited by fatigue Patient left: in chair;with call bell/phone within reach;with chair alarm set Nurse Communication: Mobility status William Visit Diagnosis: Unsteadiness on feet (R26.81);Other abnormalities of gait and mobility (R26.89);Muscle weakness (generalized) (M62.81)     Time: 9518-8416 William Time Calculation (min) (ACUTE ONLY): 26 min  Charges:  $Gait Training: 8-22 mins                     William [name], William [name]  Board Certified Neurologic Specialist [place] Pager [phone] Office [phone]    William [name] 09/14/2018, 9:42 AM

## 2018-09-14 NOTE — Telephone Encounter (Signed)
Thanks Sharyn Lull  Will see her when I round

## 2018-09-14 NOTE — Progress Notes (Signed)
INFECTIOUS DISEASE PROGRESS NOTE  ID: William [name] is a 84 y.o. male with  Principal Problem:   Sepsis ([place]) Active Problems:   Nonischemic cardiomyopathy ([place])   Hyperlipidemia   S/P ICD (internal cardiac defibrillator) procedure, 01/11/14 removal of ERI gen and placement of Medtronic Evera XT VR & NEW Right ventricular lead Medtronic   Ventricular tachycardia (HCC)   S/P TAVR (transcatheter aortic valve replacement)   Bacteremia due to Klebsiella pneumoniae   Acute bacterial endocarditis   Infected defibrillator (HCC)  Subjective: Up with PT.   Abtx:  Anti-infectives (From admission, onward)   Start     Dose/Rate Route Frequency Ordered Stop   09/07/18 1300  meropenem (MERREM) 1 g in sodium chloride 0.9 % 100 mL IVPB     1 g 200 mL/hr over 30 Minutes Intravenous Every 8 hours 09/07/18 1232     09/05/18 1700  ceFEPIme (MAXIPIME) 2 g in sodium chloride 0.9 % 100 mL IVPB  Status:  Discontinued     2 g 200 mL/hr over 30 Minutes Intravenous Every 24 hours 09/04/18 1537 09/05/18 0619   09/05/18 1700  vancomycin (VANCOCIN) 1,250 mg in sodium chloride 0.9 % 250 mL IVPB  Status:  Discontinued     1,250 mg 166.7 mL/hr over 90 Minutes Intravenous Every 24 hours 09/04/18 1537 09/05/18 0619   09/05/18 0800  cefTRIAXone (ROCEPHIN) 2 g in sodium chloride 0.9 % 100 mL IVPB  Status:  Discontinued     2 g 200 mL/hr over 30 Minutes Intravenous Every 24 hours 09/05/18 0619 09/07/18 1232   09/04/18 1445  vancomycin (VANCOCIN) 2,000 mg in sodium chloride 0.9 % 500 mL IVPB     2,000 mg 250 mL/hr over 120 Minutes Intravenous  Once 09/04/18 1436 09/04/18 2148   09/04/18 1430  ceFEPIme (MAXIPIME) 2 g in sodium chloride 0.9 % 100 mL IVPB     2 g 200 mL/hr over 30 Minutes Intravenous  Once 09/04/18 1425 09/04/18 1657   09/04/18 1430  metroNIDAZOLE (FLAGYL) IVPB 500 mg  Status:  Discontinued     500 mg 100 mL/hr over 60 Minutes Intravenous Every 8 hours 09/04/18 1425 09/05/18 1544   09/04/18  1430  vancomycin (VANCOCIN) IVPB 1000 mg/200 mL premix  Status:  Discontinued     1,000 mg 200 mL/hr over 60 Minutes Intravenous  Once 09/04/18 1425 09/04/18 1436      Medications:  Scheduled: . amiodarone  100 mg Oral Daily  . aspirin  81 mg Oral Daily  . clopidogrel  75 mg Oral Q breakfast  . enoxaparin (LOVENOX) injection  40 mg Subcutaneous Q24H  . ezetimibe-simvastatin  1 tablet Oral QHS  . sodium chloride flush  3 mL Intravenous Q12H    Objective: Vital signs in last 24 hours: Temp:  [97.9 F (36.6 C)-98.1 F (36.7 C)] 97.9 F (36.6 C) (12/02 0555) Pulse Rate:  [36] 36 (12/01 0909) Resp:  [19-22] 20 (12/02 0555) BP: (104-119)/(53-91) 107/53 (12/02 0555) SpO2:  [95 %-98 %] 98 % (12/02 0555) Weight:  [95.7 kg] 95.7 kg (12/02 0555)   General appearance: alert, cooperative and no distress  Lab Results Recent Labs    09/13/18 0253 09/14/18 0308  WBC 4.9 4.8  HGB 11.3* 11.3*  HCT 32.7* 33.0*  NA 131* 132*  K 4.0 3.7  CL 100 100  CO2 21* 24  BUN 20 20  CREATININE 1.07 0.91   Liver Panel No results for input(s): PROT, ALBUMIN, AST, ALT, ALKPHOS, BILITOT, BILIDIR,  IBILI in the last 72 hours. Sedimentation Rate No results for input(s): ESRSEDRATE in the last 72 hours. C-Reactive Protein No results for input(s): CRP in the last 72 hours.  Microbiology: Recent Results (from the past 240 hour(s))  Blood Culture (routine x 2)     Status: Abnormal   Collection Time: 09/04/18  2:25 PM  Result Value Ref Range Status   Specimen Description BLOOD BLOOD RIGHT FOREARM  Final   Special Requests   Final    BOTTLES DRAWN AEROBIC AND ANAEROBIC Blood Culture adequate volume   Culture  Setup Time   Final    GRAM NEGATIVE RODS IN BOTH AEROBIC AND ANAEROBIC BOTTLES CRITICAL RESULT CALLED TO, READ BACK BY AND VERIFIED WITHDenton Brick [facility] 2376 09/05/18 A BROWNING Performed at [facility], [place] [address] [place], [place] [address]    Culture (A)  Final     KLEBSIELLA PNEUMONIAE Confirmed Extended Spectrum Beta-Lactamase Producer (ESBL).  In bloodstream infections from ESBL organisms, carbapenems are preferred over piperacillin/tazobactam. They are shown to have a lower risk of mortality.    Report Status 09/07/2018 FINAL  Final   Organism ID, Bacteria KLEBSIELLA PNEUMONIAE  Final      Susceptibility   Klebsiella pneumoniae - MIC*    AMPICILLIN >=32 RESISTANT Resistant     CEFAZOLIN >=64 RESISTANT Resistant     CEFEPIME RESISTANT Resistant     CEFTAZIDIME 32 RESISTANT Resistant     CEFTRIAXONE >=64 RESISTANT Resistant     CIPROFLOXACIN >=4 RESISTANT Resistant     GENTAMICIN >=16 RESISTANT Resistant     IMIPENEM <=0.25 SENSITIVE Sensitive     TRIMETH/SULFA >=320 RESISTANT Resistant     AMPICILLIN/SULBACTAM >=32 RESISTANT Resistant     PIP/TAZO 32 INTERMEDIATE Intermediate     Extended ESBL POSITIVE Resistant     * KLEBSIELLA PNEUMONIAE  Blood Culture ID Panel (Reflexed)     Status: Abnormal   Collection Time: 09/04/18  2:25 PM  Result Value Ref Range Status   Enterococcus species NOT DETECTED NOT DETECTED Final   Listeria monocytogenes NOT DETECTED NOT DETECTED Final   Staphylococcus species NOT DETECTED NOT DETECTED Final   Staphylococcus aureus (BCID) NOT DETECTED NOT DETECTED Final   Streptococcus species NOT DETECTED NOT DETECTED Final   Streptococcus agalactiae NOT DETECTED NOT DETECTED Final   Streptococcus pneumoniae NOT DETECTED NOT DETECTED Final   Streptococcus pyogenes NOT DETECTED NOT DETECTED Final   Acinetobacter baumannii NOT DETECTED NOT DETECTED Final   Enterobacteriaceae species DETECTED (A) NOT DETECTED Final    Comment: Enterobacteriaceae represent a large family of gram-negative bacteria, not a single organism. CRITICAL RESULT CALLED TO, READ BACK BY AND VERIFIED WITH: Denton Brick PHARMD [id number] 09/05/18 A BROWNING    Enterobacter cloacae complex NOT DETECTED NOT DETECTED Final   Escherichia coli NOT DETECTED NOT  DETECTED Final   Klebsiella oxytoca NOT DETECTED NOT DETECTED Final   Klebsiella pneumoniae DETECTED (A) NOT DETECTED Final    Comment: CRITICAL RESULT CALLED TO, READ BACK BY AND VERIFIED WITH: Denton Brick PHARMD 6073 09/05/18 A BROWNING    Proteus species NOT DETECTED NOT DETECTED Final   Serratia marcescens NOT DETECTED NOT DETECTED Final   Carbapenem resistance NOT DETECTED NOT DETECTED Final   Haemophilus influenzae NOT DETECTED NOT DETECTED Final   Neisseria meningitidis NOT DETECTED NOT DETECTED Final   Pseudomonas aeruginosa NOT DETECTED NOT DETECTED Final   Candida albicans NOT DETECTED NOT DETECTED Final   Candida glabrata NOT DETECTED NOT DETECTED Final  Candida krusei NOT DETECTED NOT DETECTED Final   Candida parapsilosis NOT DETECTED NOT DETECTED Final   Candida tropicalis NOT DETECTED NOT DETECTED Final    Comment: Performed at [facility], [place] [address], [place], [place] [address]  Blood Culture (routine x 2)     Status: Abnormal   Collection Time: 09/04/18  2:30 PM  Result Value Ref Range Status   Specimen Description BLOOD BLOOD LEFT WRIST  Final   Special Requests   Final    BOTTLES DRAWN AEROBIC AND ANAEROBIC Blood Culture results may not be optimal due to an inadequate volume of blood received in culture bottles   Culture  Setup Time   Final    GRAM NEGATIVE RODS AEROBIC BOTTLE ONLY CRITICAL RESULT CALLED TO, READ BACK BY AND VERIFIED WITHDenton Brick [facility] 4166 09/05/18 A BROWNING Performed at [facility], [place] [address] [place], [place] [address]    Culture KLEBSIELLA ORNITHINOLYTICA (A)  Final   Report Status 09/08/2018 FINAL  Final   Organism ID, Bacteria KLEBSIELLA ORNITHINOLYTICA  Final      Susceptibility   Klebsiella ornithinolytica - MIC*    AMPICILLIN >=32 RESISTANT Resistant     CEFAZOLIN >=64 RESISTANT Resistant     CEFEPIME 4 SENSITIVE Sensitive     CEFTAZIDIME 16 INTERMEDIATE Intermediate     CEFTRIAXONE >=64 RESISTANT Resistant       CIPROFLOXACIN >=4 RESISTANT Resistant     GENTAMICIN >=16 RESISTANT Resistant     IMIPENEM <=0.25 SENSITIVE Sensitive     TRIMETH/SULFA >=320 RESISTANT Resistant     AMPICILLIN/SULBACTAM >=32 RESISTANT Resistant     PIP/TAZO 32 INTERMEDIATE Intermediate     * KLEBSIELLA ORNITHINOLYTICA  Culture, blood (Routine X 2) w Reflex to ID Panel     Status: None   Collection Time: 09/06/18  8:12 AM  Result Value Ref Range Status   Specimen Description BLOOD LEFT ANTECUBITAL  Final   Special Requests   Final    BOTTLES DRAWN AEROBIC ONLY Blood Culture adequate volume   Culture   Final    NO GROWTH 5 DAYS Performed at [facility], [address] [place], [place] [address]    Report Status 09/11/2018 FINAL  Final    Studies/Results: No results found.   Assessment/Plan: TAVR ICD with vegetation on wire on TEE Klebsiella bacteremia (ESBL)  Total days of antibiotics: 10 (merrem)  Repeat BCx 11-24 are negative.  Planned for pacer extraction on 12-3.  Will need anbx for 6 weeks afterwards. WBC normal, Cr stable, afebrile.  Could change to invanz for ease of dosing at d/c.           Bobby Rumpf MD, FACP Infectious Diseases (pager) [phone] [email or web address] 09/14/2018, 8:58 AM  LOS: 10 days

## 2018-09-14 NOTE — Consult Note (Signed)
   [facility] CM Inpatient Consult   27/11/5364  William [name] [birth year] [id number]    [facility] Care Management follow up.  Spoke with inpatient RNCM. Patient being followed for potential CIR. Will continue to follow along for disposition plans and progression.   Marthenia Rolling, MSN-Ed, RN,BSN [facility] Liaison [phone]

## 2018-09-14 NOTE — Progress Notes (Signed)
PROGRESS NOTE    William [name]  XIP:[id number] DOB: May 21, 1928 DOA: 09/04/2018 PCP: Haywood Pao, MD    Brief Narrative:  84 y.o.malewith a hx of Chronic Systolic CHF (EF 67-34%), Severe AoS s/p TAVR 08/18/18, ventricular tachycardia s/p ICD,AAA,hyperlipidemia, prostate cancer, recurrent UTI, and degenerative arthritis who presentedto the hospital with sudden onset of fevers, chills, and fatigue.  Patient was recently admitted from 08/07/2018-08/13/2018 for septic shock secondary to Klebsiella UTI. He was admitted again from 08/18/2018-08/20/2018 for successful TAVR procedure.  In the ED he was found to have a pulse of 120, RR 26, temp 101.74F. Urinalysis was negative. CXR showed a right basilar opacity suggestive of atelectasis versus airspace disease/pneumonia. CT abdomen/pelvis with contrast showed uncomplicated cholelithiasis, 4.4 cm infrarenal AAA, bilateral simple and complex renal cyst, and colonic diverticulosis without acute diverticulitis.   Assessment & Plan:   Principal Problem:   Sepsis ([place]) Active Problems:   Nonischemic cardiomyopathy (HCC)   Hyperlipidemia   S/P ICD (internal cardiac defibrillator) procedure, 01/11/14 removal of ERI gen and placement of Medtronic Evera XT VR & NEW Right ventricular lead Medtronic   Ventricular tachycardia (HCC)   S/P TAVR (transcatheter aortic valve replacement)   Bacteremia due to Klebsiella pneumoniae   Acute bacterial endocarditis   Infected defibrillator (HCC)  1 ESBL Klebsiella bacteremia/pacemaker lead vegetation/endocarditis Patient presented with fevers and chills blood cultures positive for ESBL Klebsiella bacteremia.  Patient underwent 2D echo which showed a EF of 35 to 40%, diffuse hypokinesis, akinesis of the inferior septal myocardium, TAVR bioprosthesis present with no evidence of vegetation.  Patient status post TEE on 09/08/2018 concerning for vegetation to lead.  Patient seen in consultation by ID who  are recommending 6 weeks of IV meropenem.  Repeat blood cultures done 09/06/2018 with no growth to date.  Patient seen by cardiology, cardiothoracic surgery as well as the EP team and patient for device extraction on Tuesday, 09/15/2018.  Per ID continue IV antibiotics until lead extraction and then will need a total of 6 weeks of IV antibiotics post lead extraction.  PICC line to be placed post lead extraction.  Per ID they will change antibiotics to Invanz for ease of dosing at discharge.  ID and EP cardiology following.  Follow.  2.  Severe aortic stenosis status post TAVR 08/18/2018 Valve team following.  3.  Hypotension Hypotension improved.  Systolic blood pressure now 95/60.  IV fluids have been saline locked.  Follow.   4.  Chronic systolic heart failure/nonischemic cardiomyopathy Patient noted per 2D echo of 09/04/2018 12 a EF of 35 to 40%.  Patient currently euvolemic on examination.  Current weight is 95.7 kg from 94.1 kg from 96.2 kg from Weight on 09/10/2018 was 99.4 kg from 98.3 kg from 98.3 kg from 97.9 kg from 100.4 kg.  Monitor volume status closely.  Saline lock IV fluids.  Patient with a urine output not recorded.  Foley catheter has been removed.  Per cardiology.  5.  Hyperlipidemia Stable.  Continue Vytorin.  Outpatient follow-up.   6.  Infrarenal AAA Patient with a 4.4 cm infrarenal fusiform AAA seen on CT abdomen and pelvis.  Followed by T CTS.  7.  History of ventricular tachycardia status post ICD No episodes of V. tach during this hospitalization.  Continue amiodarone.  Patient has been evaluated by EP and patient for device extraction tomorrow 09/15/2018. Per EP.  8.  Anemia Patient with no overt bleeding.  Hemoglobin currently stable at 11.3.  Follow H&H.  Transfusion  threshold hemoglobin less than 7.  9.  Hypokalemia Repleted.   DVT prophylaxis: Lovenox Code Status: Full Family Communication: Updated patient and daughter at bedside.  Disposition Plan: Remain on  telemetry.  CIR when okay with cardiology.     Consultants:   Cardiothoracic surgery: Dr. Alvan Dame 09/04/2018  Infectious disease: Dr. Johnnye Sima 09/05/2018  Cardiology/valve team Dr. Angelena Form 09/08/2018  Cardiology: Dr. Debara Pickett 09/06/2018  Electrophysiology: Dr. Lovena Le 09/09/2018  Procedures:   CT abdomen and pelvis 09/04/2018  Chest x-ray 09/04/2018  2D echo 09/04/2018  Transesophageal echocardiogram 09/08/2018--- severe global reduction in LV systolic function: Status post AVR with trace periprosthetic AI, moderate MR,severe LAE, oscillating density on pacer wire concerning for vegetation.--- Per Dr. Stanford Breed  Antimicrobials:   IV cefepime 09/04/2018>>>> 09/05/2018  IV Rocephin 09/05/2018>>>>> 09/07/2018  IV Merrem 09/07/2018  IV vancomycin 09/04/2018>>>>> 09/05/2018   Subjective: Patient sitting in chair.  No chest pain.  No shortness of breath.  No abdominal pain.  Daughter at bedside.   Objective: Vitals:   09/13/18 1418 09/13/18 2021 09/14/18 0555 09/14/18 0959  BP: 110/75 104/76 (!) 107/53 95/60  Pulse:      Resp:  (!) 22 20 (!) 23  Temp:  97.9 F (36.6 C) 97.9 F (36.6 C) (!) 97.5 F (36.4 C)  TempSrc:  Oral Oral Oral  SpO2: 96%  98% 99%  Weight:   95.7 kg   Height:        Intake/Output Summary (Last 24 hours) at 09/14/2018 1056 Last data filed at 09/13/2018 1810 Gross per 24 hour  Intake 363 ml  Output -  Net 363 ml   Filed Weights   09/12/18 0500 09/13/18 0429 09/14/18 0555  Weight: 96.2 kg 94.1 kg 95.7 kg    Examination:  General exam: In chair. Respiratory system: Lungs clear to auscultation bilaterally.  No wheezes, no crackles, no rhonchi.  Normal respiratory effort.  Cardiovascular system: RRR no murmurs rubs or gallops.  No JVD.  No lower extremity edema.  Gastrointestinal system: Abdomen is soft, nontender, nondistended, positive bowel sounds.  No rebound.  No guarding.  Central nervous system: Alert and oriented. No focal neurological  deficits. Extremities: Symmetric 5 x 5 power. Skin: No rashes, lesions or ulcers Psychiatry: Judgement and insight appear fair. Mood & affect appropriate.     Data Reviewed: I have personally reviewed following labs and imaging studies  CBC: Recent Labs  Lab 09/09/18 0826 09/10/18 0317 09/11/18 0356 09/13/18 0253 09/14/18 0308  WBC 4.2 4.2 5.1 4.9 4.8  NEUTROABS 2.7 2.4 3.1  --  2.7  HGB 11.4* 11.1* 11.1* 11.3* 11.3*  HCT 33.0* 32.2* 32.9* 32.7* 33.0*  MCV 97.3 96.7 96.5 95.6 95.4  PLT 106* 108* 111* 119* 620*   Basic Metabolic Panel: Recent Labs  Lab 09/08/18 0719 09/09/18 0826 09/10/18 0317 09/11/18 0356 09/12/18 0247 09/13/18 0253 09/14/18 0308  NA 135 132* 134* 133* 132* 131* 132*  K 3.5 3.7 3.6 3.8 3.4* 4.0 3.7  CL 105 103 103 104 99 100 100  CO2 23 21* 21* 22 25 21* 24  GLUCOSE 129* 126* 115* 130* 121* 124* 120*  BUN 12 13 12 13 16 20 20   CREATININE 0.95 0.92 0.92 0.98 0.96 1.07 0.91  CALCIUM 8.1* 8.2* 8.3* 8.5* 8.2* 8.2* 8.3*  MG 2.3 2.2  --   --   --   --   --    GFR: Estimated Creatinine Clearance: 65.2 mL/min (by C-G formula based on SCr of 0.91 mg/dL). Liver Function Tests:  Recent Labs  Lab 09/08/18 0719 09/09/18 0826  AST 28 26  ALT 47* 42  ALKPHOS 175* 155*  BILITOT 1.2 1.4*  PROT 5.7* 5.6*  ALBUMIN 2.4* 2.5*   No results for input(s): LIPASE, AMYLASE in the last 168 hours. No results for input(s): AMMONIA in the last 168 hours. Coagulation Profile: No results for input(s): INR, PROTIME in the last 168 hours. Cardiac Enzymes: No results for input(s): CKTOTAL, CKMB, CKMBINDEX, TROPONINI in the last 168 hours. BNP (last 3 results) No results for input(s): PROBNP in the last 8760 hours. HbA1C: No results for input(s): HGBA1C in the last 72 hours. CBG: No results for input(s): GLUCAP in the last 168 hours. Lipid Profile: No results for input(s): CHOL, HDL, LDLCALC, TRIG, CHOLHDL, LDLDIRECT in the last 72 hours. Thyroid Function  Tests: No results for input(s): TSH, T4TOTAL, FREET4, T3FREE, THYROIDAB in the last 72 hours. Anemia Panel: No results for input(s): VITAMINB12, FOLATE, FERRITIN, TIBC, IRON, RETICCTPCT in the last 72 hours. Sepsis Labs: No results for input(s): PROCALCITON, LATICACIDVEN in the last 168 hours.  Recent Results (from the past 240 hour(s))  Blood Culture (routine x 2)     Status: Abnormal   Collection Time: 09/04/18  2:25 PM  Result Value Ref Range Status   Specimen Description BLOOD BLOOD RIGHT FOREARM  Final   Special Requests   Final    BOTTLES DRAWN AEROBIC AND ANAEROBIC Blood Culture adequate volume   Culture  Setup Time   Final    GRAM NEGATIVE RODS IN BOTH AEROBIC AND ANAEROBIC BOTTLES CRITICAL RESULT CALLED TO, READ BACK BY AND VERIFIED WITHDenton Brick [facility] 7672 09/05/18 A BROWNING Performed at [facility], [place] [address], [place], [place] [address]    Culture (A)  Final    KLEBSIELLA PNEUMONIAE Confirmed Extended Spectrum Beta-Lactamase Producer (ESBL).  In bloodstream infections from ESBL organisms, carbapenems are preferred over piperacillin/tazobactam. They are shown to have a lower risk of mortality.    Report Status 09/07/2018 FINAL  Final   Organism ID, Bacteria KLEBSIELLA PNEUMONIAE  Final      Susceptibility   Klebsiella pneumoniae - MIC*    AMPICILLIN >=32 RESISTANT Resistant     CEFAZOLIN >=64 RESISTANT Resistant     CEFEPIME RESISTANT Resistant     CEFTAZIDIME 32 RESISTANT Resistant     CEFTRIAXONE >=64 RESISTANT Resistant     CIPROFLOXACIN >=4 RESISTANT Resistant     GENTAMICIN >=16 RESISTANT Resistant     IMIPENEM <=0.25 SENSITIVE Sensitive     TRIMETH/SULFA >=320 RESISTANT Resistant     AMPICILLIN/SULBACTAM >=32 RESISTANT Resistant     PIP/TAZO 32 INTERMEDIATE Intermediate     Extended ESBL POSITIVE Resistant     * KLEBSIELLA PNEUMONIAE  Blood Culture ID Panel (Reflexed)     Status: Abnormal   Collection Time: 09/04/18  2:25 PM  Result Value Ref  Range Status   Enterococcus species NOT DETECTED NOT DETECTED Final   Listeria monocytogenes NOT DETECTED NOT DETECTED Final   Staphylococcus species NOT DETECTED NOT DETECTED Final   Staphylococcus aureus (BCID) NOT DETECTED NOT DETECTED Final   Streptococcus species NOT DETECTED NOT DETECTED Final   Streptococcus agalactiae NOT DETECTED NOT DETECTED Final   Streptococcus pneumoniae NOT DETECTED NOT DETECTED Final   Streptococcus pyogenes NOT DETECTED NOT DETECTED Final   Acinetobacter baumannii NOT DETECTED NOT DETECTED Final   Enterobacteriaceae species DETECTED (A) NOT DETECTED Final    Comment: Enterobacteriaceae represent a large family of gram-negative bacteria, not  a single organism. CRITICAL RESULT CALLED TO, READ BACK BY AND VERIFIED WITH: Denton Brick PHARMD 4174 09/05/18 A BROWNING    Enterobacter cloacae complex NOT DETECTED NOT DETECTED Final   Escherichia coli NOT DETECTED NOT DETECTED Final   Klebsiella oxytoca NOT DETECTED NOT DETECTED Final   Klebsiella pneumoniae DETECTED (A) NOT DETECTED Final    Comment: CRITICAL RESULT CALLED TO, READ BACK BY AND VERIFIED WITH: Denton Brick PHARMD 0814 09/05/18 A BROWNING    Proteus species NOT DETECTED NOT DETECTED Final   Serratia marcescens NOT DETECTED NOT DETECTED Final   Carbapenem resistance NOT DETECTED NOT DETECTED Final   Haemophilus influenzae NOT DETECTED NOT DETECTED Final   Neisseria meningitidis NOT DETECTED NOT DETECTED Final   Pseudomonas aeruginosa NOT DETECTED NOT DETECTED Final   Candida albicans NOT DETECTED NOT DETECTED Final   Candida glabrata NOT DETECTED NOT DETECTED Final   Candida krusei NOT DETECTED NOT DETECTED Final   Candida parapsilosis NOT DETECTED NOT DETECTED Final   Candida tropicalis NOT DETECTED NOT DETECTED Final    Comment: Performed at [facility], [address], [place], [place] [address]  Blood Culture (routine x 2)     Status: Abnormal   Collection Time: 09/04/18  2:30 PM  Result  Value Ref Range Status   Specimen Description BLOOD BLOOD LEFT WRIST  Final   Special Requests   Final    BOTTLES DRAWN AEROBIC AND ANAEROBIC Blood Culture results may not be optimal due to an inadequate volume of blood received in culture bottles   Culture  Setup Time   Final    GRAM NEGATIVE RODS AEROBIC BOTTLE ONLY CRITICAL RESULT CALLED TO, READ BACK BY AND VERIFIED WITHDenton Brick [facility] 6314 09/05/18 A BROWNING Performed at [facility], [place] [address], [place], [place] [address]    Culture KLEBSIELLA ORNITHINOLYTICA (A)  Final   Report Status 09/08/2018 FINAL  Final   Organism ID, Bacteria KLEBSIELLA ORNITHINOLYTICA  Final      Susceptibility   Klebsiella ornithinolytica - MIC*    AMPICILLIN >=32 RESISTANT Resistant     CEFAZOLIN >=64 RESISTANT Resistant     CEFEPIME 4 SENSITIVE Sensitive     CEFTAZIDIME 16 INTERMEDIATE Intermediate     CEFTRIAXONE >=64 RESISTANT Resistant     CIPROFLOXACIN >=4 RESISTANT Resistant     GENTAMICIN >=16 RESISTANT Resistant     IMIPENEM <=0.25 SENSITIVE Sensitive     TRIMETH/SULFA >=320 RESISTANT Resistant     AMPICILLIN/SULBACTAM >=32 RESISTANT Resistant     PIP/TAZO 32 INTERMEDIATE Intermediate     * KLEBSIELLA ORNITHINOLYTICA  Culture, blood (Routine X 2) w Reflex to ID Panel     Status: None   Collection Time: 09/06/18  8:12 AM  Result Value Ref Range Status   Specimen Description BLOOD LEFT ANTECUBITAL  Final   Special Requests   Final    BOTTLES DRAWN AEROBIC ONLY Blood Culture adequate volume   Culture   Final    NO GROWTH 5 DAYS Performed at [facility], [address], [place], [place] [address]    Report Status 09/11/2018 FINAL  Final         Radiology Studies: No results found.      Scheduled Meds: . amiodarone  100 mg Oral Daily  . aspirin  81 mg Oral Daily  . chlorhexidine  60 mL Topical Once  . chlorhexidine  60 mL Topical Once  . clopidogrel  75 mg Oral Q breakfast  .  ezetimibe-simvastatin  1  tablet Oral QHS  . gentamicin irrigation  80 mg Irrigation On Call  . sodium chloride flush  3 mL Intravenous Q12H   Continuous Infusions: . sodium chloride    .  ceFAZolin (ANCEF) IV    . meropenem (MERREM) IV 1 g (09/14/18 0531)     LOS: 10 days    Time spent: 35 minutes    Irine Seal, MD [facility] Pager [phone]  If 7PM-7AM, please contact night-coverage [email or web address] Password [facility] 09/14/2018, 10:56 AM

## 2018-09-14 NOTE — Telephone Encounter (Signed)
Patient's daughter is asking for Dr Johnnye Sima to give her a call back. She was not in the room during morning rounds, has questions she'd like to ask before they proceed with wire removal tomorrow. 1) her father had recent dental implants (has 2 posts currently awaiting crowns). She wanted to make sure DR Johnnye Sima was aware of this in case it is the source of her father's infection. 2) is there any benefit to leaving the pacemaker in so that if the bacteria returns, it will attach to the pacemaker rather than her father's heart valve? Patient would like to speak directly with Dr Johnnye Sima if possible.  [phone] Landis Gandy, RN

## 2018-09-14 NOTE — Progress Notes (Signed)
Occupational Therapy Treatment Patient Details Name: William [name] MRN: [id number] DOB: 1928-01-15 Today's Date: 09/14/2018    History of present illness Pt is a 84 y.o. M with significant PMH of chronic systolic EF, ICM, severe AS s/p TAVR 11/5, hx of ventricular tachycardia s/p ICD, AAA, hyperlipidemia, prostate CA with recurrent UTI, who presents with sudden onset of fever, chills, fatigue. Recent admits 10/25-10/31 for septic shock secondary to UTI. Admitted 11/5-11/7 for TAVR. Concern for TAVR infection from previous UTI.    OT comments  This 84 yo male admitted with above presents to acute OT making progress with mobility (ambulating further with same A) and doing some grooming activities today. He will continue to benefit from acute OT with follow up OT on CIR to get as mobile as possible before going home with wife.   Follow Up Recommendations  CIR;Supervision/Assistance - 24 hour    Equipment Recommendations  Other (comment)(TBD next venue)    Recommendations for Other Services Rehab consult    Precautions / Restrictions Precautions Precautions: Fall Required Braces or Orthoses: Other Brace Other Brace: Right wrist splint from recent wrist dislocation per pt family; but can use walker with splint on per family Restrictions Weight Bearing Restrictions: No       Mobility Bed Mobility Overal bed mobility: Needs Assistance Bed Mobility: Supine to Sit     Supine to sit: Mod assist;+2 for physical assistance;HOB elevated     General bed mobility comments: Increased time and effort, max multi modal cues for performance  Transfers Overall transfer level: Needs assistance Equipment used: Rolling walker (2 wheeled) Transfers: Sit to/from Stand Sit to Stand: Min assist;+2 physical assistance         General transfer comment: +2 min assist to power up to standing with RW utilizing specific verbal cue of counting " 1-2-3 stand" increased time and effort, performed x3  during session    Balance Overall balance assessment: Needs assistance Sitting-balance support: Feet supported;Bilateral upper extremity supported Sitting balance-Leahy Scale: Poor Sitting balance - Comments: posterior lean with BUE support Postural control: Other (comment)(anterior) Standing balance support: Bilateral upper extremity supported;During functional activity Standing balance-Leahy Scale: Poor Standing balance comment: heavy reliance on RW for upright support and increased external support                           ADL either performed or assessed with clinical judgement   ADL Overall ADL's : Needs assistance/impaired     Grooming: Wash/dry face;Oral care;Sitting Grooming Details (indicate cue type and reason): in recliner. Washed face once wash cloth presented and informed what to do (setup-S). Did mouth care as well but needed more instruction (per dtr, pt's wife usually does this for him)--min A (get oriented in mouth and reoriented to do upper teeth as well) once toothbrush with toothpaste given to him                   Toilet Transfer Details (indicate cue type and reason): Min A +2 sit<>stand (counting to 3 as cue to stand up); Mod A +2 stand turn (bed>ambulate to door>sit in recliner>ambulate back to bed>sit in recliner.                 Vision Baseline Vision/History: Wears glasses Wears Glasses: At all times            Cognition Arousal/Alertness: Awake/alert Behavior During Therapy: WFL for tasks assessed/performed Overall Cognitive Status: History of cognitive impairments -  at baseline                                 General Comments: Baseline dementia. Needs repeated verbal/tactile cues. Very poor safety awareness                   Pertinent Vitals/ Pain       Pain Assessment: Faces Faces Pain Scale: Hurts a little bit Pain Location: general Pain Descriptors / Indicators: Grimacing Pain Intervention(s):  Monitored during session         Frequency  Min 3X/week        Progress Toward Goals  OT Goals(current goals can now be found in the care plan section)  Progress towards OT goals: Progressing toward goals  Acute Rehab OT Goals Patient Stated Goal: pt family wants him up in the chair  Plan Discharge plan remains appropriate    Co-evaluation    PT/OT/SLP Co-Evaluation/Treatment: Yes Reason for Co-Treatment: Complexity of the patient's impairments (multi-system involvement);Necessary to address cognition/behavior during functional activity;For patient/therapist safety;To address functional/ADL transfers PT goals addressed during session: Mobility/safety with mobility OT goals addressed during session: ADL's and self-care      AM-PAC OT "6 Clicks" Daily Activity     Outcome Measure   Help from another person eating meals?: A Little Help from another person taking care of personal grooming?: A Little Help from another person toileting, which includes using toliet, bedpan, or urinal?: A Lot Help from another person bathing (including washing, rinsing, drying)?: A Lot Help from another person to put on and taking off regular upper body clothing?: A Lot Help from another person to put on and taking off regular lower body clothing?: Total 6 Click Score: 13    End of Session Equipment Utilized During Treatment: Gait belt;Rolling walker  OT Visit Diagnosis: Unsteadiness on feet (R26.81);Other abnormalities of gait and mobility (R26.89);Muscle weakness (generalized) (M62.81);Other symptoms and signs involving cognitive function   Activity Tolerance Patient tolerated treatment well   Patient Left in chair;with call bell/phone within reach;with chair alarm set;with family/visitor present   Nurse Communication Mobility status        Time: 8811-0315 OT Time Calculation (min): 29 min  Charges: OT General Charges $OT Visit: 1 Visit OT Treatments $Self Care/Home Management  : 8-22 mins William [name], OTR/L Acute [employer] Pager [phone] Office [phone]      William [name] 09/14/2018, 10:34 AM

## 2018-09-14 NOTE — Progress Notes (Addendum)
Progress Note  Patient Name: William [name] Date of Encounter: 09/14/2018  Primary Cardiologist: Sanda Klein, MD   Subjective   No complaints, daughter is at bedside.    Inpatient Medications    Scheduled Meds: . amiodarone  100 mg Oral Daily  . aspirin  81 mg Oral Daily  . clopidogrel  75 mg Oral Q breakfast  . enoxaparin (LOVENOX) injection  40 mg Subcutaneous Q24H  . ezetimibe-simvastatin  1 tablet Oral QHS  . sodium chloride flush  3 mL Intravenous Q12H   Continuous Infusions: . meropenem (MERREM) IV 1 g (09/14/18 0531)   PRN Meds: acetaminophen, polyethylene glycol   Vital Signs    Vitals:   09/13/18 0909 09/13/18 1418 09/13/18 2021 09/14/18 0555  BP: (!) 119/91 110/75 104/76 (!) 107/53  Pulse: (!) 36     Resp: 19  (!) 22 20  Temp: 98.1 F (36.7 C)  97.9 F (36.6 C) 97.9 F (36.6 C)  TempSrc: Oral  Oral Oral  SpO2: 95% 96%  98%  Weight:    95.7 kg  Height:        Intake/Output Summary (Last 24 hours) at 09/14/2018 0921 Last data filed at 09/13/2018 1810 Gross per 24 hour  Intake 363 ml  Output -  Net 363 ml   Filed Weights   09/12/18 0500 09/13/18 0429 09/14/18 0555  Weight: 96.2 kg 94.1 kg 95.7 kg    Telemetry    SR, infrequent PVCs - Personally Reviewed  ECG    No new EKGs - Personally Reviewed  Physical Exam   GEN: No acute distress.   Neck: No JVD Cardiac: RRR, no murmurs, rubs, or gallops.  Respiratory: CTA b/l. GI: Soft, nontender, non-distended  MS: No edema; No deformity. Neuro:  Nonfocal, he is oriented this morning Psych: Normal affect   Labs    Chemistry Recent Labs  Lab 09/08/18 0719 09/09/18 0826  09/12/18 0247 09/13/18 0253 09/14/18 0308  NA 135 132*   < > 132* 131* 132*  K 3.5 3.7   < > 3.4* 4.0 3.7  CL 105 103   < > 99 100 100  CO2 23 21*   < > 25 21* 24  GLUCOSE 129* 126*   < > 121* 124* 120*  BUN 12 13   < > 16 20 20   CREATININE 0.95 0.92   < > 0.96 1.07 0.91  CALCIUM 8.1* 8.2*   < > 8.2* 8.2*  8.3*  PROT 5.7* 5.6*  --   --   --   --   ALBUMIN 2.4* 2.5*  --   --   --   --   AST 28 26  --   --   --   --   ALT 47* 42  --   --   --   --   ALKPHOS 175* 155*  --   --   --   --   BILITOT 1.2 1.4*  --   --   --   --   GFRNONAA >60 >60   < > >60 >60 >60  GFRAA >60 >60   < > >60 >60 >60  ANIONGAP 7 8   < > 8 10 8    < > = values in this interval not displayed.     Hematology Recent Labs  Lab 09/11/18 0356 09/13/18 0253 09/14/18 0308  WBC 5.1 4.9 4.8  RBC 3.41* 3.42* 3.46*  HGB 11.1* 11.3* 11.3*  HCT 32.9* 32.7* 33.0*  MCV 96.5 95.6 95.4  MCH 32.6 33.0 32.7  MCHC 33.7 34.6 34.2  RDW 15.6* 15.7* 15.6*  PLT 111* 119* 120*    Cardiac Enzymes No results for input(s): TROPONINI in the last 168 hours. No results for input(s): TROPIPOC in the last 168 hours.   BNPNo results for input(s): BNP, PROBNP in the last 168 hours.   DDimer No results for input(s): DDIMER in the last 168 hours.   Radiology    No results found.  Cardiac Studies   09/04/18; TTE Study Conclusions - Left ventricle: The cavity size was normal. Wall thickness was increased in a pattern of mild LVH. Systolic function was moderately reduced. The estimated ejection fraction was in the range of 35% to 40%. Diffuse hypokinesis. There is akinesis of the inferoseptal myocardium. - Aortic valve: A TAVR bioprosthesis was present. Peak velocity (S): 224 cm/s. Mean gradient (S): 10 mm Hg. Valve area (VTI): 2.63 cm^2. Valve area (Vmax): 2.54 cm^2. Valve area (Vmean): 2.45 cm^2. - Mitral valve: There was mild regurgitation. - Left atrium: The atrium was mildly dilated. - Pulmonary arteries: Systolic pressure was mildly increased. PA peak pressure: 34 mm Hg (S). Impressions: - There was no evidence of a vegetation. Consider TEE if further sensitivity is needed  Patient Profile     84 y.o. male hx of AAA, NICM, chronic CHF (systolic), VT w/ICD, OSA (not on CPAP), arthritis, CBP/lumbar  vertebral fracture, prostate ca w/hx of recurrent UTIs/incontinance, and severe AS recently s/p TAVR admitted with sepsis.  Found with bacteremia KLEBSIELLA ORNITHINOLYTICA, KLEBSIELLA PNEUMONIAE Follow up BC (x1) is neg x 5 days  TEE done 09/08/18 Severe LV dysfunction; severe LAE; s/p AVR; trace AI; moderate (3+) MR; TV not well visualized; oscillating density on ICD lead concerning for vegetation  Device information MDT single chamber ICD implanted 01/11/14 CXR has an abandoned lead, one of the leads is a dual coil ICD lead Hx hx of having sprint Fidelis lead abandoned looks like implant of this lead was 2005  Assessment & Plan     1. ESBL kelbsiella bacteremia     Blood cx showing 2 different isolates of kleb species      on Abx tx w/ID     F/u BC neg x 5 days  2. TEE with what appears a vegitation on device lead 3. Hx of NICM w/VT     Patient has an abandoned Srint Fidelis lead, implanted 2005      New lead and generator from 2015     No hx of CABG 4. VHD     Has TAVR from a few weeks ago 5. Dementia (?)     Wife states when he 1st wakes need "a while" to clear up     Pt was able to be re-oriented easily   Device check done here <0.1% VP Lifetime for this device is the one known ATP in 2016, no shocks 3, NSVT only noted since last interrogation, longest 4 sec   For questions or updates, please contact [place] Please consult [email or web address] for contact info under   EP Attending  Patient seen and examined. Agree with the findings as noted above. The patient has ICD system infection and we will plan to proceed with extraction of his ICD's tomorrow. I have reviewed the indications/risks/benefits/goals/expectations of the procedure with the patient and his daughter and they are willing to proceed.  Yovanna Cogan,M.D.     Signed, Baldwin [place], PA-C  09/14/2018, 9:21 AM

## 2018-09-15 ENCOUNTER — Inpatient Hospital Stay (HOSPITAL_COMMUNITY): Payer: Medicare Other

## 2018-09-15 ENCOUNTER — Inpatient Hospital Stay (HOSPITAL_COMMUNITY): Payer: Medicare Other | Admitting: Certified Registered Nurse Anesthetist

## 2018-09-15 ENCOUNTER — Encounter (HOSPITAL_COMMUNITY): Payer: Self-pay

## 2018-09-15 ENCOUNTER — Encounter (HOSPITAL_COMMUNITY)
Admission: EM | Disposition: A | Discharge status: Rehabilitation Facility | Payer: Self-pay | Source: Home / Self Care | Attending: Internal Medicine

## 2018-09-15 DIAGNOSIS — Z888 Allergy status to other drugs, medicaments and biological substances status: Secondary | ICD-10-CM

## 2018-09-15 DIAGNOSIS — T827XXA Infection and inflammatory reaction due to other cardiac and vascular devices, implants and grafts, initial encounter: Secondary | ICD-10-CM

## 2018-09-15 HISTORY — PX: ICD LEAD REMOVAL: SHX5855

## 2018-09-15 HISTORY — PX: TEE WITHOUT CARDIOVERSION: SHX5443

## 2018-09-15 LAB — MAGNESIUM: Magnesium: 2.4 mg/dL (ref 1.7–2.4)

## 2018-09-15 LAB — BASIC METABOLIC PANEL
Anion gap: 8 (ref 5–15)
BUN: 17 mg/dL (ref 8–23)
CO2: 24 mmol/L (ref 22–32)
Calcium: 8.1 mg/dL — ABNORMAL LOW (ref 8.9–10.3)
Chloride: 101 mmol/L (ref 98–111)
Creatinine, Ser: 1.01 mg/dL (ref 0.61–1.24)
GFR calc Af Amer: >60 mL/min (ref >60)
GFR calc non Af Amer: >60 mL/min (ref >60)
Glucose, Bld: 155 mg/dL — ABNORMAL HIGH (ref 70–99)
Potassium: 3.7 mmol/L (ref 3.5–5.1)
Sodium: 133 mmol/L — ABNORMAL LOW (ref 135–145)

## 2018-09-15 LAB — CBC
HCT: 31.3 % — ABNORMAL LOW (ref 39.0–52.0)
Hemoglobin: 10.8 g/dL — ABNORMAL LOW (ref 13.0–17.0)
MCH: 33 pg (ref 26.0–34.0)
MCHC: 34.5 g/dL (ref 30.0–36.0)
MCV: 95.7 fL (ref 80.0–100.0)
PLATELETS: 125 10*3/uL — AB (ref 150–400)
RBC: 3.27 MIL/uL — ABNORMAL LOW (ref 4.22–5.81)
RDW: 15.4 % (ref 11.5–15.5)
WBC: 4.3 10*3/uL (ref 4.0–10.5)
nRBC: 0 % (ref 0.0–0.2)

## 2018-09-15 SURGERY — REMOVAL, ELECTRODE LEAD, ICD
Anesthesia: General | Site: Chest

## 2018-09-15 MED ORDER — ROCURONIUM BROMIDE 10 MG/ML (PF) SYRINGE
PREFILLED_SYRINGE | INTRAVENOUS | Status: DC | PRN
  Administered 2018-09-15: 50 mg via INTRAVENOUS

## 2018-09-15 MED ORDER — SODIUM CHLORIDE 0.9 % IV SOLN
INTRAVENOUS | Status: DC | PRN
  Administered 2018-09-15: 18:00:00

## 2018-09-15 MED ORDER — FENTANYL CITRATE (PF) 100 MCG/2ML IJ SOLN
INTRAMUSCULAR | Status: DC | PRN
  Administered 2018-09-15 (×2): 50 ug via INTRAVENOUS

## 2018-09-15 MED ORDER — ONDANSETRON HCL 4 MG/2ML IJ SOLN: 4.0000 mg | Freq: Four times a day (QID) | INTRAMUSCULAR | Status: DC | PRN

## 2018-09-15 MED ORDER — SUGAMMADEX SODIUM 200 MG/2ML IV SOLN
INTRAVENOUS | Status: DC | PRN
  Administered 2018-09-15: 200 mg via INTRAVENOUS

## 2018-09-15 MED ORDER — SODIUM CHLORIDE 0.9 % IV SOLN
INTRAVENOUS | Status: DC | PRN
  Administered 2018-09-15: 50 ug/min via INTRAVENOUS
  Administered 2018-09-15: 30 ug/min via INTRAVENOUS

## 2018-09-15 MED ORDER — FENTANYL CITRATE (PF) 250 MCG/5ML IJ SOLN
INTRAMUSCULAR | Status: AC
  Filled 2018-09-15: qty 5

## 2018-09-15 MED ORDER — PROPOFOL 10 MG/ML IV BOLUS
INTRAVENOUS | Status: DC | PRN
  Administered 2018-09-15: 80 mg via INTRAVENOUS

## 2018-09-15 MED ORDER — LACTATED RINGERS IV SOLN
INTRAVENOUS | Status: DC | PRN
  Administered 2018-09-15 (×3): via INTRAVENOUS

## 2018-09-15 MED ORDER — CEFAZOLIN SODIUM-DEXTROSE 2-4 GM/100ML-% IV SOLN
INTRAVENOUS | Status: AC
  Filled 2018-09-15: qty 100

## 2018-09-15 MED ORDER — CEFAZOLIN SODIUM-DEXTROSE 2-3 GM-%(50ML) IV SOLR
INTRAVENOUS | Status: DC | PRN
  Administered 2018-09-15: 2 g via INTRAVENOUS

## 2018-09-15 MED ORDER — PHENYLEPHRINE 40 MCG/ML (10ML) SYRINGE FOR IV PUSH (FOR BLOOD PRESSURE SUPPORT)
PREFILLED_SYRINGE | INTRAVENOUS | Status: DC | PRN
  Administered 2018-09-15: 100 ug via INTRAVENOUS

## 2018-09-15 MED ORDER — NOREPINEPHRINE BITARTRATE 1 MG/ML IV SOLN: INTRAVENOUS | Status: DC | PRN

## 2018-09-15 MED ORDER — SODIUM CHLORIDE 0.9 % IV SOLN
INTRAVENOUS | Status: AC
  Filled 2018-09-15: qty 1.2

## 2018-09-15 MED ORDER — LACTATED RINGERS IV SOLN
INTRAVENOUS | Status: DC
  Administered 2018-09-15: 16:00:00 via INTRAVENOUS

## 2018-09-15 MED ORDER — LIDOCAINE 2% (20 MG/ML) 5 ML SYRINGE
INTRAMUSCULAR | Status: DC | PRN
  Administered 2018-09-15: 30 mg via INTRAVENOUS

## 2018-09-15 MED ORDER — HYDROMORPHONE HCL 1 MG/ML IJ SOLN: 0.2500 mg | INTRAMUSCULAR | Status: DC | PRN

## 2018-09-15 MED ORDER — MEPERIDINE HCL 50 MG/ML IJ SOLN: 6.2500 mg | INTRAMUSCULAR | Status: DC | PRN

## 2018-09-15 MED ORDER — ONDANSETRON HCL 4 MG/2ML IJ SOLN: 4.0000 mg | Freq: Once | INTRAMUSCULAR | Status: DC | PRN

## 2018-09-15 MED ORDER — SODIUM CHLORIDE 0.9 % IV SOLN
INTRAVENOUS | Status: AC
  Filled 2018-09-15: qty 2

## 2018-09-15 SURGICAL SUPPLY — 64 items
BAG BANDED W/RUBBER/TAPE 36X54 (MISCELLANEOUS) IMPLANT
BAG DECANTER FOR FLEXI CONT (MISCELLANEOUS) IMPLANT
BAG EQP BAND 135X91 W/RBR TAPE (MISCELLANEOUS)
BLADE 10 SAFETY STRL DISP (BLADE) ×4 IMPLANT
BLADE CLIPPER SURG (BLADE) IMPLANT
BLADE OSCILLATING /SAGITTAL (BLADE) IMPLANT
BLADE STERNUM SYSTEM 6 (BLADE) IMPLANT
BNDG ADH 5X4 AIR PERM ELC (GAUZE/BANDAGES/DRESSINGS) ×2
BNDG COHESIVE 4X5 WHT NS (GAUZE/BANDAGES/DRESSINGS) ×4 IMPLANT
CANISTER SUCT 3000ML PPV (MISCELLANEOUS) ×4 IMPLANT
COVER BACK TABLE 60X90IN (DRAPES) ×4 IMPLANT
COVER SURGICAL LIGHT HANDLE (MISCELLANEOUS) ×2 IMPLANT
COVER WAND RF STERILE (DRAPES) ×4 IMPLANT
DEVICE LCKNG LEAD CARDIAC (CATHETERS) IMPLANT
DRAPE C-ARM 42X72 X-RAY (DRAPES) IMPLANT
DRAPE CARDIOVASCULAR INCISE (DRAPES) ×4
DRAPE CV SPLIT W-CLR ANES SCRN (DRAPES) ×2 IMPLANT
DRAPE HALF SHEET 40X57 (DRAPES) ×8 IMPLANT
DRAPE INCISE IOBAN 66X45 STRL (DRAPES) ×2 IMPLANT
DRAPE PERI GROIN 82X75IN TIB (DRAPES) ×2 IMPLANT
DRAPE SRG 135X102X78XABS (DRAPES) ×2 IMPLANT
ELECT REM PT RETURN 9FT ADLT (ELECTROSURGICAL) ×8
ELECTRODE REM PT RTRN 9FT ADLT (ELECTROSURGICAL) ×4 IMPLANT
FELT TEFLON 1X6 (MISCELLANEOUS) IMPLANT
GAUZE 4X4 16PLY RFD (DISPOSABLE) ×2 IMPLANT
GAUZE PACKING IODOFORM 2 (PACKING) ×2 IMPLANT
GAUZE SPONGE 4X4 12PLY STRL (GAUZE/BANDAGES/DRESSINGS) ×8 IMPLANT
GAUZE SPONGE 4X4 16PLY XRAY LF (GAUZE/BANDAGES/DRESSINGS) ×8 IMPLANT
GLOVE BIOGEL PI IND STRL 6.5 (GLOVE) IMPLANT
GLOVE BIOGEL PI IND STRL 7.5 (GLOVE) ×2 IMPLANT
GLOVE BIOGEL PI INDICATOR 6.5 (GLOVE) ×2
GLOVE BIOGEL PI INDICATOR 7.5 (GLOVE) ×4
GLOVE ECLIPSE 6.5 STRL STRAW (GLOVE) ×2 IMPLANT
GLOVE ECLIPSE 8.0 STRL XLNG CF (GLOVE) ×4 IMPLANT
GOWN STRL REUS W/ TWL LRG LVL3 (GOWN DISPOSABLE) ×2 IMPLANT
GOWN STRL REUS W/ TWL XL LVL3 (GOWN DISPOSABLE) ×2 IMPLANT
GOWN STRL REUS W/TWL LRG LVL3 (GOWN DISPOSABLE) ×4
GOWN STRL REUS W/TWL XL LVL3 (GOWN DISPOSABLE) ×4
KIT LEAD ACCESSORY 6056M PINCH (KITS) ×2 IMPLANT
KIT TURNOVER KIT B (KITS) ×4 IMPLANT
NDL PERC 18GX7CM (NEEDLE) IMPLANT
NEEDLE PERC 18GX7CM (NEEDLE) ×4 IMPLANT
NS IRRIG 1000ML POUR BTL (IV SOLUTION) IMPLANT
PAD ARMBOARD 7.5X6 YLW CONV (MISCELLANEOUS) ×8 IMPLANT
PAD ELECT DEFIB RADIOL ZOLL (MISCELLANEOUS) ×4 IMPLANT
REMOVAL LLD CARDIAC LEAD EZ (CATHETERS) ×8
SHEATH PINNACLE 6F 10CM (SHEATH) ×4 IMPLANT
SHEATH TIGHTRAIL MECH (SHEATH) ×1
SHEATH TIGHTRAIL MECH 11F (SHEATH) ×1 IMPLANT
SUT PROLENE 2 0 CT2 30 (SUTURE) IMPLANT
SUT PROLENE 2 0 SH DA (SUTURE) ×2 IMPLANT
SUT SILK 2 0 SH (SUTURE) ×2 IMPLANT
SUT VIC AB 2-0 CT2 18 VCP726D (SUTURE) IMPLANT
SUT VIC AB 3-0 X1 27 (SUTURE) IMPLANT
SWAB COLLECTION DEVICE MRSA (MISCELLANEOUS) ×2 IMPLANT
SWAB CULTURE ESWAB REG 1ML (MISCELLANEOUS) ×2 IMPLANT
TAPE CLOTH SURG 4X10 WHT LF (GAUZE/BANDAGES/DRESSINGS) ×2 IMPLANT
TOWEL OR 17X24 6PK STRL BLUE (TOWEL DISPOSABLE) ×8 IMPLANT
TOWEL OR 17X26 10 PK STRL BLUE (TOWEL DISPOSABLE) ×8 IMPLANT
TRAY FOLEY MTR SLVR 16FR STAT (SET/KITS/TRAYS/PACK) ×4 IMPLANT
TUBE CONNECTING 12'X1/4 (SUCTIONS)
TUBE CONNECTING 12X1/4 (SUCTIONS) IMPLANT
TUBING EXTENTION W/L.L. (IV SETS) ×4 IMPLANT
YANKAUER SUCT BULB TIP NO VENT (SUCTIONS) ×2 IMPLANT

## 2018-09-15 NOTE — Interval H&P Note (Signed)
History and Physical Interval Note:  09/15/2018 2:14 PM  William [name]  has presented today for surgery, with the diagnosis of ICD INFECTION  The various methods of treatment have been discussed with the patient and family. After consideration of risks, benefits and other options for treatment, the patient has consented to  Procedure(s) with comments: ICD LEAD REMOVAL EXTRACTION (N/A) - DR. BARTLE TO BACK UP TRANSESOPHAGEAL ECHOCARDIOGRAM (TEE) (N/A) as a surgical intervention .  The patient's history has been reviewed, patient examined, no change in status, stable for surgery.  I have reviewed the patient's chart and labs.  Questions were answered to the patient's satisfaction.     William [name]

## 2018-09-15 NOTE — Progress Notes (Signed)
[facility] Infusion Coordinator will follow pt with ID team to support home IV ABX upon DC as ordered/needed.  If patient discharges after hours, please call [phone].   William [name] 09/15/2018, 1:12 PM

## 2018-09-15 NOTE — Progress Notes (Addendum)
   Electrophysiology Rounding Note  Patient Name: William [name] Date of Encounter: 09/15/2018  Primary Cardiologist: [place]   Subjective   The patient is doing well today.  At this time, the patient denies chest pain, shortness of breath, or any new concerns.  Inpatient Medications    Scheduled Meds: . amiodarone  100 mg Oral Daily  . aspirin  81 mg Oral Daily  . chlorhexidine  60 mL Topical Once  . ezetimibe-simvastatin  1 tablet Oral QHS  . sodium chloride flush  3 mL Intravenous Q12H   Continuous Infusions: . sodium chloride 50 mL/hr at 09/15/18 0953  . meropenem (MERREM) IV 1 g (09/15/18 0541)   PRN Meds: acetaminophen, polyethylene glycol   Vital Signs    Vitals:   09/14/18 1515 09/14/18 1954 09/15/18 0533 09/15/18 0915  BP: 100/69 110/71 (!) 108/43 (!) 120/91  Pulse:   94   Resp: 13 (!) 23 18 16   Temp: 97.9 F (36.6 C) 98.7 F (37.1 C) 97.7 F (36.5 C) 97.7 F (36.5 C)  TempSrc: Oral Oral Oral Oral  SpO2: 99% 91% 98%   Weight:      Height:        Intake/Output Summary (Last 24 hours) at 09/15/2018 1155 Last data filed at 09/15/2018 0600 Gross per 24 hour  Intake 1983.56 ml  Output 800 ml  Net 1183.56 ml   Filed Weights   09/12/18 0500 09/13/18 0429 09/14/18 0555  Weight: 96.2 kg 94.1 kg 95.7 kg    Physical Exam    GEN- The patient is elderly and chronically ill appearing, alert and oriented x 3 today.   Head- normocephalic, atraumatic Eyes-  Sclera clear, conjunctiva pink Ears- hearing intact Oropharynx- clear Neck- supple Lungs- Clear to ausculation bilaterally, normal work of breathing Heart- Regular rate and rhythm  GI- soft, NT, ND, + BS Extremities- no clubbing, cyanosis, or edema Skin- no rash or lesion Psych- euthymic mood, full affect Neuro- strength and sensation are intact  Labs    CBC Recent Labs    09/14/18 0308 09/15/18 0343  WBC 4.8 4.3  NEUTROABS 2.7  --   HGB 11.3* 10.8*  HCT 33.0* 31.3*  MCV 95.4 95.7    PLT 120* 761*   Basic Metabolic Panel Recent Labs    09/14/18 0308 09/15/18 0343  NA 132* 133*  K 3.7 3.7  CL 100 101  CO2 24 24  GLUCOSE 120* 155*  BUN 20 17  CREATININE 0.91 1.01  CALCIUM 8.3* 8.1*  MG  --  2.4    Radiology    No results found.   Assessment & Plan    1.  Klebsiella bacteremia Plan for device system extraction today Reviewed with patient and daughter this morning  2.  Aortic stenosis S/p TAVR Stable No change required today     For questions or updates, please contact [place] Please consult [email or web address] for contact info under Cardiology/STEMI.  Signed, Chanetta Marshall, NP  09/15/2018, 11:55 AM   EP Attending  Patient seen and examined. Agree with above. The patient is stable this morning and is ready for ICD system extraction. I have answered all questions from his daughter and they wish to proceed.   Mikle Bosworth.D.

## 2018-09-15 NOTE — Progress Notes (Signed)
INFECTIOUS DISEASE PROGRESS NOTE  ID: William [name] is a 84 y.o. 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 Lovena Le. No complaints today.   Abtx:  Anti-infectives (From admission, onward)   Start     Dose/Rate Route Frequency Ordered Stop   09/14/18 1100  gentamicin (GARAMYCIN) 80 mg in sodium chloride 0.9 % 500 mL irrigation     80 mg Irrigation To Cath Lab 09/14/18 1056 09/15/18 1100   09/14/18 1100  ceFAZolin (ANCEF) IVPB 2g/100 mL premix     2 g 200 mL/hr over 30 Minutes Intravenous To Cath Lab 09/14/18 1056 09/15/18 1100   09/07/18 1300  meropenem (MERREM) 1 g in sodium chloride 0.9 % 100 mL IVPB     1 g 200 mL/hr over 30 Minutes Intravenous Every 8 hours 09/07/18 1232     09/05/18 1700  ceFEPIme (MAXIPIME) 2 g in sodium chloride 0.9 % 100 mL IVPB  Status:  Discontinued     2 g 200 mL/hr over 30 Minutes Intravenous Every 24 hours 09/04/18 1537 09/05/18 0619   09/05/18 1700  vancomycin (VANCOCIN) 1,250 mg in sodium chloride 0.9 % 250 mL IVPB  Status:  Discontinued     1,250 mg 166.7 mL/hr over 90 Minutes Intravenous Every 24 hours 09/04/18 1537 09/05/18 0619   09/05/18 0800  cefTRIAXone (ROCEPHIN) 2 g in sodium chloride 0.9 % 100 mL IVPB  Status:  Discontinued     2 g 200 mL/hr over 30 Minutes Intravenous Every 24 hours 09/05/18 0619 09/07/18 1232   09/04/18 1445  vancomycin (VANCOCIN) 2,000 mg in sodium chloride 0.9 % 500 mL IVPB     2,000 mg 250 mL/hr over 120 Minutes Intravenous  Once 09/04/18 1436  09/04/18 2148   09/04/18 1430  ceFEPIme (MAXIPIME) 2 g in sodium chloride 0.9 % 100 mL IVPB     2 g 200 mL/hr over 30 Minutes Intravenous  Once 09/04/18 1425 09/04/18 1657   09/04/18 1430  metroNIDAZOLE (FLAGYL) IVPB 500 mg  Status:  Discontinued     500 mg 100 mL/hr over 60 Minutes Intravenous Every 8 hours 09/04/18 1425 09/05/18 1544   09/04/18 1430  vancomycin (VANCOCIN) IVPB 1000 mg/200 mL premix  Status:  Discontinued     1,000 mg 200 mL/hr over 60 Minutes Intravenous  Once 09/04/18 1425 09/04/18 1436      Medications:  Scheduled: . amiodarone  100 mg Oral Daily  . aspirin  81 mg Oral Daily  . chlorhexidine  60 mL Topical Once  . ezetimibe-simvastatin  1 tablet Oral QHS  . gentamicin irrigation  80 mg Irrigation To Cath  . sodium chloride flush  3 mL Intravenous Q12H    Objective: Vital signs in last 24 hours: Temp:  [97.5 F (36.4 C)-98.7 F (37.1 C)] 97.7 F (36.5 C) (12/03 0915) Pulse Rate:  [94-102] 94 (12/03 0533) Resp:  [13-23] 16 (12/03 0915) BP: (95-120)/(43-91) 120/91 (12/03 0915) SpO2:  [91 %-99 %] 98 % (12/03 0533)   Physical Exam  Constitutional: He appears well-developed and well-nourished.  Resting  quietly.   HENT:  Mouth/Throat: Mucous membranes are normal. Normal dentition. No dental abscesses.  Cardiovascular: Normal rate, regular rhythm and normal heart sounds.  Pulmonary/Chest: Effort normal and breath sounds normal.  Abdominal: Soft. He exhibits no distension. There is no tenderness.  Neurological: He is alert.  Skin: Skin is warm and dry. No rash noted.    Lab Results Recent Labs    09/14/18 0308 09/15/18 0343  WBC 4.8 4.3  HGB 11.3* 10.8*  HCT 33.0* 31.3*  NA 132* 133*  K 3.7 3.7  CL 100 101  CO2 24 24  BUN 20 17  CREATININE 0.91 1.01   Liver Panel No results for input(s): PROT, ALBUMIN, AST, ALT, ALKPHOS, BILITOT, BILIDIR, IBILI in the last 72 hours. Sedimentation Rate No results for input(s): ESRSEDRATE in the last 72  hours. C-Reactive Protein No results for input(s): CRP in the last 72 hours.  Microbiology: Recent Results (from the past 240 hour(s))  Culture, blood (Routine X 2) w Reflex to ID Panel     Status: None   Collection Time: 09/06/18  8:12 AM  Result Value Ref Range Status   Specimen Description BLOOD LEFT ANTECUBITAL  Final   Special Requests   Final    BOTTLES DRAWN AEROBIC ONLY Blood Culture adequate volume   Culture   Final    NO GROWTH 5 DAYS Performed at [facility], [address] [place], [place] [address]    Report Status 09/11/2018 FINAL  Final  Surgical PCR screen     Status: None   Collection Time: 09/14/18  8:10 PM  Result Value Ref Range Status   MRSA, PCR NEGATIVE NEGATIVE Final   Staphylococcus aureus NEGATIVE NEGATIVE Final    Comment: (NOTE) The Xpert SA Assay (FDA approved for NASAL specimens in patients 19 years of age and older), is one component of a comprehensive surveillance program. It is not intended to diagnose infection nor to guide or monitor treatment. Performed at [facility], [place] [address], [place], [place] [address]     Studies/Results: No results found.   Assessment/Plan: ICD with vegetation on wire on TEE Klebsiella bacteremia (ESBL) Hx TAVR   Total days of antibiotics: 11 (merrem)  Repeat BCx 11-24 are negative.  Planned for pacer extraction on 12-3.  Will need anbx for 6 weeks afterwards. Please place PICC line tomorrow 12/-04 WBC normal, Cr stable, afebrile.   His wife is very nervous about home management - may need to consider placement for assistance.          Janene Madeira, MSN, NP-C [facility] for Infectious Disease [email or web address] Pager: [phone] Office: [phone]

## 2018-09-15 NOTE — Progress Notes (Signed)
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  Patient was recently admitted from 08/07/2018-08/13/2018 for septic shock secondary to Klebsiella UTI. He was admitted again from 08/18/2018-08/20/2018 for successful TAVR procedure.  In the ED he was found to have a pulse of 120, RR 26, temp 101.55F. Urinalysis was negative. CXR showed a right basilar opacity suggestive of atelectasis versus airspace disease/pneumonia. CT abdomen/pelvis with contrast showed uncomplicated cholelithiasis, 4.4 cm infrarenal AAA, bilateral simple and complex renal cyst, and colonic diverticulosis without acute diverticulitis.   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  Patient underwent 2D echo which showed a EF of 35 to 40%, diffuse hypokinesis, akinesis of the inferior septal myocardium, TAVR bioprosthesis present with no evidence of vegetation.  Patient status post TEE on 09/08/2018 concerning for vegetation to lead.  Patient seen in consultation by ID who  are recommending 6 weeks of IV meropenem.  Repeat blood cultures done 09/06/2018 with no growth to date.  Patient seen by cardiology, cardiothoracic surgery as well as the EP team and patient for device extraction today Tuesday, 09/15/2018.  Per ID continue IV antibiotics until lead extraction and then will need a total of 6 weeks of IV antibiotics post lead extraction.  PICC line to be placed post lead extraction.  Per ID they will change antibiotics to Invanz for ease of dosing at discharge.  ID and EP cardiology following.  Follow.  2.  Severe aortic stenosis status post TAVR 08/18/2018 Valve team following.  3.  Hypotension Hypotension improved.  Systolic blood pressure now 120/91.  IV fluids have been saline locked.  Follow.   4.  Chronic systolic heart failure/nonischemic cardiomyopathy Patient noted per 2D echo of 09/04/2018 12 a EF of 35 to 40%.  Patient currently euvolemic on examination.  Current weight is pending and was 95.7 kg(09/14/2018) from 94.1 kg from 96.2 kg from Weight on 09/10/2018 was 99.4 kg from 98.3 kg from 98.3 kg from 97.9 kg from 100.4 kg.  Monitor volume status closely.  Saline lock IV fluids.  Patient with a urine output not correctly recorded.  Foley catheter has been removed.  Per cardiology.  5.  Hyperlipidemia Stable.  Continue Vytorin.  Outpatient follow-up.   6.  Infrarenal AAA Patient with a 4.4 cm infrarenal fusiform AAA seen on CT abdomen and pelvis.  Followed by [facility].  7.  History of ventricular tachycardia status post ICD No episodes of V. tach during this hospitalization.  Continue amiodarone.  Patient has been evaluated by EP and patient for device extraction today 09/15/2018. Per EP.  8.  Anemia Patient with no overt bleeding.  Hemoglobin currently stable at 10.8.  Follow  H&H.  Transfusion threshold hemoglobin less than 7.  9.  Hypokalemia Repleted.   DVT prophylaxis: Lovenox Code Status: Full Family Communication: Updated patient and daughter at  bedside.  Disposition Plan: Remain on telemetry.  CIR when okay with cardiology.     Consultants:   Cardiothoracic surgery: Dr. Alvan Dame 09/04/2018  Infectious disease: Dr. Johnnye Sima 09/05/2018  Cardiology/valve team Dr. Angelena Form 09/08/2018  Cardiology: Dr. Debara Pickett 09/06/2018  Electrophysiology: Dr. Lovena Le 09/09/2018  Procedures:   CT abdomen and pelvis 09/04/2018  Chest x-ray 09/04/2018  2D echo 09/04/2018  Transesophageal echocardiogram 09/08/2018--- severe global reduction in LV systolic function: Status post AVR with trace periprosthetic AI, moderate MR,severe LAE, oscillating density on pacer wire concerning for vegetation.--- Per Dr. Stanford Breed  Antimicrobials:   IV cefepime 09/04/2018>>>> 09/05/2018  IV Rocephin 09/05/2018>>>>> 09/07/2018  IV Merrem 09/07/2018  IV vancomycin 09/04/2018>>>>> 09/05/2018   Subjective: Patient sleeping.  Easily arousable.  Denies any chest pain.  Denies any shortness of breath.  No abdominal pain.  Awaiting surgery.  Daughters at bedside.   Daughter with multiple questions concerning procedure and current source of infection.  Objective: Vitals:   09/14/18 1515 09/14/18 1954 09/15/18 0533 09/15/18 0915  BP: 100/69 110/71 (!) 108/43 (!) 120/91  Pulse:   94   Resp: 13 (!) 23 18 16   Temp: 97.9 F (36.6 C) 98.7 F (37.1 C) 97.7 F (36.5 C) 97.7 F (36.5 C)  TempSrc: Oral Oral Oral Oral  SpO2: 99% 91% 98%   Weight:      Height:        Intake/Output Summary (Last 24 hours) at 09/15/2018 1011 Last data filed at 09/15/2018 0600 Gross per 24 hour  Intake 1983.56 ml  Output 800 ml  Net 1183.56 ml   Filed Weights   09/12/18 0500 09/13/18 0429 09/14/18 0555  Weight: 96.2 kg 94.1 kg 95.7 kg    Examination:  General exam: Sleeping Respiratory system: CTA B anterior lung fields.  No wheezes, no crackles, no rhonchi. Normal respiratory effort.  Cardiovascular system: Regular rate rhythm no murmurs rubs or gallops.  No JVD.  Trace to 1+  bilateral lower extremity edema.  Gastrointestinal system: Abdomen is nontender, nondistended, soft, positive bowel sounds.  No rebound.  No guarding.   Central nervous system: Alert and oriented. No focal neurological deficits. Extremities: Symmetric 5 x 5 power. Skin: No rashes, lesions or ulcers Psychiatry: Judgement and insight appear fair. Mood & affect appropriate.     Data Reviewed: I have personally reviewed following labs and imaging studies  CBC: Recent Labs  Lab 09/09/18 0826 09/10/18 0317 09/11/18 0356 09/13/18 0253 09/14/18 0308 09/15/18 0343  WBC 4.2 4.2 5.1 4.9 4.8 4.3  NEUTROABS 2.7 2.4 3.1  --  2.7  --   HGB 11.4* 11.1* 11.1* 11.3* 11.3* 10.8*  HCT 33.0* 32.2* 32.9* 32.7* 33.0* 31.3*  MCV 97.3 96.7 96.5 95.6 95.4 95.7  PLT 106* 108* 111* 119* 120* 948*   Basic Metabolic Panel: Recent Labs  Lab 09/09/18 0826  09/11/18 0356 09/12/18 0247 09/13/18 0253 09/14/18 0308 09/15/18 0343  NA 132*   < > 133* 132* 131* 132* 133*  K 3.7   < > 3.8 3.4* 4.0 3.7 3.7  CL 103   < > 104 99 100 100 101  CO2 21*   < > 22 25 21* 24 24  GLUCOSE 126*   < > 130* 121* 124* 120* 155*  BUN 13   < > 13 16 20 20 17   CREATININE 0.92   < >  0.98 0.96 1.07 0.91 1.01  CALCIUM 8.2*   < > 8.5* 8.2* 8.2* 8.3* 8.1*  MG 2.2  --   --   --   --   --  2.4   < > = values in this interval not displayed.   GFR: Estimated Creatinine Clearance: 58.7 mL/min (by C-G formula based on SCr of 1.01 mg/dL). Liver Function Tests: Recent Labs  Lab 09/09/18 0826  AST 26  ALT 42  ALKPHOS 155*  BILITOT 1.4*  PROT 5.6*  ALBUMIN 2.5*   No results for input(s): LIPASE, AMYLASE in the last 168 hours. No results for input(s): AMMONIA in the last 168 hours. Coagulation Profile: No results for input(s): INR, PROTIME in the last 168 hours. Cardiac Enzymes: No results for input(s): CKTOTAL, CKMB, CKMBINDEX, TROPONINI in the last 168 hours. BNP (last 3 results) No results for input(s): PROBNP in the  last 8760 hours. HbA1C: No results for input(s): HGBA1C in the last 72 hours. CBG: No results for input(s): GLUCAP in the last 168 hours. Lipid Profile: No results for input(s): CHOL, HDL, LDLCALC, TRIG, CHOLHDL, LDLDIRECT in the last 72 hours. Thyroid Function Tests: No results for input(s): TSH, T4TOTAL, FREET4, T3FREE, THYROIDAB in the last 72 hours. Anemia Panel: No results for input(s): VITAMINB12, FOLATE, FERRITIN, TIBC, IRON, RETICCTPCT in the last 72 hours. Sepsis Labs: No results for input(s): PROCALCITON, LATICACIDVEN in the last 168 hours.  Recent Results (from the past 240 hour(s))  Culture, blood (Routine X 2) w Reflex to ID Panel     Status: None   Collection Time: 09/06/18  8:12 AM  Result Value Ref Range Status   Specimen Description BLOOD LEFT ANTECUBITAL  Final   Special Requests   Final    BOTTLES DRAWN AEROBIC ONLY Blood Culture adequate volume   Culture   Final    NO GROWTH 5 DAYS Performed at [facility], [address], [place], [place] [address]    Report Status 09/11/2018 FINAL  Final  Surgical PCR screen     Status: None   Collection Time: 09/14/18  8:10 PM  Result Value Ref Range Status   MRSA, PCR NEGATIVE NEGATIVE Final   Staphylococcus aureus NEGATIVE NEGATIVE Final    Comment: (NOTE) The Xpert SA Assay (FDA approved for NASAL specimens in patients 50 years of age and older), is one component of a comprehensive surveillance program. It is not intended to diagnose infection nor to guide or monitor treatment. Performed at [facility], [place] [address] [place], [place] [address]          Radiology Studies: No results found.      Scheduled Meds: . amiodarone  100 mg Oral Daily  . aspirin  81 mg Oral Daily  . chlorhexidine  60 mL Topical Once  . ezetimibe-simvastatin  1 tablet Oral QHS  . gentamicin irrigation  80 mg Irrigation To Cath  . sodium chloride flush  3 mL Intravenous Q12H   Continuous Infusions: . sodium  chloride 50 mL/hr at 09/15/18 0953  .  ceFAZolin (ANCEF) IV    . meropenem (MERREM) IV 1 g (09/15/18 0541)     LOS: 11 days    Time spent: 35 minutes    Irine Seal, MD [facility] Pager [phone]  If 7PM-7AM, please contact night-coverage [email or web address] Password [email or web address] 09/15/2018, 10:11 AM

## 2018-09-15 NOTE — Transfer of Care (Signed)
Immediate Anesthesia Transfer of Care Note  Patient: William [name]  Procedure(s) Performed: ICD LEAD REMOVAL EXTRACTION (N/A Chest) TRANSESOPHAGEAL ECHOCARDIOGRAM (TEE) (N/A )  Patient Location: PACU  Anesthesia Type:General  Level of Consciousness: awake and alert   Airway & Oxygen Therapy: Patient Spontanous Breathing and Patient connected to nasal cannula oxygen  Post-op Assessment: Report given to RN and Post -op Vital signs reviewed and stable  Post vital signs: Reviewed and stable  Last Vitals:  Vitals Value Taken Time  BP    Temp    Pulse    Resp    SpO2      Last Pain:  Vitals:   09/15/18 1215  TempSrc: Oral  PainSc:          Complications: No apparent anesthesia complications

## 2018-09-15 NOTE — Progress Notes (Signed)
PHARMACY CONSULT NOTE FOR:  OUTPATIENT  PARENTERAL ANTIBIOTIC THERAPY (OPAT)  Indication: ESBL Klebsiella endocarditis  Regimen: Ertapenem 1 gm every 24 hours  End date: 10/27/18  IV antibiotic discharge orders are pended. To discharging provider:  please sign these orders via discharge navigator,  Select New Orders & click on the button choice - Manage This Unsigned Work.     Thank you for allowing pharmacy to be a part of this patient's care.  Jimmy Footman, PharmD, BCPS, BCIDP Infectious Diseases Clinical Pharmacist Phone: [phone] 09/15/2018, 10:06 AM

## 2018-09-15 NOTE — Anesthesia Postprocedure Evaluation (Signed)
Anesthesia Post Note  Patient: Merel Santoli Mulvihill  Procedure(s) Performed: ICD LEAD REMOVAL EXTRACTION (N/A Chest) TRANSESOPHAGEAL ECHOCARDIOGRAM (TEE) (N/A )     Patient location during evaluation: PACU Anesthesia Type: General Level of consciousness: awake and alert Pain management: pain level controlled Vital Signs Assessment: post-procedure vital signs reviewed and stable Respiratory status: spontaneous breathing, nonlabored ventilation, respiratory function stable and patient connected to nasal cannula oxygen Cardiovascular status: blood pressure returned to baseline and stable Postop Assessment: no apparent nausea or vomiting Anesthetic complications: no    Last Vitals:  Vitals:   09/15/18 2013 09/15/18 2028  BP: (!) 79/68 99/72  Pulse: 96 98  Resp: (!) 24 (!) 22  Temp:    SpO2: 98% 99%    Last Pain:  Vitals:   09/15/18 2100  TempSrc:   PainSc: 0-No pain                 Mellina Benison DAVID

## 2018-09-15 NOTE — Anesthesia Preprocedure Evaluation (Addendum)
Anesthesia Evaluation  Patient identified by MRN, date of birth, ID band Patient awake    Reviewed: Allergy & Precautions, NPO status , Patient's Chart, lab work & pertinent test results  History of Anesthesia Complications Negative for: history of anesthetic complications  Airway Mallampati: II  TM Distance: >3 FB Neck ROM: Full    Dental  (+) Dental Advisory Given, Teeth Intact   Pulmonary asthma , sleep apnea (resolved after weight loss) ,    breath sounds clear to auscultation       Cardiovascular + CAD and +CHF  + Cardiac Defibrillator + Valvular Problems/Murmurs AS  Rhythm:Regular Rate:Normal   S/p TAVR 08/2018  '19 TEE - EF 25% to 30%. Diffuse hypokinesis. A bioprosthetic AV was present. Trivial AI. Moderate MR. LA was severely dilated. Increased thickness of the atrial septum, consistent with lipomatous hypertrophy.    Neuro/Psych PSYCHIATRIC DISORDERS Dementia negative neurological ROS     GI/Hepatic negative GI ROS, Neg liver ROS,   Endo/Other   Obesity   Renal/GU negative Renal ROS     Musculoskeletal  (+) Arthritis ,   Abdominal   Peds  Hematology negative hematology ROS (+)   Anesthesia Other Findings   Reproductive/Obstetrics                           Anesthesia Physical  Anesthesia Plan  ASA: IV  Anesthesia Plan: General   Post-op Pain Management:    Induction: Intravenous  PONV Risk Score and Plan: 2 and Ondansetron and Treatment may vary due to age or medical condition  Airway Management Planned: Natural Airway and Simple Face Mask  Additional Equipment: Arterial line, CVP and TEE  Intra-op Plan:   Post-operative Plan: Possible Post-op intubation/ventilation  Informed Consent: I have reviewed the patients History and Physical, chart, labs and discussed the procedure including the risks, benefits and alternatives for the proposed anesthesia with the  patient or authorized representative who has indicated his/her understanding and acceptance.   Dental advisory given  Plan Discussed with: CRNA and Anesthesiologist  Anesthesia Plan Comments: ( Femoral CVL by surgeon)      Anesthesia Quick Evaluation

## 2018-09-15 NOTE — Anesthesia Procedure Notes (Signed)
Procedure Name: Intubation Date/Time: 09/15/2018 4:54 PM Performed by: Eligha Bridegroom, CRNA Pre-anesthesia Checklist: Patient identified, Emergency Drugs available, Suction available, Timeout performed and Patient being monitored Patient Re-evaluated:Patient Re-evaluated prior to induction Oxygen Delivery Method: Circle system utilized Preoxygenation: Pre-oxygenation with 100% oxygen Induction Type: IV induction Ventilation: Mask ventilation without difficulty and Oral airway inserted - appropriate to patient size Grade View: Grade I Tube type: Oral Tube size: 7.5 mm Number of attempts: 1 Airway Equipment and Method: Stylet Placement Confirmation: ETT inserted through vocal cords under direct vision,  positive ETCO2 and breath sounds checked- equal and bilateral Secured at: 22 cm Tube secured with: Tape Dental Injury: Teeth and Oropharynx as per pre-operative assessment

## 2018-09-15 NOTE — Anesthesia Procedure Notes (Signed)
Arterial Line Insertion Start/End12/12/2017 4:12 PM, 09/15/2018 4:16 PM Performed by: Audry Pili, MD, anesthesiologist  Patient location: Pre-op. Preanesthetic checklist: patient identified, IV checked, risks and benefits discussed, surgical consent, monitors and equipment checked, pre-op evaluation, timeout performed and anesthesia consent Lidocaine 1% used for infiltration and patient sedated radial was placed Catheter size: 20 G Hand hygiene performed   Attempts: 1 Procedure performed using ultrasound guided technique. Ultrasound Notes:anatomy identified, needle tip was noted to be adjacent to the nerve/plexus identified, no ultrasound evidence of intravascular and/or intraneural injection and image(s) printed for medical record Following insertion, dressing applied and Biopatch. Post procedure assessment: unchanged and normal  Patient tolerated the procedure well with no immediate complications.

## 2018-09-15 NOTE — Op Note (Signed)
EP procedure note  Procedure performed: Extraction of a ICD system along with a previously capped defibrillator lead  Preoperative diagnosis: ICD lead endocarditis  Postoperative diagnosis: Same as preoperative diagnosis  Description of the procedure: After informed consent was obtained, the patient was taken to the operating room in the fasting state.  The anesthesia service was utilized to provide general endotracheal anesthesia as well as invasive hemodynamic monitoring with an arterial line in the left radial artery.  After the usual preparation draping and appropriate timeouts were performed, the right and left femoral veins were punctured and 6 French sheaths were placed to allow for central venous access.  Attention was then turned to extraction of the patient's ICD system.  A 5 cm incision was carried out over the old ICD insertion site and electrocautery utilized to dissect down to the ICD generator which were was removed with gentle traction.  Electrocautery was then utilized to free up the dense fibrous scar tissue including the new lead and the old 82 year old 37 dual coil lead.  Stylets were inserted into each lead.  The Spectranetics locking stylette was inserted into each lead.  The lead was locked in place.  Silk sutures were placed on the proximal portion of the lead for proximal control.  Initially the 84-year-old defibrillator lead was targeted for extraction.  A Spectranetics 11 French sub-C extraction sheath was advanced over the 9 [place] Medtronic defibrillation lead.  Utilizing traction and countertraction, pressure and counterpressure, the short extraction sheath was advanced down to the junction of the innominate vein and the superior vena cava.  Next the longer Spectranetics 11 [place] extraction sheath was advanced over the lead and eventually the lead was removed in total.  There was binding sites between the extracted lead and the older lead which were fairly difficult to  circumvent but ultimately successfully accomplished and the lead was removed in total.  There is no hemodynamic sequelae.  Attention was then turned to the older 6948 Medtronic lead.  After the Spectranetics locking stylette was inserted into this lead and locked in place the proximal portion of the lead was secured with silk suture.  The Spectranetics sub-C 63 [place] extraction sheath was then advanced over the lead down to the junction of the innominate vein and the superior vena cava.  The sub-C sheath was removed and the Spectranetics 11 [place] longer extraction sheath was inserted over the body of the lead and with a combination of pressure and counterpressure, traction and countertraction, the lead was removed in total.  There is no hemodynamic sequelae during the extraction procedure.  After the leads were removed pressure was held, and hemostasis was assured, and the pocket was irrigated and the incision closed with multiple mattress Prolene suture.  1 inch packing tape was inserted into the pocket.  A pressure dressing was applied.  The 6 French sheath were removed and gentle pressure applied to assure hemostasis.  The patient was returned to the recovery area for removal of his endotracheal tube.  Complications: There were no immediate procedure complications  Conclusion: Successful extraction of 2 ICD leads and a single-chamber ICD generator in a patient with ICD lead endocarditis.  Cristopher Peru, MD

## 2018-09-15 NOTE — H&P (View-Only) (Signed)
   Electrophysiology Rounding Note  Patient Name: William [name] Date of Encounter: 09/15/2018  Primary Cardiologist: [place]   Subjective   The patient is doing well today.  At this time, the patient denies chest pain, shortness of breath, or any new concerns.  Inpatient Medications    Scheduled Meds: . amiodarone  100 mg Oral Daily  . aspirin  81 mg Oral Daily  . chlorhexidine  60 mL Topical Once  . ezetimibe-simvastatin  1 tablet Oral QHS  . sodium chloride flush  3 mL Intravenous Q12H   Continuous Infusions: . sodium chloride 50 mL/hr at 09/15/18 0953  . meropenem (MERREM) IV 1 g (09/15/18 0541)   PRN Meds: acetaminophen, polyethylene glycol   Vital Signs    Vitals:   09/14/18 1515 09/14/18 1954 09/15/18 0533 09/15/18 0915  BP: 100/69 110/71 (!) 108/43 (!) 120/91  Pulse:   94   Resp: 13 (!) 23 18 16   Temp: 97.9 F (36.6 C) 98.7 F (37.1 C) 97.7 F (36.5 C) 97.7 F (36.5 C)  TempSrc: Oral Oral Oral Oral  SpO2: 99% 91% 98%   Weight:      Height:        Intake/Output Summary (Last 24 hours) at 09/15/2018 1155 Last data filed at 09/15/2018 0600 Gross per 24 hour  Intake 1983.56 ml  Output 800 ml  Net 1183.56 ml   Filed Weights   09/12/18 0500 09/13/18 0429 09/14/18 0555  Weight: 96.2 kg 94.1 kg 95.7 kg    Physical Exam    GEN- The patient is elderly and chronically ill appearing, alert and oriented x 3 today.   Head- normocephalic, atraumatic Eyes-  Sclera clear, conjunctiva pink Ears- hearing intact Oropharynx- clear Neck- supple Lungs- Clear to ausculation bilaterally, normal work of breathing Heart- Regular rate and rhythm  GI- soft, NT, ND, + BS Extremities- no clubbing, cyanosis, or edema Skin- no rash or lesion Psych- euthymic mood, full affect Neuro- strength and sensation are intact  Labs    CBC Recent Labs    09/14/18 0308 09/15/18 0343  WBC 4.8 4.3  NEUTROABS 2.7  --   HGB 11.3* 10.8*  HCT 33.0* 31.3*  MCV 95.4 95.7    PLT 120* 492*   Basic Metabolic Panel Recent Labs    09/14/18 0308 09/15/18 0343  NA 132* 133*  K 3.7 3.7  CL 100 101  CO2 24 24  GLUCOSE 120* 155*  BUN 20 17  CREATININE 0.91 1.01  CALCIUM 8.3* 8.1*  MG  --  2.4    Radiology    No results found.   Assessment & Plan    1.  Klebsiella bacteremia Plan for device system extraction today Reviewed with patient and daughter this morning  2.  Aortic stenosis S/p TAVR Stable No change required today     For questions or updates, please contact [place] Please consult [email or web address] for contact info under Cardiology/STEMI.  Signed, Chanetta Marshall, NP  09/15/2018, 11:55 AM   EP Attending  Patient seen and examined. Agree with above. The patient is stable this morning and is ready for ICD system extraction. I have answered all questions from his daughter and they wish to proceed.   Mikle Bosworth.D.

## 2018-09-16 ENCOUNTER — Encounter (HOSPITAL_COMMUNITY): Payer: Self-pay | Admitting: Internal Medicine

## 2018-09-16 ENCOUNTER — Inpatient Hospital Stay: Payer: Self-pay

## 2018-09-16 ENCOUNTER — Inpatient Hospital Stay (HOSPITAL_COMMUNITY): Payer: Medicare Other

## 2018-09-16 DIAGNOSIS — E785 Hyperlipidemia, unspecified: Secondary | ICD-10-CM

## 2018-09-16 LAB — CBC WITH DIFFERENTIAL/PLATELET
ABS IMMATURE GRANULOCYTES: 0.04 10*3/uL (ref 0.00–0.07)
Basophils Absolute: 0 10*3/uL (ref 0.0–0.1)
Basophils Relative: 1 %
EOS PCT: 5 %
Eosinophils Absolute: 0.3 10*3/uL (ref 0.0–0.5)
HCT: 31 % — ABNORMAL LOW (ref 39.0–52.0)
Hemoglobin: 10.8 g/dL — ABNORMAL LOW (ref 13.0–17.0)
Immature Granulocytes: 1 %
LYMPHS PCT: 12 %
Lymphs Abs: 0.8 10*3/uL (ref 0.7–4.0)
MCH: 33.6 pg (ref 26.0–34.0)
MCHC: 34.8 g/dL (ref 30.0–36.0)
MCV: 96.6 fL (ref 80.0–100.0)
Monocytes Absolute: 0.4 10*3/uL (ref 0.1–1.0)
Monocytes Relative: 6 %
Neutro Abs: 4.6 10*3/uL (ref 1.7–7.7)
Neutrophils Relative %: 75 %
Platelets: 121 10*3/uL — ABNORMAL LOW (ref 150–400)
RBC: 3.21 MIL/uL — ABNORMAL LOW (ref 4.22–5.81)
RDW: 15.4 % (ref 11.5–15.5)
WBC: 6.2 10*3/uL (ref 4.0–10.5)
nRBC: 0 % (ref 0.0–0.2)

## 2018-09-16 LAB — BASIC METABOLIC PANEL
Anion gap: 8 (ref 5–15)
BUN: 14 mg/dL (ref 8–23)
CO2: 24 mmol/L (ref 22–32)
Calcium: 8 mg/dL — ABNORMAL LOW (ref 8.9–10.3)
Chloride: 102 mmol/L (ref 98–111)
Creatinine, Ser: 0.87 mg/dL (ref 0.61–1.24)
GFR calc Af Amer: >60 mL/min (ref >60)
GFR calc non Af Amer: >60 mL/min (ref >60)
Glucose, Bld: 122 mg/dL — ABNORMAL HIGH (ref 70–99)
POTASSIUM: 4.1 mmol/L (ref 3.5–5.1)
Sodium: 134 mmol/L — ABNORMAL LOW (ref 135–145)

## 2018-09-16 NOTE — Progress Notes (Signed)
[facility] PROGRESS NOTE  William [name] GOT:[id number] DOB: 07/13/1928 DOA: 09/04/2018 PCP: Haywood Pao, MD   Narrative: 84 year old male History of chronic systolic heart failure EF 35-40%, severe AOS status post TAVR 08/18/2018 Tachycardia status post AICD AAA Prostate cancer Recurrent UTIs Degenerative arthritis Recent admission 10/25-07/3018 septic shock 2/2 Klebsiella UTI-readmitted 08/18/2018-08/20/2018 for TAVR Admit 09/04/2018 sudden shaking chills fatigue toxic metabolic encephalopathy Initial vitals consistent with possible sepsis pulse 120 BP 106/61 T-max 101.4 Lactic acid 3.6-portable CXR possible pneumonia    A & Plan ESBL bacteremia Klebsiella pacemaker lead vegetation and endocarditis-TEE 1126 concerning for vegetation-ID recommended 6 weeks meropenem-underwent device extraction 09/15/2018--Needs 6 weeks of antibiotics ending on 10/27/2018 will need to arrange outpatient infectious disease follow-up Severe aortic stenosis status post TAVR--resume aspirin 81 mg Sepsis physiology with hypotension resolved-only on amiodarone 100 daily Chronic systolic heart failure nonischemic cardiomyopathy-not on beta-blocker at this time AAA-CT scan this admission 4.4 infrarenal fusiform AAA Likely anemia chronic disease-hemoglobin 10 unclear why being transfused Hyperlipidemia-continue Vytorin 1 at bedtime  Lovenox Inpatient Awaiting therapy evaluation prior to discharge-no family present  Verlon Au, MD  [facility] Direct contact: [phone] --Via amion app OR  --[email or web address]; password [email or web address]  7PM-7AM contact night coverage as above 09/16/2018, 7:38 AM  LOS: 12 days   Consultants:   Cardiothoracic surgery: Dr. Alvan Dame 09/04/2018  Infectious disease: Dr. Johnnye Sima 09/05/2018  Cardiology/valve team Dr. Angelena Form 09/08/2018  Cardiology: Dr. Debara Pickett 09/06/2018  Electrophysiology: Dr. Lovena Le 09/09/2018  Procedures:   CT abdomen and pelvis  09/04/2018  Chest x-ray 09/04/2018  2D echo 09/04/2018  Transesophageal echocardiogram 09/08/2018--- severe global reduction in LV systolic function: Status post AVR with trace periprosthetic AI, moderate MR,severe LAE, oscillating density on pacer wire concerning for vegetation.--- Per Dr. Stanford Breed  Antimicrobials:   IV cefepime 09/04/2018>>>> 09/05/2018  IV Rocephin 09/05/2018>>>>> 09/07/2018  IV Merrem 09/07/2018  IV vancomycin 09/04/2018>>>>> 09/05/2018  Interval history/Subjective: Awake alert pleasant oriented no distress at this time Some constipation Otherwise seems fine  Objective:  Vitals:  Vitals:   09/16/18 0504 09/16/18 0644  BP: 106/81   Pulse: (!) 103   Resp: 20 (!) 23  Temp: 99.5 F (37.5 C)   SpO2: 98% 96%    Exam:  . Oriented no distress EOMI NCAT . EOMI NCAT neck soft supple no JVD . No pallor no icterus-no submandibular lymphadenopathy . S1-S2 no murmur area over leads is bandaged . Chest is clear without added sound . No lower extremity edema . Abdomen soft nontender no rebound no guarding   I have personally reviewed the following:   Labs: Marland Kitchen Sodium 134 potassium 4.1 . Hemoglobin 10.8 platelet 121  Imaging studies:  CXR from the same 12/4 is pending  Medical tests:  n   Test discussed with performing physician:  n  Decision to obtain old records:  n  Review and summation of old records:  n  Scheduled Meds: . amiodarone  100 mg Oral Daily  . aspirin  81 mg Oral Daily  . ezetimibe-simvastatin  1 tablet Oral QHS  . sodium chloride flush  3 mL Intravenous Q12H   Continuous Infusions: . 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

## 2018-09-16 NOTE — Progress Notes (Addendum)
Electrophysiology Rounding Note  Patient Name: William [name] Date of Encounter: 09/16/2018  Primary Cardiologist: Croitoru   Subjective   The patient is sleeping.  Some confusion overnight per daughter.   Inpatient Medications    Scheduled Meds: . amiodarone  100 mg Oral Daily  . aspirin  81 mg Oral Daily  . ezetimibe-simvastatin  1 tablet Oral QHS  . sodium chloride flush  3 mL Intravenous Q12H   Continuous Infusions: . meropenem (MERREM) IV 1 g (09/16/18 0610)   PRN Meds: acetaminophen, ondansetron (ZOFRAN) IV, polyethylene glycol   Vital Signs    Vitals:   09/16/18 0201 09/16/18 0412 09/16/18 0504 09/16/18 0644  BP: (!) 106/94 96/74 106/81   Pulse: (!) 102 (!) 104 (!) 103   Resp: 16 14 20  (!) 23  Temp: 98.1 F (36.7 C) 99.6 F (37.6 C) 99.5 F (37.5 C)   TempSrc: Axillary Axillary Axillary   SpO2: 99% 99% 98% 96%  Weight:    96.2 kg  Height:        Intake/Output Summary (Last 24 hours) at 09/16/2018 0709 Last data filed at 09/16/2018 0641 Gross per 24 hour  Intake 1220 ml  Output 1175 ml  Net 45 ml   Filed Weights   09/14/18 0555 09/15/18 1543 09/16/18 0644  Weight: 95.7 kg 95.7 kg 96.2 kg    Physical Exam    GEN- The patient is elderly appearing, sleeping Head- normocephalic, atraumatic Eyes-  Sclera clear, conjunctiva pink Ears- hearing intact Oropharynx- clear Neck- supple Lungs- Clear to ausculation bilaterally, normal work of breathing Heart- Irregular rate and rhythm GI- soft, NT, ND, + BS Extremities- no clubbing, cyanosis, or edema Skin- no rash or lesion, left chest pressure dressing Psych- euthymic mood, full affect Neuro- strength and sensation are intact  Labs    CBC Recent Labs    09/14/18 0308 09/15/18 0343 09/16/18 0515  WBC 4.8 4.3 6.2  NEUTROABS 2.7  --  4.6  HGB 11.3* 10.8* 10.8*  HCT 33.0* 31.3* 31.0*  MCV 95.4 95.7 96.6  PLT 120* 125* 161*   Basic Metabolic Panel Recent Labs    09/15/18 0343  09/16/18 0515  NA 133* 134*  K 3.7 4.1  CL 101 102  CO2 24 24  GLUCOSE 155* 122*  BUN 17 14  CREATININE 1.01 0.87  CALCIUM 8.1* 8.0*  MG 2.4  --     Telemetry    SR w PVC's (personally reviewed)  Radiology    Dg Chest Port 1 View  Result Date: 09/15/2018 CLINICAL DATA:  ICD lead removal.  Postop infection. EXAM: PORTABLE CHEST 1 VIEW COMPARISON:  09/04/2018 FINDINGS: Interval removal of left AICD. Prior aortic valve repair. No pneumothorax. Cardiomegaly. Bibasilar atelectasis. No visible effusions or overt edema. IMPRESSION: Interval removal of left AICD.  No pneumothorax. Cardiomegaly, bibasilar atelectasis. Electronically Signed   By: Rolm Baptise M.D.   On: 09/15/2018 21:05    Assessment & Plan    1.  Klebsiella bacteremia S/p extraction Will discuss pressure dressing removal with Dr Lovena Le Antibiotics per ID     For questions or updates, please contact [facility] Please consult [email or web address] for contact info under Cardiology/STEMI.  Signed, Chanetta Marshall, NP  09/16/2018, 7:09 AM   EP Attending  Patient seen and examined. He is s/p extraction of 2 ICD leads and an ICD generator for Klebsiella endocarditis. He is stable today and his ICD pocket looks stable. Today we will remove his bandaged, remove the packing, and place  a new pressure dressing. I would suggest he begin to ambulate and progress activities. He can be discharged home or to SNF when ok with primary team and ID service. I would not recommend a life vest at this time.   Mikle Bosworth.D.

## 2018-09-16 NOTE — Progress Notes (Signed)
Report received from [facility] from PACU.

## 2018-09-16 NOTE — Progress Notes (Addendum)
Physical Therapy Treatment Patient Details Name: William [name] MRN: [id number] DOB: 1928/01/26 Today's Date: 09/16/2018    History of Present Illness Pt is a 84 y.o. M with significant PMH of chronic systolic EF, ICM, severe AS s/p TAVR 11/5, hx of ventricular tachycardia s/p ICD, AAA, hyperlipidemia, prostate CA with recurrent UTI, who presents with sudden onset of fever, chills, fatigue. Recent admits 10/25-10/31 for septic shock secondary to UTI. Admitted 11/5-11/7 for TAVR. s/p ICD extraction 12/3     PT Comments    Patient reassessed s/p ICD extraction 12/3. Patient with some increased confusion and fatigue this session. Currently requiring two person maximal assistance for bed mobility and sit to stand transfers x 2 from edge of bed. HR 109-128 bpm. Transfer to chair deferred due to tachycardia and pt level of alertness. Pt family still interested/hopeful for CIR. Goals updated. D/c plan remains appropriate.    Follow Up Recommendations  CIR     Equipment Recommendations  Other (comment)(defer)    Recommendations for Other Services Rehab consult     Precautions / Restrictions Precautions Precautions: Fall Required Braces or Orthoses: Other Brace Other Brace: Right wrist splint from recent wrist dislocation per pt family; but can use walker with splint on per family Restrictions Weight Bearing Restrictions: No    Mobility  Bed Mobility Overal bed mobility: Needs Assistance Bed Mobility: Supine to Sit;Sit to Supine     Supine to sit: +2 for physical assistance;HOB elevated;Max assist Sit to supine: Max assist;+2 for physical assistance   General bed mobility comments: Patient with some movement initiation, but ultimately requiring maxA + 2 for supine <> sit for trunk and BLE assistance. Use of bed pad needed to scoot hips forward to edge of bed due to pt inability to perform reciprocal scooting with cues  Transfers Overall transfer level: Needs  assistance Equipment used: Rolling walker (2 wheeled) Transfers: Sit to/from Stand Sit to Stand: +2 physical assistance;Max assist         General transfer comment: Two trials of sit to stand from edge of bed. MaxA + 2 for powering up to stand from elevated bed surface. Unable to achieve fully upright positioning.  Ambulation/Gait                 Stairs             Wheelchair Mobility    Modified Rankin (Stroke Patients Only)       Balance Overall balance assessment: Needs assistance Sitting-balance support: Feet supported;Bilateral upper extremity supported Sitting balance-Leahy Scale: Poor Sitting balance - Comments: posterior lean with BUE support   Standing balance support: Bilateral upper extremity supported;During functional activity Standing balance-Leahy Scale: Poor Standing balance comment: heavy reliance on RW for upright support and increased external support                            Cognition Arousal/Alertness: Awake/alert Behavior During Therapy: WFL for tasks assessed/performed Overall Cognitive Status: History of cognitive impairments - at baseline                                 General Comments: Baseline dementia. Increased confusion noted this session      Exercises General Exercises - Lower Extremity Heel Slides: 5 reps;Both;Supine    General Comments        Pertinent Vitals/Pain Pain Assessment: Faces Faces Pain Scale: Hurts little  more Pain Location: general Pain Descriptors / Indicators: Grimacing Pain Intervention(s): Monitored during session    Home Living                      Prior Function            PT Goals (current goals can now be found in the care plan section) Acute Rehab PT Goals Patient Stated Goal: pt family wants him up in the chair PT Goal Formulation: With patient Time For Goal Achievement: 09/30/18 Potential to Achieve Goals: Fair    Frequency    Min  3X/week      PT Plan Current plan remains appropriate    Co-evaluation PT/OT/SLP Co-Evaluation/Treatment: Yes Reason for Co-Treatment: Complexity of the patient's impairments (multi-system involvement);Necessary to address cognition/behavior during functional activity;For patient/therapist safety;To address functional/ADL transfers PT goals addressed during session: Mobility/safety with mobility;Balance        AM-PAC PT "6 Clicks" Mobility   Outcome Measure  Help needed turning from your back to your side while in a flat bed without using bedrails?: A Lot Help needed moving from lying on your back to sitting on the side of a flat bed without using bedrails?: A Lot Help needed moving to and from a bed to a chair (including a wheelchair)?: A Lot Help needed standing up from a chair using your arms (e.g., wheelchair or bedside chair)?: A Lot Help needed to walk in hospital room?: A Lot Help needed climbing 3-5 steps with a railing? : Total 6 Click Score: 11    End of Session Equipment Utilized During Treatment: Gait belt Activity Tolerance: Patient limited by fatigue Patient left: with call bell/phone within reach;in bed;with bed alarm set;with family/visitor present Nurse Communication: Mobility status PT Visit Diagnosis: Unsteadiness on feet (R26.81);Other abnormalities of gait and mobility (R26.89);Muscle weakness (generalized) (M62.81)     Time: 1610-9604 PT Time Calculation (min) (ACUTE ONLY): 31 min  Charges:  $Therapeutic Activity: 8-22 mins                    Ellamae Sia, PT, DPT Acute Rehabilitation Services Pager [phone] Office [phone]   Willy Eddy 09/16/2018, 2:26 PM

## 2018-09-16 NOTE — Progress Notes (Signed)
Pharmacy Antibiotic Note  William [name] is a 84 y.o. 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 Now s/p ICD extraction on 12/3 with OPAT in for ertapenem, and plans to continue Merrem if staying for rehab.   SCr 0.87, CrCl 39mL/min, WBC wnl, AF  Plan: Continue Merrem 1g IV every 8 hours F/u Cx, renal function, ID recs and discharge plans   Height: 5\' 10"  (177.8 cm) Weight: 212 lb 1.3 oz (96.2 kg) IBW/kg (Calculated) : 73  Temp (24hrs), Avg:98.5 F (36.9 C), Min:97.7 F (36.5 C), Max:100.3 F (37.9 C)  Recent Labs  Lab 09/11/18 0356 09/12/18 0247 09/13/18 0253 09/14/18 0308 09/15/18 0343 09/16/18 0515  WBC 5.1  --  4.9 4.8 4.3 6.2  CREATININE 0.98 0.96 1.07 0.91 1.01 0.87    Estimated Creatinine Clearance: 68.3 mL/min (by C-G formula based on SCr of 0.87 mg/dL).    Allergies  Allergen Reactions  . Crestor [Rosuvastatin] Other (See Comments)    Hurts muscles  . Lipitor [Atorvastatin] Other (See Comments)    Hurts stomach     Cefepime 11/22 x1 CTX 11/23 >> 11/25 Vanc 11/22 >> 11/23 Flagyl 11/22 >> 11/23 Merrem 11/25 >>  11/22 BCx - ESBL Kleb pneumo/ornithinolytica (BCID Kleb pneumo) - sen cefepime/imipenem for ornithinolytica; pneumo sen imipenem (ESBL +) 11/23 Flu A/B PCR - negative 11/24 BCx - Neg 12/3 ICD wound Cx: sent  Bertis Ruddy, PharmD Clinical Pharmacist Please check AMION for all [place] numbers 09/16/2018 8:56 AM

## 2018-09-16 NOTE — Progress Notes (Signed)
A-line from left wrist removed, pt tolerated well. Cath tip intact. Pressure applied till hemostasis obtained. Gauze and medipore tape applied. Pt still confused, mittens to bilateral hand. Vitals stable. Will continue to monitor.

## 2018-09-16 NOTE — Progress Notes (Signed)
Pt received form PACU, AO to person. Disoriented to time, place, situation. Vials stable. A-line to left wrist intact, pulsatile blood flow. Aline connected .connected to tele. CCMD notified. Pt was pulling on ivs and wire in PACU per report.came in with  Mittens to bilateral hand. Vascular access site to bilateral groin, level zero. Pedal pulses palpable. Left chest wall has gauge dressing, intact with old drainage. Pt in bed rest till am. Pt's family by bedside. Foley cath intact, dranning clear yellow urine. Call bell within reach. Will continue to monitor.

## 2018-09-16 NOTE — Progress Notes (Signed)
Occupational Therapy Treatment Patient Details Name: William [name] MRN: [id number] DOB: 1928-09-28 Today's Date: 09/16/2018    History of present illness Pt is a 84 y.o. M with significant PMH of chronic systolic EF, ICM, severe AS s/p TAVR 11/5, hx of ventricular tachycardia s/p ICD, AAA, hyperlipidemia, prostate CA with recurrent UTI, who presents with sudden onset of fever, chills, fatigue. Recent admits 10/25-10/31 for septic shock secondary to UTI. Admitted 11/5-11/7 for TAVR. s/p ICD extraction 12/3    OT comments  Patient s/p ICD extraction 12/3. Patient with some increased confusion and fatigue this session and  requiring two person maximal A for bed mobility and sit to stand transfers x 2 from edge of bed. HR 109-128 bpm. Transfer to chair unable due to tachycardia and pt level of alertness. Pt family still hopeful for CIR after acute d/c   Follow Up Recommendations  CIR;Supervision/Assistance - 24 hour    Equipment Recommendations  Other (comment)(TBD at next venue of care)    Recommendations for Other Services Rehab consult    Precautions / Restrictions Precautions Precautions: Fall Required Braces or Orthoses: Other Brace Other Brace: Right wrist splint from recent wrist dislocation per pt family; but can use walker with splint on per family Restrictions Weight Bearing Restrictions: No       Mobility Bed Mobility Overal bed mobility: Needs Assistance Bed Mobility: Supine to Sit;Sit to Supine     Supine to sit: +2 for physical assistance;HOB elevated;Max assist Sit to supine: Max assist;+2 for physical assistance   General bed mobility comments: Patient with some movement initiation, but ultimately requiring maxA + 2 for supine <> sit for trunk and BLE assistance. Use of bed pad needed to scoot hips forward to edge of bed due to pt inability to perform reciprocal scooting with cues  Transfers Overall transfer level: Needs assistance Equipment used: Rolling  walker (2 wheeled) Transfers: Sit to/from Stand Sit to Stand: +2 physical assistance;Max assist         General transfer comment: Two trials of sit to stand from edge of bed. MaxA + 2 for powering up to stand from elevated bed surface.     Balance Overall balance assessment: Needs assistance Sitting-balance support: Feet supported;Bilateral upper extremity supported Sitting balance-Leahy Scale: Poor Sitting balance - Comments: posterior lean with BUE support Postural control: Other (comment)(anterior leaning) Standing balance support: Bilateral upper extremity supported;During functional activity Standing balance-Leahy Scale: Poor Standing balance comment: heavy reliance on RW for upright support and increased external support                           ADL either performed or assessed with clinical judgement   ADL Overall ADL's : Needs assistance/impaired                                       General ADL Comments: Two trials of sit to stand from edge of bed. Max A + 2 for powering up to stand from elevated bed surface in attempt to prep for transfers, however pt unable this session     Vision Baseline Vision/History: Wears glasses Wears Glasses: At all times     Perception     Praxis      Cognition Arousal/Alertness: Awake/alert Behavior During Therapy: [facility] for tasks assessed/performed Overall Cognitive Status: History of cognitive impairments - at baseline  General Comments: Baseline dementia. Increased confusion noted this session        Exercises Exercises: General Lower Extremity General Exercises - Lower Extremity Heel Slides: 5 reps;Both;Supine   Shoulder Instructions       General Comments      Pertinent Vitals/ Pain       Pain Assessment: Faces Faces Pain Scale: Hurts little more Pain Location: general Pain Descriptors / Indicators: Grimacing Pain Intervention(s): Monitored  during session;Repositioned  Home Living                                          Prior Functioning/Environment              Frequency  Min 3X/week        Progress Toward Goals  OT Goals(current goals can now be found in the care plan section)  Progress towards OT goals: OT to reassess next treatment  Acute Rehab OT Goals Patient Stated Goal: pt family wants him up in the chair  Plan Discharge plan remains appropriate    Co-evaluation    PT/OT/SLP Co-Evaluation/Treatment: Yes Reason for Co-Treatment: Complexity of the patient's impairments (multi-system involvement);To address functional/ADL transfers;For patient/therapist safety PT goals addressed during session: Mobility/safety with mobility;Balance OT goals addressed during session: ADL's and self-care;Proper use of Adaptive equipment and DME      AM-PAC OT "6 Clicks" Daily Activity     Outcome Measure   Help from another person eating meals?: A Little Help from another person taking care of personal grooming?: A Little Help from another person toileting, which includes using toliet, bedpan, or urinal?: A Lot Help from another person bathing (including washing, rinsing, drying)?: A Lot Help from another person to put on and taking off regular upper body clothing?: A Lot Help from another person to put on and taking off regular lower body clothing?: Total 6 Click Score: 13    End of Session Equipment Utilized During Treatment: Gait belt;Rolling walker  OT Visit Diagnosis: Unsteadiness on feet (R26.81);Other abnormalities of gait and mobility (R26.89);Muscle weakness (generalized) (M62.81);Other symptoms and signs involving cognitive function   Activity Tolerance Patient limited by fatigue   Patient Left with family/visitor present;in bed;with bed alarm set   Nurse Communication          Time: 5784-6962 OT Time Calculation (min): 31 min  Charges: OT General Charges $OT Visit: 1  Visit OT Treatments $Therapeutic Activity: 8-22 mins     Britt Bottom 09/16/2018, 2:52 PM

## 2018-09-16 NOTE — Progress Notes (Addendum)
Pressure dressing and packing removed.  New pressure dressing applied.  Chanetta Marshall, NP 09/16/2018 10:55 AM   Mikle Bosworth.D.

## 2018-09-17 LAB — CBC WITH DIFFERENTIAL/PLATELET
ABS IMMATURE GRANULOCYTES: 0.02 10*3/uL (ref 0.00–0.07)
Basophils Absolute: 0 10*3/uL (ref 0.0–0.1)
Basophils Relative: 0 %
Eosinophils Absolute: 0.5 10*3/uL (ref 0.0–0.5)
Eosinophils Relative: 8 %
HCT: 28.1 % — ABNORMAL LOW (ref 39.0–52.0)
HEMOGLOBIN: 9.8 g/dL — AB (ref 13.0–17.0)
Immature Granulocytes: 0 %
LYMPHS PCT: 17 %
Lymphs Abs: 1 10*3/uL (ref 0.7–4.0)
MCH: 33.1 pg (ref 26.0–34.0)
MCHC: 34.9 g/dL (ref 30.0–36.0)
MCV: 94.9 fL (ref 80.0–100.0)
Monocytes Absolute: 0.6 10*3/uL (ref 0.1–1.0)
Monocytes Relative: 9 %
NEUTROS ABS: 4 10*3/uL (ref 1.7–7.7)
NEUTROS PCT: 66 %
Platelets: 131 10*3/uL — ABNORMAL LOW (ref 150–400)
RBC: 2.96 MIL/uL — ABNORMAL LOW (ref 4.22–5.81)
RDW: 15.4 % (ref 11.5–15.5)
WBC: 6.1 10*3/uL (ref 4.0–10.5)
nRBC: 0 % (ref 0.0–0.2)

## 2018-09-17 LAB — BASIC METABOLIC PANEL
Anion gap: 9 (ref 5–15)
BUN: 16 mg/dL (ref 8–23)
CO2: 24 mmol/L (ref 22–32)
Calcium: 7.7 mg/dL — ABNORMAL LOW (ref 8.9–10.3)
Chloride: 99 mmol/L (ref 98–111)
Creatinine, Ser: 0.77 mg/dL (ref 0.61–1.24)
GFR calc Af Amer: >60 mL/min (ref >60)
GFR calc non Af Amer: >60 mL/min (ref >60)
Glucose, Bld: 121 mg/dL — ABNORMAL HIGH (ref 70–99)
Potassium: 3.6 mmol/L (ref 3.5–5.1)
Sodium: 132 mmol/L — ABNORMAL LOW (ref 135–145)

## 2018-09-17 MED ORDER — SODIUM CHLORIDE 0.9% FLUSH: 10.0000 mL | INTRAVENOUS | Status: DC | PRN

## 2018-09-17 NOTE — Progress Notes (Signed)
Peripherally Inserted Central Catheter/Midline Placement  The IV Nurse has discussed with the patient and/or persons authorized to consent for the patient, the purpose of this procedure and the potential benefits and risks involved with this procedure.  The benefits include less needle sticks, lab draws from the catheter, and the patient may be discharged home with the catheter. Risks include, but not limited to, infection, bleeding, blood clot (thrombus formation), and puncture of an artery; nerve damage and irregular heartbeat and possibility to perform a PICC exchange if needed/ordered by physician.  Alternatives to this procedure were also discussed.  Bard Power PICC patient education guide, fact sheet on infection prevention and patient information card has been provided to patient /or left at bedside.    PICC/Midline Placement Documentation  PICC Single Lumen 45/36/46 PICC Right Basilic 47 cm 0 cm (Active)  Indication for Insertion or Continuance of Line Home intravenous therapies (PICC only) 09/17/2018  6:45 PM  Exposed Catheter (cm) 0 cm 09/17/2018  6:45 PM  Site Assessment Clean;Dry;Intact 09/17/2018  6:45 PM  Line Status Flushed;Saline locked;Blood return noted 09/17/2018  6:45 PM  Dressing Type Transparent 09/17/2018  6:45 PM  Dressing Status Clean;Dry;Intact 09/17/2018  6:45 PM  Dressing Intervention New dressing 09/17/2018  6:45 PM  Dressing Change Due 09/24/18 09/17/2018  6:45 PM       Rajesh Wyss, Nicolette Bang 09/17/2018, 6:45 PM

## 2018-09-17 NOTE — Progress Notes (Signed)
[facility] PROGRESS NOTE  William [name] IZT:[id number] DOB: 1927/10/25 DOA: 09/04/2018 PCP: Haywood Pao, MD   Narrative: 84 year old male History of chronic systolic heart failure EF 35-40%, severe AOS status post TAVR 08/18/2018 Tachycardia status post AICD AAA Prostate cancer Recurrent UTIs Degenerative arthritis Recent admission 10/25-07/3018 septic shock 2/2 Klebsiella UTI-readmitted 08/18/2018-08/20/2018 for TAVR Admit 09/04/2018 sudden shaking chills fatigue toxic metabolic encephalopathy Initial vitals consistent with possible sepsis pulse 120 BP 106/61 T-max 101.4 Lactic acid 3.6-portable CXR possible pneumonia    A & Plan ESBL bacteremia Klebsiella pacemaker lead vegetation and endocarditis-TEE 1126 concerning for vegetation-ID recommended 6 weeks meropenem-underwent device extraction 09/15/2018--Needs 6 weeks of antibiotics ending on 10/27/2018--still awaiting picc line placement so not able to discharge to CIR today Severe aortic stenosis status post TAVR--resume aspirin 81 mg Sepsis physiology with hypotension resolved-only on amiodarone 100 daily Chronic systolic heart failure nonischemic cardiomyopathy-not on beta-blocker at this time AAA-CT scan this admission 4.4 infrarenal fusiform AAA Likely anemia chronic disease-hemoglobin stable at this time Hyperlipidemia-continue Vytorin 1 at bedtime  Lovenox Inpatient Awaiting therapy evaluation prior to discharge-no family present  Verlon Au, MD  [facility] Direct contact: [phone] --Via amion app OR  --[email or web address]; password [email or web address]  7PM-7AM contact night coverage as above 09/17/2018, 5:03 PM  LOS: 13 days   Consultants:   Cardiothoracic surgery: Dr. Alvan Dame 09/04/2018  Infectious disease: Dr. Johnnye Sima 09/05/2018  Cardiology/valve team Dr. Angelena Form 09/08/2018  Cardiology: Dr. Debara Pickett 09/06/2018  Electrophysiology: Dr. Lovena Le 09/09/2018  Procedures:   CT abdomen and pelvis  09/04/2018  Chest x-ray 09/04/2018  2D echo 09/04/2018  Transesophageal echocardiogram 09/08/2018--- severe global reduction in LV systolic function: Status post AVR with trace periprosthetic AI, moderate MR,severe LAE, oscillating density on pacer wire concerning for vegetation.--- Per Dr. Stanford Breed  Antimicrobials:   IV cefepime 09/04/2018>>>> 09/05/2018  IV Rocephin 09/05/2018>>>>> 09/07/2018  IV Merrem 09/07/2018  IV vancomycin 09/04/2018>>>>> 09/05/2018  Interval history/Subjective:  Fair no new issues  Objective:  Vitals:  Vitals:   09/17/18 1100 09/17/18 1353  BP:  110/83  Pulse:  99  Resp:  (!) 93  Temp:  97.7 F (36.5 C)  SpO2: (!) 79% 100%    Exam:  . Oriented no distress EOMI NCAT . EOMI NCAT neck soft supple no JVD . No pallor no icterus-no submandibular lymphadenopathy . S1-S2 no murmur area over leads is bandaged . Chest is clear without added sound . No lower extremity edema . Abdomen soft nontender no rebound no guarding   I have personally reviewed the following:   Labs: Marland Kitchen Sodium 134->132 potassium 4.1->3.6 . Hemoglobin 10.8->9.8 platelet 121->131  Imaging studies:    Medical tests:  n   Test discussed with performing physician:  n  Decision to obtain old records:  n  Review and summation of old records:  n  Scheduled Meds: . amiodarone  100 mg Oral Daily  . aspirin  81 mg Oral Daily  . ezetimibe-simvastatin  1 tablet Oral QHS  . sodium chloride flush  3 mL Intravenous Q12H   Continuous Infusions: . 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

## 2018-09-17 NOTE — Progress Notes (Addendum)
SR on telemetry Patient resting comfortably, pressure dressing remains in place D/W Dr. Lovena Le OK to remove pressure dressing prior to discharge (looks like planned for CIR) if planned to go today, otherwise off tomorrow.  No need to replace with another dressing. OK to resume Plavix today. Follow up for wound care/suture removal is in place, as well as Dr. Sallyanne Kuster.  EP remains available, please recall if needed.  Tommye Standard, PA-C  EP Attending  Agree with above.   Mikle Bosworth.D.

## 2018-09-17 NOTE — Progress Notes (Signed)
At patient bedside to get consent.  Patient very drowsy and unable to remain awake during the explanation of PICC risk and benefit.  Personal sitter at bedside stating that patient just ate and just now got sleepy.  Sitter gave Probation officer patient's daughter's name and number Ebony Hail [phone]) to call for consent.  Attempted to call but no answer and phone mailbox is full unable to leave message.  Adrienne, RN updated and aware that PICC cannot be placed at this time due to consent issues.  Will return later.  Carolee Rota, RN VAST

## 2018-09-17 NOTE — Discharge Instructions (Signed)
Surgery site care Keep incision clean and dry, no showers until wound check visit and cleared. You can remove outer dressing tomorrow. Leave steri-strips (little pieces of tape) on until seen in the office for wound check appointment. Call the office [phone]) for redness, drainage, swelling, or fever.

## 2018-09-17 NOTE — Progress Notes (Signed)
IP rehab admissions - patient with confusion and fatigue post lead extraction.  Requiring max assist +2 for mobility.  Not ready for inpatient rehab at this point.  I will have my partner follow up tomorrow.  Call me for questions.  [phone]

## 2018-09-18 LAB — TYPE AND SCREEN
ABO/RH(D): A POS
Antibody Screen: NEGATIVE
Unit division: 0
Unit division: 0
Unit division: 0
Unit division: 0

## 2018-09-18 LAB — BPAM RBC
Blood Product Expiration Date: 201912232359
Blood Product Expiration Date: 201912232359
Blood Product Expiration Date: 201912242359
Blood Product Expiration Date: 201912242359
Unit Type and Rh: 6200
Unit Type and Rh: 6200
Unit Type and Rh: 6200
Unit Type and Rh: 6200

## 2018-09-18 LAB — BASIC METABOLIC PANEL
Anion gap: 9 (ref 5–15)
BUN: 14 mg/dL (ref 8–23)
CO2: 24 mmol/L (ref 22–32)
Calcium: 7.8 mg/dL — ABNORMAL LOW (ref 8.9–10.3)
Chloride: 102 mmol/L (ref 98–111)
Creatinine, Ser: 0.88 mg/dL (ref 0.61–1.24)
GFR calc Af Amer: >60 mL/min (ref >60)
GFR calc non Af Amer: >60 mL/min (ref >60)
Glucose, Bld: 171 mg/dL — ABNORMAL HIGH (ref 70–99)
Potassium: 3.5 mmol/L (ref 3.5–5.1)
SODIUM: 135 mmol/L (ref 135–145)

## 2018-09-18 LAB — CBC WITH DIFFERENTIAL/PLATELET
Abs Immature Granulocytes: 0.03 10*3/uL (ref 0.00–0.07)
Basophils Absolute: 0 10*3/uL (ref 0.0–0.1)
Basophils Relative: 0 %
EOS PCT: 8 %
Eosinophils Absolute: 0.5 10*3/uL (ref 0.0–0.5)
HCT: 29 % — ABNORMAL LOW (ref 39.0–52.0)
Hemoglobin: 9.9 g/dL — ABNORMAL LOW (ref 13.0–17.0)
Immature Granulocytes: 1 %
Lymphocytes Relative: 11 %
Lymphs Abs: 0.7 10*3/uL (ref 0.7–4.0)
MCH: 32.9 pg (ref 26.0–34.0)
MCHC: 34.1 g/dL (ref 30.0–36.0)
MCV: 96.3 fL (ref 80.0–100.0)
MONO ABS: 0.4 10*3/uL (ref 0.1–1.0)
Monocytes Relative: 7 %
Neutro Abs: 4.6 10*3/uL (ref 1.7–7.7)
Neutrophils Relative %: 73 %
Platelets: 129 10*3/uL — ABNORMAL LOW (ref 150–400)
RBC: 3.01 MIL/uL — ABNORMAL LOW (ref 4.22–5.81)
RDW: 15.3 % (ref 11.5–15.5)
WBC: 6.3 10*3/uL (ref 4.0–10.5)
nRBC: 0 % (ref 0.0–0.2)

## 2018-09-18 MED ORDER — SENNA 8.6 MG PO TABS: 1.0000 | ORAL_TABLET | Freq: Every day | ORAL | 0 refills | Status: DC

## 2018-09-18 MED ORDER — CLOPIDOGREL BISULFATE 75 MG PO TABS
75.0000 mg | ORAL_TABLET | Freq: Every day | ORAL | Status: DC
  Administered 2018-09-18 – 2018-09-21 (×4): 75 mg via ORAL
  Filled 2018-09-18 (×4): qty 1

## 2018-09-18 MED ORDER — ERTAPENEM IV (FOR PTA / DISCHARGE USE ONLY): 1.0000 g | INTRAVENOUS | 0 refills | Status: AC

## 2018-09-18 MED ORDER — METOPROLOL SUCCINATE ER 25 MG PO TB24: 25.0000 mg | ORAL_TABLET | Freq: Every day | ORAL | Status: DC

## 2018-09-18 MED ORDER — ACETAMINOPHEN 325 MG PO TABS: 325.0000 mg | ORAL_TABLET | ORAL | Status: DC | PRN

## 2018-09-18 NOTE — Progress Notes (Signed)
CSW attempted to get PSARR which is required if using insurance or medicare for SNF placment. PSARR needed to have the patient's SSN verified before they could approve. CSW informed family verification of his SSN was needed for approval of SNF placement. Patient's wife states she has his information at home and would return with his SSN card.    Thurmond Butts, [place] Social Worker [phone]

## 2018-09-18 NOTE — Progress Notes (Addendum)
Pressure dressing removed Site looks great, no bleeding, sutures in place, no hematoma I covered with a loose 4x4/tape to avoid the patient picking at the sutures/site, can change/replace as needed, though dressing not required otherwise  Plavix resumed  F/u is in place for wound check and cardiology.  EP service remains available, please recall if needed.  Tommye Standard, PA-C  EP Attending  Agree with above.   Mikle Bosworth.D.

## 2018-09-18 NOTE — Progress Notes (Signed)
Physical Therapy Treatment Patient Details Name: William [name] MRN: [id number] DOB: 02-12-1928 Today's Date: 09/18/2018    History of Present Illness Pt is a 84 y.o. M with significant PMH of chronic systolic EF, ICM, severe AS s/p TAVR 11/5, hx of ventricular tachycardia s/p ICD, AAA, hyperlipidemia, prostate CA with recurrent UTI, who presents with sudden onset of fever, chills, fatigue. Recent admits 10/25-10/31 for septic shock secondary to UTI. Admitted 11/5-11/7 for TAVR. s/p ICD extraction 12/3     PT Comments    Pt seen for additional visit to assist RN/NT with transfer back to bed. Pt had been continuously sliding anteriorly in the recliner despite frequent repositioning. Requiring three person maximal assistance to transfer via lateral scoot from bed to chair. Noted CIR denial; updated discharge plan to SNF.    Follow Up Recommendations  SNF     Equipment Recommendations  Other (comment)(defer)    Recommendations for Other Services       Precautions / Restrictions Precautions Precautions: Fall Required Braces or Orthoses: Other Brace Other Brace: Right wrist splint from recent wrist dislocation per pt family; but can use walker with splint on per family Restrictions Weight Bearing Restrictions: No    Mobility  Bed Mobility Overal bed mobility: Needs Assistance Bed Mobility: Sit to Supine;Rolling Rolling: Max assist     Sit to supine: Max assist;+2 for physical assistance   General bed mobility comments: maxA + 2 for sit > supine. also rolling to left with maxA for peri care  Transfers Overall transfer level: Needs assistance   Transfers: Lateral/Scoot Transfers          Lateral/Scoot Transfers: Max assist(+3) General transfer comment: Pt requiring maxA + 3 for lateral scoot transfer from chair to bed towards left. Pt with decreased initiation despite max cueing  Ambulation/Gait                 Stairs             Wheelchair  Mobility    Modified Rankin (Stroke Patients Only)       Balance Overall balance assessment: Needs assistance Sitting-balance support: Feet supported;Bilateral upper extremity supported Sitting balance-Leahy Scale: Poor   Postural control: Posterior lean                                  Cognition Arousal/Alertness: Awake/alert Behavior During Therapy: WFL for tasks assessed/performed Overall Cognitive Status: History of cognitive impairments - at baseline                                 General Comments: Baseline dementia.       Exercises      General Comments        Pertinent Vitals/Pain Pain Assessment: Faces Faces Pain Scale: Hurts little more Pain Location: general Pain Descriptors / Indicators: Grimacing Pain Intervention(s): Monitored during session    Home Living                      Prior Function            PT Goals (current goals can now be found in the care plan section) Acute Rehab PT Goals Patient Stated Goal: pt family wants him up in the chair Potential to Achieve Goals: Fair    Frequency    Min 3X/week  PT Plan Current plan remains appropriate    Co-evaluation              AM-PAC PT "6 Clicks" Mobility   Outcome Measure  Help needed turning from your back to your side while in a flat bed without using bedrails?: A Lot Help needed moving from lying on your back to sitting on the side of a flat bed without using bedrails?: A Lot Help needed moving to and from a bed to a chair (including a wheelchair)?: A Lot Help needed standing up from a chair using your arms (e.g., wheelchair or bedside chair)?: A Lot Help needed to walk in hospital room?: A Lot Help needed climbing 3-5 steps with a railing? : Total 6 Click Score: 11    End of Session Equipment Utilized During Treatment: Gait belt Activity Tolerance: Patient limited by fatigue Patient left: with call bell/phone within reach;in  bed;with nursing/sitter in room Nurse Communication: Mobility status PT Visit Diagnosis: Unsteadiness on feet (R26.81);Other abnormalities of gait and mobility (R26.89);Muscle weakness (generalized) (M62.81)     Time: 4643-1427 PT Time Calculation (min) (ACUTE ONLY): 12 min  Charges:  $Therapeutic Activity: 8-22 mins                    Ellamae Sia, PT, DPT Acute Rehabilitation Services Pager [phone] Office [phone]   Willy Eddy 09/18/2018, 1:50 PM

## 2018-09-18 NOTE — Plan of Care (Signed)
  Problem: Fluid Volume: Goal: Hemodynamic stability will improve Outcome: Progressing   

## 2018-09-18 NOTE — Progress Notes (Signed)
Physical Therapy Treatment Patient Details Name: William [name] MRN: [id number] DOB: 1928-02-26 Today's Date: 09/18/2018    History of Present Illness Pt is a 84 y.o. M with significant PMH of chronic systolic EF, ICM, severe AS s/p TAVR 11/5, hx of ventricular tachycardia s/p ICD, AAA, hyperlipidemia, prostate CA with recurrent UTI, who presents with sudden onset of fever, chills, fatigue. Recent admits 10/25-10/31 for septic shock secondary to UTI. Admitted 11/5-11/7 for TAVR. s/p ICD extraction 12/3     PT Comments    Patient progressing slowly towards his goals. Pt wife reports pt cognition back to baseline. During session, pt following simple commands ~75% of the time with max cueing to redirect attention. Continues with decreased attention and poor safety awareness. Patient requiring two person maximal assistance to boost up to standing to [place] and transferred with Stedy from bed to chair. Pt displays deconditioning/debility, decreased activity tolerance, and poor balance. Pt wife continues to advocate for CIR. Pt will need post acute rehab at discharge based on deficits listed above and to decrease caregiver burden.    Follow Up Recommendations  Post Acute Rehab (pt wife wanting CIR)     Equipment Recommendations  Other (comment)(defer)    Recommendations for Other Services       Precautions / Restrictions Precautions Precautions: Fall Required Braces or Orthoses: Other Brace Other Brace: Right wrist splint from recent wrist dislocation per pt family; but can use walker with splint on per family Restrictions Weight Bearing Restrictions: No    Mobility  Bed Mobility Overal bed mobility: Needs Assistance Bed Mobility: Supine to Sit     Supine to sit: +2 for physical assistance;HOB elevated;Max assist Sit to supine: Max assist;+2 for physical assistance   General bed mobility comments: Patient with good movement initiation, but ultimately gets distracted and needs  multimodal cues for redirection. MaxA + 2 for elevating trunk, bringing BLE's off edge of bed, and use of bed pad to scoot hips forward  Transfers Overall transfer level: Needs assistance Equipment used: Ambulation equipment used Transfers: Sit to/from Stand Sit to Stand: +2 physical assistance;Max assist         General transfer comment: Pt requiring maxA + 2 to boost up to [facility] with max verbal cueing for looking up and maintaining hand placement on stedy. Pt with decreased glute/truncal activation which promotes slumped posture in [place]  Ambulation/Gait                 Stairs             Wheelchair Mobility    Modified Rankin (Stroke Patients Only)       Balance Overall balance assessment: Needs assistance Sitting-balance support: Feet supported;Bilateral upper extremity supported Sitting balance-Leahy Scale: Poor Sitting balance - Comments: initially requiring maxA to maintain upright due to posterior lean. Able to achieve anterior weight shift with Stedy Postural control: Posterior lean Standing balance support: Bilateral upper extremity supported Standing balance-Leahy Scale: Zero Standing balance comment: stedy utilized                            Cognition Arousal/Alertness: Awake/alert Behavior During Therapy: WFL for tasks assessed/performed Overall Cognitive Status: History of cognitive impairments - at baseline                                 General Comments: Baseline dementia.  Exercises      General Comments        Pertinent Vitals/Pain Pain Assessment: Faces Faces Pain Scale: Hurts little more Pain Location: general Pain Descriptors / Indicators: Grimacing Pain Intervention(s): Monitored during session    Home Living                      Prior Function            PT Goals (current goals can now be found in the care plan section) Acute Rehab PT Goals Patient Stated Goal: pt family  wants him up in the chair Potential to Achieve Goals: Fair    Frequency    Min 3X/week      PT Plan Current plan remains appropriate    Co-evaluation              AM-PAC PT "6 Clicks" Mobility   Outcome Measure  Help needed turning from your back to your side while in a flat bed without using bedrails?: A Lot Help needed moving from lying on your back to sitting on the side of a flat bed without using bedrails?: A Lot Help needed moving to and from a bed to a chair (including a wheelchair)?: A Lot Help needed standing up from a chair using your arms (e.g., wheelchair or bedside chair)?: A Lot Help needed to walk in hospital room?: A Lot Help needed climbing 3-5 steps with a railing? : Total 6 Click Score: 11    End of Session Equipment Utilized During Treatment: Gait belt Activity Tolerance: Patient limited by fatigue Patient left: in chair;with call bell/phone within reach;with family/visitor present Nurse Communication: Mobility status PT Visit Diagnosis: Unsteadiness on feet (R26.81);Other abnormalities of gait and mobility (R26.89);Muscle weakness (generalized) (M62.81)     Time: 7915-0569 PT Time Calculation (min) (ACUTE ONLY): 32 min  Charges:  $Therapeutic Activity: 23-37 mins                     Ellamae Sia, [place], DPT Acute Rehabilitation Services Pager [phone] Office [phone]    Willy Eddy 09/18/2018, 9:48 AM

## 2018-09-18 NOTE — NC FL2 (Signed)
[place] LEVEL OF CARE SCREENING TOOL     IDENTIFICATION  Patient Name: William [name] Birthdate: [birth year] Sex: male Admission Date (Current Location): 09/04/2018  [facility] [place] Number:  Herbalist and Address:  [facility] [place], [place] [address], [place], [place] [address]      Provider Number: [id number]  Attending Physician Name and Address:  Nita Sells, MD  Relative Name and Phone Number:  Jimie Kuwahara [phone]    Current Level of Care: Hospital Recommended Level of Care: [place] Prior Approval Number:    Date Approved/Denied:   PASRR Number:    Discharge Plan: SNF    Current Diagnoses: Patient Active Problem List   Diagnosis Date Noted  . Acute bacterial endocarditis   . Infected defibrillator ([place])   . Bacteremia due to Klebsiella pneumoniae   . Sepsis ([place]) 09/04/2018  . S/P TAVR (transcatheter aortic valve replacement) 08/18/2018  . Ventricular tachycardia ([place])   . Dementia ([place])   . Chronic systolic CHF (congestive heart failure) ([place]) 08/07/2018  . Severe aortic stenosis   . Acute on chronic systolic heart failure ([place]) 07/02/2018  . Prostate cancer ([place]) 01/20/2014  . S/P ICD (internal cardiac defibrillator) procedure, 01/11/14 removal of ERI gen and placement of Medtronic Evera XT VR & NEW Right ventricular lead Medtronic 01/12/2014  . Nonischemic cardiomyopathy ([place]) 05/28/2013  . Hyperlipidemia 05/28/2013  . AAA (abdominal aortic aneurysm) ([place]) 05/28/2013    Orientation RESPIRATION BLADDER Height & Weight     Self, Time, Situation, Place  Normal Incontinent(external catheter) Weight: 213 lb 13.5 oz (97 kg) Height:  5\' 10"  (177.8 cm)  BEHAVIORAL SYMPTOMS/MOOD NEUROLOGICAL BOWEL NUTRITION STATUS      Continent Diet(see discharge summary )  AMBULATORY STATUS COMMUNICATION OF NEEDS Skin     Verbally (incision(closed), chest; left, incision(cl;osed) groin; left,  incision(closed) groin, right)                       Personal Care Assistance Level of Assistance  Bathing, Feeding, Dressing Bathing Assistance: Maximum assistance Feeding assistance: Limited assistance Dressing Assistance: Maximum assistance     Functional Limitations Info  Sight, Hearing, Speech Sight Info: Adequate Hearing Info: Adequate Speech Info: Adequate    SPECIAL CARE FACTORS FREQUENCY  PT (By licensed PT)     PT Frequency: 3x per week  OT Frequency: 3x per week            Contractures Contractures Info: Not present    Additional Factors Info  Code Status, Allergies, Isolation Precautions Code Status Info: FULL Allergies Info: Crestor Rosuvastatin, Lipitor Atorvastatin           Current Medications (09/18/2018):  This is the current hospital active medication list Current Facility-Administered Medications  Medication Dose Route Frequency Provider Last Rate Last Dose  . acetaminophen (TYLENOL) tablet 650 mg  650 mg Oral Q6H PRN Lelon Perla, MD   650 mg at 09/16/18 0910  . amiodarone (PACERONE) tablet 100 mg  100 mg Oral Daily Lelon Perla, MD   100 mg at 09/18/18 0949  . aspirin chewable tablet 81 mg  81 mg Oral Daily Lelon Perla, MD   81 mg at 09/18/18 0949  . clopidogrel (PLAVIX) tablet 75 mg  75 mg Oral Daily Baldwin [place], PA-C   75 mg at 09/18/18 5732  . ezetimibe-simvastatin (VYTORIN) 10-20 MG per tablet 1 tablet  1 tablet Oral QHS Lelon Perla,  MD   1 tablet at 09/17/18 2126  . meropenem (MERREM) 1 g in sodium chloride 0.9 % 100 mL IVPB  1 g Intravenous Q8H Susa Raring, RPH 200 mL/hr at 09/18/18 0552 1 g at 09/18/18 0552  . ondansetron (ZOFRAN) injection 4 mg  4 mg Intravenous Q6H PRN Evans Lance, MD      . polyethylene glycol (MIRALAX / GLYCOLAX) packet 17 g  17 g Oral Daily PRN Lelon Perla, MD   17 g at 09/17/18 2126  . sodium chloride flush (NS) 0.9 % injection 10-40 mL  10-40 mL Intracatheter PRN  Nita Sells, MD         Discharge Medications: Please see discharge summary for a list of discharge medications.  Relevant Imaging Results:  Relevant Lab Results:   Additional [place], [place], [place]  Vinie Sill, [place]

## 2018-09-18 NOTE — Clinical Social Work Note (Signed)
Clinical Social Work Assessment  Patient Details  Name: William [name] MRN: [id number] Date of Birth: [birth year]  Date of referral:  09/18/18               Reason for consult:  Facility Placement                Permission sought to share information with:  Family Supports, Customer service manager Permission granted to share information::  Yes, Verbal Permission Granted  Name::     Academic librarian::  SNFs  Relationship::  spouse  Contact Information:  [phone]  Housing/Transportation Living arrangements for the past 2 months:  Single Family Home Source of Information:  Spouse Patient Interpreter Needed:  None Criminal Activity/Legal Involvement Pertinent to Current Situation/Hospitalization:  No - Comment as needed Significant Relationships:  Spouse Lives with:  Spouse Do you feel safe going back to the place where you live?  No Need for family participation in patient care:  Yes (Comment)  Care giving concerns: CSW received consult for discharge needs. CSW spoke with patient's wife regarding PT recommendation of SNF placement at time of discharge. Patient's wife was upset the patient was  declined by inpatient rehab. Inpatient rehab, RNCM and CSW discussed with the patient and his wife regarding their options and why the patient was not appropriate for inpatient rehab. Patient's wife states she was unable to assist him in the home but she was considering hiring additional help if she decided to take him home. CSW provide the family with SNF listing form medicare.gov which included the nursing home compare rates.  Patient states she will review and inform the CSW of decision.    Social Worker assessment / plan:  CSW spoke with patient's wife concerning possibility of rehab at [facility] before returning home.   Employment status:  Disabled (Comment on whether or not currently receiving Disability) Insurance information:  Medicare PT Recommendations:  [place] / Referral to community resources:  [place]  Patient/Family's Response to care:  Patient recognizes need for rehab before returning home and is considering SNF verses home placement.   Patient/Family's Understanding of and Emotional Response to Diagnosis, Current Treatment, and Prognosis: Patient/family is realistic regarding therapy needs and expressed being hopeful for finding a quality SNF placement. Patient's wife states she was not "going to send him just anywhere" and she wanted to make that clear. Patient expressed understanding of CSW role and discharge process as well as medical condition. No questions/concerns about plan or treatment.    Emotional Assessment Appearance:  Appears stated age Attitude/Demeanor/Rapport:  (observant ) Affect (typically observed):  Accepting, Appropriate Orientation:  Oriented to Self, Oriented to Place, Oriented to  Time, Oriented to Situation Alcohol / Substance use:  Not Applicable Psych involvement (Current and /or in the community):  No (Comment)  Discharge Needs  Concerns to be addressed:  Care Coordination Readmission within the last 30 days:  Yes Current discharge risk:  Dependent with Mobility Barriers to Discharge:  Continued Medical Work up   [employer], [place] 09/18/2018, 6:52 PM

## 2018-09-18 NOTE — Progress Notes (Signed)
Inpatient Rehabilitation Admissions Coordinator  I met with patient, his wife, and caregiver at bedside. I explained that I do not have bed available to admit patient to inpt rehab today and he is ready for d/c. Wife asking questions of benefit of SNF rehab vs taking patient home with [facility] and hired caregiver support that she can afford. I have referred her decision to SNF vs HH to RN CM , Kristi. We will sign off at this time, for no bed available and pt discharged.  Danne Baxter, RN, MSN Rehab Admissions Coordinator [phone] 09/18/2018 11:10 AM

## 2018-09-18 NOTE — Discharge Summary (Signed)
Physician Discharge Summary  William [name] IRJ:[id number] DOB: 11-29-1927 DOA: 09/04/2018  PCP: Haywood Pao, MD  Admit date: 09/04/2018 Discharge date: 09/18/2018  Time spent: 35 minutes  Recommendations for Outpatient Follow-up:  1. Patient will need 6 weeks of antibiotics of meropenem and ending 10/27/2018 2. Recommend titration of metoprolol ER if needed discharged on lower dose of 25 mg daily 3. Recommend Chem-12 and CBC 1 week 4. Needs outpatient follow-up for infrarenal fusiform AAA as an outpatient   Discharge Diagnoses:  Principal Problem:   Sepsis [facility]) Active Problems:   Nonischemic cardiomyopathy ([place])   Hyperlipidemia   S/P ICD (internal cardiac defibrillator) procedure, 01/11/14 removal of ERI gen and placement of Medtronic Evera XT VR & NEW Right ventricular lead Medtronic   Ventricular tachycardia ([place])   S/P TAVR (transcatheter aortic valve replacement)   Bacteremia due to Klebsiella pneumoniae   Acute bacterial endocarditis   Infected defibrillator [facility])   Discharge Condition: fair  Diet recommendation:  hh low salt  Filed Weights   09/16/18 0644 09/17/18 0500 09/18/18 0400  Weight: 96.2 kg 97.4 kg 97 kg    History of present illness:  84 year old male History of chronic systolic heart failure EF 35-40%, severe AOS status post TAVR 08/18/2018 Tachycardia status post AICD AAA Prostate cancer Recurrent UTIs Degenerative arthritis Recent admission 10/25-07/3018 septic shock 2/2 Klebsiella UTI-readmitted 08/18/2018-08/20/2018 for TAVR Admit 09/04/2018 sudden shaking chills fatigue toxic metabolic encephalopathy Initial vitals consistent with possible sepsis pulse 120 BP 106/61 T-max 101.4 Lactic acid 3.6-portable CXR possible pneumonia    Hospital Course:  ESBL bacteremia Klebsiella pacemaker lead vegetation and endocarditis-TEE 1126 concerning for vegetation-ID recommended 6 weeks meropenem-underwent device extraction 09/15/2018--Needs 6  weeks of antibiotics ending on 10/27/2018--PICC line placed 12/5 and will need to be removed post completion of antibiotics Tachycardia with AICD placement-slightly tachycardic on discharge so reimplemented metoprolol ER 25 mg and will need to be titrated as an outpatient Severe aortic stenosis status post TAVR--resume aspirin 81 mg, can continue Plavix 75 daily in addition Sepsis physiology with hypotension resolved- amiodarone 100 daily Chronic systolic heart failure nonischemic cardiomyopathy-euvolemic and no need for diuretics at this time AAA-CT scan this admission 4.4 infrarenal fusiform AAA Likely anemia chronic disease-hemoglobin stable at this time Hyperlipidemia-continue Vytorin 1 at bedtime  Consultants:  Cardiothoracic surgery: Dr. Alvan Dame 09/04/2018  Infectious disease: Dr. Johnnye Sima 09/05/2018  Cardiology/valve team Dr. Angelena Form 09/08/2018  Cardiology: Dr. Debara Pickett 09/06/2018  Electrophysiology: Dr. Lovena Le 09/09/2018  Procedures:  CT abdomen and pelvis 09/04/2018  Chest x-ray 09/04/2018  2D echo 09/04/2018  Transesophageal echocardiogram 09/08/2018--- severe global reduction in LV systolic function: Status post AVR with trace periprosthetic AI, moderate MR,severe LAE, oscillating density on pacer wire concerning for vegetation.--- Per Dr. Stanford Breed  Antimicrobials:   IV cefepime 09/04/2018>>>> 09/05/2018  IV Rocephin 09/05/2018>>>>> 09/07/2018  IV Merrem 09/07/2018  IV vancomycin 09/04/2018>>>>> 09/05/2018 Discharge Exam: Vitals:   09/17/18 1955 09/18/18 0400  BP: 104/61 (!) 109/56  Pulse:    Resp:  20  Temp: 98.6 F (37 C) 98 F (36.7 C)  SpO2: 98% 97%    General: Awake alert pleasant no distress Cardiovascular: S1-S2 no murmur rub or gallop, tachycardic 100 range Respiratory: Clinically clear no added sound Thick neck Right hand has a brace on it and mild bruising Lower extremities are soft nontender  Discharge Instructions   Discharge  Instructions    Diet - low sodium heart healthy   Complete by:  As directed    Home infusion instructions [facility] May follow [place] Dosing Protocol; May administer Cathflo as needed to maintain patency of vascular access device.; Flushing of vascular access device: per [facility] Protocol: 0.9% NaCl pre/post medica...   Complete by:  As directed    Instructions:  May follow [place] Dosing Protocol   Instructions:  May administer Cathflo as needed to maintain patency of vascular access device.   Instructions:  Flushing of vascular access device: per [facility] Protocol: 0.9% NaCl pre/post medication administration and prn patency; Heparin 100 u/ml, 31m for implanted ports and Heparin 10u/ml, 517mfor all other central venous catheters.   Instructions:  May follow [facility] Anaphylaxis Protocol for First Dose Administration in the home: 0.9% NaCl at 25-50 ml/hr to maintain IV access for protocol meds. Epinephrine 0.3 ml IV/IM PRN and Benadryl 25-50 IV/IM PRN s/s of anaphylaxis.   Instructions:  Ad[facility]nfusion Coordinator (RN) to assist per patient IV care needs in the home PRN.   Increase activity slowly   Complete by:  As directed      Allergies as of 09/18/2018      Reactions   Crestor [rosuvastatin] Other (See Comments)   Hurts muscles   Lipitor [atorvastatin] Other (See Comments)   Hurts stomach      Medication List    STOP taking these medications   amoxicillin 500 MG capsule Commonly known as:  AMOXIL   furosemide 20 MG tablet Commonly known as:  LASIX   mineral oil-hydrophilic petrolatum ointment   potassium chloride 10 MEQ tablet Commonly known as:  K-DUR   triamcinolone 0.1 % cream : eucerin Crea     TAKE these medications   acetaminophen 500 MG tablet Commonly known as:  TYLENOL Take 500 mg by mouth every 6 (six) hours as needed (for pain).   amiodarone 200 MG tablet Commonly known as:  PACERONE TAKE 1/2 TABLET BY MOUTH EVERY DAY   aspirin 81 MG  chewable tablet Chew 1 tablet (81 mg total) by mouth daily.   cholecalciferol 1000 units tablet Commonly known as:  VITAMIN D Take 2,000 Units by mouth at bedtime.   clopidogrel 75 MG tablet Commonly known as:  PLAVIX Take 1 tablet (75 mg total) by mouth daily with breakfast.   ertapenem  IVPB Commonly known as:  INVANZ Inject 1 g into the vein daily. Indication:  ESBL Klebsiella endocarditis  Last Day of Therapy:  10/27/18 Labs - Once weekly:  CBC/D and BMP, Labs - Every other week:  ESR and CRP   ezetimibe-simvastatin 10-20 MG tablet Commonly known as:  VYTORIN Take 1 tablet by mouth at bedtime.   metoprolol succinate 25 MG 24 hr tablet Commonly known as:  TOPROL-XL Take 1 tablet (25 mg total) by mouth daily. Take with or immediately following a meal. What changed:    medication strength  how much to take   polyethylene glycol packet Commonly known as:  MIRALAX / GLYCOLAX Take 17 g by mouth every evening. What changed:    how much to take  when to take this  additional instructions   PRESERVISION AREDS 2 PO Take 1 capsule by mouth daily.   senna 8.6 MG Tabs tablet Commonly known as:  SENOKOT Take 1 tablet (8.6 mg total) by mouth daily.            Home Infusion Instuctions  (From admission, onward)         Start     Ordered   09/18/18 0000  Home infusion instructions [facility] May follow [place] Dosing Protocol; May administer Cathflo as needed to maintain patency of vascular access device.; Flushing of vascular access device: per [facility] Protocol: 0.9% NaCl pre/post medica...    Question Answer Comment  Instructions May follow [place] Dosing Protocol   Instructions May administer Cathflo as needed to maintain patency of vascular access device.   Instructions Flushing of vascular access device: per [facility] Protocol: 0.9% NaCl pre/post medication administration and prn patency; Heparin 100 u/ml, 35m for implanted ports and Heparin 10u/ml, 547m for all other central venous catheters.   Instructions May follow [facility] Anaphylaxis Protocol for First Dose Administration in the home: 0.9% NaCl at 25-50 ml/hr to maintain IV access for protocol meds. Epinephrine 0.3 ml IV/IM PRN and Benadryl 25-50 IV/IM PRN s/s of anaphylaxis.   Instructions [facility] Infusion Coordinator (RN) to assist per patient IV care needs in the home PRN.      09/18/18 1019         Allergies  Allergen Reactions  . Crestor [Rosuvastatin] Other (See Comments)    Hurts muscles  . Lipitor [Atorvastatin] Other (See Comments)    Hurts stomach   Follow-up Information    [facility] Follow up.   Specialty:  Cardiology Why:  09/23/18 @ 3:30PM, wound check visit Contact information: [phone]     CrSanda KleinMD Follow up.   Specialty:  Cardiology Why:  10/13/18 @ 10:40AM Contact information: [address] [phone]          The results of significant diagnostics from this hospitalization (including imaging, microbiology, ancillary and laboratory) are listed below for reference.    Significant Diagnostic Studies: Dg Chest 2 View  Result Date: 09/16/2018 CLINICAL DATA:  ICD infection. EXAM: CHEST - 2 VIEW COMPARISON:  09/15/2018 FINDINGS: Cardiac enlargement without heart failure. TAVR unchanged in position. Negative for heart failure or edema. Atherosclerotic aortic arch. Mild bibasilar atelectasis unchanged. IMPRESSION: Mild bibasilar atelectasis unchanged.  Negative for edema. Electronically Signed   By: ChFranchot Gallo.D.   On: 09/16/2018 08:57   Ct Abdomen Pelvis W Contrast  Result Date: 09/04/2018 CLINICAL DATA:  Abdominal pain and fever EXAM: CT ABDOMEN AND PELVIS WITH CONTRAST TECHNIQUE: Multidetector CT imaging of the abdomen and pelvis was performed using the standard protocol following bolus  administration of intravenous contrast. CONTRAST:  10058mMNIPAQUE IOHEXOL 300 MG/ML  SOLN COMPARISON:  08/03/2018 FINDINGS: Lower chest: Cardiomegaly with evidence of prior TAVR. Coronary arteriosclerosis is noted. Bibasilar dependent atelectasis. Hepatobiliary: Numerous gallstones are identified within the gallbladder without secondary signs of cholecystitis. No mural thickening or pericholecystic fluid is noted. No space-occupying mass of the liver nor biliary dilatation is seen. Pancreas: Atrophic pancreas without inflammation or ductal dilatation. Tiny subcentimeter cystic foci noted of the body of the pancreas, stable in appearance dating back to 2016 consistent benign findings. Spleen: Normal Adrenals/Urinary Tract: Normal bilateral adrenal glands. Bilateral renal cysts, the largest on the right is interpolar and posterior with punctate calcification associated with it. It measures up to 4.3 cm in diameter. The left kidney demonstrates a 4.5 cm in diameter upper pole renal cyst with smaller interpolar cysts noted. No hydroureteronephrosis. The urinary bladder is decompressed in appearance. Stomach/Bowel: The stomach is decompressed in appearance. The duodenal sweep and ligament of Treitz are normal. No small bowel obstruction, mass or inflammation. Normal appearing appendix. Nonobstructed  large bowel without inflammatory change. Scattered colonic diverticulosis without acute diverticulitis is identified along the descending sigmoid colon. Vascular/Lymphatic: 4.4 cm infrarenal fusiform abdominal aortic aneurysm with moderate aortoiliac and branch vessel atherosclerosis. Findings are unchanged from recent comparison. No lymphadenopathy by CT size criteria. Reproductive: Normal size prostate and seminal vesicles. Nonspecific metallic density projects between the rectum posterior wall of bladder. Other: No free air or free fluid. Musculoskeletal: Vertebral plana with marked height loss of L1, chronic in  appearance. IMPRESSION: 1. 4.4 cm infrarenal fusiform abdominal aortic aneurysm. Recommend followup by ultrasound in 1 year. This recommendation follows ACR consensus guidelines: White Paper of the ACR Incidental Findings Committee II on Vascular Findings. J Am Coll Radiol 2013; 10:789-794. 2. Bilateral simple and complex renal cysts. 3. Uncomplicated cholelithiasis. 4. Colonic diverticulosis without acute diverticulitis. 5. 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 Electronically Signed   By: Ashley Royalty M.D.   On: 09/04/2018 17:56   Dg Chest Port 1 View  Result Date: 09/15/2018 CLINICAL DATA:  ICD lead removal.  Postop infection. EXAM: PORTABLE CHEST 1 VIEW COMPARISON:  09/04/2018 FINDINGS: Interval removal of left AICD. Prior aortic valve repair. No pneumothorax. Cardiomegaly. Bibasilar atelectasis. No visible effusions or overt edema. IMPRESSION: Interval removal of left AICD.  No pneumothorax. Cardiomegaly, bibasilar atelectasis. Electronically Signed   By: Rolm Baptise M.D.   On: 09/15/2018 21:05   Dg Chest Port 1 View  Result Date: 09/04/2018 CLINICAL DATA:  Fever and altered mental status. EXAM: PORTABLE CHEST 1 VIEW COMPARISON:  08/18/2018 FINDINGS: Cardiomegaly and AICD again noted. This is a low volume film. RIGHT basilar opacity identified-favor atelectasis over airspace disease/pneumonia. No pleural effusion or pneumothorax. No acute bony abnormality. IMPRESSION: RIGHT basilar opacity-favor atelectasis over airspace disease/pneumonia, but correlate clinically. Cardiomegaly. Electronically Signed   By: Margarette Canada M.D.   On: 09/04/2018 14:59   Vas [place] Lower Extremity Venous (dvt)  Result Date: 08/28/2018  Lower Venous Study Other Indications: Patient complains of right foot pain for the past tow days.                    He denies any shortness of breath. Risk Factors: None identified. Performing Technologist: Wilkie Aye RVT  Examination Guidelines: A complete  evaluation includes B-mode imaging, spectral Doppler, color Doppler, and power Doppler as needed of all accessible portions of each vessel. Bilateral testing is considered an integral part of a complete examination. Limited examinations for reoccurring indications may be performed as noted.  Right Venous Findings: +---------+---------------+---------+-----------+----------+-------+          CompressibilityPhasicitySpontaneityPropertiesSummary +---------+---------------+---------+-----------+----------+-------+ CFV      Full           Yes      Yes                          +---------+---------------+---------+-----------+----------+-------+ SFJ      Full           Yes      Yes                          +---------+---------------+---------+-----------+----------+-------+ FV Prox  Full           Yes      Yes                          +---------+---------------+---------+-----------+----------+-------+ FV Mid   Full  Yes      Yes                          +---------+---------------+---------+-----------+----------+-------+ FV DistalFull           Yes      Yes                          +---------+---------------+---------+-----------+----------+-------+ PFV      Full                                                 +---------+---------------+---------+-----------+----------+-------+ POP      Full           Yes      Yes                          +---------+---------------+---------+-----------+----------+-------+ PTV      Full           Yes      Yes                          +---------+---------------+---------+-----------+----------+-------+ PERO     Full           Yes      Yes                          +---------+---------------+---------+-----------+----------+-------+ Gastroc  Full                                                 +---------+---------------+---------+-----------+----------+-------+ GSV      Full           Yes      Yes                           +---------+---------------+---------+-----------+----------+-------+   Summary: Right: No evidence of deep vein thrombosis in the lower extremity. No indirect evidence of obstruction proximal to the inguinal ligament. Interstitial fluid in the calf.  Left: No evidence of common femoral vein obstruction.  *See table(s) above for measurements and observations. Electronically signed by Ena Dawley MD on 08/28/2018 at 2:37:17 PM.    Final    [place] Ekg Site Rite  Result Date: 09/16/2018 If Site Rite image not attached, placement could not be confirmed due to current cardiac rhythm.  US Abdomen Limited Ruq  Result Date: 09/04/2018 CLINICAL DATA:  RIGHT upper quadrant pain. EXAM: ULTRASOUND ABDOMEN LIMITED RIGHT UPPER QUADRANT COMPARISON:  CT abdomen and pelvis September 04, 2018 FINDINGS: Gallbladder: Numerous echogenic stones within the gallbladder measuring less than 1 cm without gallbladder wall thickening or pericholecystic fluid. Wall echo shadow sign. No sonographic Murphy sign elicited. Common bile duct: Diameter: 8 mm, normal for age. Liver: No focal lesion identified. Within normal limits in parenchymal echogenicity. Portal vein is patent on color Doppler imaging with normal direction of blood flow towards the liver. IMPRESSION: 1. Cholelithiasis without sonographic findings of acute cholecystitis. Electronically Signed   By: Elon Alas M.D.   On: 09/04/2018 21:25    Microbiology: Recent  Results (from the past 240 hour(s))  Surgical PCR screen     Status: None   Collection Time: 09/14/18  8:10 PM  Result Value Ref Range Status   MRSA, PCR NEGATIVE NEGATIVE Final   Staphylococcus aureus NEGATIVE NEGATIVE Final    Comment: (NOTE) The Xpert SA Assay (FDA approved for NASAL specimens in patients 7 years of age and older), is one component of a comprehensive surveillance program. It is not intended to diagnose infection nor to guide or monitor treatment. Performed  at [facility], [place] [address], [place], [place] [address]   Aerobic/Anaerobic Culture (surgical/deep wound)     Status: None (Preliminary result)   Collection Time: 09/15/18  6:36 PM  Result Value Ref Range Status   Specimen Description WOUND PACEMAKER WOUND  Final   Special Requests KNOWN KLEBSIELLA CARRIER  Final   Gram Stain NO WBC SEEN NO ORGANISMS SEEN   Final   Culture   Final    NO GROWTH 3 DAYS NO ANAEROBES ISOLATED; CULTURE IN PROGRESS FOR 5 DAYS Performed at [facility], [place] [address], [place], [place] [address]    Report Status PENDING  Incomplete     Labs: Basic Metabolic Panel: Recent Labs  Lab 09/14/18 0308 09/15/18 0343 09/16/18 0515 09/17/18 0332 09/18/18 0850  NA 132* 133* 134* 132* 135  K 3.7 3.7 4.1 3.6 3.5  CL 100 101 102 99 102  CO2 '24 24 24 24 24  ' GLUCOSE 120* 155* 122* 121* 171*  BUN '20 17 14 16 14  ' CREATININE 0.91 1.01 0.87 0.77 0.88  CALCIUM 8.3* 8.1* 8.0* 7.7* 7.8*  MG  --  2.4  --   --   --    Liver Function Tests: No results for input(s): AST, ALT, ALKPHOS, BILITOT, PROT, ALBUMIN in the last 168 hours. No results for input(s): LIPASE, AMYLASE in the last 168 hours. No results for input(s): AMMONIA in the last 168 hours. CBC: Recent Labs  Lab 09/14/18 0308 09/15/18 0343 09/16/18 0515 09/17/18 0332 09/18/18 0850  WBC 4.8 4.3 6.2 6.1 6.3  NEUTROABS 2.7  --  4.6 4.0 4.6  HGB 11.3* 10.8* 10.8* 9.8* 9.9*  HCT 33.0* 31.3* 31.0* 28.1* 29.0*  MCV 95.4 95.7 96.6 94.9 96.3  PLT 120* 125* 121* 131* 129*   Cardiac Enzymes: No results for input(s): CKTOTAL, CKMB, CKMBINDEX, TROPONINI in the last 168 hours. BNP: BNP (last 3 results) Recent Labs    07/02/18 1819 08/13/18 0409  BNP 696.8* 459.6*    ProBNP (last 3 results) No results for input(s): PROBNP in the last 8760 hours.  CBG: No results for input(s): GLUCAP in the last 168 hours.     Signed:  Nita Sells MD   [facility] 09/18/2018, 10:19  AM

## 2018-09-18 NOTE — Progress Notes (Signed)
CSW was informed by Butch Penny / [facility] [place] [phone] for [facility]- the family want the patient to receive rehab there.CSW confirmed with the family. Butch Penny expressed, they did not have the availability and the earliest they could accept the patient was on Monday 09/21/2018.   CSW sent FL-2 but no PSARR was required since the patient is paying out of pocket.   CSW updated MD, CSW/ Surveyor, quantity, RNCM and RN.    Thurmond Butts, [place] Social Worker [phone]

## 2018-09-19 MED ORDER — ENOXAPARIN SODIUM 40 MG/0.4ML ~~LOC~~ SOLN
40.0000 mg | SUBCUTANEOUS | Status: DC
  Administered 2018-09-19 – 2018-09-20 (×2): 40 mg via SUBCUTANEOUS
  Filled 2018-09-19 (×2): qty 0.4

## 2018-09-19 MED ORDER — DIPHENHYDRAMINE HCL 25 MG PO CAPS
25.0000 mg | ORAL_CAPSULE | Freq: Three times a day (TID) | ORAL | Status: DC | PRN
  Administered 2018-09-19: 25 mg via ORAL
  Filled 2018-09-19: qty 1

## 2018-09-19 NOTE — Progress Notes (Signed)
[facility] PROGRESS NOTE  William [name] VZD:[id number] DOB: 26-Feb-1928 DOA: 09/04/2018 PCP: Haywood Pao, MD   Narrative: 84 year old male History of chronic systolic heart failure EF 35-40%, severe AOS status post TAVR 08/18/2018 Tachycardia status post AICD AAA Prostate cancer Recurrent UTIs Degenerative arthritis Recent admission 10/25-07/3018 septic shock 2/2 Klebsiella UTI-readmitted 08/18/2018-08/20/2018 for TAVR Admit 09/04/2018 sudden shaking chills fatigue toxic metabolic encephalopathy Initial vitals consistent with possible sepsis pulse 120 BP 106/61 T-max 101.4 Lactic acid 3.6-portable CXR possible pneumonia    A & Plan ESBL bacteremia Klebsiella pacemaker lead vegetation and endocarditis-TEE 1126 concerning for vegetation-ID recommended 6 weeks meropenem-underwent device extraction 09/15/2018--Needs 6 weeks of antibiotics ending on 10/27/2018--overall the plan is changed and the patient needs to go to a skilled facility on [place] line in place needs to be removed on completion of antibiotics Itching overnight 12/7-no overt rash-started on Benadryl nightly which is reasonable for both sleep and itching Severe aortic stenosis status post TAVR--resume aspirin 81 mg, continue Plavix 75 Sepsis physiology with hypotension resolved-only on amiodarone 100 daily Chronic systolic heart failure nonischemic cardiomyopathy-not on beta-blocker at this time AAA-CT scan this admission 4.4 infrarenal fusiform AAA needs follow-up as an outpatient Likely anemia chronic disease-hemoglobin stable at this time Hyperlipidemia-continue Vytorin 1 at bedtime  Lovenox Inpatient Awaiting therapy evaluation prior to discharge-no family present  Verlon Au, MD  [facility] Direct contact: [phone] --Via amion app OR  --[email or web address]; password [email or web address]  7PM-7AM contact night coverage as above 09/19/2018, 8:16 AM  LOS: 15 days   Consultants:   Cardiothoracic surgery:  Dr. Alvan Dame 09/04/2018  Infectious disease: Dr. Johnnye Sima 09/05/2018  Cardiology/valve team Dr. Angelena Form 09/08/2018  Cardiology: Dr. Debara Pickett 09/06/2018  Electrophysiology: Dr. Lovena Le 09/09/2018  Procedures:   CT abdomen and pelvis 09/04/2018  Chest x-ray 09/04/2018  2D echo 09/04/2018  Transesophageal echocardiogram 09/08/2018--- severe global reduction in LV systolic function: Status post AVR with trace periprosthetic AI, moderate MR,severe LAE, oscillating density on pacer wire concerning for vegetation.--- Per Dr. Stanford Breed  Antimicrobials:   IV cefepime 09/04/2018>>>> 09/05/2018  IV Rocephin 09/05/2018>>>>> 09/07/2018  IV Merrem 09/07/2018  IV vancomycin 09/04/2018>>>>> 09/05/2018  Interval history/Subjective:  Quite sleepy no new distress received Benadryl for itching Sitters states no other concerns  Objective:  Vitals:  Vitals:   09/19/18 0407 09/19/18 0423  BP: (!) 97/55   Pulse:    Resp: 18 (!) 21  Temp: 97.9 F (36.6 C)   SpO2: 94%     Exam:  . Sleepy but arouses . S1-S2 no murmur rub or gallop . Chest is clear . Overall is sleepy so I did not disturb or awaken   I have personally reviewed the following:   Labs: Marland Kitchen No new labs today  Imaging studies:    Medical tests:  n   Test discussed with performing physician:  n  Decision to obtain old records:  n  Review and summation of old records:  n  Scheduled Meds: . amiodarone  100 mg Oral Daily  . aspirin  81 mg Oral Daily  . clopidogrel  75 mg Oral Daily  . ezetimibe-simvastatin  1 tablet Oral QHS   Continuous Infusions: . meropenem (MERREM) IV 1 g (09/19/18 0501)    Principal Problem:   Sepsis ([place]) Active Problems:   Nonischemic cardiomyopathy (HCC)   Hyperlipidemia   S/P ICD (internal cardiac defibrillator) procedure, 01/11/14 removal of ERI gen and placement of Medtronic Evera XT VR & NEW Right ventricular lead Medtronic   Ventricular tachycardia ([place])  S/P TAVR  (transcatheter aortic valve replacement)   Bacteremia due to Klebsiella pneumoniae   Acute bacterial endocarditis   Infected defibrillator (HCC)   LOS: 15 days

## 2018-09-19 NOTE — Progress Notes (Signed)
Pharmacy Antibiotic Note  William [name] is a 84 y.o. 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 Now s/p ICD extraction on 12/3 with OPAT in for ertapenem, and plans to continue Merrem if staying for rehab.   SCr stable, wbc wnl, afebrile.  Plan: Continue Merrem 1g IV every 8 hours F/u Cx, renal function, ID recs and discharge plans   Height: 5\' 10"  (177.8 cm) Weight: 216 lb 7.9 oz (98.2 kg) IBW/kg (Calculated) : 73  Temp (24hrs), Avg:98 F (36.7 C), Min:97.9 F (36.6 C), Max:98.1 F (36.7 C)  Recent Labs  Lab 09/14/18 0308 09/15/18 0343 09/16/18 0515 09/17/18 0332 09/18/18 0850  WBC 4.8 4.3 6.2 6.1 6.3  CREATININE 0.91 1.01 0.87 0.77 0.88    Estimated Creatinine Clearance: 68.2 mL/min (by C-G formula based on SCr of 0.88 mg/dL).    Allergies  Allergen Reactions  . Crestor [Rosuvastatin] Other (See Comments)    Hurts muscles  . Lipitor [Atorvastatin] Other (See Comments)    Hurts stomach     Cefepime 11/22 x1 CTX 11/23 >> 11/25 Vanc 11/22 >> 11/23 Flagyl 11/22 >> 11/23 Merrem 11/25 >>  11/22 BCx - ESBL Kleb pneumo/ornithinolytica (BCID Kleb pneumo) - sen cefepime/imipenem for ornithinolytica; pneumo sen imipenem (ESBL +) 11/23 Flu A/B PCR - negative 11/24 BCx - Neg 12/3 ICD wound Cx: ngtd  Elicia Lamp, PharmD, BCPS Clinical Pharmacist Clinical phone [phone] Please check AMION for all [facility] contact numbers 09/19/2018 1:03 PM

## 2018-09-20 LAB — AEROBIC/ANAEROBIC CULTURE W GRAM STAIN (SURGICAL/DEEP WOUND)
Culture: NO GROWTH
Gram Stain: NONE SEEN

## 2018-09-20 MED ORDER — SODIUM CHLORIDE 0.9 % IV SOLN
1.0000 g | Freq: Three times a day (TID) | INTRAVENOUS | Status: DC
  Administered 2018-09-21 (×2): 1 g via INTRAVENOUS
  Filled 2018-09-20 (×3): qty 1

## 2018-09-20 NOTE — Progress Notes (Signed)
[facility] PROGRESS NOTE  William [name] [id number] DOB: 1928/02/28 DOA: 09/04/2018 PCP: Haywood Pao, MD   Narrative: 84 year old male History of chronic systolic heart failure EF 35-40%, severe AOS status post TAVR 08/18/2018 Tachycardia status post AICD AAA Prostate cancer Recurrent UTIs Degenerative arthritis Recent admission 10/25-07/3018 septic shock 2/2 Klebsiella UTI-readmitted 08/18/2018-08/20/2018 for TAVR Admit 09/04/2018 sudden shaking chills fatigue toxic metabolic encephalopathy Initial vitals consistent with possible sepsis pulse 120 BP 106/61 T-max 101.4 Lactic acid 3.6-portable CXR possible pneumonia  Eventually on work-up found to have bacteremia with ESBL and being treated for this going forward as below    A & Plan ESBL bacteremia Klebsiella pacemaker lead vegetation and endocarditis-TEE 1126 concerning for vegetation-ID recommended 6 weeks meropenem-underwent device extraction 09/15/2018-- 6 weeks Merrem ending on 10/27/2018-- needs to go to a skilled facility on [place] line in place needs to be removed on completion of antibiotics  Itching overnight 12/7-no overt rash-started on Benadryl but have held as slight somnolence  Severe aortic stenosis status post TAVR--resume aspirin 81 mg, continue Plavix 75  Sepsis physiology with hypotension resolved-only on amiodarone 100 daily  Chronic systolic heart failure nonischemic cardiomyopathy-not on beta-blocker at this time  AAA-CT scan this admission 4.4 infrarenal fusiform AAA needs follow-up as an outpatient  Likely anemia chronic disease-hemoglobin stable at this time Hyperlipidemia-continue Vytorin 1 at bedtime  Lovenox Inpatient Awaiting SNF discharge- has been stable for several days awaiting placement  Verlon Au, MD  [facility] Direct contact: [phone] --Via amion app OR  --[email or web address]; password [email or web address]  7PM-7AM contact night coverage as above 09/20/2018, 10:19 AM   LOS: 16 days   Consultants:   Cardiothoracic surgery: Dr. Alvan Dame 09/04/2018  Infectious disease: Dr. Johnnye Sima 09/05/2018  Cardiology/valve team Dr. Angelena Form 09/08/2018  Cardiology: Dr. Debara Pickett 09/06/2018  Electrophysiology: Dr. Lovena Le 09/09/2018  Procedures:   CT abdomen and pelvis 09/04/2018  Chest x-ray 09/04/2018  2D echo 09/04/2018  Transesophageal echocardiogram 09/08/2018--- severe global reduction in LV systolic function: Status post AVR with trace periprosthetic AI, moderate MR,severe LAE, oscillating density on pacer wire concerning for vegetation.--- Per Dr. Stanford Breed  Antimicrobials:   IV cefepime 09/04/2018>>>> 09/05/2018  IV Rocephin 09/05/2018>>>>> 09/07/2018  IV Merrem 09/07/2018  IV vancomycin 09/04/2018>>>>> 09/05/2018  Interval history/Subjective:  Still some sleepy Family bedside No fever no chills Eating well Had a stool  Objective:  Vitals:  Vitals:   09/20/18 0539 09/20/18 0638  BP: 104/70   Pulse: 94 90  Resp:  15  Temp: 98.2 F (36.8 C)   SpO2: 100% 100%    Exam:  eomi ncat in nad s1 s 2no m abd soft nt nd  Skin tear to R hand No le edema  I have personally reviewed the following:   Labs: Marland Kitchen No new labs today  Imaging studies:    Medical tests:  n   Test discussed with performing physician:  n  Decision to obtain old records:  n  Review and summation of old records:  n  Scheduled Meds: . amiodarone  100 mg Oral Daily  . aspirin  81 mg Oral Daily  . clopidogrel  75 mg Oral Daily  . enoxaparin (LOVENOX) injection  40 mg Subcutaneous Q24H  . ezetimibe-simvastatin  1 tablet Oral QHS   Continuous Infusions: . 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

## 2018-09-21 LAB — CBC WITH DIFFERENTIAL/PLATELET
Abs Immature Granulocytes: 0.04 10*3/uL (ref 0.00–0.07)
Basophils Absolute: 0 10*3/uL (ref 0.0–0.1)
Basophils Relative: 1 %
EOS ABS: 0.7 10*3/uL — AB (ref 0.0–0.5)
Eosinophils Relative: 14 %
HCT: 28.5 % — ABNORMAL LOW (ref 39.0–52.0)
Hemoglobin: 9.7 g/dL — ABNORMAL LOW (ref 13.0–17.0)
Immature Granulocytes: 1 %
Lymphocytes Relative: 21 %
Lymphs Abs: 1.1 10*3/uL (ref 0.7–4.0)
MCH: 33.1 pg (ref 26.0–34.0)
MCHC: 34 g/dL (ref 30.0–36.0)
MCV: 97.3 fL (ref 80.0–100.0)
Monocytes Absolute: 0.7 10*3/uL (ref 0.1–1.0)
Monocytes Relative: 12 %
Neutro Abs: 2.9 10*3/uL (ref 1.7–7.7)
Neutrophils Relative %: 51 %
Platelets: 144 10*3/uL — ABNORMAL LOW (ref 150–400)
RBC: 2.93 MIL/uL — ABNORMAL LOW (ref 4.22–5.81)
RDW: 15.2 % (ref 11.5–15.5)
WBC: 5.5 10*3/uL (ref 4.0–10.5)
nRBC: 0 % (ref 0.0–0.2)

## 2018-09-21 LAB — BASIC METABOLIC PANEL
ANION GAP: 9 (ref 5–15)
BUN: 19 mg/dL (ref 8–23)
CO2: 25 mmol/L (ref 22–32)
Calcium: 7.8 mg/dL — ABNORMAL LOW (ref 8.9–10.3)
Chloride: 103 mmol/L (ref 98–111)
Creatinine, Ser: 0.81 mg/dL (ref 0.61–1.24)
GFR calc non Af Amer: >60 mL/min (ref >60)
Glucose, Bld: 130 mg/dL — ABNORMAL HIGH (ref 70–99)
Potassium: 3.6 mmol/L (ref 3.5–5.1)
Sodium: 137 mmol/L (ref 135–145)

## 2018-09-21 MED ORDER — HEPARIN SOD (PORK) LOCK FLUSH 100 UNIT/ML IV SOLN
250.0000 [IU] | INTRAVENOUS | Status: AC | PRN
  Administered 2018-09-21: 13:00:00

## 2018-09-21 NOTE — Discharge Summary (Signed)
Physician Discharge Summary  William [name] WUJ:[id number] DOB: 07/06/1928 DOA: 09/04/2018  Patient has been stable for discharge since 12/6 and approp changes ot summary have been made  PCP: Haywood Pao, MD  Admit date: 09/04/2018 Discharge date: 09/21/2018  Time spent: 35 minutes  Recommendations for Outpatient Follow-up:  1. Patient will need 6 weeks of antibiotics of meropenem and ending 10/27/2018 2. Recommend titration of metoprolol ER if needed discharged on lower dose of 25 mg daily 3. Recommend Chem-12 and CBC 1 week 4. Needs outpatient follow-up for infrarenal fusiform AAA as an outpatient   Discharge Diagnoses:  Principal Problem:   Sepsis [facility]) Active Problems:   Nonischemic cardiomyopathy ([place])   Hyperlipidemia   S/P ICD (internal cardiac defibrillator) procedure, 01/11/14 removal of ERI gen and placement of Medtronic Evera XT VR & NEW Right ventricular lead Medtronic   Ventricular tachycardia ([place])   S/P TAVR (transcatheter aortic valve replacement)   Bacteremia due to Klebsiella pneumoniae   Acute bacterial endocarditis   Infected defibrillator [facility])   Discharge Condition: fair  Diet recommendation:  hh low salt  Filed Weights   09/19/18 0423 09/20/18 0638 09/21/18 0431  Weight: 98.2 kg 100.8 kg 97.8 kg    History of present illness:  84 year old male History of chronic systolic heart failure EF 35-40%, severe AOS status post TAVR 08/18/2018 Tachycardia status post AICD AAA Prostate cancer Recurrent UTIs Degenerative arthritis Recent admission 10/25-07/3018 septic shock 2/2 Klebsiella UTI-readmitted 08/18/2018-08/20/2018 for TAVR Admit 09/04/2018 sudden shaking chills fatigue toxic metabolic encephalopathy Initial vitals consistent with possible sepsis pulse 120 BP 106/61 T-max 101.4 Lactic acid 3.6-portable CXR possible pneumonia  Eventually on work-up found to have bacteremia with ESBL and being treated for this going forward as  below   Hospital Course:  ESBL bacteremia Klebsiella pacemaker lead vegetation and endocarditis-TEE 1126 concerning for vegetation-ID recommended 6 weeks meropenem-underwent device extraction 09/15/2018--Needs 6 weeks of antibiotics ending on 10/27/2018--PICC line placed 12/5 and will need to be removed post completion of antibiotics 10/27/18 Tachycardia with AICD placement-slightly tachycardic on discharge so reimplemented metoprolol ER 25 mg and will need to be titrated as an outpatient Itching-was place don benadryl for this but became a little sleepy on this-will need to be monitored Severe aortic stenosis status post TAVR--resume aspirin 81 mg, can continue Plavix 75  Sepsis physiology with hypotension resolved- amiodarone 100 daily Chronic systolic heart failure nonischemic cardiomyopathy-euvolemic and no need for diuretics at this time AAA-CT scan this admission 4.4 infrarenal fusiform AAA Likely anemia chronic disease-hemoglobin stable at this time Hyperlipidemia-continue Vytorin 1 at bedtime  Consultants:  Cardiothoracic surgery: Dr. Alvan Dame 09/04/2018  Infectious disease: Dr. Johnnye Sima 09/05/2018  Cardiology/valve team Dr. Angelena Form 09/08/2018  Cardiology: Dr. Debara Pickett 09/06/2018  Electrophysiology: Dr. Lovena Le 09/09/2018  Procedures:  CT abdomen and pelvis 09/04/2018  Chest x-ray 09/04/2018  2D echo 09/04/2018  Transesophageal echocardiogram 09/08/2018--- severe global reduction in LV systolic function: Status post AVR with trace periprosthetic AI, moderate MR,severe LAE, oscillating density on pacer wire concerning for vegetation.--- Per Dr. Stanford Breed  Antimicrobials:   IV cefepime 09/04/2018>>>> 09/05/2018  IV Rocephin 09/05/2018>>>>> 09/07/2018  IV Merrem 09/07/2018  IV vancomycin 09/04/2018>>>>> 09/05/2018 Discharge Exam: Vitals:   09/21/18 0322 09/21/18 0431  BP: 96/63   Pulse: 93 93  Resp: (!) 25 16  Temp: 97.6 F (36.4 C)   SpO2: 96% 97%    General:  Awake alert pleasant no distress Cardiovascular: S1-S2 no murmur rub or gallop, tachycardic 100 range Respiratory: Clinically clear no added  sound Thick neck Right hand has a brace on it and mild bruising Lower extremities are soft nontender  Discharge Instructions   Discharge Instructions    Diet - low sodium heart healthy   Complete by:  As directed    Home infusion instructions [facility] May follow [place] Dosing Protocol; May administer Cathflo as needed to maintain patency of vascular access device.; Flushing of vascular access device: per [facility] Protocol: 0.9% NaCl pre/post medica...   Complete by:  As directed    Instructions:  May follow [place] Dosing Protocol   Instructions:  May administer Cathflo as needed to maintain patency of vascular access device.   Instructions:  Flushing of vascular access device: per [facility] Protocol: 0.9% NaCl pre/post medication administration and prn patency; Heparin 100 u/ml, 36m for implanted ports and Heparin 10u/ml, 539mfor all other central venous catheters.   Instructions:  May follow [facility] Anaphylaxis Protocol for First Dose Administration in the home: 0.9% NaCl at 25-50 ml/hr to maintain IV access for protocol meds. Epinephrine 0.3 ml IV/IM PRN and Benadryl 25-50 IV/IM PRN s/s of anaphylaxis.   Instructions:  [facility] Coordinator (RN) to assist per patient IV care needs in the home PRN.   Increase activity slowly   Complete by:  As directed      Allergies as of 09/21/2018      Reactions   Crestor [rosuvastatin] Other (See Comments)   Hurts muscles   Lipitor [atorvastatin] Other (See Comments)   Hurts stomach      Medication List    STOP taking these medications   amoxicillin 500 MG capsule Commonly known as:  AMOXIL   furosemide 20 MG tablet Commonly known as:  LASIX   mineral oil-hydrophilic petrolatum ointment   potassium chloride 10 MEQ tablet Commonly known as:  K-DUR   triamcinolone 0.1 %  cream : eucerin Crea     TAKE these medications   acetaminophen 500 MG tablet Commonly known as:  TYLENOL Take 500 mg by mouth every 6 (six) hours as needed (for pain).   amiodarone 200 MG tablet Commonly known as:  PACERONE TAKE 1/2 TABLET BY MOUTH EVERY DAY   aspirin 81 MG chewable tablet Chew 1 tablet (81 mg total) by mouth daily.   cholecalciferol 1000 units tablet Commonly known as:  VITAMIN D Take 2,000 Units by mouth at bedtime.   clopidogrel 75 MG tablet Commonly known as:  PLAVIX Take 1 tablet (75 mg total) by mouth daily with breakfast.   ertapenem  IVPB Commonly known as:  INVANZ Inject 1 g into the vein daily. Indication:  ESBL Klebsiella endocarditis  Last Day of Therapy:  10/27/18 Labs - Once weekly:  CBC/D and BMP, Labs - Every other week:  ESR and CRP   ezetimibe-simvastatin 10-20 MG tablet Commonly known as:  VYTORIN Take 1 tablet by mouth at bedtime.   metoprolol succinate 25 MG 24 hr tablet Commonly known as:  TOPROL-XL Take 1 tablet (25 mg total) by mouth daily. Take with or immediately following a meal. What changed:    medication strength  how much to take   polyethylene glycol packet Commonly known as:  MIRALAX / GLYCOLAX Take 17 g by mouth every evening. What changed:    how much to take  when to take this  additional instructions   PRESERVISION AREDS 2 PO Take 1 capsule by mouth daily.   senna 8.6 MG Tabs tablet Commonly known as:  SENOKOT Take 1  tablet (8.6 mg total) by mouth daily.            Home Infusion Instuctions  (From admission, onward)         Start     Ordered   09/18/18 0000  Home infusion instructions [facility] May follow [place] Dosing Protocol; May administer Cathflo as needed to maintain patency of vascular access device.; Flushing of vascular access device: per [facility] Protocol: 0.9% NaCl pre/post medica...    Question Answer Comment  Instructions May follow [place] Dosing Protocol    Instructions May administer Cathflo as needed to maintain patency of vascular access device.   Instructions Flushing of vascular access device: per [facility] Protocol: 0.9% NaCl pre/post medication administration and prn patency; Heparin 100 u/ml, 61m for implanted ports and Heparin 10u/ml, 525mfor all other central venous catheters.   Instructions May follow [facility] Anaphylaxis Protocol for First Dose Administration in the home: 0.9% NaCl at 25-50 ml/hr to maintain IV access for protocol meds. Epinephrine 0.3 ml IV/IM PRN and Benadryl 25-50 IV/IM PRN s/s of anaphylaxis.   Instructions [facility] Infusion Coordinator (RN) to assist per patient IV care needs in the home PRN.      09/18/18 1019         Allergies  Allergen Reactions  . Crestor [Rosuvastatin] Other (See Comments)    Hurts muscles  . Lipitor [Atorvastatin] Other (See Comments)    Hurts stomach   Follow-up Information    [facility].   Specialty:  Cardiology Why:  09/23/18 @ 3:30PM, wound check visit Contact information: [address] [phone]     CrSanda KleinMD Follow up.   Specialty:  Cardiology Why:  10/13/18 @ 10:40AM Contact information: [address] [phone]          The results of significant diagnostics from this hospitalization (including imaging, microbiology, ancillary and laboratory) are listed below for reference.    Significant Diagnostic Studies: Dg Chest 2 View  Result Date: 09/16/2018 CLINICAL DATA:  ICD infection. EXAM: CHEST - 2 VIEW COMPARISON:  09/15/2018 FINDINGS: Cardiac enlargement without heart failure. TAVR unchanged in position. Negative for heart failure or edema. Atherosclerotic aortic arch. Mild bibasilar atelectasis unchanged. IMPRESSION: Mild bibasilar atelectasis unchanged.  Negative for edema. Electronically Signed   By: ChFranchot Gallo.D.    On: 09/16/2018 08:57   Ct Abdomen Pelvis W Contrast  Result Date: 09/04/2018 CLINICAL DATA:  Abdominal pain and fever EXAM: CT ABDOMEN AND PELVIS WITH CONTRAST TECHNIQUE: Multidetector CT imaging of the abdomen and pelvis was performed using the standard protocol following bolus administration of intravenous contrast. CONTRAST:  10028mMNIPAQUE IOHEXOL 300 MG/ML  SOLN COMPARISON:  08/03/2018 FINDINGS: Lower chest: Cardiomegaly with evidence of prior TAVR. Coronary arteriosclerosis is noted. Bibasilar dependent atelectasis. Hepatobiliary: Numerous gallstones are identified within the gallbladder without secondary signs of cholecystitis. No mural thickening or pericholecystic fluid is noted. No space-occupying mass of the liver nor biliary dilatation is seen. Pancreas: Atrophic pancreas without inflammation or ductal dilatation. Tiny subcentimeter cystic foci noted of the body of the pancreas, stable in appearance dating back to 2016 consistent benign findings. Spleen: Normal Adrenals/Urinary Tract: Normal bilateral adrenal glands. Bilateral renal cysts, the largest on the right is interpolar and posterior with punctate calcification associated with it. It measures up to 4.3 cm in diameter. The left kidney demonstrates a 4.5  cm in diameter upper pole renal cyst with smaller interpolar cysts noted. No hydroureteronephrosis. The urinary bladder is decompressed in appearance. Stomach/Bowel: The stomach is decompressed in appearance. The duodenal sweep and ligament of Treitz are normal. No small bowel obstruction, mass or inflammation. Normal appearing appendix. Nonobstructed large bowel without inflammatory change. Scattered colonic diverticulosis without acute diverticulitis is identified along the descending sigmoid colon. Vascular/Lymphatic: 4.4 cm infrarenal fusiform abdominal aortic aneurysm with moderate aortoiliac and branch vessel atherosclerosis. Findings are unchanged from recent comparison. No  lymphadenopathy by CT size criteria. Reproductive: Normal size prostate and seminal vesicles. Nonspecific metallic density projects between the rectum posterior wall of bladder. Other: No free air or free fluid. 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 IMPRESSION: 1. 4.4 cm infrarenal fusiform abdominal aortic aneurysm. Recommend followup by ultrasound in 1 year. This recommendation follows ACR consensus guidelines: White Paper of the ACR Incidental Findings Committee II on Vascular Findings. J Am Coll Radiol 2013; 10:789-794. 2. Bilateral simple and complex renal cysts. 3. Uncomplicated cholelithiasis. 4. Colonic diverticulosis without acute diverticulitis. 5. 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 Electronically Signed   By: Ashley Royalty M.D.   On: 09/04/2018 17:56   Dg Chest Port 1 View  Result Date: 09/15/2018 CLINICAL DATA:  ICD lead removal.  Postop infection. EXAM: PORTABLE CHEST 1 VIEW COMPARISON:  09/04/2018 FINDINGS: Interval removal of left AICD. Prior aortic valve repair. No pneumothorax. Cardiomegaly. Bibasilar atelectasis. No visible effusions or overt edema. IMPRESSION: Interval removal of left AICD.  No pneumothorax. Cardiomegaly, bibasilar atelectasis. Electronically Signed   By: Rolm Baptise M.D.   On: 09/15/2018 21:05   Dg Chest Port 1 View  Result Date: 09/04/2018 CLINICAL DATA:  Fever and altered mental status. EXAM: PORTABLE CHEST 1 VIEW COMPARISON:  08/18/2018 FINDINGS: Cardiomegaly and AICD again noted. This is a low volume film. RIGHT basilar opacity identified-favor atelectasis over airspace disease/pneumonia. No pleural effusion or pneumothorax. No acute bony abnormality. IMPRESSION: RIGHT basilar opacity-favor atelectasis over airspace disease/pneumonia, but correlate clinically. Cardiomegaly. Electronically Signed   By: Margarette Canada M.D.   On: 09/04/2018 14:59   Vas [place] Lower Extremity Venous  (dvt)  Result Date: 08/28/2018  Lower Venous Study Other Indications: Patient complains of right foot pain for the past tow days.                    He denies any shortness of breath. Risk Factors: None identified. Performing Technologist: Wilkie Aye RVT  Examination Guidelines: A complete evaluation includes B-mode imaging, spectral Doppler, color Doppler, and power Doppler as needed of all accessible portions of each vessel. Bilateral testing is considered an integral part of a complete examination. Limited examinations for reoccurring indications may be performed as noted.  Right Venous Findings: +---------+---------------+---------+-----------+----------+-------+          CompressibilityPhasicitySpontaneityPropertiesSummary +---------+---------------+---------+-----------+----------+-------+ CFV      Full           Yes      Yes                          +---------+---------------+---------+-----------+----------+-------+ SFJ      Full           Yes      Yes                          +---------+---------------+---------+-----------+----------+-------+ FV Prox  Full  Yes      Yes                          +---------+---------------+---------+-----------+----------+-------+ FV Mid   Full           Yes      Yes                          +---------+---------------+---------+-----------+----------+-------+ FV DistalFull           Yes      Yes                          +---------+---------------+---------+-----------+----------+-------+ PFV      Full                                                 +---------+---------------+---------+-----------+----------+-------+ POP      Full           Yes      Yes                          +---------+---------------+---------+-----------+----------+-------+ PTV      Full           Yes      Yes                          +---------+---------------+---------+-----------+----------+-------+ PERO     Full            Yes      Yes                          +---------+---------------+---------+-----------+----------+-------+ Gastroc  Full                                                 +---------+---------------+---------+-----------+----------+-------+ GSV      Full           Yes      Yes                          +---------+---------------+---------+-----------+----------+-------+   Summary: Right: No evidence of deep vein thrombosis in the lower extremity. No indirect evidence of obstruction proximal to the inguinal ligament. Interstitial fluid in the calf.  Left: No evidence of common femoral vein obstruction.  *See table(s) above for measurements and observations. Electronically signed by Ena Dawley MD on 08/28/2018 at 2:37:17 PM.    Final    [place] Ekg Site Rite  Result Date: 09/16/2018 If Site Rite image not attached, placement could not be confirmed due to current cardiac rhythm.  US Abdomen Limited Ruq  Result Date: 09/04/2018 CLINICAL DATA:  RIGHT upper quadrant pain. EXAM: ULTRASOUND ABDOMEN LIMITED RIGHT UPPER QUADRANT COMPARISON:  CT abdomen and pelvis September 04, 2018 FINDINGS: Gallbladder: Numerous echogenic stones within the gallbladder measuring less than 1 cm without gallbladder wall thickening or pericholecystic fluid. Wall echo shadow sign. No sonographic Murphy sign elicited. Common bile duct: Diameter: 8 mm, normal for age. Liver: No focal lesion identified. Within normal limits  in parenchymal echogenicity. Portal vein is patent on color Doppler imaging with normal direction of blood flow towards the liver. IMPRESSION: 1. Cholelithiasis without sonographic findings of acute cholecystitis. Electronically Signed   By: Elon Alas M.D.   On: 09/04/2018 21:25    Microbiology: Recent Results (from the past 240 hour(s))  Surgical PCR screen     Status: None   Collection Time: 09/14/18  8:10 PM  Result Value Ref Range Status   MRSA, PCR NEGATIVE NEGATIVE Final    Staphylococcus aureus NEGATIVE NEGATIVE Final    Comment: (NOTE) The Xpert SA Assay (FDA approved for NASAL specimens in patients 22 years of age and older), is one component of a comprehensive surveillance program. It is not intended to diagnose infection nor to guide or monitor treatment. Performed at [facility], [place] [address], [place], [place] [address]   Aerobic/Anaerobic Culture (surgical/deep wound)     Status: None   Collection Time: 09/15/18  6:36 PM  Result Value Ref Range Status   Specimen Description WOUND PACEMAKER WOUND  Final   Special Requests KNOWN KLEBSIELLA CARRIER  Final   Gram Stain NO WBC SEEN NO ORGANISMS SEEN   Final   Culture   Final    No growth aerobically or anaerobically. Performed at [facility], [place] [address] [place], [place] [address]    Report Status 09/20/2018 FINAL  Final     Labs: Basic Metabolic Panel: Recent Labs  Lab 09/15/18 0343 09/16/18 0515 09/17/18 0332 09/18/18 0850 09/21/18 0413  NA 133* 134* 132* 135 137  K 3.7 4.1 3.6 3.5 3.6  CL 101 102 99 102 103  CO2 '24 24 24 24 25  ' GLUCOSE 155* 122* 121* 171* 130*  BUN '17 14 16 14 19  ' CREATININE 1.01 0.87 0.77 0.88 0.81  CALCIUM 8.1* 8.0* 7.7* 7.8* 7.8*  MG 2.4  --   --   --   --    Liver Function Tests: No results for input(s): AST, ALT, ALKPHOS, BILITOT, PROT, ALBUMIN in the last 168 hours. No results for input(s): LIPASE, AMYLASE in the last 168 hours. No results for input(s): AMMONIA in the last 168 hours. CBC: Recent Labs  Lab 09/15/18 0343 09/16/18 0515 09/17/18 0332 09/18/18 0850 09/21/18 0413  WBC 4.3 6.2 6.1 6.3 5.5  NEUTROABS  --  4.6 4.0 4.6 2.9  HGB 10.8* 10.8* 9.8* 9.9* 9.7*  HCT 31.3* 31.0* 28.1* 29.0* 28.5*  MCV 95.7 96.6 94.9 96.3 97.3  PLT 125* 121* 131* 129* 144*   Cardiac Enzymes: No results for input(s): CKTOTAL, CKMB, CKMBINDEX, TROPONINI in the last 168 hours. BNP: BNP (last 3 results) Recent Labs    07/02/18 1819  08/13/18 0409  BNP 696.8* 459.6*    ProBNP (last 3 results) No results for input(s): PROBNP in the last 8760 hours.  CBG: No results for input(s): GLUCAP in the last 168 hours.     Signed:  Nita Sells MD   [facility] 09/21/2018, 9:06 AM

## 2018-09-21 NOTE — Progress Notes (Signed)
Occupational Therapy Treatment Patient Details Name: William [name] MRN: [id number] DOB: 12/30/1927 Today's Date: 09/21/2018    History of present illness Pt is a 84 y.o. M with significant PMH of chronic systolic EF, ICM, severe AS s/p TAVR 11/5, hx of ventricular tachycardia s/p ICD, AAA, hyperlipidemia, prostate CA with recurrent UTI, who presents with sudden onset of fever, chills, fatigue. Recent admits 10/25-10/31 for septic shock secondary to UTI. Admitted 11/5-11/7 for TAVR. s/p ICD extraction 12/3    OT comments  Pt making slow progress with functional goals with improvement since last session. Pt sat EOB x 7 minutes for grooming and in prep for sit - stand with RW. Pt required mod A x 2 for sit - stand for SPTs. OT will continue to follow acutely  Follow Up Recommendations  SNF;Supervision/Assistance - 24 hour    Equipment Recommendations  None recommended by OT    Recommendations for Other Services      Precautions / Restrictions Precautions Precautions: Fall Required Braces or Orthoses: Other Brace Other Brace: Right wrist splint from recent wrist dislocation per pt family; but can use walker with splint on per family Restrictions Weight Bearing Restrictions: No       Mobility Bed Mobility Overal bed mobility: Needs Assistance Bed Mobility: Sit to Supine;Supine to Sit     Supine to sit: Max assist Sit to supine: Max assist   General bed mobility comments: assist to elevate trunk and with LEs back onto bed  Transfers Overall transfer level: Needs assistance Equipment used: Rolling walker (2 wheeled) Transfers: Sit to/from Stand Sit to Stand: +2 physical assistance;Mod assist              Balance Overall balance assessment: Needs assistance   Sitting balance-Leahy Scale: Poor   Postural control: Posterior lean Standing balance support: Bilateral upper extremity supported Standing balance-Leahy Scale: Zero                              ADL either performed or assessed with clinical judgement   ADL Overall ADL's : Needs assistance/impaired     Grooming: Wash/dry face;Sitting;Wash/dry hands;Minimal assistance Grooming Details (indicate cue type and reason): min A for balance/support and to initiate task                 Toilet Transfer: Minimal assistance;+2 for physical assistance             General ADL Comments: Two trials of sit to stand from edge of bed. Mod A X 2 for powering up to stand from elevated bed surface      Vision Baseline Vision/History: Wears glasses Wears Glasses: At all times     Perception     Praxis      Cognition Arousal/Alertness: Awake/alert Behavior During Therapy: WFL for tasks assessed/performed Overall Cognitive Status: History of cognitive impairments - at baseline                                 General Comments: Baseline dementia.         Exercises     Shoulder Instructions       General Comments      Pertinent Vitals/ Pain       Pain Assessment: 0-10 Faces Pain Scale: Hurts a little bit Pain Location: generalized Pain Descriptors / Indicators: Grimacing;Moaning Pain Intervention(s): Monitored during session;Repositioned  Home Living  Prior Functioning/Environment              Frequency  Min 2X/week        Progress Toward Goals  OT Goals(current goals can now be found in the care plan section)  Progress towards OT goals: Progressing toward goals     Plan Discharge plan remains appropriate    Co-evaluation                 AM-PAC OT "6 Clicks" Daily Activity     Outcome Measure   Help from another person eating meals?: A Little Help from another person taking care of personal grooming?: A Little Help from another person toileting, which includes using toliet, bedpan, or urinal?: A Lot Help from another person bathing (including washing, rinsing,  drying)?: A Lot Help from another person to put on and taking off regular upper body clothing?: A Lot Help from another person to put on and taking off regular lower body clothing?: Total 6 Click Score: 13    End of Session Equipment Utilized During Treatment: Gait belt;Rolling walker  OT Visit Diagnosis: Unsteadiness on feet (R26.81);Other abnormalities of gait and mobility (R26.89);Muscle weakness (generalized) (M62.81);Other symptoms and signs involving cognitive function   Activity Tolerance Patient limited by fatigue   Patient Left in bed;with bed alarm set;Other (comment)(family caregiver)   Nurse Communication          Time: [phone] OT Time Calculation (min): 25 min  Charges: OT General Charges $OT Visit: 1 Visit OT Treatments $Therapeutic Activity: 23-37 mins     Britt Bottom 09/21/2018, 1:26 PM

## 2018-09-21 NOTE — Clinical Social Work Placement (Signed)
Nurse to call and report to [phone].   CLINICAL SOCIAL WORK PLACEMENT  NOTE  Date:  09/21/2018  Patient Details  Name: William [name] MRN: [id number] Date of Birth: July 25, 1928  Clinical Social Work is seeking post-discharge placement for this patient at the [place] level of care (*CSW will initial, date and re-position this form in  chart as items are completed):  Yes   Patient/family provided with [facility] Department's list of facilities offering this level of care within the geographic area requested by the patient (or if unable, by the patient's family).  Yes   Patient/family informed of their freedom to choose among providers that offer the needed level of care, that participate in Medicare, Medicaid or managed care program needed by the patient, have an available bed and are willing to accept the patient.  Yes   Patient/family informed of [facility]'s ownership interest in [facility] and [facility], as well as of the fact that they are under no obligation to receive care at these facilities.  PASRR submitted to EDS on       PASRR number received on 09/18/18     Existing PASRR number confirmed on       FL2 transmitted to all facilities in geographic area requested by pt/family on 09/18/18     FL2 transmitted to all facilities within larger geographic area on       Patient informed that his/her managed care company has contracts with or will negotiate with certain facilities, including the following:        Yes   Patient/family informed of bed offers received.  Patient chooses bed at [facility]     Physician recommends and patient chooses bed at      Patient to be transferred to [facility] on 09/21/18.  Patient to be transferred to facility by [facility]     Patient family notified on 09/21/18 of transfer.  Name of family member notified:  William [name],William [name]     PHYSICIAN       Additional Comment:     _______________________________________________ Gelene Mink, [place] 09/21/2018, 10:06 AM

## 2018-09-21 NOTE — Care Management Note (Signed)
Case Management Note   RN, BSN Transitions of Care Unit 4E- RN Case Manager [phone]  Patient Details  Name: William [name] MRN: [id number] Date of Birth: 08/14/1928  Subjective/Objective:   Pt admitted with sepsis ESBL bacteremia, infected ICD lead, s/p ICD extraction                  Action/Plan: PTA pt lived at home with spouse, was active with [facility] RN/PT and [facility]. Per ID pt will need 6 weeks of IV abx PICC line placed 12/5 will complete IV abx on 10/27/18. CIR consulted for possible admission post ICD extraction.   Expected Discharge Date:  09/21/18               Expected Discharge Plan:  Skilled Nursing Facility  In-House Referral:  Clinical Social Work  Discharge planning Services  CM Consult  Post Acute Care Choice:  Home Health Choice offered to:  Spouse  DME Arranged:    DME Agency:     HH Arranged:    HH Agency:  [facility]  Status of Service:  Completed, signed off  If discussed at Long Length of Stay Meetings, dates discussed:    Discharge Disposition: skilled facility   Additional Comments:  09/21/18- 1000-   RN, CM- pt for discharge today to [facility]- CSW following for placement needs.   09/18/18- 1130-   RN, CM- pt stable for transition to rehab today- however CIR does not have bed available today, pt remains 2+ assist, CSW and CM met with wife at the bedside to discuss transition options STSNF vs return home with HH- per wife she is concerned about taking pt home with current level of care needs and home IV agreeable to look at STSNF, states daughter is also calling a few places, CSW to provide list and start bed search. CM also provided CMS list of HH agencies although wife states she will return home with [facility] when pt stronger and able to return home.  1600- update- per CSW bed has been found at [facility] but unavailable until Monday 12/9- plan will be to d/c pt  at that time.   ,  Hall, RN 09/21/2018, 11:29 AM  

## 2018-09-22 ENCOUNTER — Encounter: Payer: Self-pay | Admitting: Internal Medicine

## 2018-09-22 ENCOUNTER — Non-Acute Institutional Stay (SKILLED_NURSING_FACILITY): Payer: Medicare Other | Admitting: Internal Medicine

## 2018-09-22 ENCOUNTER — Telehealth: Payer: Self-pay | Admitting: Cardiology

## 2018-09-22 DIAGNOSIS — Z952 Presence of prosthetic heart valve: Secondary | ICD-10-CM

## 2018-09-22 DIAGNOSIS — I714 Abdominal aortic aneurysm, without rupture, unspecified: Secondary | ICD-10-CM

## 2018-09-22 DIAGNOSIS — I2510 Atherosclerotic heart disease of native coronary artery without angina pectoris: Secondary | ICD-10-CM | POA: Diagnosis not present

## 2018-09-22 DIAGNOSIS — S52601A Unspecified fracture of lower end of right ulna, initial encounter for closed fracture: Secondary | ICD-10-CM | POA: Insufficient documentation

## 2018-09-22 DIAGNOSIS — F0150 Vascular dementia without behavioral disturbance: Secondary | ICD-10-CM

## 2018-09-22 DIAGNOSIS — I5022 Chronic systolic (congestive) heart failure: Secondary | ICD-10-CM

## 2018-09-22 DIAGNOSIS — A414 Sepsis due to anaerobes: Secondary | ICD-10-CM

## 2018-09-22 DIAGNOSIS — C61 Malignant neoplasm of prostate: Secondary | ICD-10-CM

## 2018-09-22 DIAGNOSIS — I330 Acute and subacute infective endocarditis: Secondary | ICD-10-CM

## 2018-09-22 DIAGNOSIS — E785 Hyperlipidemia, unspecified: Secondary | ICD-10-CM | POA: Diagnosis not present

## 2018-09-22 DIAGNOSIS — R531 Weakness: Secondary | ICD-10-CM | POA: Insufficient documentation

## 2018-09-22 DIAGNOSIS — T827XXA Infection and inflammatory reaction due to other cardiac and vascular devices, implants and grafts, initial encounter: Secondary | ICD-10-CM | POA: Diagnosis not present

## 2018-09-22 DIAGNOSIS — I428 Other cardiomyopathies: Secondary | ICD-10-CM | POA: Diagnosis not present

## 2018-09-22 DIAGNOSIS — I472 Ventricular tachycardia, unspecified: Secondary | ICD-10-CM

## 2018-09-22 DIAGNOSIS — S52691A Other fracture of lower end of right ulna, initial encounter for closed fracture: Secondary | ICD-10-CM

## 2018-09-22 NOTE — Progress Notes (Signed)
Provider:  Rexene Edison. Mariea Clonts, D.O., C.M.D. Location:  [place] Room Number: [place] of Service:  SNF (31)  PCP: Tisovec, Fransico Him, MD Patient Care Team: Tisovec, Fransico Him, MD as PCP - General (Internal Medicine) Croitoru, Dani Gobble, MD as PCP - Cardiology (Cardiology) Carolan Clines, MD as Consulting Physician (Urology)  Extended Emergency Contact Information Primary Emergency Contact: [facility] Address: [address]          [place], [place] [address] [name] of [place] Phone: [phone] Mobile Phone: [phone] Relation: Spouse Secondary Emergency Contact: Michail Sermon, [place] Phone: [phone] Mobile Phone: [phone] Relation: Daughter  Code Status: full code--wife requests Goals of Care: Advanced Directive information Advanced Directives 09/05/2018  Does Patient Have a Medical Advance Directive? No  Type of Advance Directive -  Does patient want to make changes to medical advance directive? -  Copy of [place] in Chart? -  Would patient like information on creating a medical advance directive? No - Patient declined  Pre-existing out of facility DNR order (yellow form or pink MOST form) -    Chief Complaint  Patient presents with  . New Admit To SNF    Rehab admission    HPI: Patient is a 84 y.o. male with h/o chronic systolic chf (EF 01-77%), ICM, severe aortic stenosis s/p TAVR on 08/18/18, h/o sustained VT s/p ICD, AAA, hyperlipidemia, prostate ca, recurrent UTIs, mild dementia and degenerative arthritis seen today for admission to [facility] rehab s/p hospitalization at [facility] 11/22-12/9/19 for sepsis with acute bacterial endocarditis, klebsiella pneumoniae bacteremia, and infected defibrillator pacer lead.  He had initially been hospitalized from 10/25 to 10/31 with septic shock from klebsiella UTI.  He then was hospitalized  11/5-11/7 for his TAVR procedure which was a success.  He was doing well walking 50 feet at home with his walker on 11/22 when he developed sudden shaking chills, fatigue, nausea but no vomiting and had delirium.  In the ED, he was febrile to 101.4, tachycardic to 120, and his liver panel was abnormal.  He had elevated lactic acid to 3.64, then 2.74.  WBC was normal. UA was negative.  CXR showed right base opacity--atelectasis vs pneumonia.  CT abd/pelvis showed cholelithiasis, 4.4cm infrarenal AAA, bilateral simple and complex renal cysts and colonic diveticulosis.  Blood cultures were drawn and he was initially put on empiric vanc, cefepime and metronidazole.  CTS was consulted and TTE showed EF 35-40%, diffuse hypokinesis with akinesis of inferior septal myocardium, TAVR bioprosthesis and no evidence of vegetation.  Flu panel was done and negative.  His chf was managed with daily weights and strict I/Os.  He had lovenox DVT prophylaxis.  Eventually blood cultures returned with ESBL (klebsiella) bacteremia.  TEE on 11/26 showed possible vegetation of pacemaker lead and endocarditis.  ICD system with debrillator lead was removed 09/15/18.   ID recommended 6 weeks of meropenem to end 10/27/18.  Picc was placed 12/5 and will be removed post abx 10/27/18.  He had some mild tachycardia and his toprol xl was resumed at 60m dose only and may need adjustment.  Benadryl was given for itching but did sedate him some.  He's off his diuretics for is chf and was euvolemic at discharge with weight of 97.8kg 12/9.  He's on a low sodium heart healthy diet and has f/u with cardiology on 12/11 for wound check and visit with Dr.  C 12/31.    From d/c summary---important info:   Consultants:  Cardiothoracic surgery: Dr. Alvan Dame 09/04/2018  Infectious disease: Dr. Johnnye Sima 09/05/2018  Cardiology/valve team Dr. Angelena Form 09/08/2018  Cardiology: Dr. Debara Pickett 09/06/2018  Electrophysiology: Dr. Lovena Le 09/09/2018  Procedures:  CT  abdomen and pelvis 09/04/2018  Chest x-ray 09/04/2018  2D echo 09/04/2018  Transesophageal echocardiogram 09/08/2018--- severe global reduction in LV systolic function: EF 42-59%; Status post AVR with trace periprosthetic AI, moderate MR,severe LAE, oscillating density on pacer wire concerning for vegetation.--- Per Dr. Stanford Breed  Antimicrobials:   IV cefepime 09/04/2018>>>> 09/05/2018  IV Rocephin 09/05/2018>>>>> 09/07/2018  IV Merrem 09/07/2018  IV vancomycin 09/04/2018>>>>> 09/05/2018  Pt seen and examined this morning.  Nursing, CNA and caregiver, Zigmund Daniel, were present for the visit. Mr. Giangregorio was resting in bed.  He had not done well sitting up today after getting cleaned up from incontinence.  He was weak and fatigued.  Nursing had noticed some ecchymoses on his abdomen--left upper and right lower. His feet have bilateral pitting edema.  He's had abrasions on bilateral buttocks from transfers and sliding. He was pleasantly confused.  He denied pain.  He says he doesn't know why people are surprised when he says he feels ok.  He's been historically restless at night when at the hospital and even at home if his wife is not by his side.  He's been quite delirious during his hospitalizations and at one point pulled off his bracelet from his right wrist aggressively and hurt his wrist.  He now wears a splint on this wrist.  Upon review of his record, he had fractured and dislocated his distal ulna 08/09/18.  When he was seen by ortho, they felt he had flared up an old problem and the splint was provided by PT, OT. He was to have an ortho f/u when he became more medically stable but no surgery was recommended for the wrist.  He is in contact precautions for his ESBL bacteremia (klebsiella).  He is to receive PT, OT.  Past Medical History:  Diagnosis Date  . AAA (abdominal aortic aneurysm) ([place])    7/13 3.8cm  . Arthritis    "joints" (01/11/2014)  . Asthma    "seasonal; some foods"   .  Chronic systolic CHF (congestive heart failure) ([place])   . Dementia ([place])   . HLD (hyperlipidemia)   . HOH (hard of hearing)   . Lumbar vertebral fracture (HCC)    L1- 04/11/2014   . Prostate cancer ([place])   . S/P ICD (internal cardiac defibrillator) procedure, 01/11/14 removal of ERI gen and placement of Medtronic Evera XT VR & NEW Right ventricular lead Medtronic 01/12/2014  . S/P TAVR (transcatheter aortic valve replacement)    Edwards Sapien 3 THV (size 29 mm, model # [id number], serial # [id number])  . Severe aortic stenosis   . Sleep apnea    "lost 60# & don't have it anymore" (01/11/2014)  . Ventricular tachycardia [facility])    Past Surgical History:  Procedure Laterality Date  . CARDIAC CATHETERIZATION  08/20/2004   noncritical CAD,mild global hypokinesis, EF 50%  . CARDIAC DEFIBRILLATOR PLACEMENT  08/23/2004   Medtronic  . CATARACT EXTRACTION W/ INTRAOCULAR LENS IMPLANT Right   . COLECTOMY  1990's  . CRYOABLATION N/A 02/24/2014   Procedure: CRYO ABLATION PROSTATE;  Surgeon: Ailene Rud, MD;  Location: [facility];  Service: Urology;  Laterality: N/A;  . HERNIA REPAIR     "abdomen; from colon OR"  . ICD LEAD REMOVAL  N/A 09/15/2018   Procedure: ICD LEAD REMOVAL EXTRACTION;  Surgeon: Evans Lance, MD;  Location: [facility] OR;  Service: Cardiovascular;  Laterality: N/A;  DR. BARTLE TO BACK UP  . IMPLANTABLE CARDIOVERTER DEFIBRILLATOR (ICD) GENERATOR CHANGE N/A 01/11/2014   Procedure: ICD GENERATOR CHANGE;  Surgeon: Sanda Klein, MD;  Location: [place] CATH LAB;  Service: Cardiovascular;  Laterality: N/A;  . LEAD REVISION N/A 01/11/2014   Procedure: LEAD REVISION;  Surgeon: Sanda Klein, MD;  Location: [place] CATH LAB;  Service: Cardiovascular;  Laterality: N/A;  . NM MYOCAR PERF WALL MOTION  01/29/2012   abnormal c/o infarct/scar,no ischemia present  . PROSTATE BIOPSY N/A 11/22/2013   Procedure: PROSTATE BIOPSY AND ULTRASOUND;  Surgeon: Ailene Rud, MD;  Location: [facility];  Service: Urology;   Laterality: N/A;  . RIGHT/LEFT HEART CATH AND CORONARY ANGIOGRAPHY N/A 07/06/2018   Procedure: RIGHT/LEFT HEART CATH AND CORONARY ANGIOGRAPHY;  Surgeon: Troy Sine, MD;  Location: [place] CV LAB;  Service: Cardiovascular;  Laterality: N/A;  . TEE WITHOUT CARDIOVERSION N/A 08/18/2018   Procedure: TRANSESOPHAGEAL ECHOCARDIOGRAM (TEE);  Surgeon: Burnell Blanks, MD;  Location: [place];  Service: Open Heart Surgery;  Laterality: N/A;  . TEE WITHOUT CARDIOVERSION N/A 09/08/2018   Procedure: TRANSESOPHAGEAL ECHOCARDIOGRAM (TEE);  Surgeon: Lelon Perla, MD;  Location: [facility] ENDOSCOPY;  Service: Cardiovascular;  Laterality: N/A;  . TEE WITHOUT CARDIOVERSION N/A 09/15/2018   Procedure: TRANSESOPHAGEAL ECHOCARDIOGRAM (TEE);  Surgeon: Evans Lance, MD;  Location: [place];  Service: Cardiovascular;  Laterality: N/A;  . TRANSCATHETER AORTIC VALVE REPLACEMENT, TRANSFEMORAL  08/18/2018  . TRANSCATHETER AORTIC VALVE REPLACEMENT, TRANSFEMORAL N/A 08/18/2018   Procedure: TRANSCATHETER AORTIC VALVE REPLACEMENT, TRANSFEMORAL using an Edwards 76m Aortic Valve;  Surgeon: MBurnell Blanks MD;  [place]  Service: Open Heart Surgery;  Laterality: N/A;    Social History   Socioeconomic History  . Marital status: Married    Spouse name: Not on file  . Number of children: 2  . Years of education: Not on file  . Highest education level: Not on file  Occupational History  . 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 Financial resource strain: Not on file  . Food insecurity:    Worry: Not on file    Inability: Not on file  . Transportation needs:    Medical: Not on file    Non-medical: Not on file  Tobacco Use  . Smoking status: Never Smoker  . Smokeless tobacco: Never Used  Substance and Sexual Activity  . Alcohol use: Yes    Alcohol/week: 0.0 standard drinks    Comment: 01/11/2014 "drink 1/2 of a beer twice per month   . Drug use: No  . Sexual activity: Not  Currently  Lifestyle  . Physical activity:    Days per week: Not on file    Minutes per session: Not on file  . Stress: Not on file  Relationships  . Social connections:    Talks on phone: Not on file    Gets together: Not on file    Attends religious service: Not on file    Active member of club or organization: Not on file    Attends meetings of clubs or organizations: Not on file    Relationship status: Not on file  Other Topics Concern  . Not on file  Social History Narrative  . Not on file    reports that he has never smoked. He has never used smokeless tobacco. He reports  that he drinks alcohol. He reports that he does not use drugs.  Functional Status Survey:  currently requiring assistance with adls except to feed himself, but had been ambulatory with his walker prior to this last hospitalization and caregiver cued him with adls   Family History  Problem Relation Age of Onset  . Heart failure Mother        Died of "old age" at 30  . Pneumonia Father     Health Maintenance  Topic Date Due  . TETANUS/TDAP  11/02/1946  . PNA vac Low Risk Adult (2 of 2 - PPSV23) 08/14/2014  . INFLUENZA VACCINE  Completed    Allergies  Allergen Reactions  . Crestor [Rosuvastatin] Other (See Comments)    Hurts muscles  . Lipitor [Atorvastatin] Other (See Comments)    Hurts stomach    Outpatient Encounter Medications as of 09/22/2018  Medication Sig  . acetaminophen (TYLENOL) 500 MG tablet Take 500 mg by mouth every 6 (six) hours as needed (for pain).   Marland Kitchen amiodarone (PACERONE) 200 MG tablet TAKE 1/2 TABLET BY MOUTH EVERY DAY  . aspirin 81 MG chewable tablet Chew 1 tablet (81 mg total) by mouth daily.  . cholecalciferol (VITAMIN D) 1000 units tablet Take 2,000 Units by mouth at bedtime.  . clopidogrel (PLAVIX) 75 MG tablet Take 1 tablet (75 mg total) by mouth daily with breakfast.  . ertapenem (INVANZ) IVPB Inject 1 g into the vein daily. Indication:  ESBL Klebsiella endocarditis    Last Day of Therapy:  10/27/18 Labs - Once weekly:  CBC/D and BMP, Labs - Every other week:  ESR and CRP  . ezetimibe-simvastatin (VYTORIN) 10-20 MG per tablet Take 1 tablet by mouth at bedtime.  . metoprolol succinate (TOPROL-XL) 25 MG 24 hr tablet Take 1 tablet (25 mg total) by mouth daily. Take with or immediately following a meal.  . Multiple Vitamins-Minerals (PRESERVISION AREDS 2 PO) Take 1 capsule by mouth daily.   . polyethylene glycol (MIRALAX / GLYCOLAX) packet Take 17 g by mouth every evening.  . senna (SENOKOT) 8.6 MG TABS tablet Take 1 tablet (8.6 mg total) by mouth daily.   No facility-administered encounter medications on file as of 09/22/2018.     Review of Systems  Constitutional: Positive for malaise/fatigue. Negative for chills and fever.  HENT: Positive for hearing loss. Negative for congestion.   Eyes: Negative for blurred vision.  Respiratory: Negative for shortness of breath.   Cardiovascular: Negative for chest pain, palpitations and leg swelling.  Gastrointestinal: Negative for abdominal pain, blood in stool, constipation, diarrhea, melena, nausea and vomiting.  Genitourinary: Negative for dysuria.       Wife has requested condom cath for him which is in place  Musculoskeletal: Negative for falls and joint pain.  Neurological: Positive for dizziness and weakness. Negative for headaches.  Endo/Heme/Allergies: Bruises/bleeds easily.  Psychiatric/Behavioral: Positive for memory loss. Negative for depression and suicidal ideas. The patient has insomnia. The patient is not nervous/anxious.        Caregiver reports he does not sleep if his wife is not present    Vitals:   09/22/18 1029  BP: 125/70  Pulse: 64  Resp: 15  Temp: (!) 97.5 F (36.4 C)  TempSrc: Oral  Weight: 215 lb (97.5 kg)  Height: '5\' 10"'  (1.778 m)   Body mass index is 30.85 kg/m. Physical Exam  Constitutional: He appears well-developed. No distress.  HENT:  Head: Normocephalic and  atraumatic.  Right Ear: External ear normal.  Left Ear: External ear normal.  Nose: Nose normal.  Mouth/Throat: Oropharynx is clear and moist.  Hard of hearing  Eyes: Pupils are equal, round, and reactive to light. Conjunctivae and EOM are normal.  Neck: Normal range of motion. Neck supple.  Cardiovascular: Normal rate and regular rhythm.  Murmur heard. Pitting edema bilateral feet--edema up into thighs, but only pits in feet  Pulmonary/Chest: Effort normal and breath sounds normal. He has no rales.  Abdominal: Soft. Bowel sounds are normal. He exhibits no distension and no mass. There is no tenderness. There is no rebound and no guarding.  Genitourinary:  Genitourinary Comments: Condom cath in place draining tea-colored urine  Musculoskeletal: He exhibits no tenderness.  No tenderness of right wrist; using wrist splint--almost no flexion of wrist  Neurological: He is alert. No cranial nerve deficit.  Skin: Skin is warm and dry.  Left lower quadrant ecchymoses, right upper quadrant ecchymoses, bilateral buttocks abrasions; ICD removal site looks great today--sutures intact and no drainage, erythema, warmth noted; picc site without erythema, warmth or drainage     Psychiatric: He has a normal mood and affect.  Pleasantly confused and hearing affects his ability to respond appropriately    Labs reviewed: Basic Metabolic Panel: Recent Labs    08/07/18 0512 08/07/18 1411  09/08/18 0719 09/09/18 0826  09/15/18 0343  09/17/18 0332 09/18/18 0850 09/21/18 0413  NA 134* 138   < > 135 132*   < > 133*   < > 132* 135 137  K 3.6 3.2*   < > 3.5 3.7   < > 3.7   < > 3.6 3.5 3.6  CL 100 107   < > 105 103   < > 101   < > 99 102 103  CO2 26 25   < > 23 21*   < > 24   < > '24 24 25  ' GLUCOSE 164* 102*   < > 129* 126*   < > 155*   < > 121* 171* 130*  BUN 16 12   < > 12 13   < > 17   < > '16 14 19  ' CREATININE 1.30* 1.16   < > 0.95 0.92   < > 1.01   < > 0.77 0.88 0.81  CALCIUM 8.8* 8.2*   < >  8.1* 8.2*   < > 8.1*   < > 7.7* 7.8* 7.8*  MG 2.2 2.2   < > 2.3 2.2  --  2.4  --   --   --   --   PHOS 3.6 3.3  --   --   --   --   --   --   --   --   --    < > = values in this interval not displayed.   Liver Function Tests: Recent Labs    09/07/18 0303 09/08/18 0719 09/09/18 0826  AST 34 28 26  ALT 66* 47* 42  ALKPHOS 203* 175* 155*  BILITOT 1.2 1.2 1.4*  PROT 5.7* 5.7* 5.6*  ALBUMIN 2.5* 2.4* 2.5*   Recent Labs    08/07/18 1411  LIPASE 26  AMYLASE 19*   No results for input(s): AMMONIA in the last 8760 hours. CBC: Recent Labs    09/17/18 0332 09/18/18 0850 09/21/18 0413  WBC 6.1 6.3 5.5  NEUTROABS 4.0 4.6 2.9  HGB 9.8* 9.9* 9.7*  HCT 28.1* 29.0* 28.5*  MCV 94.9 96.3 97.3  PLT 131* 129* 144*   Cardiac Enzymes:  Recent Labs    08/07/18 1816 08/08/18 0012 09/04/18 2044  TROPONINI <0.03 0.03* 0.03*   BNP: Invalid input(s): POCBNP Lab Results  Component Value Date   HGBA1C 5.2 08/13/2018   Lab Results  Component Value Date   TSH 1.75 02/05/2017   No results found for: VITAMINB12 No results found for: FOLATE No results found for: IRON, TIBC, FERRITIN  Imaging and Procedures obtained prior to SNF admission: Ct Abdomen Pelvis W Contrast  Result Date: 09/04/2018 CLINICAL DATA:  Abdominal pain and fever EXAM: CT ABDOMEN AND PELVIS WITH CONTRAST TECHNIQUE: Multidetector CT imaging of the abdomen and pelvis was performed using the standard protocol following bolus administration of intravenous contrast. CONTRAST:  157m OMNIPAQUE IOHEXOL 300 MG/ML  SOLN COMPARISON:  08/03/2018 FINDINGS: Lower chest: Cardiomegaly with evidence of prior TAVR. Coronary arteriosclerosis is noted. Bibasilar dependent atelectasis. Hepatobiliary: Numerous gallstones are identified within the gallbladder without secondary signs of cholecystitis. No mural thickening or pericholecystic fluid is noted. No space-occupying mass of the liver nor biliary dilatation is seen. Pancreas: Atrophic  pancreas without inflammation or ductal dilatation. Tiny subcentimeter cystic foci noted of the body of the pancreas, stable in appearance dating back to 2016 consistent benign findings. Spleen: Normal Adrenals/Urinary Tract: Normal bilateral adrenal glands. Bilateral renal cysts, the largest on the right is interpolar and posterior with punctate calcification associated with it. It measures up to 4.3 cm in diameter. The left kidney demonstrates a 4.5 cm in diameter upper pole renal cyst with smaller interpolar cysts noted. No hydroureteronephrosis. The urinary bladder is decompressed in appearance. Stomach/Bowel: The stomach is decompressed in appearance. The duodenal sweep and ligament of Treitz are normal. No small bowel obstruction, mass or inflammation. Normal appearing appendix. Nonobstructed large bowel without inflammatory change. Scattered colonic diverticulosis without acute diverticulitis is identified along the descending sigmoid colon. Vascular/Lymphatic: 4.4 cm infrarenal fusiform abdominal aortic aneurysm with moderate aortoiliac and branch vessel atherosclerosis. Findings are unchanged from recent comparison. No lymphadenopathy by CT size criteria. Reproductive: Normal size prostate and seminal vesicles. Nonspecific metallic density projects between the rectum posterior wall of bladder. Other: No free air or free fluid. 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 IMPRESSION: 1. 4.4 cm infrarenal fusiform abdominal aortic aneurysm. Recommend followup by ultrasound in 1 year. This recommendation follows ACR consensus guidelines: White Paper of the ACR Incidental Findings Committee II on Vascular Findings. J Am Coll Radiol 2013; 10:789-794. 2. Bilateral simple and complex renal cysts. 3. Uncomplicated cholelithiasis. 4. Colonic diverticulosis without acute diverticulitis. 5. 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 Electronically  Signed   By: DAshley RoyaltyM.D.   On: 09/04/2018 17:56   Dg Chest Port 1 View  Result Date: 09/04/2018 CLINICAL DATA:  Fever and altered mental status. EXAM: PORTABLE CHEST 1 VIEW COMPARISON:  08/18/2018 FINDINGS: Cardiomegaly and AICD again noted. This is a low volume film. RIGHT basilar opacity identified-favor atelectasis over airspace disease/pneumonia. No pleural effusion or pneumothorax. No acute bony abnormality. IMPRESSION: RIGHT basilar opacity-favor atelectasis over airspace disease/pneumonia, but correlate clinically. Cardiomegaly. Electronically Signed   By: JMargarette CanadaM.D.   On: 09/04/2018 14:59   UK[place]Abdomen Limited Ruq  Result Date: 09/04/2018 CLINICAL DATA:  RIGHT upper quadrant pain. EXAM: ULTRASOUND ABDOMEN LIMITED RIGHT UPPER QUADRANT COMPARISON:  CT abdomen and pelvis September 04, 2018 FINDINGS: Gallbladder: Numerous echogenic stones within the gallbladder measuring less than 1 cm without gallbladder wall thickening or pericholecystic fluid. Wall echo shadow sign.  No sonographic Murphy sign elicited. Common bile duct: Diameter: 8 mm, normal for age. Liver: No focal lesion identified. Within normal limits in parenchymal echogenicity. Portal vein is patent on color Doppler imaging with normal direction of blood flow towards the liver. IMPRESSION: 1. Cholelithiasis without sonographic findings of acute cholecystitis. Electronically Signed   By: Elon Alas M.D.   On: 09/04/2018 21:25    Assessment/Plan 1. Infection of implantable cardioverter-defibrillator (ICD) lead, initial encounter ([place]) -ICD removed after vegetation noted on lead on echo -to complete 6 wks of meropenem/ertapenem (came on ertapenem though discharge read meropenem) on 10/27/18 -getting regular labs as below to monitor progress -for wound check and suture removal with cardiology tomorrow -site looks great today--sutures intact and no drainage, erythema, warmth noted  2. Acute bacterial endocarditis -due to  above and caused by ESBL/klebsiella bacteremia--pt had prior UTI and was treated for it during last hospitalization -no vegetations were seen on valves or new prosthesis only on ICD lead which was since removed  3. Klebsiella pneumoniae sepsis ([place]) -again, to complete abx via picc for 6 wks; picc site without erythema, warmth or drainage  4. Ventricular tachycardia ([place]) -was indication for ICD placement historically; now pt w/o device; his wife has requested full code status for him here in rehab -toprol xl was resumed at a lower dose than he was taking at discharge due to some tachycardia--may need titration here  5. Chronic systolic CHF (congestive heart failure) (HCC) -appears this had further diminished with infection and recent valve replacement based on last echo -would qualify for entresto post-treatment for this infection and stabilization -check daily weights and notify me or NP if up 3 lbs in 1 day or 5 lbs in 1 week -as of today, he is actually down 4 lbs from 12/7 pharmacy note weight of 216.6 lbs   6. Abdominal aortic aneurysm (AAA) without rupture ([place]) -noted on CT during hospitalization--will defer to PCP about f/u imaging of this after we see how he does recovering from his current infection  7. Vascular dementia without behavioral disturbance ([place]) -had been quite functional with cuing at home from caregivers--apparently did not sleep well at night if his wife was not at bedside; has caregivers day and night here in rehab at this point  8. S/P TAVR (transcatheter aortic valve replacement) -just with last hospitalization before this one; has f/u appt for echo to reassess the prosthesis coming up  9. Prostate cancer [facility]) -s/p cryo treatment in 2015; not currently on txs, but followed by urology--need Dr. Loren Racer notes for more details of current state  10. Hyperlipidemia, unspecified hyperlipidemia type -did not tolerate crestor or lipitor; currently tolerating  vytorin therapy  11. Nonischemic cardiomyopathy ([place]) -per records, has nonobstructive CAD; cont amiodarone, asa, plavix, toprol xl, vytorin  12. Coronary artery disease involving native coronary artery of native heart without angina pectoris -nonobstructive, cont secondary prevention strategy as in #11  13. Other closed fracture of distal end of right ulna, initial encounter -continues on wrist splint--ortho had advised no surgery as injury felt ot be chronic; was also dislocated  14. Weakness acquired in ICU -notable, will receive PT, OT here -apparently was up with walker on his own before last hospitalization (thought I noted that rehab was recommended after last hospitalization also but he refused it)  Obtain last note from PCP to update health maintenance in epic; daily weights with parameters ordered  Family/ staff Communication: discussed with snf nurse, caregiver  Labs/tests ordered:  Has  cbc, cmp 12/16, then serial cbc, bmp weekly from 12/23; then ESR and CRP q2 wks starting 12/23  Keep all scheduled appts   Jacub Waiters L. Yasin Ducat, D.O. [facility] [address], [place] [address] Cell Phone (Mon-Fri 8am-5pm):  [phone] On Call:  [phone] & follow prompts after 5pm & weekends Office Phone:  [phone] Office Fax:  [phone]

## 2018-09-22 NOTE — Telephone Encounter (Signed)
Received a skype message from Theodosia Quay, RN (TAVR). Pt was wanted to know if patient had to come to his appt on Wed 09-23-18. After consulting w/ Device Tech RN. I called pt wife and informed her that pt had to keep appt from 09-23-18. Pt wife verbalized understanding.

## 2018-09-23 ENCOUNTER — Ambulatory Visit: Payer: Medicare Other | Admitting: Physician Assistant

## 2018-09-23 ENCOUNTER — Other Ambulatory Visit (HOSPITAL_COMMUNITY): Payer: Medicare Other

## 2018-09-23 ENCOUNTER — Ambulatory Visit (INDEPENDENT_AMBULATORY_CARE_PROVIDER_SITE_OTHER): Payer: Medicare Other | Admitting: *Deleted

## 2018-09-23 DIAGNOSIS — I5023 Acute on chronic systolic (congestive) heart failure: Secondary | ICD-10-CM | POA: Diagnosis not present

## 2018-09-23 DIAGNOSIS — A419 Sepsis, unspecified organism: Secondary | ICD-10-CM | POA: Diagnosis not present

## 2018-09-23 DIAGNOSIS — M62562 Muscle wasting and atrophy, not elsewhere classified, left lower leg: Secondary | ICD-10-CM | POA: Diagnosis not present

## 2018-09-23 DIAGNOSIS — M25511 Pain in right shoulder: Secondary | ICD-10-CM | POA: Diagnosis not present

## 2018-09-23 DIAGNOSIS — R7881 Bacteremia: Secondary | ICD-10-CM | POA: Diagnosis not present

## 2018-09-23 DIAGNOSIS — R601 Generalized edema: Secondary | ICD-10-CM | POA: Diagnosis not present

## 2018-09-23 DIAGNOSIS — Z952 Presence of prosthetic heart valve: Secondary | ICD-10-CM | POA: Diagnosis not present

## 2018-09-23 DIAGNOSIS — I330 Acute and subacute infective endocarditis: Secondary | ICD-10-CM

## 2018-09-23 DIAGNOSIS — M2550 Pain in unspecified joint: Secondary | ICD-10-CM | POA: Diagnosis not present

## 2018-09-23 DIAGNOSIS — I351 Nonrheumatic aortic (valve) insufficiency: Secondary | ICD-10-CM | POA: Diagnosis not present

## 2018-09-23 DIAGNOSIS — I350 Nonrheumatic aortic (valve) stenosis: Secondary | ICD-10-CM | POA: Diagnosis not present

## 2018-09-23 DIAGNOSIS — F0390 Unspecified dementia without behavioral disturbance: Secondary | ICD-10-CM | POA: Diagnosis not present

## 2018-09-23 DIAGNOSIS — N3946 Mixed incontinence: Secondary | ICD-10-CM | POA: Diagnosis not present

## 2018-09-23 DIAGNOSIS — Z7401 Bed confinement status: Secondary | ICD-10-CM | POA: Diagnosis not present

## 2018-09-23 DIAGNOSIS — M62561 Muscle wasting and atrophy, not elsewhere classified, right lower leg: Secondary | ICD-10-CM | POA: Diagnosis not present

## 2018-09-23 DIAGNOSIS — R2689 Other abnormalities of gait and mobility: Secondary | ICD-10-CM | POA: Diagnosis not present

## 2018-09-23 DIAGNOSIS — T827XXS Infection and inflammatory reaction due to other cardiac and vascular devices, implants and grafts, sequela: Secondary | ICD-10-CM | POA: Diagnosis not present

## 2018-09-23 DIAGNOSIS — M6389 Disorders of muscle in diseases classified elsewhere, multiple sites: Secondary | ICD-10-CM | POA: Diagnosis not present

## 2018-09-23 DIAGNOSIS — R278 Other lack of coordination: Secondary | ICD-10-CM | POA: Diagnosis not present

## 2018-09-24 ENCOUNTER — Telehealth: Payer: Self-pay

## 2018-09-24 DIAGNOSIS — M62562 Muscle wasting and atrophy, not elsewhere classified, left lower leg: Secondary | ICD-10-CM | POA: Diagnosis not present

## 2018-09-24 DIAGNOSIS — M62561 Muscle wasting and atrophy, not elsewhere classified, right lower leg: Secondary | ICD-10-CM | POA: Diagnosis not present

## 2018-09-24 DIAGNOSIS — M25511 Pain in right shoulder: Secondary | ICD-10-CM | POA: Diagnosis not present

## 2018-09-24 DIAGNOSIS — R2689 Other abnormalities of gait and mobility: Secondary | ICD-10-CM | POA: Diagnosis not present

## 2018-09-24 DIAGNOSIS — M6389 Disorders of muscle in diseases classified elsewhere, multiple sites: Secondary | ICD-10-CM | POA: Diagnosis not present

## 2018-09-24 DIAGNOSIS — N3946 Mixed incontinence: Secondary | ICD-10-CM | POA: Diagnosis not present

## 2018-09-24 NOTE — Telephone Encounter (Signed)
LMOM for William [name] to call back for Dr. Tanna Furry recommendations.  Received verbal orders from Dr. Lovena Le to STOP ASA and Plavix until patient's f/u with Dr. Sallyanne Kuster on 12/31. Apply pressure to site until bleeding stops. Apply Steri-strips to unapproximated area. Call the [facility] for any signs/symptoms of infection.

## 2018-09-24 NOTE — Telephone Encounter (Signed)
Suzan a nurse from [facility] is calling because pt is having a lot of bleeding from his from wound site. The pt had stitches removed on 09/13/2018. The nurse would like for someone to give her a call back at [phone] and ask for Suzan or the rehab nurse. She needs to know what to do as soon as possible.

## 2018-09-24 NOTE — Telephone Encounter (Signed)
Attempted call to [facility] rehab--no answer on unit phone, did not leave another VM.  Called main desk at [facility], spoke with Debarah Crape, receptionist, left message with her and LMOVM for [facility] rehab unit.

## 2018-09-24 NOTE — Telephone Encounter (Signed)
Third attempt:  LMOVM for rehab unit. Called back to receptionist desk and requested to speak with nursing supervisor.  Called and spoke with Dayna Ramus, RN, Nursing Supervisor. Relayed Dr. Tanna Furry instructions to STOP ASA and Plavix until patient's f/u on 12/31. Apply constant pressure to site until oozing stops, likely 15-20 minutes. Reapply Steri-strips to unapproximated area.  Gave the direct DC phone number for any other wound issues. Beth verbalizes understanding of all verbal orders and denies additional questions at this time.

## 2018-09-24 NOTE — Telephone Encounter (Signed)
Spoke with [name] at [facility]. She reports staff was turning patient last night to change his brief, then he started bleeding from his incision. Proximal end of incision is gapped, no longer fully approximated. Steri-strips were applied but became saturated. As of this morning, patient has ~4x5" area of bleeding on gauze. Mariana Single I will call her back after discussing with Dr. Lovena Le.

## 2018-09-25 DIAGNOSIS — M25511 Pain in right shoulder: Secondary | ICD-10-CM | POA: Diagnosis not present

## 2018-09-25 DIAGNOSIS — R2689 Other abnormalities of gait and mobility: Secondary | ICD-10-CM | POA: Diagnosis not present

## 2018-09-25 DIAGNOSIS — N3946 Mixed incontinence: Secondary | ICD-10-CM | POA: Diagnosis not present

## 2018-09-25 DIAGNOSIS — M62561 Muscle wasting and atrophy, not elsewhere classified, right lower leg: Secondary | ICD-10-CM | POA: Diagnosis not present

## 2018-09-25 DIAGNOSIS — M62562 Muscle wasting and atrophy, not elsewhere classified, left lower leg: Secondary | ICD-10-CM | POA: Diagnosis not present

## 2018-09-25 DIAGNOSIS — M6389 Disorders of muscle in diseases classified elsewhere, multiple sites: Secondary | ICD-10-CM | POA: Diagnosis not present

## 2018-09-25 NOTE — Progress Notes (Signed)
Sutures removed s/p ICD extraction, incision well healed, no redness or edema noted. Instructions sent with pt back to SNF to call device clinic for swelling or drainage.

## 2018-09-28 DIAGNOSIS — D649 Anemia, unspecified: Secondary | ICD-10-CM | POA: Diagnosis not present

## 2018-09-28 DIAGNOSIS — M25511 Pain in right shoulder: Secondary | ICD-10-CM | POA: Diagnosis not present

## 2018-09-28 DIAGNOSIS — M6389 Disorders of muscle in diseases classified elsewhere, multiple sites: Secondary | ICD-10-CM | POA: Diagnosis not present

## 2018-09-28 DIAGNOSIS — I330 Acute and subacute infective endocarditis: Secondary | ICD-10-CM | POA: Diagnosis not present

## 2018-09-28 DIAGNOSIS — R2689 Other abnormalities of gait and mobility: Secondary | ICD-10-CM | POA: Diagnosis not present

## 2018-09-28 DIAGNOSIS — M62562 Muscle wasting and atrophy, not elsewhere classified, left lower leg: Secondary | ICD-10-CM | POA: Diagnosis not present

## 2018-09-28 DIAGNOSIS — N3946 Mixed incontinence: Secondary | ICD-10-CM | POA: Diagnosis not present

## 2018-09-28 DIAGNOSIS — M62561 Muscle wasting and atrophy, not elsewhere classified, right lower leg: Secondary | ICD-10-CM | POA: Diagnosis not present

## 2018-09-28 NOTE — H&P (Signed)
ICD Criteria  Current LVEF:50%. Within 12 months prior to implant: Yes   Heart failure history: Yes, Class II  Cardiomyopathy history: Yes, Non-Ischemic Cardiomyopathy.  Atrial Fibrillation/Atrial Flutter: No.  Ventricular tachycardia history: Yes, No hemodynamic instability. VT Type: Sustained Ventricular Tachycardia - Monomorphic.  Cardiac arrest history: No.  History of syndromes with risk of sudden death: No.  Previous ICD: Yes, Reason for ICD:  Primary prevention.  Current ICD indication: Secondary  PPM indication: No.  Class I or II Bradycardia indication present: No  Beta Blocker therapy for 3 or more months: Yes, prescribed.   Ace Inhibitor/ARB therapy for 3 or more months: No, medical reason.

## 2018-09-29 DIAGNOSIS — R2689 Other abnormalities of gait and mobility: Secondary | ICD-10-CM | POA: Diagnosis not present

## 2018-09-29 DIAGNOSIS — N3946 Mixed incontinence: Secondary | ICD-10-CM | POA: Diagnosis not present

## 2018-09-29 DIAGNOSIS — E7800 Pure hypercholesterolemia, unspecified: Secondary | ICD-10-CM | POA: Diagnosis not present

## 2018-09-29 DIAGNOSIS — M25511 Pain in right shoulder: Secondary | ICD-10-CM | POA: Diagnosis not present

## 2018-09-29 DIAGNOSIS — I2510 Atherosclerotic heart disease of native coronary artery without angina pectoris: Secondary | ICD-10-CM | POA: Diagnosis not present

## 2018-09-29 DIAGNOSIS — M62561 Muscle wasting and atrophy, not elsewhere classified, right lower leg: Secondary | ICD-10-CM | POA: Diagnosis not present

## 2018-09-29 DIAGNOSIS — T827XXA Infection and inflammatory reaction due to other cardiac and vascular devices, implants and grafts, initial encounter: Secondary | ICD-10-CM | POA: Diagnosis not present

## 2018-09-29 DIAGNOSIS — M62562 Muscle wasting and atrophy, not elsewhere classified, left lower leg: Secondary | ICD-10-CM | POA: Diagnosis not present

## 2018-09-29 DIAGNOSIS — M2550 Pain in unspecified joint: Secondary | ICD-10-CM | POA: Diagnosis not present

## 2018-09-29 DIAGNOSIS — D649 Anemia, unspecified: Secondary | ICD-10-CM | POA: Diagnosis not present

## 2018-09-29 DIAGNOSIS — M6389 Disorders of muscle in diseases classified elsewhere, multiple sites: Secondary | ICD-10-CM | POA: Diagnosis not present

## 2018-09-30 DIAGNOSIS — R2689 Other abnormalities of gait and mobility: Secondary | ICD-10-CM | POA: Diagnosis not present

## 2018-09-30 DIAGNOSIS — N3946 Mixed incontinence: Secondary | ICD-10-CM | POA: Diagnosis not present

## 2018-09-30 DIAGNOSIS — M25511 Pain in right shoulder: Secondary | ICD-10-CM | POA: Diagnosis not present

## 2018-09-30 DIAGNOSIS — M62562 Muscle wasting and atrophy, not elsewhere classified, left lower leg: Secondary | ICD-10-CM | POA: Diagnosis not present

## 2018-09-30 DIAGNOSIS — M62561 Muscle wasting and atrophy, not elsewhere classified, right lower leg: Secondary | ICD-10-CM | POA: Diagnosis not present

## 2018-09-30 DIAGNOSIS — M6389 Disorders of muscle in diseases classified elsewhere, multiple sites: Secondary | ICD-10-CM | POA: Diagnosis not present

## 2018-10-01 DIAGNOSIS — M25511 Pain in right shoulder: Secondary | ICD-10-CM | POA: Diagnosis not present

## 2018-10-01 DIAGNOSIS — M62562 Muscle wasting and atrophy, not elsewhere classified, left lower leg: Secondary | ICD-10-CM | POA: Diagnosis not present

## 2018-10-01 DIAGNOSIS — M6389 Disorders of muscle in diseases classified elsewhere, multiple sites: Secondary | ICD-10-CM | POA: Diagnosis not present

## 2018-10-01 DIAGNOSIS — R2689 Other abnormalities of gait and mobility: Secondary | ICD-10-CM | POA: Diagnosis not present

## 2018-10-01 DIAGNOSIS — M62561 Muscle wasting and atrophy, not elsewhere classified, right lower leg: Secondary | ICD-10-CM | POA: Diagnosis not present

## 2018-10-01 DIAGNOSIS — N3946 Mixed incontinence: Secondary | ICD-10-CM | POA: Diagnosis not present

## 2018-10-02 DIAGNOSIS — N3946 Mixed incontinence: Secondary | ICD-10-CM | POA: Diagnosis not present

## 2018-10-02 DIAGNOSIS — M25511 Pain in right shoulder: Secondary | ICD-10-CM | POA: Diagnosis not present

## 2018-10-02 DIAGNOSIS — R2689 Other abnormalities of gait and mobility: Secondary | ICD-10-CM | POA: Diagnosis not present

## 2018-10-02 DIAGNOSIS — M62562 Muscle wasting and atrophy, not elsewhere classified, left lower leg: Secondary | ICD-10-CM | POA: Diagnosis not present

## 2018-10-02 DIAGNOSIS — M6389 Disorders of muscle in diseases classified elsewhere, multiple sites: Secondary | ICD-10-CM | POA: Diagnosis not present

## 2018-10-02 DIAGNOSIS — M62561 Muscle wasting and atrophy, not elsewhere classified, right lower leg: Secondary | ICD-10-CM | POA: Diagnosis not present

## 2018-10-03 DIAGNOSIS — R2689 Other abnormalities of gait and mobility: Secondary | ICD-10-CM | POA: Diagnosis not present

## 2018-10-03 DIAGNOSIS — M62562 Muscle wasting and atrophy, not elsewhere classified, left lower leg: Secondary | ICD-10-CM | POA: Diagnosis not present

## 2018-10-03 DIAGNOSIS — M6389 Disorders of muscle in diseases classified elsewhere, multiple sites: Secondary | ICD-10-CM | POA: Diagnosis not present

## 2018-10-03 DIAGNOSIS — M62561 Muscle wasting and atrophy, not elsewhere classified, right lower leg: Secondary | ICD-10-CM | POA: Diagnosis not present

## 2018-10-03 DIAGNOSIS — M25511 Pain in right shoulder: Secondary | ICD-10-CM | POA: Diagnosis not present

## 2018-10-03 DIAGNOSIS — N3946 Mixed incontinence: Secondary | ICD-10-CM | POA: Diagnosis not present

## 2018-10-04 DIAGNOSIS — M6389 Disorders of muscle in diseases classified elsewhere, multiple sites: Secondary | ICD-10-CM | POA: Diagnosis not present

## 2018-10-04 DIAGNOSIS — M62561 Muscle wasting and atrophy, not elsewhere classified, right lower leg: Secondary | ICD-10-CM | POA: Diagnosis not present

## 2018-10-04 DIAGNOSIS — M62562 Muscle wasting and atrophy, not elsewhere classified, left lower leg: Secondary | ICD-10-CM | POA: Diagnosis not present

## 2018-10-04 DIAGNOSIS — N3946 Mixed incontinence: Secondary | ICD-10-CM | POA: Diagnosis not present

## 2018-10-04 DIAGNOSIS — M25511 Pain in right shoulder: Secondary | ICD-10-CM | POA: Diagnosis not present

## 2018-10-04 DIAGNOSIS — R2689 Other abnormalities of gait and mobility: Secondary | ICD-10-CM | POA: Diagnosis not present

## 2018-10-05 DIAGNOSIS — M25511 Pain in right shoulder: Secondary | ICD-10-CM | POA: Diagnosis not present

## 2018-10-05 DIAGNOSIS — M62562 Muscle wasting and atrophy, not elsewhere classified, left lower leg: Secondary | ICD-10-CM | POA: Diagnosis not present

## 2018-10-05 DIAGNOSIS — M6389 Disorders of muscle in diseases classified elsewhere, multiple sites: Secondary | ICD-10-CM | POA: Diagnosis not present

## 2018-10-05 DIAGNOSIS — M62561 Muscle wasting and atrophy, not elsewhere classified, right lower leg: Secondary | ICD-10-CM | POA: Diagnosis not present

## 2018-10-05 DIAGNOSIS — N3946 Mixed incontinence: Secondary | ICD-10-CM | POA: Diagnosis not present

## 2018-10-05 DIAGNOSIS — R2689 Other abnormalities of gait and mobility: Secondary | ICD-10-CM | POA: Diagnosis not present

## 2018-10-05 LAB — CBC AND DIFFERENTIAL
HCT: 33 — AB (ref 41–53)
Hemoglobin: 11.7 — AB (ref 13.5–17.5)
Platelets: 158 (ref 150–399)
WBC: 8.2

## 2018-10-05 LAB — BASIC METABOLIC PANEL
BUN: 18 (ref 4–21)
Creatinine: 0.8 (ref 0.6–1.3)
Glucose: 128
Potassium: 4.2 (ref 3.4–5.3)
Sodium: 137 (ref 137–147)

## 2018-10-06 ENCOUNTER — Encounter: Payer: Self-pay | Admitting: Internal Medicine

## 2018-10-06 DIAGNOSIS — N3946 Mixed incontinence: Secondary | ICD-10-CM | POA: Diagnosis not present

## 2018-10-06 DIAGNOSIS — R2689 Other abnormalities of gait and mobility: Secondary | ICD-10-CM | POA: Diagnosis not present

## 2018-10-06 DIAGNOSIS — M6389 Disorders of muscle in diseases classified elsewhere, multiple sites: Secondary | ICD-10-CM | POA: Diagnosis not present

## 2018-10-06 DIAGNOSIS — M62562 Muscle wasting and atrophy, not elsewhere classified, left lower leg: Secondary | ICD-10-CM | POA: Diagnosis not present

## 2018-10-06 DIAGNOSIS — M25511 Pain in right shoulder: Secondary | ICD-10-CM | POA: Diagnosis not present

## 2018-10-06 DIAGNOSIS — M62561 Muscle wasting and atrophy, not elsewhere classified, right lower leg: Secondary | ICD-10-CM | POA: Diagnosis not present

## 2018-10-08 ENCOUNTER — Telehealth: Payer: Self-pay | Admitting: Cardiovascular Disease

## 2018-10-08 DIAGNOSIS — N3946 Mixed incontinence: Secondary | ICD-10-CM | POA: Diagnosis not present

## 2018-10-08 DIAGNOSIS — R2689 Other abnormalities of gait and mobility: Secondary | ICD-10-CM | POA: Diagnosis not present

## 2018-10-08 DIAGNOSIS — M6389 Disorders of muscle in diseases classified elsewhere, multiple sites: Secondary | ICD-10-CM | POA: Diagnosis not present

## 2018-10-08 DIAGNOSIS — M62561 Muscle wasting and atrophy, not elsewhere classified, right lower leg: Secondary | ICD-10-CM | POA: Diagnosis not present

## 2018-10-08 DIAGNOSIS — M25511 Pain in right shoulder: Secondary | ICD-10-CM | POA: Diagnosis not present

## 2018-10-08 DIAGNOSIS — M62562 Muscle wasting and atrophy, not elsewhere classified, left lower leg: Secondary | ICD-10-CM | POA: Diagnosis not present

## 2018-10-08 NOTE — Telephone Encounter (Signed)
New Message:    Patient wife would like for someone to call her she has some questions.

## 2018-10-08 NOTE — Telephone Encounter (Signed)
Spoke with pt's wife who states pt had an appointment scheduled on 12/31 to see Dr. Loletha Grayer,  which she cancelled due to pt in rehab. Wife states pt recently just had  ICD removed due to vegetation of pacemaker. Wife states she would like for pt to get a little stronger before she bring him out for an appointment and questioning as to when Dr. Loletha Grayer recommends he be seen.

## 2018-10-09 ENCOUNTER — Emergency Department (HOSPITAL_COMMUNITY): Payer: Medicare Other

## 2018-10-09 ENCOUNTER — Encounter (HOSPITAL_COMMUNITY): Payer: Self-pay | Admitting: Emergency Medicine

## 2018-10-09 ENCOUNTER — Emergency Department (HOSPITAL_COMMUNITY)
Admission: EM | Admit: 2018-10-09 | Discharge: 2018-10-10 | Disposition: A | Discharge status: Home / Self Care | Payer: Medicare Other | Attending: Emergency Medicine | Admitting: Emergency Medicine

## 2018-10-09 DIAGNOSIS — S0990XA Unspecified injury of head, initial encounter: Secondary | ICD-10-CM | POA: Diagnosis not present

## 2018-10-09 DIAGNOSIS — M25511 Pain in right shoulder: Secondary | ICD-10-CM | POA: Diagnosis not present

## 2018-10-09 DIAGNOSIS — M6389 Disorders of muscle in diseases classified elsewhere, multiple sites: Secondary | ICD-10-CM | POA: Diagnosis not present

## 2018-10-09 DIAGNOSIS — E785 Hyperlipidemia, unspecified: Secondary | ICD-10-CM | POA: Insufficient documentation

## 2018-10-09 DIAGNOSIS — W19XXXA Unspecified fall, initial encounter: Secondary | ICD-10-CM | POA: Diagnosis not present

## 2018-10-09 DIAGNOSIS — M62561 Muscle wasting and atrophy, not elsewhere classified, right lower leg: Secondary | ICD-10-CM | POA: Diagnosis not present

## 2018-10-09 DIAGNOSIS — Y939 Activity, unspecified: Secondary | ICD-10-CM | POA: Diagnosis not present

## 2018-10-09 DIAGNOSIS — Y998 Other external cause status: Secondary | ICD-10-CM | POA: Insufficient documentation

## 2018-10-09 DIAGNOSIS — S79912A Unspecified injury of left hip, initial encounter: Secondary | ICD-10-CM | POA: Diagnosis not present

## 2018-10-09 DIAGNOSIS — I447 Left bundle-branch block, unspecified: Secondary | ICD-10-CM | POA: Diagnosis not present

## 2018-10-09 DIAGNOSIS — S199XXA Unspecified injury of neck, initial encounter: Secondary | ICD-10-CM | POA: Diagnosis not present

## 2018-10-09 DIAGNOSIS — M62562 Muscle wasting and atrophy, not elsewhere classified, left lower leg: Secondary | ICD-10-CM | POA: Diagnosis not present

## 2018-10-09 DIAGNOSIS — I2510 Atherosclerotic heart disease of native coronary artery without angina pectoris: Secondary | ICD-10-CM | POA: Diagnosis not present

## 2018-10-09 DIAGNOSIS — R404 Transient alteration of awareness: Secondary | ICD-10-CM | POA: Diagnosis not present

## 2018-10-09 DIAGNOSIS — S0181XA Laceration without foreign body of other part of head, initial encounter: Secondary | ICD-10-CM | POA: Diagnosis not present

## 2018-10-09 DIAGNOSIS — I5022 Chronic systolic (congestive) heart failure: Secondary | ICD-10-CM | POA: Diagnosis not present

## 2018-10-09 DIAGNOSIS — Y92003 Bedroom of unspecified non-institutional (private) residence as the place of occurrence of the external cause: Secondary | ICD-10-CM | POA: Diagnosis not present

## 2018-10-09 DIAGNOSIS — M25512 Pain in left shoulder: Secondary | ICD-10-CM | POA: Diagnosis not present

## 2018-10-09 DIAGNOSIS — R58 Hemorrhage, not elsewhere classified: Secondary | ICD-10-CM | POA: Diagnosis not present

## 2018-10-09 DIAGNOSIS — Z8546 Personal history of malignant neoplasm of prostate: Secondary | ICD-10-CM | POA: Diagnosis not present

## 2018-10-09 DIAGNOSIS — R000 Tachycardia, unspecified: Secondary | ICD-10-CM | POA: Diagnosis not present

## 2018-10-09 DIAGNOSIS — R2689 Other abnormalities of gait and mobility: Secondary | ICD-10-CM | POA: Diagnosis not present

## 2018-10-09 DIAGNOSIS — J45909 Unspecified asthma, uncomplicated: Secondary | ICD-10-CM | POA: Diagnosis not present

## 2018-10-09 DIAGNOSIS — F0390 Unspecified dementia without behavioral disturbance: Secondary | ICD-10-CM | POA: Diagnosis not present

## 2018-10-09 DIAGNOSIS — Z79899 Other long term (current) drug therapy: Secondary | ICD-10-CM | POA: Diagnosis not present

## 2018-10-09 DIAGNOSIS — M25552 Pain in left hip: Secondary | ICD-10-CM | POA: Diagnosis not present

## 2018-10-09 DIAGNOSIS — S0101XA Laceration without foreign body of scalp, initial encounter: Secondary | ICD-10-CM | POA: Diagnosis not present

## 2018-10-09 DIAGNOSIS — I491 Atrial premature depolarization: Secondary | ICD-10-CM | POA: Diagnosis not present

## 2018-10-09 DIAGNOSIS — W06XXXA Fall from bed, initial encounter: Secondary | ICD-10-CM | POA: Diagnosis not present

## 2018-10-09 DIAGNOSIS — N3946 Mixed incontinence: Secondary | ICD-10-CM | POA: Diagnosis not present

## 2018-10-09 DIAGNOSIS — S4992XA Unspecified injury of left shoulder and upper arm, initial encounter: Secondary | ICD-10-CM | POA: Diagnosis not present

## 2018-10-09 NOTE — ED Triage Notes (Signed)
Per EMS. Pt from [facility] (full code - stressed from facilty.)  Pt fell/rolled out of bed, on plavix, two two inch lacerations on forehead, bleeding controlled.  Skin tear on right arm.  Pt hx of dementia, on full precautions.  PICC line.

## 2018-10-09 NOTE — Telephone Encounter (Signed)
Wife updated with recommendation. Verbalized understanding. Appointment scheduled for 11/20/18 at 1130 with Dr. Loletha Grayer.

## 2018-10-09 NOTE — Telephone Encounter (Signed)
Ok to wait for 1-2 months

## 2018-10-10 ENCOUNTER — Emergency Department (HOSPITAL_COMMUNITY): Payer: Medicare Other

## 2018-10-10 DIAGNOSIS — S79912A Unspecified injury of left hip, initial encounter: Secondary | ICD-10-CM | POA: Diagnosis not present

## 2018-10-10 DIAGNOSIS — M25552 Pain in left hip: Secondary | ICD-10-CM | POA: Diagnosis not present

## 2018-10-10 DIAGNOSIS — S4992XA Unspecified injury of left shoulder and upper arm, initial encounter: Secondary | ICD-10-CM | POA: Diagnosis not present

## 2018-10-10 DIAGNOSIS — M25512 Pain in left shoulder: Secondary | ICD-10-CM | POA: Diagnosis not present

## 2018-10-10 DIAGNOSIS — S0181XA Laceration without foreign body of other part of head, initial encounter: Secondary | ICD-10-CM | POA: Diagnosis not present

## 2018-10-10 MED ORDER — LIDOCAINE-EPINEPHRINE (PF) 2 %-1:200000 IJ SOLN
20.0000 mL | Freq: Once | INTRAMUSCULAR | Status: AC
  Administered 2018-10-10: 20 mL via INTRADERMAL
  Filled 2018-10-10: qty 20

## 2018-10-10 NOTE — ED Provider Notes (Signed)
[facility] EMERGENCY DEPARTMENT Provider Note   CSN: [id number] Arrival date & time: 10/09/18  2238     History   Chief Complaint Chief Complaint  Patient presents with  . Fall  . Laceration    HPI William [name] is a 84 y.o. male.  HPI Patient presents to the emergency department with injuries following a fall that occurred prior to arrival.  The patient was in bed when he rolled out and hit the floor.  This was witnessed by a staff member who states he did not lose consciousness.  Patient has a skin tear to the left arm.  He also has a serration to the mid forehead.  The patient has no other complaints at this time. Past Medical History:  Diagnosis Date  . AAA (abdominal aortic aneurysm) ([place])    7/13 3.8cm  . Arthritis    "joints" (01/11/2014)  . Asthma    "seasonal; some foods"   . Chronic systolic CHF (congestive heart failure) ([place])   . Dementia ([place])   . HLD (hyperlipidemia)   . HOH (hard of hearing)   . Lumbar vertebral fracture ([facility])    L1- 04/11/2014   . Prostate cancer ([place])   . S/P ICD (internal cardiac defibrillator) procedure, 01/11/14 removal of ERI gen and placement of Medtronic Evera XT VR & NEW Right ventricular lead Medtronic 01/12/2014  . S/P TAVR (transcatheter aortic valve replacement)    Edwards Sapien 3 THV (size 29 mm, model # [id number], serial # [id number])  . Severe aortic stenosis   . Sleep apnea    "lost 60# & don't have it anymore" (01/11/2014)  . Ventricular tachycardia [facility])     Patient Active Problem List   Diagnosis Date Noted  . Coronary artery disease involving native coronary artery of native heart without angina pectoris 09/22/2018  . Closed fracture of lower end of right ulna 09/22/2018  . Weakness acquired in ICU 09/22/2018  . Acute bacterial endocarditis   . 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 Bacteremia due to Klebsiella pneumoniae   . Sepsis ([place]) 09/04/2018  . S/P TAVR (transcatheter aortic valve  replacement) 08/18/2018  . Ventricular tachycardia ([place])   . Dementia ([place])   . Chronic systolic CHF (congestive heart failure) ([place]) 08/07/2018  . Severe aortic stenosis   . Acute on chronic systolic heart failure ([place]) 07/02/2018  . Prostate cancer ([place]) 01/20/2014  . S/P ICD (internal cardiac defibrillator) procedure, 01/11/14 removal of ERI gen and placement of Medtronic Evera XT VR & NEW Right ventricular lead Medtronic 01/12/2014  . Nonischemic cardiomyopathy ([place]) 05/28/2013  . Hyperlipidemia 05/28/2013  . AAA (abdominal aortic aneurysm) ([place]) 05/28/2013    Past Surgical History:  Procedure Laterality Date  . CARDIAC CATHETERIZATION  08/20/2004   noncritical CAD,mild global hypokinesis, EF 50%  . CARDIAC DEFIBRILLATOR PLACEMENT  08/23/2004   Medtronic  . CATARACT EXTRACTION W/ INTRAOCULAR LENS IMPLANT Right   . COLECTOMY  1990's  . CRYOABLATION N/A 02/24/2014   Procedure: CRYO ABLATION PROSTATE;  Surgeon: Ailene Rud, MD;  Location: [facility] ORS;  Service: Urology;  Laterality: N/A;  . HERNIA REPAIR     "abdomen; from colon OR"  . ICD LEAD REMOVAL N/A 09/15/2018   Procedure: ICD LEAD REMOVAL EXTRACTION;  Surgeon: Evans Lance, MD;  Location[facility];  Service: Cardiovascular;  Laterality: N/A;  DR. BARTLE TO BACK UP  . IMPLANTABLE CARDIOVERTER DEFIBRILLATOR (ICD) GENERATOR CHANGE N/A 01/11/2014   Procedure: ICD GENERATOR CHANGE;  Surgeon: Dani Gobble  Croitoru, MD;  Location: [facility] CATH LAB;  Service: Cardiovascular;  Laterality: N/A;  . LEAD REVISION N/A 01/11/2014   Procedure: LEAD REVISION;  Surgeon: Sanda Klein, MD;  Location: [facility] CATH LAB;  Service: Cardiovascular;  Laterality: N/A;  . NM MYOCAR PERF WALL MOTION  01/29/2012   abnormal c/o infarct/scar,no ischemia present  . PROSTATE BIOPSY N/A 11/22/2013   Procedure: PROSTATE BIOPSY AND ULTRASOUND;  Surgeon: Ailene Rud, MD;  Location: [facility] ORS;  Service: Urology;  Laterality: N/A;  . RIGHT/LEFT HEART CATH AND CORONARY  ANGIOGRAPHY N/A 07/06/2018   Procedure: RIGHT/LEFT HEART CATH AND CORONARY ANGIOGRAPHY;  Surgeon: Troy Sine, MD;  Location: [facility] CV LAB;  Service: Cardiovascular;  Laterality: N/A;  . TEE WITHOUT CARDIOVERSION N/A 08/18/2018   Procedure: TRANSESOPHAGEAL ECHOCARDIOGRAM (TEE);  Surgeon: Burnell Blanks, MD;  Location: [facility];  Service: Open Heart Surgery;  Laterality: N/A;  . TEE WITHOUT CARDIOVERSION N/A 09/08/2018   Procedure: TRANSESOPHAGEAL ECHOCARDIOGRAM (TEE);  Surgeon: Lelon Perla, MD;  Location: [facility] ENDOSCOPY;  Service: Cardiovascular;  Laterality: N/A;  . TEE WITHOUT CARDIOVERSION N/A 09/15/2018   Procedure: TRANSESOPHAGEAL ECHOCARDIOGRAM (TEE);  Surgeon: Evans Lance, MD;  Location: [facility];  Service: Cardiovascular;  Laterality: N/A;  . TRANSCATHETER AORTIC VALVE REPLACEMENT, TRANSFEMORAL  08/18/2018  . TRANSCATHETER AORTIC VALVE REPLACEMENT, TRANSFEMORAL N/A 08/18/2018   Procedure: TRANSCATHETER AORTIC VALVE REPLACEMENT, TRANSFEMORAL using an Edwards 28m Aortic Valve;  Surgeon: MBurnell Blanks MD;  Location: [facility]  Service: Open Heart Surgery;  Laterality: N/A;        Home Medications    Prior to Admission medications   Medication Sig Start Date End Date Taking? Authorizing Provider  acetaminophen (TYLENOL) 500 MG tablet Take 500 mg by mouth every 6 (six) hours as needed (for pain).    Yes [provider]  amiodarone (PACERONE) 200 MG tablet TAKE 1/2 TABLET BY MOUTH EVERY DAY Patient taking differently: Take 100 mg by mouth daily.  08/14/18  Yes Croitoru, Mihai, MD  cetirizine (ZYRTEC) 10 MG tablet Take 10 mg by mouth daily as needed for allergies.   Yes [provider]  cholecalciferol (VITAMIN D) 1000 units tablet Take 2,000 Units by mouth at bedtime.   Yes [provider]  clobetasol cream (TEMOVATE) 00.17% Apply 1 application topically 2 (two) times daily. To coccyx area and Right and Left inner buttocks   Yes [provider]  ertapenem (INVANZ) IVPB Inject 1 g into the vein daily. Indication:  ESBL Klebsiella endocarditis  Last Day of Therapy:  10/27/18 Labs - Once weekly:  CBC/D and BMP, Labs - Every other week:  ESR and CRP 09/18/18 10/30/18 Yes Samtani, JDarylene Price MD  ezetimibe-simvastatin (VYTORIN) 10-20 MG per tablet Take 1 tablet by mouth at bedtime.   Yes [provider]  metoprolol succinate (TOPROL-XL) 25 MG 24 hr tablet Take 1 tablet (25 mg total) by mouth daily. Take with or immediately following a meal. 09/18/18  Yes SNita Sells MD  Multiple Vitamins-Minerals (PRESERVISION AREDS 2 PO) Take 1 capsule by mouth daily.    Yes [provider]  polyethylene glycol (MIRALAX / GLYCOLAX) packet Take 17 g by mouth every evening. 01/21/17  Yes Croitoru, Mihai, MD  aspirin 81 MG chewable tablet Chew 1 tablet (81 mg total) by mouth daily. Patient not taking: Reported on 10/10/2018 07/08/18   KAbigail Butts, PA-C  clopidogrel (PLAVIX) 75 MG tablet Take 1 tablet (75 mg total) by mouth daily with breakfast. Patient not  taking: Reported on 10/10/2018 08/21/18   Eileen Stanford, PA-C  senna (SENOKOT) 8.6 MG TABS tablet Take 1 tablet (8.6 mg total) by mouth daily. Patient not taking: Reported on 10/10/2018 09/18/18   Nita Sells, MD    Family History Family History  Problem Relation Age of Onset  . Heart failure Mother        Died of "old age" at 43  . Pneumonia Father     Social History Social History   Tobacco Use  . Smoking status: Never Smoker  . Smokeless tobacco: Never Used  Substance Use Topics  . Alcohol use: Yes    Alcohol/week: 0.0 standard drinks    Comment: 01/11/2014 "drink 1/2 of a beer twice per month   . Drug use: No     Allergies   Crestor [rosuvastatin]; Lipitor [atorvastatin]; and Shrimp [shellfish allergy]   Review of Systems Review of Systems All other systems negative except as documented in the HPI. All pertinent positives  and negatives as reviewed in the HPI.  Physical Exam Updated Vital Signs BP 106/65   Pulse 99   Temp 98.9 F (37.2 C) (Oral)   Resp 16   SpO2 99%   Physical Exam Vitals signs and nursing note reviewed.  Constitutional:      General: He is not in acute distress.    Appearance: He is well-developed.  HENT:     Head: Normocephalic. Laceration present.   Eyes:     Pupils: Pupils are equal, round, and reactive to light.  Neck:     Musculoskeletal: Normal range of motion and neck supple.  Cardiovascular:     Rate and Rhythm: Normal rate and regular rhythm.     Heart sounds: Normal heart sounds. No murmur. No friction rub. No gallop.   Pulmonary:     Effort: Pulmonary effort is normal. No respiratory distress.     Breath sounds: Normal breath sounds. No wheezing.  Abdominal:     General: Bowel sounds are normal. There is no distension.     Palpations: Abdomen is soft.     Tenderness: There is no abdominal tenderness.  Musculoskeletal:     Left hip: He exhibits tenderness. He exhibits normal range of motion, normal strength and no swelling.       Arms:  Skin:    General: Skin is warm and dry.     Capillary Refill: Capillary refill takes less than 2 seconds.     Findings: No erythema or rash.  Neurological:     General: No focal deficit present.     Mental Status: He is alert.     Motor: No abnormal muscle tone.     Coordination: Coordination normal.  Psychiatric:        Mood and Affect: Mood normal.        Behavior: Behavior normal.      ED Treatments / Results  Labs (all labs ordered are listed, but only abnormal results are displayed) Labs Reviewed - No data to display  EKG EKG Interpretation  Date/Time:  Saturday October 10 2018 02:26:11 EST Ventricular Rate:  96 PR Interval:    QRS Duration: 133 QT Interval:  392 QTC Calculation: 496 R Axis:   -63 Text Interpretation:  Sinus rhythm Multiform ventricular premature complexes Left bundle branch block  similar to Nov 2019 Confirmed by Sherwood Gambler [phone]) on 10/10/2018 2:40:35 AM   Radiology Ct Head Wo Contrast  Result Date: 10/09/2018 CLINICAL DATA:  Fall while on anti coagulation therapy.  EXAM: CT HEAD WITHOUT CONTRAST CT CERVICAL SPINE WITHOUT CONTRAST TECHNIQUE: Multidetector CT imaging of the head and cervical spine was performed following the standard protocol without intravenous contrast. Multiplanar CT image reconstructions of the cervical spine were also generated. COMPARISON:  Head CT 04/12/2013 FINDINGS: CT HEAD FINDINGS Brain: There is no mass, hemorrhage or extra-axial collection. There is generalized atrophy without lobar predilection. There is hypoattenuation of the periventricular white matter, most commonly indicating chronic ischemic microangiopathy. Vascular: Atherosclerotic calcification of the vertebral and internal carotid arteries at the skull base. No abnormal hyperdensity of the major intracranial arteries or dural venous sinuses. There is dolichoectasia of the basilar artery, unchanged. Skull: Left frontal scalp laceration.  No skull fracture. Sinuses/Orbits: No fluid levels or advanced mucosal thickening of the visualized paranasal sinuses. No mastoid or middle ear effusion. The orbits are normal. CT CERVICAL SPINE FINDINGS Alignment: No static subluxation. Facets are aligned. Occipital condyles are normally positioned. Skull base and vertebrae: No acute fracture. Soft tissues and spinal canal: No prevertebral fluid or swelling. No visible canal hematoma. Disc levels: Facet hypertrophy is worst at right C2-3 and left C3-4. No bony spinal canal stenosis. Upper chest: No pneumothorax, pulmonary nodule or pleural effusion. Other: Normal visualized paraspinal cervical soft tissues. IMPRESSION: 1. No acute intracranial abnormality. 2. Left frontal scalp laceration without skull fracture. 3. No acute fracture or static subluxation of the cervical spine. 4. Chronic ischemic  microangiopathy and basilar dolichoectasia. Electronically Signed   By: Ulyses Jarred M.D.   On: 10/09/2018 23:50   Ct Cervical Spine Wo Contrast  Result Date: 10/09/2018 CLINICAL DATA:  Fall while on anti coagulation therapy. EXAM: CT HEAD WITHOUT CONTRAST CT CERVICAL SPINE WITHOUT CONTRAST TECHNIQUE: Multidetector CT imaging of the head and cervical spine was performed following the standard protocol without intravenous contrast. Multiplanar CT image reconstructions of the cervical spine were also generated. COMPARISON:  Head CT 04/12/2013 FINDINGS: CT HEAD FINDINGS Brain: There is no mass, hemorrhage or extra-axial collection. There is generalized atrophy without lobar predilection. There is hypoattenuation of the periventricular white matter, most commonly indicating chronic ischemic microangiopathy. Vascular: Atherosclerotic calcification of the vertebral and internal carotid arteries at the skull base. No abnormal hyperdensity of the major intracranial arteries or dural venous sinuses. There is dolichoectasia of the basilar artery, unchanged. Skull: Left frontal scalp laceration.  No skull fracture. Sinuses/Orbits: No fluid levels or advanced mucosal thickening of the visualized paranasal sinuses. No mastoid or middle ear effusion. The orbits are normal. CT CERVICAL SPINE FINDINGS Alignment: No static subluxation. Facets are aligned. Occipital condyles are normally positioned. Skull base and vertebrae: No acute fracture. Soft tissues and spinal canal: No prevertebral fluid or swelling. No visible canal hematoma. Disc levels: Facet hypertrophy is worst at right C2-3 and left C3-4. No bony spinal canal stenosis. Upper chest: No pneumothorax, pulmonary nodule or pleural effusion. Other: Normal visualized paraspinal cervical soft tissues. IMPRESSION: 1. No acute intracranial abnormality. 2. Left frontal scalp laceration without skull fracture. 3. No acute fracture or static subluxation of the cervical spine.  4. Chronic ischemic microangiopathy and basilar dolichoectasia. Electronically Signed   By: Ulyses Jarred M.D.   On: 10/09/2018 23:50   Dg Shoulder Left  Result Date: 10/10/2018 CLINICAL DATA:  Status post fall with left shoulder pain. EXAM: LEFT SHOULDER - 2+ VIEW COMPARISON:  None. FINDINGS: There is no evidence of fracture or dislocation. Degenerative joint changes of the left shoulder noted. Soft tissues are unremarkable. IMPRESSION: No acute fracture or dislocation. Electronically  Signed   By: Abelardo Diesel M.D.   On: 10/10/2018 01:46   Dg Hip Unilat W Or Wo Pelvis 2-3 Views Left  Result Date: 10/10/2018 CLINICAL DATA:  Status post fall with left hip pain. EXAM: DG HIP (WITH OR WITHOUT PELVIS) 2-3V LEFT COMPARISON:  None. FINDINGS: There is no evidence of hip fracture or dislocation. Degenerative joint changes of bilateral hips with narrowed joint space and osteophyte formation are noted. IMPRESSION: No acute fracture or dislocation. Electronically Signed   By: Abelardo Diesel M.D.   On: 10/10/2018 01:47    Procedures Procedures (including critical care time)  Medications Ordered in ED Medications  lidocaine-EPINEPHrine (XYLOCAINE W/EPI) 2 %-1:200000 (PF) injection 20 mL (has no administration in time range)     Initial Impression / Assessment and Plan / ED Course  I have reviewed the triage vital signs and the nursing notes.  Pertinent labs & imaging results that were available during my care of the patient were reviewed by me and considered in my medical decision making (see chart for details).    LACERATION REPAIR Performed by: [facility] Authorized by: Resa Miner  Consent: Verbal consent obtained. Risks and benefits: risks, benefits and alternatives were discussed Consent given by: patient Patient identity confirmed: provided demographic data Prepped and Draped in normal sterile fashion Wound explored  Laceration Location: Mid forehead  Laceration  Length: 4 x 3 cm  No Foreign Bodies seen or palpated  Anesthesia: local infiltration  Local anesthetic: lidocaine 2 % with epinephrine  Anesthetic total: 5 ml  Irrigation method: syringe Amount of cleaning: standard  Skin closure: 6-0 Vicryl and 6-0 Prolene  Number of sutures: 7 subcutaneous sutures and 17 simple interrupted  Technique: Simple interrupted  Patient tolerance: Patient tolerated the procedure well with no immediate complications.  Family is given explicit wound care instructions.  The patient has negative imaging that was reviewed by me.  The patients daughter were advised to have sutures out in 5 days.  Keep the area clean and dry.  Given return precautions along with return precautions for any signs of infection of the wound.  Final Clinical Impressions(s) / ED Diagnoses   Final diagnoses:  None    ED Discharge Orders    None       Dalia Heading, PA-C 10/10/18 Gayland Curry, MD 10/10/18 [phone]

## 2018-10-10 NOTE — ED Notes (Signed)
Report given to [facility] crew and Dawn, Therapist, sports at [facility], despite difficulty in reaching individual appropriate to receive report at facility. Pt departed in NAD, and in care of [facility] crew.

## 2018-10-10 NOTE — ED Notes (Signed)
Called [facility] 

## 2018-10-10 NOTE — Discharge Instructions (Addendum)
Return here as needed for any worsening in his condition.  Follow-up with his primary doctor for a recheck and suture removal.  Keep the area clean and dry gently clean with soap and water.  Tylenol for any pain.

## 2018-10-12 DIAGNOSIS — M62562 Muscle wasting and atrophy, not elsewhere classified, left lower leg: Secondary | ICD-10-CM | POA: Diagnosis not present

## 2018-10-12 DIAGNOSIS — N3946 Mixed incontinence: Secondary | ICD-10-CM | POA: Diagnosis not present

## 2018-10-12 DIAGNOSIS — T827XXA Infection and inflammatory reaction due to other cardiac and vascular devices, implants and grafts, initial encounter: Secondary | ICD-10-CM | POA: Diagnosis not present

## 2018-10-12 DIAGNOSIS — M25511 Pain in right shoulder: Secondary | ICD-10-CM | POA: Diagnosis not present

## 2018-10-12 DIAGNOSIS — M6389 Disorders of muscle in diseases classified elsewhere, multiple sites: Secondary | ICD-10-CM | POA: Diagnosis not present

## 2018-10-12 DIAGNOSIS — R2689 Other abnormalities of gait and mobility: Secondary | ICD-10-CM | POA: Diagnosis not present

## 2018-10-12 DIAGNOSIS — M62561 Muscle wasting and atrophy, not elsewhere classified, right lower leg: Secondary | ICD-10-CM | POA: Diagnosis not present

## 2018-10-12 DIAGNOSIS — D649 Anemia, unspecified: Secondary | ICD-10-CM | POA: Diagnosis not present

## 2018-10-13 ENCOUNTER — Encounter

## 2018-10-13 ENCOUNTER — Ambulatory Visit: Payer: Medicare Other | Admitting: Cardiovascular Disease

## 2018-10-13 DIAGNOSIS — N3946 Mixed incontinence: Secondary | ICD-10-CM | POA: Diagnosis not present

## 2018-10-13 DIAGNOSIS — M62561 Muscle wasting and atrophy, not elsewhere classified, right lower leg: Secondary | ICD-10-CM | POA: Diagnosis not present

## 2018-10-13 DIAGNOSIS — M62562 Muscle wasting and atrophy, not elsewhere classified, left lower leg: Secondary | ICD-10-CM | POA: Diagnosis not present

## 2018-10-13 DIAGNOSIS — M25511 Pain in right shoulder: Secondary | ICD-10-CM | POA: Diagnosis not present

## 2018-10-13 DIAGNOSIS — M6389 Disorders of muscle in diseases classified elsewhere, multiple sites: Secondary | ICD-10-CM | POA: Diagnosis not present

## 2018-10-13 DIAGNOSIS — R2689 Other abnormalities of gait and mobility: Secondary | ICD-10-CM | POA: Diagnosis not present

## 2018-10-15 ENCOUNTER — Encounter: Payer: Self-pay | Admitting: Adult Health

## 2018-10-15 ENCOUNTER — Non-Acute Institutional Stay (SKILLED_NURSING_FACILITY): Payer: Medicare Other | Admitting: Adult Health

## 2018-10-15 DIAGNOSIS — I5023 Acute on chronic systolic (congestive) heart failure: Secondary | ICD-10-CM | POA: Diagnosis not present

## 2018-10-15 DIAGNOSIS — A419 Sepsis, unspecified organism: Secondary | ICD-10-CM | POA: Diagnosis not present

## 2018-10-15 DIAGNOSIS — R2689 Other abnormalities of gait and mobility: Secondary | ICD-10-CM | POA: Diagnosis not present

## 2018-10-15 DIAGNOSIS — M25511 Pain in right shoulder: Secondary | ICD-10-CM | POA: Diagnosis not present

## 2018-10-15 DIAGNOSIS — I350 Nonrheumatic aortic (valve) stenosis: Secondary | ICD-10-CM | POA: Diagnosis not present

## 2018-10-15 DIAGNOSIS — S0181XD Laceration without foreign body of other part of head, subsequent encounter: Secondary | ICD-10-CM | POA: Diagnosis not present

## 2018-10-15 DIAGNOSIS — M62561 Muscle wasting and atrophy, not elsewhere classified, right lower leg: Secondary | ICD-10-CM | POA: Diagnosis not present

## 2018-10-15 DIAGNOSIS — I351 Nonrheumatic aortic (valve) insufficiency: Secondary | ICD-10-CM | POA: Diagnosis not present

## 2018-10-15 DIAGNOSIS — I330 Acute and subacute infective endocarditis: Secondary | ICD-10-CM | POA: Diagnosis not present

## 2018-10-15 DIAGNOSIS — T827XXS Infection and inflammatory reaction due to other cardiac and vascular devices, implants and grafts, sequela: Secondary | ICD-10-CM | POA: Diagnosis not present

## 2018-10-15 DIAGNOSIS — M62562 Muscle wasting and atrophy, not elsewhere classified, left lower leg: Secondary | ICD-10-CM | POA: Diagnosis not present

## 2018-10-15 DIAGNOSIS — R7881 Bacteremia: Secondary | ICD-10-CM | POA: Diagnosis not present

## 2018-10-15 DIAGNOSIS — R601 Generalized edema: Secondary | ICD-10-CM | POA: Diagnosis not present

## 2018-10-15 DIAGNOSIS — R278 Other lack of coordination: Secondary | ICD-10-CM | POA: Diagnosis not present

## 2018-10-15 DIAGNOSIS — Z952 Presence of prosthetic heart valve: Secondary | ICD-10-CM | POA: Diagnosis not present

## 2018-10-15 DIAGNOSIS — M6389 Disorders of muscle in diseases classified elsewhere, multiple sites: Secondary | ICD-10-CM | POA: Diagnosis not present

## 2018-10-15 DIAGNOSIS — F0390 Unspecified dementia without behavioral disturbance: Secondary | ICD-10-CM | POA: Diagnosis not present

## 2018-10-15 DIAGNOSIS — N3946 Mixed incontinence: Secondary | ICD-10-CM | POA: Diagnosis not present

## 2018-10-15 NOTE — Progress Notes (Signed)
Location:  Occupational psychologist of Service:  SNF (31) Provider:   Cindi Carbon, [place] [phone]   Tisovec, Fransico Him, MD  Patient Care Team: Tisovec, Fransico Him, MD as PCP - General (Internal Medicine) Sanda Klein, MD as PCP - Cardiology (Cardiology) Carolan Clines, MD as Consulting Physician (Urology)  Extended Emergency Contact Information Primary Emergency Contact: [facility] Address: [address]          [place], [place] [address] [name] of [place] Phone: [phone] Mobile Phone: [phone] Relation: Spouse Secondary Emergency Contact: Michail Sermon, [place] of [place] Phone: [phone] Mobile Phone: [phone] Relation: Daughter  Code Status:  Full code Goals of care: Advanced Directive information Advanced Directives 09/05/2018  Does Patient Have a Medical Advance Directive? No  Type of Advance Directive -  Does patient want to make changes to medical advance directive? -  Copy of [place] in Chart? -  Would patient like information on creating a medical advance directive? No - Patient declined  Pre-existing out of facility DNR order (yellow form or pink MOST form) -     Chief Complaint  Patient presents with  . Acute Visit    wound check    HPI:  Pt is a 85 y.o. male seen today for an acute visit for a wound check. He fell and hit his head on 10/09/18 and sustained a laceration. He went to the emergency room where he received sutures in a horizontal and vertical pattern as he had wounds in both directions. He has not had any pain, redness, or drainage noted. 7 subq sutures and 17 simple interrupted sutures were placed with directions to remove after 5 days.    Past Medical History:  Diagnosis Date  . AAA (abdominal aortic aneurysm) ([place])    7/13 3.8cm  . Arthritis    "joints" (01/11/2014)  . Asthma    "seasonal; some foods"    . Chronic systolic CHF (congestive heart failure) ([place])   . Dementia ([place])   . HLD (hyperlipidemia)   . HOH (hard of hearing)   . Lumbar vertebral fracture ([facility])    L1- 04/11/2014   . Prostate cancer ([place])   . S/P ICD (internal cardiac defibrillator) procedure, 01/11/14 removal of ERI gen and placement of Medtronic Evera XT VR & NEW Right ventricular lead Medtronic 01/12/2014  . S/P TAVR (transcatheter aortic valve replacement)    Edwards Sapien 3 THV (size 29 mm, model # [id number], serial # [id number])  . Severe aortic stenosis   . Sleep apnea    "lost 60# & don't have it anymore" (01/11/2014)  . Ventricular tachycardia [facility])    Past Surgical History:  Procedure Laterality Date  . CARDIAC CATHETERIZATION  08/20/2004   noncritical CAD,mild global hypokinesis, EF 50%  . CARDIAC DEFIBRILLATOR PLACEMENT  08/23/2004   Medtronic  . CATARACT EXTRACTION W/ INTRAOCULAR LENS IMPLANT Right   . COLECTOMY  1990's  . CRYOABLATION N/A 02/24/2014   Procedure: CRYO ABLATION PROSTATE;  Surgeon: Ailene Rud, MD;  Location: [facility] ORS;  Service: Urology;  Laterality: N/A;  . HERNIA REPAIR     "abdomen; from colon OR"  . ICD LEAD REMOVAL N/A 09/15/2018   Procedure: ICD LEAD REMOVAL EXTRACTION;  Surgeon: Evans Lance, MD;  Location: [facility] OR;  Service: Cardiovascular;  Laterality: N/A;  DR. BARTLE TO BACK UP  . IMPLANTABLE CARDIOVERTER DEFIBRILLATOR (ICD) GENERATOR CHANGE  N/A 01/11/2014   Procedure: ICD GENERATOR CHANGE;  Surgeon: Sanda Klein, MD;  Location: [facility] CATH LAB;  Service: Cardiovascular;  Laterality: N/A;  . LEAD REVISION N/A 01/11/2014   Procedure: LEAD REVISION;  Surgeon: Sanda Klein, MD;  Location: [place] CATH LAB;  Service: Cardiovascular;  Laterality: N/A;  . NM MYOCAR PERF WALL MOTION  01/29/2012   abnormal c/o infarct/scar,no ischemia present  . PROSTATE BIOPSY N/A 11/22/2013   Procedure: PROSTATE BIOPSY AND ULTRASOUND;  Surgeon: Ailene Rud, MD;  Location: [facility] ORS;  Service:  Urology;  Laterality: N/A;  . RIGHT/LEFT HEART CATH AND CORONARY ANGIOGRAPHY N/A 07/06/2018   Procedure: RIGHT/LEFT HEART CATH AND CORONARY ANGIOGRAPHY;  Surgeon: Troy Sine, MD;  Location: [place] CV LAB;  Service: Cardiovascular;  Laterality: N/A;  . TEE WITHOUT CARDIOVERSION N/A 08/18/2018   Procedure: TRANSESOPHAGEAL ECHOCARDIOGRAM (TEE);  Surgeon: Burnell Blanks, MD;  Location: [place];  Service: Open Heart Surgery;  Laterality: N/A;  . TEE WITHOUT CARDIOVERSION N/A 09/08/2018   Procedure: TRANSESOPHAGEAL ECHOCARDIOGRAM (TEE);  Surgeon: Lelon Perla, MD;  Location: [facility] ENDOSCOPY;  Service: Cardiovascular;  Laterality: N/A;  . TEE WITHOUT CARDIOVERSION N/A 09/15/2018   Procedure: TRANSESOPHAGEAL ECHOCARDIOGRAM (TEE);  Surgeon: Evans Lance, MD;  Location: [place];  Service: Cardiovascular;  Laterality: N/A;  . TRANSCATHETER AORTIC VALVE REPLACEMENT, TRANSFEMORAL  08/18/2018  . TRANSCATHETER AORTIC VALVE REPLACEMENT, TRANSFEMORAL N/A 08/18/2018   Procedure: TRANSCATHETER AORTIC VALVE REPLACEMENT, TRANSFEMORAL using an Edwards 55m Aortic Valve;  Surgeon: MBurnell Blanks MD;  Location[place]  Service: Open Heart Surgery;  Laterality: N/A;    Allergies  Allergen Reactions  . Crestor [Rosuvastatin] Other (See Comments)    Hurts muscles  . Lipitor [Atorvastatin] Other (See Comments)    Hurts stomach  . Shrimp [Shellfish Allergy] Other (See Comments)    On MAR    Outpatient Encounter Medications as of 10/15/2018  Medication Sig  . acetaminophen (TYLENOL) 500 MG tablet Take 500 mg by mouth every 6 (six) hours as needed (for pain).   .Marland Kitchenamiodarone (PACERONE) 200 MG tablet TAKE 1/2 TABLET BY MOUTH EVERY DAY (Patient taking differently: Take 100 mg by mouth daily. )  . aspirin 81 MG chewable tablet Chew 1 tablet (81 mg total) by mouth daily. (Patient not taking: Reported on 10/10/2018)  . cetirizine (ZYRTEC) 10 MG tablet Take 10 mg by mouth daily as needed for  allergies.  . cholecalciferol (VITAMIN D) 1000 units tablet Take 2,000 Units by mouth at bedtime.  . clobetasol cream (TEMOVATE) 06.44% Apply 1 application topically 2 (two) times daily. To coccyx area and Right and Left inner buttocks  . clopidogrel (PLAVIX) 75 MG tablet Take 1 tablet (75 mg total) by mouth daily with breakfast. (Patient not taking: Reported on 10/10/2018)  . ertapenem (INVANZ) IVPB Inject 1 g into the vein daily. Indication:  ESBL Klebsiella endocarditis  Last Day of Therapy:  10/27/18 Labs - Once weekly:  CBC/D and BMP, Labs - Every other week:  ESR and CRP  . ezetimibe-simvastatin (VYTORIN) 10-20 MG per tablet Take 1 tablet by mouth at bedtime.  . metoprolol succinate (TOPROL-XL) 25 MG 24 hr tablet Take 1 tablet (25 mg total) by mouth daily. Take with or immediately following a meal.  . Multiple Vitamins-Minerals (PRESERVISION AREDS 2 PO) Take 1 capsule by mouth daily.   . polyethylene glycol (MIRALAX / GLYCOLAX) packet Take 17 g by mouth every evening.  . senna (SENOKOT) 8.6 MG TABS tablet Take 1 tablet (  8.6 mg total) by mouth daily. (Patient not taking: Reported on 10/10/2018)   No facility-administered encounter medications on file as of 10/15/2018.     Review of Systems  Constitutional: Negative for chills and fever.  Skin: Positive for wound. Negative for color change, pallor and rash.    Immunization History  Administered Date(s) Administered  . Influenza, High Dose Seasonal PF 08/13/2017, 07/03/2018  . Influenza,inj,Quad PF,6+ Mos 08/18/2013, 07/23/2016  . Pneumococcal Conjugate-13 08/14/2013   Pertinent  Health Maintenance Due  Topic Date Due  . PNA vac Low Risk Adult (2 of 2 - PPSV23) 08/14/2014  . INFLUENZA VACCINE  Completed   No flowsheet data found. Functional Status Survey:    Vitals:   10/15/18 1639  BP: 104/68  Pulse: 97  Resp: 16  Temp: 97.8 F (36.6 C)  SpO2: 97%   There is no height or weight on file to calculate BMI. Physical  Exam Vitals signs and nursing note reviewed.  Constitutional:      General: He is not in acute distress. Skin:    General: Skin is warm and dry.     Comments: Forehead laceration horizontal and lateral areas are well approximated with sutures without drainage, redness, or swelling.   Neurological:     Mental Status: He is alert.     Labs reviewed: Recent Labs    08/07/18 0512 08/07/18 1411  09/08/18 0719 09/09/18 0826  09/15/18 0343  09/17/18 0332 09/18/18 0850 09/21/18 0413 10/05/18 0500  NA 134* 138   < > 135 132*   < > 133*   < > 132* 135 137 137  K 3.6 3.2*   < > 3.5 3.7   < > 3.7   < > 3.6 3.5 3.6 4.2  CL 100 107   < > 105 103   < > 101   < > 99 102 103  --   CO2 26 25   < > 23 21*   < > 24   < > _0 --   GLUCOSE 164* 102*   < > 129* 126*   < > 155*   < > 121* 171* 130*  --   BUN 16 12   < > 12 13   < > 17   < > _1 CREATININE 1.30* 1.16   < > 0.95 0.92   < > 1.01   < > 0.77 0.88 0.81 0.8  CALCIUM 8.8* 8.2*   < > 8.1* 8.2*   < > 8.1*   < > 7.7* 7.8* 7.8*  --   MG 2.2 2.2   < > 2.3 2.2  --  2.4  --   --   --   --   --   PHOS 3.6 3.3  --   --   --   --   --   --   --   --   --   --    < > = values in this interval not displayed.   Recent Labs    09/07/18 0303 09/08/18 0719 09/09/18 0826  AST 34 28 26  ALT 66* 47* 42  ALKPHOS 203* 175* 155*  BILITOT 1.2 1.2 1.4*  PROT 5.7* 5.7* 5.6*  ALBUMIN 2.5* 2.4* 2.5*   Recent Labs    09/17/18 0332 09/18/18 0850 09/21/18 0413 10/05/18 0500  WBC 6.1 6.3 5.5 8.2  NEUTROABS 4.0 4.6 2.9  --   HGB 9.8* 9.9* 9.7* 11.7*  HCT 28.1* 29.0* 28.5* 33*  MCV 94.9 96.3 97.3  --   PLT 131* 129* 144* 158   Lab Results  Component Value Date   TSH 1.75 02/05/2017   Lab Results  Component Value Date   HGBA1C 5.2 08/13/2018   No results found for: CHOL, HDL, LDLCALC, LDLDIRECT, TRIG, CHOLHDL  Significant Diagnostic Results in last 30 days:  Dg Chest 2 View  Result Date: 09/16/2018 CLINICAL DATA:  ICD  infection. EXAM: CHEST - 2 VIEW COMPARISON:  09/15/2018 FINDINGS: Cardiac enlargement without heart failure. TAVR unchanged in position. Negative for heart failure or edema. Atherosclerotic aortic arch. Mild bibasilar atelectasis unchanged. IMPRESSION: Mild bibasilar atelectasis unchanged.  Negative for edema. Electronically Signed   By: Franchot Gallo M.D.   On: 09/16/2018 08:57   Ct Head Wo Contrast  Result Date: 10/09/2018 CLINICAL DATA:  Fall while on anti coagulation therapy. EXAM: CT HEAD WITHOUT CONTRAST CT CERVICAL SPINE WITHOUT CONTRAST TECHNIQUE: Multidetector CT imaging of the head and cervical spine was performed following the standard protocol without intravenous contrast. Multiplanar CT image reconstructions of the cervical spine were also generated. COMPARISON:  Head CT 04/12/2013 FINDINGS: CT HEAD FINDINGS Brain: There is no mass, hemorrhage or extra-axial collection. There is generalized atrophy without lobar predilection. There is hypoattenuation of the periventricular white matter, most commonly indicating chronic ischemic microangiopathy. Vascular: Atherosclerotic calcification of the vertebral and internal carotid arteries at the skull base. No abnormal hyperdensity of the major intracranial arteries or dural venous sinuses. There is dolichoectasia of the basilar artery, unchanged. Skull: Left frontal scalp laceration.  No skull fracture. Sinuses/Orbits: No fluid levels or advanced mucosal thickening of the visualized paranasal sinuses. No mastoid or middle ear effusion. The orbits are normal. CT CERVICAL SPINE FINDINGS Alignment: No static subluxation. Facets are aligned. Occipital condyles are normally positioned. Skull base and vertebrae: No acute fracture. Soft tissues and spinal canal: No prevertebral fluid or swelling. No visible canal hematoma. Disc levels: Facet hypertrophy is worst at right C2-3 and left C3-4. No bony spinal canal stenosis. Upper chest: No pneumothorax, pulmonary  nodule or pleural effusion. Other: Normal visualized paraspinal cervical soft tissues. IMPRESSION: 1. No acute intracranial abnormality. 2. Left frontal scalp laceration without skull fracture. 3. No acute fracture or static subluxation of the cervical spine. 4. Chronic ischemic microangiopathy and basilar dolichoectasia. Electronically Signed   By: Ulyses Jarred M.D.   On: 10/09/2018 23:50   Ct Cervical Spine Wo Contrast  Result Date: 10/09/2018 CLINICAL DATA:  Fall while on anti coagulation therapy. EXAM: CT HEAD WITHOUT CONTRAST CT CERVICAL SPINE WITHOUT CONTRAST TECHNIQUE: Multidetector CT imaging of the head and cervical spine was performed following the standard protocol without intravenous contrast. Multiplanar CT image reconstructions of the cervical spine were also generated. COMPARISON:  Head CT 04/12/2013 FINDINGS: CT HEAD FINDINGS Brain: There is no mass, hemorrhage or extra-axial collection. There is generalized atrophy without lobar predilection. There is hypoattenuation of the periventricular white matter, most commonly indicating chronic ischemic microangiopathy. Vascular: Atherosclerotic calcification of the vertebral and internal carotid arteries at the skull base. No abnormal hyperdensity of the major intracranial arteries or dural venous sinuses. There is dolichoectasia of the basilar artery, unchanged. Skull: Left frontal scalp laceration.  No skull fracture. Sinuses/Orbits: No fluid levels or advanced mucosal thickening of the visualized paranasal sinuses. No mastoid or middle ear effusion. The orbits are normal. CT CERVICAL SPINE FINDINGS Alignment: No static subluxation. Facets are aligned. Occipital condyles are normally positioned. Skull base and vertebrae:  No acute fracture. Soft tissues and spinal canal: No prevertebral fluid or swelling. No visible canal hematoma. Disc levels: Facet hypertrophy is worst at right C2-3 and left C3-4. No bony spinal canal stenosis. Upper chest: No  pneumothorax, pulmonary nodule or pleural effusion. Other: Normal visualized paraspinal cervical soft tissues. IMPRESSION: 1. No acute intracranial abnormality. 2. Left frontal scalp laceration without skull fracture. 3. No acute fracture or static subluxation of the cervical spine. 4. Chronic ischemic microangiopathy and basilar dolichoectasia. Electronically Signed   By: Ulyses Jarred M.D.   On: 10/09/2018 23:50   Dg Chest Port 1 View  Result Date: 09/15/2018 CLINICAL DATA:  ICD lead removal.  Postop infection. EXAM: PORTABLE CHEST 1 VIEW COMPARISON:  09/04/2018 FINDINGS: Interval removal of left AICD. Prior aortic valve repair. No pneumothorax. Cardiomegaly. Bibasilar atelectasis. No visible effusions or overt edema. IMPRESSION: Interval removal of left AICD.  No pneumothorax. Cardiomegaly, bibasilar atelectasis. Electronically Signed   By: Rolm Baptise M.D.   On: 09/15/2018 21:05   Dg Shoulder Left  Result Date: 10/10/2018 CLINICAL DATA:  Status post fall with left shoulder pain. EXAM: LEFT SHOULDER - 2+ VIEW COMPARISON:  None. FINDINGS: There is no evidence of fracture or dislocation. Degenerative joint changes of the left shoulder noted. Soft tissues are unremarkable. IMPRESSION: No acute fracture or dislocation. Electronically Signed   By: Abelardo Diesel M.D.   On: 10/10/2018 01:46   Dg Hip Unilat W Or Wo Pelvis 2-3 Views Left  Result Date: 10/10/2018 CLINICAL DATA:  Status post fall with left hip pain. EXAM: DG HIP (WITH OR WITHOUT PELVIS) 2-3V LEFT COMPARISON:  None. FINDINGS: There is no evidence of hip fracture or dislocation. Degenerative joint changes of bilateral hips with narrowed joint space and osteophyte formation are noted. IMPRESSION: No acute fracture or dislocation. Electronically Signed   By: Abelardo Diesel M.D.   On: 10/10/2018 01:47   [place] Ekg Site Rite  Result Date: 09/16/2018 If Site Rite image not attached, placement could not be confirmed due to current cardiac  rhythm.   Assessment/Plan  1. Laceration of forehead, subsequent encounter Well approximated with no signs of infection May remove sutures and place streristrips    Family/ staff Communication: discussed with his nurse [name]  Labs/tests ordered:  NA

## 2018-10-16 DIAGNOSIS — M6389 Disorders of muscle in diseases classified elsewhere, multiple sites: Secondary | ICD-10-CM | POA: Diagnosis not present

## 2018-10-16 DIAGNOSIS — N3946 Mixed incontinence: Secondary | ICD-10-CM | POA: Diagnosis not present

## 2018-10-16 DIAGNOSIS — M25511 Pain in right shoulder: Secondary | ICD-10-CM | POA: Diagnosis not present

## 2018-10-16 DIAGNOSIS — R2689 Other abnormalities of gait and mobility: Secondary | ICD-10-CM | POA: Diagnosis not present

## 2018-10-16 DIAGNOSIS — M62561 Muscle wasting and atrophy, not elsewhere classified, right lower leg: Secondary | ICD-10-CM | POA: Diagnosis not present

## 2018-10-16 DIAGNOSIS — M62562 Muscle wasting and atrophy, not elsewhere classified, left lower leg: Secondary | ICD-10-CM | POA: Diagnosis not present

## 2018-10-19 DIAGNOSIS — E7800 Pure hypercholesterolemia, unspecified: Secondary | ICD-10-CM | POA: Diagnosis not present

## 2018-10-19 DIAGNOSIS — M62561 Muscle wasting and atrophy, not elsewhere classified, right lower leg: Secondary | ICD-10-CM | POA: Diagnosis not present

## 2018-10-19 DIAGNOSIS — M6389 Disorders of muscle in diseases classified elsewhere, multiple sites: Secondary | ICD-10-CM | POA: Diagnosis not present

## 2018-10-19 DIAGNOSIS — M62562 Muscle wasting and atrophy, not elsewhere classified, left lower leg: Secondary | ICD-10-CM | POA: Diagnosis not present

## 2018-10-19 DIAGNOSIS — R2689 Other abnormalities of gait and mobility: Secondary | ICD-10-CM | POA: Diagnosis not present

## 2018-10-19 DIAGNOSIS — I2510 Atherosclerotic heart disease of native coronary artery without angina pectoris: Secondary | ICD-10-CM | POA: Diagnosis not present

## 2018-10-19 DIAGNOSIS — N3946 Mixed incontinence: Secondary | ICD-10-CM | POA: Diagnosis not present

## 2018-10-19 DIAGNOSIS — D649 Anemia, unspecified: Secondary | ICD-10-CM | POA: Diagnosis not present

## 2018-10-19 DIAGNOSIS — Z79899 Other long term (current) drug therapy: Secondary | ICD-10-CM | POA: Diagnosis not present

## 2018-10-19 DIAGNOSIS — M25511 Pain in right shoulder: Secondary | ICD-10-CM | POA: Diagnosis not present

## 2018-10-19 DIAGNOSIS — M2550 Pain in unspecified joint: Secondary | ICD-10-CM | POA: Diagnosis not present

## 2018-10-20 DIAGNOSIS — M6389 Disorders of muscle in diseases classified elsewhere, multiple sites: Secondary | ICD-10-CM | POA: Diagnosis not present

## 2018-10-20 DIAGNOSIS — M62561 Muscle wasting and atrophy, not elsewhere classified, right lower leg: Secondary | ICD-10-CM | POA: Diagnosis not present

## 2018-10-20 DIAGNOSIS — M62562 Muscle wasting and atrophy, not elsewhere classified, left lower leg: Secondary | ICD-10-CM | POA: Diagnosis not present

## 2018-10-20 DIAGNOSIS — M25511 Pain in right shoulder: Secondary | ICD-10-CM | POA: Diagnosis not present

## 2018-10-20 DIAGNOSIS — R2689 Other abnormalities of gait and mobility: Secondary | ICD-10-CM | POA: Diagnosis not present

## 2018-10-20 DIAGNOSIS — N3946 Mixed incontinence: Secondary | ICD-10-CM | POA: Diagnosis not present

## 2018-10-20 NOTE — Progress Notes (Signed)
[facility]   MULTIDISCIPLINARY HEART VALVE CLINIC                                       Cardiology Office Note    Date:  10/22/2018   ID:  William [name], DOB 04/04/1928, MRN [id number]  PCP:  Tisovec, Richard W, MD  Cardiologist: Mihai Croitoru, MD / Dr. McAlhany & Dr. Bartle (TAVR)  CC: 1 month s/p TAVR  History of Present Illness:  William [name] is a 85 y.o. male with a history of AAA, NICM/chronic systolic CHF s/p ICD, ventricular tachycardia, OSA, HLD, degenerative arthritis/lumbar vertebral fracture with reduced mobility and severe AS s/p TAVR (08/18/18) who presents to clinic for follow up.   The patient has been followed by Dr. Croitoru. He was recently admitted with A/C CHF and treated with diuresis. Echocardiogram on 07/03/2018 showed a mean gradient across aortic valve of 21 mmHg. Peak gradient was 53 mmHg and dimensionless index was 0.16. Aortic valve area was 0.66 cm. Left ventricular ejection fraction was 20 to 25%. He has had progressive dyspnea with low-level exertion. He is not very active due to a previous fall down the stairs at home about 5 years ago where he broke his back. He does ambulate some in his house using a walker and if he gets out and has to go any distance he uses a wheelchair. He was seen by Dr. McAlhany for consideration of transcatheter aortic valve replacement. Cardiac catheterization was performed on 07/06/2018 which showed mild nonobstructive coronary disease.He was seen by Dr. Bartle in consultation on 10/22 and felt to be a candidate for TAVR. He was scheduled for TAVR on 08/11/2018 but unfortunately was admitted with urosepsis on 08/07/2018. Urine culture grew klebsiella and he was treated with antibiotics and fluid. TAVR was postponed.  He underwent successful TAVR with a 29 mm Edwards Sapien 3 THV via the TF approach on 08/18/18. Post operative echo showed EF 30%, normally functioning TAVR with no PVL; mean gradient of 9 mm Hg. He  was discharged on ASA and plavix. He initially did very well and felt much improved after TAVR.  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 He underwent device extraction 09/15/2018. He was discharged on 6 weeks of IV antibiotics through PICC line, plan to be discontinued 10/27/2018.   He was seen in the ER on 10/10/18 for a head laceration repair after a fall.   Today he presents to clinic for follow up. He is brought in on a stretcher by EMS given debility. He is currently recovering at [facility]. Can stand but otherwise is bed or wheelchair bound. No CP or SOB. No LE edema, orthopnea or PND. No dizziness or syncope. No blood in stool or urine. No palpitations.    Past Medical History:  Diagnosis Date  . AAA (abdominal aortic aneurysm) (HCC)    7/13 3.8cm  . Arthritis    "joints" (01/11/2014)  . Asthma    "seasonal; some foods"   . Chronic systolic CHF (congestive heart failure) (HCC)   . Dementia (HCC)   . HLD (hyperlipidemia)   . HOH (hard of hearing)   . Lumbar vertebral fracture (HCC)    L1- 04/11/2014   . Prostate cancer (HCC)   . S/P ICD (internal cardiac defibrillator) procedure, 01/11/14 removal of ERI gen and placement of Medtronic Evera XT VR &   NEW Right ventricular lead Medtronic 01/12/2014  . S/P TAVR (transcatheter aortic valve replacement)    Edwards Sapien 3 THV (size 29 mm, model # 9600CM29A, serial # [id number])  . Severe aortic stenosis   . Sleep apnea    "lost 60# & don't have it anymore" (01/11/2014)  . Ventricular tachycardia (HCC)     Past Surgical History:  Procedure Laterality Date  . CARDIAC CATHETERIZATION  08/20/2004   noncritical CAD,mild global hypokinesis, EF 50%  . CARDIAC DEFIBRILLATOR PLACEMENT  08/23/2004   Medtronic  . CATARACT EXTRACTION W/ INTRAOCULAR LENS IMPLANT Right   . COLECTOMY  1990's  . CRYOABLATION N/A 02/24/2014   Procedure: CRYO ABLATION PROSTATE;  Surgeon: Sigmund I Tannenbaum,  MD;  Location: [facility];  Service: Urology;  Laterality: N/A;  . HERNIA REPAIR     "abdomen; from colon OR"  . ICD LEAD REMOVAL N/A 09/15/2018   Procedure: ICD LEAD REMOVAL EXTRACTION;  Surgeon: Taylor, Gregg W, MD;  Location: [facility];  Service: Cardiovascular;  Laterality: N/A;  DR. BARTLE TO BACK UP  . IMPLANTABLE CARDIOVERTER DEFIBRILLATOR (ICD) GENERATOR CHANGE N/A 01/11/2014   Procedure: ICD GENERATOR CHANGE;  Surgeon: Mihai Croitoru, MD;  Location: [facility]ATH [facility];  Service: Cardiovascular;  Laterality: N/A;  . LEAD REVISION N/A 01/11/2014   Procedure: LEAD REVISION;  Surgeon: Mihai Croitoru, MD;  Location: [facility];  Service: Cardiovascular;  Laterality: N/A;  . NM MYOCAR PERF WALL MOTION  01/29/2012   abnormal c/o infarct/scar,no ischemia present  . PROSTATE BIOPSY N/A 11/22/2013   Procedure: PROSTATE BIOPSY AND ULTRASOUND;  Surgeon: Sigmund I Tannenbaum, MD;  Location: [facility];  Service: Urology;  Laterality: N/A;  . RIGHT/LEFT HEART CATH AND CORONARY ANGIOGRAPHY N/A 07/06/2018   Procedure: RIGHT/LEFT HEART CATH AND CORONARY ANGIOGRAPHY;  Surgeon: Kelly, Thomas A, MD;  Location: [facility] INVASIVE CV LAB;  Service: Cardiovascular;  Laterality: N/A;  . TEE WITHOUT CARDIOVERSION N/A 08/18/2018   Procedure: TRANSESOPHAGEAL ECHOCARDIOGRAM (TEE);  Surgeon: McAlhany, Christopher D, MD;  Location: [facility] OR;  Service: Open Heart Surgery;  Laterality: N/A;  . TEE WITHOUT CARDIOVERSION N/A 09/08/2018   Procedure: TRANSESOPHAGEAL ECHOCARDIOGRAM (TEE);  Surgeon: Crenshaw, Brian S, MD;  Location: [facility] ENDOS[facility];  Service: Cardiovascular;  Laterality: N/A;  . TEE WITHOUT CARDIOVERSION N/A 09/15/2018   Procedure: TRANSESOPHAGEAL ECHOCARDIOGRAM (TEE);  Surgeon: Taylor, Gregg W, MD;  Location: [facility];  Service: Cardiovascular;  Laterality: N/A;  . TRANSCATHETER AORTIC VALVE REPLACEMENT, TRANSFEMORAL  08/18/2018  . TRANSCATHETER AORTIC VALVE REPLACEMENT, TRANSFEMORAL N/A 08/18/2018   Procedure: TRANSCATHETER AORTIC VALVE  REPLACEMENT, TRANSFEMORAL using an Edwards 29mm Aortic Valve;  Surgeon: McAlhany, Christopher D, MD;  Location: [facility];  Service: Open Heart Surgery;  Laterality: N/A;    Current Medications: Outpatient Medications Prior to Visit  Medication Sig Dispense Refill  . acetaminophen (TYLENOL) 500 MG tablet Take 500 mg by mouth every 6 (six) hours as needed (for pain).     . amiodarone (PACERONE) 200 MG tablet TAKE 1/2 TABLET BY MOUTH EVERY DAY (Patient taking differently: Take 100 mg by mouth daily. ) 45 tablet 0  . aspirin 81 MG chewable tablet Chew 1 tablet (81 mg total) by mouth daily. 90 tablet 3  . cholecalciferol (VITAMIN D) 1000 units tablet Take 2,000 Units by mouth at bedtime.    . clobetasol cream (TEMOVATE) 0.05 % Apply 1 application topically 2 (two) times daily. To coccyx area and Right and Left inner buttocks    . clopidogrel (PLAVIX) 75 MG tablet Take 1 tablet (  75 mg total) by mouth daily with breakfast. 90 tablet 1  . ertapenem (INVANZ) IVPB Inject 1 g into the vein daily. Indication:  ESBL Klebsiella endocarditis  Last Day of Therapy:  10/27/18 Labs - Once weekly:  CBC/D and BMP, Labs - Every other week:  ESR and CRP 42 Units 0  . ezetimibe-simvastatin (VYTORIN) 10-20 MG per tablet Take 1 tablet by mouth at bedtime.    . metoprolol succinate (TOPROL-XL) 25 MG 24 hr tablet Take 1 tablet (25 mg total) by mouth daily. Take with or immediately following a meal.    . Multiple Vitamins-Minerals (PRESERVISION AREDS 2 PO) Take 1 capsule by mouth daily.     . polyethylene glycol (MIRALAX / GLYCOLAX) packet Take 17 g by mouth every evening. 14 each 1  . cetirizine (ZYRTEC) 10 MG tablet Take 10 mg by mouth daily as needed for allergies.    . senna (SENOKOT) 8.6 MG TABS tablet Take 1 tablet (8.6 mg total) by mouth daily. 120 each 0   No facility-administered medications prior to visit.      Allergies:   Crestor [rosuvastatin]; Lipitor [atorvastatin]; and Shrimp [shellfish allergy]   Social  History   Socioeconomic History  . Marital status: Married    Spouse name: Not on file  . Number of children: 2  . Years of education: Not on file  . Highest education level: Not on file  Occupational History  . 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 Financial resource strain: Not on file  . Food insecurity:    Worry: Not on file    Inability: Not on file  . Transportation needs:    Medical: Not on file    Non-medical: Not on file  Tobacco Use  . Smoking status: Never Smoker  . Smokeless tobacco: Never Used  Substance and Sexual Activity  . Alcohol use: Yes    Alcohol/week: 0.0 standard drinks    Comment: 01/11/2014 "drink 1/2 of a beer twice per month   . Drug use: No  . Sexual activity: Not Currently  Lifestyle  . Physical activity:    Days per week: Not on file    Minutes per session: Not on file  . Stress: Not on file  Relationships  . Social connections:    Talks on phone: Not on file    Gets together: Not on file    Attends religious service: Not on file    Active member of club or organization: Not on file    Attends meetings of clubs or organizations: Not on file    Relationship status: Not on file  Other Topics Concern  . Not on file  Social History Narrative  . Not on file     Family History:  The patient's family history includes Heart failure in his mother; Pneumonia in his father.      ROS:   Please see the history of present illness.    ROS All other systems reviewed and are negative.   PHYSICAL EXAM:   VS:  BP 101/68   Pulse 90   Ht 5' 10" (1.778 m)   SpO2 99%   BMI 30.85 kg/m    GEN: Well nourished, well developed, in no acute distress, elderly weak white male laying in a stretcher  HEENT: normal Neck: no JVD or masses Cardiac: RRR; soft flow murmur. No rubs, or gallops.  Respiratory:  clear to auscultation bilaterally, normal work of breathing GI: soft, nontender, nondistended, + BS MS:   no deformity or  atrophy Skin: warm and dry, no rash.  Neuro:  Alert and Oriented x 3, Strength and sensation are intact Psych: euthymic mood, full affect   Wt Readings from Last 3 Encounters:  09/22/18 215 lb (97.5 kg)  09/21/18 215 lb 9.8 oz (97.8 kg)  08/27/18 218 lb 12.8 oz (99.2 kg)      Studies/Labs Reviewed:   EKG:  EKG is NOT ordered today.   Recent Labs: 08/13/2018: B Natriuretic Peptide 459.6 09/09/2018: ALT 42 09/15/2018: Magnesium 2.4 10/05/2018: BUN 18; Creatinine 0.8; Hemoglobin 11.7; Platelets 158; Potassium 4.2; Sodium 137   Lipid Panel No results found for: CHOL, TRIG, HDL, CHOLHDL, VLDL, LDLCALC, LDLDIRECT  Additional studies/ records that were reviewed today include:   TAVR OPERATIVE NOTE   Date of Procedure:08/18/2018  Preoperative Diagnosis:Severe Aortic Stenosis  Procedure:   Transcatheter Aortic Valve Replacement - PercutaneousRightTransfemoral Approach Edwards Sapien 3 THV (size 29mm, model # 9600TFX, serial # [id number])  Co-Surgeons:Bryan K. Bartle, MD and Christopher McAlhany, MD  Pre-operative Echo Findings: ? Severe aortic stenosis ? Moderateleft ventricular systolic function  Post-operative Echo Findings: ? Noparavalvular leak ? Moderateleft ventricular systolic function  _____________    Echo 08/19/18 Study Conclusions - Left ventricle: The cavity size was normal. Wall thickness was increased in a pattern of mild LVH. The estimated ejection fraction was 30%. Diffuse hypokinesis. Doppler parameters are consistent with abnormal left ventricular relaxation (grade 1 diastolic dysfunction). - Aortic valve: Bioprosthetic aortic valve s/p TAVR. No significant bioprosthetic aortic valve stenosis. There was no regurgitation. Mean gradient (S): 9 mm Hg. Valve area (VTI): 2.65 cm^2. - Aorta: Mildly dilated aortic root. Aortic root dimension: 40 mm (ED). - Mitral  valve: Mildly calcified annulus. Mildly calcified leaflets . There was mild regurgitation. - Left atrium: The atrium was moderately dilated. - Right ventricle: The cavity size was normal. Pacer wire or catheter noted in right ventricle. Systolic function was normal. - Right atrium: The atrium was mildly dilated. - Tricuspid valve: Peak RV-RA gradient (S): 33 mm Hg. - Systemic veins: IVC not visualized. Impressions: - Normal LV size with mild LV hypertrophy. EF 30%, diffuse hypokinesis. Normal RV size and systolic function. Mild mitral regurgitation. Bioprosthetic aortic valve s/p TAVR, no significant bioprosthetic valve stenosis and no aortic insufficiency.  _______________  Echo 10/21/18 Study Conclusions - Left ventricle: The cavity size was normal. Wall thickness was   increased in a pattern of mild LVH. Systolic function was   moderately to severely reduced. The estimated ejection fraction   was in the range of 30% to 35%. Diffuse hypokinesis. Although no   diagnostic regional wall motion abnormality was identified, this   possibility cannot be completely excluded on the basis of this   study. Findings consistent with left ventricular diastolic   dysfunction. - Aortic valve: A prosthesis was present and functioning normally.   The prosthesis had a normal range of motion. The sewing ring   appeared normal, had no rocking motion, and showed no evidence of   dehiscence. Impressions: - Technically difficult study. LV EF appears reduced, in the range   of 30-35%. Slightly improved from TEE images and similar to prior   chest wall images. S/P TAVR, and valve appears well seated. No   clear evidence of vegetation on these images.   ASSESSMENT & PLAN:   Severe AS s/p TAVR: echo today shows EF 30-35%, normally functioning TAVR with mean gradient of 8 mm Hg and no PVL. He has NYHA class I symptoms   but non ambulatory at this point. Working hard with PT at [facility] to  regain strength after recent prolonged hospital stay. SBE prophylaxis discussed; amoxicillin called into pharmacy. Plavix can be discontinued after 6 months of therapy. (02/2019)  HTN: BP on soft side today. No changes made  Hx of ventricular tachycardia: continue amiodarone. ICD recently removed due to vegetation on lead  ICD lead vegetation: s/p extraction last month. On IV Abx through PICC line to be discontinued next week by ID. Echo today with no evidence of vegetations.   Chronic combined S/D CHF: EF remains ~30-35%; he appears euvolecmic. Continue current regimen  AAA: infrarenal AAA 4.4 cm. This has been followed by Dr. Croitoru for years but he decided to stop following it in 02/2018 as the patient was against any potential interventions.  Pulmonary nodules: multiple small pulmonary nodules noted throughout the lungs bilaterally. Incidental findings on pre TAVR CT. 6 month follow up non contrast CT recommended. I will get this set up up today  Medication Adjustments/Labs and Tests Ordered: Current medicines are reviewed at length with the patient today.  Concerns regarding medicines are outlined above.  Medication changes, Labs and Tests ordered today are listed in the Patient Instructions below. Patient Instructions  Medication Instructions:  You may STOP PLAVIX Feb 17, 2019 (unless instructed otherwise before then)  Labwork: None  Testing/Procedures: Katie recommends you have a CT in May 2020.  Your provider has requested that you have an echocardiogram for your one year TAVR visit. Echocardiography is a painless test that uses sound waves to create images of your heart. It provides your doctor with information about the size and shape of your heart and how well your heart's chambers and valves are working. This procedure takes approximately one hour. There are no restrictions for this procedure.  Follow-Up: You are scheduled for your 1 year TAVR visits on Thursday, August 12, 2019. Please arrive at 12:30PM for your echocardiogram and subsequent office visit with Katie , PA.    Signed,  , PA-C  10/22/2018 6:54 AM    [facility] [address], [place],   [address] Phone: [phone]; Fax: [phone]    

## 2018-10-21 ENCOUNTER — Ambulatory Visit (HOSPITAL_COMMUNITY): Payer: Medicare Other | Attending: Cardiology

## 2018-10-21 ENCOUNTER — Ambulatory Visit (INDEPENDENT_AMBULATORY_CARE_PROVIDER_SITE_OTHER): Payer: Medicare Other | Admitting: Physician Assistant

## 2018-10-21 ENCOUNTER — Other Ambulatory Visit: Payer: Self-pay | Admitting: Physician Assistant

## 2018-10-21 ENCOUNTER — Other Ambulatory Visit: Payer: Self-pay

## 2018-10-21 ENCOUNTER — Encounter: Payer: Self-pay | Admitting: Physician Assistant

## 2018-10-21 VITALS — BP 101/68 | HR 90 | Ht 70.0 in

## 2018-10-21 DIAGNOSIS — Z952 Presence of prosthetic heart valve: Secondary | ICD-10-CM | POA: Insufficient documentation

## 2018-10-21 DIAGNOSIS — M25511 Pain in right shoulder: Secondary | ICD-10-CM | POA: Diagnosis not present

## 2018-10-21 DIAGNOSIS — I714 Abdominal aortic aneurysm, without rupture, unspecified: Secondary | ICD-10-CM

## 2018-10-21 DIAGNOSIS — M62561 Muscle wasting and atrophy, not elsewhere classified, right lower leg: Secondary | ICD-10-CM | POA: Diagnosis not present

## 2018-10-21 DIAGNOSIS — R918 Other nonspecific abnormal finding of lung field: Secondary | ICD-10-CM

## 2018-10-21 DIAGNOSIS — I472 Ventricular tachycardia, unspecified: Secondary | ICD-10-CM

## 2018-10-21 DIAGNOSIS — N3946 Mixed incontinence: Secondary | ICD-10-CM | POA: Diagnosis not present

## 2018-10-21 DIAGNOSIS — I5022 Chronic systolic (congestive) heart failure: Secondary | ICD-10-CM

## 2018-10-21 DIAGNOSIS — I10 Essential (primary) hypertension: Secondary | ICD-10-CM

## 2018-10-21 DIAGNOSIS — R2689 Other abnormalities of gait and mobility: Secondary | ICD-10-CM | POA: Diagnosis not present

## 2018-10-21 DIAGNOSIS — M6389 Disorders of muscle in diseases classified elsewhere, multiple sites: Secondary | ICD-10-CM | POA: Diagnosis not present

## 2018-10-21 DIAGNOSIS — M62562 Muscle wasting and atrophy, not elsewhere classified, left lower leg: Secondary | ICD-10-CM | POA: Diagnosis not present

## 2018-10-21 MED ORDER — AMOXICILLIN 500 MG PO TABS: ORAL_TABLET | ORAL | 9 refills | Status: DC

## 2018-10-21 NOTE — Patient Instructions (Addendum)
Medication Instructions:  You may STOP PLAVIX Feb 17, 2019 (unless instructed otherwise before then)  Labwork: None  Testing/Procedures: William [name] recommends you have a CT in May 2020.  Your provider has requested that you have an echocardiogram for your one year TAVR visit. Echocardiography is a painless test that uses sound waves to create images of your heart. It provides your doctor with information about the size and shape of your heart and how well your heart's chambers and valves are working. This procedure takes approximately one hour. There are no restrictions for this procedure.  Follow-Up: You are scheduled for your 1 year TAVR visits on Thursday, August 12, 2019. Please arrive at 12:30PM for your echocardiogram and subsequent office visit with William Range, PA.

## 2018-10-22 DIAGNOSIS — M62561 Muscle wasting and atrophy, not elsewhere classified, right lower leg: Secondary | ICD-10-CM | POA: Diagnosis not present

## 2018-10-22 DIAGNOSIS — R2689 Other abnormalities of gait and mobility: Secondary | ICD-10-CM | POA: Diagnosis not present

## 2018-10-22 DIAGNOSIS — M25511 Pain in right shoulder: Secondary | ICD-10-CM | POA: Diagnosis not present

## 2018-10-22 DIAGNOSIS — N3946 Mixed incontinence: Secondary | ICD-10-CM | POA: Diagnosis not present

## 2018-10-22 DIAGNOSIS — M6389 Disorders of muscle in diseases classified elsewhere, multiple sites: Secondary | ICD-10-CM | POA: Diagnosis not present

## 2018-10-22 DIAGNOSIS — M62562 Muscle wasting and atrophy, not elsewhere classified, left lower leg: Secondary | ICD-10-CM | POA: Diagnosis not present

## 2018-10-23 DIAGNOSIS — M62562 Muscle wasting and atrophy, not elsewhere classified, left lower leg: Secondary | ICD-10-CM | POA: Diagnosis not present

## 2018-10-23 DIAGNOSIS — M25511 Pain in right shoulder: Secondary | ICD-10-CM | POA: Diagnosis not present

## 2018-10-23 DIAGNOSIS — M62561 Muscle wasting and atrophy, not elsewhere classified, right lower leg: Secondary | ICD-10-CM | POA: Diagnosis not present

## 2018-10-23 DIAGNOSIS — R2689 Other abnormalities of gait and mobility: Secondary | ICD-10-CM | POA: Diagnosis not present

## 2018-10-23 DIAGNOSIS — N3946 Mixed incontinence: Secondary | ICD-10-CM | POA: Diagnosis not present

## 2018-10-23 DIAGNOSIS — M6389 Disorders of muscle in diseases classified elsewhere, multiple sites: Secondary | ICD-10-CM | POA: Diagnosis not present

## 2018-10-25 DIAGNOSIS — Z79899 Other long term (current) drug therapy: Secondary | ICD-10-CM | POA: Diagnosis not present

## 2018-10-25 DIAGNOSIS — D649 Anemia, unspecified: Secondary | ICD-10-CM | POA: Diagnosis not present

## 2018-10-26 DIAGNOSIS — R2689 Other abnormalities of gait and mobility: Secondary | ICD-10-CM | POA: Diagnosis not present

## 2018-10-26 DIAGNOSIS — M62561 Muscle wasting and atrophy, not elsewhere classified, right lower leg: Secondary | ICD-10-CM | POA: Diagnosis not present

## 2018-10-26 DIAGNOSIS — M62562 Muscle wasting and atrophy, not elsewhere classified, left lower leg: Secondary | ICD-10-CM | POA: Diagnosis not present

## 2018-10-26 DIAGNOSIS — N3946 Mixed incontinence: Secondary | ICD-10-CM | POA: Diagnosis not present

## 2018-10-26 DIAGNOSIS — M6389 Disorders of muscle in diseases classified elsewhere, multiple sites: Secondary | ICD-10-CM | POA: Diagnosis not present

## 2018-10-26 DIAGNOSIS — M25511 Pain in right shoulder: Secondary | ICD-10-CM | POA: Diagnosis not present

## 2018-10-27 DIAGNOSIS — M62561 Muscle wasting and atrophy, not elsewhere classified, right lower leg: Secondary | ICD-10-CM | POA: Diagnosis not present

## 2018-10-27 DIAGNOSIS — M25511 Pain in right shoulder: Secondary | ICD-10-CM | POA: Diagnosis not present

## 2018-10-27 DIAGNOSIS — N3946 Mixed incontinence: Secondary | ICD-10-CM | POA: Diagnosis not present

## 2018-10-27 DIAGNOSIS — R2689 Other abnormalities of gait and mobility: Secondary | ICD-10-CM | POA: Diagnosis not present

## 2018-10-27 DIAGNOSIS — M62562 Muscle wasting and atrophy, not elsewhere classified, left lower leg: Secondary | ICD-10-CM | POA: Diagnosis not present

## 2018-10-27 DIAGNOSIS — M6389 Disorders of muscle in diseases classified elsewhere, multiple sites: Secondary | ICD-10-CM | POA: Diagnosis not present

## 2018-10-28 ENCOUNTER — Encounter: Payer: Self-pay | Admitting: Internal Medicine

## 2018-10-28 ENCOUNTER — Ambulatory Visit: Payer: Medicare Other | Admitting: Internal Medicine

## 2018-10-28 ENCOUNTER — Ambulatory Visit (INDEPENDENT_AMBULATORY_CARE_PROVIDER_SITE_OTHER): Payer: Medicare Other | Admitting: Internal Medicine

## 2018-10-28 VITALS — BP 109/71 | HR 90 | Temp 97.7°F

## 2018-10-28 DIAGNOSIS — A09 Infectious gastroenteritis and colitis, unspecified: Secondary | ICD-10-CM

## 2018-10-28 DIAGNOSIS — Z7401 Bed confinement status: Secondary | ICD-10-CM | POA: Diagnosis not present

## 2018-10-28 DIAGNOSIS — M2550 Pain in unspecified joint: Secondary | ICD-10-CM | POA: Diagnosis not present

## 2018-10-28 DIAGNOSIS — R5381 Other malaise: Secondary | ICD-10-CM | POA: Diagnosis not present

## 2018-10-28 DIAGNOSIS — R630 Anorexia: Secondary | ICD-10-CM | POA: Diagnosis not present

## 2018-10-28 DIAGNOSIS — R531 Weakness: Secondary | ICD-10-CM | POA: Diagnosis not present

## 2018-10-28 DIAGNOSIS — I330 Acute and subacute infective endocarditis: Secondary | ICD-10-CM

## 2018-10-28 DIAGNOSIS — R404 Transient alteration of awareness: Secondary | ICD-10-CM | POA: Diagnosis not present

## 2018-10-28 LAB — CBC WITH DIFFERENTIAL/PLATELET
Absolute Monocytes: 460 cells/uL (ref 200–950)
BASOS PCT: 0.5 %
Basophils Absolute: 32 cells/uL (ref 0–200)
EOS ABS: 838 {cells}/uL — AB (ref 15–500)
Eosinophils Relative: 13.3 %
HCT: 32.8 % — ABNORMAL LOW (ref 38.5–50.0)
Hemoglobin: 11.1 g/dL — ABNORMAL LOW (ref 13.2–17.1)
Lymphs Abs: 888 cells/uL (ref 850–3900)
MCH: 32.6 pg (ref 27.0–33.0)
MCHC: 33.8 g/dL (ref 32.0–36.0)
MCV: 96.2 fL (ref 80.0–100.0)
MPV: 10 fL (ref 7.5–12.5)
Monocytes Relative: 7.3 %
Neutro Abs: 4082 cells/uL (ref 1500–7800)
Neutrophils Relative %: 64.8 %
PLATELETS: 131 10*3/uL — AB (ref 140–400)
RBC: 3.41 10*6/uL — ABNORMAL LOW (ref 4.20–5.80)
RDW: 14.3 % (ref 11.0–15.0)
Total Lymphocyte: 14.1 %
WBC: 6.3 10*3/uL (ref 3.8–10.8)

## 2018-10-28 NOTE — Progress Notes (Signed)
Per verbal order from Dr Baxter Flattery, 47 cm Single Lumen Peripherally Inserted Central Catheter removed from right basilic, tip intact. No sutures present. RN confirmed length per chart. Dressing was clean and dry. Petroleum dressing applied. This RN called Laureen Ochs, Rehab nurse at [facility] to let her know of PICC removal, to advised no heavy lifting with this arm, leave dressing for 24 hours and call the office or seek emergent care if dressing becomes soaked with blood or chest pain, difficulty breathing, or sharp pain presents. Information relayed to the patient's wife and daughter present as well.  Patient's family and nurse verbalized understanding and agreement.  Their questions were answered to their satisfaction. Patient tolerated procedure well, EMS wheeled patient back to non-emergent ambulance transport. Landis Gandy, RN

## 2018-10-29 ENCOUNTER — Encounter: Payer: Self-pay | Admitting: Adult Health

## 2018-10-29 DIAGNOSIS — M62562 Muscle wasting and atrophy, not elsewhere classified, left lower leg: Secondary | ICD-10-CM | POA: Diagnosis not present

## 2018-10-29 DIAGNOSIS — M25511 Pain in right shoulder: Secondary | ICD-10-CM | POA: Diagnosis not present

## 2018-10-29 DIAGNOSIS — N3946 Mixed incontinence: Secondary | ICD-10-CM | POA: Diagnosis not present

## 2018-10-29 DIAGNOSIS — R2689 Other abnormalities of gait and mobility: Secondary | ICD-10-CM | POA: Diagnosis not present

## 2018-10-29 DIAGNOSIS — M62561 Muscle wasting and atrophy, not elsewhere classified, right lower leg: Secondary | ICD-10-CM | POA: Diagnosis not present

## 2018-10-29 DIAGNOSIS — M6389 Disorders of muscle in diseases classified elsewhere, multiple sites: Secondary | ICD-10-CM | POA: Diagnosis not present

## 2018-10-29 NOTE — Progress Notes (Signed)
This encounter was created in error - please disregard.

## 2018-10-30 DIAGNOSIS — M6389 Disorders of muscle in diseases classified elsewhere, multiple sites: Secondary | ICD-10-CM | POA: Diagnosis not present

## 2018-10-30 DIAGNOSIS — R2689 Other abnormalities of gait and mobility: Secondary | ICD-10-CM | POA: Diagnosis not present

## 2018-10-30 DIAGNOSIS — M62561 Muscle wasting and atrophy, not elsewhere classified, right lower leg: Secondary | ICD-10-CM | POA: Diagnosis not present

## 2018-10-30 DIAGNOSIS — M25511 Pain in right shoulder: Secondary | ICD-10-CM | POA: Diagnosis not present

## 2018-10-30 DIAGNOSIS — N3946 Mixed incontinence: Secondary | ICD-10-CM | POA: Diagnosis not present

## 2018-10-30 DIAGNOSIS — M62562 Muscle wasting and atrophy, not elsewhere classified, left lower leg: Secondary | ICD-10-CM | POA: Diagnosis not present

## 2018-11-01 LAB — STOOL CULTURE
MICRO NUMBER: 62664
MICRO NUMBER:: 62663
MICRO NUMBER:: 62665
SHIGA RESULT: NOT DETECTED
SPECIMEN QUALITY:: ADEQUATE
SPECIMEN QUALITY:: ADEQUATE
SPECIMEN QUALITY:: ADEQUATE

## 2018-11-02 DIAGNOSIS — N3946 Mixed incontinence: Secondary | ICD-10-CM | POA: Diagnosis not present

## 2018-11-02 DIAGNOSIS — M6389 Disorders of muscle in diseases classified elsewhere, multiple sites: Secondary | ICD-10-CM | POA: Diagnosis not present

## 2018-11-02 DIAGNOSIS — M62561 Muscle wasting and atrophy, not elsewhere classified, right lower leg: Secondary | ICD-10-CM | POA: Diagnosis not present

## 2018-11-02 DIAGNOSIS — M62562 Muscle wasting and atrophy, not elsewhere classified, left lower leg: Secondary | ICD-10-CM | POA: Diagnosis not present

## 2018-11-02 DIAGNOSIS — R2689 Other abnormalities of gait and mobility: Secondary | ICD-10-CM | POA: Diagnosis not present

## 2018-11-02 DIAGNOSIS — M25511 Pain in right shoulder: Secondary | ICD-10-CM | POA: Diagnosis not present

## 2018-11-03 ENCOUNTER — Non-Acute Institutional Stay (SKILLED_NURSING_FACILITY): Payer: Medicare Other | Admitting: Internal Medicine

## 2018-11-03 ENCOUNTER — Encounter: Payer: Self-pay | Admitting: Internal Medicine

## 2018-11-03 DIAGNOSIS — N3946 Mixed incontinence: Secondary | ICD-10-CM | POA: Diagnosis not present

## 2018-11-03 DIAGNOSIS — M6389 Disorders of muscle in diseases classified elsewhere, multiple sites: Secondary | ICD-10-CM | POA: Diagnosis not present

## 2018-11-03 DIAGNOSIS — M62562 Muscle wasting and atrophy, not elsewhere classified, left lower leg: Secondary | ICD-10-CM | POA: Diagnosis not present

## 2018-11-03 DIAGNOSIS — R2689 Other abnormalities of gait and mobility: Secondary | ICD-10-CM | POA: Diagnosis not present

## 2018-11-03 DIAGNOSIS — M25511 Pain in right shoulder: Secondary | ICD-10-CM | POA: Diagnosis not present

## 2018-11-03 DIAGNOSIS — M62561 Muscle wasting and atrophy, not elsewhere classified, right lower leg: Secondary | ICD-10-CM | POA: Diagnosis not present

## 2018-11-03 DIAGNOSIS — L249 Irritant contact dermatitis, unspecified cause: Secondary | ICD-10-CM

## 2018-11-03 NOTE — Progress Notes (Signed)
Patient ID: William [name], male   DOB: [birth year], 85 y.o.   MRN: [id number]  Location:  [facility] Room Number: [place] of Service:  SNF (31) Provider:  Kamron Portee L. Mariea Clonts, D.O., C.M.D.  Tisovec, Fransico Him, MD  Patient Care Team: Tisovec, Fransico Him, MD as PCP - General (Internal Medicine) Croitoru, Dani Gobble, MD as PCP - Cardiology (Cardiology) Carolan Clines, MD (Inactive) as Consulting Physician (Urology)  Extended Emergency Contact Information Primary Emergency Contact: [facility] Address: [address]          [place], [place] [address] [name] of [place] Phone: [phone] Mobile Phone: [phone] Relation: Spouse Secondary Emergency Contact: Michail Sermon, [place] Phone: [phone] Mobile Phone: [phone] Relation: Daughter  Goals of care: Advanced Directive information Advanced Directives 09/05/2018  Does Patient Have a Medical Advance Directive? No  Type of Advance Directive -  Does patient want to make changes to medical advance directive? -  Copy of [place] in Chart? -  Would patient like information on creating a medical advance directive? No - Patient declined  Pre-existing out of facility DNR order (yellow form or pink MOST form) -  Pt here for rehab and he does not live at [facility] directives are not on file as a result.  Rehab nurse was going to obtain these documents from his wife for his chart.   Chief Complaint  Patient presents with  . Acute Visit    rash left thigh    HPI:  Pt is a 85 y.o. male with dementia, severe AS s/p TAVR, systolic chf, CAD, prior VT with ICD that recently became infected after he had sepsis secondary to UTI--it's been removed and he was here for IV abx which he's completed. He was seen today for an acute visit for raised, erythematous, pruritic rash of left thigh with some vesicular  lesions, no pain.  He also has a mild case on his right thigh.  He's been scratching and there is some blood on his sheets.  Past Medical History:  Diagnosis Date  . AAA (abdominal aortic aneurysm) ([place])    7/13 3.8cm  . Arthritis    "joints" (01/11/2014)  . Asthma    "seasonal; some foods"   . Chronic systolic CHF (congestive heart failure) ([place])   . Dementia ([place])   . HLD (hyperlipidemia)   . HOH (hard of hearing)   . Lumbar vertebral fracture ([facility])    L1- 04/11/2014   . Prostate cancer ([place])   . S/P ICD (internal cardiac defibrillator) procedure, 01/11/14 removal of ERI gen and placement of Medtronic Evera XT VR & NEW Right ventricular lead Medtronic 01/12/2014  . S/P TAVR (transcatheter aortic valve replacement)    Edwards Sapien 3 THV (size 29 mm, model # [id number], serial # [id number])  . Severe aortic stenosis   . Sleep apnea    "lost 60# & don't have it anymore" (01/11/2014)  . Ventricular tachycardia [facility])    Past Surgical History:  Procedure Laterality Date  . CARDIAC CATHETERIZATION  08/20/2004   noncritical CAD,mild global hypokinesis, EF 50%  . CARDIAC DEFIBRILLATOR PLACEMENT  08/23/2004   Medtronic  . CATARACT EXTRACTION W/ INTRAOCULAR LENS IMPLANT Right   . COLECTOMY  1990's  . CRYOABLATION N/A 02/24/2014   Procedure: CRYO ABLATION PROSTATE;  Surgeon: Ailene Rud, MD;  Location: [facility] ORS;  Service: Urology;  Laterality: N/A;  . HERNIA  REPAIR     "abdomen; from colon OR"  . ICD LEAD REMOVAL N/A 09/15/2018   Procedure: ICD LEAD REMOVAL EXTRACTION;  Surgeon: Evans Lance, MD;  Location: [facility] OR;  Service: Cardiovascular;  Laterality: N/A;  DR. BARTLE TO BACK UP  . IMPLANTABLE CARDIOVERTER DEFIBRILLATOR (ICD) GENERATOR CHANGE N/A 01/11/2014   Procedure: ICD GENERATOR CHANGE;  Surgeon: Sanda Klein, MD;  Location: [facility] CATH LAB;  Service: Cardiovascular;  Laterality: N/A;  . LEAD REVISION N/A 01/11/2014   Procedure: LEAD REVISION;  Surgeon: Sanda Klein, MD;  Location:  [facility] CATH LAB;  Service: Cardiovascular;  Laterality: N/A;  . NM MYOCAR PERF WALL MOTION  01/29/2012   abnormal c/o infarct/scar,no ischemia present  . PROSTATE BIOPSY N/A 11/22/2013   Procedure: PROSTATE BIOPSY AND ULTRASOUND;  Surgeon: Ailene Rud, MD;  Location: [facility] ORS;  Service: Urology;  Laterality: N/A;  . RIGHT/LEFT HEART CATH AND CORONARY ANGIOGRAPHY N/A 07/06/2018   Procedure: RIGHT/LEFT HEART CATH AND CORONARY ANGIOGRAPHY;  Surgeon: Troy Sine, MD;  Location: [facility] CV LAB;  Service: Cardiovascular;  Laterality: N/A;  . TEE WITHOUT CARDIOVERSION N/A 08/18/2018   Procedure: TRANSESOPHAGEAL ECHOCARDIOGRAM (TEE);  Surgeon: Burnell Blanks, MD;  Location: [facility];  Service: Open Heart Surgery;  Laterality: N/A;  . TEE WITHOUT CARDIOVERSION N/A 09/08/2018   Procedure: TRANSESOPHAGEAL ECHOCARDIOGRAM (TEE);  Surgeon: Lelon Perla, MD;  Location[facility] ENDOSCOPY;  Service: Cardiovascular;  Laterality: N/A;  . TEE WITHOUT CARDIOVERSION N/A 09/15/2018   Procedure: TRANSESOPHAGEAL ECHOCARDIOGRAM (TEE);  Surgeon: Evans Lance, MD;  Location: [facility];  Service: Cardiovascular;  Laterality: N/A;  . TRANSCATHETER AORTIC VALVE REPLACEMENT, TRANSFEMORAL  08/18/2018  . TRANSCATHETER AORTIC VALVE REPLACEMENT, TRANSFEMORAL N/A 08/18/2018   Procedure: TRANSCATHETER AORTIC VALVE REPLACEMENT, TRANSFEMORAL using an Edwards 60mm Aortic Valve;  Surgeon: Burnell Blanks, MD;  Location: [facility];  Service: Open Heart Surgery;  Laterality: N/A;    Allergies  Allergen Reactions  . Crestor [Rosuvastatin] Other (See Comments)    Hurts muscles  . Lipitor [Atorvastatin] Other (See Comments)    Hurts stomach  . Shrimp [Shellfish Allergy] Other (See Comments)    On MAR    Outpatient Encounter Medications as of 11/03/2018  Medication Sig  . acetaminophen (TYLENOL) 500 MG tablet Take 500 mg by mouth every 6 (six) hours as needed (for pain).   Marland Kitchen amiodarone (PACERONE) 200 MG tablet TAKE 1/2  TABLET BY MOUTH EVERY DAY  . aspirin 81 MG chewable tablet Chew 1 tablet (81 mg total) by mouth daily.  . cholecalciferol (VITAMIN D) 1000 units tablet Take 2,000 Units by mouth at bedtime.  . clopidogrel (PLAVIX) 75 MG tablet Take 1 tablet (75 mg total) by mouth daily with breakfast.  . ezetimibe-simvastatin (VYTORIN) 10-20 MG per tablet Take 1 tablet by mouth at bedtime.  . metoprolol succinate (TOPROL-XL) 25 MG 24 hr tablet Take 1 tablet (25 mg total) by mouth daily. Take with or immediately following a meal.  . Multiple Vitamins-Minerals (PRESERVISION AREDS 2 PO) Take 1 capsule by mouth daily.   Marland Kitchen nystatin-triamcinolone (MYCOLOG II) cream Apply 1 application topically 2 (two) times daily.  . polyethylene glycol (MIRALAX / GLYCOLAX) packet Take 17 g by mouth every evening.  . triamcinolone (KENALOG) 0.025 % cream Apply 1 application topically 2 (two) times daily. Until resolved  . [DISCONTINUED] amoxicillin (AMOXIL) 500 MG tablet Take 4 tablets (2,000mg ) one hour prior to dental visits.  . [DISCONTINUED] clobetasol cream (TEMOVATE) 7.12 % Apply 1 application topically 2 (  two) times daily. To coccyx area and Right and Left inner buttocks   No facility-administered encounter medications on file as of 11/03/2018.     Review of Systems  Skin: Positive for rash.       itching    Immunization History  Administered Date(s) Administered  . Influenza, High Dose Seasonal PF 08/13/2017, 07/03/2018  . Influenza,inj,Quad PF,6+ Mos 08/18/2013, 07/23/2016  . Pneumococcal Conjugate-13 08/14/2013  . Pneumococcal Polysaccharide-23 04/06/2009   Pertinent  Health Maintenance Due  Topic Date Due  . INFLUENZA VACCINE  Completed  . PNA vac Low Risk Adult  Completed   No flowsheet data found. Functional Status Survey:    Vitals:   11/03/18 1033  BP: 108/65  Pulse: 86  Resp: 16  Temp: 98.4 F (36.9 C)  TempSrc: Oral  Weight: 211 lb (95.7 kg)  Height: 5\' 10"  (1.778 m)   Body mass index is  30.28 kg/m. Physical Exam Constitutional:      Appearance: Normal appearance.  HENT:     Ears:     Comments: HOH Skin:    Comments: Left thigh with raised, pruritic, papulovesicular rash with evidence of excoriation, some on right thigh, but less severe, no warmth, no scaling,no drainage, nontender  Neurological:     Mental Status: He is alert.     Labs reviewed: Recent Labs    08/07/18 0512 08/07/18 1411  09/08/18 0719 09/09/18 0826  09/15/18 0343  09/17/18 0332 09/18/18 0850 09/21/18 0413 10/05/18 0500  NA 134* 138   < > 135 132*   < > 133*   < > 132* 135 137 137  K 3.6 3.2*   < > 3.5 3.7   < > 3.7   < > 3.6 3.5 3.6 4.2  CL 100 107   < > 105 103   < > 101   < > 99 102 103  --   CO2 26 25   < > 23 21*   < > 24   < > 24 24 25   --   GLUCOSE 164* 102*   < > 129* 126*   < > 155*   < > 121* 171* 130*  --   BUN 16 12   < > 12 13   < > 17   < > 16 14 19 18   CREATININE 1.30* 1.16   < > 0.95 0.92   < > 1.01   < > 0.77 0.88 0.81 0.8  CALCIUM 8.8* 8.2*   < > 8.1* 8.2*   < > 8.1*   < > 7.7* 7.8* 7.8*  --   MG 2.2 2.2   < > 2.3 2.2  --  2.4  --   --   --   --   --   PHOS 3.6 3.3  --   --   --   --   --   --   --   --   --   --    < > = values in this interval not displayed.   Recent Labs    09/07/18 0303 09/08/18 0719 09/09/18 0826  AST 34 28 26  ALT 66* 47* 42  ALKPHOS 203* 175* 155*  BILITOT 1.2 1.2 1.4*  PROT 5.7* 5.7* 5.6*  ALBUMIN 2.5* 2.4* 2.5*   Recent Labs    09/18/18 0850 09/21/18 0413 10/05/18 0500 10/28/18 1629  WBC 6.3 5.5 8.2 6.3  NEUTROABS 4.6 2.9  --  4,082  HGB 9.9* 9.7* 11.7* 11.1*  HCT 29.0* 28.5* 33* 32.8*  MCV 96.3 97.3  --  96.2  PLT 129* 144* 158 131*   Lab Results  Component Value Date   TSH 1.75 02/05/2017   Lab Results  Component Value Date   HGBA1C 5.2 08/13/2018   No results found for: CHOL, HDL, LDLCALC, LDLDIRECT, TRIG, CHOLHDL  Significant Diagnostic Results in last 30 days:  Ct Head Wo Contrast  Result Date:  10/09/2018 CLINICAL DATA:  Fall while on anti coagulation therapy. EXAM: CT HEAD WITHOUT CONTRAST CT CERVICAL SPINE WITHOUT CONTRAST TECHNIQUE: Multidetector CT imaging of the head and cervical spine was performed following the standard protocol without intravenous contrast. Multiplanar CT image reconstructions of the cervical spine were also generated. COMPARISON:  Head CT 04/12/2013 FINDINGS: CT HEAD FINDINGS Brain: There is no mass, hemorrhage or extra-axial collection. There is generalized atrophy without lobar predilection. There is hypoattenuation of the periventricular white matter, most commonly indicating chronic ischemic microangiopathy. Vascular: Atherosclerotic calcification of the vertebral and internal carotid arteries at the skull base. No abnormal hyperdensity of the major intracranial arteries or dural venous sinuses. There is dolichoectasia of the basilar artery, unchanged. Skull: Left frontal scalp laceration.  No skull fracture. Sinuses/Orbits: No fluid levels or advanced mucosal thickening of the visualized paranasal sinuses. No mastoid or middle ear effusion. The orbits are normal. CT CERVICAL SPINE FINDINGS Alignment: No static subluxation. Facets are aligned. Occipital condyles are normally positioned. Skull base and vertebrae: No acute fracture. Soft tissues and spinal canal: No prevertebral fluid or swelling. No visible canal hematoma. Disc levels: Facet hypertrophy is worst at right C2-3 and left C3-4. No bony spinal canal stenosis. Upper chest: No pneumothorax, pulmonary nodule or pleural effusion. Other: Normal visualized paraspinal cervical soft tissues. IMPRESSION: 1. No acute intracranial abnormality. 2. Left frontal scalp laceration without skull fracture. 3. No acute fracture or static subluxation of the cervical spine. 4. Chronic ischemic microangiopathy and basilar dolichoectasia. Electronically Signed   By: Ulyses Jarred M.D.   On: 10/09/2018 23:50   Ct Cervical Spine Wo  Contrast  Result Date: 10/09/2018 CLINICAL DATA:  Fall while on anti coagulation therapy. EXAM: CT HEAD WITHOUT CONTRAST CT CERVICAL SPINE WITHOUT CONTRAST TECHNIQUE: Multidetector CT imaging of the head and cervical spine was performed following the standard protocol without intravenous contrast. Multiplanar CT image reconstructions of the cervical spine were also generated. COMPARISON:  Head CT 04/12/2013 FINDINGS: CT HEAD FINDINGS Brain: There is no mass, hemorrhage or extra-axial collection. There is generalized atrophy without lobar predilection. There is hypoattenuation of the periventricular white matter, most commonly indicating chronic ischemic microangiopathy. Vascular: Atherosclerotic calcification of the vertebral and internal carotid arteries at the skull base. No abnormal hyperdensity of the major intracranial arteries or dural venous sinuses. There is dolichoectasia of the basilar artery, unchanged. Skull: Left frontal scalp laceration.  No skull fracture. Sinuses/Orbits: No fluid levels or advanced mucosal thickening of the visualized paranasal sinuses. No mastoid or middle ear effusion. The orbits are normal. CT CERVICAL SPINE FINDINGS Alignment: No static subluxation. Facets are aligned. Occipital condyles are normally positioned. Skull base and vertebrae: No acute fracture. Soft tissues and spinal canal: No prevertebral fluid or swelling. No visible canal hematoma. Disc levels: Facet hypertrophy is worst at right C2-3 and left C3-4. No bony spinal canal stenosis. Upper chest: No pneumothorax, pulmonary nodule or pleural effusion. Other: Normal visualized paraspinal cervical soft tissues. IMPRESSION: 1. No acute intracranial abnormality. 2. Left frontal scalp laceration without skull fracture. 3. No acute fracture or  static subluxation of the cervical spine. 4. Chronic ischemic microangiopathy and basilar dolichoectasia. Electronically Signed   By: Ulyses Jarred M.D.   On: 10/09/2018 23:50    Dg Shoulder Left  Result Date: 10/10/2018 CLINICAL DATA:  Status post fall with left shoulder pain. EXAM: LEFT SHOULDER - 2+ VIEW COMPARISON:  None. FINDINGS: There is no evidence of fracture or dislocation. Degenerative joint changes of the left shoulder noted. Soft tissues are unremarkable. IMPRESSION: No acute fracture or dislocation. Electronically Signed   By: Abelardo Diesel M.D.   On: 10/10/2018 01:46   Dg Hip Unilat W Or Wo Pelvis 2-3 Views Left  Result Date: 10/10/2018 CLINICAL DATA:  Status post fall with left hip pain. EXAM: DG HIP (WITH OR WITHOUT PELVIS) 2-3V LEFT COMPARISON:  None. FINDINGS: There is no evidence of hip fracture or dislocation. Degenerative joint changes of bilateral hips with narrowed joint space and osteophyte formation are noted. IMPRESSION: No acute fracture or dislocation. Electronically Signed   By: Abelardo Diesel M.D.   On: 10/10/2018 01:47    Assessment/Plan 1. Irritant contact dermatitis, unspecified trigger -will treat with triamcinolone cream bid until resolved  -he also has had candida and is getting mycolog cream to his buttocks/scrotal area which may be continued if this persists   Family/ staff Communication: discussed with rehab nurse who helped me to evaluate area and bedside caregiver  Labs/tests ordered:  No new  Hser Belanger L. Yisroel Mullendore, D.O. [facility] [address], [place] [address] Cell Phone (Mon-Fri 8am-5pm):  [phone] On Call:  [phone] & follow prompts after 5pm & weekends Office Phone:  [phone] Office Fax:  [phone]

## 2018-11-04 ENCOUNTER — Ambulatory Visit: Payer: Medicare Other | Admitting: Internal Medicine

## 2018-11-04 DIAGNOSIS — R2689 Other abnormalities of gait and mobility: Secondary | ICD-10-CM | POA: Diagnosis not present

## 2018-11-04 DIAGNOSIS — M62561 Muscle wasting and atrophy, not elsewhere classified, right lower leg: Secondary | ICD-10-CM | POA: Diagnosis not present

## 2018-11-04 DIAGNOSIS — M25511 Pain in right shoulder: Secondary | ICD-10-CM | POA: Diagnosis not present

## 2018-11-04 DIAGNOSIS — M6389 Disorders of muscle in diseases classified elsewhere, multiple sites: Secondary | ICD-10-CM | POA: Diagnosis not present

## 2018-11-04 DIAGNOSIS — N3946 Mixed incontinence: Secondary | ICD-10-CM | POA: Diagnosis not present

## 2018-11-04 DIAGNOSIS — M62562 Muscle wasting and atrophy, not elsewhere classified, left lower leg: Secondary | ICD-10-CM | POA: Diagnosis not present

## 2018-11-05 ENCOUNTER — Encounter: Payer: Self-pay | Admitting: Adult Health

## 2018-11-05 ENCOUNTER — Non-Acute Institutional Stay (SKILLED_NURSING_FACILITY): Payer: Medicare Other | Admitting: Adult Health

## 2018-11-05 DIAGNOSIS — H6123 Impacted cerumen, bilateral: Secondary | ICD-10-CM | POA: Diagnosis not present

## 2018-11-05 DIAGNOSIS — M62561 Muscle wasting and atrophy, not elsewhere classified, right lower leg: Secondary | ICD-10-CM | POA: Diagnosis not present

## 2018-11-05 DIAGNOSIS — M25511 Pain in right shoulder: Secondary | ICD-10-CM | POA: Diagnosis not present

## 2018-11-05 DIAGNOSIS — F0150 Vascular dementia without behavioral disturbance: Secondary | ICD-10-CM | POA: Diagnosis not present

## 2018-11-05 DIAGNOSIS — I5022 Chronic systolic (congestive) heart failure: Secondary | ICD-10-CM | POA: Diagnosis not present

## 2018-11-05 DIAGNOSIS — M6389 Disorders of muscle in diseases classified elsewhere, multiple sites: Secondary | ICD-10-CM | POA: Diagnosis not present

## 2018-11-05 DIAGNOSIS — M62562 Muscle wasting and atrophy, not elsewhere classified, left lower leg: Secondary | ICD-10-CM | POA: Diagnosis not present

## 2018-11-05 DIAGNOSIS — N3946 Mixed incontinence: Secondary | ICD-10-CM | POA: Diagnosis not present

## 2018-11-05 DIAGNOSIS — R7881 Bacteremia: Secondary | ICD-10-CM

## 2018-11-05 DIAGNOSIS — R531 Weakness: Secondary | ICD-10-CM

## 2018-11-05 DIAGNOSIS — R2689 Other abnormalities of gait and mobility: Secondary | ICD-10-CM | POA: Diagnosis not present

## 2018-11-05 DIAGNOSIS — K5901 Slow transit constipation: Secondary | ICD-10-CM | POA: Diagnosis not present

## 2018-11-05 NOTE — Progress Notes (Signed)
Location:  Occupational psychologist of Service:  SNF (31) Provider:   Cindi Carbon, [place] [phone]   Tisovec, Fransico Him, MD  Patient Care Team: Tisovec, Fransico Him, MD as PCP - General (Internal Medicine) Sanda Klein, MD as PCP - Cardiology (Cardiology) Carolan Clines, MD (Inactive) as Consulting Physician (Urology)  Extended Emergency Contact Information Primary Emergency Contact: [facility] Address: [address]          [place], [place] [address] [name] of [place] Phone: [phone] Mobile Phone: [phone] Relation: Spouse Secondary Emergency Contact: Michail Sermon, [place] of [place] Phone: [phone] Mobile Phone: [phone] Relation: Daughter  Code Status:  Full code Goals of care: Advanced Directive information Advanced Directives 09/05/2018  Does Patient Have a Medical Advance Directive? No  Type of Advance Directive -  Does patient want to make changes to medical advance directive? -  Copy of [place] in Chart? -  Would patient like information on creating a medical advance directive? No - Patient declined  Pre-existing out of facility DNR order (yellow form or pink MOST form) -     Chief Complaint  Patient presents with  . Medical Management of Chronic Issues    HPI:  Pt is a 85 y.o. male seen today for medical management of chronic diseases.  He resides in skilled rehab after a hospitalizations 09/04/18-09/21/18 due to sepsis due to an infected defibrillator.  Blood cultures returned with ESBL (klebsiella) bacteremia.  TEE on 11/26 showed possible vegetation of pacemaker lead and endocarditis.  ICD system with debrillator lead was removed 09/15/18. He received Invanz for 6 weeks and completed therapy on 10/27/2018.  He has not had any chest pain, sob, fever, etc.   Systolic CHF: with hx of AS and TAVR,  Wt Readings from Last 3  Encounters:  11/05/18 210 lb 8 oz (95.5 kg)  11/03/18 211 lb (95.7 kg)  09/22/18 215 lb (97.5 kg)  No issues with weight gain or sob. Has chronic edema in both feet.   He is making slow progress in therapy and walking very short distances with assistance. Otherwise he is using the hoyer lift for transfers to the chair.  He was taking miralax 17 grams daily but due to loose stools this was reduced to 1 tsp and this has improved, although he remains incontinent of stool. Stool cultures on 1/15 were neg.  He fell out of bed on 1/22 and had part of his body leaning on the bed rail and the other part on a pad on the floor. He reported chest soreness afterward but denies that at this time.  He is hard of hearing and has underlying vascular dementia. He is has lacked motivation and had trouble following commands at times but seems to be getting better.   Past Medical History:  Diagnosis Date  . AAA (abdominal aortic aneurysm) ([place])    7/13 3.8cm  . Arthritis    "joints" (01/11/2014)  . Asthma    "seasonal; some foods"   . Chronic systolic CHF (congestive heart failure) ([place])   . Dementia ([place])   . HLD (hyperlipidemia)   . HOH (hard of hearing)   . Lumbar vertebral fracture ([facility])    L1- 04/11/2014   . Prostate cancer ([place])   . S/P ICD (internal cardiac defibrillator) procedure, 01/11/14 removal of ERI gen and placement of Medtronic Evera XT VR & NEW Right  ventricular lead Medtronic 01/12/2014  . S/P TAVR (transcatheter aortic valve replacement)    Edwards Sapien 3 THV (size 29 mm, model # [id number], serial # [id number])  . Severe aortic stenosis   . Sleep apnea    "lost 60# & don't have it anymore" (01/11/2014)  . Ventricular tachycardia [facility])    Past Surgical History:  Procedure Laterality Date  . CARDIAC CATHETERIZATION  08/20/2004   noncritical CAD,mild global hypokinesis, EF 50%  . CARDIAC DEFIBRILLATOR PLACEMENT  08/23/2004   Medtronic  . CATARACT EXTRACTION W/ INTRAOCULAR LENS IMPLANT  Right   . COLECTOMY  1990's  . CRYOABLATION N/A 02/24/2014   Procedure: CRYO ABLATION PROSTATE;  Surgeon: Ailene Rud, MD;  Location: [facility] ORS;  Service: Urology;  Laterality: N/A;  . HERNIA REPAIR     "abdomen; from colon OR"  . ICD LEAD REMOVAL N/A 09/15/2018   Procedure: ICD LEAD REMOVAL EXTRACTION;  Surgeon: Evans Lance, MD;  Location: [facility];  Service: Cardiovascular;  Laterality: N/A;  DR. BARTLE TO BACK UP  . IMPLANTABLE CARDIOVERTER DEFIBRILLATOR (ICD) GENERATOR CHANGE N/A 01/11/2014   Procedure: ICD GENERATOR CHANGE;  Surgeon: Sanda Klein, MD;  Location: [place] CATH LAB;  Service: Cardiovascular;  Laterality: N/A;  . LEAD REVISION N/A 01/11/2014   Procedure: LEAD REVISION;  Surgeon: Sanda Klein, MD;  Location: [place] CATH LAB;  Service: Cardiovascular;  Laterality: N/A;  . NM MYOCAR PERF WALL MOTION  01/29/2012   abnormal c/o infarct/scar,no ischemia present  . PROSTATE BIOPSY N/A 11/22/2013   Procedure: PROSTATE BIOPSY AND ULTRASOUND;  Surgeon: Ailene Rud, MD;  Location: [facility];  Service: Urology;  Laterality: N/A;  . RIGHT/LEFT HEART CATH AND CORONARY ANGIOGRAPHY N/A 07/06/2018   Procedure: RIGHT/LEFT HEART CATH AND CORONARY ANGIOGRAPHY;  Surgeon: Troy Sine, MD;  Location: [place] CV LAB;  Service: Cardiovascular;  Laterality: N/A;  . TEE WITHOUT CARDIOVERSION N/A 08/18/2018   Procedure: TRANSESOPHAGEAL ECHOCARDIOGRAM (TEE);  Surgeon: Burnell Blanks, MD;  Location: [place];  Service: Open Heart Surgery;  Laterality: N/A;  . TEE WITHOUT CARDIOVERSION N/A 09/08/2018   Procedure: TRANSESOPHAGEAL ECHOCARDIOGRAM (TEE);  Surgeon: Lelon Perla, MD;  Location: [facility] ENDOSCOPY;  Service: Cardiovascular;  Laterality: N/A;  . TEE WITHOUT CARDIOVERSION N/A 09/15/2018   Procedure: TRANSESOPHAGEAL ECHOCARDIOGRAM (TEE);  Surgeon: Evans Lance, MD;  Location: [facility];  Service: Cardiovascular;  Laterality: N/A;  . TRANSCATHETER AORTIC VALVE REPLACEMENT, TRANSFEMORAL   08/18/2018  . TRANSCATHETER AORTIC VALVE REPLACEMENT, TRANSFEMORAL N/A 08/18/2018   Procedure: TRANSCATHETER AORTIC VALVE REPLACEMENT, TRANSFEMORAL using an Edwards 27mm Aortic Valve;  Surgeon: Burnell Blanks, MD;  Location: [place];  Service: Open Heart Surgery;  Laterality: N/A;    Allergies  Allergen Reactions  . Crestor [Rosuvastatin] Other (See Comments)    Hurts muscles  . Lipitor [Atorvastatin] Other (See Comments)    Hurts stomach  . Shrimp [Shellfish Allergy] Other (See Comments)    On MAR    Outpatient Encounter Medications as of 11/05/2018  Medication Sig  . acetaminophen (TYLENOL) 500 MG tablet Take 500 mg by mouth every 6 (six) hours as needed (for pain).   Marland Kitchen amiodarone (PACERONE) 200 MG tablet TAKE 1/2 TABLET BY MOUTH EVERY DAY  . aspirin 81 MG chewable tablet Chew 1 tablet (81 mg total) by mouth daily.  . cholecalciferol (VITAMIN D) 1000 units tablet Take 2,000 Units by mouth at bedtime.  . clopidogrel (PLAVIX) 75 MG tablet Take 1 tablet (75 mg total) by mouth daily with breakfast.  .  ezetimibe-simvastatin (VYTORIN) 10-20 MG per tablet Take 1 tablet by mouth at bedtime.  . metoprolol succinate (TOPROL-XL) 25 MG 24 hr tablet Take 1 tablet (25 mg total) by mouth daily. Take with or immediately following a meal.  . Multiple Vitamins-Minerals (PRESERVISION AREDS 2 PO) Take 1 capsule by mouth daily.   . polyethylene glycol (MIRALAX / GLYCOLAX) packet Take 17 g by mouth every evening. (Patient taking differently: Take 1 g by mouth every evening. 1 teaspoon daily)  . triamcinolone (KENALOG) 0.025 % cream Apply 1 application topically 2 (two) times daily. Until resolved   No facility-administered encounter medications on file as of 11/05/2018.     Review of Systems  Unable to perform ROS: Dementia    Immunization History  Administered Date(s) Administered  . Influenza, High Dose Seasonal PF 08/13/2017, 07/03/2018  . Influenza,inj,Quad PF,6+ Mos 08/18/2013, 07/23/2016    . Pneumococcal Conjugate-13 08/14/2013  . Pneumococcal Polysaccharide-23 04/06/2009   Pertinent  Health Maintenance Due  Topic Date Due  . INFLUENZA VACCINE  Completed  . PNA vac Low Risk Adult  Completed   No flowsheet data found. Functional Status Survey:    Vitals:   11/05/18 1550  Weight: 210 lb 8 oz (95.5 kg)   Body mass index is 30.2 kg/m. Physical Exam Vitals signs and nursing note reviewed.  Constitutional:      General: He is not in acute distress.    Appearance: He is not diaphoretic.  HENT:     Head: Normocephalic and atraumatic.     Nose: Rhinorrhea present.     Mouth/Throat:     Pharynx: No oropharyngeal exudate or posterior oropharyngeal erythema.  Eyes:     General:        Right eye: No discharge.        Left eye: No discharge.     Conjunctiva/sclera: Conjunctivae normal.     Pupils: Pupils are equal, round, and reactive to light.  Neck:     Thyroid: No thyromegaly.     Vascular: No JVD.     Trachea: No tracheal deviation.  Cardiovascular:     Rate and Rhythm: Normal rate and regular rhythm.     Heart sounds: No murmur.  Pulmonary:     Effort: Pulmonary effort is normal. No respiratory distress.     Breath sounds: Normal breath sounds. No stridor. No wheezing, rhonchi or rales.     Comments: No pain upon taking a deep breath Chest:     Chest wall: No tenderness.  Abdominal:     General: Bowel sounds are normal. There is no distension.     Palpations: Abdomen is soft.     Tenderness: There is no abdominal tenderness.  Lymphadenopathy:     Cervical: No cervical adenopathy.  Skin:    General: Skin is warm and dry.     Comments: Ecchymoses to BUE  Neurological:     General: No focal deficit present.     Mental Status: He is alert.     Cranial Nerves: No cranial nerve deficit.     Comments: Oriented to self only.   Psychiatric:        Mood and Affect: Mood normal.     Labs reviewed: Recent Labs    08/07/18 0512 08/07/18 1411   09/08/18 0719 09/09/18 0826  09/15/18 0343  09/17/18 0332 09/18/18 0850 09/21/18 0413 10/05/18 0500  NA 134* 138   < > 135 132*   < > 133*   < > 132* 135 137  137  K 3.6 3.2*   < > 3.5 3.7   < > 3.7   < > 3.6 3.5 3.6 4.2  CL 100 107   < > 105 103   < > 101   < > 99 102 103  --   CO2 26 25   < > 23 21*   < > 24   < > 24 24 25   --   GLUCOSE 164* 102*   < > 129* 126*   < > 155*   < > 121* 171* 130*  --   BUN 16 12   < > 12 13   < > 17   < > 16 14 19 18   CREATININE 1.30* 1.16   < > 0.95 0.92   < > 1.01   < > 0.77 0.88 0.81 0.8  CALCIUM 8.8* 8.2*   < > 8.1* 8.2*   < > 8.1*   < > 7.7* 7.8* 7.8*  --   MG 2.2 2.2   < > 2.3 2.2  --  2.4  --   --   --   --   --   PHOS 3.6 3.3  --   --   --   --   --   --   --   --   --   --    < > = values in this interval not displayed.   Recent Labs    09/07/18 0303 09/08/18 0719 09/09/18 0826  AST 34 28 26  ALT 66* 47* 42  ALKPHOS 203* 175* 155*  BILITOT 1.2 1.2 1.4*  PROT 5.7* 5.7* 5.6*  ALBUMIN 2.5* 2.4* 2.5*   Recent Labs    09/18/18 0850 09/21/18 0413 10/05/18 0500 10/28/18 1629  WBC 6.3 5.5 8.2 6.3  NEUTROABS 4.6 2.9  --  4,082  HGB 9.9* 9.7* 11.7* 11.1*  HCT 29.0* 28.5* 33* 32.8*  MCV 96.3 97.3  --  96.2  PLT 129* 144* 158 131*   Lab Results  Component Value Date   TSH 1.75 02/05/2017   Lab Results  Component Value Date   HGBA1C 5.2 08/13/2018   No results found for: CHOL, HDL, LDLCALC, LDLDIRECT, TRIG, CHOLHDL  Significant Diagnostic Results in last 30 days:  Ct Head Wo Contrast  Result Date: 10/09/2018 CLINICAL DATA:  Fall while on anti coagulation therapy. EXAM: CT HEAD WITHOUT CONTRAST CT CERVICAL SPINE WITHOUT CONTRAST TECHNIQUE: Multidetector CT imaging of the head and cervical spine was performed following the standard protocol without intravenous contrast. Multiplanar CT image reconstructions of the cervical spine were also generated. COMPARISON:  Head CT 04/12/2013 FINDINGS: CT HEAD FINDINGS Brain: There is no mass,  hemorrhage or extra-axial collection. There is generalized atrophy without lobar predilection. There is hypoattenuation of the periventricular white matter, most commonly indicating chronic ischemic microangiopathy. Vascular: Atherosclerotic calcification of the vertebral and internal carotid arteries at the skull base. No abnormal hyperdensity of the major intracranial arteries or dural venous sinuses. There is dolichoectasia of the basilar artery, unchanged. Skull: Left frontal scalp laceration.  No skull fracture. Sinuses/Orbits: No fluid levels or advanced mucosal thickening of the visualized paranasal sinuses. No mastoid or middle ear effusion. The orbits are normal. CT CERVICAL SPINE FINDINGS Alignment: No static subluxation. Facets are aligned. Occipital condyles are normally positioned. Skull base and vertebrae: No acute fracture. Soft tissues and spinal canal: No prevertebral fluid or swelling. No visible canal hematoma. Disc levels: Facet hypertrophy is worst at right C2-3 and left  C3-4. No bony spinal canal stenosis. Upper chest: No pneumothorax, pulmonary nodule or pleural effusion. Other: Normal visualized paraspinal cervical soft tissues. IMPRESSION: 1. No acute intracranial abnormality. 2. Left frontal scalp laceration without skull fracture. 3. No acute fracture or static subluxation of the cervical spine. 4. Chronic ischemic microangiopathy and basilar dolichoectasia. Electronically Signed   By: Ulyses Jarred M.D.   On: 10/09/2018 23:50   Ct Cervical Spine Wo Contrast  Result Date: 10/09/2018 CLINICAL DATA:  Fall while on anti coagulation therapy. EXAM: CT HEAD WITHOUT CONTRAST CT CERVICAL SPINE WITHOUT CONTRAST TECHNIQUE: Multidetector CT imaging of the head and cervical spine was performed following the standard protocol without intravenous contrast. Multiplanar CT image reconstructions of the cervical spine were also generated. COMPARISON:  Head CT 04/12/2013 FINDINGS: CT HEAD FINDINGS  Brain: There is no mass, hemorrhage or extra-axial collection. There is generalized atrophy without lobar predilection. There is hypoattenuation of the periventricular white matter, most commonly indicating chronic ischemic microangiopathy. Vascular: Atherosclerotic calcification of the vertebral and internal carotid arteries at the skull base. No abnormal hyperdensity of the major intracranial arteries or dural venous sinuses. There is dolichoectasia of the basilar artery, unchanged. Skull: Left frontal scalp laceration.  No skull fracture. Sinuses/Orbits: No fluid levels or advanced mucosal thickening of the visualized paranasal sinuses. No mastoid or middle ear effusion. The orbits are normal. CT CERVICAL SPINE FINDINGS Alignment: No static subluxation. Facets are aligned. Occipital condyles are normally positioned. Skull base and vertebrae: No acute fracture. Soft tissues and spinal canal: No prevertebral fluid or swelling. No visible canal hematoma. Disc levels: Facet hypertrophy is worst at right C2-3 and left C3-4. No bony spinal canal stenosis. Upper chest: No pneumothorax, pulmonary nodule or pleural effusion. Other: Normal visualized paraspinal cervical soft tissues. IMPRESSION: 1. No acute intracranial abnormality. 2. Left frontal scalp laceration without skull fracture. 3. No acute fracture or static subluxation of the cervical spine. 4. Chronic ischemic microangiopathy and basilar dolichoectasia. Electronically Signed   By: Ulyses Jarred M.D.   On: 10/09/2018 23:50   Dg Shoulder Left  Result Date: 10/10/2018 CLINICAL DATA:  Status post fall with left shoulder pain. EXAM: LEFT SHOULDER - 2+ VIEW COMPARISON:  None. FINDINGS: There is no evidence of fracture or dislocation. Degenerative joint changes of the left shoulder noted. Soft tissues are unremarkable. IMPRESSION: No acute fracture or dislocation. Electronically Signed   By: Abelardo Diesel M.D.   On: 10/10/2018 01:46   Dg Hip Unilat W Or Wo  Pelvis 2-3 Views Left  Result Date: 10/10/2018 CLINICAL DATA:  Status post fall with left hip pain. EXAM: DG HIP (WITH OR WITHOUT PELVIS) 2-3V LEFT COMPARISON:  None. FINDINGS: There is no evidence of hip fracture or dislocation. Degenerative joint changes of bilateral hips with narrowed joint space and osteophyte formation are noted. IMPRESSION: No acute fracture or dislocation. Electronically Signed   By: Abelardo Diesel M.D.   On: 10/10/2018 01:47    Assessment/Plan 1. Weakness acquired in ICU He is making very slow progress after a prolonged illness with loss of muscle mass. Due to his underlying dementia he has been difficult to motivate and the staff have had difficulty getting him to follow commands for therapy. He is beginning to walk very short distances and the hope is that he will continue to progress and go home in the future with home care support.   2. Bacteremia due to Klebsiella pneumoniae ICD removal due to vegetation  Has completed antibiotic therapy with no fevers  or other issues ID recommended avoidance of foley or condom caths, along with avoidance of dental procedures. Amoxicillin was suggested by cardiology SBE prophylaxis but dental procedures will hopefully be avoided.   3. Vascular dementia without behavioral disturbance ([place]) He is on plavix for 6 months His underlying cognitive issues have contributed to his slow progress.   4. Chronic systolic CHF (congestive heart failure) ([place]) He appears compensated and has not needed any lasix at this time. Followed by cardiology. Continue to monitor weights.   5. Bilateral impacted cerumen Initiate cerumen protocol to both ears   6. Constipation Continue 1 tsp of miralax daily  Family/ staff Communication: discussed with nurse  Labs/tests ordered:  NA

## 2018-11-06 ENCOUNTER — Encounter: Payer: Self-pay | Admitting: Cardiovascular Disease

## 2018-11-06 ENCOUNTER — Non-Acute Institutional Stay (SKILLED_NURSING_FACILITY): Payer: Medicare Other | Admitting: Adult Health

## 2018-11-06 ENCOUNTER — Encounter: Payer: Self-pay | Admitting: Adult Health

## 2018-11-06 ENCOUNTER — Telehealth: Payer: Self-pay | Admitting: Cardiovascular Disease

## 2018-11-06 DIAGNOSIS — M62561 Muscle wasting and atrophy, not elsewhere classified, right lower leg: Secondary | ICD-10-CM | POA: Diagnosis not present

## 2018-11-06 DIAGNOSIS — N3946 Mixed incontinence: Secondary | ICD-10-CM | POA: Diagnosis not present

## 2018-11-06 DIAGNOSIS — R071 Chest pain on breathing: Secondary | ICD-10-CM

## 2018-11-06 DIAGNOSIS — M25511 Pain in right shoulder: Secondary | ICD-10-CM | POA: Diagnosis not present

## 2018-11-06 DIAGNOSIS — M62562 Muscle wasting and atrophy, not elsewhere classified, left lower leg: Secondary | ICD-10-CM | POA: Diagnosis not present

## 2018-11-06 DIAGNOSIS — R2689 Other abnormalities of gait and mobility: Secondary | ICD-10-CM | POA: Diagnosis not present

## 2018-11-06 DIAGNOSIS — M25531 Pain in right wrist: Secondary | ICD-10-CM | POA: Diagnosis not present

## 2018-11-06 DIAGNOSIS — R079 Chest pain, unspecified: Secondary | ICD-10-CM | POA: Diagnosis not present

## 2018-11-06 DIAGNOSIS — M6389 Disorders of muscle in diseases classified elsewhere, multiple sites: Secondary | ICD-10-CM | POA: Diagnosis not present

## 2018-11-06 NOTE — Telephone Encounter (Signed)
ECG reviewed by dr croitoru, no change from baseline and the patient does not have CAD. No need for cardiac workup at the time.

## 2018-11-06 NOTE — Telephone Encounter (Signed)
Fax number given for ECG to be faxed.

## 2018-11-06 NOTE — Progress Notes (Signed)
Location:  Occupational psychologist of Service:  SNF (31) Provider:   Cindi Carbon, [place] [phone]   Tisovec, Fransico Him, MD  Patient Care Team: Tisovec, Fransico Him, MD as PCP - General (Internal Medicine) Sanda Klein, MD as PCP - Cardiology (Cardiology) Carolan Clines, MD (Inactive) as Consulting Physician (Urology)  Extended Emergency Contact Information Primary Emergency Contact: [facility] Address: [address]          [place], [place] [address] [name] of [place] Phone: [phone] Mobile Phone: [phone] Relation: Spouse Secondary Emergency Contact: Michail Sermon, [place] of [place] Phone: [phone] Mobile Phone: [phone] Relation: Daughter  Code Status:  Full code Goals of care: Advanced Directive information Advanced Directives 09/05/2018  Does Patient Have a Medical Advance Directive? No  Type of Advance Directive -  Does patient want to make changes to medical advance directive? -  Copy of [place] in Chart? -  Would patient like information on creating a medical advance directive? No - Patient declined  Pre-existing out of facility DNR order (yellow form or pink MOST form) -     Chief Complaint  Patient presents with  . Acute Visit    chest pain and right arm pain    HPI:  Pt is a 85 y.o. male seen today for right wrist pain and chest pain.   Background: He resides in skilled rehab after a hospitalizations 09/04/18-09/21/18 due to sepsis due to an infected defibrillator.  Blood cultures returned with ESBL (klebsiella) bacteremia.  TEE on 11/26 showed possible vegetation of pacemaker lead and endocarditis but no infection of aortic valve replacement was identified.  ICD system with debrillator lead was removed 09/15/18. He received Invanz for 6 weeks and completed therapy on 10/27/2018.   He fell out of bed on 1/22 and had part  of his body leaning on the bed rail and the other part on a pad on the floor.  11/06/2018   Today he reported pain in the right of sternum area, the right wrist, and right arm present at rest. He has underlying dementia and was not able to provide a good history. He did report some soreness to the chest on 1/23. EKG showed not acute ST elevation with sinus rhythm, first degree AV block, left anterior hemiblock, QT prolongation, and poor r wave progression. No change from previous readings. He is not diaphoretic or short of breath. He received one nitro and feels better but still has pain when taking a deep breath at the right of the sternum.  In regards to his wrist, he had a fracture in 2019 and previously wore a splint but has not been using it. He did not require surgery and was followed by ortho.   Past Medical History:  Diagnosis Date  . AAA (abdominal aortic aneurysm) ([place])    7/13 3.8cm  . Arthritis    "joints" (01/11/2014)  . Asthma    "seasonal; some foods"   . Chronic systolic CHF (congestive heart failure) ([place])   . Dementia ([place])   . HLD (hyperlipidemia)   . HOH (hard of hearing)   . Lumbar vertebral fracture (H[facility])    L1- 04/11/2014   . Prostate cancer ([place])   . S/P ICD (internal cardiac defibrillator) procedure, 01/11/14 removal of ERI gen and placement of Medtronic Evera XT VR & NEW Right ventricular lead Medtronic 01/12/2014  . S/P TAVR (transcatheter  aortic valve replacement)    Edwards Sapien 3 THV (size 29 mm, model # [id number], serial # [id number])  . Severe aortic stenosis   . Sleep apnea    "lost 60# & don't have it anymore" (01/11/2014)  . Ventricular tachycardia [facility])    Past Surgical History:  Procedure Laterality Date  . CARDIAC CATHETERIZATION  08/20/2004   noncritical CAD,mild global hypokinesis, EF 50%  . CARDIAC DEFIBRILLATOR PLACEMENT  08/23/2004   Medtronic  . CATARACT EXTRACTION W/ INTRAOCULAR LENS IMPLANT Right   . COLECTOMY  1990's  . CRYOABLATION N/A  02/24/2014   Procedure: CRYO ABLATION PROSTATE;  Surgeon: Ailene Rud, MD;  Location: [facility] ORS;  Service: Urology;  Laterality: N/A;  . HERNIA REPAIR     "abdomen; from colon OR"  . ICD LEAD REMOVAL N/A 09/15/2018   Procedure: ICD LEAD REMOVAL EXTRACTION;  Surgeon: Evans Lance, MD;  Location[facility] OR;  Service: Cardiovascular;  Laterality: N/A;  DR. BARTLE TO BACK UP  . IMPLANTABLE CARDIOVERTER DEFIBRILLATOR (ICD) GENERATOR CHANGE N/A 01/11/2014   Procedure: ICD GENERATOR CHANGE;  Surgeon: Sanda Klein, MD;  Location: [place] CATH LAB;  Service: Cardiovascular;  Laterality: N/A;  . LEAD REVISION N/A 01/11/2014   Procedure: LEAD REVISION;  Surgeon: Sanda Klein, MD;  Location: [place] CATH LAB;  Service: Cardiovascular;  Laterality: N/A;  . NM MYOCAR PERF WALL MOTION  01/29/2012   abnormal c/o infarct/scar,no ischemia present  . PROSTATE BIOPSY N/A 11/22/2013   Procedure: PROSTATE BIOPSY AND ULTRASOUND;  Surgeon: Ailene Rud, MD;  Location: [facility]S;  Service: Urology;  Laterality: N/A;  . RIGHT/LEFT HEART CATH AND CORONARY ANGIOGRAPHY N/A 07/06/2018   Procedure: RIGHT/LEFT HEART CATH AND CORONARY ANGIOGRAPHY;  Surgeon: Troy Sine, MD;  Location: [facility] CV LAB;  Service: Cardiovascular;  Laterality: N/A;  . TEE WITHOUT CARDIOVERSION N/A 08/18/2018   Procedure: TRANSESOPHAGEAL ECHOCARDIOGRAM (TEE);  Surgeon: Burnell Blanks, MD;  Location: [place];  Service: Open Heart Surgery;  Laterality: N/A;  . TEE WITHOUT CARDIOVERSION N/A 09/08/2018   Procedure: TRANSESOPHAGEAL ECHOCARDIOGRAM (TEE);  Surgeon: Lelon Perla, MD;  Location[facility] ENDOSCOPY;  Service: Cardiovascular;  Laterality: N/A;  . TEE WITHOUT CARDIOVERSION N/A 09/15/2018   Procedure: TRANSESOPHAGEAL ECHOCARDIOGRAM (TEE);  Surgeon: Evans Lance, MD;  Location: [facility];  Service: Cardiovascular;  Laterality: N/A;  . TRANSCATHETER AORTIC VALVE REPLACEMENT, TRANSFEMORAL  08/18/2018  . TRANSCATHETER AORTIC VALVE  REPLACEMENT, TRANSFEMORAL N/A 08/18/2018   Procedure: TRANSCATHETER AORTIC VALVE REPLACEMENT, TRANSFEMORAL using an Edwards 66mm Aortic Valve;  Surgeon: Burnell Blanks, MD;  Location: [place];  Service: Open Heart Surgery;  Laterality: N/A;    Allergies  Allergen Reactions  . Crestor [Rosuvastatin] Other (See Comments)    Hurts muscles  . Lipitor [Atorvastatin] Other (See Comments)    Hurts stomach  . Shrimp [Shellfish Allergy] Other (See Comments)    On MAR    Outpatient Encounter Medications as of 11/06/2018  Medication Sig  . acetaminophen (TYLENOL) 500 MG tablet Take 500 mg by mouth every 6 (six) hours as needed (for pain).   Marland Kitchen amiodarone (PACERONE) 200 MG tablet TAKE 1/2 TABLET BY MOUTH EVERY DAY  . aspirin 81 MG chewable tablet Chew 1 tablet (81 mg total) by mouth daily.  . cholecalciferol (VITAMIN D) 1000 units tablet Take 2,000 Units by mouth at bedtime.  . clopidogrel (PLAVIX) 75 MG tablet Take 1 tablet (75 mg total) by mouth daily with breakfast.  . ezetimibe-simvastatin (VYTORIN) 10-20 MG per tablet Take  1 tablet by mouth at bedtime.  . metoprolol succinate (TOPROL-XL) 25 MG 24 hr tablet Take 1 tablet (25 mg total) by mouth daily. Take with or immediately following a meal.  . Multiple Vitamins-Minerals (PRESERVISION AREDS 2 PO) Take 1 capsule by mouth daily.   . polyethylene glycol (MIRALAX / GLYCOLAX) packet Take 17 g by mouth every evening. (Patient taking differently: Take 1 g by mouth every evening. 1 teaspoon daily)  . triamcinolone (KENALOG) 0.025 % cream Apply 1 application topically 2 (two) times daily. Until resolved   No facility-administered encounter medications on file as of 11/06/2018.     Review of Systems  Unable to perform ROS: Dementia    Immunization History  Administered Date(s) Administered  . Influenza, High Dose Seasonal PF 08/13/2017, 07/03/2018  . Influenza,inj,Quad PF,6+ Mos 08/18/2013, 07/23/2016  . Pneumococcal Conjugate-13 08/14/2013    . Pneumococcal Polysaccharide-23 04/06/2009   Pertinent  Health Maintenance Due  Topic Date Due  . INFLUENZA VACCINE  Completed  . PNA vac Low Risk Adult  Completed   No flowsheet data found. Functional Status Survey:    Vitals:   11/06/18 1414  BP: 116/76  Pulse: 79  Resp: 19  Temp: 98.1 F (36.7 C)  SpO2: 94%   There is no height or weight on file to calculate BMI. Physical Exam Vitals signs and nursing note reviewed.  Constitutional:      General: He is not in acute distress.    Appearance: He is not diaphoretic.  HENT:     Head: Normocephalic and atraumatic.     Nose: Rhinorrhea present.     Mouth/Throat:     Pharynx: No oropharyngeal exudate or posterior oropharyngeal erythema.  Eyes:     General:        Right eye: No discharge.        Left eye: No discharge.     Conjunctiva/sclera: Conjunctivae normal.     Pupils: Pupils are equal, round, and reactive to light.  Neck:     Thyroid: No thyromegaly.     Vascular: No JVD.     Trachea: No tracheal deviation.  Cardiovascular:     Rate and Rhythm: Normal rate and regular rhythm.     Heart sounds: No murmur.  Pulmonary:     Effort: Pulmonary effort is normal. No respiratory distress.     Breath sounds: Normal breath sounds. No stridor. No wheezing, rhonchi or rales.     Comments: Pain upon taking a deep breath to the right of the sternum Chest:     Chest wall: No tenderness.  Abdominal:     General: Bowel sounds are normal. There is no distension.     Palpations: Abdomen is soft.     Tenderness: There is no abdominal tenderness.  Musculoskeletal:     Right wrist: He exhibits normal range of motion, no tenderness, no bony tenderness, no swelling, no effusion, no crepitus, no deformity and no laceration.     Right lower leg: Edema present.     Left lower leg: Edema present.  Lymphadenopathy:     Cervical: No cervical adenopathy.  Skin:    General: Skin is warm and dry.     Comments: Ecchymoses to BUE   Neurological:     General: No focal deficit present.     Mental Status: He is alert.     Cranial Nerves: No cranial nerve deficit.     Comments: Oriented to self only.   Psychiatric:  Mood and Affect: Mood normal.     Labs reviewed: Recent Labs    08/07/18 0512 08/07/18 1411  09/08/18 0719 09/09/18 0826  09/15/18 0343  09/17/18 0332 09/18/18 0850 09/21/18 0413 10/05/18 0500  NA 134* 138   < > 135 132*   < > 133*   < > 132* 135 137 137  K 3.6 3.2*   < > 3.5 3.7   < > 3.7   < > 3.6 3.5 3.6 4.2  CL 100 107   < > 105 103   < > 101   < > 99 102 103  --   CO2 26 25   < > 23 21*   < > 24   < > 24 24 25   --   GLUCOSE 164* 102*   < > 129* 126*   < > 155*   < > 121* 171* 130*  --   BUN 16 12   < > 12 13   < > 17   < > 16 14 19 18   CREATININE 1.30* 1.16   < > 0.95 0.92   < > 1.01   < > 0.77 0.88 0.81 0.8  CALCIUM 8.8* 8.2*   < > 8.1* 8.2*   < > 8.1*   < > 7.7* 7.8* 7.8*  --   MG 2.2 2.2   < > 2.3 2.2  --  2.4  --   --   --   --   --   PHOS 3.6 3.3  --   --   --   --   --   --   --   --   --   --    < > = values in this interval not displayed.   Recent Labs    09/07/18 0303 09/08/18 0719 09/09/18 0826  AST 34 28 26  ALT 66* 47* 42  ALKPHOS 203* 175* 155*  BILITOT 1.2 1.2 1.4*  PROT 5.7* 5.7* 5.6*  ALBUMIN 2.5* 2.4* 2.5*   Recent Labs    09/18/18 0850 09/21/18 0413 10/05/18 0500 10/28/18 1629  WBC 6.3 5.5 8.2 6.3  NEUTROABS 4.6 2.9  --  4,082  HGB 9.9* 9.7* 11.7* 11.1*  HCT 29.0* 28.5* 33* 32.8*  MCV 96.3 97.3  --  96.2  PLT 129* 144* 158 131*   Lab Results  Component Value Date   TSH 1.75 02/05/2017   Lab Results  Component Value Date   HGBA1C 5.2 08/13/2018   No results found for: CHOL, HDL, LDLCALC, LDLDIRECT, TRIG, CHOLHDL  Significant Diagnostic Results in last 30 days:  Ct Head Wo Contrast  Result Date: 10/09/2018 CLINICAL DATA:  Fall while on anti coagulation therapy. EXAM: CT HEAD WITHOUT CONTRAST CT CERVICAL SPINE WITHOUT CONTRAST  TECHNIQUE: Multidetector CT imaging of the head and cervical spine was performed following the standard protocol without intravenous contrast. Multiplanar CT image reconstructions of the cervical spine were also generated. COMPARISON:  Head CT 04/12/2013 FINDINGS: CT HEAD FINDINGS Brain: There is no mass, hemorrhage or extra-axial collection. There is generalized atrophy without lobar predilection. There is hypoattenuation of the periventricular white matter, most commonly indicating chronic ischemic microangiopathy. Vascular: Atherosclerotic calcification of the vertebral and internal carotid arteries at the skull base. No abnormal hyperdensity of the major intracranial arteries or dural venous sinuses. There is dolichoectasia of the basilar artery, unchanged. Skull: Left frontal scalp laceration.  No skull fracture. Sinuses/Orbits: No fluid levels or advanced mucosal thickening of the visualized paranasal  sinuses. No mastoid or middle ear effusion. The orbits are normal. CT CERVICAL SPINE FINDINGS Alignment: No static subluxation. Facets are aligned. Occipital condyles are normally positioned. Skull base and vertebrae: No acute fracture. Soft tissues and spinal canal: No prevertebral fluid or swelling. No visible canal hematoma. Disc levels: Facet hypertrophy is worst at right C2-3 and left C3-4. No bony spinal canal stenosis. Upper chest: No pneumothorax, pulmonary nodule or pleural effusion. Other: Normal visualized paraspinal cervical soft tissues. IMPRESSION: 1. No acute intracranial abnormality. 2. Left frontal scalp laceration without skull fracture. 3. No acute fracture or static subluxation of the cervical spine. 4. Chronic ischemic microangiopathy and basilar dolichoectasia. Electronically Signed   By: Ulyses Jarred M.D.   On: 10/09/2018 23:50   Ct Cervical Spine Wo Contrast  Result Date: 10/09/2018 CLINICAL DATA:  Fall while on anti coagulation therapy. EXAM: CT HEAD WITHOUT CONTRAST CT CERVICAL  SPINE WITHOUT CONTRAST TECHNIQUE: Multidetector CT imaging of the head and cervical spine was performed following the standard protocol without intravenous contrast. Multiplanar CT image reconstructions of the cervical spine were also generated. COMPARISON:  Head CT 04/12/2013 FINDINGS: CT HEAD FINDINGS Brain: There is no mass, hemorrhage or extra-axial collection. There is generalized atrophy without lobar predilection. There is hypoattenuation of the periventricular white matter, most commonly indicating chronic ischemic microangiopathy. Vascular: Atherosclerotic calcification of the vertebral and internal carotid arteries at the skull base. No abnormal hyperdensity of the major intracranial arteries or dural venous sinuses. There is dolichoectasia of the basilar artery, unchanged. Skull: Left frontal scalp laceration.  No skull fracture. Sinuses/Orbits: No fluid levels or advanced mucosal thickening of the visualized paranasal sinuses. No mastoid or middle ear effusion. The orbits are normal. CT CERVICAL SPINE FINDINGS Alignment: No static subluxation. Facets are aligned. Occipital condyles are normally positioned. Skull base and vertebrae: No acute fracture. Soft tissues and spinal canal: No prevertebral fluid or swelling. No visible canal hematoma. Disc levels: Facet hypertrophy is worst at right C2-3 and left C3-4. No bony spinal canal stenosis. Upper chest: No pneumothorax, pulmonary nodule or pleural effusion. Other: Normal visualized paraspinal cervical soft tissues. IMPRESSION: 1. No acute intracranial abnormality. 2. Left frontal scalp laceration without skull fracture. 3. No acute fracture or static subluxation of the cervical spine. 4. Chronic ischemic microangiopathy and basilar dolichoectasia. Electronically Signed   By: Ulyses Jarred M.D.   On: 10/09/2018 23:50   Dg Shoulder Left  Result Date: 10/10/2018 CLINICAL DATA:  Status post fall with left shoulder pain. EXAM: LEFT SHOULDER - 2+ VIEW  COMPARISON:  None. FINDINGS: There is no evidence of fracture or dislocation. Degenerative joint changes of the left shoulder noted. Soft tissues are unremarkable. IMPRESSION: No acute fracture or dislocation. Electronically Signed   By: Abelardo Diesel M.D.   On: 10/10/2018 01:46   Dg Hip Unilat W Or Wo Pelvis 2-3 Views Left  Result Date: 10/10/2018 CLINICAL DATA:  Status post fall with left hip pain. EXAM: DG HIP (WITH OR WITHOUT PELVIS) 2-3V LEFT COMPARISON:  None. FINDINGS: There is no evidence of hip fracture or dislocation. Degenerative joint changes of bilateral hips with narrowed joint space and osteophyte formation are noted. IMPRESSION: No acute fracture or dislocation. Electronically Signed   By: Abelardo Diesel M.D.   On: 10/10/2018 01:47    Assessment/Plan  1. Right wrist pain Will recheck xray Apply splint and use for comfort Tylenol 650 mg BID x 7 days  2. Chest pain on breathing EKG showed no ST changes No diaphoresis  or abnormal vitals  Symptoms at rest were relieved but pain still present after taking a deep breath.  Check CXR due to fall and possible chest injury as he fell on top off a side rail.  EKG was sent to Dr. Sallyanne Kuster and reviewed.   Family/ staff Communication: discussed with nurse and his wife  His wife would like to avoid unnecessary hospitalizations due to exposure to infections.  She was made aware that he is high risk for complication. We agreed to send him to the ER if this issue persist or does not improve but his symptoms appear to be related to a possible injury after a fall and she agreed with the above plan of care.   Labs/tests ordered:  NA

## 2018-11-06 NOTE — Telephone Encounter (Signed)
Heart cath 4 months ago without any significant stenoses. No ischemic ECG changes. S/P TAVR with low gradients across prosthesis. MCr

## 2018-11-06 NOTE — Telephone Encounter (Signed)
New message   Pt c/o of Chest Pain: STAT if CP now or developed within 24 hours  1. Are you having CP right now? No   2. Are you experiencing any other symptoms (ex. SOB, nausea, vomiting, sweating)? No   3. How long have you been experiencing CP? Since 11/05/2018  4. Is your CP continuous or coming and going? Coming and going   5. Have you taken Nitroglycerin? Yes   Per Steffanie Dunn has EKG results to give. ?

## 2018-11-07 DIAGNOSIS — R2689 Other abnormalities of gait and mobility: Secondary | ICD-10-CM | POA: Diagnosis not present

## 2018-11-07 DIAGNOSIS — M62561 Muscle wasting and atrophy, not elsewhere classified, right lower leg: Secondary | ICD-10-CM | POA: Diagnosis not present

## 2018-11-07 DIAGNOSIS — M6389 Disorders of muscle in diseases classified elsewhere, multiple sites: Secondary | ICD-10-CM | POA: Diagnosis not present

## 2018-11-07 DIAGNOSIS — M62562 Muscle wasting and atrophy, not elsewhere classified, left lower leg: Secondary | ICD-10-CM | POA: Diagnosis not present

## 2018-11-07 DIAGNOSIS — M25511 Pain in right shoulder: Secondary | ICD-10-CM | POA: Diagnosis not present

## 2018-11-07 DIAGNOSIS — N3946 Mixed incontinence: Secondary | ICD-10-CM | POA: Diagnosis not present

## 2018-11-08 NOTE — Progress Notes (Signed)
RFV: follow up for IV abtx for cardiac device infection Patient ID: William [name], male   DOB: [birth year], 85 y.o.   MRN: [id number]  HPI [name] is a 37TG M who was hospitalized from 11/22-12/9 for sepsis due to ESBL klebsiella bacteremia c/b vegetation to PPM leads but spared TAVR. He underwent extraction on 12/3 and was discharged on 6 wks of ertapenem which finished on 10/27/2018. He is looking forward to having his picc line removed. He subscribes to having some diarrhea but has noticed loss of appetite due to loss of taste. No plans for new implant of PPM per family report  Prior to these past few months, the patient was not know to have recurrent uti. Has referral for urology coming per family.  Family is concerned that he had such a quick decompensation that landed him in the hospital with sepsis  and ask what can be done from preventive aspect  Outpatient Encounter Medications as of 10/28/2018  Medication Sig  . acetaminophen (TYLENOL) 500 MG tablet Take 500 mg by mouth every 6 (six) hours as needed (for pain).   Marland Kitchen amiodarone (PACERONE) 200 MG tablet TAKE 1/2 TABLET BY MOUTH EVERY DAY  . clopidogrel (PLAVIX) 75 MG tablet Take 1 tablet (75 mg total) by mouth daily with breakfast.  . ezetimibe-simvastatin (VYTORIN) 10-20 MG per tablet Take 1 tablet by mouth at bedtime.  . Multiple Vitamins-Minerals (PRESERVISION AREDS 2 PO) Take 1 capsule by mouth daily.   . [DISCONTINUED] amoxicillin (AMOXIL) 500 MG tablet Take 4 tablets (2,01m) one hour prior to dental visits.  .Marland Kitchenaspirin 81 MG chewable tablet Chew 1 tablet (81 mg total) by mouth daily.  . cholecalciferol (VITAMIN D) 1000 units tablet Take 2,000 Units by mouth at bedtime.  . [EXPIRED] ertapenem (INVANZ) IVPB Inject 1 g into the vein daily. Indication:  ESBL Klebsiella endocarditis  Last Day of Therapy:  10/27/18 Labs - Once weekly:  CBC/D and BMP, Labs - Every other week:  ESR and CRP  . metoprolol succinate (TOPROL-XL)  25 MG 24 hr tablet Take 1 tablet (25 mg total) by mouth daily. Take with or immediately following a meal.  . polyethylene glycol (MIRALAX / GLYCOLAX) packet Take 17 g by mouth every evening. (Patient taking differently: Take 1 g by mouth every evening. 1 teaspoon daily)  . [DISCONTINUED] clobetasol cream (TEMOVATE) 06.26% Apply 1 application topically 2 (two) times daily. To coccyx area and Right and Left inner buttocks   No facility-administered encounter medications on file as of 10/28/2018.      Patient Active Problem List   Diagnosis Date Noted  . Coronary artery disease involving native coronary artery of native heart without angina pectoris 09/22/2018  . Closed fracture of lower end of right ulna 09/22/2018  . Weakness acquired in ICU 09/22/2018  . Acute bacterial endocarditis   . Infected defibrillator ([facility]   . Bacteremia due to Klebsiella pneumoniae   . Sepsis ([facility] 09/04/2018  . S/P TAVR (transcatheter aortic valve replacement) 08/18/2018  . Ventricular tachycardia ([facility]   . Dementia ([facility]   . Chronic systolic CHF (congestive heart failure) ([facility] 08/07/2018  . Severe aortic stenosis   . Acute on chronic systolic heart failure ([facility] 07/02/2018  . Prostate cancer ([facility] 01/20/2014  . S/P ICD (internal cardiac defibrillator) procedure, 01/11/14 removal of ERI gen and placement of Medtronic Evera XT VR & NEW Right ventricular lead Medtronic 01/12/2014  . Nonischemic cardiomyopathy ([facility] 05/28/2013  . Hyperlipidemia 05/28/2013  .  AAA (abdominal aortic aneurysm) ([place]) 05/28/2013     Health Maintenance Due  Topic Date Due  . TETANUS/TDAP  11/02/1946     Review of Systems 12 point ros is otherwise negative Physical Exam   BP 109/71   Pulse 90   Temp 97.7 F (36.5 C) (Oral)   Physical Exam  Constitutional: He is oriented to person, place, and time. He appears well-developed and well-nourished. No distress.  HENT:  Mouth/Throat: Oropharynx is clear and moist. No  oropharyngeal exudate.  Cardiovascular: Normal rate, regular rhythm and normal heart sounds. Exam reveals no gallop and no friction rub.  No murmur heard.  Pulmonary/Chest: Effort normal and breath sounds normal. No respiratory distress. He has no wheezes.  Abdominal: Soft. Bowel sounds are normal. He exhibits no distension. There is no tenderness.  Ext: picc line is c/d/i Neurological: He is alert and oriented to person, place, and time.  Skin: Skin is warm and dry. No rash noted. No erythema.  Psychiatric: He has a normal mood and affect. His behavior is normal.    CBC Lab Results  Component Value Date   WBC 6.3 10/28/2018   RBC 3.41 (L) 10/28/2018   HGB 11.1 (L) 10/28/2018   HCT 32.8 (L) 10/28/2018   PLT 131 (L) 10/28/2018   MCV 96.2 10/28/2018   MCH 32.6 10/28/2018   MCHC 33.8 10/28/2018   RDW 14.3 10/28/2018   LYMPHSABS 888 10/28/2018   MONOABS 0.7 09/21/2018   EOSABS 838 (H) 10/28/2018    BMET Lab Results  Component Value Date   NA 137 10/05/2018   K 4.2 10/05/2018   CL 103 09/21/2018   CO2 25 09/21/2018   GLUCOSE 130 (H) 09/21/2018   BUN 18 10/05/2018   CREATININE 0.8 10/05/2018   CALCIUM 7.8 (L) 09/21/2018   GFRNONAA >60 09/21/2018   GFRAA >60 09/21/2018      Assessment and Plan 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 Patient is doing well overall. Has completed 6 wk of IV therapy. Plan to remove picc line today.   Recurrent uti = recommend to follow up with urology to see if need for urodynamics to help minimize risk for recurrent uti  Loss of appetite = anticipate to improve since abtx has stopped. Recommend to supplement diet with various protein food supplementation to help regain weight.  Diarrhea = if worsens will test for cdifficile

## 2018-11-09 DIAGNOSIS — R2689 Other abnormalities of gait and mobility: Secondary | ICD-10-CM | POA: Diagnosis not present

## 2018-11-09 DIAGNOSIS — M62561 Muscle wasting and atrophy, not elsewhere classified, right lower leg: Secondary | ICD-10-CM | POA: Diagnosis not present

## 2018-11-09 DIAGNOSIS — M62562 Muscle wasting and atrophy, not elsewhere classified, left lower leg: Secondary | ICD-10-CM | POA: Diagnosis not present

## 2018-11-09 DIAGNOSIS — N3946 Mixed incontinence: Secondary | ICD-10-CM | POA: Diagnosis not present

## 2018-11-09 DIAGNOSIS — M6389 Disorders of muscle in diseases classified elsewhere, multiple sites: Secondary | ICD-10-CM | POA: Diagnosis not present

## 2018-11-09 DIAGNOSIS — M25511 Pain in right shoulder: Secondary | ICD-10-CM | POA: Diagnosis not present

## 2018-11-10 DIAGNOSIS — R2689 Other abnormalities of gait and mobility: Secondary | ICD-10-CM | POA: Diagnosis not present

## 2018-11-10 DIAGNOSIS — M62562 Muscle wasting and atrophy, not elsewhere classified, left lower leg: Secondary | ICD-10-CM | POA: Diagnosis not present

## 2018-11-10 DIAGNOSIS — M62561 Muscle wasting and atrophy, not elsewhere classified, right lower leg: Secondary | ICD-10-CM | POA: Diagnosis not present

## 2018-11-10 DIAGNOSIS — N3946 Mixed incontinence: Secondary | ICD-10-CM | POA: Diagnosis not present

## 2018-11-10 DIAGNOSIS — M25511 Pain in right shoulder: Secondary | ICD-10-CM | POA: Diagnosis not present

## 2018-11-10 DIAGNOSIS — M6389 Disorders of muscle in diseases classified elsewhere, multiple sites: Secondary | ICD-10-CM | POA: Diagnosis not present

## 2018-11-11 DIAGNOSIS — R2689 Other abnormalities of gait and mobility: Secondary | ICD-10-CM | POA: Diagnosis not present

## 2018-11-11 DIAGNOSIS — M62561 Muscle wasting and atrophy, not elsewhere classified, right lower leg: Secondary | ICD-10-CM | POA: Diagnosis not present

## 2018-11-11 DIAGNOSIS — M25511 Pain in right shoulder: Secondary | ICD-10-CM | POA: Diagnosis not present

## 2018-11-11 DIAGNOSIS — M62562 Muscle wasting and atrophy, not elsewhere classified, left lower leg: Secondary | ICD-10-CM | POA: Diagnosis not present

## 2018-11-11 DIAGNOSIS — M6389 Disorders of muscle in diseases classified elsewhere, multiple sites: Secondary | ICD-10-CM | POA: Diagnosis not present

## 2018-11-11 DIAGNOSIS — N3946 Mixed incontinence: Secondary | ICD-10-CM | POA: Diagnosis not present

## 2018-11-12 DIAGNOSIS — M25511 Pain in right shoulder: Secondary | ICD-10-CM | POA: Diagnosis not present

## 2018-11-12 DIAGNOSIS — M62561 Muscle wasting and atrophy, not elsewhere classified, right lower leg: Secondary | ICD-10-CM | POA: Diagnosis not present

## 2018-11-12 DIAGNOSIS — R2689 Other abnormalities of gait and mobility: Secondary | ICD-10-CM | POA: Diagnosis not present

## 2018-11-12 DIAGNOSIS — M6389 Disorders of muscle in diseases classified elsewhere, multiple sites: Secondary | ICD-10-CM | POA: Diagnosis not present

## 2018-11-12 DIAGNOSIS — M62562 Muscle wasting and atrophy, not elsewhere classified, left lower leg: Secondary | ICD-10-CM | POA: Diagnosis not present

## 2018-11-12 DIAGNOSIS — N3946 Mixed incontinence: Secondary | ICD-10-CM | POA: Diagnosis not present

## 2018-11-13 DIAGNOSIS — M25511 Pain in right shoulder: Secondary | ICD-10-CM | POA: Diagnosis not present

## 2018-11-13 DIAGNOSIS — M62561 Muscle wasting and atrophy, not elsewhere classified, right lower leg: Secondary | ICD-10-CM | POA: Diagnosis not present

## 2018-11-13 DIAGNOSIS — R2689 Other abnormalities of gait and mobility: Secondary | ICD-10-CM | POA: Diagnosis not present

## 2018-11-13 DIAGNOSIS — M62562 Muscle wasting and atrophy, not elsewhere classified, left lower leg: Secondary | ICD-10-CM | POA: Diagnosis not present

## 2018-11-13 DIAGNOSIS — M6389 Disorders of muscle in diseases classified elsewhere, multiple sites: Secondary | ICD-10-CM | POA: Diagnosis not present

## 2018-11-13 DIAGNOSIS — N3946 Mixed incontinence: Secondary | ICD-10-CM | POA: Diagnosis not present

## 2018-11-15 ENCOUNTER — Other Ambulatory Visit: Payer: Self-pay | Admitting: Cardiovascular Disease

## 2018-11-16 ENCOUNTER — Encounter: Payer: Self-pay | Admitting: Thoracic Surgery (Cardiothoracic Vascular Surgery)

## 2018-11-16 DIAGNOSIS — R7881 Bacteremia: Secondary | ICD-10-CM | POA: Diagnosis not present

## 2018-11-16 DIAGNOSIS — I350 Nonrheumatic aortic (valve) stenosis: Secondary | ICD-10-CM | POA: Diagnosis not present

## 2018-11-16 DIAGNOSIS — R601 Generalized edema: Secondary | ICD-10-CM | POA: Diagnosis not present

## 2018-11-16 DIAGNOSIS — M62561 Muscle wasting and atrophy, not elsewhere classified, right lower leg: Secondary | ICD-10-CM | POA: Diagnosis not present

## 2018-11-16 DIAGNOSIS — R2689 Other abnormalities of gait and mobility: Secondary | ICD-10-CM | POA: Diagnosis not present

## 2018-11-16 DIAGNOSIS — R278 Other lack of coordination: Secondary | ICD-10-CM | POA: Diagnosis not present

## 2018-11-16 DIAGNOSIS — T827XXS Infection and inflammatory reaction due to other cardiac and vascular devices, implants and grafts, sequela: Secondary | ICD-10-CM | POA: Diagnosis not present

## 2018-11-16 DIAGNOSIS — I5023 Acute on chronic systolic (congestive) heart failure: Secondary | ICD-10-CM | POA: Diagnosis not present

## 2018-11-16 DIAGNOSIS — N3946 Mixed incontinence: Secondary | ICD-10-CM | POA: Diagnosis not present

## 2018-11-16 DIAGNOSIS — I351 Nonrheumatic aortic (valve) insufficiency: Secondary | ICD-10-CM | POA: Diagnosis not present

## 2018-11-16 DIAGNOSIS — M62562 Muscle wasting and atrophy, not elsewhere classified, left lower leg: Secondary | ICD-10-CM | POA: Diagnosis not present

## 2018-11-16 DIAGNOSIS — M6389 Disorders of muscle in diseases classified elsewhere, multiple sites: Secondary | ICD-10-CM | POA: Diagnosis not present

## 2018-11-16 DIAGNOSIS — I330 Acute and subacute infective endocarditis: Secondary | ICD-10-CM | POA: Diagnosis not present

## 2018-11-16 DIAGNOSIS — A419 Sepsis, unspecified organism: Secondary | ICD-10-CM | POA: Diagnosis not present

## 2018-11-16 DIAGNOSIS — M25511 Pain in right shoulder: Secondary | ICD-10-CM | POA: Diagnosis not present

## 2018-11-16 DIAGNOSIS — F0390 Unspecified dementia without behavioral disturbance: Secondary | ICD-10-CM | POA: Diagnosis not present

## 2018-11-16 DIAGNOSIS — Z952 Presence of prosthetic heart valve: Secondary | ICD-10-CM | POA: Diagnosis not present

## 2018-11-16 NOTE — Telephone Encounter (Signed)
Rx(s) sent to pharmacy electronically.  

## 2018-11-17 DIAGNOSIS — N3946 Mixed incontinence: Secondary | ICD-10-CM | POA: Diagnosis not present

## 2018-11-17 DIAGNOSIS — M62561 Muscle wasting and atrophy, not elsewhere classified, right lower leg: Secondary | ICD-10-CM | POA: Diagnosis not present

## 2018-11-17 DIAGNOSIS — M6389 Disorders of muscle in diseases classified elsewhere, multiple sites: Secondary | ICD-10-CM | POA: Diagnosis not present

## 2018-11-17 DIAGNOSIS — M62562 Muscle wasting and atrophy, not elsewhere classified, left lower leg: Secondary | ICD-10-CM | POA: Diagnosis not present

## 2018-11-17 DIAGNOSIS — R2689 Other abnormalities of gait and mobility: Secondary | ICD-10-CM | POA: Diagnosis not present

## 2018-11-17 DIAGNOSIS — M25511 Pain in right shoulder: Secondary | ICD-10-CM | POA: Diagnosis not present

## 2018-11-18 DIAGNOSIS — M62561 Muscle wasting and atrophy, not elsewhere classified, right lower leg: Secondary | ICD-10-CM | POA: Diagnosis not present

## 2018-11-18 DIAGNOSIS — M6389 Disorders of muscle in diseases classified elsewhere, multiple sites: Secondary | ICD-10-CM | POA: Diagnosis not present

## 2018-11-18 DIAGNOSIS — M25511 Pain in right shoulder: Secondary | ICD-10-CM | POA: Diagnosis not present

## 2018-11-18 DIAGNOSIS — M62562 Muscle wasting and atrophy, not elsewhere classified, left lower leg: Secondary | ICD-10-CM | POA: Diagnosis not present

## 2018-11-18 DIAGNOSIS — R2689 Other abnormalities of gait and mobility: Secondary | ICD-10-CM | POA: Diagnosis not present

## 2018-11-18 DIAGNOSIS — N3946 Mixed incontinence: Secondary | ICD-10-CM | POA: Diagnosis not present

## 2018-11-19 ENCOUNTER — Encounter: Payer: Self-pay | Admitting: Adult Health

## 2018-11-19 ENCOUNTER — Non-Acute Institutional Stay (SKILLED_NURSING_FACILITY): Payer: Medicare Other | Admitting: Adult Health

## 2018-11-19 DIAGNOSIS — R1114 Bilious vomiting: Secondary | ICD-10-CM | POA: Diagnosis not present

## 2018-11-19 NOTE — Progress Notes (Signed)
Location:  Occupational psychologist of Service:  SNF (31) Provider:   Cindi Carbon, [place] [phone]   Tisovec, Fransico Him, MD  Patient Care Team: Tisovec, Fransico Him, MD as PCP - General (Internal Medicine) Sanda Klein, MD as PCP - Cardiology (Cardiology) Carolan Clines, MD (Inactive) as Consulting Physician (Urology)  Extended Emergency Contact Information Primary Emergency Contact: [facility] Address: [address]          [place], [place] [address] [name] of [place] Phone: [phone] Mobile Phone: [phone] Relation: Spouse Secondary Emergency Contact: Michail Sermon, [place] of [place] Phone: [phone] Mobile Phone: [phone] Relation: Daughter  Code Status: Full code Goals of care: Advanced Directive information Advanced Directives 09/05/2018  Does Patient Have a Medical Advance Directive? No  Type of Advance Directive -  Does patient want to make changes to medical advance directive? -  Copy of [place] in Chart? -  Would patient like information on creating a medical advance directive? No - Patient declined  Pre-existing out of facility DNR order (yellow form or pink MOST form) -     Chief Complaint  Patient presents with  . Acute Visit    vomiting    HPI:  Pt is a 85 y.o. male seen today for an acute visit for vomiting. The staff report that he had one episode of yellow/green vomit at 630 this morning and again after lunch. He has not had an appetite. He tried ginger ale but he could not keep it down. There are no reports of sick contacts. No fever, abd pain, etc. No dysuria, sob, cough. He is having bowel movements regularly that are soft but no reported diarrhea. Miralax has been held. The nurse gave him 25 mg supp of phenergan per protocol and he is now sleeping. He received a prostat supplement this morning and his wife request  that it be held. 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    Past Medical History:  Diagnosis Date  . AAA (abdominal aortic aneurysm) ([place])    7/13 3.8cm  . Arthritis    "joints" (01/11/2014)  . Asthma    "seasonal; some foods"   . Chronic systolic CHF (congestive heart failure) ([place])   . Dementia ([place])   . HLD (hyperlipidemia)   . HOH (hard of hearing)   . Lumbar vertebral fracture ([facility])    L1- 04/11/2014   . Prostate cancer ([place])   . S/P ICD (internal cardiac defibrillator) procedure, 01/11/14 removal of ERI gen and placement of Medtronic Evera XT VR & NEW Right ventricular lead Medtronic 01/12/2014  . S/P TAVR (transcatheter aortic valve replacement)    Edwards Sapien 3 THV (size 29 mm, model # [id number], serial # [id number])  . Severe aortic stenosis   . Sleep apnea    "lost 60# & don't have it anymore" (01/11/2014)  . Ventricular tachycardia [facility])    Past Surgical History:  Procedure Laterality Date  . CARDIAC CATHETERIZATION  08/20/2004   noncritical CAD,mild global hypokinesis, EF 50%  . CARDIAC DEFIBRILLATOR PLACEMENT  08/23/2004   Medtronic  . CATARACT EXTRACTION W/ INTRAOCULAR LENS IMPLANT Right   . COLECTOMY  1990's  . CRYOABLATION N/A 02/24/2014   Procedure: CRYO ABLATION PROSTATE;  Surgeon: Ailene Rud, MD;  Location: [facility] ORS;  Service: Urology;  Laterality: N/A;  .  HERNIA REPAIR     "abdomen; from colon OR"  . ICD LEAD REMOVAL N/A 09/15/2018   Procedure: ICD LEAD REMOVAL EXTRACTION;  Surgeon: Evans Lance, MD;  [facility] OR;  Service: Cardiovascular;  Laterality: N/A;  DR. BARTLE TO BACK UP  . IMPLANTABLE CARDIOVERTER DEFIBRILLATOR (ICD) GENERATOR CHANGE N/A 01/11/2014   Procedure: ICD GENERATOR CHANGE;  Surgeon: Sanda Klein, MD;  Location: [facility] CATH LAB;  Service: Cardiovascular;  Laterality: N/A;  . LEAD REVISION N/A 01/11/2014   Procedure: LEAD REVISION;  Surgeon: Sanda Klein, MD;  Location: [facility] CATH LAB;  Service: Cardiovascular;  Laterality: N/A;  . NM MYOCAR PERF WALL MOTION  01/29/2012   abnormal c/o infarct/scar,no ischemia present  . PROSTATE BIOPSY N/A 11/22/2013   Procedure: PROSTATE BIOPSY AND ULTRASOUND;  Surgeon: Ailene Rud, MD;  Location: [facility] ORS;  Service: Urology;  Laterality: N/A;  . RIGHT/LEFT HEART CATH AND CORONARY ANGIOGRAPHY N/A 07/06/2018   Procedure: RIGHT/LEFT HEART CATH AND CORONARY ANGIOGRAPHY;  Surgeon: Troy Sine, MD;  Location: [facility] CV LAB;  Service: Cardiovascular;  Laterality: N/A;  . TEE WITHOUT CARDIOVERSION N/A 08/18/2018   Procedure: TRANSESOPHAGEAL ECHOCARDIOGRAM (TEE);  Surgeon: Burnell Blanks, MD;  Location: [facility];  Service: Open Heart Surgery;  Laterality: N/A;  . TEE WITHOUT CARDIOVERSION N/A 09/08/2018   Procedure: TRANSESOPHAGEAL ECHOCARDIOGRAM (TEE);  Surgeon: Lelon Perla, MD;  Location: [facility] ENDOSCOPY;  Service: Cardiovascular;  Laterality: N/A;  . TEE WITHOUT CARDIOVERSION N/A 09/15/2018   Procedure: TRANSESOPHAGEAL ECHOCARDIOGRAM (TEE);  Surgeon: Evans Lance, MD;  Location: [facility];  Service: Cardiovascular;  Laterality: N/A;  . TRANSCATHETER AORTIC VALVE REPLACEMENT, TRANSFEMORAL  08/18/2018  . TRANSCATHETER AORTIC VALVE REPLACEMENT, TRANSFEMORAL N/A 08/18/2018   Procedure: TRANSCATHETER AORTIC VALVE REPLACEMENT, TRANSFEMORAL using an Edwards 27mm Aortic Valve;  Surgeon: Burnell Blanks, MD;  Location: [facility];  Service: Open Heart Surgery;  Laterality: N/A;    Allergies  Allergen Reactions  . Crestor [Rosuvastatin] Other (See Comments)    Hurts muscles  . Lipitor [Atorvastatin] Other (See Comments)    Hurts stomach  . Shrimp [Shellfish Allergy] Other (See Comments)    On MAR    Outpatient Encounter Medications as of 11/19/2018  Medication Sig  . acetaminophen (TYLENOL) 500 MG tablet Take 500 mg by mouth every 6 (six) hours as needed (for pain).   Marland Kitchen amiodarone (PACERONE)  200 MG tablet Take 0.5 tablets (100 mg total) by mouth daily.  Marland Kitchen aspirin 81 MG chewable tablet Chew 1 tablet (81 mg total) by mouth daily.  . cholecalciferol (VITAMIN D) 1000 units tablet Take 2,000 Units by mouth at bedtime.  . clopidogrel (PLAVIX) 75 MG tablet Take 1 tablet (75 mg total) by mouth daily with breakfast.  . ezetimibe-simvastatin (VYTORIN) 10-20 MG per tablet Take 1 tablet by mouth at bedtime.  . metoprolol succinate (TOPROL-XL) 25 MG 24 hr tablet Take 1 tablet (25 mg total) by mouth daily. Take with or immediately following a meal.  . Multiple Vitamins-Minerals (PRESERVISION AREDS 2 PO) Take 1 capsule by mouth daily.   . polyethylene glycol (MIRALAX / GLYCOLAX) packet Take 17 g by mouth every evening. (Patient taking differently: Take 1 g by mouth every evening. 1 teaspoon daily)  . triamcinolone (KENALOG) 0.025 % cream Apply 1 application topically 2 (two) times daily. Until resolved   No facility-administered encounter medications on file as of 11/19/2018.     Review of Systems  Unable to perform ROS: Dementia    Immunization History  Administered Date(s) Administered  . Influenza, High Dose Seasonal PF 08/13/2017, 07/03/2018  . Influenza,inj,Quad PF,6+ Mos 08/18/2013, 07/23/2016  . Pneumococcal Conjugate-13 08/14/2013  . Pneumococcal Polysaccharide-23 04/06/2009   Pertinent  Health Maintenance Due  Topic Date Due  . INFLUENZA VACCINE  Completed  . PNA vac Low Risk Adult  Completed   No flowsheet data found. Functional Status Survey:    Vitals:   11/19/18 1632  BP: (!) 148/80  Pulse: 61  Resp: 20  Temp: 98.9 F (37.2 C)  SpO2: 98%   There is no height or weight on file to calculate BMI. Physical Exam Vitals signs and nursing note reviewed.  Constitutional:      General: He is not in acute distress.    Appearance: He is obese. He is not ill-appearing.     Comments: sleeping  HENT:     Head: Normocephalic and atraumatic.     Nose: Nose normal. No  congestion or rhinorrhea.     Mouth/Throat:     Mouth: Mucous membranes are moist.     Pharynx: Oropharynx is clear. No oropharyngeal exudate or posterior oropharyngeal erythema.  Eyes:     General:        Right eye: No discharge.        Left eye: No discharge.     Conjunctiva/sclera: Conjunctivae normal.     Pupils: Pupils are equal, round, and reactive to light.  Cardiovascular:     Rate and Rhythm: Normal rate and regular rhythm.     Heart sounds: No murmur.  Abdominal:     General: Abdomen is flat. There is no distension.     Palpations: Abdomen is soft. There is no mass.     Tenderness: There is no abdominal tenderness. There is no guarding.     Hernia: No hernia is present.     Comments: Hypoactive bowel sounds x 4  Musculoskeletal:        General: No swelling, tenderness, deformity or signs of injury.     Right lower leg: Edema present.     Left lower leg: Edema present.  Lymphadenopathy:     Cervical: No cervical adenopathy.  Skin:    General: Skin is warm and dry.  Neurological:     General: No focal deficit present.     Mental Status: Mental status is at baseline.     Comments: Oriented to self and place, able to f/c and MAE  Psychiatric:        Mood and Affect: Mood normal.     Labs reviewed: Recent Labs    08/07/18 0512 08/07/18 1411  09/08/18 0719 09/09/18 0826  09/15/18 0343  09/17/18 0332 09/18/18 0850 09/21/18 0413 10/05/18 0500  NA 134* 138   < > 135 132*   < > 133*   < > 132* 135 137 137  K 3.6 3.2*   < > 3.5 3.7   < > 3.7   < > 3.6 3.5 3.6 4.2  CL 100 107   < > 105 103   < > 101   < > 99 102 103  --   CO2 26 25   < > 23 21*   < > 24   < > 24 24 25   --   GLUCOSE 164* 102*   < > 129* 126*   < > 155*   < > 121* 171* 130*  --   BUN 16 12   < > 12 13   < > 17   < >  16 14 19 18   CREATININE 1.30* 1.16   < > 0.95 0.92   < > 1.01   < > 0.77 0.88 0.81 0.8  CALCIUM 8.8* 8.2*   < > 8.1* 8.2*   < > 8.1*   < > 7.7* 7.8* 7.8*  --   MG 2.2 2.2   < > 2.3 2.2   --  2.4  --   --   --   --   --   PHOS 3.6 3.3  --   --   --   --   --   --   --   --   --   --    < > = values in this interval not displayed.   Recent Labs    09/07/18 0303 09/08/18 0719 09/09/18 0826  AST 34 28 26  ALT 66* 47* 42  ALKPHOS 203* 175* 155*  BILITOT 1.2 1.2 1.4*  PROT 5.7* 5.7* 5.6*  ALBUMIN 2.5* 2.4* 2.5*   Recent Labs    09/18/18 0850 09/21/18 0413 10/05/18 0500 10/28/18 1629  WBC 6.3 5.5 8.2 6.3  NEUTROABS 4.6 2.9  --  4,082  HGB 9.9* 9.7* 11.7* 11.1*  HCT 29.0* 28.5* 33* 32.8*  MCV 96.3 97.3  --  96.2  PLT 129* 144* 158 131*   Lab Results  Component Value Date   TSH 1.75 02/05/2017   Lab Results  Component Value Date   HGBA1C 5.2 08/13/2018   No results found for: CHOL, HDL, LDLCALC, LDLDIRECT, TRIG, CHOLHDL  Significant Diagnostic Results in last 30 days:  No results found.  Assessment/Plan 1. Bilious vomiting with nausea Two episodes with no diarrhea or fever Try Zofran 4 mg q 8 prn PO/IM and avoid phenergan unless symptoms are refractory. If he continues past 24 hrs will check labs and abd xray. Abd not distended or tender. If loose stools present check cdiff Clear diet and advanced as tolerated.     Family/ staff Communication: discussed with his wife and his nurse  Labs/tests ordered:  As above

## 2018-11-20 ENCOUNTER — Ambulatory Visit: Payer: Medicare Other | Admitting: Cardiovascular Disease

## 2018-11-21 ENCOUNTER — Inpatient Hospital Stay (HOSPITAL_COMMUNITY): Payer: Medicare Other

## 2018-11-21 ENCOUNTER — Other Ambulatory Visit: Payer: Self-pay

## 2018-11-21 ENCOUNTER — Inpatient Hospital Stay (HOSPITAL_COMMUNITY)
Admission: EM | Admit: 2018-11-21 | Discharge: 2018-11-28 | DRG: 871 | Disposition: A | Discharge status: Skilled Nursing Facility | Payer: Medicare Other | Source: Skilled Nursing Facility | Attending: Internal Medicine | Admitting: Internal Medicine

## 2018-11-21 ENCOUNTER — Emergency Department (HOSPITAL_COMMUNITY): Payer: Medicare Other

## 2018-11-21 ENCOUNTER — Encounter (HOSPITAL_COMMUNITY): Payer: Self-pay | Admitting: Oncology

## 2018-11-21 DIAGNOSIS — Z9841 Cataract extraction status, right eye: Secondary | ICD-10-CM

## 2018-11-21 DIAGNOSIS — B9689 Other specified bacterial agents as the cause of diseases classified elsewhere: Secondary | ICD-10-CM

## 2018-11-21 DIAGNOSIS — Z6829 Body mass index (BMI) 29.0-29.9, adult: Secondary | ICD-10-CM

## 2018-11-21 DIAGNOSIS — Z8546 Personal history of malignant neoplasm of prostate: Secondary | ICD-10-CM | POA: Diagnosis not present

## 2018-11-21 DIAGNOSIS — G9341 Metabolic encephalopathy: Secondary | ICD-10-CM | POA: Diagnosis not present

## 2018-11-21 DIAGNOSIS — Z91013 Allergy to seafood: Secondary | ICD-10-CM | POA: Diagnosis not present

## 2018-11-21 DIAGNOSIS — I714 Abdominal aortic aneurysm, without rupture: Secondary | ICD-10-CM | POA: Diagnosis present

## 2018-11-21 DIAGNOSIS — I447 Left bundle-branch block, unspecified: Secondary | ICD-10-CM | POA: Diagnosis not present

## 2018-11-21 DIAGNOSIS — R6521 Severe sepsis with septic shock: Secondary | ICD-10-CM | POA: Diagnosis present

## 2018-11-21 DIAGNOSIS — E876 Hypokalemia: Secondary | ICD-10-CM | POA: Diagnosis present

## 2018-11-21 DIAGNOSIS — R918 Other nonspecific abnormal finding of lung field: Secondary | ICD-10-CM | POA: Diagnosis present

## 2018-11-21 DIAGNOSIS — K5730 Diverticulosis of large intestine without perforation or abscess without bleeding: Secondary | ICD-10-CM | POA: Diagnosis not present

## 2018-11-21 DIAGNOSIS — I110 Hypertensive heart disease with heart failure: Secondary | ICD-10-CM | POA: Diagnosis present

## 2018-11-21 DIAGNOSIS — K811 Chronic cholecystitis: Secondary | ICD-10-CM | POA: Diagnosis not present

## 2018-11-21 DIAGNOSIS — M1990 Unspecified osteoarthritis, unspecified site: Secondary | ICD-10-CM | POA: Diagnosis present

## 2018-11-21 DIAGNOSIS — R7881 Bacteremia: Secondary | ICD-10-CM | POA: Diagnosis not present

## 2018-11-21 DIAGNOSIS — R579 Shock, unspecified: Secondary | ICD-10-CM | POA: Diagnosis present

## 2018-11-21 DIAGNOSIS — L8990 Pressure ulcer of unspecified site, unspecified stage: Secondary | ICD-10-CM | POA: Diagnosis not present

## 2018-11-21 DIAGNOSIS — Z87440 Personal history of urinary (tract) infections: Secondary | ICD-10-CM

## 2018-11-21 DIAGNOSIS — J9602 Acute respiratory failure with hypercapnia: Secondary | ICD-10-CM | POA: Diagnosis not present

## 2018-11-21 DIAGNOSIS — N2889 Other specified disorders of kidney and ureter: Secondary | ICD-10-CM | POA: Diagnosis not present

## 2018-11-21 DIAGNOSIS — H9190 Unspecified hearing loss, unspecified ear: Secondary | ICD-10-CM | POA: Diagnosis present

## 2018-11-21 DIAGNOSIS — T826XXD Infection and inflammatory reaction due to cardiac valve prosthesis, subsequent encounter: Secondary | ICD-10-CM | POA: Diagnosis not present

## 2018-11-21 DIAGNOSIS — F0390 Unspecified dementia without behavioral disturbance: Secondary | ICD-10-CM | POA: Diagnosis present

## 2018-11-21 DIAGNOSIS — R739 Hyperglycemia, unspecified: Secondary | ICD-10-CM | POA: Diagnosis present

## 2018-11-21 DIAGNOSIS — Z8249 Family history of ischemic heart disease and other diseases of the circulatory system: Secondary | ICD-10-CM

## 2018-11-21 DIAGNOSIS — Z888 Allergy status to other drugs, medicaments and biological substances status: Secondary | ICD-10-CM

## 2018-11-21 DIAGNOSIS — K810 Acute cholecystitis: Secondary | ICD-10-CM

## 2018-11-21 DIAGNOSIS — R0902 Hypoxemia: Secondary | ICD-10-CM | POA: Diagnosis not present

## 2018-11-21 DIAGNOSIS — I428 Other cardiomyopathies: Secondary | ICD-10-CM | POA: Diagnosis present

## 2018-11-21 DIAGNOSIS — J9600 Acute respiratory failure, unspecified whether with hypoxia or hypercapnia: Secondary | ICD-10-CM

## 2018-11-21 DIAGNOSIS — A4159 Other Gram-negative sepsis: Principal | ICD-10-CM | POA: Diagnosis present

## 2018-11-21 DIAGNOSIS — I340 Nonrheumatic mitral (valve) insufficiency: Secondary | ICD-10-CM | POA: Diagnosis not present

## 2018-11-21 DIAGNOSIS — I255 Ischemic cardiomyopathy: Secondary | ICD-10-CM | POA: Diagnosis present

## 2018-11-21 DIAGNOSIS — I491 Atrial premature depolarization: Secondary | ICD-10-CM | POA: Diagnosis not present

## 2018-11-21 DIAGNOSIS — K8000 Calculus of gallbladder with acute cholecystitis without obstruction: Secondary | ICD-10-CM | POA: Diagnosis present

## 2018-11-21 DIAGNOSIS — Z4682 Encounter for fitting and adjustment of non-vascular catheter: Secondary | ICD-10-CM | POA: Diagnosis not present

## 2018-11-21 DIAGNOSIS — E46 Unspecified protein-calorie malnutrition: Secondary | ICD-10-CM | POA: Diagnosis present

## 2018-11-21 DIAGNOSIS — K8309 Other cholangitis: Secondary | ICD-10-CM | POA: Diagnosis present

## 2018-11-21 DIAGNOSIS — I2510 Atherosclerotic heart disease of native coronary artery without angina pectoris: Secondary | ICD-10-CM | POA: Diagnosis present

## 2018-11-21 DIAGNOSIS — I38 Endocarditis, valve unspecified: Secondary | ICD-10-CM | POA: Diagnosis not present

## 2018-11-21 DIAGNOSIS — Z953 Presence of xenogenic heart valve: Secondary | ICD-10-CM | POA: Diagnosis not present

## 2018-11-21 DIAGNOSIS — G4733 Obstructive sleep apnea (adult) (pediatric): Secondary | ICD-10-CM | POA: Diagnosis present

## 2018-11-21 DIAGNOSIS — R000 Tachycardia, unspecified: Secondary | ICD-10-CM | POA: Diagnosis not present

## 2018-11-21 DIAGNOSIS — R531 Weakness: Secondary | ICD-10-CM | POA: Diagnosis not present

## 2018-11-21 DIAGNOSIS — Z961 Presence of intraocular lens: Secondary | ICD-10-CM | POA: Diagnosis present

## 2018-11-21 DIAGNOSIS — E1165 Type 2 diabetes mellitus with hyperglycemia: Secondary | ICD-10-CM | POA: Diagnosis not present

## 2018-11-21 DIAGNOSIS — Z79899 Other long term (current) drug therapy: Secondary | ICD-10-CM

## 2018-11-21 DIAGNOSIS — J9811 Atelectasis: Secondary | ICD-10-CM | POA: Diagnosis present

## 2018-11-21 DIAGNOSIS — R4182 Altered mental status, unspecified: Secondary | ICD-10-CM | POA: Diagnosis not present

## 2018-11-21 DIAGNOSIS — Z7401 Bed confinement status: Secondary | ICD-10-CM | POA: Diagnosis not present

## 2018-11-21 DIAGNOSIS — I5022 Chronic systolic (congestive) heart failure: Secondary | ICD-10-CM | POA: Diagnosis present

## 2018-11-21 DIAGNOSIS — Z8619 Personal history of other infectious and parasitic diseases: Secondary | ICD-10-CM | POA: Diagnosis not present

## 2018-11-21 DIAGNOSIS — A419 Sepsis, unspecified organism: Secondary | ICD-10-CM | POA: Diagnosis not present

## 2018-11-21 DIAGNOSIS — E785 Hyperlipidemia, unspecified: Secondary | ICD-10-CM | POA: Diagnosis present

## 2018-11-21 DIAGNOSIS — B952 Enterococcus as the cause of diseases classified elsewhere: Secondary | ICD-10-CM | POA: Diagnosis present

## 2018-11-21 DIAGNOSIS — K819 Cholecystitis, unspecified: Secondary | ICD-10-CM

## 2018-11-21 DIAGNOSIS — R6520 Severe sepsis without septic shock: Secondary | ICD-10-CM

## 2018-11-21 DIAGNOSIS — Z978 Presence of other specified devices: Secondary | ICD-10-CM | POA: Diagnosis not present

## 2018-11-21 DIAGNOSIS — Z9911 Dependence on respirator [ventilator] status: Secondary | ICD-10-CM

## 2018-11-21 DIAGNOSIS — J9601 Acute respiratory failure with hypoxia: Secondary | ICD-10-CM | POA: Diagnosis present

## 2018-11-21 DIAGNOSIS — R0609 Other forms of dyspnea: Secondary | ICD-10-CM | POA: Diagnosis not present

## 2018-11-21 DIAGNOSIS — G4730 Sleep apnea, unspecified: Secondary | ICD-10-CM | POA: Diagnosis not present

## 2018-11-21 DIAGNOSIS — Z7982 Long term (current) use of aspirin: Secondary | ICD-10-CM

## 2018-11-21 DIAGNOSIS — R1084 Generalized abdominal pain: Secondary | ICD-10-CM | POA: Diagnosis not present

## 2018-11-21 DIAGNOSIS — M2550 Pain in unspecified joint: Secondary | ICD-10-CM | POA: Diagnosis not present

## 2018-11-21 DIAGNOSIS — Z7902 Long term (current) use of antithrombotics/antiplatelets: Secondary | ICD-10-CM

## 2018-11-21 DIAGNOSIS — Z952 Presence of prosthetic heart valve: Secondary | ICD-10-CM | POA: Diagnosis not present

## 2018-11-21 DIAGNOSIS — N390 Urinary tract infection, site not specified: Secondary | ICD-10-CM

## 2018-11-21 DIAGNOSIS — Z9049 Acquired absence of other specified parts of digestive tract: Secondary | ICD-10-CM

## 2018-11-21 DIAGNOSIS — I4891 Unspecified atrial fibrillation: Secondary | ICD-10-CM | POA: Diagnosis not present

## 2018-11-21 DIAGNOSIS — R011 Cardiac murmur, unspecified: Secondary | ICD-10-CM | POA: Diagnosis not present

## 2018-11-21 LAB — CBC WITH DIFFERENTIAL/PLATELET
Abs Immature Granulocytes: 0.08 10*3/uL — ABNORMAL HIGH (ref 0.00–0.07)
Basophils Absolute: 0 10*3/uL (ref 0.0–0.1)
Basophils Relative: 0 %
EOS PCT: 0 %
Eosinophils Absolute: 0 10*3/uL (ref 0.0–0.5)
HCT: 36.3 % — ABNORMAL LOW (ref 39.0–52.0)
Hemoglobin: 12.3 g/dL — ABNORMAL LOW (ref 13.0–17.0)
Immature Granulocytes: 1 %
Lymphocytes Relative: 3 %
Lymphs Abs: 0.5 10*3/uL — ABNORMAL LOW (ref 0.7–4.0)
MCH: 31.6 pg (ref 26.0–34.0)
MCHC: 33.9 g/dL (ref 30.0–36.0)
MCV: 93.3 fL (ref 80.0–100.0)
Monocytes Absolute: 0.9 10*3/uL (ref 0.1–1.0)
Monocytes Relative: 7 %
Neutro Abs: 12.6 10*3/uL — ABNORMAL HIGH (ref 1.7–7.7)
Neutrophils Relative %: 89 %
Platelets: 163 10*3/uL (ref 150–400)
RBC: 3.89 MIL/uL — ABNORMAL LOW (ref 4.22–5.81)
RDW: 14.6 % (ref 11.5–15.5)
WBC: 14.1 10*3/uL — ABNORMAL HIGH (ref 4.0–10.5)
nRBC: 0 % (ref 0.0–0.2)

## 2018-11-21 LAB — BLOOD CULTURE ID PANEL (REFLEXED)
Acinetobacter baumannii: NOT DETECTED
CANDIDA PARAPSILOSIS: NOT DETECTED
Candida albicans: NOT DETECTED
Candida glabrata: NOT DETECTED
Candida krusei: NOT DETECTED
Candida tropicalis: NOT DETECTED
Carbapenem resistance: NOT DETECTED
Enterobacter cloacae complex: DETECTED — AB
Enterobacteriaceae species: DETECTED — AB
Enterococcus species: NOT DETECTED
Escherichia coli: NOT DETECTED
Haemophilus influenzae: NOT DETECTED
KLEBSIELLA PNEUMONIAE: NOT DETECTED
Klebsiella oxytoca: NOT DETECTED
Listeria monocytogenes: NOT DETECTED
Neisseria meningitidis: NOT DETECTED
PROTEUS SPECIES: NOT DETECTED
Pseudomonas aeruginosa: NOT DETECTED
Serratia marcescens: NOT DETECTED
Staphylococcus aureus (BCID): NOT DETECTED
Staphylococcus species: NOT DETECTED
Streptococcus agalactiae: NOT DETECTED
Streptococcus pneumoniae: NOT DETECTED
Streptococcus pyogenes: NOT DETECTED
Streptococcus species: NOT DETECTED

## 2018-11-21 LAB — COMPREHENSIVE METABOLIC PANEL
ALT: 21 U/L (ref 0–44)
ALT: 21 U/L (ref 0–44)
AST: 23 U/L (ref 15–41)
AST: 25 U/L (ref 15–41)
Albumin: 2.2 g/dL — ABNORMAL LOW (ref 3.5–5.0)
Albumin: 2 g/dL — ABNORMAL LOW (ref 3.5–5.0)
Alkaline Phosphatase: 91 U/L (ref 38–126)
Alkaline Phosphatase: 87 U/L (ref 38–126)
Anion gap: 9 (ref 5–15)
Anion gap: 13 (ref 5–15)
BILIRUBIN TOTAL: 1.3 mg/dL — AB (ref 0.3–1.2)
BUN: 20 mg/dL (ref 8–23)
BUN: 20 mg/dL (ref 8–23)
CO2: 23 mmol/L (ref 22–32)
CO2: 18 mmol/L — ABNORMAL LOW (ref 22–32)
Calcium: 8 mg/dL — ABNORMAL LOW (ref 8.9–10.3)
Calcium: 7.8 mg/dL — ABNORMAL LOW (ref 8.9–10.3)
Chloride: 103 mmol/L (ref 98–111)
Chloride: 107 mmol/L (ref 98–111)
Creatinine, Ser: 1.04 mg/dL (ref 0.61–1.24)
Creatinine, Ser: 1.09 mg/dL (ref 0.61–1.24)
GFR calc Af Amer: >60 mL/min (ref >60)
GFR calc Af Amer: >60 mL/min (ref >60)
GFR calc non Af Amer: >60 mL/min (ref >60)
GFR calc non Af Amer: 60 mL/min — ABNORMAL LOW (ref >60)
Glucose, Bld: 241 mg/dL — ABNORMAL HIGH (ref 70–99)
Glucose, Bld: 188 mg/dL — ABNORMAL HIGH (ref 70–99)
Potassium: 3.3 mmol/L — ABNORMAL LOW (ref 3.5–5.1)
Potassium: 3.1 mmol/L — ABNORMAL LOW (ref 3.5–5.1)
Sodium: 135 mmol/L (ref 135–145)
Sodium: 138 mmol/L (ref 135–145)
Total Bilirubin: 1.4 mg/dL — ABNORMAL HIGH (ref 0.3–1.2)
Total Protein: 6.5 g/dL (ref 6.5–8.1)
Total Protein: 5.8 g/dL — ABNORMAL LOW (ref 6.5–8.1)

## 2018-11-21 LAB — POCT I-STAT 7, (LYTES, BLD GAS, ICA,H+H)
Acid-base deficit: 3 mmol/L — ABNORMAL HIGH (ref 0.0–2.0)
Acid-base deficit: 2 mmol/L (ref 0.0–2.0)
Bicarbonate: 19.7 mmol/L — ABNORMAL LOW (ref 20.0–28.0)
Bicarbonate: 20.7 mmol/L (ref 20.0–28.0)
Calcium, Ion: 1.08 mmol/L — ABNORMAL LOW (ref 1.15–1.40)
Calcium, Ion: 1.14 mmol/L — ABNORMAL LOW (ref 1.15–1.40)
HCT: 38 % — ABNORMAL LOW (ref 39.0–52.0)
HCT: 30 % — ABNORMAL LOW (ref 39.0–52.0)
HEMOGLOBIN: 10.2 g/dL — AB (ref 13.0–17.0)
Hemoglobin: 12.9 g/dL — ABNORMAL LOW (ref 13.0–17.0)
O2 Saturation: 100 %
O2 Saturation: 100 %
Patient temperature: 98.6
Potassium: 3.3 mmol/L — ABNORMAL LOW (ref 3.5–5.1)
Potassium: 3.4 mmol/L — ABNORMAL LOW (ref 3.5–5.1)
Sodium: 136 mmol/L (ref 135–145)
Sodium: 138 mmol/L (ref 135–145)
TCO2: 21 mmol/L — ABNORMAL LOW (ref 22–32)
TCO2: 22 mmol/L (ref 22–32)
pCO2 arterial: 30.7 mmHg — ABNORMAL LOW (ref 32.0–48.0)
pCO2 arterial: 28.3 mmHg — ABNORMAL LOW (ref 32.0–48.0)
pH, Arterial: 7.42 (ref 7.350–7.450)
pH, Arterial: 7.473 — ABNORMAL HIGH (ref 7.350–7.450)
pO2, Arterial: 446 mmHg — ABNORMAL HIGH (ref 83.0–108.0)
pO2, Arterial: 187 mmHg — ABNORMAL HIGH (ref 83.0–108.0)

## 2018-11-21 LAB — CBC
HCT: 33.5 % — ABNORMAL LOW (ref 39.0–52.0)
Hemoglobin: 11.4 g/dL — ABNORMAL LOW (ref 13.0–17.0)
MCH: 31.7 pg (ref 26.0–34.0)
MCHC: 34 g/dL (ref 30.0–36.0)
MCV: 93.1 fL (ref 80.0–100.0)
Platelets: 127 10*3/uL — ABNORMAL LOW (ref 150–400)
RBC: 3.6 MIL/uL — ABNORMAL LOW (ref 4.22–5.81)
RDW: 14.6 % (ref 11.5–15.5)
WBC: 13.7 10*3/uL — ABNORMAL HIGH (ref 4.0–10.5)
nRBC: 0 % (ref 0.0–0.2)

## 2018-11-21 LAB — GLUCOSE, CAPILLARY
GLUCOSE-CAPILLARY: 126 mg/dL — AB (ref 70–99)
Glucose-Capillary: 175 mg/dL — ABNORMAL HIGH (ref 70–99)
Glucose-Capillary: 189 mg/dL — ABNORMAL HIGH (ref 70–99)
Glucose-Capillary: 133 mg/dL — ABNORMAL HIGH (ref 70–99)

## 2018-11-21 LAB — AMYLASE: Amylase: 10 U/L — ABNORMAL LOW (ref 28–100)

## 2018-11-21 LAB — URINALYSIS, ROUTINE W REFLEX MICROSCOPIC
BILIRUBIN URINE: NEGATIVE
Glucose, UA: NEGATIVE mg/dL
Ketones, ur: NEGATIVE mg/dL
Nitrite: NEGATIVE
PROTEIN: 30 mg/dL — AB
RBC / HPF: >50 RBC/hpf — ABNORMAL HIGH (ref 0–5)
Specific Gravity, Urine: >1.03 — ABNORMAL HIGH (ref 1.005–1.030)
pH: 6 (ref 5.0–8.0)

## 2018-11-21 LAB — CORTISOL: Cortisol, Plasma: 30.8 ug/dL

## 2018-11-21 LAB — BRAIN NATRIURETIC PEPTIDE: B Natriuretic Peptide: 332.5 pg/mL — ABNORMAL HIGH (ref 0.0–100.0)

## 2018-11-21 LAB — PROTIME-INR
INR: 1.2
INR: 1.33
Prothrombin Time: 15.1 seconds (ref 11.4–15.2)
Prothrombin Time: 16.4 seconds — ABNORMAL HIGH (ref 11.4–15.2)

## 2018-11-21 LAB — MAGNESIUM
MAGNESIUM: 1.8 mg/dL (ref 1.7–2.4)
Magnesium: 1.8 mg/dL (ref 1.7–2.4)

## 2018-11-21 LAB — STREP PNEUMONIAE URINARY ANTIGEN: Strep Pneumo Urinary Antigen: NEGATIVE

## 2018-11-21 LAB — PHOSPHORUS
Phosphorus: 1.9 mg/dL — ABNORMAL LOW (ref 2.5–4.6)
Phosphorus: 2.3 mg/dL — ABNORMAL LOW (ref 2.5–4.6)

## 2018-11-21 LAB — INFLUENZA PANEL BY PCR (TYPE A & B)
Influenza A By PCR: NEGATIVE
Influenza B By PCR: NEGATIVE

## 2018-11-21 LAB — LIPASE, BLOOD: Lipase: 20 U/L (ref 11–51)

## 2018-11-21 LAB — LACTIC ACID, PLASMA
Lactic Acid, Venous: 2.3 mmol/L (ref 0.5–1.9)
Lactic Acid, Venous: 2 mmol/L (ref 0.5–1.9)

## 2018-11-21 LAB — APTT: aPTT: 34 seconds (ref 24–36)

## 2018-11-21 LAB — TROPONIN I
TROPONIN I: 0.05 ng/mL — AB (ref <0.03)
TROPONIN I: 0.04 ng/mL — AB (ref <0.03)
Troponin I: 0.06 ng/mL (ref <0.03)

## 2018-11-21 LAB — PROCALCITONIN: PROCALCITONIN: 3.53 ng/mL

## 2018-11-21 LAB — MRSA PCR SCREENING: MRSA by PCR: NEGATIVE

## 2018-11-21 MED ORDER — PROMETHAZINE HCL 6.25 MG/5ML PO SYRP
12.5000 mg | ORAL_SOLUTION | Freq: Four times a day (QID) | ORAL | Status: DC | PRN
  Filled 2018-11-21 (×2): qty 10

## 2018-11-21 MED ORDER — FENTANYL 2500MCG IN NS 250ML (10MCG/ML) PREMIX INFUSION
0.0000 ug/h | INTRAVENOUS | Status: DC
  Administered 2018-11-21: 25 ug/h via INTRAVENOUS
  Filled 2018-11-21: qty 250

## 2018-11-21 MED ORDER — AMIODARONE PEDIATRIC ORAL SUSPENSION 5 MG/ML
100.0000 mg | Freq: Every day | ORAL | Status: DC
  Filled 2018-11-21 (×2): qty 20

## 2018-11-21 MED ORDER — CHLORHEXIDINE GLUCONATE 0.12% ORAL RINSE (MEDLINE KIT)
15.0000 mL | Freq: Two times a day (BID) | OROMUCOSAL | Status: DC
  Administered 2018-11-21 – 2018-11-27 (×10): 15 mL via OROMUCOSAL

## 2018-11-21 MED ORDER — SODIUM CHLORIDE 0.9 % IV SOLN
250.0000 mL | INTRAVENOUS | Status: DC
  Administered 2018-11-21: 250 mL via INTRAVENOUS

## 2018-11-21 MED ORDER — SODIUM CHLORIDE 0.9 % IV BOLUS
500.0000 mL | Freq: Once | INTRAVENOUS | Status: AC
  Administered 2018-11-21: 500 mL via INTRAVENOUS

## 2018-11-21 MED ORDER — HEPARIN SODIUM (PORCINE) 5000 UNIT/ML IJ SOLN
5000.0000 [IU] | Freq: Three times a day (TID) | INTRAMUSCULAR | Status: DC
  Administered 2018-11-21 – 2018-11-27 (×19): 5000 [IU] via SUBCUTANEOUS
  Filled 2018-11-21 (×18): qty 1

## 2018-11-21 MED ORDER — SODIUM CHLORIDE 0.9 % IV SOLN: INTRAVENOUS | Status: DC | PRN

## 2018-11-21 MED ORDER — FENTANYL CITRATE (PF) 100 MCG/2ML IJ SOLN: 50.0000 ug | Freq: Once | INTRAMUSCULAR | Status: DC

## 2018-11-21 MED ORDER — FENTANYL BOLUS VIA INFUSION
50.0000 ug | INTRAVENOUS | Status: DC | PRN
  Administered 2018-11-22 – 2018-11-23 (×3): 50 ug via INTRAVENOUS
  Filled 2018-11-21: qty 50

## 2018-11-21 MED ORDER — ASPIRIN 81 MG PO CHEW
81.0000 mg | CHEWABLE_TABLET | Freq: Every day | ORAL | Status: DC
  Administered 2018-11-21 – 2018-11-27 (×7): 81 mg via ORAL
  Filled 2018-11-21 (×7): qty 1

## 2018-11-21 MED ORDER — ORAL CARE MOUTH RINSE
15.0000 mL | OROMUCOSAL | Status: DC
  Administered 2018-11-21 – 2018-11-23 (×19): 15 mL via OROMUCOSAL

## 2018-11-21 MED ORDER — SODIUM CHLORIDE 0.9 % IV BOLUS (SEPSIS): 1000.0000 mL | Freq: Once | INTRAVENOUS | Status: DC

## 2018-11-21 MED ORDER — POTASSIUM CHLORIDE 10 MEQ/100ML IV SOLN
10.0000 meq | INTRAVENOUS | Status: AC
  Administered 2018-11-21 (×3): 10 meq via INTRAVENOUS
  Filled 2018-11-21 (×3): qty 100

## 2018-11-21 MED ORDER — VANCOMYCIN HCL 10 G IV SOLR
2000.0000 mg | Freq: Once | INTRAVENOUS | Status: AC
  Administered 2018-11-21: 2000 mg via INTRAVENOUS
  Filled 2018-11-21: qty 2000

## 2018-11-21 MED ORDER — PANTOPRAZOLE SODIUM 40 MG IV SOLR
40.0000 mg | Freq: Every day | INTRAVENOUS | Status: DC
  Administered 2018-11-21 – 2018-11-23 (×3): 40 mg via INTRAVENOUS
  Filled 2018-11-21 (×3): qty 40

## 2018-11-21 MED ORDER — SODIUM CHLORIDE 0.9 % IV SOLN
1.0000 g | Freq: Three times a day (TID) | INTRAVENOUS | Status: DC
  Administered 2018-11-21 – 2018-11-24 (×10): 1 g via INTRAVENOUS
  Filled 2018-11-21 (×12): qty 1

## 2018-11-21 MED ORDER — SODIUM CHLORIDE 0.9 % IV SOLN: 2.0000 g | Freq: Once | INTRAVENOUS | Status: DC

## 2018-11-21 MED ORDER — ONDANSETRON HCL 4 MG/2ML IJ SOLN: 4.0000 mg | Freq: Four times a day (QID) | INTRAMUSCULAR | Status: DC | PRN

## 2018-11-21 MED ORDER — VANCOMYCIN HCL IN DEXTROSE 1-5 GM/200ML-% IV SOLN: 1000.0000 mg | Freq: Once | INTRAVENOUS | Status: DC

## 2018-11-21 MED ORDER — ETOMIDATE 2 MG/ML IV SOLN
15.0000 mg | Freq: Once | INTRAVENOUS | Status: AC
  Administered 2018-11-21: 15 mg via INTRAVENOUS

## 2018-11-21 MED ORDER — SODIUM CHLORIDE 0.9 % IV SOLN
1.0000 g | INTRAVENOUS | Status: AC
  Administered 2018-11-21: 1 g via INTRAVENOUS
  Filled 2018-11-21: qty 1

## 2018-11-21 MED ORDER — AMIODARONE PEDIATRIC ORAL SUSPENSION 5 MG/ML
100.0000 mg | Freq: Every day | ORAL | Status: DC
  Administered 2018-11-21 – 2018-11-24 (×3): 100 mg via ORAL
  Filled 2018-11-21 (×6): qty 20

## 2018-11-21 MED ORDER — SODIUM CHLORIDE 0.9 % IV BOLUS (SEPSIS)
1000.0000 mL | Freq: Once | INTRAVENOUS | Status: DC
  Administered 2018-11-21: 1000 mL via INTRAVENOUS

## 2018-11-21 MED ORDER — PRO-STAT SUGAR FREE PO LIQD
30.0000 mL | Freq: Two times a day (BID) | ORAL | Status: DC
  Administered 2018-11-21 – 2018-11-23 (×5): 30 mL
  Filled 2018-11-21 (×5): qty 30

## 2018-11-21 MED ORDER — PHENYLEPHRINE HCL-NACL 10-0.9 MG/250ML-% IV SOLN
25.0000 ug/min | INTRAVENOUS | Status: DC
  Filled 2018-11-21: qty 250

## 2018-11-21 MED ORDER — SODIUM CHLORIDE 0.9 % IV SOLN
INTRAVENOUS | Status: DC
  Administered 2018-11-21 – 2018-11-24 (×5): via INTRAVENOUS

## 2018-11-21 MED ORDER — FENTANYL 2500MCG IN NS 250ML (10MCG/ML) PREMIX INFUSION
25.0000 ug/h | INTRAVENOUS | Status: DC
  Administered 2018-11-22 (×2): 100 ug/h via INTRAVENOUS
  Filled 2018-11-21 (×2): qty 250

## 2018-11-21 MED ORDER — METRONIDAZOLE IN NACL 5-0.79 MG/ML-% IV SOLN: 500.0000 mg | Freq: Three times a day (TID) | INTRAVENOUS | Status: DC

## 2018-11-21 MED ORDER — SODIUM CHLORIDE 0.9% FLUSH
3.0000 mL | Freq: Once | INTRAVENOUS | Status: AC
  Administered 2018-11-21: 3 mL via INTRAVENOUS

## 2018-11-21 MED ORDER — AMIODARONE PEDIATRIC ORAL SUSPENSION 5 MG/ML: 100.0000 mg | Freq: Every day | ORAL | Status: DC

## 2018-11-21 MED ORDER — VANCOMYCIN HCL 10 G IV SOLR
1500.0000 mg | INTRAVENOUS | Status: DC
  Administered 2018-11-22 – 2018-11-23 (×2): 1500 mg via INTRAVENOUS
  Filled 2018-11-21 (×2): qty 1500

## 2018-11-21 MED ORDER — ACETAMINOPHEN 650 MG RE SUPP
650.0000 mg | Freq: Once | RECTAL | Status: AC
  Administered 2018-11-21: 650 mg via RECTAL
  Filled 2018-11-21: qty 1

## 2018-11-21 MED ORDER — FENTANYL CITRATE (PF) 100 MCG/2ML IJ SOLN
50.0000 ug | Freq: Once | INTRAMUSCULAR | Status: AC
  Administered 2018-11-21: 50 ug via INTRAVENOUS

## 2018-11-21 MED ORDER — ACETAMINOPHEN 325 MG PO TABS
650.0000 mg | ORAL_TABLET | ORAL | Status: DC | PRN
  Administered 2018-11-25: 650 mg via ORAL
  Filled 2018-11-21: qty 2

## 2018-11-21 MED ORDER — INSULIN ASPART 100 UNIT/ML ~~LOC~~ SOLN
0.0000 [IU] | SUBCUTANEOUS | Status: DC
  Administered 2018-11-21 (×2): 1 [IU] via SUBCUTANEOUS
  Administered 2018-11-21 – 2018-11-22 (×3): 2 [IU] via SUBCUTANEOUS
  Administered 2018-11-22 – 2018-11-23 (×10): 1 [IU] via SUBCUTANEOUS

## 2018-11-21 MED ORDER — SODIUM CHLORIDE 0.9 % IV BOLUS (SEPSIS)
1000.0000 mL | Freq: Once | INTRAVENOUS | Status: AC
  Administered 2018-11-21: 1000 mL via INTRAVENOUS

## 2018-11-21 MED ORDER — PANTOPRAZOLE SODIUM 40 MG IV SOLR: 40.0000 mg | Freq: Every day | INTRAVENOUS | Status: DC

## 2018-11-21 MED ORDER — VITAL AF 1.2 CAL PO LIQD
1000.0000 mL | ORAL | Status: DC
  Administered 2018-11-21 – 2018-11-23 (×2): 1000 mL

## 2018-11-21 MED ORDER — SUCCINYLCHOLINE CHLORIDE 20 MG/ML IJ SOLN
1.5000 mg/kg | Freq: Once | INTRAMUSCULAR | Status: AC
  Administered 2018-11-21: 142.6 mg via INTRAVENOUS
  Filled 2018-11-21: qty 7.13

## 2018-11-21 NOTE — Progress Notes (Signed)
Pharmacy Antibiotic Note  William [name] is a 85 y.o. male admitted on 11/21/2018 with sepsis - pt with h/o ESBL bacteremia.  Pharmacy has been consulted for Vancomycin and Meropenem dosing. Also on Flagyl.  Plan: Meropenem 1gm IV q8h Vancomycin 2gm IV load then 1500 mg IV Q 24 hrs. Goal AUC 400-550. Expected AUC: 467 SCr used: 1.04 Will f/u renal function, micro data, and pt's clinical condition Vanc levels prn   Height: 5\' 10"  (177.8 cm) Weight: 209 lb 7 oz (95 kg) IBW/kg (Calculated) : 73  Temp (24hrs), Avg:100.6 F (38.1 C), Min:100.6 F (38.1 C), Max:100.6 F (38.1 C)  No results for input(s): WBC, CREATININE, LATICACIDVEN, VANCOTROUGH, VANCOPEAK, VANCORANDOM, GENTTROUGH, GENTPEAK, GENTRANDOM, TOBRATROUGH, TOBRAPEAK, TOBRARND, AMIKACINPEAK, AMIKACINTROU, AMIKACIN in the last 168 hours.  CrCl cannot be calculated (Patient's most recent lab result is older than the maximum 21 days allowed.).    Allergies  Allergen Reactions  . Crestor [Rosuvastatin] Other (See Comments)    Hurts muscles  . Lipitor [Atorvastatin] Other (See Comments)    Hurts stomach  . Shrimp [Shellfish Allergy] Other (See Comments)    On MAR    Antimicrobials this admission: 2/7 Vanc >>  2/7 Meropenem >>  2/7 Flagyl>>  Microbiology results: 2/8 BCx x4:   Thank you for allowing pharmacy to be a part of this patient's care.  Sherlon Handing, PharmD, BCPS Clinical pharmacist  **Pharmacist phone directory can now be found on [email or web address] ([email or web address]).  Listed under [place]. 11/21/2018 5:27 AM

## 2018-11-21 NOTE — Procedures (Signed)
Arterial Catheter Insertion Procedure Note [name] [id number]  Procedure: Insertion of Arterial Catheter  Indications: Blood pressure monitoring and Frequent blood sampling  Procedure Details Consent: Unable to obtain consent because of altered level of consciousness. Time Out: Verified patient identification, verified procedure, site/side was marked, verified correct patient position, special equipment/implants available, medications/allergies/relevent history reviewed, required imaging and test results available.  Performed  Maximum sterile technique was used including antiseptics, cap, gloves, gown, hand hygiene, mask and sheet. Skin prep: Chlorhexidine; local anesthetic administered 20 gauge catheter was inserted into right radial artery using the Seldinger technique. ULTRASOUND GUIDANCE USED: NO Evaluation Blood flow good; BP tracing good. Complications: No apparent complications. Placed arrow catheter on first attempt.  William [name] 11/21/2018

## 2018-11-21 NOTE — H&P (Addendum)
NAME:  William [name], MRN:  [id number], DOB:  1928-01-28, LOS: 0 ADMISSION DATE:  11/21/2018,  REFERRING MD: edp, CHIEF COMPLAINT:  sepsis  Brief History   85 year old presenting with shock presumed sepsis  History of present illness   85 year old male nursing home resident who has extensive past medical history as well documented below who presents with approximately 15 hours of shakes chills sweats fever and vomiting x1 with questionable aspiration.  He is evaluated emergency department by emergency department physician found to be altered mental status and unable to protect his airway and was intubated.  Was also found to be hypotensive presumed septic started on sepsis protocol fluid resuscitated pulmonary critical care called to bedside to admit.  Past Medical History  Recent history of bacteremia with Klebsiella ESBL requiring peripheral surgical venous catheter in 6 weeks of anticoagulant therapy Tvar January 2020 Ischemic cardiomyopathy History of AICD placement with removal secondary to bacteremia AAA repair Hypertension Prostate cancer Ventricular tachycardia Significant Hospital Events   11/21/2018 admitted to the hospital for respiratory failure  Consults:    Procedures:  11/21/2018 intubated by the emergency department physician.  Significant Diagnostic Tests:  11/21/2018 2D echo ordered>> 11/21/2020 the abdomen>>  Micro Data:  11/21/2018 urine culture 11/21/2018 respiratory culture 11/21/2018 blood cultures x2 11/21/18 flu a/b>> 11/21/18 rvp>>  Antimicrobials:  November 21 2018 meropenem>> February 2020 vancomycin>> Interim history/subjective:  85 year old male with extensive past medical history who presents with presumed sepsis shock vent dependent respiratory failure  Objective   Blood pressure 94/65, pulse (!) 115, temperature (!) 100.6 F (38.1 C), temperature source Rectal, resp. rate (!) 0, height 5\' 11"  (1.803 m), weight 95 kg, SpO2 100 %.    Vent Mode:  PRVC FiO2 (%):  [100 %] 100 % Set Rate:  [24 bmp] 24 bmp Vt Set:  [600 mL] 600 mL PEEP:  [5 cmH20] 5 cmH20 Plateau Pressure:  [14 cmH20] 14 cmH20   Intake/Output Summary (Last 24 hours) at 11/21/2018 0756 Last data filed at 11/21/2018 9528 Gross per 24 hour  Intake 1100 ml  Output 100 ml  Net 1000 ml   Filed Weights   11/21/18 0512  Weight: 95 kg    Examination: General:.  Frail elderly male who is currently intubated and sedated HENT: Endotracheal tube is in place, orogastric tube is in place Lungs: Decreased breath sounds in the bases Cardiovascular: Heart sounds are regular with no murmur Abdomen: Abdomen soft nondistended nontender Extremities: 1+ edema lower extremities Neuro: Currently sedated with fentanyl drip following intubation GU: Foley catheter to be placed  Resolved Hospital Problem list     Assessment & Plan:  Vent dependent respiratory failure secondary to sepsis with a history of sleep apnea cleared with weight loss of 60 pounds Vent bundle Wean FiO2 after reviewing arterial blood gases Daily wake-up assessment Pulmonary toilet Sedation protocol for tube tolerance  Shock in the setting of presumed sepsis reported fever of 101, rigors, abdominal pain, questionable aspiration.  Recent history of ESBL Klebsiella bacteremia require PICC line and IV antimicrobial therapy.  Now with presumed sepsis of unknown source at this time. Panculture Day 0 of vancomycin and meropenem Sepsis protocol initiated 11/21/2018 Vasopressor support utilizing Neo-Synephrine due to history of ventricular tachycardia Check procalcitonin Lactic acid 2.8 Fluid resuscitation with 2 L normal saline.  Would not go higher due to the cardiac history.  Continues Neo-Synephrine and normal saline. 2D echo for completeness Check virus panel for completeness  Extensive cardiac history and includes TAVR January  2020, AICD which was removed due to bacteremia.  History of ventricular tachycardia  required shock when AICD was in place.  Severe valvular stenosis.  Nonischemic cardiomyopathy  utilize Neo-Synephrine rather than Levophed for pressor support Check 2D echo Check twelve-lead Check troponin for completeness May consider cardiac consult near future  Hypokalemia Replete potassium  Hyperglycemia Sliding scale insulin protocol   Global: Admit to intensive care unit with vent dependent respiratory failure secondary to shock with presumed sepsis.  Most likely needing no central line for IV access along with measuring central venous pressure.  Plus or minus an arterial line depending on his response to fluid and Neo-Synephrine.  He is a full code and requested family     Best practice:  Diet: N.p.o. Pain/Anxiety/Delirium protocol (if indicated): Fentanyl VAP protocol (if indicated): In place DVT prophylaxis: Heparin GI prophylaxis: Protonix Glucose control: Sliding scale insulin Mobility: None bedrest Code Status: Full code Family Communication: Wife and daughter updated at bedside to 05/2019 Disposition: Admit to intensive care unit to pulmonary critical care service  Labs   CBC: Recent Labs  Lab 11/21/18 0521 11/21/18 0655  WBC 14.1*  --   NEUTROABS 12.6*  --   HGB 12.3* 12.9*  HCT 36.3* 38.0*  MCV 93.3  --   PLT 163  --     Basic Metabolic Panel: Recent Labs  Lab 11/21/18 0521 11/21/18 0655  NA 135 136  K 3.3* 3.3*  CL 103  --   CO2 23  --   GLUCOSE 241*  --   BUN 20  --   CREATININE 1.04  --   CALCIUM 8.0*  --    GFR: Estimated Creatinine Clearance: 56.7 mL/min (by C-G formula based on SCr of 1.04 mg/dL). Recent Labs  Lab 11/21/18 0521 11/21/18 0522  WBC 14.1*  --   LATICACIDVEN  --  2.3*    Liver Function Tests: Recent Labs  Lab 11/21/18 0521  AST 23  ALT 21  ALKPHOS 91  BILITOT 1.3*  PROT 6.5  ALBUMIN 2.2*   No results for input(s): LIPASE, AMYLASE in the last 168 hours. No results for input(s): AMMONIA in the last 168  hours.  ABG    Component Value Date/Time   PHART 7.420 11/21/2018 0655   PCO2ART 30.7 (L) 11/21/2018 0655   PO2ART 446.0 (H) 11/21/2018 0655   HCO3 19.7 (L) 11/21/2018 0655   TCO2 21 (L) 11/21/2018 0655   ACIDBASEDEF 3.0 (H) 11/21/2018 0655   O2SAT 100.0 11/21/2018 0655     Coagulation Profile: Recent Labs  Lab 11/21/18 0521  INR 1.20    Cardiac Enzymes: No results for input(s): CKTOTAL, CKMB, CKMBINDEX, TROPONINI in the last 168 hours.  HbA1C: Hgb A1c MFr Bld  Date/Time Value Ref Range Status  08/13/2018 04:09 AM 5.2 4.8 - 5.6 % Final    Comment:    (NOTE) Pre diabetes:          5.7%-6.4% Diabetes:              >6.4% Glycemic control for   <7.0% adults with diabetes     CBG: No results for input(s): GLUCAP in the last 168 hours.  Review of Systems:   na  Past Medical History  He,  has a past medical history of AAA (abdominal aortic aneurysm) ([place]), Arthritis, Asthma, Chronic systolic CHF (congestive heart failure) ([place]), Dementia ([place]), HLD (hyperlipidemia), HOH (hard of hearing), Lumbar vertebral fracture [facility]), Prostate cancer ([place]), S/P ICD (internal cardiac defibrillator) procedure, 01/11/14 removal  of ERI gen and placement of Medtronic Evera XT VR & NEW Right ventricular lead Medtronic (01/12/2014), S/P TAVR (transcatheter aortic valve replacement), Severe aortic stenosis, Sleep apnea, and Ventricular tachycardia ([place]).   Surgical History    Past Surgical History:  Procedure Laterality Date  . CARDIAC CATHETERIZATION  08/20/2004   noncritical CAD,mild global hypokinesis, EF 50%  . CARDIAC DEFIBRILLATOR PLACEMENT  08/23/2004   Medtronic  . CATARACT EXTRACTION W/ INTRAOCULAR LENS IMPLANT Right   . COLECTOMY  1990's  . CRYOABLATION N/A 02/24/2014   Procedure: CRYO ABLATION PROSTATE;  Surgeon: Ailene Rud, MD;  Location: [facility];  Service: Urology;  Laterality: N/A;  . HERNIA REPAIR     "abdomen; from colon OR"  . ICD LEAD REMOVAL N/A 09/15/2018    Procedure: ICD LEAD REMOVAL EXTRACTION;  Surgeon: Evans Lance, MD;  Location: [facility] OR;  Service: Cardiovascular;  Laterality: N/A;  DR. BARTLE TO BACK UP  . IMPLANTABLE CARDIOVERTER DEFIBRILLATOR (ICD) GENERATOR CHANGE N/A 01/11/2014   Procedure: ICD GENERATOR CHANGE;  Surgeon: Sanda Klein, MD;  Location: [place] CATH LAB;  Service: Cardiovascular;  Laterality: N/A;  . LEAD REVISION N/A 01/11/2014   Procedure: LEAD REVISION;  Surgeon: Sanda Klein, MD;  Location: [facility] CATH LAB;  Service: Cardiovascular;  Laterality: N/A;  . NM MYOCAR PERF WALL MOTION  01/29/2012   abnormal c/o infarct/scar,no ischemia present  . PROSTATE BIOPSY N/A 11/22/2013   Procedure: PROSTATE BIOPSY AND ULTRASOUND;  Surgeon: Ailene Rud, MD;  Location: [facility];  Service: Urology;  Laterality: N/A;  . RIGHT/LEFT HEART CATH AND CORONARY ANGIOGRAPHY N/A 07/06/2018   Procedure: RIGHT/LEFT HEART CATH AND CORONARY ANGIOGRAPHY;  Surgeon: Troy Sine, MD;  Location: [facility] CV LAB;  Service: Cardiovascular;  Laterality: N/A;  . TEE WITHOUT CARDIOVERSION N/A 08/18/2018   Procedure: TRANSESOPHAGEAL ECHOCARDIOGRAM (TEE);  Surgeon: Burnell Blanks, MD;  Location: [place];  Service: Open Heart Surgery;  Laterality: N/A;  . TEE WITHOUT CARDIOVERSION N/A 09/08/2018   Procedure: TRANSESOPHAGEAL ECHOCARDIOGRAM (TEE);  Surgeon: Lelon Perla, MD;  Location: [facility]OSCOPY;  Service: Cardiovascular;  Laterality: N/A;  . TEE WITHOUT CARDIOVERSION N/A 09/15/2018   Procedure: TRANSESOPHAGEAL ECHOCARDIOGRAM (TEE);  Surgeon: Evans Lance, MD;  Location: [place];  Service: Cardiovascular;  Laterality: N/A;  . TRANSCATHETER AORTIC VALVE REPLACEMENT, TRANSFEMORAL  08/18/2018  . TRANSCATHETER AORTIC VALVE REPLACEMENT, TRANSFEMORAL N/A 08/18/2018   Procedure: TRANSCATHETER AORTIC VALVE REPLACEMENT, TRANSFEMORAL using an Edwards 50mm Aortic Valve;  Surgeon: Burnell Blanks, MD;  Location: [place];  Service: Open Heart Surgery;   Laterality: N/A;     Social History   reports that he has never smoked. He has never used smokeless tobacco. He reports current alcohol use. He reports that he does not use drugs.   Family History   His family history includes Heart failure in his mother; Pneumonia in his father.   Allergies Allergies  Allergen Reactions  . Crestor [Rosuvastatin] Other (See Comments)    Hurts muscles  . Lipitor [Atorvastatin] Other (See Comments)    Hurts stomach  . Shrimp [Shellfish Allergy] Other (See Comments)    On MAR     Home Medications  Prior to Admission medications   Medication Sig Start Date End Date Taking? Authorizing Provider  acetaminophen (TYLENOL) 500 MG tablet Take 500 mg by mouth every 6 (six) hours as needed (for pain).     [provider]  amiodarone (PACERONE) 200 MG tablet Take 0.5 tablets (100 mg total) by mouth  daily. 11/16/18   Croitoru, Mihai, MD  aspirin 81 MG chewable tablet Chew 1 tablet (81 mg total) by mouth daily. 07/08/18   Kroeger, Lorelee Cover., PA-C  cholecalciferol (VITAMIN D) 1000 units tablet Take 2,000 Units by mouth at bedtime.    [provider]  clopidogrel (PLAVIX) 75 MG tablet Take 1 tablet (75 mg total) by mouth daily with breakfast. 08/21/18   Eileen Stanford, PA-C  ezetimibe-simvastatin (VYTORIN) 10-20 MG per tablet Take 1 tablet by mouth at bedtime.    [provider]  metoprolol succinate (TOPROL-XL) 25 MG 24 hr tablet Take 1 tablet (25 mg total) by mouth daily. Take with or immediately following a meal. 09/18/18   Nita Sells, MD  Multiple Vitamins-Minerals (PRESERVISION AREDS 2 PO) Take 1 capsule by mouth daily.     [provider]  polyethylene glycol (MIRALAX / GLYCOLAX) packet Take 17 g by mouth every evening. Patient taking differently: Take 1 g by mouth every evening. 1 teaspoon daily 01/21/17   Croitoru, Mihai, MD  triamcinolone (KENALOG) 0.025 % cream Apply 1 application topically 2 (two) times daily.  Until resolved    [provider]     60 min app cct    Richardson Landry Jamera Vanloan ACNP Maryanna Shape PCCM Pager [phone] till 1 pm If no answer page [phone] 11/21/2018, 7:56 AM

## 2018-11-21 NOTE — Progress Notes (Signed)
PHARMACY - PHYSICIAN COMMUNICATION CRITICAL VALUE ALERT - BLOOD CULTURE IDENTIFICATION (BCID)  William [name] is an 85 y.o. male who presented to [facility] on 11/21/2018 with a chief complaint of AMS  Assessment:  39 YOM from rehab with recent TAVR on 08/2018. Postop course complicated by UTI and ESBL klebsiella bacteremia with vegetation of PPM leads which needed extraction. He completed 6 week ertapenem course. Now with bilious vomiting. Intubated after arrival due to AMS and inability to protect airway. Concern for sepsis from UTI vs. Acute cholecystitis. One bottle grew out GNRs. BCID shows enterobacter cloacae.   Name of physician (or Provider) Contacted: Dr. Oletta Darter  Current antibiotics: Meropenem & Vancomycin  Changes to prescribed antibiotics recommended:  Patient is on recommended antibiotics - No changes needed  Results for orders placed or performed during the hospital encounter of 09/04/18  Blood Culture ID Panel (Reflexed) (Collected: 09/04/2018  2:25 PM)  Result Value Ref Range   Enterococcus species NOT DETECTED NOT DETECTED   Listeria monocytogenes NOT DETECTED NOT DETECTED   Staphylococcus species NOT DETECTED NOT DETECTED   Staphylococcus aureus (BCID) NOT DETECTED NOT DETECTED   Streptococcus species NOT DETECTED NOT DETECTED   Streptococcus agalactiae NOT DETECTED NOT DETECTED   Streptococcus pneumoniae NOT DETECTED NOT DETECTED   Streptococcus pyogenes NOT DETECTED NOT DETECTED   Acinetobacter baumannii NOT DETECTED NOT DETECTED   Enterobacteriaceae species DETECTED (A) NOT DETECTED   Enterobacter cloacae complex NOT DETECTED NOT DETECTED   Escherichia coli NOT DETECTED NOT DETECTED   Klebsiella oxytoca NOT DETECTED NOT DETECTED   Klebsiella pneumoniae DETECTED (A) NOT DETECTED   Proteus species NOT DETECTED NOT DETECTED   Serratia marcescens NOT DETECTED NOT DETECTED   Carbapenem resistance NOT DETECTED NOT DETECTED   Haemophilus influenzae NOT DETECTED NOT  DETECTED   Neisseria meningitidis NOT DETECTED NOT DETECTED   Pseudomonas aeruginosa NOT DETECTED NOT DETECTED   Candida albicans NOT DETECTED NOT DETECTED   Candida glabrata NOT DETECTED NOT DETECTED   Candida krusei NOT DETECTED NOT DETECTED   Candida parapsilosis NOT DETECTED NOT DETECTED   Candida tropicalis NOT DETECTED NOT DETECTED    Albertina Parr, PharmD., BCPS Clinical Pharmacist Clinical phone for 11/21/18 until 8:30pm: [phone] If after 8:30pm, please refer to [facility] for unit-specific pharmacist

## 2018-11-21 NOTE — Progress Notes (Signed)
Initial Nutrition Assessment  DOCUMENTATION CODES:   Not applicable  INTERVENTION:   -Vital AF 1.2 @ 20 ml/hr via OGT -Increase by 10 ml Q8 hours to goal rate of 60 ml/hr (1440 ml) -30 ml Prostat BID  At goal TF provides: 1928 kcals, 138 grams protein, 1168 ml free water. Meets 102% of kcal and protein needs.   Monitor magnesium, potassium, and phosphorus daily for at least 3 days, MD to replete as needed, as pt is at risk for refeeding syndrome.   NUTRITION DIAGNOSIS:   Increased nutrient needs related to wound healing as evidenced by estimated needs.  GOAL:   Provide needs based on ASPEN/SCCM guidelines  MONITOR:   Diet advancement, Skin, TF tolerance, Weight trends, Labs, I & O's, Vent status  REASON FOR ASSESSMENT:   Ventilator    ASSESSMENT:   Patient with PMH significant for ischemic cardiomyopathy, s/p TAVR 08/2018, AAA repair, HTN, prostate cancer, and recent UTI/bacteremia with vegetation on PPM leads. Admitted for septic shock secondary to UTI and possible acute cholecystitis.    Per CCM, okay to start TF. Will titrate rate, as pt is at risk for refeeding.   Daughter at bedside reports pt was eating three meals daily provided by rehab facility until this week. States three days ago he became nauseous upon eating and had multiple vomiting episodes. She endorses pt's UBW stayed around 210 lb. Reports significant fluid fluctuations during each hospital admission.   Per records, pt looked to have weighed 231 lb on 07/07/18 and 209 lb this admission. Suspect pt has lost dry weight but unable to determine given history of fluid fluctuation reported by daughter.   Patient is currently intubated on ventilator support MV: 10.8 L/min Temp (24hrs), Avg:98.8 F (37.1 C), Min:97.9 F (36.6 C), Max:100.6 F (38.1 C) BP: 99/51 MAP: 64 - A line   Propofol: none  I/O: +3016 ml since admit UOP: none recorded OGT: 100 ml x 24 hrs   Medications reviewed and include: SS  novolog, NS @ 100 ml/hr, holding neo Labs reviewed: K 3.1 (K) calcium 1.14 (L) Phosphorus 1.9 (L) CBG 175-189  NUTRITION - FOCUSED PHYSICAL EXAM:    Most Recent Value  Orbital Region  No depletion  Upper Arm Region  Mild depletion [edematous]  Thoracic and Lumbar Region  Unable to assess  Buccal Region  Unable to assess  Temple Region  Moderate depletion  Clavicle Bone Region  Mild depletion  Clavicle and Acromion Bone Region  Mild depletion  Scapular Bone Region  Unable to assess  Dorsal Hand  Unable to assess [mits]  Patellar Region  No depletion  Anterior Thigh Region  No depletion  Posterior Calf Region  Unable to assess  Edema (RD Assessment)  Moderate  Hair  Reviewed  Eyes  Unable to assess  Mouth  Unable to assess  Skin  Reviewed  Nails  Unable to assess     Diet Order:   Diet Order            Diet NPO time specified  Diet effective now              EDUCATION NEEDS:   Not appropriate for education at this time  Skin:  Skin Assessment: Skin Integrity Issues: Skin Integrity Issues:: Stage II Stage II: buttocks  Last BM:  PTA  Height:   Ht Readings from Last 1 Encounters:  11/21/18 5\' 11"  (1.803 m)    Weight:   Wt Readings from Last 1 Encounters:  11/21/18  95 kg    Ideal Body Weight:  78.2 kg  BMI:  Body mass index is 29.21 kg/m.  Estimated Nutritional Needs:   Kcal:  1890 kcal  Protein:  120-135 grams  Fluid:  >/= 1.9 L/day    Mariana Single RD, LDN Clinical Nutrition Pager # - [phone]

## 2018-11-21 NOTE — ED Triage Notes (Signed)
Pt bib [facility] from [facility] d/t suspected sepsis.  Pt has been febrile, tachycardic.  Pt is prone to sepsis per EMS.  Facility wanted pt transferred out however family did not.  Pt is a full code.  Pt w/ hx of dementia.  Oriented at baseline.

## 2018-11-21 NOTE — Progress Notes (Signed)
Found ETT at 26 at lip, pulled back to 23 at lip per MD order. Per ED MD, wait 1 hour for ABG.   Spoke w/ CCM re: vent settings and orders.  Pt placed on MD ordered vent settings.  Unit RT given report. Pt transported to CT then 53M on vent w/ no apparent complications.

## 2018-11-21 NOTE — ED Provider Notes (Signed)
[facility] EMERGENCY DEPARTMENT Provider Note   CSN: [id number] Arrival date & time: 11/21/18  0505     History   Chief Complaint Chief Complaint  Patient presents with  . Code Sepsis    HPI William [name] is a 85 y.o. male.  HPI 85 year old male here with respiratory distress.  The patient is unable to provide history.  Per report, the patient has history of mild dementia, recent admissions for ESBL Klebsiella.  He had been recovering well, until the last 24 hours.  Yesterday, he vomited once and had mild abdominal pain.  He had no further episodes of emesis and was able to eat at night.  On awakening this morning around 3 AM, he began to complain of severe shortness of breath.  He was found markedly tachypneic and confused.  He was subsequent brought to the ED.  On my assessment, the patient is in obvious respiratory distress and unable to provide any further history.  He is moaning.  Level 5 caveat invoked as remainder of history, ROS, and physical exam limited due to patient's AMS, work of breathing, resp distress.   Past Medical History:  Diagnosis Date  . AAA (abdominal aortic aneurysm) ([place])    7/13 3.8cm  . Arthritis    "joints" (01/11/2014)  . Asthma    "seasonal; some foods"   . Chronic systolic CHF (congestive heart failure) ([place])   . Dementia ([place])   . HLD (hyperlipidemia)   . HOH (hard of hearing)   . Lumbar vertebral fracture ([facility])    L1- 04/11/2014   . Prostate cancer ([place])   . S/P ICD (internal cardiac defibrillator) procedure, 01/11/14 removal of ERI gen and placement of Medtronic Evera XT VR & NEW Right ventricular lead Medtronic 01/12/2014  . S/P TAVR (transcatheter aortic valve replacement)    Edwards Sapien 3 THV (size 29 mm, model # [id number], serial # [id number])  . Severe aortic stenosis   . Sleep apnea    "lost 60# & don't have it anymore" (01/11/2014)  . Ventricular tachycardia [facility])     Patient Active Problem List   Diagnosis  Date Noted  . Coronary artery disease involving native coronary artery of native heart without angina pectoris 09/22/2018  . Closed fracture of lower end of right ulna 09/22/2018  . Weakness acquired in ICU 09/22/2018  . Acute bacterial endocarditis   . Infected defibrillator ([place])   . Bacteremia due to Klebsiella pneumoniae   . Sepsis ([place]) 09/04/2018  . S/P TAVR (transcatheter aortic valve replacement) 08/18/2018  . Ventricular tachycardia ([place])   . Dementia ([place])   . Chronic systolic CHF (congestive heart failure) ([place]) 08/07/2018  . Severe aortic stenosis   . Acute on chronic systolic heart failure ([place]) 07/02/2018  . Prostate cancer ([place]) 01/20/2014  . S/P ICD (internal cardiac defibrillator) procedure, 01/11/14 removal of ERI gen and placement of Medtronic Evera XT VR & NEW Right ventricular lead Medtronic 01/12/2014  . Nonischemic cardiomyopathy ([place]) 05/28/2013  . Hyperlipidemia 05/28/2013  . AAA (abdominal aortic aneurysm) ([place]) 05/28/2013    Past Surgical History:  Procedure Laterality Date  . CARDIAC CATHETERIZATION  08/20/2004   noncritical CAD,mild global hypokinesis, EF 50%  . CARDIAC DEFIBRILLATOR PLACEMENT  08/23/2004   Medtronic  . CATARACT EXTRACTION W/ INTRAOCULAR LENS IMPLANT Right   . COLECTOMY  1990's  . CRYOABLATION N/A 02/24/2014   Procedure: CRYO ABLATION PROSTATE;  Surgeon: Ailene Rud, MD;  Location: [facility] ORS;  Service: Urology;  Laterality:  N/A;  . HERNIA REPAIR     "abdomen; from colon OR"  . ICD LEAD REMOVAL N/A 09/15/2018   Procedure: ICD LEAD REMOVAL EXTRACTION;  Surgeon: Evans Lance, MD;  Location: [facility] OR;  Service: Cardiovascular;  Laterality: N/A;  DR. BARTLE TO BACK UP  . IMPLANTABLE CARDIOVERTER DEFIBRILLATOR (ICD) GENERATOR CHANGE N/A 01/11/2014   Procedure: ICD GENERATOR CHANGE;  Surgeon: Sanda Klein, MD;  Location: [facility] CATH LAB;  Service: Cardiovascular;  Laterality: N/A;  . LEAD REVISION N/A 01/11/2014   Procedure: LEAD REVISION;   Surgeon: Sanda Klein, MD;  Location: [facility] CATH LAB;  Service: Cardiovascular;  Laterality: N/A;  . NM MYOCAR PERF WALL MOTION  01/29/2012   abnormal c/o infarct/scar,no ischemia present  . PROSTATE BIOPSY N/A 11/22/2013   Procedure: PROSTATE BIOPSY AND ULTRASOUND;  Surgeon: Ailene Rud, MD;  Location: [facility] ORS;  Service: Urology;  Laterality: N/A;  . RIGHT/LEFT HEART CATH AND CORONARY ANGIOGRAPHY N/A 07/06/2018   Procedure: RIGHT/LEFT HEART CATH AND CORONARY ANGIOGRAPHY;  Surgeon: Troy Sine, MD;  Location: [facility] CV LAB;  Service: Cardiovascular;  Laterality: N/A;  . TEE WITHOUT CARDIOVERSION N/A 08/18/2018   Procedure: TRANSESOPHAGEAL ECHOCARDIOGRAM (TEE);  Surgeon: Burnell Blanks, MD;  Location: [facility];  Service: Open Heart Surgery;  Laterality: N/A;  . TEE WITHOUT CARDIOVERSION N/A 09/08/2018   Procedure: TRANSESOPHAGEAL ECHOCARDIOGRAM (TEE);  Surgeon: Lelon Perla, MD;  Location: [facility] ENDOSCOPY;  Service: Cardiovascular;  Laterality: N/A;  . TEE WITHOUT CARDIOVERSION N/A 09/15/2018   Procedure: TRANSESOPHAGEAL ECHOCARDIOGRAM (TEE);  Surgeon: Evans Lance, MD;  Location: [facility];  Service: Cardiovascular;  Laterality: N/A;  . TRANSCATHETER AORTIC VALVE REPLACEMENT, TRANSFEMORAL  08/18/2018  . TRANSCATHETER AORTIC VALVE REPLACEMENT, TRANSFEMORAL N/A 08/18/2018   Procedure: TRANSCATHETER AORTIC VALVE REPLACEMENT, TRANSFEMORAL using an Edwards 18mm Aortic Valve;  Surgeon: Burnell Blanks, MD;  Location: [facility];  Service: Open Heart Surgery;  Laterality: N/A;        Home Medications    Prior to Admission medications   Medication Sig Start Date End Date Taking? Authorizing Provider  acetaminophen (TYLENOL) 500 MG tablet Take 500 mg by mouth every 6 (six) hours as needed (for pain).     [provider]  amiodarone (PACERONE) 200 MG tablet Take 0.5 tablets (100 mg total) by mouth daily. 11/16/18   Croitoru, Mihai, MD  aspirin 81 MG chewable tablet Chew 1  tablet (81 mg total) by mouth daily. 07/08/18   Kroeger, Lorelee Cover., PA-C  cholecalciferol (VITAMIN D) 1000 units tablet Take 2,000 Units by mouth at bedtime.    [provider]  clopidogrel (PLAVIX) 75 MG tablet Take 1 tablet (75 mg total) by mouth daily with breakfast. 08/21/18   Eileen Stanford, PA-C  ezetimibe-simvastatin (VYTORIN) 10-20 MG per tablet Take 1 tablet by mouth at bedtime.    [provider]  metoprolol succinate (TOPROL-XL) 25 MG 24 hr tablet Take 1 tablet (25 mg total) by mouth daily. Take with or immediately following a meal. 09/18/18   Nita Sells, MD  Multiple Vitamins-Minerals (PRESERVISION AREDS 2 PO) Take 1 capsule by mouth daily.     [provider]  polyethylene glycol (MIRALAX / GLYCOLAX) packet Take 17 g by mouth every evening. Patient taking differently: Take 1 g by mouth every evening. 1 teaspoon daily 01/21/17   Croitoru, Mihai, MD  triamcinolone (KENALOG) 0.025 % cream Apply 1 application topically 2 (two) times daily. Until resolved    [provider]  Family History Family History  Problem Relation Age of Onset  . Heart failure Mother        Died of "old age" at 10  . Pneumonia Father     Social History Social History   Tobacco Use  . Smoking status: Never Smoker  . Smokeless tobacco: Never Used  Substance Use Topics  . Alcohol use: Yes    Alcohol/week: 0.0 standard drinks    Comment: 01/11/2014 "drink 1/2 of a beer twice per month   . Drug use: No     Allergies   Crestor [rosuvastatin]; Lipitor [atorvastatin]; and Shrimp [shellfish allergy]   Review of Systems Review of Systems  Unable to perform ROS: Mental status change  Constitutional: Positive for fatigue.  Respiratory: Positive for shortness of breath.   Neurological: Positive for weakness.  Psychiatric/Behavioral: Positive for confusion.     Physical Exam Updated Vital Signs BP (!) 154/103   Pulse (!) 135   Temp (!) 100.6 F (38.1  C) (Rectal)   Resp (!) 28   Ht 5\' 11"  (1.803 m)   Wt 95 kg   SpO2 100%   BMI 29.21 kg/m   Physical Exam Vitals signs and nursing note reviewed.  Constitutional:      General: He is in acute distress.     Appearance: He is well-developed. He is ill-appearing and toxic-appearing.  HENT:     Head: Normocephalic and atraumatic.  Eyes:     Conjunctiva/sclera: Conjunctivae normal.  Neck:     Musculoskeletal: Neck supple.  Cardiovascular:     Rate and Rhythm: Regular rhythm. Tachycardia present.     Heart sounds: Normal heart sounds. No murmur. No friction rub.  Pulmonary:     Effort: Tachypnea, accessory muscle usage and respiratory distress present.     Breath sounds: Decreased air movement present. Decreased breath sounds present. No wheezing or rales.  Abdominal:     General: There is no distension.     Palpations: Abdomen is soft.     Tenderness: There is no abdominal tenderness.  Skin:    General: Skin is warm.     Capillary Refill: Capillary refill takes less than 2 seconds.  Neurological:     Mental Status: He is alert and oriented to person, place, and time.     Motor: No abnormal muscle tone.      ED Treatments / Results  Labs (all labs ordered are listed, but only abnormal results are displayed) Labs Reviewed  COMPREHENSIVE METABOLIC PANEL - Abnormal; Notable for the following components:      Result Value   Potassium 3.3 (*)    Glucose, Bld 241 (*)    Calcium 8.0 (*)    Albumin 2.2 (*)    Total Bilirubin 1.3 (*)    All other components within normal limits  LACTIC ACID, PLASMA - Abnormal; Notable for the following components:   Lactic Acid, Venous 2.3 (*)    All other components within normal limits  CBC WITH DIFFERENTIAL/PLATELET - Abnormal; Notable for the following components:   WBC 14.1 (*)    RBC 3.89 (*)    Hemoglobin 12.3 (*)    HCT 36.3 (*)    Neutro Abs 12.6 (*)    Lymphs Abs 0.5 (*)    Abs Immature Granulocytes 0.08 (*)    All other  components within normal limits  CULTURE, BLOOD (ROUTINE X 2)  CULTURE, BLOOD (ROUTINE X 2)  PROTIME-INR  LACTIC ACID, PLASMA  URINALYSIS, ROUTINE W REFLEX MICROSCOPIC  INFLUENZA PANEL BY PCR (TYPE A & B)    EKG EKG Interpretation  Date/Time:  Saturday November 21 2018 05:07:59 EST Ventricular Rate:  117 PR Interval:    QRS Duration: 133 QT Interval:  396 QTC Calculation: 553 R Axis:   -61 Text Interpretation:  Sinus tachycardia Probable left atrial enlargement Left bundle branch block Since last EKG, rate is significantly increased Confirmed by Duffy Bruce [phone]) on 11/21/2018 5:55:27 AM   Radiology Dg Chest Port 1 View  Result Date: 11/21/2018 CLINICAL DATA:  Sepsis. EXAM: PORTABLE CHEST 1 VIEW COMPARISON:  09/16/2018 FINDINGS: The heart is enlarged but stable. Marked tortuosity and calcification of the thoracic aorta. Bibasilar atelectasis. No definite pleural effusions. No obvious infiltrates. IMPRESSION: Cardiac enlargement and marked tortuosity and calcification of the thoracic aorta. No definite infiltrates, effusions or edema. Streaky basilar atelectasis. Electronically Signed   By: Marijo Sanes M.D.   On: 11/21/2018 06:21    Procedures .Critical Care Performed by: Duffy Bruce, MD Authorized by: Duffy Bruce, MD   Critical care provider statement:    Critical care time (minutes):  75   Critical care time was exclusive of:  Separately billable procedures and treating other patients and teaching time   Critical care was necessary to treat or prevent imminent or life-threatening deterioration of the following conditions:  Cardiac failure, circulatory failure and sepsis   Critical care was time spent personally by me on the following activities:  Development of treatment plan with patient or surrogate, discussions with consultants, evaluation of patient's response to treatment, examination of patient, obtaining history from patient or surrogate, ordering and  performing treatments and interventions, ordering and review of laboratory studies, ordering and review of radiographic studies, pulse oximetry, re-evaluation of patient's condition and review of old charts   I assumed direction of critical care for this patient from another provider in my specialty: no   Procedure Name: Intubation Date/Time: 11/21/2018 6:55 AM Performed by: Duffy Bruce, MD Pre-anesthesia Checklist: Patient identified, Patient being monitored, Emergency Drugs available, Timeout performed and Suction available Oxygen Delivery Method: Non-rebreather mask Preoxygenation: Pre-oxygenation with 100% oxygen Induction Type: Rapid sequence Ventilation: Mask ventilation without difficulty Laryngoscope Size: Glidescope and 4 Grade View: Grade I Number of attempts: 1 Airway Equipment and Method: Rigid stylet and Video-laryngoscopy Placement Confirmation: ETT inserted through vocal cords under direct vision,  CO2 detector and Breath sounds checked- equal and bilateral Secured at: 23 cm Tube secured with: ETT holder Dental Injury: Teeth and Oropharynx as per pre-operative assessment  Difficulty Due To: Difficulty was unanticipated Future Recommendations: Recommend- induction with short-acting agent, and alternative techniques readily available      (including critical care time)  Medications Ordered in ED Medications  sodium chloride flush (NS) 0.9 % injection 3 mL (has no administration in time range)  sodium chloride 0.9 % bolus 1,000 mL (1,000 mLs Intravenous New Bag/Given 11/21/18 0648)  vancomycin (VANCOCIN) 2,000 mg in sodium chloride 0.9 % 500 mL IVPB (2,000 mg Intravenous New Bag/Given 11/21/18 0650)  fentaNYL 252mcg in NS 253mL (7mcg/ml) infusion-PREMIX (75 mcg/hr Intravenous Rate/Dose Change 11/21/18 0649)  fentaNYL (SUBLIMAZE) injection 50 mcg (50 mcg Intravenous Not Given 11/21/18 0623)  fentaNYL 2541mcg in NS 28mL (82mcg/ml) infusion-PREMIX (25 mcg/hr Intravenous Not  Given 11/21/18 0622)  fentaNYL (SUBLIMAZE) bolus via infusion 50 mcg (has no administration in time range)  potassium chloride 10 mEq in 100 mL IVPB (has no administration in time range)  meropenem (MERREM) 1 g in sodium chloride 0.9 % 100 mL  IVPB (has no administration in time range)  vancomycin (VANCOCIN) 1,500 mg in sodium chloride 0.9 % 500 mL IVPB (has no administration in time range)  acetaminophen (TYLENOL) suppository 650 mg (650 mg Rectal Given 11/21/18 0550)  meropenem (MERREM) 1 g in sodium chloride 0.9 % 100 mL IVPB (0 g Intravenous Stopped 11/21/18 0648)  etomidate (AMIDATE) injection 15 mg (15 mg Intravenous Given by Other 11/21/18 5701)  succinylcholine (ANECTINE) injection 142.6 mg (142.6 mg Intravenous Given by Other 11/21/18 0606)  fentaNYL (SUBLIMAZE) injection 50 mcg (50 mcg Intravenous Given by Other 11/21/18 7793)     Initial Impression / Assessment and Plan / ED Course  I have reviewed the triage vital signs and the nursing notes.  Pertinent labs & imaging results that were available during my care of the patient were reviewed by me and considered in my medical decision making (see chart for details).     85 year old male here with fever, respiratory distress.  On arrival, patient tachypneic up to the 50s, altered, febrile, and tachycardic to the 160s.  Code sepsis activated with IVs, fluids, and initial temporization with BiPAP.  Wife arrived and confirmed patient is full code.  He reportedly had been doing well before his previous illnesses over the last several weeks.  Decision subsequently made to intubate, which patient tolerated well.  Chest x-ray is without focal pneumonia.  Suspect patient may have recurrent UTI with sepsis, though influenza is also a consideration.  Given his nausea and vomiting, will also check CT scan as he is unable to provide much history here.  Will admit to the ICU.  Fentanyl drip started.  Broad-spectrum antibiotics including meropenem started. I spent  a significant amount of time counseling pt's wife, daughter on his prognosis, plan of care.  Final Clinical Impressions(s) / ED Diagnoses   Final diagnoses:  Sepsis with acute hypoxic respiratory failure without septic shock, due to unspecified organism [facility])    ED Discharge Orders    None       Duffy Bruce, MD 11/21/18 [id number]

## 2018-11-21 NOTE — ED Notes (Signed)
Pt intubated with 7.5 tube. Color change noted. Placement at 23 lip

## 2018-11-21 NOTE — Consult Note (Signed)
85 year old man who has been in a rehab facility since he had TAVR in 08/2018.  Postop course was complicated by UTI and ESBL Klebsiella bacteremia with vegetation on PPM leads which needed to be extracted, he completed 6 weeks of ertapenem on 10/27/2018.  On a follow-up ID visit he was noted to have some diarrhea but this has since resolved. He was able to ambulate with a walker for a few steps. He developed bilious vomiting in the last 24 hours with febrile illness , was intubated after arrival to the ED due to altered mental status and inability to protect his airway.  He was given 2 L of fluids and PCCM consulted.  Echocardiogram from 10/21/2018 shows EF of 30 to 35% with diffuse hypokinesis, valve appears well-seated  On exam-sedated on fentanyl drip, well built elderly man, in no distress, orally intubated, S1-S2 distant, clear breath sounds bilateral, soft and nontender abdomen, warm extremities.  Labs reviewed which show mild hypokalemia, normal renal function, normal alk phos, mild leukocytosis, lactate of 2.3 and normal hemoglobin. Urinalysis shows WBC clumps and cloudy  Chest x-ray personally reviewed which shows cardiomegaly, ET tube at carina and basilar atelectasis but no clear infiltrates.  CT abdomen/pelvis without contrast was suggestive of acute cholecystitis and stable infrarenal AAA.  Impression/plan Septic shock-source appears to be UTI although acute cholecystitis is a concern, LFTs appear normal at this time.  Based on radiology suggestion, will proceed with limited right upper quadrant ultrasound. Empiric meropenem and vancomycin to cover previous ESBL organism Repeat lactate.  He has received 2 L of fluids already and if he remains hypotensive will initiate pressors and depending on those required will place central line. Rapid influenza test was negative  Acute respiratory failure-ventilator settings were reviewed and adjusted. ET tube will be withdrawn 2 cm.  Ischemic  cardiomyopathy-we will cycle troponins Amiodarone, aspirin and Plavix will be continued. Metoprolol will be held until blood pressure improves  Hypo-kalemia will be repleted. Guarded prognosis was given daughter and wife who desires full CODE STATUS.  Although this appears to be sepsis, have to be alert for other possibilities.  Infrarenal AAA he appears to be stable, wide mediastinum raises possibility of aortic dissection but his symptoms do not corroborate.   The patient is critically ill with multiple organ systems failure and requires high complexity decision making for assessment and support, frequent evaluation and titration of therapies, application of advanced monitoring technologies and extensive interpretation of multiple databases. Critical Care Time devoted to patient care services described in this note independent of APP/resident  time is 55 minutes.   Leanna Sato Elsworth Soho MD

## 2018-11-22 ENCOUNTER — Inpatient Hospital Stay (HOSPITAL_COMMUNITY): Payer: Medicare Other

## 2018-11-22 DIAGNOSIS — R579 Shock, unspecified: Secondary | ICD-10-CM

## 2018-11-22 DIAGNOSIS — L8990 Pressure ulcer of unspecified site, unspecified stage: Secondary | ICD-10-CM

## 2018-11-22 HISTORY — PX: IR PERC CHOLECYSTOSTOMY: IMG2326

## 2018-11-22 LAB — COMPREHENSIVE METABOLIC PANEL
ALT: 32 U/L (ref 0–44)
AST: 36 U/L (ref 15–41)
Albumin: 1.6 g/dL — ABNORMAL LOW (ref 3.5–5.0)
Alkaline Phosphatase: 75 U/L (ref 38–126)
Anion gap: 6 (ref 5–15)
BUN: 24 mg/dL — ABNORMAL HIGH (ref 8–23)
CO2: 20 mmol/L — AB (ref 22–32)
Calcium: 7.7 mg/dL — ABNORMAL LOW (ref 8.9–10.3)
Chloride: 113 mmol/L — ABNORMAL HIGH (ref 98–111)
Creatinine, Ser: 0.89 mg/dL (ref 0.61–1.24)
GFR calc Af Amer: >60 mL/min (ref >60)
GFR calc non Af Amer: >60 mL/min (ref >60)
Glucose, Bld: 181 mg/dL — ABNORMAL HIGH (ref 70–99)
POTASSIUM: 3.4 mmol/L — AB (ref 3.5–5.1)
Sodium: 139 mmol/L (ref 135–145)
Total Bilirubin: 0.9 mg/dL (ref 0.3–1.2)
Total Protein: 5.3 g/dL — ABNORMAL LOW (ref 6.5–8.1)

## 2018-11-22 LAB — URINE CULTURE
Culture: >=100000 — AB
Special Requests: NORMAL

## 2018-11-22 LAB — CBC
HEMATOCRIT: 30.4 % — AB (ref 39.0–52.0)
Hemoglobin: 10 g/dL — ABNORMAL LOW (ref 13.0–17.0)
MCH: 30.8 pg (ref 26.0–34.0)
MCHC: 32.9 g/dL (ref 30.0–36.0)
MCV: 93.5 fL (ref 80.0–100.0)
Platelets: 107 10*3/uL — ABNORMAL LOW (ref 150–400)
RBC: 3.25 MIL/uL — ABNORMAL LOW (ref 4.22–5.81)
RDW: 14.6 % (ref 11.5–15.5)
WBC: 7.7 10*3/uL (ref 4.0–10.5)
nRBC: 0 % (ref 0.0–0.2)

## 2018-11-22 LAB — LEGIONELLA PNEUMOPHILA SEROGP 1 UR AG: L. pneumophila Serogp 1 Ur Ag: NEGATIVE

## 2018-11-22 LAB — PROTIME-INR
INR: 1.39
Prothrombin Time: 16.9 seconds — ABNORMAL HIGH (ref 11.4–15.2)

## 2018-11-22 LAB — RESPIRATORY PANEL BY PCR
Adenovirus: NOT DETECTED
Bordetella pertussis: NOT DETECTED
Chlamydophila pneumoniae: NOT DETECTED
Coronavirus 229E: NOT DETECTED
Coronavirus HKU1: NOT DETECTED
Coronavirus NL63: NOT DETECTED
Coronavirus OC43: NOT DETECTED
Influenza A: NOT DETECTED
Influenza B: NOT DETECTED
Metapneumovirus: NOT DETECTED
Mycoplasma pneumoniae: NOT DETECTED
PARAINFLUENZA VIRUS 1-RVPPCR: NOT DETECTED
Parainfluenza Virus 2: NOT DETECTED
Parainfluenza Virus 3: NOT DETECTED
Parainfluenza Virus 4: NOT DETECTED
RESPIRATORY SYNCYTIAL VIRUS-RVPPCR: NOT DETECTED
Rhinovirus / Enterovirus: NOT DETECTED

## 2018-11-22 LAB — GLUCOSE, CAPILLARY
GLUCOSE-CAPILLARY: 140 mg/dL — AB (ref 70–99)
Glucose-Capillary: 127 mg/dL — ABNORMAL HIGH (ref 70–99)
Glucose-Capillary: 136 mg/dL — ABNORMAL HIGH (ref 70–99)
Glucose-Capillary: 139 mg/dL — ABNORMAL HIGH (ref 70–99)
Glucose-Capillary: 143 mg/dL — ABNORMAL HIGH (ref 70–99)
Glucose-Capillary: 153 mg/dL — ABNORMAL HIGH (ref 70–99)

## 2018-11-22 LAB — AMYLASE: Amylase: 8 U/L — ABNORMAL LOW (ref 28–100)

## 2018-11-22 LAB — MAGNESIUM
MAGNESIUM: 1.9 mg/dL (ref 1.7–2.4)
Magnesium: 2.2 mg/dL (ref 1.7–2.4)

## 2018-11-22 LAB — BASIC METABOLIC PANEL
Anion gap: 8 (ref 5–15)
BUN: 23 mg/dL (ref 8–23)
CO2: 19 mmol/L — ABNORMAL LOW (ref 22–32)
Calcium: 7.6 mg/dL — ABNORMAL LOW (ref 8.9–10.3)
Chloride: 113 mmol/L — ABNORMAL HIGH (ref 98–111)
Creatinine, Ser: 0.89 mg/dL (ref 0.61–1.24)
GFR calc non Af Amer: >60 mL/min (ref >60)
Glucose, Bld: 153 mg/dL — ABNORMAL HIGH (ref 70–99)
POTASSIUM: 3.3 mmol/L — AB (ref 3.5–5.1)
Sodium: 140 mmol/L (ref 135–145)

## 2018-11-22 LAB — PROCALCITONIN: Procalcitonin: 9.53 ng/mL

## 2018-11-22 LAB — PHOSPHORUS
Phosphorus: 2.3 mg/dL — ABNORMAL LOW (ref 2.5–4.6)
Phosphorus: 2.9 mg/dL (ref 2.5–4.6)

## 2018-11-22 LAB — LIPASE, BLOOD: Lipase: 18 U/L (ref 11–51)

## 2018-11-22 MED ORDER — SODIUM CHLORIDE 0.9% FLUSH
5.0000 mL | Freq: Three times a day (TID) | INTRAVENOUS | Status: DC
  Administered 2018-11-22 – 2018-11-27 (×13): 5 mL

## 2018-11-22 MED ORDER — LIDOCAINE HCL 1 % IJ SOLN
INTRAMUSCULAR | Status: AC | PRN
  Administered 2018-11-22: 10 mL

## 2018-11-22 MED ORDER — MIDAZOLAM HCL 2 MG/2ML IJ SOLN
INTRAMUSCULAR | Status: AC
  Filled 2018-11-22: qty 2

## 2018-11-22 MED ORDER — MIDAZOLAM HCL 2 MG/2ML IJ SOLN
INTRAMUSCULAR | Status: AC | PRN
  Administered 2018-11-22 (×2): 1 mg via INTRAVENOUS

## 2018-11-22 MED ORDER — LIDOCAINE HCL 1 % IJ SOLN
INTRAMUSCULAR | Status: AC
  Filled 2018-11-22: qty 20

## 2018-11-22 MED ORDER — POTASSIUM PHOSPHATES 15 MMOLE/5ML IV SOLN
20.0000 mmol | Freq: Once | INTRAVENOUS | Status: AC
  Administered 2018-11-22: 20 mmol via INTRAVENOUS
  Filled 2018-11-22: qty 6.67

## 2018-11-22 MED ORDER — CALCIUM GLUCONATE-NACL 1-0.675 GM/50ML-% IV SOLN
1.0000 g | Freq: Once | INTRAVENOUS | Status: AC
  Administered 2018-11-22: 1000 mg via INTRAVENOUS
  Filled 2018-11-22: qty 50

## 2018-11-22 MED ORDER — IOPAMIDOL (ISOVUE-300) INJECTION 61%
INTRAVENOUS | Status: AC
  Filled 2018-11-22: qty 50

## 2018-11-22 MED ORDER — MAGNESIUM SULFATE IN D5W 1-5 GM/100ML-% IV SOLN
1.0000 g | Freq: Once | INTRAVENOUS | Status: AC
  Administered 2018-11-22: 1 g via INTRAVENOUS
  Filled 2018-11-22: qty 100

## 2018-11-22 NOTE — Progress Notes (Signed)
NAME:  William [name], MRN:  [id number], DOB:  14-Jan-1928, LOS: 1 ADMISSION DATE:  11/21/2018,  REFERRING MD: edp, CHIEF COMPLAINT:  sepsis  Brief History   85 year old male nursing home resident who has extensive past medical history as well documented below who presents with shakes, chills, sweats, vomiting.  Intubated for airway protection and admitted to the ICU Being treated for severe sepsis with gram-negative rod bacteremia from cholecystitis versus urosepsis  Past Medical History  Recent history of bacteremia with Klebsiella ESBL requiring peripheral surgical venous catheter in 6 weeks of anticoagulant therapy Tvar January 2020 Ischemic cardiomyopathy History of AICD placement with removal secondary to bacteremia AAA repair Hypertension Prostate cancer Ventricular tachycardia  Significant Hospital Events   11/21/2018 admitted to the hospital for respiratory failure  Consults:  Surgery, interventional radiology  Procedures:  OETT 2/8 > Right radial A-line 2/8 >  Significant Diagnostic Tests:  CT abdomen pelvis 2/8- cholelithiasis with findings suspicious for acute cholecystitis, stable infrarenal aortic aneurysm, small bilateral effusions with atelectasis  Right upper quadrant ultrasound 2/8-irregular thickened gallbladder wall with multiple gallstones.  Suspicious for acute cholecystitis, right pleural effusion.  Micro Data:  11/21/2018 urine culture 11/21/2018 respiratory culture 11/21/2018 blood cultures x2> Enterobacter colacae 11/21/18 flu a/b>> negative 11/21/18 rvp>> negative  Antimicrobials:  November 21 2018 meropenem>> February 2020 vancomycin>>  Interim history/subjective:  Remains on the ventilator.  Did not need initiation of pressors  Objective   Blood pressure 93/63, pulse 79, temperature 99.1 F (37.3 C), temperature source Axillary, resp. rate 18, height 5\' 11"  (1.803 m), weight 95.3 kg, SpO2 99 %.    Vent Mode: PRVC FiO2 (%):  [40 %-50 %] 40 % Set  Rate:  [18 bmp] 18 bmp Vt Set:  [600 [id number] mL] 600 mL PEEP:  [5 cmH20] 5 cmH20 Plateau Pressure:  [15 cmH20-20 cmH20] 18 cmH20   Intake/Output Summary (Last 24 hours) at 11/22/2018 1223 Last data filed at 11/22/2018 0800 Gross per 24 hour  Intake 2918 ml  Output 310 ml  Net 2608 ml   Filed Weights   11/21/18 0512 11/22/18 0500  Weight: 95 kg 95.3 kg    Examination: Gen:      Frail, elderly man HEENT:  EOMI, sclera anicteric, ET tube Neck:     No masses; no thyromegaly Lungs:    Clear to auscultation bilaterally; normal respiratory effort CV:         Regular rate and rhythm; no murmurs Abd:      + bowel sounds; soft, non-tender; no palpable masses, no distension Ext:   1+ edema; adequate peripheral perfusion Skin:      Warm and dry; no rash Neuro: Sedated on the vent  Resolved Hospital Problem list     Assessment & Plan:  Vent dependent respiratory failure secondary to sepsis with a history of sleep apnea cleared with weight loss of 60 pounds Continue vent support Follow chest x-ray  Severe sepsis, Enterobacter bacteremia secondary to acute cholecystitis versus UTI Recent history of ESBL Klebsiella bacteremia require PICC line and IV antimicrobial therapy.   Follow final sensitivities Continue Vanco, meropenem Discussed with Dr. Donne Hazel from surgery.  May need percutaneous biliary drainage IR consulted.  Extensive cardiac history and includes TAVR January 2020, AICD which was removed due to bacteremia.  History of ventricular tachycardia required shock when AICD was in place.  Severe valvular stenosis.  Nonischemic cardiomyopathy  Follow echocardiogram, trend troponins. May consider cardiac consult near future  Hypokalemia Replete potassium  Hyperglycemia Sliding  scale insulin protocol  Best practice:  Diet: N.p.o. Pain/Anxiety/Delirium protocol (if indicated): Fentanyl VAP protocol (if indicated): In place DVT prophylaxis: Heparin GI prophylaxis:  Protonix Glucose control: Sliding scale insulin Mobility: None bedrest Code Status: Full code Family Communication: Daughters updated at bedside 2/9 Disposition: ICU  Labs   CBC: Recent Labs  Lab 11/21/18 0521 11/21/18 0655 11/21/18 0851 11/21/18 1035 11/22/18 0329  WBC 14.1*  --  13.7*  --  7.7  NEUTROABS 12.6*  --   --   --   --   HGB 12.3* 12.9* 11.4* 10.2* 10.0*  HCT 36.3* 38.0* 33.5* 30.0* 30.4*  MCV 93.3  --  93.1  --  93.5  PLT 163  --  127*  --  107*    Basic Metabolic Panel: Recent Labs  Lab 11/21/18 0521 11/21/18 0655 11/21/18 0851 11/21/18 1035 11/21/18 1350 11/22/18 0329  NA 135 136 138 138  --  140  K 3.3* 3.3* 3.1* 3.4*  --  3.3*  CL 103  --  107  --   --  113*  CO2 23  --  18*  --   --  19*  GLUCOSE 241*  --  188*  --   --  153*  BUN 20  --  20  --   --  23  CREATININE 1.04  --  1.09  --   --  0.89  CALCIUM 8.0*  --  7.8*  --   --  7.6*  MG  --   --  1.8  --  1.8 1.9  PHOS  --   --  1.9*  --  2.3* 2.3*   GFR: Estimated Creatinine Clearance: 66.3 mL/min (by C-G formula based on SCr of 0.89 mg/dL). Recent Labs  Lab 11/21/18 0521 11/21/18 0522 11/21/18 0851 11/21/18 1122 11/22/18 0329  PROCALCITON  --   --  3.53  --  9.53  WBC 14.1*  --  13.7*  --  7.7  LATICACIDVEN  --  2.3*  --  2.0*  --     Liver Function Tests: Recent Labs  Lab 11/21/18 0521 11/21/18 0851  AST 23 25  ALT 21 21  ALKPHOS 91 87  BILITOT 1.3* 1.4*  PROT 6.5 5.8*  ALBUMIN 2.2* 2.0*   Recent Labs  Lab 11/21/18 0851  LIPASE 20  AMYLASE 10*   No results for input(s): AMMONIA in the last 168 hours.  ABG    Component Value Date/Time   PHART 7.473 (H) 11/21/2018 1035   PCO2ART 28.3 (L) 11/21/2018 1035   PO2ART 187.0 (H) 11/21/2018 1035   HCO3 20.7 11/21/2018 1035   TCO2 22 11/21/2018 1035   ACIDBASEDEF 2.0 11/21/2018 1035   O2SAT 100.0 11/21/2018 1035     Coagulation Profile: Recent Labs  Lab 11/21/18 0521 11/21/18 0851  INR 1.20 1.33    Cardiac  Enzymes: Recent Labs  Lab 11/21/18 0851 11/21/18 1350 11/21/18 1939  TROPONINI 0.06* 0.05* 0.04*    HbA1C: Hgb A1c MFr Bld  Date/Time Value Ref Range Status  08/13/2018 04:09 AM 5.2 4.8 - 5.6 % Final    Comment:    (NOTE) Pre diabetes:          5.7%-6.4% Diabetes:              >6.4% Glycemic control for   <7.0% adults with diabetes     CBG: Recent Labs  Lab 11/21/18 1650 11/21/18 2008 11/21/18 2341 11/22/18 0326 11/22/18 0832  GLUCAP 133* 126* 127* 140*  143*   The patient is critically ill with multiple organ system failure and requires high complexity decision making for assessment and support, frequent evaluation and titration of therapies, advanced monitoring, review of radiographic studies and interpretation of complex data.   Critical Care Time devoted to patient care services, exclusive of separately billable procedures, described in this note is 35 minutes.   Marshell Garfinkel MD [facility] Pager [phone] If no answer call [phone] 11/22/2018, 12:31 PM

## 2018-11-22 NOTE — Consult Note (Signed)
Chief Complaint: sepsis  Referring Physician(s): Dr. Vaughan Browner  Supervising Physician: Corrie Mckusick  Patient Status: [facility] - In-pt  History of Present Illness: William [name] is a 85 y.o. male presenting to [place] with sepsis from acute cholecystitis.   He is unfit now for general surgery for cholecystectomy, and drainage is indicated.   Past Medical History:  Diagnosis Date  . AAA (abdominal aortic aneurysm) ([place])    7/13 3.8cm  . Arthritis    "joints" (01/11/2014)  . Asthma    "seasonal; some foods"   . Chronic systolic CHF (congestive heart failure) ([place])   . Dementia ([place])   . HLD (hyperlipidemia)   . HOH (hard of hearing)   . Lumbar vertebral fracture ([facility])    L1- 04/11/2014   . Prostate cancer ([place])   . S/P ICD (internal cardiac defibrillator) procedure, 01/11/14 removal of ERI gen and placement of Medtronic Evera XT VR & NEW Right ventricular lead Medtronic 01/12/2014  . S/P TAVR (transcatheter aortic valve replacement)    Edwards Sapien 3 THV (size 29 mm, model # [id number], serial # [id number])  . Severe aortic stenosis   . Sleep apnea    "lost 60# & don't have it anymore" (01/11/2014)  . Ventricular tachycardia [facility])     Past Surgical History:  Procedure Laterality Date  . CARDIAC CATHETERIZATION  08/20/2004   noncritical CAD,mild global hypokinesis, EF 50%  . CARDIAC DEFIBRILLATOR PLACEMENT  08/23/2004   Medtronic  . CATARACT EXTRACTION W/ INTRAOCULAR LENS IMPLANT Right   . COLECTOMY  1990's  . CRYOABLATION N/A 02/24/2014   Procedure: CRYO ABLATION PROSTATE;  Surgeon: Ailene Rud, MD;  Location: [facility] ORS;  Service: Urology;  Laterality: N/A;  . HERNIA REPAIR     "abdomen; from colon OR"  . ICD LEAD REMOVAL N/A 09/15/2018   Procedure: ICD LEAD REMOVAL EXTRACTION;  Surgeon: Evans Lance, MD;  Location: [facility] OR;  Service: Cardiovascular;  Laterality: N/A;  DR. BARTLE TO BACK UP  . IMPLANTABLE CARDIOVERTER DEFIBRILLATOR (ICD) GENERATOR CHANGE N/A 01/11/2014     Procedure: ICD GENERATOR CHANGE;  Surgeon: Sanda Klein, MD;  Location: [place] CATH LAB;  Service: Cardiovascular;  Laterality: N/A;  . LEAD REVISION N/A 01/11/2014   Procedure: LEAD REVISION;  Surgeon: Sanda Klein, MD;  Location: [place] CATH LAB;  Service: Cardiovascular;  Laterality: N/A;  . NM MYOCAR PERF WALL MOTION  01/29/2012   abnormal c/o infarct/scar,no ischemia present  . PROSTATE BIOPSY N/A 11/22/2013   Procedure: PROSTATE BIOPSY AND ULTRASOUND;  Surgeon: Ailene Rud, MD;  Location: [facility]S;  Service: Urology;  Laterality: N/A;  . RIGHT/LEFT HEART CATH AND CORONARY ANGIOGRAPHY N/A 07/06/2018   Procedure: RIGHT/LEFT HEART CATH AND CORONARY ANGIOGRAPHY;  Surgeon: Troy Sine, MD;  Location: [place] CV LAB;  Service: Cardiovascular;  Laterality: N/A;  . TEE WITHOUT CARDIOVERSION N/A 08/18/2018   Procedure: TRANSESOPHAGEAL ECHOCARDIOGRAM (TEE);  Surgeon: Burnell Blanks, MD;  Location: [place];  Service: Open Heart Surgery;  Laterality: N/A;  . TEE WITHOUT CARDIOVERSION N/A 09/08/2018   Procedure: TRANSESOPHAGEAL ECHOCARDIOGRAM (TEE);  Surgeon: Lelon Perla, MD;  Location: [facility] ENDOSCOPY;  Service: Cardiovascular;  Laterality: N/A;  . TEE WITHOUT CARDIOVERSION N/A 09/15/2018   Procedure: TRANSESOPHAGEAL ECHOCARDIOGRAM (TEE);  Surgeon: Evans Lance, MD;  Location: [place];  Service: Cardiovascular;  Laterality: N/A;  . TRANSCATHETER AORTIC VALVE REPLACEMENT, TRANSFEMORAL  08/18/2018  . TRANSCATHETER AORTIC VALVE REPLACEMENT, TRANSFEMORAL N/A 08/18/2018   Procedure: TRANSCATHETER AORTIC VALVE REPLACEMENT, TRANSFEMORAL using  an Edwards 1mm Aortic Valve;  Surgeon: Burnell Blanks, MD;  Location: [place];  Service: Open Heart Surgery;  Laterality: N/A;    Allergies: Crestor [rosuvastatin]; Lipitor [atorvastatin]; and Shrimp [shellfish allergy]  Medications: Prior to Admission medications   Medication Sig Start Date End Date Taking? Authorizing Provider   acetaminophen (TYLENOL) 500 MG tablet Take 500 mg by mouth every 6 (six) hours as needed (for pain).    Yes [provider]  Amino Acids-Protein Hydrolys (FEEDING SUPPLEMENT, PRO-STAT SUGAR FREE 64,) LIQD Take 30 mLs by mouth daily.   Yes [provider]  amiodarone (PACERONE) 200 MG tablet Take 0.5 tablets (100 mg total) by mouth daily. 11/16/18  Yes Croitoru, Mihai, MD  aspirin 81 MG chewable tablet Chew 1 tablet (81 mg total) by mouth daily. 07/08/18  Yes Kroeger, Lorelee Cover., PA-C  cholecalciferol (VITAMIN D) 1000 units tablet Take 2,000 Units by mouth at bedtime.   Yes [provider]  clopidogrel (PLAVIX) 75 MG tablet Take 1 tablet (75 mg total) by mouth daily with breakfast. 08/21/18  Yes Eileen Stanford, PA-C  ezetimibe-simvastatin (VYTORIN) 10-20 MG per tablet Take 1 tablet by mouth at bedtime.   Yes [provider]  metoprolol succinate (TOPROL-XL) 25 MG 24 hr tablet Take 1 tablet (25 mg total) by mouth daily. Take with or immediately following a meal. 09/18/18  Yes Nita Sells, MD  Multiple Vitamins-Minerals (PRESERVISION AREDS 2 PO) Take 1 capsule by mouth daily.    Yes [provider]  polyethylene glycol (MIRALAX / GLYCOLAX) packet Take 17 g by mouth every evening. Patient taking differently: Take 1 g by mouth every evening. 1 teaspoon daily 01/21/17  Yes Croitoru, Mihai, MD  triamcinolone (KENALOG) 0.025 % cream Apply 1 application topically 2 (two) times daily. Until resolved   Yes [provider]     Family History  Problem Relation Age of Onset  . Heart failure Mother        Died of "old age" at 70  . Pneumonia Father     Social History   Socioeconomic History  . Marital status: Married    Spouse name: Not on file  . Number of children: 2  . Years of education: Not on file  . Highest education level: Not on file  Occupational History  . Occupation: Owned a Scientist, [employer]  Social Needs  . Financial  resource strain: Not on file  . Food insecurity:    Worry: Not on file    Inability: Not on file  . Transportation needs:    Medical: Not on file    Non-medical: Not on file  Tobacco Use  . Smoking status: Never Smoker  . Smokeless tobacco: Never Used  Substance and Sexual Activity  . Alcohol use: Yes    Alcohol/week: 0.0 standard drinks    Comment: 01/11/2014 "drink 1/2 of a beer twice per month   . Drug use: No  . Sexual activity: Not Currently  Lifestyle  . Physical activity:    Days per week: Not on file    Minutes per session: Not on file  . Stress: Not on file  Relationships  . Social connections:    Talks on phone: Not on file    Gets together: Not on file    Attends religious service: Not on file    Active member of club or organization: Not on file    Attends meetings of clubs or organizations: Not on file    Relationship status:  Not on file  Other Topics Concern  . Not on file  Social History Narrative  . Not on file       Review of Systems: A 12 point ROS discussed and pertinent positives are indicated in the HPI above.  All other systems are negative.  Review of Systems  Vital Signs: BP 99/65   Pulse 91   Temp 100.2 F (37.9 C)   Resp 18   Ht 5\' 11"  (1.803 m)   Wt 95.3 kg   SpO2 100%   BMI 29.30 kg/m   Physical Exam General: 85 yo male appearing  stated age.  Unresponsive in ICU bed.  HEENT: Atraumatic, normocephalic.   No scleral icterus or scleral injection. Intubated. Neck: Symmetric with no goiter enlargement.  Chest/Lungs:  Symmetric chest with inspiration/expiration on vent.    Heart:  Tachycardiac  Abdomen:  TTP of RUQ   Genito-urinary: Deferred Neurologic: unresponsive on vent.     Imaging: Ct Abdomen Pelvis Wo Contrast  Result Date: 11/21/2018 CLINICAL DATA:  Sepsis. EXAM: CT ABDOMEN AND PELVIS WITHOUT CONTRAST TECHNIQUE: Multidetector CT imaging of the abdomen and pelvis was performed following the standard protocol without IV  contrast. COMPARISON:  CT scan 09/04/2018 FINDINGS: Lower chest: The heart is upper limits normal in size for age. Coronary artery calcifications and evidence of prior TAVR. Small bilateral pleural effusions and bibasilar atelectasis or infiltrates. Hepatobiliary: No focal hepatic lesions are identified without contrast. Numerous gallstones are noted in the gallbladder. Suspect mild gallbladder wall thickening and mild pericholecystic inflammatory changes. Could not exclude acute cholecystitis. No common bile duct dilatation. Pancreas: Moderate atrophy but no mass or acute inflammation. Spleen: Normal size.  No focal lesions. Adrenals/Urinary Tract: The adrenal glands are unremarkable and stable. Bilateral renal cysts but no worrisome renal masses. No renal or obstructing ureteral calculi. There is a Foley catheter in a decompressed bladder. No bladder mass or bladder calculi. Stomach/Bowel: The stomach contains an NG tube. The duodenum, small bowel and colon are grossly normal. No acute inflammatory process, mass lesion or obstructive findings. Evidence of a prior right hemicolectomy. Sigmoid colon diverticulosis without findings for acute diverticulitis. Small amount of fluid noted in the right pericolic gutter and in the anterior pararenal space. Vascular/Lymphatic: Advanced atherosclerotic calcifications involving the aorta. 4.6 x 4.0 cm infrarenal abdominal aortic aneurysm with a left-sided saccular component. This measured a maximum of 4.5 cm on the prior study. Reproductive: The prostate gland and seminal vesicles are grossly normal. Other: Small amount of free fluid in the pelvis but no pelvic mass or adenopathy. No inguinal mass or adenopathy. Musculoskeletal: No significant bony findings. Stable compression fracture of L1 with a vertebral plana appearance. IMPRESSION: 1. Cholelithiasis with findings suspicious for acute cholecystitis. Recommend correlation with right upper quadrant ultrasound examination.  2. Stable infrarenal abdominal aortic aneurysm with a left-sided saccular component. 3. Small bilateral pleural effusions and bibasilar atelectasis or infiltrates. Electronically Signed   By: Marijo Sanes M.D.   On: 11/21/2018 08:57   US Abdomen Complete  Result Date: 11/21/2018 CLINICAL DATA:  Acute cholecystitis an urinary tract infection. Patient is intubated. Elevated total bilirubin. EXAM: ABDOMEN ULTRASOUND COMPLETE COMPARISON:  CT on 11/21/2018 a prior CT exams including 12/07/2013 FINDINGS: Gallbladder: Gallbladder wall is irregular and thickened, measuring 7 millimeters. Dependent stones are present, measuring on the order of 1.7 centimeters or smaller. Common bile duct: Diameter: 5.4 millimeter Liver: The visualized portion of the liver is unremarkable. LEFT hepatic lobe is not well seen because of  bowel gas. Portal vein is patent on color Doppler imaging with normal direction of blood flow towards the liver. IVC: Visualized portion normal. Pancreas: Obscured by bowel gas. Spleen: Obscured by bowel gas. Right Kidney: Length: 11.0 centimeters. 4.6 x 4.5 x 5.2 centimeter cyst containing internal septation. On CT exam 2015, this lesion measured 3.2 x 3.2 centimeters. No hydronephrosis. Left Kidney: Length: 12.3 centimeters. Simple cyst is 3.5 x 2.4 x 2.9 centimeters. No hydronephrosis. Abdominal aorta: Not visualized on the level of the mid abdominal aorta to the bifurcation segment 1 bowel gas. No visualized aneurysm. Other findings: Study quality is degraded by patient body habitus and intubation. Note is made of a small RIGHT pleural effusion. IMPRESSION: 1. Irregular, thickened gallbladder wall and multiple gallstones. Presence or absence of sonographic Murphy's can not be assessed due to the patient's intubated status. Findings are suspicious for acute cholecystitis however. 2. RIGHT pleural effusion. 3. Complex cystic lesion within the UPPER pole the RIGHT kidney. Lesion has progressed since prior  studies. If possible and needed, consider further characterization with outpatient contrast-enhanced CT or MRI with intravenous contrast. Electronically Signed   By: Nolon Nations M.D.   On: 11/21/2018 12:46   Dg Chest Portable 1 View  Result Date: 11/21/2018 CLINICAL DATA:  Intubation. EXAM: PORTABLE CHEST 1 VIEW COMPARISON:  Earlier film, same date. FINDINGS: The endotracheal tube is right at the carina and should be retracted approximately 3 cm. The NG tube is coursing down the esophagus and into the stomach. Stable cardiac enlargement. Bibasilar atelectasis but no definite infiltrates or effusions. IMPRESSION: The endotracheal tube is right at the carina and should be retracted at least 3 cm. Bibasilar atelectasis but no edema or infiltrates Electronically Signed   By: Marijo Sanes M.D.   On: 11/21/2018 07:40   Dg Chest Port 1 View  Result Date: 11/21/2018 CLINICAL DATA:  Sepsis. EXAM: PORTABLE CHEST 1 VIEW COMPARISON:  09/16/2018 FINDINGS: The heart is enlarged but stable. Marked tortuosity and calcification of the thoracic aorta. Bibasilar atelectasis. No definite pleural effusions. No obvious infiltrates. IMPRESSION: Cardiac enlargement and marked tortuosity and calcification of the thoracic aorta. No definite infiltrates, effusions or edema. Streaky basilar atelectasis. Electronically Signed   By: Marijo Sanes M.D.   On: 11/21/2018 06:21    Labs:  CBC: Recent Labs    10/28/18 1629 11/21/18 0521 11/21/18 0655 11/21/18 0851 11/21/18 1035 11/22/18 0329  WBC 6.3 14.1*  --  13.7*  --  7.7  HGB 11.1* 12.3* 12.9* 11.4* 10.2* 10.0*  HCT 32.8* 36.3* 38.0* 33.5* 30.0* 30.4*  PLT 131* 163  --  127*  --  107*    COAGS: Recent Labs    08/07/18 1411 08/13/18 0409 11/21/18 0521 11/21/18 0851 11/22/18 1229  INR 1.25 1.09 1.20 1.33 1.39  APTT 34 37*  --  34  --     BMP: Recent Labs    11/21/18 0521  11/21/18 0851 11/21/18 1035 11/22/18 0329 11/22/18 1229  NA 135   < > 138  138 140 139  K 3.3*   < > 3.1* 3.4* 3.3* 3.4*  CL 103  --  107  --  113* 113*  CO2 23  --  18*  --  19* 20*  GLUCOSE 241*  --  188*  --  153* 181*  BUN 20  --  20  --  23 24*  CALCIUM 8.0*  --  7.8*  --  7.6* 7.7*  CREATININE 1.04  --  1.09  --  0.89 0.89  GFRNONAA >60  --  60*  --  >60 >60  GFRAA >60  --  >60  --  >60 >60   < > = values in this interval not displayed.    LIVER FUNCTION TESTS: Recent Labs    09/09/18 0826 11/21/18 0521 11/21/18 0851 11/22/18 1229  BILITOT 1.4* 1.3* 1.4* 0.9  AST 26 23 25  36  ALT 42 21 21 32  ALKPHOS 155* 91 87 75  PROT 5.6* 6.5 5.8* 5.3*  ALBUMIN 2.5* 2.2* 2.0* 1.6*    TUMOR MARKERS: No results for input(s): AFPTM, CEA, CA199, CHROMGRNA in the last 8760 hours.  Assessment and Plan:  85 yo male with sepsis from acute cholecystitis, with high morbidity/mortality for surgery.   Plan to proceed with image guided perc chole.   Risks and benefits discussed with the patient's family including bleeding, infection, damage to adjacent structures, bowel perforation/fistula connection, and sepsis.  All of the patient's questions were answered, patient is agreeable to proceed. Consent signed and in chart.  Thank you for this interesting consult.  I greatly enjoyed meeting ZACKARY MCKEONE and look forward to participating in their care.  A copy of this report was sent to the requesting provider on this date.  Electronically Signed: Corrie Mckusick, DO 11/22/2018, 2:50 PM   I spent a total of 40 Minutes    in face to face in clinical consultation, greater than 50% of which was counseling/coordinating care for acute cholecystitis, percutaneous cholecystostomy.

## 2018-11-22 NOTE — Progress Notes (Signed)
eLink Physician-Brief Progress Note Patient Name: William [name] DOB: [birth year] MRN: [id number]   Date of Service  11/22/2018  HPI/Events of Note  K+ = 3.3, Ca++ = 7.6, PO4--- = 2.3, Mg++ = 1.9 and Creatinine = 0.89.   eICU Interventions  Replace K+, Mg++, PO4--- and Ca++.     Intervention Category Major Interventions: Electrolyte abnormality - evaluation and management  Jayne Peckenpaugh [place] 11/22/2018, 6:19 AM

## 2018-11-22 NOTE — Consult Note (Signed)
Reason for Consult:sepsis, possible cholecystitis Referring Physician: Dr Arlie Solomons Santoyo is an 85 y.o. male.  HPI: 59 yom intubated sedated who began having n/v recently. Never had abd pain per family. No history ruq pain they are aware of. He had ct and [place] with possible cholecystitis.  I was asked to see him for this as source of sepsis. Has history of colectomy for what family describes as colonic perforation in distant past. He has multiple medical issues,  Past Medical History:  Diagnosis Date  . AAA (abdominal aortic aneurysm) ([place])    7/13 3.8cm  . Arthritis    "joints" (01/11/2014)  . Asthma    "seasonal; some foods"   . Chronic systolic CHF (congestive heart failure) ([place])   . Dementia ([place])   . HLD (hyperlipidemia)   . HOH (hard of hearing)   . Lumbar vertebral fracture ([facility])    L1- 04/11/2014   . Prostate cancer ([place])   . S/P ICD (internal cardiac defibrillator) procedure, 01/11/14 removal of ERI gen and placement of Medtronic Evera XT VR & NEW Right ventricular lead Medtronic 01/12/2014  . S/P TAVR (transcatheter aortic valve replacement)    Edwards Sapien 3 THV (size 29 mm, model # [id number], serial # [id number])  . Severe aortic stenosis   . Sleep apnea    "lost 60# & don't have it anymore" (01/11/2014)  . Ventricular tachycardia [facility])     Past Surgical History:  Procedure Laterality Date  . CARDIAC CATHETERIZATION  08/20/2004   noncritical CAD,mild global hypokinesis, EF 50%  . CARDIAC DEFIBRILLATOR PLACEMENT  08/23/2004   Medtronic  . CATARACT EXTRACTION W/ INTRAOCULAR LENS IMPLANT Right   . COLECTOMY  1990's  . CRYOABLATION N/A 02/24/2014   Procedure: CRYO ABLATION PROSTATE;  Surgeon: Ailene Rud, MD;  Location: [facility];  Service: Urology;  Laterality: N/A;  . HERNIA REPAIR     "abdomen; from colon OR"  . ICD LEAD REMOVAL N/A 09/15/2018   Procedure: ICD LEAD REMOVAL EXTRACTION;  Surgeon: Evans Lance, MD;  Location: [facility] OR;  Service:  Cardiovascular;  Laterality: N/A;  DR. BARTLE TO BACK UP  . IMPLANTABLE CARDIOVERTER DEFIBRILLATOR (ICD) GENERATOR CHANGE N/A 01/11/2014   Procedure: ICD GENERATOR CHANGE;  Surgeon: Sanda Klein, MD;  Location: [facility] CATH LAB;  Service: Cardiovascular;  Laterality: N/A;  . LEAD REVISION N/A 01/11/2014   Procedure: LEAD REVISION;  Surgeon: Sanda Klein, MD;  Location: [facility] CATH LAB;  Service: Cardiovascular;  Laterality: N/A;  . NM MYOCAR PERF WALL MOTION  01/29/2012   abnormal c/o infarct/scar,no ischemia present  . PROSTATE BIOPSY N/A 11/22/2013   Procedure: PROSTATE BIOPSY AND ULTRASOUND;  Surgeon: Ailene Rud, MD;  [facility];  Service: Urology;  Laterality: N/A;  . RIGHT/LEFT HEART CATH AND CORONARY ANGIOGRAPHY N/A 07/06/2018   Procedure: RIGHT/LEFT HEART CATH AND CORONARY ANGIOGRAPHY;  Surgeon: Troy Sine, MD;  Location: [facility] CV LAB;  Service: Cardiovascular;  Laterality: N/A;  . TEE WITHOUT CARDIOVERSION N/A 08/18/2018   Procedure: TRANSESOPHAGEAL ECHOCARDIOGRAM (TEE);  Surgeon: Burnell Blanks, MD;  Location: [place];  Service: Open Heart Surgery;  Laterality: N/A;  . TEE WITHOUT CARDIOVERSION N/A 09/08/2018   Procedure: TRANSESOPHAGEAL ECHOCARDIOGRAM (TEE);  Surgeon: Lelon Perla, MD;  Location: [facility];  Service: Cardiovascular;  Laterality: N/A;  . TEE WITHOUT CARDIOVERSION N/A 09/15/2018   Procedure: TRANSESOPHAGEAL ECHOCARDIOGRAM (TEE);  Surgeon: Evans Lance, MD;  Location: [facility];  Service: Cardiovascular;  Laterality: N/A;  .  TRANSCATHETER AORTIC VALVE REPLACEMENT, TRANSFEMORAL  08/18/2018  . TRANSCATHETER AORTIC VALVE REPLACEMENT, TRANSFEMORAL N/A 08/18/2018   Procedure: TRANSCATHETER AORTIC VALVE REPLACEMENT, TRANSFEMORAL using an Edwards 50mm Aortic Valve;  Surgeon: Burnell Blanks, MD;  Location: [place] [facility];  Service: Open Heart Surgery;  Laterality: N/A;    Family History  Problem Relation Age of Onset  . Heart failure Mother         Died of "old age" at 26  . Pneumonia Father     Social History:  reports that he has never smoked. He has never used smokeless tobacco. He reports current alcohol use. He reports that he does not use drugs.  Allergies:  Allergies  Allergen Reactions  . Crestor [Rosuvastatin] Other (See Comments)    Hurts muscles  . Lipitor [Atorvastatin] Other (See Comments)    Hurts stomach  . Shrimp [Shellfish Allergy] Other (See Comments)    On MAR    Medications: I have reviewed the patient's current medications.  Results for orders placed or performed during the hospital encounter of 11/21/18 (from the past 48 hour(s))  Comprehensive metabolic panel     Status: Abnormal   Collection Time: 11/21/18  5:21 AM  Result Value Ref Range   Sodium 135 135 - 145 mmol/L   Potassium 3.3 (L) 3.5 - 5.1 mmol/L   Chloride 103 98 - 111 mmol/L   CO2 23 22 - 32 mmol/L   Glucose, Bld 241 (H) 70 - 99 mg/dL   BUN 20 8 - 23 mg/dL   Creatinine, Ser 1.04 0.61 - 1.24 mg/dL   Calcium 8.0 (L) 8.9 - 10.3 mg/dL   Total Protein 6.5 6.5 - 8.1 g/dL   Albumin 2.2 (L) 3.5 - 5.0 g/dL   AST 23 15 - 41 U/L   ALT 21 0 - 44 U/L   Alkaline Phosphatase 91 38 - 126 U/L   Total Bilirubin 1.3 (H) 0.3 - 1.2 mg/dL   GFR calc non Af Amer >60 >60 mL/min   GFR calc Af Amer >60 >60 mL/min   Anion gap 9 5 - 15    Comment: Performed at [facility], [address], [place], [place] [address]  CBC with Differential     Status: Abnormal   Collection Time: 11/21/18  5:21 AM  Result Value Ref Range   WBC 14.1 (H) 4.0 - 10.5 K/uL   RBC 3.89 (L) 4.22 - 5.81 MIL/uL   Hemoglobin 12.3 (L) 13.0 - 17.0 g/dL   HCT 36.3 (L) 39.0 - 52.0 %   MCV 93.3 80.0 - 100.0 fL   MCH 31.6 26.0 - 34.0 pg   MCHC 33.9 30.0 - 36.0 g/dL   RDW 14.6 11.5 - 15.5 %   Platelets 163 150 - 400 K/uL   nRBC 0.0 0.0 - 0.2 %   Neutrophils Relative % 89 %   Neutro Abs 12.6 (H) 1.7 - 7.7 K/uL   Lymphocytes Relative 3 %   Lymphs Abs 0.5 (L) 0.7 - 4.0 K/uL    Monocytes Relative 7 %   Monocytes Absolute 0.9 0.1 - 1.0 K/uL   Eosinophils Relative 0 %   Eosinophils Absolute 0.0 0.0 - 0.5 K/uL   Basophils Relative 0 %   Basophils Absolute 0.0 0.0 - 0.1 K/uL   Immature Granulocytes 1 %   Abs Immature Granulocytes 0.08 (H) 0.00 - 0.07 K/uL    Comment: Performed at [facility] [address], [place], [place] [address]  Protime-INR  Status: None   Collection Time: 11/21/18  5:21 AM  Result Value Ref Range   Prothrombin Time 15.1 11.4 - 15.2 seconds   INR 1.20     Comment: Performed at [facility] [address] [place], [place]  [address]  Culture, blood (Routine x 2)     Status: None (Preliminary result)   Collection Time: 11/21/18  5:21 AM  Result Value Ref Range   Specimen Description BLOOD RIGHT ANTECUBITAL    Special Requests      BOTTLES DRAWN AEROBIC AND ANAEROBIC Blood Culture adequate volume   Culture  Setup Time      GRAM NEGATIVE RODS IN BOTH AEROBIC AND ANAEROBIC BOTTLES CRITICAL RESULT CALLED TO, READ BACK BY AND VERIFIED WITH: B MANCHERIL,PHARMD AT 2014 11/21/2018 BY L BENFIELD Performed at [facility], [place] [address] [place], [place] [address]    Culture GRAM NEGATIVE RODS    Report Status PENDING   Blood Culture ID Panel (Reflexed)     Status: Abnormal   Collection Time: 11/21/18  5:21 AM  Result Value Ref Range   Enterococcus species NOT DETECTED NOT DETECTED   Listeria monocytogenes NOT DETECTED NOT DETECTED   Staphylococcus species NOT DETECTED NOT DETECTED   Staphylococcus aureus (BCID) NOT DETECTED NOT DETECTED   Streptococcus species NOT DETECTED NOT DETECTED   Streptococcus agalactiae NOT DETECTED NOT DETECTED   Streptococcus pneumoniae NOT DETECTED NOT DETECTED   Streptococcus pyogenes NOT DETECTED NOT DETECTED   Acinetobacter baumannii NOT DETECTED NOT DETECTED   Enterobacteriaceae species DETECTED (A) NOT DETECTED    Comment: Enterobacteriaceae represent a large family of gram-negative  bacteria, not a single organism. CRITICAL RESULT CALLED TO, READ BACK BY AND VERIFIED WITH: B MANCHERIL,PHARMD AT 2014 11/21/2018 BY L BENFIELD    Enterobacter cloacae complex DETECTED (A) NOT DETECTED    Comment: CRITICAL RESULT CALLED TO, READ BACK BY AND VERIFIED WITH: B MANCHERIL,PHARMD AT 2014 11/21/2018 BY L BENFIELD    Escherichia coli NOT DETECTED NOT DETECTED   Klebsiella oxytoca NOT DETECTED NOT DETECTED   Klebsiella pneumoniae NOT DETECTED NOT DETECTED   Proteus species NOT DETECTED NOT DETECTED   Serratia marcescens NOT DETECTED NOT DETECTED   Carbapenem resistance NOT DETECTED NOT DETECTED   Haemophilus influenzae NOT DETECTED NOT DETECTED   Neisseria meningitidis NOT DETECTED NOT DETECTED   Pseudomonas aeruginosa NOT DETECTED NOT DETECTED   Candida albicans NOT DETECTED NOT DETECTED   Candida glabrata NOT DETECTED NOT DETECTED   Candida krusei NOT DETECTED NOT DETECTED   Candida parapsilosis NOT DETECTED NOT DETECTED   Candida tropicalis NOT DETECTED NOT DETECTED    Comment: Performed at [facility] [address], [place], [place] [address]  Lactic acid, plasma     Status: Abnormal   Collection Time: 11/21/18  5:22 AM  Result Value Ref Range   Lactic Acid, Venous 2.3 (HH) 0.5 - 1.9 mmol/L    Comment: CRITICAL RESULT CALLED TO, READ BACK BY AND VERIFIED WITH: GRIDSTAFF,S RN 11/21/2018 0602 JORDANS Performed at [facility], [place] [address], [place], [place] [address]   Culture, blood (Routine x 2)     Status: None (Preliminary result)   Collection Time: 11/21/18  5:22 AM  Result Value Ref Range   Specimen Description BLOOD BLOOD LEFT FOREARM    Special Requests      BOTTLES DRAWN AEROBIC AND ANAEROBIC Blood Culture adequate volume   Culture  Setup Time      GRAM  NEGATIVE RODS IN BOTH AEROBIC AND ANAEROBIC BOTTLES CRITICAL VALUE NOTED.  VALUE IS CONSISTENT WITH PREVIOUSLY REPORTED AND CALLED VALUE. Performed at [facility], [place] [address]  [place], [place] [address]    Culture GRAM NEGATIVE RODS    Report Status PENDING   I-STAT 7, (LYTES, BLD GAS, ICA, H+H)     Status: Abnormal   Collection Time: 11/21/18  6:55 AM  Result Value Ref Range   pH, Arterial 7.420 7.350 - 7.450   pCO2 arterial 30.7 (L) 32.0 - 48.0 mmHg   pO2, Arterial 446.0 (H) 83.0 - 108.0 mmHg   Bicarbonate 19.7 (L) 20.0 - 28.0 mmol/L   TCO2 21 (L) 22 - 32 mmol/L   O2 Saturation 100.0 %   Acid-base deficit 3.0 (H) 0.0 - 2.0 mmol/L   Sodium 136 135 - 145 mmol/L   Potassium 3.3 (L) 3.5 - 5.1 mmol/L   Calcium, Ion 1.08 (L) 1.15 - 1.40 mmol/L   HCT 38.0 (L) 39.0 - 52.0 %   Hemoglobin 12.9 (L) 13.0 - 17.0 g/dL   Patient temperature 100.6 F    Collection site BRACHIAL ARTERY    Drawn by RT    Sample type ARTERIAL   Influenza panel by PCR (type A & B)     Status: None   Collection Time: 11/21/18  7:04 AM  Result Value Ref Range   Influenza A By PCR NEGATIVE NEGATIVE   Influenza B By PCR NEGATIVE NEGATIVE    Comment: (NOTE) The Xpert Xpress Flu assay is intended as an aid in the diagnosis of  influenza and should not be used as a sole basis for treatment.  This  assay is FDA approved for nasopharyngeal swab specimens only. Nasal  washings and aspirates are unacceptable for Xpert Xpress Flu testing. Performed at [facility], [place] [address] [place], [place] [address]   Urinalysis, Routine w reflex microscopic     Status: Abnormal   Collection Time: 11/21/18  8:05 AM  Result Value Ref Range   Color, Urine YELLOW (A) YELLOW   APPearance CLOUDY (A) CLEAR   Specific Gravity, Urine >1.030 (H) 1.005 - 1.030   pH 6.0 5.0 - 8.0   Glucose, UA NEGATIVE NEGATIVE mg/dL   Hgb urine dipstick SMALL (A) NEGATIVE   Bilirubin Urine NEGATIVE NEGATIVE   Ketones, ur NEGATIVE NEGATIVE mg/dL   Protein, ur 30 (A) NEGATIVE mg/dL   Nitrite NEGATIVE NEGATIVE   Leukocytes, UA SMALL (A) NEGATIVE   RBC / HPF >50 (H) 0 - 5 RBC/hpf   WBC, UA >50 (H) 0 - 5 WBC/hpf   Bacteria,  UA FEW (A) NONE SEEN   WBC Clumps PRESENT    Mucus PRESENT    Budding Yeast PRESENT     Comment: Performed at [facility] [address], [place], [place] [address]  Culture, Urine     Status: None (Preliminary result)   Collection Time: 11/21/18  8:05 AM  Result Value Ref Range   Specimen Description URINE, CATHETERIZED    Special Requests      Normal Performed at [facility] [address], [place], [place] [address]    Culture CULTURE REINCUBATED FOR BETTER GROWTH    Report Status PENDING   Strep pneumoniae urinary antigen     Status: None   Collection Time: 11/21/18  8:05 AM  Result Value Ref Range   Strep Pneumo Urinary Antigen NEGATIVE NEGATIVE    Comment:  Infection due to S. pneumoniae cannot be absolutely ruled out since the antigen present may be below the detection limit of the test. Performed at [facility], [address], [place], [place] [address]   Legionella Pneumophila Serogp 1 Ur Ag     Status: None   Collection Time: 11/21/18  8:05 AM  Result Value Ref Range   L. pneumophila Serogp 1 Ur Ag Negative Negative    Comment: (NOTE) Presumptive negative for L. pneumophila serogroup 1 antigen in urine, suggesting no recent or current infection. Legionnaires' disease cannot be ruled out since other serogroups and species may also cause disease. Performed At: [facility] [place], [place] [id number] Rush Farmer MD OI:[id number]    Source of Sample URINE, CATHETERIZED     Comment: Performed at [facility], [place] [address] [place], [place] [address]  Comprehensive metabolic panel     Status: Abnormal   Collection Time: 11/21/18  8:51 AM  Result Value Ref Range   Sodium 138 135 - 145 mmol/L   Potassium 3.1 (L) 3.5 - 5.1 mmol/L   Chloride 107 98 - 111 mmol/L   CO2 18 (L) 22 - 32 mmol/L   Glucose, Bld 188 (H) 70 - 99 mg/dL   BUN 20 8 - 23 mg/dL   Creatinine, Ser 1.09 0.61 - 1.24 mg/dL   Calcium 7.8  (L) 8.9 - 10.3 mg/dL   Total Protein 5.8 (L) 6.5 - 8.1 g/dL   Albumin 2.0 (L) 3.5 - 5.0 g/dL   AST 25 15 - 41 U/L   ALT 21 0 - 44 U/L   Alkaline Phosphatase 87 38 - 126 U/L   Total Bilirubin 1.4 (H) 0.3 - 1.2 mg/dL   GFR calc non Af Amer 60 (L) >60 mL/min   GFR calc Af Amer >60 >60 mL/min   Anion gap 13 5 - 15    Comment: Performed at [facility], [place] [address] [place], [place] [address]  Magnesium     Status: None   Collection Time: 11/21/18  8:51 AM  Result Value Ref Range   Magnesium 1.8 1.7 - 2.4 mg/dL    Comment: Performed at [facility] [address] [place], [place] [address]  Phosphorus     Status: Abnormal   Collection Time: 11/21/18  8:51 AM  Result Value Ref Range   Phosphorus 1.9 (L) 2.5 - 4.6 mg/dL    Comment: Performed at [facility] [address], [place], [place] [address]  Procalcitonin     Status: None   Collection Time: 11/21/18  8:51 AM  Result Value Ref Range   Procalcitonin 3.53 ng/mL    Comment:        Interpretation: PCT > 2 ng/mL: Systemic infection (sepsis) is likely, unless other causes are known. (NOTE)       Sepsis PCT Algorithm           Lower Respiratory Tract                                      Infection PCT Algorithm    ----------------------------     ----------------------------         PCT < 0.25 ng/mL                PCT < 0.10 ng/mL         Strongly  encourage             Strongly discourage   discontinuation of antibiotics    initiation of antibiotics    ----------------------------     -----------------------------       PCT 0.25 - 0.50 ng/mL            PCT 0.10 - 0.25 ng/mL               OR       >80% decrease in PCT            Discourage initiation of                                            antibiotics      Encourage discontinuation           of antibiotics    ----------------------------     -----------------------------         PCT >= 0.50 ng/mL              PCT 0.26 - 0.50 ng/mL                AND       <80% decrease in PCT              Encourage initiation of                                             antibiotics       Encourage continuation           of antibiotics    ----------------------------     -----------------------------        PCT >= 0.50 ng/mL                  PCT > 0.50 ng/mL               AND         increase in PCT                  Strongly encourage                                      initiation of antibiotics    Strongly encourage escalation           of antibiotics                                     -----------------------------                                           PCT <= 0.25 ng/mL                                                 OR                                        >  80% decrease in PCT                                     Discontinue / Do not initiate                                             antibiotics Performed at [facility], [place] [address], [place], [place] [address]   Cortisol     Status: None   Collection Time: 11/21/18  8:51 AM  Result Value Ref Range   Cortisol, Plasma 30.8 ug/dL    Comment: (NOTE) AM    6.7 - 22.6 ug/dL PM   <10.0       ug/dL Performed at [facility] [address] [place], [place] [address]   CBC     Status: Abnormal   Collection Time: 11/21/18  8:51 AM  Result Value Ref Range   WBC 13.7 (H) 4.0 - 10.5 K/uL   RBC 3.60 (L) 4.22 - 5.81 MIL/uL   Hemoglobin 11.4 (L) 13.0 - 17.0 g/dL   HCT 33.5 (L) 39.0 - 52.0 %   MCV 93.1 80.0 - 100.0 fL   MCH 31.7 26.0 - 34.0 pg   MCHC 34.0 30.0 - 36.0 g/dL   RDW 14.6 11.5 - 15.5 %   Platelets 127 (L) 150 - 400 K/uL   nRBC 0.0 0.0 - 0.2 %    Comment: Performed at [facility], [place] [address], [place], [place] [address]  Protime-INR     Status: Abnormal   Collection Time: 11/21/18  8:51 AM  Result Value Ref Range   Prothrombin Time 16.4 (H) 11.4 - 15.2 seconds   INR 1.33     Comment: Performed at [facility] [address],  [place], [place] [address]  APTT     Status: None   Collection Time: 11/21/18  8:51 AM  Result Value Ref Range   aPTT 34 24 - 36 seconds    Comment: Performed at [facility] [address], [place], [place] [address]  Amylase     Status: Abnormal   Collection Time: 11/21/18  8:51 AM  Result Value Ref Range   Amylase 10 (L) 28 - 100 U/L    Comment: Performed at [facility], [place] [address], [place], [place] [address]  Lipase, blood     Status: None   Collection Time: 11/21/18  8:51 AM  Result Value Ref Range   Lipase 20 11 - 51 U/L    Comment: Performed at [facility] [address] [place], [place] [address]  Troponin I - Now Then Q6H     Status: Abnormal   Collection Time: 11/21/18  8:51 AM  Result Value Ref Range   Troponin I 0.06 (HH) <0.03 ng/mL    Comment: CRITICAL RESULT CALLED TO, READ BACK BY AND VERIFIED WITH: R MICHAEL,RN AT 1130 11/21/2018 BY L BENFIELD Performed at [facility], [place] [address], [place],  [address]   Brain natriuretic peptide     Status: Abnormal   Collection Time: 11/21/18  8:51 AM  Result Value Ref Range   B Natriuretic Peptide 332.5 (H) 0.0 - 100.0 pg/mL    Comment: Performed at [facility],  [address] [place], [place] [address]  Glucose, capillary     Status: Abnormal   Collection Time: 11/21/18  8:56 AM  Result Value Ref Range   Glucose-Capillary 175 (H) 70 - 99 mg/dL  Respiratory Panel by PCR     Status: None   Collection Time: 11/21/18  9:18 AM  Result Value Ref Range   Adenovirus NOT DETECTED NOT DETECTED   Coronavirus 229E NOT DETECTED NOT DETECTED    Comment: (NOTE) The Coronavirus on the Respiratory Panel, DOES NOT test for the novel  Coronavirus (2019 nCoV)    Coronavirus HKU1 NOT DETECTED NOT DETECTED   Coronavirus NL63 NOT DETECTED NOT DETECTED   Coronavirus OC43 NOT DETECTED NOT DETECTED   Metapneumovirus NOT DETECTED NOT DETECTED   Rhinovirus / Enterovirus NOT DETECTED NOT DETECTED    Influenza A NOT DETECTED NOT DETECTED   Influenza B NOT DETECTED NOT DETECTED   Parainfluenza Virus 1 NOT DETECTED NOT DETECTED   Parainfluenza Virus 2 NOT DETECTED NOT DETECTED   Parainfluenza Virus 3 NOT DETECTED NOT DETECTED   Parainfluenza Virus 4 NOT DETECTED NOT DETECTED   Respiratory Syncytial Virus NOT DETECTED NOT DETECTED   Bordetella pertussis NOT DETECTED NOT DETECTED   Chlamydophila pneumoniae NOT DETECTED NOT DETECTED   Mycoplasma pneumoniae NOT DETECTED NOT DETECTED    Comment: Performed at [facility], [place] [address] [place], [place] [address]  MRSA PCR Screening     Status: None   Collection Time: 11/21/18  9:18 AM  Result Value Ref Range   MRSA by PCR NEGATIVE NEGATIVE    Comment:        The GeneXpert MRSA Assay (FDA approved for NASAL specimens only), is one component of a comprehensive MRSA colonization surveillance program. It is not intended to diagnose MRSA infection nor to guide or monitor treatment for MRSA infections. Performed at [facility], [place] [address], [place], [place] [address]   I-STAT 7, (LYTES, BLD GAS, ICA, H+H)     Status: Abnormal   Collection Time: 11/21/18 10:35 AM  Result Value Ref Range   pH, Arterial 7.473 (H) 7.350 - 7.450   pCO2 arterial 28.3 (L) 32.0 - 48.0 mmHg   pO2, Arterial 187.0 (H) 83.0 - 108.0 mmHg   Bicarbonate 20.7 20.0 - 28.0 mmol/L   TCO2 22 22 - 32 mmol/L   O2 Saturation 100.0 %   Acid-base deficit 2.0 0.0 - 2.0 mmol/L   Sodium 138 135 - 145 mmol/L   Potassium 3.4 (L) 3.5 - 5.1 mmol/L   Calcium, Ion 1.14 (L) 1.15 - 1.40 mmol/L   HCT 30.0 (L) 39.0 - 52.0 %   Hemoglobin 10.2 (L) 13.0 - 17.0 g/dL   Patient temperature 98.6 F    Collection site RADIAL, ALLEN'S TEST ACCEPTABLE    Drawn by Operator    Sample type ARTERIAL   Lactic acid, plasma     Status: Abnormal   Collection Time: 11/21/18 11:22 AM  Result Value Ref Range   Lactic Acid, Venous 2.0 (HH) 0.5 - 1.9 mmol/L    Comment: CRITICAL RESULT  CALLED TO, READ BACK BY AND VERIFIED WITH: R MICHAEL,RN AT 1154 11/21/2018 BY L BENFIELD Performed at [facility], [address] [place], [place] [address]   Glucose, capillary     Status: Abnormal   Collection Time: 11/21/18 11:55 AM  Result Value Ref Range   Glucose-Capillary 189 (H) 70 - 99 mg/dL  Troponin I - Now Then Q6H  Status: Abnormal   Collection Time: 11/21/18  1:50 PM  Result Value Ref Range   Troponin I 0.05 (HH) <0.03 ng/mL    Comment: CRITICAL VALUE NOTED.  VALUE IS CONSISTENT WITH PREVIOUSLY REPORTED AND CALLED VALUE. Performed at [facility], [place] [address] [place], [place] [address]   Magnesium     Status: None   Collection Time: 11/21/18  1:50 PM  Result Value Ref Range   Magnesium 1.8 1.7 - 2.4 mg/dL    Comment: Performed at [facility] [address], [place], [place] [address]  Phosphorus     Status: Abnormal   Collection Time: 11/21/18  1:50 PM  Result Value Ref Range   Phosphorus 2.3 (L) 2.5 - 4.6 mg/dL    Comment: Performed at [facility] [address], [place], [place] [address]  Culture, respiratory (non-expectorated)     Status: None (Preliminary result)   Collection Time: 11/21/18  4:42 PM  Result Value Ref Range   Specimen Description TRACHEAL ASPIRATE    Special Requests      Normal Performed at [facility] [address] [place], [place] [address]    Gram Stain      RARE WBC PRESENT, PREDOMINANTLY PMN RARE GRAM POSITIVE COCCI    Culture PENDING    Report Status PENDING   Glucose, capillary     Status: Abnormal   Collection Time: 11/21/18  4:50 PM  Result Value Ref Range   Glucose-Capillary 133 (H) 70 - 99 mg/dL  Troponin I - Now Then Q6H     Status: Abnormal   Collection Time: 11/21/18  7:39 PM  Result Value Ref Range   Troponin I 0.04 (HH) <0.03 ng/mL    Comment: CRITICAL VALUE NOTED.  VALUE IS CONSISTENT WITH PREVIOUSLY REPORTED AND CALLED VALUE. Performed at [facility], [place]  [address], [place], [place] [address]   Glucose, capillary     Status: Abnormal   Collection Time: 11/21/18  8:08 PM  Result Value Ref Range   Glucose-Capillary 126 (H) 70 - 99 mg/dL  Glucose, capillary     Status: Abnormal   Collection Time: 11/21/18 11:41 PM  Result Value Ref Range   Glucose-Capillary 127 (H) 70 - 99 mg/dL  Glucose, capillary     Status: Abnormal   Collection Time: 11/22/18  3:26 AM  Result Value Ref Range   Glucose-Capillary 140 (H) 70 - 99 mg/dL  CBC     Status: Abnormal   Collection Time: 11/22/18  3:29 AM  Result Value Ref Range   WBC 7.7 4.0 - 10.5 K/uL   RBC 3.25 (L) 4.22 - 5.81 MIL/uL   Hemoglobin 10.0 (L) 13.0 - 17.0 g/dL   HCT 30.4 (L) 39.0 - 52.0 %   MCV 93.5 80.0 - 100.0 fL   MCH 30.8 26.0 - 34.0 pg   MCHC 32.9 30.0 - 36.0 g/dL   RDW 14.6 11.5 - 15.5 %   Platelets 107 (L) 150 - 400 K/uL    Comment: REPEATED TO VERIFY PLATELET COUNT CONFIRMED BY SMEAR Immature Platelet Fraction may be clinically indicated, consider ordering this additional test [id number]    nRBC 0.0 0.0 - 0.2 %    Comment: Performed at [facility], [place] [address], [place], [place] [address]  Basic metabolic panel     Status: Abnormal   Collection Time: 11/22/18  3:29 AM  Result Value Ref Range   Sodium 140 135 - 145  mmol/L   Potassium 3.3 (L) 3.5 - 5.1 mmol/L   Chloride 113 (H) 98 - 111 mmol/L   CO2 19 (L) 22 - 32 mmol/L   Glucose, Bld 153 (H) 70 - 99 mg/dL   BUN 23 8 - 23 mg/dL   Creatinine, Ser 0.89 0.61 - 1.24 mg/dL   Calcium 7.6 (L) 8.9 - 10.3 mg/dL   GFR calc non Af Amer >60 >60 mL/min   GFR calc Af Amer >60 >60 mL/min   Anion gap 8 5 - 15    Comment: Performed at [facility] [address], [place], [place] [address]  Procalcitonin     Status: None   Collection Time: 11/22/18  3:29 AM  Result Value Ref Range   Procalcitonin 9.53 ng/mL    Comment:        Interpretation: PCT > 2 ng/mL: Systemic infection (sepsis) is likely, unless other causes are  known. (NOTE)       Sepsis PCT Algorithm           Lower Respiratory Tract                                      Infection PCT Algorithm    ----------------------------     ----------------------------         PCT < 0.25 ng/mL                PCT < 0.10 ng/mL         Strongly encourage             Strongly discourage   discontinuation of antibiotics    initiation of antibiotics    ----------------------------     -----------------------------       PCT 0.25 - 0.50 ng/mL            PCT 0.10 - 0.25 ng/mL               OR       >80% decrease in PCT            Discourage initiation of                                            antibiotics      Encourage discontinuation           of antibiotics    ----------------------------     -----------------------------         PCT >= 0.50 ng/mL              PCT 0.26 - 0.50 ng/mL               AND       <80% decrease in PCT              Encourage initiation of                                             antibiotics       Encourage continuation           of antibiotics    ----------------------------     -----------------------------        PCT >=  0.50 ng/mL                  PCT > 0.50 ng/mL               AND         increase in PCT                  Strongly encourage                                      initiation of antibiotics    Strongly encourage escalation           of antibiotics                                     -----------------------------                                           PCT <= 0.25 ng/mL                                                 OR                                        > 80% decrease in PCT                                     Discontinue / Do not initiate                                             antibiotics Performed at [facility], [address], [place], [place] [address]   Magnesium     Status: None   Collection Time: 11/22/18  3:29 AM  Result Value Ref Range   Magnesium 1.9 1.7 - 2.4 mg/dL     Comment: Performed at [facility] [address], [place], [place] [address]  Phosphorus     Status: Abnormal   Collection Time: 11/22/18  3:29 AM  Result Value Ref Range   Phosphorus 2.3 (L) 2.5 - 4.6 mg/dL    Comment: Performed at [facility] [address] [place], [place] [address]  Glucose, capillary     Status: Abnormal   Collection Time: 11/22/18  8:32 AM  Result Value Ref Range   Glucose-Capillary 143 (H) 70 - 99 mg/dL  Comprehensive metabolic panel     Status: Abnormal   Collection Time: 11/22/18 12:29 PM  Result Value Ref Range   Sodium 139 135 - 145 mmol/L   Potassium 3.4 (L) 3.5 - 5.1 mmol/L   Chloride 113 (H) 98 - 111 mmol/L   CO2 20 (L) 22 - 32 mmol/L   Glucose, Bld 181 (H) 70 - 99 mg/dL   BUN 24 (H) 8 -  23 mg/dL   Creatinine, Ser 0.89 0.61 - 1.24 mg/dL   Calcium 7.7 (L) 8.9 - 10.3 mg/dL   Total Protein 5.3 (L) 6.5 - 8.1 g/dL   Albumin 1.6 (L) 3.5 - 5.0 g/dL   AST 36 15 - 41 U/L   ALT 32 0 - 44 U/L   Alkaline Phosphatase 75 38 - 126 U/L   Total Bilirubin 0.9 0.3 - 1.2 mg/dL   GFR calc non Af Amer >60 >60 mL/min   GFR calc Af Amer >60 >60 mL/min   Anion gap 6 5 - 15    Comment: Performed at [facility], [place] [address] [place], [place] [address]  Amylase     Status: Abnormal   Collection Time: 11/22/18 12:29 PM  Result Value Ref Range   Amylase 8 (L) 28 - 100 U/L    Comment: Performed at [facility], [place] [address] [place], [place] [address]  Lipase, blood     Status: None   Collection Time: 11/22/18 12:29 PM  Result Value Ref Range   Lipase 18 11 - 51 U/L    Comment: Performed at [facility], [place] [address], [place], [place] [address]  Protime-INR     Status: Abnormal   Collection Time: 11/22/18 12:29 PM  Result Value Ref Range   Prothrombin Time 16.9 (H) 11.4 - 15.2 seconds   INR 1.39     Comment: Performed at [facility] [address], [place], [place] [address]  Glucose, capillary     Status: Abnormal    Collection Time: 11/22/18 12:32 PM  Result Value Ref Range   Glucose-Capillary 153 (H) 70 - 99 mg/dL    Ct Abdomen Pelvis Wo Contrast  Result Date: 11/21/2018 CLINICAL DATA:  Sepsis. EXAM: CT ABDOMEN AND PELVIS WITHOUT CONTRAST TECHNIQUE: Multidetector CT imaging of the abdomen and pelvis was performed following the standard protocol without IV contrast. COMPARISON:  CT scan 09/04/2018 FINDINGS: Lower chest: The heart is upper limits normal in size for age. Coronary artery calcifications and evidence of prior TAVR. Small bilateral pleural effusions and bibasilar atelectasis or infiltrates. Hepatobiliary: No focal hepatic lesions are identified without contrast. Numerous gallstones are noted in the gallbladder. Suspect mild gallbladder wall thickening and mild pericholecystic inflammatory changes. Could not exclude acute cholecystitis. No common bile duct dilatation. Pancreas: Moderate atrophy but no mass or acute inflammation. Spleen: Normal size.  No focal lesions. Adrenals/Urinary Tract: The adrenal glands are unremarkable and stable. Bilateral renal cysts but no worrisome renal masses. No renal or obstructing ureteral calculi. There is a Foley catheter in a decompressed bladder. No bladder mass or bladder calculi. Stomach/Bowel: The stomach contains an NG tube. The duodenum, small bowel and colon are grossly normal. No acute inflammatory process, mass lesion or obstructive findings. Evidence of a prior right hemicolectomy. Sigmoid colon diverticulosis without findings for acute diverticulitis. Small amount of fluid noted in the right pericolic gutter and in the anterior pararenal space. Vascular/Lymphatic: Advanced atherosclerotic calcifications involving the aorta. 4.6 x 4.0 cm infrarenal abdominal aortic aneurysm with a left-sided saccular component. This measured a maximum of 4.5 cm on the prior study. Reproductive: The prostate gland and seminal vesicles are grossly normal. Other: Small amount of free  fluid in the pelvis but no pelvic mass or adenopathy. No inguinal mass or adenopathy. Musculoskeletal: No significant bony findings. Stable compression fracture of L1 with a vertebral plana appearance. IMPRESSION: 1. Cholelithiasis with findings suspicious for acute cholecystitis. Recommend  correlation with right upper quadrant ultrasound examination. 2. Stable infrarenal abdominal aortic aneurysm with a left-sided saccular component. 3. Small bilateral pleural effusions and bibasilar atelectasis or infiltrates. Electronically Signed   By: Marijo Sanes M.D.   On: 11/21/2018 08:57   US Abdomen Complete  Result Date: 11/21/2018 CLINICAL DATA:  Acute cholecystitis an urinary tract infection. Patient is intubated. Elevated total bilirubin. EXAM: ABDOMEN ULTRASOUND COMPLETE COMPARISON:  CT on 11/21/2018 a prior CT exams including 12/07/2013 FINDINGS: Gallbladder: Gallbladder wall is irregular and thickened, measuring 7 millimeters. Dependent stones are present, measuring on the order of 1.7 centimeters or smaller. Common bile duct: Diameter: 5.4 millimeter Liver: The visualized portion of the liver is unremarkable. LEFT hepatic lobe is not well seen because of bowel gas. Portal vein is patent on color Doppler imaging with normal direction of blood flow towards the liver. IVC: Visualized portion normal. Pancreas: Obscured by bowel gas. Spleen: Obscured by bowel gas. Right Kidney: Length: 11.0 centimeters. 4.6 x 4.5 x 5.2 centimeter cyst containing internal septation. On CT exam 2015, this lesion measured 3.2 x 3.2 centimeters. No hydronephrosis. Left Kidney: Length: 12.3 centimeters. Simple cyst is 3.5 x 2.4 x 2.9 centimeters. No hydronephrosis. Abdominal aorta: Not visualized on the level of the mid abdominal aorta to the bifurcation segment 1 bowel gas. No visualized aneurysm. Other findings: Study quality is degraded by patient body habitus and intubation. Note is made of a small RIGHT pleural effusion. IMPRESSION:  1. Irregular, thickened gallbladder wall and multiple gallstones. Presence or absence of sonographic Murphy's can not be assessed due to the patient's intubated status. Findings are suspicious for acute cholecystitis however. 2. RIGHT pleural effusion. 3. Complex cystic lesion within the UPPER pole the RIGHT kidney. Lesion has progressed since prior studies. If possible and needed, consider further characterization with outpatient contrast-enhanced CT or MRI with intravenous contrast. Electronically Signed   By: Nolon Nations M.D.   On: 11/21/2018 12:46   Dg Chest Portable 1 View  Result Date: 11/21/2018 CLINICAL DATA:  Intubation. EXAM: PORTABLE CHEST 1 VIEW COMPARISON:  Earlier film, same date. FINDINGS: The endotracheal tube is right at the carina and should be retracted approximately 3 cm. The NG tube is coursing down the esophagus and into the stomach. Stable cardiac enlargement. Bibasilar atelectasis but no definite infiltrates or effusions. IMPRESSION: The endotracheal tube is right at the carina and should be retracted at least 3 cm. Bibasilar atelectasis but no edema or infiltrates Electronically Signed   By: Marijo Sanes M.D.   On: 11/21/2018 07:40   Dg Chest Port 1 View  Result Date: 11/21/2018 CLINICAL DATA:  Sepsis. EXAM: PORTABLE CHEST 1 VIEW COMPARISON:  09/16/2018 FINDINGS: The heart is enlarged but stable. Marked tortuosity and calcification of the thoracic aorta. Bibasilar atelectasis. No definite pleural effusions. No obvious infiltrates. IMPRESSION: Cardiac enlargement and marked tortuosity and calcification of the thoracic aorta. No definite infiltrates, effusions or edema. Streaky basilar atelectasis. Electronically Signed   By: Marijo Sanes M.D.   On: 11/21/2018 06:21    Review of Systems  Unable to perform ROS: Patient unresponsive   Blood pressure 93/65, pulse 91, temperature 100.2 F (37.9 C), resp. rate 18, height 5\' 11"  (1.803 m), weight 95.3 kg, SpO2 100 %. Physical  Exam  Constitutional:  Intubated sedated   Eyes: No scleral icterus.  GI: Soft. There is no abdominal tenderness.      Assessment/Plan: Sepsis, possible cholecystitis  It would be somewhat odd to be this ill from cholecystitis  but with no other source recommend perc chole today. Will follow  Rolm Bookbinder 11/22/2018, 3:07 PM

## 2018-11-22 NOTE — Procedures (Signed)
Interventional Radiology Procedure Note  Procedure: Perc chole. 12F drain.  Complications: None Recommendations:  Follow up culture - Do not submerge - Routine drain care   Signed,  Dulcy Fanny. Earleen [facility], DO

## 2018-11-23 ENCOUNTER — Inpatient Hospital Stay (HOSPITAL_COMMUNITY): Payer: Medicare Other

## 2018-11-23 ENCOUNTER — Encounter (HOSPITAL_COMMUNITY): Payer: Self-pay | Admitting: Interventional Radiology

## 2018-11-23 DIAGNOSIS — R0609 Other forms of dyspnea: Secondary | ICD-10-CM

## 2018-11-23 LAB — POCT I-STAT 7, (LYTES, BLD GAS, ICA,H+H)
Acid-base deficit: 1 mmol/L (ref 0.0–2.0)
Acid-base deficit: 3 mmol/L — ABNORMAL HIGH (ref 0.0–2.0)
Bicarbonate: 20.1 mmol/L (ref 20.0–28.0)
Bicarbonate: 20.6 mmol/L (ref 20.0–28.0)
Calcium, Ion: 1.16 mmol/L (ref 1.15–1.40)
Calcium, Ion: 1.18 mmol/L (ref 1.15–1.40)
HCT: 58 % — ABNORMAL HIGH (ref 39.0–52.0)
HCT: 44 % (ref 39.0–52.0)
HEMOGLOBIN: 15 g/dL (ref 13.0–17.0)
Hemoglobin: 19.7 g/dL — ABNORMAL HIGH (ref 13.0–17.0)
O2 Saturation: 100 %
O2 Saturation: 100 %
PCO2 ART: 25.7 mmHg — AB (ref 32.0–48.0)
POTASSIUM: 3.4 mmol/L — AB (ref 3.5–5.1)
Patient temperature: 98.6
Potassium: 3.2 mmol/L — ABNORMAL LOW (ref 3.5–5.1)
Sodium: 140 mmol/L (ref 135–145)
Sodium: 142 mmol/L (ref 135–145)
TCO2: 21 mmol/L — ABNORMAL LOW (ref 22–32)
TCO2: 22 mmol/L (ref 22–32)
pCO2 arterial: 32.6 mmHg (ref 32.0–48.0)
pH, Arterial: 7.5 — ABNORMAL HIGH (ref 7.350–7.450)
pH, Arterial: 7.407 (ref 7.350–7.450)
pO2, Arterial: 216 mmHg — ABNORMAL HIGH (ref 83.0–108.0)
pO2, Arterial: 166 mmHg — ABNORMAL HIGH (ref 83.0–108.0)

## 2018-11-23 LAB — COMPREHENSIVE METABOLIC PANEL
ALT: 41 U/L (ref 0–44)
AST: 46 U/L — ABNORMAL HIGH (ref 15–41)
Albumin: 1.7 g/dL — ABNORMAL LOW (ref 3.5–5.0)
Alkaline Phosphatase: 79 U/L (ref 38–126)
Anion gap: 8 (ref 5–15)
BUN: 23 mg/dL (ref 8–23)
CO2: 20 mmol/L — ABNORMAL LOW (ref 22–32)
Calcium: 7.4 mg/dL — ABNORMAL LOW (ref 8.9–10.3)
Chloride: 113 mmol/L — ABNORMAL HIGH (ref 98–111)
Creatinine, Ser: 0.89 mg/dL (ref 0.61–1.24)
GFR calc Af Amer: >60 mL/min (ref >60)
GFR calc non Af Amer: >60 mL/min (ref >60)
GLUCOSE: 147 mg/dL — AB (ref 70–99)
Potassium: 3.1 mmol/L — ABNORMAL LOW (ref 3.5–5.1)
SODIUM: 141 mmol/L (ref 135–145)
Total Bilirubin: 0.8 mg/dL (ref 0.3–1.2)
Total Protein: 5.3 g/dL — ABNORMAL LOW (ref 6.5–8.1)

## 2018-11-23 LAB — CBC
HEMATOCRIT: 31 % — AB (ref 39.0–52.0)
Hemoglobin: 10.3 g/dL — ABNORMAL LOW (ref 13.0–17.0)
MCH: 31.6 pg (ref 26.0–34.0)
MCHC: 33.2 g/dL (ref 30.0–36.0)
MCV: 95.1 fL (ref 80.0–100.0)
Platelets: 105 10*3/uL — ABNORMAL LOW (ref 150–400)
RBC: 3.26 MIL/uL — ABNORMAL LOW (ref 4.22–5.81)
RDW: 14.6 % (ref 11.5–15.5)
WBC: 6.4 10*3/uL (ref 4.0–10.5)
nRBC: 0 % (ref 0.0–0.2)

## 2018-11-23 LAB — BLOOD GAS, ARTERIAL
Acid-base deficit: 2.4 mmol/L — ABNORMAL HIGH (ref 0.0–2.0)
BICARBONATE: 20.6 mmol/L (ref 20.0–28.0)
Drawn by: 414221
FIO2: 40
MECHVT: 600 mL
O2 Saturation: 98.7 %
PEEP: 5 cmH2O
Patient temperature: 99.3
RATE: 18 resp/min
pCO2 arterial: 29 mmHg — ABNORMAL LOW (ref 32.0–48.0)
pH, Arterial: 7.468 — ABNORMAL HIGH (ref 7.350–7.450)
pO2, Arterial: 117 mmHg — ABNORMAL HIGH (ref 83.0–108.0)

## 2018-11-23 LAB — CULTURE, BLOOD (ROUTINE X 2)
SPECIAL REQUESTS: ADEQUATE
Special Requests: ADEQUATE

## 2018-11-23 LAB — GLUCOSE, CAPILLARY
GLUCOSE-CAPILLARY: 142 mg/dL — AB (ref 70–99)
Glucose-Capillary: 123 mg/dL — ABNORMAL HIGH (ref 70–99)
Glucose-Capillary: 126 mg/dL — ABNORMAL HIGH (ref 70–99)
Glucose-Capillary: 130 mg/dL — ABNORMAL HIGH (ref 70–99)
Glucose-Capillary: 131 mg/dL — ABNORMAL HIGH (ref 70–99)
Glucose-Capillary: 134 mg/dL — ABNORMAL HIGH (ref 70–99)

## 2018-11-23 LAB — LACTIC ACID, PLASMA: Lactic Acid, Venous: 1.2 mmol/L (ref 0.5–1.9)

## 2018-11-23 LAB — MAGNESIUM: Magnesium: 2.1 mg/dL (ref 1.7–2.4)

## 2018-11-23 LAB — ECHOCARDIOGRAM COMPLETE
Height: 71 in
Weight: 3612.02 oz

## 2018-11-23 LAB — PHOSPHORUS: Phosphorus: 2.3 mg/dL — ABNORMAL LOW (ref 2.5–4.6)

## 2018-11-23 LAB — PROCALCITONIN: Procalcitonin: 6.24 ng/mL

## 2018-11-23 MED ORDER — POTASSIUM CHLORIDE 20 MEQ/15ML (10%) PO SOLN
40.0000 meq | Freq: Once | ORAL | Status: AC
  Administered 2018-11-23: 40 meq
  Filled 2018-11-23: qty 30

## 2018-11-23 MED ORDER — SODIUM CHLORIDE 0.9% FLUSH: 10.0000 mL | INTRAVENOUS | Status: DC | PRN

## 2018-11-23 MED ORDER — FENTANYL CITRATE (PF) 100 MCG/2ML IJ SOLN: 50.0000 ug | INTRAMUSCULAR | Status: DC | PRN

## 2018-11-23 MED ORDER — POTASSIUM PHOSPHATES 15 MMOLE/5ML IV SOLN
20.0000 mmol | Freq: Once | INTRAVENOUS | Status: AC
  Administered 2018-11-23: 20 mmol via INTRAVENOUS
  Filled 2018-11-23: qty 6.67

## 2018-11-23 MED ORDER — SODIUM CHLORIDE 0.9 % IV SOLN
700.0000 mg | Freq: Every day | INTRAVENOUS | Status: DC
  Administered 2018-11-23 – 2018-11-24 (×2): 700 mg via INTRAVENOUS
  Filled 2018-11-23 (×4): qty 14

## 2018-11-23 MED ORDER — PERFLUTREN LIPID MICROSPHERE
INTRAVENOUS | Status: AC
  Administered 2018-11-23: 5 mL via INTRAVENOUS
  Filled 2018-11-23: qty 10

## 2018-11-23 MED ORDER — PERFLUTREN LIPID MICROSPHERE
1.0000 mL | INTRAVENOUS | Status: AC | PRN
  Administered 2018-11-23: 5 mL via INTRAVENOUS
  Administered 2018-11-23: 2 mL via INTRAVENOUS
  Filled 2018-11-23: qty 10

## 2018-11-23 MED ORDER — SODIUM CHLORIDE 0.9% FLUSH
10.0000 mL | Freq: Two times a day (BID) | INTRAVENOUS | Status: DC
  Administered 2018-11-24 (×2): 10 mL

## 2018-11-23 NOTE — Progress Notes (Signed)
  Echocardiogram 2D Echocardiogram has been performed.  William [name] 11/23/2018, 3:27 PM

## 2018-11-23 NOTE — Progress Notes (Signed)
eLink Physician-Brief Progress Note Patient Name: William [name] DOB: [birth year] MRN: [id number]   Date of Service  11/23/2018  HPI/Events of Note  K+ = 3.1, Ca++ = 7.4 which corrects to 9.24 (normal) given albumin = 1.7 and Creatinine = 0.89  eICU Interventions  Will order: 1. Replace K+.      Intervention Category Major Interventions: Electrolyte abnormality - evaluation and management  Sommer,Steven Eugene 11/23/2018, 5:07 AM

## 2018-11-23 NOTE — Procedures (Signed)
Extubation Procedure Note  Patient Details:   Name: William [name] DOB: [birth year] MRN: [id number]   Airway Documentation:    Vent end date: 11/23/18 Vent end time: 1548   Evaluation  O2 sats: stable throughout Complications: No apparent complications Patient did tolerate procedure well. Bilateral Breath Sounds: Clear   Yes  Pt extubated to 3L [place], pt was able to speak and cough. Pt has strong cough, SATS are stable, RT will continue to monitor.  Trevell Pariseau, Leonie Douglas 11/23/2018, 3:49 PM

## 2018-11-23 NOTE — Progress Notes (Signed)
Patient ID: William [name], male   DOB: [birth year], 85 y.o.   MRN: [id number]       Subjective: Sedated on vent.  Objective: Vital signs in last 24 hours: Temp:  [98.9 F (37.2 C)-100.6 F (38.1 C)] 99.3 F (37.4 C) (02/10 0945) Pulse Rate:  [38-96] 89 (02/10 0945) Resp:  [5-22] 17 (02/10 0945) BP: (86-109)/(54-69) 89/60 (02/10 0930) SpO2:  [98 %-100 %] 100 % (02/10 0945) Arterial Line BP: (62-93)/(53-80) 93/67 (02/10 0200) FiO2 (%):  [30 %-100 %] 30 % (02/10 0754) Weight:  [102.4 kg] 102.4 kg (02/10 0340) Last BM Date: (PTA)  Intake/Output from previous day: 02/09 0701 - 02/10 0700 In: 4239.4 [I.V.:2090.1; NG/GT:390; IV Piggyback:1754.2] Out: 1100 [Urine:1100] Intake/Output this shift: Total I/O In: 521.5 [I.V.:200.6; IV Piggyback:320.9] Out: 150 [Urine:150]  PE: Gen: NAD, sedated Heart: regular Lungs: CTAB, on vent Abd: soft, does seem to guard slightly to palpation in the RUQ, drain in place with minimal brown-ish output.  Some small gallstones are noted in the drain.  OGT in place with TFs running at 40cc/hr.  Lab Results:  Recent Labs    11/22/18 0329  11/23/18 0355 11/23/18 0751  WBC 7.7  --  6.4  --   HGB 10.0*   < > 10.3* 15.0  HCT 30.4*   < > 31.0* 44.0  PLT 107*  --  105*  --    < > = values in this interval not displayed.   BMET Recent Labs    11/22/18 1229 11/23/18 0355 11/23/18 0751  NA 139 141 142  K 3.4* 3.1* 3.4*  CL 113* 113*  --   CO2 20* 20*  --   GLUCOSE 181* 147*  --   BUN 24* 23  --   CREATININE 0.89 0.89  --   CALCIUM 7.7* 7.4*  --    PT/INR Recent Labs    11/21/18 0851 11/22/18 1229  LABPROT 16.4* 16.9*  INR 1.33 1.39   CMP     Component Value Date/Time   NA 142 11/23/2018 0751   NA 137 10/05/2018 0500   K 3.4 (L) 11/23/2018 0751   CL 113 (H) 11/23/2018 0355   CO2 20 (L) 11/23/2018 0355   GLUCOSE 147 (H) 11/23/2018 0355   BUN 23 11/23/2018 0355   BUN 18 10/05/2018 0500   CREATININE 0.89 11/23/2018 0355   CREATININE 1.12 (H) 02/05/2017 1103   CALCIUM 7.4 (L) 11/23/2018 0355   PROT 5.3 (L) 11/23/2018 0355   ALBUMIN 1.7 (L) 11/23/2018 0355   AST 46 (H) 11/23/2018 0355   ALT 41 11/23/2018 0355   ALKPHOS 79 11/23/2018 0355   BILITOT 0.8 11/23/2018 0355   GFRNONAA >60 11/23/2018 0355   GFRAA >60 11/23/2018 0355   Lipase     Component Value Date/Time   LIPASE 18 11/22/2018 1229       Studies/Results: US Abdomen Complete  Result Date: 11/21/2018 CLINICAL DATA:  Acute cholecystitis an urinary tract infection. Patient is intubated. Elevated total bilirubin. EXAM: ABDOMEN ULTRASOUND COMPLETE COMPARISON:  CT on 11/21/2018 a prior CT exams including 12/07/2013 FINDINGS: Gallbladder: Gallbladder wall is irregular and thickened, measuring 7 millimeters. Dependent stones are present, measuring on the order of 1.7 centimeters or smaller. Common bile duct: Diameter: 5.4 millimeter Liver: The visualized portion of the liver is unremarkable. LEFT hepatic lobe is not well seen because of bowel gas. Portal vein is patent on color Doppler imaging with normal direction of blood flow towards the liver. IVC: Visualized portion normal.  Pancreas: Obscured by bowel gas. Spleen: Obscured by bowel gas. Right Kidney: Length: 11.0 centimeters. 4.6 x 4.5 x 5.2 centimeter cyst containing internal septation. On CT exam 2015, this lesion measured 3.2 x 3.2 centimeters. No hydronephrosis. Left Kidney: Length: 12.3 centimeters. Simple cyst is 3.5 x 2.4 x 2.9 centimeters. No hydronephrosis. Abdominal aorta: Not visualized on the level of the mid abdominal aorta to the bifurcation segment 1 bowel gas. No visualized aneurysm. Other findings: Study quality is degraded by patient body habitus and intubation. Note is made of a small RIGHT pleural effusion. IMPRESSION: 1. Irregular, thickened gallbladder wall and multiple gallstones. Presence or absence of sonographic Murphy's can not be assessed due to the patient's intubated status.  Findings are suspicious for acute cholecystitis however. 2. RIGHT pleural effusion. 3. Complex cystic lesion within the UPPER pole the RIGHT kidney. Lesion has progressed since prior studies. If possible and needed, consider further characterization with outpatient contrast-enhanced CT or MRI with intravenous contrast. Electronically Signed   By: Nolon Nations M.D.   On: 11/21/2018 12:46   Ir Perc Cholecystostomy  Result Date: 11/23/2018 INDICATION: 85 year old male with a history of cholecystitis EXAM: CHOLECYSTOSTOMY MEDICATIONS: In-hospital antibiotics ANESTHESIA/SEDATION: Moderate (conscious) sedation was employed during this procedure. A total of Versed 2.0 mg and Fentanyl 0 mcg was administered intravenously. Moderate Sedation Time: 0 minutes. The patient's level of consciousness and vital signs were monitored continuously by radiology nursing throughout the procedure under my direct supervision. FLUOROSCOPY TIME:  Fluoroscopy Time: 0 minutes 24 seconds (42.6 mGy). COMPLICATIONS: None PROCEDURE: Informed written consent was obtained from the patient and the patient's family after a thorough discussion of the procedural risks, benefits and alternatives. All questions were addressed. Maximal Sterile Barrier Technique was utilized including caps, mask, sterile gowns, sterile gloves, sterile drape, hand hygiene and skin antiseptic. A timeout was performed prior to the initiation of the procedure. Ultrasound survey of the right upper quadrant was performed for planning purposes. Once the patient is prepped and draped in the usual sterile fashion, the skin and subcutaneous tissues overlying the gallbladder were generously infiltrated 1% lidocaine for local anesthesia. A coaxial needle was advanced under ultrasound guidance through the skin subcutaneous tissues and a small segment of liver into the gallbladder lumen. With removal of the stylet, spontaneous dark bile drainage occurred. Using modified Seldinger  technique, a 10 French drain was placed into the gallbladder fossa, with aspiration of the sample for the lab. Contrast injection confirmed position of the tube within the gallbladder lumen. Drainage catheter was attached to gravity drain with a suture retention placed. Patient tolerated the procedure well and remained hemodynamically stable throughout. No complications were encountered and no significant blood loss encountered. IMPRESSION: Status post image guided percutaneous cholecystostomy. Signed, Dulcy Fanny. Dellia Nims, RPVI [facility] Radiology Electronically Signed   By: Corrie Mckusick D.O.   On: 11/23/2018 08:16   Dg Chest Port 1 View  Result Date: 11/23/2018 CLINICAL DATA:  Acute respiratory failure EXAM: PORTABLE CHEST 1 VIEW COMPARISON:  Two days ago FINDINGS: Endotracheal tube tip between the clavicular heads and carina. The orogastric tube reaches the stomach. Low volume chest with streaky density at the bases. There may be trace pleural fluid. No Kerley lines or pneumothorax. Cardiomegaly. Transcatheter aortic valve replacement IMPRESSION: Stable hardware positioning, low lung volumes, and presumed atelectasis. Electronically Signed   By: Monte Fantasia M.D.   On: 11/23/2018 06:53    Anti-infectives: Anti-infectives (From admission, onward)   Start  Dose/Rate Route Frequency Ordered Stop   11/22/18 0800  vancomycin (VANCOCIN) 1,500 mg in sodium chloride 0.9 % 500 mL IVPB     1,500 mg 250 mL/hr over 120 Minutes Intravenous Every 24 hours 11/21/18 0645     11/21/18 1400  meropenem (MERREM) 1 g in sodium chloride 0.9 % 100 mL IVPB     1 g 200 mL/hr over 30 Minutes Intravenous Every 8 hours 11/21/18 0645     11/21/18 0545  meropenem (MERREM) 1 g in sodium chloride 0.9 % 100 mL IVPB     1 g 200 mL/hr over 30 Minutes Intravenous STAT 11/21/18 0541 11/21/18 0648   11/21/18 0530  ceFEPIme (MAXIPIME) 2 g in sodium chloride 0.9 % 100 mL IVPB   Status:  Discontinued     2 g 200 mL/hr over 30 Minutes Intravenous  Once 11/21/18 0526 11/21/18 0535   11/21/18 0530  metroNIDAZOLE (FLAGYL) IVPB 500 mg  Status:  Discontinued     500 mg 100 mL/hr over 60 Minutes Intravenous Every 8 hours 11/21/18 0526 11/21/18 0535   11/21/18 0530  vancomycin (VANCOCIN) IVPB 1000 mg/200 mL premix  Status:  Discontinued     1,000 mg 200 mL/hr over 60 Minutes Intravenous  Once 11/21/18 0526 11/21/18 0528   11/21/18 0530  vancomycin (VANCOCIN) 2,000 mg in sodium chloride 0.9 % 500 mL IVPB     2,000 mg 250 mL/hr over 120 Minutes Intravenous  Once 11/21/18 0528 11/21/18 3254       Assessment/Plan Sepsis  Severe AS, s/p TAVR Dementia AAA CHF  Cholecystitis, s/p perc chole drain by IR on 2/9  -cont with drain for now -await cultures -cont abx therapy -will need outpatient drain injection at 4-5 weeks and then follow up with [facility] in 6 weeks to discuss further plans. Discussed with daughter at bedside.  FEN - TFs VTE - heparin ID - Merrem 2/8 -->, Vanc 2/8-->   LOS: 2 days    Henreitta Cea , [facility] Surgery 11/23/2018, 10:18 AM Pager: [phone]

## 2018-11-23 NOTE — Progress Notes (Signed)
Referring Physician(s): Dr Jeanmarie Hubert Dr Clide Cliff  Supervising Physician: Daryll Brod  Patient Status:  [facility] - In-pt  Chief Complaint:  Percutaneous chole drain placed 2/9  Subjective:  Drain intact OP dark bile No response; but does move right arm when I removed pillow from beneath   Allergies: Crestor [rosuvastatin]; Lipitor [atorvastatin]; and Shrimp [shellfish allergy]  Medications: Prior to Admission medications   Medication Sig Start Date End Date Taking? Authorizing Provider  acetaminophen (TYLENOL) 500 MG tablet Take 500 mg by mouth every 6 (six) hours as needed (for pain).    Yes [provider]  Amino Acids-Protein Hydrolys (FEEDING SUPPLEMENT, PRO-STAT SUGAR FREE 64,) LIQD Take 30 mLs by mouth daily.   Yes [provider]  amiodarone (PACERONE) 200 MG tablet Take 0.5 tablets (100 mg total) by mouth daily. 11/16/18  Yes Croitoru, Mihai, MD  aspirin 81 MG chewable tablet Chew 1 tablet (81 mg total) by mouth daily. 07/08/18  Yes Kroeger, Lorelee Cover., PA-C  cholecalciferol (VITAMIN D) 1000 units tablet Take 2,000 Units by mouth at bedtime.   Yes [provider]  clopidogrel (PLAVIX) 75 MG tablet Take 1 tablet (75 mg total) by mouth daily with breakfast. 08/21/18  Yes Eileen Stanford, PA-C  ezetimibe-simvastatin (VYTORIN) 10-20 MG per tablet Take 1 tablet by mouth at bedtime.   Yes [provider]  metoprolol succinate (TOPROL-XL) 25 MG 24 hr tablet Take 1 tablet (25 mg total) by mouth daily. Take with or immediately following a meal. 09/18/18  Yes Nita Sells, MD  Multiple Vitamins-Minerals (PRESERVISION AREDS 2 PO) Take 1 capsule by mouth daily.    Yes [provider]  polyethylene glycol (MIRALAX / GLYCOLAX) packet Take 17 g by mouth every evening. Patient taking differently: Take 1 g by mouth every evening. 1 teaspoon daily 01/21/17  Yes Croitoru, Mihai, MD  triamcinolone (KENALOG) 0.025 % cream Apply 1  application topically 2 (two) times daily. Until resolved   Yes [provider]     Vital Signs: BP (!) 89/59   Pulse 87   Temp 99.5 F (37.5 C)   Resp 17   Ht 5\' 11"  (1.803 m)   Wt 225 lb 12 oz (102.4 kg)   SpO2 98%   BMI 31.49 kg/m   Physical Exam Vitals signs reviewed.  Skin:    General: Skin is warm and dry.     Comments: Skin is clean and dry NT no bleeding OP dark bile 10 cc in bag  Cx; NGTD     Imaging: Ct Abdomen Pelvis Wo Contrast  Result Date: 11/21/2018 CLINICAL DATA:  Sepsis. EXAM: CT ABDOMEN AND PELVIS WITHOUT CONTRAST TECHNIQUE: Multidetector CT imaging of the abdomen and pelvis was performed following the standard protocol without IV contrast. COMPARISON:  CT scan 09/04/2018 FINDINGS: Lower chest: The heart is upper limits normal in size for age. Coronary artery calcifications and evidence of prior TAVR. Small bilateral pleural effusions and bibasilar atelectasis or infiltrates. Hepatobiliary: No focal hepatic lesions are identified without contrast. Numerous gallstones are noted in the gallbladder. Suspect mild gallbladder wall thickening and mild pericholecystic inflammatory changes. Could not exclude acute cholecystitis. No common bile duct dilatation. Pancreas: Moderate atrophy but no mass or acute inflammation. Spleen: Normal size.  No focal lesions. Adrenals/Urinary Tract: The adrenal glands are unremarkable and stable. Bilateral renal cysts but no worrisome renal masses. No renal or obstructing ureteral calculi. There is a Foley catheter in a decompressed bladder. No bladder mass or bladder  calculi. Stomach/Bowel: The stomach contains an NG tube. The duodenum, small bowel and colon are grossly normal. No acute inflammatory process, mass lesion or obstructive findings. Evidence of a prior right hemicolectomy. Sigmoid colon diverticulosis without findings for acute diverticulitis. Small amount of fluid noted in the right pericolic gutter and in the anterior  pararenal space. Vascular/Lymphatic: Advanced atherosclerotic calcifications involving the aorta. 4.6 x 4.0 cm infrarenal abdominal aortic aneurysm with a left-sided saccular component. This measured a maximum of 4.5 cm on the prior study. Reproductive: The prostate gland and seminal vesicles are grossly normal. Other: Small amount of free fluid in the pelvis but no pelvic mass or adenopathy. No inguinal mass or adenopathy. Musculoskeletal: No significant bony findings. Stable compression fracture of L1 with a vertebral plana appearance. IMPRESSION: 1. Cholelithiasis with findings suspicious for acute cholecystitis. Recommend correlation with right upper quadrant ultrasound examination. 2. Stable infrarenal abdominal aortic aneurysm with a left-sided saccular component. 3. Small bilateral pleural effusions and bibasilar atelectasis or infiltrates. Electronically Signed   By: Marijo Sanes M.D.   On: 11/21/2018 08:57   US Abdomen Complete  Result Date: 11/21/2018 CLINICAL DATA:  Acute cholecystitis an urinary tract infection. Patient is intubated. Elevated total bilirubin. EXAM: ABDOMEN ULTRASOUND COMPLETE COMPARISON:  CT on 11/21/2018 a prior CT exams including 12/07/2013 FINDINGS: Gallbladder: Gallbladder wall is irregular and thickened, measuring 7 millimeters. Dependent stones are present, measuring on the order of 1.7 centimeters or smaller. Common bile duct: Diameter: 5.4 millimeter Liver: The visualized portion of the liver is unremarkable. LEFT hepatic lobe is not well seen because of bowel gas. Portal vein is patent on color Doppler imaging with normal direction of blood flow towards the liver. IVC: Visualized portion normal. Pancreas: Obscured by bowel gas. Spleen: Obscured by bowel gas. Right Kidney: Length: 11.0 centimeters. 4.6 x 4.5 x 5.2 centimeter cyst containing internal septation. On CT exam 2015, this lesion measured 3.2 x 3.2 centimeters. No hydronephrosis. Left Kidney: Length: 12.3  centimeters. Simple cyst is 3.5 x 2.4 x 2.9 centimeters. No hydronephrosis. Abdominal aorta: Not visualized on the level of the mid abdominal aorta to the bifurcation segment 1 bowel gas. No visualized aneurysm. Other findings: Study quality is degraded by patient body habitus and intubation. Note is made of a small RIGHT pleural effusion. IMPRESSION: 1. Irregular, thickened gallbladder wall and multiple gallstones. Presence or absence of sonographic Murphy's can not be assessed due to the patient's intubated status. Findings are suspicious for acute cholecystitis however. 2. RIGHT pleural effusion. 3. Complex cystic lesion within the UPPER pole the RIGHT kidney. Lesion has progressed since prior studies. If possible and needed, consider further characterization with outpatient contrast-enhanced CT or MRI with intravenous contrast. Electronically Signed   By: Nolon Nations M.D.   On: 11/21/2018 12:46   Ir Perc Cholecystostomy  Result Date: 11/23/2018 INDICATION: 85 year old male with a history of cholecystitis EXAM: CHOLECYSTOSTOMY MEDICATIONS: In-hospital antibiotics ANESTHESIA/SEDATION: Moderate (conscious) sedation was employed during this procedure. A total of Versed 2.0 mg and Fentanyl 0 mcg was administered intravenously. Moderate Sedation Time: 0 minutes. The patient's level of consciousness and vital signs were monitored continuously by radiology nursing throughout the procedure under my direct supervision. FLUOROSCOPY TIME:  Fluoroscopy Time: 0 minutes 24 seconds (42.6 mGy). COMPLICATIONS: None PROCEDURE: Informed written consent was obtained from the patient and the patient's family after a thorough discussion of the procedural risks, benefits and alternatives. All questions were addressed. Maximal Sterile Barrier Technique was utilized including caps, mask, sterile gowns, sterile gloves,  sterile drape, hand hygiene and skin antiseptic. A timeout was performed prior to the initiation of the  procedure. Ultrasound survey of the right upper quadrant was performed for planning purposes. Once the patient is prepped and draped in the usual sterile fashion, the skin and subcutaneous tissues overlying the gallbladder were generously infiltrated 1% lidocaine for local anesthesia. A coaxial needle was advanced under ultrasound guidance through the skin subcutaneous tissues and a small segment of liver into the gallbladder lumen. With removal of the stylet, spontaneous dark bile drainage occurred. Using modified Seldinger technique, a 10 French drain was placed into the gallbladder fossa, with aspiration of the sample for the lab. Contrast injection confirmed position of the tube within the gallbladder lumen. Drainage catheter was attached to gravity drain with a suture retention placed. Patient tolerated the procedure well and remained hemodynamically stable throughout. No complications were encountered and no significant blood loss encountered. IMPRESSION: Status post image guided percutaneous cholecystostomy. Signed, Dulcy Fanny. Dellia Nims, RPVI [facility] Radiology Electronically Signed   By: Corrie Mckusick D.O.   On: 11/23/2018 08:16   Dg Chest Port 1 View  Result Date: 11/23/2018 CLINICAL DATA:  Acute respiratory failure EXAM: PORTABLE CHEST 1 VIEW COMPARISON:  Two days ago FINDINGS: Endotracheal tube tip between the clavicular heads and carina. The orogastric tube reaches the stomach. Low volume chest with streaky density at the bases. There may be trace pleural fluid. No Kerley lines or pneumothorax. Cardiomegaly. Transcatheter aortic valve replacement IMPRESSION: Stable hardware positioning, low lung volumes, and presumed atelectasis. Electronically Signed   By: Monte Fantasia M.D.   On: 11/23/2018 06:53   Dg Chest Portable 1 View  Result Date: 11/21/2018 CLINICAL DATA:  Intubation. EXAM: PORTABLE CHEST 1 VIEW COMPARISON:  Earlier film, same date.  FINDINGS: The endotracheal tube is right at the carina and should be retracted approximately 3 cm. The NG tube is coursing down the esophagus and into the stomach. Stable cardiac enlargement. Bibasilar atelectasis but no definite infiltrates or effusions. IMPRESSION: The endotracheal tube is right at the carina and should be retracted at least 3 cm. Bibasilar atelectasis but no edema or infiltrates Electronically Signed   By: Marijo Sanes M.D.   On: 11/21/2018 07:40   Dg Chest Port 1 View  Result Date: 11/21/2018 CLINICAL DATA:  Sepsis. EXAM: PORTABLE CHEST 1 VIEW COMPARISON:  09/16/2018 FINDINGS: The heart is enlarged but stable. Marked tortuosity and calcification of the thoracic aorta. Bibasilar atelectasis. No definite pleural effusions. No obvious infiltrates. IMPRESSION: Cardiac enlargement and marked tortuosity and calcification of the thoracic aorta. No definite infiltrates, effusions or edema. Streaky basilar atelectasis. Electronically Signed   By: Marijo Sanes M.D.   On: 11/21/2018 06:21    Labs:  CBC: Recent Labs    11/21/18 0521  11/21/18 0851 11/21/18 1035 11/22/18 0329 11/23/18 0355  WBC 14.1*  --  13.7*  --  7.7 6.4  HGB 12.3*   < > 11.4* 10.2* 10.0* 10.3*  HCT 36.3*   < > 33.5* 30.0* 30.4* 31.0*  PLT 163  --  127*  --  107* 105*   < > = values in this interval not displayed.    COAGS: Recent Labs    08/07/18 1411 08/13/18 0409 11/21/18 0521 11/21/18 0851 11/22/18 1229  INR 1.25 1.09 1.20 1.33 1.39  APTT 34 37*  --  34  --     BMP: Recent Labs    11/21/18 0851 11/21/18 1035 11/22/18 0329  11/22/18 1229 11/23/18 0355  NA 138 138 140 139 141  K 3.1* 3.4* 3.3* 3.4* 3.1*  CL 107  --  113* 113* 113*  CO2 18*  --  19* 20* 20*  GLUCOSE 188*  --  153* 181* 147*  BUN 20  --  23 24* 23  CALCIUM 7.8*  --  7.6* 7.7* 7.4*  CREATININE 1.09  --  0.89 0.89 0.89  GFRNONAA 60*  --  >60 >60 >60  GFRAA >60  --  >60 >60 >60    LIVER FUNCTION TESTS: Recent Labs     11/21/18 0521 11/21/18 0851 11/22/18 1229 11/23/18 0355  BILITOT 1.3* 1.4* 0.9 0.8  AST 23 25 36 46*  ALT 21 21 32 41  ALKPHOS 91 87 75 79  PROT 6.5 5.8* 5.3* 5.3*  ALBUMIN 2.2* 2.0* 1.6* 1.7*    Assessment and Plan:  Perc chole drain intact OP bile Wbc down Drain to remain 6 weeks Will follow for now   Electronically Signed: Lavonia Drafts, PA-C 11/23/2018, 9:07 AM   I spent a total of 15 Minutes at the the patient's bedside AND on the patient's hospital floor or unit, greater than 50% of which was counseling/coordinating care for perc chole drain

## 2018-11-23 NOTE — Progress Notes (Addendum)
Pharmacy Antibiotic Note  William [name] is a 85 y.o. male admitted on 11/21/2018 with sepsis 2/2 Enterobacter bacteremia and acute cholecystitis, now s/p per biliary drain on 2/9 with fluid culture growing GNRs and Enterococcus faecium. Patient is currently on vancomycin and meropenem. Given propensity for E. faecium to be vancomycin resistant and patient's history of MDR infection, pharmacy has been consulted for daptomycin dosing pending sensitivities. Scr 0.89, estimated CrCl ~69 mL/min. TBW 102.4 kg, AdjBW ~86 kg.  Plan: Discontinue vancomycin Start daptomycin 700mg  (~8mg /kg AdjBW) IV q24h Continue meropenem 1g IV q8h F/u C&S, de-escalation, LOT, renal function  Height: 5\' 11"  (180.3 cm) Weight: 225 lb 12 oz (102.4 kg) IBW/kg (Calculated) : 75.3  Temp (24hrs), Avg:99.6 F (37.6 C), Min:99.1 F (37.3 C), Max:100.6 F (38.1 C)  Recent Labs  Lab 11/21/18 0521 11/21/18 0522 11/21/18 0851 11/21/18 1122 11/22/18 0329 11/22/18 1229 11/23/18 0355  WBC 14.1*  --  13.7*  --  7.7  --  6.4  CREATININE 1.04  --  1.09  --  0.89 0.89 0.89  LATICACIDVEN  --  2.3*  --  2.0*  --   --  1.2    Estimated Creatinine Clearance: 68.5 mL/min (by C-G formula based on SCr of 0.89 mg/dL).    Allergies  Allergen Reactions  . Crestor [Rosuvastatin] Other (See Comments)    Hurts muscles  . Lipitor [Atorvastatin] Other (See Comments)    Hurts stomach  . Shrimp [Shellfish Allergy] Other (See Comments)    On MAR   Antimicrobials this admission: 2/7 vanc >> 2/10 2/7 meropenem >> 2/10 daptomycin >>   Microbiology results: 2/8 MRSA PCR: neg 2/8 BCx x4: 4/4 Enterobacter cloacae  2/8 RVP: neg  2/8 Ucx: >100k yeast  2/8 TA: rare GPC  2/9 bile cx: moderate GNR, E. Faecium  Thank you for allowing pharmacy to be a part of this patient's care.  Mila Merry Gerarda Fraction, PharmD, [place] PGY2 Infectious Diseases Pharmacy Resident Phone: [phone] 11/23/2018 1:36 PM

## 2018-11-23 NOTE — Progress Notes (Signed)
Kary Kos, NP notified of intermittent low systolic but appropriate MAP pressures.   Donnetta Simpers, RN

## 2018-11-23 NOTE — Progress Notes (Signed)
eLink Physician-Brief Progress Note Patient Name: William [name] DOB: [birth year] MRN: [id number]   Date of Service  11/23/2018  HPI/Events of Note  ABG on 40%/PRVC 18/TV 600/P 5 = 7.46/29/117.  eICU Interventions  Will order: 1. Decrease PRVC rate to 14. 2. Repeat ABG at 7:15 AM.      Intervention Category Major Interventions: Acid-Base disturbance - evaluation and management;Respiratory failure - evaluation and management  Sommer,Steven Cornelia Copa 11/23/2018, 4:51 AM

## 2018-11-23 NOTE — Progress Notes (Signed)
NAME:  William [name], MRN:  [id number], DOB:  03/24/1928, LOS: 2 ADMISSION DATE:  11/21/2018,  REFERRING MD: edp, CHIEF COMPLAINT:  sepsis  Brief History   85 year old male nursing home resident who has extensive past medical history as well documented below who presents with shakes, chills, sweats, vomiting.  Intubated for airway protection and admitted to the ICU Being treated for severe sepsis with gram-negative rod bacteremia from cholecystitis versus urosepsis  Past Medical History  Recent history of bacteremia with Klebsiella ESBL requiring peripheral surgical venous catheter in 6 weeks of anticoagulant therapy Tvar January 2020 Ischemic cardiomyopathy History of AICD placement with removal secondary to bacteremia AAA repair Hypertension Prostate cancer Ventricular tachycardia  Significant Hospital Events   11/21/2018 admitted to the hospital for respiratory failure  Consults:  Surgery, interventional radiology  Procedures:  OETT 2/8 > Right radial A-line 2/8 >  Significant Diagnostic Tests:  CT abdomen pelvis 2/8- cholelithiasis with findings suspicious for acute cholecystitis, stable infrarenal aortic aneurysm, small bilateral effusions with atelectasis  Right upper quadrant ultrasound 2/8-irregular thickened gallbladder wall with multiple gallstones.  Suspicious for acute cholecystitis, right pleural effusion.  Micro Data:  11/21/2018 urine culture 11/21/2018 respiratory culture 11/21/2018 blood cultures x2> Enterobacter colacae 11/21/18 flu a/b>> negative 11/21/18 rvp>> negative  Antimicrobials:  November 21 2018 meropenem>> February 2020 vancomycin>>2/10 dapto 2/10>>>  Interim history/subjective:  He appears comfortable.  Currently on spontaneous breathing trial Objective   Blood pressure 98/61, pulse 83, temperature 99.5 F (37.5 C), resp. rate 17, height 5\' 11"  (1.803 m), weight 102.4 kg, SpO2 100 %.    Vent Mode: CPAP;PSV FiO2 (%):  [30 %-100 %] 30 % Set  Rate:  [18 bmp] 18 bmp Vt Set:  [600 mL] 600 mL PEEP:  [5 cmH20] 5 cmH20 Pressure Support:  [10 cmH20] 10 cmH20 Plateau Pressure:  [10 cmH20-18 cmH20] 14 cmH20   Intake/Output Summary (Last 24 hours) at 11/23/2018 1323 Last data filed at 11/23/2018 1200 Gross per 24 hour  Intake 4304.99 ml  Output 880 ml  Net 3424.99 ml   Filed Weights   11/21/18 0512 11/22/18 0500 11/23/18 0340  Weight: 95 kg 95.3 kg 102.4 kg    Examination: General: 85 year old male.  Sedated on fentanyl infusion currently on spontaneous breathing trial HEENT normocephalic atraumatic no jugular venous distention orally intubated Pulmonary: Diminished bases no accessory use Cardiac: Regular rate and rhythm  Abdomen: Soft nontender percutaneous drain with bilious fluid Neuro: Awake, attempts to interact.  Generalized weakness Extremities: Warm dry dependent edema  Resolved Hospital Problem list     Assessment & Plan:  Vent dependent respiratory failure secondary to sepsis -abg reviewed Portable chest x-ray personally reviewed endotracheal tube in satisfactory position right basilar atelectasis otherwise no significant change Plan Continue daily spontaneous weaning trial with assessment for extubation PAD protocol RASS goal 0 Pulmonary hygiene VAP bundle  Severe sepsis, Enterobacter bacteremia secondary to acute cholecystitis  Recent history of ESBL Klebsiella bacteremia require PICC line and IV antimicrobial therapy.  S/p perc drain per IR on 2/9-fluid growing Enterococcus faecium -pct trending down  Plan Cont abx day 4 vanc and meropenem; await pending cultures, but changing vanc to dapto  Out-pt drain injection 4-5 weeks and f/u w/ [facility] after that  Acute metabolic encephalopathy, superimposed on underlying low-grade dementia Plan Discontinue fentanyl drip Consider PRN Haldol Supportive care Treat infection PRN fentanyl for pain Avoid benzodiazepines  Extensive cardiac history and includes TAVR  January 2020, AICD which was removed due to bacteremia.  History of ventricular tachycardia required shock when AICD was in place.  Severe valvular stenosis.  Nonischemic cardiomyopathy  -trop minimal  Plan F/u echo Cont tele   Fluid & electrolyte imbalance: hypokalemia, mild hypophosphatemia, NAG metabolic acidosis  Plan Replace and recheck   Hyperglycemia Plan ssi   Best practice:  Diet: N.p.o. Pain/Anxiety/Delirium protocol (if indicated): Fentanyl VAP protocol (if indicated): In place DVT prophylaxis: Heparin GI prophylaxis: Protonix Glucose control: Sliding scale insulin Mobility: None bedrest Code Status: Full code Family Communication: Daughters updated at bedside 2/9 Disposition: ICU Remains critically ill but he is improving.  Requires critical care services for ventilation weaning  William [name] ACNP-BC [place] Pager # [phone] OR # [phone] if no answer

## 2018-11-24 DIAGNOSIS — Z888 Allergy status to other drugs, medicaments and biological substances status: Secondary | ICD-10-CM

## 2018-11-24 DIAGNOSIS — F0390 Unspecified dementia without behavioral disturbance: Secondary | ICD-10-CM

## 2018-11-24 DIAGNOSIS — Z978 Presence of other specified devices: Secondary | ICD-10-CM

## 2018-11-24 DIAGNOSIS — A498 Other bacterial infections of unspecified site: Secondary | ICD-10-CM

## 2018-11-24 DIAGNOSIS — G4730 Sleep apnea, unspecified: Secondary | ICD-10-CM

## 2018-11-24 DIAGNOSIS — Z8546 Personal history of malignant neoplasm of prostate: Secondary | ICD-10-CM

## 2018-11-24 DIAGNOSIS — Z8619 Personal history of other infectious and parasitic diseases: Secondary | ICD-10-CM

## 2018-11-24 DIAGNOSIS — B9689 Other specified bacterial agents as the cause of diseases classified elsewhere: Secondary | ICD-10-CM

## 2018-11-24 DIAGNOSIS — Z91013 Allergy to seafood: Secondary | ICD-10-CM

## 2018-11-24 DIAGNOSIS — K819 Cholecystitis, unspecified: Secondary | ICD-10-CM

## 2018-11-24 DIAGNOSIS — I5022 Chronic systolic (congestive) heart failure: Secondary | ICD-10-CM

## 2018-11-24 DIAGNOSIS — B952 Enterococcus as the cause of diseases classified elsewhere: Secondary | ICD-10-CM

## 2018-11-24 DIAGNOSIS — Z952 Presence of prosthetic heart valve: Secondary | ICD-10-CM

## 2018-11-24 DIAGNOSIS — R7881 Bacteremia: Secondary | ICD-10-CM

## 2018-11-24 LAB — CULTURE, RESPIRATORY W GRAM STAIN
Culture: NORMAL
Special Requests: NORMAL

## 2018-11-24 LAB — GLUCOSE, CAPILLARY
Glucose-Capillary: 102 mg/dL — ABNORMAL HIGH (ref 70–99)
Glucose-Capillary: 102 mg/dL — ABNORMAL HIGH (ref 70–99)
Glucose-Capillary: 117 mg/dL — ABNORMAL HIGH (ref 70–99)
Glucose-Capillary: 116 mg/dL — ABNORMAL HIGH (ref 70–99)
Glucose-Capillary: 128 mg/dL — ABNORMAL HIGH (ref 70–99)

## 2018-11-24 LAB — CBC
HCT: 28.5 % — ABNORMAL LOW (ref 39.0–52.0)
Hemoglobin: 9.1 g/dL — ABNORMAL LOW (ref 13.0–17.0)
MCH: 31.1 pg (ref 26.0–34.0)
MCHC: 31.9 g/dL (ref 30.0–36.0)
MCV: 97.3 fL (ref 80.0–100.0)
Platelets: 102 10*3/uL — ABNORMAL LOW (ref 150–400)
RBC: 2.93 MIL/uL — AB (ref 4.22–5.81)
RDW: 14.5 % (ref 11.5–15.5)
WBC: 4.9 10*3/uL (ref 4.0–10.5)
nRBC: 0 % (ref 0.0–0.2)

## 2018-11-24 LAB — COMPREHENSIVE METABOLIC PANEL
ALK PHOS: 76 U/L (ref 38–126)
ALT: 48 U/L — AB (ref 0–44)
AST: 54 U/L — ABNORMAL HIGH (ref 15–41)
Albumin: 1.5 g/dL — ABNORMAL LOW (ref 3.5–5.0)
Anion gap: 7 (ref 5–15)
BUN: 17 mg/dL (ref 8–23)
CALCIUM: 7.5 mg/dL — AB (ref 8.9–10.3)
CO2: 21 mmol/L — ABNORMAL LOW (ref 22–32)
Chloride: 114 mmol/L — ABNORMAL HIGH (ref 98–111)
Creatinine, Ser: 0.82 mg/dL (ref 0.61–1.24)
GFR calc Af Amer: >60 mL/min (ref >60)
GFR calc non Af Amer: >60 mL/min (ref >60)
GLUCOSE: 113 mg/dL — AB (ref 70–99)
Potassium: 2.9 mmol/L — ABNORMAL LOW (ref 3.5–5.1)
Sodium: 142 mmol/L (ref 135–145)
Total Bilirubin: 0.7 mg/dL (ref 0.3–1.2)
Total Protein: 4.8 g/dL — ABNORMAL LOW (ref 6.5–8.1)

## 2018-11-24 MED ORDER — POTASSIUM CHLORIDE CRYS ER 20 MEQ PO TBCR
40.0000 meq | EXTENDED_RELEASE_TABLET | ORAL | Status: AC
  Administered 2018-11-24 (×2): 40 meq via ORAL
  Filled 2018-11-24 (×2): qty 2

## 2018-11-24 MED ORDER — RESOURCE THICKENUP CLEAR PO POWD
ORAL | Status: DC | PRN
  Filled 2018-11-24: qty 125

## 2018-11-24 MED ORDER — INSULIN ASPART 100 UNIT/ML ~~LOC~~ SOLN
0.0000 [IU] | Freq: Three times a day (TID) | SUBCUTANEOUS | Status: DC
  Administered 2018-11-24: 1 [IU] via SUBCUTANEOUS
  Administered 2018-11-25 (×3): 2 [IU] via SUBCUTANEOUS
  Administered 2018-11-26 (×2): 1 [IU] via SUBCUTANEOUS
  Administered 2018-11-27: 2 [IU] via SUBCUTANEOUS
  Administered 2018-11-27: 1 [IU] via SUBCUTANEOUS

## 2018-11-24 MED ORDER — NON FORMULARY: 5.0000 mg | Freq: Every day | Status: DC

## 2018-11-24 MED ORDER — MELATONIN 3 MG PO TABS
6.0000 mg | ORAL_TABLET | Freq: Every evening | ORAL | Status: DC | PRN
  Filled 2018-11-24: qty 2

## 2018-11-24 MED ORDER — PANTOPRAZOLE SODIUM 40 MG PO TBEC: 40.0000 mg | DELAYED_RELEASE_TABLET | Freq: Every day | ORAL | Status: DC

## 2018-11-24 MED ORDER — PIPERACILLIN-TAZOBACTAM 3.375 G IVPB
3.3750 g | Freq: Three times a day (TID) | INTRAVENOUS | Status: DC
  Administered 2018-11-24 – 2018-11-27 (×9): 3.375 g via INTRAVENOUS
  Filled 2018-11-24 (×10): qty 50

## 2018-11-24 NOTE — Progress Notes (Signed)
Patient ID: William [name], male   DOB: [birth year], 85 y.o.   MRN: [id number]       Subjective: Patient extubated and seems a bit confused.  Daughter at bedside answers questions for him.    Objective: Vital signs in last 24 hours: Temp:  [98.2 F (36.8 C)-100 F (37.8 C)] 99.3 F (37.4 C) (02/11 1000) Pulse Rate:  [70-97] 79 (02/11 1100) Resp:  [16-30] 26 (02/11 1100) BP: (87-116)/(58-86) 98/66 (02/11 1100) SpO2:  [93 %-100 %] 99 % (02/11 1100) FiO2 (%):  [30 %] 30 % (02/10 1207) Last BM Date: (PTA)  Intake/Output from previous day: 02/10 0701 - 02/11 0700 In: 3577.3 [I.V.:2286; IV Piggyback:1291.3] Out: 2140 [Urine:2090; Drains:50] Intake/Output this shift: Total I/O In: 97.6 [I.V.:97.6] Out: 450 [Urine:450]  PE: Abd: soft, mildly tender in the RUQ as expected after drain placement.  Drain with brown bilious output.    Lab Results:  Recent Labs    11/23/18 0355 11/23/18 0751 11/24/18 0423  WBC 6.4  --  4.9  HGB 10.3* 15.0 9.1*  HCT 31.0* 44.0 28.5*  PLT 105*  --  102*   BMET Recent Labs    11/23/18 0355 11/23/18 0751 11/24/18 0423  NA 141 142 142  K 3.1* 3.4* 2.9*  CL 113*  --  114*  CO2 20*  --  21*  GLUCOSE 147*  --  113*  BUN 23  --  17  CREATININE 0.89  --  0.82  CALCIUM 7.4*  --  7.5*   PT/INR Recent Labs    11/22/18 1229  LABPROT 16.9*  INR 1.39   CMP     Component Value Date/Time   NA 142 11/24/2018 0423   NA 137 10/05/2018 0500   K 2.9 (L) 11/24/2018 0423   CL 114 (H) 11/24/2018 0423   CO2 21 (L) 11/24/2018 0423   GLUCOSE 113 (H) 11/24/2018 0423   BUN 17 11/24/2018 0423   BUN 18 10/05/2018 0500   CREATININE 0.82 11/24/2018 0423   CREATININE 1.12 (H) 02/05/2017 1103   CALCIUM 7.5 (L) 11/24/2018 0423   PROT 4.8 (L) 11/24/2018 0423   ALBUMIN 1.5 (L) 11/24/2018 0423   AST 54 (H) 11/24/2018 0423   ALT 48 (H) 11/24/2018 0423   ALKPHOS 76 11/24/2018 0423   BILITOT 0.7 11/24/2018 0423   GFRNONAA >60 11/24/2018 0423   GFRAA >60  11/24/2018 0423   Lipase     Component Value Date/Time   LIPASE 18 11/22/2018 1229       Studies/Results: Ir Perc Cholecystostomy  Result Date: 11/23/2018 INDICATION: 85 year old male with a history of cholecystitis EXAM: CHOLECYSTOSTOMY MEDICATIONS: In-hospital antibiotics ANESTHESIA/SEDATION: Moderate (conscious) sedation was employed during this procedure. A total of Versed 2.0 mg and Fentanyl 0 mcg was administered intravenously. Moderate Sedation Time: 0 minutes. The patient's level of consciousness and vital signs were monitored continuously by radiology nursing throughout the procedure under my direct supervision. FLUOROSCOPY TIME:  Fluoroscopy Time: 0 minutes 24 seconds (42.6 mGy). COMPLICATIONS: None PROCEDURE: Informed written consent was obtained from the patient and the patient's family after a thorough discussion of the procedural risks, benefits and alternatives. All questions were addressed. Maximal Sterile Barrier Technique was utilized including caps, mask, sterile gowns, sterile gloves, sterile drape, hand hygiene and skin antiseptic. A timeout was performed prior to the initiation of the procedure. Ultrasound survey of the right upper quadrant was performed for planning purposes. Once the patient is prepped and draped in the usual sterile fashion, the skin  and subcutaneous tissues overlying the gallbladder were generously infiltrated 1% lidocaine for local anesthesia. A coaxial needle was advanced under ultrasound guidance through the skin subcutaneous tissues and a small segment of liver into the gallbladder lumen. With removal of the stylet, spontaneous dark bile drainage occurred. Using modified Seldinger technique, a 10 French drain was placed into the gallbladder fossa, with aspiration of the sample for the lab. Contrast injection confirmed position of the tube within the gallbladder lumen. Drainage catheter was attached to gravity drain with a suture retention placed. Patient  tolerated the procedure well and remained hemodynamically stable throughout. No complications were encountered and no significant blood loss encountered. IMPRESSION: Status post image guided percutaneous cholecystostomy. Signed, Dulcy Fanny. Dellia Nims, RPVI [facility] Radiology Electronically Signed   By: Corrie Mckusick D.O.   On: 11/23/2018 08:16   Dg Chest Port 1 View  Result Date: 11/23/2018 CLINICAL DATA:  Acute respiratory failure EXAM: PORTABLE CHEST 1 VIEW COMPARISON:  Two days ago FINDINGS: Endotracheal tube tip between the clavicular heads and carina. The orogastric tube reaches the stomach. Low volume chest with streaky density at the bases. There may be trace pleural fluid. No Kerley lines or pneumothorax. Cardiomegaly. Transcatheter aortic valve replacement IMPRESSION: Stable hardware positioning, low lung volumes, and presumed atelectasis. Electronically Signed   By: Monte Fantasia M.D.   On: 11/23/2018 06:53    Anti-infectives: Anti-infectives (From admission, onward)   Start     Dose/Rate Route Frequency Ordered Stop   11/23/18 2000  DAPTOmycin (CUBICIN) 700 mg in sodium chloride 0.9 % IVPB     700 mg 228 mL/hr over 30 Minutes Intravenous Daily 11/23/18 1353     11/22/18 0800  vancomycin (VANCOCIN) 1,500 mg in sodium chloride 0.9 % 500 mL IVPB  Status:  Discontinued     1,500 mg 250 mL/hr over 120 Minutes Intravenous Every 24 hours 11/21/18 0645 11/23/18 1334   11/21/18 1400  meropenem (MERREM) 1 g in sodium chloride 0.9 % 100 mL IVPB     1 g 200 mL/hr over 30 Minutes Intravenous Every 8 hours 11/21/18 0645     11/21/18 0545  meropenem (MERREM) 1 g in sodium chloride 0.9 % 100 mL IVPB     1 g 200 mL/hr over 30 Minutes Intravenous STAT 11/21/18 0541 11/21/18 0648   11/21/18 0530  ceFEPIme (MAXIPIME) 2 g in sodium chloride 0.9 % 100 mL IVPB  Status:  Discontinued     2 g 200 mL/hr over 30 Minutes Intravenous  Once 11/21/18 0526  11/21/18 0535   11/21/18 0530  metroNIDAZOLE (FLAGYL) IVPB 500 mg  Status:  Discontinued     500 mg 100 mL/hr over 60 Minutes Intravenous Every 8 hours 11/21/18 0526 11/21/18 0535   11/21/18 0530  vancomycin (VANCOCIN) IVPB 1000 mg/200 mL premix  Status:  Discontinued     1,000 mg 200 mL/hr over 60 Minutes Intravenous  Once 11/21/18 0526 11/21/18 0528   11/21/18 0530  vancomycin (VANCOCIN) 2,000 mg in sodium chloride 0.9 % 500 mL IVPB     2,000 mg 250 mL/hr over 120 Minutes Intravenous  Once 11/21/18 0528 11/21/18 1610       Assessment/Plan Sepsis  Severe AS, s/p TAVR Dementia AAA CHF  Cholecystitis, s/p perc chole drain by IR on 2/9  -cont with drain for now -await cultures, showing enterococcus -cont abx therapy -will need outpatient drain injection at 4-5 weeks and then follow up with [facility] in 6 weeks to  discuss further plans. Discussed with daughter at bedside. -may advance diet after swallow eval  FEN - NPO, awaiting swallow eval VTE - heparin ID - Merrem 2/8 -->, Vanc 2/8-->   LOS: 3 days    Henreitta Cea , [facility] 11/24/2018, 11:30 AM Pager: [phone]

## 2018-11-24 NOTE — Consult Note (Signed)
William [name] for Infectious Disease    Date of Admission:  11/21/2018     Total days of antibiotics 5               Reason for Consult: Bacteremia / Cholecysitis  Referring Provider: CCM  Primary Care Provider: Haywood Pao, MD   Assessment/Plan:  [name] is an 85 y/o male with previous history of TAVR and ESBL producing Klebsiella admitted with altered mental status and fever and now with cholecysitis s/p IR drain placement with cultures showing Enterobacter asburiae/Enterococcus faecium complicated with Enterobacter cloacie bacteremia. TTE without evidence of endocarditis and will need TEE as there is concern given previous and recent TAVR. Will change antimicrobial therapy to Daptomycin and piperacillin-tazobactam pending culture results. Duration of therapy to be determined.   1. Discontinue meropenem. 2. Start piperacillin-tazobactam and continue Daptomycin per pharmacy.  3. Monitor cultures and and fever curve. 4. ID will continue to follow.    Principal Problem:   Bacteremia due to Enterobacter species Active Problems:   Acute respiratory failure (HCC)   Sepsis ([place])   Shock ([place])   Pressure injury of skin   Enterococcus faecalis infection   . amiodarone  100 mg Oral Daily  . aspirin  81 mg Oral Daily  . chlorhexidine gluconate (MEDLINE KIT)  15 mL Mouth Rinse BID  . heparin  5,000 Units Subcutaneous Q8H  . insulin aspart  0-9 Units Subcutaneous TID AC & HS  . sodium chloride flush  10-40 mL Intracatheter Q12H  . sodium chloride flush  5 mL Intracatheter Q8H     HPI: William [name] is a 85 y.o. 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  William [name] was previously been treated for ESBL producing Klebsiella bacteremia from ICD  leads following his TAVR in November 2019. He had no endocarditis at the time. He was treated with 6 weeks of Invanz with end date of 10/27/18.   Intubated for airway protection in the ED with presumed sepsis as he was hypotensive. Chest x-ray with cardiac enlargement and calcification of the thoracic aorta. CT of the abdomen with cholelithiasis suspicious for acute cholecystitis with findings also on ultrasound. IR placed a percutaneous drain.   William [name] has since been extubated. He has been afebrile over the past 48 hours with stable WBC count of 4.9. Blood cultures obtained on admission positive for Enterobacter cloacae. Repeat blood cultures on 11/21/18 have been without growth to date. Respiratory culture with normal flora. IR specimen gram stain with gram negative rods, gram positive cocci, and gram positive rods. Cultures positive for Enterobacter asburiae and enterococcus faecium. ESBL has been negative to this point. Susceptibilities for enterococcus pending. He is currently on Day 5 of antimicrobial therapy with the current regiment of Daptomycin and meropenem.    Review of Systems: Review of Systems  Unable to perform ROS: Dementia     Past Medical History:  Diagnosis Date  . AAA (abdominal aortic aneurysm) ([place])    7/13 3.8cm  . Arthritis    "joints" (01/11/2014)  . Asthma    "seasonal; some foods"   . Chronic systolic CHF (congestive heart failure) ([place])   . Dementia ([place])   . HLD (hyperlipidemia)   . HOH (hard of hearing)   . Lumbar vertebral fracture (HCC)  L1- 04/11/2014   . Prostate cancer ([place])   . S/P ICD (internal cardiac defibrillator) procedure, 01/11/14 removal of ERI gen and placement of Medtronic Evera XT VR & NEW Right ventricular lead Medtronic 01/12/2014  . S/P TAVR (transcatheter aortic valve replacement)    Edwards Sapien 3 THV (size 29 mm, model # [id number], serial # [id number])  . Severe aortic stenosis   . Sleep apnea    "lost 60# & don't have it anymore"  (01/11/2014)  . Ventricular tachycardia (HCC)     Social History   Tobacco Use  . Smoking status: Never Smoker  . Smokeless tobacco: Never Used  Substance Use Topics  . Alcohol use: Yes    Alcohol/week: 0.0 standard drinks    Comment: 01/11/2014 "drink 1/2 of a beer twice per month   . Drug use: No    Family History  Problem Relation Age of Onset  . Heart failure Mother        Died of "old age" at 62  . Pneumonia Father     Allergies  Allergen Reactions  . Crestor [Rosuvastatin] Other (See Comments)    Hurts muscles  . Lipitor [Atorvastatin] Other (See Comments)    Hurts stomach  . Shrimp [Shellfish Allergy] Other (See Comments)    On MAR    OBJECTIVE: Blood pressure (!) 88/56, pulse 80, temperature 98.1 F (36.7 C), temperature source Oral, resp. rate (!) 21, height 5' 11" (1.803 m), weight 102.4 kg, SpO2 100 %.  Physical Exam Constitutional:      General: He is not in acute distress.    Appearance: Normal appearance. He is well-developed.  Cardiovascular:     Rate and Rhythm: Normal rate and regular rhythm.     Heart sounds: Normal heart sounds.  Pulmonary:     Effort: Pulmonary effort is normal.     Breath sounds: Normal breath sounds.  Abdominal:     Comments: Drain with bilious output.   Skin:    General: Skin is warm and dry.  Neurological:     Mental Status: He is alert and oriented to person, place, and time.  Psychiatric:        Behavior: Behavior normal.        Thought Content: Thought content normal.        Judgment: Judgment normal.     Lab Results Lab Results  Component Value Date   WBC 4.9 11/24/2018   HGB 9.1 (L) 11/24/2018   HCT 28.5 (L) 11/24/2018   MCV 97.3 11/24/2018   PLT 102 (L) 11/24/2018    Lab Results  Component Value Date   CREATININE 0.82 11/24/2018   BUN 17 11/24/2018   NA 142 11/24/2018   K 2.9 (L) 11/24/2018   CL 114 (H) 11/24/2018   CO2 21 (L) 11/24/2018    Lab Results  Component Value Date   ALT 48 (H)  11/24/2018   AST 54 (H) 11/24/2018   ALKPHOS 76 11/24/2018   BILITOT 0.7 11/24/2018     Microbiology: Recent Results (from the past 240 hour(s))  Culture, blood (Routine x 2)     Status: Abnormal   Collection Time: 11/21/18  5:21 AM  Result Value Ref Range Status   Specimen Description BLOOD RIGHT ANTECUBITAL  Final   Special Requests   Final    BOTTLES DRAWN AEROBIC AND ANAEROBIC Blood Culture adequate volume   Culture  Setup Time   Final    GRAM NEGATIVE RODS IN BOTH AEROBIC AND ANAEROBIC BOTTLES CRITICAL  RESULT CALLED TO, READ BACK BY AND VERIFIED WITH: B MANCHERIL,PHARMD AT 2014 11/21/2018 BY L BENFIELD    Culture (A)  Final    ENTEROBACTER CLOACAE CORRECTED ON 02/10 AT 0920: PREVIOUSLY REPORTED AS ESCHERICHIA COLI CORRECTED RESULTS PREVIOUSLY REPORTED AS: ESCHERICHIA COLI CORRECTED RESULTS CALLED TO: RN Oliver Hum [id number] [phone] MLM Performed at [name], [place] [address] [place], [place] [address]    Report Status 11/23/2018 FINAL  Final   Organism ID, Bacteria ENTEROBACTER CLOACAE  Final      Susceptibility   Enterobacter cloacae - MIC*    AMPICILLIN 16 INTERMEDIATE Intermediate     CEFAZOLIN >=64 RESISTANT Resistant     CEFEPIME <=1 SENSITIVE Sensitive     CEFTAZIDIME <=1 SENSITIVE Sensitive     CEFTRIAXONE <=1 SENSITIVE Sensitive     CIPROFLOXACIN <=0.25 SENSITIVE Sensitive     GENTAMICIN <=1 SENSITIVE Sensitive     IMIPENEM 0.5 SENSITIVE Sensitive     TRIMETH/SULFA <=20 SENSITIVE Sensitive     AMPICILLIN/SULBACTAM 8 SENSITIVE Sensitive     PIP/TAZO <=4 SENSITIVE Sensitive     Extended ESBL NEGATIVE Sensitive     * ENTEROBACTER CLOACAE CORRECTED ON 02/10 AT 0920: PREVIOUSLY REPORTED AS ESCHERICHIA COLI  Blood Culture ID Panel (Reflexed)     Status: Abnormal   Collection Time: 11/21/18  5:21 AM  Result Value Ref Range Status   Enterococcus species NOT DETECTED NOT DETECTED Final   Listeria monocytogenes NOT DETECTED NOT DETECTED Final   Staphylococcus  species NOT DETECTED NOT DETECTED Final   Staphylococcus aureus (BCID) NOT DETECTED NOT DETECTED Final   Streptococcus species NOT DETECTED NOT DETECTED Final   Streptococcus agalactiae NOT DETECTED NOT DETECTED Final   Streptococcus pneumoniae NOT DETECTED NOT DETECTED Final   Streptococcus pyogenes NOT DETECTED NOT DETECTED Final   Acinetobacter baumannii NOT DETECTED NOT DETECTED Final   Enterobacteriaceae species DETECTED (A) NOT DETECTED Final    Comment: Enterobacteriaceae represent a large family of gram-negative bacteria, not a single organism. CRITICAL RESULT CALLED TO, READ BACK BY AND VERIFIED WITH: B MANCHERIL,PHARMD AT 2014 11/21/2018 BY L BENFIELD    Enterobacter cloacae complex DETECTED (A) NOT DETECTED Final    Comment: CRITICAL RESULT CALLED TO, READ BACK BY AND VERIFIED WITH: B MANCHERIL,PHARMD AT 2014 11/21/2018 BY L BENFIELD    Escherichia coli NOT DETECTED NOT DETECTED Final   Klebsiella oxytoca NOT DETECTED NOT DETECTED Final   Klebsiella pneumoniae NOT DETECTED NOT DETECTED Final   Proteus species NOT DETECTED NOT DETECTED Final   Serratia marcescens NOT DETECTED NOT DETECTED Final   Carbapenem resistance NOT DETECTED NOT DETECTED Final   Haemophilus influenzae NOT DETECTED NOT DETECTED Final   Neisseria meningitidis NOT DETECTED NOT DETECTED Final   Pseudomonas aeruginosa NOT DETECTED NOT DETECTED Final   Candida albicans NOT DETECTED NOT DETECTED Final   Candida glabrata NOT DETECTED NOT DETECTED Final   Candida krusei NOT DETECTED NOT DETECTED Final   Candida parapsilosis NOT DETECTED NOT DETECTED Final   Candida tropicalis NOT DETECTED NOT DETECTED Final    Comment: Performed at [facility], [place]. [address] [place], [place] [address]  Culture, blood (Routine x 2)     Status: Abnormal   Collection Time: 11/21/18  5:22 AM  Result Value Ref Range Status   Specimen Description BLOOD BLOOD LEFT FOREARM  Final   Special Requests   Final    BOTTLES DRAWN  AEROBIC AND ANAEROBIC Blood Culture adequate volume   Culture  Setup Time   Final    GRAM NEGATIVE RODS IN BOTH AEROBIC AND ANAEROBIC BOTTLES CRITICAL VALUE NOTED.  VALUE IS CONSISTENT WITH PREVIOUSLY REPORTED AND CALLED VALUE.    Culture (A)  Final    ENTEROBACTER CLOACAE SUSCEPTIBILITIES PERFORMED ON PREVIOUS CULTURE WITHIN THE LAST 5 DAYS. Performed at [facility], [place] [address], [place], [place] [address]    Report Status 11/23/2018 FINAL  Final  Culture, Urine     Status: Abnormal   Collection Time: 11/21/18  8:05 AM  Result Value Ref Range Status   Specimen Description URINE, CATHETERIZED  Final   Special Requests   Final    Normal Performed at [facility], [place] [address], [place], [place] [address]    Culture >=100,000 COLONIES/mL YEAST (A)  Final   Report Status 11/22/2018 FINAL  Final  Culture, blood (routine x 2)     Status: None (Preliminary result)   Collection Time: 11/21/18  9:00 AM  Result Value Ref Range Status   Specimen Description BLOOD RIGHT HAND  Final   Special Requests   Final    BOTTLES DRAWN AEROBIC ONLY Blood Culture results may not be optimal due to an excessive volume of blood received in culture bottles   Culture   Final    NO GROWTH 3 DAYS Performed at [facility], [place] [address] [place], [place] [address]    Report Status PENDING  Incomplete  Culture, blood (routine x 2)     Status: None (Preliminary result)   Collection Time: 11/21/18  9:10 AM  Result Value Ref Range Status   Specimen Description BLOOD RIGHT WRIST  Final   Special Requests   Final    BOTTLES DRAWN AEROBIC ONLY Blood Culture results may not be optimal due to an inadequate volume of blood received in culture bottles   Culture   Final    NO GROWTH 3 DAYS Performed at [facility], [place] [address] [place], [place] [address]    Report Status PENDING  Incomplete  Respiratory Panel by PCR     Status: None   Collection Time: 11/21/18  9:18 AM  Result  Value Ref Range Status   Adenovirus NOT DETECTED NOT DETECTED Final   Coronavirus 229E NOT DETECTED NOT DETECTED Final    Comment: (NOTE) The Coronavirus on the Respiratory Panel, DOES NOT test for the novel  Coronavirus (2019 nCoV)    Coronavirus HKU1 NOT DETECTED NOT DETECTED Final   Coronavirus NL63 NOT DETECTED NOT DETECTED Final   Coronavirus OC43 NOT DETECTED NOT DETECTED Final   Metapneumovirus NOT DETECTED NOT DETECTED Final   Rhinovirus / Enterovirus NOT DETECTED NOT DETECTED Final   Influenza A NOT DETECTED NOT DETECTED Final   Influenza B NOT DETECTED NOT DETECTED Final   Parainfluenza Virus 1 NOT DETECTED NOT DETECTED Final   Parainfluenza Virus 2 NOT DETECTED NOT DETECTED Final   Parainfluenza Virus 3 NOT DETECTED NOT DETECTED Final   Parainfluenza Virus 4 NOT DETECTED NOT DETECTED Final   Respiratory Syncytial Virus NOT DETECTED NOT DETECTED Final   Bordetella pertussis NOT DETECTED NOT DETECTED Final   Chlamydophila pneumoniae NOT DETECTED NOT DETECTED Final   Mycoplasma pneumoniae NOT DETECTED NOT DETECTED Final    Comment: Performed at [facility], [address] [place], [place] [address]  MRSA PCR Screening     Status: None   Collection Time: 11/21/18  9:18 AM  Result Value Ref Range Status  MRSA by PCR NEGATIVE NEGATIVE Final    Comment:        The GeneXpert MRSA Assay (FDA approved for NASAL specimens only), is one component of a comprehensive MRSA colonization surveillance program. It is not intended to diagnose MRSA infection nor to guide or monitor treatment for MRSA infections. Performed at [facility], [place] [address], [place], [place] [address]   Culture, respiratory (non-expectorated)     Status: None   Collection Time: 11/21/18  4:42 PM  Result Value Ref Range Status   Specimen Description TRACHEAL ASPIRATE  Final   Special Requests Normal  Final   Gram Stain   Final    RARE WBC PRESENT, PREDOMINANTLY PMN RARE GRAM POSITIVE  COCCI    Culture   Final    FEW Consistent with normal respiratory flora. Performed at [facility], [place] [address] [place], [place] [address]    Report Status 11/24/2018 FINAL  Final  Aerobic/Anaerobic Culture (surgical/deep wound)     Status: None (Preliminary result)   Collection Time: 11/22/18  2:56 PM  Result Value Ref Range Status   Specimen Description BILE  Final   Special Requests NONE  Final   Gram Stain   Final    FEW WBC PRESENT, PREDOMINANTLY PMN MODERATE GRAM NEGATIVE RODS RARE GRAM POSITIVE COCCI IN PAIRS RARE GRAM POSITIVE RODS Performed at [facility], [place] [address], [place], [place] [address]    Culture   Final    MODERATE ENTEROBACTER ASBURIAE MODERATE ENTEROCOCCUS FAECIUM SUSCEPTIBILITIES TO FOLLOW NO ANAEROBES ISOLATED; CULTURE IN PROGRESS FOR 5 DAYS    Report Status PENDING  Incomplete   Organism ID, Bacteria ENTEROBACTER ASBURIAE  Final      Susceptibility   Enterobacter asburiae - MIC*    CEFAZOLIN >=64 RESISTANT Resistant     CEFEPIME <=1 SENSITIVE Sensitive     CEFTAZIDIME <=1 SENSITIVE Sensitive     CEFTRIAXONE <=1 SENSITIVE Sensitive     CIPROFLOXACIN <=0.25 SENSITIVE Sensitive     GENTAMICIN <=1 SENSITIVE Sensitive     IMIPENEM 0.5 SENSITIVE Sensitive     TRIMETH/SULFA <=20 SENSITIVE Sensitive     PIP/TAZO <=4 SENSITIVE Sensitive     * MODERATE ENTEROBACTER ASBURIAE     Terri Piedra, NP [place] [phone] Pager  11/24/2018  4:35 PM

## 2018-11-24 NOTE — Progress Notes (Signed)
140 mL fent wasted in sink with Sherilyn Dacosta RN

## 2018-11-24 NOTE — Progress Notes (Signed)
Pharmacy Antibiotic Note  William [name] is a 85 y.o. male admitted on 11/21/2018 with sepsis 2/2 Enterobacter bacteremia and acute cholecystitis, now s/p per biliary drain on 2/9 with fluid culture growing Enterobacter asbyriae and Enterococcus faecium. Given propensity for E. faecium to be vancomycin resistant and patient's history of MDR infection, patient was switched to Cubicin and now to transition from [place] to [place].   Renal function is stable, afebrile, WBC WNL.  Plan: Zosyn EID 3.375gm IV Q8H Continue Cubicin 700mg  (~8mg /kg AdjBW) IV q24h Monitor renal fxn, clinical progress, weekly CK  Height: 5\' 11"  (180.3 cm) Weight: 225 lb 12 oz (102.4 kg) IBW/kg (Calculated) : 75.3  Temp (24hrs), Avg:99.3 F (37.4 C), Min:98.1 F (36.7 C), Max:100 F (37.8 C)  Recent Labs  Lab 11/21/18 0521 11/21/18 0522 11/21/18 0851 11/21/18 1122 11/22/18 0329 11/22/18 1229 11/23/18 0355 11/24/18 0423  WBC 14.1*  --  13.7*  --  7.7  --  6.4 4.9  CREATININE 1.04  --  1.09  --  0.89 0.89 0.89 0.82  LATICACIDVEN  --  2.3*  --  2.0*  --   --  1.2  --     Estimated Creatinine Clearance: 74.4 mL/min (by C-G formula based on SCr of 0.82 mg/dL).    Allergies  Allergen Reactions  . Crestor [Rosuvastatin] Other (See Comments)    Hurts muscles  . Lipitor [Atorvastatin] Other (See Comments)    Hurts stomach  . Shrimp [Shellfish Allergy] Other (See Comments)    On MAR    2/7vanc >>2/10 2/24meropenem >> 2/10 daptomycin >>2/11 Zosyn 2/11 >>  2/8 MRSA PCR: neg 2/8 BCx x4: 4/4 Enterobacter cloacae 2/8 RVP: neg  2/8 Ucx: >100k yeast  2/8 TA: rare GPC  2/9 bile cx: moderate GNR, E. Faecium   William [name] D. [name], PharmD, BCPS, [place] 11/24/2018, 4:37 PM

## 2018-11-24 NOTE — Plan of Care (Signed)
No acute events reportable   Problem: Fluid Volume: Goal: Hemodynamic stability will improve Outcome: Progressing   Problem: Clinical Measurements: Goal: Diagnostic test results will improve Outcome: Progressing Goal: Signs and symptoms of infection will decrease Outcome: Progressing   Problem: Respiratory: Goal: Ability to maintain adequate ventilation will improve Outcome: Progressing

## 2018-11-24 NOTE — Progress Notes (Signed)
Patient transferred from 47M to 5W-31. Oriented to self and able to follow commands. Cardiac monitoring in place running NSR. Patient's daughter and sitter from [place] at bedside. Will continue to monitor and treat per MD orders.

## 2018-11-24 NOTE — Progress Notes (Signed)
NAME:  William OEHLERT, MRN:  [id number], DOB:  06-08-1928, LOS: 3 ADMISSION DATE:  11/21/2018,  REFERRING MD: edp, CHIEF COMPLAINT:  sepsis  Brief History   85 year old male nursing home resident who has extensive past medical history as well documented below who presents with shakes, chills, sweats, vomiting.  Intubated for airway protection and admitted to the ICU Being treated for severe sepsis with gram-negative rod bacteremia from cholecystitis versus urosepsis  Past Medical History  Recent history of bacteremia with Klebsiella ESBL requiring peripheral surgical venous catheter in 6 weeks of anticoagulant therapy, Tvar January 2020, Ischemic cardiomyopathy History of AICD placement with removal secondary to bacteremia,AAA repair Hypertension,Prostate cancer,Ventricular tachycardia  Significant Hospital Events   11/21/2018 admitted to the hospital for respiratory failure 2/9: Percutaneous drain placed 2/10: Successfully extubated, changed to daptomycin 2/12: Ready for transfer out of ICU Consults:  Surgery, interventional radiology  Procedures:  OETT 2/8 > 2/10 Right radial A-line 2/8 > discontinued  Significant Diagnostic Tests:  CT abdomen pelvis 2/8- cholelithiasis with findings suspicious for acute cholecystitis, stable infrarenal aortic aneurysm, small bilateral effusions with atelectasis  Right upper quadrant ultrasound 2/8-irregular thickened gallbladder wall with multiple gallstones.  Suspicious for acute cholecystitis, right pleural effusion.  Micro Data:  11/21/2018 urine culture 11/21/2018 respiratory culture 11/21/2018 blood cultures x2> Enterobacter colacae 11/21/18 flu a/b>> negative 11/21/18 rvp>> negative  Antimicrobials:  November 21 2018 meropenem>> February 2020 vancomycin>>2/10 dapto 2/10>>>  Interim history/subjective:  He is comfortable no acute distress Objective   Blood pressure 94/66, pulse 76, temperature 99.1 F (37.3 C), temperature source Core,  resp. rate (Abnormal) 29, height 5\' 11"  (1.803 m), weight 102.4 kg, SpO2 100 %.    Vent Mode: CPAP;PSV FiO2 (%):  [30 %] 30 % PEEP:  [5 cmH20] 5 cmH20 Pressure Support:  [10 cmH20] 10 cmH20 Plateau Pressure:  [14 cmH20] 14 cmH20   Intake/Output Summary (Last 24 hours) at 11/24/2018 5631 Last data filed at 11/24/2018 0800 Gross per 24 hour  Intake 3153.42 ml  Output 1990 ml  Net 1163.42 ml   Filed Weights   11/21/18 0512 11/22/18 0500 11/23/18 0340  Weight: 95 kg 95.3 kg 102.4 kg    Examination: General: 85 year old male patient currently awake, interactive, appropriate and cooperative HEENT normocephalic atraumatic no jugular venous distention Pulmonary: Diminished in the bases but no accessory use Cardiac: Regular rate and rhythm Abdomen: Soft nontender no organomegaly, percutaneous drain with bilious output Extremities brisk capillary refill scattered areas of ecchymosis, does have anasarca Neuro: Awake, oriented x2, at times confused.  Moves all extremities.  No focal weakness GU: Clear yellow via Foley catheter  Resolved Hospital Problem list   Ventilator dependent/acute respiratory failure (extubated 2/10)  Assessment & Plan:    Severe sepsis, Enterobacter bacteremia secondary to acute cholecystitis  Recent history of ESBL Klebsiella bacteremia require PICC line and IV antimicrobial therapy.  S/p perc drain per IR on 2/9-fluid growing Enterococcus faecium -pct trending down  Plan Currently meropenem day #5 and daptomycin day #2 still awaiting final cultures  He will keep his current percutaneous drain for 4 to 5 weeks  I will ask infectious disease to comment on length of antimicrobial therapy   100,000 colony count yeast in urine Plan DC Foley catheter  Acute metabolic encephalopathy, superimposed on underlying low-grade dementia Plan Discontinue narcotics Avoid benzodiazepines Mobilize Frequent reorientation Attempting to re-establish normal sleep wake  cycle  Severe deconditioning Plan PT consult; I wonder if he could be an inpatient candidate  extensive cardiac history and includes TAVR January 2020, AICD which was removed due to bacteremia.  History of ventricular tachycardia required shock when AICD was in place.  Severe valvular stenosis.  Nonischemic cardiomyopathy  -trop minimal; no significant change in follow-up echocardiogram in regards ejection fraction Plan Continuing telemetry monitoring Continue amiodarone   Fluid & electrolyte imbalance: hypokalemia  Plan Replace and recheck  Protein calorie malnutrition Plan Dietary consult  Hyperglycemia Plan Sliding scale insulin  Best practice:  Diet: N.p.o., advancing to/11 Pain/Anxiety/Delirium protocol (if indicated): Fentanyl; discontinued 2/10 VAP protocol (if indicated): Discontinued 2/11 DVT prophylaxis: Heparin GI prophylaxis: Protonix; discontinued 2/11 Glucose control: Sliding scale insulin Mobility: None bedrest; advanced out of bed 2/11 Code Status: Full code Family Communication: Daughters updated at bedside 2/11 Disposition: ICU Remains critically ill but he is improving.  Continues to improve improved.  He is profoundly debilitated.  Hopefully we can move him to progressive care today, will continue antimicrobial coverage, ask physical therapy to see him.  Advance his diet.  Advance mobility.  Erick Colace ACNP-BC [place] Pager # [phone] OR # [phone] if no answer

## 2018-11-24 NOTE — Evaluation (Signed)
Clinical/Bedside Swallow Evaluation Patient Details  Name: William [name] MRN: [id number] Date of Birth: 04-23-1928  Today's Date: 11/24/2018 Time: SLP Start Time (ACUTE ONLY): 1110 SLP Stop Time (ACUTE ONLY): 1155 SLP Time Calculation (min) (ACUTE ONLY): 45 min  Past Medical History:  Past Medical History:  Diagnosis Date  . AAA (abdominal aortic aneurysm) ([place])    7/13 3.8cm  . Arthritis    "joints" (01/11/2014)  . Asthma    "seasonal; some foods"   . Chronic systolic CHF (congestive heart failure) ([place])   . Dementia ([place])   . HLD (hyperlipidemia)   . HOH (hard of hearing)   . Lumbar vertebral fracture ([facility])    L1- 04/11/2014   . Prostate cancer ([place])   . S/P ICD (internal cardiac defibrillator) procedure, 01/11/14 removal of ERI gen and placement of Medtronic Evera XT VR & NEW Right ventricular lead Medtronic 01/12/2014  . S/P TAVR (transcatheter aortic valve replacement)    Edwards Sapien 3 THV (size 29 mm, model # [id number], serial # [id number])  . Severe aortic stenosis   . Sleep apnea    "lost 60# & don't have it anymore" (01/11/2014)  . Ventricular tachycardia [facility])    Past Surgical History:  Past Surgical History:  Procedure Laterality Date  . CARDIAC CATHETERIZATION  08/20/2004   noncritical CAD,mild global hypokinesis, EF 50%  . CARDIAC DEFIBRILLATOR PLACEMENT  08/23/2004   Medtronic  . CATARACT EXTRACTION W/ INTRAOCULAR LENS IMPLANT Right   . COLECTOMY  1990's  . CRYOABLATION N/A 02/24/2014   Procedure: CRYO ABLATION PROSTATE;  Surgeon: Ailene Rud, MD;  Location: [facility] ORS;  Service: Urology;  Laterality: N/A;  . HERNIA REPAIR     "abdomen; from colon OR"  . ICD LEAD REMOVAL N/A 09/15/2018   Procedure: ICD LEAD REMOVAL EXTRACTION;  Surgeon: Evans Lance, MD;  Location: [facility] OR;  Service: Cardiovascular;  Laterality: N/A;  DR. BARTLE TO BACK UP  . IMPLANTABLE CARDIOVERTER DEFIBRILLATOR (ICD) GENERATOR CHANGE N/A 01/11/2014   Procedure: ICD GENERATOR CHANGE;   Surgeon: Sanda Klein, MD;  Location: [place] CATH LAB;  Service: Cardiovascular;  Laterality: N/A;  . IR PERC CHOLECYSTOSTOMY  11/22/2018  . LEAD REVISION N/A 01/11/2014   Procedure: LEAD REVISION;  Surgeon: Sanda Klein, MD;  Location: [place] CATH LAB;  Service: Cardiovascular;  Laterality: N/A;  . NM MYOCAR PERF WALL MOTION  01/29/2012   abnormal c/o infarct/scar,no ischemia present  . PROSTATE BIOPSY N/A 11/22/2013   Procedure: PROSTATE BIOPSY AND ULTRASOUND;  Surgeon: Ailene Rud, MD;  Location: [facility] ORS;  Service: Urology;  Laterality: N/A;  . RIGHT/LEFT HEART CATH AND CORONARY ANGIOGRAPHY N/A 07/06/2018   Procedure: RIGHT/LEFT HEART CATH AND CORONARY ANGIOGRAPHY;  Surgeon: Troy Sine, MD;  Location: [place] CV LAB;  Service: Cardiovascular;  Laterality: N/A;  . TEE WITHOUT CARDIOVERSION N/A 08/18/2018   Procedure: TRANSESOPHAGEAL ECHOCARDIOGRAM (TEE);  Surgeon: Burnell Blanks, MD;  Location: [place];  Service: Open Heart Surgery;  Laterality: N/A;  . TEE WITHOUT CARDIOVERSION N/A 09/08/2018   Procedure: TRANSESOPHAGEAL ECHOCARDIOGRAM (TEE);  Surgeon: Lelon Perla, MD;  Location: [facility] ENDOSCOPY;  Service: Cardiovascular;  Laterality: N/A;  . TEE WITHOUT CARDIOVERSION N/A 09/15/2018   Procedure: TRANSESOPHAGEAL ECHOCARDIOGRAM (TEE);  Surgeon: Evans Lance, MD;  Location: [place];  Service: Cardiovascular;  Laterality: N/A;  . TRANSCATHETER AORTIC VALVE REPLACEMENT, TRANSFEMORAL  08/18/2018  . TRANSCATHETER AORTIC VALVE REPLACEMENT, TRANSFEMORAL N/A 08/18/2018   Procedure: TRANSCATHETER AORTIC VALVE REPLACEMENT, TRANSFEMORAL using an Edwards 81mm  Aortic Valve;  Surgeon: Burnell Blanks, MD;  Location: [place];  Service: Open Heart Surgery;  Laterality: N/A;   HPI:  Pt is 85 year old male nursing home resident with PMH of bacteremia, ischemic cardiomyophathym AAA repair, HTN, prostate cancer and ventricular tachycardia, who presents to ED with approximately 15 hours of  shakes, chills, sweats, fever and vomiting x1 with questionable aspiration.  In ED was found to have altered mental status and inability to protect airway and was intubated.  Was also found to be hypotensive presumed septic. Intubated 2/8- 2/10.    Assessment / Plan / Recommendation Clinical Impression  Pt demonstrates concern for mild acute reversible dysphagia following three day intubation. Vocal quality slighlty hoarse. Sips of water and large volume intake both followed by immediate and delayed coughing. Trials of nectar and solids tolerated without incident. Recommend pt initiate a dys 3 mech soft diet with nectar thick liquids with f/u for diet advancement as pt recovers with more time post extubation.  SLP Visit Diagnosis: Dysphagia, oropharyngeal phase (R13.12)    Aspiration Risk  Moderate aspiration risk    Diet Recommendation Dysphagia 3 (Mech soft);Nectar-thick liquid   Liquid Administration via: Cup;Straw Medication Administration: Whole meds with puree Supervision: Staff to assist with self feeding;Full supervision/cueing for compensatory strategies Compensations: Slow rate;Small sips/bites Postural Changes: Seated upright at 90 degrees    Other  Recommendations Oral Care Recommendations: Oral care BID   Follow up Recommendations Inpatient Rehab      Frequency and Duration min 2x/week  2 weeks       Prognosis Prognosis for Safe Diet Advancement: Good      Swallow Study   General HPI: Pt is 85 year old male nursing home resident with PMH of bacteremia, ischemic cardiomyophathym AAA repair, HTN, prostate cancer and ventricular tachycardia, who presents to ED with approximately 15 hours of shakes, chills, sweats, fever and vomiting x1 with questionable aspiration.  In ED was found to have altered mental status and inability to protect airway and was intubated.  Was also found to be hypotensive presumed septic. Intubated 2/8- 2/10.  Type of Study: Bedside Swallow  Evaluation Previous Swallow Assessment: none Diet Prior to this Study: NPO Temperature Spikes Noted: No Respiratory Status: Room air History of Recent Intubation: Yes Length of Intubations (days): 3 days Date extubated: 11/23/18 Behavior/Cognition: Alert;Cooperative Oral Cavity Assessment: Within Functional Limits Oral Care Completed by SLP: No Oral Cavity - Dentition: Adequate natural dentition(has implants) Self-Feeding Abilities: Needs assist Patient Positioning: Upright in bed Baseline Vocal Quality: Normal;Breathy Volitional Cough: Congested Volitional Swallow: Able to elicit    Oral/Motor/Sensory Function Overall Oral Motor/Sensory Function: Within functional limits   Ice Chips Ice chips: Within functional limits   Thin Liquid Thin Liquid: Impaired Presentation: Cup;Straw;Self Fed Pharyngeal  Phase Impairments: Cough - Immediate;Cough - Delayed    Nectar Thick Nectar Thick Liquid: Within functional limits Presentation: Cup;Self Fed   Honey Thick Honey Thick Liquid: Not tested   Puree Puree: Within functional limits Presentation: Self Fed;Spoon   Solid     Solid: Within functional limits Presentation: [place] Tanashia Ciesla, MA [place] Pager [phone] Office [phone]  Lynann Beaver 11/24/2018,11:51 AM

## 2018-11-25 DIAGNOSIS — T826XXD Infection and inflammatory reaction due to cardiac valve prosthesis, subsequent encounter: Secondary | ICD-10-CM

## 2018-11-25 DIAGNOSIS — I38 Endocarditis, valve unspecified: Secondary | ICD-10-CM

## 2018-11-25 DIAGNOSIS — K8309 Other cholangitis: Secondary | ICD-10-CM

## 2018-11-25 DIAGNOSIS — J9602 Acute respiratory failure with hypercapnia: Secondary | ICD-10-CM

## 2018-11-25 DIAGNOSIS — J9601 Acute respiratory failure with hypoxia: Secondary | ICD-10-CM

## 2018-11-25 LAB — COMPREHENSIVE METABOLIC PANEL
ALT: 58 U/L — ABNORMAL HIGH (ref 0–44)
AST: 65 U/L — AB (ref 15–41)
Albumin: 1.7 g/dL — ABNORMAL LOW (ref 3.5–5.0)
Alkaline Phosphatase: 86 U/L (ref 38–126)
Anion gap: 6 (ref 5–15)
BUN: 14 mg/dL (ref 8–23)
CO2: 25 mmol/L (ref 22–32)
Calcium: 7.6 mg/dL — ABNORMAL LOW (ref 8.9–10.3)
Chloride: 108 mmol/L (ref 98–111)
Creatinine, Ser: 0.88 mg/dL (ref 0.61–1.24)
GFR calc Af Amer: >60 mL/min (ref >60)
GFR calc non Af Amer: >60 mL/min (ref >60)
Glucose, Bld: 135 mg/dL — ABNORMAL HIGH (ref 70–99)
POTASSIUM: 3.7 mmol/L (ref 3.5–5.1)
Sodium: 139 mmol/L (ref 135–145)
Total Bilirubin: 1.2 mg/dL (ref 0.3–1.2)
Total Protein: 5.2 g/dL — ABNORMAL LOW (ref 6.5–8.1)

## 2018-11-25 LAB — CBC
HCT: 32.9 % — ABNORMAL LOW (ref 39.0–52.0)
Hemoglobin: 10.2 g/dL — ABNORMAL LOW (ref 13.0–17.0)
MCH: 30.3 pg (ref 26.0–34.0)
MCHC: 31 g/dL (ref 30.0–36.0)
MCV: 97.6 fL (ref 80.0–100.0)
NRBC: 0 % (ref 0.0–0.2)
Platelets: 123 10*3/uL — ABNORMAL LOW (ref 150–400)
RBC: 3.37 MIL/uL — ABNORMAL LOW (ref 4.22–5.81)
RDW: 14.2 % (ref 11.5–15.5)
WBC: 5.7 10*3/uL (ref 4.0–10.5)

## 2018-11-25 LAB — GLUCOSE, CAPILLARY
GLUCOSE-CAPILLARY: 168 mg/dL — AB (ref 70–99)
Glucose-Capillary: 110 mg/dL — ABNORMAL HIGH (ref 70–99)
Glucose-Capillary: 152 mg/dL — ABNORMAL HIGH (ref 70–99)
Glucose-Capillary: 180 mg/dL — ABNORMAL HIGH (ref 70–99)

## 2018-11-25 LAB — MAGNESIUM: Magnesium: 2 mg/dL (ref 1.7–2.4)

## 2018-11-25 LAB — PHOSPHORUS: Phosphorus: 2.2 mg/dL — ABNORMAL LOW (ref 2.5–4.6)

## 2018-11-25 MED ORDER — POLYETHYLENE GLYCOL 3350 17 G PO PACK
17.0000 g | PACK | Freq: Every day | ORAL | Status: DC
  Administered 2018-11-25 – 2018-11-26 (×2): 17 g via ORAL
  Filled 2018-11-25 (×2): qty 1

## 2018-11-25 MED ORDER — SENNA 8.6 MG PO TABS: 2.0000 | ORAL_TABLET | Freq: Every evening | ORAL | Status: DC | PRN

## 2018-11-25 MED ORDER — ENSURE ENLIVE PO LIQD
237.0000 mL | Freq: Two times a day (BID) | ORAL | Status: DC
  Administered 2018-11-26 – 2018-11-27 (×2): 237 mL via ORAL

## 2018-11-25 MED ORDER — AMIODARONE HCL 200 MG PO TABS
100.0000 mg | ORAL_TABLET | Freq: Every day | ORAL | Status: DC
  Administered 2018-11-25 – 2018-11-27 (×3): 100 mg via ORAL
  Filled 2018-11-25 (×3): qty 1

## 2018-11-25 MED ORDER — SODIUM CHLORIDE 0.9 % IV SOLN
INTRAVENOUS | Status: DC
  Administered 2018-11-26: 01:00:00 via INTRAVENOUS

## 2018-11-25 MED ORDER — BISACODYL 10 MG RE SUPP: 10.0000 mg | Freq: Every day | RECTAL | Status: DC | PRN

## 2018-11-25 MED ORDER — CLOPIDOGREL BISULFATE 75 MG PO TABS
75.0000 mg | ORAL_TABLET | Freq: Every day | ORAL | Status: DC
  Administered 2018-11-26 – 2018-11-27 (×2): 75 mg via ORAL
  Filled 2018-11-25 (×2): qty 1

## 2018-11-25 MED ORDER — DOCUSATE SODIUM 100 MG PO CAPS
100.0000 mg | ORAL_CAPSULE | Freq: Two times a day (BID) | ORAL | Status: DC
  Administered 2018-11-25: 100 mg via ORAL
  Filled 2018-11-25: qty 1

## 2018-11-25 NOTE — Progress Notes (Signed)
  Speech Language Pathology Treatment: Dysphagia  Patient Details Name: William [name] MRN: [id number] DOB: [birth year] Today's Date: 11/25/2018 Time: 1010-1030 SLP Time Calculation (min) (ACUTE ONLY): 20 min  Assessment / Plan / Recommendation Clinical Impression  Pt seen for trials of advanced liquid texture. Tolerates a few sips of nectar under supervision, though displeased with taste. Trials of thin liquids resulted in on signs of aspiration, vocal quality also subjectively clearer today. Will advance diet to regular thin and f/u for tolerance. Educated wife and daughter to basic aspiration precautions as well.   HPI HPI: Pt is 85 year old male nursing home resident with PMH of bacteremia, ischemic cardiomyophathym AAA repair, HTN, prostate cancer and ventricular tachycardia, who presents to ED with approximately 15 hours of shakes, chills, sweats, fever and vomiting x1 with questionable aspiration.  In ED was found to have altered mental status and inability to protect airway and was intubated.  Was also found to be hypotensive presumed septic. Intubated 2/8- 2/10.       SLP Plan  Continue with current plan of care       Recommendations  Diet recommendations: Thin liquid;Regular Liquids provided via: Cup;Straw Medication Administration: Whole meds with puree Supervision: Patient able to self feed Compensations: Slow rate;Small sips/bites Postural Changes and/or Swallow Maneuvers: Seated upright 90 degrees                Oral Care Recommendations: Oral care BID Follow up Recommendations: Inpatient Rehab SLP Visit Diagnosis: Dysphagia, oropharyngeal phase (R13.12) Plan: Continue with current plan of care       GO               Herbie Baltimore, MA [place] Pager [phone] Office [phone]  Lynann Beaver 11/25/2018, 10:45 AM

## 2018-11-25 NOTE — Progress Notes (Signed)
Referring Physician(s): Dr. Mannam/Dr. Sloan Leiter  Supervising Physician: Jacqulynn Cadet  Patient Status:  [facility] - In-pt  Chief Complaint: Follow up cholecystostomy placed 2/9  Subjective:  Patient sitting up in bed, RN attempting to give pills in applesauce however patient keeps trying to chew pill and subsequently spitting it out. Wife and daughter at bedside - they have no complaints today. Daughter reports that gravity bag was "very full" last night before it was emptied, she does not think it has been emptied yet today - RN states she has not emptied it. Patient c/o pain but unable to specify where, states "I just feel bad."  Allergies: Crestor [rosuvastatin]; Lipitor [atorvastatin]; and Shrimp [shellfish allergy]  Medications: Prior to Admission medications   Medication Sig Start Date End Date Taking? Authorizing Provider  acetaminophen (TYLENOL) 500 MG tablet Take 500 mg by mouth every 6 (six) hours as needed (for pain).    Yes [provider]  Amino Acids-Protein Hydrolys (FEEDING SUPPLEMENT, PRO-STAT SUGAR FREE 64,) LIQD Take 30 mLs by mouth daily.   Yes [provider]  amiodarone (PACERONE) 200 MG tablet Take 0.5 tablets (100 mg total) by mouth daily. 11/16/18  Yes Croitoru, Mihai, MD  aspirin 81 MG chewable tablet Chew 1 tablet (81 mg total) by mouth daily. 07/08/18  Yes Kroeger, Lorelee Cover., PA-C  cholecalciferol (VITAMIN D) 1000 units tablet Take 2,000 Units by mouth at bedtime.   Yes [provider]  clopidogrel (PLAVIX) 75 MG tablet Take 1 tablet (75 mg total) by mouth daily with breakfast. 08/21/18  Yes Eileen Stanford, PA-C  ezetimibe-simvastatin (VYTORIN) 10-20 MG per tablet Take 1 tablet by mouth at bedtime.   Yes [provider]  metoprolol succinate (TOPROL-XL) 25 MG 24 hr tablet Take 1 tablet (25 mg total) by mouth daily. Take with or immediately following a meal. 09/18/18  Yes Nita Sells, MD  Multiple Vitamins-Minerals  (PRESERVISION AREDS 2 PO) Take 1 capsule by mouth daily.    Yes [provider]  polyethylene glycol (MIRALAX / GLYCOLAX) packet Take 17 g by mouth every evening. Patient taking differently: Take 1 g by mouth every evening. 1 teaspoon daily 01/21/17  Yes Croitoru, Mihai, MD  triamcinolone (KENALOG) 0.025 % cream Apply 1 application topically 2 (two) times daily. Until resolved   Yes [provider]     Vital Signs: BP 109/72 (BP Location: Left Arm)   Pulse 90   Temp 98.4 F (36.9 C)   Resp 19   Ht 5\' 10"  (1.778 m)   Wt 225 lb 12 oz (102.4 kg)   SpO2 97%   BMI 32.39 kg/m   Physical Exam Vitals signs and nursing note reviewed.  Constitutional:      General: He is not in acute distress.    Appearance: Normal appearance.     Comments: Alert, appears confused, unable to provide much history. Wife and daughter at bedside encouraging patient to take medications by mouth.  HENT:     Head: Normocephalic.  Cardiovascular:     Rate and Rhythm: Normal rate.  Pulmonary:     Effort: Pulmonary effort is normal.  Abdominal:     General: There is no distension.     Palpations: Abdomen is soft.     Tenderness: There is abdominal tenderness (over cholecystostomy insertion site).     Comments: (+) cholecystostomy to gravity with scant thick, tan output. Insertion site clean, dry, dressed appropriately without erythema, edema, drainage or active bleeding.    Skin:  General: Skin is warm and dry.  Neurological:     Mental Status: He is alert. Mental status is at baseline.     Imaging: US Abdomen Complete  Result Date: 11/21/2018 CLINICAL DATA:  Acute cholecystitis an urinary tract infection. Patient is intubated. Elevated total bilirubin. EXAM: ABDOMEN ULTRASOUND COMPLETE COMPARISON:  CT on 11/21/2018 a prior CT exams including 12/07/2013 FINDINGS: Gallbladder: Gallbladder wall is irregular and thickened, measuring 7 millimeters. Dependent stones are present, measuring on  the order of 1.7 centimeters or smaller. Common bile duct: Diameter: 5.4 millimeter Liver: The visualized portion of the liver is unremarkable. LEFT hepatic lobe is not well seen because of bowel gas. Portal vein is patent on color Doppler imaging with normal direction of blood flow towards the liver. IVC: Visualized portion normal. Pancreas: Obscured by bowel gas. Spleen: Obscured by bowel gas. Right Kidney: Length: 11.0 centimeters. 4.6 x 4.5 x 5.2 centimeter cyst containing internal septation. On CT exam 2015, this lesion measured 3.2 x 3.2 centimeters. No hydronephrosis. Left Kidney: Length: 12.3 centimeters. Simple cyst is 3.5 x 2.4 x 2.9 centimeters. No hydronephrosis. Abdominal aorta: Not visualized on the level of the mid abdominal aorta to the bifurcation segment 1 bowel gas. No visualized aneurysm. Other findings: Study quality is degraded by patient body habitus and intubation. Note is made of a small RIGHT pleural effusion. IMPRESSION: 1. Irregular, thickened gallbladder wall and multiple gallstones. Presence or absence of sonographic Murphy's can not be assessed due to the patient's intubated status. Findings are suspicious for acute cholecystitis however. 2. RIGHT pleural effusion. 3. Complex cystic lesion within the UPPER pole the RIGHT kidney. Lesion has progressed since prior studies. If possible and needed, consider further characterization with outpatient contrast-enhanced CT or MRI with intravenous contrast. Electronically Signed   By: Nolon Nations M.D.   On: 11/21/2018 12:46   Ir Perc Cholecystostomy  Result Date: 11/23/2018 INDICATION: 85 year old male with a history of cholecystitis EXAM: CHOLECYSTOSTOMY MEDICATIONS: In-hospital antibiotics ANESTHESIA/SEDATION: Moderate (conscious) sedation was employed during this procedure. A total of Versed 2.0 mg and Fentanyl 0 mcg was administered intravenously. Moderate Sedation Time: 0 minutes. The patient's level of consciousness and vital  signs were monitored continuously by radiology nursing throughout the procedure under my direct supervision. FLUOROSCOPY TIME:  Fluoroscopy Time: 0 minutes 24 seconds (42.6 mGy). COMPLICATIONS: None PROCEDURE: Informed written consent was obtained from the patient and the patient's family after a thorough discussion of the procedural risks, benefits and alternatives. All questions were addressed. Maximal Sterile Barrier Technique was utilized including caps, mask, sterile gowns, sterile gloves, sterile drape, hand hygiene and skin antiseptic. A timeout was performed prior to the initiation of the procedure. Ultrasound survey of the right upper quadrant was performed for planning purposes. Once the patient is prepped and draped in the usual sterile fashion, the skin and subcutaneous tissues overlying the gallbladder were generously infiltrated 1% lidocaine for local anesthesia. A coaxial needle was advanced under ultrasound guidance through the skin subcutaneous tissues and a small segment of liver into the gallbladder lumen. With removal of the stylet, spontaneous dark bile drainage occurred. Using modified Seldinger technique, a 10 French drain was placed into the gallbladder fossa, with aspiration of the sample for the lab. Contrast injection confirmed position of the tube within the gallbladder lumen. Drainage catheter was attached to gravity drain with a suture retention placed. Patient tolerated the procedure well and remained hemodynamically stable throughout. No complications were encountered and no significant blood loss encountered. IMPRESSION: Status post  image guided percutaneous cholecystostomy. Signed, Dulcy Fanny. Dellia Nims, RPVI [facility] Electronically Signed   By: Corrie Mckusick D.O.   On: 11/23/2018 08:16   Dg Chest Port 1 View  Result Date: 11/23/2018 CLINICAL DATA:  Acute respiratory failure EXAM: PORTABLE CHEST 1 VIEW COMPARISON:  Two  days ago FINDINGS: Endotracheal tube tip between the clavicular heads and carina. The orogastric tube reaches the stomach. Low volume chest with streaky density at the bases. There may be trace pleural fluid. No Kerley lines or pneumothorax. Cardiomegaly. Transcatheter aortic valve replacement IMPRESSION: Stable hardware positioning, low lung volumes, and presumed atelectasis. Electronically Signed   By: Monte Fantasia M.D.   On: 11/23/2018 06:53    Labs:  CBC: Recent Labs    11/22/18 0329  11/23/18 0355 11/23/18 0751 11/24/18 0423 11/25/18 0357  WBC 7.7  --  6.4  --  4.9 5.7  HGB 10.0*   < > 10.3* 15.0 9.1* 10.2*  HCT 30.4*   < > 31.0* 44.0 28.5* 32.9*  PLT 107*  --  105*  --  102* 123*   < > = values in this interval not displayed.    COAGS: Recent Labs    08/07/18 1411 08/13/18 0409 11/21/18 0521 11/21/18 0851 11/22/18 1229  INR 1.25 1.09 1.20 1.33 1.39  APTT 34 37*  --  34  --     BMP: Recent Labs    11/22/18 1229 11/23/18 0355 11/23/18 0751 11/24/18 0423 11/25/18 0357  NA 139 141 142 142 139  K 3.4* 3.1* 3.4* 2.9* 3.7  CL 113* 113*  --  114* 108  CO2 20* 20*  --  21* 25  GLUCOSE 181* 147*  --  113* 135*  BUN 24* 23  --  17 14  CALCIUM 7.7* 7.4*  --  7.5* 7.6*  CREATININE 0.89 0.89  --  0.82 0.88  GFRNONAA >60 >60  --  >60 >60  GFRAA >60 >60  --  >60 >60    LIVER FUNCTION TESTS: Recent Labs    11/22/18 1229 11/23/18 0355 11/24/18 0423 11/25/18 0357  BILITOT 0.9 0.8 0.7 1.2  AST 36 46* 54* 65*  ALT 32 41 48* 58*  ALKPHOS 75 79 76 86  PROT 5.3* 5.3* 4.8* 5.2*  ALBUMIN 1.6* 1.7* 1.5* 1.7*    Assessment and Plan:  Patient s/p cholecystostomy placed 11/22/18 by Dr. Earleen Newport for acute cholecystitis. OP 30 cc in last 24H. Patient alert, tolerating PO intake per family. Some soreness at drain insertion site today. WBC 5.7, hgb 10.2, plt 123, AST 65, ALT 58, ALP 86, t.bili 1.2.  Continue TID flushes with 3-5 cc NS, record drain output daily. Drain will  remain for at least 6 weeks - will place follow up orders closer to d/c.  IR will continue to follow. Please call with questions or concerns.   Electronically Signed: Joaquim Nam, PA-C 11/25/2018, 10:16 AM   I spent a total of 15 Minutes at the the patient's bedside AND on the patient's hospital floor or unit, greater than 50% of which was counseling/coordinating care for cholecystostomy.

## 2018-11-25 NOTE — Progress Notes (Signed)
Nutrition Follow-up  DOCUMENTATION CODES:   Not applicable  INTERVENTION:   Ensure Enlive po BID, each supplement provides 350 kcal and 20 grams of protein  Magic cup BID with meals, each supplement provides 290 kcal and 9 grams of protein   NUTRITION DIAGNOSIS:   Increased nutrient needs related to wound healing as evidenced by estimated needs.  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 Admitted for septic shock secondary to UTI and possible acute cholecystitis.   2/08 Intubated 2/09 Cholecystitis s/p perc chole drain 2/10 Extubated 2/11 Dysphagia III, Nectar 2/12 Regular, Thins  Pt alert, heard of hearing. Daughter at bedside.  Diet upgraded to Regular with Thins from Dysphagia III with Nectar Thick Liquids today.  Pt appetite is not great; ate 1/2 piece of french toast, bites of sausage and thickened juice this AM. No recorded po intake.   Pt working with PT; sat on side of bed today but Daugther reporting this is quite the setback as prior to admission pt walking 30 feet with PT [facility]. Pt had been at [facility] for 9 weeks   Prior to admission, pt eating very well per daughter. Maintaining weight recently, no taking ONS.   Conistpation; No BM since admission; started on bowel regimen  Biliary drain in place; minimal output  Admission wt 95 kg; current wt 102 kg from 2/10. No weight today. Pt is net positive  Labs: phosphorus 2.2  Meds: miralax, senokot   Diet Order:   Diet Order            Diet regular Room service appropriate? Yes; Fluid consistency: Thin  Diet effective now               EDUCATION NEEDS:   Not appropriate for education at this time  Skin:  Skin Assessment: Skin Integrity Issues: Skin Integrity Issues:: Stage II Stage II: buttocks  Last BM:  2/8  Height:   Ht Readings from Last 1 Encounters:  11/24/18 5\' 10"  (1.778 m)    Weight:   Wt Readings from Last 1 Encounters:  11/23/18 102.4 kg    Ideal Body Weight:  78.2 kg  BMI:  Body mass index is 32.39 kg/m.  Estimated Nutritional Needs:   Kcal:  1790-2090 kcals   Protein:  90-100 g  Fluid:  >/= 1.8 L    Kerman Passey MS, RD, LDN, CNSC [phone] Pager  [phone] Weekend/On-Call Pager

## 2018-11-25 NOTE — Progress Notes (Signed)
[facility] Progress Note     Subjective: CC-  Daughter at bedside. Patient states that his abdomen is sore. Denies n/v. Started on a dysphagia 3 diet yesterday. Ate 100% of his dinner. Passing flatus. No BM in about 1 week.  Objective: Vital signs in last 24 hours: Temp:  [98.1 F (36.7 C)-99.3 F (37.4 C)] 98.4 F (36.9 C) (02/12 0513) Pulse Rate:  [77-90] 90 (02/11 2125) Resp:  [19-26] 19 (02/12 0513) BP: (79-109)/(56-72) 109/72 (02/12 0513) SpO2:  [97 %-100 %] 97 % (02/11 2125) Last BM Date: (PTA)  Intake/Output from previous day: 02/11 0701 - 02/12 0700 In: 544.3 [I.V.:324.9; IV Piggyback:209.4] Out: 1080 [Urine:1050; Drains:30] Intake/Output this shift: No intake/output data recorded.  PE: Gen:  Alert, NAD, pleasant HEENT: EOM's intact, pupils equal and round Pulm:  effort normal Abd: Soft, NT/ND, +BS, no HSM, RUQ drain with brown bilious output (30cc recorded over last 24 hours) Skin: warm and dry  Lab Results:  Recent Labs    11/24/18 0423 11/25/18 0357  WBC 4.9 5.7  HGB 9.1* 10.2*  HCT 28.5* 32.9*  PLT 102* 123*   BMET Recent Labs    11/24/18 0423 11/25/18 0357  NA 142 139  K 2.9* 3.7  CL 114* 108  CO2 21* 25  GLUCOSE 113* 135*  BUN 17 14  CREATININE 0.82 0.88  CALCIUM 7.5* 7.6*   PT/INR Recent Labs    11/22/18 1229  LABPROT 16.9*  INR 1.39   CMP     Component Value Date/Time   NA 139 11/25/2018 0357   NA 137 10/05/2018 0500   K 3.7 11/25/2018 0357   CL 108 11/25/2018 0357   CO2 25 11/25/2018 0357   GLUCOSE 135 (H) 11/25/2018 0357   BUN 14 11/25/2018 0357   BUN 18 10/05/2018 0500   CREATININE 0.88 11/25/2018 0357   CREATININE 1.12 (H) 02/05/2017 1103   CALCIUM 7.6 (L) 11/25/2018 0357   PROT 5.2 (L) 11/25/2018 0357   ALBUMIN 1.7 (L) 11/25/2018 0357   AST 65 (H) 11/25/2018 0357   ALT 58 (H) 11/25/2018 0357   ALKPHOS 86 11/25/2018 0357   BILITOT 1.2 11/25/2018 0357   GFRNONAA >60 11/25/2018 0357   GFRAA >60  11/25/2018 0357   Lipase     Component Value Date/Time   LIPASE 18 11/22/2018 1229       Studies/Results: No results found.  Anti-infectives: Anti-infectives (From admission, onward)   Start     Dose/Rate Route Frequency Ordered Stop   11/24/18 2100  piperacillin-tazobactam (ZOSYN) IVPB 3.375 g     3.375 g 12.5 mL/hr over 240 Minutes Intravenous Every 8 hours 11/24/18 1639     11/23/18 2000  DAPTOmycin (CUBICIN) 700 mg in sodium chloride 0.9 % IVPB     700 mg 228 mL/hr over 30 Minutes Intravenous Daily 11/23/18 1353     11/22/18 0800  vancomycin (VANCOCIN) 1,500 mg in sodium chloride 0.9 % 500 mL IVPB  Status:  Discontinued     1,500 mg 250 mL/hr over 120 Minutes Intravenous Every 24 hours 11/21/18 0645 11/23/18 1334   11/21/18 1400  meropenem (MERREM) 1 g in sodium chloride 0.9 % 100 mL IVPB  Status:  Discontinued     1 g 200 mL/hr over 30 Minutes Intravenous Every 8 hours 11/21/18 0645 11/24/18 1632   11/21/18 0545  meropenem (MERREM) 1 g in sodium chloride 0.9 % 100 mL IVPB     1 g 200 mL/hr over 30 Minutes Intravenous STAT  11/21/18 0541 11/21/18 0648   11/21/18 0530  ceFEPIme (MAXIPIME) 2 g in sodium chloride 0.9 % 100 mL IVPB  Status:  Discontinued     2 g 200 mL/hr over 30 Minutes Intravenous  Once 11/21/18 0526 11/21/18 0535   11/21/18 0530  metroNIDAZOLE (FLAGYL) IVPB 500 mg  Status:  Discontinued     500 mg 100 mL/hr over 60 Minutes Intravenous Every 8 hours 11/21/18 0526 11/21/18 0535   11/21/18 0530  vancomycin (VANCOCIN) IVPB 1000 mg/200 mL premix  Status:  Discontinued     1,000 mg 200 mL/hr over 60 Minutes Intravenous  Once 11/21/18 0526 11/21/18 0528   11/21/18 0530  vancomycin (VANCOCIN) 2,000 mg in sodium chloride 0.9 % 500 mL IVPB     2,000 mg 250 mL/hr over 120 Minutes Intravenous  Once 11/21/18 0528 11/21/18 8616       Assessment/Plan Sepsis  Severe AS, s/p TAVR Dementia AAA CHF  Cholecystitis, s/p perc chole drain by IR on 2/9  -culture:  ENTEROBACTER ASBURIAE  -will need outpatient drain injection at 4-5 weeks and then follow up with [facility] in 6 weeks to discuss further plans. Discussed with daughter at bedside.  FEN -dysphagia 3 diet VTE -sq heparin ID -Merrem 2/8 -->2/11, Vanc 2/8-->2/10, daptomycin 2/10-->, zosyn 2/11-->  Plan: Continue drain, antibiotics per ID. Patient is tolerating diet but has not had a BM in about 1 week. Add daily miralax/colace. PT/OT consult. Doing well from drain standpoint, general surgery will follow peripherally. Call with any questions/concerns.   LOS: 4 days    Wellington Hampshire , [facility] 11/25/2018, 8:27 AM Pager: [phone] Mon-Thurs 7:00 am-4:30 pm Fri 7:00 am -11:30 AM Sat-Sun 7:00 am-11:30 am

## 2018-11-25 NOTE — H&P (View-Only) (Signed)
Cardiology Consultation:   Patient ID: FARHAD BURLESON MRN: [id number]; DOB: 12-Oct-1928  Admit date: 11/21/2018 Date of Consult: 11/25/2018  Primary Care Provider: Haywood Pao, MD Primary Cardiologist: Sanda Klein, MD  Primary Electrophysiologist:  None    Patient Profile:   Price Lachapelle Zogg is a 85 y.o. male with a hx of AAA, NICM/chronic HF s/p ICD (now removed), VT, OSA, HLD, degenerative arthritis/lumber vertebral fracture with reduced mobility and severe AS s/p TAVR who is being seen today for the evaluation of bacteremia at the request of Dr. Sloan Leiter  History of Present Illness:   Mr. Becvar is an 85 yo male with of AAA, NICM/chronic HF s/p ICD (now removed), VT, OSA, HLD, degenerative arthritis/lumber vertebral fracture with reduced mobility and severe AS s/p TAVR.  He most recently successfully underwent TAVR with a 29 mm Edwards Sapien 3 THV via the TF approach on 08/18/2018.  Postoperative echo showed EF of 30% with normal functioning TAVR with no perivalvular leak with a mean gradient of 9 mmHg.  He was discharged home on aspirin and Plavix and initially did very well and felt much better after his TAVR.  He was readmitted 11/12 through 09/21/2018 for ESBL bacteremia and Klebsiella pacemaker lead vegetation.  He underwent device extraction on 09/15/2018 and was discharged on 6 weeks of IV antibiotics through a PICC line which was planned to be discontinued 10/27/2018.  Of note he was seen in the ER on 10/10/2018 for head laceration repair after a fall.  He was last seen in the clinic on 10/21/2018 by Nell Range for clinic follow-up.  He reported to be recovering well at [facility] in tolerating rehab well.  He was instructed to continue Plavix with discontinuation after 6 months of therapy on 02/2019.  SBE prophylaxis was discussed as well.  Rhythm was noted to be stable and he was continued on amiodarone.  Echo done in the office that days showed no evidence of  vegetation.  This admission he presented on 11/21/2018 with severe sepsis secondary to acute cholecystitis and Enterobacter Cloacae bacteremia and acute hypoxic respiratory failure requiring intubation.  He was managed in the ICU, and extubated on 2/10 and stabilized with transfer to the hospitalist service on 2/12.  On 2/9 he had a percutaneous Chole drain placed with surgery.  This has been maintained and he has been followed by [facility].  Maintained on Zosyn and daptomycin.  Infectious disease was consulted and recommendation for TEE to reassess his AV prosthesis.  Past Medical History:  Diagnosis Date  . AAA (abdominal aortic aneurysm) ([place])    7/13 3.8cm  . Arthritis    "joints" (01/11/2014)  . Asthma    "seasonal; some foods"   . Chronic systolic CHF (congestive heart failure) ([place])   . Dementia ([place])   . HLD (hyperlipidemia)   . HOH (hard of hearing)   . Lumbar vertebral fracture ([facility])    L1- 04/11/2014   . Prostate cancer ([place])   . S/P ICD (internal cardiac defibrillator) procedure, 01/11/14 removal of ERI gen and placement of Medtronic Evera XT VR & NEW Right ventricular lead Medtronic 01/12/2014  . S/P TAVR (transcatheter aortic valve replacement)    Edwards Sapien 3 THV (size 29 mm, model # [id number], serial # [id number])  . Severe aortic stenosis   . Sleep apnea    "lost 60# & don't have it anymore" (01/11/2014)  . Ventricular tachycardia [facility])     Past Surgical History:  Procedure Laterality Date  . CARDIAC  CATHETERIZATION  08/20/2004   noncritical CAD,mild global hypokinesis, EF 50%  . CARDIAC DEFIBRILLATOR PLACEMENT  08/23/2004   Medtronic  . CATARACT EXTRACTION W/ INTRAOCULAR LENS IMPLANT Right   . COLECTOMY  1990's  . CRYOABLATION N/A 02/24/2014   Procedure: CRYO ABLATION PROSTATE;  Surgeon: Ailene Rud, MD;  Location: [facility] ORS;  Service: Urology;  Laterality: N/A;  . HERNIA REPAIR     "abdomen; from colon OR"  . ICD LEAD REMOVAL N/A 09/15/2018   Procedure: ICD LEAD  REMOVAL EXTRACTION;  Surgeon: Evans Lance, MD;  Location: [facility] OR;  Service: Cardiovascular;  Laterality: N/A;  DR. BARTLE TO BACK UP  . IMPLANTABLE CARDIOVERTER DEFIBRILLATOR (ICD) GENERATOR CHANGE N/A 01/11/2014   Procedure: ICD GENERATOR CHANGE;  Surgeon: Sanda Klein, MD;  Location: [place] CATH LAB;  Service: Cardiovascular;  Laterality: N/A;  . IR PERC CHOLECYSTOSTOMY  11/22/2018  . LEAD REVISION N/A 01/11/2014   Procedure: LEAD REVISION;  Surgeon: Sanda Klein, MD;  Location: [place] CATH LAB;  Service: Cardiovascular;  Laterality: N/A;  . NM MYOCAR PERF WALL MOTION  01/29/2012   abnormal c/o infarct/scar,no ischemia present  . PROSTATE BIOPSY N/A 11/22/2013   Procedure: PROSTATE BIOPSY AND ULTRASOUND;  Surgeon: Ailene Rud, MD;  Location: [facility]S;  Service: Urology;  Laterality: N/A;  . RIGHT/LEFT HEART CATH AND CORONARY ANGIOGRAPHY N/A 07/06/2018   Procedure: RIGHT/LEFT HEART CATH AND CORONARY ANGIOGRAPHY;  Surgeon: Troy Sine, MD;  Location: [place] CV LAB;  Service: Cardiovascular;  Laterality: N/A;  . TEE WITHOUT CARDIOVERSION N/A 08/18/2018   Procedure: TRANSESOPHAGEAL ECHOCARDIOGRAM (TEE);  Surgeon: Burnell Blanks, MD;  Location: [place];  Service: Open Heart Surgery;  Laterality: N/A;  . TEE WITHOUT CARDIOVERSION N/A 09/08/2018   Procedure: TRANSESOPHAGEAL ECHOCARDIOGRAM (TEE);  Surgeon: Lelon Perla, MD;  Location: [facility] ENDOSCOPY;  Service: Cardiovascular;  Laterality: N/A;  . TEE WITHOUT CARDIOVERSION N/A 09/15/2018   Procedure: TRANSESOPHAGEAL ECHOCARDIOGRAM (TEE);  Surgeon: Evans Lance, MD;  Location: [place];  Service: Cardiovascular;  Laterality: N/A;  . TRANSCATHETER AORTIC VALVE REPLACEMENT, TRANSFEMORAL  08/18/2018  . TRANSCATHETER AORTIC VALVE REPLACEMENT, TRANSFEMORAL N/A 08/18/2018   Procedure: TRANSCATHETER AORTIC VALVE REPLACEMENT, TRANSFEMORAL using an Edwards 40m Aortic Valve;  Surgeon: MBurnell Blanks MD;  Location[place]  Service: Open  Heart Surgery;  Laterality: N/A;     Home Medications:  Prior to Admission medications   Medication Sig Start Date End Date Taking? Authorizing Provider  acetaminophen (TYLENOL) 500 MG tablet Take 500 mg by mouth every 6 (six) hours as needed (for pain).    Yes [provider]  Amino Acids-Protein Hydrolys (FEEDING SUPPLEMENT, PRO-STAT SUGAR FREE 64,) LIQD Take 30 mLs by mouth daily.   Yes [provider]  amiodarone (PACERONE) 200 MG tablet Take 0.5 tablets (100 mg total) by mouth daily. 11/16/18  Yes Croitoru, Mihai, MD  aspirin 81 MG chewable tablet Chew 1 tablet (81 mg total) by mouth daily. 07/08/18  Yes Kroeger, KLorelee Cover, PA-C  cholecalciferol (VITAMIN D) 1000 units tablet Take 2,000 Units by mouth at bedtime.   Yes [provider]  clopidogrel (PLAVIX) 75 MG tablet Take 1 tablet (75 mg total) by mouth daily with breakfast. 08/21/18  Yes TEileen Stanford PA-C  ezetimibe-simvastatin (VYTORIN) 10-20 MG per tablet Take 1 tablet by mouth at bedtime.   Yes [provider]  metoprolol succinate (TOPROL-XL) 25 MG 24 hr tablet Take 1 tablet (25 mg total) by mouth daily. Take with or immediately  following a meal. 09/18/18  Yes Nita Sells, MD  Multiple Vitamins-Minerals (PRESERVISION AREDS 2 PO) Take 1 capsule by mouth daily.    Yes [provider]  polyethylene glycol (MIRALAX / GLYCOLAX) packet Take 17 g by mouth every evening. Patient taking differently: Take 1 g by mouth every evening. 1 teaspoon daily 01/21/17  Yes Croitoru, Mihai, MD  triamcinolone (KENALOG) 0.025 % cream Apply 1 application topically 2 (two) times daily. Until resolved   Yes [provider]    Inpatient Medications: Scheduled Meds: . amiodarone  100 mg Oral Daily  . aspirin  81 mg Oral Daily  . chlorhexidine gluconate (MEDLINE KIT)  15 mL Mouth Rinse BID  . [START ON 11/26/2018] clopidogrel  75 mg Oral Q breakfast  . heparin  5,000 Units Subcutaneous Q8H  .  insulin aspart  0-9 Units Subcutaneous TID AC & HS  . polyethylene glycol  17 g Oral Daily  . sodium chloride flush  10-40 mL Intracatheter Q12H  . sodium chloride flush  5 mL Intracatheter Q8H   Continuous Infusions: . sodium chloride Stopped (11/21/18 0959)  . sodium chloride    . piperacillin-tazobactam (ZOSYN)  IV 3.375 g (11/25/18 1301)   PRN Meds: [CANCELED] Place/Maintain arterial line **AND** sodium chloride, acetaminophen, bisacodyl, Melatonin, RESOURCE THICKENUP CLEAR, senna, sodium chloride flush  Allergies:    Allergies  Allergen Reactions  . Crestor [Rosuvastatin] Other (See Comments)    Hurts muscles  . Lipitor [Atorvastatin] Other (See Comments)    Hurts stomach  . Shrimp [Shellfish Allergy] Other (See Comments)    On MAR    Social History:   Social History   Socioeconomic History  . Marital status: Married    Spouse name: Not on file  . Number of children: 2  . Years of education: Not on file  . Highest education level: Not on file  Occupational History  . Occupation: Owned a Scientist, [employer]  Social Needs  . Financial resource strain: Not on file  . Food insecurity:    Worry: Not on file    Inability: Not on file  . Transportation needs:    Medical: Not on file    Non-medical: Not on file  Tobacco Use  . Smoking status: Never Smoker  . Smokeless tobacco: Never Used  Substance and Sexual Activity  . Alcohol use: Yes    Alcohol/week: 0.0 standard drinks    Comment: 01/11/2014 "drink 1/2 of a beer twice per month   . Drug use: No  . Sexual activity: Not Currently  Lifestyle  . Physical activity:    Days per week: Not on file    Minutes per session: Not on file  . Stress: Not on file  Relationships  . Social connections:    Talks on phone: Not on file    Gets together: Not on file    Attends religious service: Not on file    Active member of club or organization: Not on file    Attends meetings of clubs or organizations: Not on file     Relationship status: Not on file  . Intimate partner violence:    Fear of current or ex partner: Not on file    Emotionally abused: Not on file    Physically abused: Not on file    Forced sexual activity: Not on file  Other Topics Concern  . Not on file  Social History Narrative  . Not on file    Family History:    Family History  Problem Relation Age of Onset  . Heart failure Mother        Died of "old age" at 59  . Pneumonia Father      ROS:  Please see the history of present illness.   All other ROS reviewed and negative.     Physical Exam/Data:   Vitals:   11/24/18 2125 11/25/18 0513 11/25/18 0800 11/25/18 1100  BP: 102/67 109/72    Pulse: 90   (!) 129  Resp: (!) 23 19    Temp: 98.9 F (37.2 C) 98.4 F (36.9 C) 98 F (36.7 C)   TempSrc: Oral  Oral   SpO2: 97%     Weight:      Height:        Intake/Output Summary (Last 24 hours) at 11/25/2018 1601 Last data filed at 11/25/2018 1527 Gross per 24 hour  Intake 124.55 ml  Output 430 ml  Net -305.45 ml   Last 3 Weights 11/23/2018 11/22/2018 11/21/2018  Weight (lbs) 225 lb 12 oz 210 lb 1.6 oz 209 lb 7 oz  Weight (kg) 102.4 kg 95.3 kg 95 kg     Body mass index is 32.39 kg/m.  General:  Well nourished, well developed, in no acute distress, older WM HEENT: normal Lymph: no adenopathy Neck: no JVD Endocrine:  No thryomegaly Vascular: No carotid bruits; FA pulses 2+ bilaterally without bruits  Cardiac:  normal S1, S2; RRR; soft systolic murmur  Lungs:  clear to auscultation bilaterally, no wheezing, rhonchi or rales  Abd: soft, nontender, no hepatomegaly  Ext: no edema Musculoskeletal:  No deformities, BUE and BLE strength normal and equal Skin: warm and dry  Neuro:  CNs 2-12 intact, no focal abnormalities noted  EKG:  The EKG was personally reviewed and demonstrates:  ST Telemetry:  Telemetry was personally reviewed and demonstrates:  N/a  Relevant CV Studies:  TTE: 11/23/2018  IMPRESSIONS    1. The  left ventricle has severely reduced systolic function of 38-25%. The cavity size was normal. There is severely increased left ventricular wall thickness. Echo evidence of impaired diastolic relaxation.  2. The right ventricle has normal systolic function. The cavity was normal. There is no increase in right ventricular wall thickness.  3. Left atrial size was mildly dilated.  4. The mitral valve is normal in structure. There is mild thickening.  5. The tricuspid valve is normal in structure.  6. The aortic valve was not assessed There is mild thickening and mild calcification of the aortic valve. Aortic valve regurgitation is trivial by color flow Doppler.  7. AV prosthesis is difficult to see Peak and mean gradients across valve are 24 and 11 mm Hg respectively.  8. LVEF is severely depressed with diffuse hypokinesis; lateral akinesis.  Laboratory Data:  Chemistry Recent Labs  Lab 11/23/18 0355 11/23/18 0751 11/24/18 0423 11/25/18 0357  NA 141 142 142 139  K 3.1* 3.4* 2.9* 3.7  CL 113*  --  114* 108  CO2 20*  --  21* 25  GLUCOSE 147*  --  113* 135*  BUN 23  --  17 14  CREATININE 0.89  --  0.82 0.88  CALCIUM 7.4*  --  7.5* 7.6*  GFRNONAA >60  --  >60 >60  GFRAA >60  --  >60 >60  ANIONGAP 8  --  7 6    Recent Labs  Lab 11/23/18 0355 11/24/18 0423 11/25/18 0357  PROT 5.3* 4.8* 5.2*  ALBUMIN 1.7* 1.5* 1.7*  AST 46* 54* 65*  ALT 41 48* 58*  ALKPHOS 79 76 86  BILITOT 0.8 0.7 1.2   Hematology Recent Labs  Lab 11/23/18 0355 11/23/18 0751 11/24/18 0423 11/25/18 0357  WBC 6.4  --  4.9 5.7  RBC 3.26*  --  2.93* 3.37*  HGB 10.3* 15.0 9.1* 10.2*  HCT 31.0* 44.0 28.5* 32.9*  MCV 95.1  --  97.3 97.6  MCH 31.6  --  31.1 30.3  MCHC 33.2  --  31.9 31.0  RDW 14.6  --  14.5 14.2  PLT 105*  --  102* 123*   Cardiac Enzymes Recent Labs  Lab 11/21/18 0851 11/21/18 1350 11/21/18 1939  TROPONINI 0.06* 0.05* 0.04*   No results for input(s): TROPIPOC in the last 168 hours.    BNP Recent Labs  Lab 11/21/18 0851  BNP 332.5*    DDimer No results for input(s): DDIMER in the last 168 hours.  Radiology/Studies:  Ir Perc Cholecystostomy  Result Date: 11/23/2018 INDICATION: 85 year old male with a history of cholecystitis EXAM: CHOLECYSTOSTOMY MEDICATIONS: In-hospital antibiotics ANESTHESIA/SEDATION: Moderate (conscious) sedation was employed during this procedure. A total of Versed 2.0 mg and Fentanyl 0 mcg was administered intravenously. Moderate Sedation Time: 0 minutes. The patient's level of consciousness and vital signs were monitored continuously by radiology nursing throughout the procedure under my direct supervision. FLUOROSCOPY TIME:  Fluoroscopy Time: 0 minutes 24 seconds (42.6 mGy). COMPLICATIONS: None PROCEDURE: Informed written consent was obtained from the patient and the patient's family after a thorough discussion of the procedural risks, benefits and alternatives. All questions were addressed. Maximal Sterile Barrier Technique was utilized including caps, mask, sterile gowns, sterile gloves, sterile drape, hand hygiene and skin antiseptic. A timeout was performed prior to the initiation of the procedure. Ultrasound survey of the right upper quadrant was performed for planning purposes. Once the patient is prepped and draped in the usual sterile fashion, the skin and subcutaneous tissues overlying the gallbladder were generously infiltrated 1% lidocaine for local anesthesia. A coaxial needle was advanced under ultrasound guidance through the skin subcutaneous tissues and a small segment of liver into the gallbladder lumen. With removal of the stylet, spontaneous dark bile drainage occurred. Using modified Seldinger technique, a 10 French drain was placed into the gallbladder fossa, with aspiration of the sample for the lab. Contrast injection confirmed position of the tube within the gallbladder lumen. Drainage catheter was attached to gravity drain with a suture  retention placed. Patient tolerated the procedure well and remained hemodynamically stable throughout. No complications were encountered and no significant blood loss encountered. IMPRESSION: Status post image guided percutaneous cholecystostomy. Signed, Dulcy Fanny. Dellia Nims, RPVI [facility] Radiology Electronically Signed   By: Corrie Mckusick D.O.   On: 11/23/2018 08:16   Dg Chest Port 1 View  Result Date: 11/23/2018 CLINICAL DATA:  Acute respiratory failure EXAM: PORTABLE CHEST 1 VIEW COMPARISON:  Two days ago FINDINGS: Endotracheal tube tip between the clavicular heads and carina. The orogastric tube reaches the stomach. Low volume chest with streaky density at the bases. There may be trace pleural fluid. No Kerley lines or pneumothorax. Cardiomegaly. Transcatheter aortic valve replacement IMPRESSION: Stable hardware positioning, low lung volumes, and presumed atelectasis. Electronically Signed   By: Monte Fantasia M.D.   On: 11/23/2018 06:53    Assessment and Plan:   VONNIE LIGMAN is a 85 y.o. male with a hx of AAA, NICM/chronic HF s/p ICD (now removed), VT, OSA, HLD, degenerative arthritis/lumber vertebral fracture with reduced mobility  and severe AS s/p TAVR who is being seen today for the evaluation of bacteremia at the request of Dr. Sloan Leiter  1. Sepsis/Bacteremia 2/2 to acute cholecystitis: Overall clinically improved. Perc chole drain placed on 2/9 with [facility] following.  Remains on Zosyn and daptomycin. --Infectious disease following and request TEE to further assess AV prosthesis.  Has been discussed with the patient including risk and benefits as well as family and he is willing to proceed.  Scheduled for 3 PM tomorrow.  2.  Acute hypoxic respiratory failure: Initially required intubation, was extubated on 2/10 titrated to room air.  3.  Severe aortic stenosis s/p TAVR: TEE back in December presented with bacteremia which was negative for  any valve vegetation at that time.  Repeat TEE ordered this admission to reassess valve.  4. NICM: Underwent lead extraction back in December as vegetation was found on TEE.  He has been continued on amiodarone.  No reported arrhythmias, he is not currently on telemetry. --No significant signs of volume overload on exam.  For questions or updates, please contact [place] Please consult [email or web address] for contact info under   Signed, Reino Bellis, NP  11/25/2018 4:01 PM   Patient seen, examined. Available data reviewed. Agree with findings, assessment, and plan as outlined by Reino Bellis, NP.  The patient is independently interviewed and examined.  He is an elderly appearing male in no distress.  He sitting up in the bedside eating dinner with assistance.  He is not able to provide much history.  His daughter and caregiver are both at the bedside.  On my exam, he is an elderly gentleman in no distress, lung fields are clear, heart is distant and regular with no murmur or gallop.  JVP is normal, abdomen is soft and nontender, extremities have 1+ ankle edema bilaterally.  SCDs are in place.  Skin is warm and dry without rash.  The patient is now readmitted with severe sepsis secondary to acute cholecystitis and Enterobacter bacteremia.  A percutaneous drain has been placed.  He underwent TAVR in November 2019 with a 29 mm Edwards valve.  The following month he developed Klebsiella bacteremia and ICD lead infection requiring device explantation.  At that time the TAVR valve demonstrated no evidence of infection/vegetation.  He completed a full course of intravenous antibiotics through a PICC line which had subsequently been removed.  He now has Enterobacter bacteremia presumably related to cholecystitis.  Infectious disease notes are reviewed and recommendations have been made for a transesophageal echocardiogram.  This will be performed tomorrow.  Depending on the results, will be better able to  determine duration of antibiotics.  The patient is now clinically stable on IV antibiotics.  There is no obvious valvular dysfunction based on his surface echocardiogram which is reviewed today. Questions answered. Will FU after TEE completed. thx  Sherren Mocha, M.D. 11/25/2018 5:58 PM

## 2018-11-25 NOTE — Consult Note (Addendum)
Cardiology Consultation:   Patient ID: FARHAD BURLESON MRN: [id number]; DOB: 12-Oct-1928  Admit date: 11/21/2018 Date of Consult: 11/25/2018  Primary Care Provider: Haywood Pao, MD Primary Cardiologist: Sanda Klein, MD  Primary Electrophysiologist:  None    Patient Profile:   William [name] is a 85 y.o. male with a hx of AAA, NICM/chronic HF s/p ICD (now removed), VT, OSA, HLD, degenerative arthritis/lumber vertebral fracture with reduced mobility and severe AS s/p TAVR who is being seen today for the evaluation of bacteremia at the request of Dr. Sloan Leiter  History of Present Illness:   William [name] is an 85 yo male with of AAA, NICM/chronic HF s/p ICD (now removed), VT, OSA, HLD, degenerative arthritis/lumber vertebral fracture with reduced mobility and severe AS s/p TAVR.  William [name] most recently successfully underwent TAVR with a 29 mm Edwards Sapien 3 THV via the TF approach on 08/18/2018.  Postoperative echo showed EF of 30% with normal functioning TAVR with no perivalvular leak with a mean gradient of 9 mmHg.  William [name] was discharged home on aspirin and Plavix and initially did very well and felt much better after his TAVR.  William [name] was readmitted 11/12 through 09/21/2018 for ESBL bacteremia and Klebsiella pacemaker lead vegetation.  William [name] underwent device extraction on 09/15/2018 and was discharged on 6 weeks of IV antibiotics through a PICC line which was planned to be discontinued 10/27/2018.  Of note William [name] was seen in the ER on 10/10/2018 for head laceration repair after a fall.  William [name] was last seen in the clinic on 10/21/2018 by Nell Range for clinic follow-up.  William [name] reported to be recovering well at [facility] in tolerating rehab well.  William [name] was instructed to continue Plavix with discontinuation after 6 months of therapy on 02/2019.  SBE prophylaxis was discussed as well.  Rhythm was noted to be stable and William [name] was continued on amiodarone.  Echo done in the office that days showed no evidence of  vegetation.  This admission William [name] presented on 11/21/2018 with severe sepsis secondary to acute cholecystitis and Enterobacter Cloacae bacteremia and acute hypoxic respiratory failure requiring intubation.  William [name] was managed in the ICU, and extubated on 2/10 and stabilized with transfer to the hospitalist service on 2/12.  On 2/9 William [name] had a percutaneous Chole drain placed with surgery.  This has been maintained and William [name] has been followed by [facility].  Maintained on Zosyn and daptomycin.  Infectious disease was consulted and recommendation for TEE to reassess his AV prosthesis.  Past Medical History:  Diagnosis Date  . AAA (abdominal aortic aneurysm) ([place])    7/13 3.8cm  . Arthritis    "joints" (01/11/2014)  . Asthma    "seasonal; some foods"   . Chronic systolic CHF (congestive heart failure) ([place])   . Dementia ([place])   . HLD (hyperlipidemia)   . HOH (hard of hearing)   . Lumbar vertebral fracture ([facility])    L1- 04/11/2014   . Prostate cancer ([place])   . S/P ICD (internal cardiac defibrillator) procedure, 01/11/14 removal of ERI gen and placement of Medtronic Evera XT VR & NEW Right ventricular lead Medtronic 01/12/2014  . S/P TAVR (transcatheter aortic valve replacement)    Edwards Sapien 3 THV (size 29 mm, model # [id number], serial # [id number])  . Severe aortic stenosis   . Sleep apnea    "lost 60# & don't have it anymore" (01/11/2014)  . Ventricular tachycardia [facility])     Past Surgical History:  Procedure Laterality Date  . CARDIAC  CATHETERIZATION  08/20/2004   noncritical CAD,mild global hypokinesis, EF 50%  . CARDIAC DEFIBRILLATOR PLACEMENT  08/23/2004   Medtronic  . CATARACT EXTRACTION W/ INTRAOCULAR LENS IMPLANT Right   . COLECTOMY  1990's  . CRYOABLATION N/A 02/24/2014   Procedure: CRYO ABLATION PROSTATE;  Surgeon: Ailene Rud, MD;  Location: [facility] ORS;  Service: Urology;  Laterality: N/A;  . HERNIA REPAIR     "abdomen; from colon OR"  . ICD LEAD REMOVAL N/A 09/15/2018   Procedure: ICD LEAD  REMOVAL EXTRACTION;  Surgeon: Evans Lance, MD;  Location: [facility] OR;  Service: Cardiovascular;  Laterality: N/A;  DR. BARTLE TO BACK UP  . IMPLANTABLE CARDIOVERTER DEFIBRILLATOR (ICD) GENERATOR CHANGE N/A 01/11/2014   Procedure: ICD GENERATOR CHANGE;  Surgeon: Sanda Klein, MD;  Location: [place] CATH LAB;  Service: Cardiovascular;  Laterality: N/A;  . IR PERC CHOLECYSTOSTOMY  11/22/2018  . LEAD REVISION N/A 01/11/2014   Procedure: LEAD REVISION;  Surgeon: Sanda Klein, MD;  Location: [place] CATH LAB;  Service: Cardiovascular;  Laterality: N/A;  . NM MYOCAR PERF WALL MOTION  01/29/2012   abnormal c/o infarct/scar,no ischemia present  . PROSTATE BIOPSY N/A 11/22/2013   Procedure: PROSTATE BIOPSY AND ULTRASOUND;  Surgeon: Ailene Rud, MD;  Location: [facility];  Service: Urology;  Laterality: N/A;  . RIGHT/LEFT HEART CATH AND CORONARY ANGIOGRAPHY N/A 07/06/2018   Procedure: RIGHT/LEFT HEART CATH AND CORONARY ANGIOGRAPHY;  Surgeon: Troy Sine, MD;  Location: [place] CV LAB;  Service: Cardiovascular;  Laterality: N/A;  . TEE WITHOUT CARDIOVERSION N/A 08/18/2018   Procedure: TRANSESOPHAGEAL ECHOCARDIOGRAM (TEE);  Surgeon: Burnell Blanks, MD;  Location: [place];  Service: Open Heart Surgery;  Laterality: N/A;  . TEE WITHOUT CARDIOVERSION N/A 09/08/2018   Procedure: TRANSESOPHAGEAL ECHOCARDIOGRAM (TEE);  Surgeon: Lelon Perla, MD;  Location: [facility] ENDOSCOPY;  Service: Cardiovascular;  Laterality: N/A;  . TEE WITHOUT CARDIOVERSION N/A 09/15/2018   Procedure: TRANSESOPHAGEAL ECHOCARDIOGRAM (TEE);  Surgeon: Evans Lance, MD;  Location: [place];  Service: Cardiovascular;  Laterality: N/A;  . TRANSCATHETER AORTIC VALVE REPLACEMENT, TRANSFEMORAL  08/18/2018  . TRANSCATHETER AORTIC VALVE REPLACEMENT, TRANSFEMORAL N/A 08/18/2018   Procedure: TRANSCATHETER AORTIC VALVE REPLACEMENT, TRANSFEMORAL using an Edwards 40m Aortic Valve;  Surgeon: MBurnell Blanks MD;  Location: [place]  Service: Open  Heart Surgery;  Laterality: N/A;     Home Medications:  Prior to Admission medications   Medication Sig Start Date End Date Taking? Authorizing Provider  acetaminophen (TYLENOL) 500 MG tablet Take 500 mg by mouth every 6 (six) hours as needed (for pain).    Yes [provider]  Amino Acids-Protein Hydrolys (FEEDING SUPPLEMENT, PRO-STAT SUGAR FREE 64,) LIQD Take 30 mLs by mouth daily.   Yes [provider]  amiodarone (PACERONE) 200 MG tablet Take 0.5 tablets (100 mg total) by mouth daily. 11/16/18  Yes Croitoru, Mihai, MD  aspirin 81 MG chewable tablet Chew 1 tablet (81 mg total) by mouth daily. 07/08/18  Yes Kroeger, KLorelee Cover, PA-C  cholecalciferol (VITAMIN D) 1000 units tablet Take 2,000 Units by mouth at bedtime.   Yes [provider]  clopidogrel (PLAVIX) 75 MG tablet Take 1 tablet (75 mg total) by mouth daily with breakfast. 08/21/18  Yes TEileen [place] PA-C  ezetimibe-simvastatin (VYTORIN) 10-20 MG per tablet Take 1 tablet by mouth at bedtime.   Yes [provider]  metoprolol succinate (TOPROL-XL) 25 MG 24 hr tablet Take 1 tablet (25 mg total) by mouth daily. Take with or immediately  following a meal. 09/18/18  Yes Nita Sells, MD  Multiple Vitamins-Minerals (PRESERVISION AREDS 2 PO) Take 1 capsule by mouth daily.    Yes [provider]  polyethylene glycol (MIRALAX / GLYCOLAX) packet Take 17 g by mouth every evening. Patient taking differently: Take 1 g by mouth every evening. 1 teaspoon daily 01/21/17  Yes Croitoru, Mihai, MD  triamcinolone (KENALOG) 0.025 % cream Apply 1 application topically 2 (two) times daily. Until resolved   Yes [provider]    Inpatient Medications: Scheduled Meds: . amiodarone  100 mg Oral Daily  . aspirin  81 mg Oral Daily  . chlorhexidine gluconate (MEDLINE KIT)  15 mL Mouth Rinse BID  . [START ON 11/26/2018] clopidogrel  75 mg Oral Q breakfast  . heparin  5,000 Units Subcutaneous Q8H  .  insulin aspart  0-9 Units Subcutaneous TID AC & HS  . polyethylene glycol  17 g Oral Daily  . sodium chloride flush  10-40 mL Intracatheter Q12H  . sodium chloride flush  5 mL Intracatheter Q8H   Continuous Infusions: . sodium chloride Stopped (11/21/18 0959)  . sodium chloride    . piperacillin-tazobactam (ZOSYN)  IV 3.375 g (11/25/18 1301)   PRN Meds: [CANCELED] Place/Maintain arterial line **AND** sodium chloride, acetaminophen, bisacodyl, Melatonin, RESOURCE THICKENUP CLEAR, senna, sodium chloride flush  Allergies:    Allergies  Allergen Reactions  . Crestor [Rosuvastatin] Other (See Comments)    Hurts muscles  . Lipitor [Atorvastatin] Other (See Comments)    Hurts stomach  . Shrimp [Shellfish Allergy] Other (See Comments)    On MAR    Social History:   Social History   Socioeconomic History  . Marital status: Married    Spouse name: Not on file  . Number of children: 2  . Years of education: Not on file  . Highest education level: Not on file  Occupational History  . Occupation: Owned a Scientist, [employer]  Social Needs  . Financial resource strain: Not on file  . Food insecurity:    Worry: Not on file    Inability: Not on file  . Transportation needs:    Medical: Not on file    Non-medical: Not on file  Tobacco Use  . Smoking status: Never Smoker  . Smokeless tobacco: Never Used  Substance and Sexual Activity  . Alcohol use: Yes    Alcohol/week: 0.0 standard drinks    Comment: 01/11/2014 "drink 1/2 of a beer twice per month   . Drug use: No  . Sexual activity: Not Currently  Lifestyle  . Physical activity:    Days per week: Not on file    Minutes per session: Not on file  . Stress: Not on file  Relationships  . Social connections:    Talks on phone: Not on file    Gets together: Not on file    Attends religious service: Not on file    Active member of club or organization: Not on file    Attends meetings of clubs or organizations: Not on file     Relationship status: Not on file  . Intimate partner violence:    Fear of current or ex partner: Not on file    Emotionally abused: Not on file    Physically abused: Not on file    Forced sexual activity: Not on file  Other Topics Concern  . Not on file  Social History Narrative  . Not on file    Family History:    Family History  Problem Relation Age of Onset  . Heart failure Mother        Died of "old age" at 59  . Pneumonia Father      ROS:  Please see the history of present illness.   All other ROS reviewed and negative.     Physical Exam/Data:   Vitals:   11/24/18 2125 11/25/18 0513 11/25/18 0800 11/25/18 1100  BP: 102/67 109/72    Pulse: 90   (!) 129  Resp: (!) 23 19    Temp: 98.9 F (37.2 C) 98.4 F (36.9 C) 98 F (36.7 C)   TempSrc: Oral  Oral   SpO2: 97%     Weight:      Height:        Intake/Output Summary (Last 24 hours) at 11/25/2018 1601 Last data filed at 11/25/2018 1527 Gross per 24 hour  Intake 124.55 ml  Output 430 ml  Net -305.45 ml   Last 3 Weights 11/23/2018 11/22/2018 11/21/2018  Weight (lbs) 225 lb 12 oz 210 lb 1.6 oz 209 lb 7 oz  Weight (kg) 102.4 kg 95.3 kg 95 kg     Body mass index is 32.39 kg/m.  General:  Well nourished, well developed, in no acute distress, older WM HEENT: normal Lymph: no adenopathy Neck: no JVD Endocrine:  No thryomegaly Vascular: No carotid bruits; FA pulses 2+ bilaterally without bruits  Cardiac:  normal S1, S2; RRR; soft systolic murmur  Lungs:  clear to auscultation bilaterally, no wheezing, rhonchi or rales  Abd: soft, nontender, no hepatomegaly  Ext: no edema Musculoskeletal:  No deformities, BUE and BLE strength normal and equal Skin: warm and dry  Neuro:  CNs 2-12 intact, no focal abnormalities noted  EKG:  The EKG was personally reviewed and demonstrates:  ST Telemetry:  Telemetry was personally reviewed and demonstrates:  N/a  Relevant CV Studies:  TTE: 11/23/2018  IMPRESSIONS    1. The  left ventricle has severely reduced systolic function of 38-25%. The cavity size was normal. There is severely increased left ventricular wall thickness. Echo evidence of impaired diastolic relaxation.  2. The right ventricle has normal systolic function. The cavity was normal. There is no increase in right ventricular wall thickness.  3. Left atrial size was mildly dilated.  4. The mitral valve is normal in structure. There is mild thickening.  5. The tricuspid valve is normal in structure.  6. The aortic valve was not assessed There is mild thickening and mild calcification of the aortic valve. Aortic valve regurgitation is trivial by color flow Doppler.  7. AV prosthesis is difficult to see Peak and mean gradients across valve are 24 and 11 mm Hg respectively.  8. LVEF is severely depressed with diffuse hypokinesis; lateral akinesis.  Laboratory Data:  Chemistry Recent Labs  Lab 11/23/18 0355 11/23/18 0751 11/24/18 0423 11/25/18 0357  NA 141 142 142 139  K 3.1* 3.4* 2.9* 3.7  CL 113*  --  114* 108  CO2 20*  --  21* 25  GLUCOSE 147*  --  113* 135*  BUN 23  --  17 14  CREATININE 0.89  --  0.82 0.88  CALCIUM 7.4*  --  7.5* 7.6*  GFRNONAA >60  --  >60 >60  GFRAA >60  --  >60 >60  ANIONGAP 8  --  7 6    Recent Labs  Lab 11/23/18 0355 11/24/18 0423 11/25/18 0357  PROT 5.3* 4.8* 5.2*  ALBUMIN 1.7* 1.5* 1.7*  AST 46* 54* 65*  ALT 41 48* 58*  ALKPHOS 79 76 86  BILITOT 0.8 0.7 1.2   Hematology Recent Labs  Lab 11/23/18 0355 11/23/18 0751 11/24/18 0423 11/25/18 0357  WBC 6.4  --  4.9 5.7  RBC 3.26*  --  2.93* 3.37*  HGB 10.3* 15.0 9.1* 10.2*  HCT 31.0* 44.0 28.5* 32.9*  MCV 95.1  --  97.3 97.6  MCH 31.6  --  31.1 30.3  MCHC 33.2  --  31.9 31.0  RDW 14.6  --  14.5 14.2  PLT 105*  --  102* 123*   Cardiac Enzymes Recent Labs  Lab 11/21/18 0851 11/21/18 1350 11/21/18 1939  TROPONINI 0.06* 0.05* 0.04*   No results for input(s): TROPIPOC in the last 168 hours.    BNP Recent Labs  Lab 11/21/18 0851  BNP 332.5*    DDimer No results for input(s): DDIMER in the last 168 hours.  Radiology/Studies:  Ir Perc Cholecystostomy  Result Date: 11/23/2018 INDICATION: 85 year old male with a history of cholecystitis EXAM: CHOLECYSTOSTOMY MEDICATIONS: In-hospital antibiotics ANESTHESIA/SEDATION: Moderate (conscious) sedation was employed during this procedure. A total of Versed 2.0 mg and Fentanyl 0 mcg was administered intravenously. Moderate Sedation Time: 0 minutes. The patient's level of consciousness and vital signs were monitored continuously by radiology nursing throughout the procedure under my direct supervision. FLUOROSCOPY TIME:  Fluoroscopy Time: 0 minutes 24 seconds (42.6 mGy). COMPLICATIONS: None PROCEDURE: Informed written consent was obtained from the patient and the patient's family after a thorough discussion of the procedural risks, benefits and alternatives. All questions were addressed. Maximal Sterile Barrier Technique was utilized including caps, mask, sterile gowns, sterile gloves, sterile drape, hand hygiene and skin antiseptic. A timeout was performed prior to the initiation of the procedure. Ultrasound survey of the right upper quadrant was performed for planning purposes. Once the patient is prepped and draped in the usual sterile fashion, the skin and subcutaneous tissues overlying the gallbladder were generously infiltrated 1% lidocaine for local anesthesia. A coaxial needle was advanced under ultrasound guidance through the skin subcutaneous tissues and a small segment of liver into the gallbladder lumen. With removal of the stylet, spontaneous dark bile drainage occurred. Using modified Seldinger technique, a 10 French drain was placed into the gallbladder fossa, with aspiration of the sample for the lab. Contrast injection confirmed position of the tube within the gallbladder lumen. Drainage catheter was attached to gravity drain with a suture  retention placed. Patient tolerated the procedure well and remained hemodynamically stable throughout. No complications were encountered and no significant blood loss encountered. IMPRESSION: Status post image guided percutaneous cholecystostomy. Signed, Dulcy Fanny. Dellia Nims, RPVI [facility] Radiology Electronically Signed   By: Corrie Mckusick D.O.   On: 11/23/2018 08:16   Dg Chest Port 1 View  Result Date: 11/23/2018 CLINICAL DATA:  Acute respiratory failure EXAM: PORTABLE CHEST 1 VIEW COMPARISON:  Two days ago FINDINGS: Endotracheal tube tip between the clavicular heads and carina. The orogastric tube reaches the stomach. Low volume chest with streaky density at the bases. There may be trace pleural fluid. No Kerley lines or pneumothorax. Cardiomegaly. Transcatheter aortic valve replacement IMPRESSION: Stable hardware positioning, low lung volumes, and presumed atelectasis. Electronically Signed   By: Monte Fantasia M.D.   On: 11/23/2018 06:53    Assessment and Plan:   William [name] is a 85 y.o. male with a hx of AAA, NICM/chronic HF s/p ICD (now removed), VT, OSA, HLD, degenerative arthritis/lumber vertebral fracture with reduced mobility  and severe AS s/p TAVR who is being seen today for the evaluation of bacteremia at the request of Dr. Sloan Leiter  1. Sepsis/Bacteremia 2/2 to acute cholecystitis: Overall clinically improved. Perc chole drain placed on 2/9 with [facility] following.  Remains on Zosyn and daptomycin. --Infectious disease following and request TEE to further assess AV prosthesis.  Has been discussed with the patient including risk and benefits as well as family and William [name] is willing to proceed.  Scheduled for 3 PM tomorrow.  2.  Acute hypoxic respiratory failure: Initially required intubation, was extubated on 2/10 titrated to room air.  3.  Severe aortic stenosis s/p TAVR: TEE back in December presented with bacteremia which was negative for  any valve vegetation at that time.  Repeat TEE ordered this admission to reassess valve.  4. NICM: Underwent lead extraction back in December as vegetation was found on TEE.  William [name] has been continued on amiodarone.  No reported arrhythmias, William [name] is not currently on telemetry. --No significant signs of volume overload on exam.  For questions or updates, please contact [place] Please consult [email or web address] for contact info under   Signed, Reino Bellis, NP  11/25/2018 4:01 PM   Patient seen, examined. Available data reviewed. Agree with findings, assessment, and plan as outlined by Reino Bellis, NP.  The patient is independently interviewed and examined.  William [name] is an elderly appearing male in no distress.  William [name] sitting up in the bedside eating dinner with assistance.  William [name] is not able to provide much history.  His daughter and caregiver are both at the bedside.  On my exam, William [name] is an elderly gentleman in no distress, lung fields are clear, heart is distant and regular with no murmur or gallop.  JVP is normal, abdomen is soft and nontender, extremities have 1+ ankle edema bilaterally.  SCDs are in place.  Skin is warm and dry without rash.  The patient is now readmitted with severe sepsis secondary to acute cholecystitis and Enterobacter bacteremia.  A percutaneous drain has been placed.  William [name] underwent TAVR in November 2019 with a 29 mm Edwards valve.  The following month William [name] developed Klebsiella bacteremia and ICD lead infection requiring device explantation.  At that time the TAVR valve demonstrated no evidence of infection/vegetation.  William [name] completed a full course of intravenous antibiotics through a PICC line which had subsequently been removed.  William [name] now has Enterobacter bacteremia presumably related to cholecystitis.  Infectious disease notes are reviewed and recommendations have been made for a transesophageal echocardiogram.  This will be performed tomorrow.  Depending on the results, will be better able to  determine duration of antibiotics.  The patient is now clinically stable on IV antibiotics.  There is no obvious valvular dysfunction based on his surface echocardiogram which is reviewed today. Questions answered. Will FU after TEE completed. thx  Sherren Mocha, M.D. 11/25/2018 5:58 PM

## 2018-11-25 NOTE — Evaluation (Addendum)
Physical Therapy Evaluation Patient Details Name: William [name] MRN: [id number] DOB: Jun 01, 1928 Today's Date: 11/25/2018   History of Present Illness  William [name] is an 65ZC male who comes to [facility] from [place] [facility] [place] after 15hrs fever, chills, emesis, AMS, required intubation, extubated on 2/10. Pt found to have bactremia, as well as cholagitis, s/p percutaneous chole drain placement 11/22/08. PTA pt has been at [facility] for STR x8 weeks, had progressed to the poitn of AMB 5-7 steps per bout, but still not back to baseline of household distance AMB (prior to Novemebr 2019).   Clinical Impression  Pt admitted with above diagnosis. Pt currently with functional limitations due to the deficits listed below (see "PT Problem List"). Upon entry, pt in bed, DTR, wife present. The pt is awake, confused, and reluctant to participate, but easily encouraged by family. The pt is alert and oriented to self, pleasant, minimally conversational, and following simple commands intermittently, although he is so weak he has difficulty performing tasks he attempts. TotalAssist +2 for all bed mobility to/from EOB. Pt requires heavy physical assist to obtain upright sitting position, constant retropulsive lean, even with BUE assist. Upon sitting, HR increases to high 110s and after 6-8 minutes, HR remains steadily 128-129bpm, pt then returned to supine. Pt has difficulty with activation of legs for bed exercise, Rt>Lt. Pt is Right handed but selectively avoiding use as he injured his Rt hand 8 weeks ago, still has pain. Functional mobility assessment demonstrates increased effort/time requirements, poor tolerance, and need for physical assistance, whereas the patient performed these at a higher level of independence PTA. Family is hopeful for a more rapid progression of recovery, and advocates for higher frequency/intensity of rehab services, however I suspect their expectations may not be entirely congruent with the  patient's prognosis, as I suspect he will have require a longer amount of time to return to baseline. Pt will benefit from skilled PT intervention to increase independence and safety with basic mobility in preparation for discharge to the venue listed below.       Follow Up Recommendations SNF(return to STR; pt inquiring about CIR, but I do not think he would currently tolerate 3 hours daily. )    Equipment Recommendations  None recommended by PT    Recommendations for Other Services       Precautions / Restrictions Precautions Precautions: Fall Restrictions Weight Bearing Restrictions: No      Mobility  Bed Mobility Overal bed mobility: Needs Assistance Bed Mobility: Supine to Sit;Sit to Supine     Supine to sit: +2 for physical assistance;Total assist Sit to supine: Total assist;+2 for physical assistance   General bed mobility comments: pt attempting, but unable to move legs despite attempts  Transfers                 General transfer comment: not appropriate at this time, pt strugglign to sit unsupported, over 5 minutes, HR increased from low 120s to high 120s.   Ambulation/Gait                Stairs            Wheelchair Mobility    Modified Rankin (Stroke Patients Only)       Balance Overall balance assessment: Needs assistance Sitting-balance support: Bilateral upper extremity supported;Single extremity supported;Feet supported Sitting balance-Leahy Scale: Zero Sitting balance - Comments: able to sit unsupported for ~1-2 seconds.  Postural control: Posterior lean;Right lateral lean  Pertinent Vitals/Pain Pain Assessment: Faces Faces Pain Scale: Hurts even more Pain Location: back is hurting, improves after moving to sitting EOB  Pain Descriptors / Indicators: Aching Pain Intervention(s): Limited activity within patient's tolerance;Monitored during session;Repositioned    Home Living  Family/patient expects to be discharged to:: Skilled nursing facility(@rehab  faciity [facility] 1 hour daily x8 weeks, previously at home)                 Additional Comments: pt's family assisting with providing PLOF    Prior Function Level of Independence: Needs assistance   Gait / Transfers Assistance Needed: Supervision with use of RW for limited household ambulation; Pt immediately prior to admission AMB in hallway   ADL's / Homemaking Assistance Needed: [place] for ADL in november, wears depends 2/2 PrCA.        Hand Dominance   Dominant Hand: Right    Extremity/Trunk Assessment   Upper Extremity Assessment Upper Extremity Assessment: Generalized weakness    Lower Extremity Assessment Lower Extremity Assessment: Generalized weakness    Cervical / Trunk Assessment Cervical / Trunk Assessment: (struggles to sit independently at EOB)  Communication   Communication: HOH  Cognition Arousal/Alertness: Awake/alert   Overall Cognitive Status: Impaired/Different from baseline Area of Impairment: Orientation;Following commands;Memory;Problem solving                 Orientation Level: Person   Memory: Decreased recall of precautions;Decreased short-term memory Following Commands: Follows one step commands consistently     Problem Solving: Slow processing;Requires tactile cues;Requires verbal cues;Decreased initiation        General Comments      Exercises Other Exercises Other Exercises: sitting EOB isometric upright trunk stabilization 15x2secHold, heavy tactle cues to achieve positiong, needs extensive rest between efforts.  Other Exercises: Supine heel slides 1x10 bilat: maxA required with RLE, minA required Left LE;  Other Exercises: RLE single leg press (supine, manually resisted) 1x10 (difficulty with activity)   Assessment/Plan    PT Assessment Patient needs continued PT services  PT Problem List Decreased strength;Decreased activity  tolerance;Decreased balance;Decreased mobility;Decreased cognition       PT Treatment Interventions Gait training;Functional mobility training;Therapeutic activities;Patient/family education;Balance training;Therapeutic exercise    PT Goals (Current goals can be found in the Care Plan section)  Acute Rehab PT Goals Patient Stated Goal: return to independent household distance AMB  PT Goal Formulation: With family Time For Goal Achievement: 12/09/18 Potential to Achieve Goals: Fair    Frequency Min 3X/week   Barriers to discharge        Co-evaluation               AM-PAC PT "6 Clicks" Mobility  Outcome Measure Help needed turning from your back to your side while in a flat bed without using bedrails?: Total Help needed moving from lying on your back to sitting on the side of a flat bed without using bedrails?: Total Help needed moving to and from a bed to a chair (including a wheelchair)?: Total Help needed standing up from a chair using your arms (e.g., wheelchair or bedside chair)?: Total Help needed to walk in hospital room?: Total Help needed climbing 3-5 steps with a railing? : Total 6 Click Score: 6    End of Session   Activity Tolerance: Patient limited by fatigue;Treatment limited secondary to medical complications (Comment)(HR in 120s seated, evetually around 129bpm. ) Patient left: in bed;with family/visitor present;with call bell/phone within reach(sitting upright) Nurse Communication: Mobility status PT Visit Diagnosis: Difficulty in walking,  not elsewhere classified (R26.2);Unsteadiness on feet (R26.81);Muscle weakness (generalized) (M62.81);Other abnormalities of gait and mobility (R26.89)    Time: 4010-2725 PT Time Calculation (min) (ACUTE ONLY): 42 min   Charges:   PT Evaluation $PT Eval High Complexity: 1 High PT Treatments $Therapeutic Exercise: 23-37 mins        1:47 PM, 11/25/18 Etta Grandchild, PT, DPT Physical Therapist - [place] [phone] (Pager)  [phone] (Office)      Shaquavia Whisonant C 11/25/2018, 1:40 PM

## 2018-11-25 NOTE — Progress Notes (Addendum)
PROGRESS NOTE        PATIENT DETAILS Name: William [name] Age: 85 y.o. Sex: male Date of Birth: 1928-01-18 Admit Date: 11/21/2018 Admitting Physician Rigoberto Noel, MD HCW:[id number], Fransico Him, MD  Brief Narrative: Patient is a 85 y.o. 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  Managed in the ICU, extubated on 2/10-stabilized and transferred to the triad hospitalist service on 2/12.  See below for further details.  Significant events: 11/21/2018 admitted to the hospital for respiratory failure 2/9: Percutaneous drain placed 2/10: Successfully extubated, changed to daptomycin 2/12: Transferred to the triad hospitalist service   Subjective: Apparently has not slept well in 2 days.  Denies any chest pain or shortness of breath.  Assessment/Plan: Severe sepsis secondary to acute cholecystitis and Enterobacter bacteremia: Sepsis pathophysiology has improved, s/p percutaneous cholecystostomy drain by IR on 12/9.  Blood cultures positive for Enterobacter cloacae, bile cultures for enterococcus and Enterobacter.  Awaiting final sensitivity data, in the meantime continue with Zosyn and daptomycin.  ID and CCS following.  Acute metabolic encephalopathy: Secondary to above-suspect may have some cognitive dysfunction/dementia at baseline.  He was awake and alert this morning-able to answer most of my questions appropriately.  Acute hypoxic respiratory failure: Extubated on 2/10-titrate off oxygen to room air.  History of ventricular tachycardia had AICD in place but this was explanted in December 2019 for ESBL  producing Klebsiella lead endocarditis: Continue telemetry monitoring-continue amiodarone.  History of severe aortic stenosis s/p TAVR in November 2019: TTE on 2/10 with difficult to visualize AV prosthesis-discussed with ID-recommendations are to discuss with cardiology if patient requires a TEE due to recurrent gram-negative bacteremia.  Have consulted cardiology-await further recommendations.  Resume Plavix  Chronic systolic heart failure (EF 25-30% by TTE on 11/23/2018): Volume status appears relatively stable-follow.  Severe deconditioning/debility: Secondary to acute illness-and numerous recent hospitalizations-await PT evaluation.  Attempt to ambulate.  AAA (4.6 x 4 cm infrarenal): Stable for outpatient follow-up.  Multiple small pulmonary nodules throughout the lung seen on CT chest on 08/03/2018: Stable for outpatient follow-up  DVT Prophylaxis: Prophylactic Heparin   Code Status: Full code  Family Communication: Daughter at at bedside  Disposition Plan: Remain inpatient-probably will require SNF later this week  Antimicrobial agents: Anti-infectives (From admission, onward)   Start     Dose/Rate Route Frequency Ordered Stop   11/24/18 2100  piperacillin-tazobactam (ZOSYN) IVPB 3.375 g     3.375 g 12.5 mL/hr over 240 Minutes Intravenous Every 8 hours 11/24/18 1639     11/23/18 2000  DAPTOmycin (CUBICIN) 700 mg in sodium chloride 0.9 % IVPB  Status:  Discontinued     700 mg 228 mL/hr over 30 Minutes Intravenous Daily 11/23/18 1353 11/25/18 1144   11/22/18 0800  vancomycin (VANCOCIN) 1,500 mg in sodium chloride 0.9 % 500 mL IVPB  Status:  Discontinued     1,500 mg 250 mL/hr over 120 Minutes Intravenous Every 24 hours 11/21/18 0645 11/23/18 1334   11/21/18 1400  meropenem (MERREM) 1 g in sodium chloride 0.9 % 100 mL IVPB  Status:  Discontinued  1 g 200 mL/hr over 30 Minutes Intravenous Every 8 hours 11/21/18 0645 11/24/18 1632   11/21/18 0545  meropenem (MERREM) 1 g in  sodium chloride 0.9 % 100 mL IVPB     1 g 200 mL/hr over 30 Minutes Intravenous STAT 11/21/18 0541 11/21/18 0648   11/21/18 0530  ceFEPIme (MAXIPIME) 2 g in sodium chloride 0.9 % 100 mL IVPB  Status:  Discontinued     2 g 200 mL/hr over 30 Minutes Intravenous  Once 11/21/18 0526 11/21/18 0535   11/21/18 0530  metroNIDAZOLE (FLAGYL) IVPB 500 mg  Status:  Discontinued     500 mg 100 mL/hr over 60 Minutes Intravenous Every 8 hours 11/21/18 0526 11/21/18 0535   11/21/18 0530  vancomycin (VANCOCIN) IVPB 1000 mg/200 mL premix  Status:  Discontinued     1,000 mg 200 mL/hr over 60 Minutes Intravenous  Once 11/21/18 0526 11/21/18 0528   11/21/18 0530  vancomycin (VANCOCIN) 2,000 mg in sodium chloride 0.9 % 500 mL IVPB     2,000 mg 250 mL/hr over 120 Minutes Intravenous  Once 11/21/18 0528 11/21/18 0925      Procedures: OETT 2/8 > 2/10 Right radial A-line 2/8 > discontinued  CONSULTS:  cardiology, pulmonary/intensive care, ID and general surgery  Time spent: 25- minutes-Greater than 50% of this time was spent in counseling, explanation of diagnosis, planning of further management, and coordination of care.  MEDICATIONS: Scheduled Meds: . amiodarone  100 mg Oral Daily  . aspirin  81 mg Oral Daily  . chlorhexidine gluconate (MEDLINE KIT)  15 mL Mouth Rinse BID  . docusate sodium  100 mg Oral BID  . heparin  5,000 Units Subcutaneous Q8H  . insulin aspart  0-9 Units Subcutaneous TID AC & HS  . polyethylene glycol  17 g Oral Daily  . sodium chloride flush  10-40 mL Intracatheter Q12H  . sodium chloride flush  5 mL Intracatheter Q8H   Continuous Infusions: . sodium chloride Stopped (11/21/18 0959)  . sodium chloride    . piperacillin-tazobactam (ZOSYN)  IV 3.375 g (11/25/18 1301)   PRN Meds:.[CANCELED] Place/Maintain arterial line **AND** sodium chloride, acetaminophen, Melatonin, RESOURCE THICKENUP CLEAR, sodium chloride flush   PHYSICAL EXAM: Vital signs: Vitals:   11/24/18 1723  11/24/18 2125 11/25/18 0513 11/25/18 0800  BP:  102/67 109/72   Pulse:  90    Resp:  (!) 23 19   Temp:  98.9 F (37.2 C) 98.4 F (36.9 C) 98 F (36.7 C)  TempSrc:  Oral  Oral  SpO2:  97%    Weight:      Height: '5\' 10"'  (1.778 m)      Filed Weights   11/21/18 0512 11/22/18 0500 11/23/18 0340  Weight: 95 kg 95.3 kg 102.4 kg   Body mass index is 32.39 kg/m.   General appearance :Awake, alert, not in any distress.  HEENT: Atraumatic and Normocephalic Neck: supple Resp:Good air entry bilaterally, no added sounds  CVS: S1 S2 regular GI: Bowel sounds present, soft-mildly tender in the right upper quadrant Extremities: B/L Lower Ext shows no edema, both legs are warm to touch Musculoskeletal:No digital cyanosis Skin:No Rash, warm and dry Wounds:N/A  I have personally reviewed following labs and imaging studies  LABORATORY DATA: CBC: Recent Labs  Lab 11/21/18 0521  11/21/18 0851  11/22/18 0329 11/22/18 0518 11/23/18 0355 11/23/18 0751 11/24/18 0423 11/25/18 0357  WBC 14.1*  --  13.7*  --  7.7  --  6.4  --  4.9 5.7  NEUTROABS 12.6*  --   --   --   --   --   --   --   --   --   HGB 12.3*   < > 11.4*   < > 10.0* 19.7* 10.3* 15.0 9.1* 10.2*  HCT 36.3*   < > 33.5*   < > 30.4* 58.0* 31.0* 44.0 28.5* 32.9*  MCV 93.3  --  93.1  --  93.5  --  95.1  --  97.3 97.6  PLT 163  --  127*  --  107*  --  105*  --  102* 123*   < > = values in this interval not displayed.    Basic Metabolic Panel: Recent Labs  Lab 11/21/18 1350 11/22/18 0329  11/22/18 1229 11/22/18 1645 11/23/18 0355 11/23/18 0751 11/24/18 0423 11/25/18 0357  NA  --  140   < > 139  --  141 142 142 139  K  --  3.3*   < > 3.4*  --  3.1* 3.4* 2.9* 3.7  CL  --  113*  --  113*  --  113*  --  114* 108  CO2  --  19*  --  20*  --  20*  --  21* 25  GLUCOSE  --  153*  --  181*  --  147*  --  113* 135*  BUN  --  23  --  24*  --  23  --  17 14  CREATININE  --  0.89  --  0.89  --  0.89  --  0.82 0.88  CALCIUM  --  7.6*   --  7.7*  --  7.4*  --  7.5* 7.6*  MG 1.8 1.9  --   --  2.2 2.1  --   --  2.0  PHOS 2.3* 2.3*  --   --  2.9 2.3*  --   --  2.2*   < > = values in this interval not displayed.    GFR: Estimated Creatinine Clearance: 68.3 mL/min (by C-G formula based on SCr of 0.88 mg/dL).  Liver Function Tests: Recent Labs  Lab 11/21/18 0851 11/22/18 1229 11/23/18 0355 11/24/18 0423 11/25/18 0357  AST 25 36 46* 54* 65*  ALT 21 32 41 48* 58*  ALKPHOS 87 75 79 76 86  BILITOT 1.4* 0.9 0.8 0.7 1.2  PROT 5.8* 5.3* 5.3* 4.8* 5.2*  ALBUMIN 2.0* 1.6* 1.7* 1.5* 1.7*   Recent Labs  Lab 11/21/18 0851 11/22/18 1229  LIPASE 20 18  AMYLASE 10* 8*   No results for input(s): AMMONIA in the last 168 hours.  Coagulation Profile: Recent Labs  Lab 11/21/18 0521 11/21/18 0851 11/22/18 1229  INR 1.20 1.33 1.39    Cardiac Enzymes: Recent Labs  Lab 11/21/18 0851 11/21/18 1350 11/21/18 1939  TROPONINI 0.06* 0.05* 0.04*    BNP (last 3 results) No results for input(s): PROBNP in the last 8760 hours.  HbA1C: No results for input(s): HGBA1C in the last 72 hours.  CBG: Recent Labs  Lab 11/24/18 1231 11/24/18 1612 11/24/18 2134 11/25/18 0755 11/25/18 1141  GLUCAP 117* 116* 128* 110* 152*    Lipid Profile: No results for input(s): CHOL, HDL, LDLCALC, TRIG, CHOLHDL, LDLDIRECT in the last 72 hours.  Thyroid Function Tests: No results for input(s): TSH, T4TOTAL, FREET4, T3FREE, THYROIDAB in the last 72 hours.  Anemia Panel: No results for input(s): VITAMINB12, FOLATE, FERRITIN, TIBC, IRON, RETICCTPCT in the last 72 hours.  Urine analysis:  Component Value Date/Time   COLORURINE YELLOW (A) 11/21/2018 0805   APPEARANCEUR CLOUDY (A) 11/21/2018 0805   LABSPEC >1.030 (H) 11/21/2018 0805   PHURINE 6.0 11/21/2018 0805   GLUCOSEU NEGATIVE 11/21/2018 0805   HGBUR SMALL (A) 11/21/2018 0805   BILIRUBINUR NEGATIVE 11/21/2018 0805   KETONESUR NEGATIVE 11/21/2018 0805   PROTEINUR 30 (A)  11/21/2018 0805   NITRITE NEGATIVE 11/21/2018 0805   LEUKOCYTESUR SMALL (A) 11/21/2018 0805    Sepsis Labs: Lactic Acid, Venous    Component Value Date/Time   LATICACIDVEN 1.2 11/23/2018 0355    MICROBIOLOGY: Recent Results (from the past 240 hour(s))  Culture, blood (Routine x 2)     Status: Abnormal   Collection Time: 11/21/18  5:21 AM  Result Value Ref Range Status   Specimen Description BLOOD RIGHT ANTECUBITAL  Final   Special Requests   Final    BOTTLES DRAWN AEROBIC AND ANAEROBIC Blood Culture adequate volume   Culture  Setup Time   Final    GRAM NEGATIVE RODS IN BOTH AEROBIC AND ANAEROBIC BOTTLES CRITICAL RESULT CALLED TO, READ BACK BY AND VERIFIED WITH: B MANCHERIL,PHARMD AT 2014 11/21/2018 BY L BENFIELD    Culture (A)  Final    ENTEROBACTER CLOACAE CORRECTED ON 02/10 AT 0920: PREVIOUSLY REPORTED AS ESCHERICHIA COLI CORRECTED RESULTS PREVIOUSLY REPORTED AS: ESCHERICHIA COLI CORRECTED RESULTS CALLED TO: RN Oliver Hum [id number] MLM Performed at [facility], [place] [address], [place], [place] [address]    Report Status 11/23/2018 FINAL  Final   Organism ID, Bacteria ENTEROBACTER CLOACAE  Final      Susceptibility   Enterobacter cloacae - MIC*    AMPICILLIN 16 INTERMEDIATE Intermediate     CEFAZOLIN >=64 RESISTANT Resistant     CEFEPIME <=1 SENSITIVE Sensitive     CEFTAZIDIME <=1 SENSITIVE Sensitive     CEFTRIAXONE <=1 SENSITIVE Sensitive     CIPROFLOXACIN <=0.25 SENSITIVE Sensitive     GENTAMICIN <=1 SENSITIVE Sensitive     IMIPENEM 0.5 SENSITIVE Sensitive     TRIMETH/SULFA <=20 SENSITIVE Sensitive     AMPICILLIN/SULBACTAM 8 SENSITIVE Sensitive     PIP/TAZO <=4 SENSITIVE Sensitive     Extended ESBL NEGATIVE Sensitive     * ENTEROBACTER CLOACAE CORRECTED ON 02/10 AT 0920: PREVIOUSLY REPORTED AS ESCHERICHIA COLI  Blood Culture ID Panel (Reflexed)     Status: Abnormal   Collection Time: 11/21/18  5:21 AM  Result Value Ref Range Status   Enterococcus species  NOT DETECTED NOT DETECTED Final   Listeria monocytogenes NOT DETECTED NOT DETECTED Final   Staphylococcus species NOT DETECTED NOT DETECTED Final   Staphylococcus aureus (BCID) NOT DETECTED NOT DETECTED Final   Streptococcus species NOT DETECTED NOT DETECTED Final   Streptococcus agalactiae NOT DETECTED NOT DETECTED Final   Streptococcus pneumoniae NOT DETECTED NOT DETECTED Final   Streptococcus pyogenes NOT DETECTED NOT DETECTED Final   Acinetobacter baumannii NOT DETECTED NOT DETECTED Final   Enterobacteriaceae species DETECTED (A) NOT DETECTED Final    Comment: Enterobacteriaceae represent a large family of gram-negative bacteria, not a single organism. CRITICAL RESULT CALLED TO, READ BACK BY AND VERIFIED WITH: B MANCHERIL,PHARMD AT 2014 11/21/2018 BY L BENFIELD    Enterobacter cloacae complex DETECTED (A) NOT DETECTED Final    Comment: CRITICAL RESULT CALLED TO, READ BACK BY AND VERIFIED WITH: B MANCHERIL,PHARMD AT 2014 11/21/2018 BY L BENFIELD    Escherichia coli NOT DETECTED NOT DETECTED Final   Klebsiella oxytoca NOT DETECTED NOT DETECTED Final  Klebsiella pneumoniae NOT DETECTED NOT DETECTED Final   Proteus species NOT DETECTED NOT DETECTED Final   Serratia marcescens NOT DETECTED NOT DETECTED Final   Carbapenem resistance NOT DETECTED NOT DETECTED Final   Haemophilus influenzae NOT DETECTED NOT DETECTED Final   Neisseria meningitidis NOT DETECTED NOT DETECTED Final   Pseudomonas aeruginosa NOT DETECTED NOT DETECTED Final   Candida albicans NOT DETECTED NOT DETECTED Final   Candida glabrata NOT DETECTED NOT DETECTED Final   Candida krusei NOT DETECTED NOT DETECTED Final   Candida parapsilosis NOT DETECTED NOT DETECTED Final   Candida tropicalis NOT DETECTED NOT DETECTED Final    Comment: Performed at [facility], [place] [address] [place], [place] [address]  Culture, blood (Routine x 2)     Status: Abnormal   Collection Time: 11/21/18  5:22 AM  Result Value Ref Range  Status   Specimen Description BLOOD BLOOD LEFT FOREARM  Final   Special Requests   Final    BOTTLES DRAWN AEROBIC AND ANAEROBIC Blood Culture adequate volume   Culture  Setup Time   Final    GRAM NEGATIVE RODS IN BOTH AEROBIC AND ANAEROBIC BOTTLES CRITICAL VALUE NOTED.  VALUE IS CONSISTENT WITH PREVIOUSLY REPORTED AND CALLED VALUE.    Culture (A)  Final    ENTEROBACTER CLOACAE SUSCEPTIBILITIES PERFORMED ON PREVIOUS CULTURE WITHIN THE LAST 5 DAYS. Performed at [facility], [place] [address] [place], [place] [address]    Report Status 11/23/2018 FINAL  Final  Culture, Urine     Status: Abnormal   Collection Time: 11/21/18  8:05 AM  Result Value Ref Range Status   Specimen Description URINE, CATHETERIZED  Final   Special Requests   Final    Normal Performed at [facility], [place] [address], [place], [place] [address]    Culture >=100,000 COLONIES/mL YEAST (A)  Final   Report Status 11/22/2018 FINAL  Final  Culture, blood (routine x 2)     Status: None (Preliminary result)   Collection Time: 11/21/18  9:00 AM  Result Value Ref Range Status   Specimen Description BLOOD RIGHT HAND  Final   Special Requests   Final    BOTTLES DRAWN AEROBIC ONLY Blood Culture results may not be optimal due to an excessive volume of blood received in culture bottles   Culture   Final    NO GROWTH 4 DAYS Performed at [facility], [place] [address], [place], [place] [address]    Report Status PENDING  Incomplete  Culture, blood (routine x 2)     Status: None (Preliminary result)   Collection Time: 11/21/18  9:10 AM  Result Value Ref Range Status   Specimen Description BLOOD RIGHT WRIST  Final   Special Requests   Final    BOTTLES DRAWN AEROBIC ONLY Blood Culture results may not be optimal due to an inadequate volume of blood received in culture bottles   Culture   Final    NO GROWTH 4 DAYS Performed at [facility], [place] [address] [place], [place] [address]    Report Status  PENDING  Incomplete  Respiratory Panel by PCR     Status: None   Collection Time: 11/21/18  9:18 AM  Result Value Ref Range Status   Adenovirus NOT DETECTED NOT DETECTED Final   Coronavirus 229E NOT DETECTED NOT DETECTED Final    Comment: (NOTE) The Coronavirus on the Respiratory Panel, DOES NOT test for the novel  Coronavirus (2019 nCoV)  Coronavirus HKU1 NOT DETECTED NOT DETECTED Final   Coronavirus NL63 NOT DETECTED NOT DETECTED Final   Coronavirus OC43 NOT DETECTED NOT DETECTED Final   Metapneumovirus NOT DETECTED NOT DETECTED Final   Rhinovirus / Enterovirus NOT DETECTED NOT DETECTED Final   Influenza A NOT DETECTED NOT DETECTED Final   Influenza B NOT DETECTED NOT DETECTED Final   Parainfluenza Virus 1 NOT DETECTED NOT DETECTED Final   Parainfluenza Virus 2 NOT DETECTED NOT DETECTED Final   Parainfluenza Virus 3 NOT DETECTED NOT DETECTED Final   Parainfluenza Virus 4 NOT DETECTED NOT DETECTED Final   Respiratory Syncytial Virus NOT DETECTED NOT DETECTED Final   Bordetella pertussis NOT DETECTED NOT DETECTED Final   Chlamydophila pneumoniae NOT DETECTED NOT DETECTED Final   Mycoplasma pneumoniae NOT DETECTED NOT DETECTED Final    Comment: Performed at [facility], [place] [address], [place], [place] [address]  MRSA PCR Screening     Status: None   Collection Time: 11/21/18  9:18 AM  Result Value Ref Range Status   MRSA by PCR NEGATIVE NEGATIVE Final    Comment:        The GeneXpert MRSA Assay (FDA approved for NASAL specimens only), is one component of a comprehensive MRSA colonization surveillance program. It is not intended to diagnose MRSA infection nor to guide or monitor treatment for MRSA infections. Performed at [facility], [place] [address], [place], [place] [address]   Culture, respiratory (non-expectorated)     Status: None   Collection Time: 11/21/18  4:42 PM  Result Value Ref Range Status   Specimen Description TRACHEAL ASPIRATE  Final    Special Requests Normal  Final   Gram Stain   Final    RARE WBC PRESENT, PREDOMINANTLY PMN RARE GRAM POSITIVE COCCI    Culture   Final    FEW Consistent with normal respiratory flora. Performed at [facility], [place] [address], [place], [place] [address]    Report Status 11/24/2018 FINAL  Final  Aerobic/Anaerobic Culture (surgical/deep wound)     Status: None (Preliminary result)   Collection Time: 11/22/18  2:56 PM  Result Value Ref Range Status   Specimen Description BILE  Final   Special Requests NONE  Final   Gram Stain   Final    FEW WBC PRESENT, PREDOMINANTLY PMN MODERATE GRAM NEGATIVE RODS RARE GRAM POSITIVE COCCI IN PAIRS RARE GRAM POSITIVE RODS Performed at [facility], [place] [address], [place], [place] [address]    Culture   Final    MODERATE ENTEROBACTER ASBURIAE MODERATE ENTEROCOCCUS FAECIUM NO ANAEROBES ISOLATED; CULTURE IN PROGRESS FOR 5 DAYS    Report Status PENDING  Incomplete   Organism ID, Bacteria ENTEROBACTER ASBURIAE  Final   Organism ID, Bacteria ENTEROCOCCUS FAECIUM  Final      Susceptibility   Enterobacter asburiae - MIC*    CEFAZOLIN >=64 RESISTANT Resistant     CEFEPIME <=1 SENSITIVE Sensitive     CEFTAZIDIME <=1 SENSITIVE Sensitive     CEFTRIAXONE <=1 SENSITIVE Sensitive     CIPROFLOXACIN <=0.25 SENSITIVE Sensitive     GENTAMICIN <=1 SENSITIVE Sensitive     IMIPENEM 0.5 SENSITIVE Sensitive     TRIMETH/SULFA <=20 SENSITIVE Sensitive     PIP/TAZO <=4 SENSITIVE Sensitive     * MODERATE ENTEROBACTER ASBURIAE   Enterococcus faecium - MIC*    AMPICILLIN <=2 SENSITIVE Sensitive     VANCOMYCIN <=0.5 SENSITIVE Sensitive     GENTAMICIN SYNERGY SENSITIVE Sensitive     *  MODERATE ENTEROCOCCUS FAECIUM    RADIOLOGY STUDIES/RESULTS: Ct Abdomen Pelvis Wo Contrast  Result Date: 11/21/2018 CLINICAL DATA:  Sepsis. EXAM: CT ABDOMEN AND PELVIS WITHOUT CONTRAST TECHNIQUE: Multidetector CT imaging of the abdomen and pelvis was performed following the  standard protocol without IV contrast. COMPARISON:  CT scan 09/04/2018 FINDINGS: Lower chest: The heart is upper limits normal in size for age. Coronary artery calcifications and evidence of prior TAVR. Small bilateral pleural effusions and bibasilar atelectasis or infiltrates. Hepatobiliary: No focal hepatic lesions are identified without contrast. Numerous gallstones are noted in the gallbladder. Suspect mild gallbladder wall thickening and mild pericholecystic inflammatory changes. Could not exclude acute cholecystitis. No common bile duct dilatation. Pancreas: Moderate atrophy but no mass or acute inflammation. Spleen: Normal size.  No focal lesions. Adrenals/Urinary Tract: The adrenal glands are unremarkable and stable. Bilateral renal cysts but no worrisome renal masses. No renal or obstructing ureteral calculi. There is a Foley catheter in a decompressed bladder. No bladder mass or bladder calculi. Stomach/Bowel: The stomach contains an NG tube. The duodenum, small bowel and colon are grossly normal. No acute inflammatory process, mass lesion or obstructive findings. Evidence of a prior right hemicolectomy. Sigmoid colon diverticulosis without findings for acute diverticulitis. Small amount of fluid noted in the right pericolic gutter and in the anterior pararenal space. Vascular/Lymphatic: Advanced atherosclerotic calcifications involving the aorta. 4.6 x 4.0 cm infrarenal abdominal aortic aneurysm with a left-sided saccular component. This measured a maximum of 4.5 cm on the prior study. Reproductive: The prostate gland and seminal vesicles are grossly normal. Other: Small amount of free fluid in the pelvis but no pelvic mass or adenopathy. No inguinal mass or adenopathy. Musculoskeletal: No significant bony findings. Stable compression fracture of L1 with a vertebral plana appearance. IMPRESSION: 1. Cholelithiasis with findings suspicious for acute cholecystitis. Recommend correlation with right upper  quadrant ultrasound examination. 2. Stable infrarenal abdominal aortic aneurysm with a left-sided saccular component. 3. Small bilateral pleural effusions and bibasilar atelectasis or infiltrates. Electronically Signed   By: Marijo Sanes M.D.   On: 11/21/2018 08:57   US Abdomen Complete  Result Date: 11/21/2018 CLINICAL DATA:  Acute cholecystitis an urinary tract infection. Patient is intubated. Elevated total bilirubin. EXAM: ABDOMEN ULTRASOUND COMPLETE COMPARISON:  CT on 11/21/2018 a prior CT exams including 12/07/2013 FINDINGS: Gallbladder: Gallbladder wall is irregular and thickened, measuring 7 millimeters. Dependent stones are present, measuring on the order of 1.7 centimeters or smaller. Common bile duct: Diameter: 5.4 millimeter Liver: The visualized portion of the liver is unremarkable. LEFT hepatic lobe is not well seen because of bowel gas. Portal vein is patent on color Doppler imaging with normal direction of blood flow towards the liver. IVC: Visualized portion normal. Pancreas: Obscured by bowel gas. Spleen: Obscured by bowel gas. Right Kidney: Length: 11.0 centimeters. 4.6 x 4.5 x 5.2 centimeter cyst containing internal septation. On CT exam 2015, this lesion measured 3.2 x 3.2 centimeters. No hydronephrosis. Left Kidney: Length: 12.3 centimeters. Simple cyst is 3.5 x 2.4 x 2.9 centimeters. No hydronephrosis. Abdominal aorta: Not visualized on the level of the mid abdominal aorta to the bifurcation segment 1 bowel gas. No visualized aneurysm. Other findings: Study quality is degraded by patient body habitus and intubation. Note is made of a small RIGHT pleural effusion. IMPRESSION: 1. Irregular, thickened gallbladder wall and multiple gallstones. Presence or absence of sonographic Murphy's can not be assessed due to the patient's intubated status. Findings are suspicious for acute cholecystitis however. 2. RIGHT pleural effusion. 3.  Complex cystic lesion within the UPPER pole the RIGHT kidney.  Lesion has progressed since prior studies. If possible and needed, consider further characterization with outpatient contrast-enhanced CT or MRI with intravenous contrast. Electronically Signed   By: Nolon Nations M.D.   On: 11/21/2018 12:46   Ir Perc Cholecystostomy  Result Date: 11/23/2018 INDICATION: 85 year old male with a history of cholecystitis EXAM: CHOLECYSTOSTOMY MEDICATIONS: In-hospital antibiotics ANESTHESIA/SEDATION: Moderate (conscious) sedation was employed during this procedure. A total of Versed 2.0 mg and Fentanyl 0 mcg was administered intravenously. Moderate Sedation Time: 0 minutes. The patient's level of consciousness and vital signs were monitored continuously by radiology nursing throughout the procedure under my direct supervision. FLUOROSCOPY TIME:  Fluoroscopy Time: 0 minutes 24 seconds (42.6 mGy). COMPLICATIONS: None PROCEDURE: Informed written consent was obtained from the patient and the patient's family after a thorough discussion of the procedural risks, benefits and alternatives. All questions were addressed. Maximal Sterile Barrier Technique was utilized including caps, mask, sterile gowns, sterile gloves, sterile drape, hand hygiene and skin antiseptic. A timeout was performed prior to the initiation of the procedure. Ultrasound survey of the right upper quadrant was performed for planning purposes. Once the patient is prepped and draped in the usual sterile fashion, the skin and subcutaneous tissues overlying the gallbladder were generously infiltrated 1% lidocaine for local anesthesia. A coaxial needle was advanced under ultrasound guidance through the skin subcutaneous tissues and a small segment of liver into the gallbladder lumen. With removal of the stylet, spontaneous dark bile drainage occurred. Using modified Seldinger technique, a 10 French drain was placed into the gallbladder fossa, with aspiration of the sample for the lab. Contrast injection confirmed position  of the tube within the gallbladder lumen. Drainage catheter was attached to gravity drain with a suture retention placed. Patient tolerated the procedure well and remained hemodynamically stable throughout. No complications were encountered and no significant blood loss encountered. IMPRESSION: Status post image guided percutaneous cholecystostomy. Signed, Dulcy Fanny. Dellia Nims, RPVI [facility] Electronically Signed   By: Corrie Mckusick D.O.   On: 11/23/2018 08:16   Dg Chest Port 1 View  Result Date: 11/23/2018 CLINICAL DATA:  Acute respiratory failure EXAM: PORTABLE CHEST 1 VIEW COMPARISON:  Two days ago FINDINGS: Endotracheal tube tip between the clavicular heads and carina. The orogastric tube reaches the stomach. Low volume chest with streaky density at the bases. There may be trace pleural fluid. No Kerley lines or pneumothorax. Cardiomegaly. Transcatheter aortic valve replacement IMPRESSION: Stable hardware positioning, low lung volumes, and presumed atelectasis. Electronically Signed   By: Monte Fantasia M.D.   On: 11/23/2018 06:53   Dg Chest Portable 1 View  Result Date: 11/21/2018 CLINICAL DATA:  Intubation. EXAM: PORTABLE CHEST 1 VIEW COMPARISON:  Earlier film, same date. FINDINGS: The endotracheal tube is right at the carina and should be retracted approximately 3 cm. The NG tube is coursing down the esophagus and into the stomach. Stable cardiac enlargement. Bibasilar atelectasis but no definite infiltrates or effusions. IMPRESSION: The endotracheal tube is right at the carina and should be retracted at least 3 cm. Bibasilar atelectasis but no edema or infiltrates Electronically Signed   By: Marijo Sanes M.D.   On: 11/21/2018 07:40   Dg Chest Port 1 View  Result Date: 11/21/2018 CLINICAL DATA:  Sepsis. EXAM: PORTABLE CHEST 1 VIEW COMPARISON:  09/16/2018 FINDINGS: The heart is enlarged but stable. Marked tortuosity and calcification of  the thoracic aorta. Bibasilar atelectasis. No definite  pleural effusions. No obvious infiltrates. IMPRESSION: Cardiac enlargement and marked tortuosity and calcification of the thoracic aorta. No definite infiltrates, effusions or edema. Streaky basilar atelectasis. Electronically Signed   By: Marijo Sanes M.D.   On: 11/21/2018 06:21     LOS: 4 days   Oren Binet, MD  [facility]  If 7PM-7AM, please contact night-coverage  Please page via [email or web address]  Go to [email or web address] and use [place]'s universal password to access. If you do not have the password, please contact the hospital operator.  Locate the [facility] provider you are looking for under [facility] and page to a number that you can be directly reached. If you still have difficulty reaching the provider, please page the [facility] (Director on Call) for the Hospitalists listed on amion for assistance.  11/25/2018, 1:22 PM

## 2018-11-25 NOTE — Progress Notes (Signed)
Subjective: No new complaints   Antibiotics:  Anti-infectives (From admission, onward)   Start     Dose/Rate Route Frequency Ordered Stop   11/24/18 2100  piperacillin-tazobactam (ZOSYN) IVPB 3.375 g     3.375 g 12.5 mL/hr over 240 Minutes Intravenous Every 8 hours 11/24/18 1639     11/23/18 2000  DAPTOmycin (CUBICIN) 700 mg in sodium chloride 0.9 % IVPB  Status:  Discontinued     700 mg 228 mL/hr over 30 Minutes Intravenous Daily 11/23/18 1353 11/25/18 1144   11/22/18 0800  vancomycin (VANCOCIN) 1,500 mg in sodium chloride 0.9 % 500 mL IVPB  Status:  Discontinued     1,500 mg 250 mL/hr over 120 Minutes Intravenous Every 24 hours 11/21/18 0645 11/23/18 1334   11/21/18 1400  meropenem (MERREM) 1 g in sodium chloride 0.9 % 100 mL IVPB  Status:  Discontinued     1 g 200 mL/hr over 30 Minutes Intravenous Every 8 hours 11/21/18 0645 11/24/18 1632   11/21/18 0545  meropenem (MERREM) 1 g in sodium chloride 0.9 % 100 mL IVPB     1 g 200 mL/hr over 30 Minutes Intravenous STAT 11/21/18 0541 11/21/18 0648   11/21/18 0530  ceFEPIme (MAXIPIME) 2 g in sodium chloride 0.9 % 100 mL IVPB  Status:  Discontinued     2 g 200 mL/hr over 30 Minutes Intravenous  Once 11/21/18 0526 11/21/18 0535   11/21/18 0530  metroNIDAZOLE (FLAGYL) IVPB 500 mg  Status:  Discontinued     500 mg 100 mL/hr over 60 Minutes Intravenous Every 8 hours 11/21/18 0526 11/21/18 0535   11/21/18 0530  vancomycin (VANCOCIN) IVPB 1000 mg/200 mL premix  Status:  Discontinued     1,000 mg 200 mL/hr over 60 Minutes Intravenous  Once 11/21/18 0526 11/21/18 0528   11/21/18 0530  vancomycin (VANCOCIN) 2,000 mg in sodium chloride 0.9 % 500 mL IVPB     2,000 mg 250 mL/hr over 120 Minutes Intravenous  Once 11/21/18 0528 11/21/18 0925      Medications: Scheduled Meds: . amiodarone  100 mg Oral Daily  . aspirin  81 mg Oral Daily  . chlorhexidine gluconate (MEDLINE KIT)  15 mL Mouth Rinse BID  . docusate sodium  100 mg Oral  BID  . heparin  5,000 Units Subcutaneous Q8H  . insulin aspart  0-9 Units Subcutaneous TID AC & HS  . polyethylene glycol  17 g Oral Daily  . sodium chloride flush  10-40 mL Intracatheter Q12H  . sodium chloride flush  5 mL Intracatheter Q8H   Continuous Infusions: . sodium chloride Stopped (11/21/18 0959)  . sodium chloride    . piperacillin-tazobactam (ZOSYN)  IV 3.375 g (11/25/18 0508)   PRN Meds:.[CANCELED] Place/Maintain arterial line **AND** sodium chloride, acetaminophen, Melatonin, RESOURCE THICKENUP CLEAR, sodium chloride flush    Objective: Weight change:   Intake/Output Summary (Last 24 hours) at 11/25/2018 1253 Last data filed at 11/25/2018 4503 Gross per 24 hour  Intake 224.38 ml  Output 630 ml  Net -405.62 ml   Blood pressure 109/72, pulse 90, temperature 98 F (36.7 C), temperature source Oral, resp. rate 19, height 5' 10" (1.778 m), weight 102.4 kg, SpO2 97 %. Temp:  [98 F (36.7 C)-98.9 F (37.2 C)] 98 F (36.7 C) (02/12 0800) Pulse Rate:  [80-90] 90 (02/11 2125) Resp:  [19-25] 19 (02/12 0513) BP: (79-109)/(56-72) 109/72 (02/12 0513) SpO2:  [97 %-100 %] 97 % (02/11 2125)  Physical Exam:  General: Alert and awake,  HEENT: anicteric sclera, EOMI CVS regular rate, normal no murmurs gallops or rubs heard Chest: , no wheezing, no respiratory distress, clear Abdomen: drain in place  Skin: no rashes Neuro: nonfocal  CBC:    BMET Recent Labs    11/24/18 0423 11/25/18 0357  NA 142 139  K 2.9* 3.7  CL 114* 108  CO2 21* 25  GLUCOSE 113* 135*  BUN 17 14  CREATININE 0.82 0.88  CALCIUM 7.5* 7.6*     Liver Panel  Recent Labs    11/24/18 0423 11/25/18 0357  PROT 4.8* 5.2*  ALBUMIN 1.5* 1.7*  AST 54* 65*  ALT 48* 58*  ALKPHOS 76 86  BILITOT 0.7 1.2       Sedimentation Rate No results for input(s): ESRSEDRATE in the last 72 hours. C-Reactive Protein No results for input(s): CRP in the last 72 hours.  Micro Results: Recent Results  (from the past 720 hour(s))  Stool Culture     Status: None   Collection Time: 10/28/18  3:12 PM  Result Value Ref Range Status   MICRO NUMBER: [id number]  Final   SPECIMEN QUALITY: Adequate  Final   SOURCE: STOOL  Final   STATUS: FINAL  Final   SHIGA RESULT: Not Detected  Final   MICRO NUMBER: [id number]  Final   SPECIMEN QUALITY: Adequate  Final   Source STOOL  Final   STATUS: FINAL  Final   CAM RESULT: No enteric Campylobacter isolated  Final   MICRO NUMBER: [id number]  Final   SPECIMEN QUALITY: Adequate  Final   SOURCE: STOOL  Final   STATUS: FINAL  Final   SS RESULT: No Salmonella or Shigella isolated  Final  Culture, blood (Routine x 2)     Status: Abnormal   Collection Time: 11/21/18  5:21 AM  Result Value Ref Range Status   Specimen Description BLOOD RIGHT ANTECUBITAL  Final   Special Requests   Final    BOTTLES DRAWN AEROBIC AND ANAEROBIC Blood Culture adequate volume   Culture  Setup Time   Final    GRAM NEGATIVE RODS IN BOTH AEROBIC AND ANAEROBIC BOTTLES CRITICAL RESULT CALLED TO, READ BACK BY AND VERIFIED WITH: B MANCHERIL,PHARMD AT 2014 11/21/2018 BY L BENFIELD    Culture (A)  Final    ENTEROBACTER CLOACAE CORRECTED ON 02/10 AT 0920: PREVIOUSLY REPORTED AS ESCHERICHIA COLI CORRECTED RESULTS PREVIOUSLY REPORTED AS: ESCHERICHIA COLI CORRECTED RESULTS CALLED TO: RN Oliver Hum [id number] Performed at [facility], [place] [address], [place], [place] [address]    Report Status 11/23/2018 FINAL  Final   Organism ID, Bacteria ENTEROBACTER CLOACAE  Final      Susceptibility   Enterobacter cloacae - MIC*    AMPICILLIN 16 INTERMEDIATE Intermediate     CEFAZOLIN >=64 RESISTANT Resistant     CEFEPIME <=1 SENSITIVE Sensitive     CEFTAZIDIME <=1 SENSITIVE Sensitive     CEFTRIAXONE <=1 SENSITIVE Sensitive     CIPROFLOXACIN <=0.25 SENSITIVE Sensitive     GENTAMICIN <=1 SENSITIVE Sensitive     IMIPENEM 0.5 SENSITIVE Sensitive     TRIMETH/SULFA <=20 SENSITIVE Sensitive       AMPICILLIN/SULBACTAM 8 SENSITIVE Sensitive     PIP/TAZO <=4 SENSITIVE Sensitive     Extended ESBL NEGATIVE Sensitive     * ENTEROBACTER CLOACAE CORRECTED ON 02/10 AT 0920: PREVIOUSLY REPORTED AS ESCHERICHIA COLI  Blood Culture ID Panel (Reflexed)     Status: Abnormal   Collection Time:  11/21/18  5:21 AM  Result Value Ref Range Status   Enterococcus species NOT DETECTED NOT DETECTED Final   Listeria monocytogenes NOT DETECTED NOT DETECTED Final   Staphylococcus species NOT DETECTED NOT DETECTED Final   Staphylococcus aureus (BCID) NOT DETECTED NOT DETECTED Final   Streptococcus species NOT DETECTED NOT DETECTED Final   Streptococcus agalactiae NOT DETECTED NOT DETECTED Final   Streptococcus pneumoniae NOT DETECTED NOT DETECTED Final   Streptococcus pyogenes NOT DETECTED NOT DETECTED Final   Acinetobacter baumannii NOT DETECTED NOT DETECTED Final   Enterobacteriaceae species DETECTED (A) NOT DETECTED Final    Comment: Enterobacteriaceae represent a large family of gram-negative bacteria, not a single organism. CRITICAL RESULT CALLED TO, READ BACK BY AND VERIFIED WITH: B MANCHERIL,PHARMD AT 2014 11/21/2018 BY L BENFIELD    Enterobacter cloacae complex DETECTED (A) NOT DETECTED Final    Comment: CRITICAL RESULT CALLED TO, READ BACK BY AND VERIFIED WITH: B MANCHERIL,PHARMD AT 2014 11/21/2018 BY L BENFIELD    Escherichia coli NOT DETECTED NOT DETECTED Final   Klebsiella oxytoca NOT DETECTED NOT DETECTED Final   Klebsiella pneumoniae NOT DETECTED NOT DETECTED Final   Proteus species NOT DETECTED NOT DETECTED Final   Serratia marcescens NOT DETECTED NOT DETECTED Final   Carbapenem resistance NOT DETECTED NOT DETECTED Final   Haemophilus influenzae NOT DETECTED NOT DETECTED Final   Neisseria meningitidis NOT DETECTED NOT DETECTED Final   Pseudomonas aeruginosa NOT DETECTED NOT DETECTED Final   Candida albicans NOT DETECTED NOT DETECTED Final   Candida glabrata NOT DETECTED NOT DETECTED  Final   Candida krusei NOT DETECTED NOT DETECTED Final   Candida parapsilosis NOT DETECTED NOT DETECTED Final   Candida tropicalis NOT DETECTED NOT DETECTED Final    Comment: Performed at [facility], [place]. [address] [place], [place] [address]  Culture, blood (Routine x 2)     Status: Abnormal   Collection Time: 11/21/18  5:22 AM  Result Value Ref Range Status   Specimen Description BLOOD BLOOD LEFT FOREARM  Final   Special Requests   Final    BOTTLES DRAWN AEROBIC AND ANAEROBIC Blood Culture adequate volume   Culture  Setup Time   Final    GRAM NEGATIVE RODS IN BOTH AEROBIC AND ANAEROBIC BOTTLES CRITICAL VALUE NOTED.  VALUE IS CONSISTENT WITH PREVIOUSLY REPORTED AND CALLED VALUE.    Culture (A)  Final    ENTEROBACTER CLOACAE SUSCEPTIBILITIES PERFORMED ON PREVIOUS CULTURE WITHIN THE LAST 5 DAYS. Performed at [facility], [place] [address], [place], [place] [address]    Report Status 11/23/2018 FINAL  Final  Culture, Urine     Status: Abnormal   Collection Time: 11/21/18  8:05 AM  Result Value Ref Range Status   Specimen Description URINE, CATHETERIZED  Final   Special Requests   Final    Normal Performed at [facility], [place] [address], [place], [place] [address]    Culture >=100,000 COLONIES/mL YEAST (A)  Final   Report Status 11/22/2018 FINAL  Final  Culture, blood (routine x 2)     Status: None (Preliminary result)   Collection Time: 11/21/18  9:00 AM  Result Value Ref Range Status   Specimen Description BLOOD RIGHT HAND  Final   Special Requests   Final    BOTTLES DRAWN AEROBIC ONLY Blood Culture results may not be optimal due to an excessive volume of blood received in culture bottles   Culture   Final    NO GROWTH 4  DAYS Performed at [facility], [place] [address] [place], [place] [address]    Report Status PENDING  Incomplete  Culture, blood (routine x 2)     Status: None (Preliminary result)   Collection Time: 11/21/18  9:10 AM  Result Value  Ref Range Status   Specimen Description BLOOD RIGHT WRIST  Final   Special Requests   Final    BOTTLES DRAWN AEROBIC ONLY Blood Culture results may not be optimal due to an inadequate volume of blood received in culture bottles   Culture   Final    NO GROWTH 4 DAYS Performed at [facility], [place] [address], [place], [place] [address]    Report Status PENDING  Incomplete  Respiratory Panel by PCR     Status: None   Collection Time: 11/21/18  9:18 AM  Result Value Ref Range Status   Adenovirus NOT DETECTED NOT DETECTED Final   Coronavirus 229E NOT DETECTED NOT DETECTED Final    Comment: (NOTE) The Coronavirus on the Respiratory Panel, DOES NOT test for the novel  Coronavirus (2019 nCoV)    Coronavirus HKU1 NOT DETECTED NOT DETECTED Final   Coronavirus NL63 NOT DETECTED NOT DETECTED Final   Coronavirus OC43 NOT DETECTED NOT DETECTED Final   Metapneumovirus NOT DETECTED NOT DETECTED Final   Rhinovirus / Enterovirus NOT DETECTED NOT DETECTED Final   Influenza A NOT DETECTED NOT DETECTED Final   Influenza B NOT DETECTED NOT DETECTED Final   Parainfluenza Virus 1 NOT DETECTED NOT DETECTED Final   Parainfluenza Virus 2 NOT DETECTED NOT DETECTED Final   Parainfluenza Virus 3 NOT DETECTED NOT DETECTED Final   Parainfluenza Virus 4 NOT DETECTED NOT DETECTED Final   Respiratory Syncytial Virus NOT DETECTED NOT DETECTED Final   Bordetella pertussis NOT DETECTED NOT DETECTED Final   Chlamydophila pneumoniae NOT DETECTED NOT DETECTED Final   Mycoplasma pneumoniae NOT DETECTED NOT DETECTED Final    Comment: Performed at [facility], [address], [place], [place] [address]  MRSA PCR Screening     Status: None   Collection Time: 11/21/18  9:18 AM  Result Value Ref Range Status   MRSA by PCR NEGATIVE NEGATIVE Final    Comment:        The GeneXpert MRSA Assay (FDA approved for NASAL specimens only), is one component of a comprehensive MRSA colonization surveillance program. It  is not intended to diagnose MRSA infection nor to guide or monitor treatment for MRSA infections. Performed at [facility], [place] [address], [place], [place] [address]   Culture, respiratory (non-expectorated)     Status: None   Collection Time: 11/21/18  4:42 PM  Result Value Ref Range Status   Specimen Description TRACHEAL ASPIRATE  Final   Special Requests Normal  Final   Gram Stain   Final    RARE WBC PRESENT, PREDOMINANTLY PMN RARE GRAM POSITIVE COCCI    Culture   Final    FEW Consistent with normal respiratory flora. Performed at [facility], [place] [address], [place], [place] [address]    Report Status 11/24/2018 FINAL  Final  Aerobic/Anaerobic Culture (surgical/deep wound)     Status: None (Preliminary result)   Collection Time: 11/22/18  2:56 PM  Result Value Ref Range Status   Specimen Description BILE  Final   Special Requests NONE  Final   Gram Stain   Final    FEW WBC PRESENT, PREDOMINANTLY PMN MODERATE GRAM NEGATIVE RODS RARE Lonell Grandchild  POSITIVE COCCI IN PAIRS RARE GRAM POSITIVE RODS Performed at [facility], [place] [address], [place], [place] [address]    Culture   Final    MODERATE ENTEROBACTER ASBURIAE MODERATE ENTEROCOCCUS FAECIUM NO ANAEROBES ISOLATED; CULTURE IN PROGRESS FOR 5 DAYS    Report Status PENDING  Incomplete   Organism ID, Bacteria ENTEROBACTER ASBURIAE  Final   Organism ID, Bacteria ENTEROCOCCUS FAECIUM  Final      Susceptibility   Enterobacter asburiae - MIC*    CEFAZOLIN >=64 RESISTANT Resistant     CEFEPIME <=1 SENSITIVE Sensitive     CEFTAZIDIME <=1 SENSITIVE Sensitive     CEFTRIAXONE <=1 SENSITIVE Sensitive     CIPROFLOXACIN <=0.25 SENSITIVE Sensitive     GENTAMICIN <=1 SENSITIVE Sensitive     IMIPENEM 0.5 SENSITIVE Sensitive     TRIMETH/SULFA <=20 SENSITIVE Sensitive     PIP/TAZO <=4 SENSITIVE Sensitive     * MODERATE ENTEROBACTER ASBURIAE   Enterococcus faecium - MIC*    AMPICILLIN <=2 SENSITIVE Sensitive      VANCOMYCIN <=0.5 SENSITIVE Sensitive     GENTAMICIN SYNERGY SENSITIVE Sensitive     * MODERATE ENTEROCOCCUS FAECIUM    Studies/Results: No results found.    Assessment/Plan:  INTERVAL HISTORY: His enterococcus ECM is ampicillin sensitive   Principal Problem:   Bacteremia due to Enterobacter species Active Problems:   Acute respiratory failure (HCC)   Sepsis (HCC)   Shock ([place])   Pressure injury of skin   Enterococcus faecalis infection    ISMAIL GRAZIANI is a 85 y.o. male with Mr. Noralyn Pick is an 84 y/o male with previous history of TAVR and ESBL producing Klebsiella admitted with altered mental status and fever and now with cholecysitis s/p IR drain placement with cultures showing Enterobacter asburiae/Enterococcus faecium complicated with Enterobacter cloacie bacteremia   Will simplify to Zosyn to cover his pathogens  Have asked cardiology to consider transesophageal echocardiogram.  TEE is positive I would want to treat him with a course of at least 4 weeks of intravenous antibiotics and I would actually broaden back out to cover the ESBL that he had before because it could have also seeded the valve.  If TEE is negative I will give him a shorter course of antibiotics IV likely 2 weeks.   LOS: 4 days   Alcide Evener 11/25/2018, 12:53 PM

## 2018-11-25 NOTE — Progress Notes (Signed)
CSW notes patient comes from [place]. CSW to continue to follow for needs.   Percell Locus Shaniece Bussa LCSW [phone]

## 2018-11-26 ENCOUNTER — Inpatient Hospital Stay (HOSPITAL_COMMUNITY): Payer: Medicare Other

## 2018-11-26 ENCOUNTER — Inpatient Hospital Stay: Payer: Self-pay

## 2018-11-26 ENCOUNTER — Encounter (HOSPITAL_COMMUNITY)
Admission: EM | Disposition: A | Discharge status: Skilled Nursing Facility | Payer: Self-pay | Source: Skilled Nursing Facility | Attending: Internal Medicine

## 2018-11-26 ENCOUNTER — Encounter (HOSPITAL_COMMUNITY): Payer: Self-pay

## 2018-11-26 DIAGNOSIS — R6520 Severe sepsis without septic shock: Secondary | ICD-10-CM

## 2018-11-26 DIAGNOSIS — K8309 Other cholangitis: Secondary | ICD-10-CM

## 2018-11-26 DIAGNOSIS — I428 Other cardiomyopathies: Secondary | ICD-10-CM

## 2018-11-26 DIAGNOSIS — Z953 Presence of xenogenic heart valve: Secondary | ICD-10-CM

## 2018-11-26 DIAGNOSIS — I340 Nonrheumatic mitral (valve) insufficiency: Secondary | ICD-10-CM

## 2018-11-26 DIAGNOSIS — R7881 Bacteremia: Secondary | ICD-10-CM

## 2018-11-26 DIAGNOSIS — K810 Acute cholecystitis: Secondary | ICD-10-CM

## 2018-11-26 DIAGNOSIS — R011 Cardiac murmur, unspecified: Secondary | ICD-10-CM

## 2018-11-26 HISTORY — PX: TEE WITHOUT CARDIOVERSION: SHX5443

## 2018-11-26 LAB — BASIC METABOLIC PANEL
Anion gap: 5 (ref 5–15)
BUN: 12 mg/dL (ref 8–23)
CO2: 24 mmol/L (ref 22–32)
Calcium: 7.6 mg/dL — ABNORMAL LOW (ref 8.9–10.3)
Chloride: 109 mmol/L (ref 98–111)
Creatinine, Ser: 0.81 mg/dL (ref 0.61–1.24)
GFR calc Af Amer: >60 mL/min (ref >60)
GLUCOSE: 133 mg/dL — AB (ref 70–99)
Potassium: 3.1 mmol/L — ABNORMAL LOW (ref 3.5–5.1)
Sodium: 138 mmol/L (ref 135–145)

## 2018-11-26 LAB — CBC
HCT: 30.4 % — ABNORMAL LOW (ref 39.0–52.0)
Hemoglobin: 9.9 g/dL — ABNORMAL LOW (ref 13.0–17.0)
MCH: 30.4 pg (ref 26.0–34.0)
MCHC: 32.6 g/dL (ref 30.0–36.0)
MCV: 93.3 fL (ref 80.0–100.0)
NRBC: 0 % (ref 0.0–0.2)
Platelets: 137 10*3/uL — ABNORMAL LOW (ref 150–400)
RBC: 3.26 MIL/uL — ABNORMAL LOW (ref 4.22–5.81)
RDW: 13.9 % (ref 11.5–15.5)
WBC: 5.8 10*3/uL (ref 4.0–10.5)

## 2018-11-26 LAB — GLUCOSE, CAPILLARY
GLUCOSE-CAPILLARY: 116 mg/dL — AB (ref 70–99)
Glucose-Capillary: 111 mg/dL — ABNORMAL HIGH (ref 70–99)
Glucose-Capillary: 140 mg/dL — ABNORMAL HIGH (ref 70–99)

## 2018-11-26 LAB — CULTURE, BLOOD (ROUTINE X 2)
Culture: NO GROWTH
Culture: NO GROWTH

## 2018-11-26 LAB — MAGNESIUM: Magnesium: 2 mg/dL (ref 1.7–2.4)

## 2018-11-26 LAB — PHOSPHORUS: Phosphorus: 2.7 mg/dL (ref 2.5–4.6)

## 2018-11-26 SURGERY — ECHOCARDIOGRAM, TRANSESOPHAGEAL
Anesthesia: Moderate Sedation

## 2018-11-26 MED ORDER — CHLORHEXIDINE GLUCONATE 0.12 % MT SOLN
OROMUCOSAL | Status: AC
  Administered 2018-11-26: 15 mL via OROMUCOSAL
  Filled 2018-11-26: qty 15

## 2018-11-26 MED ORDER — POTASSIUM CHLORIDE 10 MEQ/100ML IV SOLN
INTRAVENOUS | Status: AC
  Administered 2018-11-26: 10 meq
  Filled 2018-11-26: qty 100

## 2018-11-26 MED ORDER — MIDAZOLAM HCL (PF) 10 MG/2ML IJ SOLN
INTRAMUSCULAR | Status: DC | PRN
  Administered 2018-11-26: 1 mg via INTRAVENOUS

## 2018-11-26 MED ORDER — MIDAZOLAM HCL (PF) 5 MG/ML IJ SOLN
INTRAMUSCULAR | Status: AC
  Filled 2018-11-26: qty 2

## 2018-11-26 MED ORDER — BUTAMBEN-TETRACAINE-BENZOCAINE 2-2-14 % EX AERO
INHALATION_SPRAY | CUTANEOUS | Status: DC | PRN
  Administered 2018-11-26: 2 via TOPICAL

## 2018-11-26 MED ORDER — CHLORHEXIDINE GLUCONATE 0.12 % MT SOLN
OROMUCOSAL | Status: AC
  Administered 2018-11-27: 15 mL via OROMUCOSAL
  Filled 2018-11-26: qty 15

## 2018-11-26 MED ORDER — FENTANYL CITRATE (PF) 100 MCG/2ML IJ SOLN
INTRAMUSCULAR | Status: DC | PRN
  Administered 2018-11-26: 12.5 ug via INTRAVENOUS

## 2018-11-26 MED ORDER — POTASSIUM CHLORIDE 10 MEQ/100ML IV SOLN
10.0000 meq | INTRAVENOUS | Status: AC
  Administered 2018-11-26: 10 meq via INTRAVENOUS
  Filled 2018-11-26: qty 100

## 2018-11-26 MED ORDER — FENTANYL CITRATE (PF) 100 MCG/2ML IJ SOLN
INTRAMUSCULAR | Status: AC
  Filled 2018-11-26: qty 2

## 2018-11-26 NOTE — Progress Notes (Signed)
Progress Note  Patient Name: William [name] Date of Encounter: 11/26/2018  Primary Cardiologist: Sanda Klein, MD   Subjective   Seen post TEE. No complaints. Wife is thrilled that TAVR valve was not infected. Asking when they can return to nursing facility.  Inpatient Medications    Scheduled Meds: . amiodarone  100 mg Oral Daily  . aspirin  81 mg Oral Daily  . chlorhexidine gluconate (MEDLINE KIT)  15 mL Mouth Rinse BID  . clopidogrel  75 mg Oral Q breakfast  . feeding supplement (ENSURE ENLIVE)  237 mL Oral BID BM  . heparin  5,000 Units Subcutaneous Q8H  . insulin aspart  0-9 Units Subcutaneous TID AC & HS  . polyethylene glycol  17 g Oral Daily  . sodium chloride flush  10-40 mL Intracatheter Q12H  . sodium chloride flush  5 mL Intracatheter Q8H   Continuous Infusions: . sodium chloride Stopped (11/21/18 0959)  . sodium chloride    . piperacillin-tazobactam (ZOSYN)  IV 3.375 g (11/26/18 0520)   PRN Meds: [CANCELED] Place/Maintain arterial line **AND** sodium chloride, acetaminophen, bisacodyl, Melatonin, RESOURCE THICKENUP CLEAR, senna, sodium chloride flush   Vital Signs    Vitals:   11/26/18 1055 11/26/18 1104 11/26/18 1110 11/26/18 1115  BP: 119/72 103/68  (!) 101/54  Pulse: 87 86 92 85  Resp: 18 (!) 22 (!) 26 20  Temp:  98.1 F (36.7 C)    TempSrc:  Oral    SpO2: 97% 96% 97% 96%  Weight:      Height:        Intake/Output Summary (Last 24 hours) at 11/26/2018 1445 Last data filed at 11/26/2018 0524 Gross per 24 hour  Intake 10 ml  Output 50 ml  Net -40 ml   Last 3 Weights 11/23/2018 11/22/2018 11/21/2018  Weight (lbs) 225 lb 12 oz 210 lb 1.6 oz 209 lb 7 oz  Weight (kg) 102.4 kg 95.3 kg 95 kg      Telemetry    Sinus rhythm - Personally Reviewed  ECG    Sinus tach, LBBB - Personally Reviewed  Physical Exam   GEN: No acute distress.   Neck: No JVD Cardiac: RRR, no murmurs, rubs, or gallops.  Respiratory: Clear to auscultation  bilaterally. GI: Soft, nontender, non-distended  MS: Bilateral trace-edema; No deformity. Neuro:  Nonfocal  Psych: Normal affect   Labs    Chemistry Recent Labs  Lab 11/23/18 0355  11/24/18 0423 11/25/18 0357 11/26/18 0430  NA 141   < > 142 139 138  K 3.1*   < > 2.9* 3.7 3.1*  CL 113*  --  114* 108 109  CO2 20*  --  21* 25 24  GLUCOSE 147*  --  113* 135* 133*  BUN 23  --  '17 14 12  ' CREATININE 0.89  --  0.82 0.88 0.81  CALCIUM 7.4*  --  7.5* 7.6* 7.6*  PROT 5.3*  --  4.8* 5.2*  --   ALBUMIN 1.7*  --  1.5* 1.7*  --   AST 46*  --  54* 65*  --   ALT 41  --  48* 58*  --   ALKPHOS 79  --  76 86  --   BILITOT 0.8  --  0.7 1.2  --   GFRNONAA >60  --  >60 >60 >60  GFRAA >60  --  >60 >60 >60  ANIONGAP 8  --  '7 6 5   ' < > = values in this  interval not displayed.     Hematology Recent Labs  Lab 11/24/18 0423 11/25/18 0357 11/26/18 0430  WBC 4.9 5.7 5.8  RBC 2.93* 3.37* 3.26*  HGB 9.1* 10.2* 9.9*  HCT 28.5* 32.9* 30.4*  MCV 97.3 97.6 93.3  MCH 31.1 30.3 30.4  MCHC 31.9 31.0 32.6  RDW 14.5 14.2 13.9  PLT 102* 123* 137*    Cardiac Enzymes Recent Labs  Lab 11/21/18 0851 11/21/18 1350 11/21/18 1939  TROPONINI 0.06* 0.05* 0.04*   No results for input(s): TROPIPOC in the last 168 hours.   BNP Recent Labs  Lab 11/21/18 0851  BNP 332.5*     DDimer No results for input(s): DDIMER in the last 168 hours.   Radiology    [place] Ekg Site Rite  Result Date: 11/26/2018 If [facility] image not attached, placement could not be confirmed due to current cardiac rhythm.   Cardiac Studies   TEE today  Echo 11/23/18 1. The left ventricle has severely reduced systolic function of 31-59%. The cavity size was normal. There is severely increased left ventricular wall thickness. Echo evidence of impaired diastolic relaxation. 2. The right ventricle has normal systolic function. The cavity was normal. There is no increase in right ventricular wall thickness. 3. Left atrial size  was mildly dilated. 4. The mitral valve is normal in structure. There is mild thickening. 5. The tricuspid valve is normal in structure. 6. The aortic valve was not assessed There is mild thickening and mild calcification of the aortic valve. Aortic valve regurgitation is trivial by color flow Doppler. 7. AV prosthesis is difficult to see Peak and mean gradients across valve are 24 and 11 mm Hg respectively. 8. LVEF is severely depressed with diffuse hypokinesis; lateral akinesis.  Patient Profile     85 y.o. male with a hx of AAA, NICM/chronic HF s/p ICD (now removed), VT, OSA, HLD, degenerative arthritis/lumber vertebral fracture with reduced mobility and severe AS s/p TAVR who is being followed for bacteremia at the request of Dr. Sloan Leiter. TEE today without evidence of endocarditis.  Assessment & Plan    Sepsis, with bacteremia 2/2 acute cholecystitis: has perc drain placed, on antibiotics. -blood cultures positive for enterobacter cloacae, bile positive for enterobacter and enterococcus -ID to assist with final recommendations re: antibiotic duration  -TEE today without evidence of infection on TAVR valve. -now on room air after requiring intubation. 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 -appears euvolemic, chronic EF of 25-30% -continue amiodarone, metoprolol succinate  Hyperlipidemia: hold ezetimibe-simvastatin, given acute cholecystitis and elevated LFTs  [facility] will sign off, as patient's wife is hopeful they may return to nursing facility soon. We are always more than happy to answer any questions if they remain in the hospital longer than anticipated.   Medication Recommendations:  Aspirin, clopidogrel, amiodarone, metoprolol as noted. Hold  ezetimibe-simvastatin given cholecystitis and elevated LFTs Other recommendations (labs, testing, etc):  none Follow up as an outpatient:  Will arrange follow up post discharge  For questions or updates, please contact [facility] Please consult [email or web address] for contact info under     Signed, Buford Dresser, MD  11/26/2018, 2:45 PM

## 2018-11-26 NOTE — Progress Notes (Addendum)
PROGRESS NOTE        PATIENT DETAILS Name: William [name] Age: 85 y.o. Sex: male Date of Birth: [birth year] Admit Date: 11/21/2018 Admitting Physician Rigoberto Noel, MD DEY:[email or web address], Fransico Him, MD  Brief Narrative: Patient is a 85 y.o. 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  Managed in the ICU, extubated on 2/10-stabilized and transferred to the triad hospitalist service on 2/12.  See below for further details.  Significant events: 11/21/2018 admitted to the hospital for respiratory failure 2/9: Percutaneous drain placed 2/10: Successfully extubated, changed to daptomycin 2/12: Transferred to the triad hospitalist service   Subjective: Apparently has not slept well in 2 days.  Denies any chest pain or shortness of breath.  Assessment/Plan: Severe sepsis secondary to acute cholecystitis and Enterobacter bacteremia: Sepsis physiology has resolved, s/p percutaneous cholecystostomy drain on 2/9.  Blood cultures positive for Enterobacter cloacae, bile cultures positive for enterococcus and Enterobacter.  Continue Zosyn-we will discuss with ID, regarding duration of antimicrobial therapy on discharge.  Addendum D/w Dr Tommy Medal- 2 weeks of Zosyn of discharge, place PICC line. Repeat cultures once completes antimicrobial tx   Acute metabolic encephalopathy: Resolved-he is back to baseline.  Secondary to above-suspect may have some cognitive dysfunction/dementia at baseline.   Acute hypoxic respiratory failure: Extubated on 2/10-titrate off oxygen to room air.  History of ventricular tachycardia had  AICD in place but this was explanted in December 2019 for ESBL producing Klebsiella lead endocarditis: Continue telemetry monitoring-continue amiodarone.  History of severe aortic stenosis s/p TAVR in November 2019: TTE on 2/10 with difficult to visualize AV prosthesis-but TEE without any suggestion of vegetations on the aortic valve.  Continue Plavix.    Chronic systolic heart failure (EF 25-30% by TTE on 11/23/2018): Volume status appears relatively stable-follow.  Severe deconditioning/debility: Secondary to acute illness-and numerous recent hospitalizations-await PT evaluation.  Attempt to ambulate.  AAA (4.6 x 4 cm infrarenal): Stable for outpatient follow-up.  Multiple small pulmonary nodules throughout the lung seen on CT chest on 08/03/2018: Stable for outpatient follow-up  DVT Prophylaxis: Prophylactic Heparin   Code Status: Full code  Family Communication: Daughter at at bedside  Disposition Plan: Remain inpatient-SNF hopefully tomorrow.  Antimicrobial agents: Anti-infectives (From admission, onward)   Start     Dose/Rate Route Frequency Ordered Stop   11/24/18 2100  piperacillin-tazobactam (ZOSYN) IVPB 3.375 g     3.375 g 12.5 mL/hr over 240 Minutes Intravenous Every 8 hours 11/24/18 1639     11/23/18 2000  DAPTOmycin (CUBICIN) 700 mg in sodium chloride 0.9 % IVPB  Status:  Discontinued     700 mg 228 mL/hr over 30 Minutes Intravenous Daily 11/23/18 1353 11/25/18 1144   11/22/18 0800  vancomycin (VANCOCIN) 1,500 mg in sodium chloride 0.9 % 500 mL IVPB  Status:  Discontinued     1,500 mg 250 mL/hr over 120 Minutes Intravenous Every 24 hours 11/21/18 0645 11/23/18 1334   11/21/18 1400  meropenem (MERREM) 1 g in sodium chloride 0.9 % 100 mL IVPB  Status:  Discontinued     1 g 200 mL/hr  over 30 Minutes Intravenous Every 8 hours 11/21/18 0645 11/24/18 1632   11/21/18 0545  meropenem (MERREM) 1 g in sodium chloride 0.9 % 100 mL IVPB     1 g 200 mL/hr over 30 Minutes  Intravenous STAT 11/21/18 0541 11/21/18 0648   11/21/18 0530  ceFEPIme (MAXIPIME) 2 g in sodium chloride 0.9 % 100 mL IVPB  Status:  Discontinued     2 g 200 mL/hr over 30 Minutes Intravenous  Once 11/21/18 0526 11/21/18 0535   11/21/18 0530  metroNIDAZOLE (FLAGYL) IVPB 500 mg  Status:  Discontinued     500 mg 100 mL/hr over 60 Minutes Intravenous Every 8 hours 11/21/18 0526 11/21/18 0535   11/21/18 0530  vancomycin (VANCOCIN) IVPB 1000 mg/200 mL premix  Status:  Discontinued     1,000 mg 200 mL/hr over 60 Minutes Intravenous  Once 11/21/18 0526 11/21/18 0528   11/21/18 0530  vancomycin (VANCOCIN) 2,000 mg in sodium chloride 0.9 % 500 mL IVPB     2,000 mg 250 mL/hr over 120 Minutes Intravenous  Once 11/21/18 0528 11/21/18 0925      Procedures: OETT 2/8 > 2/10 Right radial A-line 2/8 > discontinued  CONSULTS:  cardiology, pulmonary/intensive care, ID and general surgery  Time spent: 25- minutes-Greater than 50% of this time was spent in counseling, explanation of diagnosis, planning of further management, and coordination of care.  MEDICATIONS: Scheduled Meds: . amiodarone  100 mg Oral Daily  . aspirin  81 mg Oral Daily  . chlorhexidine gluconate (MEDLINE KIT)  15 mL Mouth Rinse BID  . clopidogrel  75 mg Oral Q breakfast  . feeding supplement (ENSURE ENLIVE)  237 mL Oral BID BM  . heparin  5,000 Units Subcutaneous Q8H  . insulin aspart  0-9 Units Subcutaneous TID AC & HS  . polyethylene glycol  17 g Oral Daily  . sodium chloride flush  10-40 mL Intracatheter Q12H  . sodium chloride flush  5 mL Intracatheter Q8H   Continuous Infusions: . sodium chloride Stopped (11/21/18 0959)  . sodium chloride    . piperacillin-tazobactam (ZOSYN)  IV 3.375 g (11/26/18 0520)   PRN Meds:.[CANCELED] Place/Maintain arterial line **AND** sodium chloride, acetaminophen, bisacodyl, Melatonin, RESOURCE THICKENUP CLEAR, senna, sodium chloride flush   PHYSICAL EXAM: Vital signs: Vitals:    11/26/18 1055 11/26/18 1104 11/26/18 1110 11/26/18 1115  BP: 119/72 103/68  (!) 101/54  Pulse: 87 86 92 85  Resp: 18 (!) 22 (!) 26 20  Temp:  98.1 F (36.7 C)    TempSrc:  Oral    SpO2: 97% 96% 97% 96%  Weight:      Height:       Filed Weights   11/21/18 0512 11/22/18 0500 11/23/18 0340  Weight: 95 kg 95.3 kg 102.4 kg   Body mass index is 32.39 kg/m.   General appearance:Awake, alert, not in any distress.  Eyes:no scleral icterus. HEENT: Atraumatic and Normocephalic Neck: supple, no JVD. Resp:Good air entry bilaterally,no rales or rhonchi CVS: S1 S2 regular GI: Bowel sounds present, Non tender and not distended with no gaurding, rigidity or rebound. Extremities: B/L Lower Ext shows no edema, both legs are warm to touch Neurology:  Non focal Psychiatric: Normal judgment and insight. Normal mood. Musculoskeletal:No digital cyanosis Skin:No Rash, warm and dry Wounds:N/A  I have personally reviewed following labs and imaging studies  LABORATORY DATA: CBC: Recent Labs  Lab 11/21/18 0521  11/22/18 0329  11/23/18 0355 11/23/18 0751 11/24/18 0423 11/25/18 0357 11/26/18 0430  WBC 14.1*   < > 7.7  --  6.4  --  4.9 5.7 5.8  NEUTROABS 12.6*  --   --   --   --   --   --   --   --   HGB 12.3*   < > 10.0*   < > 10.3* 15.0 9.1* 10.2* 9.9*  HCT 36.3*   < > 30.4*   < > 31.0* 44.0 28.5* 32.9* 30.4*  MCV 93.3   < > 93.5  --  95.1  --  97.3 97.6 93.3  PLT 163   < > 107*  --  105*  --  102* 123* 137*   < > = values in this interval not displayed.    Basic Metabolic Panel: Recent Labs  Lab 11/22/18 0329  11/22/18 1229 11/22/18 1645 11/23/18 0355 11/23/18 0751 11/24/18 0423 11/25/18 0357 11/26/18 0430  NA 140   < > 139  --  141 142 142 139 138  K 3.3*   < > 3.4*  --  3.1* 3.4* 2.9* 3.7 3.1*  CL 113*  --  113*  --  113*  --  114* 108 109  CO2 19*  --  20*  --  20*  --  21* 25 24  GLUCOSE 153*  --  181*  --  147*  --  113* 135* 133*  BUN 23  --  24*  --  23  --  _0 CREATININE 0.89  --  0.89  --  0.89  --  0.82 0.88 0.81  CALCIUM 7.6*  --  7.7*  --  7.4*  --  7.5* 7.6* 7.6*  MG 1.9  --   --  2.2 2.1  --   --  2.0 2.0  PHOS 2.3*  --   --  2.9 2.3*  --   --  2.2* 2.7   < > = values in this interval not displayed.    GFR: Estimated Creatinine Clearance: 74.2 mL/min (by C-G formula based on SCr of 0.81 mg/dL).  Liver Function Tests: Recent Labs  Lab 11/21/18 0851 11/22/18 1229 11/23/18 0355 11/24/18 0423 11/25/18 0357  AST 25 36 46* 54* 65*  ALT 21 32 41 48* 58*  ALKPHOS 87 75 79 76 86  BILITOT 1.4* 0.9 0.8 0.7 1.2  PROT 5.8* 5.3* 5.3* 4.8* 5.2*  ALBUMIN 2.0* 1.6* 1.7* 1.5* 1.7*   Recent Labs  Lab 11/21/18 0851 11/22/18 1229  LIPASE 20 18  AMYLASE 10* 8*   No results for input(s): AMMONIA in the last 168 hours.  Coagulation Profile: Recent Labs  Lab 11/21/18 0521 11/21/18 0851 11/22/18 1229  INR 1.20 1.33 1.39    Cardiac Enzymes: Recent Labs  Lab 11/21/18 0851 11/21/18 1350 11/21/18 1939  TROPONINI 0.06* 0.05* 0.04*    BNP (last 3 results) No results for input(s): PROBNP in the last 8760 hours.  HbA1C: No results for input(s): HGBA1C in the last 72 hours.  CBG: Recent Labs  Lab 11/25/18 0755 11/25/18 1141 11/25/18 1704 11/25/18 2236 11/26/18 0859  GLUCAP 110* 152* 180* 168* 111*    Lipid Profile: No results for input(s): CHOL, HDL, LDLCALC, TRIG, CHOLHDL, LDLDIRECT in the last 72 hours.  Thyroid Function Tests: No results for input(s): TSH, T4TOTAL, FREET4, T3FREE, THYROIDAB in the last 72 hours.  Anemia Panel: No results for input(s): VITAMINB12, FOLATE, FERRITIN, TIBC, IRON, RETICCTPCT in the last 72 hours.  Urine analysis:    Component Value Date/Time  COLORURINE YELLOW (A) 11/21/2018 0805   APPEARANCEUR CLOUDY (A) 11/21/2018 0805   LABSPEC >1.030 (H) 11/21/2018 0805   PHURINE 6.0 11/21/2018 0805   GLUCOSEU NEGATIVE 11/21/2018 0805   HGBUR SMALL (A) 11/21/2018 0805   BILIRUBINUR NEGATIVE  11/21/2018 0805   KETONESUR NEGATIVE 11/21/2018 0805   PROTEINUR 30 (A) 11/21/2018 0805   NITRITE NEGATIVE 11/21/2018 0805   LEUKOCYTESUR SMALL (A) 11/21/2018 0805    Sepsis Labs: Lactic Acid, Venous    Component Value Date/Time   LATICACIDVEN 1.2 11/23/2018 0355    MICROBIOLOGY: Recent Results (from the past 240 hour(s))  Culture, blood (Routine x 2)     Status: Abnormal   Collection Time: 11/21/18  5:21 AM  Result Value Ref Range Status   Specimen Description BLOOD RIGHT ANTECUBITAL  Final   Special Requests   Final    BOTTLES DRAWN AEROBIC AND ANAEROBIC Blood Culture adequate volume   Culture  Setup Time   Final    GRAM NEGATIVE RODS IN BOTH AEROBIC AND ANAEROBIC BOTTLES CRITICAL RESULT CALLED TO, READ BACK BY AND VERIFIED WITH: B MANCHERIL,PHARMD AT 2014 11/21/2018 BY L BENFIELD    Culture (A)  Final    ENTEROBACTER CLOACAE CORRECTED ON 02/10 AT 0920: PREVIOUSLY REPORTED AS ESCHERICHIA COLI CORRECTED RESULTS PREVIOUSLY REPORTED AS: ESCHERICHIA COLI CORRECTED RESULTS CALLED TO: RN Oliver Hum [id number] MLM Performed at [facility], [place] [address] [place], [place] [address]    Report Status 11/23/2018 FINAL  Final   Organism ID, Bacteria ENTEROBACTER CLOACAE  Final      Susceptibility   Enterobacter cloacae - MIC*    AMPICILLIN 16 INTERMEDIATE Intermediate     CEFAZOLIN >=64 RESISTANT Resistant     CEFEPIME <=1 SENSITIVE Sensitive     CEFTAZIDIME <=1 SENSITIVE Sensitive     CEFTRIAXONE <=1 SENSITIVE Sensitive     CIPROFLOXACIN <=0.25 SENSITIVE Sensitive     GENTAMICIN <=1 SENSITIVE Sensitive     IMIPENEM 0.5 SENSITIVE Sensitive     TRIMETH/SULFA <=20 SENSITIVE Sensitive     AMPICILLIN/SULBACTAM 8 SENSITIVE Sensitive     PIP/TAZO <=4 SENSITIVE Sensitive     Extended ESBL NEGATIVE Sensitive     * ENTEROBACTER CLOACAE CORRECTED ON 02/10 AT 0920: PREVIOUSLY REPORTED AS ESCHERICHIA COLI  Blood Culture ID Panel (Reflexed)     Status: Abnormal   Collection Time:  11/21/18  5:21 AM  Result Value Ref Range Status   Enterococcus species NOT DETECTED NOT DETECTED Final   Listeria monocytogenes NOT DETECTED NOT DETECTED Final   Staphylococcus species NOT DETECTED NOT DETECTED Final   Staphylococcus aureus (BCID) NOT DETECTED NOT DETECTED Final   Streptococcus species NOT DETECTED NOT DETECTED Final   Streptococcus agalactiae NOT DETECTED NOT DETECTED Final   Streptococcus pneumoniae NOT DETECTED NOT DETECTED Final   Streptococcus pyogenes NOT DETECTED NOT DETECTED Final   Acinetobacter baumannii NOT DETECTED NOT DETECTED Final   Enterobacteriaceae species DETECTED (A) NOT DETECTED Final    Comment: Enterobacteriaceae represent a large family of gram-negative bacteria, not a single organism. CRITICAL RESULT CALLED TO, READ BACK BY AND VERIFIED WITH: B MANCHERIL,PHARMD AT 2014 11/21/2018 BY L BENFIELD    Enterobacter cloacae complex DETECTED (A) NOT DETECTED Final    Comment: CRITICAL RESULT CALLED TO, READ BACK BY AND VERIFIED WITH: B MANCHERIL,PHARMD AT 2014 11/21/2018 BY L BENFIELD    Escherichia coli NOT DETECTED NOT DETECTED Final   Klebsiella oxytoca NOT DETECTED NOT DETECTED Final   Klebsiella pneumoniae NOT DETECTED NOT  DETECTED Final   Proteus species NOT DETECTED NOT DETECTED Final   Serratia marcescens NOT DETECTED NOT DETECTED Final   Carbapenem resistance NOT DETECTED NOT DETECTED Final   Haemophilus influenzae NOT DETECTED NOT DETECTED Final   Neisseria meningitidis NOT DETECTED NOT DETECTED Final   Pseudomonas aeruginosa NOT DETECTED NOT DETECTED Final   Candida albicans NOT DETECTED NOT DETECTED Final   Candida glabrata NOT DETECTED NOT DETECTED Final   Candida krusei NOT DETECTED NOT DETECTED Final   Candida parapsilosis NOT DETECTED NOT DETECTED Final   Candida tropicalis NOT DETECTED NOT DETECTED Final    Comment: Performed at [facility], [place] [address] [place], [place] [address]  Culture, blood (Routine x 2)     Status:  Abnormal   Collection Time: 11/21/18  5:22 AM  Result Value Ref Range Status   Specimen Description BLOOD BLOOD LEFT FOREARM  Final   Special Requests   Final    BOTTLES DRAWN AEROBIC AND ANAEROBIC Blood Culture adequate volume   Culture  Setup Time   Final    GRAM NEGATIVE RODS IN BOTH AEROBIC AND ANAEROBIC BOTTLES CRITICAL VALUE NOTED.  VALUE IS CONSISTENT WITH PREVIOUSLY REPORTED AND CALLED VALUE.    Culture (A)  Final    ENTEROBACTER CLOACAE SUSCEPTIBILITIES PERFORMED ON PREVIOUS CULTURE WITHIN THE LAST 5 DAYS. Performed at [facility], [place] [address], [place], [place] [address]    Report Status 11/23/2018 FINAL  Final  Culture, Urine     Status: Abnormal   Collection Time: 11/21/18  8:05 AM  Result Value Ref Range Status   Specimen Description URINE, CATHETERIZED  Final   Special Requests   Final    Normal Performed at [facility], [place] [address], [place], [place] [address]    Culture >=100,000 COLONIES/mL YEAST (A)  Final   Report Status 11/22/2018 FINAL  Final  Culture, blood (routine x 2)     Status: None   Collection Time: 11/21/18  9:00 AM  Result Value Ref Range Status   Specimen Description BLOOD RIGHT HAND  Final   Special Requests   Final    BOTTLES DRAWN AEROBIC ONLY Blood Culture results may not be optimal due to an excessive volume of blood received in culture bottles   Culture   Final    NO GROWTH 5 DAYS Performed at [facility], [place] [address], [place], [place] [address]    Report Status 11/26/2018 FINAL  Final  Culture, blood (routine x 2)     Status: None   Collection Time: 11/21/18  9:10 AM  Result Value Ref Range Status   Specimen Description BLOOD RIGHT WRIST  Final   Special Requests   Final    BOTTLES DRAWN AEROBIC ONLY Blood Culture results may not be optimal due to an inadequate volume of blood received in culture bottles   Culture   Final    NO GROWTH 5 DAYS Performed at [facility], [place] [address],  [place],  [address]    Report Status 11/26/2018 FINAL  Final  Respiratory Panel by PCR     Status: None   Collection Time: 11/21/18  9:18 AM  Result Value Ref Range Status   Adenovirus NOT DETECTED NOT DETECTED Final   Coronavirus 229E NOT DETECTED NOT DETECTED Final    Comment: (NOTE) The Coronavirus on the Respiratory Panel, DOES NOT test for the novel  Coronavirus (2019 nCoV)    Coronavirus HKU1 NOT DETECTED  NOT DETECTED Final   Coronavirus NL63 NOT DETECTED NOT DETECTED Final   Coronavirus OC43 NOT DETECTED NOT DETECTED Final   Metapneumovirus NOT DETECTED NOT DETECTED Final   Rhinovirus / Enterovirus NOT DETECTED NOT DETECTED Final   Influenza A NOT DETECTED NOT DETECTED Final   Influenza B NOT DETECTED NOT DETECTED Final   Parainfluenza Virus 1 NOT DETECTED NOT DETECTED Final   Parainfluenza Virus 2 NOT DETECTED NOT DETECTED Final   Parainfluenza Virus 3 NOT DETECTED NOT DETECTED Final   Parainfluenza Virus 4 NOT DETECTED NOT DETECTED Final   Respiratory Syncytial Virus NOT DETECTED NOT DETECTED Final   Bordetella pertussis NOT DETECTED NOT DETECTED Final   Chlamydophila pneumoniae NOT DETECTED NOT DETECTED Final   Mycoplasma pneumoniae NOT DETECTED NOT DETECTED Final    Comment: Performed at [facility], [place] [address], [place], [place] [address]  MRSA PCR Screening     Status: None   Collection Time: 11/21/18  9:18 AM  Result Value Ref Range Status   MRSA by PCR NEGATIVE NEGATIVE Final    Comment:        The GeneXpert MRSA Assay (FDA approved for NASAL specimens only), is one component of a comprehensive MRSA colonization surveillance program. It is not intended to diagnose MRSA infection nor to guide or monitor treatment for MRSA infections. Performed at [facility], [place] [address], [place], [place] [address]   Culture, respiratory (non-expectorated)     Status: None   Collection Time: 11/21/18  4:42 PM  Result Value Ref Range Status   Specimen  Description TRACHEAL ASPIRATE  Final   Special Requests Normal  Final   Gram Stain   Final    RARE WBC PRESENT, PREDOMINANTLY PMN RARE GRAM POSITIVE COCCI    Culture   Final    FEW Consistent with normal respiratory flora. Performed at [facility], [place] [address], [place], [place] [address]    Report Status 11/24/2018 FINAL  Final  Aerobic/Anaerobic Culture (surgical/deep wound)     Status: None (Preliminary result)   Collection Time: 11/22/18  2:56 PM  Result Value Ref Range Status   Specimen Description BILE  Final   Special Requests NONE  Final   Gram Stain   Final    FEW WBC PRESENT, PREDOMINANTLY PMN MODERATE GRAM NEGATIVE RODS RARE GRAM POSITIVE COCCI IN PAIRS RARE GRAM POSITIVE RODS Performed at [facility], [place] [address] [place], [place] [address]    Culture   Final    MODERATE ENTEROBACTER ASBURIAE MODERATE ENTEROCOCCUS FAECIUM NO ANAEROBES ISOLATED; CULTURE IN PROGRESS FOR 5 DAYS    Report Status PENDING  Incomplete   Organism ID, Bacteria ENTEROBACTER ASBURIAE  Final   Organism ID, Bacteria ENTEROCOCCUS FAECIUM  Final      Susceptibility   Enterobacter asburiae - MIC*    CEFAZOLIN >=64 RESISTANT Resistant     CEFEPIME <=1 SENSITIVE Sensitive     CEFTAZIDIME <=1 SENSITIVE Sensitive     CEFTRIAXONE <=1 SENSITIVE Sensitive     CIPROFLOXACIN <=0.25 SENSITIVE Sensitive     GENTAMICIN <=1 SENSITIVE Sensitive     IMIPENEM 0.5 SENSITIVE Sensitive     TRIMETH/SULFA <=20 SENSITIVE Sensitive     PIP/TAZO <=4 SENSITIVE Sensitive     * MODERATE ENTEROBACTER ASBURIAE   Enterococcus faecium - MIC*    AMPICILLIN <=2 SENSITIVE Sensitive     VANCOMYCIN <=0.5 SENSITIVE Sensitive     GENTAMICIN SYNERGY SENSITIVE Sensitive     *  MODERATE ENTEROCOCCUS FAECIUM    RADIOLOGY STUDIES/RESULTS: Ct Abdomen Pelvis Wo Contrast  Result Date: 11/21/2018 CLINICAL DATA:  Sepsis. EXAM: CT ABDOMEN AND PELVIS WITHOUT CONTRAST TECHNIQUE: Multidetector CT imaging of the abdomen  and pelvis was performed following the standard protocol without IV contrast. COMPARISON:  CT scan 09/04/2018 FINDINGS: Lower chest: The heart is upper limits normal in size for age. Coronary artery calcifications and evidence of prior TAVR. Small bilateral pleural effusions and bibasilar atelectasis or infiltrates. Hepatobiliary: No focal hepatic lesions are identified without contrast. Numerous gallstones are noted in the gallbladder. Suspect mild gallbladder wall thickening and mild pericholecystic inflammatory changes. Could not exclude acute cholecystitis. No common bile duct dilatation. Pancreas: Moderate atrophy but no mass or acute inflammation. Spleen: Normal size.  No focal lesions. Adrenals/Urinary Tract: The adrenal glands are unremarkable and stable. Bilateral renal cysts but no worrisome renal masses. No renal or obstructing ureteral calculi. There is a Foley catheter in a decompressed bladder. No bladder mass or bladder calculi. Stomach/Bowel: The stomach contains an NG tube. The duodenum, small bowel and colon are grossly normal. No acute inflammatory process, mass lesion or obstructive findings. Evidence of a prior right hemicolectomy. Sigmoid colon diverticulosis without findings for acute diverticulitis. Small amount of fluid noted in the right pericolic gutter and in the anterior pararenal space. Vascular/Lymphatic: Advanced atherosclerotic calcifications involving the aorta. 4.6 x 4.0 cm infrarenal abdominal aortic aneurysm with a left-sided saccular component. This measured a maximum of 4.5 cm on the prior study. Reproductive: The prostate gland and seminal vesicles are grossly normal. Other: Small amount of free fluid in the pelvis but no pelvic mass or adenopathy. No inguinal mass or adenopathy. Musculoskeletal: No significant bony findings. Stable compression fracture of L1 with a vertebral plana appearance. IMPRESSION: 1. Cholelithiasis with findings suspicious for acute cholecystitis.  Recommend correlation with right upper quadrant ultrasound examination. 2. Stable infrarenal abdominal aortic aneurysm with a left-sided saccular component. 3. Small bilateral pleural effusions and bibasilar atelectasis or infiltrates. Electronically Signed   By: Marijo Sanes M.D.   On: 11/21/2018 08:57   US Abdomen Complete  Result Date: 11/21/2018 CLINICAL DATA:  Acute cholecystitis an urinary tract infection. Patient is intubated. Elevated total bilirubin. EXAM: ABDOMEN ULTRASOUND COMPLETE COMPARISON:  CT on 11/21/2018 a prior CT exams including 12/07/2013 FINDINGS: Gallbladder: Gallbladder wall is irregular and thickened, measuring 7 millimeters. Dependent stones are present, measuring on the order of 1.7 centimeters or smaller. Common bile duct: Diameter: 5.4 millimeter Liver: The visualized portion of the liver is unremarkable. LEFT hepatic lobe is not well seen because of bowel gas. Portal vein is patent on color Doppler imaging with normal direction of blood flow towards the liver. IVC: Visualized portion normal. Pancreas: Obscured by bowel gas. Spleen: Obscured by bowel gas. Right Kidney: Length: 11.0 centimeters. 4.6 x 4.5 x 5.2 centimeter cyst containing internal septation. On CT exam 2015, this lesion measured 3.2 x 3.2 centimeters. No hydronephrosis. Left Kidney: Length: 12.3 centimeters. Simple cyst is 3.5 x 2.4 x 2.9 centimeters. No hydronephrosis. Abdominal aorta: Not visualized on the level of the mid abdominal aorta to the bifurcation segment 1 bowel gas. No visualized aneurysm. Other findings: Study quality is degraded by patient body habitus and intubation. Note is made of a small RIGHT pleural effusion. IMPRESSION: 1. Irregular, thickened gallbladder wall and multiple gallstones. Presence or absence of sonographic Murphy's can not be assessed due to the patient's intubated status. Findings are suspicious for acute cholecystitis however. 2. RIGHT pleural effusion. 3.  Complex cystic lesion  within the UPPER pole the RIGHT kidney. Lesion has progressed since prior studies. If possible and needed, consider further characterization with outpatient contrast-enhanced CT or MRI with intravenous contrast. Electronically Signed   By: Nolon Nations M.D.   On: 11/21/2018 12:46   Ir Perc Cholecystostomy  Result Date: 11/23/2018 INDICATION: 85 year old male with a history of cholecystitis EXAM: CHOLECYSTOSTOMY MEDICATIONS: In-hospital antibiotics ANESTHESIA/SEDATION: Moderate (conscious) sedation was employed during this procedure. A total of Versed 2.0 mg and Fentanyl 0 mcg was administered intravenously. Moderate Sedation Time: 0 minutes. The patient's level of consciousness and vital signs were monitored continuously by radiology nursing throughout the procedure under my direct supervision. FLUOROSCOPY TIME:  Fluoroscopy Time: 0 minutes 24 seconds (42.6 mGy). COMPLICATIONS: None PROCEDURE: Informed written consent was obtained from the patient and the patient's family after a thorough discussion of the procedural risks, benefits and alternatives. All questions were addressed. Maximal Sterile Barrier Technique was utilized including caps, mask, sterile gowns, sterile gloves, sterile drape, hand hygiene and skin antiseptic. A timeout was performed prior to the initiation of the procedure. Ultrasound survey of the right upper quadrant was performed for planning purposes. Once the patient is prepped and draped in the usual sterile fashion, the skin and subcutaneous tissues overlying the gallbladder were generously infiltrated 1% lidocaine for local anesthesia. A coaxial needle was advanced under ultrasound guidance through the skin subcutaneous tissues and a small segment of liver into the gallbladder lumen. With removal of the stylet, spontaneous dark bile drainage occurred. Using modified Seldinger technique, a 10 French drain was placed into the gallbladder fossa, with aspiration of the sample for the  lab. Contrast injection confirmed position of the tube within the gallbladder lumen. Drainage catheter was attached to gravity drain with a suture retention placed. Patient tolerated the procedure well and remained hemodynamically stable throughout. No complications were encountered and no significant blood loss encountered. IMPRESSION: Status post image guided percutaneous cholecystostomy. Signed, Dulcy Fanny. Dellia Nims, RPVI [facility] Electronically Signed   By: Corrie Mckusick D.O.   On: 11/23/2018 08:16   Dg Chest Port 1 View  Result Date: 11/23/2018 CLINICAL DATA:  Acute respiratory failure EXAM: PORTABLE CHEST 1 VIEW COMPARISON:  Two days ago FINDINGS: Endotracheal tube tip between the clavicular heads and carina. The orogastric tube reaches the stomach. Low volume chest with streaky density at the bases. There may be trace pleural fluid. No Kerley lines or pneumothorax. Cardiomegaly. Transcatheter aortic valve replacement IMPRESSION: Stable hardware positioning, low lung volumes, and presumed atelectasis. Electronically Signed   By: Monte Fantasia M.D.   On: 11/23/2018 06:53   Dg Chest Portable 1 View  Result Date: 11/21/2018 CLINICAL DATA:  Intubation. EXAM: PORTABLE CHEST 1 VIEW COMPARISON:  Earlier film, same date. FINDINGS: The endotracheal tube is right at the carina and should be retracted approximately 3 cm. The NG tube is coursing down the esophagus and into the stomach. Stable cardiac enlargement. Bibasilar atelectasis but no definite infiltrates or effusions. IMPRESSION: The endotracheal tube is right at the carina and should be retracted at least 3 cm. Bibasilar atelectasis but no edema or infiltrates Electronically Signed   By: Marijo Sanes M.D.   On: 11/21/2018 07:40   Dg Chest Port 1 View  Result Date: 11/21/2018 CLINICAL DATA:  Sepsis. EXAM: PORTABLE CHEST 1 VIEW COMPARISON:  09/16/2018 FINDINGS: The heart is enlarged but  stable. Marked tortuosity and calcification of the thoracic aorta. Bibasilar atelectasis. No definite  pleural effusions. No obvious infiltrates. IMPRESSION: Cardiac enlargement and marked tortuosity and calcification of the thoracic aorta. No definite infiltrates, effusions or edema. Streaky basilar atelectasis. Electronically Signed   By: Marijo Sanes M.D.   On: 11/21/2018 06:21     LOS: 5 days   Oren Binet, MD  [facility]  If 7PM-7AM, please contact night-coverage  Please page via [email or web address]  Go to [email or web address] and use [place]'s universal password to access. If you do not have the password, please contact the hospital operator.  Locate the [facility] provider you are looking for under [facility] and page to a number that you can be directly reached. If you still have difficulty reaching the provider, please page the [facility] (Director on Call) for the Hospitalists listed on amion for assistance.  11/26/2018, 11:39 AM

## 2018-11-26 NOTE — Evaluation (Signed)
Occupational Therapy Evaluation Patient Details Name: William [name] MRN: [id number] DOB: 13-Oct-1928 Today's Date: 11/26/2018    History of Present Illness William [name] is an 57DD male who comes to [facility] from [place] [facility] [place] after 15hrs fever, chills, emesis, AMS, required intubation, extubated on 2/10. Pt found to have bactremia, as well as cholagitis, s/p percutaneous chole drain placement 11/22/08. PTA pt has been at [facility] for STR x8 weeks, had progressed to the poitn of AMB 5-7 steps per bout, but still not back to baseline of household distance AMB (prior to Novemebr 2019).    Clinical Impression   Eval limited due to transport coming tin to pick up pt during bed mobility to attempt EOB sitting. Pt's daughter present and reports that [name]. Pt currently requires extensive assist with bed mobility and ADLs at bed level. Pt demos decreased strength, balance, endurance with hx of cognitive impairments and would benefit from acute OT services to address impairments to maximize level of function and safety    Follow Up Recommendations  SNF;Supervision/Assistance - 24 hour    Equipment Recommendations  Other (comment)(TBD at next venue of care)    Recommendations for Other Services       Precautions / Restrictions Precautions Precautions: Fall Restrictions Weight Bearing Restrictions: No      Mobility Bed Mobility Overal bed mobility: Needs Assistance Bed Mobility: Supine to Sit;Sit to Supine     Supine to sit: +2 for physical assistance;Total assist Sit to supine: Total assist;+2 for physical assistance   General bed mobility comments: pt attempting, but unable to move LEs without extensive assist. Transport in to pick up pt, session ceased  Transfers                 General transfer comment: NT    Balance       Sitting balance - Comments: NT                                   ADL either  performed or assessed with clinical judgement   ADL Overall ADL's : Needs assistance/impaired Eating/Feeding: Minimal assistance   Grooming: Moderate assistance;Sitting;Bed level   Upper Body Bathing: Maximal assistance;Bed level   Lower Body Bathing: Total assistance   Upper Body Dressing : Maximal assistance;Bed level   Lower Body Dressing: Total assistance       Toileting- Clothing Manipulation and Hygiene: Total assistance;Bed level               Vision         Perception     Praxis      Pertinent Vitals/Pain Pain Assessment: No/denies pain Faces Pain Scale: Hurts even more Pain Location: genral mobility Pain Descriptors / Indicators: Aching;Grimacing;Guarding Pain Intervention(s): Limited activity within patient's tolerance;Monitored during session;Repositioned     Hand Dominance Right   Extremity/Trunk Assessment Upper Extremity Assessment Upper Extremity Assessment: Generalized weakness;RUE deficits/detail RUE Deficits / Details: R hand painful from injury weeks ago RUE: Unable to fully assess due to pain   Lower Extremity Assessment Lower Extremity Assessment: Defer to PT evaluation       Communication Communication Communication: HOH   Cognition Arousal/Alertness: Awake/alert   Overall Cognitive Status: Impaired/Different from baseline Area of Impairment: Orientation;Following commands;Memory;Problem solving                     Memory: Decreased recall  of precautions;Decreased short-term memory Following Commands: Follows one step commands consistently     Problem Solving: Slow processing;Requires tactile cues;Requires verbal cues;Decreased initiation     General Comments       Exercises     Shoulder Instructions      Home Living Family/patient expects to be discharged to:: Skilled nursing facility                                 Additional Comments: pt's family assisting with providing PLOF      Prior  Functioning/Environment Level of Independence: Needs assistance  Gait / Transfers Assistance Needed: Supervision with use of RW for limited household ambulation; Pt immediately prior to admission AMB in hallway  ADL's / Homemaking Assistance Needed: [place] for ADL in november, wears depends 2/2 PrCA. Communication / Swallowing Assistance Needed: HOH           OT Problem List: Decreased strength;Decreased activity tolerance;Decreased cognition;Decreased knowledge of use of DME or AE;Impaired UE functional use;Impaired balance (sitting and/or standing);Decreased safety awareness;Decreased knowledge of precautions;Decreased coordination      OT Treatment/Interventions:      OT Goals(Current goals can be found in the care plan section) Acute Rehab OT Goals Patient Stated Goal: return to independent household distance AMB  ADL Goals Pt Will Perform Grooming: with min assist;sitting Pt Will Perform Upper Body Bathing: with mod assist;sitting Pt Will Perform Upper Body Dressing: with mod assist;sitting Pt Will Transfer to Toilet: with max assist;stand pivot transfer;bedside commode Additional ADL Goal #1: Pt will complete bed mobility max - mod A to sit EOB in prep for grooming and UB ADLs  OT Frequency:     Barriers to D/C:            Co-evaluation              AM-PAC OT "6 Clicks" Daily Activity     Outcome Measure Help from another person eating meals?: A Little Help from another person taking care of personal grooming?: A Lot Help from another person toileting, which includes using toliet, bedpan, or urinal?: Total Help from another person bathing (including washing, rinsing, drying)?: Total Help from another person to put on and taking off regular upper body clothing?: Total Help from another person to put on and taking off regular lower body clothing?: Total 6 Click Score: 9   End of Session    Activity Tolerance: Patient limited by fatigue Patient left: in bed;with  family/visitor present;Other (comment)(transport staff in to pick up pt)  OT Visit Diagnosis: Muscle weakness (generalized) (M62.81);Cognitive communication deficit (R41.841);Other symptoms and signs involving cognitive function;Pain;Other abnormalities of gait and mobility (R26.89);History of falling (Z91.81) Pain - Right/Left: Right Pain - part of body: Hand                Time: 1700-1749 OT Time Calculation (min): 16 min Charges:  OT General Charges $OT Visit: 1 Visit OT Evaluation $OT Eval Moderate Complexity: 1 Mod    Britt Bottom 11/26/2018, 12:29 PM

## 2018-11-26 NOTE — Interval H&P Note (Signed)
History and Physical Interval Note:  11/26/2018 10:07 AM  William [name]  has presented today for surgery, with the diagnosis of BACTERMIA  The various methods of treatment have been discussed with the patient and family. After consideration of risks, benefits and other options for treatment, the patient has consented to  Procedure(s): TRANSESOPHAGEAL ECHOCARDIOGRAM (TEE) (N/A) as a surgical intervention .  The patient's history has been reviewed, patient examined, no change in status, stable for surgery.  I have reviewed the patient's chart and labs.  Questions were answered to the patient's satisfaction.     [employer]

## 2018-11-26 NOTE — Consult Note (Signed)
   [facility] CM Inpatient Consult   0/30/1314  Ulus Hazen Buckel [birth year] [id number]  Patient was assessed for extreme high risk for unplanned readmissions in the [place] with Medicare.  Chart review reveals from progress notes of MD and FNP: [name] is a 85 y.o. 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 Mr. Kreg Shropshire was previously been treated for ESBL producing Klebsiella bacteremia from ICD leads following his TAVR in November 2019. He had no endocarditis at the time. He was treated with 6 weeks of Invanz with end date of 10/27/18.  Intubated for airway protection in the ED with presumed sepsis as he was hypotensive. Chest x-ray with cardiac enlargement and calcification of the thoracic aorta. Patient is currently for a TEE today.  Will follow for disposition and needs, as appropriate for community care management.    Of note, [facility] Care Management services does not replace or interfere with any services that are arranged by inpatient case management or social work. For additional questions or referrals please contact:   Natividad Brood, RN BSN [facility] Liaison  [phone] business mobile phone Toll free office [phone]

## 2018-11-26 NOTE — CV Procedure (Signed)
   Transesophageal Echocardiogram  Indications: bacteremia  Time out performed  During this procedure the patient is administered a total of Versed 1 mg and Fentanyl 12.5 mcg to achieve and maintain moderate conscious sedation.  The patient's heart rate, blood pressure, and oxygen saturation are monitored continuously during the procedure. The period of conscious sedation is 7 minutes, of which I was present face-to-face 100% of this time.  Findings:  Left Ventricle: 25%-30% EF  Mitral Valve: Mild to moderate MR  Aortic Valve: TAVR, normal  Tricuspid Valve: normal (poorly visualized)  Left Atrium: normal no thrombus  IMPRESSION: No vegetations, no endocarditis.   Candee Furbish, MD

## 2018-11-26 NOTE — Progress Notes (Signed)
  Speech Language Pathology Treatment: Dysphagia  Patient Details Name: William [name] MRN: [id number] DOB: 13-Nov-1927 Today's Date: 11/26/2018 Time: 5500-1642 SLP Time Calculation (min) (ACUTE ONLY): 11 min  Assessment / Plan / Recommendation Clinical Impression  Skilled treatment session focused on dysphagia goals. SLP facilitated session by providing observation of pt consuming 4 oz thin liquids via straw. Pt with no overt s/s of aspiration. Pt's wife present and doesn't endorse any overt s/s of aspiration or dysphagia when consuming breakfast and lunch. At this point, pt appears safe on current diet with no further services needed for dysphagia. Wife in agreement.  ST to sign off at this time.    HPI HPI: Pt is 85 year old male nursing home resident with PMH of bacteremia, ischemic cardiomyophathym AAA repair, HTN, prostate cancer and ventricular tachycardia, who presents to ED with approximately 15 hours of shakes, chills, sweats, fever and vomiting x1 with questionable aspiration.  In ED was found to have altered mental status and inability to protect airway and was intubated.  Was also found to be hypotensive presumed septic. Intubated 2/8- 2/10.       SLP Plan  Discharge SLP treatment due to (comment)(goals met)       Recommendations  Diet recommendations: Regular;Thin liquid Liquids provided via: Cup;Straw Medication Administration: Whole meds with puree Supervision: Patient able to self feed Compensations: Slow rate;Small sips/bites Postural Changes and/or Swallow Maneuvers: Seated upright 90 degrees                Oral Care Recommendations: Oral care BID SLP Visit Diagnosis: Dysphagia, oropharyngeal phase (R13.12) Plan: Discharge SLP treatment due to (comment)(goals met)       GO                Eithel Ryall 11/26/2018, 2:09 PM

## 2018-11-26 NOTE — Progress Notes (Signed)
Patient's family (wife and two daughters) stated that they want patient to wear Depends briefs (family is providing briefs) instead of condom catheter.

## 2018-11-26 NOTE — Clinical Social Work Note (Signed)
Clinical Social Work Assessment  Patient Details  Name: William [name] MRN: [id number] Date of Birth: 1928-03-14  Date of referral:  11/26/18               Reason for consult:  Facility Placement                Permission sought to share information with:  Facility Sport and exercise psychologist, Family Supports Permission granted to share information::  Yes, Verbal Permission Granted  Name::     Radiation protection practitioner::  [facility]  Relationship::  Spouse  Contact Information:  [phone]  Housing/Transportation Living arrangements for the past 2 months:  [place] of Information:  Spouse, Patient Patient Interpreter Needed:  None Criminal Activity/Legal Involvement Pertinent to Current Situation/Hospitalization:  No - Comment as needed Significant Relationships:  Spouse, Adult Children Lives with:  Spouse Do you feel safe going back to the place where you live?  Yes Need for family participation in patient care:  Yes (Comment)  Care giving concerns:  CSW received consult for possible SNF placement at time of discharge. CSW spoke with patient and spouse at bedside regarding PT recommendation of SNF placement at time of discharge. Patient's spouse reported that patient has been at [facility] for rehab for the past two months and will return at discharge. CSW to continue to follow and assist with discharge planning needs.   Social Worker assessment / plan:  CSW spoke with patient and spouse concerning possibility of rehab at [facility] before returning home.  Employment status:  Retired Forensic scientist:  Medicare PT Recommendations:  [place] / Referral to community resources:  [place]  Patient/Family's Response to care:  Patient's spouse reports agreement with discharge back to [facility]   Patient/Family's Understanding of and Emotional Response to Diagnosis, Current Treatment, and Prognosis:  Patient/family is realistic  regarding therapy needs and expressed being hopeful for SNF placement. Patient's spouse expressed understanding of CSW role and discharge process as well as medical condition. No questions/concerns about plan or treatment.    Emotional Assessment Appearance:  Appears stated age Attitude/Demeanor/Rapport:  Gracious Affect (typically observed):  Quiet Orientation:  Oriented to Self, Oriented to Place, Oriented to  Time, Oriented to Situation Alcohol / Substance use:  Not Applicable Psych involvement (Current and /or in the community):  No (Comment)  Discharge Needs  Concerns to be addressed:  Care Coordination Readmission within the last 30 days:  No Current discharge risk:  None Barriers to Discharge:  Continued Medical Work up   [employer], LCSW 11/26/2018, 2:54 PM

## 2018-11-26 NOTE — Progress Notes (Signed)
PHARMACY CONSULT NOTE FOR:  OUTPATIENT  PARENTERAL ANTIBIOTIC THERAPY (OPAT)  Indication:  Enterobacter bacteremia secondary to cholecystitis Regimen: Zosyn 3.375 gm every 8 hours  End date: 12/05/2018 (2 weeks of therapy total)  IV antibiotic discharge orders are pended. To discharging provider:  please sign these orders via discharge navigator,  Select New Orders & click on the button choice - Manage This Unsigned Work.     Thank you for allowing pharmacy to be a part of this patient's care.  Jimmy Footman, PharmD, BCPS, BCIDP Infectious Diseases Clinical Pharmacist Phone: [phone] 11/26/2018, 1:56 PM

## 2018-11-26 NOTE — Progress Notes (Signed)
Patient down in endo for TEE.  Discussed with Dr. Sloan Leiter.  Patient is stable with his perc chole drain in place.  We will have him return to see [place] in about 6 weeks to further determine a surgical plan.  No further surgical plans at this time.  We will sign off.  Please call back if needed.  All follow up will be arranged for the patient and information placed in follow up section.  He will need to see IR in 4-6 weeks for a drain injection.  They will set this up.  Henreitta Cea 9:50 AM 11/26/2018

## 2018-11-26 NOTE — Progress Notes (Signed)
[facility] [facility] for Infectious Disease  Date of Admission:  11/21/2018     Total days of antibiotics 7         ASSESSMENT/PLAN  Mr. 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 Sensitivities are susceptible to piperacillin-tazobactam for both organisms. TEE negative for endocarditis. He currently has a midline in place and disposition is back to rehabilitation/skilled nursing. Will plan for 2 weeks of IV therapy with piperacillin-tazobactam.   1. Continue current piperacillin-tazobactam.  2. OPAT orders placed. 3. Follow up in the ID clinic in 2 weeks prior to completion of antibiotics.   Diagnosis: Polymicrobial Cholangitis / Enterbacter Cloace Bacteremia  Culture Result: Enterobacter Cloacae, Enterobacter asburiae, Enterococcus faecalis.   Allergies  Allergen Reactions  . Crestor [Rosuvastatin] Other (See Comments)    Hurts muscles  . Lipitor [Atorvastatin] Other (See Comments)    Hurts stomach  . Shrimp [Shellfish Allergy] Other (See Comments)    On MAR    OPAT Orders Discharge antibiotics: Piperacillin-tazobactam Per pharmacy protocol   Duration: 2 weeks  End Date: 12/05/18  [facility] Care Per Protocol:  Labs weekly while on IV antibiotics: _X_ CBC with differential _X_ BMP __ CMP __ CRP __ ESR __ Vancomycin trough __ CK  __ Please pull PIC at completion of IV antibiotics _X_ Please leave PIC in place until doctor has seen patient or been notified  Fax weekly labs to [phone]  Clinic Follow Up Appt:  12/03/18 with Terri Piedra, NP   Principal Problem:   Cholangitis Active Problems:   Acute respiratory failure ([place])   Sepsis ([place])   Shock ([place])   Pressure injury of skin   Bacteremia due to Enterobacter species   Enterococcus faecalis infection   . amiodarone  100 mg Oral Daily  . aspirin  81 mg Oral Daily  . chlorhexidine gluconate (MEDLINE KIT)  15 mL Mouth Rinse BID  .  clopidogrel  75 mg Oral Q breakfast  . feeding supplement (ENSURE ENLIVE)  237 mL Oral BID BM  . heparin  5,000 Units Subcutaneous Q8H  . insulin aspart  0-9 Units Subcutaneous TID AC & HS  . polyethylene glycol  17 g Oral Daily  . sodium chloride flush  10-40 mL Intracatheter Q12H  . sodium chloride flush  5 mL Intracatheter Q8H    SUBJECTIVE:  Afebrile overnight with WBC count of 5.8. No acute events. TEE without evidence of endocarditis. Cultures with enterococcus faecalis and enterobacter asburaie with sensitivities supporting continued use of Zosyn.  Mr. Quebedeaux is resting in the bed with his wife and caregiver present. Daughter is on the phone.    Allergies  Allergen Reactions  . Crestor [Rosuvastatin] Other (See Comments)    Hurts muscles  . Lipitor [Atorvastatin] Other (See Comments)    Hurts stomach  . Shrimp [Shellfish Allergy] Other (See Comments)    On MAR     Review of Systems: Review of Systems  Constitutional: Negative for chills and fever.  Respiratory: Negative for cough, shortness of breath and wheezing.   Cardiovascular: Negative for chest pain and leg swelling.  Gastrointestinal: Negative for abdominal pain, constipation, diarrhea, nausea and vomiting.  Skin: Negative for rash.      OBJECTIVE: Vitals:   11/26/18 1104 11/26/18 1110 11/26/18 1115 11/26/18 1453  BP: 103/68  (!) 101/54 106/71  Pulse: 86 92 85 94  Resp: (!) 22 (!) '26 20 16  ' Temp: 98.1 F (36.7 C)   97.6 F (36.4 C)  TempSrc: Oral   Oral  SpO2: 96% 97% 96% 97%  Weight:      Height:       Body mass index is 32.39 kg/m.  Physical Exam Constitutional:      General: He is not in acute distress.    Appearance: He is well-developed.     Comments: Elderly gentleman lying in the bed with the head of bed elevated; resting but easily aroused.   Cardiovascular:     Rate and Rhythm: Normal rate and regular rhythm.     Heart sounds: Murmur present.  Pulmonary:     Effort: Pulmonary  effort is normal.     Breath sounds: Normal breath sounds.  Skin:    General: Skin is warm and dry.  Neurological:     Mental Status: He is alert.     Lab Results Lab Results  Component Value Date   WBC 5.8 11/26/2018   HGB 9.9 (L) 11/26/2018   HCT 30.4 (L) 11/26/2018   MCV 93.3 11/26/2018   PLT 137 (L) 11/26/2018    Lab Results  Component Value Date   CREATININE 0.81 11/26/2018   BUN 12 11/26/2018   NA 138 11/26/2018   K 3.1 (L) 11/26/2018   CL 109 11/26/2018   CO2 24 11/26/2018    Lab Results  Component Value Date   ALT 58 (H) 11/25/2018   AST 65 (H) 11/25/2018   ALKPHOS 86 11/25/2018   BILITOT 1.2 11/25/2018     Microbiology: Recent Results (from the past 240 hour(s))  Culture, blood (Routine x 2)     Status: Abnormal   Collection Time: 11/21/18  5:21 AM  Result Value Ref Range Status   Specimen Description BLOOD RIGHT ANTECUBITAL  Final   Special Requests   Final    BOTTLES DRAWN AEROBIC AND ANAEROBIC Blood Culture adequate volume   Culture  Setup Time   Final    GRAM NEGATIVE RODS IN BOTH AEROBIC AND ANAEROBIC BOTTLES CRITICAL RESULT CALLED TO, READ BACK BY AND VERIFIED WITH: B MANCHERIL,PHARMD AT 2014 11/21/2018 BY L BENFIELD    Culture (A)  Final    ENTEROBACTER CLOACAE CORRECTED ON 02/10 AT 0920: PREVIOUSLY REPORTED AS ESCHERICHIA COLI CORRECTED RESULTS PREVIOUSLY REPORTED AS: ESCHERICHIA COLI CORRECTED RESULTS CALLED TO: RN Oliver Hum [id number] Performed at [facility], [place] [address], [place], [place] [address]    Report Status 11/23/2018 FINAL  Final   Organism ID, Bacteria ENTEROBACTER CLOACAE  Final      Susceptibility   Enterobacter cloacae - MIC*    AMPICILLIN 16 INTERMEDIATE Intermediate     CEFAZOLIN >=64 RESISTANT Resistant     CEFEPIME <=1 SENSITIVE Sensitive     CEFTAZIDIME <=1 SENSITIVE Sensitive     CEFTRIAXONE <=1 SENSITIVE Sensitive     CIPROFLOXACIN <=0.25 SENSITIVE Sensitive     GENTAMICIN <=1 SENSITIVE Sensitive       IMIPENEM 0.5 SENSITIVE Sensitive     TRIMETH/SULFA <=20 SENSITIVE Sensitive     AMPICILLIN/SULBACTAM 8 SENSITIVE Sensitive     PIP/TAZO <=4 SENSITIVE Sensitive     Extended ESBL NEGATIVE Sensitive     * ENTEROBACTER CLOACAE CORRECTED ON 02/10 AT 0920: PREVIOUSLY REPORTED AS ESCHERICHIA COLI  Blood Culture ID Panel (Reflexed)     Status: Abnormal   Collection Time: 11/21/18  5:21 AM  Result Value Ref Range Status   Enterococcus species NOT DETECTED NOT DETECTED Final   Listeria monocytogenes NOT DETECTED NOT DETECTED Final   Staphylococcus  species NOT DETECTED NOT DETECTED Final   Staphylococcus aureus (BCID) NOT DETECTED NOT DETECTED Final   Streptococcus species NOT DETECTED NOT DETECTED Final   Streptococcus agalactiae NOT DETECTED NOT DETECTED Final   Streptococcus pneumoniae NOT DETECTED NOT DETECTED Final   Streptococcus pyogenes NOT DETECTED NOT DETECTED Final   Acinetobacter baumannii NOT DETECTED NOT DETECTED Final   Enterobacteriaceae species DETECTED (A) NOT DETECTED Final    Comment: Enterobacteriaceae represent a large family of gram-negative bacteria, not a single organism. CRITICAL RESULT CALLED TO, READ BACK BY AND VERIFIED WITH: B MANCHERIL,PHARMD AT 2014 11/21/2018 BY L BENFIELD    Enterobacter cloacae complex DETECTED (A) NOT DETECTED Final    Comment: CRITICAL RESULT CALLED TO, READ BACK BY AND VERIFIED WITH: B MANCHERIL,PHARMD AT 2014 11/21/2018 BY L BENFIELD    Escherichia coli NOT DETECTED NOT DETECTED Final   Klebsiella oxytoca NOT DETECTED NOT DETECTED Final   Klebsiella pneumoniae NOT DETECTED NOT DETECTED Final   Proteus species NOT DETECTED NOT DETECTED Final   Serratia marcescens NOT DETECTED NOT DETECTED Final   Carbapenem resistance NOT DETECTED NOT DETECTED Final   Haemophilus influenzae NOT DETECTED NOT DETECTED Final   Neisseria meningitidis NOT DETECTED NOT DETECTED Final   Pseudomonas aeruginosa NOT DETECTED NOT DETECTED Final   Candida albicans  NOT DETECTED NOT DETECTED Final   Candida glabrata NOT DETECTED NOT DETECTED Final   Candida krusei NOT DETECTED NOT DETECTED Final   Candida parapsilosis NOT DETECTED NOT DETECTED Final   Candida tropicalis NOT DETECTED NOT DETECTED Final    Comment: Performed at [facility], [place]. [address] [place], [place] [address]  Culture, blood (Routine x 2)     Status: Abnormal   Collection Time: 11/21/18  5:22 AM  Result Value Ref Range Status   Specimen Description BLOOD BLOOD LEFT FOREARM  Final   Special Requests   Final    BOTTLES DRAWN AEROBIC AND ANAEROBIC Blood Culture adequate volume   Culture  Setup Time   Final    GRAM NEGATIVE RODS IN BOTH AEROBIC AND ANAEROBIC BOTTLES CRITICAL VALUE NOTED.  VALUE IS CONSISTENT WITH PREVIOUSLY REPORTED AND CALLED VALUE.    Culture (A)  Final    ENTEROBACTER CLOACAE SUSCEPTIBILITIES PERFORMED ON PREVIOUS CULTURE WITHIN THE LAST 5 DAYS. Performed at [facility], [place] [address], [place], [place] [address]    Report Status 11/23/2018 FINAL  Final  Culture, Urine     Status: Abnormal   Collection Time: 11/21/18  8:05 AM  Result Value Ref Range Status   Specimen Description URINE, CATHETERIZED  Final   Special Requests   Final    Normal Performed at [facility], [place] [address], [place], [place] [address]    Culture >=100,000 COLONIES/mL YEAST (A)  Final   Report Status 11/22/2018 FINAL  Final  Culture, blood (routine x 2)     Status: None   Collection Time: 11/21/18  9:00 AM  Result Value Ref Range Status   Specimen Description BLOOD RIGHT HAND  Final   Special Requests   Final    BOTTLES DRAWN AEROBIC ONLY Blood Culture results may not be optimal due to an excessive volume of blood received in culture bottles   Culture   Final    NO GROWTH 5 DAYS Performed at [facility], [place] [address] [place], [place] [address]    Report Status 11/26/2018 FINAL  Final  Culture, blood (routine x 2)  Status: None   Collection  Time: 11/21/18  9:10 AM  Result Value Ref Range Status   Specimen Description BLOOD RIGHT WRIST  Final   Special Requests   Final    BOTTLES DRAWN AEROBIC ONLY Blood Culture results may not be optimal due to an inadequate volume of blood received in culture bottles   Culture   Final    NO GROWTH 5 DAYS Performed at [facility], [place] [address] [place], [place] [address]    Report Status 11/26/2018 FINAL  Final  Respiratory Panel by PCR     Status: None   Collection Time: 11/21/18  9:18 AM  Result Value Ref Range Status   Adenovirus NOT DETECTED NOT DETECTED Final   Coronavirus 229E NOT DETECTED NOT DETECTED Final    Comment: (NOTE) The Coronavirus on the Respiratory Panel, DOES NOT test for the novel  Coronavirus (2019 nCoV)    Coronavirus HKU1 NOT DETECTED NOT DETECTED Final   Coronavirus NL63 NOT DETECTED NOT DETECTED Final   Coronavirus OC43 NOT DETECTED NOT DETECTED Final   Metapneumovirus NOT DETECTED NOT DETECTED Final   Rhinovirus / Enterovirus NOT DETECTED NOT DETECTED Final   Influenza A NOT DETECTED NOT DETECTED Final   Influenza B NOT DETECTED NOT DETECTED Final   Parainfluenza Virus 1 NOT DETECTED NOT DETECTED Final   Parainfluenza Virus 2 NOT DETECTED NOT DETECTED Final   Parainfluenza Virus 3 NOT DETECTED NOT DETECTED Final   Parainfluenza Virus 4 NOT DETECTED NOT DETECTED Final   Respiratory Syncytial Virus NOT DETECTED NOT DETECTED Final   Bordetella pertussis NOT DETECTED NOT DETECTED Final   Chlamydophila pneumoniae NOT DETECTED NOT DETECTED Final   Mycoplasma pneumoniae NOT DETECTED NOT DETECTED Final    Comment: Performed at [facility], [address] [place], [place] [address]  MRSA PCR Screening     Status: None   Collection Time: 11/21/18  9:18 AM  Result Value Ref Range Status   MRSA by PCR NEGATIVE NEGATIVE Final    Comment:        The GeneXpert MRSA Assay (FDA approved for NASAL specimens only), is one component of a comprehensive  MRSA colonization surveillance program. It is not intended to diagnose MRSA infection nor to guide or monitor treatment for MRSA infections. Performed at [facility], [place] [address] [place], [place] [address]   Culture, respiratory (non-expectorated)     Status: None   Collection Time: 11/21/18  4:42 PM  Result Value Ref Range Status   Specimen Description TRACHEAL ASPIRATE  Final   Special Requests Normal  Final   Gram Stain   Final    RARE WBC PRESENT, PREDOMINANTLY PMN RARE GRAM POSITIVE COCCI    Culture   Final    FEW Consistent with normal respiratory flora. Performed at [facility], [place] [address], [place], [place] [address]    Report Status 11/24/2018 FINAL  Final  Aerobic/Anaerobic Culture (surgical/deep wound)     Status: None (Preliminary result)   Collection Time: 11/22/18  2:56 PM  Result Value Ref Range Status   Specimen Description BILE  Final   Special Requests NONE  Final   Gram Stain   Final    FEW WBC PRESENT, PREDOMINANTLY PMN MODERATE GRAM NEGATIVE RODS RARE GRAM POSITIVE COCCI IN PAIRS RARE GRAM POSITIVE RODS Performed at [facility], [place] [address], [place], [place] [address]    Culture   Final    MODERATE ENTEROBACTER  ASBURIAE MODERATE ENTEROCOCCUS FAECIUM NO ANAEROBES ISOLATED; CULTURE IN PROGRESS FOR 5 DAYS    Report Status PENDING  Incomplete   Organism ID, Bacteria ENTEROBACTER ASBURIAE  Final   Organism ID, Bacteria ENTEROCOCCUS FAECIUM  Final      Susceptibility   Enterobacter asburiae - MIC*    CEFAZOLIN >=64 RESISTANT Resistant     CEFEPIME <=1 SENSITIVE Sensitive     CEFTAZIDIME <=1 SENSITIVE Sensitive     CEFTRIAXONE <=1 SENSITIVE Sensitive     CIPROFLOXACIN <=0.25 SENSITIVE Sensitive     GENTAMICIN <=1 SENSITIVE Sensitive     IMIPENEM 0.5 SENSITIVE Sensitive     TRIMETH/SULFA <=20 SENSITIVE Sensitive     PIP/TAZO <=4 SENSITIVE Sensitive     * MODERATE ENTEROBACTER ASBURIAE   Enterococcus faecium - MIC*     AMPICILLIN <=2 SENSITIVE Sensitive     VANCOMYCIN <=0.5 SENSITIVE Sensitive     GENTAMICIN SYNERGY SENSITIVE Sensitive     * MODERATE ENTEROCOCCUS FAECIUM     Terri Piedra, NP [facility] for Infectious Disease [place] [phone] Pager  11/26/2018  3:48 PM

## 2018-11-26 NOTE — Progress Notes (Signed)
Arrived to discuss PICC placement with family. Family at meal at present. Primary RN stated that Infectious diseases MD had told family a PICC would not be needed. IV Team to follow up on 2-14 AM to see if order cancelled or if a PICC is still needed.

## 2018-11-26 NOTE — NC FL2 (Signed)
[place] LEVEL OF CARE SCREENING TOOL     IDENTIFICATION  Patient Name: William [name] Birthdate: [birth year] Sex: male Admission Date (Current Location): 11/21/2018  [facility] and [place] Number:  Herbalist and Address:  [facility], [place] [address] [place], [place] [address]      Provider Number: [id number]  Attending Physician Name and Address:  Jonetta Osgood, MD  Relative Name and Phone Number:  Webb Silversmith, spouse, [phone]    Current Level of Care: Hospital Recommended Level of Care: [place] Prior Approval Number:    Date Approved/Denied:   PASRR Number:    Discharge Plan: SNF    Current Diagnoses: Patient Active Problem List   Diagnosis Date Noted  . Cholangitis 11/25/2018  . Bacteremia due to Enterobacter species 11/24/2018  . Enterococcus faecalis infection 11/24/2018  . Pressure injury of skin 11/22/2018  . Shock ([place]) 11/21/2018  . Coronary artery disease involving native coronary artery of native heart without angina pectoris 09/22/2018  . Closed fracture of lower end of right ulna 09/22/2018  . Weakness acquired in ICU 09/22/2018  . Acute bacterial endocarditis   . 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 Bacteremia due to Klebsiella pneumoniae   . Sepsis ([place]) 09/04/2018  . S/P TAVR (transcatheter aortic valve replacement) 08/18/2018  . Ventricular tachycardia ([place])   . Dementia ([place])   . Acute respiratory failure ([place])   . Chronic systolic CHF (congestive heart failure) ([place]) 08/07/2018  . Severe aortic stenosis   . Acute on chronic systolic heart failure ([place]) 07/02/2018  . Prostate cancer ([place]) 01/20/2014  . S/P ICD (internal cardiac defibrillator) procedure, 01/11/14 removal of ERI gen and placement of Medtronic Evera XT VR & NEW Right ventricular lead Medtronic 01/12/2014  . Nonischemic cardiomyopathy ([place]) 05/28/2013  . Hyperlipidemia 05/28/2013  . AAA (abdominal aortic aneurysm)  ([place]) 05/28/2013    Orientation RESPIRATION BLADDER Height & Weight     Time, Self, Situation, Place  Normal Incontinent, External catheter Weight: 102.4 kg Height:  '5\' 10"'  (177.8 cm)  BEHAVIORAL SYMPTOMS/MOOD NEUROLOGICAL BOWEL NUTRITION STATUS      Continent Diet(Please see DC Summary)  AMBULATORY STATUS COMMUNICATION OF NEEDS Skin   Extensive Assist Verbally PU Stage and Appropriate Care                       Personal Care Assistance Level of Assistance  Bathing, Feeding, Dressing Bathing Assistance: Maximum assistance Feeding assistance: Limited assistance Dressing Assistance: Limited assistance     Functional Limitations Info  Sight, Hearing, Speech Sight Info: Adequate Hearing Info: Adequate Speech Info: Adequate    SPECIAL CARE FACTORS FREQUENCY  PT (By licensed PT), OT (By licensed OT)     PT Frequency: 5x/week OT Frequency: 3x/week            Contractures Contractures Info: Not present    Additional Factors Info  Code Status, Allergies, Isolation Precautions, Insulin Sliding Scale Code Status Info: Full Allergies Info: Crestor Rosuvastatin, Lipitor Atorvastatin, Shrimp Shellfish Allergy   Insulin Sliding Scale Info: 3x daily before meals and bedtime Isolation Precautions Info: ESBL     Current Medications (11/26/2018):  This is the current hospital active medication list Current Facility-Administered Medications  Medication Dose Route Frequency Provider Last Rate Last Dose  . 0.9 %  sodium chloride infusion  250 mL Intravenous Continuous Jerline Pain, MD   Stopped at 11/21/18 [phone]  . 0.9 %  sodium chloride infusion  Intra-arterial PRN Jerline Pain, MD      . acetaminophen (TYLENOL) tablet 650 mg  650 mg Oral Q4H PRN Jerline Pain, MD   650 mg at 11/25/18 1115  . amiodarone (PACERONE) tablet 100 mg  100 mg Oral Daily Jerline Pain, MD   100 mg at 11/26/18 1418  . aspirin chewable tablet 81 mg  81 mg Oral Daily Jerline Pain, MD   81 mg at  11/26/18 1225  . bisacodyl (DULCOLAX) suppository 10 mg  10 mg Rectal Daily PRN Jerline Pain, MD      . chlorhexidine gluconate (MEDLINE KIT) (PERIDEX) 0.12 % solution 15 mL  15 mL Mouth Rinse BID Jerline Pain, MD   15 mL at 11/26/18 1230  . clopidogrel (PLAVIX) tablet 75 mg  75 mg Oral Q breakfast Jerline Pain, MD   75 mg at 11/26/18 1224  . feeding supplement (ENSURE ENLIVE) (ENSURE ENLIVE) liquid 237 mL  237 mL Oral BID BM Skains, Mark C, MD      . heparin injection 5,000 Units  5,000 Units Subcutaneous Q8H Jerline Pain, MD   5,000 Units at 11/26/18 1418  . insulin aspart (novoLOG) injection 0-9 Units  0-9 Units Subcutaneous TID AC & HS Jerline Pain, MD   2 Units at 11/25/18 2329  . Melatonin TABS 6 mg  6 mg Oral QHS PRN Jerline Pain, MD      . piperacillin-tazobactam (ZOSYN) IVPB 3.375 g  3.375 g Intravenous Q8H Candee Furbish C, MD 12.5 mL/hr at 11/26/18 0520 3.375 g at 11/26/18 0520  . polyethylene glycol (MIRALAX / GLYCOLAX) packet 17 g  17 g Oral Daily Jerline Pain, MD   17 g at 11/26/18 1227  . potassium chloride 10 mEq in 100 mL IVPB  10 mEq Intravenous Q1 Hr x 2 Ghimire, Henreitta Leber, MD 100 mL/hr at 11/26/18 1237 10 mEq at 11/26/18 1237  . RESOURCE THICKENUP CLEAR   Oral PRN Jerline Pain, MD      . senna (SENOKOT) tablet 17.2 mg  2 tablet Oral QHS PRN Candee Furbish C, MD      . sodium chloride flush (NS) 0.9 % injection 10-40 mL  10-40 mL Intracatheter Q12H Jerline Pain, MD   10 mL at 11/24/18 2224  . sodium chloride flush (NS) 0.9 % injection 10-40 mL  10-40 mL Intracatheter PRN Candee Furbish C, MD      . sodium chloride flush (NS) 0.9 % injection 5 mL  5 mL Intracatheter Q8H Jerline Pain, MD   5 mL at 11/26/18 1411     Discharge Medications: Please see discharge summary for a list of discharge medications.  Relevant Imaging Results:  Relevant Lab Results:   Additional Information SSN: [id number]   Biliary tube. Needs 4 weeks [place],  LCSW

## 2018-11-26 NOTE — Discharge Instructions (Signed)
For drain issues, questions are concerns regarding your drain, please contract [facility].

## 2018-11-27 DIAGNOSIS — K810 Acute cholecystitis: Secondary | ICD-10-CM

## 2018-11-27 LAB — AEROBIC/ANAEROBIC CULTURE W GRAM STAIN (SURGICAL/DEEP WOUND)

## 2018-11-27 LAB — MAGNESIUM: Magnesium: 2 mg/dL (ref 1.7–2.4)

## 2018-11-27 LAB — GLUCOSE, CAPILLARY
Glucose-Capillary: 103 mg/dL — ABNORMAL HIGH (ref 70–99)
Glucose-Capillary: 121 mg/dL — ABNORMAL HIGH (ref 70–99)
Glucose-Capillary: 133 mg/dL — ABNORMAL HIGH (ref 70–99)
Glucose-Capillary: 169 mg/dL — ABNORMAL HIGH (ref 70–99)

## 2018-11-27 LAB — PHOSPHORUS: Phosphorus: 3.3 mg/dL (ref 2.5–4.6)

## 2018-11-27 LAB — AEROBIC/ANAEROBIC CULTURE (SURGICAL/DEEP WOUND)

## 2018-11-27 MED ORDER — SODIUM CHLORIDE 0.9% FLUSH: 10.0000 mL | INTRAVENOUS | Status: DC | PRN

## 2018-11-27 MED ORDER — ENSURE ENLIVE PO LIQD: 237.0000 mL | Freq: Two times a day (BID) | ORAL | 12 refills | Status: DC

## 2018-11-27 MED ORDER — BISACODYL 10 MG RE SUPP: 10.0000 mg | Freq: Every day | RECTAL | 0 refills | Status: DC | PRN

## 2018-11-27 MED ORDER — PIPERACILLIN-TAZOBACTAM IV (FOR PTA / DISCHARGE USE ONLY): 3.3750 g | Freq: Three times a day (TID) | INTRAVENOUS | 0 refills | Status: AC

## 2018-11-27 NOTE — Progress Notes (Signed)
PT Cancellation Note  Patient Details Name: William [name] MRN: [id number] DOB: [birth year]   Cancelled Treatment:    Reason Eval/Treat Not Completed: Patient at procedure or test/unavailable. Chart reviewed, stopped by room for PT treatment, however, pt having PICC placement. Pt's wife is in hallway, reports pt is to be DC back to [facility] s/p PICC placement. Will attempt treatment again next date if patient is still admitted.   3:38 PM, 11/27/18 Etta Grandchild, PT, DPT Physical Therapist - [place] [phone] (Pager)  [phone] (Office)     .  Nafeesa Dils C 11/27/2018, 3:36 PM

## 2018-11-27 NOTE — Progress Notes (Signed)
Per [facility], patient will require a PICC line. MD aware.   Percell Locus Lennan Malone LCSW [phone]

## 2018-11-27 NOTE — Discharge Summary (Signed)
PATIENT DETAILS Name: William [name] Age: 85 y.o. Sex: male Date of Birth: 03/11/1928 MRN: [id number]. Admitting Physician: Rigoberto Noel, MD [id number], Fransico Him, MD  Admit Date: 11/21/2018 Discharge date: 11/27/2018  Recommendations for Outpatient Follow-up:  1. Follow up with PCP in 1-2 weeks 2. Please obtain BMP/CBC in one week 3. Remove PICC line once patient completes a course of antimicrobial therapy 4. Repeat blood cultures 5 to 7 days after patient completes a course of antimicrobial therapy 5. Ensure follow-up with infectious disease and cardiology 6. Would benefit from daily, if not twice daily physical therapy at SNF  Admitted From:  Home  Disposition: SNF   Home Health: No  Equipment/Devices: None  Discharge Condition: Stable  CODE STATUS: FULL CODE  Diet recommendation:  Heart Healthy   Brief Summary: See H&P, Labs, Consult and Test reports for all details in brief, Patient is a 85 y.o. 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  Managed in the ICU, extubated on 2/10-stabilized and transferred to the triad hospitalist service on 2/12.  See below for further details.  Significant events: 2/8/: admitted to the hospital for respiratory failure 2/9: Percutaneous drain placed 2/10: Successfully extubated, changed to daptomycin 2/12: Transferred to the triad hospitalist service  Brief Hospital Course: Severe sepsis secondary to acute cholecystitis and Enterobacter bacteremia: Sepsis physiology has resolved, s/p percutaneous cholecystostomy drain on 2/9.  Blood cultures positive for  Enterobacter cloacae, bile cultures positive for enterococcus and Enterobacter.  TEE negative for vegetations on the aortic valve.  Recommendations from infectious disease to continue Zosyn until 2/22.   See below-please do weekly lab work as ordered.  Acute metabolic encephalopathy: Resolved-he is back to baseline.  Secondary to above-suspect may have some cognitive dysfunction/dementia at baseline.   Acute hypoxic respiratory failure: Extubated on 2/10-titrate off oxygen to room air.  History of ventricular tachycardia had AICD in place but this was explanted in December 2019 for ESBL producing Klebsiella lead endocarditis:  Monitored in telemetry-continue amiodarone and amiodarone  History of severe aortic stenosis s/p TAVR in November 2019: TTE on 2/10 with difficult to visualize AV prosthesis-but TEE without any suggestion of vegetations on the aortic valve.  Continue Plavix.    Chronic systolic heart failure (EF 25-30% by TTE on 11/23/2018): Volume status appears relatively stable-follow.  Severe deconditioning/debility: Secondary to acute illness-and numerous recent hospitalizations-recommendations are for SNF.  Will benefit from continued PT rehab daily or twice daily (if patient can tolerate) while at SNF.  AAA (4.6 x 4 cm infrarenal): Stable for outpatient follow-up.  Multiple small pulmonary nodules throughout the lung seen on CT chest on 08/03/2018: Stable for outpatient follow-up  Procedures/Studies: 2/13>> TEE  Discharge Diagnoses:  Principal Problem:   Cholecystitis Active Problems:   Acute respiratory failure ([place])   Sepsis ([place])   Shock ([place])   Pressure injury of skin   Bacteremia due to Enterobacter species   Enterococcus faecalis infection   Acute cholecystitis   Discharge Instructions:  Activity:  As tolerated   Discharge Instructions    (HEART FAILURE PATIENTS) Call MD:  Anytime you have any of the following symptoms: 1) 3 pound weight gain in 24  hours or 5 pounds in 1 week  2) shortness of breath, with or without a dry hacking cough 3) swelling in the hands, feet or stomach 4) if you have to sleep on extra pillows at night in order to breathe.   Complete by:  As directed    Call MD for:  persistant nausea and vomiting   Complete by:  As directed    Call MD for:  redness, tenderness, or signs of infection (pain, swelling, redness, odor or green/yellow discharge around incision site)   Complete by:  As directed    Diet - low sodium heart healthy   Complete by:  As directed    Discharge instructions   Complete by:  As directed    Follow with Primary MD  Tisovec, Fransico Him, MD in 1 week  Follow-up with ID, IR drain clinic and cardiology in the next 1-2 weeks.  Please get a complete blood count and chemistry panel checked by your Primary MD at your next visit, and again as instructed by your Primary MD.  Get Medicines reviewed and adjusted: Please take all your medications with you for your next visit with your Primary MD  Laboratory/radiological data: Please request your Primary MD to go over all hospital tests and procedure/radiological results at the follow up, please ask your Primary MD to get all Hospital records sent to his/her office.  In some cases, they will be blood work, cultures and biopsy results pending at the time of your discharge. Please request that your primary care M.D. follows up on these results.  Also Note the following: If you experience worsening of your admission symptoms, develop shortness of breath, life threatening emergency, suicidal or homicidal thoughts you must seek medical attention immediately by calling 911 or calling your MD immediately  if symptoms less severe.  You must read complete instructions/literature along with all the possible adverse reactions/side effects for all the Medicines you take and that have been prescribed to you. Take any new Medicines after you have completely understood and  accpet all the possible adverse reactions/side effects.   Do not drive when taking Pain medications or sleeping medications (Benzodaizepines)  Do not take more than prescribed Pain, Sleep and Anxiety Medications. It is not advisable to combine anxiety,sleep and pain medications without talking with your primary care practitioner  Special Instructions: If you have smoked or chewed Tobacco  in the last 2 yrs please stop smoking, stop any regular Alcohol  and or any Recreational drug use.  Wear Seat belts while driving.  Please note: You were cared for by a hospitalist during your hospital stay. Once you are discharged, your primary care physician will handle any further medical issues. Please note that NO REFILLS for any discharge medications will be authorized once you are discharged, as it is imperative that you return to your primary care physician (or establish a relationship with a primary care physician if you do not have one) for your post hospital discharge needs so that they can reassess your need for medications and monitor your lab values.   Home infusion instructions [facility] May follow [place] Dosing Protocol; May administer Cathflo as needed to maintain patency of vascular access device.; Flushing of vascular access device: per [facility] Protocol: 0.9% NaCl pre/post medica...   Complete by:  As directed    Instructions:  May follow [place] Dosing Protocol   Instructions:  May administer Cathflo as needed to maintain patency of vascular access device.   Instructions:  Flushing of vascular access device: per  [facility] Protocol: 0.9% NaCl pre/post medication administration and prn patency; Heparin 100 u/ml, 65m for implanted ports and Heparin 10u/ml, 548mfor all other central venous catheters.   Instructions:  May follow [facility] Anaphylaxis Protocol for First Dose Administration in the home: 0.9% NaCl at 25-50 ml/hr to maintain IV access for protocol meds. Epinephrine 0.3 ml IV/IM PRN  and Benadryl 25-50 IV/IM PRN s/s of anaphylaxis.   Instructions:  [facility]fusion Coordinator (RN) to assist per patient IV care needs in the home PRN.   Increase activity slowly   Complete by:  As directed      Allergies as of 11/27/2018      Reactions   Crestor [rosuvastatin] Other (See Comments)   Hurts muscles   Lipitor [atorvastatin] Other (See Comments)   Hurts stomach   Shrimp [shellfish Allergy] Other (See Comments)   On MAR      Medication List    STOP taking these medications   ezetimibe-simvastatin 10-20 MG tablet Commonly known as:  VYTORIN     TAKE these medications   acetaminophen 500 MG tablet Commonly known as:  TYLENOL Take 500 mg by mouth every 6 (six) hours as needed (for pain).   amiodarone 200 MG tablet Commonly known as:  PACERONE Take 0.5 tablets (100 mg total) by mouth daily.   aspirin 81 MG chewable tablet Chew 1 tablet (81 mg total) by mouth daily.   bisacodyl 10 MG suppository Commonly known as:  DULCOLAX Place 1 suppository (10 mg total) rectally daily as needed for moderate constipation.   cholecalciferol 1000 units tablet Commonly known as:  VITAMIN D Take 2,000 Units by mouth at bedtime.   clopidogrel 75 MG tablet Commonly known as:  PLAVIX Take 1 tablet (75 mg total) by mouth daily with breakfast.   feeding supplement (ENSURE ENLIVE) Liqd Take 237 mLs by mouth 2 (two) times daily between meals.   feeding supplement (PRO-STAT SUGAR FREE 64) Liqd Take 30 mLs by mouth daily.   metoprolol succinate 25 MG 24 hr tablet Commonly known as:  TOPROL-XL Take 1 tablet (25 mg total) by mouth daily. Take with or immediately following a meal.   piperacillin-tazobactam  IVPB Commonly known as:  ZOSYN Inject 3.375 g into the vein every 8 (eight) hours for 9 days. Indication:  Bacteremia Last Day of Therapy:  12/05/2018 Labs - Once weekly:  CBC/D and BMP, Labs - Every other week:  ESR and CRP   polyethylene glycol  packet Commonly known as:  MIRALAX / GLYCOLAX Take 17 g by mouth every evening. What changed:    how much to take  additional instructions   PRESERVISION AREDS 2 PO Take 1 capsule by mouth daily.   triamcinolone 0.025 % cream Commonly known as:  KENALOG Apply 1 application topically 2 (two) times daily. Until resolved            Home Infusion Instuctions  (From admission, onward)         Start     Ordered   11/27/18 0000  Home infusion instructions [facility] May follow [facility]osing Protocol; May administer Cathflo as needed to maintain patency of vascular access device.; Flushing of vascular access device: per [facility]: 0.9% NaCl pre/post medica...    Question Answer Comment  Instructions May follow [facility]osing Protocol   Instructions May administer Cathflo as needed to maintain patency of vascular access device.   Instructions Flushing of vascular access device: [facility]: 0.9% NaCl  pre/post medication administration and prn patency; Heparin 100 u/ml, 65m for implanted ports and Heparin 10u/ml, 568mfor all other central venous catheters.   Instructions May follow [facility] Anaphylaxis Protocol for First Dose Administration in the home: 0.9% NaCl at 25-50 ml/hr to maintain IV access for protocol meds. Epinephrine 0.3 ml IV/IM PRN and Benadryl 25-50 IV/IM PRN s/s of anaphylaxis.   Instructions [facility] Infusion Coordinator (RN) to assist per patient IV care needs in the home PRN.      11/27/18 0843         Follow-up Information    WiGreer PickerelMD Follow up on 01/06/2019.   Specialty:  General Surgery Why:  8:45am, arrive by 8:15am for paperwork and check in Contact information: [address] [place] [phone]        Tisovec, RiFransico HimMD. Schedule an appointment as soon as possible for a visit in 1 week(s).   Specialty:  Internal Medicine Contact information: [phone]        CrSanda KleinMD .   Specialty:  Cardiology Contact information: [address].   Why:  IR scheduler will call you with appointment date and time (6-8 weeks from placement of cholecystostomy). Please call with any questions or concerns.  Contact information: [phone]        Allergies  Allergen Reactions  . Crestor [Rosuvastatin] Other (See Comments)    Hurts muscles  . Lipitor [Atorvastatin] Other (See Comments)    Hurts stomach  . Shrimp [Shellfish Allergy] Other (See Comments)    On MAR    Consultations:   cardiology, ID and IR   Other Procedures/Studies: Ct Abdomen Pelvis Wo Contrast  Result Date: 11/21/2018 CLINICAL DATA:  Sepsis. EXAM: CT ABDOMEN AND PELVIS WITHOUT CONTRAST TECHNIQUE: Multidetector CT imaging of the abdomen and pelvis was performed following the standard protocol without IV contrast. COMPARISON:  CT scan 09/04/2018 FINDINGS: Lower chest: The heart is upper limits normal in size for age. Coronary artery calcifications and evidence of prior TAVR. Small bilateral pleural effusions and bibasilar atelectasis or infiltrates. Hepatobiliary: No focal hepatic lesions are identified without contrast. Numerous gallstones are noted in the gallbladder. Suspect mild gallbladder wall thickening and mild pericholecystic inflammatory changes. Could not exclude acute cholecystitis. No common bile duct dilatation. Pancreas: Moderate atrophy but no mass or acute inflammation. Spleen: Normal size.  No focal lesions. Adrenals/Urinary Tract: The adrenal glands are unremarkable and stable. Bilateral renal cysts but no worrisome renal masses. No renal or obstructing ureteral calculi. There is a Foley catheter in a decompressed bladder. No bladder mass or bladder calculi. Stomach/Bowel: The stomach  contains an NG tube. The duodenum, small bowel and colon are grossly normal. No acute inflammatory process, mass lesion or obstructive findings. Evidence of a prior right hemicolectomy. Sigmoid colon diverticulosis without findings for acute diverticulitis. Small amount of fluid noted in the right pericolic gutter and in the anterior pararenal space. Vascular/Lymphatic: Advanced atherosclerotic calcifications involving the aorta. 4.6 x 4.0 cm infrarenal abdominal aortic aneurysm with a left-sided saccular component. This measured a maximum of 4.5 cm on the prior study. Reproductive: The prostate gland and seminal vesicles are grossly normal. Other: Small amount of free fluid in the pelvis but no pelvic mass or adenopathy. No inguinal mass  or adenopathy. Musculoskeletal: No significant bony findings. Stable compression fracture of L1 with a vertebral plana appearance. IMPRESSION: 1. Cholelithiasis with findings suspicious for acute cholecystitis. Recommend correlation with right upper quadrant ultrasound examination. 2. Stable infrarenal abdominal aortic aneurysm with a left-sided saccular component. 3. Small bilateral pleural effusions and bibasilar atelectasis or infiltrates. Electronically Signed   By: Marijo Sanes M.D.   On: 11/21/2018 08:57   US Abdomen Complete  Result Date: 11/21/2018 CLINICAL DATA:  Acute cholecystitis an urinary tract infection. Patient is intubated. Elevated total bilirubin. EXAM: ABDOMEN ULTRASOUND COMPLETE COMPARISON:  CT on 11/21/2018 a prior CT exams including 12/07/2013 FINDINGS: Gallbladder: Gallbladder wall is irregular and thickened, measuring 7 millimeters. Dependent stones are present, measuring on the order of 1.7 centimeters or smaller. Common bile duct: Diameter: 5.4 millimeter Liver: The visualized portion of the liver is unremarkable. LEFT hepatic lobe is not well seen because of bowel gas. Portal vein is patent on color Doppler imaging with normal direction of blood flow  towards the liver. IVC: Visualized portion normal. Pancreas: Obscured by bowel gas. Spleen: Obscured by bowel gas. Right Kidney: Length: 11.0 centimeters. 4.6 x 4.5 x 5.2 centimeter cyst containing internal septation. On CT exam 2015, this lesion measured 3.2 x 3.2 centimeters. No hydronephrosis. Left Kidney: Length: 12.3 centimeters. Simple cyst is 3.5 x 2.4 x 2.9 centimeters. No hydronephrosis. Abdominal aorta: Not visualized on the level of the mid abdominal aorta to the bifurcation segment 1 bowel gas. No visualized aneurysm. Other findings: Study quality is degraded by patient body habitus and intubation. Note is made of a small RIGHT pleural effusion. IMPRESSION: 1. Irregular, thickened gallbladder wall and multiple gallstones. Presence or absence of sonographic Murphy's can not be assessed due to the patient's intubated status. Findings are suspicious for acute cholecystitis however. 2. RIGHT pleural effusion. 3. Complex cystic lesion within the UPPER pole the RIGHT kidney. Lesion has progressed since prior studies. If possible and needed, consider further characterization with outpatient contrast-enhanced CT or MRI with intravenous contrast. Electronically Signed   By: Nolon Nations M.D.   On: 11/21/2018 12:46   Ir Perc Cholecystostomy  Result Date: 11/23/2018 INDICATION: 85 year old male with a history of cholecystitis EXAM: CHOLECYSTOSTOMY MEDICATIONS: In-hospital antibiotics ANESTHESIA/SEDATION: Moderate (conscious) sedation was employed during this procedure. A total of Versed 2.0 mg and Fentanyl 0 mcg was administered intravenously. Moderate Sedation Time: 0 minutes. The patient's level of consciousness and vital signs were monitored continuously by radiology nursing throughout the procedure under my direct supervision. FLUOROSCOPY TIME:  Fluoroscopy Time: 0 minutes 24 seconds (42.6 mGy). COMPLICATIONS: None PROCEDURE: Informed written consent was obtained from the patient and the patient's  family after a thorough discussion of the procedural risks, benefits and alternatives. All questions were addressed. Maximal Sterile Barrier Technique was utilized including caps, mask, sterile gowns, sterile gloves, sterile drape, hand hygiene and skin antiseptic. A timeout was performed prior to the initiation of the procedure. Ultrasound survey of the right upper quadrant was performed for planning purposes. Once the patient is prepped and draped in the usual sterile fashion, the skin and subcutaneous tissues overlying the gallbladder were generously infiltrated 1% lidocaine for local anesthesia. A coaxial needle was advanced under ultrasound guidance through the skin subcutaneous tissues and a small segment of liver into the gallbladder lumen. With removal of the stylet, spontaneous dark bile drainage occurred. Using modified Seldinger technique, a 10 French drain was placed into the gallbladder fossa, with aspiration of the sample for the lab. Contrast injection  confirmed position of the tube within the gallbladder lumen. Drainage catheter was attached to gravity drain with a suture retention placed. Patient tolerated the procedure well and remained hemodynamically stable throughout. No complications were encountered and no significant blood loss encountered. IMPRESSION: Status post image guided percutaneous cholecystostomy. Signed, Dulcy Fanny. Dellia Nims, RPVI [facility] Radiology Electronically Signed   By: Corrie Mckusick D.O.   On: 11/23/2018 08:16   Dg Chest Port 1 View  Result Date: 11/23/2018 CLINICAL DATA:  Acute respiratory failure EXAM: PORTABLE CHEST 1 VIEW COMPARISON:  Two days ago FINDINGS: Endotracheal tube tip between the clavicular heads and carina. The orogastric tube reaches the stomach. Low volume chest with streaky density at the bases. There may be trace pleural fluid. No Kerley lines or pneumothorax. Cardiomegaly. Transcatheter aortic valve  replacement IMPRESSION: Stable hardware positioning, low lung volumes, and presumed atelectasis. Electronically Signed   By: Monte Fantasia M.D.   On: 11/23/2018 06:53   Dg Chest Portable 1 View  Result Date: 11/21/2018 CLINICAL DATA:  Intubation. EXAM: PORTABLE CHEST 1 VIEW COMPARISON:  Earlier film, same date. FINDINGS: The endotracheal tube is right at the carina and should be retracted approximately 3 cm. The NG tube is coursing down the esophagus and into the stomach. Stable cardiac enlargement. Bibasilar atelectasis but no definite infiltrates or effusions. IMPRESSION: The endotracheal tube is right at the carina and should be retracted at least 3 cm. Bibasilar atelectasis but no edema or infiltrates Electronically Signed   By: Marijo Sanes M.D.   On: 11/21/2018 07:40   Dg Chest Port 1 View  Result Date: 11/21/2018 CLINICAL DATA:  Sepsis. EXAM: PORTABLE CHEST 1 VIEW COMPARISON:  09/16/2018 FINDINGS: The heart is enlarged but stable. Marked tortuosity and calcification of the thoracic aorta. Bibasilar atelectasis. No definite pleural effusions. No obvious infiltrates. IMPRESSION: Cardiac enlargement and marked tortuosity and calcification of the thoracic aorta. No definite infiltrates, effusions or edema. Streaky basilar atelectasis. Electronically Signed   By: Marijo Sanes M.D.   On: 11/21/2018 06:21   [place] Ekg Site Rite  Result Date: 11/26/2018 If Site Rite image not attached, placement could not be confirmed due to current cardiac rhythm.    TODAY-DAY OF DISCHARGE:  Subjective:   William [name] today has no headache,no chest abdominal pain,no new weakness tingling or numbness, feels much better wants to go home today.   Objective:   Blood pressure 105/74, pulse 86, temperature 97.9 F (36.6 C), temperature source Oral, resp. rate 18, height 5' 10" (1.778 m), weight 98.7 kg, SpO2 97 %.  Intake/Output Summary (Last 24 hours) at 11/27/2018 1036 Last data filed at 11/27/2018  0509 Gross per 24 hour  Intake 255 ml  Output 80 ml  Net 175 ml   Filed Weights   11/22/18 0500 11/23/18 0340 11/27/18 0131  Weight: 95.3 kg 102.4 kg 98.7 kg    Exam: Awake Alert, Oriented *3, No new F.N deficits, Normal affect [place].AT,PERRAL Supple Neck,No JVD, No cervical lymphadenopathy appriciated.  Symmetrical Chest wall movement, Good air movement bilaterally, CTAB RRR,No Gallops,Rubs or new Murmurs, No Parasternal Heave +ve B.Sounds, Abd Soft, Non tender, No organomegaly appriciated, No rebound -guarding or rigidity. No Cyanosis, Clubbing or edema, No new Rash or bruise   PERTINENT RADIOLOGIC STUDIES: Ct Abdomen Pelvis Wo Contrast  Result Date: 11/21/2018 CLINICAL DATA:  Sepsis. EXAM: CT ABDOMEN AND PELVIS WITHOUT CONTRAST TECHNIQUE: Multidetector CT imaging of the abdomen and pelvis was performed following the standard protocol without  IV contrast. COMPARISON:  CT scan 09/04/2018 FINDINGS: Lower chest: The heart is upper limits normal in size for age. Coronary artery calcifications and evidence of prior TAVR. Small bilateral pleural effusions and bibasilar atelectasis or infiltrates. Hepatobiliary: No focal hepatic lesions are identified without contrast. Numerous gallstones are noted in the gallbladder. Suspect mild gallbladder wall thickening and mild pericholecystic inflammatory changes. Could not exclude acute cholecystitis. No common bile duct dilatation. Pancreas: Moderate atrophy but no mass or acute inflammation. Spleen: Normal size.  No focal lesions. Adrenals/Urinary Tract: The adrenal glands are unremarkable and stable. Bilateral renal cysts but no worrisome renal masses. No renal or obstructing ureteral calculi. There is a Foley catheter in a decompressed bladder. No bladder mass or bladder calculi. Stomach/Bowel: The stomach contains an NG tube. The duodenum, small bowel and colon are grossly normal. No acute inflammatory process, mass lesion or obstructive findings.  Evidence of a prior right hemicolectomy. Sigmoid colon diverticulosis without findings for acute diverticulitis. Small amount of fluid noted in the right pericolic gutter and in the anterior pararenal space. Vascular/Lymphatic: Advanced atherosclerotic calcifications involving the aorta. 4.6 x 4.0 cm infrarenal abdominal aortic aneurysm with a left-sided saccular component. This measured a maximum of 4.5 cm on the prior study. Reproductive: The prostate gland and seminal vesicles are grossly normal. Other: Small amount of free fluid in the pelvis but no pelvic mass or adenopathy. No inguinal mass or adenopathy. Musculoskeletal: No significant bony findings. Stable compression fracture of L1 with a vertebral plana appearance. IMPRESSION: 1. Cholelithiasis with findings suspicious for acute cholecystitis. Recommend correlation with right upper quadrant ultrasound examination. 2. Stable infrarenal abdominal aortic aneurysm with a left-sided saccular component. 3. Small bilateral pleural effusions and bibasilar atelectasis or infiltrates. Electronically Signed   By: Marijo Sanes M.D.   On: 11/21/2018 08:57   US Abdomen Complete  Result Date: 11/21/2018 CLINICAL DATA:  Acute cholecystitis an urinary tract infection. Patient is intubated. Elevated total bilirubin. EXAM: ABDOMEN ULTRASOUND COMPLETE COMPARISON:  CT on 11/21/2018 a prior CT exams including 12/07/2013 FINDINGS: Gallbladder: Gallbladder wall is irregular and thickened, measuring 7 millimeters. Dependent stones are present, measuring on the order of 1.7 centimeters or smaller. Common bile duct: Diameter: 5.4 millimeter Liver: The visualized portion of the liver is unremarkable. LEFT hepatic lobe is not well seen because of bowel gas. Portal vein is patent on color Doppler imaging with normal direction of blood flow towards the liver. IVC: Visualized portion normal. Pancreas: Obscured by bowel gas. Spleen: Obscured by bowel gas. Right Kidney: Length: 11.0  centimeters. 4.6 x 4.5 x 5.2 centimeter cyst containing internal septation. On CT exam 2015, this lesion measured 3.2 x 3.2 centimeters. No hydronephrosis. Left Kidney: Length: 12.3 centimeters. Simple cyst is 3.5 x 2.4 x 2.9 centimeters. No hydronephrosis. Abdominal aorta: Not visualized on the level of the mid abdominal aorta to the bifurcation segment 1 bowel gas. No visualized aneurysm. Other findings: Study quality is degraded by patient body habitus and intubation. Note is made of a small RIGHT pleural effusion. IMPRESSION: 1. Irregular, thickened gallbladder wall and multiple gallstones. Presence or absence of sonographic Murphy's can not be assessed due to the patient's intubated status. Findings are suspicious for acute cholecystitis however. 2. RIGHT pleural effusion. 3. Complex cystic lesion within the UPPER pole the RIGHT kidney. Lesion has progressed since prior studies. If possible and needed, consider further characterization with outpatient contrast-enhanced CT or MRI with intravenous contrast. Electronically Signed   By: Nolon Nations M.D.   On:  11/21/2018 12:46   Ir Perc Cholecystostomy  Result Date: 11/23/2018 INDICATION: 85 year old male with a history of cholecystitis EXAM: CHOLECYSTOSTOMY MEDICATIONS: In-hospital antibiotics ANESTHESIA/SEDATION: Moderate (conscious) sedation was employed during this procedure. A total of Versed 2.0 mg and Fentanyl 0 mcg was administered intravenously. Moderate Sedation Time: 0 minutes. The patient's level of consciousness and vital signs were monitored continuously by radiology nursing throughout the procedure under my direct supervision. FLUOROSCOPY TIME:  Fluoroscopy Time: 0 minutes 24 seconds (42.6 mGy). COMPLICATIONS: None PROCEDURE: Informed written consent was obtained from the patient and the patient's family after a thorough discussion of the procedural risks, benefits and alternatives. All questions were addressed. Maximal Sterile Barrier  Technique was utilized including caps, mask, sterile gowns, sterile gloves, sterile drape, hand hygiene and skin antiseptic. A timeout was performed prior to the initiation of the procedure. Ultrasound survey of the right upper quadrant was performed for planning purposes. Once the patient is prepped and draped in the usual sterile fashion, the skin and subcutaneous tissues overlying the gallbladder were generously infiltrated 1% lidocaine for local anesthesia. A coaxial needle was advanced under ultrasound guidance through the skin subcutaneous tissues and a small segment of liver into the gallbladder lumen. With removal of the stylet, spontaneous dark bile drainage occurred. Using modified Seldinger technique, a 10 French drain was placed into the gallbladder fossa, with aspiration of the sample for the lab. Contrast injection confirmed position of the tube within the gallbladder lumen. Drainage catheter was attached to gravity drain with a suture retention placed. Patient tolerated the procedure well and remained hemodynamically stable throughout. No complications were encountered and no significant blood loss encountered. IMPRESSION: Status post image guided percutaneous cholecystostomy. Signed, Dulcy Fanny. Dellia Nims, RPVI [facility] Radiology Electronically Signed   By: Corrie Mckusick D.O.   On: 11/23/2018 08:16   Dg Chest Port 1 View  Result Date: 11/23/2018 CLINICAL DATA:  Acute respiratory failure EXAM: PORTABLE CHEST 1 VIEW COMPARISON:  Two days ago FINDINGS: Endotracheal tube tip between the clavicular heads and carina. The orogastric tube reaches the stomach. Low volume chest with streaky density at the bases. There may be trace pleural fluid. No Kerley lines or pneumothorax. Cardiomegaly. Transcatheter aortic valve replacement IMPRESSION: Stable hardware positioning, low lung volumes, and presumed atelectasis. Electronically Signed   By: Monte Fantasia  M.D.   On: 11/23/2018 06:53   Dg Chest Portable 1 View  Result Date: 11/21/2018 CLINICAL DATA:  Intubation. EXAM: PORTABLE CHEST 1 VIEW COMPARISON:  Earlier film, same date. FINDINGS: The endotracheal tube is right at the carina and should be retracted approximately 3 cm. The NG tube is coursing down the esophagus and into the stomach. Stable cardiac enlargement. Bibasilar atelectasis but no definite infiltrates or effusions. IMPRESSION: The endotracheal tube is right at the carina and should be retracted at least 3 cm. Bibasilar atelectasis but no edema or infiltrates Electronically Signed   By: Marijo Sanes M.D.   On: 11/21/2018 07:40   Dg Chest Port 1 View  Result Date: 11/21/2018 CLINICAL DATA:  Sepsis. EXAM: PORTABLE CHEST 1 VIEW COMPARISON:  09/16/2018 FINDINGS: The heart is enlarged but stable. Marked tortuosity and calcification of the thoracic aorta. Bibasilar atelectasis. No definite pleural effusions. No obvious infiltrates. IMPRESSION: Cardiac enlargement and marked tortuosity and calcification of the thoracic aorta. No definite infiltrates, effusions or edema. Streaky basilar atelectasis. Electronically Signed   By: Marijo Sanes M.D.   On: 11/21/2018 06:21   [place] Ekg Site  Rite  Result Date: 11/26/2018 If Site Rite image not attached, placement could not be confirmed due to current cardiac rhythm.    PERTINENT LAB RESULTS: CBC: Recent Labs    11/25/18 0357 11/26/18 0430  WBC 5.7 5.8  HGB 10.2* 9.9*  HCT 32.9* 30.4*  PLT 123* 137*   CMET CMP     Component Value Date/Time   NA 138 11/26/2018 0430   NA 137 10/05/2018 0500   K 3.1 (L) 11/26/2018 0430   CL 109 11/26/2018 0430   CO2 24 11/26/2018 0430   GLUCOSE 133 (H) 11/26/2018 0430   BUN 12 11/26/2018 0430   BUN 18 10/05/2018 0500   CREATININE 0.81 11/26/2018 0430   CREATININE 1.12 (H) 02/05/2017 1103   CALCIUM 7.6 (L) 11/26/2018 0430   PROT 5.2 (L) 11/25/2018 0357   ALBUMIN 1.7 (L) 11/25/2018 0357   AST 65 (H)  11/25/2018 0357   ALT 58 (H) 11/25/2018 0357   ALKPHOS 86 11/25/2018 0357   BILITOT 1.2 11/25/2018 0357   GFRNONAA >60 11/26/2018 0430   GFRAA >60 11/26/2018 0430    GFR Estimated Creatinine Clearance: 72.8 mL/min (by C-G formula based on SCr of 0.81 mg/dL). No results for input(s): LIPASE, AMYLASE in the last 72 hours. No results for input(s): CKTOTAL, CKMB, CKMBINDEX, TROPONINI in the last 72 hours. Invalid input(s): POCBNP No results for input(s): DDIMER in the last 72 hours. No results for input(s): HGBA1C in the last 72 hours. No results for input(s): CHOL, HDL, LDLCALC, TRIG, CHOLHDL, LDLDIRECT in the last 72 hours. No results for input(s): TSH, T4TOTAL, T3FREE, THYROIDAB in the last 72 hours.  Invalid input(s): FREET3 No results for input(s): VITAMINB12, FOLATE, FERRITIN, TIBC, IRON, RETICCTPCT in the last 72 hours. Coags: No results for input(s): INR in the last 72 hours.  Invalid input(s): PT Microbiology: Recent Results (from the past 240 hour(s))  Culture, blood (Routine x 2)     Status: Abnormal   Collection Time: 11/21/18  5:21 AM  Result Value Ref Range Status   Specimen Description BLOOD RIGHT ANTECUBITAL  Final   Special Requests   Final    BOTTLES DRAWN AEROBIC AND ANAEROBIC Blood Culture adequate volume   Culture  Setup Time   Final    GRAM NEGATIVE RODS IN BOTH AEROBIC AND ANAEROBIC BOTTLES CRITICAL RESULT CALLED TO, READ BACK BY AND VERIFIED WITH: B MANCHERIL,PHARMD AT 2014 11/21/2018 BY L BENFIELD    Culture (A)  Final    ENTEROBACTER CLOACAE CORRECTED ON 02/10 AT 0920: PREVIOUSLY REPORTED AS ESCHERICHIA COLI CORRECTED RESULTS PREVIOUSLY REPORTED AS: ESCHERICHIA COLI CORRECTED RESULTS CALLED TO: RN Oliver Hum [phone] Performed at [facility], [place] [address], [place], [place] [address]    Report Status 11/23/2018 FINAL  Final   Organism ID, Bacteria ENTEROBACTER CLOACAE  Final      Susceptibility   Enterobacter cloacae - MIC*     AMPICILLIN 16 INTERMEDIATE Intermediate     CEFAZOLIN >=64 RESISTANT Resistant     CEFEPIME <=1 SENSITIVE Sensitive     CEFTAZIDIME <=1 SENSITIVE Sensitive     CEFTRIAXONE <=1 SENSITIVE Sensitive     CIPROFLOXACIN <=0.25 SENSITIVE Sensitive     GENTAMICIN <=1 SENSITIVE Sensitive     IMIPENEM 0.5 SENSITIVE Sensitive     TRIMETH/SULFA <=20 SENSITIVE Sensitive     AMPICILLIN/SULBACTAM 8 SENSITIVE Sensitive     PIP/TAZO <=4 SENSITIVE Sensitive     Extended ESBL NEGATIVE Sensitive     * ENTEROBACTER CLOACAE  CORRECTED ON 02/10 AT 0920: PREVIOUSLY REPORTED AS ESCHERICHIA COLI  Blood Culture ID Panel (Reflexed)     Status: Abnormal   Collection Time: 11/21/18  5:21 AM  Result Value Ref Range Status   Enterococcus species NOT DETECTED NOT DETECTED Final   Listeria monocytogenes NOT DETECTED NOT DETECTED Final   Staphylococcus species NOT DETECTED NOT DETECTED Final   Staphylococcus aureus (BCID) NOT DETECTED NOT DETECTED Final   Streptococcus species NOT DETECTED NOT DETECTED Final   Streptococcus agalactiae NOT DETECTED NOT DETECTED Final   Streptococcus pneumoniae NOT DETECTED NOT DETECTED Final   Streptococcus pyogenes NOT DETECTED NOT DETECTED Final   Acinetobacter baumannii NOT DETECTED NOT DETECTED Final   Enterobacteriaceae species DETECTED (A) NOT DETECTED Final    Comment: Enterobacteriaceae represent a large family of gram-negative bacteria, not a single organism. CRITICAL RESULT CALLED TO, READ BACK BY AND VERIFIED WITH: B MANCHERIL,PHARMD AT 2014 11/21/2018 BY L BENFIELD    Enterobacter cloacae complex DETECTED (A) NOT DETECTED Final    Comment: CRITICAL RESULT CALLED TO, READ BACK BY AND VERIFIED WITH: B MANCHERIL,PHARMD AT 2014 11/21/2018 BY L BENFIELD    Escherichia coli NOT DETECTED NOT DETECTED Final   Klebsiella oxytoca NOT DETECTED NOT DETECTED Final   Klebsiella pneumoniae NOT DETECTED NOT DETECTED Final   Proteus species NOT DETECTED NOT DETECTED Final   Serratia  marcescens NOT DETECTED NOT DETECTED Final   Carbapenem resistance NOT DETECTED NOT DETECTED Final   Haemophilus influenzae NOT DETECTED NOT DETECTED Final   Neisseria meningitidis NOT DETECTED NOT DETECTED Final   Pseudomonas aeruginosa NOT DETECTED NOT DETECTED Final   Candida albicans NOT DETECTED NOT DETECTED Final   Candida glabrata NOT DETECTED NOT DETECTED Final   Candida krusei NOT DETECTED NOT DETECTED Final   Candida parapsilosis NOT DETECTED NOT DETECTED Final   Candida tropicalis NOT DETECTED NOT DETECTED Final    Comment: Performed at [facility], [place]. [address] [place], [place] [address]  Culture, blood (Routine x 2)     Status: Abnormal   Collection Time: 11/21/18  5:22 AM  Result Value Ref Range Status   Specimen Description BLOOD BLOOD LEFT FOREARM  Final   Special Requests   Final    BOTTLES DRAWN AEROBIC AND ANAEROBIC Blood Culture adequate volume   Culture  Setup Time   Final    GRAM NEGATIVE RODS IN BOTH AEROBIC AND ANAEROBIC BOTTLES CRITICAL VALUE NOTED.  VALUE IS CONSISTENT WITH PREVIOUSLY REPORTED AND CALLED VALUE.    Culture (A)  Final    ENTEROBACTER CLOACAE SUSCEPTIBILITIES PERFORMED ON PREVIOUS CULTURE WITHIN THE LAST 5 DAYS. Performed at [facility], [place] [address] [place], [place] [address]    Report Status 11/23/2018 FINAL  Final  Culture, Urine     Status: Abnormal   Collection Time: 11/21/18  8:05 AM  Result Value Ref Range Status   Specimen Description URINE, CATHETERIZED  Final   Special Requests   Final    Normal Performed at [facility], [place] [address] [place], [place] [address]    Culture >=100,000 COLONIES/mL YEAST (A)  Final   Report Status 11/22/2018 FINAL  Final  Culture, blood (routine x 2)     Status: None   Collection Time: 11/21/18  9:00 AM  Result Value Ref Range Status   Specimen Description BLOOD RIGHT HAND  Final   Special Requests   Final    BOTTLES DRAWN AEROBIC ONLY Blood Culture results may not be  optimal due to an excessive volume of blood received in culture bottles   Culture   Final    NO GROWTH 5 DAYS Performed at [facility], [place] [address], [place], [place] [address]    Report Status 11/26/2018 FINAL  Final  Culture, blood (routine x 2)     Status: None   Collection Time: 11/21/18  9:10 AM  Result Value Ref Range Status   Specimen Description BLOOD RIGHT WRIST  Final   Special Requests   Final    BOTTLES DRAWN AEROBIC ONLY Blood Culture results may not be optimal due to an inadequate volume of blood received in culture bottles   Culture   Final    NO GROWTH 5 DAYS Performed at [facility], [place] [address], [place], [place] [address]    Report Status 11/26/2018 FINAL  Final  Respiratory Panel by PCR     Status: None   Collection Time: 11/21/18  9:18 AM  Result Value Ref Range Status   Adenovirus NOT DETECTED NOT DETECTED Final   Coronavirus 229E NOT DETECTED NOT DETECTED Final    Comment: (NOTE) The Coronavirus on the Respiratory Panel, DOES NOT test for the novel  Coronavirus (2019 nCoV)    Coronavirus HKU1 NOT DETECTED NOT DETECTED Final   Coronavirus NL63 NOT DETECTED NOT DETECTED Final   Coronavirus OC43 NOT DETECTED NOT DETECTED Final   Metapneumovirus NOT DETECTED NOT DETECTED Final   Rhinovirus / Enterovirus NOT DETECTED NOT DETECTED Final   Influenza A NOT DETECTED NOT DETECTED Final   Influenza B NOT DETECTED NOT DETECTED Final   Parainfluenza Virus 1 NOT DETECTED NOT DETECTED Final   Parainfluenza Virus 2 NOT DETECTED NOT DETECTED Final   Parainfluenza Virus 3 NOT DETECTED NOT DETECTED Final   Parainfluenza Virus 4 NOT DETECTED NOT DETECTED Final   Respiratory Syncytial Virus NOT DETECTED NOT DETECTED Final   Bordetella pertussis NOT DETECTED NOT DETECTED Final   Chlamydophila pneumoniae NOT DETECTED NOT DETECTED Final   Mycoplasma pneumoniae NOT DETECTED NOT DETECTED Final    Comment: Performed at [facility], [address]  [place], [place] [address]  MRSA PCR Screening     Status: None   Collection Time: 11/21/18  9:18 AM  Result Value Ref Range Status   MRSA by PCR NEGATIVE NEGATIVE Final    Comment:        The GeneXpert MRSA Assay (FDA approved for NASAL specimens only), is one component of a comprehensive MRSA colonization surveillance program. It is not intended to diagnose MRSA infection nor to guide or monitor treatment for MRSA infections. Performed at [facility], [place] [address], [place],  [address]   Culture, respiratory (non-expectorated)     Status: None   Collection Time: 11/21/18  4:42 PM  Result Value Ref Range Status   Specimen Description TRACHEAL ASPIRATE  Final   Special Requests Normal  Final   Gram Stain   Final    RARE WBC PRESENT, PREDOMINANTLY PMN RARE GRAM POSITIVE COCCI    Culture   Final    FEW Consistent with normal respiratory flora. Performed at [facility], [place] [address] [place],  [address]    Report Status 11/24/2018 FINAL  Final  Aerobic/Anaerobic Culture (surgical/deep wound)     Status: None (Preliminary result)   Collection Time: 11/22/18  2:56 PM  Result Value Ref Range Status   Specimen Description BILE  Final   Special Requests NONE  Final   Gram Stain   Final    FEW WBC PRESENT, PREDOMINANTLY PMN MODERATE GRAM NEGATIVE RODS RARE GRAM POSITIVE COCCI IN PAIRS RARE GRAM POSITIVE RODS Performed at [facility], [place] [address] [place], [place] [address]    Culture   Final    MODERATE ENTEROBACTER ASBURIAE MODERATE ENTEROCOCCUS FAECIUM NO ANAEROBES ISOLATED; CULTURE IN PROGRESS FOR 5 DAYS    Report Status PENDING  Incomplete   Organism ID, Bacteria ENTEROBACTER ASBURIAE  Final   Organism ID, Bacteria ENTEROCOCCUS FAECIUM  Final      Susceptibility   Enterobacter asburiae - MIC*    CEFAZOLIN >=64 RESISTANT Resistant     CEFEPIME <=1 SENSITIVE Sensitive     CEFTAZIDIME <=1 SENSITIVE Sensitive     CEFTRIAXONE <=1  SENSITIVE Sensitive     CIPROFLOXACIN <=0.25 SENSITIVE Sensitive     GENTAMICIN <=1 SENSITIVE Sensitive     IMIPENEM 0.5 SENSITIVE Sensitive     TRIMETH/SULFA <=20 SENSITIVE Sensitive     PIP/TAZO <=4 SENSITIVE Sensitive     * MODERATE ENTEROBACTER ASBURIAE   Enterococcus faecium - MIC*    AMPICILLIN <=2 SENSITIVE Sensitive     VANCOMYCIN <=0.5 SENSITIVE Sensitive     GENTAMICIN SYNERGY SENSITIVE Sensitive     * MODERATE ENTEROCOCCUS FAECIUM    FURTHER DISCHARGE INSTRUCTIONS:  Get Medicines reviewed and adjusted: Please take all your medications with you for your next visit with your Primary MD  Laboratory/radiological data: Please request your Primary MD to go over all hospital tests and procedure/radiological results at the follow up, please ask your Primary MD to get all Hospital records sent to his/her office.  In some cases, they will be blood work, cultures and biopsy results pending at the time of your discharge. Please request that your primary care M.D. goes through all the records of your hospital data and follows up on these results.  Also Note the following: If you experience worsening of your admission symptoms, develop shortness of breath, life threatening emergency, suicidal or homicidal thoughts you must seek medical attention immediately by calling 911 or calling your MD immediately  if symptoms less severe.  You must read complete instructions/literature along with all the possible adverse reactions/side effects for all the Medicines you take and that have been prescribed to you. Take any new Medicines after you have completely understood and accpet all the possible adverse reactions/side effects.   Do not drive when taking Pain medications or sleeping medications (Benzodaizepines)  Do not take more than prescribed Pain, Sleep and Anxiety Medications. It is not advisable to combine anxiety,sleep and pain medications without talking with your primary care  practitioner  Special Instructions: If you have smoked or chewed Tobacco  in the last 2 yrs please stop smoking, stop any regular Alcohol  and or any Recreational drug use.  Wear Seat belts while driving.  Please note: You were cared for by a hospitalist during your hospital stay. Once you are discharged, your primary care physician will handle any further medical issues. Please note that NO REFILLS for any discharge medications will be authorized once you are discharged, as it is imperative that you return to your primary care physician (or establish a relationship with a primary care physician if you do not have one) for your post hospital discharge needs so that they can reassess your need for medications and monitor your lab values.  Total Time spent coordinating discharge including counseling, education and face to face time equals 35  minutes.  SignedOren Binet 11/27/2018 10:36 AM

## 2018-11-27 NOTE — Progress Notes (Addendum)
[id number] Report given to [place] on pt returning to their facility.  Waiting for [facility] to arrive to transport pt to facility. Pt ready and waiting, wife and a caregiver at pt's bedside.  Mid-line out and PICC in place to RUE for IV antibiotics at SNF.  Copy of pt's d/c instructions given to pt's wife earlier this evening, and copy placed in discharge envelope, along with IR scripts for NS flushes and recording card for staff to document output of drain--this was explained to [place] at [facility]. Also placed PICC line information that IV team RN gave to pt's wife, wife wanted it placed in packet for facility. Script for Zosyn also placed in envelope.  Pt ready when transport arrives.  Report was given to oncoming RN Dannon at [phone] and will care for pt until [facility] arrives.

## 2018-11-27 NOTE — Progress Notes (Signed)
Peripherally Inserted Central Catheter/Midline Placement  The IV Nurse has discussed with the patient and/or persons authorized to consent for the patient, the purpose of this procedure and the potential benefits and risks involved with this procedure.  The benefits include less needle sticks, lab draws from the catheter, and the patient may be discharged home with the catheter. Risks include, but not limited to, infection, bleeding, blood clot (thrombus formation), and puncture of an artery; nerve damage and irregular heartbeat and possibility to perform a PICC exchange if needed/ordered by physician.  Alternatives to this procedure were also discussed.  Bard Power PICC patient education guide, fact sheet on infection prevention and patient information card has been provided to patient /or left at bedside.    PICC/Midline Placement Documentation  PICC Single Lumen 11/27/18 PICC Right Brachial 40 cm 0 cm (Active)  Indication for Insertion or Continuance of Line Home intravenous therapies (PICC only) 11/27/2018  3:43 PM  Exposed Catheter (cm) 0 cm 11/27/2018  3:43 PM  Site Assessment Clean;Dry;Intact 11/27/2018  3:43 PM  Line Status Flushed;Blood return noted;Saline locked 11/27/2018  3:43 PM  Dressing Type Transparent;Securing device 11/27/2018  3:43 PM  Dressing Status Clean;Dry;Intact;Antimicrobial disc in place 11/27/2018  3:43 PM  Dressing Change Due 12/04/18 11/27/2018  3:43 PM       William [name] 11/27/2018, 3:45 PM

## 2018-11-27 NOTE — Progress Notes (Signed)
Patient will DC to: [facility] Anticipated DC date: 11/27/2018 Family notified: Spouse Transport by: Corey Harold ~6:30pm (please contact [facility] if after 9pm)   Per MD patient ready for DC to [place]. RN, patient, patient's family, and facility notified of DC. Discharge Summary and FL2 sent to facility. RN to call report prior to discharge [phone]). DC packet on chart. Ambulance transport requested for patient.   CSW will sign off for now as social work intervention is no longer needed. Please consult [place] again if new needs arise.  Cedric Fishman, LCSW Clinical Social Worker [phone]

## 2018-11-27 NOTE — Consult Note (Signed)
   [facility] CM Inpatient Consult   2/51/8984  Aleksandr Pellow Mitton [birth year] [id number]  Follow up on disposition:  Patient is currently for returning to [facility].  No current [place] Management follow up.  For questions, please contact:  Natividad Brood, RN BSN [facility] Liaison  [phone] business mobile phone Toll free office [phone]

## 2018-11-27 NOTE — Progress Notes (Signed)
Referring Physician(s): Dr. Mannam/Dr. Sloan Leiter  Supervising Physician: Corrie Mckusick  Patient Status:  [facility] - In-pt  Chief Complaint: Follow up cholecystostomy  Subjective:  Patient sitting up in bed, just finished breakfast. C/o pain in right arm which wife states is ongoing. They are hopeful he will be discharged to SNF today and are wondering about follow up.   Allergies: Crestor [rosuvastatin]; Lipitor [atorvastatin]; and Shrimp [shellfish allergy]  Medications: Prior to Admission medications   Medication Sig Start Date End Date Taking? Authorizing Provider  acetaminophen (TYLENOL) 500 MG tablet Take 500 mg by mouth every 6 (six) hours as needed (for pain).    Yes [provider]  Amino Acids-Protein Hydrolys (FEEDING SUPPLEMENT, PRO-STAT SUGAR FREE 64,) LIQD Take 30 mLs by mouth daily.   Yes [provider]  amiodarone (PACERONE) 200 MG tablet Take 0.5 tablets (100 mg total) by mouth daily. 11/16/18  Yes Croitoru, Mihai, MD  aspirin 81 MG chewable tablet Chew 1 tablet (81 mg total) by mouth daily. 07/08/18  Yes Kroeger, Lorelee Cover., PA-C  cholecalciferol (VITAMIN D) 1000 units tablet Take 2,000 Units by mouth at bedtime.   Yes [provider]  clopidogrel (PLAVIX) 75 MG tablet Take 1 tablet (75 mg total) by mouth daily with breakfast. 08/21/18  Yes Eileen Stanford, PA-C  ezetimibe-simvastatin (VYTORIN) 10-20 MG per tablet Take 1 tablet by mouth at bedtime.   Yes [provider]  metoprolol succinate (TOPROL-XL) 25 MG 24 hr tablet Take 1 tablet (25 mg total) by mouth daily. Take with or immediately following a meal. 09/18/18  Yes Nita Sells, MD  Multiple Vitamins-Minerals (PRESERVISION AREDS 2 PO) Take 1 capsule by mouth daily.    Yes [provider]  polyethylene glycol (MIRALAX / GLYCOLAX) packet Take 17 g by mouth every evening. Patient taking differently: Take 1 g by mouth every evening. 1 teaspoon daily 01/21/17  Yes  Croitoru, Mihai, MD  triamcinolone (KENALOG) 0.025 % cream Apply 1 application topically 2 (two) times daily. Until resolved   Yes [provider]  bisacodyl (DULCOLAX) 10 MG suppository Place 1 suppository (10 mg total) rectally daily as needed for moderate constipation. 11/27/18   Ghimire, Henreitta Leber, MD  feeding supplement, ENSURE ENLIVE, (ENSURE ENLIVE) LIQD Take 237 mLs by mouth 2 (two) times daily between meals. 11/27/18   Ghimire, Henreitta Leber, MD  piperacillin-tazobactam (ZOSYN) IVPB Inject 3.375 g into the vein every 8 (eight) hours for 9 days. Indication:  Bacteremia Last Day of Therapy:  12/05/2018 Labs - Once weekly:  CBC/D and BMP, Labs - Every other week:  ESR and CRP 11/27/18 12/06/18  Ghimire, Henreitta Leber, MD     Vital Signs: BP 105/74 (BP Location: Left Arm)   Pulse 86   Temp 97.9 F (36.6 C) (Oral)   Resp 18   Ht _0  (1.778 m)   Wt 217 lb 9.5 oz (98.7 kg) Comment: extra items removed from bed and patients  SpO2 97%   BMI 31.22 kg/m   Physical Exam Vitals signs reviewed.  Constitutional:      General: He is not in acute distress.    Comments: Wife and daughter at bedside. Patient smiling, gives short answers to questions when asked.  HENT:     Head: Normocephalic.  Cardiovascular:     Rate and Rhythm: Normal rate.  Pulmonary:     Effort: Pulmonary effort is normal.  Abdominal:     General: There is no distension.     Palpations:  Abdomen is soft.     Tenderness: There is no abdominal tenderness.     Comments: (+) cholecystostomy to gravity - scant brown OP in bag; unable to evaluate insertion site today - patient refusing to move right arm due to pain. Drain flushes easily - demonstrated flushing to wife/daughter.   Musculoskeletal:     Comments: (+) midline RUE  Skin:    General: Skin is warm and dry.  Neurological:     Mental Status: He is alert. Mental status is at baseline.     Imaging: [place] Ekg Site Rite  Result Date: 11/26/2018 If Site Rite  image not attached, placement could not be confirmed due to current cardiac rhythm.   Labs:  CBC: Recent Labs    11/23/18 0355 11/23/18 0751 11/24/18 0423 11/25/18 0357 11/26/18 0430  WBC 6.4  --  4.9 5.7 5.8  HGB 10.3* 15.0 9.1* 10.2* 9.9*  HCT 31.0* 44.0 28.5* 32.9* 30.4*  PLT 105*  --  102* 123* 137*    COAGS: Recent Labs    08/07/18 1411 08/13/18 0409 11/21/18 0521 11/21/18 0851 11/22/18 1229  INR 1.25 1.09 1.20 1.33 1.39  APTT 34 37*  --  34  --     BMP: Recent Labs    11/23/18 0355 11/23/18 0751 11/24/18 0423 11/25/18 0357 11/26/18 0430  NA 141 142 142 139 138  K 3.1* 3.4* 2.9* 3.7 3.1*  CL 113*  --  114* 108 109  CO2 20*  --  21* 25 24  GLUCOSE 147*  --  113* 135* 133*  BUN 23  --  _0 CALCIUM 7.4*  --  7.5* 7.6* 7.6*  CREATININE 0.89  --  0.82 0.88 0.81  GFRNONAA >60  --  >60 >60 >60  GFRAA >60  --  >60 >60 >60    LIVER FUNCTION TESTS: Recent Labs    11/22/18 1229 11/23/18 0355 11/24/18 0423 11/25/18 0357  BILITOT 0.9 0.8 0.7 1.2  AST 36 46* 54* 65*  ALT 32 41 48* 58*  ALKPHOS 75 79 76 86  PROT 5.3* 5.3* 4.8* 5.2*  ALBUMIN 1.6* 1.7* 1.5* 1.7*    Assessment and Plan:  Patient s/p cholecystomy placed 11/23/18 by Dr. Earleen Newport for acute cholecystitis. Output remains good - 80 cc in last 24H. Afebrile, WBC 5.8, hgb 9.9, plt 137, t.bili 1.2, AST 65, ALT 58, ALP 86. No complaints of abdominal pain, nausea, vomiting, diarrhea - tolerating PO intake well.   Patient planned for d/c back to SNF - follow up orders placed, IR scheduler will call patient with appointment date and time (6-8 weeks from placement), drain must remain in at least 6 weeks. Orange drain card with care instructions in chart as well as drain care instructions written on d/c instructions. Patient to flush drain QD with 5 cc NS (rx for NS flushes provided), record drain OP once daily on orange drain card, keep insertion site clean, dry and dressed, do not submerge. Demonstrated  flushing technique to wife and daughter today at bedside. Encouraged to call IR with questions or concerns.   Electronically Signed: Joaquim Nam, PA-C 11/27/2018, 9:42 AM   I spent a total of 25 Minutes at the the patient's bedside AND on the patient's hospital floor or unit, greater than 50% of which was counseling/coordinating care for cholecystostomy.

## 2018-11-28 NOTE — Progress Notes (Signed)
William [name] to be D/C'd Skilled nursing facility per MD order.  Discussed with the patient and family and all questions fully answered (see prior RN note).   VSS, Skin clean condition documented.   IV catheter (midline) discontinued intact. Site without signs and symptoms of complications. Dressing and pressure applied.  An After Visit Summary was printed and given to the patient family and facility. Patient received prescription.  D/c education completed with patient/family including follow up instructions, medication list, d/c activities limitations if indicated, with other d/c instructions as indicated by MD - patient able to verbalize understanding, all questions fully answered.   Patient instructed to return to ED, call 911, or call MD for any changes in condition.   Patient transported via [facility] to [facility] facility.   Howard Pouch 11/28/2018 12:08 AM

## 2018-11-30 ENCOUNTER — Other Ambulatory Visit: Payer: Self-pay | Admitting: General Surgery

## 2018-11-30 ENCOUNTER — Other Ambulatory Visit (HOSPITAL_COMMUNITY): Payer: Self-pay | Admitting: Physician Assistant

## 2018-11-30 DIAGNOSIS — K810 Acute cholecystitis: Secondary | ICD-10-CM

## 2018-12-01 ENCOUNTER — Encounter: Payer: Self-pay | Admitting: Internal Medicine

## 2018-12-01 DIAGNOSIS — D649 Anemia, unspecified: Secondary | ICD-10-CM | POA: Diagnosis not present

## 2018-12-01 DIAGNOSIS — Z952 Presence of prosthetic heart valve: Secondary | ICD-10-CM | POA: Diagnosis not present

## 2018-12-01 DIAGNOSIS — Z79899 Other long term (current) drug therapy: Secondary | ICD-10-CM | POA: Diagnosis not present

## 2018-12-01 DIAGNOSIS — I5022 Chronic systolic (congestive) heart failure: Secondary | ICD-10-CM | POA: Diagnosis not present

## 2018-12-01 DIAGNOSIS — M2550 Pain in unspecified joint: Secondary | ICD-10-CM | POA: Diagnosis not present

## 2018-12-01 DIAGNOSIS — F0150 Vascular dementia without behavioral disturbance: Secondary | ICD-10-CM | POA: Diagnosis not present

## 2018-12-01 DIAGNOSIS — I714 Abdominal aortic aneurysm, without rupture: Secondary | ICD-10-CM | POA: Diagnosis not present

## 2018-12-01 DIAGNOSIS — A419 Sepsis, unspecified organism: Secondary | ICD-10-CM | POA: Diagnosis not present

## 2018-12-01 DIAGNOSIS — M62561 Muscle wasting and atrophy, not elsewhere classified, right lower leg: Secondary | ICD-10-CM | POA: Diagnosis not present

## 2018-12-01 DIAGNOSIS — R278 Other lack of coordination: Secondary | ICD-10-CM | POA: Diagnosis not present

## 2018-12-01 DIAGNOSIS — G9341 Metabolic encephalopathy: Secondary | ICD-10-CM | POA: Diagnosis not present

## 2018-12-01 DIAGNOSIS — K8012 Calculus of gallbladder with acute and chronic cholecystitis without obstruction: Secondary | ICD-10-CM | POA: Diagnosis not present

## 2018-12-01 DIAGNOSIS — R2689 Other abnormalities of gait and mobility: Secondary | ICD-10-CM | POA: Diagnosis not present

## 2018-12-01 DIAGNOSIS — M62562 Muscle wasting and atrophy, not elsewhere classified, left lower leg: Secondary | ICD-10-CM | POA: Diagnosis not present

## 2018-12-01 DIAGNOSIS — E7800 Pure hypercholesterolemia, unspecified: Secondary | ICD-10-CM | POA: Diagnosis not present

## 2018-12-01 DIAGNOSIS — R7881 Bacteremia: Secondary | ICD-10-CM | POA: Diagnosis not present

## 2018-12-01 DIAGNOSIS — R7982 Elevated C-reactive protein (CRP): Secondary | ICD-10-CM | POA: Diagnosis not present

## 2018-12-01 DIAGNOSIS — I2510 Atherosclerotic heart disease of native coronary artery without angina pectoris: Secondary | ICD-10-CM | POA: Diagnosis not present

## 2018-12-01 DIAGNOSIS — J9601 Acute respiratory failure with hypoxia: Secondary | ICD-10-CM | POA: Diagnosis not present

## 2018-12-01 LAB — BASIC METABOLIC PANEL
BUN: 13 (ref 4–21)
Creatinine: 0.8 (ref 0.6–1.3)
Glucose: 143
Potassium: 3.4 (ref 3.4–5.3)
Sodium: 138 (ref 137–147)

## 2018-12-01 LAB — CBC AND DIFFERENTIAL
HCT: 32 — AB (ref 41–53)
Hemoglobin: 11.1 — AB (ref 13.5–17.5)
Platelets: 185 (ref 150–399)
WBC: 7.8

## 2018-12-02 DIAGNOSIS — M62561 Muscle wasting and atrophy, not elsewhere classified, right lower leg: Secondary | ICD-10-CM | POA: Diagnosis not present

## 2018-12-02 DIAGNOSIS — R2689 Other abnormalities of gait and mobility: Secondary | ICD-10-CM | POA: Diagnosis not present

## 2018-12-02 DIAGNOSIS — A419 Sepsis, unspecified organism: Secondary | ICD-10-CM | POA: Diagnosis not present

## 2018-12-02 DIAGNOSIS — M62562 Muscle wasting and atrophy, not elsewhere classified, left lower leg: Secondary | ICD-10-CM | POA: Diagnosis not present

## 2018-12-02 DIAGNOSIS — F0150 Vascular dementia without behavioral disturbance: Secondary | ICD-10-CM | POA: Diagnosis not present

## 2018-12-02 DIAGNOSIS — R278 Other lack of coordination: Secondary | ICD-10-CM | POA: Diagnosis not present

## 2018-12-03 ENCOUNTER — Encounter: Payer: Self-pay | Admitting: Family

## 2018-12-03 ENCOUNTER — Telehealth: Payer: Self-pay

## 2018-12-03 ENCOUNTER — Inpatient Hospital Stay: Payer: Medicare Other | Admitting: Family

## 2018-12-03 ENCOUNTER — Ambulatory Visit (INDEPENDENT_AMBULATORY_CARE_PROVIDER_SITE_OTHER): Payer: Medicare Other | Admitting: Family

## 2018-12-03 VITALS — BP 100/67 | HR 97 | Temp 98.0°F

## 2018-12-03 DIAGNOSIS — Z7401 Bed confinement status: Secondary | ICD-10-CM | POA: Diagnosis not present

## 2018-12-03 DIAGNOSIS — B9689 Other specified bacterial agents as the cause of diseases classified elsewhere: Secondary | ICD-10-CM

## 2018-12-03 DIAGNOSIS — R410 Disorientation, unspecified: Secondary | ICD-10-CM | POA: Diagnosis not present

## 2018-12-03 DIAGNOSIS — M62561 Muscle wasting and atrophy, not elsewhere classified, right lower leg: Secondary | ICD-10-CM | POA: Diagnosis not present

## 2018-12-03 DIAGNOSIS — M2550 Pain in unspecified joint: Secondary | ICD-10-CM | POA: Diagnosis not present

## 2018-12-03 DIAGNOSIS — R2689 Other abnormalities of gait and mobility: Secondary | ICD-10-CM | POA: Diagnosis not present

## 2018-12-03 DIAGNOSIS — R278 Other lack of coordination: Secondary | ICD-10-CM | POA: Diagnosis not present

## 2018-12-03 DIAGNOSIS — R5381 Other malaise: Secondary | ICD-10-CM | POA: Diagnosis not present

## 2018-12-03 DIAGNOSIS — M62562 Muscle wasting and atrophy, not elsewhere classified, left lower leg: Secondary | ICD-10-CM | POA: Diagnosis not present

## 2018-12-03 DIAGNOSIS — R7881 Bacteremia: Secondary | ICD-10-CM | POA: Diagnosis not present

## 2018-12-03 DIAGNOSIS — A419 Sepsis, unspecified organism: Secondary | ICD-10-CM | POA: Diagnosis not present

## 2018-12-03 DIAGNOSIS — K8309 Other cholangitis: Secondary | ICD-10-CM | POA: Diagnosis not present

## 2018-12-03 DIAGNOSIS — F0150 Vascular dementia without behavioral disturbance: Secondary | ICD-10-CM | POA: Diagnosis not present

## 2018-12-03 NOTE — Patient Instructions (Signed)
Nice to see you.  Please continue to take your Zoysn through 12/05/18.  Orders placed to remove PICC line after 2/22.  Will plan for recheck of blood cultures after 2 weeks off antibiotics with lab visit.   Drain care per general surgery/interventional radiology.  Monitor for return of symptoms including nausea, confusion, and fevers.

## 2018-12-03 NOTE — Telephone Encounter (Signed)
Error

## 2018-12-03 NOTE — Assessment & Plan Note (Signed)
William [name] has Enterococcus faecium and Enterobacter absuriae cholangitis s/p percutaneous drain placement and is nearing completion of 2 weeks of piperacillin-tazobactam. He has not current symptoms and appears source control has been achieved with the percutaneous drain. Given obtained source control 2 weeks of antimicrobial therapy appears sufficient for abdominal infection. Will plan to complete course of piperacillin-tazobactam with end date of 12/05/18 and ensure facility has orders to remove PICC line. Percutaneous drain management and removal per general surgery and interventional radiology.

## 2018-12-03 NOTE — Assessment & Plan Note (Signed)
William [name] had Enterobacter cloacae bacteremia likely of GI source. He has had no further fevers, abdominal pain or vomiting. He will be completing 2 weeks of piperacillin-tazobactam on 12/05/18. Will plan for repeat surveillance cultures in two weeks.

## 2018-12-03 NOTE — Progress Notes (Signed)
Subjective:    Patient ID: William [name], male    DOB: 1927/12/24, 85 y.o.   MRN: [id number]  Chief Complaint  Patient presents with  . Follow-up     HPI:  William [name]  is a 85 y.o. male with previous medical history or sleep apnea, prostate cancer, dementia, chronic systolic heart failure s/p TAVR (08/2018) and ICD (placed 2015 and removed 09/2018), and diarrhea who presents today for hospitalization follow up. His daughter is present for today's visit.   William [name] was admitted to [facility] from his skilled nursing facility with fevers, chills, vomiting and chronic aspiration. He had recently completed 6 weeks of Invanz for ESBL producing Klebsiella bacteremia in January 2020. He was initially intubated for airway protection. CT of the abdomen with cholelithiasis and suspicion for acute cholecystitis on ultrasound. Percutaneous drain was placed with aspiration cultures positive for Enterococcus faecium and Enterobacter Asburiae. Blood cultures were positive for Enterobacter Cloacae. Enterococcus was ampicillin sensitive and was placed on 2 weeks of IV piperacillin-tazobactam. All hospital records, lab and imaging.   Since leaving the hospital William [name] has been at [facility] and slowly improving. He continues to receive his piperacillin-tazobactam scheduled through 12/05/18 with no adverse side effects or missed doses. Denies fevers, chills, or abdominal pain. Drain remains in place and followed by general surgery and interventional radiology. [facility] records reviewed.    Allergies  Allergen Reactions  . Crestor [Rosuvastatin] Other (See Comments)    Hurts muscles  . Lipitor [Atorvastatin] Other (See Comments)    Hurts stomach  . Shrimp [Shellfish Allergy] Other (See Comments)    On MAR      Outpatient Medications Prior to Visit  Medication Sig Dispense Refill  . acetaminophen (TYLENOL) 500 MG tablet Take 500 mg by mouth every 6 (six) hours as needed (for  pain).     . Amino Acids-Protein Hydrolys (FEEDING SUPPLEMENT, PRO-STAT SUGAR FREE 64,) LIQD Take 30 mLs by mouth daily.    Marland Kitchen amiodarone (PACERONE) 200 MG tablet Take 0.5 tablets (100 mg total) by mouth daily. 45 tablet 3  . aspirin 81 MG chewable tablet Chew 1 tablet (81 mg total) by mouth daily. 90 tablet 3  . bisacodyl (DULCOLAX) 10 MG suppository Place 1 suppository (10 mg total) rectally daily as needed for moderate constipation. 12 suppository 0  . cholecalciferol (VITAMIN D) 1000 units tablet Take 2,000 Units by mouth at bedtime.    . clopidogrel (PLAVIX) 75 MG tablet Take 1 tablet (75 mg total) by mouth daily with breakfast. 90 tablet 1  . feeding supplement, ENSURE ENLIVE, (ENSURE ENLIVE) LIQD Take 237 mLs by mouth 2 (two) times daily between meals. 237 mL 12  . metoprolol succinate (TOPROL-XL) 25 MG 24 hr tablet Take 1 tablet (25 mg total) by mouth daily. Take with or immediately following a meal.    . Multiple Vitamins-Minerals (PRESERVISION AREDS 2 PO) Take 1 capsule by mouth daily.     . piperacillin-tazobactam (ZOSYN) IVPB Inject 3.375 g into the vein every 8 (eight) hours for 9 days. Indication:  Bacteremia Last Day of Therapy:  12/05/2018 Labs - Once weekly:  CBC/D and BMP, Labs - Every other week:  ESR and CRP 27 Units 0  . polyethylene glycol (MIRALAX / GLYCOLAX) packet Take 17 g by mouth every evening. (Patient taking differently: Take 1 g by mouth every evening. 1 teaspoon daily) 14 each 1  . triamcinolone (KENALOG) 0.025 % cream Apply 1 application topically 2 (two)  times daily. Until resolved     No facility-administered medications prior to visit.      Past Medical History:  Diagnosis Date  . AAA (abdominal aortic aneurysm) ([place])    7/13 3.8cm  . Arthritis    "joints" (01/11/2014)  . Asthma    "seasonal; some foods"   . Chronic systolic CHF (congestive heart failure) ([place])   . Dementia ([place])   . HLD (hyperlipidemia)   . HOH (hard of hearing)   . Lumbar vertebral  fracture ([facility])    L1- 04/11/2014   . Prostate cancer ([place])   . S/P ICD (internal cardiac defibrillator) procedure, 01/11/14 removal of ERI gen and placement of Medtronic Evera XT VR & NEW Right ventricular lead Medtronic 01/12/2014  . S/P TAVR (transcatheter aortic valve replacement)    Edwards Sapien 3 THV (size 29 mm, model # B6411258, serial # [id number])  . Severe aortic stenosis   . Sleep apnea    "lost 60# & don't have it anymore" (01/11/2014)  . Ventricular tachycardia [facility])      Past Surgical History:  Procedure Laterality Date  . CARDIAC CATHETERIZATION  08/20/2004   noncritical CAD,mild global hypokinesis, EF 50%  . CARDIAC DEFIBRILLATOR PLACEMENT  08/23/2004   Medtronic  . CATARACT EXTRACTION W/ INTRAOCULAR LENS IMPLANT Right   . COLECTOMY  1990's  . CRYOABLATION N/A 02/24/2014   Procedure: CRYO ABLATION PROSTATE;  Surgeon: Ailene Rud, MD;  Location: [facility] ORS;  Service: Urology;  Laterality: N/A;  . HERNIA REPAIR     "abdomen; from colon OR"  . ICD LEAD REMOVAL N/A 09/15/2018   Procedure: ICD LEAD REMOVAL EXTRACTION;  Surgeon: Evans Lance, MD;  Location: [facility] OR;  Service: Cardiovascular;  Laterality: N/A;  DR. BARTLE TO BACK UP  . IMPLANTABLE CARDIOVERTER DEFIBRILLATOR (ICD) GENERATOR CHANGE N/A 01/11/2014   Procedure: ICD GENERATOR CHANGE;  Surgeon: Sanda Klein, MD;  Location: [place] CATH LAB;  Service: Cardiovascular;  Laterality: N/A;  . IR PERC CHOLECYSTOSTOMY  11/22/2018  . LEAD REVISION N/A 01/11/2014   Procedure: LEAD REVISION;  Surgeon: Sanda Klein, MD;  Location: [place] CATH LAB;  Service: Cardiovascular;  Laterality: N/A;  . NM MYOCAR PERF WALL MOTION  01/29/2012   abnormal c/o infarct/scar,no ischemia present  . PROSTATE BIOPSY N/A 11/22/2013   Procedure: PROSTATE BIOPSY AND ULTRASOUND;  Surgeon: Ailene Rud, MD;  Location: [facility] ORS;  Service: Urology;  Laterality: N/A;  . RIGHT/LEFT HEART CATH AND CORONARY ANGIOGRAPHY N/A 07/06/2018   Procedure: RIGHT/LEFT  HEART CATH AND CORONARY ANGIOGRAPHY;  Surgeon: Troy Sine, MD;  Location: [place] CV LAB;  Service: Cardiovascular;  Laterality: N/A;  . TEE WITHOUT CARDIOVERSION N/A 08/18/2018   Procedure: TRANSESOPHAGEAL ECHOCARDIOGRAM (TEE);  Surgeon: Burnell Blanks, MD;  Location: [place];  Service: Open Heart Surgery;  Laterality: N/A;  . TEE WITHOUT CARDIOVERSION N/A 09/08/2018   Procedure: TRANSESOPHAGEAL ECHOCARDIOGRAM (TEE);  Surgeon: Lelon Perla, MD;  Location: [facility] ENDOSCOPY;  Service: Cardiovascular;  Laterality: N/A;  . TEE WITHOUT CARDIOVERSION N/A 09/15/2018   Procedure: TRANSESOPHAGEAL ECHOCARDIOGRAM (TEE);  Surgeon: Evans Lance, MD;  Location: [facility] OR;  Service: Cardiovascular;  Laterality: N/A;  . TEE WITHOUT CARDIOVERSION N/A 11/26/2018   Procedure: TRANSESOPHAGEAL ECHOCARDIOGRAM (TEE);  Surgeon: Jerline Pain, MD;  Location[facility]COPY;  Service: Cardiovascular;  Laterality: N/A;  . TRANSCATHETER AORTIC VALVE REPLACEMENT, TRANSFEMORAL  08/18/2018  . TRANSCATHETER AORTIC VALVE REPLACEMENT, TRANSFEMORAL N/A 08/18/2018   Procedure: TRANSCATHETER AORTIC VALVE REPLACEMENT, TRANSFEMORAL using an Oletta Lamas  34m Aortic Valve;  Surgeon: MBurnell Blanks MD;  [place]  Service: Open Heart Surgery;  Laterality: N/A;     Review of Systems  Constitutional: Negative for chills, fatigue, fever and unexpected weight change.  Respiratory: Negative for chest tightness, shortness of breath and wheezing.   Cardiovascular: Negative for chest pain and palpitations.  Gastrointestinal: Negative for abdominal pain, constipation, diarrhea, nausea and vomiting.  Genitourinary: Negative for discharge, dysuria, frequency, hematuria, penile pain, penile swelling and urgency.      Objective:    BP 100/67   Pulse 97   Temp 98 F (36.7 C)  Nursing note and vital signs reviewed.  Physical Exam Constitutional:      General: He is not in acute distress.    Appearance: He is  well-developed.     Comments: Elderly gentleman, hard of hearing; lying on ambulance stretcher with head elevated.   Cardiovascular:     Rate and Rhythm: Normal rate and regular rhythm.     Heart sounds: Normal heart sounds.     Comments: PICC line in right upper arm with dressing that is clean and dry appears without evidence of infection.  Pulmonary:     Effort: Pulmonary effort is normal.     Breath sounds: Normal breath sounds.  Abdominal:     Comments: Percutaneous drain in upper right quadrant is patent and with no evidence of infection.   Skin:    General: Skin is warm and dry.  Neurological:     Mental Status: He is alert.        Assessment & Plan:   Problem List Items Addressed This Visit      Digestive   Cholangitis - Primary    Mr. CBalthazorhas Enterococcus faecium and Enterobacter absuriae cholangitis s/p percutaneous drain placement and is nearing completion of 2 weeks of piperacillin-tazobactam. He has not current symptoms and appears source control has been achieved with the percutaneous drain. Given obtained source control 2 weeks of antimicrobial therapy appears sufficient for abdominal infection. Will plan to complete course of piperacillin-tazobactam with end date of 12/05/18 and ensure facility has orders to remove PICC line. Percutaneous drain management and removal per general surgery and interventional radiology.          Other   Bacteremia due to Enterobacter species    William [name] had Enterobacter cloacae bacteremia likely of GI source. He has had no further fevers, abdominal pain or vomiting. He will be completing 2 weeks of piperacillin-tazobactam on 12/05/18. Will plan for repeat surveillance cultures in two weeks.            I am having JDoyne KeelCallicott maintain his acetaminophen, polyethylene glycol, Multiple Vitamins-Minerals (PRESERVISION AREDS 2 PO), cholecalciferol, aspirin, clopidogrel, metoprolol succinate, triamcinolone, amiodarone,  feeding supplement (PRO-STAT SUGAR FREE 64), piperacillin-tazobactam, feeding supplement (ENSURE ENLIVE), and bisacodyl.   Follow-up: Return in about 2 months (around 02/01/2019), or if symptoms worsen or fail to improve.   GTerri Piedra MSN, FNP-C Nurse Practitioner [facility] Office phone: [phone]: 3[place] Citynumber: [phone]

## 2018-12-08 ENCOUNTER — Non-Acute Institutional Stay (SKILLED_NURSING_FACILITY): Payer: Medicare Other | Admitting: Internal Medicine

## 2018-12-08 ENCOUNTER — Inpatient Hospital Stay: Payer: Medicare Other | Admitting: Family

## 2018-12-08 ENCOUNTER — Encounter: Payer: Self-pay | Admitting: Internal Medicine

## 2018-12-08 DIAGNOSIS — I5022 Chronic systolic (congestive) heart failure: Secondary | ICD-10-CM | POA: Diagnosis not present

## 2018-12-08 DIAGNOSIS — Z8679 Personal history of other diseases of the circulatory system: Secondary | ICD-10-CM | POA: Diagnosis not present

## 2018-12-08 DIAGNOSIS — A4159 Other Gram-negative sepsis: Secondary | ICD-10-CM

## 2018-12-08 DIAGNOSIS — R410 Disorientation, unspecified: Secondary | ICD-10-CM | POA: Diagnosis not present

## 2018-12-08 DIAGNOSIS — F0150 Vascular dementia without behavioral disturbance: Secondary | ICD-10-CM

## 2018-12-08 DIAGNOSIS — Z7189 Other specified counseling: Secondary | ICD-10-CM | POA: Diagnosis not present

## 2018-12-08 DIAGNOSIS — K812 Acute cholecystitis with chronic cholecystitis: Secondary | ICD-10-CM

## 2018-12-08 DIAGNOSIS — M62561 Muscle wasting and atrophy, not elsewhere classified, right lower leg: Secondary | ICD-10-CM | POA: Diagnosis not present

## 2018-12-08 DIAGNOSIS — R634 Abnormal weight loss: Secondary | ICD-10-CM

## 2018-12-08 DIAGNOSIS — B351 Tinea unguium: Secondary | ICD-10-CM | POA: Diagnosis not present

## 2018-12-08 DIAGNOSIS — T85520S Displacement of bile duct prosthesis, sequela: Secondary | ICD-10-CM

## 2018-12-08 DIAGNOSIS — R278 Other lack of coordination: Secondary | ICD-10-CM | POA: Diagnosis not present

## 2018-12-08 DIAGNOSIS — R531 Weakness: Secondary | ICD-10-CM | POA: Diagnosis not present

## 2018-12-08 DIAGNOSIS — M62562 Muscle wasting and atrophy, not elsewhere classified, left lower leg: Secondary | ICD-10-CM | POA: Diagnosis not present

## 2018-12-08 DIAGNOSIS — A419 Sepsis, unspecified organism: Secondary | ICD-10-CM | POA: Diagnosis not present

## 2018-12-08 DIAGNOSIS — R2689 Other abnormalities of gait and mobility: Secondary | ICD-10-CM | POA: Diagnosis not present

## 2018-12-08 NOTE — Progress Notes (Signed)
Patient ID: William [name], male   DOB: [birth year], 85 y.o.   MRN: [id number]  Provider:  Rexene Edison. Mariea Clonts, D.O., C.M.D. Location:  [facility] Room Number: 433 Rehab admission Place of Service:  SNF (31)  PCP: Tisovec, Fransico Him, MD Patient Care Team: Tisovec, Fransico Him, MD as PCP - General (Internal Medicine) Croitoru, Dani Gobble, MD as PCP - Cardiology (Cardiology) Carolan Clines, MD (Inactive) as Consulting Physician (Urology)  Extended Emergency Contact Information Primary Emergency Contact: [facility] Address: [address]          [place], [place] [address] [name] of [place] Phone: [phone] Mobile Phone: [phone] Relation: Spouse Secondary Emergency Contact: Michail Sermon, [place] of [place] Phone: [phone] Mobile Phone: [phone] Relation: Daughter  Code Status: FULL CODE Goals of Care: Advanced Directive information Advanced Directives 12/08/2018  Does Patient Have a Medical Advance Directive? No  Type of Advance Directive -  Does patient want to make changes to medical advance directive? -  Copy of [place] in Chart? -  Would patient like information on creating a medical advance directive? No - Patient declined  Pre-existing out of facility DNR order (yellow form or pink MOST form) -      Chief Complaint  Patient presents with  . New Admit To SNF    Rehab admission    HPI: Patient is a 85 y.o. male seen today for readmission to [facility] rehab s/p another hospitalization.  Mr. Kats has dementia, likely mix of vascular and AD, infection of his ICD (which was removed)/acute bacterial endocarditis, klebsiella pneumoniae sepsis for which he completed 6 weeks of IV abx via picc line here (meropenem/ertapenem that started 10/27/18, VT (now w/o ICD and family requests full code), chronic systolic chf, AAA, vascular dementia with some behaviors,  recent TAVR, prostate cancer, hyperlipidemia, nonischemic cardiomyopathy, nonobstructive CAD, fx of distal ulna, and generalized weakness from ICU stay.  Since he was admitted, he's struggled with rashes and had a fall with head laceration.    He then had bilious vomiting on 2/6. zofran was given.  Orders were written for labs and abdominal xray if symptoms persisted and c diff if loose stools presented.  2 days later on 2/8, he was sent out to the hospital with acute metabolic encephalopathy and severe sepsis determined to be due to acute cholecystitis and enterobacter bacteremia with positive blood cultures and bile cultures.  TEE was negative for vegetations of the aortic valve.  He was prescribed zosyn via PICC at ID recommendation to be completed 2/22.  Reportedly, his mentation returned to baseline.  His had been intubated for respiratory failure and was titrated back to RA now.  Volume status remained stable.  He was sent back to [facility] rehab on 11/27/2018.for PT.  2/18 labs showed normal wbc of 7.8 and cr 0.8.      When seen, he interacted minimally.  He would speak and attempt to respond, but cannot provide full sentences of meaningful speech.  He is very HOH, as well.  He continues to rest in the bed.  His wife was visiting.  She notes that he has taken some major steps backward with this last hospitalization.  The social worker has offered them a permanent bed in the SNF unit, but Mrs. Fusco is very clear that she thinks he is going to get stronger and go back home with her with caregiver assistance.  She  goes back to when he was ambulating with a walker room to room before his hospitalization at the end of Nov '19 when he had the sepsis that infected his ICD lead and his ICD had to be removed.  Therapy and staff have been indicating to her that he's not been making much progress, but she remains hopeful that he will recover to that point.  Since the ICD was removed (in place due to VT), she has  requested his code status to be full code.    With his cholecystitis episode, drain remains in place with green to brown drainage which is minimal.  He has some mild tenderness of that area.  He remains a bit more lethargic than prior to this last admission.  Less talkative and weak.      We reviewed that the team would keep working with him and come back around with an update about his progress; however, he is at risk of the gall bladder condition never resolving or becoming more fulminant and he is a very poor, high risk surgical patient.    Past Medical History:  Diagnosis Date  . AAA (abdominal aortic aneurysm) ([place])    7/13 3.8cm  . Arthritis    "joints" (01/11/2014)  . Asthma    "seasonal; some foods"   . Chronic systolic CHF (congestive heart failure) ([place])   . Dementia ([place])   . HLD (hyperlipidemia)   . HOH (hard of hearing)   . Lumbar vertebral fracture ([facility])    L1- 04/11/2014   . Prostate cancer ([place])   . S/P ICD (internal cardiac defibrillator) procedure, 01/11/14 removal of ERI gen and placement of Medtronic Evera XT VR & NEW Right ventricular lead Medtronic 01/12/2014  . S/P TAVR (transcatheter aortic valve replacement)    Edwards Sapien 3 THV (size 29 mm, model # [id number], serial # [id number])  . Severe aortic stenosis   . Sleep apnea    "lost 60# & don't have it anymore" (01/11/2014)  . Ventricular tachycardia [facility])    Past Surgical History:  Procedure Laterality Date  . CARDIAC CATHETERIZATION  08/20/2004   noncritical CAD,mild global hypokinesis, EF 50%  . CARDIAC DEFIBRILLATOR PLACEMENT  08/23/2004   Medtronic  . CATARACT EXTRACTION W/ INTRAOCULAR LENS IMPLANT Right   . COLECTOMY  1990's  . CRYOABLATION N/A 02/24/2014   Procedure: CRYO ABLATION PROSTATE;  Surgeon: Ailene Rud, MD;  Location: [facility] ORS;  Service: Urology;  Laterality: N/A;  . HERNIA REPAIR     "abdomen; from colon OR"  . ICD LEAD REMOVAL N/A 09/15/2018   Procedure: ICD LEAD REMOVAL EXTRACTION;   Surgeon: Evans Lance, MD;  Location: [facility] OR;  Service: Cardiovascular;  Laterality: N/A;  DR. BARTLE TO BACK UP  . IMPLANTABLE CARDIOVERTER DEFIBRILLATOR (ICD) GENERATOR CHANGE N/A 01/11/2014   Procedure: ICD GENERATOR CHANGE;  Surgeon: Sanda Klein, MD;  Location: [facility] CATH LAB;  Service: Cardiovascular;  Laterality: N/A;  . IR PERC CHOLECYSTOSTOMY  11/22/2018  . LEAD REVISION N/A 01/11/2014   Procedure: LEAD REVISION;  Surgeon: Sanda Klein, MD;  Location: [facility] CATH LAB;  Service: Cardiovascular;  Laterality: N/A;  . NM MYOCAR PERF WALL MOTION  01/29/2012   abnormal c/o infarct/scar,no ischemia present  . PROSTATE BIOPSY N/A 11/22/2013   Procedure: PROSTATE BIOPSY AND ULTRASOUND;  Surgeon: Ailene Rud, MD;  Location: [facility] ORS;  Service: Urology;  Laterality: N/A;  . RIGHT/LEFT HEART CATH AND CORONARY ANGIOGRAPHY N/A 07/06/2018   Procedure: RIGHT/LEFT HEART CATH AND CORONARY  ANGIOGRAPHY;  Surgeon: Troy Sine, MD;  Location: [facility] CV LAB;  Service: Cardiovascular;  Laterality: N/A;  . TEE WITHOUT CARDIOVERSION N/A 08/18/2018   Procedure: TRANSESOPHAGEAL ECHOCARDIOGRAM (TEE);  Surgeon: Burnell Blanks, MD;  Location: [place];  Service: Open Heart Surgery;  Laterality: N/A;  . TEE WITHOUT CARDIOVERSION N/A 09/08/2018   Procedure: TRANSESOPHAGEAL ECHOCARDIOGRAM (TEE);  Surgeon: Lelon Perla, MD;  Location: [facility] ENDOSCOPY;  Service: Cardiovascular;  Laterality: N/A;  . TEE WITHOUT CARDIOVERSION N/A 09/15/2018   Procedure: TRANSESOPHAGEAL ECHOCARDIOGRAM (TEE);  Surgeon: Evans Lance, MD;  Location: [facility] OR;  Service: Cardiovascular;  Laterality: N/A;  . TEE WITHOUT CARDIOVERSION N/A 11/26/2018   Procedure: TRANSESOPHAGEAL ECHOCARDIOGRAM (TEE);  Surgeon: Jerline Pain, MD;  Location: [facility] ENDOSCOPY;  Service: Cardiovascular;  Laterality: N/A;  . TRANSCATHETER AORTIC VALVE REPLACEMENT, TRANSFEMORAL  08/18/2018  . TRANSCATHETER AORTIC VALVE REPLACEMENT, TRANSFEMORAL N/A 08/18/2018    Procedure: TRANSCATHETER AORTIC VALVE REPLACEMENT, TRANSFEMORAL using an Edwards 81m Aortic Valve;  Surgeon: MBurnell Blanks MD;  Location[place]  Service: Open Heart Surgery;  Laterality: N/A;    reports that he has never smoked. He has never used smokeless tobacco. He reports current alcohol use. He reports that he does not use drugs. Social History   Socioeconomic History  . Marital status: Married    Spouse name: Not on file  . Number of children: 2  . Years of education: Not on file  . Highest education level: Not on file  Occupational History  . 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 Financial resource strain: Not on file  . Food insecurity:    Worry: Not on file    Inability: Not on file  . Transportation needs:    Medical: Not on file    Non-medical: Not on file  Tobacco Use  . Smoking status: Never Smoker  . Smokeless tobacco: Never Used  Substance and Sexual Activity  . Alcohol use: Yes    Alcohol/week: 0.0 standard drinks    Comment: 01/11/2014 "drink 1/2 of a beer twice per month   . Drug use: No  . Sexual activity: Not Currently  Lifestyle  . Physical activity:    Days per week: Not on file    Minutes per session: Not on file  . Stress: Not on file  Relationships  . Social connections:    Talks on phone: Not on file    Gets together: Not on file    Attends religious service: Not on file    Active member of club or organization: Not on file    Attends meetings of clubs or organizations: Not on file    Relationship status: Not on file  . Intimate partner violence:    Fear of current or ex partner: Not on file    Emotionally abused: Not on file    Physically abused: Not on file    Forced sexual activity: Not on file  Other Topics Concern  . Not on file  Social History Narrative  . Not on file    Functional Status Survey:    Family History  Problem Relation Age of Onset  . Heart failure Mother        Died of "old age"  at 951 . Pneumonia Father     Health Maintenance  Topic Date Due  . TETANUS/TDAP  11/02/1946  . INFLUENZA VACCINE  Completed  . PNA vac Low Risk Adult  Completed    Allergies  Allergen Reactions  . Crestor [Rosuvastatin] Other (See Comments)    Hurts muscles  . Lipitor [Atorvastatin] Other (See Comments)    Hurts stomach  . Shrimp [Shellfish Allergy] Other (See Comments)    On MAR    Outpatient Encounter Medications as of 12/08/2018  Medication Sig  . acetaminophen (TYLENOL) 500 MG tablet Take 500 mg by mouth every 6 (six) hours as needed (for pain).   Marland Kitchen amiodarone (PACERONE) 200 MG tablet Take 0.5 tablets (100 mg total) by mouth daily.  Marland Kitchen aspirin 81 MG chewable tablet Chew 1 tablet (81 mg total) by mouth daily.  . bisacodyl (DULCOLAX) 10 MG suppository Place 1 suppository (10 mg total) rectally daily as needed for moderate constipation.  . cholecalciferol (VITAMIN D) 1000 units tablet Take 2,000 Units by mouth at bedtime.  . clopidogrel (PLAVIX) 75 MG tablet Take 1 tablet (75 mg total) by mouth daily with breakfast.  . metoprolol succinate (TOPROL-XL) 25 MG 24 hr tablet Take 1 tablet (25 mg total) by mouth daily. Take with or immediately following a meal.  . Multiple Vitamins-Minerals (PRESERVISION AREDS 2 PO) Take 1 capsule by mouth daily.   . polyethylene glycol (MIRALAX / GLYCOLAX) packet Take 17 g by mouth every evening.  . [DISCONTINUED] Amino Acids-Protein Hydrolys (FEEDING SUPPLEMENT, PRO-STAT SUGAR FREE 64,) LIQD Take 30 mLs by mouth daily.  . [DISCONTINUED] feeding supplement, ENSURE ENLIVE, (ENSURE ENLIVE) LIQD Take 237 mLs by mouth 2 (two) times daily between meals.  . [DISCONTINUED] triamcinolone (KENALOG) 0.025 % cream Apply 1 application topically 2 (two) times daily. Until resolved   No facility-administered encounter medications on file as of 12/08/2018.     Review of Systems  Constitutional: Positive for activity change, fatigue and unexpected weight change.  Negative for appetite change, chills and fever.       Weight loss  HENT: Positive for hearing loss. Negative for congestion.   Respiratory: Negative for chest tightness and shortness of breath.   Cardiovascular: Positive for leg swelling. Negative for chest pain and palpitations.  Gastrointestinal: Positive for abdominal pain. Negative for blood in stool, constipation, diarrhea, nausea and vomiting.       RUQ  Genitourinary: Negative for dysuria.  Musculoskeletal: Positive for gait problem.  Skin: Negative for color change.  Neurological: Positive for weakness. Negative for dizziness.  Hematological: Does not bruise/bleed easily.  Psychiatric/Behavioral: Positive for confusion. Negative for agitation, behavioral problems and sleep disturbance.    Vitals:   12/08/18 1019  BP: 103/71  Pulse: 93  Resp: 18  Temp: 98.4 F (36.9 C)  TempSrc: Oral  SpO2: 97%  Weight: 208 lb (94.3 kg)  Height: '5\' 10"'  (1.778 m)   Body mass index is 29.84 kg/m. Physical Exam Vitals signs and nursing note reviewed.  Constitutional:      General: He is not in acute distress.    Appearance: He is not toxic-appearing.     Comments: Awake, but less alert  HENT:     Head: Normocephalic and atraumatic.     Ears:     Comments: HOH    Nose: No congestion.     Mouth/Throat:     Pharynx: Oropharynx is clear.  Eyes:     Extraocular Movements: Extraocular movements intact.     Pupils: Pupils are equal, round, and reactive to light.  Cardiovascular:     Rate and Rhythm: Normal rate and regular rhythm.     Heart sounds: Normal heart sounds.  Pulmonary:     Effort: Pulmonary effort  is normal.     Breath sounds: Normal breath sounds. No rales.  Abdominal:     General: Bowel sounds are normal. There is no distension.     Palpations: Abdomen is soft. There is no mass.     Tenderness: There is abdominal tenderness. There is no guarding or rebound.     Comments: RUQ with drain in place with green-brown  biliary drainage, slight erythema around edges of drain; slight tenderness of RUQ  Musculoskeletal: Normal range of motion.  Skin:    General: Skin is warm and dry.  Neurological:     General: No focal deficit present.     Comments: Oriented to person, place, not time  Psychiatric:        Mood and Affect: Mood normal.     Labs reviewed: Basic Metabolic Panel: Recent Labs    11/24/18 0423 11/25/18 0357 11/26/18 0430 11/27/18 0404 12/01/18 0700  NA 142 139 138  --  138  K 2.9* 3.7 3.1*  --  3.4  CL 114* 108 109  --   --   CO2 21* 25 24  --   --   GLUCOSE 113* 135* 133*  --   --   BUN '17 14 12  ' --  13  CREATININE 0.82 0.88 0.81  --  0.8  CALCIUM 7.5* 7.6* 7.6*  --   --   MG  --  2.0 2.0 2.0  --   PHOS  --  2.2* 2.7 3.3  --    Liver Function Tests: Recent Labs    11/23/18 0355 11/24/18 0423 11/25/18 0357  AST 46* 54* 65*  ALT 41 48* 58*  ALKPHOS 79 76 86  BILITOT 0.8 0.7 1.2  PROT 5.3* 4.8* 5.2*  ALBUMIN 1.7* 1.5* 1.7*   Recent Labs    08/07/18 1411 11/21/18 0851 11/22/18 1229  LIPASE '26 20 18  ' AMYLASE 19* 10* 8*   No results for input(s): AMMONIA in the last 8760 hours. CBC: Recent Labs    09/21/18 0413  10/28/18 1629 11/21/18 0521  11/24/18 0423 11/25/18 0357 11/26/18 0430 12/01/18 0700  WBC 5.5   < > 6.3 14.1*   < > 4.9 5.7 5.8 7.8  NEUTROABS 2.9  --  4,082 12.6*  --   --   --   --   --   HGB 9.7*   < > 11.1* 12.3*   < > 9.1* 10.2* 9.9* 11.1*  HCT 28.5*   < > 32.8* 36.3*   < > 28.5* 32.9* 30.4* 32*  MCV 97.3  --  96.2 93.3   < > 97.3 97.6 93.3  --   PLT 144*   < > 131* 163   < > 102* 123* 137* 185   < > = values in this interval not displayed.   Cardiac Enzymes: Recent Labs    11/21/18 0851 11/21/18 1350 11/21/18 1939  TROPONINI 0.06* 0.05* 0.04*   BNP: Invalid input(s): POCBNP Lab Results  Component Value Date   HGBA1C 5.2 08/13/2018   Lab Results  Component Value Date   TSH 1.75 02/05/2017   No results found for:  VITAMINB12 No results found for: FOLATE No results found for: IRON, TIBC, FERRITIN  Imaging and Procedures obtained prior to SNF admission: Ct Abdomen Pelvis Wo Contrast  Result Date: 11/21/2018 CLINICAL DATA:  Sepsis. EXAM: CT ABDOMEN AND PELVIS WITHOUT CONTRAST TECHNIQUE: Multidetector CT imaging of the abdomen and pelvis was performed following the standard protocol without IV contrast. COMPARISON:  CT scan 09/04/2018 FINDINGS: Lower chest: The heart is upper limits normal in size for age. Coronary artery calcifications and evidence of prior TAVR. Small bilateral pleural effusions and bibasilar atelectasis or infiltrates. Hepatobiliary: No focal hepatic lesions are identified without contrast. Numerous gallstones are noted in the gallbladder. Suspect mild gallbladder wall thickening and mild pericholecystic inflammatory changes. Could not exclude acute cholecystitis. No common bile duct dilatation. Pancreas: Moderate atrophy but no mass or acute inflammation. Spleen: Normal size.  No focal lesions. Adrenals/Urinary Tract: The adrenal glands are unremarkable and stable. Bilateral renal cysts but no worrisome renal masses. No renal or obstructing ureteral calculi. There is a Foley catheter in a decompressed bladder. No bladder mass or bladder calculi. Stomach/Bowel: The stomach contains an NG tube. The duodenum, small bowel and colon are grossly normal. No acute inflammatory process, mass lesion or obstructive findings. Evidence of a prior right hemicolectomy. Sigmoid colon diverticulosis without findings for acute diverticulitis. Small amount of fluid noted in the right pericolic gutter and in the anterior pararenal space. Vascular/Lymphatic: Advanced atherosclerotic calcifications involving the aorta. 4.6 x 4.0 cm infrarenal abdominal aortic aneurysm with a left-sided saccular component. This measured a maximum of 4.5 cm on the prior study. Reproductive: The prostate gland and seminal vesicles are grossly  normal. Other: Small amount of free fluid in the pelvis but no pelvic mass or adenopathy. No inguinal mass or adenopathy. Musculoskeletal: No significant bony findings. Stable compression fracture of L1 with a vertebral plana appearance. IMPRESSION: 1. Cholelithiasis with findings suspicious for acute cholecystitis. Recommend correlation with right upper quadrant ultrasound examination. 2. Stable infrarenal abdominal aortic aneurysm with a left-sided saccular component. 3. Small bilateral pleural effusions and bibasilar atelectasis or infiltrates. Electronically Signed   By: Marijo Sanes M.D.   On: 11/21/2018 08:57   US Abdomen Complete  Result Date: 11/21/2018 CLINICAL DATA:  Acute cholecystitis an urinary tract infection. Patient is intubated. Elevated total bilirubin. EXAM: ABDOMEN ULTRASOUND COMPLETE COMPARISON:  CT on 11/21/2018 a prior CT exams including 12/07/2013 FINDINGS: Gallbladder: Gallbladder wall is irregular and thickened, measuring 7 millimeters. Dependent stones are present, measuring on the order of 1.7 centimeters or smaller. Common bile duct: Diameter: 5.4 millimeter Liver: The visualized portion of the liver is unremarkable. LEFT hepatic lobe is not well seen because of bowel gas. Portal vein is patent on color Doppler imaging with normal direction of blood flow towards the liver. IVC: Visualized portion normal. Pancreas: Obscured by bowel gas. Spleen: Obscured by bowel gas. Right Kidney: Length: 11.0 centimeters. 4.6 x 4.5 x 5.2 centimeter cyst containing internal septation. On CT exam 2015, this lesion measured 3.2 x 3.2 centimeters. No hydronephrosis. Left Kidney: Length: 12.3 centimeters. Simple cyst is 3.5 x 2.4 x 2.9 centimeters. No hydronephrosis. Abdominal aorta: Not visualized on the level of the mid abdominal aorta to the bifurcation segment 1 bowel gas. No visualized aneurysm. Other findings: Study quality is degraded by patient body habitus and intubation. Note is made of a small  RIGHT pleural effusion. IMPRESSION: 1. Irregular, thickened gallbladder wall and multiple gallstones. Presence or absence of sonographic Murphy's can not be assessed due to the patient's intubated status. Findings are suspicious for acute cholecystitis however. 2. RIGHT pleural effusion. 3. Complex cystic lesion within the UPPER pole the RIGHT kidney. Lesion has progressed since prior studies. If possible and needed, consider further characterization with outpatient contrast-enhanced CT or MRI with intravenous contrast. Electronically Signed   By: Nolon Nations M.D.   On: 11/21/2018 12:46  Ir Perc Cholecystostomy  Result Date: 11/23/2018 INDICATION: 85 year old male with a history of cholecystitis EXAM: CHOLECYSTOSTOMY MEDICATIONS: In-hospital antibiotics ANESTHESIA/SEDATION: Moderate (conscious) sedation was employed during this procedure. A total of Versed 2.0 mg and Fentanyl 0 mcg was administered intravenously. Moderate Sedation Time: 0 minutes. The patient's level of consciousness and vital signs were monitored continuously by radiology nursing throughout the procedure under my direct supervision. FLUOROSCOPY TIME:  Fluoroscopy Time: 0 minutes 24 seconds (42.6 mGy). COMPLICATIONS: None PROCEDURE: Informed written consent was obtained from the patient and the patient's family after a thorough discussion of the procedural risks, benefits and alternatives. All questions were addressed. Maximal Sterile Barrier Technique was utilized including caps, mask, sterile gowns, sterile gloves, sterile drape, hand hygiene and skin antiseptic. A timeout was performed prior to the initiation of the procedure. Ultrasound survey of the right upper quadrant was performed for planning purposes. Once the patient is prepped and draped in the usual sterile fashion, the skin and subcutaneous tissues overlying the gallbladder were generously infiltrated 1% lidocaine for local anesthesia. A coaxial needle was advanced under  ultrasound guidance through the skin subcutaneous tissues and a small segment of liver into the gallbladder lumen. With removal of the stylet, spontaneous dark bile drainage occurred. Using modified Seldinger technique, a 10 French drain was placed into the gallbladder fossa, with aspiration of the sample for the lab. Contrast injection confirmed position of the tube within the gallbladder lumen. Drainage catheter was attached to gravity drain with a suture retention placed. Patient tolerated the procedure well and remained hemodynamically stable throughout. No complications were encountered and no significant blood loss encountered. IMPRESSION: Status post image guided percutaneous cholecystostomy. Signed, Dulcy Fanny. Dellia Nims, RPVI [facility] Radiology Electronically Signed   By: Corrie Mckusick D.O.   On: 11/23/2018 08:16   Dg Chest Portable 1 View  Result Date: 11/21/2018 CLINICAL DATA:  Intubation. EXAM: PORTABLE CHEST 1 VIEW COMPARISON:  Earlier film, same date. FINDINGS: The endotracheal tube is right at the carina and should be retracted approximately 3 cm. The NG tube is coursing down the esophagus and into the stomach. Stable cardiac enlargement. Bibasilar atelectasis but no definite infiltrates or effusions. IMPRESSION: The endotracheal tube is right at the carina and should be retracted at least 3 cm. Bibasilar atelectasis but no edema or infiltrates Electronically Signed   By: Marijo Sanes M.D.   On: 11/21/2018 07:40   Dg Chest Port 1 View  Result Date: 11/21/2018 CLINICAL DATA:  Sepsis. EXAM: PORTABLE CHEST 1 VIEW COMPARISON:  09/16/2018 FINDINGS: The heart is enlarged but stable. Marked tortuosity and calcification of the thoracic aorta. Bibasilar atelectasis. No definite pleural effusions. No obvious infiltrates. IMPRESSION: Cardiac enlargement and marked tortuosity and calcification of the thoracic aorta. No definite infiltrates, effusions or  edema. Streaky basilar atelectasis. Electronically Signed   By: Marijo Sanes M.D.   On: 11/21/2018 06:21    Assessment/Plan 1. Sepsis due to Enterobacter ([place]) -completed zosyn abx and vitals remain stable, drainage decreasing, still has some RUQ tenderness -keep f/u with IR and surgery   2. Acute cholecystitis with chronic cholecystitis -now in chronic phase -s/p abx and drain which remains -monitor as high risk for recurrent acute exacerbation, but poor surgical candidate given cardiac risks -keep ID f/u and labs as ordered  3. Delirium -on dementia -cognition is grossly altered with each acute illness and takes time to recover--also has been admitted to the hospital 5 times since September of last year  4. Vascular dementia without behavioral disturbance -gradually progressing and stepwise decline with hospitalizations notable  5. Chronic systolic CHF (congestive heart failure) (HCC) -no signs of acute exacerbation--volume status stable  6. 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 Biliary drain displacement, sequela -continues with small amts of green-brown drainage at this point  8. Weakness acquired in ICU -cont PT, OT as he tolerates and as long as progress occurring given goals his wife has set for him  9.  Abnormal weight loss -due to multiple acute illnesses since mid-2019 and recurrent hospitalizations in pt with dementia and progressing debility, frailty  ACP:  Met with his wife and her goal remains for him to get strong enough to transfer safely to go home with her and caregivers.  He remains a full code. 25 mins spent in discussion  Family/ staff Communication: discussed with rehab nurse  Labs/tests ordered:  Serial labs as per ID recommendations; keep IR f/u and surgical f/u as planned  Persephanie Laatsch L.  Bettey Muraoka, D.O. [facility] [address], [place] [address] Cell Phone (Mon-Fri 8am-5pm):  [phone] On Call:  [phone] & follow prompts after 5pm & weekends Office Phone:  [phone] Office Fax:  [phone]

## 2018-12-09 DIAGNOSIS — M62561 Muscle wasting and atrophy, not elsewhere classified, right lower leg: Secondary | ICD-10-CM | POA: Diagnosis not present

## 2018-12-09 DIAGNOSIS — R278 Other lack of coordination: Secondary | ICD-10-CM | POA: Diagnosis not present

## 2018-12-09 DIAGNOSIS — M62562 Muscle wasting and atrophy, not elsewhere classified, left lower leg: Secondary | ICD-10-CM | POA: Diagnosis not present

## 2018-12-09 DIAGNOSIS — A419 Sepsis, unspecified organism: Secondary | ICD-10-CM | POA: Diagnosis not present

## 2018-12-09 DIAGNOSIS — F0150 Vascular dementia without behavioral disturbance: Secondary | ICD-10-CM | POA: Diagnosis not present

## 2018-12-09 DIAGNOSIS — R2689 Other abnormalities of gait and mobility: Secondary | ICD-10-CM | POA: Diagnosis not present

## 2018-12-10 DIAGNOSIS — M62561 Muscle wasting and atrophy, not elsewhere classified, right lower leg: Secondary | ICD-10-CM | POA: Diagnosis not present

## 2018-12-10 DIAGNOSIS — R278 Other lack of coordination: Secondary | ICD-10-CM | POA: Diagnosis not present

## 2018-12-10 DIAGNOSIS — M62562 Muscle wasting and atrophy, not elsewhere classified, left lower leg: Secondary | ICD-10-CM | POA: Diagnosis not present

## 2018-12-10 DIAGNOSIS — F0150 Vascular dementia without behavioral disturbance: Secondary | ICD-10-CM | POA: Diagnosis not present

## 2018-12-10 DIAGNOSIS — R2689 Other abnormalities of gait and mobility: Secondary | ICD-10-CM | POA: Diagnosis not present

## 2018-12-10 DIAGNOSIS — A419 Sepsis, unspecified organism: Secondary | ICD-10-CM | POA: Diagnosis not present

## 2018-12-11 DIAGNOSIS — R278 Other lack of coordination: Secondary | ICD-10-CM | POA: Diagnosis not present

## 2018-12-11 DIAGNOSIS — R2689 Other abnormalities of gait and mobility: Secondary | ICD-10-CM | POA: Diagnosis not present

## 2018-12-11 DIAGNOSIS — M62561 Muscle wasting and atrophy, not elsewhere classified, right lower leg: Secondary | ICD-10-CM | POA: Diagnosis not present

## 2018-12-11 DIAGNOSIS — M62562 Muscle wasting and atrophy, not elsewhere classified, left lower leg: Secondary | ICD-10-CM | POA: Diagnosis not present

## 2018-12-11 DIAGNOSIS — F0150 Vascular dementia without behavioral disturbance: Secondary | ICD-10-CM | POA: Diagnosis not present

## 2018-12-11 DIAGNOSIS — A419 Sepsis, unspecified organism: Secondary | ICD-10-CM | POA: Diagnosis not present

## 2018-12-12 ENCOUNTER — Emergency Department (HOSPITAL_COMMUNITY): Payer: Medicare Other

## 2018-12-12 ENCOUNTER — Encounter (HOSPITAL_COMMUNITY): Payer: Self-pay | Admitting: Emergency Medicine

## 2018-12-12 ENCOUNTER — Emergency Department (HOSPITAL_COMMUNITY)
Admission: EM | Admit: 2018-12-12 | Discharge: 2018-12-12 | Payer: Medicare Other | Attending: Emergency Medicine | Admitting: Emergency Medicine

## 2018-12-12 DIAGNOSIS — I5023 Acute on chronic systolic (congestive) heart failure: Secondary | ICD-10-CM | POA: Insufficient documentation

## 2018-12-12 DIAGNOSIS — N390 Urinary tract infection, site not specified: Secondary | ICD-10-CM | POA: Insufficient documentation

## 2018-12-12 DIAGNOSIS — Z7982 Long term (current) use of aspirin: Secondary | ICD-10-CM | POA: Diagnosis not present

## 2018-12-12 DIAGNOSIS — E860 Dehydration: Secondary | ICD-10-CM | POA: Insufficient documentation

## 2018-12-12 DIAGNOSIS — I2510 Atherosclerotic heart disease of native coronary artery without angina pectoris: Secondary | ICD-10-CM | POA: Diagnosis not present

## 2018-12-12 DIAGNOSIS — F0390 Unspecified dementia without behavioral disturbance: Secondary | ICD-10-CM | POA: Diagnosis not present

## 2018-12-12 DIAGNOSIS — R0902 Hypoxemia: Secondary | ICD-10-CM | POA: Diagnosis not present

## 2018-12-12 DIAGNOSIS — M2550 Pain in unspecified joint: Secondary | ICD-10-CM | POA: Diagnosis not present

## 2018-12-12 DIAGNOSIS — Z79899 Other long term (current) drug therapy: Secondary | ICD-10-CM | POA: Diagnosis not present

## 2018-12-12 DIAGNOSIS — Z8546 Personal history of malignant neoplasm of prostate: Secondary | ICD-10-CM | POA: Diagnosis not present

## 2018-12-12 DIAGNOSIS — Z95810 Presence of automatic (implantable) cardiac defibrillator: Secondary | ICD-10-CM | POA: Insufficient documentation

## 2018-12-12 DIAGNOSIS — R531 Weakness: Secondary | ICD-10-CM | POA: Diagnosis not present

## 2018-12-12 DIAGNOSIS — R400 Somnolence: Secondary | ICD-10-CM | POA: Diagnosis not present

## 2018-12-12 DIAGNOSIS — R5383 Other fatigue: Secondary | ICD-10-CM | POA: Insufficient documentation

## 2018-12-12 DIAGNOSIS — Z7902 Long term (current) use of antithrombotics/antiplatelets: Secondary | ICD-10-CM | POA: Diagnosis not present

## 2018-12-12 DIAGNOSIS — J45909 Unspecified asthma, uncomplicated: Secondary | ICD-10-CM | POA: Diagnosis not present

## 2018-12-12 DIAGNOSIS — R4182 Altered mental status, unspecified: Secondary | ICD-10-CM | POA: Diagnosis present

## 2018-12-12 DIAGNOSIS — Z7401 Bed confinement status: Secondary | ICD-10-CM | POA: Diagnosis not present

## 2018-12-12 DIAGNOSIS — I509 Heart failure, unspecified: Secondary | ICD-10-CM | POA: Diagnosis not present

## 2018-12-12 DIAGNOSIS — R000 Tachycardia, unspecified: Secondary | ICD-10-CM | POA: Diagnosis not present

## 2018-12-12 LAB — URINALYSIS, ROUTINE W REFLEX MICROSCOPIC
Bacteria, UA: NONE SEEN
Bilirubin Urine: NEGATIVE
Glucose, UA: NEGATIVE mg/dL
Ketones, ur: NEGATIVE mg/dL
Nitrite: NEGATIVE
PROTEIN: NEGATIVE mg/dL
SPECIFIC GRAVITY, URINE: 1.017 (ref 1.005–1.030)
pH: 5 (ref 5.0–8.0)

## 2018-12-12 LAB — LACTIC ACID, PLASMA
Lactic Acid, Venous: 2 mmol/L (ref 0.5–1.9)
Lactic Acid, Venous: 1.4 mmol/L (ref 0.5–1.9)

## 2018-12-12 LAB — COMPREHENSIVE METABOLIC PANEL
ALT: 25 U/L (ref 0–44)
AST: 24 U/L (ref 15–41)
Albumin: 2.7 g/dL — ABNORMAL LOW (ref 3.5–5.0)
Alkaline Phosphatase: 104 U/L (ref 38–126)
Anion gap: 12 (ref 5–15)
BUN: 18 mg/dL (ref 8–23)
CHLORIDE: 99 mmol/L (ref 98–111)
CO2: 23 mmol/L (ref 22–32)
Calcium: 8.9 mg/dL (ref 8.9–10.3)
Creatinine, Ser: 0.94 mg/dL (ref 0.61–1.24)
GFR calc Af Amer: >60 mL/min (ref >60)
GFR calc non Af Amer: >60 mL/min (ref >60)
Glucose, Bld: 160 mg/dL — ABNORMAL HIGH (ref 70–99)
Potassium: 3.9 mmol/L (ref 3.5–5.1)
Sodium: 134 mmol/L — ABNORMAL LOW (ref 135–145)
Total Bilirubin: 0.8 mg/dL (ref 0.3–1.2)
Total Protein: 7.4 g/dL (ref 6.5–8.1)

## 2018-12-12 LAB — CBC WITH DIFFERENTIAL/PLATELET
Abs Immature Granulocytes: 0.07 10*3/uL (ref 0.00–0.07)
BASOS PCT: 0 %
Basophils Absolute: 0 10*3/uL (ref 0.0–0.1)
Eosinophils Absolute: 0.8 10*3/uL — ABNORMAL HIGH (ref 0.0–0.5)
Eosinophils Relative: 9 %
HCT: 37.7 % — ABNORMAL LOW (ref 39.0–52.0)
Hemoglobin: 12.5 g/dL — ABNORMAL LOW (ref 13.0–17.0)
IMMATURE GRANULOCYTES: 1 %
Lymphocytes Relative: 15 %
Lymphs Abs: 1.3 10*3/uL (ref 0.7–4.0)
MCH: 30.3 pg (ref 26.0–34.0)
MCHC: 33.2 g/dL (ref 30.0–36.0)
MCV: 91.5 fL (ref 80.0–100.0)
Monocytes Absolute: 0.7 10*3/uL (ref 0.1–1.0)
Monocytes Relative: 9 %
NEUTROS PCT: 66 %
Neutro Abs: 5.4 10*3/uL (ref 1.7–7.7)
PLATELETS: 166 10*3/uL (ref 150–400)
RBC: 4.12 MIL/uL — ABNORMAL LOW (ref 4.22–5.81)
RDW: 15.5 % (ref 11.5–15.5)
WBC: 8.3 10*3/uL (ref 4.0–10.5)
nRBC: 0 % (ref 0.0–0.2)

## 2018-12-12 LAB — LIPASE, BLOOD: Lipase: 24 U/L (ref 11–51)

## 2018-12-12 LAB — AMMONIA: Ammonia: 9 umol/L (ref 9–35)

## 2018-12-12 MED ORDER — SULFAMETHOXAZOLE-TRIMETHOPRIM 800-160 MG PO TABS
1.0000 | ORAL_TABLET | Freq: Once | ORAL | Status: AC
  Administered 2018-12-12: 1 via ORAL
  Filled 2018-12-12: qty 1

## 2018-12-12 MED ORDER — SULFAMETHOXAZOLE-TRIMETHOPRIM 800-160 MG PO TABS: 1.0000 | ORAL_TABLET | Freq: Two times a day (BID) | ORAL | 0 refills | Status: DC

## 2018-12-12 MED ORDER — SODIUM CHLORIDE 0.9 % IV BOLUS
1000.0000 mL | Freq: Once | INTRAVENOUS | Status: AC
  Administered 2018-12-12: 1000 mL via INTRAVENOUS

## 2018-12-12 NOTE — ED Notes (Signed)
Report called to [facility]

## 2018-12-12 NOTE — ED Notes (Signed)
Patient transported to CT 

## 2018-12-12 NOTE — ED Notes (Signed)
Patient discharged yo PTAR to [facility]. Patient's discharge papers sent with patient to [facility]. Patient stable.

## 2018-12-12 NOTE — ED Notes (Signed)
RN attempted to call report to [facility].

## 2018-12-12 NOTE — Discharge Instructions (Signed)
You were seen in the emergency department for being slightly less active and slightly tachycardic today.  You had blood work chest x-ray CAT scan of your head and a urinalysis.  The only significant finding was some signs of dehydration and a possible urine infection.  We sent off a urine culture and blood cultures which will need to be followed up.  We are starting you on antibiotics for possible infection.  Please follow-up with your doctor and return to the hospital if any fever or worsening symptoms.

## 2018-12-12 NOTE — ED Notes (Signed)
PTAR called for pt 

## 2018-12-12 NOTE — ED Provider Notes (Signed)
[facility] EMERGENCY DEPARTMENT Provider Note   CSN: [id number] Arrival date & time: 12/12/18  1518    History   Chief Complaint Chief Complaint  Patient presents with  . Altered Mental Status    HPI William [name] is a 85 y.o. male.  Level 5 caveat secondary to dementia.  85 year old male brought in by EMS from [facility] for evaluation of change in behavior today.  He said usually he is more conversant and will feed himself but was less today.  Patient himself denies any complaints.  He is a complicated recent history with sepsis respiratory failure with intubation.     The history is provided by the patient and the EMS personnel.  Altered Mental Status  Presenting symptoms: behavior changes   Severity:  Unable to specify Most recent episode:  Today Episode history:  Unable to specify Timing:  Constant Progression:  Unchanged Chronicity:  New Context: dementia, nursing home resident, recent illness and recent infection     Past Medical History:  Diagnosis Date  . AAA (abdominal aortic aneurysm) ([place])    7/13 3.8cm  . Arthritis    "joints" (01/11/2014)  . Asthma    "seasonal; some foods"   . Chronic systolic CHF (congestive heart failure) ([place])   . Dementia ([place])   . HLD (hyperlipidemia)   . HOH (hard of hearing)   . Lumbar vertebral fracture (HCC)    L1- 04/11/2014   . Prostate cancer ([place])   . S/P ICD (internal cardiac defibrillator) procedure, 01/11/14 removal of ERI gen and placement of Medtronic Evera XT VR & NEW Right ventricular lead Medtronic 01/12/2014  . S/P TAVR (transcatheter aortic valve replacement)    Edwards Sapien 3 THV (size 29 mm, model # [id number], serial # [id number])  . Severe aortic stenosis   . Sleep apnea    "lost 60# & don't have it anymore" (01/11/2014)  . Ventricular tachycardia [facility])     Patient Active Problem List   Diagnosis Date Noted  . Acute cholecystitis   . Cholangitis 11/25/2018  . Bacteremia due to  Enterobacter species 11/24/2018  . Enterococcus faecalis infection 11/24/2018  . Pressure injury of skin 11/22/2018  . Shock ([place]) 11/21/2018  . Coronary artery disease involving native coronary artery of native heart without angina pectoris 09/22/2018  . Closed fracture of lower end of right ulna 09/22/2018  . Weakness acquired in ICU 09/22/2018  . Acute bacterial endocarditis   . 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 Bacteremia due to Klebsiella pneumoniae   . Sepsis ([place]) 09/04/2018  . S/P TAVR (transcatheter aortic valve replacement) 08/18/2018  . Ventricular tachycardia ([place])   . Dementia ([place])   . Acute respiratory failure ([place])   . Chronic systolic CHF (congestive heart failure) ([place]) 08/07/2018  . Severe aortic stenosis   . Acute on chronic systolic heart failure ([place]) 07/02/2018  . Prostate cancer ([place]) 01/20/2014  . S/P ICD (internal cardiac defibrillator) procedure, 01/11/14 removal of ERI gen and placement of Medtronic Evera XT VR & NEW Right ventricular lead Medtronic 01/12/2014  . Nonischemic cardiomyopathy ([place]) 05/28/2013  . Hyperlipidemia 05/28/2013  . AAA (abdominal aortic aneurysm) ([place]) 05/28/2013    Past Surgical History:  Procedure Laterality Date  . CARDIAC CATHETERIZATION  08/20/2004   noncritical CAD,mild global hypokinesis, EF 50%  . CARDIAC DEFIBRILLATOR PLACEMENT  08/23/2004   Medtronic  . CATARACT EXTRACTION W/ INTRAOCULAR LENS IMPLANT Right   . COLECTOMY  1990's  . CRYOABLATION N/A 02/24/2014   Procedure:  CRYO ABLATION PROSTATE;  Surgeon: Ailene Rud, MD;  Location: [facility] ORS;  Service: Urology;  Laterality: N/A;  . HERNIA REPAIR     "abdomen; from colon OR"  . ICD LEAD REMOVAL N/A 09/15/2018   Procedure: ICD LEAD REMOVAL EXTRACTION;  Surgeon: Evans Lance, MD;  Location: [facility];  Service: Cardiovascular;  Laterality: N/A;  DR. BARTLE TO BACK UP  . IMPLANTABLE CARDIOVERTER DEFIBRILLATOR (ICD) GENERATOR CHANGE N/A 01/11/2014   Procedure: ICD  GENERATOR CHANGE;  Surgeon: Sanda Klein, MD;  Location: [facility] CATH LAB;  Service: Cardiovascular;  Laterality: N/A;  . IR PERC CHOLECYSTOSTOMY  11/22/2018  . LEAD REVISION N/A 01/11/2014   Procedure: LEAD REVISION;  Surgeon: Sanda Klein, MD;  Location: [facility] CATH LAB;  Service: Cardiovascular;  Laterality: N/A;  . NM MYOCAR PERF WALL MOTION  01/29/2012   abnormal c/o infarct/scar,no ischemia present  . PROSTATE BIOPSY N/A 11/22/2013   Procedure: PROSTATE BIOPSY AND ULTRASOUND;  Surgeon: Ailene Rud, MD;  Location: [facility] ORS;  Service: Urology;  Laterality: N/A;  . RIGHT/LEFT HEART CATH AND CORONARY ANGIOGRAPHY N/A 07/06/2018   Procedure: RIGHT/LEFT HEART CATH AND CORONARY ANGIOGRAPHY;  Surgeon: Troy Sine, MD;  Location: [facility] CV LAB;  Service: Cardiovascular;  Laterality: N/A;  . TEE WITHOUT CARDIOVERSION N/A 08/18/2018   Procedure: TRANSESOPHAGEAL ECHOCARDIOGRAM (TEE);  Surgeon: Burnell Blanks, MD;  Location: [facility];  Service: Open Heart Surgery;  Laterality: N/A;  . TEE WITHOUT CARDIOVERSION N/A 09/08/2018   Procedure: TRANSESOPHAGEAL ECHOCARDIOGRAM (TEE);  Surgeon: Lelon Perla, MD;  Location: [facility] ENDOSCOPY;  Service: Cardiovascular;  Laterality: N/A;  . TEE WITHOUT CARDIOVERSION N/A 09/15/2018   Procedure: TRANSESOPHAGEAL ECHOCARDIOGRAM (TEE);  Surgeon: Evans Lance, MD;  Location: [facility];  Service: Cardiovascular;  Laterality: N/A;  . TEE WITHOUT CARDIOVERSION N/A 11/26/2018   Procedure: TRANSESOPHAGEAL ECHOCARDIOGRAM (TEE);  Surgeon: Jerline Pain, MD;  Location: [facility] ENDOSCOPY;  Service: Cardiovascular;  Laterality: N/A;  . TRANSCATHETER AORTIC VALVE REPLACEMENT, TRANSFEMORAL  08/18/2018  . TRANSCATHETER AORTIC VALVE REPLACEMENT, TRANSFEMORAL N/A 08/18/2018   Procedure: TRANSCATHETER AORTIC VALVE REPLACEMENT, TRANSFEMORAL using an Edwards 14mm Aortic Valve;  Surgeon: Burnell Blanks, MD;  Location: [facility];  Service: Open Heart Surgery;  Laterality: N/A;         Home Medications    Prior to Admission medications   Medication Sig Start Date End Date Taking? Authorizing Provider  acetaminophen (TYLENOL) 500 MG tablet Take 500 mg by mouth every 6 (six) hours as needed (for pain).     [provider]  amiodarone (PACERONE) 200 MG tablet Take 0.5 tablets (100 mg total) by mouth daily. 11/16/18   Croitoru, Mihai, MD  aspirin 81 MG chewable tablet Chew 1 tablet (81 mg total) by mouth daily. 07/08/18   Kroeger, Lorelee Cover., PA-C  bisacodyl (DULCOLAX) 10 MG suppository Place 1 suppository (10 mg total) rectally daily as needed for moderate constipation. 11/27/18   Ghimire, Henreitta Leber, MD  cholecalciferol (VITAMIN D) 1000 units tablet Take 2,000 Units by mouth at bedtime.    [provider]  clopidogrel (PLAVIX) 75 MG tablet Take 1 tablet (75 mg total) by mouth daily with breakfast. 08/21/18   Eileen Stanford, PA-C  metoprolol succinate (TOPROL-XL) 25 MG 24 hr tablet Take 1 tablet (25 mg total) by mouth daily. Take with or immediately following a meal. 09/18/18   Nita Sells, MD  Multiple Vitamins-Minerals (PRESERVISION AREDS 2 PO) Take 1 capsule by mouth daily.     [provider]  polyethylene glycol (MIRALAX / GLYCOLAX) packet Take 17 g by mouth every evening. 01/21/17   Croitoru, Dani Gobble, MD    Family History Family History  Problem Relation Age of Onset  . Heart failure Mother        Died of "old age" at 32  . Pneumonia Father     Social History Social History   Tobacco Use  . Smoking status: Never Smoker  . Smokeless tobacco: Never Used  Substance Use Topics  . Alcohol use: Yes    Alcohol/week: 0.0 standard drinks    Comment: 01/11/2014 "drink 1/2 of a beer twice per month   . Drug use: No     Allergies   Crestor [rosuvastatin]; Lipitor [atorvastatin]; and Shrimp [shellfish allergy]   Review of Systems Review of Systems  Unable to perform ROS: Dementia     Physical Exam Updated Vital  Signs BP (!) 112/92   Pulse 100   Resp 18   SpO2 98%   Physical Exam Vitals signs and nursing note reviewed.  Constitutional:      Appearance: He is well-developed.  HENT:     Head: Normocephalic and atraumatic.  Eyes:     Conjunctiva/sclera: Conjunctivae normal.  Neck:     Musculoskeletal: Neck supple.  Cardiovascular:     Rate and Rhythm: Regular rhythm. Tachycardia present.     Pulses: Normal pulses.     Heart sounds: No murmur.  Pulmonary:     Effort: Pulmonary effort is normal. No respiratory distress.     Breath sounds: Normal breath sounds.  Abdominal:     Palpations: Abdomen is soft.     Tenderness: There is no abdominal tenderness.     Comments: He has a drain in his right upper quadrant with some thin yellow fluid.  Musculoskeletal: Normal range of motion.        General: No deformity or signs of injury.     Right lower leg: Edema present.     Left lower leg: Edema present.  Skin:    General: Skin is warm and dry.     Capillary Refill: Capillary refill takes less than 2 seconds.  Neurological:     General: No focal deficit present.     Mental Status: He is alert. Mental status is at baseline. He is disoriented.     Comments: Patient is awake and alert.  He will follow commands.  He is moving all extremities.  No obvious facial droop or dysarthria.  He is oriented to person only.      ED Treatments / Results  Labs (all labs ordered are listed, but only abnormal results are displayed) Labs Reviewed  CBC WITH DIFFERENTIAL/PLATELET - Abnormal; Notable for the following components:      Result Value   RBC 4.12 (*)    Hemoglobin 12.5 (*)    HCT 37.7 (*)    Eosinophils Absolute 0.8 (*)    All other components within normal limits  COMPREHENSIVE METABOLIC PANEL - Abnormal; Notable for the following components:   Sodium 134 (*)    Glucose, Bld 160 (*)    Albumin 2.7 (*)    All other components within normal limits  URINALYSIS, ROUTINE W REFLEX MICROSCOPIC -  Abnormal; Notable for the following components:   APPearance HAZY (*)    Hgb urine dipstick MODERATE (*)    Leukocytes,Ua MODERATE (*)    WBC, UA >50 (*)    All other components within normal limits  LACTIC ACID, PLASMA -  Abnormal; Notable for the following components:   Lactic Acid, Venous 2.0 (*)    All other components within normal limits  CULTURE, BLOOD (ROUTINE X 2)  CULTURE, BLOOD (ROUTINE X 2)  URINE CULTURE  AMMONIA  LACTIC ACID, PLASMA  LIPASE, BLOOD    EKG None  Radiology Ct Head Wo Contrast  Result Date: 12/12/2018 CLINICAL DATA:  Altered level of consciousness.  Unexplained. EXAM: CT HEAD WITHOUT CONTRAST TECHNIQUE: Contiguous axial images were obtained from the base of the skull through the vertex without intravenous contrast. COMPARISON:  10/09/2018 FINDINGS: Brain: No evidence of acute infarction, hemorrhage, hydrocephalus, extra-axial collection or mass lesion/mass effect. There is mild diffuse low-attenuation within the subcortical and periventricular white matter compatible with chronic microvascular disease. Prominence of the sulci and ventricles are identified compatible with brain atrophy. Vascular: No hyperdense vessel or unexpected calcification. Skull: Normal. Negative for fracture or focal lesion. Sinuses/Orbits: No acute finding. Other: None IMPRESSION: 1. No acute intracranial abnormality. 2. Chronic small vessel ischemic change and brain atrophy. Electronically Signed   By: Kerby Moors M.D.   On: 12/12/2018 17:22   Dg Chest Port 1 View  Result Date: 12/12/2018 CLINICAL DATA:  ALOC today. Hx of dementia, AAA, s/p TAVR, CHF, asthma. EXAM: PORTABLE CHEST 1 VIEW COMPARISON:  11/23/2018 FINDINGS: The heart is enlarged. Status post TAVR. The aorta is partially calcified. Shallow lung inflation. Minimal bilateral LOWER lobe atelectasis or scarring. The lungs are free of focal consolidations. No pulmonary edema. IMPRESSION: 1. Cardiomegaly without pulmonary edema.  2. Bilateral LOWER lobe atelectasis or scarring. 3.  Aortic atherosclerosis.  (ICD10-I70.0) Electronically Signed   By: Nolon Nations M.D.   On: 12/12/2018 16:03    Procedures Procedures (including critical care time)  Medications Ordered in ED Medications  sodium chloride 0.9 % bolus 1,000 mL (0 mLs Intravenous Stopped 12/12/18 1759)  sulfamethoxazole-trimethoprim (BACTRIM DS,SEPTRA DS) 800-160 MG per tablet 1 tablet (1 tablet Oral Given 12/12/18 1935)     Initial Impression / Assessment and Plan / ED Course  I have reviewed the triage vital signs and the nursing notes.  Pertinent labs & imaging results that were available during my care of the patient were reviewed by me and considered in my medical decision making (see chart for details).  Clinical Course as of Dec 13 1410  Sat Dec 12, 2018  1532 Patient with recent AICD infection and removal, late January admitted for respiratory failure intubation and acute cholecystitis managed medically and IR drainage.  It sounds like currently is off antibiotics.  He is here with an episode of behavior change where he is less talkative and does not doing his usual ADLs.  Patient himself denies any complaints.  Is getting some screening labs chest x-ray EKG.   [MB]  1610 Patient's wife is here now and she says he seems pretty much baseline.  He has noticed a little bit of drainage at the drain site.  No reported fevers.  She says sometimes he does not feed himself so that is not unusual.  Possibly is a little less interactive.   [MB]  9604 Patient's wife said he looks a lot better after receiving some IV fluids.  His lactate was just slightly elevated and we are repeating it but hopefully was just more a function of dehydration.  He was also able to give a urine.   [MB]    Clinical Course User Index [MB] Hayden Rasmussen, MD        Final Clinical  Impressions(s) / ED Diagnoses   Final diagnoses:  Lethargy  Dehydration  Lower urinary  tract infectious disease    ED Discharge Orders         Ordered    sulfamethoxazole-trimethoprim (BACTRIM DS,SEPTRA DS) 800-160 MG tablet  2 times daily     12/12/18 1907           Hayden Rasmussen, MD 12/13/18 1413

## 2018-12-12 NOTE — ED Triage Notes (Signed)
Pt here from [facility] with aloc , per staff pt has dementia but will normally talk and feed himself , pt has hx of sepsis

## 2018-12-13 LAB — URINE CULTURE: Culture: <10000 — AB

## 2018-12-14 ENCOUNTER — Encounter: Payer: Self-pay | Admitting: Adult Health

## 2018-12-14 ENCOUNTER — Non-Acute Institutional Stay (SKILLED_NURSING_FACILITY): Payer: Medicare Other | Admitting: Adult Health

## 2018-12-14 DIAGNOSIS — R278 Other lack of coordination: Secondary | ICD-10-CM | POA: Diagnosis not present

## 2018-12-14 DIAGNOSIS — G9341 Metabolic encephalopathy: Secondary | ICD-10-CM | POA: Diagnosis not present

## 2018-12-14 DIAGNOSIS — F0150 Vascular dementia without behavioral disturbance: Secondary | ICD-10-CM | POA: Diagnosis not present

## 2018-12-14 DIAGNOSIS — J9601 Acute respiratory failure with hypoxia: Secondary | ICD-10-CM | POA: Diagnosis not present

## 2018-12-14 DIAGNOSIS — I5022 Chronic systolic (congestive) heart failure: Secondary | ICD-10-CM | POA: Diagnosis not present

## 2018-12-14 DIAGNOSIS — K819 Cholecystitis, unspecified: Secondary | ICD-10-CM | POA: Diagnosis not present

## 2018-12-14 DIAGNOSIS — R2689 Other abnormalities of gait and mobility: Secondary | ICD-10-CM | POA: Diagnosis not present

## 2018-12-14 DIAGNOSIS — A419 Sepsis, unspecified organism: Secondary | ICD-10-CM | POA: Diagnosis not present

## 2018-12-14 DIAGNOSIS — R000 Tachycardia, unspecified: Secondary | ICD-10-CM

## 2018-12-14 DIAGNOSIS — M62561 Muscle wasting and atrophy, not elsewhere classified, right lower leg: Secondary | ICD-10-CM | POA: Diagnosis not present

## 2018-12-14 DIAGNOSIS — M62562 Muscle wasting and atrophy, not elsewhere classified, left lower leg: Secondary | ICD-10-CM | POA: Diagnosis not present

## 2018-12-14 DIAGNOSIS — I714 Abdominal aortic aneurysm, without rupture: Secondary | ICD-10-CM | POA: Diagnosis not present

## 2018-12-14 DIAGNOSIS — Z952 Presence of prosthetic heart valve: Secondary | ICD-10-CM | POA: Diagnosis not present

## 2018-12-14 NOTE — Progress Notes (Signed)
Location:  Occupational psychologist of Service:  SNF (31) Provider:   Cindi Carbon, [place] [phone]   Tisovec, Fransico Him, MD  Patient Care Team: Tisovec, Fransico Him, MD as PCP - General (Internal Medicine) Sanda Klein, MD as PCP - Cardiology (Cardiology) Carolan Clines, MD (Inactive) as Consulting Physician (Urology)  Extended Emergency Contact Information Primary Emergency Contact: [facility] Address: [address]          [place], [place] [address] [name] of [place] Phone: [phone] Mobile Phone: [phone] Relation: Spouse Secondary Emergency Contact: Michail Sermon, [place] of [place] Phone: [phone] Mobile Phone: [phone] Relation: Daughter  Code Status:  Full code Goals of care: Advanced Directive information Advanced Directives 12/08/2018  Does Patient Have a Medical Advance Directive? No  Type of Advance Directive -  Does patient want to make changes to medical advance directive? -  Copy of [place] in Chart? -  Would patient like information on creating a medical advance directive? No - Patient declined  Pre-existing out of facility DNR order (yellow form or pink MOST form) -     Chief Complaint  Patient presents with  . Acute Visit    tachycardia    HPI:  Pt is a 85 y.o. male seen today for an acute visit for tachycardia. The nurse reported that this morning his pulse was 120 and his RR was 24. He felt weak and did not participate in physical therapy. An EKG was obtained which showed Sinus tach of 102 with PVCs.  He is not having any sob, cp, doe, weight gain, increased edema, fever, abd pain, nausea, vomiting, diarrhea, or dysuria.  He is currently here in the [facility] rehab after a hospitalization on 09/04/18-09/21/18 due to sepsis due to an infected defibrillator.  Blood cultures returned with ESBL (klebsiella) bacteremia.   TEE on 11/26 showed possible vegetation of pacemaker lead and endocarditis but no infection of aortic valve replacement was identified.  ICD system with debrillator lead was removed 09/15/18. He received Invanz for 6 weeks and completed therapy on 10/27/2018.   He was also septic due to acute cholecystitis and was sent to the hospital and treated with Zosyn until 2/22 . A bile drain was placed and has been draining 20-50 cc per day. The drainage was originally brown but is now a purulent thick yellow color.   On 2/29 he was sent to the ER due to tachycardia and lethargy and diagnosed with dehydration and a UTI.  A CXR and blood cultures were done which were negative. A CT of the head was obtained which showed no acute findings but global atrophy and small vessel disease. A urine was obtained which had a moderate amt of Hgb and a moderate amt of WBCs.  This urine was obtained in a urinal. He was placed on Bactrim on 2/29. He was given a liter of fluid for presumed dehydration and his lactic acid reduced from 2.0 to 1.4.  The final urine culture came back today which showed insignificant growth.    Past Medical History:  Diagnosis Date  . AAA (abdominal aortic aneurysm) ([facility])    7/13 3.8cm  . Arthritis    "joints" (01/11/2014)  . Asthma    "seasonal; some foods"   . Chronic systolic CHF (congestive heart failure) ([facility])   . Dementia ([place])   . HLD (hyperlipidemia)   . HOH (hard of  hearing)   . Lumbar vertebral fracture ([facility])    L1- 04/11/2014   . Prostate cancer ([place])   . S/P ICD (internal cardiac defibrillator) procedure, 01/11/14 removal of ERI gen and placement of Medtronic Evera XT VR & NEW Right ventricular lead Medtronic 01/12/2014  . S/P TAVR (transcatheter aortic valve replacement)    Edwards Sapien 3 THV (size 29 mm, model # [id number], serial # [id number])  . Severe aortic stenosis   . Sleep apnea    "lost 60# & don't have it anymore" (01/11/2014)  . Ventricular tachycardia [facility])    Past  Surgical History:  Procedure Laterality Date  . CARDIAC CATHETERIZATION  08/20/2004   noncritical CAD,mild global hypokinesis, EF 50%  . CARDIAC DEFIBRILLATOR PLACEMENT  08/23/2004   Medtronic  . CATARACT EXTRACTION W/ INTRAOCULAR LENS IMPLANT Right   . COLECTOMY  1990's  . CRYOABLATION N/A 02/24/2014   Procedure: CRYO ABLATION PROSTATE;  Surgeon: Ailene Rud, MD;  Location: [facility] ORS;  Service: Urology;  Laterality: N/A;  . HERNIA REPAIR     "abdomen; from colon OR"  . ICD LEAD REMOVAL N/A 09/15/2018   Procedure: ICD LEAD REMOVAL EXTRACTION;  Surgeon: Evans Lance, MD;  Location: [facility] OR;  Service: Cardiovascular;  Laterality: N/A;  DR. BARTLE TO BACK UP  . IMPLANTABLE CARDIOVERTER DEFIBRILLATOR (ICD) GENERATOR CHANGE N/A 01/11/2014   Procedure: ICD GENERATOR CHANGE;  Surgeon: Sanda Klein, MD;  Location: [facility] CATH LAB;  Service: Cardiovascular;  Laterality: N/A;  . IR PERC CHOLECYSTOSTOMY  11/22/2018  . LEAD REVISION N/A 01/11/2014   Procedure: LEAD REVISION;  Surgeon: Sanda Klein, MD;  Location: [facility] CATH LAB;  Service: Cardiovascular;  Laterality: N/A;  . NM MYOCAR PERF WALL MOTION  01/29/2012   abnormal c/o infarct/scar,no ischemia present  . PROSTATE BIOPSY N/A 11/22/2013   Procedure: PROSTATE BIOPSY AND ULTRASOUND;  Surgeon: Ailene Rud, MD;  Location: [facility];  Service: Urology;  Laterality: N/A;  . RIGHT/LEFT HEART CATH AND CORONARY ANGIOGRAPHY N/A 07/06/2018   Procedure: RIGHT/LEFT HEART CATH AND CORONARY ANGIOGRAPHY;  Surgeon: Troy Sine, MD;  Location: [facility] CV LAB;  Service: Cardiovascular;  Laterality: N/A;  . TEE WITHOUT CARDIOVERSION N/A 08/18/2018   Procedure: TRANSESOPHAGEAL ECHOCARDIOGRAM (TEE);  Surgeon: Burnell Blanks, MD;  Location: [facility];  Service: Open Heart Surgery;  Laterality: N/A;  . TEE WITHOUT CARDIOVERSION N/A 09/08/2018   Procedure: TRANSESOPHAGEAL ECHOCARDIOGRAM (TEE);  Surgeon: Lelon Perla, MD;  Location: [facility] ENDOSCOPY;   Service: Cardiovascular;  Laterality: N/A;  . TEE WITHOUT CARDIOVERSION N/A 09/15/2018   Procedure: TRANSESOPHAGEAL ECHOCARDIOGRAM (TEE);  Surgeon: Evans Lance, MD;  Location: [facility] OR;  Service: Cardiovascular;  Laterality: N/A;  . TEE WITHOUT CARDIOVERSION N/A 11/26/2018   Procedure: TRANSESOPHAGEAL ECHOCARDIOGRAM (TEE);  Surgeon: Jerline Pain, MD;  Location: [facility] ENDOSCOPY;  Service: Cardiovascular;  Laterality: N/A;  . TRANSCATHETER AORTIC VALVE REPLACEMENT, TRANSFEMORAL  08/18/2018  . TRANSCATHETER AORTIC VALVE REPLACEMENT, TRANSFEMORAL N/A 08/18/2018   Procedure: TRANSCATHETER AORTIC VALVE REPLACEMENT, TRANSFEMORAL using an Edwards 39m Aortic Valve;  Surgeon: MBurnell Blanks MD;  Location: [facility]  Service: Open Heart Surgery;  Laterality: N/A;    Allergies  Allergen Reactions  . Crestor [Rosuvastatin] Other (See Comments)    Hurts muscles  . Lipitor [Atorvastatin] Other (See Comments)    Hurts stomach  . Shrimp [Shellfish Allergy] Other (See Comments)    On MAR    Outpatient Encounter Medications as of 12/14/2018  Medication Sig  . acetaminophen (TYLENOL) 500  MG tablet Take 500 mg by mouth every 6 (six) hours as needed (for pain).   Marland Kitchen amiodarone (PACERONE) 200 MG tablet Take 0.5 tablets (100 mg total) by mouth daily.  Marland Kitchen aspirin 81 MG chewable tablet Chew 1 tablet (81 mg total) by mouth daily.  . bisacodyl (DULCOLAX) 10 MG suppository Place 1 suppository (10 mg total) rectally daily as needed for moderate constipation.  . cholecalciferol (VITAMIN D) 1000 units tablet Take 2,000 Units by mouth at bedtime.  . clopidogrel (PLAVIX) 75 MG tablet Take 1 tablet (75 mg total) by mouth daily with breakfast.  . metoprolol succinate (TOPROL-XL) 25 MG 24 hr tablet Take 1 tablet (25 mg total) by mouth daily. Take with or immediately following a meal.  . Multiple Vitamins-Minerals (PRESERVISION AREDS 2 PO) Take 1 capsule by mouth daily.   . polyethylene glycol (MIRALAX / GLYCOLAX) packet  Take 17 g by mouth every evening.  . sulfamethoxazole-trimethoprim (BACTRIM DS,SEPTRA DS) 800-160 MG tablet Take 1 tablet by mouth 2 (two) times daily for 7 days.   No facility-administered encounter medications on file as of 12/14/2018.     Review of Systems  Unable to perform ROS: Dementia    Immunization History  Administered Date(s) Administered  . Influenza, High Dose Seasonal PF 08/13/2017, 07/03/2018  . Influenza,inj,Quad PF,6+ Mos 08/18/2013, 07/23/2016  . Pneumococcal Conjugate-13 08/14/2013  . Pneumococcal Polysaccharide-23 04/06/2009   Pertinent  Health Maintenance Due  Topic Date Due  . INFLUENZA VACCINE  Completed  . PNA vac Low Risk Adult  Completed   Fall Risk  12/03/2018  Falls in the past year? 0   Functional Status Survey:    Vitals:   12/14/18 1551  BP: 119/79  Pulse: (!) 102  Resp: (!) 22  Temp: 98.4 F (36.9 C)  SpO2: 96%   There is no height or weight on file to calculate BMI. Physical Exam Vitals signs and nursing note reviewed.  Constitutional:      General: He is not in acute distress.    Appearance: He is not diaphoretic.  HENT:     Head: Normocephalic and atraumatic.     Right Ear: Tympanic membrane normal.     Left Ear: Tympanic membrane normal.     Nose: No congestion or rhinorrhea.     Mouth/Throat:     Mouth: Mucous membranes are moist.     Pharynx: Oropharynx is clear. No oropharyngeal exudate or posterior oropharyngeal erythema.  Eyes:     General:        Right eye: No discharge.        Left eye: No discharge.     Conjunctiva/sclera: Conjunctivae normal.     Pupils: Pupils are equal, round, and reactive to light.  Neck:     Thyroid: No thyromegaly.     Vascular: No JVD.     Trachea: No tracheal deviation.  Cardiovascular:     Rate and Rhythm: Regular rhythm. Tachycardia present.     Heart sounds: No murmur.  Pulmonary:     Effort: Pulmonary effort is normal. No respiratory distress.     Breath sounds: Normal breath  sounds. No wheezing.  Abdominal:     General: Bowel sounds are normal. There is no distension.     Palpations: Abdomen is soft. There is no mass.     Tenderness: There is no abdominal tenderness. There is no guarding.     Comments: Bile drainage with mild surrounding erythema at the site but no tenderness. Drainage in bag  is yellow thick and purulent  Musculoskeletal:     Right lower leg: Edema present.     Left lower leg: Edema present.  Lymphadenopathy:     Cervical: No cervical adenopathy.  Skin:    General: Skin is warm and dry.     Coloration: Skin is not jaundiced.  Neurological:     General: No focal deficit present.     Mental Status: He is alert. Mental status is at baseline.     Cranial Nerves: No cranial nerve deficit.     Comments: Oriented x 2, MAE  Psychiatric:        Mood and Affect: Mood normal.     Labs reviewed: Recent Labs    11/25/18 0357 11/26/18 0430 11/27/18 0404 12/01/18 0700 12/12/18 1535  NA 139 138  --  138 134*  K 3.7 3.1*  --  3.4 3.9  CL 108 109  --   --  99  CO2 25 24  --   --  23  GLUCOSE 135* 133*  --   --  160*  BUN 14 12  --  13 18  CREATININE 0.88 0.81  --  0.8 0.94  CALCIUM 7.6* 7.6*  --   --  8.9  MG 2.0 2.0 2.0  --   --   PHOS 2.2* 2.7 3.3  --   --    Recent Labs    11/24/18 0423 11/25/18 0357 12/12/18 1535  AST 54* 65* 24  ALT 48* 58* 25  ALKPHOS 76 86 104  BILITOT 0.7 1.2 0.8  PROT 4.8* 5.2* 7.4  ALBUMIN 1.5* 1.7* 2.7*   Recent Labs    10/28/18 1629 11/21/18 0521  11/25/18 0357 11/26/18 0430 12/01/18 0700 12/12/18 1535  WBC 6.3 14.1*   < > 5.7 5.8 7.8 8.3  NEUTROABS 4,082 12.6*  --   --   --   --  5.4  HGB 11.1* 12.3*   < > 10.2* 9.9* 11.1* 12.5*  HCT 32.8* 36.3*   < > 32.9* 30.4* 32* 37.7*  MCV 96.2 93.3   < > 97.6 93.3  --  91.5  PLT 131* 163   < > 123* 137* 185 166   < > = values in this interval not displayed.   Lab Results  Component Value Date   TSH 1.75 02/05/2017   Lab Results  Component Value  Date   HGBA1C 5.2 08/13/2018   No results found for: CHOL, HDL, LDLCALC, LDLDIRECT, TRIG, CHOLHDL  Significant Diagnostic Results in last 30 days:  Ct Abdomen Pelvis Wo Contrast  Result Date: 11/21/2018 CLINICAL DATA:  Sepsis. EXAM: CT ABDOMEN AND PELVIS WITHOUT CONTRAST TECHNIQUE: Multidetector CT imaging of the abdomen and pelvis was performed following the standard protocol without IV contrast. COMPARISON:  CT scan 09/04/2018 FINDINGS: Lower chest: The heart is upper limits normal in size for age. Coronary artery calcifications and evidence of prior TAVR. Small bilateral pleural effusions and bibasilar atelectasis or infiltrates. Hepatobiliary: No focal hepatic lesions are identified without contrast. Numerous gallstones are noted in the gallbladder. Suspect mild gallbladder wall thickening and mild pericholecystic inflammatory changes. Could not exclude acute cholecystitis. No common bile duct dilatation. Pancreas: Moderate atrophy but no mass or acute inflammation. Spleen: Normal size.  No focal lesions. Adrenals/Urinary Tract: The adrenal glands are unremarkable and stable. Bilateral renal cysts but no worrisome renal masses. No renal or obstructing ureteral calculi. There is a Foley catheter in a decompressed bladder. No bladder mass or bladder calculi.  Stomach/Bowel: The stomach contains an NG tube. The duodenum, small bowel and colon are grossly normal. No acute inflammatory process, mass lesion or obstructive findings. Evidence of a prior right hemicolectomy. Sigmoid colon diverticulosis without findings for acute diverticulitis. Small amount of fluid noted in the right pericolic gutter and in the anterior pararenal space. Vascular/Lymphatic: Advanced atherosclerotic calcifications involving the aorta. 4.6 x 4.0 cm infrarenal abdominal aortic aneurysm with a left-sided saccular component. This measured a maximum of 4.5 cm on the prior study. Reproductive: The prostate gland and seminal vesicles are  grossly normal. Other: Small amount of free fluid in the pelvis but no pelvic mass or adenopathy. No inguinal mass or adenopathy. Musculoskeletal: No significant bony findings. Stable compression fracture of L1 with a vertebral plana appearance. IMPRESSION: 1. Cholelithiasis with findings suspicious for acute cholecystitis. Recommend correlation with right upper quadrant ultrasound examination. 2. Stable infrarenal abdominal aortic aneurysm with a left-sided saccular component. 3. Small bilateral pleural effusions and bibasilar atelectasis or infiltrates. Electronically Signed   By: Marijo Sanes M.D.   On: 11/21/2018 08:57   Ct Head Wo Contrast  Result Date: 12/12/2018 CLINICAL DATA:  Altered level of consciousness.  Unexplained. EXAM: CT HEAD WITHOUT CONTRAST TECHNIQUE: Contiguous axial images were obtained from the base of the skull through the vertex without intravenous contrast. COMPARISON:  10/09/2018 FINDINGS: Brain: No evidence of acute infarction, hemorrhage, hydrocephalus, extra-axial collection or mass lesion/mass effect. There is mild diffuse low-attenuation within the subcortical and periventricular white matter compatible with chronic microvascular disease. Prominence of the sulci and ventricles are identified compatible with brain atrophy. Vascular: No hyperdense vessel or unexpected calcification. Skull: Normal. Negative for fracture or focal lesion. Sinuses/Orbits: No acute finding. Other: None IMPRESSION: 1. No acute intracranial abnormality. 2. Chronic small vessel ischemic change and brain atrophy. Electronically Signed   By: Kerby Moors M.D.   On: 12/12/2018 17:22   US Abdomen Complete  Result Date: 11/21/2018 CLINICAL DATA:  Acute cholecystitis an urinary tract infection. Patient is intubated. Elevated total bilirubin. EXAM: ABDOMEN ULTRASOUND COMPLETE COMPARISON:  CT on 11/21/2018 a prior CT exams including 12/07/2013 FINDINGS: Gallbladder: Gallbladder wall is irregular and  thickened, measuring 7 millimeters. Dependent stones are present, measuring on the order of 1.7 centimeters or smaller. Common bile duct: Diameter: 5.4 millimeter Liver: The visualized portion of the liver is unremarkable. LEFT hepatic lobe is not well seen because of bowel gas. Portal vein is patent on color Doppler imaging with normal direction of blood flow towards the liver. IVC: Visualized portion normal. Pancreas: Obscured by bowel gas. Spleen: Obscured by bowel gas. Right Kidney: Length: 11.0 centimeters. 4.6 x 4.5 x 5.2 centimeter cyst containing internal septation. On CT exam 2015, this lesion measured 3.2 x 3.2 centimeters. No hydronephrosis. Left Kidney: Length: 12.3 centimeters. Simple cyst is 3.5 x 2.4 x 2.9 centimeters. No hydronephrosis. Abdominal aorta: Not visualized on the level of the mid abdominal aorta to the bifurcation segment 1 bowel gas. No visualized aneurysm. Other findings: Study quality is degraded by patient body habitus and intubation. Note is made of a small RIGHT pleural effusion. IMPRESSION: 1. Irregular, thickened gallbladder wall and multiple gallstones. Presence or absence of sonographic Murphy's can not be assessed due to the patient's intubated status. Findings are suspicious for acute cholecystitis however. 2. RIGHT pleural effusion. 3. Complex cystic lesion within the UPPER pole the RIGHT kidney. Lesion has progressed since prior studies. If possible and needed, consider further characterization with outpatient contrast-enhanced CT or MRI with intravenous contrast.  Electronically Signed   By: Nolon Nations M.D.   On: 11/21/2018 12:46   Ir Perc Cholecystostomy  Result Date: 11/23/2018 INDICATION: 85 year old male with a history of cholecystitis EXAM: CHOLECYSTOSTOMY MEDICATIONS: In-hospital antibiotics ANESTHESIA/SEDATION: Moderate (conscious) sedation was employed during this procedure. A total of Versed 2.0 mg and Fentanyl 0 mcg was administered intravenously.  Moderate Sedation Time: 0 minutes. The patient's level of consciousness and vital signs were monitored continuously by radiology nursing throughout the procedure under my direct supervision. FLUOROSCOPY TIME:  Fluoroscopy Time: 0 minutes 24 seconds (42.6 mGy). COMPLICATIONS: None PROCEDURE: Informed written consent was obtained from the patient and the patient's family after a thorough discussion of the procedural risks, benefits and alternatives. All questions were addressed. Maximal Sterile Barrier Technique was utilized including caps, mask, sterile gowns, sterile gloves, sterile drape, hand hygiene and skin antiseptic. A timeout was performed prior to the initiation of the procedure. Ultrasound survey of the right upper quadrant was performed for planning purposes. Once the patient is prepped and draped in the usual sterile fashion, the skin and subcutaneous tissues overlying the gallbladder were generously infiltrated 1% lidocaine for local anesthesia. A coaxial needle was advanced under ultrasound guidance through the skin subcutaneous tissues and a small segment of liver into the gallbladder lumen. With removal of the stylet, spontaneous dark bile drainage occurred. Using modified Seldinger technique, a 10 French drain was placed into the gallbladder fossa, with aspiration of the sample for the lab. Contrast injection confirmed position of the tube within the gallbladder lumen. Drainage catheter was attached to gravity drain with a suture retention placed. Patient tolerated the procedure well and remained hemodynamically stable throughout. No complications were encountered and no significant blood loss encountered. IMPRESSION: Status post image guided percutaneous cholecystostomy. Signed, Dulcy Fanny. Dellia Nims, RPVI [facility] Electronically Signed   By: Corrie Mckusick D.O.   On: 11/23/2018 08:16   Dg Chest Port 1 View  Result Date:  12/12/2018 CLINICAL DATA:  ALOC today. Hx of dementia, AAA, s/p TAVR, CHF, asthma. EXAM: PORTABLE CHEST 1 VIEW COMPARISON:  11/23/2018 FINDINGS: The heart is enlarged. Status post TAVR. The aorta is partially calcified. Shallow lung inflation. Minimal bilateral LOWER lobe atelectasis or scarring. The lungs are free of focal consolidations. No pulmonary edema. IMPRESSION: 1. Cardiomegaly without pulmonary edema. 2. Bilateral LOWER lobe atelectasis or scarring. 3.  Aortic atherosclerosis.  (ICD10-I70.0) Electronically Signed   By: Nolon Nations M.D.   On: 12/12/2018 16:03   Dg Chest Port 1 View  Result Date: 11/23/2018 CLINICAL DATA:  Acute respiratory failure EXAM: PORTABLE CHEST 1 VIEW COMPARISON:  Two days ago FINDINGS: Endotracheal tube tip between the clavicular heads and carina. The orogastric tube reaches the stomach. Low volume chest with streaky density at the bases. There may be trace pleural fluid. No Kerley lines or pneumothorax. Cardiomegaly. Transcatheter aortic valve replacement IMPRESSION: Stable hardware positioning, low lung volumes, and presumed atelectasis. Electronically Signed   By: Monte Fantasia M.D.   On: 11/23/2018 06:53   Dg Chest Portable 1 View  Result Date: 11/21/2018 CLINICAL DATA:  Intubation. EXAM: PORTABLE CHEST 1 VIEW COMPARISON:  Earlier film, same date. FINDINGS: The endotracheal tube is right at the carina and should be retracted approximately 3 cm. The NG tube is coursing down the esophagus and into the stomach. Stable cardiac enlargement. Bibasilar atelectasis but no definite infiltrates or effusions. IMPRESSION: The endotracheal tube is right at the carina and should be retracted at least  3 cm. Bibasilar atelectasis but no edema or infiltrates Electronically Signed   By: Marijo Sanes M.D.   On: 11/21/2018 07:40   Dg Chest Port 1 View  Result Date: 11/21/2018 CLINICAL DATA:  Sepsis. EXAM: PORTABLE CHEST 1 VIEW COMPARISON:  09/16/2018 FINDINGS: The heart is  enlarged but stable. Marked tortuosity and calcification of the thoracic aorta. Bibasilar atelectasis. No definite pleural effusions. No obvious infiltrates. IMPRESSION: Cardiac enlargement and marked tortuosity and calcification of the thoracic aorta. No definite infiltrates, effusions or edema. Streaky basilar atelectasis. Electronically Signed   By: Marijo Sanes M.D.   On: 11/21/2018 06:21   [place] Ekg Site Rite  Result Date: 11/26/2018 If Site Rite image not attached, placement could not be confirmed due to current cardiac rhythm.   Assessment/Plan 1. Tachycardia 102 at this time,improved from this morning Sinus tach on EKG with no fever or other vital sign changes. Eating and drinking well with no pain. Will check CBC BMP ESR CRP in the am.   2. Cholecystitis With bile drain in place (currently ordered for 6 weeks) Drainage is the same in amt but characteristically has changed from brown in color to purulent yellow. Staff to notify ID regarding this issue.  He is currently on Bactrim for UTI but the final culture showed no growth. Staff to notify ID regarding this issue as his scheduled follow up blood cultures for 3/7 will need to be changed. Given the purulent matter in the drainage bag and intermittent tachycardia  I will not discontinue the antibiotic at this time and await input from ID.      Family/ staff Communication: discussed with Ms. Gillingham  Labs/tests ordered:  CBC BMP ESR CRP in the am.

## 2018-12-15 ENCOUNTER — Telehealth: Payer: Self-pay | Admitting: Behavioral Health

## 2018-12-15 DIAGNOSIS — I2510 Atherosclerotic heart disease of native coronary artery without angina pectoris: Secondary | ICD-10-CM | POA: Diagnosis not present

## 2018-12-15 DIAGNOSIS — D649 Anemia, unspecified: Secondary | ICD-10-CM | POA: Diagnosis not present

## 2018-12-15 DIAGNOSIS — R278 Other lack of coordination: Secondary | ICD-10-CM | POA: Diagnosis not present

## 2018-12-15 DIAGNOSIS — M62561 Muscle wasting and atrophy, not elsewhere classified, right lower leg: Secondary | ICD-10-CM | POA: Diagnosis not present

## 2018-12-15 DIAGNOSIS — F0150 Vascular dementia without behavioral disturbance: Secondary | ICD-10-CM | POA: Diagnosis not present

## 2018-12-15 DIAGNOSIS — E7800 Pure hypercholesterolemia, unspecified: Secondary | ICD-10-CM | POA: Diagnosis not present

## 2018-12-15 DIAGNOSIS — R2689 Other abnormalities of gait and mobility: Secondary | ICD-10-CM | POA: Diagnosis not present

## 2018-12-15 DIAGNOSIS — R7881 Bacteremia: Secondary | ICD-10-CM | POA: Diagnosis not present

## 2018-12-15 DIAGNOSIS — A419 Sepsis, unspecified organism: Secondary | ICD-10-CM | POA: Diagnosis not present

## 2018-12-15 DIAGNOSIS — M2550 Pain in unspecified joint: Secondary | ICD-10-CM | POA: Diagnosis not present

## 2018-12-15 DIAGNOSIS — M62562 Muscle wasting and atrophy, not elsewhere classified, left lower leg: Secondary | ICD-10-CM | POA: Diagnosis not present

## 2018-12-15 LAB — BASIC METABOLIC PANEL
BUN: 17 (ref 4–21)
Creatinine: 0.9 (ref 0.6–1.3)
Glucose: 145
Potassium: 4.2 (ref 3.4–5.3)
Sodium: 133 — AB (ref 137–147)

## 2018-12-15 LAB — CBC AND DIFFERENTIAL
HEMATOCRIT: 34 — AB (ref 41–53)
Hemoglobin: 12 — AB (ref 13.5–17.5)
PLATELETS: 165 (ref 150–399)
WBC: 7

## 2018-12-15 LAB — POCT ERYTHROCYTE SEDIMENTATION RATE, NON-AUTOMATED: Sed Rate: 118

## 2018-12-15 NOTE — Telephone Encounter (Addendum)
[name] nurse from [facility] called about William [name].  She states William [name] was treated with Zosyn up until 2/22 and PICC was pulled at that time.  William [name] went to the ED 12/12/2018 for UTI and was then prescribed Bactrim DS which he has been taking.  [name] gave orders to do blood cultures 2 weeks after (December 19, 2018) being off of IV antibiotics however now that patient is on Bactrim the NP at [facility] fears this will skew the Blood culture results.  Patient did have blood cultures 12/12/2018 while in the ED.  [name] wants to know if they still need to do Blood cultures 12/19/2018?  They also state his drain is now draining purulent drainage.  Informed [name] [name] and [name] would be made aware and will let them know what needs to be done. [name] RN

## 2018-12-15 NOTE — ACP (Advance Care Planning) (Signed)
12/08/18  When seen, he interacted minimally.  He would speak and attempt to respond, but cannot provide full sentences of meaningful speech.  He is very HOH, as well.  He continues to rest in the bed.  His wife was visiting.  She notes that he has taken some major steps backward with this last hospitalization.  The social worker has offered them a permanent bed in the SNF unit, but Mrs. Sandt is very clear that she thinks he is going to get stronger and go back home with her with caregiver assistance.  She goes back to when he was ambulating with a walker room to room before his hospitalization at the end of Nov '19 when he had the sepsis that infected his ICD lead and his ICD had to be removed.  Therapy and staff have been indicating to her that he's not been making much progress, but she remains hopeful that he will recover to that point.  Since the ICD was removed (in place due to VT), she has requested his code status to be full code.    With his cholecystitis episode, drain remains in place with green to brown drainage which is minimal.  He has some mild tenderness of that area.  He remains a bit more lethargic than prior to this last admission.  Less talkative and weak.      We reviewed that the team would keep working with him and come back around with an update about his progress; however, he is at risk of the gall bladder condition never resolving or becoming more fulminant and he is a very poor, high risk surgical patient.    She expressed that she wants whatever needs to be done to get him back home with her.  25 mins spent on ACP.

## 2018-12-16 DIAGNOSIS — A419 Sepsis, unspecified organism: Secondary | ICD-10-CM | POA: Diagnosis not present

## 2018-12-16 DIAGNOSIS — M62561 Muscle wasting and atrophy, not elsewhere classified, right lower leg: Secondary | ICD-10-CM | POA: Diagnosis not present

## 2018-12-16 DIAGNOSIS — R2689 Other abnormalities of gait and mobility: Secondary | ICD-10-CM | POA: Diagnosis not present

## 2018-12-16 DIAGNOSIS — R278 Other lack of coordination: Secondary | ICD-10-CM | POA: Diagnosis not present

## 2018-12-16 DIAGNOSIS — F0150 Vascular dementia without behavioral disturbance: Secondary | ICD-10-CM | POA: Diagnosis not present

## 2018-12-16 DIAGNOSIS — M62562 Muscle wasting and atrophy, not elsewhere classified, left lower leg: Secondary | ICD-10-CM | POA: Diagnosis not present

## 2018-12-16 NOTE — Telephone Encounter (Signed)
Sent message to primary physician through staff messages. Would recommend cultures of the fluid prior to starting Augmentin twice daily. We do have source control with the drain and place and that may need further evaluation by IR or additional imaging may be required.

## 2018-12-16 NOTE — Telephone Encounter (Signed)
He can discontinue the Bactrim as it is likely he did not have a urinary tract infection and was dehydrated. I have requested the facility to attempt to obtain cultures prior to starting Augmentin. May need further imaging and IR evaluation to ensure we have obtained source control. Sorry if confusing.

## 2018-12-17 ENCOUNTER — Other Ambulatory Visit (HOSPITAL_COMMUNITY): Payer: Self-pay | Admitting: Interventional Radiology

## 2018-12-17 DIAGNOSIS — K8012 Calculus of gallbladder with acute and chronic cholecystitis without obstruction: Secondary | ICD-10-CM | POA: Diagnosis not present

## 2018-12-17 DIAGNOSIS — Z79899 Other long term (current) drug therapy: Secondary | ICD-10-CM | POA: Diagnosis not present

## 2018-12-17 DIAGNOSIS — M62561 Muscle wasting and atrophy, not elsewhere classified, right lower leg: Secondary | ICD-10-CM | POA: Diagnosis not present

## 2018-12-17 DIAGNOSIS — R2689 Other abnormalities of gait and mobility: Secondary | ICD-10-CM | POA: Diagnosis not present

## 2018-12-17 DIAGNOSIS — M62562 Muscle wasting and atrophy, not elsewhere classified, left lower leg: Secondary | ICD-10-CM | POA: Diagnosis not present

## 2018-12-17 DIAGNOSIS — R278 Other lack of coordination: Secondary | ICD-10-CM | POA: Diagnosis not present

## 2018-12-17 DIAGNOSIS — F0150 Vascular dementia without behavioral disturbance: Secondary | ICD-10-CM | POA: Diagnosis not present

## 2018-12-17 DIAGNOSIS — A419 Sepsis, unspecified organism: Secondary | ICD-10-CM | POA: Diagnosis not present

## 2018-12-17 LAB — CULTURE, BLOOD (ROUTINE X 2)
CULTURE: NO GROWTH
Culture: NO GROWTH

## 2018-12-18 ENCOUNTER — Other Ambulatory Visit (HOSPITAL_COMMUNITY): Payer: Self-pay | Admitting: Interventional Radiology

## 2018-12-18 ENCOUNTER — Other Ambulatory Visit (HOSPITAL_COMMUNITY): Payer: Self-pay | Admitting: Physician Assistant

## 2018-12-18 ENCOUNTER — Telehealth: Payer: Self-pay | Admitting: *Deleted

## 2018-12-18 ENCOUNTER — Ambulatory Visit (HOSPITAL_COMMUNITY)
Admission: RE | Admit: 2018-12-18 | Discharge: 2018-12-18 | Disposition: A | Discharge status: Home / Self Care | Payer: Medicare Other | Source: Ambulatory Visit | Attending: Physician Assistant | Admitting: Physician Assistant

## 2018-12-18 ENCOUNTER — Encounter: Payer: Medicare Other | Admitting: Cardiovascular Disease

## 2018-12-18 ENCOUNTER — Encounter (HOSPITAL_COMMUNITY): Payer: Self-pay | Admitting: Interventional Radiology

## 2018-12-18 DIAGNOSIS — T85590A Other mechanical complication of bile duct prosthesis, initial encounter: Secondary | ICD-10-CM | POA: Diagnosis not present

## 2018-12-18 DIAGNOSIS — K810 Acute cholecystitis: Secondary | ICD-10-CM | POA: Diagnosis not present

## 2018-12-18 DIAGNOSIS — A419 Sepsis, unspecified organism: Secondary | ICD-10-CM | POA: Diagnosis not present

## 2018-12-18 DIAGNOSIS — Z4803 Encounter for change or removal of drains: Secondary | ICD-10-CM | POA: Diagnosis not present

## 2018-12-18 DIAGNOSIS — F0150 Vascular dementia without behavioral disturbance: Secondary | ICD-10-CM | POA: Diagnosis not present

## 2018-12-18 DIAGNOSIS — Z7401 Bed confinement status: Secondary | ICD-10-CM | POA: Diagnosis not present

## 2018-12-18 DIAGNOSIS — R5381 Other malaise: Secondary | ICD-10-CM | POA: Diagnosis not present

## 2018-12-18 DIAGNOSIS — M62561 Muscle wasting and atrophy, not elsewhere classified, right lower leg: Secondary | ICD-10-CM | POA: Diagnosis not present

## 2018-12-18 DIAGNOSIS — R2689 Other abnormalities of gait and mobility: Secondary | ICD-10-CM | POA: Diagnosis not present

## 2018-12-18 DIAGNOSIS — M62562 Muscle wasting and atrophy, not elsewhere classified, left lower leg: Secondary | ICD-10-CM | POA: Diagnosis not present

## 2018-12-18 DIAGNOSIS — M2550 Pain in unspecified joint: Secondary | ICD-10-CM | POA: Diagnosis not present

## 2018-12-18 DIAGNOSIS — Z434 Encounter for attention to other artificial openings of digestive tract: Secondary | ICD-10-CM

## 2018-12-18 DIAGNOSIS — R278 Other lack of coordination: Secondary | ICD-10-CM | POA: Diagnosis not present

## 2018-12-18 HISTORY — PX: IR EXCHANGE BILIARY DRAIN: IMG6046

## 2018-12-18 MED ORDER — IOPAMIDOL (ISOVUE-300) INJECTION 61%
INTRAVENOUS | Status: AC
  Filled 2018-12-18: qty 50

## 2018-12-18 MED ORDER — LIDOCAINE HCL 1 % IJ SOLN
INTRAMUSCULAR | Status: AC
  Filled 2018-12-18: qty 20

## 2018-12-18 MED ORDER — LIDOCAINE HCL 1 % IJ SOLN
INTRAMUSCULAR | Status: AC | PRN
  Administered 2018-12-18: 5 mL

## 2018-12-18 MED ORDER — SODIUM CHLORIDE 0.9% FLUSH: 5.0000 mL | Freq: Three times a day (TID) | INTRAVENOUS | Status: DC

## 2018-12-18 NOTE — Telephone Encounter (Signed)
Awesome! Thanks

## 2018-12-18 NOTE — Telephone Encounter (Signed)
Junie Panning, nurse at [facility], called for clarification of blood culture orders.  The patient started 10 day course of augmentin 3/4, ending 3/14. He had cultures drawn from the drain before starting augmentin 3/4, preliminary results received 3/6.  Patient is currently at IR for drain evaluation (3/6). Per [name], no blood cultures at this time, would like patient to follow up at [facility] the week after antibiotics are complete.  RN relayed verbal order to Autumn, nurse at [facility], scheduled patient for follow up 3/17 at 11:15 with [name]. Landis Gandy, RN

## 2018-12-18 NOTE — Procedures (Signed)
Calculus cholecystitis  S/p cholecystostomy exchg  No comp Stable ebl 0 Keep to gravity Full report in pacs

## 2018-12-21 DIAGNOSIS — R278 Other lack of coordination: Secondary | ICD-10-CM | POA: Diagnosis not present

## 2018-12-21 DIAGNOSIS — A419 Sepsis, unspecified organism: Secondary | ICD-10-CM | POA: Diagnosis not present

## 2018-12-21 DIAGNOSIS — M62562 Muscle wasting and atrophy, not elsewhere classified, left lower leg: Secondary | ICD-10-CM | POA: Diagnosis not present

## 2018-12-21 DIAGNOSIS — F0150 Vascular dementia without behavioral disturbance: Secondary | ICD-10-CM | POA: Diagnosis not present

## 2018-12-21 DIAGNOSIS — M62561 Muscle wasting and atrophy, not elsewhere classified, right lower leg: Secondary | ICD-10-CM | POA: Diagnosis not present

## 2018-12-21 DIAGNOSIS — R2689 Other abnormalities of gait and mobility: Secondary | ICD-10-CM | POA: Diagnosis not present

## 2018-12-22 DIAGNOSIS — F0150 Vascular dementia without behavioral disturbance: Secondary | ICD-10-CM | POA: Diagnosis not present

## 2018-12-22 DIAGNOSIS — R2689 Other abnormalities of gait and mobility: Secondary | ICD-10-CM | POA: Diagnosis not present

## 2018-12-22 DIAGNOSIS — A419 Sepsis, unspecified organism: Secondary | ICD-10-CM | POA: Diagnosis not present

## 2018-12-22 DIAGNOSIS — M62561 Muscle wasting and atrophy, not elsewhere classified, right lower leg: Secondary | ICD-10-CM | POA: Diagnosis not present

## 2018-12-22 DIAGNOSIS — M62562 Muscle wasting and atrophy, not elsewhere classified, left lower leg: Secondary | ICD-10-CM | POA: Diagnosis not present

## 2018-12-22 DIAGNOSIS — R278 Other lack of coordination: Secondary | ICD-10-CM | POA: Diagnosis not present

## 2018-12-23 ENCOUNTER — Telehealth: Payer: Self-pay

## 2018-12-23 DIAGNOSIS — F0150 Vascular dementia without behavioral disturbance: Secondary | ICD-10-CM | POA: Diagnosis not present

## 2018-12-23 DIAGNOSIS — M62562 Muscle wasting and atrophy, not elsewhere classified, left lower leg: Secondary | ICD-10-CM | POA: Diagnosis not present

## 2018-12-23 DIAGNOSIS — A419 Sepsis, unspecified organism: Secondary | ICD-10-CM | POA: Diagnosis not present

## 2018-12-23 DIAGNOSIS — R2689 Other abnormalities of gait and mobility: Secondary | ICD-10-CM | POA: Diagnosis not present

## 2018-12-23 DIAGNOSIS — M62561 Muscle wasting and atrophy, not elsewhere classified, right lower leg: Secondary | ICD-10-CM | POA: Diagnosis not present

## 2018-12-23 DIAGNOSIS — R278 Other lack of coordination: Secondary | ICD-10-CM | POA: Diagnosis not present

## 2018-12-23 NOTE — Telephone Encounter (Signed)
Junie Panning, Rn from [facility] facility called office today to inform Terri Piedra, Np that patient will be arriving to his appointment on 3/17 on a wheelchair. Rn states they are trying to help patient with transportation cost. Rn states if Terri Piedra, NP would like patient to arrive to his appointment by EMS just to inform RN to have this arranged. [facility]: [place], [place]

## 2018-12-24 DIAGNOSIS — R278 Other lack of coordination: Secondary | ICD-10-CM | POA: Diagnosis not present

## 2018-12-24 DIAGNOSIS — M62562 Muscle wasting and atrophy, not elsewhere classified, left lower leg: Secondary | ICD-10-CM | POA: Diagnosis not present

## 2018-12-24 DIAGNOSIS — R2689 Other abnormalities of gait and mobility: Secondary | ICD-10-CM | POA: Diagnosis not present

## 2018-12-24 DIAGNOSIS — F0150 Vascular dementia without behavioral disturbance: Secondary | ICD-10-CM | POA: Diagnosis not present

## 2018-12-24 DIAGNOSIS — A419 Sepsis, unspecified organism: Secondary | ICD-10-CM | POA: Diagnosis not present

## 2018-12-24 DIAGNOSIS — M62561 Muscle wasting and atrophy, not elsewhere classified, right lower leg: Secondary | ICD-10-CM | POA: Diagnosis not present

## 2018-12-24 NOTE — Telephone Encounter (Signed)
I am okay with whatever way he is able to get to the appointment.

## 2018-12-25 NOTE — Telephone Encounter (Signed)
Patient's daughter called to see if 3/17 appointment is necessary at this time or if it should be postponed due to circumstances and his fragility.  Please advise. Landis Gandy, RN

## 2018-12-25 NOTE — Telephone Encounter (Signed)
Given the current circumstances we can postpone his appointment for now. If he is worsening, would recommend referral to the ED.

## 2018-12-25 NOTE — Telephone Encounter (Addendum)
Called patient's wife and rescheduled his appointment.  Patient's wife wanted to know if he needed additional lab work before his next appointment since his appointment was postponed.  Informed wife that RN will see if labs need to be done and will call her back with an answer.  Patient can get lab work completed at [facility]. [name] RN

## 2018-12-28 ENCOUNTER — Telehealth: Payer: Self-pay

## 2018-12-28 NOTE — Telephone Encounter (Signed)
Spoke with patient's wife who states she would like to talk to the Md at [facility] and Surgeon (appt 3/25). Patient's wife will also call IR regarding concerns with drain. Webb Silversmith will call office if she still feels like she needs to be seen before scheduled appointment. William [name]

## 2018-12-28 NOTE — Telephone Encounter (Signed)
Patient's wife called office today stating that patient is currently at [facility] facility. Webb Silversmith states that the patient is pulling on the bandage that is around drain. Living facility is concerned that patient will remove drain if he keeps pulling on surrounding bandage. Webb Silversmith would like to know if patient should schedule an appointment before 3/31. Will route message to Terri Piedra, Np to advise if patient should be seen before scheduled appointment. [place]

## 2018-12-28 NOTE — Telephone Encounter (Signed)
They will need to follow up with interventional radiology in regards to the drain as they will decide when it is safe for the drain to come out. I am happy to see him before 3/31 if needed.

## 2018-12-29 ENCOUNTER — Ambulatory Visit: Payer: Medicare Other | Admitting: Family

## 2019-01-05 ENCOUNTER — Encounter (HOSPITAL_COMMUNITY): Payer: Self-pay | Admitting: Interventional Radiology

## 2019-01-05 ENCOUNTER — Other Ambulatory Visit (HOSPITAL_COMMUNITY): Payer: Self-pay | Admitting: Internal Medicine

## 2019-01-05 ENCOUNTER — Other Ambulatory Visit: Payer: Self-pay

## 2019-01-05 ENCOUNTER — Ambulatory Visit (HOSPITAL_COMMUNITY)
Admission: RE | Admit: 2019-01-05 | Discharge: 2019-01-05 | Disposition: A | Discharge status: Home / Self Care | Payer: Medicare Other | Source: Ambulatory Visit | Attending: Internal Medicine | Admitting: Internal Medicine

## 2019-01-05 DIAGNOSIS — K8309 Other cholangitis: Secondary | ICD-10-CM | POA: Insufficient documentation

## 2019-01-05 DIAGNOSIS — Z4803 Encounter for change or removal of drains: Secondary | ICD-10-CM | POA: Insufficient documentation

## 2019-01-05 DIAGNOSIS — T85590A Other mechanical complication of bile duct prosthesis, initial encounter: Secondary | ICD-10-CM | POA: Diagnosis not present

## 2019-01-05 HISTORY — PX: IR EXCHANGE BILIARY DRAIN: IMG6046

## 2019-01-05 MED ORDER — LIDOCAINE HCL 1 % IJ SOLN
INTRAMUSCULAR | Status: AC | PRN
  Administered 2019-01-05: 5 mL

## 2019-01-05 MED ORDER — IOHEXOL 300 MG/ML  SOLN
50.0000 mL | Freq: Once | INTRAMUSCULAR | Status: AC | PRN
  Administered 2019-01-05: 20 mL

## 2019-01-05 MED ORDER — LIDOCAINE HCL 1 % IJ SOLN
INTRAMUSCULAR | Status: AC
  Filled 2019-01-05: qty 20

## 2019-01-05 NOTE — Procedures (Signed)
Pre procedural Dx: Acute Cholelithiasis, poor operative candidate. Post procedural Dx: Same  Technically successful Fluoro guided exchange and up sizing of now 12 Fr drainage chole tube. Chole tube connected to gravity bag.   EBL: None Complications: None immediate  Ronny Bacon, MD Pager #: [phone]

## 2019-01-06 ENCOUNTER — Ambulatory Visit (HOSPITAL_COMMUNITY): Payer: Medicare Other

## 2019-01-07 ENCOUNTER — Ambulatory Visit (HOSPITAL_COMMUNITY): Payer: Medicare Other

## 2019-01-08 ENCOUNTER — Encounter: Payer: Self-pay | Admitting: Adult Health

## 2019-01-08 ENCOUNTER — Non-Acute Institutional Stay (SKILLED_NURSING_FACILITY): Payer: Medicare Other | Admitting: Adult Health

## 2019-01-08 DIAGNOSIS — K8309 Other cholangitis: Secondary | ICD-10-CM | POA: Diagnosis not present

## 2019-01-08 DIAGNOSIS — F0150 Vascular dementia without behavioral disturbance: Secondary | ICD-10-CM

## 2019-01-08 DIAGNOSIS — I472 Ventricular tachycardia, unspecified: Secondary | ICD-10-CM

## 2019-01-08 DIAGNOSIS — I5022 Chronic systolic (congestive) heart failure: Secondary | ICD-10-CM | POA: Diagnosis not present

## 2019-01-08 DIAGNOSIS — K5900 Constipation, unspecified: Secondary | ICD-10-CM | POA: Insufficient documentation

## 2019-01-08 DIAGNOSIS — I350 Nonrheumatic aortic (valve) stenosis: Secondary | ICD-10-CM

## 2019-01-08 DIAGNOSIS — K5901 Slow transit constipation: Secondary | ICD-10-CM | POA: Diagnosis not present

## 2019-01-08 NOTE — Progress Notes (Signed)
Location:  Occupational psychologist of Service:  SNF (31) Provider:   Cindi Carbon, [place] [phone]   Tisovec, Fransico Him, MD  Patient Care Team: Tisovec, Fransico Him, MD as PCP - General (Internal Medicine) Sanda Klein, MD as PCP - Cardiology (Cardiology) Carolan Clines, MD (Inactive) as Consulting Physician (Urology)  Extended Emergency Contact Information Primary Emergency Contact: [facility] Address: [address]          [place], [place] [address] [name] of [place] Phone: [phone] Mobile Phone: [phone] Relation: Spouse Secondary Emergency Contact: Michail Sermon, [place] Phone: [phone] Mobile Phone: [phone] Relation: Daughter  Code Status:  Full code Goals of care: Advanced Directive information Advanced Directives 12/08/2018  Does Patient Have a Medical Advance Directive? No  Type of Advance Directive -  Does patient want to make changes to medical advance directive? -  Copy of Alpha in Chart? -  Would patient like information on creating a medical advance directive? No - Patient declined  Pre-existing out of facility DNR order (yellow form or pink MOST form) -     Chief Complaint  Patient presents with   Medical Management of Chronic Issues    HPI:  Pt is a 85 y.o. male seen today for medical management of chronic diseases.   He recently moved to skilled care after a prolonged stay in rehab with minimal progress with therapy. He has experienced sepsis multiple times to include an infected ICD with removal, UTI sepsis, and most recently cholecystitis. He had a bile drain which became infected and was treated with Cipro 3/7-3/14/20 with a urine culture showing >100,000 colonies of enterobacter cloacae. He has not had any new episodes of fever, tachycardia, or decreased appetite. His multiple health issues have left  him weaker and he needs a hoyer lift for all transfers. He is incontinent of stool and urine. He was supposed to f/u with surgery regarding his bile drain but due to Covid 19 his apt was canceled.  The drain was exchanged this week, typical output would be around 50cc per day, over the past 24 hrs it has drained 15 cc.   Past Medical History:  Diagnosis Date   AAA (abdominal aortic aneurysm) ([place])    7/13 3.8cm   Arthritis    "joints" (01/11/2014)   Asthma    "seasonal; some foods"    Chronic systolic CHF (congestive heart failure) (HCC)    Dementia (HCC)    HLD (hyperlipidemia)    HOH (hard of hearing)    Lumbar vertebral fracture (HCC)    L1- 04/11/2014    Prostate cancer (HCC)    S/P ICD (internal cardiac defibrillator) procedure, 01/11/14 removal of ERI gen and placement of Medtronic Evera XT VR & NEW Right ventricular lead Medtronic 01/12/2014   S/P TAVR (transcatheter aortic valve replacement)    Edwards Sapien 3 THV (size 29 mm, model # [id number], serial # [id number])   Severe aortic stenosis    Sleep apnea    "lost 60# & don't have it anymore" (01/11/2014)   Ventricular tachycardia [facility])    Past Surgical History:  Procedure Laterality Date   CARDIAC CATHETERIZATION  08/20/2004   noncritical CAD,mild global hypokinesis, EF 50%   CARDIAC DEFIBRILLATOR PLACEMENT  08/23/2004   Medtronic   CATARACT EXTRACTION W/ INTRAOCULAR LENS IMPLANT Right    COLECTOMY  1990's   CRYOABLATION N/A 02/24/2014  Procedure: CRYO ABLATION PROSTATE;  Surgeon: Ailene Rud, MD;  Location: [facility] ORS;  Service: Urology;  Laterality: N/A;   HERNIA REPAIR     "abdomen; from colon OR"   ICD LEAD REMOVAL N/A 09/15/2018   Procedure: ICD LEAD REMOVAL EXTRACTION;  Surgeon: Evans Lance, MD;  Location: [facility] OR;  Service: Cardiovascular;  Laterality: N/A;  DR. BARTLE TO BACK UP   IMPLANTABLE CARDIOVERTER DEFIBRILLATOR (ICD) GENERATOR CHANGE N/A 01/11/2014   Procedure: ICD GENERATOR CHANGE;   Surgeon: Sanda Klein, MD;  Location: [facility] CATH LAB;  Service: Cardiovascular;  Laterality: N/A;   IR EXCHANGE BILIARY DRAIN  12/18/2018   IR EXCHANGE BILIARY DRAIN  01/05/2019   IR PERC CHOLECYSTOSTOMY  11/22/2018   LEAD REVISION N/A 01/11/2014   Procedure: LEAD REVISION;  Surgeon: Sanda Klein, MD;  Location: [facility] CATH LAB;  Service: Cardiovascular;  Laterality: N/A;   NM MYOCAR PERF WALL MOTION  01/29/2012   abnormal c/o infarct/scar,no ischemia present   PROSTATE BIOPSY N/A 11/22/2013   Procedure: PROSTATE BIOPSY AND ULTRASOUND;  Surgeon: Ailene Rud, MD;  Location: [facility] ORS;  Service: Urology;  Laterality: N/A;   RIGHT/LEFT HEART CATH AND CORONARY ANGIOGRAPHY N/A 07/06/2018   Procedure: RIGHT/LEFT HEART CATH AND CORONARY ANGIOGRAPHY;  Surgeon: Troy Sine, MD;  Location: [facility] CV LAB;  Service: Cardiovascular;  Laterality: N/A;   TEE WITHOUT CARDIOVERSION N/A 08/18/2018   Procedure: TRANSESOPHAGEAL ECHOCARDIOGRAM (TEE);  Surgeon: Burnell Blanks, MD;  Location: [place];  Service: Open Heart Surgery;  Laterality: N/A;   TEE WITHOUT CARDIOVERSION N/A 09/08/2018   Procedure: TRANSESOPHAGEAL ECHOCARDIOGRAM (TEE);  Surgeon: Lelon Perla, MD;  Location: [facility] ENDOSCOPY;  Service: Cardiovascular;  Laterality: N/A;   TEE WITHOUT CARDIOVERSION N/A 09/15/2018   Procedure: TRANSESOPHAGEAL ECHOCARDIOGRAM (TEE);  Surgeon: Evans Lance, MD;  Location: [facility] OR;  Service: Cardiovascular;  Laterality: N/A;   TEE WITHOUT CARDIOVERSION N/A 11/26/2018   Procedure: TRANSESOPHAGEAL ECHOCARDIOGRAM (TEE);  Surgeon: Jerline Pain, MD;  Location: [facility] ENDOSCOPY;  Service: Cardiovascular;  Laterality: N/A;   TRANSCATHETER AORTIC VALVE REPLACEMENT, TRANSFEMORAL  08/18/2018   TRANSCATHETER AORTIC VALVE REPLACEMENT, TRANSFEMORAL N/A 08/18/2018   Procedure: TRANSCATHETER AORTIC VALVE REPLACEMENT, TRANSFEMORAL using an Edwards 80mm Aortic Valve;  Surgeon: Burnell Blanks, MD;  Location: [facility];  Service: Open Heart Surgery;  Laterality: N/A;    Allergies  Allergen Reactions   Crestor [Rosuvastatin] Other (See Comments)    Hurts muscles   Lipitor [Atorvastatin] Other (See Comments)    Hurts stomach   Shrimp [Shellfish Allergy] Other (See Comments)    On MAR    Outpatient Encounter Medications as of 01/08/2019  Medication Sig   acetaminophen (TYLENOL) 500 MG tablet Take 500 mg by mouth every 6 (six) hours as needed (for pain).    amiodarone (PACERONE) 200 MG tablet Take 0.5 tablets (100 mg total) by mouth daily.   aspirin 81 MG chewable tablet Chew 1 tablet (81 mg total) by mouth daily.   bisacodyl (DULCOLAX) 10 MG suppository Place 1 suppository (10 mg total) rectally daily as needed for moderate constipation.   cholecalciferol (VITAMIN D) 1000 units tablet Take 2,000 Units by mouth at bedtime.   clopidogrel (PLAVIX) 75 MG tablet Take 1 tablet (75 mg total) by mouth daily with breakfast.   metoprolol succinate (TOPROL-XL) 25 MG 24 hr tablet Take 1 tablet (25 mg total) by mouth daily. Take with or immediately following a meal.   triamcinolone (KENALOG) 0.025 % ointment Apply 1 application  topically 2 (two) times daily.   polyethylene glycol (MIRALAX / GLYCOLAX) packet Take 17 g by mouth every evening.   [DISCONTINUED] Multiple Vitamins-Minerals (PRESERVISION AREDS 2 PO) Take 1 capsule by mouth daily.    No facility-administered encounter medications on file as of 01/08/2019.     Review of Systems  Unable to perform ROS: Dementia    Immunization History  Administered Date(s) Administered   Influenza, High Dose Seasonal PF 08/13/2017, 07/03/2018   Influenza,inj,Quad PF,6+ Mos 08/18/2013, 07/23/2016   Pneumococcal Conjugate-13 08/14/2013   Pneumococcal Polysaccharide-23 04/06/2009   Pertinent  Health Maintenance Due  Topic Date Due   INFLUENZA VACCINE  Completed   PNA vac Low Risk Adult  Completed   Fall Risk  12/03/2018  Falls in the past year?  0   Functional Status Survey:    Vitals:   01/08/19 1600  BP: 107/72  Pulse: 98  Resp: 20  Temp: (!) 96.4 F (35.8 C)  SpO2: 98%  Weight: 203 lb 6.4 oz (92.3 kg)   Body mass index is 29.18 kg/m. Physical Exam Vitals signs and nursing note reviewed.  Constitutional:      General: He is not in acute distress.    Appearance: Normal appearance.  HENT:     Head: Normocephalic and atraumatic.  Cardiovascular:     Rate and Rhythm: Normal rate and regular rhythm.     Heart sounds: No murmur.  Pulmonary:     Effort: Pulmonary effort is normal. No respiratory distress.     Breath sounds: Normal breath sounds. No wheezing.  Abdominal:     General: Abdomen is flat. Bowel sounds are normal. There is no distension.     Palpations: Abdomen is soft. There is no mass.     Tenderness: There is no abdominal tenderness.     Hernia: No hernia is present.     Comments: Right biliary drain with brown liquid in bag. Site is clean dry intact  Musculoskeletal:     Right lower leg: No edema.     Left lower leg: No edema.  Skin:    General: Skin is warm and dry.     Comments: Multiple areas of ecchymoses on both arms.   Neurological:     General: No focal deficit present.     Mental Status: He is alert. Mental status is at baseline.     Comments: Oriented to self only. Able to f/c  Psychiatric:        Mood and Affect: Mood normal.     Labs reviewed: Recent Labs    11/25/18 0357 11/26/18 0430 11/27/18 0404 12/01/18 12/12/18 1535 12/15/18  NA 139 138  --  138 134* 133*  K 3.7 3.1*  --  3.4 3.9 4.2  CL 108 109  --   --  99  --   CO2 25 24  --   --  23  --   GLUCOSE 135* 133*  --   --  160*  --   BUN 14 12  --  13 18 17   CREATININE 0.88 0.81  --  0.8 0.94 0.9  CALCIUM 7.6* 7.6*  --   --  8.9  --   MG 2.0 2.0 2.0  --   --   --   PHOS 2.2* 2.7 3.3  --   --   --    Recent Labs    11/24/18 0423 11/25/18 0357 12/12/18 1535  AST 54* 65* 24  ALT 48* 58* 25  ALKPHOS  76 86 104    BILITOT 0.7 1.2 0.8  PROT 4.8* 5.2* 7.4  ALBUMIN 1.5* 1.7* 2.7*   Recent Labs    10/28/18 1629 11/21/18 0521  11/25/18 0357 11/26/18 0430 12/01/18 12/12/18 1535 12/15/18  WBC 6.3 14.1*   < > 5.7 5.8 7.8 8.3 7.0  NEUTROABS 4,082 12.6*  --   --   --   --  5.4  --   HGB 11.1* 12.3*   < > 10.2* 9.9* 11.1* 12.5* 12.0*  HCT 32.8* 36.3*   < > 32.9* 30.4* 32* 37.7* 34*  MCV 96.2 93.3   < > 97.6 93.3  --  91.5  --   PLT 131* 163   < > 123* 137* 185 166 165   < > = values in this interval not displayed.   Lab Results  Component Value Date   TSH 1.75 02/05/2017   Lab Results  Component Value Date   HGBA1C 5.2 08/13/2018   No results found for: CHOL, HDL, LDLCALC, LDLDIRECT, TRIG, CHOLHDL  Significant Diagnostic Results in last 30 days:  Ct Head Wo Contrast  Result Date: 12/12/2018 CLINICAL DATA:  Altered level of consciousness.  Unexplained. EXAM: CT HEAD WITHOUT CONTRAST TECHNIQUE: Contiguous axial images were obtained from the base of the skull through the vertex without intravenous contrast. COMPARISON:  10/09/2018 FINDINGS: Brain: No evidence of acute infarction, hemorrhage, hydrocephalus, extra-axial collection or mass lesion/mass effect. There is mild diffuse low-attenuation within the subcortical and periventricular white matter compatible with chronic microvascular disease. Prominence of the sulci and ventricles are identified compatible with brain atrophy. Vascular: No hyperdense vessel or unexpected calcification. Skull: Normal. Negative for fracture or focal lesion. Sinuses/Orbits: No acute finding. Other: None IMPRESSION: 1. No acute intracranial abnormality. 2. Chronic small vessel ischemic change and brain atrophy. Electronically Signed   By: Kerby Moors M.D.   On: 12/12/2018 17:22   Dg Chest Port 1 View  Result Date: 12/12/2018 CLINICAL DATA:  ALOC today. Hx of dementia, AAA, s/p TAVR, CHF, asthma. EXAM: PORTABLE CHEST 1 VIEW COMPARISON:  11/23/2018 FINDINGS: The heart is  enlarged. Status post TAVR. The aorta is partially calcified. Shallow lung inflation. Minimal bilateral LOWER lobe atelectasis or scarring. The lungs are free of focal consolidations. No pulmonary edema. IMPRESSION: 1. Cardiomegaly without pulmonary edema. 2. Bilateral LOWER lobe atelectasis or scarring. 3.  Aortic atherosclerosis.  (ICD10-I70.0) Electronically Signed   By: Nolon Nations M.D.   On: 12/12/2018 16:03   Ir Exchange Biliary Drain  Result Date: 01/05/2019 INDICATION: History of acute cholecystitis, poor operative candidate, post image guided cholecystostomy tube placement on 11/22/2018. Patient recently returned for fluoroscopic guided exchange and repositioning on 12/18/2018 secondary to inverted retraction of the existing cholecystostomy tube and presents today for the same reason. EXAM: FLUOROSCOPIC GUIDED CHOLECYSTOSTOMY TUBE EXCHANGE AND UP SIZING COMPARISON:  Image guided cholecystostomy tube placement-11/22/2018; fluoroscopic guided cholecystostomy tube exchange replacement-12/18/2018 MEDICATIONS: None ANESTHESIA/SEDATION: None CONTRAST:  79mL OMNIPAQUE IOHEXOL 300 MG/ML SOLN - administered into the gallbladder fossa. FLUOROSCOPY TIME:  3 minutes, 42 seconds (417 mGy) COMPLICATIONS: None immediate. PROCEDURE: Informed written consent was obtained from the patient's wife after a discussion of the risks, benefits and alternatives to treatment. Questions regarding the procedure were encouraged and answered. A timeout was performed prior to the initiation of the procedure. The external portion of the existing cholecystostomy tube as well as the surrounding skin was prepped and draped in usual sterile fashion. Preprocedural spot fluoroscopic image was obtained of the right upper  abdominal quadrant demonstrating retraction of the cholecystostomy tube with end coiled and locked peripheral to the expected location of the gallbladder lumen. Contrast was injected via the existing cholecystostomy tube  however nearly all the injected contrast reflux along the cholecystostomy track to the skin entrance site. As such, the external portion of the existing malposition cholecystostomy tube was cut and the tube was removed intact. The entrance site of the prior cholecystostomy tube was cannulated with a Christmas tree adapter and contrast injection was performed demonstrating patency of the cholecystostomy track with opacification of the gallbladder lumen. With the use of a regular glidewire, a Kumpe catheter was advanced through the serpiginous cholecystostomy track to the level of the gallbladder lumen. Contrast injection confirmed appropriate positioning. Next, over a short Amplatz wire, the existing Kumpe catheter was exchanged for a new slightly larger, now 27 [place], cholecystostomy tube with end ultimately coiled and locked within the gallbladder lumen. Limited contrast injection confirmed appropriate position functionality of the new slightly larger cholecystostomy tube. The external portion of the cholecystostomy tube was secured at the skin entrance site within interrupted suture and a Stat Lock device. Cholecystostomy tube was reconnected to a gravity bag. A dressing was placed. The patient tolerated the procedure well without immediate postprocedural complication. IMPRESSION: Successful fluoroscopic guided replacement and up sizing of now 30 French cholecystostomy tube. Electronically Signed   By: Sandi Mariscal M.D.   On: 01/05/2019 15:02   Ir Exchange Biliary Drain  Result Date: 12/18/2018 INDICATION: Chronic cholecystostomy, catheter retracted and occluded EXAM: FLUOROSCOPIC EXCHANGE OF THE PERCUTANEOUS CHOLECYSTOSTOMY MEDICATIONS: 1% lidocaine local ANESTHESIA/SEDATION: Moderate Sedation Time: None. The patient's level of consciousness and vital signs were monitored continuously by radiology nursing throughout the procedure under my direct supervision. FLUOROSCOPY TIME:  Fluoroscopy Time: 1 minutes 30  seconds (6.2 mGy). COMPLICATIONS: None immediate. PROCEDURE: Informed written consent was obtained from the patient's family after a thorough discussion of the procedural risks, benefits and alternatives. All questions were addressed. Maximal Sterile Barrier Technique was utilized including caps, mask, sterile gowns, sterile gloves, sterile drape, hand hygiene and skin antiseptic. A timeout was performed prior to the initiation of the procedure. Under sterile conditions and local anesthesia, the existing cholecystostomy was injected with contrast. Catheter is partially occluded. Several gallstones noted. Cystic duct remains occluded. Catheter was successfully cut and exchanged for a new 10 French catheter over the Amplatz guidewire. Drain catheter position readvanced within the gallbladder proximally. Images obtained for documentation. Catheter secured with Prolene suture and a StatLock device. Sterile dressing applied. Gravity drainage bag connected. No immediate complication. Patient tolerated the procedure well. IMPRESSION: Successful fluoroscopic exchange of the 10 [place] cholecystostomy Electronically Signed   By: Jerilynn Mages.  Shick M.D.   On: 12/18/2018 09:49    Assessment/Plan 1. Cholangitis S/P bile drain with recent exchange 3/24.  Output is decreased but still present. No pain or fever. Eating well. Continue drain care and follow up with surgery when possible. ?if he will be a surgical candidate. Possibly we should consider comfort care in the future given his advanced dementia, multiple health problems, etc.   2. Chronic systolic CHF (congestive heart failure) ([place]) Appears compensated at this time, not currently on diuretic therapy  3. Severe aortic stenosis S/P TAVR  4. Ventricular tachycardia (HCC) Continue metoprolol and amiodarone No longer with ICD due to previous infection  5. Vascular dementia without behavioral disturbance (HCC) Severe stage, doing well with transition to skilled care.     6. Slow transit constipation Reduce miralax  to 1/2 cap per day due to frequent bowel movements   Family/ staff Communication: staff  Labs/tests ordered:  Will need f/u labs in the near future but holding off at this time due to Covid 19 and pt is doing well/asymptomatic

## 2019-01-10 LAB — HEPATIC FUNCTION PANEL
ALK PHOS: 106 (ref 25–125)
ALT: 12 (ref 10–40)
AST: 13 — AB (ref 14–40)
Bilirubin, Total: 0.7

## 2019-01-10 LAB — CBC AND DIFFERENTIAL
HCT: 36 — AB (ref 41–53)
Hemoglobin: 12.5 — AB (ref 13.5–17.5)
Platelets: 154 (ref 150–399)
WBC: 5

## 2019-01-10 LAB — BASIC METABOLIC PANEL
BUN: 26 — AB (ref 4–21)
Creatinine: 1.1 (ref 0.6–1.3)
Glucose: 125
Potassium: 3.8 (ref 3.4–5.3)
SODIUM: 132 — AB (ref 137–147)

## 2019-01-11 ENCOUNTER — Encounter: Payer: Self-pay | Admitting: Adult Health

## 2019-01-11 ENCOUNTER — Non-Acute Institutional Stay (SKILLED_NURSING_FACILITY): Payer: Medicare Other | Admitting: Adult Health

## 2019-01-11 DIAGNOSIS — K8309 Other cholangitis: Secondary | ICD-10-CM

## 2019-01-11 DIAGNOSIS — E860 Dehydration: Secondary | ICD-10-CM | POA: Diagnosis not present

## 2019-01-11 DIAGNOSIS — F0150 Vascular dementia without behavioral disturbance: Secondary | ICD-10-CM

## 2019-01-11 DIAGNOSIS — D649 Anemia, unspecified: Secondary | ICD-10-CM | POA: Diagnosis not present

## 2019-01-11 DIAGNOSIS — R1084 Generalized abdominal pain: Secondary | ICD-10-CM

## 2019-01-11 DIAGNOSIS — I479 Paroxysmal tachycardia, unspecified: Secondary | ICD-10-CM | POA: Diagnosis not present

## 2019-01-11 NOTE — Progress Notes (Signed)
This encounter was created in error - please disregard.

## 2019-01-11 NOTE — Progress Notes (Signed)
Location:  Occupational psychologist of Service:  SNF (31) Provider:   Cindi Carbon, [place] [phone]   Tisovec, Fransico Him, MD  Patient Care Team: Tisovec, Fransico Him, MD as PCP - General (Internal Medicine) Sanda Klein, MD as PCP - Cardiology (Cardiology) Carolan Clines, MD (Inactive) as Consulting Physician (Urology)  Extended Emergency Contact Information Primary Emergency Contact: [facility] Address: [address]          [place], [place] [address] [name] of [place] Phone: [phone] Mobile Phone: [phone] Relation: Spouse Secondary Emergency Contact: Michail Sermon, [place] of [place] Phone: [phone] Mobile Phone: [phone] Relation: Daughter  Code Status:  Full code Goals of care: Advanced Directive information Advanced Directives 12/08/2018  Does Patient Have a Medical Advance Directive? No  Type of Advance Directive -  Does patient want to make changes to medical advance directive? -  Copy of [place] in Chart? -  Would patient like information on creating a medical advance directive? No - Patient declined  Pre-existing out of facility DNR order (yellow form or pink MOST form) -     Chief Complaint  Patient presents with   Acute Visit    not feeling well, abd pain    HPI:  Pt is a 85 y.o. male seen today for an acute visit for not feeling well and abd pain. The symptoms presented one day ago. He has a hx of cholangitis with sepsis and has a biliary drain. He was supposed to f/u with surgery but the apt was canceled. He was treated with Cipro 3/7-3/14/20 with a biliary drain culture showing >100,000 colonies of enterobacter cloacae.  He not eat well yesterday but today he ate 100%. The oncall provider was notified on 3/29 due to the above complaint and a CBC and CMP were ordered which was normal.  He now denies any pain.  He is not  having any nausea, vomiting, etc. His biliary drainage is draining well with brownish drainage per the nurse. The nurse feels he is more withdrawn and eating less and feels like he is "giving up".  He avoid eye contact at times and has little motivation.    Past Medical History:  Diagnosis Date   AAA (abdominal aortic aneurysm) ([place])    7/13 3.8cm   Arthritis    "joints" (01/11/2014)   Asthma    "seasonal; some foods"    Chronic systolic CHF (congestive heart failure) (HCC)    Dementia (HCC)    HLD (hyperlipidemia)    HOH (hard of hearing)    Lumbar vertebral fracture (HCC)    L1- 04/11/2014    Prostate cancer (HCC)    S/P ICD (internal cardiac defibrillator) procedure, 01/11/14 removal of ERI gen and placement of Medtronic Evera XT VR & NEW Right ventricular lead Medtronic 01/12/2014   S/P TAVR (transcatheter aortic valve replacement)    Edwards Sapien 3 THV (size 29 mm, model # [id number], serial # [id number])   Severe aortic stenosis    Sleep apnea    "lost 60# & don't have it anymore" (01/11/2014)   Ventricular tachycardia [facility])    Past Surgical History:  Procedure Laterality Date   CARDIAC CATHETERIZATION  08/20/2004   noncritical CAD,mild global hypokinesis, EF 50%   CARDIAC DEFIBRILLATOR PLACEMENT  08/23/2004   Medtronic   CATARACT EXTRACTION W/ INTRAOCULAR LENS IMPLANT Right    COLECTOMY  1990's  CRYOABLATION N/A 02/24/2014   Procedure: CRYO ABLATION PROSTATE;  Surgeon: Ailene Rud, MD;  Location: [facility] ORS;  Service: Urology;  Laterality: N/A;   HERNIA REPAIR     "abdomen; from colon OR"   ICD LEAD REMOVAL N/A 09/15/2018   Procedure: ICD LEAD REMOVAL EXTRACTION;  Surgeon: Evans Lance, MD;  Location: [facility] OR;  Service: Cardiovascular;  Laterality: N/A;  DR. BARTLE TO BACK UP   IMPLANTABLE CARDIOVERTER DEFIBRILLATOR (ICD) GENERATOR CHANGE N/A 01/11/2014   Procedure: ICD GENERATOR CHANGE;  Surgeon: Sanda Klein, MD;  Location: [place] CATH LAB;  Service:  Cardiovascular;  Laterality: N/A;   IR EXCHANGE BILIARY DRAIN  12/18/2018   IR EXCHANGE BILIARY DRAIN  01/05/2019   IR PERC CHOLECYSTOSTOMY  11/22/2018   LEAD REVISION N/A 01/11/2014   Procedure: LEAD REVISION;  Surgeon: Sanda Klein, MD;  Location: [place] CATH LAB;  Service: Cardiovascular;  Laterality: N/A;   NM MYOCAR PERF WALL MOTION  01/29/2012   abnormal c/o infarct/scar,no ischemia present   PROSTATE BIOPSY N/A 11/22/2013   Procedure: PROSTATE BIOPSY AND ULTRASOUND;  Surgeon: Ailene Rud, MD;  Location: [facility] ORS;  Service: Urology;  Laterality: N/A;   RIGHT/LEFT HEART CATH AND CORONARY ANGIOGRAPHY N/A 07/06/2018   Procedure: RIGHT/LEFT HEART CATH AND CORONARY ANGIOGRAPHY;  Surgeon: Troy Sine, MD;  Location: [place] CV LAB;  Service: Cardiovascular;  Laterality: N/A;   TEE WITHOUT CARDIOVERSION N/A 08/18/2018   Procedure: TRANSESOPHAGEAL ECHOCARDIOGRAM (TEE);  Surgeon: Burnell Blanks, MD;  Location: [place];  Service: Open Heart Surgery;  Laterality: N/A;   TEE WITHOUT CARDIOVERSION N/A 09/08/2018   Procedure: TRANSESOPHAGEAL ECHOCARDIOGRAM (TEE);  Surgeon: Lelon Perla, MD;  Location: [facility] ENDOSCOPY;  Service: Cardiovascular;  Laterality: N/A;   TEE WITHOUT CARDIOVERSION N/A 09/15/2018   Procedure: TRANSESOPHAGEAL ECHOCARDIOGRAM (TEE);  Surgeon: Evans Lance, MD;  Location: [facility] OR;  Service: Cardiovascular;  Laterality: N/A;   TEE WITHOUT CARDIOVERSION N/A 11/26/2018   Procedure: TRANSESOPHAGEAL ECHOCARDIOGRAM (TEE);  Surgeon: Jerline Pain, MD;  Location: [facility] ENDOSCOPY;  Service: Cardiovascular;  Laterality: N/A;   TRANSCATHETER AORTIC VALVE REPLACEMENT, TRANSFEMORAL  08/18/2018   TRANSCATHETER AORTIC VALVE REPLACEMENT, TRANSFEMORAL N/A 08/18/2018   Procedure: TRANSCATHETER AORTIC VALVE REPLACEMENT, TRANSFEMORAL using an Edwards 61m Aortic Valve;  Surgeon: MBurnell Blanks MD;  Location: [place]  Service: Open Heart Surgery;  Laterality: N/A;     Allergies  Allergen Reactions   Crestor [Rosuvastatin] Other (See Comments)    Hurts muscles   Lipitor [Atorvastatin] Other (See Comments)    Hurts stomach   Shrimp [Shellfish Allergy] Other (See Comments)    On MAR    Outpatient Encounter Medications as of 01/11/2019  Medication Sig   acetaminophen (TYLENOL) 500 MG tablet Take 500 mg by mouth every 6 (six) hours as needed (for pain).    amiodarone (PACERONE) 200 MG tablet Take 0.5 tablets (100 mg total) by mouth daily.   aspirin 81 MG chewable tablet Chew 1 tablet (81 mg total) by mouth daily.   bisacodyl (DULCOLAX) 10 MG suppository Place 1 suppository (10 mg total) rectally daily as needed for moderate constipation.   cholecalciferol (VITAMIN D) 1000 units tablet Take 2,000 Units by mouth at bedtime.   clopidogrel (PLAVIX) 75 MG tablet Take 1 tablet (75 mg total) by mouth daily with breakfast.   metoprolol succinate (TOPROL-XL) 25 MG 24 hr tablet Take 1 tablet (25 mg total) by mouth daily. Take with or immediately following a meal.   polyethylene glycol (MIRALAX /  GLYCOLAX) packet Take 17 g by mouth every evening.   triamcinolone (KENALOG) 0.025 % ointment Apply 1 application topically 2 (two) times daily.   No facility-administered encounter medications on file as of 01/11/2019.     Review of Systems  Unable to perform ROS: Dementia    Immunization History  Administered Date(s) Administered   Influenza, High Dose Seasonal PF 08/13/2017, 07/03/2018   Influenza,inj,Quad PF,6+ Mos 08/18/2013, 07/23/2016   Pneumococcal Conjugate-13 08/14/2013   Pneumococcal Polysaccharide-23 04/06/2009   Pertinent  Health Maintenance Due  Topic Date Due   INFLUENZA VACCINE  Completed   PNA vac Low Risk Adult  Completed   Fall Risk  12/03/2018  Falls in the past year? 0   Functional Status Survey:    Vitals:   01/11/19 1449  BP: 106/68  Pulse: 96  Resp: 19  Temp: 97.7 F (36.5 C)  SpO2: 97%   There is no  height or weight on file to calculate BMI. Physical Exam Constitutional:      General: He is not in acute distress.    Appearance: Normal appearance. He is not diaphoretic.  HENT:     Head: Normocephalic and atraumatic.     Nose: No congestion.     Mouth/Throat:     Pharynx: Oropharynx is clear.  Eyes:     General:        Right eye: No discharge.        Left eye: No discharge.     Conjunctiva/sclera: Conjunctivae normal.     Pupils: Pupils are equal, round, and reactive to light.  Neck:     Thyroid: No thyromegaly.     Vascular: No JVD.     Trachea: No tracheal deviation.  Cardiovascular:     Rate and Rhythm: Normal rate and regular rhythm.     Heart sounds: No murmur.  Pulmonary:     Effort: Pulmonary effort is normal. No respiratory distress.     Breath sounds: Normal breath sounds. No wheezing.  Abdominal:     General: Bowel sounds are normal. There is no distension.     Palpations: Abdomen is soft.     Tenderness: There is no abdominal tenderness.  Lymphadenopathy:     Cervical: No cervical adenopathy.  Skin:    General: Skin is warm and dry.     Findings: Bruising (BUE) present.  Neurological:     Mental Status: He is alert.     Comments: Oriented to self only  Psychiatric:     Comments: flat     Labs reviewed: Recent Labs    11/25/18 0357 11/26/18 0430 11/27/18 0404  12/12/18 1535 12/15/18 01/10/19  NA 139 138  --    < > 134* 133* 132*  K 3.7 3.1*  --    < > 3.9 4.2 3.8  CL 108 109  --   --  99  --   --   CO2 25 24  --   --  23  --   --   GLUCOSE 135* 133*  --   --  160*  --   --   BUN 14 12  --    < > 18 17 26*  CREATININE 0.88 0.81  --    < > 0.94 0.9 1.1  CALCIUM 7.6* 7.6*  --   --  8.9  --   --   MG 2.0 2.0 2.0  --   --   --   --   PHOS 2.2* 2.7 3.3  --   --   --   --    < > =  values in this interval not displayed.   Recent Labs    11/24/18 0423 11/25/18 0357 12/12/18 1535 01/10/19  AST 54* 65* 24 13*  ALT 48* 58* 25 12  ALKPHOS 76 86 104  106  BILITOT 0.7 1.2 0.8  --   PROT 4.8* 5.2* 7.4  --   ALBUMIN 1.5* 1.7* 2.7*  --    Recent Labs    10/28/18 1629 11/21/18 0521  11/25/18 0357 11/26/18 0430  12/12/18 1535 12/15/18 01/10/19  WBC 6.3 14.1*   < > 5.7 5.8   < > 8.3 7.0 5.0  NEUTROABS 4,082 12.6*  --   --   --   --  5.4  --   --   HGB 11.1* 12.3*   < > 10.2* 9.9*   < > 12.5* 12.0* 12.5*  HCT 32.8* 36.3*   < > 32.9* 30.4*   < > 37.7* 34* 36*  MCV 96.2 93.3   < > 97.6 93.3  --  91.5  --   --   PLT 131* 163   < > 123* 137*   < > 166 165 154   < > = values in this interval not displayed.   Lab Results  Component Value Date   TSH 1.75 02/05/2017   Lab Results  Component Value Date   HGBA1C 5.2 08/13/2018   No results found for: CHOL, HDL, LDLCALC, LDLDIRECT, TRIG, CHOLHDL  Significant Diagnostic Results in last 30 days:  Ct Head Wo Contrast  Result Date: 12/12/2018 CLINICAL DATA:  Altered level of consciousness.  Unexplained. EXAM: CT HEAD WITHOUT CONTRAST TECHNIQUE: Contiguous axial images were obtained from the base of the skull through the vertex without intravenous contrast. COMPARISON:  10/09/2018 FINDINGS: Brain: No evidence of acute infarction, hemorrhage, hydrocephalus, extra-axial collection or mass lesion/mass effect. There is mild diffuse low-attenuation within the subcortical and periventricular white matter compatible with chronic microvascular disease. Prominence of the sulci and ventricles are identified compatible with brain atrophy. Vascular: No hyperdense vessel or unexpected calcification. Skull: Normal. Negative for fracture or focal lesion. Sinuses/Orbits: No acute finding. Other: None IMPRESSION: 1. No acute intracranial abnormality. 2. Chronic small vessel ischemic change and brain atrophy. Electronically Signed   By: Kerby Moors M.D.   On: 12/12/2018 17:22   Dg Chest Port 1 View  Result Date: 12/12/2018 CLINICAL DATA:  ALOC today. Hx of dementia, AAA, s/p TAVR, CHF, asthma. EXAM: PORTABLE CHEST  1 VIEW COMPARISON:  11/23/2018 FINDINGS: The heart is enlarged. Status post TAVR. The aorta is partially calcified. Shallow lung inflation. Minimal bilateral LOWER lobe atelectasis or scarring. The lungs are free of focal consolidations. No pulmonary edema. IMPRESSION: 1. Cardiomegaly without pulmonary edema. 2. Bilateral LOWER lobe atelectasis or scarring. 3.  Aortic atherosclerosis.  (ICD10-I70.0) Electronically Signed   By: Nolon Nations M.D.   On: 12/12/2018 16:03   Ir Exchange Biliary Drain  Result Date: 01/05/2019 INDICATION: History of acute cholecystitis, poor operative candidate, post image guided cholecystostomy tube placement on 11/22/2018. Patient recently returned for fluoroscopic guided exchange and repositioning on 12/18/2018 secondary to inverted retraction of the existing cholecystostomy tube and presents today for the same reason. EXAM: FLUOROSCOPIC GUIDED CHOLECYSTOSTOMY TUBE EXCHANGE AND UP SIZING COMPARISON:  Image guided cholecystostomy tube placement-11/22/2018; fluoroscopic guided cholecystostomy tube exchange replacement-12/18/2018 MEDICATIONS: None ANESTHESIA/SEDATION: None CONTRAST:  63m OMNIPAQUE IOHEXOL 300 MG/ML SOLN - administered into the gallbladder fossa. FLUOROSCOPY TIME:  3 minutes, 42 seconds (1355mGy) COMPLICATIONS: None immediate. PROCEDURE: Informed written consent was obtained  from the patient's wife after a discussion of the risks, benefits and alternatives to treatment. Questions regarding the procedure were encouraged and answered. A timeout was performed prior to the initiation of the procedure. The external portion of the existing cholecystostomy tube as well as the surrounding skin was prepped and draped in usual sterile fashion. Preprocedural spot fluoroscopic image was obtained of the right upper abdominal quadrant demonstrating retraction of the cholecystostomy tube with end coiled and locked peripheral to the expected location of the gallbladder lumen.  Contrast was injected via the existing cholecystostomy tube however nearly all the injected contrast reflux along the cholecystostomy track to the skin entrance site. As such, the external portion of the existing malposition cholecystostomy tube was cut and the tube was removed intact. The entrance site of the prior cholecystostomy tube was cannulated with a Christmas tree adapter and contrast injection was performed demonstrating patency of the cholecystostomy track with opacification of the gallbladder lumen. With the use of a regular glidewire, a Kumpe catheter was advanced through the serpiginous cholecystostomy track to the level of the gallbladder lumen. Contrast injection confirmed appropriate positioning. Next, over a short Amplatz wire, the existing Kumpe catheter was exchanged for a new slightly larger, now 77 [place], cholecystostomy tube with end ultimately coiled and locked within the gallbladder lumen. Limited contrast injection confirmed appropriate position functionality of the new slightly larger cholecystostomy tube. The external portion of the cholecystostomy tube was secured at the skin entrance site within interrupted suture and a Stat Lock device. Cholecystostomy tube was reconnected to a gravity bag. A dressing was placed. The patient tolerated the procedure well without immediate postprocedural complication. IMPRESSION: Successful fluoroscopic guided replacement and up sizing of now 31 French cholecystostomy tube. Electronically Signed   By: Sandi Mariscal M.D.   On: 01/05/2019 15:02   Ir Exchange Biliary Drain  Result Date: 12/18/2018 INDICATION: Chronic cholecystostomy, catheter retracted and occluded EXAM: FLUOROSCOPIC EXCHANGE OF THE PERCUTANEOUS CHOLECYSTOSTOMY MEDICATIONS: 1% lidocaine local ANESTHESIA/SEDATION: Moderate Sedation Time: None. The patient's level of consciousness and vital signs were monitored continuously by radiology nursing throughout the procedure under my direct  supervision. FLUOROSCOPY TIME:  Fluoroscopy Time: 1 minutes 30 seconds (6.2 mGy). COMPLICATIONS: None immediate. PROCEDURE: Informed written consent was obtained from the patient's family after a thorough discussion of the procedural risks, benefits and alternatives. All questions were addressed. Maximal Sterile Barrier Technique was utilized including caps, mask, sterile gowns, sterile gloves, sterile drape, hand hygiene and skin antiseptic. A timeout was performed prior to the initiation of the procedure. Under sterile conditions and local anesthesia, the existing cholecystostomy was injected with contrast. Catheter is partially occluded. Several gallstones noted. Cystic duct remains occluded. Catheter was successfully cut and exchanged for a new 10 French catheter over the Amplatz guidewire. Drain catheter position readvanced within the gallbladder proximally. Images obtained for documentation. Catheter secured with Prolene suture and a StatLock device. Sterile dressing applied. Gravity drainage bag connected. No immediate complication. Patient tolerated the procedure well. IMPRESSION: Successful fluoroscopic exchange of the 10 [place] cholecystostomy Electronically Signed   By: Jerilynn Mages.  Shick M.D.   On: 12/18/2018 09:49    Assessment/Plan  1. Generalized abdominal pain Seems to have resolved at this time but given his history of frequent episodes of sepsis with little warning will check ESR and CRP  2. Cholangitis No signs of acute distress/toxicity during this visit. F/U with surgery when able (on hold due to Covid 19) Continue drain care and monitoring  3. Dehydration Mild with  BUN 26 and Na 132 encourage fluids with gatorade  4. Vascular dementia without behavioral disturbance (HCC) Severe in nature now with more withdrawn mood Consider depression or progression of disease Await labs before treating.    Family/ staff Communication: staff  Labs/tests ordered: ESR CRP added

## 2019-01-12 ENCOUNTER — Non-Acute Institutional Stay (SKILLED_NURSING_FACILITY): Payer: Medicare Other | Admitting: Internal Medicine

## 2019-01-12 ENCOUNTER — Ambulatory Visit: Payer: Medicare Other | Admitting: Infectious Disease

## 2019-01-12 ENCOUNTER — Encounter: Payer: Self-pay | Admitting: Internal Medicine

## 2019-01-12 DIAGNOSIS — R54 Age-related physical debility: Secondary | ICD-10-CM | POA: Diagnosis not present

## 2019-01-12 DIAGNOSIS — R5383 Other fatigue: Secondary | ICD-10-CM | POA: Diagnosis not present

## 2019-01-12 DIAGNOSIS — E7800 Pure hypercholesterolemia, unspecified: Secondary | ICD-10-CM | POA: Diagnosis not present

## 2019-01-12 DIAGNOSIS — K8064 Calculus of gallbladder and bile duct with chronic cholecystitis without obstruction: Secondary | ICD-10-CM

## 2019-01-12 DIAGNOSIS — I2510 Atherosclerotic heart disease of native coronary artery without angina pectoris: Secondary | ICD-10-CM | POA: Diagnosis not present

## 2019-01-12 DIAGNOSIS — D508 Other iron deficiency anemias: Secondary | ICD-10-CM | POA: Diagnosis not present

## 2019-01-12 DIAGNOSIS — I350 Nonrheumatic aortic (valve) stenosis: Secondary | ICD-10-CM | POA: Diagnosis not present

## 2019-01-12 DIAGNOSIS — F0150 Vascular dementia without behavioral disturbance: Secondary | ICD-10-CM | POA: Diagnosis not present

## 2019-01-12 DIAGNOSIS — Z8679 Personal history of other diseases of the circulatory system: Secondary | ICD-10-CM | POA: Diagnosis not present

## 2019-01-12 DIAGNOSIS — M2550 Pain in unspecified joint: Secondary | ICD-10-CM | POA: Diagnosis not present

## 2019-01-12 NOTE — Progress Notes (Signed)
Patient ID: William [name], male   DOB: [birth year], 85 y.o.   MRN: [id number]  Location:  [facility] Room Number: 104 Place of Service:  SNF ([phone]) Provider:   Gayland Curry, DO  Patient Care Team: Gayland Curry, DO as PCP - General (Geriatric Medicine) Sanda Klein, MD as PCP - Cardiology (Cardiology) Carolan Clines, MD (Inactive) as Consulting Physician (Urology)  Extended Emergency Contact Information Primary Emergency Contact: Pettaway,[name] Address: [address]          [place], [place] [address] [name] of [place] Phone: [phone] Mobile Phone: [phone] Relation: Spouse Secondary Emergency Contact: Michail Sermon, [name] of [place] Phone: [phone] Mobile Phone: [phone] Relation: Daughter  Code Status:  FULL CODE Goals of care: Advanced Directive information Advanced Directives 12/08/2018  Does Patient Have a Medical Advance Directive? No  Type of Advance Directive -  Does patient want to make changes to medical advance directive? -  Copy of [place] in Chart? -  Would patient like information on creating a medical advance directive? No - Patient declined  Pre-existing out of facility DNR order (yellow form or pink MOST form) -     Chief Complaint  Patient presents with  . Acute Visit    Decreased appetite and blah over weekend; had labs drawn and NP ordered more; staff requested I look at biliary drain site again    HPI:  Pt is a 85 y.o. male who has been with [place] in rehab (or in the hospital) now since 09/22/2018  seen today for an acute visit in his skilled apt for poor appetite, malaise and bulge at biliary drain site.   He was initially admitted for rehab from home where he stayed with his wife and non-clinical caregivers after he acquired sepsis from an infection of his ICD leads thought to be due to a UTI. The ICD was removed.  He's  been treated with IV abx.  He had another episode of sepsis in feb and that time it turned out he had cholecystitis.  He's had a drain in since.  He remains at risk for acute cholecystitis or acute cholangitis.  He was sent out another time 2/25 for lethargy and tachycardia and found to have another UTI in the ED reportedly, but had clearly had foul drainage from his biliary drain.  Over this past weekend, he had increased lethargy and malaise with poor appetite and some abdominal pain yesterday.  NP saw him and labs were reviewed which showed no leukocytosis, mild anemia, mild hyponatremia (but stable), and improved transaminases.  She added on a CRP and ESR which returned today:  CRP was 1.61 down from >15.41 on 3/3 and ESR 60 down from 118 3/3.    When I saw him, the nurse was changing his biliary drain dressing and there was a bulge moving around under the skin of concern.  There's chronic erythema just at the entrance to the skin.  No discharge around the drain.  Draining well--dark yellowish brown thick fluid.   He had no abdominal tenderness.  He was pale and less sociable--not smiling or talking much.  Earlier this am, he had been "chipper" with nursing.  Past Medical History:  Diagnosis Date  . AAA (abdominal aortic aneurysm) ([place])    7/13 3.8cm  . Arthritis    "joints" (01/11/2014)  . Asthma    "seasonal; some foods"   .  Chronic systolic CHF (congestive heart failure) ([place])   . Dementia ([facility])   . HLD (hyperlipidemia)   . HOH (hard of hearing)   . Lumbar vertebral fracture ([facility])    L1- 04/11/2014   . Prostate cancer ([place] [facility])   . S/P ICD (internal cardiac defibrillator) procedure, 01/11/14 removal of ERI gen and placement of Medtronic Evera XT VR & NEW Right ventricular lead Medtronic 01/12/2014  . S/P TAVR (transcatheter aortic valve replacement)    Edwards Sapien 3 THV (size 29 mm, model # B6411258, serial # [id number])  . Severe aortic stenosis   . Sleep apnea    "lost 60# & don't have it  anymore" (01/11/2014)  . Ventricular tachycardia [facility])    Past Surgical History:  Procedure Laterality Date  . CARDIAC CATHETERIZATION  08/20/2004   noncritical CAD,mild global hypokinesis, EF 50%  . CARDIAC DEFIBRILLATOR PLACEMENT  08/23/2004   Medtronic  . CATARACT EXTRACTION W/ INTRAOCULAR LENS IMPLANT Right   . COLECTOMY  1990's  . CRYOABLATION N/A 02/24/2014   Procedure: CRYO ABLATION PROSTATE;  Surgeon: Ailene Rud, MD;  Location: [facility] ORS;  Service: Urology;  Laterality: N/A;  . HERNIA REPAIR     "abdomen; from colon OR"  . ICD LEAD REMOVAL N/A 09/15/2018   Procedure: ICD LEAD REMOVAL EXTRACTION;  Surgeon: Evans Lance, MD;  [facility]: [facility];  Service: Cardiovascular;  Laterality: N/A;  DR. BARTLE TO BACK UP  . IMPLANTABLE CARDIOVERTER DEFIBRILLATOR (ICD) GENERATOR CHANGE N/A 01/11/2014   Procedure: ICD GENERATOR CHANGE;  Surgeon: Sanda Klein, MD;  Location: [facility] CATH LAB;  Service: Cardiovascular;  Laterality: N/A;  . IR EXCHANGE BILIARY DRAIN  12/18/2018  . IR EXCHANGE BILIARY DRAIN  01/05/2019  . IR PERC CHOLECYSTOSTOMY  11/22/2018  . LEAD REVISION N/A 01/11/2014   Procedure: LEAD REVISION;  Surgeon: Sanda Klein, MD;  Location: [facility] CATH LAB;  Service: Cardiovascular;  Laterality: N/A;  . NM MYOCAR PERF WALL MOTION  01/29/2012   abnormal c/o infarct/scar,no ischemia present  . PROSTATE BIOPSY N/A 11/22/2013   Procedure: PROSTATE BIOPSY AND ULTRASOUND;  Surgeon: Ailene Rud, MD;  Location: [facility]S;  Service: Urology;  Laterality: N/A;  . RIGHT/LEFT HEART CATH AND CORONARY ANGIOGRAPHY N/A 07/06/2018   Procedure: RIGHT/LEFT HEART CATH AND CORONARY ANGIOGRAPHY;  Surgeon: Troy Sine, MD;  Location: [facility] CV LAB;  Service: Cardiovascular;  Laterality: N/A;  . TEE WITHOUT CARDIOVERSION N/A 08/18/2018   Procedure: TRANSESOPHAGEAL ECHOCARDIOGRAM (TEE);  Surgeon: Burnell Blanks, MD;  Location: [facility];  Service: Open Heart Surgery;  Laterality: N/A;  . TEE WITHOUT  CARDIOVERSION N/A 09/08/2018   Procedure: TRANSESOPHAGEAL ECHOCARDIOGRAM (TEE);  Surgeon: Lelon Perla, MD;  Location: [facility] ENDOSCOPY;  Service: Cardiovascular;  Laterality: N/A;  . TEE WITHOUT CARDIOVERSION N/A 09/15/2018   Procedure: TRANSESOPHAGEAL ECHOCARDIOGRAM (TEE);  Surgeon: Evans Lance, MD;  Location[facility];  Service: Cardiovascular;  Laterality: N/A;  . TEE WITHOUT CARDIOVERSION N/A 11/26/2018   Procedure: TRANSESOPHAGEAL ECHOCARDIOGRAM (TEE);  Surgeon: Jerline Pain, MD;  Location: [facility] ENDOSCOPY;  Service: Cardiovascular;  Laterality: N/A;  . TRANSCATHETER AORTIC VALVE REPLACEMENT, TRANSFEMORAL  08/18/2018  . TRANSCATHETER AORTIC VALVE REPLACEMENT, TRANSFEMORAL N/A 08/18/2018   Procedure: TRANSCATHETER AORTIC VALVE REPLACEMENT, TRANSFEMORAL using an Edwards 73m Aortic Valve;  Surgeon: MBurnell Blanks MD;  Location: [facility]  Service: Open Heart Surgery;  Laterality: N/A;    Allergies  Allergen Reactions  . Crestor [Rosuvastatin] Other (See Comments)    Hurts muscles  . Lipitor [  Atorvastatin] Other (See Comments)    Hurts stomach  . Shrimp [Shellfish Allergy] Other (See Comments)    On MAR    Outpatient Encounter Medications as of 01/12/2019  Medication Sig  . acetaminophen (TYLENOL) 500 MG tablet Take 500 mg by mouth every 6 (six) hours as needed (for pain).   Marland Kitchen amiodarone (PACERONE) 200 MG tablet Take 0.5 tablets (100 mg total) by mouth daily.  Marland Kitchen aspirin 81 MG chewable tablet Chew 1 tablet (81 mg total) by mouth daily.  . bisacodyl (DULCOLAX) 10 MG suppository Place 1 suppository (10 mg total) rectally daily as needed for moderate constipation.  . cholecalciferol (VITAMIN D) 1000 units tablet Take 2,000 Units by mouth at bedtime.  . clopidogrel (PLAVIX) 75 MG tablet Take 1 tablet (75 mg total) by mouth daily with breakfast.  . metoprolol succinate (TOPROL-XL) 25 MG 24 hr tablet Take 1 tablet (25 mg total) by mouth daily. Take with or immediately following a meal.    . polyethylene glycol (MIRALAX / GLYCOLAX) packet Take 17 g by mouth every evening.  . triamcinolone (KENALOG) 0.025 % ointment Apply 1 application topically 2 (two) times daily.   No facility-administered encounter medications on file as of 01/12/2019.     Review of Systems  Constitutional: Positive for appetite change, fatigue and unexpected weight change. Negative for activity change, chills and fever.       Weight down 14 lbs in 6 weeks  HENT: Negative for rhinorrhea.   Eyes: Negative for visual disturbance.  Respiratory: Negative for cough and shortness of breath.   Cardiovascular: Negative for chest pain, palpitations and leg swelling.       Edema improved  Gastrointestinal: Negative for abdominal pain, constipation, diarrhea and nausea.       Biliary drain in place, no abdominal pain today  Genitourinary: Negative for dysuria.  Musculoskeletal: Positive for gait problem. Negative for arthralgias and joint swelling.  Skin: Positive for pallor.  Neurological: Positive for weakness. Negative for dizziness.  Hematological: Bruises/bleeds easily.  Psychiatric/Behavioral: Positive for confusion. Negative for agitation and behavioral problems. The patient is not nervous/anxious.        Affect flat today    Immunization History  Administered Date(s) Administered  . Influenza, High Dose Seasonal PF 08/13/2017, 07/03/2018  . Influenza,inj,Quad PF,6+ Mos 08/18/2013, 07/23/2016  . Pneumococcal Conjugate-13 08/14/2013  . Pneumococcal Polysaccharide-23 04/06/2009   Pertinent  Health Maintenance Due  Topic Date Due  . INFLUENZA VACCINE  Completed  . PNA vac Low Risk Adult  Completed   Fall Risk  12/03/2018  Falls in the past year? 0   Functional Status Survey:  dependent in adls, even help with feeding at times  Vitals:   01/12/19 1027  BP: 111/75  Pulse: 96  Resp: 20  Temp: 98 F (36.7 C)  TempSrc: Oral  SpO2: 97%  Weight: 203 lb (92.1 kg)  Height: '5\' 10"'  (1.778 m)    Body mass index is 29.13 kg/m. Physical Exam Vitals signs and nursing note reviewed.  Constitutional:      General: He is not in acute distress.    Appearance: He is not ill-appearing or toxic-appearing.     Comments: pale  HENT:     Head: Normocephalic and atraumatic.  Cardiovascular:     Rate and Rhythm: Normal rate and regular rhythm.  Pulmonary:     Effort: Pulmonary effort is normal.     Breath sounds: Normal breath sounds.  Abdominal:     General: Bowel sounds are  normal. There is no distension.     Palpations: Abdomen is soft.     Tenderness: There is no abdominal tenderness. There is no guarding or rebound.     Comments: Biliary drain in place--nursing resecured with stat lock, dressing and tape after changing dressing; draining dark yellow to brain drainage that is translucent, not as opaque as it was a few weeks ago and flowing better  Musculoskeletal: Normal range of motion.     Comments: 1+ edema at best now, compression socks in place  Skin:    General: Skin is warm and dry.     Coloration: Skin is pale.  Neurological:     General: No focal deficit present.     Mental Status: He is alert. He is disoriented.     Motor: Weakness present.  Psychiatric:     Comments: Flat affect today--usually smiles and laughs with me; lacks motivation (ongoing)     Labs reviewed: Recent Labs    11/25/18 0357 11/26/18 0430 11/27/18 0404  12/12/18 1535 12/15/18 01/10/19  NA 139 138  --    < > 134* 133* 132*  K 3.7 3.1*  --    < > 3.9 4.2 3.8  CL 108 109  --   --  99  --   --   CO2 25 24  --   --  23  --   --   GLUCOSE 135* 133*  --   --  160*  --   --   BUN 14 12  --    < > 18 17 26*  CREATININE 0.88 0.81  --    < > 0.94 0.9 1.1  CALCIUM 7.6* 7.6*  --   --  8.9  --   --   MG 2.0 2.0 2.0  --   --   --   --   PHOS 2.2* 2.7 3.3  --   --   --   --    < > = values in this interval not displayed.   Recent Labs    11/24/18 0423 11/25/18 0357 12/12/18 1535 01/10/19  AST  54* 65* 24 13*  ALT 48* 58* 25 12  ALKPHOS 76 86 104 106  BILITOT 0.7 1.2 0.8  --   PROT 4.8* 5.2* 7.4  --   ALBUMIN 1.5* 1.7* 2.7*  --    Recent Labs    10/28/18 1629 11/21/18 0521  11/25/18 0357 11/26/18 0430  12/12/18 1535 12/15/18 01/10/19  WBC 6.3 14.1*   < > 5.7 5.8   < > 8.3 7.0 5.0  NEUTROABS 4,082 12.6*  --   --   --   --  5.4  --   --   HGB 11.1* 12.3*   < > 10.2* 9.9*   < > 12.5* 12.0* 12.5*  HCT 32.8* 36.3*   < > 32.9* 30.4*   < > 37.7* 34* 36*  MCV 96.2 93.3   < > 97.6 93.3  --  91.5  --   --   PLT 131* 163   < > 123* 137*   < > 166 165 154   < > = values in this interval not displayed.   Lab Results  Component Value Date   TSH 1.75 02/05/2017   Lab Results  Component Value Date   HGBA1C 5.2 08/13/2018   No results found for: CHOL, HDL, LDLCALC, LDLDIRECT, TRIG, CHOLHDL  Significant Diagnostic Results in last 30 days:  Ir Exchange Biliary Drain  Result Date: 01/05/2019 INDICATION: History of acute cholecystitis, poor operative candidate, post image guided cholecystostomy tube placement on 11/22/2018. Patient recently returned for fluoroscopic guided exchange and repositioning on 12/18/2018 secondary to inverted retraction of the existing cholecystostomy tube and presents today for the same reason. EXAM: FLUOROSCOPIC GUIDED CHOLECYSTOSTOMY TUBE EXCHANGE AND UP SIZING COMPARISON:  Image guided cholecystostomy tube placement-11/22/2018; fluoroscopic guided cholecystostomy tube exchange replacement-12/18/2018 MEDICATIONS: None ANESTHESIA/SEDATION: None CONTRAST:  79m OMNIPAQUE IOHEXOL 300 MG/ML SOLN - administered into the gallbladder fossa. FLUOROSCOPY TIME:  3 minutes, 42 seconds (1062mGy) COMPLICATIONS: None immediate. PROCEDURE: Informed written consent was obtained from the patient's wife after a discussion of the risks, benefits and alternatives to treatment. Questions regarding the procedure were encouraged and answered. A timeout was performed prior to the  initiation of the procedure. The external portion of the existing cholecystostomy tube as well as the surrounding skin was prepped and draped in usual sterile fashion. Preprocedural spot fluoroscopic image was obtained of the right upper abdominal quadrant demonstrating retraction of the cholecystostomy tube with end coiled and locked peripheral to the expected location of the gallbladder lumen. Contrast was injected via the existing cholecystostomy tube however nearly all the injected contrast reflux along the cholecystostomy track to the skin entrance site. As such, the external portion of the existing malposition cholecystostomy tube was cut and the tube was removed intact. The entrance site of the prior cholecystostomy tube was cannulated with a Christmas tree adapter and contrast injection was performed demonstrating patency of the cholecystostomy track with opacification of the gallbladder lumen. With the use of a regular glidewire, a Kumpe catheter was advanced through the serpiginous cholecystostomy track to the level of the gallbladder lumen. Contrast injection confirmed appropriate positioning. Next, over a short Amplatz wire, the existing Kumpe catheter was exchanged for a new slightly larger, now [place] cholecystostomy tube with end ultimately coiled and locked within the gallbladder lumen. Limited contrast injection confirmed appropriate position functionality of the new slightly larger cholecystostomy tube. The external portion of the cholecystostomy tube was secured at the skin entrance site within interrupted suture and a Stat Lock device. Cholecystostomy tube was reconnected to a gravity bag. A dressing was placed. The patient tolerated the procedure well without immediate postprocedural complication. IMPRESSION: Successful fluoroscopic guided replacement and up sizing of now 120French cholecystostomy tube. Electronically Signed   By: JSandi MariscalM.D.   On: 01/05/2019 15:02   Ir Exchange  Biliary Drain  Result Date: 12/18/2018 INDICATION: Chronic cholecystostomy, catheter retracted and occluded EXAM: FLUOROSCOPIC EXCHANGE OF THE PERCUTANEOUS CHOLECYSTOSTOMY MEDICATIONS: 1% lidocaine local ANESTHESIA/SEDATION: Moderate Sedation Time: None. The patient's level of consciousness and vital signs were monitored continuously by radiology nursing throughout the procedure under my direct supervision. FLUOROSCOPY TIME:  Fluoroscopy Time: 1 minutes 30 seconds (6.2 mGy). COMPLICATIONS: None immediate. PROCEDURE: Informed written consent was obtained from the patient's family after a thorough discussion of the procedural risks, benefits and alternatives. All questions were addressed. Maximal Sterile Barrier Technique was utilized including caps, mask, sterile gowns, sterile gloves, sterile drape, hand hygiene and skin antiseptic. A timeout was performed prior to the initiation of the procedure. Under sterile conditions and local anesthesia, the existing cholecystostomy was injected with contrast. Catheter is partially occluded. Several gallstones noted. Cystic duct remains occluded. Catheter was successfully cut and exchanged for a new 10 French catheter over the Amplatz guidewire. Drain catheter position readvanced within the gallbladder proximally. Images obtained for documentation. Catheter secured with Prolene suture and a StatLock  device. Sterile dressing applied. Gravity drainage bag connected. No immediate complication. Patient tolerated the procedure well. IMPRESSION: Successful fluoroscopic exchange of the 10 [place] cholecystostomy Electronically Signed   By: Jerilynn Mages.  Shick M.D.   On: 12/18/2018 09:49    Assessment/Plan 1. Chronic cholecystitis due to cholelithiasis with choledocholithiasis -drain functioning well at present and no current abdominal pain or signs of acute obstruction based on labs -drain secured better  2. Lethargy -may have a component of depression due to chronic illness and lack  of progress -no clear acute cause of this on labs with improvement in inflammatory markers  3. Age-related physical debility -significant contributor to decline, weight loss, along with his several recent infections, episodes of sepsis and his progressing dementia -not a good candidate for surgeries, ICD replacement  4. Vascular dementia without behavioral disturbance ([place]) -gradually progressing, may also have AD component  5. History of ventricular tachycardia -had defibrillator for this, but had to be removed due to infection -his wife has requested he remain full code   6. Severe aortic stenosis -s/p TAVR, doing ok from that perspective and no signs of infection of the prosthesis on echo last hospitalization  Family/ staff Communication: discussed with snf nurse  Labs/tests ordered:  No new  Adaleen Hulgan L. Vendetta Pittinger, D.O. [facility] [address] [place], [place] [address] Cell Phone (Mon-Fri 8am-5pm):  [phone] On Call:  [phone] & follow prompts after 5pm & weekends Office Phone:  [phone] Office Fax:  [phone]

## 2019-01-24 ENCOUNTER — Inpatient Hospital Stay (HOSPITAL_COMMUNITY)
Admission: EM | Admit: 2019-01-24 | Discharge: 2019-02-08 | DRG: 871 | Disposition: A | Discharge status: Home Health | Payer: Medicare Other | Source: Skilled Nursing Facility | Attending: Family Medicine | Admitting: Family Medicine

## 2019-01-24 ENCOUNTER — Emergency Department (HOSPITAL_COMMUNITY): Payer: Medicare Other

## 2019-01-24 ENCOUNTER — Encounter (HOSPITAL_COMMUNITY): Payer: Self-pay

## 2019-01-24 ENCOUNTER — Telehealth: Payer: Self-pay | Admitting: Internal Medicine

## 2019-01-24 ENCOUNTER — Other Ambulatory Visit: Payer: Self-pay

## 2019-01-24 DIAGNOSIS — R402222 Coma scale, best verbal response, incomprehensible words, at arrival to emergency department: Secondary | ICD-10-CM | POA: Diagnosis present

## 2019-01-24 DIAGNOSIS — R7989 Other specified abnormal findings of blood chemistry: Secondary | ICD-10-CM | POA: Insufficient documentation

## 2019-01-24 DIAGNOSIS — Z8249 Family history of ischemic heart disease and other diseases of the circulatory system: Secondary | ICD-10-CM

## 2019-01-24 DIAGNOSIS — R9401 Abnormal electroencephalogram [EEG]: Secondary | ICD-10-CM | POA: Diagnosis not present

## 2019-01-24 DIAGNOSIS — Z7982 Long term (current) use of aspirin: Secondary | ICD-10-CM

## 2019-01-24 DIAGNOSIS — Z978 Presence of other specified devices: Secondary | ICD-10-CM | POA: Diagnosis not present

## 2019-01-24 DIAGNOSIS — Z8619 Personal history of other infectious and parasitic diseases: Secondary | ICD-10-CM

## 2019-01-24 DIAGNOSIS — K8309 Other cholangitis: Secondary | ICD-10-CM

## 2019-01-24 DIAGNOSIS — Y738 Miscellaneous gastroenterology and urology devices associated with adverse incidents, not elsewhere classified: Secondary | ICD-10-CM | POA: Diagnosis present

## 2019-01-24 DIAGNOSIS — Z01810 Encounter for preprocedural cardiovascular examination: Secondary | ICD-10-CM

## 2019-01-24 DIAGNOSIS — R402342 Coma scale, best motor response, flexion withdrawal, at arrival to emergency department: Secondary | ICD-10-CM | POA: Diagnosis present

## 2019-01-24 DIAGNOSIS — K862 Cyst of pancreas: Secondary | ICD-10-CM | POA: Diagnosis present

## 2019-01-24 DIAGNOSIS — A419 Sepsis, unspecified organism: Secondary | ICD-10-CM

## 2019-01-24 DIAGNOSIS — E1165 Type 2 diabetes mellitus with hyperglycemia: Secondary | ICD-10-CM | POA: Diagnosis present

## 2019-01-24 DIAGNOSIS — R5381 Other malaise: Secondary | ICD-10-CM | POA: Diagnosis not present

## 2019-01-24 DIAGNOSIS — Z86718 Personal history of other venous thrombosis and embolism: Secondary | ICD-10-CM

## 2019-01-24 DIAGNOSIS — I472 Ventricular tachycardia, unspecified: Secondary | ICD-10-CM

## 2019-01-24 DIAGNOSIS — Z7401 Bed confinement status: Secondary | ICD-10-CM | POA: Diagnosis not present

## 2019-01-24 DIAGNOSIS — R0682 Tachypnea, not elsewhere classified: Secondary | ICD-10-CM | POA: Diagnosis not present

## 2019-01-24 DIAGNOSIS — M25512 Pain in left shoulder: Secondary | ICD-10-CM | POA: Diagnosis not present

## 2019-01-24 DIAGNOSIS — I428 Other cardiomyopathies: Secondary | ICD-10-CM | POA: Diagnosis present

## 2019-01-24 DIAGNOSIS — R739 Hyperglycemia, unspecified: Secondary | ICD-10-CM

## 2019-01-24 DIAGNOSIS — I6782 Cerebral ischemia: Secondary | ICD-10-CM | POA: Diagnosis not present

## 2019-01-24 DIAGNOSIS — R000 Tachycardia, unspecified: Secondary | ICD-10-CM | POA: Diagnosis not present

## 2019-01-24 DIAGNOSIS — Z8546 Personal history of malignant neoplasm of prostate: Secondary | ICD-10-CM

## 2019-01-24 DIAGNOSIS — T8131XA Disruption of external operation (surgical) wound, not elsewhere classified, initial encounter: Secondary | ICD-10-CM | POA: Diagnosis not present

## 2019-01-24 DIAGNOSIS — I5022 Chronic systolic (congestive) heart failure: Secondary | ICD-10-CM | POA: Diagnosis present

## 2019-01-24 DIAGNOSIS — F0390 Unspecified dementia without behavioral disturbance: Secondary | ICD-10-CM | POA: Diagnosis present

## 2019-01-24 DIAGNOSIS — E876 Hypokalemia: Secondary | ICD-10-CM | POA: Diagnosis not present

## 2019-01-24 DIAGNOSIS — T85598A Other mechanical complication of other gastrointestinal prosthetic devices, implants and grafts, initial encounter: Secondary | ICD-10-CM | POA: Diagnosis not present

## 2019-01-24 DIAGNOSIS — I5043 Acute on chronic combined systolic (congestive) and diastolic (congestive) heart failure: Secondary | ICD-10-CM | POA: Diagnosis not present

## 2019-01-24 DIAGNOSIS — D72829 Elevated white blood cell count, unspecified: Secondary | ICD-10-CM | POA: Diagnosis not present

## 2019-01-24 DIAGNOSIS — Z8709 Personal history of other diseases of the respiratory system: Secondary | ICD-10-CM | POA: Diagnosis not present

## 2019-01-24 DIAGNOSIS — Z9049 Acquired absence of other specified parts of digestive tract: Secondary | ICD-10-CM | POA: Diagnosis not present

## 2019-01-24 DIAGNOSIS — R627 Adult failure to thrive: Secondary | ICD-10-CM | POA: Diagnosis present

## 2019-01-24 DIAGNOSIS — D649 Anemia, unspecified: Secondary | ICD-10-CM | POA: Diagnosis present

## 2019-01-24 DIAGNOSIS — R945 Abnormal results of liver function studies: Secondary | ICD-10-CM | POA: Diagnosis not present

## 2019-01-24 DIAGNOSIS — T85518A Breakdown (mechanical) of other gastrointestinal prosthetic devices, implants and grafts, initial encounter: Secondary | ICD-10-CM | POA: Diagnosis present

## 2019-01-24 DIAGNOSIS — A4159 Other Gram-negative sepsis: Principal | ICD-10-CM | POA: Diagnosis present

## 2019-01-24 DIAGNOSIS — T85520D Displacement of bile duct prosthesis, subsequent encounter: Secondary | ICD-10-CM

## 2019-01-24 DIAGNOSIS — R6520 Severe sepsis without septic shock: Secondary | ICD-10-CM | POA: Diagnosis present

## 2019-01-24 DIAGNOSIS — K8050 Calculus of bile duct without cholangitis or cholecystitis without obstruction: Secondary | ICD-10-CM | POA: Diagnosis not present

## 2019-01-24 DIAGNOSIS — R740 Nonspecific elevation of levels of transaminase and lactic acid dehydrogenase [LDH]: Secondary | ICD-10-CM

## 2019-01-24 DIAGNOSIS — R7401 Elevation of levels of liver transaminase levels: Secondary | ICD-10-CM

## 2019-01-24 DIAGNOSIS — Z515 Encounter for palliative care: Secondary | ICD-10-CM | POA: Diagnosis present

## 2019-01-24 DIAGNOSIS — Z794 Long term (current) use of insulin: Secondary | ICD-10-CM

## 2019-01-24 DIAGNOSIS — M2540 Effusion, unspecified joint: Secondary | ICD-10-CM

## 2019-01-24 DIAGNOSIS — G7281 Critical illness myopathy: Secondary | ICD-10-CM | POA: Diagnosis not present

## 2019-01-24 DIAGNOSIS — G4733 Obstructive sleep apnea (adult) (pediatric): Secondary | ICD-10-CM | POA: Diagnosis present

## 2019-01-24 DIAGNOSIS — I2510 Atherosclerotic heart disease of native coronary artery without angina pectoris: Secondary | ICD-10-CM | POA: Diagnosis present

## 2019-01-24 DIAGNOSIS — Z952 Presence of prosthetic heart valve: Secondary | ICD-10-CM

## 2019-01-24 DIAGNOSIS — I350 Nonrheumatic aortic (valve) stenosis: Secondary | ICD-10-CM | POA: Diagnosis not present

## 2019-01-24 DIAGNOSIS — Z5320 Procedure and treatment not carried out because of patient's decision for unspecified reasons: Secondary | ICD-10-CM | POA: Diagnosis not present

## 2019-01-24 DIAGNOSIS — R54 Age-related physical debility: Secondary | ICD-10-CM | POA: Diagnosis present

## 2019-01-24 DIAGNOSIS — K8020 Calculus of gallbladder without cholecystitis without obstruction: Secondary | ICD-10-CM | POA: Diagnosis not present

## 2019-01-24 DIAGNOSIS — Z66 Do not resuscitate: Secondary | ICD-10-CM

## 2019-01-24 DIAGNOSIS — M25532 Pain in left wrist: Secondary | ICD-10-CM | POA: Diagnosis not present

## 2019-01-24 DIAGNOSIS — A4181 Sepsis due to Enterococcus: Secondary | ICD-10-CM | POA: Diagnosis present

## 2019-01-24 DIAGNOSIS — A021 Salmonella sepsis: Secondary | ICD-10-CM | POA: Diagnosis not present

## 2019-01-24 DIAGNOSIS — R404 Transient alteration of awareness: Secondary | ICD-10-CM | POA: Diagnosis not present

## 2019-01-24 DIAGNOSIS — Z8679 Personal history of other diseases of the circulatory system: Secondary | ICD-10-CM | POA: Diagnosis not present

## 2019-01-24 DIAGNOSIS — J45909 Unspecified asthma, uncomplicated: Secondary | ICD-10-CM | POA: Diagnosis present

## 2019-01-24 DIAGNOSIS — Z79899 Other long term (current) drug therapy: Secondary | ICD-10-CM

## 2019-01-24 DIAGNOSIS — R410 Disorientation, unspecified: Secondary | ICD-10-CM | POA: Diagnosis not present

## 2019-01-24 DIAGNOSIS — R011 Cardiac murmur, unspecified: Secondary | ICD-10-CM | POA: Diagnosis not present

## 2019-01-24 DIAGNOSIS — M2550 Pain in unspecified joint: Secondary | ICD-10-CM | POA: Diagnosis not present

## 2019-01-24 DIAGNOSIS — G9341 Metabolic encephalopathy: Secondary | ICD-10-CM | POA: Diagnosis present

## 2019-01-24 DIAGNOSIS — R402132 Coma scale, eyes open, to sound, at arrival to emergency department: Secondary | ICD-10-CM | POA: Diagnosis present

## 2019-01-24 DIAGNOSIS — R509 Fever, unspecified: Secondary | ICD-10-CM | POA: Diagnosis not present

## 2019-01-24 DIAGNOSIS — G92 Toxic encephalopathy: Secondary | ICD-10-CM | POA: Diagnosis not present

## 2019-01-24 DIAGNOSIS — R29898 Other symptoms and signs involving the musculoskeletal system: Secondary | ICD-10-CM | POA: Diagnosis not present

## 2019-01-24 DIAGNOSIS — E785 Hyperlipidemia, unspecified: Secondary | ICD-10-CM | POA: Diagnosis present

## 2019-01-24 DIAGNOSIS — I447 Left bundle-branch block, unspecified: Secondary | ICD-10-CM | POA: Diagnosis present

## 2019-01-24 DIAGNOSIS — Z95810 Presence of automatic (implantable) cardiac defibrillator: Secondary | ICD-10-CM | POA: Diagnosis not present

## 2019-01-24 DIAGNOSIS — Y831 Surgical operation with implant of artificial internal device as the cause of abnormal reaction of the patient, or of later complication, without mention of misadventure at the time of the procedure: Secondary | ICD-10-CM | POA: Diagnosis present

## 2019-01-24 DIAGNOSIS — R6521 Severe sepsis with septic shock: Secondary | ICD-10-CM | POA: Diagnosis not present

## 2019-01-24 DIAGNOSIS — Z7902 Long term (current) use of antithrombotics/antiplatelets: Secondary | ICD-10-CM

## 2019-01-24 DIAGNOSIS — K8032 Calculus of bile duct with acute cholangitis without obstruction: Secondary | ICD-10-CM | POA: Diagnosis present

## 2019-01-24 DIAGNOSIS — D696 Thrombocytopenia, unspecified: Secondary | ICD-10-CM | POA: Diagnosis not present

## 2019-01-24 DIAGNOSIS — T85520A Displacement of bile duct prosthesis, initial encounter: Secondary | ICD-10-CM

## 2019-01-24 DIAGNOSIS — K8063 Calculus of gallbladder and bile duct with acute cholecystitis with obstruction: Secondary | ICD-10-CM | POA: Diagnosis present

## 2019-01-24 DIAGNOSIS — M79632 Pain in left forearm: Secondary | ICD-10-CM | POA: Diagnosis not present

## 2019-01-24 DIAGNOSIS — F05 Delirium due to known physiological condition: Secondary | ICD-10-CM | POA: Diagnosis present

## 2019-01-24 DIAGNOSIS — J9602 Acute respiratory failure with hypercapnia: Secondary | ICD-10-CM | POA: Diagnosis not present

## 2019-01-24 DIAGNOSIS — R0902 Hypoxemia: Secondary | ICD-10-CM | POA: Diagnosis not present

## 2019-01-24 DIAGNOSIS — M47816 Spondylosis without myelopathy or radiculopathy, lumbar region: Secondary | ICD-10-CM | POA: Diagnosis not present

## 2019-01-24 DIAGNOSIS — R17 Unspecified jaundice: Secondary | ICD-10-CM

## 2019-01-24 DIAGNOSIS — K8080 Other cholelithiasis without obstruction: Secondary | ICD-10-CM | POA: Diagnosis not present

## 2019-01-24 DIAGNOSIS — T85698A Other mechanical complication of other specified internal prosthetic devices, implants and grafts, initial encounter: Secondary | ICD-10-CM | POA: Diagnosis not present

## 2019-01-24 DIAGNOSIS — I714 Abdominal aortic aneurysm, without rupture: Secondary | ICD-10-CM | POA: Diagnosis not present

## 2019-01-24 DIAGNOSIS — I5042 Chronic combined systolic (congestive) and diastolic (congestive) heart failure: Secondary | ICD-10-CM | POA: Diagnosis not present

## 2019-01-24 DIAGNOSIS — F0150 Vascular dementia without behavioral disturbance: Secondary | ICD-10-CM | POA: Diagnosis not present

## 2019-01-24 DIAGNOSIS — M79603 Pain in arm, unspecified: Secondary | ICD-10-CM

## 2019-01-24 DIAGNOSIS — T85520S Displacement of bile duct prosthesis, sequela: Secondary | ICD-10-CM

## 2019-01-24 DIAGNOSIS — A4189 Other specified sepsis: Secondary | ICD-10-CM | POA: Diagnosis not present

## 2019-01-24 DIAGNOSIS — Z888 Allergy status to other drugs, medicaments and biological substances status: Secondary | ICD-10-CM

## 2019-01-24 DIAGNOSIS — M19032 Primary osteoarthritis, left wrist: Secondary | ICD-10-CM | POA: Diagnosis present

## 2019-01-24 DIAGNOSIS — R52 Pain, unspecified: Secondary | ICD-10-CM

## 2019-01-24 DIAGNOSIS — C61 Malignant neoplasm of prostate: Secondary | ICD-10-CM | POA: Diagnosis not present

## 2019-01-24 DIAGNOSIS — K819 Cholecystitis, unspecified: Secondary | ICD-10-CM | POA: Diagnosis not present

## 2019-01-24 DIAGNOSIS — Z7189 Other specified counseling: Secondary | ICD-10-CM | POA: Diagnosis not present

## 2019-01-24 LAB — URINALYSIS, ROUTINE W REFLEX MICROSCOPIC
Bacteria, UA: NONE SEEN
Glucose, UA: 50 mg/dL — AB
Ketones, ur: NEGATIVE mg/dL
Nitrite: NEGATIVE
Protein, ur: NEGATIVE mg/dL
Specific Gravity, Urine: 1.018 (ref 1.005–1.030)
WBC, UA: >50 WBC/hpf — ABNORMAL HIGH (ref 0–5)
pH: 5 (ref 5.0–8.0)

## 2019-01-24 LAB — COMPREHENSIVE METABOLIC PANEL
ALT: 267 U/L — ABNORMAL HIGH (ref 0–44)
AST: 303 U/L — ABNORMAL HIGH (ref 15–41)
Albumin: 2.7 g/dL — ABNORMAL LOW (ref 3.5–5.0)
Alkaline Phosphatase: 595 U/L — ABNORMAL HIGH (ref 38–126)
Anion gap: 12 (ref 5–15)
BUN: 16 mg/dL (ref 8–23)
CO2: 22 mmol/L (ref 22–32)
Calcium: 8.8 mg/dL — ABNORMAL LOW (ref 8.9–10.3)
Chloride: 99 mmol/L (ref 98–111)
Creatinine, Ser: 1.11 mg/dL (ref 0.61–1.24)
GFR calc Af Amer: >60 mL/min (ref >60)
GFR calc non Af Amer: 59 mL/min — ABNORMAL LOW (ref >60)
Glucose, Bld: 255 mg/dL — ABNORMAL HIGH (ref 70–99)
Potassium: 3.8 mmol/L (ref 3.5–5.1)
Sodium: 133 mmol/L — ABNORMAL LOW (ref 135–145)
Total Bilirubin: 4 mg/dL — ABNORMAL HIGH (ref 0.3–1.2)
Total Protein: 6.8 g/dL (ref 6.5–8.1)

## 2019-01-24 LAB — APTT: aPTT: 31 seconds (ref 24–36)

## 2019-01-24 LAB — CBC WITH DIFFERENTIAL/PLATELET
Abs Immature Granulocytes: 0.05 K/uL (ref 0.00–0.07)
Basophils Absolute: 0 K/uL (ref 0.0–0.1)
Basophils Relative: 0 %
Eosinophils Absolute: 0 K/uL (ref 0.0–0.5)
Eosinophils Relative: 0 %
HCT: 36.6 % — ABNORMAL LOW (ref 39.0–52.0)
Hemoglobin: 12.8 g/dL — ABNORMAL LOW (ref 13.0–17.0)
Immature Granulocytes: 1 %
Lymphocytes Relative: 3 %
Lymphs Abs: 0.3 K/uL — ABNORMAL LOW (ref 0.7–4.0)
MCH: 31.4 pg (ref 26.0–34.0)
MCHC: 35 g/dL (ref 30.0–36.0)
MCV: 89.9 fL (ref 80.0–100.0)
Monocytes Absolute: 0.7 K/uL (ref 0.1–1.0)
Monocytes Relative: 6 %
Neutro Abs: 10 K/uL — ABNORMAL HIGH (ref 1.7–7.7)
Neutrophils Relative %: 90 %
Platelets: 146 K/uL — ABNORMAL LOW (ref 150–400)
RBC: 4.07 MIL/uL — ABNORMAL LOW (ref 4.22–5.81)
RDW: 16 % — ABNORMAL HIGH (ref 11.5–15.5)
WBC: 11.1 K/uL — ABNORMAL HIGH (ref 4.0–10.5)
nRBC: 0 % (ref 0.0–0.2)

## 2019-01-24 LAB — LACTIC ACID, PLASMA
Lactic Acid, Venous: 2.7 mmol/L (ref 0.5–1.9)
Lactic Acid, Venous: 2.6 mmol/L (ref 0.5–1.9)
Lactic Acid, Venous: 3.1 mmol/L (ref 0.5–1.9)

## 2019-01-24 LAB — TROPONIN I: Troponin I: 0.04 ng/mL (ref <0.03)

## 2019-01-24 LAB — PROTIME-INR
INR: 1.2 (ref 0.8–1.2)
Prothrombin Time: 14.9 seconds (ref 11.4–15.2)

## 2019-01-24 LAB — LIPASE, BLOOD: Lipase: 51 U/L (ref 11–51)

## 2019-01-24 MED ORDER — PIPERACILLIN-TAZOBACTAM 3.375 G IVPB 30 MIN
3.3750 g | Freq: Once | INTRAVENOUS | Status: AC
  Administered 2019-01-24: 3.375 g via INTRAVENOUS
  Filled 2019-01-24: qty 50

## 2019-01-24 MED ORDER — SODIUM CHLORIDE 0.9 % IV SOLN
2.0000 g | Freq: Two times a day (BID) | INTRAVENOUS | Status: DC
  Administered 2019-01-25 – 2019-01-28 (×7): 2 g via INTRAVENOUS
  Filled 2019-01-24 (×9): qty 2

## 2019-01-24 MED ORDER — SODIUM CHLORIDE 0.9% FLUSH
3.0000 mL | Freq: Two times a day (BID) | INTRAVENOUS | Status: DC
  Administered 2019-01-25 – 2019-02-01 (×4): 3 mL via INTRAVENOUS

## 2019-01-24 MED ORDER — METRONIDAZOLE IN NACL 5-0.79 MG/ML-% IV SOLN
500.0000 mg | Freq: Three times a day (TID) | INTRAVENOUS | Status: DC
  Administered 2019-01-25 – 2019-02-01 (×22): 500 mg via INTRAVENOUS
  Filled 2019-01-24 (×21): qty 100

## 2019-01-24 MED ORDER — SODIUM CHLORIDE 0.9 % IV SOLN: 2.0000 g | Freq: Once | INTRAVENOUS | Status: DC

## 2019-01-24 MED ORDER — ACETAMINOPHEN 650 MG RE SUPP
650.0000 mg | Freq: Once | RECTAL | Status: AC
  Administered 2019-01-24: 650 mg via RECTAL
  Filled 2019-01-24: qty 1

## 2019-01-24 MED ORDER — ONDANSETRON HCL 4 MG/2ML IJ SOLN
4.0000 mg | Freq: Four times a day (QID) | INTRAMUSCULAR | Status: DC | PRN
  Administered 2019-01-28 – 2019-01-29 (×2): 4 mg via INTRAVENOUS
  Filled 2019-01-24: qty 2

## 2019-01-24 MED ORDER — SODIUM CHLORIDE 0.9% FLUSH
3.0000 mL | Freq: Two times a day (BID) | INTRAVENOUS | Status: DC
  Administered 2019-01-25 – 2019-02-08 (×9): 3 mL via INTRAVENOUS

## 2019-01-24 MED ORDER — SODIUM CHLORIDE 0.9 % IV BOLUS
250.0000 mL | Freq: Once | INTRAVENOUS | Status: AC
  Administered 2019-01-24: 250 mL via INTRAVENOUS

## 2019-01-24 MED ORDER — VANCOMYCIN HCL 10 G IV SOLR
1500.0000 mg | INTRAVENOUS | Status: DC
  Administered 2019-01-25: 1500 mg via INTRAVENOUS
  Filled 2019-01-24 (×2): qty 1500

## 2019-01-24 MED ORDER — SODIUM CHLORIDE 0.9 % IV BOLUS
250.0000 mL | Freq: Once | INTRAVENOUS | Status: AC
  Administered 2019-01-25: via INTRAVENOUS

## 2019-01-24 MED ORDER — ACETAMINOPHEN 325 MG PO TABS
650.0000 mg | ORAL_TABLET | Freq: Four times a day (QID) | ORAL | Status: DC | PRN
  Administered 2019-01-28: 650 mg via ORAL
  Filled 2019-01-24 (×3): qty 2

## 2019-01-24 MED ORDER — SODIUM CHLORIDE 0.9 % IV SOLN
2.0000 g | Freq: Once | INTRAVENOUS | Status: DC
  Administered 2019-01-24: 2 g via INTRAVENOUS
  Filled 2019-01-24: qty 2

## 2019-01-24 MED ORDER — ACETAMINOPHEN 650 MG RE SUPP: 650.0000 mg | Freq: Four times a day (QID) | RECTAL | Status: DC | PRN

## 2019-01-24 MED ORDER — SODIUM CHLORIDE 0.9 % IV SOLN
250.0000 mL | INTRAVENOUS | Status: DC | PRN
  Administered 2019-01-25 – 2019-01-29 (×2): via INTRAVENOUS
  Administered 2019-01-30: 250 mL via INTRAVENOUS

## 2019-01-24 MED ORDER — ONDANSETRON HCL 4 MG PO TABS: 4.0000 mg | ORAL_TABLET | Freq: Four times a day (QID) | ORAL | Status: DC | PRN

## 2019-01-24 MED ORDER — VANCOMYCIN HCL IN DEXTROSE 1-5 GM/200ML-% IV SOLN: 1000.0000 mg | Freq: Once | INTRAVENOUS | Status: DC

## 2019-01-24 MED ORDER — PANTOPRAZOLE SODIUM 40 MG IV SOLR
40.0000 mg | INTRAVENOUS | Status: DC
  Administered 2019-01-25 – 2019-02-01 (×9): 40 mg via INTRAVENOUS
  Filled 2019-01-24 (×9): qty 40

## 2019-01-24 MED ORDER — AMIODARONE HCL 200 MG PO TABS
100.0000 mg | ORAL_TABLET | Freq: Every day | ORAL | Status: DC
  Administered 2019-01-25 – 2019-02-08 (×14): 100 mg via ORAL
  Filled 2019-01-24 (×15): qty 1

## 2019-01-24 MED ORDER — INSULIN ASPART 100 UNIT/ML ~~LOC~~ SOLN
0.0000 [IU] | SUBCUTANEOUS | Status: DC
  Administered 2019-01-25: 2 [IU] via SUBCUTANEOUS
  Administered 2019-01-25 – 2019-01-27 (×3): 1 [IU] via SUBCUTANEOUS
  Administered 2019-01-27: 13:00:00 2 [IU] via SUBCUTANEOUS
  Administered 2019-01-28 (×2): 1 [IU] via SUBCUTANEOUS
  Administered 2019-01-28: 2 [IU] via SUBCUTANEOUS
  Administered 2019-01-29 (×2): 1 [IU] via SUBCUTANEOUS
  Administered 2019-01-29: 2 [IU] via SUBCUTANEOUS
  Administered 2019-01-29: 1 [IU] via SUBCUTANEOUS
  Administered 2019-01-30 (×4): 2 [IU] via SUBCUTANEOUS
  Administered 2019-01-31: 1 [IU] via SUBCUTANEOUS
  Administered 2019-01-31 (×2): 2 [IU] via SUBCUTANEOUS
  Administered 2019-01-31: 1 [IU] via SUBCUTANEOUS
  Administered 2019-02-01 (×2): 2 [IU] via SUBCUTANEOUS
  Administered 2019-02-01: 1 [IU] via SUBCUTANEOUS
  Administered 2019-02-01 (×2): 2 [IU] via SUBCUTANEOUS
  Administered 2019-02-02 (×3): 1 [IU] via SUBCUTANEOUS
  Administered 2019-02-02 (×2): 2 [IU] via SUBCUTANEOUS
  Administered 2019-02-03: 13:00:00 1 [IU] via SUBCUTANEOUS
  Administered 2019-02-03 (×3): 2 [IU] via SUBCUTANEOUS
  Administered 2019-02-03: 1 [IU] via SUBCUTANEOUS
  Administered 2019-02-03 – 2019-02-04 (×2): 2 [IU] via SUBCUTANEOUS
  Administered 2019-02-04: 1 [IU] via SUBCUTANEOUS
  Administered 2019-02-04 – 2019-02-05 (×3): 2 [IU] via SUBCUTANEOUS
  Administered 2019-02-05 (×2): 1 [IU] via SUBCUTANEOUS
  Administered 2019-02-05: 2 [IU] via SUBCUTANEOUS
  Administered 2019-02-06 (×2): 1 [IU] via SUBCUTANEOUS
  Administered 2019-02-06: 2 [IU] via SUBCUTANEOUS
  Administered 2019-02-06 – 2019-02-07 (×3): 1 [IU] via SUBCUTANEOUS
  Administered 2019-02-07: 2 [IU] via SUBCUTANEOUS
  Administered 2019-02-07 – 2019-02-08 (×2): 1 [IU] via SUBCUTANEOUS
  Administered 2019-02-08: 2 [IU] via SUBCUTANEOUS

## 2019-01-24 MED ORDER — SODIUM CHLORIDE 0.9% FLUSH
3.0000 mL | INTRAVENOUS | Status: DC | PRN
  Administered 2019-01-25: 3 mL via INTRAVENOUS

## 2019-01-24 MED ORDER — METRONIDAZOLE IN NACL 5-0.79 MG/ML-% IV SOLN
500.0000 mg | Freq: Once | INTRAVENOUS | Status: DC
  Filled 2019-01-24: qty 100

## 2019-01-24 MED ORDER — VANCOMYCIN HCL 10 G IV SOLR
2000.0000 mg | Freq: Once | INTRAVENOUS | Status: AC
  Administered 2019-01-24: 2000 mg via INTRAVENOUS
  Filled 2019-01-24: qty 2000

## 2019-01-24 NOTE — Progress Notes (Addendum)
Pharmacy Antibiotic Note  KAYLEM GIDNEY is a 85 y.o. male admitted on 01/24/2019 with sepsis. Pharmacy has been consulted for vancomycin and cefepime dosing. Pt with low grade fever, tmax 100.8. WBC is slightly elevated and SCr is WNL. Lactic acid is elevated at 2.7.   Plan: Vancomycin 2gm IV x 1 then 1500mg  IV Q24H Cefepime 2gm IV Q12H F/u renal fxn, C&S, clinical status and peak/trough at SS  Height: 5\' 10"  (177.8 cm) Weight: 202 lb 13.2 oz (92 kg) IBW/kg (Calculated) : 73  Temp (24hrs), Avg:100.8 F (38.2 C), Min:100.8 F (38.2 C), Max:100.8 F (38.2 C)  Recent Labs  Lab 01/24/19 1935  WBC 11.1*  CREATININE 1.11  LATICACIDVEN 2.7*    Estimated Creatinine Clearance: 51.4 mL/min (by C-G formula based on SCr of 1.11 mg/dL).    Allergies  Allergen Reactions  . Crestor [Rosuvastatin] Other (See Comments)    Hurts muscles  . Lipitor [Atorvastatin] Other (See Comments)    Hurts stomach  . Shrimp [Shellfish Allergy] Other (See Comments)    On MAR    Antimicrobials this admission: Vanc 4/12>> Cefepime 4/12>> Flagyl 4/12>> Zosyn x 1 4/12  Dose adjustments this admission: N/A  Microbiology results: Pending  Thank you for allowing pharmacy to be a part of this patient's care.  Mykah Shin, Rande Lawman 01/24/2019 7:31 PM

## 2019-01-24 NOTE — H&P (Signed)
History and Physical    William [name] YIR:[id number] DOB: 1927-10-30 DOA: 01/24/2019  PCP: Gayland Curry, DO   Patient coming from: SNF   Chief Complaint: Lethargic, febrile, nausea with vomiting brownish material  HPI: William [name] is a 85 y.o. male with medical history significant for dementia, AAA, aortic stenosis status post TAVR, pulmonary nodules, chronic systolic CHF, history of VT with AICD that was removed due to infection, cholecystitis and cholangitis with biliary drain in place, debility at SNF, now presenting to the emergency department for evaluation of lethargy, fevers, and vomiting.  Patient is unable to contribute much to the history due to his clinical condition.  Per report of the patient's daughter and nursing facility personnel, there has been decreasing output from the patient's biliary drain and he has developed nausea over the past couple days with very poor intake, progressing to lethargy today.  He was reportedly pale, slumped over, and vomiting some brownish material.  He was noted to have a fever to 100.7 F, tachycardic in the 130s, and blood pressure stable initially.  He has not been coughing and has not appeared dyspneic or requiring any oxygen per report.  He was sent into the ED for evaluation of his change in condition.  ED Course: Upon arrival to the ED, patient is found to be febrile to 38.2 C, saturating well on room air, tachycardic in the 120s, initial blood pressure 83 systolic, and normal respiratory rate.  EKG features a sinus tachycardia with chronic LBBB.  Chest x-ray is notable for patchy left basilar atelectasis versus early infiltrate.  Chemistry panel notable for sodium 133, glucose 255, alkaline phosphatase 595, AST and ALT in the 300 range, and total bilirubin of 4.  CBC features a leukocytosis to 11,100 and a slight thrombocytopenia.  Lactic acid is elevated to 2.7.  Blood cultures were collected in the ED, 250 cc normal saline bolus was  given, and the patient was treated with vancomycin and Zosyn.  General surgery was consulted by the ED clinician, recommended consultation with GI, ED PA reports that she is awaiting for callback from GI at this time and requests a medical admission.  Review of Systems:  Unable to complete ROS secondary to the patient's clinical condition.  Past Medical History:  Diagnosis Date  . AAA (abdominal aortic aneurysm) ([place])    7/13 3.8cm  . Arthritis    "joints" (01/11/2014)  . Asthma    "seasonal; some foods"   . Chronic systolic CHF (congestive heart failure) ([place])   . Dementia ([place])   . HLD (hyperlipidemia)   . HOH (hard of hearing)   . Lumbar vertebral fracture ([facility])    L1- 04/11/2014   . Prostate cancer ([place])   . S/P ICD (internal cardiac defibrillator) procedure, 01/11/14 removal of ERI gen and placement of Medtronic Evera XT VR & NEW Right ventricular lead Medtronic 01/12/2014  . S/P TAVR (transcatheter aortic valve replacement)    Edwards Sapien 3 THV (size 29 mm, model # B6411258, serial # [id number])  . Severe aortic stenosis   . Sleep apnea    "lost 60# & don't have it anymore" (01/11/2014)  . Ventricular tachycardia [facility])     Past Surgical History:  Procedure Laterality Date  . CARDIAC CATHETERIZATION  08/20/2004   noncritical CAD,mild global hypokinesis, EF 50%  . CARDIAC DEFIBRILLATOR PLACEMENT  08/23/2004   Medtronic  . CATARACT EXTRACTION W/ INTRAOCULAR LENS IMPLANT Right   . COLECTOMY  1990's  . CRYOABLATION N/A  02/24/2014   Procedure: CRYO ABLATION PROSTATE;  Surgeon: Ailene Rud, MD;  Location: [facility] ORS;  Service: Urology;  Laterality: N/A;  . HERNIA REPAIR     "abdomen; from colon OR"  . ICD LEAD REMOVAL N/A 09/15/2018   Procedure: ICD LEAD REMOVAL EXTRACTION;  Surgeon: Evans Lance, MD;  Location: [facility] OR;  Service: Cardiovascular;  Laterality: N/A;  DR. BARTLE TO BACK UP  . IMPLANTABLE CARDIOVERTER DEFIBRILLATOR (ICD) GENERATOR CHANGE N/A 01/11/2014    Procedure: ICD GENERATOR CHANGE;  Surgeon: Sanda Klein, MD;  Location: [place] CATH LAB;  Service: Cardiovascular;  Laterality: N/A;  . IR EXCHANGE BILIARY DRAIN  12/18/2018  . IR EXCHANGE BILIARY DRAIN  01/05/2019  . IR PERC CHOLECYSTOSTOMY  11/22/2018  . LEAD REVISION N/A 01/11/2014   Procedure: LEAD REVISION;  Surgeon: Sanda Klein, MD;  Location: [place] CATH LAB;  Service: Cardiovascular;  Laterality: N/A;  . NM MYOCAR PERF WALL MOTION  01/29/2012   abnormal c/o infarct/scar,no ischemia present  . PROSTATE BIOPSY N/A 11/22/2013   Procedure: PROSTATE BIOPSY AND ULTRASOUND;  Surgeon: Ailene Rud, MD;  Location: [facility];  Service: Urology;  Laterality: N/A;  . RIGHT/LEFT HEART CATH AND CORONARY ANGIOGRAPHY N/A 07/06/2018   Procedure: RIGHT/LEFT HEART CATH AND CORONARY ANGIOGRAPHY;  Surgeon: Troy Sine, MD;  Location: [place] CV LAB;  Service: Cardiovascular;  Laterality: N/A;  . TEE WITHOUT CARDIOVERSION N/A 08/18/2018   Procedure: TRANSESOPHAGEAL ECHOCARDIOGRAM (TEE);  Surgeon: Burnell Blanks, MD;  Location: [place];  Service: Open Heart Surgery;  Laterality: N/A;  . TEE WITHOUT CARDIOVERSION N/A 09/08/2018   Procedure: TRANSESOPHAGEAL ECHOCARDIOGRAM (TEE);  Surgeon: Lelon Perla, MD;  Location: [facility] ENDOSCOPY;  Service: Cardiovascular;  Laterality: N/A;  . TEE WITHOUT CARDIOVERSION N/A 09/15/2018   Procedure: TRANSESOPHAGEAL ECHOCARDIOGRAM (TEE);  Surgeon: Evans Lance, MD;  Location: [facility] OR;  Service: Cardiovascular;  Laterality: N/A;  . TEE WITHOUT CARDIOVERSION N/A 11/26/2018   Procedure: TRANSESOPHAGEAL ECHOCARDIOGRAM (TEE);  Surgeon: Jerline Pain, MD;  Location: [facility] END[facility]COPY;  Service: Cardiovascular;  Laterality: N/A;  . TRANSCATHETER AORTIC VALVE REPLACEMENT, TRANSFEMORAL  08/18/2018  . TRANSCATHETER AORTIC VALVE REPLACEMENT, TRANSFEMORAL N/A 08/18/2018   Procedure: TRANSCATHETER AORTIC VALVE REPLACEMENT, TRANSFEMORAL using an Edwards 68mm Aortic Valve;  Surgeon:  Burnell Blanks, MD;  Location: [place];  Service: Open Heart Surgery;  Laterality: N/A;     reports that he has never smoked. He has never used smokeless tobacco. He reports current alcohol use. He reports that he does not use drugs.  Allergies  Allergen Reactions  . Crestor [Rosuvastatin] Other (See Comments)    Hurts muscles  . Lipitor [Atorvastatin] Other (See Comments)    Hurts stomach  . Shrimp [Shellfish Allergy] Other (See Comments)    On MAR    Family History  Problem Relation Age of Onset  . Heart failure Mother        Died of "old age" at 66  . Pneumonia Father      Prior to Admission medications   Medication Sig Start Date End Date Taking? Authorizing Provider  acetaminophen (TYLENOL) 500 MG tablet Take 500 mg by mouth every 6 (six) hours as needed (for pain).     [provider]  amiodarone (PACERONE) 200 MG tablet Take 0.5 tablets (100 mg total) by mouth daily. 11/16/18   Croitoru, Mihai, MD  aspirin 81 MG chewable tablet Chew 1 tablet (81 mg total) by mouth daily. 07/08/18   Kroeger, Lorelee Cover., PA-C  bisacodyl (  DULCOLAX) 10 MG suppository Place 1 suppository (10 mg total) rectally daily as needed for moderate constipation. 11/27/18   Ghimire, Henreitta Leber, MD  cholecalciferol (VITAMIN D) 1000 units tablet Take 2,000 Units by mouth at bedtime.    [provider]  clopidogrel (PLAVIX) 75 MG tablet Take 1 tablet (75 mg total) by mouth daily with breakfast. 08/21/18   Eileen Stanford, PA-C  metoprolol succinate (TOPROL-XL) 25 MG 24 hr tablet Take 1 tablet (25 mg total) by mouth daily. Take with or immediately following a meal. 09/18/18   Nita Sells, MD  polyethylene glycol (MIRALAX / GLYCOLAX) packet Take 17 g by mouth every evening. 01/21/17   Croitoru, Mihai, MD  triamcinolone (KENALOG) 0.025 % ointment Apply 1 application topically 2 (two) times daily.    [provider]    Physical Exam: Vitals:   01/24/19 2103 01/24/19 2115  01/24/19 2130 01/24/19 2139  BP:    108/74  Pulse:      Resp:  18  18  Temp: 99 F (37.2 C)  99.3 F (37.4 C)   TempSrc: Oral  Rectal   SpO2:      Weight:      Height:        Constitutional: NAD, somnolent, jaundiced  Eyes: PERTLA, lids and conjunctivae normal ENMT: Mucous membranes are moist. Posterior pharynx clear of any exudate or lesions.   Neck: normal, supple, no masses, no thyromegaly Respiratory: no rhonchi, no wheezing, no crackles. Normal respiratory effort. No accessory muscle use.  Cardiovascular: S1 & S2 heard, regular rate and rhythm. Distal LE edema bilaterally.   Abdomen: Soft. Tender in epigastrium and RUQ. RUQ drain with scant thick brown gritty output. Thick yellow discharge from around the drain entry site. Bowel sounds active.  Musculoskeletal: no clubbing / cyanosis. No joint deformity upper and lower extremities.   Skin: no significant rashes, lesions, ulcers. Warm, dry, well-perfused. Neurologic: No facial asymmetry. PERRL. Somnolent, wakes to loud voice. Follows commands intermittently. Moving all extremities.  Psychiatric: Alert and oriented x 3. Normal mood and affect.    Labs on Admission: I have personally reviewed following labs and imaging studies  CBC: Recent Labs  Lab 01/24/19 1935  WBC 11.1*  NEUTROABS 10.0*  HGB 12.8*  HCT 36.6*  MCV 89.9  PLT 341*   Basic Metabolic Panel: Recent Labs  Lab 01/24/19 1935  NA 133*  K 3.8  CL 99  CO2 22  GLUCOSE 255*  BUN 16  CREATININE 1.11  CALCIUM 8.8*   GFR: Estimated Creatinine Clearance: 51.4 mL/min (by C-G formula based on SCr of 1.11 mg/dL). Liver Function Tests: Recent Labs  Lab 01/24/19 1935  AST 303*  ALT 267*  ALKPHOS 595*  BILITOT 4.0*  PROT 6.8  ALBUMIN 2.7*   Recent Labs  Lab 01/24/19 1935  LIPASE 51   No results for input(s): AMMONIA in the last 168 hours. Coagulation Profile: No results for input(s): INR, PROTIME in the last 168 hours. Cardiac Enzymes: No  results for input(s): CKTOTAL, CKMB, CKMBINDEX, TROPONINI in the last 168 hours. BNP (last 3 results) No results for input(s): PROBNP in the last 8760 hours. HbA1C: No results for input(s): HGBA1C in the last 72 hours. CBG: No results for input(s): GLUCAP in the last 168 hours. Lipid Profile: No results for input(s): CHOL, HDL, LDLCALC, TRIG, CHOLHDL, LDLDIRECT in the last 72 hours. Thyroid Function Tests: No results for input(s): TSH, T4TOTAL, FREET4, T3FREE, THYROIDAB in the last 72 hours. Anemia Panel: No results  for input(s): VITAMINB12, FOLATE, FERRITIN, TIBC, IRON, RETICCTPCT in the last 72 hours. Urine analysis:    Component Value Date/Time   COLORURINE YELLOW 12/12/2018 1818   APPEARANCEUR HAZY (A) 12/12/2018 1818   LABSPEC 1.017 12/12/2018 1818   PHURINE 5.0 12/12/2018 1818   GLUCOSEU NEGATIVE 12/12/2018 1818   HGBUR MODERATE (A) 12/12/2018 1818   BILIRUBINUR NEGATIVE 12/12/2018 1818   KETONESUR NEGATIVE 12/12/2018 1818   PROTEINUR NEGATIVE 12/12/2018 1818   NITRITE NEGATIVE 12/12/2018 1818   LEUKOCYTESUR MODERATE (A) 12/12/2018 1818   Sepsis Labs: @LABRCNTIP (procalcitonin:4,lacticidven:4) )No results found for this or any previous visit (from the past 240 hour(s)).   Radiological Exams on Admission: Dg Chest Port 1 View  Result Date: 01/24/2019 CLINICAL DATA:  Tachypnea and possible sepsis EXAM: PORTABLE CHEST 1 VIEW COMPARISON:  12/12/2018 FINDINGS: Cardiac shadow is enlarged. Changes of prior TAVR are again seen. Aortic calcifications are noted. The lungs are well aerated bilaterally. Mild diffuse interstitial changes are noted similar to that seen on the prior exam. Patchy atelectasis/early infiltrate is noted in the left base. No bony abnormality is seen. IMPRESSION: Patchy left basilar atelectasis/early infiltrate. Electronically Signed   By: Inez Catalina M.D.   On: 01/24/2019 20:10    EKG: Independently reviewed. Sinus tachycardia (rate 115), chronic LBBB.    Assessment/Plan   1. Severe sepsis  - Presents from nursing home with lethargy, fevers, tachycardia, N/V, and decreased output from biliary drain  - Febrile on arrival with HR 120's, SBP 80's, sat in mid-90's, and normal RR  - There is a mild leukocytosis, lactate of 2.7, CXR with atelectasis vs early infiltrate on CXR with no respiratory s/s, and acute elevation in LFT's - UA is pending, most likely a biliary source  - Blood cultures collected in ED, 250 cc NS bolus given, and he was started on empiric broad-spectrum antibiotics  - ED PA has consulted with GI and RUQ [place] is pending; the drain was changed by IR on 3/24 per notes  - Continue vancomycin, cefepime, and Flagyl, give additional small bolus and repeat lactate, follow-up RUQ [place] and consultant recommendations, and follow cultures and clinical course   2. Acute encephalopathy  - Patient has hx of dementia and has been increasingly lethargic and non-verbal at nursing facility  - No focal neurologic deficits identified - Likely secondary to infection  - Treat infection, check TSH and ammonia levels, continue supportive care   3. Chronic systolic CHF  - EF 87-56% with diffuse hypokinesis, mild-moderate MR, TAVR, and no endocarditis on TEE from February 2020  - Appears compensated  - Received small fluid bolus in ED in setting of sepsis  - Follow daily wt and I/O's    4. History of VT  - He has EF 25-30%, had ICD explanted in December 2019 d/t infection  - Continue amiodarone as tolerated   5. CAD - Chronic LBBB noted on EKG  - Antiplatelets held initially with reports of brownish vomitus   6. Hyperglycemia  - Serum glucose is 255 in ED  - A1c was only 5.3% in October  - Likely secondary to the acute illness, will follow CBG's and use a low-intensity SSI as needed     PPE: Mask, face shield, gloves DVT prophylaxis: SCD's  Code Status: Full, confirmed with daughter  Family Communication: Discussed with patient  Consults  called: Gen surgery and GI consulted by ED PA  Admission status: Inpatient; This elderly gentleman with severe comorbid disease, including EF 25-30%, is admitted with  severe sepsis and will require intensive inpatient management with specialist consultation in order to maximize his chance of surviving this.    Vianne Bulls, MD [facility] Pager [phone]  If 7PM-7AM, please contact night-coverage [email or web address] Password [facility]  01/24/2019, 9:59 PM

## 2019-01-24 NOTE — ED Notes (Signed)
In and out cath attempted and unsuccessful. RN made aware.

## 2019-01-24 NOTE — ED Notes (Signed)
Patient transported to Ultrasound 

## 2019-01-24 NOTE — ED Notes (Signed)
ED Provider at bedside. 

## 2019-01-24 NOTE — ED Provider Notes (Signed)
[place] [facility] EMERGENCY DEPARTMENT Provider Note   CSN: [id number] Arrival date & time: 01/24/19  1845    History   Chief Complaint Chief Complaint  Patient presents with   Altered Mental Status   Code Sepsis    HPI William [name] is a 85 y.o. male with h/o AAA, NICM and HF s/p ICD complicated by ESBL bacteremia from AICD infection removed (09/2018) s/p IV antibiotics, HLD, severe AS s/p TAVR, acute cholecystitis and Enterobacter cloacae bacteremia leading to severe sepsis and intubation, s/p percutaneous cholecystostomy drain 11/22/2018 s/p zosyn brought to the ED by EMS from [facility] complaining of altered mental status.  Level 5 caveat due to acuity of condition, patient is hard of hearing and does not follow commands or respond to me.  Per EMS report his drain has not been draining the usual amount for the last 2 to 3 days.  He is febrile 100.8 rectally, tachypneic, tachycardic.  We will plan on calling family members to obtain further corroborating history.  Full code per facesheet at bedside.      HPI  Past Medical History:  Diagnosis Date   AAA (abdominal aortic aneurysm) ([place])    7/13 3.8cm   Arthritis    "joints" (01/11/2014)   Asthma    "seasonal; some foods"    Chronic systolic CHF (congestive heart failure) (HCC)    Dementia (HCC)    HLD (hyperlipidemia)    HOH (hard of hearing)    Lumbar vertebral fracture (HCC)    L1- 04/11/2014    Prostate cancer (HCC)    S/P ICD (internal cardiac defibrillator) procedure, 01/11/14 removal of ERI gen and placement of Medtronic Evera XT VR & NEW Right ventricular lead Medtronic 01/12/2014   S/P TAVR (transcatheter aortic valve replacement)    Edwards Sapien 3 THV (size 29 mm, model # [id number], serial # [id number])   Severe aortic stenosis    Sleep apnea    "lost 60# & don't have it anymore" (01/11/2014)   Ventricular tachycardia [facility])     Patient Active Problem List   Diagnosis Date Noted   Biliary sepsis 01/24/2019   Hyperglycemia 00/93/8182   Acute metabolic encephalopathy 99/37/1696   LFTs abnormal    Constipation 01/08/2019   Acute cholecystitis    Cholangitis 11/25/2018   Bacteremia due to Enterobacter species 11/24/2018   Enterococcus faecalis infection 11/24/2018   Pressure injury of skin 11/22/2018   Coronary artery disease involving native coronary artery of native heart without angina pectoris 09/22/2018   Closed fracture of lower end of right ulna 09/22/2018   Acute bacterial endocarditis    Infected defibrillator (HCC)    Bacteremia due to Klebsiella pneumoniae    S/P TAVR (transcatheter aortic valve replacement) 08/18/2018   Ventricular tachycardia (HCC)    Dementia (HCC)    Chronic systolic CHF (congestive heart failure) ([place]) 08/07/2018   Severe aortic stenosis    Prostate cancer ([place]) 01/20/2014   S/P ICD (internal cardiac defibrillator) procedure, 01/11/14 removal of ERI gen and placement of Medtronic Evera XT VR & NEW Right ventricular lead Medtronic 01/12/2014   Nonischemic cardiomyopathy ([place]) 05/28/2013   Hyperlipidemia 05/28/2013   AAA (abdominal aortic aneurysm) ([place]) 05/28/2013    Past Surgical History:  Procedure Laterality Date   CARDIAC CATHETERIZATION  08/20/2004   noncritical CAD,mild global hypokinesis, EF 50%   CARDIAC DEFIBRILLATOR PLACEMENT  08/23/2004   Medtronic   CATARACT EXTRACTION W/ INTRAOCULAR LENS IMPLANT Right    COLECTOMY  1990's  CRYOABLATION N/A 02/24/2014   Procedure: CRYO ABLATION PROSTATE;  Surgeon: Ailene Rud, MD;  Location: [facility] ORS;  Service: Urology;  Laterality: N/A;   HERNIA REPAIR     "abdomen; from colon OR"   ICD LEAD REMOVAL N/A 09/15/2018   Procedure: ICD LEAD REMOVAL EXTRACTION;  Surgeon: Evans Lance, MD;  Location: [facility] OR;  Service: Cardiovascular;  Laterality: N/A;  DR. BARTLE TO BACK UP   IMPLANTABLE CARDIOVERTER DEFIBRILLATOR (ICD)  GENERATOR CHANGE N/A 01/11/2014   Procedure: ICD GENERATOR CHANGE;  Surgeon: Sanda Klein, MD;  Location: [place] CATH LAB;  Service: Cardiovascular;  Laterality: N/A;   IR EXCHANGE BILIARY DRAIN  12/18/2018   IR EXCHANGE BILIARY DRAIN  01/05/2019   IR PERC CHOLECYSTOSTOMY  11/22/2018   LEAD REVISION N/A 01/11/2014   Procedure: LEAD REVISION;  Surgeon: Sanda Klein, MD;  Location: [facility] CATH LAB;  Service: Cardiovascular;  Laterality: N/A;   NM MYOCAR PERF WALL MOTION  01/29/2012   abnormal c/o infarct/scar,no ischemia present   PROSTATE BIOPSY N/A 11/22/2013   Procedure: PROSTATE BIOPSY AND ULTRASOUND;  Surgeon: Ailene Rud, MD;  Location: [facility] ORS;  Service: Urology;  Laterality: N/A;   RIGHT/LEFT HEART CATH AND CORONARY ANGIOGRAPHY N/A 07/06/2018   Procedure: RIGHT/LEFT HEART CATH AND CORONARY ANGIOGRAPHY;  Surgeon: Troy Sine, MD;  Location: [place] CV LAB;  Service: Cardiovascular;  Laterality: N/A;   TEE WITHOUT CARDIOVERSION N/A 08/18/2018   Procedure: TRANSESOPHAGEAL ECHOCARDIOGRAM (TEE);  Surgeon: Burnell Blanks, MD;  Location: [place];  Service: Open Heart Surgery;  Laterality: N/A;   TEE WITHOUT CARDIOVERSION N/A 09/08/2018   Procedure: TRANSESOPHAGEAL ECHOCARDIOGRAM (TEE);  Surgeon: Lelon Perla, MD;  Location: [facility] ENDOSCOPY;  Service: Cardiovascular;  Laterality: N/A;   TEE WITHOUT CARDIOVERSION N/A 09/15/2018   Procedure: TRANSESOPHAGEAL ECHOCARDIOGRAM (TEE);  Surgeon: Evans Lance, MD;  Location: [facility] OR;  Service: Cardiovascular;  Laterality: N/A;   TEE WITHOUT CARDIOVERSION N/A 11/26/2018   Procedure: TRANSESOPHAGEAL ECHOCARDIOGRAM (TEE);  Surgeon: Jerline Pain, MD;  Location: [facility] ENDOSCOPY;  Service: Cardiovascular;  Laterality: N/A;   TRANSCATHETER AORTIC VALVE REPLACEMENT, TRANSFEMORAL  08/18/2018   TRANSCATHETER AORTIC VALVE REPLACEMENT, TRANSFEMORAL N/A 08/18/2018   Procedure: TRANSCATHETER AORTIC VALVE REPLACEMENT, TRANSFEMORAL using an Edwards  42m Aortic Valve;  Surgeon: MBurnell Blanks MD;  [place]  Service: Open Heart Surgery;  Laterality: N/A;        Home Medications    Prior to Admission medications   Medication Sig Start Date End Date Taking? Authorizing Provider  acetaminophen (TYLENOL) 500 MG tablet Take 500 mg by mouth every 6 (six) hours as needed (for pain).    Yes [provider]  amiodarone (PACERONE) 200 MG tablet Take 0.5 tablets (100 mg total) by mouth daily. 11/16/18  Yes Croitoru, Mihai, MD  aspirin 81 MG chewable tablet Chew 1 tablet (81 mg total) by mouth daily. 07/08/18  Yes Kroeger, KLorelee Cover, PA-C  cholecalciferol (VITAMIN D) 1000 units tablet Take 2,000 Units by mouth at bedtime.   Yes [provider]  clopidogrel (PLAVIX) 75 MG tablet Take 1 tablet (75 mg total) by mouth daily with breakfast. 08/21/18  Yes TEileen Stanford PA-C  metoprolol succinate (TOPROL-XL) 25 MG 24 hr tablet Take 1 tablet (25 mg total) by mouth daily. Take with or immediately following a meal. 09/18/18  Yes SNita Sells MD  nystatin-triamcinolone (MYCOLOG II) cream Apply 1 application topically See admin instructions. Apply to right and left buttocks twice daily for 7  days - stop date 01/25/19   Yes [provider]  polyethylene glycol (MIRALAX / GLYCOLAX) packet Take 17 g by mouth every evening. 01/21/17  Yes Croitoru, Mihai, MD  promethazine (PHENERGAN) 25 MG/ML injection Inject 12.5 mg into the muscle every 6 (six) hours as needed for nausea or vomiting.   Yes [provider]  bisacodyl (DULCOLAX) 10 MG suppository Place 1 suppository (10 mg total) rectally daily as needed for moderate constipation. 11/27/18   Ghimire, Henreitta Leber, MD  triamcinolone (KENALOG) 0.025 % ointment Apply 1 application topically 2 (two) times daily.    [provider]    Family History Family History  Problem Relation Age of Onset   Heart failure Mother        Died of "old age" at 44    Pneumonia Father     Social History Social History   Tobacco Use   Smoking status: Never Smoker   Smokeless tobacco: Never Used  Substance Use Topics   Alcohol use: Yes    Alcohol/week: 0.0 standard drinks    Comment: 01/11/2014 "drink 1/2 of a beer twice per month    Drug use: No     Allergies   Crestor [rosuvastatin]; Lipitor [atorvastatin]; and Shrimp [shellfish allergy]   Review of Systems Review of Systems  Unable to perform ROS: Acuity of condition  Constitutional: Positive for fever.  Psychiatric/Behavioral: Positive for confusion.  All other systems reviewed and are negative.    Physical Exam Updated Vital Signs BP 108/74    Pulse (!) 105    Temp 99.3 F (37.4 C) (Rectal)    Resp 18    Ht 5' 10" (1.778 m)    Wt 92 kg    SpO2 97%    BMI 29.10 kg/m   Physical Exam Vitals signs and nursing note reviewed.  Constitutional:      Appearance: He is well-developed.     Comments: Chronically ill-appearing.  Does not open eyes to verbal or physical stimuli.  HENT:     Head: Normocephalic and atraumatic.     Right Ear: External ear normal.     Left Ear: External ear normal.     Nose: Nose normal.     Mouth/Throat:     Mouth: Mucous membranes are dry.  Eyes:     General: No scleral icterus.    Conjunctiva/sclera: Conjunctivae normal.  Neck:     Musculoskeletal: Normal range of motion and neck supple.  Cardiovascular:     Rate and Rhythm: Normal rate and regular rhythm.     Heart sounds: Normal heart sounds.  Pulmonary:     Effort: Pulmonary effort is normal.     Comments: Diminished lung sounds to lower lobes anteriorly.  No wheezing, crackles.  Tachypneic. Abdominal:     General: Abdomen is flat.     Palpations: Abdomen is soft.     Tenderness: There is no abdominal tenderness.     Comments: No wincing or obvious discomfort with palpation of the abdomen.  Drain in the RUQ noted with dark brown material in the bag.  Slight erythema and purulent drainage  around the site.  No fluctuance, edema.  Musculoskeletal: Normal range of motion.        General: No deformity.  Skin:    General: Skin is warm and dry.     Capillary Refill: Capillary refill takes less than 2 seconds.     Comments: No wounds to the sacral area.  Neurological:     Mental  Status: He is alert. He is disoriented.     GCS: GCS eye subscore is 1. GCS verbal subscore is 1. GCS motor subscore is 6.     Comments: Gag reflex present.  Localizes painful stimulus with sternal rub.  Opens mouth but does not follow other commands.  Good resistance with passive movement of extremities.   Psychiatric:        Behavior: Behavior normal.        Thought Content: Thought content normal.        Judgment: Judgment normal.      ED Treatments / Results  Labs (all labs ordered are listed, but only abnormal results are displayed) Labs Reviewed  LACTIC ACID, PLASMA - Abnormal; Notable for the following components:      Result Value   Lactic Acid, Venous 2.7 (*)    All other components within normal limits  LACTIC ACID, PLASMA - Abnormal; Notable for the following components:   Lactic Acid, Venous 2.6 (*)    All other components within normal limits  COMPREHENSIVE METABOLIC PANEL - Abnormal; Notable for the following components:   Sodium 133 (*)    Glucose, Bld 255 (*)    Calcium 8.8 (*)    Albumin 2.7 (*)    AST 303 (*)    ALT 267 (*)    Alkaline Phosphatase 595 (*)    Total Bilirubin 4.0 (*)    GFR calc non Af Amer 59 (*)    All other components within normal limits  CBC WITH DIFFERENTIAL/PLATELET - Abnormal; Notable for the following components:   WBC 11.1 (*)    RBC 4.07 (*)    Hemoglobin 12.8 (*)    HCT 36.6 (*)    RDW 16.0 (*)    Platelets 146 (*)    Neutro Abs 10.0 (*)    Lymphs Abs 0.3 (*)    All other components within normal limits  URINALYSIS, ROUTINE W REFLEX MICROSCOPIC - Abnormal; Notable for the following components:   Color, Urine AMBER (*)    APPearance HAZY  (*)    Glucose, UA 50 (*)    Hgb urine dipstick SMALL (*)    Bilirubin Urine SMALL (*)    Leukocytes,Ua LARGE (*)    WBC, UA >50 (*)    All other components within normal limits  CULTURE, BLOOD (ROUTINE X 2)  CULTURE, BLOOD (ROUTINE X 2)  LIPASE, BLOOD  APTT  PROTIME-INR  TROPONIN I  LACTIC ACID, PLASMA  LACTIC ACID, PLASMA  COMPREHENSIVE METABOLIC PANEL  CBC WITH DIFFERENTIAL/PLATELET  AMMONIA  TSH    EKG EKG Interpretation  Date/Time:  Sunday January 24 2019 19:49:51 EDT Ventricular Rate:  115 PR Interval:    QRS Duration: 136 QT Interval:  405 QTC Calculation: 561 R Axis:   -68 Text Interpretation:  Sinus tachycardia Nonspecific IVCD with LAD Confirmed by Veryl Speak [phone]) on 01/24/2019 8:04:03 PM   Radiology Dg Chest Port 1 View  Result Date: 01/24/2019 CLINICAL DATA:  Tachypnea and possible sepsis EXAM: PORTABLE CHEST 1 VIEW COMPARISON:  12/12/2018 FINDINGS: Cardiac shadow is enlarged. Changes of prior TAVR are again seen. Aortic calcifications are noted. The lungs are well aerated bilaterally. Mild diffuse interstitial changes are noted similar to that seen on the prior exam. Patchy atelectasis/early infiltrate is noted in the left base. No bony abnormality is seen. IMPRESSION: Patchy left basilar atelectasis/early infiltrate. Electronically Signed   By: Inez Catalina M.D.   On: 01/24/2019 20:10   US Abdomen Limited Ruq  Result Date: 01/24/2019 CLINICAL DATA:  85 y/o  M; transaminitis. EXAM: ULTRASOUND ABDOMEN LIMITED RIGHT UPPER QUADRANT COMPARISON:  11/21/2018 CT abdomen and pelvis and abdominal ultrasound. 11/22/2018, 12/18/2018, 01/05/2019 interventional radiology cholecystostomy and cholecystostomy exchanges. FINDINGS: Gallbladder: Cholelithiasis with the largest stone measuring 9 mm near the gallbladder neck. Gallbladder wall thickening to 4.9 mm. Negative sonographic Murphy's sign. No pericholecystic fluid. Linear echogenic structure likely representing the  cholecystostomy tube is partially visualized within gallbladder fossa, limited evaluation due to overlying bandaging. Common bile duct: Diameter: 7.3 mm Liver: No focal lesion identified. Within normal limits in parenchymal echogenicity. Portal vein is patent on color Doppler imaging with normal direction of blood flow towards the liver. IMPRESSION: 1. Exam limited due to overlying bandaging in the right upper quadrant. 2. Mild gallbladder wall thickening, probably chronic sequelae of cholecystitis and cholecystostomy tube. Negative sonographic Murphy's sign. Cholelithiasis again noted. 3. No significant intra or extrahepatic biliary ductal dilatation identified. Electronically Signed   By: Kristine Garbe M.D.   On: 01/24/2019 22:25    Procedures .Critical Care Performed by: Kinnie Feil, PA-C Authorized by: Kinnie Feil, PA-C   Critical care provider statement:    Critical care time (minutes):  45   Critical care was necessary to treat or prevent imminent or life-threatening deterioration of the following conditions:  Sepsis (multiple consults)   Critical care was time spent personally by me on the following activities:  Discussions with consultants, evaluation of patient's response to treatment, examination of patient, ordering and performing treatments and interventions, ordering and review of laboratory studies, ordering and review of radiographic studies, pulse oximetry, re-evaluation of patient's condition, obtaining history from patient or surrogate and review of old charts   I assumed direction of critical care for this patient from another provider in my specialty: no     (including critical care time)  Medications Ordered in ED Medications  vancomycin (VANCOCIN) 1,500 mg in sodium chloride 0.9 % 500 mL IVPB (has no administration in time range)  amiodarone (PACERONE) tablet 100 mg (has no administration in time range)  insulin aspart (novoLOG) injection 0-9 Units  (has no administration in time range)  sodium chloride flush (NS) 0.9 % injection 3 mL (has no administration in time range)  sodium chloride flush (NS) 0.9 % injection 3 mL (has no administration in time range)  sodium chloride flush (NS) 0.9 % injection 3 mL (has no administration in time range)  0.9 %  sodium chloride infusion (has no administration in time range)  acetaminophen (TYLENOL) tablet 650 mg (has no administration in time range)    Or  acetaminophen (TYLENOL) suppository 650 mg (has no administration in time range)  ondansetron (ZOFRAN) tablet 4 mg (has no administration in time range)    Or  ondansetron (ZOFRAN) injection 4 mg (has no administration in time range)  metroNIDAZOLE (FLAGYL) IVPB 500 mg (has no administration in time range)  pantoprazole (PROTONIX) injection 40 mg (has no administration in time range)  sodium chloride 0.9 % bolus 250 mL (has no administration in time range)  ceFEPIme (MAXIPIME) 2 g in sodium chloride 0.9 % 100 mL IVPB (has no administration in time range)  vancomycin (VANCOCIN) 2,000 mg in sodium chloride 0.9 % 500 mL IVPB (2,000 mg Intravenous New Bag/Given 01/24/19 2032)  piperacillin-tazobactam (ZOSYN) IVPB 3.375 g (0 g Intravenous Stopped 01/24/19 2017)  acetaminophen (TYLENOL) suppository 650 mg (650 mg Rectal Given 01/24/19 2020)  sodium chloride 0.9 % bolus 250 mL (0 mLs Intravenous Stopped  01/24/19 2021)     Initial Impression / Assessment and Plan / ED Course  I have reviewed the triage vital signs and the nursing notes.  Pertinent labs & imaging results that were available during my care of the patient were reviewed by me and considered in my medical decision making (see chart for details).  Clinical Course as of Jan 24 2252  Nancy Fetter Jan 24, 2019  2034 AST(!): 303 [CG]  2035 ALT(!): 267 [CG]  2035 Alkaline Phosphatase(!): 595 [CG]  2035 Total Bilirubin(!): 4.0 [CG]  2035 IMPRESSION: Patchy left basilar atelectasis/early infiltrate.  DG  Chest Port 1 View [CG]  2035 Temp(!): 100.8 F (38.2 C) [CG]  2035 Pulse Rate(!): 126 [CG]  2035 Resp(!): 23 [CG]  2124 Lactic Acid, Venous(!!): 2.6 [CG]  2125 WBC(!): 11.1 [CG]  2126 Total Bilirubin(!): 4.0 [CG]    Clinical Course User Index [CG] Kinnie Feil, PA-C       Febrile, tachycardia, somnolent, decreased GCS, tachypnic. SBP ~100s MAP > 70.  SIRS criteria met with source most likely from chole drain/cholangitis vs PNA vs UTI.  Patient evaluated in context of global pandemic COVID-19 and this could be a source as well.  H/o HF with EF 25-30%.  Will give broad spectrum abx, gentle hydration given h/o HF, tachypnea and somewhat stable/soft Bps but no hypotension.   2124: Labs remarkable for mild leukocytosis and lactic acidosis.  Markedly elevated LFTs with a total bilirubin of 4.0.  Chest x-ray with new early infiltrate.  UA pending.  I have spoken with general surgery Dr. Dema Severin who agrees with right upper quadrant ultrasound to confirm placement of the drain but recommends GI consult for likely emergent ERCP.  Hospitalist consult pending.  2250: Spoke to GI Dr Tarri Glenn who recommends IR consult to determine if drain needs replacement. GI will see in the AM.  In and out UA with large leuk, > 50 WBCs, small bili.  admitted by Dr Myna Hidalgo.   Final Clinical Impressions(s) / ED Diagnoses   Final diagnoses:  Transaminitis    ED Discharge Orders    None       Arlean Hopping 01/24/19 2253    Veryl Speak, MD 01/24/19 [phone]

## 2019-01-24 NOTE — Telephone Encounter (Signed)
William [name] called me around 5:45pm to let me know that William [name] had been nauseous all day, intake poor.  He had not had anything since around 2pm and only 2 cups of milk and 2 cups of water early this morning.  He is now lethargic, slumped over, clammy and pale.  He has vomited twice--brown material and food.  The second time, it came through his nose. Vitals included T100.7 axillary, HR 134, RR 24, BP 170/78 manually.    Background:  Pt with dementia with biliary drain for cholelithiasis and cholangitis that led to sepsis.  He previously had sepsis secondary to UTI and then infection of his ICD leads--it was removed.  He had it due to VT--he's on amiodarone at this time.  He is a full code at his wife's request.  She is currently requesting IVFs and labs as soon as possible.  Unfortunately, the skilled nursing facility is not an ICU and cannot run tests stat and frequently recheck them as is required for someone with sepsis and goals of care consistent with full resuscitative measures.  Also, pt's biliary drain has had declining output upon review:  4/1 150cc, 4/2 145cc, 4/3 120cc, then 20, 30, 85, 55, 25, and most recently 5, 7, 10, 7.5cc b/w 4/9 and 4/12.    Unfortunately, given his goals of care and prognosis if not able to receive prompt picc placement, IVFs and abs, I had to refer him to the ED for management even in this time of covid-19.    Regardless, I do recommend a palliative care consult early in his stay.  This poor man has been suffering with multiple hospitalizations and dementia requiring assistance with all ADLs.  William [name], D.O. [facility] [address], [place] [address] Cell Phone (Mon-Fri 8am-5pm):  [phone] On Call:  [phone] & follow prompts after 5pm & weekends Office Phone:  [phone] Office Fax:  [phone]

## 2019-01-24 NOTE — ED Notes (Signed)
Daughter, alison Seider, would like an update as soon as possible at [phone]

## 2019-01-24 NOTE — ED Notes (Addendum)
ED TO INPATIENT HANDOFF REPORT  ED Nurse Name and Phone #:  Dalphine Handing RN [phone]  S Name/Age/Gender William [name] 85 y.o. male Room/Bed: 025C/025C  Code Status   Code Status: Prior  Home/SNF/Other Nursing Home Patient oriented to: self Is this baseline? No   Triage Complete: Triage complete  Chief Complaint sepsis  Triage Note Pt arrives with [facility] from [facility] c/o altered mental status and possibly septic. Pt is HOH and has abdominal drain "that has not draining the usual amount" per EMS per the facility. Pt's temp on arrival 100.8 rectally, and was tachypnic. Pt is not oriented and is somnolent.    Allergies Allergies  Allergen Reactions  . Crestor [Rosuvastatin] Other (See Comments)    Hurts muscles  . Lipitor [Atorvastatin] Other (See Comments)    Hurts stomach  . Shrimp [Shellfish Allergy] Other (See Comments)    On MAR    Level of Care/Admitting Diagnosis ED Disposition    ED Disposition Condition [facility] Area: [place] [[id number]]  Level of Care: Progressive [102]  Diagnosis: Biliary sepsis [798921]  Admitting Physician: Vianne Bulls [[id number]]  Attending Physician: Vianne Bulls [[id number]]  Estimated length of stay: past midnight tomorrow  Certification:: I certify this patient will need inpatient services for at least 2 midnights  PT Class (Do Not Modify): Inpatient [101]  PT Acc Code (Do Not Modify): Private [1]       B Medical/Surgery History Past Medical History:  Diagnosis Date  . AAA (abdominal aortic aneurysm) ([place])    7/13 3.8cm  . Arthritis    "joints" (01/11/2014)  . Asthma    "seasonal; some foods"   . Chronic systolic CHF (congestive heart failure) ([place])   . Dementia ([place])   . HLD (hyperlipidemia)   . HOH (hard of hearing)   . Lumbar vertebral fracture ([facility])    L1- 04/11/2014   . Prostate cancer ([place])   . S/P ICD (internal cardiac defibrillator)  procedure, 01/11/14 removal of ERI gen and placement of Medtronic Evera XT VR & NEW Right ventricular lead Medtronic 01/12/2014  . S/P TAVR (transcatheter aortic valve replacement)    Edwards Sapien 3 THV (size 29 mm, model # [id number], serial # [id number])  . Severe aortic stenosis   . Sleep apnea    "lost 60# & don't have it anymore" (01/11/2014)  . Ventricular tachycardia [facility])    Past Surgical History:  Procedure Laterality Date  . CARDIAC CATHETERIZATION  08/20/2004   noncritical CAD,mild global hypokinesis, EF 50%  . CARDIAC DEFIBRILLATOR PLACEMENT  08/23/2004   Medtronic  . CATARACT EXTRACTION W/ INTRAOCULAR LENS IMPLANT Right   . COLECTOMY  1990's  . CRYOABLATION N/A 02/24/2014   Procedure: CRYO ABLATION PROSTATE;  Surgeon: Ailene Rud, MD;  Location: [facility] ORS;  Service: Urology;  Laterality: N/A;  . HERNIA REPAIR     "abdomen; from colon OR"  . ICD LEAD REMOVAL N/A 09/15/2018   Procedure: ICD LEAD REMOVAL EXTRACTION;  Surgeon: Evans Lance, MD;  Location: [facility] OR;  Service: Cardiovascular;  Laterality: N/A;  DR. BARTLE TO BACK UP  . IMPLANTABLE CARDIOVERTER DEFIBRILLATOR (ICD) GENERATOR CHANGE N/A 01/11/2014   Procedure: ICD GENERATOR CHANGE;  Surgeon: Sanda Klein, MD;  Location: [place] CATH LAB;  Service: Cardiovascular;  Laterality: N/A;  . IR EXCHANGE BILIARY DRAIN  12/18/2018  . IR EXCHANGE BILIARY DRAIN  01/05/2019  . IR PERC CHOLECYSTOSTOMY  11/22/2018  . LEAD REVISION N/A  01/11/2014   Procedure: LEAD REVISION;  Surgeon: Sanda Klein, MD;  Location: [place] CATH LAB;  Service: Cardiovascular;  Laterality: N/A;  . NM MYOCAR PERF WALL MOTION  01/29/2012   abnormal c/o infarct/scar,no ischemia present  . PROSTATE BIOPSY N/A 11/22/2013   Procedure: PROSTATE BIOPSY AND ULTRASOUND;  Surgeon: Ailene Rud, MD;  Location: [facility];  Service: Urology;  Laterality: N/A;  . RIGHT/LEFT HEART CATH AND CORONARY ANGIOGRAPHY N/A 07/06/2018   Procedure: RIGHT/LEFT HEART CATH AND CORONARY  ANGIOGRAPHY;  Surgeon: Troy Sine, MD;  Location: [place] CV LAB;  Service: Cardiovascular;  Laterality: N/A;  . TEE WITHOUT CARDIOVERSION N/A 08/18/2018   Procedure: TRANSESOPHAGEAL ECHOCARDIOGRAM (TEE);  Surgeon: Burnell Blanks, MD;  Location: [place];  Service: Open Heart Surgery;  Laterality: N/A;  . TEE WITHOUT CARDIOVERSION N/A 09/08/2018   Procedure: TRANSESOPHAGEAL ECHOCARDIOGRAM (TEE);  Surgeon: Lelon Perla, MD;  Location: [facility] ENDOSCOPY;  Service: Cardiovascular;  Laterality: N/A;  . TEE WITHOUT CARDIOVERSION N/A 09/15/2018   Procedure: TRANSESOPHAGEAL ECHOCARDIOGRAM (TEE);  Surgeon: Evans Lance, MD;  Location: [facility] OR;  Service: Cardiovascular;  Laterality: N/A;  . TEE WITHOUT CARDIOVERSION N/A 11/26/2018   Procedure: TRANSESOPHAGEAL ECHOCARDIOGRAM (TEE);  Surgeon: Jerline Pain, MD;  Location: [facility] ENDOSCOPY;  Service: Cardiovascular;  Laterality: N/A;  . TRANSCATHETER AORTIC VALVE REPLACEMENT, TRANSFEMORAL  08/18/2018  . TRANSCATHETER AORTIC VALVE REPLACEMENT, TRANSFEMORAL N/A 08/18/2018   Procedure: TRANSCATHETER AORTIC VALVE REPLACEMENT, TRANSFEMORAL using an Edwards 75mm Aortic Valve;  Surgeon: Burnell Blanks, MD;  Location: [place];  Service: Open Heart Surgery;  Laterality: N/A;     A IV Location/Drains/Wounds Patient Lines/Drains/Airways Status   Active Line/Drains/Airways    Name:   Placement date:   Placement time:   Site:   Days:   Peripheral IV 01/24/19 Left Hand   01/24/19    1904    Hand   less than 1   Peripheral IV 01/24/19 Right Hand   01/24/19    2003    Hand   less than 1   PICC Single Lumen 11/27/18 PICC Right Brachial 40 cm 0 cm   11/27/18    1530    Brachial   58   Biliary Tube Cook slip-coat 12 Fr. RUQ   01/05/19    1342    RUQ   19   External Urinary Catheter   11/24/18    1117    -   61   Incision (Closed) 09/15/18 Chest Left   09/15/18    1852     131   Incision (Closed) 09/15/18 Groin Left   09/15/18    1912     131   Incision  (Closed) 09/15/18 Groin Right   09/15/18    1912     131   Pressure Injury 11/21/18 Stage II -  Partial thickness loss of dermis presenting as a shallow open ulcer with a red, pink wound bed without slough. open    11/21/18    0900     64          Intake/Output Last 24 hours No intake or output data in the 24 hours ending 01/24/19 2152  Labs/Imaging Results for orders placed or performed during the hospital encounter of 01/24/19 (from the past 48 hour(s))  Lactic acid, plasma     Status: Abnormal   Collection Time: 01/24/19  7:35 PM  Result Value Ref Range   Lactic Acid, Venous 2.7 (HH) 0.5 - 1.9 mmol/L  Comment: CRITICAL RESULT CALLED TO, READ BACK BY AND VERIFIED WITH: Lucendia Herrlich AT 2007 01/24/2019 BY L BENFIELD Performed at [facility], [place] [address], [place], [place] [address]   Comprehensive metabolic panel     Status: Abnormal   Collection Time: 01/24/19  7:35 PM  Result Value Ref Range   Sodium 133 (L) 135 - 145 mmol/L   Potassium 3.8 3.5 - 5.1 mmol/L   Chloride 99 98 - 111 mmol/L   CO2 22 22 - 32 mmol/L   Glucose, Bld 255 (H) 70 - 99 mg/dL   BUN 16 8 - 23 mg/dL   Creatinine, Ser 1.11 0.61 - 1.24 mg/dL   Calcium 8.8 (L) 8.9 - 10.3 mg/dL   Total Protein 6.8 6.5 - 8.1 g/dL   Albumin 2.7 (L) 3.5 - 5.0 g/dL   AST 303 (H) 15 - 41 U/L   ALT 267 (H) 0 - 44 U/L   Alkaline Phosphatase 595 (H) 38 - 126 U/L   Total Bilirubin 4.0 (H) 0.3 - 1.2 mg/dL   GFR calc non Af Amer 59 (L) >60 mL/min   GFR calc Af Amer >60 >60 mL/min   Anion gap 12 5 - 15    Comment: Performed at [facility], [place] [address], [place], [place] [address]  CBC WITH DIFFERENTIAL     Status: Abnormal   Collection Time: 01/24/19  7:35 PM  Result Value Ref Range   WBC 11.1 (H) 4.0 - 10.5 K/uL   RBC 4.07 (L) 4.22 - 5.81 MIL/uL   Hemoglobin 12.8 (L) 13.0 - 17.0 g/dL   HCT 36.6 (L) 39.0 - 52.0 %   MCV 89.9 80.0 - 100.0 fL   MCH 31.4 26.0 - 34.0 pg   MCHC 35.0 30.0 - 36.0 g/dL   RDW 16.0 (H)  11.5 - 15.5 %   Platelets 146 (L) 150 - 400 K/uL   nRBC 0.0 0.0 - 0.2 %   Neutrophils Relative % 90 %   Neutro Abs 10.0 (H) 1.7 - 7.7 K/uL   Lymphocytes Relative 3 %   Lymphs Abs 0.3 (L) 0.7 - 4.0 K/uL   Monocytes Relative 6 %   Monocytes Absolute 0.7 0.1 - 1.0 K/uL   Eosinophils Relative 0 %   Eosinophils Absolute 0.0 0.0 - 0.5 K/uL   Basophils Relative 0 %   Basophils Absolute 0.0 0.0 - 0.1 K/uL   Immature Granulocytes 1 %   Abs Immature Granulocytes 0.05 0.00 - 0.07 K/uL    Comment: Performed at [facility], [place] [address] [place], [place] [address]  Lipase, blood     Status: None   Collection Time: 01/24/19  7:35 PM  Result Value Ref Range   Lipase 51 11 - 51 U/L    Comment: Performed at [facility] [address], [place], [place] [address]  Lactic acid, plasma     Status: Abnormal   Collection Time: 01/24/19  8:52 PM  Result Value Ref Range   Lactic Acid, Venous 2.6 (HH) 0.5 - 1.9 mmol/L    Comment: CRITICAL RESULT CALLED TO, READ BACK BY AND VERIFIED WITH: Francene Finders 01/24/19 2123 WAYK Performed at [facility], [place] [address] [place], [place] [address]    Dg Chest Port 1 View  Result Date: 01/24/2019 CLINICAL DATA:  Tachypnea and possible sepsis EXAM: PORTABLE CHEST 1 VIEW COMPARISON:  12/12/2018 FINDINGS: Cardiac shadow is enlarged. Changes of prior TAVR are again seen. Aortic  calcifications are noted. The lungs are well aerated bilaterally. Mild diffuse interstitial changes are noted similar to that seen on the prior exam. Patchy atelectasis/early infiltrate is noted in the left base. No bony abnormality is seen. IMPRESSION: Patchy left basilar atelectasis/early infiltrate. Electronically Signed   By: Inez Catalina M.D.   On: 01/24/2019 20:10    Pending Labs Unresulted Labs (From admission, onward)    Start     Ordered   01/24/19 2126  Troponin I - ONCE - STAT  ONCE - STAT,   STAT     01/24/19 2125   01/24/19 1917  Blood Culture (routine x 2)   BLOOD CULTURE X 2,   STAT     01/24/19 1917   01/24/19 1917  Urinalysis, Routine w reflex microscopic  ONCE - STAT,   STAT     01/24/19 1917          Vitals/Pain Today's Vitals   01/24/19 2115 01/24/19 2130 01/24/19 2130 01/24/19 2141  BP:      Pulse:      Resp: 18     Temp:   99.3 F (37.4 C)   TempSrc:   Rectal   SpO2:      Weight:      Height:      PainSc:  0-No pain  0-No pain    Isolation Precautions No active isolations  Medications Medications  vancomycin (VANCOCIN) 2,000 mg in sodium chloride 0.9 % 500 mL IVPB (2,000 mg Intravenous New Bag/Given 01/24/19 2032)  vancomycin (VANCOCIN) 1,500 mg in sodium chloride 0.9 % 500 mL IVPB (has no administration in time range)  piperacillin-tazobactam (ZOSYN) IVPB 3.375 g (0 g Intravenous Stopped 01/24/19 2017)  acetaminophen (TYLENOL) suppository 650 mg (650 mg Rectal Given 01/24/19 2020)  sodium chloride 0.9 % bolus 250 mL (0 mLs Intravenous Stopped 01/24/19 2021)    Mobility  High fall risk   Focused Assessments infection   R Recommendations: See Admitting Provider Note  Report given to:   Additional Notes:

## 2019-01-24 NOTE — ED Notes (Addendum)
Daughter: Ebony Hail [phone] updated.

## 2019-01-24 NOTE — ED Triage Notes (Signed)
Pt arrives with [facility] from [facility] c/o altered mental status and possibly septic. Pt is HOH and has abdominal drain "that has not draining the usual amount" per EMS per the facility. Pt's temp on arrival 100.8 rectally, and was tachypnic. Pt is not oriented and is somnolent.

## 2019-01-25 ENCOUNTER — Telehealth: Payer: Self-pay | Admitting: *Deleted

## 2019-01-25 ENCOUNTER — Inpatient Hospital Stay (HOSPITAL_COMMUNITY): Payer: Medicare Other

## 2019-01-25 ENCOUNTER — Encounter (HOSPITAL_COMMUNITY): Payer: Self-pay | Admitting: Diagnostic Radiology

## 2019-01-25 DIAGNOSIS — F0150 Vascular dementia without behavioral disturbance: Secondary | ICD-10-CM

## 2019-01-25 DIAGNOSIS — J9602 Acute respiratory failure with hypercapnia: Secondary | ICD-10-CM

## 2019-01-25 DIAGNOSIS — R6521 Severe sepsis with septic shock: Secondary | ICD-10-CM

## 2019-01-25 DIAGNOSIS — G4733 Obstructive sleep apnea (adult) (pediatric): Secondary | ICD-10-CM

## 2019-01-25 HISTORY — PX: IR EXCHANGE BILIARY DRAIN: IMG6046

## 2019-01-25 LAB — CBC WITH DIFFERENTIAL/PLATELET
Abs Immature Granulocytes: 0.02 10*3/uL (ref 0.00–0.07)
Basophils Absolute: 0 10*3/uL (ref 0.0–0.1)
Basophils Relative: 0 %
Eosinophils Absolute: 0 10*3/uL (ref 0.0–0.5)
Eosinophils Relative: 0 %
HCT: 35.9 % — ABNORMAL LOW (ref 39.0–52.0)
Hemoglobin: 12 g/dL — ABNORMAL LOW (ref 13.0–17.0)
Immature Granulocytes: 0 %
Lymphocytes Relative: 5 %
Lymphs Abs: 0.6 10*3/uL — ABNORMAL LOW (ref 0.7–4.0)
MCH: 30 pg (ref 26.0–34.0)
MCHC: 33.4 g/dL (ref 30.0–36.0)
MCV: 89.8 fL (ref 80.0–100.0)
Monocytes Absolute: 0.9 10*3/uL (ref 0.1–1.0)
Monocytes Relative: 9 %
Neutro Abs: 9 10*3/uL — ABNORMAL HIGH (ref 1.7–7.7)
Neutrophils Relative %: 86 %
Platelets: 132 10*3/uL — ABNORMAL LOW (ref 150–400)
RBC: 4 MIL/uL — ABNORMAL LOW (ref 4.22–5.81)
RDW: 15.9 % — ABNORMAL HIGH (ref 11.5–15.5)
WBC: 10.6 10*3/uL — ABNORMAL HIGH (ref 4.0–10.5)
nRBC: 0 % (ref 0.0–0.2)

## 2019-01-25 LAB — COMPREHENSIVE METABOLIC PANEL
ALT: 263 U/L — ABNORMAL HIGH (ref 0–44)
AST: 252 U/L — ABNORMAL HIGH (ref 15–41)
Albumin: 2.5 g/dL — ABNORMAL LOW (ref 3.5–5.0)
Alkaline Phosphatase: 549 U/L — ABNORMAL HIGH (ref 38–126)
Anion gap: 12 (ref 5–15)
BUN: 15 mg/dL (ref 8–23)
CO2: 23 mmol/L (ref 22–32)
Calcium: 8.5 mg/dL — ABNORMAL LOW (ref 8.9–10.3)
Chloride: 103 mmol/L (ref 98–111)
Creatinine, Ser: 1.19 mg/dL (ref 0.61–1.24)
GFR calc Af Amer: >60 mL/min (ref >60)
GFR calc non Af Amer: 54 mL/min — ABNORMAL LOW (ref >60)
Glucose, Bld: 151 mg/dL — ABNORMAL HIGH (ref 70–99)
Potassium: 3.7 mmol/L (ref 3.5–5.1)
Sodium: 138 mmol/L (ref 135–145)
Total Bilirubin: 4.9 mg/dL — ABNORMAL HIGH (ref 0.3–1.2)
Total Protein: 6.4 g/dL — ABNORMAL LOW (ref 6.5–8.1)

## 2019-01-25 LAB — LACTIC ACID, PLASMA
Lactic Acid, Venous: 2.3 mmol/L (ref 0.5–1.9)
Lactic Acid, Venous: 1.8 mmol/L (ref 0.5–1.9)

## 2019-01-25 LAB — GLUCOSE, CAPILLARY
Glucose-Capillary: 122 mg/dL — ABNORMAL HIGH (ref 70–99)
Glucose-Capillary: 104 mg/dL — ABNORMAL HIGH (ref 70–99)
Glucose-Capillary: 103 mg/dL — ABNORMAL HIGH (ref 70–99)
Glucose-Capillary: 104 mg/dL — ABNORMAL HIGH (ref 70–99)
Glucose-Capillary: 100 mg/dL — ABNORMAL HIGH (ref 70–99)

## 2019-01-25 LAB — TSH: TSH: 1.043 u[IU]/mL (ref 0.350–4.500)

## 2019-01-25 LAB — BILIRUBIN, DIRECT: Bilirubin, Direct: 3 mg/dL — ABNORMAL HIGH (ref 0.0–0.2)

## 2019-01-25 LAB — CBG MONITORING, ED: Glucose-Capillary: 185 mg/dL — ABNORMAL HIGH (ref 70–99)

## 2019-01-25 LAB — AMMONIA: Ammonia: 26 umol/L (ref 9–35)

## 2019-01-25 MED ORDER — LIDOCAINE HCL (PF) 1 % IJ SOLN
INTRAMUSCULAR | Status: DC | PRN
  Administered 2019-01-25: 5 mL

## 2019-01-25 MED ORDER — SODIUM CHLORIDE 0.9% FLUSH
5.0000 mL | Freq: Three times a day (TID) | INTRAVENOUS | Status: DC
  Administered 2019-01-25 – 2019-02-08 (×41): 5 mL

## 2019-01-25 MED ORDER — LIDOCAINE HCL 1 % IJ SOLN
INTRAMUSCULAR | Status: AC
  Filled 2019-01-25: qty 20

## 2019-01-25 MED ORDER — IOHEXOL 300 MG/ML  SOLN: 10.0000 mL | Freq: Once | INTRAMUSCULAR | Status: DC | PRN

## 2019-01-25 MED ORDER — SODIUM CHLORIDE 0.9 % IV BOLUS
250.0000 mL | Freq: Once | INTRAVENOUS | Status: AC
  Administered 2019-01-25: 250 mL via INTRAVENOUS

## 2019-01-25 NOTE — Telephone Encounter (Signed)
Patient daughter called to report that her father is in the hospital again. She would like for [facility], [place] or [place] to check in as she is afraid he may slip through the cracks and no one is there speak for him. Advised her we usually are not involved until they request our help but I will let the providers know she has concerns and would like to hear from someone or at least have someone check in on her father.   Advised her I will call her back either way.

## 2019-01-25 NOTE — Progress Notes (Signed)
PT arrived via stretcher from ED. REsp even and unlabored on room air. Pt responds verbally very little. Did state his name and birthday. Otherwise opens eyes and looks at you when you ask a question but does not try to speak. Pt has large amount busing especially right arm. PT has biliary drain intact to rt abdomen.

## 2019-01-25 NOTE — Progress Notes (Signed)
PROGRESS NOTE    William [name] Hagos  ZOX:[id number] DOB: 1928/08/11 DOA: 01/24/2019 PCP: Gayland Curry, DO   Brief Narrative:  85 y.o. WM PMHx Dementia, AAA, aortic stenosis status post TAVR, chronic systolic CHF pulmonary nodules, chronic systolic CHF, history of VT with AICD that was removed due to infection, cholecystitis and cholangitis with biliary drain in place, debility at SNF, now presenting to the emergency department for evaluation of lethargy, fevers, and vomiting.  Patient is unable to contribute much to the history due to his clinical condition.  Per report of the patient's daughter and nursing facility personnel, there has been decreasing output from the patient's biliary drain and he has developed nausea over the past couple days with very poor intake, progressing to lethargy today.  He was reportedly pale, slumped over, and vomiting some brownish material.  He was noted to have a fever to 100.7 F, tachycardic in the 130s, and blood pressure stable initially.  He has not been coughing and has not appeared dyspneic or requiring any oxygen per report.  He was sent into the ED for evaluation of his change in condition.   ED Course: Upon arrival to the ED, patient is found to be febrile to 38.2 C, saturating well on room air, tachycardic in the 120s, initial blood pressure 83 systolic, and normal respiratory rate.  EKG features a sinus tachycardia with chronic LBBB.  Chest x-ray is notable for patchy left basilar atelectasis versus early infiltrate.  Chemistry panel notable for sodium 133, glucose 255, alkaline phosphatase 595, AST and ALT in the 300 range, and total bilirubin of 4.  CBC features a leukocytosis to 11,100 and a slight thrombocytopenia.  Lactic acid is elevated to 2.7.  Blood cultures were collected in the ED, 250 cc normal saline bolus was given, and the patient was treated with vancomycin and Zosyn.  General surgery was consulted by the ED clinician, recommended consultation  with GI, ED PA reports that she is awaiting for callback from GI at this time and requests a medical admission.    Subjective: 4/13 patient is just returned from procedure asleep.  Does not appear to be any respiratory distress.   Assessment & Plan:   Principal Problem:   Biliary sepsis Active Problems:   Chronic systolic CHF (congestive heart failure) (HCC)   Ventricular tachycardia (HCC)   Dementia (HCC)   Coronary artery disease involving native coronary artery of native heart without angina pectoris   Hyperglycemia   Acute metabolic encephalopathy  Severe sepsis - Upon admission patient met guidelines for sepsis temp> 30 C, HR> 90, RR> 20 and site of infection occluded biliary drain. -Most likely secondary to occluded biliary drain.  However patient has been pancultured - Continue broad-spectrum antibiotics. - Per admission note IR changed drain on 3/24  Cholelithiasis/occluded cholecystostomy tube - 4/13 cholecystostomy tube exchanged - Numerous stones removed along with purulent drainage which was sent for culture. - Cystic duct occluded see results below - CCS feels patient poor candidate at this time for surgery. -If can stabilize patient may be able to obtain ERCP vs Cholelithiasis which patient actually requires.  Informed daughter Ebony Hail that unlikely to happen this hospitalization.  Acute encephalopathy - Patient just returned from the OR unable to evaluate. - Per admission note baseline dementia, with negative neurological deficits. - Most likely secondary to infection. - TSH WNL, - Ammonia level WNL   Chronic systolic CHF -EF 25 to 81% with diffuse hypokinesis, mild to moderate MR, TAVR -Echocardiogram  February 2020: EF 25-30% with diffuse hypokinesis, mild-moderate MR, TAVR, negative endocarditis   -Strict in and out -159m -Daily weight Filed Weights   01/24/19 1900 01/24/19 2022 01/25/19 0131  Weight: 92 kg 92 kg 91.6 kg   CAD - Chronic LBBB noted  on EKG - See chronic systolic CHF  OSA - CPAP per respiratory  Hx DVT - Currently NSR - Patient had AICD but was removed December 2019 due to infection   Hyperglycemia - Serum glucose in the ED 255 - Hemoglobin A1c pending -Lipid panel pending - sensitive SSI    Goals of care - 4/13 consult palliative care reviewing GIs plan, patient is at high risk for increased morbidity/mortality.  Change of code to DNR, if procedure unsuccessful hospice?    DVT prophylaxis: CD Code Status: Full Family Communication: Spoke with ACephus Richer(daughter) discussed extensively patient's recent hospitalizations and current treatment plan. Disposition Plan: TBD   Consultants:  GI CCS ID    Procedures/Significant Events:  4/12 PCXR: LEFT basilar atelectasis/early infiltrate 4/12 UKoreaabdomen RUQ:-Mild gallbladder wall thickening, probably chronic sequelae of cholecystitis and cholecystostomy tube.  -Cholelithiasis again noted -No intra-/extrahepatic biliary ductal dilation noted  4/13 cholecystostomy tube exchange with fluoroscopy:-Innumerable gallstones. Unable to opacify the common bile duct due to obstruction of the cystic duct. -Purulent looking fluid removed from the gallbladder. Fluid sent for culture.      I have personally reviewed and interpreted all radiology studies and my findings are as above.  VENTILATOR SETTINGS:    Cultures 4/12 blood RIGHT antecubital NGTD 4/12 blood counts right hand NGTD  4/13 bile pending    Antimicrobials: Anti-infectives (From admission, onward)   Start     Stop   01/25/19 2200  vancomycin (VANCOCIN) 1,500 mg in sodium chloride 0.9 % 500 mL IVPB         01/25/19 0200  ceFEPIme (MAXIPIME) 2 g in sodium chloride 0.9 % 100 mL IVPB         01/24/19 2200  ceFEPIme (MAXIPIME) 2 g in sodium chloride 0.9 % 100 mL IVPB  Status:  Discontinued     01/24/19 2208   01/24/19 2200  metroNIDAZOLE (FLAGYL) IVPB 500 mg         01/24/19 1945   piperacillin-tazobactam (ZOSYN) IVPB 3.375 g     01/24/19 2017   01/24/19 1930  ceFEPIme (MAXIPIME) 2 g in sodium chloride 0.9 % 100 mL IVPB  Status:  Discontinued     01/24/19 2010   01/24/19 1930  metroNIDAZOLE (FLAGYL) IVPB 500 mg  Status:  Discontinued     01/24/19 1931   01/24/19 1930  vancomycin (VANCOCIN) IVPB 1000 mg/200 mL premix  Status:  Discontinued     01/24/19 1922   01/24/19 1930  vancomycin (VANCOCIN) 2,000 mg in sodium chloride 0.9 % 500 mL IVPB     01/25/19 0012       Devices    LINES / TUBES:  Cholecystostomy tube/13>>    Continuous Infusions:  sodium chloride 10 mL/hr at 01/25/19 0007   ceFEPime (MAXIPIME) IV 2 g (01/25/19 0239)   metronidazole 500 mg (01/25/19 0555)   vancomycin       Objective: Vitals:   01/25/19 0256 01/25/19 0400 01/25/19 0435 01/25/19 0542  BP: 103/73 102/67    Pulse:      Resp:      Temp:   98.8 F (37.1 C) (!) 93.4 F (34.1 C)  TempSrc:      SpO2:  Weight:      Height:        Intake/Output Summary (Last 24 hours) at 01/25/2019 0741 Last data filed at 01/25/2019 0600 Gross per 24 hour  Intake 450 ml  Output --  Net 450 ml   Filed Weights   01/24/19 1900 01/24/19 2022 01/25/19 0131  Weight: 92 kg 92 kg 91.6 kg    Examination:  General: Patient A/O x0 (just returned from OR) no acute respiratory distress Eyes: negative scleral hemorrhage, negative anisocoria, negative icterus ENT: Negative Runny nose, negative gingival bleeding, Neck:  Negative scars, masses, torticollis, lymphadenopathy, JVD Lungs: Clear to auscultation bilaterally without wheezes or crackles Cardiovascular: Regular rate and rhythm without murmur gallop or rub normal S1 and S2 Abdomen: negative abdominal pain, nondistended, positive soft, bowel sounds, no rebound, no ascites, no appreciable mass, New Cholecystostomy bag in place draining bilious/reddish drainage. Extremities: No significant cyanosis, clubbing, or edema bilateral lower  extremities Skin: Negative rashes, lesions, ulcers Psychiatric: Unable to evaluate Central nervous system: Withdraws to painful stimuli .     Data Reviewed: Care during the described time interval was provided by me .  I have reviewed this patient's available data, including medical history, events of note, physical examination, and all test results as part of my evaluation.   CBC: Recent Labs  Lab 01/24/19 1935 01/25/19 0305  WBC 11.1* 10.6*  NEUTROABS 10.0* 9.0*  HGB 12.8* 12.0*  HCT 36.6* 35.9*  MCV 89.9 89.8  PLT 146* 179*   Basic Metabolic Panel: Recent Labs  Lab 01/24/19 1935 01/25/19 0305  NA 133* 138  K 3.8 3.7  CL 99 103  CO2 22 23  GLUCOSE 255* 151*  BUN 16 15  CREATININE 1.11 1.19  CALCIUM 8.8* 8.5*   GFR: Estimated Creatinine Clearance: 47.9 mL/min (by C-G formula based on SCr of 1.19 mg/dL). Liver Function Tests: Recent Labs  Lab 01/24/19 1935 01/25/19 0305  AST 303* 252*  ALT 267* 263*  ALKPHOS 595* 549*  BILITOT 4.0* 4.9*  PROT 6.8 6.4*  ALBUMIN 2.7* 2.5*   Recent Labs  Lab 01/24/19 1935  LIPASE 51   Recent Labs  Lab 01/24/19 2317  AMMONIA 26   Coagulation Profile: Recent Labs  Lab 01/24/19 2218  INR 1.2   Cardiac Enzymes: Recent Labs  Lab 01/24/19 2154  TROPONINI 0.04*   BNP (last 3 results) No results for input(s): PROBNP in the last 8760 hours. HbA1C: No results for input(s): HGBA1C in the last 72 hours. CBG: Recent Labs  Lab 01/25/19 0023 01/25/19 0430  GLUCAP 185* 122*   Lipid Profile: No results for input(s): CHOL, HDL, LDLCALC, TRIG, CHOLHDL, LDLDIRECT in the last 72 hours. Thyroid Function Tests: Recent Labs    01/25/19 0305  TSH 1.043   Anemia Panel: No results for input(s): VITAMINB12, FOLATE, FERRITIN, TIBC, IRON, RETICCTPCT in the last 72 hours. Urine analysis:    Component Value Date/Time   COLORURINE AMBER (A) 01/24/2019 2138   APPEARANCEUR HAZY (A) 01/24/2019 2138   LABSPEC 1.018 01/24/2019  2138   PHURINE 5.0 01/24/2019 2138   GLUCOSEU 50 (A) 01/24/2019 2138   HGBUR SMALL (A) 01/24/2019 2138   BILIRUBINUR SMALL (A) 01/24/2019 2138   KETONESUR NEGATIVE 01/24/2019 2138   PROTEINUR NEGATIVE 01/24/2019 2138   NITRITE NEGATIVE 01/24/2019 2138   LEUKOCYTESUR LARGE (A) 01/24/2019 2138   Sepsis Labs: _0 (procalcitonin:4,lacticidven:4)  )No results found for this or any previous visit (from the past 240 hour(s)).       Radiology Studies:  Dg Chest Port 1 View  Result Date: 01/24/2019 CLINICAL DATA:  Tachypnea and possible sepsis EXAM: PORTABLE CHEST 1 VIEW COMPARISON:  12/12/2018 FINDINGS: Cardiac shadow is enlarged. Changes of prior TAVR are again seen. Aortic calcifications are noted. The lungs are well aerated bilaterally. Mild diffuse interstitial changes are noted similar to that seen on the prior exam. Patchy atelectasis/early infiltrate is noted in the left base. No bony abnormality is seen. IMPRESSION: Patchy left basilar atelectasis/early infiltrate. Electronically Signed   By: Inez Catalina M.D.   On: 01/24/2019 20:10   US Abdomen Limited Ruq  Result Date: 01/24/2019 CLINICAL DATA:  85 y/o  M; transaminitis. EXAM: ULTRASOUND ABDOMEN LIMITED RIGHT UPPER QUADRANT COMPARISON:  11/21/2018 CT abdomen and pelvis and abdominal ultrasound. 11/22/2018, 12/18/2018, 01/05/2019 interventional radiology cholecystostomy and cholecystostomy exchanges. FINDINGS: Gallbladder: Cholelithiasis with the largest stone measuring 9 mm near the gallbladder neck. Gallbladder wall thickening to 4.9 mm. Negative sonographic Murphy's sign. No pericholecystic fluid. Linear echogenic structure likely representing the cholecystostomy tube is partially visualized within gallbladder fossa, limited evaluation due to overlying bandaging. Common bile duct: Diameter: 7.3 mm Liver: No focal lesion identified. Within normal limits in parenchymal echogenicity. Portal vein is patent on color Doppler imaging with  normal direction of blood flow towards the liver. IMPRESSION: 1. Exam limited due to overlying bandaging in the right upper quadrant. 2. Mild gallbladder wall thickening, probably chronic sequelae of cholecystitis and cholecystostomy tube. Negative sonographic Murphy's sign. Cholelithiasis again noted. 3. No significant intra or extrahepatic biliary ductal dilatation identified. Electronically Signed   By: Kristine Garbe M.D.   On: 01/24/2019 22:25        Scheduled Meds:  amiodarone  100 mg Oral Daily   insulin aspart  0-9 Units Subcutaneous Q4H   pantoprazole (PROTONIX) IV  40 mg Intravenous Q24H   sodium chloride flush  3 mL Intravenous Q12H   sodium chloride flush  3 mL Intravenous Q12H   Continuous Infusions:  sodium chloride 10 mL/hr at 01/25/19 0007   ceFEPime (MAXIPIME) IV 2 g (01/25/19 0239)   metronidazole 500 mg (01/25/19 0555)   vancomycin       LOS: 1 day   The patient is critically ill with multiple organ systems failure and requires high complexity decision making for assessment and support, frequent evaluation and titration of therapies, application of advanced monitoring technologies and extensive interpretation of multiple databases. Critical Care Time devoted to patient care services described in this note  Time spent: 40 minutes     Shatia Sindoni, Geraldo Docker, MD [facility] Pager [phone]   If 7PM-7AM, please contact night-coverage [email or web address] Password [email or web address] 01/25/2019, 7:41 AM

## 2019-01-25 NOTE — Consult Note (Addendum)
CC: Consult by ED-P PA Donney Rankins for possible cholangitis  HPI: William [name] is an 85 y.o. male with hx of dementia, AAA, aortic stenosis status post TAVR, pulmonary nodules, chronic systolic CHF, history of VT with AICD that was removed due to infection, cholecystitis and cholangitis with biliary drain in place, debility at SNF - was admitted 11/2018 with severe sepsis managed in ICU, enterobacter bacteremia and cholecystitis - underwent perc chole drain placement that admission. He had followed with Dr. Redmond Pulling in our practice for possible interval cholecystectomy  I discussed much of hx over phone with his daughter, William [name], whom reports over the last week or two decreased output from drain. Became more altered in recent days and presented to ED today for evaluation. Found to have mild leukocytosis, elevated transaminases and tbili of 4. Lipase normal. We were consulted  RUQ US shows mild GBW thickening, likely chronic, negative Sonographic Murphy's; CBD 7.3 mm  Past Medical History:  Diagnosis Date   AAA (abdominal aortic aneurysm) ([place])    7/13 3.8cm   Arthritis    "joints" (01/11/2014)   Asthma    "seasonal; some foods"    Chronic systolic CHF (congestive heart failure) (HCC)    Dementia (HCC)    HLD (hyperlipidemia)    HOH (hard of hearing)    Lumbar vertebral fracture (HCC)    L1- 04/11/2014    Prostate cancer (HCC)    S/P ICD (internal cardiac defibrillator) procedure, 01/11/14 removal of ERI gen and placement of Medtronic Evera XT VR & NEW Right ventricular lead Medtronic 01/12/2014   S/P TAVR (transcatheter aortic valve replacement)    Edwards Sapien 3 THV (size 29 mm, model # B6411258, serial # [id number])   Severe aortic stenosis    Sleep apnea    "lost 60# & don't have it anymore" (01/11/2014)   Ventricular tachycardia [facility])     Past Surgical History:  Procedure Laterality Date   CARDIAC CATHETERIZATION  08/20/2004   noncritical CAD,mild global  hypokinesis, EF 50%   CARDIAC DEFIBRILLATOR PLACEMENT  08/23/2004   Medtronic   CATARACT EXTRACTION W/ INTRAOCULAR LENS IMPLANT Right    COLECTOMY  1990's   CRYOABLATION N/A 02/24/2014   Procedure: CRYO ABLATION PROSTATE;  Surgeon: Ailene Rud, MD;  Location: [facility] ORS;  Service: Urology;  Laterality: N/A;   HERNIA REPAIR     "abdomen; from colon OR"   ICD LEAD REMOVAL N/A 09/15/2018   Procedure: ICD LEAD REMOVAL EXTRACTION;  Surgeon: Evans Lance, MD;  [facility] OR;  Service: Cardiovascular;  Laterality: N/A;  DR. BARTLE TO BACK UP   IMPLANTABLE CARDIOVERTER DEFIBRILLATOR (ICD) GENERATOR CHANGE N/A 01/11/2014   Procedure: ICD GENERATOR CHANGE;  Surgeon: Sanda Klein, MD;  Location: [place] CATH LAB;  Service: Cardiovascular;  Laterality: N/A;   IR EXCHANGE BILIARY DRAIN  12/18/2018   IR EXCHANGE BILIARY DRAIN  01/05/2019   IR PERC CHOLECYSTOSTOMY  11/22/2018   LEAD REVISION N/A 01/11/2014   Procedure: LEAD REVISION;  Surgeon: Sanda Klein, MD;  Location: [place] CATH LAB;  Service: Cardiovascular;  Laterality: N/A;   NM MYOCAR PERF WALL MOTION  01/29/2012   abnormal c/o infarct/scar,no ischemia present   PROSTATE BIOPSY N/A 11/22/2013   Procedure: PROSTATE BIOPSY AND ULTRASOUND;  Surgeon: Ailene Rud, MD;  Location: [facility]S;  Service: Urology;  Laterality: N/A;   RIGHT/LEFT HEART CATH AND CORONARY ANGIOGRAPHY N/A 07/06/2018   Procedure: RIGHT/LEFT HEART CATH AND CORONARY ANGIOGRAPHY;  Surgeon: Troy Sine, MD;  Location: [facility]  INVASIVE CV LAB;  Service: Cardiovascular;  Laterality: N/A;   TEE WITHOUT CARDIOVERSION N/A 08/18/2018   Procedure: TRANSESOPHAGEAL ECHOCARDIOGRAM (TEE);  Surgeon: Burnell Blanks, MD;  Location: [place];  Service: Open Heart Surgery;  Laterality: N/A;   TEE WITHOUT CARDIOVERSION N/A 09/08/2018   Procedure: TRANSESOPHAGEAL ECHOCARDIOGRAM (TEE);  Surgeon: Lelon Perla, MD;  Location: [facility] ENDOSCOPY;  Service: Cardiovascular;  Laterality:  N/A;   TEE WITHOUT CARDIOVERSION N/A 09/15/2018   Procedure: TRANSESOPHAGEAL ECHOCARDIOGRAM (TEE);  Surgeon: Evans Lance, MD;  Location: [facility] OR;  Service: Cardiovascular;  Laterality: N/A;   TEE WITHOUT CARDIOVERSION N/A 11/26/2018   Procedure: TRANSESOPHAGEAL ECHOCARDIOGRAM (TEE);  Surgeon: Jerline Pain, MD;  Location: [facility] ENDOSCOPY;  Service: Cardiovascular;  Laterality: N/A;   TRANSCATHETER AORTIC VALVE REPLACEMENT, TRANSFEMORAL  08/18/2018   TRANSCATHETER AORTIC VALVE REPLACEMENT, TRANSFEMORAL N/A 08/18/2018   Procedure: TRANSCATHETER AORTIC VALVE REPLACEMENT, TRANSFEMORAL using an Edwards 36mm Aortic Valve;  Surgeon: Burnell Blanks, MD;  Location: [place];  Service: Open Heart Surgery;  Laterality: N/A;    Family History  Problem Relation Age of Onset   Heart failure Mother        Died of "old age" at 56   Pneumonia Father     Social:  reports that he has never smoked. He has never used smokeless tobacco. He reports current alcohol use. He reports that he does not use drugs.  Allergies:  Allergies  Allergen Reactions   Crestor [Rosuvastatin] Other (See Comments)    Hurts muscles   Lipitor [Atorvastatin] Other (See Comments)    Hurts stomach   Shrimp [Shellfish Allergy] Other (See Comments)    On MAR    Medications: I have reviewed the patient's current medications.  Results for orders placed or performed during the hospital encounter of 01/24/19 (from the past 48 hour(s))  Lactic acid, plasma     Status: Abnormal   Collection Time: 01/24/19  7:35 PM  Result Value Ref Range   Lactic Acid, Venous 2.7 (HH) 0.5 - 1.9 mmol/L    Comment: CRITICAL RESULT CALLED TO, READ BACK BY AND VERIFIED WITH: Lucendia Herrlich AT 2007 01/24/2019 BY L BENFIELD Performed at [facility], [place] [address], [place], [place] [address]   Comprehensive metabolic panel     Status: Abnormal   Collection Time: 01/24/19  7:35 PM  Result Value Ref Range   Sodium 133 (L) 135 - 145  mmol/L   Potassium 3.8 3.5 - 5.1 mmol/L   Chloride 99 98 - 111 mmol/L   CO2 22 22 - 32 mmol/L   Glucose, Bld 255 (H) 70 - 99 mg/dL   BUN 16 8 - 23 mg/dL   Creatinine, Ser 1.11 0.61 - 1.24 mg/dL   Calcium 8.8 (L) 8.9 - 10.3 mg/dL   Total Protein 6.8 6.5 - 8.1 g/dL   Albumin 2.7 (L) 3.5 - 5.0 g/dL   AST 303 (H) 15 - 41 U/L   ALT 267 (H) 0 - 44 U/L   Alkaline Phosphatase 595 (H) 38 - 126 U/L   Total Bilirubin 4.0 (H) 0.3 - 1.2 mg/dL   GFR calc non Af Amer 59 (L) >60 mL/min   GFR calc Af Amer >60 >60 mL/min   Anion gap 12 5 - 15    Comment: Performed at [facility], [place] [address] [place], [place] [address]  CBC WITH DIFFERENTIAL     Status: Abnormal   Collection Time: 01/24/19  7:35 PM  Result Value  Ref Range   WBC 11.1 (H) 4.0 - 10.5 K/uL   RBC 4.07 (L) 4.22 - 5.81 MIL/uL   Hemoglobin 12.8 (L) 13.0 - 17.0 g/dL   HCT 36.6 (L) 39.0 - 52.0 %   MCV 89.9 80.0 - 100.0 fL   MCH 31.4 26.0 - 34.0 pg   MCHC 35.0 30.0 - 36.0 g/dL   RDW 16.0 (H) 11.5 - 15.5 %   Platelets 146 (L) 150 - 400 K/uL   nRBC 0.0 0.0 - 0.2 %   Neutrophils Relative % 90 %   Neutro Abs 10.0 (H) 1.7 - 7.7 K/uL   Lymphocytes Relative 3 %   Lymphs Abs 0.3 (L) 0.7 - 4.0 K/uL   Monocytes Relative 6 %   Monocytes Absolute 0.7 0.1 - 1.0 K/uL   Eosinophils Relative 0 %   Eosinophils Absolute 0.0 0.0 - 0.5 K/uL   Basophils Relative 0 %   Basophils Absolute 0.0 0.0 - 0.1 K/uL   Immature Granulocytes 1 %   Abs Immature Granulocytes 0.05 0.00 - 0.07 K/uL    Comment: Performed at [facility], [address], [place], [place] [address]  Lipase, blood     Status: None   Collection Time: 01/24/19  7:35 PM  Result Value Ref Range   Lipase 51 11 - 51 U/L    Comment: Performed at [place] [address], [place], [place] [address]  Lactic acid, plasma     Status: Abnormal   Collection Time: 01/24/19  8:52 PM  Result Value Ref Range   Lactic Acid, Venous 2.6 (HH) 0.5 - 1.9 mmol/L    Comment:  CRITICAL RESULT CALLED TO, READ BACK BY AND VERIFIED WITH: Francene Finders 01/24/19 2123 WAYK Performed at [facility], [place] [address], [place], [place] [address]   Urinalysis, Routine w reflex microscopic     Status: Abnormal   Collection Time: 01/24/19  9:38 PM  Result Value Ref Range   Color, Urine AMBER (A) YELLOW    Comment: BIOCHEMICALS MAY BE AFFECTED BY COLOR   APPearance HAZY (A) CLEAR   Specific Gravity, Urine 1.018 1.005 - 1.030   pH 5.0 5.0 - 8.0   Glucose, UA 50 (A) NEGATIVE mg/dL   Hgb urine dipstick SMALL (A) NEGATIVE   Bilirubin Urine SMALL (A) NEGATIVE   Ketones, ur NEGATIVE NEGATIVE mg/dL   Protein, ur NEGATIVE NEGATIVE mg/dL   Nitrite NEGATIVE NEGATIVE   Leukocytes,Ua LARGE (A) NEGATIVE   RBC / HPF 6-10 0 - 5 RBC/hpf   WBC, UA >50 (H) 0 - 5 WBC/hpf   Bacteria, UA NONE SEEN NONE SEEN   Squamous Epithelial / LPF 0-5 0 - 5   Budding Yeast PRESENT     Comment: Performed at [facility], [address] [place], [place] [address]  Troponin I - ONCE - STAT     Status: Abnormal   Collection Time: 01/24/19  9:54 PM  Result Value Ref Range   Troponin I 0.04 (HH) <0.03 ng/mL    Comment: CRITICAL RESULT CALLED TO, READ BACK BY AND VERIFIED WITH: OSORIO B,RN 01/24/19 2259 WAYK Performed at [facility], [place] [address], [place], [place] [address]   APTT     Status: None   Collection Time: 01/24/19 10:18 PM  Result Value Ref Range   aPTT 31 24 - 36 seconds    Comment: Performed at [place] [address], [place], [place] [address]  Protime-INR     Status: None   Collection Time: 01/24/19 10:18 PM  Result Value Ref Range   Prothrombin Time 14.9 11.4 - 15.2 seconds   INR 1.2 0.8 - 1.2    Comment: (NOTE) INR goal varies based on device and disease states. Performed at [facility], [place] [address] [place], [place] [address]   Lactic acid, plasma     Status: Abnormal   Collection Time: 01/24/19 11:17 PM  Result Value Ref Range    Lactic Acid, Venous 3.1 (HH) 0.5 - 1.9 mmol/L    Comment: CRITICAL RESULT CALLED TO, READ BACK BY AND VERIFIED WITH: OSORIO B,RN 01/24/19 2341 WAYK Performed at [facility], [place] [address], [place], [place] [address]     Dg Chest Port 1 View  Result Date: 01/24/2019 CLINICAL DATA:  Tachypnea and possible sepsis EXAM: PORTABLE CHEST 1 VIEW COMPARISON:  12/12/2018 FINDINGS: Cardiac shadow is enlarged. Changes of prior TAVR are again seen. Aortic calcifications are noted. The lungs are well aerated bilaterally. Mild diffuse interstitial changes are noted similar to that seen on the prior exam. Patchy atelectasis/early infiltrate is noted in the left base. No bony abnormality is seen. IMPRESSION: Patchy left basilar atelectasis/early infiltrate. Electronically Signed   By: Inez Catalina M.D.   On: 01/24/2019 20:10   US Abdomen Limited Ruq  Result Date: 01/24/2019 CLINICAL DATA:  85 y/o  M; transaminitis. EXAM: ULTRASOUND ABDOMEN LIMITED RIGHT UPPER QUADRANT COMPARISON:  11/21/2018 CT abdomen and pelvis and abdominal ultrasound. 11/22/2018, 12/18/2018, 01/05/2019 interventional radiology cholecystostomy and cholecystostomy exchanges. FINDINGS: Gallbladder: Cholelithiasis with the largest stone measuring 9 mm near the gallbladder neck. Gallbladder wall thickening to 4.9 mm. Negative sonographic Murphy's sign. No pericholecystic fluid. Linear echogenic structure likely representing the cholecystostomy tube is partially visualized within gallbladder fossa, limited evaluation due to overlying bandaging. Common bile duct: Diameter: 7.3 mm Liver: No focal lesion identified. Within normal limits in parenchymal echogenicity. Portal vein is patent on color Doppler imaging with normal direction of blood flow towards the liver. IMPRESSION: 1. Exam limited due to overlying bandaging in the right upper quadrant. 2. Mild gallbladder wall thickening, probably chronic sequelae of cholecystitis and cholecystostomy  tube. Negative sonographic Murphy's sign. Cholelithiasis again noted. 3. No significant intra or extrahepatic biliary ductal dilatation identified. Electronically Signed   By: Kristine Garbe M.D.   On: 01/24/2019 22:25    ROS - all of the below systems have been reviewed with the patient and positives are indicated with bold text General: chills, fever or night sweats Eyes: blurry vision or double vision ENT: epistaxis or sore throat Allergy/Immunology: itchy/watery eyes or nasal congestion Hematologic/Lymphatic: bleeding problems, blood clots or swollen lymph nodes Endocrine: temperature intolerance or unexpected weight changes Breast: new or changing breast lumps or nipple discharge Resp: cough, shortness of breath, or wheezing CV: chest pain or dyspnea on exertion GI: as per HPI GU: dysuria, trouble voiding, or hematuria MSK: joint pain or joint stiffness Neuro: TIA or stroke symptoms Derm: pruritus and skin lesion changes Psych: anxiety and depression  PE Blood pressure 108/74, pulse (!) 105, temperature 99.3 F (37.4 C), temperature source Rectal, resp. rate 18, height 5\' 10"  (1.778 m), weight 92 kg, SpO2 97 %. Constitutional: NAD; somnolent; no deformities Eyes: Moist conjunctiva; no lid lag; mildly icteric sclera; PERRL Neck: Trachea midline; no thyromegaly Lungs: Normal respiratory effort; no tactile fremitus CV: RRR; no palpable thrills; no pitting edema GI: Abd soft, not significantly ttp in RUQ; no  palpable hepatosplenomegaly. Drain in place - tiny stones and brown debris in bag MSK: no clubbing/cyanosis Psychiatric: Appropriate affect; alert and oriented x3 Lymphatic: No palpable cervical or axillary lymphadenopathy  Results for orders placed or performed during the hospital encounter of 01/24/19 (from the past 48 hour(s))  Lactic acid, plasma     Status: Abnormal   Collection Time: 01/24/19  7:35 PM  Result Value Ref Range   Lactic Acid, Venous 2.7 (HH) 0.5  - 1.9 mmol/L    Comment: CRITICAL RESULT CALLED TO, READ BACK BY AND VERIFIED WITH: Lucendia Herrlich AT 2007 01/24/2019 BY L BENFIELD Performed at [facility], [address] [place], [place] [address]   Comprehensive metabolic panel     Status: Abnormal   Collection Time: 01/24/19  7:35 PM  Result Value Ref Range   Sodium 133 (L) 135 - 145 mmol/L   Potassium 3.8 3.5 - 5.1 mmol/L   Chloride 99 98 - 111 mmol/L   CO2 22 22 - 32 mmol/L   Glucose, Bld 255 (H) 70 - 99 mg/dL   BUN 16 8 - 23 mg/dL   Creatinine, Ser 1.11 0.61 - 1.24 mg/dL   Calcium 8.8 (L) 8.9 - 10.3 mg/dL   Total Protein 6.8 6.5 - 8.1 g/dL   Albumin 2.7 (L) 3.5 - 5.0 g/dL   AST 303 (H) 15 - 41 U/L   ALT 267 (H) 0 - 44 U/L   Alkaline Phosphatase 595 (H) 38 - 126 U/L   Total Bilirubin 4.0 (H) 0.3 - 1.2 mg/dL   GFR calc non Af Amer 59 (L) >60 mL/min   GFR calc Af Amer >60 >60 mL/min   Anion gap 12 5 - 15    Comment: Performed at [facility], [place] [address] [place], [place] [address]  CBC WITH DIFFERENTIAL     Status: Abnormal   Collection Time: 01/24/19  7:35 PM  Result Value Ref Range   WBC 11.1 (H) 4.0 - 10.5 K/uL   RBC 4.07 (L) 4.22 - 5.81 MIL/uL   Hemoglobin 12.8 (L) 13.0 - 17.0 g/dL   HCT 36.6 (L) 39.0 - 52.0 %   MCV 89.9 80.0 - 100.0 fL   MCH 31.4 26.0 - 34.0 pg   MCHC 35.0 30.0 - 36.0 g/dL   RDW 16.0 (H) 11.5 - 15.5 %   Platelets 146 (L) 150 - 400 K/uL   nRBC 0.0 0.0 - 0.2 %   Neutrophils Relative % 90 %   Neutro Abs 10.0 (H) 1.7 - 7.7 K/uL   Lymphocytes Relative 3 %   Lymphs Abs 0.3 (L) 0.7 - 4.0 K/uL   Monocytes Relative 6 %   Monocytes Absolute 0.7 0.1 - 1.0 K/uL   Eosinophils Relative 0 %   Eosinophils Absolute 0.0 0.0 - 0.5 K/uL   Basophils Relative 0 %   Basophils Absolute 0.0 0.0 - 0.1 K/uL   Immature Granulocytes 1 %   Abs Immature Granulocytes 0.05 0.00 - 0.07 K/uL    Comment: Performed at [facility], [place] [address], [place], [place] [address]  Lipase, blood     Status: None    Collection Time: 01/24/19  7:35 PM  Result Value Ref Range   Lipase 51 11 - 51 U/L    Comment: Performed at [place] [address] [place], [place] [address]  Lactic acid, plasma     Status: Abnormal   Collection Time: 01/24/19  8:52 PM  Result  Value Ref Range   Lactic Acid, Venous 2.6 (HH) 0.5 - 1.9 mmol/L    Comment: CRITICAL RESULT CALLED TO, READ BACK BY AND VERIFIED WITH: Francene Finders 01/24/19 2123 WAYK Performed at [facility], [place] [address], [place], [place] [address]   Urinalysis, Routine w reflex microscopic     Status: Abnormal   Collection Time: 01/24/19  9:38 PM  Result Value Ref Range   Color, Urine AMBER (A) YELLOW    Comment: BIOCHEMICALS MAY BE AFFECTED BY COLOR   APPearance HAZY (A) CLEAR   Specific Gravity, Urine 1.018 1.005 - 1.030   pH 5.0 5.0 - 8.0   Glucose, UA 50 (A) NEGATIVE mg/dL   Hgb urine dipstick SMALL (A) NEGATIVE   Bilirubin Urine SMALL (A) NEGATIVE   Ketones, ur NEGATIVE NEGATIVE mg/dL   Protein, ur NEGATIVE NEGATIVE mg/dL   Nitrite NEGATIVE NEGATIVE   Leukocytes,Ua LARGE (A) NEGATIVE   RBC / HPF 6-10 0 - 5 RBC/hpf   WBC, UA >50 (H) 0 - 5 WBC/hpf   Bacteria, UA NONE SEEN NONE SEEN   Squamous Epithelial / LPF 0-5 0 - 5   Budding Yeast PRESENT     Comment: Performed at [facility], [address] [place], [place] [address]  Troponin I - ONCE - STAT     Status: Abnormal   Collection Time: 01/24/19  9:54 PM  Result Value Ref Range   Troponin I 0.04 (HH) <0.03 ng/mL    Comment: CRITICAL RESULT CALLED TO, READ BACK BY AND VERIFIED WITH: OSORIO B,RN 01/24/19 2259 WAYK Performed at [facility], [place] [address], [place], [place] [address]   APTT     Status: None   Collection Time: 01/24/19 10:18 PM  Result Value Ref Range   aPTT 31 24 - 36 seconds    Comment: Performed at [facility] [address], [place], [place] [address]  Protime-INR     Status: None   Collection Time: 01/24/19 10:18 PM  Result  Value Ref Range   Prothrombin Time 14.9 11.4 - 15.2 seconds   INR 1.2 0.8 - 1.2    Comment: (NOTE) INR goal varies based on device and disease states. Performed at [facility], [place] [address], [place], [place] [address]   Lactic acid, plasma     Status: Abnormal   Collection Time: 01/24/19 11:17 PM  Result Value Ref Range   Lactic Acid, Venous 3.1 (HH) 0.5 - 1.9 mmol/L    Comment: CRITICAL RESULT CALLED TO, READ BACK BY AND VERIFIED WITH: OSORIO B,RN 01/24/19 2341 WAYK Performed at [facility], [place] [address], [place], [place] [address]     Dg Chest Port 1 View  Result Date: 01/24/2019 CLINICAL DATA:  Tachypnea and possible sepsis EXAM: PORTABLE CHEST 1 VIEW COMPARISON:  12/12/2018 FINDINGS: Cardiac shadow is enlarged. Changes of prior TAVR are again seen. Aortic calcifications are noted. The lungs are well aerated bilaterally. Mild diffuse interstitial changes are noted similar to that seen on the prior exam. Patchy atelectasis/early infiltrate is noted in the left base. No bony abnormality is seen. IMPRESSION: Patchy left basilar atelectasis/early infiltrate. Electronically Signed   By: Inez Catalina M.D.   On: 01/24/2019 20:10   US Abdomen Limited Ruq  Result Date: 01/24/2019 CLINICAL DATA:  85 y/o  M; transaminitis. EXAM: ULTRASOUND ABDOMEN LIMITED RIGHT UPPER QUADRANT COMPARISON:  11/21/2018 CT abdomen and pelvis and abdominal ultrasound. 11/22/2018, 12/18/2018, 01/05/2019 interventional radiology  cholecystostomy and cholecystostomy exchanges. FINDINGS: Gallbladder: Cholelithiasis with the largest stone measuring 9 mm near the gallbladder neck. Gallbladder wall thickening to 4.9 mm. Negative sonographic Murphy's sign. No pericholecystic fluid. Linear echogenic structure likely representing the cholecystostomy tube is partially visualized within gallbladder fossa, limited evaluation due to overlying bandaging. Common bile duct: Diameter: 7.3 mm Liver: No focal lesion  identified. Within normal limits in parenchymal echogenicity. Portal vein is patent on color Doppler imaging with normal direction of blood flow towards the liver. IMPRESSION: 1. Exam limited due to overlying bandaging in the right upper quadrant. 2. Mild gallbladder wall thickening, probably chronic sequelae of cholecystitis and cholecystostomy tube. Negative sonographic Murphy's sign. Cholelithiasis again noted. 3. No significant intra or extrahepatic biliary ductal dilatation identified. Electronically Signed   By: Kristine Garbe M.D.   On: 01/24/2019 22:25   A/P: William [name] is an 85 y.o. male with dementia, AAA, aortic stenosis status post TAVR, pulmonary nodules, chronic systolic CHF, history of VT with AICD that was removed due to infection, cholecystitis and cholangitis with biliary drain in place, debility at SNF - here with possible cholangitis  -Medicine has admitted him -Recommend broad spec IV abx; fluid resuscitation -Recommend GI consultation which is now pending given possible choledocholithiasis -Recommend IR consultation for drain study to ensure patent/not displaced -Overall appears to be poor surgical candidate - can have cardiology risk stratify given significant hx which may help guide family in decision making process.  Sharon Mt. Dema Severin, M.D. [facility]

## 2019-01-25 NOTE — Consult Note (Addendum)
Consultation  Referring Provider: [facility] hospitalist /Opyd MD primary Care Physician:  Gayland Curry, DO Primary Gastroenterologist: None  Reason for Consultation: Biliary sepsis  HPI: William [name] is a 85 y.o. male admitted through the emergency room late last night from skilled nursing facility with progressive lethargy nausea vomiting and fever.  On arrival in the ER he was noted to be febrile to 100.7 and tachycardic with pulse of 130.  Patient has recent history of admission in February 2020 with acute cholecystitis.  Due to advanced age and multiple comorbidities he was not felt to be a surgical candidate and underwent cholecystostomy.  He was discharged back to the nursing home with drainage tube in place.  At the time of that admission liver tests were normal, he did have an Enterobacter bacteremia and was critically ill and required short-term intubation. Thus far he has been hemodynamically stable.  Ultrasound last evening showed mild gallbladder wall thickening and a CBD of 7.3 mm. Apparently family had noted nausea and decreased intake over the past few days and also states he has had less drainage from the biliary drain over the past few days. Other medical issues include adult onset diabetes mellitus, antiplatelet therapy-on Plavix, heart myopathy with EF of 25 to 30%, chronic congestive heart failure, prior history of prostate cancer, is status post tissue aortic valve replacement and prior colectomy.  He also has a significant dementia.  Today's labs reviewed-T bili 4.9 alk phos 549 ALT 263 AST of 252 WBC 10.6, hemoglobin 12, platelets 132 lactic acid elevated at 2.7 yesterday and has normalized this a.m. He is being covered with vancomycin, Flagyl and Maxipime Surgery has consulted and recommended tube study -scheduled for this morning and GI consultation for possible ERCP.  Patient remains lethargic and is unable to offer any history this morning  Past Medical  History:  Diagnosis Date   AAA (abdominal aortic aneurysm) ([place])    7/13 3.8cm   Arthritis    "joints" (01/11/2014)   Asthma    "seasonal; some foods"    Chronic systolic CHF (congestive heart failure) (HCC)    Dementia (HCC)    HLD (hyperlipidemia)    HOH (hard of hearing)    Lumbar vertebral fracture (HCC)    L1- 04/11/2014    Prostate cancer (HCC)    S/P ICD (internal cardiac defibrillator) procedure, 01/11/14 removal of ERI gen and placement of Medtronic Evera XT VR & NEW Right ventricular lead Medtronic 01/12/2014   S/P TAVR (transcatheter aortic valve replacement)    Edwards Sapien 3 THV (size 29 mm, model # [id number], serial # [id number])   Severe aortic stenosis    Sleep apnea    "lost 60# & don't have it anymore" (01/11/2014)   Ventricular tachycardia [facility])     Past Surgical History:  Procedure Laterality Date   CARDIAC CATHETERIZATION  08/20/2004   noncritical CAD,mild global hypokinesis, EF 50%   CARDIAC DEFIBRILLATOR PLACEMENT  08/23/2004   Medtronic   CATARACT EXTRACTION W/ INTRAOCULAR LENS IMPLANT Right    COLECTOMY  1990's   CRYOABLATION N/A 02/24/2014   Procedure: CRYO ABLATION PROSTATE;  Surgeon: Ailene Rud, MD;  Location: [facility] ORS;  Service: Urology;  Laterality: N/A;   HERNIA REPAIR     "abdomen; from colon OR"   ICD LEAD REMOVAL N/A 09/15/2018   Procedure: ICD LEAD REMOVAL EXTRACTION;  Surgeon: Evans Lance, MD;  Location: [facility] OR;  Service: Cardiovascular;  Laterality: N/A;  DR. BARTLE TO BACK  UP   IMPLANTABLE CARDIOVERTER DEFIBRILLATOR (ICD) GENERATOR CHANGE N/A 01/11/2014   Procedure: ICD GENERATOR CHANGE;  Surgeon: Sanda Klein, MD;  Location: [place] CATH LAB;  Service: Cardiovascular;  Laterality: N/A;   IR EXCHANGE BILIARY DRAIN  12/18/2018   IR EXCHANGE BILIARY DRAIN  01/05/2019   IR PERC CHOLECYSTOSTOMY  11/22/2018   LEAD REVISION N/A 01/11/2014   Procedure: LEAD REVISION;  Surgeon: Sanda Klein, MD;  Location: [place] CATH LAB;   Service: Cardiovascular;  Laterality: N/A;   NM MYOCAR PERF WALL MOTION  01/29/2012   abnormal c/o infarct/scar,no ischemia present   PROSTATE BIOPSY N/A 11/22/2013   Procedure: PROSTATE BIOPSY AND ULTRASOUND;  Surgeon: Ailene Rud, MD;  Location: [facility] ORS;  Service: Urology;  Laterality: N/A;   RIGHT/LEFT HEART CATH AND CORONARY ANGIOGRAPHY N/A 07/06/2018   Procedure: RIGHT/LEFT HEART CATH AND CORONARY ANGIOGRAPHY;  Surgeon: Troy Sine, MD;  Location: [place] CV LAB;  Service: Cardiovascular;  Laterality: N/A;   TEE WITHOUT CARDIOVERSION N/A 08/18/2018   Procedure: TRANSESOPHAGEAL ECHOCARDIOGRAM (TEE);  Surgeon: Burnell Blanks, MD;  Location: [place];  Service: Open Heart Surgery;  Laterality: N/A;   TEE WITHOUT CARDIOVERSION N/A 09/08/2018   Procedure: TRANSESOPHAGEAL ECHOCARDIOGRAM (TEE);  Surgeon: Lelon Perla, MD;  Location: [facility] ENDOSCOPY;  Service: Cardiovascular;  Laterality: N/A;   TEE WITHOUT CARDIOVERSION N/A 09/15/2018   Procedure: TRANSESOPHAGEAL ECHOCARDIOGRAM (TEE);  Surgeon: Evans Lance, MD;  Location: [facility] OR;  Service: Cardiovascular;  Laterality: N/A;   TEE WITHOUT CARDIOVERSION N/A 11/26/2018   Procedure: TRANSESOPHAGEAL ECHOCARDIOGRAM (TEE);  Surgeon: Jerline Pain, MD;  Location: [facility] ENDOSCOPY;  Service: Cardiovascular;  Laterality: N/A;   TRANSCATHETER AORTIC VALVE REPLACEMENT, TRANSFEMORAL  08/18/2018   TRANSCATHETER AORTIC VALVE REPLACEMENT, TRANSFEMORAL N/A 08/18/2018   Procedure: TRANSCATHETER AORTIC VALVE REPLACEMENT, TRANSFEMORAL using an Edwards 65m Aortic Valve;  Surgeon: MBurnell Blanks MD;  [place]  Service: Open Heart Surgery;  Laterality: N/A;    Prior to Admission medications   Medication Sig Start Date End Date Taking? Authorizing Provider  acetaminophen (TYLENOL) 500 MG tablet Take 500 mg by mouth every 6 (six) hours as needed (for pain).    Yes [provider]  amiodarone (PACERONE) 200 MG tablet  Take 0.5 tablets (100 mg total) by mouth daily. 11/16/18  Yes Croitoru, Mihai, MD  aspirin 81 MG chewable tablet Chew 1 tablet (81 mg total) by mouth daily. 07/08/18  Yes Kroeger, KDaleen SnookM., PA-C  bisacodyl (DULCOLAX) 10 MG suppository Place 1 suppository (10 mg total) rectally daily as needed for moderate constipation. 11/27/18  Yes Ghimire, SHenreitta Leber MD  cholecalciferol (VITAMIN D) 1000 units tablet Take 2,000 Units by mouth at bedtime.   Yes [provider]  clopidogrel (PLAVIX) 75 MG tablet Take 1 tablet (75 mg total) by mouth daily with breakfast. 08/21/18  Yes TEileen Stanford PA-C  metoprolol succinate (TOPROL-XL) 25 MG 24 hr tablet Take 1 tablet (25 mg total) by mouth daily. Take with or immediately following a meal. 09/18/18  Yes SNita Sells MD  nystatin-triamcinolone (MYCOLOG II) cream Apply 1 application topically See admin instructions. Apply to right and left buttocks twice daily for 7 days - stop date 01/25/19   Yes [provider]  polyethylene glycol (MIRALAX / GLYCOLAX) packet Take 17 g by mouth every evening. 01/21/17  Yes Croitoru, Mihai, MD  promethazine (PHENERGAN) 25 MG/ML injection Inject 12.5 mg into the muscle every 6 (six) hours as needed for nausea or vomiting.  Yes [provider]  triamcinolone (KENALOG) 0.025 % cream Apply 1 application topically 2 (two) times daily.    Yes [provider]    Current Facility-Administered Medications  Medication Dose Route Frequency Provider Last Rate Last Dose   0.9 %  sodium chloride infusion  250 mL Intravenous PRN Opyd, Ilene Qua, MD 10 mL/hr at 01/25/19 0007     acetaminophen (TYLENOL) tablet 650 mg  650 mg Oral Q6H PRN Opyd, Ilene Qua, MD       Or   acetaminophen (TYLENOL) suppository 650 mg  650 mg Rectal Q6H PRN Opyd, Ilene Qua, MD       amiodarone (PACERONE) tablet 100 mg  100 mg Oral Daily Opyd, Ilene Qua, MD       ceFEPIme (MAXIPIME) 2 g in sodium chloride 0.9 % 100 mL IVPB   2 g Intravenous Q12H Rumbarger, Rachel L, RPH 200 mL/hr at 01/25/19 0239 2 g at 01/25/19 0239   insulin aspart (novoLOG) injection 0-9 Units  0-9 Units Subcutaneous Q4H Opyd, Ilene Qua, MD   1 Units at 01/25/19 7681   metroNIDAZOLE (FLAGYL) IVPB 500 mg  500 mg Intravenous Q8H Opyd, Ilene Qua, MD 100 mL/hr at 01/25/19 0555 500 mg at 01/25/19 0555   ondansetron (ZOFRAN) tablet 4 mg  4 mg Oral Q6H PRN Opyd, Ilene Qua, MD       Or   ondansetron (ZOFRAN) injection 4 mg  4 mg Intravenous Q6H PRN Opyd, Ilene Qua, MD       pantoprazole (PROTONIX) injection 40 mg  40 mg Intravenous Q24H Opyd, Ilene Qua, MD   40 mg at 01/25/19 0024   sodium chloride flush (NS) 0.9 % injection 3 mL  3 mL Intravenous Q12H Opyd, Ilene Qua, MD   3 mL at 01/25/19 0028   sodium chloride flush (NS) 0.9 % injection 3 mL  3 mL Intravenous Q12H Opyd, Ilene Qua, MD   3 mL at 01/25/19 0013   sodium chloride flush (NS) 0.9 % injection 3 mL  3 mL Intravenous PRN Opyd, Ilene Qua, MD   3 mL at 01/25/19 0013   vancomycin (VANCOCIN) 1,500 mg in sodium chloride 0.9 % 500 mL IVPB  1,500 mg Intravenous Q24H Opyd, Ilene Qua, MD        Allergies as of 01/24/2019 - Review Complete 01/24/2019  Allergen Reaction Noted   Crestor [rosuvastatin] Other (See Comments) 05/21/2013   Lipitor [atorvastatin] Other (See Comments) 05/21/2013   Shrimp [shellfish allergy] Other (See Comments) 10/10/2018    Family History  Problem Relation Age of Onset   Heart failure Mother        Died of "old age" at 64   Pneumonia Father     Social History   Socioeconomic History   Marital status: Married    Spouse name: Not on file   Number of children: 2   Years of education: Not on file   Highest education level: Not on file  Occupational History   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"  Social Needs   Emergency planning/management officer strain: Not on file   Food insecurity:    Worry: Not on file    Inability: Not on file   Transportation  needs:    Medical: Not on file    Non-medical: Not on file  Tobacco Use   Smoking status: Never Smoker   Smokeless tobacco: Never Used  Substance and Sexual Activity   Alcohol use: Yes    Alcohol/week: 0.0 standard drinks  Comment: 01/11/2014 "drink 1/2 of a beer twice per month    Drug use: No   Sexual activity: Not Currently  Lifestyle   Physical activity:    Days per week: Not on file    Minutes per session: Not on file   Stress: Not on file  Relationships   Social connections:    Talks on phone: Not on file    Gets together: Not on file    Attends religious service: Not on file    Active member of club or organization: Not on file    Attends meetings of clubs or organizations: Not on file    Relationship status: Not on file   Intimate partner violence:    Fear of current or ex partner: Not on file    Emotionally abused: Not on file    Physically abused: Not on file    Forced sexual activity: Not on file  Other Topics Concern   Not on file  Social History Narrative   Not on file    Review of Systems: Pertinent positive and negative review of systems were noted in the above HPI section.  All other review of systems was otherwise negative.  Physical Exam: Vital signs in last 24 hours: Temp:  [93.4 F (34.1 C)-100.8 F (38.2 C)] 93.4 F (34.1 C) (04/13 0542) Pulse Rate:  [98-126] 98 (04/13 0157) Resp:  [18-24] 18 (04/13 0242) BP: (83-110)/(55-75) 102/67 (04/13 0400) SpO2:  [96 %-99 %] 96 % (04/13 0242) Weight:  [91.6 kg-92 kg] 91.6 kg (04/13 0131)   General: Arousable but falls back asleep well-developed, well-nourished, elderly white male, jaundiced Head:  Normocephalic and atraumatic. Eyes:  Sclera icteric conjunctiva pink. Ears:  Normal auditory acuity. Nose:  No deformity, discharge,  or lesions. Mouth:  No deformity or lesions.   Neck:  Supple; no masses or thyromegaly. Lungs:  Clear throughout to auscultation.   No wheezes, crackles, or  rhonchi. Heart:  Regular rate and rhythm; no murmurs, clicks, rubs,  or gallops. Abdomen:  Soft,nontender, BS active,nonpalp mass or hsm.  Cholecystostomy drain in place with cloudy dark green effluent Rectal:  Deferred  Msk:  Symmetrical without gross deformities. . Pulses:  Normal pulses noted. Extremities:  Without clubbing or edema. Neurologic: Lethargic, but arousable Skin: Early jaundice.Marland Kitchen Psych:  Alert and cooperative. Normal mood and affect.  Intake/Output from previous day: 04/12 0701 - 04/13 0700 In: 450 [IV Piggyback:450] Out: -  Intake/Output this shift: No intake/output data recorded.  Lab Results: Recent Labs    01/24/19 1935 01/25/19 0305  WBC 11.1* 10.6*  HGB 12.8* 12.0*  HCT 36.6* 35.9*  PLT 146* 132*   BMET Recent Labs    01/24/19 1935 01/25/19 0305  NA 133* 138  K 3.8 3.7  CL 99 103  CO2 22 23  GLUCOSE 255* 151*  BUN 16 15  CREATININE 1.11 1.19  CALCIUM 8.8* 8.5*   LFT Recent Labs    01/25/19 0305  PROT 6.4*  ALBUMIN 2.5*  AST 252*  ALT 263*  ALKPHOS 549*  BILITOT 4.9*   PT/INR Recent Labs    01/24/19 2218  LABPROT 14.9  INR 1.2   Hepatitis Panel No results for input(s): HEPBSAG, HCVAB, HEPAIGM, HEPBIGM in the last 72 hours.    IMPRESSION:  #62  85 year old white male admitted with lethargy, hypotension, fever nausea and vomiting.  Patient with history of recent admission with acute cholecystitis and sepsis.  He had an Enterobacter bacteremia and underwent cholecystostomy/drainage.  Patient had  had decreased output from the biliary drain over the past few days and presented with jaundice and significantly elevated LFTs.  Certainly may have choledocholithiasis, rule out compression of CBD by gallbladder(mirizzii's syndrome) rule out intrahepatic chole stasis secondary to sepsis  #2 antiplatelet therapy-on Plavix - on hold #3 dementia #4 cardiomyopathy with EF 25 to 30%/coronary artery disease #5 chronic CHF #6.  Status post  aortic valve replacement #7.  Adult onset diabetes mellitus #8.  History prostate CA  Plan; Discussed with Dr. Lyndel Safe, requested IR consultation-for tube study to include dilation of the CBD , and stent/internal/external biliary drainage if indicated.  ERCP can be considered if found to have choledocholithiasis and IR unable to achieve drainage.  Patient is very high risk for complications with anesthesia.     Amy Esterwood PA-C 01/25/2019, 9:47 AM   Attending physician's note   I have  reviewed the chart and examined the patient. History not obtainable due to dementia.  I agree with the Advanced Practitioner's note, impression and recommendations.    85yrold with advanced dementia, CHF (EF 25-30%), AS s/p TAVR, H/O VT s/p AICD (removed d/t infection), pulm nodules, AAA, sleep apnea who underwent cholecystostomy d/t acute cholecystitis/sepsis Feb 2020 d/t being a poor surgical candidate.(GB drain last changed 3/24) Now admitted with biliary sepsis, abn LFTs with jaundice (TB 4.9), decreased cholecystostomy tube output (likely d/t dislodged tube). Has significant cholelithiasis. UKoreawithout biliary ductal dilatation. Abn LFTs/jaundice could be d/t sepsis. R/O CBD stones.  Doubt Mirizzi syndrome since there is no ductal dilatation. Surgery on board. Not a candidate for surgery. Plavix on hold.  Plan: -Change cholecystostomy tube and obtain cholangiogram today. If cholangiogram is positive for obstruction/CBD stones, proceed with PTC. If unable to obtain cholangiogram and pt fails to improve in next 24-48hrs, consider MRCP (not sure it pt could be still for some time). -Patient is a very high risk for ERCP/general anesthesia and will have significant mortality with the procedure. Hence, would like to avoid it. -Agree with broad-spectrum antibiotics. -Trend CBC/LFTs. -Discussed with IR (Dr HAnselm Pancoast.  RCarmell Austria MD

## 2019-01-25 NOTE — Telephone Encounter (Signed)
Called daughter to let her know someone will check in on him and she is very happy with that news.

## 2019-01-25 NOTE — Telephone Encounter (Signed)
I will check in on him in person tomorrow. I am very sad to see he is sick again though I am happy he has made it this far. Looks like a new problem for him

## 2019-01-25 NOTE — Procedures (Signed)
Interventional Radiology Procedure:   Indications: Admitted for cholecystitis and sepsis with existing cholecystostomy tube.  Procedure: Drain injection and drain exchange  Findings: Drain positioned in gallbladder but drain was partially obstructed.  Placed new 12 Fr drain and removed greater than 40 ml of light brown purulent looking fluid from gallbladder.  Cystic duct remains obstructed and cannot evaluate CBD.    Complications: None     EBL: none  Plan: Send fluid for culture. Continue gallbladder drainage.     William [name] R. Anselm Pancoast, MD  Pager: [phone]

## 2019-01-25 NOTE — Progress Notes (Signed)
[facility]/Trauma Progress Note      Assessment/Plan Dementia AAA aortic stenosis status post TAVR pulmonary nodules chronic systolic CHF history of VT withAICD that was removed due to infection cholecystitis and cholangitis with biliary drain in place 11/2018  Possible cholangitis - continue broad spec IV abx; fluid resuscitation - Recommend GI consultation for possible choledocholithiasis - Recommend IR consultation for drain study to ensure patent/not displaced - Overall appears to be poor surgical candidate - can have cardiology risk stratify given significant hx which may help guide family in decision making process - we will follow  FEN: NPO VTE: SCD's ID: Vancomycin, Flagyl, Maxipime 04/12>> Follow up: TBD    LOS: 1 day    Subjective: CC: no complaints  Pt denies abdominal pain. He is confused.   Objective: Vital signs in last 24 hours: Temp:  [93.4 F (34.1 C)-100.8 F (38.2 C)] 93.4 F (34.1 C) (04/13 0542) Pulse Rate:  [98-126] 98 (04/13 0157) Resp:  [18-24] 18 (04/13 0242) BP: (83-110)/(55-75) 102/67 (04/13 0400) SpO2:  [96 %-99 %] 96 % (04/13 0242) Weight:  [91.6 kg-92 kg] 91.6 kg (04/13 0131)    Intake/Output from previous day: 04/12 0701 - 04/13 0700 In: 450 [IV Piggyback:450] Out: -  Intake/Output this shift: No intake/output data recorded.  PE: Gen:  Alert, NAD, elderly male, sleeping Pulm:  Rate and effort normal Abd: Soft, NT/ND, RUQ drain in place with scant brown/green drainage in bag. Abdominal binder in place Skin: warm and dry   Anti-infectives: Anti-infectives (From admission, onward)   Start     Dose/Rate Route Frequency Ordered Stop   01/25/19 2200  vancomycin (VANCOCIN) 1,500 mg in sodium chloride 0.9 % 500 mL IVPB     1,500 mg 250 mL/hr over 120 Minutes Intravenous Every 24 hours 01/24/19 2027     01/25/19 0200  ceFEPIme (MAXIPIME) 2 g in sodium chloride 0.9 % 100 mL IVPB     2 g 200 mL/hr over 30 Minutes  Intravenous Every 12 hours 01/24/19 2211     01/24/19 2200  ceFEPIme (MAXIPIME) 2 g in sodium chloride 0.9 % 100 mL IVPB  Status:  Discontinued     2 g 200 mL/hr over 30 Minutes Intravenous  Once 01/24/19 2158 01/24/19 2208   01/24/19 2200  metroNIDAZOLE (FLAGYL) IVPB 500 mg     500 mg 100 mL/hr over 60 Minutes Intravenous Every 8 hours 01/24/19 2158     01/24/19 1945  piperacillin-tazobactam (ZOSYN) IVPB 3.375 g     3.375 g 100 mL/hr over 30 Minutes Intravenous  Once 01/24/19 1931 01/24/19 2017   01/24/19 1930  ceFEPIme (MAXIPIME) 2 g in sodium chloride 0.9 % 100 mL IVPB  Status:  Discontinued     2 g 200 mL/hr over 30 Minutes Intravenous  Once 01/24/19 1918 01/24/19 2010   01/24/19 1930  metroNIDAZOLE (FLAGYL) IVPB 500 mg  Status:  Discontinued     500 mg 100 mL/hr over 60 Minutes Intravenous  Once 01/24/19 1918 01/24/19 1931   01/24/19 1930  vancomycin (VANCOCIN) IVPB 1000 mg/200 mL premix  Status:  Discontinued     1,000 mg 200 mL/hr over 60 Minutes Intravenous  Once 01/24/19 1918 01/24/19 1922   01/24/19 1930  vancomycin (VANCOCIN) 2,000 mg in sodium chloride 0.9 % 500 mL IVPB     2,000 mg 250 mL/hr over 120 Minutes Intravenous  Once 01/24/19 1922 01/25/19 0012      Lab Results:  Recent Labs    01/24/19 1935  01/25/19 0305  WBC 11.1* 10.6*  HGB 12.8* 12.0*  HCT 36.6* 35.9*  PLT 146* 132*   BMET Recent Labs    01/24/19 1935 01/25/19 0305  NA 133* 138  K 3.8 3.7  CL 99 103  CO2 22 23  GLUCOSE 255* 151*  BUN 16 15  CREATININE 1.11 1.19  CALCIUM 8.8* 8.5*   PT/INR Recent Labs    01/24/19 2218  LABPROT 14.9  INR 1.2   CMP     Component Value Date/Time   NA 138 01/25/2019 0305   NA 132 (A) 01/10/2019   K 3.7 01/25/2019 0305   CL 103 01/25/2019 0305   CO2 23 01/25/2019 0305   GLUCOSE 151 (H) 01/25/2019 0305   BUN 15 01/25/2019 0305   BUN 26 (A) 01/10/2019   CREATININE 1.19 01/25/2019 0305   CREATININE 1.12 (H) 02/05/2017 1103   CALCIUM 8.5 (L)  01/25/2019 0305   PROT 6.4 (L) 01/25/2019 0305   ALBUMIN 2.5 (L) 01/25/2019 0305   AST 252 (H) 01/25/2019 0305   ALT 263 (H) 01/25/2019 0305   ALKPHOS 549 (H) 01/25/2019 0305   BILITOT 4.9 (H) 01/25/2019 0305   GFRNONAA 54 (L) 01/25/2019 0305   GFRAA >60 01/25/2019 0305   Lipase     Component Value Date/Time   LIPASE 51 01/24/2019 1935    Studies/Results: Dg Chest Port 1 View  Result Date: 01/24/2019 CLINICAL DATA:  Tachypnea and possible sepsis EXAM: PORTABLE CHEST 1 VIEW COMPARISON:  12/12/2018 FINDINGS: Cardiac shadow is enlarged. Changes of prior TAVR are again seen. Aortic calcifications are noted. The lungs are well aerated bilaterally. Mild diffuse interstitial changes are noted similar to that seen on the prior exam. Patchy atelectasis/early infiltrate is noted in the left base. No bony abnormality is seen. IMPRESSION: Patchy left basilar atelectasis/early infiltrate. Electronically Signed   By: Inez Catalina M.D.   On: 01/24/2019 20:10   US Abdomen Limited Ruq  Result Date: 01/24/2019 CLINICAL DATA:  85 y/o  M; transaminitis. EXAM: ULTRASOUND ABDOMEN LIMITED RIGHT UPPER QUADRANT COMPARISON:  11/21/2018 CT abdomen and pelvis and abdominal ultrasound. 11/22/2018, 12/18/2018, 01/05/2019 interventional radiology cholecystostomy and cholecystostomy exchanges. FINDINGS: Gallbladder: Cholelithiasis with the largest stone measuring 9 mm near the gallbladder neck. Gallbladder wall thickening to 4.9 mm. Negative sonographic Murphy's sign. No pericholecystic fluid. Linear echogenic structure likely representing the cholecystostomy tube is partially visualized within gallbladder fossa, limited evaluation due to overlying bandaging. Common bile duct: Diameter: 7.3 mm Liver: No focal lesion identified. Within normal limits in parenchymal echogenicity. Portal vein is patent on color Doppler imaging with normal direction of blood flow towards the liver. IMPRESSION: 1. Exam limited due to overlying  bandaging in the right upper quadrant. 2. Mild gallbladder wall thickening, probably chronic sequelae of cholecystitis and cholecystostomy tube. Negative sonographic Murphy's sign. Cholelithiasis again noted. 3. No significant intra or extrahepatic biliary ductal dilatation identified. Electronically Signed   By: Kristine Garbe M.D.   On: 01/24/2019 22:25      Kalman Drape , [facility] 01/25/2019, 8:58 AM  Pager: [phone] Mon-Wed, Friday 7:00am-4:30pm Thurs 7am-11:30am  Consults: [phone]

## 2019-01-25 NOTE — Progress Notes (Signed)
RN spoke to pt's daughter Ebony Hail. Ebony Hail asked for an update about pt's overall condition from [facility] MD. RN paged Sherral Hammers MD to inform him of family's request.   RN spoke with Ebony Hail about procedure of biliary drain being replaced today. RN reviewed information from radiology progress note with her. Daughter thanked Therapist, sports for her time.

## 2019-01-25 NOTE — ED Notes (Signed)
Attempted to update family. Number not available.

## 2019-01-26 DIAGNOSIS — R6520 Severe sepsis without septic shock: Secondary | ICD-10-CM

## 2019-01-26 DIAGNOSIS — Z91013 Allergy to seafood: Secondary | ICD-10-CM

## 2019-01-26 DIAGNOSIS — K8309 Other cholangitis: Secondary | ICD-10-CM

## 2019-01-26 DIAGNOSIS — G7281 Critical illness myopathy: Secondary | ICD-10-CM

## 2019-01-26 DIAGNOSIS — I5022 Chronic systolic (congestive) heart failure: Secondary | ICD-10-CM

## 2019-01-26 DIAGNOSIS — T85698A Other mechanical complication of other specified internal prosthetic devices, implants and grafts, initial encounter: Secondary | ICD-10-CM

## 2019-01-26 DIAGNOSIS — Z95810 Presence of automatic (implantable) cardiac defibrillator: Secondary | ICD-10-CM

## 2019-01-26 DIAGNOSIS — Z8546 Personal history of malignant neoplasm of prostate: Secondary | ICD-10-CM

## 2019-01-26 DIAGNOSIS — Z515 Encounter for palliative care: Secondary | ICD-10-CM

## 2019-01-26 DIAGNOSIS — Z8619 Personal history of other infectious and parasitic diseases: Secondary | ICD-10-CM

## 2019-01-26 DIAGNOSIS — R740 Nonspecific elevation of levels of transaminase and lactic acid dehydrogenase [LDH]: Secondary | ICD-10-CM

## 2019-01-26 DIAGNOSIS — Z8679 Personal history of other diseases of the circulatory system: Secondary | ICD-10-CM

## 2019-01-26 DIAGNOSIS — Z978 Presence of other specified devices: Secondary | ICD-10-CM

## 2019-01-26 DIAGNOSIS — Z888 Allergy status to other drugs, medicaments and biological substances status: Secondary | ICD-10-CM

## 2019-01-26 DIAGNOSIS — Z8709 Personal history of other diseases of the respiratory system: Secondary | ICD-10-CM

## 2019-01-26 DIAGNOSIS — F0390 Unspecified dementia without behavioral disturbance: Secondary | ICD-10-CM

## 2019-01-26 DIAGNOSIS — R7401 Elevation of levels of liver transaminase levels: Secondary | ICD-10-CM

## 2019-01-26 DIAGNOSIS — A021 Salmonella sepsis: Secondary | ICD-10-CM

## 2019-01-26 DIAGNOSIS — Z9049 Acquired absence of other specified parts of digestive tract: Secondary | ICD-10-CM

## 2019-01-26 DIAGNOSIS — G9341 Metabolic encephalopathy: Secondary | ICD-10-CM

## 2019-01-26 DIAGNOSIS — Z952 Presence of prosthetic heart valve: Secondary | ICD-10-CM

## 2019-01-26 DIAGNOSIS — Z7189 Other specified counseling: Secondary | ICD-10-CM

## 2019-01-26 LAB — BASIC METABOLIC PANEL
Anion gap: 9 (ref 5–15)
BUN: 14 mg/dL (ref 8–23)
CO2: 24 mmol/L (ref 22–32)
Calcium: 8.6 mg/dL — ABNORMAL LOW (ref 8.9–10.3)
Chloride: 107 mmol/L (ref 98–111)
Creatinine, Ser: 1.15 mg/dL (ref 0.61–1.24)
GFR calc Af Amer: >60 mL/min (ref >60)
GFR calc non Af Amer: 56 mL/min — ABNORMAL LOW (ref >60)
Glucose, Bld: 115 mg/dL — ABNORMAL HIGH (ref 70–99)
Potassium: 4.5 mmol/L (ref 3.5–5.1)
Sodium: 140 mmol/L (ref 135–145)

## 2019-01-26 LAB — LIPID PANEL
Cholesterol: 139 mg/dL (ref 0–200)
HDL: 22 mg/dL — ABNORMAL LOW (ref >40)
LDL Cholesterol: 101 mg/dL — ABNORMAL HIGH (ref 0–99)
Total CHOL/HDL Ratio: 6.3 RATIO
Triglycerides: 81 mg/dL (ref <150)
VLDL: 16 mg/dL (ref 0–40)

## 2019-01-26 LAB — CBC
HCT: 35.9 % — ABNORMAL LOW (ref 39.0–52.0)
Hemoglobin: 11.8 g/dL — ABNORMAL LOW (ref 13.0–17.0)
MCH: 30.2 pg (ref 26.0–34.0)
MCHC: 32.9 g/dL (ref 30.0–36.0)
MCV: 91.8 fL (ref 80.0–100.0)
Platelets: 123 10*3/uL — ABNORMAL LOW (ref 150–400)
RBC: 3.91 MIL/uL — ABNORMAL LOW (ref 4.22–5.81)
RDW: 16 % — ABNORMAL HIGH (ref 11.5–15.5)
WBC: 7.7 10*3/uL (ref 4.0–10.5)
nRBC: 0 % (ref 0.0–0.2)

## 2019-01-26 LAB — GLUCOSE, CAPILLARY
Glucose-Capillary: 139 mg/dL — ABNORMAL HIGH (ref 70–99)
Glucose-Capillary: 88 mg/dL (ref 70–99)
Glucose-Capillary: 94 mg/dL (ref 70–99)
Glucose-Capillary: 96 mg/dL (ref 70–99)
Glucose-Capillary: 98 mg/dL (ref 70–99)
Glucose-Capillary: 99 mg/dL (ref 70–99)

## 2019-01-26 LAB — MAGNESIUM: Magnesium: 2.2 mg/dL (ref 1.7–2.4)

## 2019-01-26 MED ORDER — SODIUM CHLORIDE 0.9% FLUSH
10.0000 mL | INTRAVENOUS | Status: DC | PRN
  Administered 2019-01-27 – 2019-02-02 (×2): 10 mL
  Filled 2019-01-26 (×2): qty 40

## 2019-01-26 NOTE — Progress Notes (Signed)
PROGRESS NOTE    William [name]  BBC:[id number] DOB: 08/19/1928 DOA: 01/24/2019 PCP: Gayland Curry, DO   Brief Narrative:  85 y.o. WM PMHx Dementia, AAA, aortic stenosis status post TAVR, chronic systolic CHF pulmonary nodules, chronic systolic CHF, history of VT with AICD that was removed due to infection, cholecystitis and cholangitis with biliary drain in place, debility at SNF, now presenting to the emergency department for evaluation of lethargy, fevers, and vomiting.  Patient is unable to contribute much to the history due to his clinical condition.  Per report of the patient's daughter and nursing facility personnel, there has been decreasing output from the patient's biliary drain and he has developed nausea over the past couple days with very poor intake, progressing to lethargy today.  He was reportedly pale, slumped over, and vomiting some brownish material.  He was noted to have a fever to 100.7 F, tachycardic in the 130s, and blood pressure stable initially.  He has not been coughing and has not appeared dyspneic or requiring any oxygen per report.  He was sent into the ED for evaluation of his change in condition.   ED Course: Upon arrival to the ED, patient is found to be febrile to 38.2 C, saturating well on room air, tachycardic in the 120s, initial blood pressure 83 systolic, and normal respiratory rate.  EKG features a sinus tachycardia with chronic LBBB.  Chest x-ray is notable for patchy left basilar atelectasis versus early infiltrate.  Chemistry panel notable for sodium 133, glucose 255, alkaline phosphatase 595, AST and ALT in the 300 range, and total bilirubin of 4.  CBC features a leukocytosis to 11,100 and a slight thrombocytopenia.  Lactic acid is elevated to 2.7.  Blood cultures were collected in the ED, 250 cc normal saline bolus was given, and the patient was treated with vancomycin and Zosyn.  General surgery was consulted by the ED clinician, recommended consultation  with GI, ED PA reports that she is awaiting for callback from GI at this time and requests a medical admission.    Subjective: 4/14 A/O x3 patient does not know (when), per wife who is at bedside patient has not known the month/day in approximately 6 months.   Assessment & Plan:   Principal Problem:   Biliary sepsis Active Problems:   Chronic systolic CHF (congestive heart failure) (HCC)   Ventricular tachycardia (HCC)   Dementia (HCC)   Coronary artery disease involving native coronary artery of native heart without angina pectoris   Hyperglycemia   Acute metabolic encephalopathy  Severe sepsis - Upon admission patient met guidelines for sepsis temp> 30 C, HR> 90, RR> 20 and site of infection occluded biliary drain. -positive Enterobacter from cholecystostomy tube -Most likely secondary to occluded biliary drain.  However patient has been pancultured - Continue broad-spectrum antibiotics per ID - 4/14 RN Trenda Moots  having difficulty flushing cholecystostomy tube may be occluding again.  Cholelithiasis/occluded cholecystostomy tube - 4/13 cholecystostomy tube exchanged - Numerous stones removed along with purulent drainage which was sent for culture. - Cystic duct occluded see results below - CCS feels patient poor candidate at this time for surgery. -Appears cholecystostomy tube may be, becoming occluded again as nursing staff having difficulty flushing.  Per GI note IR will reassess drain in the next few days. Spoke at length with Mrs.Pillard and went over plan for the next few days.  Acute encephalopathy - Patient just returned from the OR unable to evaluate. -Per wife patient becomes confused and  combative at times, does better when there is a sitter with him which he is familiar with. - Currently calm and cooperative with his wife at the bedside - TSH WNL, - Ammonia level WNL   Chronic systolic CHF -EF 25 to 65% with diffuse hypokinesis, mild to moderate MR,  TAVR -Echocardiogram February 2020: EF 25-30% with diffuse hypokinesis, mild-moderate MR, TAVR, negative endocarditis   -Strict in and out +293.5 -Daily weight Filed Weights   01/24/19 1900 01/24/19 2022 01/25/19 0131  Weight: 92 kg 92 kg 91.6 kg   CAD - Chronic LBBB noted on EKG - See chronic systolic CHF  OSA - CPAP per respiratory  Hx DVT - Currently NSR - Patient had AICD but was removed December 2019 due to infection   Hyperglycemia - Serum glucose in the ED 255 - Hemoglobin A1c pending -Lipid panel pending - sensitive SSI  Dementia - Directed to the floor has agreed to allow wife at bedside as this improves patient's compliance with treatment. -Only concern is patient has been in and out of nursing home and as we know currently nursing home radiogram for coronavirus (currently wife asymptomatic)    Goals of care - 4/13 consult palliative care reviewing GIs plan, patient is at high risk for increased morbidity/mortality.  [facility] palliative care spoke with daughter Ebony Hail today again went over the high risk situation patient is an concerning possible surgery and sepsis.  Continue DNR.      DVT prophylaxis: SCD Code Status: Full Family Communication: Spoke with Cephus Richer (daughter) discussed extensively patient's recent hospitalizations and current treatment plan. Disposition Plan: TBD   Consultants:  GI CCS ID    Procedures/Significant Events:  4/12 PCXR: LEFT basilar atelectasis/early infiltrate 4/12 US abdomen RUQ:-Mild gallbladder wall thickening, probably chronic sequelae of cholecystitis and cholecystostomy tube.  -Cholelithiasis again noted -No intra-/extrahepatic biliary ductal dilation noted  4/13 cholecystostomy tube exchange with fluoroscopy:-Innumerable gallstones. Unable to opacify the common bile duct due to obstruction of the cystic duct. -Purulent looking fluid removed from the gallbladder. Fluid sent for culture.      I  have personally reviewed and interpreted all radiology studies and my findings are as above.  VENTILATOR SETTINGS:    Cultures 4/12 blood RIGHT antecubital NGTD 4/12 blood counts right hand NGTD  4/13 gallbladder positive Enterobacter    Antimicrobials: Anti-infectives (From admission, onward)   Start     Stop   01/25/19 2200  vancomycin (VANCOCIN) 1,500 mg in sodium chloride 0.9 % 500 mL IVPB  Status:  Discontinued     01/26/19 1124   01/25/19 0200  ceFEPIme (MAXIPIME) 2 g in sodium chloride 0.9 % 100 mL IVPB         01/24/19 2200  ceFEPIme (MAXIPIME) 2 g in sodium chloride 0.9 % 100 mL IVPB  Status:  Discontinued     01/24/19 2208   01/24/19 2200  metroNIDAZOLE (FLAGYL) IVPB 500 mg         01/24/19 1945  piperacillin-tazobactam (ZOSYN) IVPB 3.375 g     01/24/19 2017   01/24/19 1930  ceFEPIme (MAXIPIME) 2 g in sodium chloride 0.9 % 100 mL IVPB  Status:  Discontinued     01/24/19 2010   01/24/19 1930  metroNIDAZOLE (FLAGYL) IVPB 500 mg  Status:  Discontinued     01/24/19 1931   01/24/19 1930  vancomycin (VANCOCIN) IVPB 1000 mg/200 mL premix  Status:  Discontinued     01/24/19 1922   01/24/19 1930  vancomycin (VANCOCIN) 2,000  mg in sodium chloride 0.9 % 500 mL IVPB     01/25/19 0012       Devices    LINES / TUBES:  Cholecystostomy tube/13>>    Continuous Infusions:  sodium chloride 10 mL/hr at 01/25/19 0007   ceFEPime (MAXIPIME) IV 2 g (01/26/19 0314)   metronidazole 500 mg (01/26/19 2952)   vancomycin 1,500 mg (01/25/19 2135)     Objective: Vitals:   01/26/19 0054 01/26/19 0322 01/26/19 0430 01/26/19 0451  BP: 107/76 107/69  105/76  Pulse: 99 (!) 106  (!) 106  Resp: (!) '21 17  14  ' Temp: 97.7 F (36.5 C) 97.9 F (36.6 C) 97.6 F (36.4 C)   TempSrc: Oral Axillary Oral   SpO2: 98% 98%  100%  Weight:      Height:        Intake/Output Summary (Last 24 hours) at 01/26/2019 8413 Last data filed at 01/26/2019 2440 Gross per 24 hour  Intake 483.53 ml    Output 640 ml  Net -156.47 ml   Filed Weights   01/24/19 1900 01/24/19 2022 01/25/19 0131  Weight: 92 kg 92 kg 91.6 kg    General: A/O x3 (does not know when) no acute respiratory distress Eyes: negative scleral hemorrhage, negative anisocoria, negative icterus ENT: Negative Runny nose, negative gingival bleeding, Neck:  Negative scars, masses, torticollis, lymphadenopathy, JVD Lungs: Clear to auscultation bilaterally without wheezes or crackles Cardiovascular: Regular rate and rhythm without murmur gallop or rub normal S1 and S2 Abdomen: negative abdominal pain, nondistended, positive soft, bowel sounds, no rebound, no ascites, no appreciable mass Extremities: No significant cyanosis, clubbing, or edema bilateral lower extremities Skin: Negative rashes, lesions, ulcers Psychiatric: Positive dementia negative depression, negative anxiety, negative fatigue, negative mania  Central nervous system:  Cranial nerves II through XII intact, tongue/uvula midline, all extremities muscle strength 5/5, sensation intact throughout, negative dysarthria, negative expressive aphasia, negative receptive aphasia.      Data Reviewed: Care during the described time interval was provided by me .  I have reviewed this patient's available data, including medical history, events of note, physical examination, and all test results as part of my evaluation.   CBC: Recent Labs  Lab 01/24/19 1935 01/25/19 0305 01/26/19 0257  WBC 11.1* 10.6* 7.7  NEUTROABS 10.0* 9.0*  --   HGB 12.8* 12.0* 11.8*  HCT 36.6* 35.9* 35.9*  MCV 89.9 89.8 91.8  PLT 146* 132* 102*   Basic Metabolic Panel: Recent Labs  Lab 01/24/19 1935 01/25/19 0305 01/26/19 0257  NA 133* 138 140  K 3.8 3.7 4.5  CL 99 103 107  CO2 '22 23 24  ' GLUCOSE 255* 151* 115*  BUN '16 15 14  ' CREATININE 1.11 1.19 1.15  CALCIUM 8.8* 8.5* 8.6*  MG  --   --  2.2   GFR: Estimated Creatinine Clearance: 49.5 mL/min (by C-G formula based on SCr of  1.15 mg/dL). Liver Function Tests: Recent Labs  Lab 01/24/19 1935 01/25/19 0305  AST 303* 252*  ALT 267* 263*  ALKPHOS 595* 549*  BILITOT 4.0* 4.9*  PROT 6.8 6.4*  ALBUMIN 2.7* 2.5*   Recent Labs  Lab 01/24/19 1935  LIPASE 51   Recent Labs  Lab 01/24/19 2317  AMMONIA 26   Coagulation Profile: Recent Labs  Lab 01/24/19 2218  INR 1.2   Cardiac Enzymes: Recent Labs  Lab 01/24/19 2154  TROPONINI 0.04*   BNP (last 3 results) No results for input(s): PROBNP in the last 8760 hours. HbA1C: No  results for input(s): HGBA1C in the last 72 hours. CBG: Recent Labs  Lab 01/25/19 1620 01/25/19 2018 01/26/19 0013 01/26/19 0428 01/26/19 0755  GLUCAP 104* 100* 99 96 88   Lipid Profile: Recent Labs    01/26/19 0257  CHOL 139  HDL 22*  LDLCALC 101*  TRIG 81  CHOLHDL 6.3   Thyroid Function Tests: Recent Labs    01/25/19 0305  TSH 1.043   Anemia Panel: No results for input(s): VITAMINB12, FOLATE, FERRITIN, TIBC, IRON, RETICCTPCT in the last 72 hours. Urine analysis:    Component Value Date/Time   COLORURINE AMBER (A) 01/24/2019 2138   APPEARANCEUR HAZY (A) 01/24/2019 2138   LABSPEC 1.018 01/24/2019 2138   PHURINE 5.0 01/24/2019 2138   GLUCOSEU 50 (A) 01/24/2019 2138   HGBUR SMALL (A) 01/24/2019 2138   BILIRUBINUR SMALL (A) 01/24/2019 2138   KETONESUR NEGATIVE 01/24/2019 2138   PROTEINUR NEGATIVE 01/24/2019 2138   NITRITE NEGATIVE 01/24/2019 2138   LEUKOCYTESUR LARGE (A) 01/24/2019 2138   Sepsis Labs: '@LABRCNTIP' (procalcitonin:4,lacticidven:4)  ) Recent Results (from the past 240 hour(s))  Blood Culture (routine x 2)     Status: None (Preliminary result)   Collection Time: 01/24/19  7:15 PM  Result Value Ref Range Status   Specimen Description BLOOD RIGHT ANTECUBITAL  Final   Special Requests   Final    BOTTLES DRAWN AEROBIC AND ANAEROBIC Blood Culture results may not be optimal due to an inadequate volume of blood received in culture bottles    Culture   Final    NO GROWTH < 24 HOURS Performed at [facility], [place] [address], [place], [place] [address]    Report Status PENDING  Incomplete  Blood Culture (routine x 2)     Status: None (Preliminary result)   Collection Time: 01/24/19  7:36 PM  Result Value Ref Range Status   Specimen Description BLOOD RIGHT HAND  Final   Special Requests   Final    BOTTLES DRAWN AEROBIC AND ANAEROBIC Blood Culture results may not be optimal due to an inadequate volume of blood received in culture bottles   Culture   Final    NO GROWTH < 24 HOURS Performed at [facility], [place] [address], [place], [place] [address]    Report Status PENDING  Incomplete  Aerobic/Anaerobic Culture (surgical/deep wound)     Status: None (Preliminary result)   Collection Time: 01/25/19  2:57 PM  Result Value Ref Range Status   Specimen Description GALL BLADDER  Final   Special Requests NONE  Final   Gram Stain   Final    ABUNDANT WBC PRESENT, PREDOMINANTLY PMN MODERATE GRAM NEGATIVE RODS RARE GRAM POSITIVE COCCI Performed at [facility], [address], [place], [place] [address]    Culture PENDING  Incomplete   Report Status PENDING  Incomplete         Radiology Studies: Dg Chest Port 1 View  Result Date: 01/24/2019 CLINICAL DATA:  Tachypnea and possible sepsis EXAM: PORTABLE CHEST 1 VIEW COMPARISON:  12/12/2018 FINDINGS: Cardiac shadow is enlarged. Changes of prior TAVR are again seen. Aortic calcifications are noted. The lungs are well aerated bilaterally. Mild diffuse interstitial changes are noted similar to that seen on the prior exam. Patchy atelectasis/early infiltrate is noted in the left base. No bony abnormality is seen. IMPRESSION: Patchy left basilar atelectasis/early infiltrate. Electronically Signed   By: Inez Catalina M.D.   On: 01/24/2019 20:10   Ir Exchange Biliary Drain  Result  Date: 01/25/2019 INDICATION: 85 year old with biliary sepsis and history of a cholecystostomy  tube. EXAM: CHOLECYSTOSTOMY TUBE EXCHANGE WITH FLUOROSCOPY CHOLANGIOGRAM THROUGH EXISTING CATHETER MEDICATIONS: None ANESTHESIA/SEDATION: None FLUOROSCOPY TIME:  Fluoroscopy Time: 1 minutes, 12 seconds, 9 mGy COMPLICATIONS: None immediate. PROCEDURE: Informed written consent was obtained from the patient after a thorough discussion of the procedural risks, benefits and alternatives. All questions were addressed. Maximal Sterile Barrier Technique was utilized including caps, mask, sterile gowns, sterile gloves, sterile drape, hand hygiene and skin antiseptic. A timeout was performed prior to the initiation of the procedure. The existing gallbladder drainage catheter was prepped and draped in a sterile fashion. The retention suture was still intact. It was difficult to flush the catheter and the tube appeared to be partially obstructed. Purulent looking fluid was draining around the catheter during injection of the tube. The tube was found to be within the gallbladder. The retention suture was removed. The catheter was cut. The catheter was removed over a stiff Amplatz wire. A new 12 [place] multipurpose drain was easily advanced over the wire and reconstituted in the gallbladder. Approximately 40 mL of light brown purulent looking fluid was removed from the gallbladder. Small amount of contrast was injected through the gallbladder drain. Drain was flushed with normal saline. Skin was anesthetized with 1% lidocaine and sutured to skin with Prolene suture. FINDINGS: Old catheter was well positioned in the gallbladder but the tube appeared to be partially obstructed. Greater than 40 mL of brown purulent looking fluid was removed from the gallbladder after the drain exchange. Fluid was sent for culture. Cholangiogram demonstrated partial filling of the cystic duct but no filling of the common bile duct. Innumerable gallstones. IMPRESSION: 1. Successful exchange of the cholecystostomy tube. 2. Innumerable gallstones.  Unable to opacify the common bile duct due to obstruction of the cystic duct. 3. Purulent looking fluid removed from the gallbladder. Fluid sent for culture. Electronically Signed   By: Markus Daft M.D.   On: 01/25/2019 17:11   US Abdomen Limited Ruq  Result Date: 01/24/2019 CLINICAL DATA:  85 y/o  M; transaminitis. EXAM: ULTRASOUND ABDOMEN LIMITED RIGHT UPPER QUADRANT COMPARISON:  11/21/2018 CT abdomen and pelvis and abdominal ultrasound. 11/22/2018, 12/18/2018, 01/05/2019 interventional radiology cholecystostomy and cholecystostomy exchanges. FINDINGS: Gallbladder: Cholelithiasis with the largest stone measuring 9 mm near the gallbladder neck. Gallbladder wall thickening to 4.9 mm. Negative sonographic Murphy's sign. No pericholecystic fluid. Linear echogenic structure likely representing the cholecystostomy tube is partially visualized within gallbladder fossa, limited evaluation due to overlying bandaging. Common bile duct: Diameter: 7.3 mm Liver: No focal lesion identified. Within normal limits in parenchymal echogenicity. Portal vein is patent on color Doppler imaging with normal direction of blood flow towards the liver. IMPRESSION: 1. Exam limited due to overlying bandaging in the right upper quadrant. 2. Mild gallbladder wall thickening, probably chronic sequelae of cholecystitis and cholecystostomy tube. Negative sonographic Murphy's sign. Cholelithiasis again noted. 3. No significant intra or extrahepatic biliary ductal dilatation identified. Electronically Signed   By: Kristine Garbe M.D.   On: 01/24/2019 22:25        Scheduled Meds:  amiodarone  100 mg Oral Daily   insulin aspart  0-9 Units Subcutaneous Q4H   pantoprazole (PROTONIX) IV  40 mg Intravenous Q24H   sodium chloride flush  3 mL Intravenous Q12H   sodium chloride flush  3 mL Intravenous Q12H   sodium chloride flush  5 mL Intracatheter Q8H   Continuous Infusions:  sodium chloride 10 mL/hr at 01/25/19 0007  ceFEPime (MAXIPIME) IV 2 g (01/26/19 0314)   metronidazole 500 mg (01/26/19 3244)   vancomycin 1,500 mg (01/25/19 2135)     LOS: 2 days   The patient is critically ill with multiple organ systems failure and requires high complexity decision making for assessment and support, frequent evaluation and titration of therapies, application of advanced monitoring technologies and extensive interpretation of multiple databases. Critical Care Time devoted to patient care services described in this note  Time spent: 40 minutes     Ariyana Faw, Geraldo Docker, MD [facility] Pager [phone]   If 7PM-7AM, please contact night-coverage [email or web address] Password [email or web address] 01/26/2019, 9:17 AM

## 2019-01-26 NOTE — Progress Notes (Signed)
Patient ID: William [name], male   DOB: [birth year], 85 y.o.   MRN: [id number]    Progress Note   Subjective   more alert and agitated during night- alert  And trying this am but confused - cooperative and knows he is confused  Long discussion with pt's daughter per speaker phone while in room Afebrile  BC negative thus far   Bile culture + gram neg rods, rare gram + cocci     Objective   Vital signs in last 24 hours: Temp:  [97.6 F (36.4 C)-98.6 F (37 C)] 97.6 F (36.4 C) (04/14 0430) Pulse Rate:  [93-106] 106 (04/14 0451) Resp:  [14-21] 14 (04/14 0451) BP: (105-110)/(69-77) 105/76 (04/14 0451) SpO2:  [98 %-100 %] 100 % (04/14 0451)   General:    [name] in NAD, jaundiced Heart:  Regular rate and rhythm; no murmurs Lungs: Respirations even and unlabored, lungs CTA bilaterally Abdomen:  Soft, nontender and nondistended. Normal bowel sounds. Biliary drain with minimal  Output brown /coudy small amt heme Extremities:  Without edema. Neurologic:  Alert , cooperative , confused . Psych:  Cooperative. Normal mood and affect.  Intake/Output from previous day: 04/13 0701 - 04/14 0700 In: 483.5 [I.V.:182.9; IV Piggyback:300.6] Out: 640 [Urine:600; Drains:40] Intake/Output this shift: No intake/output data recorded.  Lab Results: Recent Labs    01/24/19 1935 01/25/19 0305 01/26/19 0257  WBC 11.1* 10.6* 7.7  HGB 12.8* 12.0* 11.8*  HCT 36.6* 35.9* 35.9*  PLT 146* 132* 123*   BMET Recent Labs    01/24/19 1935 01/25/19 0305 01/26/19 0257  NA 133* 138 140  K 3.8 3.7 4.5  CL 99 103 107  CO2 '22 23 24  ' GLUCOSE 255* 151* 115*  BUN '16 15 14  ' CREATININE 1.11 1.19 1.15  CALCIUM 8.8* 8.5* 8.6*   LFT Recent Labs    01/25/19 0305  PROT 6.4*  ALBUMIN 2.5*  AST 252*  ALT 263*  ALKPHOS 549*  BILITOT 4.9*  BILIDIR 3.0*   PT/INR Recent Labs    01/24/19 2218  LABPROT 14.9  INR 1.2    Studies/Results: Dg Chest Port 1 View  Result Date: 01/24/2019  CLINICAL DATA:  Tachypnea and possible sepsis EXAM: PORTABLE CHEST 1 VIEW COMPARISON:  12/12/2018 FINDINGS: Cardiac shadow is enlarged. Changes of prior TAVR are again seen. Aortic calcifications are noted. The lungs are well aerated bilaterally. Mild diffuse interstitial changes are noted similar to that seen on the prior exam. Patchy atelectasis/early infiltrate is noted in the left base. No bony abnormality is seen. IMPRESSION: Patchy left basilar atelectasis/early infiltrate. Electronically Signed   By: Inez Catalina M.D.   On: 01/24/2019 20:10   Ir Exchange Biliary Drain  Result Date: 01/25/2019 INDICATION: 85 year old with biliary sepsis and history of a cholecystostomy tube. EXAM: CHOLECYSTOSTOMY TUBE EXCHANGE WITH FLUOROSCOPY CHOLANGIOGRAM THROUGH EXISTING CATHETER MEDICATIONS: None ANESTHESIA/SEDATION: None FLUOROSCOPY TIME:  Fluoroscopy Time: 1 minutes, 12 seconds, 9 mGy COMPLICATIONS: None immediate. PROCEDURE: Informed written consent was obtained from the patient after a thorough discussion of the procedural risks, benefits and alternatives. All questions were addressed. Maximal Sterile Barrier Technique was utilized including caps, mask, sterile gowns, sterile gloves, sterile drape, hand hygiene and skin antiseptic. A timeout was performed prior to the initiation of the procedure. The existing gallbladder drainage catheter was prepped and draped in a sterile fashion. The retention suture was still intact. It was difficult to flush the catheter and the tube appeared to be partially obstructed. Purulent looking fluid was  draining around the catheter during injection of the tube. The tube was found to be within the gallbladder. The retention suture was removed. The catheter was cut. The catheter was removed over a stiff Amplatz wire. A new 12 [place] multipurpose drain was easily advanced over the wire and reconstituted in the gallbladder. Approximately 40 mL of light brown purulent looking fluid was  removed from the gallbladder. Small amount of contrast was injected through the gallbladder drain. Drain was flushed with normal saline. Skin was anesthetized with 1% lidocaine and sutured to skin with Prolene suture. FINDINGS: Old catheter was well positioned in the gallbladder but the tube appeared to be partially obstructed. Greater than 40 mL of brown purulent looking fluid was removed from the gallbladder after the drain exchange. Fluid was sent for culture. Cholangiogram demonstrated partial filling of the cystic duct but no filling of the common bile duct. Innumerable gallstones. IMPRESSION: 1. Successful exchange of the cholecystostomy tube. 2. Innumerable gallstones. Unable to opacify the common bile duct due to obstruction of the cystic duct. 3. Purulent looking fluid removed from the gallbladder. Fluid sent for culture. Electronically Signed   By: Markus Daft M.D.   On: 01/25/2019 17:11   US Abdomen Limited Ruq  Result Date: 01/24/2019 CLINICAL DATA:  85 y/o  M; transaminitis. EXAM: ULTRASOUND ABDOMEN LIMITED RIGHT UPPER QUADRANT COMPARISON:  11/21/2018 CT abdomen and pelvis and abdominal ultrasound. 11/22/2018, 12/18/2018, 01/05/2019 interventional radiology cholecystostomy and cholecystostomy exchanges. FINDINGS: Gallbladder: Cholelithiasis with the largest stone measuring 9 mm near the gallbladder neck. Gallbladder wall thickening to 4.9 mm. Negative sonographic Murphy's sign. No pericholecystic fluid. Linear echogenic structure likely representing the cholecystostomy tube is partially visualized within gallbladder fossa, limited evaluation due to overlying bandaging. Common bile duct: Diameter: 7.3 mm Liver: No focal lesion identified. Within normal limits in parenchymal echogenicity. Portal vein is patent on color Doppler imaging with normal direction of blood flow towards the liver. IMPRESSION: 1. Exam limited due to overlying bandaging in the right upper quadrant. 2. Mild gallbladder wall  thickening, probably chronic sequelae of cholecystitis and cholecystostomy tube. Negative sonographic Murphy's sign. Cholelithiasis again noted. 3. No significant intra or extrahepatic biliary ductal dilatation identified. Electronically Signed   By: Kristine Garbe M.D.   On: 01/24/2019 22:25       Assessment / Plan:     #1 85 yo WM with history of acute cholecystitis February 6160 complicated by sepsis and respiratory failure requiring vent support.  Patient underwent cholecystostomy ventrally improved and was returned to his skilled nursing facility with biliary drain in place.  Readmitted early yesterday with lethargy, fever nausea leukocytosis and elevated LFTs. There was initial concern for possible choledocholithiasis.  Patient underwent drain injection and exchange yesterday with finding of obstructed drain.  There is a least 40 cc of purulent fluid removed from the gallbladder, and cystic duct remained obstructed, therefore unable to evaluate CBD.  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  Blood cultures to date are negative, bile Gram stain positive  Patient clinically is much improved today  #2 dementia #3 cardiomyopathy with EF 25 to 30% #4 chronic antiplatelet therapy-on Plavix currently on hold #5 status post aortic valve replacement #6 adult onset diabetes mellitus   Plan; advance diet as tolerated Follow LFTs serially Continue broad-spectrum antibiotics Await final cultures IR to reassess drain in the next few days and depending on course and resolution of LFTs will decide if PTC  indicated.  Patient's daughter was updated today by phone.  Family is interested in hiring a sitter which patient is used to.  Have spoken to charge nurse and they are pursuing this.         Principal Problem:   Biliary sepsis Active Problems:   Chronic systolic CHF  (congestive heart failure) (HCC)   Ventricular tachycardia (HCC)   Dementia (HCC)   Coronary artery disease involving native coronary artery of native heart without angina pectoris   Hyperglycemia   Acute metabolic encephalopathy     LOS: 2 days   Angele Wiemann  01/26/2019, 10:25 AM

## 2019-01-26 NOTE — Progress Notes (Signed)
Date of Admission:  01/24/2019          Reason for Consult: Cholangitis with sepsis    Referring Provider: Dr Sherral Hammers (family requested that we check on pt)   Assessment:  1. Cholangitis due to obstructed cholecystostomy drain 2. Enterobacter isolated from purulent material from drain exchange 3. 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 Hx Enterobacter absuriae bacteremia 5. Hx of ESBL bacteremia in presence of TAVR and ICD sp explantation of ICD 6. Dementia 7. CHF 8. Hx of Ventricular Tachycardia  Plan:  1. Continue Cefepime, metronidazole,  2. Followup culture data, sensis from pus obtained by IR 3. Agree with palliative care consult  Principal Problem:   Biliary sepsis Active Problems:   Chronic systolic CHF (congestive heart failure) (HCC)   Ventricular tachycardia (HCC)   Dementia (HCC)   Coronary artery disease involving native coronary artery of native heart without angina pectoris   Hyperglycemia   Acute metabolic encephalopathy   Scheduled Meds: . amiodarone  100 mg Oral Daily  . insulin aspart  0-9 Units Subcutaneous Q4H  . pantoprazole (PROTONIX) IV  40 mg Intravenous Q24H  . sodium chloride flush  3 mL Intravenous Q12H  . sodium chloride flush  3 mL Intravenous Q12H  . sodium chloride flush  5 mL Intracatheter Q8H   Continuous Infusions: . sodium chloride 10 mL/hr at 01/25/19 0007  . ceFEPime (MAXIPIME) IV 2 g (01/26/19 0314)  . metronidazole 500 mg (01/26/19 9476)   PRN Meds:.sodium chloride, acetaminophen **OR** acetaminophen, iohexol, lidocaine (PF), ondansetron **OR** ondansetron (ZOFRAN) IV, sodium chloride flush  HPI: William [name] is a 85 y.o. male  with previous medical history or sleep apnea, prostate cancer, dementia, chronic systolic heart failure s/p TAVR (08/2018) and ICD (placed 2015 and removed 09/2018)    He had recently completed 6 weeks of Invanz for ESBL producing  Klebsiella bacteremia in January 2020.   IN February he was admitted cholangitis and sepsis.  A percutaneous drain was placed with aspiration cultures positive for Enterococcus faecium and Enterobacter Asburiae. Blood cultures were positive for Enterobacter Cloacae. Enterococcus was ampicillin sensitive and was placed on 2 weeks of IV piperacillin-tazobactam.  Throughout acute sick echocardiogram at that time showed no evidence of endocarditis.  He followed up with Terri Piedra in the clinic in March and had been doing well.  The week prior to admission he was apparently having decreased output from his biliary drain and developed nausea and reduced appetite and increasing lethargy.  He was pale and found slumped over wet vomiting some brownish material.  He was found to be febrile and tachycardic.  He was brought to the emergency department where his febrile tachycardic with blood pressures in the 80s.  He had elevated transaminases and elevated bilirubin consistent with obstructive pathology.  Blood cultures were taken he was fluid resuscitated and started on Zosyn.  Surgery were consulted and CT of the abdomen pelvis was performed.  He underwent drain injection by interventional radiology exchange of the drain and finding of obstructive drain with 40 mL of purulent material removed.  The cystic duct apparently was still obstructed and they were not able to evaluate the common bile duct.   His Gram stain of his this material showed gram-negative rods as well as a few gram-positive cocci.  Cultures are only growing a gram-negative rod in abundance which is Enterobacter.  He has been on vancomycin cefepime and metronidazole.  Yesterday  his daughter reached out to our clinic and asked if our team could check in on the patient which we have done so today.  On exam when I saw William [name] today he remains quite confused and was holding the remote control to his television as if it was a telephone.  He  appeared a bit pale but was not clearly tender on exam of his abdomen.  I will for now continue him on cefepime and metronidazole but discontinue the vancomycin.  We will follow-up on the culture data.  I agree with palliative care consult.   Review of Systems: Review of Systems  Unable to perform ROS: Dementia    Past Medical History:  Diagnosis Date  . AAA (abdominal aortic aneurysm) ([place])    7/13 3.8cm  . Arthritis    "joints" (01/11/2014)  . Asthma    "seasonal; some foods"   . Chronic systolic CHF (congestive heart failure) ([place])   . Dementia ([place])   . HLD (hyperlipidemia)   . HOH (hard of hearing)   . Lumbar vertebral fracture (HCC)    L1- 04/11/2014   . Prostate cancer ([place])   . S/P ICD (internal cardiac defibrillator) procedure, 01/11/14 removal of ERI gen and placement of Medtronic Evera XT VR & NEW Right ventricular lead Medtronic 01/12/2014  . S/P TAVR (transcatheter aortic valve replacement)    Edwards Sapien 3 THV (size 29 mm, model # [id number], serial # [id number])  . Severe aortic stenosis   . Sleep apnea    "lost 60# & don't have it anymore" (01/11/2014)  . Ventricular tachycardia (HCC)     Social History   Tobacco Use  . Smoking status: Never Smoker  . Smokeless tobacco: Never Used  Substance Use Topics  . Alcohol use: Yes    Alcohol/week: 0.0 standard drinks    Comment: 01/11/2014 "drink 1/2 of a beer twice per month   . Drug use: No    Family History  Problem Relation Age of Onset  . Heart failure Mother        Died of "old age" at 53  . Pneumonia Father    Allergies  Allergen Reactions  . Crestor [Rosuvastatin] Other (See Comments)    Hurts muscles  . Lipitor [Atorvastatin] Other (See Comments)    Hurts stomach  . Shrimp [Shellfish Allergy] Other (See Comments)    On MAR    OBJECTIVE: Blood pressure 105/76, pulse (!) 106, temperature 97.6 F (36.4 C), temperature source Oral, resp. rate 14, height 5\' 10"  (1.778 m), weight 91.6 kg, SpO2 100  %.  Physical Exam HENT:     Head: Normocephalic and atraumatic.  Cardiovascular:     Heart sounds: No murmur. No friction rub. No gallop.   Pulmonary:     Effort: Pulmonary effort is normal. No respiratory distress.     Breath sounds: Normal breath sounds. No stridor. No rhonchi.  Abdominal:     General: Abdomen is flat.     Palpations: There is no mass.     Tenderness: There is no abdominal tenderness.     Hernia: No hernia is present.  Skin:    General: Skin is warm.  Neurological:     General: No focal deficit present.     Mental Status: He is alert. He is disoriented.  Psychiatric:        Behavior: Behavior is cooperative.        Cognition and Memory: Cognition is impaired. Memory is impaired.   Drain in place  Lab Results Lab Results  Component Value Date   WBC 7.7 01/26/2019   HGB 11.8 (L) 01/26/2019   HCT 35.9 (L) 01/26/2019   MCV 91.8 01/26/2019   PLT 123 (L) 01/26/2019    Lab Results  Component Value Date   CREATININE 1.15 01/26/2019   BUN 14 01/26/2019   NA 140 01/26/2019   K 4.5 01/26/2019   CL 107 01/26/2019   CO2 24 01/26/2019    Lab Results  Component Value Date   ALT 263 (H) 01/25/2019   AST 252 (H) 01/25/2019   ALKPHOS 549 (H) 01/25/2019   BILITOT 4.9 (H) 01/25/2019     Microbiology: Recent Results (from the past 240 hour(s))  Blood Culture (routine x 2)     Status: None (Preliminary result)   Collection Time: 01/24/19  7:15 PM  Result Value Ref Range Status   Specimen Description BLOOD RIGHT ANTECUBITAL  Final   Special Requests   Final    BOTTLES DRAWN AEROBIC AND ANAEROBIC Blood Culture results may not be optimal due to an inadequate volume of blood received in culture bottles   Culture   Final    NO GROWTH 2 DAYS Performed at [facility], [place] [address] [place], [place] [address]    Report Status PENDING  Incomplete  Blood Culture (routine x 2)     Status: None (Preliminary result)   Collection Time: 01/24/19  7:36 PM   Result Value Ref Range Status   Specimen Description BLOOD RIGHT HAND  Final   Special Requests   Final    BOTTLES DRAWN AEROBIC AND ANAEROBIC Blood Culture results may not be optimal due to an inadequate volume of blood received in culture bottles   Culture   Final    NO GROWTH 2 DAYS Performed at [facility], [place] [address], [place], [place] [address]    Report Status PENDING  Incomplete  Aerobic/Anaerobic Culture (surgical/deep wound)     Status: None (Preliminary result)   Collection Time: 01/25/19  2:57 PM  Result Value Ref Range Status   Specimen Description GALL BLADDER  Final   Special Requests NONE  Final   Gram Stain   Final    ABUNDANT WBC PRESENT, PREDOMINANTLY PMN MODERATE GRAM NEGATIVE RODS RARE GRAM POSITIVE COCCI    Culture   Final    ABUNDANT ENTEROBACTER SPECIES SUSCEPTIBILITIES TO FOLLOW Performed at [facility], [place] [address], [place], [place] [address]    Report Status PENDING  Incomplete    Alcide Evener, [place] [facility] [phone] pager  01/26/2019, 1:09 PM

## 2019-01-26 NOTE — Consult Note (Signed)
Consultation Note Date: 01/26/2019   Patient Name: William [name]  DOB: [birth year]  MRN: [id number]  Age / Sex: 85 y.o., male  PCP: Gayland Curry, DO Referring Physician: Allie Bossier, MD  Reason for Consultation: Establishing goals of care  HPI/Patient Profile: 85 y.o. male  with past medical history of ESBL bacteremia, dementia, CHF, V tach, ICD (now removed after bacteremia), TAVR, CAD, OSA, and prostate cancer admitted on 01/24/2019 with cholangitis and sepsis. Patient had recent admission in Feb for same. At that time, a percutaneous drain was placed. Prior to this admission, patient was having decreased output from his drain. He became lethargic and nauseous. He was found slumped over with a fever and brought to ED. Found to have elevated LFTs. His drain was exchanged by IR d/t obstruction. PMT consulted for [place].  Clinical Assessment and Goals of Care: I have reviewed medical records including EPIC notes, labs and imaging, received report from RN, and spoke with patient's daughter, William [name], to discuss diagnosis prognosis, [place], EOL wishes, disposition and options.  Ebony Hail was pleasant; but, somewhat resistant to palliative care consult. She initially tells me "I know you are supposed to convince [place] to let him die". I introduced Palliative Medicine as specialized medical care for people living with serious illness. It focuses on providing relief from the symptoms and stress of a serious illness. The goal is to improve quality of life for both the patient and the family. I discussed with her that my role was to be an extra layer of support for her family and assist them in whatever their goals of care may be.   We discussed a brief life review of the patient. 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 She describes him as "tough" and "kind". She tells  me recently he has found pleasure in watching [employer], golf, and [employer].  As far as functional and nutritional status, she tells me he was been weaker and not eating as well recently. She tells me of some cognitive decline - tells me he has mild dementia. She shares about agitation at night and decrease in language skills.  Ebony Hail tells me about multiple hospitalizations since Fall of 8101 and many complications from those hospitalizations. She also shares concerns about care patient was receiving at SNF.   We discussed his current illness and what it means in the larger context of his on-going co-morbidities.  Natural disease trajectory and expectations at EOL were discussed. We discussed his recurrent infections. We discussed concern of repeated cholangitis. We discussed high risk for surgery. We discussed progressive nature of dementia and what that entails.   Ebony Hail is quite focused on continuing aggressive care. She is hopeful that her father recovers from current illness and can eventually have cholecystectomy. We discussed concerns over Mr. Ayyad's quality of life - she shares that lately she feels it has been poor but she feels he can return to great quality of life.   She tells me many times how she is frustrated that his healthcare team wants to "let him die". I assured her several times that Mr. Pfalzgraf is receiving aggressive medical care and this will be continued.   Advance directives, concepts specific to code status, artifical feeding and hydration, and rehospitalization were considered and discussed. Apparently, a recent code status conversation was had with patient's wife at facility and wife elected full code.   I  offered to Ebony Hail that PMT could continue to check on her father and speak with her as needs arise. She was agreeable to this.   Questions and concerns were addressed. The family was encouraged to call with questions or concerns.   Primary Decision Maker  NEXT OF KIN - spouse - [name], retail    SUMMARY OF RECOMMENDATIONS   - full code/full scope care - do not think family grasps frailty of patient - they remain hopeful for extensive recovery - PMT will shadow and reengage as needed  Code Status/Advance Care Planning:  Full code   Symptom Management:   Per primary team  Prognosis:   Unable to determine  Discharge Planning: To Be Determined      Primary Diagnoses: Present on Admission: . Biliary sepsis . Coronary artery disease involving native coronary artery of native heart without angina pectoris . Chronic systolic CHF (congestive heart failure) ([place]) . Dementia ([place]) . Hyperglycemia . Acute metabolic encephalopathy   I have reviewed the medical record, interviewed the patient and family, and examined the patient. The following aspects are pertinent.  Past Medical History:  Diagnosis Date  . AAA (abdominal aortic aneurysm) ([facility])    7/13 3.8cm  . Arthritis    "joints" (01/11/2014)  . Asthma    "seasonal; some foods"   . Chronic systolic CHF (congestive heart failure) ([place])   . Dementia ([place])   . HLD (hyperlipidemia)   . HOH (hard of hearing)   . Lumbar vertebral fracture (H[facility])    L1- 04/11/2014   . Prostate cancer ([place])   . S/P ICD (internal cardiac defibrillator) procedure, 01/11/14 removal of ERI gen and placement of Medtronic Evera XT VR & NEW Right ventricular lead Medtronic 01/12/2014  . S/P TAVR (transcatheter aortic valve replacement)    Edwards Sapien 3 THV (size 29 mm, model # [id number], serial # [id number])  . Severe aortic stenosis   . Sleep apnea    "lost 60# & don't have it anymore" (01/11/2014)  . Ventricular tachycardia (HCC)    Social History   Socioeconomic History  . Marital status: Married    Spouse name: Not on file  . Number of children: 2  . Years of education: Not on file  . Highest education level: Not on file  Occupational History  . Occupation: Owned a Scientist, [employer]"   Social Needs  . Financial resource strain: Not on file  . Food insecurity:    Worry: Not on file    Inability: Not on file  . Transportation needs:    Medical: Not on file    Non-medical: Not on file  Tobacco Use  . Smoking status: Never Smoker  . Smokeless tobacco: Never Used  Substance and Sexual Activity  . Alcohol use: Yes    Alcohol/week: 0.0 standard drinks    Comment: 01/11/2014 "drink 1/2 of a beer twice per month   . Drug use: No  . Sexual activity: Not Currently  Lifestyle  . Physical activity:    Days per week: Not on file    Minutes per session: Not on file  . Stress: Not on file  Relationships  . Social connections:    Talks on phone: Not on file    Gets together: Not on file    Attends religious service: Not on file    Active member of club or organization: Not on file    Attends meetings of clubs or organizations: Not on file    Relationship status: Not on file  Other Topics Concern  . Not on file  Social History Narrative  . Not on file   Family History  Problem Relation Age of Onset  . Heart failure Mother        Died of "old age" at 30  . Pneumonia Father    Scheduled Meds: . amiodarone  100 mg Oral Daily  . insulin aspart  0-9 Units Subcutaneous Q4H  . pantoprazole (PROTONIX) IV  40 mg Intravenous Q24H  . sodium chloride flush  3 mL Intravenous Q12H  . sodium chloride flush  3 mL Intravenous Q12H  . sodium chloride flush  5 mL Intracatheter Q8H   Continuous Infusions: . sodium chloride 10 mL/hr at 01/25/19 0007  . ceFEPime (MAXIPIME) IV 2 g (01/26/19 0314)  . metronidazole 500 mg (01/26/19 1556)   PRN Meds:.sodium chloride, acetaminophen **OR** acetaminophen, iohexol, lidocaine (PF), ondansetron **OR** ondansetron (ZOFRAN) IV, sodium chloride flush, sodium chloride flush Allergies  Allergen Reactions  . Crestor [Rosuvastatin] Other (See Comments)    Hurts muscles  . Lipitor [Atorvastatin] Other (See Comments)    Hurts stomach  . Shrimp  [Shellfish Allergy] Other (See Comments)    On MAR    Vital Signs: BP 105/76   Pulse (!) 106   Temp 97.6 F (36.4 C) (Oral)   Resp 14   Ht 5\' 10"  (1.778 m)   Wt 91.6 kg   SpO2 100%   BMI 28.98 kg/m  Pain Scale: 0-10   Pain Score: 0-No pain   SpO2: SpO2: 100 % O2 Device:SpO2: 100 % O2 Flow Rate: .   IO: Intake/output summary:   Intake/Output Summary (Last 24 hours) at 01/26/2019 1621 Last data filed at 01/26/2019 2683 Gross per 24 hour  Intake 483.53 ml  Output 40 ml  Net 443.53 ml    LBM:   Baseline Weight: Weight: 92 kg Most recent weight: Weight: 91.6 kg     Palliative Assessment/Data: PPS 30%    The above conversation was completed via telephone due to the visitor restrictions during the COVID-19 pandemic. Thorough chart review and discussion with necessary members of the care team was completed as part of assessment. All issues were discussed and addressed but no physical exam was performed.   Time Total: 70 minutes Greater than 50%  of this time was spent counseling and coordinating care related to the above assessment and plan.  Juel Burrow, DNP, AGNP-C Palliative Medicine Team [phone] Pager: [phone]

## 2019-01-26 NOTE — Progress Notes (Signed)
Upon shift assessment patient removed own left hand IV. Soiled bed; abd binder removed and reordered.Advised day RN to reapply abdominal binder when patient is more awake during the day.   Informed day RN to request midline for patient if right hand IV site it lost.  Patient aroused from sleep approximately 0155; observed tachy in 160's; not symptomatic. Patient disoriented, restless and began speaking; patient stated he must have been dreaming. RN comforted patient and encouraged patient to return to sleep.   With 0300 lab draw patient became agitated and began speaking loudly. Patient disoriented and repeatedly questioned the phlebotomist and RN why he was here (in the hospital). Patient stated did not want to be touched unless he knew why he was here. Encouraged patient to lower voice and attempted to reorient patient by informing patient why he was hospitalized and that his family was aware that he was in the hospital. Patient able to be calmed and returned to rest.

## 2019-01-26 NOTE — Progress Notes (Signed)
RN received report from night shift RN (William [name]) that pt had increased confusion, agitation, and combativeness overnight during personal care. RN reported findings to pt's daughter William [name] when she called for an update. William [name] report that at pt's facility he has a 24 hour caregiver due to his increased delirium. William [name] requested if a private caregiver or family member could come to sit with pt.   Pt's delirium continued to worsen including him pulling out final peripheral IV and using call bell light as a telephone.   RN reviewed William [name]'s request with charge nurse (William [name]) and Therapist, sports William [name]). Clearance was received for pt to have the same person come to sit with pt intermittently. RN reviewed with William [name] that pt would have to complete symptom survey at main entrance upon arrival during every visit and that a mask will need to be worn at all times. William [name] verbalized understanding.   William [name] called back to report that family decided for pt's wife William [name]) to be sole family member to sit with pt to help his delirium. RN reviewed risks involved with hospital visits in general and stated to daughter that their is a higher risk for people of pt and wife's age to be diagnosed with Covid 41. William [name] verbalized understanding and stated that she and family still wanted wife to be chosen visitor regardless of risk.   RN will continue to monitor pt.

## 2019-01-27 ENCOUNTER — Inpatient Hospital Stay (HOSPITAL_COMMUNITY): Payer: Medicare Other

## 2019-01-27 ENCOUNTER — Encounter (HOSPITAL_COMMUNITY): Payer: Self-pay | Admitting: Radiology

## 2019-01-27 DIAGNOSIS — Z01810 Encounter for preprocedural cardiovascular examination: Secondary | ICD-10-CM

## 2019-01-27 DIAGNOSIS — Z515 Encounter for palliative care: Secondary | ICD-10-CM

## 2019-01-27 DIAGNOSIS — A419 Sepsis, unspecified organism: Secondary | ICD-10-CM

## 2019-01-27 DIAGNOSIS — R410 Disorientation, unspecified: Secondary | ICD-10-CM

## 2019-01-27 DIAGNOSIS — T85598A Other mechanical complication of other gastrointestinal prosthetic devices, implants and grafts, initial encounter: Secondary | ICD-10-CM

## 2019-01-27 LAB — COMPREHENSIVE METABOLIC PANEL
ALT: 119 U/L — ABNORMAL HIGH (ref 0–44)
AST: 52 U/L — ABNORMAL HIGH (ref 15–41)
Albumin: 2.3 g/dL — ABNORMAL LOW (ref 3.5–5.0)
Alkaline Phosphatase: 373 U/L — ABNORMAL HIGH (ref 38–126)
Anion gap: 12 (ref 5–15)
BUN: 18 mg/dL (ref 8–23)
CO2: 22 mmol/L (ref 22–32)
Calcium: 8.4 mg/dL — ABNORMAL LOW (ref 8.9–10.3)
Chloride: 105 mmol/L (ref 98–111)
Creatinine, Ser: 1 mg/dL (ref 0.61–1.24)
GFR calc Af Amer: >60 mL/min (ref >60)
GFR calc non Af Amer: >60 mL/min (ref >60)
Glucose, Bld: 115 mg/dL — ABNORMAL HIGH (ref 70–99)
Potassium: 3 mmol/L — ABNORMAL LOW (ref 3.5–5.1)
Sodium: 139 mmol/L (ref 135–145)
Total Bilirubin: 1.7 mg/dL — ABNORMAL HIGH (ref 0.3–1.2)
Total Protein: 6.1 g/dL — ABNORMAL LOW (ref 6.5–8.1)

## 2019-01-27 LAB — CBC
HCT: 34.2 % — ABNORMAL LOW (ref 39.0–52.0)
Hemoglobin: 11.1 g/dL — ABNORMAL LOW (ref 13.0–17.0)
MCH: 30.2 pg (ref 26.0–34.0)
MCHC: 32.5 g/dL (ref 30.0–36.0)
MCV: 92.9 fL (ref 80.0–100.0)
Platelets: 124 10*3/uL — ABNORMAL LOW (ref 150–400)
RBC: 3.68 MIL/uL — ABNORMAL LOW (ref 4.22–5.81)
RDW: 15.8 % — ABNORMAL HIGH (ref 11.5–15.5)
WBC: 5.6 10*3/uL (ref 4.0–10.5)
nRBC: 0 % (ref 0.0–0.2)

## 2019-01-27 LAB — HEMOGLOBIN A1C
Hgb A1c MFr Bld: 5.9 % — ABNORMAL HIGH (ref 4.8–5.6)
Hgb A1c MFr Bld: 5.7 % — ABNORMAL HIGH (ref 4.8–5.6)
Mean Plasma Glucose: 123 mg/dL
Mean Plasma Glucose: 116.89 mg/dL

## 2019-01-27 LAB — LIPID PANEL
Cholesterol: 140 mg/dL (ref 0–200)
HDL: 22 mg/dL — ABNORMAL LOW (ref >40)
LDL Cholesterol: 100 mg/dL — ABNORMAL HIGH (ref 0–99)
Total CHOL/HDL Ratio: 6.4 RATIO
Triglycerides: 91 mg/dL (ref <150)
VLDL: 18 mg/dL (ref 0–40)

## 2019-01-27 LAB — GLUCOSE, CAPILLARY
Glucose-Capillary: 101 mg/dL — ABNORMAL HIGH (ref 70–99)
Glucose-Capillary: 107 mg/dL — ABNORMAL HIGH (ref 70–99)
Glucose-Capillary: 111 mg/dL — ABNORMAL HIGH (ref 70–99)
Glucose-Capillary: 122 mg/dL — ABNORMAL HIGH (ref 70–99)
Glucose-Capillary: 168 mg/dL — ABNORMAL HIGH (ref 70–99)
Glucose-Capillary: 92 mg/dL (ref 70–99)

## 2019-01-27 LAB — MAGNESIUM: Magnesium: 2.1 mg/dL (ref 1.7–2.4)

## 2019-01-27 MED ORDER — POTASSIUM CHLORIDE CRYS ER 20 MEQ PO TBCR
40.0000 meq | EXTENDED_RELEASE_TABLET | Freq: Once | ORAL | Status: AC
  Administered 2019-01-27: 40 meq via ORAL
  Filled 2019-01-27: qty 2

## 2019-01-27 MED ORDER — GADOBUTROL 1 MMOL/ML IV SOLN
9.0000 mL | Freq: Once | INTRAVENOUS | Status: AC | PRN
  Administered 2019-01-27: 9 mL via INTRAVENOUS

## 2019-01-27 NOTE — Progress Notes (Signed)
RT set up CPAP and placed on patient. Patient tolerating CPAP well at this time. RT will monitor as needed. 

## 2019-01-27 NOTE — Progress Notes (Signed)
Subjective:  Patient is delirious   Antibiotics:  Anti-infectives (From admission, onward)   Start     Dose/Rate Route Frequency Ordered Stop   01/25/19 2200  vancomycin (VANCOCIN) 1,500 mg in sodium chloride 0.9 % 500 mL IVPB  Status:  Discontinued     1,500 mg 250 mL/hr over 120 Minutes Intravenous Every 24 hours 01/24/19 2027 01/26/19 1124   01/25/19 0200  ceFEPIme (MAXIPIME) 2 g in sodium chloride 0.9 % 100 mL IVPB     2 g 200 mL/hr over 30 Minutes Intravenous Every 12 hours 01/24/19 2211     01/24/19 2200  ceFEPIme (MAXIPIME) 2 g in sodium chloride 0.9 % 100 mL IVPB  Status:  Discontinued     2 g 200 mL/hr over 30 Minutes Intravenous  Once 01/24/19 2158 01/24/19 2208   01/24/19 2200  metroNIDAZOLE (FLAGYL) IVPB 500 mg     500 mg 100 mL/hr over 60 Minutes Intravenous Every 8 hours 01/24/19 2158     01/24/19 1945  piperacillin-tazobactam (ZOSYN) IVPB 3.375 g     3.375 g 100 mL/hr over 30 Minutes Intravenous  Once 01/24/19 1931 01/24/19 2017   01/24/19 1930  ceFEPIme (MAXIPIME) 2 g in sodium chloride 0.9 % 100 mL IVPB  Status:  Discontinued     2 g 200 mL/hr over 30 Minutes Intravenous  Once 01/24/19 1918 01/24/19 2010   01/24/19 1930  metroNIDAZOLE (FLAGYL) IVPB 500 mg  Status:  Discontinued     500 mg 100 mL/hr over 60 Minutes Intravenous  Once 01/24/19 1918 01/24/19 1931   01/24/19 1930  vancomycin (VANCOCIN) IVPB 1000 mg/200 mL premix  Status:  Discontinued     1,000 mg 200 mL/hr over 60 Minutes Intravenous  Once 01/24/19 1918 01/24/19 1922   01/24/19 1930  vancomycin (VANCOCIN) 2,000 mg in sodium chloride 0.9 % 500 mL IVPB     2,000 mg 250 mL/hr over 120 Minutes Intravenous  Once 01/24/19 1922 01/25/19 0012      Medications: Scheduled Meds: . amiodarone  100 mg Oral Daily  . insulin aspart  0-9 Units Subcutaneous Q4H  . pantoprazole (PROTONIX) IV  40 mg Intravenous Q24H  . sodium chloride flush  3 mL Intravenous Q12H  . sodium chloride flush  3 mL  Intravenous Q12H  . sodium chloride flush  5 mL Intracatheter Q8H   Continuous Infusions: . sodium chloride 10 mL/hr at 01/25/19 0007  . ceFEPime (MAXIPIME) IV 2 g (01/27/19 0140)  . metronidazole 500 mg (01/27/19 0608)   PRN Meds:.sodium chloride, acetaminophen **OR** acetaminophen, iohexol, lidocaine (PF), ondansetron **OR** ondansetron (ZOFRAN) IV, sodium chloride flush, sodium chloride flush    Objective: Weight change:   Intake/Output Summary (Last 24 hours) at 01/27/2019 1044 Last data filed at 01/27/2019 0608 Gross per 24 hour  Intake 10 ml  Output 1225 ml  Net -1215 ml   Blood pressure 120/75, pulse 84, temperature 97.7 F (36.5 C), temperature source Axillary, resp. rate 20, height 5\' 10"  (1.778 m), weight 89.9 kg, SpO2 100 %. Temp:  [97.7 F (36.5 C)-98.2 F (36.8 C)] 97.7 F (36.5 C) (04/15 0753) Pulse Rate:  [84-100] 84 (04/15 0753) Resp:  [15-21] 20 (04/15 0753) BP: (114-120)/(72-76) 120/75 (04/15 0753) SpO2:  [99 %-100 %] 100 % (04/15 0753) Weight:  [89.9 kg] 89.9 kg (04/15 0500)  Physical Exam: General: Alert and awake, recognizes his wife but at times laughs inappropriately and seems quite confused HEENT: anicteric sclera, EOMI CVS regular  rate, normal  Chest: , no wheezing, no respiratory distress Abdomen: soft non-distended, and surprisingly nontender drain with bilious material Skin: no rashes Neuro: nonfocal  CBC:    BMET Recent Labs    01/26/19 0257 01/27/19 0425  NA 140 139  K 4.5 3.0*  CL 107 105  CO2 24 22  GLUCOSE 115* 115*  BUN 14 18  CREATININE 1.15 1.00  CALCIUM 8.6* 8.4*     Liver Panel  Recent Labs    01/25/19 0305 01/27/19 0425  PROT 6.4* 6.1*  ALBUMIN 2.5* 2.3*  AST 252* 52*  ALT 263* 119*  ALKPHOS 549* 373*  BILITOT 4.9* 1.7*  BILIDIR 3.0*  --        Sedimentation Rate No results for input(s): ESRSEDRATE in the last 72 hours. C-Reactive Protein No results for input(s): CRP in the last 72 hours.  Micro  Results: Recent Results (from the past 720 hour(s))  Blood Culture (routine x 2)     Status: None (Preliminary result)   Collection Time: 01/24/19  7:15 PM  Result Value Ref Range Status   Specimen Description BLOOD RIGHT ANTECUBITAL  Final   Special Requests   Final    BOTTLES DRAWN AEROBIC AND ANAEROBIC Blood Culture results may not be optimal due to an inadequate volume of blood received in culture bottles   Culture   Final    NO GROWTH 2 DAYS Performed at [facility] [address], [place], [place] [address]    Report Status PENDING  Incomplete  Blood Culture (routine x 2)     Status: None (Preliminary result)   Collection Time: 01/24/19  7:36 PM  Result Value Ref Range Status   Specimen Description BLOOD RIGHT HAND  Final   Special Requests   Final    BOTTLES DRAWN AEROBIC AND ANAEROBIC Blood Culture results may not be optimal due to an inadequate volume of blood received in culture bottles   Culture   Final    NO GROWTH 2 DAYS Performed at [facility], [place] [address], [place], [place] [address]    Report Status PENDING  Incomplete  Aerobic/Anaerobic Culture (surgical/deep wound)     Status: None (Preliminary result)   Collection Time: 01/25/19  2:57 PM  Result Value Ref Range Status   Specimen Description GALL BLADDER  Final   Special Requests NONE  Final   Gram Stain   Final    ABUNDANT WBC PRESENT, PREDOMINANTLY PMN MODERATE GRAM NEGATIVE RODS RARE GRAM POSITIVE COCCI    Culture   Final    ABUNDANT ENTEROBACTER SPECIES SUSCEPTIBILITIES TO FOLLOW Performed at [facility], [place] [address] [place], [place] [address]    Report Status PENDING  Incomplete    Studies/Results: Ir Exchange Biliary Drain  Result Date: 01/25/2019 INDICATION: 85 year old with biliary sepsis and history of a cholecystostomy tube. EXAM: CHOLECYSTOSTOMY TUBE EXCHANGE WITH FLUOROSCOPY CHOLANGIOGRAM THROUGH EXISTING CATHETER MEDICATIONS: None ANESTHESIA/SEDATION: None  FLUOROSCOPY TIME:  Fluoroscopy Time: 1 minutes, 12 seconds, 9 mGy COMPLICATIONS: None immediate. PROCEDURE: Informed written consent was obtained from the patient after a thorough discussion of the procedural risks, benefits and alternatives. All questions were addressed. Maximal Sterile Barrier Technique was utilized including caps, mask, sterile gowns, sterile gloves, sterile drape, hand hygiene and skin antiseptic. A timeout was performed prior to the initiation of the procedure. The existing gallbladder drainage catheter was prepped and draped in a sterile fashion. The retention suture was still intact. It was difficult to flush  the catheter and the tube appeared to be partially obstructed. Purulent looking fluid was draining around the catheter during injection of the tube. The tube was found to be within the gallbladder. The retention suture was removed. The catheter was cut. The catheter was removed over a stiff Amplatz wire. A new 12 [place] multipurpose drain was easily advanced over the wire and reconstituted in the gallbladder. Approximately 40 mL of light brown purulent looking fluid was removed from the gallbladder. Small amount of contrast was injected through the gallbladder drain. Drain was flushed with normal saline. Skin was anesthetized with 1% lidocaine and sutured to skin with Prolene suture. FINDINGS: Old catheter was well positioned in the gallbladder but the tube appeared to be partially obstructed. Greater than 40 mL of brown purulent looking fluid was removed from the gallbladder after the drain exchange. Fluid was sent for culture. Cholangiogram demonstrated partial filling of the cystic duct but no filling of the common bile duct. Innumerable gallstones. IMPRESSION: 1. Successful exchange of the cholecystostomy tube. 2. Innumerable gallstones. Unable to opacify the common bile duct due to obstruction of the cystic duct. 3. Purulent looking fluid removed from the gallbladder. Fluid sent for  culture. Electronically Signed   By: Markus Daft M.D.   On: 01/25/2019 17:11      Assessment/Plan:  INTERVAL HISTORY: Patient seen by palliative care and family still desires aggressive care wife is at the bedside now.   Principal Problem:   Biliary sepsis Active Problems:   Chronic systolic CHF (congestive heart failure) (HCC)   Ventricular tachycardia (HCC)   Dementia (HCC)   Coronary artery disease involving native coronary artery of native heart without angina pectoris   Hyperglycemia   Acute metabolic encephalopathy   Transaminitis   Dementia without behavioral disturbance (HCC)   Goals of care, counseling/discussion   Palliative care by specialist    William [name] is a 85 y.o. male with recurrent episodes of cholangitis and sepsis at times with bacteremias including in time this fall when he had to have his ICD removed, again remitted admitted with cholangitis and sepsis in the context of an obstructed cholecystostomy tube, and concern for potential obstruction of his biliary tree due to stones.  #1 Cholangitis with sepsis: Enterobacter is been isolated from purulent material taken from exchange of his tube.  Sensitivities are pending.  He has justifiably not an appropriate candidate for cholecystectomy with general surgery.  I am concerned that if stones in the biliary tree cannot be removed this pattern of recurrent sepsis is just going to recur over and over again.  We can offer the patient a month of parenteral antibiotics but this is not going to solve this problem.  #2 Goals of care: I think the patient's trajectory is clearly not favorable looking over the next weeks and months.  We may be able to keep him out of the hospital by keeping him on parenteral antibiotics but doing that also exposes him to risks including frequent healthcare visits in the context of a novel coronavirus 2019 epidemic.  I have try to be as clear as possible with the wife that the  antibiotics alone are not going to solve this problem if there are stones in the biliary tree that continue to be a source of his bacteremias and if surgical and/or gastroenterology interventions are not going to be able to solve this problem I will not build to soften with antibiotics either.  Dr. Prince Rome will take over the service tomorrow.  LOS: 3 days   Alcide Evener 01/27/2019, 10:44 AM

## 2019-01-27 NOTE — Progress Notes (Signed)
PROGRESS NOTE    William [name]  XNA:[id number] DOB: August 09, 1928 DOA: 01/24/2019 PCP: Gayland Curry, DO   Brief Narrative:  85 y.o. WM PMHx Dementia, AAA, aortic stenosis status post TAVR, chronic systolic CHF pulmonary nodules, chronic systolic CHF, history of VT with AICD that was removed due to infection, cholecystitis and cholangitis with biliary drain in place, debility at SNF, now presenting to the emergency department for evaluation of lethargy, fevers, and vomiting.  Patient is unable to contribute much to the history due to his clinical condition.  Per report of the patient's daughter and nursing facility personnel, there has been decreasing output from the patient's biliary drain and he has developed nausea over the past couple days with very poor intake, progressing to lethargy today.  He was reportedly pale, slumped over, and vomiting some brownish material.  He was noted to have a fever to 100.7 F, tachycardic in the 130s, and blood pressure stable initially.  He has not been coughing and has not appeared dyspneic or requiring any oxygen per report.  He was sent into the ED for evaluation of his change in condition.   ED Course: Upon arrival to the ED, patient is found to be febrile to 38.2 C, saturating well on room air, tachycardic in the 120s, initial blood pressure 83 systolic, and normal respiratory rate.  EKG features a sinus tachycardia with chronic LBBB.  Chest x-ray is notable for patchy left basilar atelectasis versus early infiltrate.  Chemistry panel notable for sodium 133, glucose 255, alkaline phosphatase 595, AST and ALT in the 300 range, and total bilirubin of 4.  CBC features a leukocytosis to 11,100 and a slight thrombocytopenia.  Lactic acid is elevated to 2.7.  Blood cultures were collected in the ED, 250 cc normal saline bolus was given, and the patient was treated with vancomycin and Zosyn.  General surgery was consulted by the ED clinician, recommended consultation  with GI, ED PA reports that she is awaiting for callback from GI at this time and requests a medical admission.    Subjective: Patient pleasantly confused.  Denies any pain at this time.   Assessment & Plan:   Principal Problem:   Biliary sepsis Active Problems:   Chronic systolic CHF (congestive heart failure) (HCC)   Ventricular tachycardia (HCC)   Dementia (HCC)   Coronary artery disease involving native coronary artery of native heart without angina pectoris   Hyperglycemia   Acute metabolic encephalopathy   Transaminitis   Dementia without behavioral disturbance (HCC)   Goals of care, counseling/discussion   Palliative care by specialist   Severe sepsis - Upon admission patient met guidelines for sepsis temp> 30 C, HR> 90, RR> 20 and site of infection occluded biliary drain. -positive Enterobacter from cholecystostomy tube -Most likely secondary to occluded biliary drain.  However patient has been pancultured.  Cholecystostomy tube was exchanged on 4/13.  See below. - Continue broad-spectrum antibiotics per ID.  Currently on cefepime and metronidazole.  Sepsis physiology appears to have improved.  Cholelithiasis/occluded cholecystostomy tube - 4/13 cholecystostomy tube exchanged - Numerous stones removed along with purulent drainage which was sent for culture. - Cystic duct occluded see results below - Patient seen by general surgery who feels that he is a poor candidate for surgical intervention.  At the same time without cholecystectomy this may be a recurrent issue for this patient.  Family continues to have expectation that the patient will improve.  May need to discuss with them further.  At  the same time palliative medicine has also seen the patient.  There is a note from general surgery that further stratification from cardiology may be of benefit to decide definitely about surgical intervention.  We will wait to see what gastroenterology recommends.  And then may have  to consider involving cardiology.  Acute encephalopathy Patient pleasantly confused.  No focal neurological deficits.  He does tend to get agitated at times.   Chronic systolic CHF -EF 25 to 72% with diffuse hypokinesis, mild to moderate MR, TAVR -Echocardiogram February 2020: EF 25-30% with diffuse hypokinesis, mild-moderate MR, TAVR, negative endocarditis    CAD Minimal CAD was noted on cardiac catheterization done in September 2019. Chronic LBBB noted on EKG  OSA - CPAP per respiratory  History of ventricular tachycardia - Patient had AICD but was removed December 2019 due to infection.  Occasional episodes of nonsustained VT noted.  Potassium noted to be low this morning.  This will be repleted.  Magnesium is 2.1.   Hyperglycemia - Serum glucose in the ED 255 - Hemoglobin A1c 5.7 -LDL 100 - sensitive SSI  Dementia - Pleasantly confused.  Occasional agitation noted.  Improved when his wife is at bedside.    Goals of care Seen by palliative medicine.  Will scope of treatment to continue.  DVT prophylaxis: SCD Code Status: Full Family Communication: Spoke with Cephus Richer (daughter) discussed extensively patient's recent hospitalizations and current treatment plan. Disposition Plan: TBD   Consultants:  GI [facility] ID    Procedures/Significant Events:  4/12 PCXR: LEFT basilar atelectasis/early infiltrate 4/12 US abdomen RUQ:-Mild gallbladder wall thickening, probably chronic sequelae of cholecystitis and cholecystostomy tube.  -Cholelithiasis again noted -No intra-/extrahepatic biliary ductal dilation noted  4/13 cholecystostomy tube exchange with fluoroscopy:-Innumerable gallstones. Unable to opacify the common bile duct due to obstruction of the cystic duct. -Purulent looking fluid removed from the gallbladder. Fluid sent for culture.   Cultures 4/12 blood RIGHT antecubital NGTD 4/12 blood counts right hand NGTD  4/13 gallbladder positive Enterobacter     Antimicrobials: Anti-infectives (From admission, onward)   Start     Stop   01/25/19 2200  vancomycin (VANCOCIN) 1,500 mg in sodium chloride 0.9 % 500 mL IVPB  Status:  Discontinued     01/26/19 1124   01/25/19 0200  ceFEPIme (MAXIPIME) 2 g in sodium chloride 0.9 % 100 mL IVPB         01/24/19 2200  ceFEPIme (MAXIPIME) 2 g in sodium chloride 0.9 % 100 mL IVPB  Status:  Discontinued     01/24/19 2208   01/24/19 2200  metroNIDAZOLE (FLAGYL) IVPB 500 mg         01/24/19 1945  piperacillin-tazobactam (ZOSYN) IVPB 3.375 g     01/24/19 2017   01/24/19 1930  ceFEPIme (MAXIPIME) 2 g in sodium chloride 0.9 % 100 mL IVPB  Status:  Discontinued     01/24/19 2010   01/24/19 1930  metroNIDAZOLE (FLAGYL) IVPB 500 mg  Status:  Discontinued     01/24/19 1931   01/24/19 1930  vancomycin (VANCOCIN) IVPB 1000 mg/200 mL premix  Status:  Discontinued     01/24/19 1922   01/24/19 1930  vancomycin (VANCOCIN) 2,000 mg in sodium chloride 0.9 % 500 mL IVPB     01/25/19 0012       LINES / TUBES:  Cholecystostomy tube/13>>    Continuous Infusions:  sodium chloride 10 mL/hr at 01/25/19 0007   ceFEPime (MAXIPIME) IV 2 g (01/27/19 0140)   metronidazole 500  mg (01/27/19 3704)     Objective: Vitals:   01/27/19 0437 01/27/19 0500 01/27/19 0609 01/27/19 0753  BP:    120/75  Pulse:   95 84  Resp:   15 20  Temp: 97.8 F (36.6 C)   97.7 F (36.5 C)  TempSrc: Oral   Axillary  SpO2:   99% 100%  Weight:  89.9 kg    Height:        Intake/Output Summary (Last 24 hours) at 01/27/2019 1123 Last data filed at 01/27/2019 8889 Gross per 24 hour  Intake 10 ml  Output 1225 ml  Net -1215 ml   Filed Weights   01/24/19 2022 01/25/19 0131 01/27/19 0500  Weight: 92 kg 91.6 kg 89.9 kg    General appearance: Awake alert.  In no distress.  Pleasantly confused. Resp: Clear to auscultation bilaterally.  Normal effort Cardio: S1-S2 is normal regular.  No S3-S4.  No rubs murmurs or bruit GI: Abdomen is  soft.  Nontender nondistended.  Bowel sounds are present normal.  No masses organomegaly.  Cholecystostomy drain tube is noted. Extremities: No edema.  Full range of motion of lower extremities. Neurologic: Pleasantly confused.  No focal neurological deficits.    CBC: Recent Labs  Lab 01/24/19 1935 01/25/19 0305 01/26/19 0257 01/27/19 0425  WBC 11.1* 10.6* 7.7 5.6  NEUTROABS 10.0* 9.0*  --   --   HGB 12.8* 12.0* 11.8* 11.1*  HCT 36.6* 35.9* 35.9* 34.2*  MCV 89.9 89.8 91.8 92.9  PLT 146* 132* 123* 169*   Basic Metabolic Panel: Recent Labs  Lab 01/24/19 1935 01/25/19 0305 01/26/19 0257 01/27/19 0425  NA 133* 138 140 139  K 3.8 3.7 4.5 3.0*  CL 99 103 107 105  CO2 _0 GLUCOSE 255* 151* 115* 115*  BUN _1 CREATININE 1.11 1.19 1.15 1.00  CALCIUM 8.8* 8.5* 8.6* 8.4*  MG  --   --  2.2 2.1   GFR: Estimated Creatinine Clearance: 56.5 mL/min (by C-G formula based on SCr of 1 mg/dL). Liver Function Tests: Recent Labs  Lab 01/24/19 1935 01/25/19 0305 01/27/19 0425  AST 303* 252* 52*  ALT 267* 263* 119*  ALKPHOS 595* 549* 373*  BILITOT 4.0* 4.9* 1.7*  PROT 6.8 6.4* 6.1*  ALBUMIN 2.7* 2.5* 2.3*   Recent Labs  Lab 01/24/19 1935  LIPASE 51   Recent Labs  Lab 01/24/19 2317  AMMONIA 26   Coagulation Profile: Recent Labs  Lab 01/24/19 2218  INR 1.2   Cardiac Enzymes: Recent Labs  Lab 01/24/19 2154  TROPONINI 0.04*   HbA1C: Recent Labs    01/26/19 0257 01/27/19 0425  HGBA1C 5.9* 5.7*   CBG: Recent Labs  Lab 01/26/19 1644 01/26/19 2020 01/27/19 0017 01/27/19 0438 01/27/19 0754  GLUCAP 98 139* 111* 101* 107*   Lipid Profile: Recent Labs    01/26/19 0257 01/27/19 0425  CHOL 139 140  HDL 22* 22*  LDLCALC 101* 100*  TRIG 81 91  CHOLHDL 6.3 6.4   Thyroid Function Tests: Recent Labs    01/25/19 0305  TSH 1.043   Urine analysis:    Component Value Date/Time   COLORURINE AMBER (A) 01/24/2019 2138   APPEARANCEUR HAZY  (A) 01/24/2019 2138   LABSPEC 1.018 01/24/2019 2138   PHURINE 5.0 01/24/2019 2138   GLUCOSEU 50 (A) 01/24/2019 2138   HGBUR SMALL (A) 01/24/2019 2138   BILIRUBINUR SMALL (A) 01/24/2019 2138   KETONESUR NEGATIVE 01/24/2019 2138   PROTEINUR  NEGATIVE 01/24/2019 2138   NITRITE NEGATIVE 01/24/2019 2138   LEUKOCYTESUR LARGE (A) 01/24/2019 2138    Recent Results (from the past 240 hour(s))  Blood Culture (routine x 2)     Status: None (Preliminary result)   Collection Time: 01/24/19  7:15 PM  Result Value Ref Range Status   Specimen Description BLOOD RIGHT ANTECUBITAL  Final   Special Requests   Final    BOTTLES DRAWN AEROBIC AND ANAEROBIC Blood Culture results may not be optimal due to an inadequate volume of blood received in culture bottles   Culture   Final    NO GROWTH 3 DAYS Performed at [facility], [place] [address], [place], [place] [address]    Report Status PENDING  Incomplete  Blood Culture (routine x 2)     Status: None (Preliminary result)   Collection Time: 01/24/19  7:36 PM  Result Value Ref Range Status   Specimen Description BLOOD RIGHT HAND  Final   Special Requests   Final    BOTTLES DRAWN AEROBIC AND ANAEROBIC Blood Culture results may not be optimal due to an inadequate volume of blood received in culture bottles   Culture   Final    NO GROWTH 3 DAYS Performed at [facility], [place] [address], [place], [place] [address]    Report Status PENDING  Incomplete  Aerobic/Anaerobic Culture (surgical/deep wound)     Status: None (Preliminary result)   Collection Time: 01/25/19  2:57 PM  Result Value Ref Range Status   Specimen Description GALL BLADDER  Final   Special Requests NONE  Final   Gram Stain   Final    ABUNDANT WBC PRESENT, PREDOMINANTLY PMN MODERATE GRAM NEGATIVE RODS RARE GRAM POSITIVE COCCI    Culture   Final    ABUNDANT ENTEROBACTER SPECIES SUSCEPTIBILITIES TO FOLLOW Performed at [facility], [place] [address] [place], [place]  [address]    Report Status PENDING  Incomplete         Radiology Studies: Ir Exchange Biliary Drain  Result Date: 01/25/2019 INDICATION: 85 year old with biliary sepsis and history of a cholecystostomy tube. EXAM: CHOLECYSTOSTOMY TUBE EXCHANGE WITH FLUOROSCOPY CHOLANGIOGRAM THROUGH EXISTING CATHETER MEDICATIONS: None ANESTHESIA/SEDATION: None FLUOROSCOPY TIME:  Fluoroscopy Time: 1 minutes, 12 seconds, 9 mGy COMPLICATIONS: None immediate. PROCEDURE: Informed written consent was obtained from the patient after a thorough discussion of the procedural risks, benefits and alternatives. All questions were addressed. Maximal Sterile Barrier Technique was utilized including caps, mask, sterile gowns, sterile gloves, sterile drape, hand hygiene and skin antiseptic. A timeout was performed prior to the initiation of the procedure. The existing gallbladder drainage catheter was prepped and draped in a sterile fashion. The retention suture was still intact. It was difficult to flush the catheter and the tube appeared to be partially obstructed. Purulent looking fluid was draining around the catheter during injection of the tube. The tube was found to be within the gallbladder. The retention suture was removed. The catheter was cut. The catheter was removed over a stiff Amplatz wire. A new 12 [place] multipurpose drain was easily advanced over the wire and reconstituted in the gallbladder. Approximately 40 mL of light brown purulent looking fluid was removed from the gallbladder. Small amount of contrast was injected through the gallbladder drain. Drain was flushed with normal saline. Skin was anesthetized with 1% lidocaine and sutured to skin with Prolene suture. FINDINGS: Old catheter was well positioned in the gallbladder but the tube appeared to be partially obstructed.  Greater than 40 mL of brown purulent looking fluid was removed from the gallbladder after the drain exchange. Fluid was sent for culture.  Cholangiogram demonstrated partial filling of the cystic duct but no filling of the common bile duct. Innumerable gallstones. IMPRESSION: 1. Successful exchange of the cholecystostomy tube. 2. Innumerable gallstones. Unable to opacify the common bile duct due to obstruction of the cystic duct. 3. Purulent looking fluid removed from the gallbladder. Fluid sent for culture. Electronically Signed   By: Markus Daft M.D.   On: 01/25/2019 17:11        Scheduled Meds:  amiodarone  100 mg Oral Daily   insulin aspart  0-9 Units Subcutaneous Q4H   pantoprazole (PROTONIX) IV  40 mg Intravenous Q24H   sodium chloride flush  3 mL Intravenous Q12H   sodium chloride flush  3 mL Intravenous Q12H   sodium chloride flush  5 mL Intracatheter Q8H   Continuous Infusions:  sodium chloride 10 mL/hr at 01/25/19 0007   ceFEPime (MAXIPIME) IV 2 g (01/27/19 0140)   metronidazole 500 mg (01/27/19 9037)     LOS: 3 days     Bonnielee Haff, MD [facility]   If 7PM-7AM, please contact night-coverage [email or web address] Password [facility] 01/27/2019, 11:23 AM

## 2019-01-27 NOTE — Progress Notes (Signed)
Notified by cental telemetry pt. Had a 9 run of V-Tach. Pt asymptomatic, vital signs all within normal range. Wife at bedside. Doctor notified. Will continue to monitor.

## 2019-01-27 NOTE — Progress Notes (Signed)
Patient ID: William [name], male   DOB: [birth year], 85 y.o.   MRN: [id number]    Progress Note   Subjective  Wife at bedside , pt calm and less confused than yesterday  No abdominal lpain, no N/V   Culture from GB growing Enterobacter  blood Cultures- negative  Tbili down to 1.7   K 3.0    Objective   Vital signs in last 24 hours: Temp:  [97.7 F (36.5 C)-98.2 F (36.8 C)] 97.7 F (36.5 C) (04/15 0753) Pulse Rate:  [84-100] 84 (04/15 0753) Resp:  [15-21] 20 (04/15 0753) BP: (114-120)/(72-76) 120/75 (04/15 0753) SpO2:  [99 %-100 %] 100 % (04/15 0753) Weight:  [89.9 kg] 89.9 kg (04/15 0500)   General:    [name] in NAD- jaundiced  Heart:  Regular rate and rhythm; no murmurs Lungs: Respirations even and unlabored, lungs CTA bilaterally Abdomen:  Soft, nontender and nondistended. Normal bowel sounds.  Cholecystostomy tube in place with minimal cloudy greenish-brown effluent Extremities:  Without edema. Neurologic:  Alert and oriented x 2, less confused  Psych:  Cooperative. Normal mood and affect.  Intake/Output from previous day: 04/14 0701 - 04/15 0700 In: 10  Out: 1225 [Urine:1200; Drains:25] Intake/Output this shift: No intake/output data recorded.  Lab Results: Recent Labs    01/25/19 0305 01/26/19 0257 01/27/19 0425  WBC 10.6* 7.7 5.6  HGB 12.0* 11.8* 11.1*  HCT 35.9* 35.9* 34.2*  PLT 132* 123* 124*   BMET Recent Labs    01/25/19 0305 01/26/19 0257 01/27/19 0425  NA 138 140 139  K 3.7 4.5 3.0*  CL 103 107 105  CO2 23 24 22   GLUCOSE 151* 115* 115*  BUN 15 14 18   CREATININE 1.19 1.15 1.00  CALCIUM 8.5* 8.6* 8.4*   LFT Recent Labs    01/25/19 0305 01/27/19 0425  PROT 6.4* 6.1*  ALBUMIN 2.5* 2.3*  AST 252* 52*  ALT 263* 119*  ALKPHOS 549* 373*  BILITOT 4.9* 1.7*  BILIDIR 3.0*  --    PT/INR Recent Labs    01/24/19 2218  LABPROT 14.9  INR 1.2    Studies/Results: Ir Exchange Biliary Drain  Result Date: 01/25/2019 INDICATION:  85 year old with biliary sepsis and history of a cholecystostomy tube. EXAM: CHOLECYSTOSTOMY TUBE EXCHANGE WITH FLUOROSCOPY CHOLANGIOGRAM THROUGH EXISTING CATHETER MEDICATIONS: None ANESTHESIA/SEDATION: None FLUOROSCOPY TIME:  Fluoroscopy Time: 1 minutes, 12 seconds, 9 mGy COMPLICATIONS: None immediate. PROCEDURE: Informed written consent was obtained from the patient after a thorough discussion of the procedural risks, benefits and alternatives. All questions were addressed. Maximal Sterile Barrier Technique was utilized including caps, mask, sterile gowns, sterile gloves, sterile drape, hand hygiene and skin antiseptic. A timeout was performed prior to the initiation of the procedure. The existing gallbladder drainage catheter was prepped and draped in a sterile fashion. The retention suture was still intact. It was difficult to flush the catheter and the tube appeared to be partially obstructed. Purulent looking fluid was draining around the catheter during injection of the tube. The tube was found to be within the gallbladder. The retention suture was removed. The catheter was cut. The catheter was removed over a stiff Amplatz wire. A new 12 [place] multipurpose drain was easily advanced over the wire and reconstituted in the gallbladder. Approximately 40 mL of light brown purulent looking fluid was removed from the gallbladder. Small amount of contrast was injected through the gallbladder drain. Drain was flushed with normal saline. Skin was anesthetized with 1% lidocaine and sutured to skin  with Prolene suture. FINDINGS: Old catheter was well positioned in the gallbladder but the tube appeared to be partially obstructed. Greater than 40 mL of brown purulent looking fluid was removed from the gallbladder after the drain exchange. Fluid was sent for culture. Cholangiogram demonstrated partial filling of the cystic duct but no filling of the common bile duct. Innumerable gallstones. IMPRESSION: 1. Successful  exchange of the cholecystostomy tube. 2. Innumerable gallstones. Unable to opacify the common bile duct due to obstruction of the cystic duct. 3. Purulent looking fluid removed from the gallbladder. Fluid sent for culture. Electronically Signed   By: Markus Daft M.D.   On: 01/25/2019 17:11       Assessment / Plan:    #58  85 year old white male with acute cholecystitis February 0034 complicated by sepsis and respiratory failure requiring vent support.  Patient underwent eventual cholecystostomy tube drainage and eventually he returned to skilled nursing facility with biliary drain in place. Now readmitted with lethargy, fever nausea leukocytosis and new LFT elevation.  There was initial concern for possible choledocholithiasis, however patient underwent a drain injection on 01/25/2019 and was found to have an obstructed drain, and pus in the gallbladder.  He also was noted to have persistent cystic duct obstruction  Culture from the gallbladder growing Enterobacter Patient had Enterobacter bacteremia with last admission.  Blood cultures currently negative.  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  #2 dementia #3 cardiomyopathy with EF 25 to 30% #4 chronic antiplatelet therapy-on Plavix currently on hold 5.  Status post aortic valve replacement 6.  Adult onset diabetes 7-goals of care-palliative care consult yesterday, family wishes to continue with aggressive management.   Plan; Continue broad-spectrum antibiotics We will proceed with MRI/MRCP today Patient's wife was updated.               Principal Problem:   Biliary sepsis Active Problems:   Chronic systolic  CHF (congestive heart failure) (HCC)   Ventricular tachycardia (HCC)   Dementia (HCC)   Coronary artery disease involving native coronary artery of native heart without angina pectoris   Hyperglycemia   Acute metabolic encephalopathy   Transaminitis   Dementia without behavioral disturbance ([place])   Goals of care, counseling/discussion   Palliative care by specialist     LOS: 3 days   Gerrie Castiglia  01/27/2019, 11:10 AM

## 2019-01-28 ENCOUNTER — Inpatient Hospital Stay (HOSPITAL_COMMUNITY): Payer: Medicare Other

## 2019-01-28 ENCOUNTER — Encounter (HOSPITAL_COMMUNITY): Payer: Self-pay | Admitting: Diagnostic Radiology

## 2019-01-28 DIAGNOSIS — K8050 Calculus of bile duct without cholangitis or cholecystitis without obstruction: Secondary | ICD-10-CM

## 2019-01-28 DIAGNOSIS — E785 Hyperlipidemia, unspecified: Secondary | ICD-10-CM

## 2019-01-28 DIAGNOSIS — K819 Cholecystitis, unspecified: Secondary | ICD-10-CM

## 2019-01-28 DIAGNOSIS — I447 Left bundle-branch block, unspecified: Secondary | ICD-10-CM

## 2019-01-28 DIAGNOSIS — I5043 Acute on chronic combined systolic (congestive) and diastolic (congestive) heart failure: Secondary | ICD-10-CM

## 2019-01-28 DIAGNOSIS — M47816 Spondylosis without myelopathy or radiculopathy, lumbar region: Secondary | ICD-10-CM

## 2019-01-28 DIAGNOSIS — I428 Other cardiomyopathies: Secondary | ICD-10-CM

## 2019-01-28 DIAGNOSIS — I350 Nonrheumatic aortic (valve) stenosis: Secondary | ICD-10-CM

## 2019-01-28 HISTORY — PX: IR SINUS/FIST TUBE CHK-NON GI: IMG673

## 2019-01-28 LAB — COMPREHENSIVE METABOLIC PANEL
ALT: 83 U/L — ABNORMAL HIGH (ref 0–44)
AST: 33 U/L (ref 15–41)
Albumin: 2.3 g/dL — ABNORMAL LOW (ref 3.5–5.0)
Alkaline Phosphatase: 365 U/L — ABNORMAL HIGH (ref 38–126)
Anion gap: 9 (ref 5–15)
BUN: 13 mg/dL (ref 8–23)
CO2: 20 mmol/L — ABNORMAL LOW (ref 22–32)
Calcium: 8.1 mg/dL — ABNORMAL LOW (ref 8.9–10.3)
Chloride: 105 mmol/L (ref 98–111)
Creatinine, Ser: 0.89 mg/dL (ref 0.61–1.24)
GFR calc Af Amer: >60 mL/min (ref >60)
GFR calc non Af Amer: >60 mL/min (ref >60)
Glucose, Bld: 118 mg/dL — ABNORMAL HIGH (ref 70–99)
Potassium: 3.3 mmol/L — ABNORMAL LOW (ref 3.5–5.1)
Sodium: 134 mmol/L — ABNORMAL LOW (ref 135–145)
Total Bilirubin: 1.4 mg/dL — ABNORMAL HIGH (ref 0.3–1.2)
Total Protein: 6.1 g/dL — ABNORMAL LOW (ref 6.5–8.1)

## 2019-01-28 LAB — CBC
HCT: 35.4 % — ABNORMAL LOW (ref 39.0–52.0)
Hemoglobin: 11.6 g/dL — ABNORMAL LOW (ref 13.0–17.0)
MCH: 30 pg (ref 26.0–34.0)
MCHC: 32.8 g/dL (ref 30.0–36.0)
MCV: 91.5 fL (ref 80.0–100.0)
Platelets: 131 10*3/uL — ABNORMAL LOW (ref 150–400)
RBC: 3.87 MIL/uL — ABNORMAL LOW (ref 4.22–5.81)
RDW: 15.6 % — ABNORMAL HIGH (ref 11.5–15.5)
WBC: 5.7 10*3/uL (ref 4.0–10.5)
nRBC: 0 % (ref 0.0–0.2)

## 2019-01-28 LAB — GLUCOSE, CAPILLARY
Glucose-Capillary: 100 mg/dL — ABNORMAL HIGH (ref 70–99)
Glucose-Capillary: 118 mg/dL — ABNORMAL HIGH (ref 70–99)
Glucose-Capillary: 130 mg/dL — ABNORMAL HIGH (ref 70–99)
Glucose-Capillary: 134 mg/dL — ABNORMAL HIGH (ref 70–99)
Glucose-Capillary: 179 mg/dL — ABNORMAL HIGH (ref 70–99)

## 2019-01-28 LAB — MAGNESIUM: Magnesium: 2 mg/dL (ref 1.7–2.4)

## 2019-01-28 MED ORDER — POTASSIUM CHLORIDE CRYS ER 20 MEQ PO TBCR
40.0000 meq | EXTENDED_RELEASE_TABLET | Freq: Once | ORAL | Status: AC
  Administered 2019-01-28: 21:00:00 40 meq via ORAL
  Filled 2019-01-28: qty 2

## 2019-01-28 MED ORDER — LIDOCAINE HCL 1 % IJ SOLN
INTRAMUSCULAR | Status: AC
  Filled 2019-01-28: qty 20

## 2019-01-28 MED ORDER — IOHEXOL 300 MG/ML  SOLN: 10.0000 mL | Freq: Once | INTRAMUSCULAR | Status: DC | PRN

## 2019-01-28 MED ORDER — POTASSIUM CHLORIDE CRYS ER 20 MEQ PO TBCR
40.0000 meq | EXTENDED_RELEASE_TABLET | Freq: Once | ORAL | Status: AC
  Administered 2019-01-28: 10:00:00 40 meq via ORAL
  Filled 2019-01-28: qty 2

## 2019-01-28 MED ORDER — SODIUM CHLORIDE 0.9 % IV SOLN
2.0000 g | Freq: Three times a day (TID) | INTRAVENOUS | Status: DC
  Administered 2019-01-28 – 2019-02-01 (×12): 2 g via INTRAVENOUS
  Filled 2019-01-28 (×16): qty 2

## 2019-01-28 NOTE — Progress Notes (Signed)
This RN (unassigned to patient) walked by patient's room at approximately 0300 and observed patient scratching pulling at right side of abdomen. This RN aware per previous patient assignment that patient had biliary drain. Notified assigned RN and suggested putting mitts on patient. Later observed patient still scratching and pulling; entered patient's room and removed patient's hand from drain site. Patient became agitated and called for RN assistance. Charge RN arrived to bedside to assist; placed mitts on patient and observed drain site dressing had been removed and site had been tampered with by patient. Charge RN placed temporary dressing to cover site.

## 2019-01-28 NOTE — Progress Notes (Signed)
Patient removed CPAP mask and placed on the bedside table. Pt. refused to put back on.

## 2019-01-28 NOTE — H&P (View-Only) (Signed)
Patient ID: William [name], male   DOB: [birth year], 85 y.o.   MRN: [id number]    Progress Note   Subjective   Pt alert, cooperative, wife at bedside  Agitated during night and pulled on biliary drain -sutures pulled  MRCP - multiple CBD stones  Duct dilated to 13 mm, no intrahepatic dilation,. GB full of stones, cholecystostomy tube in place in GB   Objective   Vital signs in last 24 hours: Temp:  [97.5 F (36.4 C)-98.6 F (37 C)] 98.2 F (36.8 C) (04/16 0425) Pulse Rate:  [79-87] 80 (04/16 0446) Resp:  [10-18] 10 (04/16 0446) BP: (101-122)/(71-80) 108/74 (04/16 0446) SpO2:  [96 %-100 %] 99 % (04/16 0446) Weight:  [92.5 kg] 92.5 kg (04/16 0425)   General:    [name] in NAD, confused , less jaundiced Heart:  Regular rate and rhythm; no murmurs Lungs: Respirations even and unlabored, lungs CTA bilaterally Abdomen:  Soft, nontender and nondistended. Normal bowel sounds.Biliary drain in place - minimal output , brown/cloudy Extremities:  Without edema. Neurologic:  Alert and oriented,  grossly normal neurologically. Psych:  Cooperative. Normal mood and affect.  Intake/Output from previous day: 04/15 0701 - 04/16 0700 In: 230 [I.V.:10; IV Piggyback:200] Out: 1610 [Urine:1600; Drains:10] Intake/Output this shift: No intake/output data recorded.  Lab Results: Recent Labs    01/26/19 0257 01/27/19 0425 01/28/19 0347  WBC 7.7 5.6 5.7  HGB 11.8* 11.1* 11.6*  HCT 35.9* 34.2* 35.4*  PLT 123* 124* 131*   BMET Recent Labs    01/26/19 0257 01/27/19 0425 01/28/19 0347  NA 140 139 134*  K 4.5 3.0* 3.3*  CL 107 105 105  CO2 24 22 20*  GLUCOSE 115* 115* 118*  BUN 14 18 13   CREATININE 1.15 1.00 0.89  CALCIUM 8.6* 8.4* 8.1*   LFT Recent Labs    01/28/19 0347  PROT 6.1*  ALBUMIN 2.3*  AST 33  ALT 83*  ALKPHOS 365*  BILITOT 1.4*   PT/INR No results for input(s): LABPROT, INR in the last 72 hours.  Studies/Results: Mr 3d Recon At Scanner  Result Date:  01/28/2019 CLINICAL DATA:  85 year old male with history of acute cholecystitis in February 3710, complicated by sepsis and respiratory failure. Status post cholecystostomy drainage. Readmission for lethargy, fever, nausea and leukocytosis with newly increased liver function tests. EXAM: MRI ABDOMEN WITHOUT AND WITH CONTRAST (INCLUDING MRCP) TECHNIQUE: Multiplanar multisequence MR imaging of the abdomen was performed both before and after the administration of intravenous contrast. Heavily T2-weighted images of the biliary and pancreatic ducts were obtained, and three-dimensional MRCP images were rendered by post processing. CONTRAST:  9 mL of Gadavist. COMPARISON:  No prior abdominal MRI. CT the abdomen and pelvis 11/21/2018. FINDINGS: Comment: Today's study is limited by considerable patient motion. Lower chest: Small bilateral pleural effusions lying dependently. Signal void in the region of the aortic root related to prior TAVR procedure. Hepatobiliary: No discrete cystic or solid hepatic lesions are confidently identified on today's motion limited examination. MRCP images are largely nondiagnostic. However, other T2 weighted sequences demonstrate no definite intrahepatic biliary ductal dilatation. A percutaneous cholecystostomy tube is noted. Multiple filling defects within the gallbladder are compatible with multiple gallstones. Gallbladder does not appear overly distended, and there is no large volume of pericholecystic fluid noted at this time. A trace amount of irregular T2 signal intensity is noted adjacent to the gallbladder, likely within normal limits given the indwelling percutaneous cholecystostomy tube. Innumerable filling defects are also noted within the common  bile duct, compatible with choledocholithiasis. Common bile duct appears to measure up to 13 mm distally. Pancreas: Pancreatic atrophy. Multiple well-defined T1 hypointense, T2 hyperintense, nonenhancing lesions are noted throughout the body  of the pancreas, measuring up to 1.4 cm (axial image 25 of series 4). No pancreatic ductal dilatation. No peripancreatic fluid collections or inflammatory changes. Spleen:  Unremarkable. Adrenals/Urinary Tract: There are multiple T1 hypointense, T2 hyperintense, nonenhancing lesions in the kidneys bilaterally, compatible with simple cysts, largest of which is exophytic in the posterior aspect of the upper pole of the right kidney measuring 5 cm. No hydroureteronephrosis in the visualized portions of the abdomen. Bilateral adrenal glands are normal in appearance. Stomach/Bowel: Visualized portions are unremarkable. Vascular/Lymphatic: Aortic atherosclerosis with fusiform aneurysmal dilatation of the infrarenal abdominal aorta which measures up to 4.2 x 5.2 cm. No lymphadenopathy identified in the abdomen. Other: No significant volume of ascites noted in the visualized portions of the peritoneal cavity. Musculoskeletal: No aggressive appearing lytic or blastic lesions noted in the visualized portions of the skeleton. IMPRESSION: 1. Choledocholithiasis with dilated common bile duct measuring up to 13 mm in the porta hepatis. Despite the presence of numerous stones in this mildly dilated duct, there is no intrahepatic biliary ductal dilatation noted at this time. 2. Cholelithiasis with percutaneous cholecystostomy tube in place. Minimal surrounding T2 hyperintensity adjacent to the gallbladder which is likely within normal limits for a patient with an indwelling percutaneous cholecystostomy tube. 3. Aortic atherosclerosis with fusiform dilatation of the infrarenal abdominal aorta. 4. Small bilateral pleural effusions lying dependently. Electronically Signed   By: Vinnie Langton M.D.   On: 01/28/2019 08:04   Mr Abdomen Mrcp Moise Boring Contast  Result Date: 01/28/2019 CLINICAL DATA:  85 year old male with history of acute cholecystitis in February 1829, complicated by sepsis and respiratory failure. Status post  cholecystostomy drainage. Readmission for lethargy, fever, nausea and leukocytosis with newly increased liver function tests. EXAM: MRI ABDOMEN WITHOUT AND WITH CONTRAST (INCLUDING MRCP) TECHNIQUE: Multiplanar multisequence MR imaging of the abdomen was performed both before and after the administration of intravenous contrast. Heavily T2-weighted images of the biliary and pancreatic ducts were obtained, and three-dimensional MRCP images were rendered by post processing. CONTRAST:  9 mL of Gadavist. COMPARISON:  No prior abdominal MRI. CT the abdomen and pelvis 11/21/2018. FINDINGS: Comment: Today's study is limited by considerable patient motion. Lower chest: Small bilateral pleural effusions lying dependently. Signal void in the region of the aortic root related to prior TAVR procedure. Hepatobiliary: No discrete cystic or solid hepatic lesions are confidently identified on today's motion limited examination. MRCP images are largely nondiagnostic. However, other T2 weighted sequences demonstrate no definite intrahepatic biliary ductal dilatation. A percutaneous cholecystostomy tube is noted. Multiple filling defects within the gallbladder are compatible with multiple gallstones. Gallbladder does not appear overly distended, and there is no large volume of pericholecystic fluid noted at this time. A trace amount of irregular T2 signal intensity is noted adjacent to the gallbladder, likely within normal limits given the indwelling percutaneous cholecystostomy tube. Innumerable filling defects are also noted within the common bile duct, compatible with choledocholithiasis. Common bile duct appears to measure up to 13 mm distally. Pancreas: Pancreatic atrophy. Multiple well-defined T1 hypointense, T2 hyperintense, nonenhancing lesions are noted throughout the body of the pancreas, measuring up to 1.4 cm (axial image 25 of series 4). No pancreatic ductal dilatation. No peripancreatic fluid collections or inflammatory  changes. Spleen:  Unremarkable. Adrenals/Urinary Tract: There are multiple T1 hypointense, T2 hyperintense, nonenhancing  lesions in the kidneys bilaterally, compatible with simple cysts, largest of which is exophytic in the posterior aspect of the upper pole of the right kidney measuring 5 cm. No hydroureteronephrosis in the visualized portions of the abdomen. Bilateral adrenal glands are normal in appearance. Stomach/Bowel: Visualized portions are unremarkable. Vascular/Lymphatic: Aortic atherosclerosis with fusiform aneurysmal dilatation of the infrarenal abdominal aorta which measures up to 4.2 x 5.2 cm. No lymphadenopathy identified in the abdomen. Other: No significant volume of ascites noted in the visualized portions of the peritoneal cavity. Musculoskeletal: No aggressive appearing lytic or blastic lesions noted in the visualized portions of the skeleton. IMPRESSION: 1. Choledocholithiasis with dilated common bile duct measuring up to 13 mm in the porta hepatis. Despite the presence of numerous stones in this mildly dilated duct, there is no intrahepatic biliary ductal dilatation noted at this time. 2. Cholelithiasis with percutaneous cholecystostomy tube in place. Minimal surrounding T2 hyperintensity adjacent to the gallbladder which is likely within normal limits for a patient with an indwelling percutaneous cholecystostomy tube. 3. Aortic atherosclerosis with fusiform dilatation of the infrarenal abdominal aorta. 4. Small bilateral pleural effusions lying dependently. Electronically Signed   By: Vinnie Langton M.D.   On: 01/28/2019 08:04       Assessment / Plan:    #1 85 yo WM with recurrent biliary sepsis, admitted 4/12 with lethargy, nausea/vomiting, fever and jaundice Hx of severe sepsis 2/20 secondary to acute cholcystitis Previously placed Cholecystostomy tube found to be clogged, pus in Gb, cystic duct obstructed. Drain replaced- Cultures + Enterobacter  On Maxipime and Flagyl BC  negative  LFT's trending down   MRCP yesterday + multiple CBD stones,Multiple gallstones, pancreatic cysts  Long discussion today with daughter and wife . Discussed findings and option for ERCP and removal of stones to help prevent recurrent sepsis, and repeat hospitalizations. Discussed risk of pancreatitis, perforations, bleeding , and failure of procedure . They wish to proceed.  Discussed significant risk of complications with anesthesia. Family asking about avoidance of general anesthesia because of his dementia- explained very unlikely that anesthesia to agree, and likely safer from procedure  Standpoint  Family also requesting surgery be re- involved to consider cholecystectomy this admit   #2 Dementia  #3 s/p AVR replacement #4 CAD - off plavix sine 4/12 #5 EF 20 -25 %   #6 AODM  Plan;  ERCP with sphincterotomy and stone extraction has been scheduled with Dr. Oretha Caprice for tomorrow morning. Continue Maxipime and metronidazole.  Continue to hold Plavix  Please reconsult surgery, family specifically requesting Dr. Greer Pickerel who had seen her during last admission.  Patient will go to radiology for study again this morning  And re- suturing             Principal Problem:   Biliary sepsis Active Problems:   Chronic systolic CHF (congestive heart failure) (HCC)   Ventricular tachycardia (HCC)   Dementia (HCC)   Coronary artery disease involving native coronary artery of native heart without angina pectoris   Hyperglycemia   Acute metabolic encephalopathy   Transaminitis   Dementia without behavioral disturbance ([place])   Goals of care, counseling/discussion   Palliative care by specialist     LOS: 4 days   Austin Pongratz EsterwoodPA-C  01/28/2019, 9:11 AM

## 2019-01-28 NOTE — Progress Notes (Signed)
PROGRESS NOTE    William [name]  TSV:[id number] DOB: 03-27-1928 DOA: 01/24/2019 PCP: Gayland Curry, DO   Brief Narrative:  85 y.o. WM PMHx Dementia, AAA, aortic stenosis status post TAVR, chronic systolic CHF pulmonary nodules, chronic systolic CHF, history of VT with AICD that was removed due to infection, cholecystitis and cholangitis with biliary drain in place, debility at SNF, now presenting to the emergency department for evaluation of lethargy, fevers, and vomiting.  Patient is unable to contribute much to the history due to his clinical condition.  Per report of the patient's daughter and nursing facility personnel, there has been decreasing output from the patient's biliary drain and he has developed nausea over the past couple days with very poor intake, progressing to lethargy today.  He was reportedly pale, slumped over, and vomiting some brownish material.  He was noted to have a fever to 100.7 F, tachycardic in the 130s, and blood pressure stable initially.  He has not been coughing and has not appeared dyspneic or requiring any oxygen per report.  He was sent into the ED for evaluation of his change in condition.   ED Course: Upon arrival to the ED, patient is found to be febrile to 38.2 C, saturating well on room air, tachycardic in the 120s, initial blood pressure 83 systolic, and normal respiratory rate.  EKG features a sinus tachycardia with chronic LBBB.  Chest x-ray is notable for patchy left basilar atelectasis versus early infiltrate.  Chemistry panel notable for sodium 133, glucose 255, alkaline phosphatase 595, AST and ALT in the 300 range, and total bilirubin of 4.  CBC features a leukocytosis to 11,100 and a slight thrombocytopenia.  Lactic acid is elevated to 2.7.  Blood cultures were collected in the ED, 250 cc normal saline bolus was given, and the patient was treated with vancomycin and Zosyn.  General surgery was consulted by the ED clinician, recommended consultation  with GI, ED PA reports that she is awaiting for callback from GI at this time and requests a medical admission.    Subjective: Patient noted to be asleep when evaluated this morning.  He was noted to have mittens on his hands.  May have become agitated over the course of the night.  Noted to be confused this morning which is his baseline.    Assessment & Plan:   Principal Problem:   Biliary sepsis Active Problems:   Chronic systolic CHF (congestive heart failure) (HCC)   Ventricular tachycardia (HCC)   Dementia (HCC)   Coronary artery disease involving native coronary artery of native heart without angina pectoris   Hyperglycemia   Acute metabolic encephalopathy   Transaminitis   Dementia without behavioral disturbance (HCC)   Goals of care, counseling/discussion   Palliative care by specialist   Severe sepsis - Upon admission patient met guidelines for sepsis temp> 30 C, HR> 90, RR> 20 and site of infection occluded biliary drain. -positive Enterobacter from cholecystostomy tube -Most likely secondary to occluded biliary drain.  Cholecystostomy tube was exchanged on 4/13.  See below. - Continue broad-spectrum antibiotics per ID.  Currently on cefepime and metronidazole.  Sepsis physiology appears to have improved.  Cholelithiasis/occluded cholecystostomy tube - 4/13 cholecystostomy tube exchanged - Numerous stones removed along with purulent drainage which was sent for culture. - Cystic duct occluded see results below - Patient seen by general surgery who feels that he may be a poor candidate for surgical intervention.  They did recommend consulting cardiology for better risk stratification.  Discussed with infectious disease yesterday who feels that without cholecystectomy patient may continue to have recurrent infection and sepsis.  Family continues to have expectation that the patient will improve.  Patient was also seen by palliative medicine.  At this time it may be  reasonable to get an opinion from cardiology regarding his surgical risk and that may give [place] more information to discuss further with the family members.  -Patient underwent MRCP yesterday.  Results reviewed and appears to show choledocholithiasis.  Await further GI input.   Acute encephalopathy Patient pleasantly confused.  No focal neurological deficits.  He does tend to get agitated at times.   Chronic systolic CHF -EF 25 to 53% with diffuse hypokinesis, mild to moderate MR, TAVR -Echocardiogram February 2020: EF 25-30% with diffuse hypokinesis, mild-moderate MR, TAVR, negative endocarditis    CAD Minimal CAD was noted on cardiac catheterization done in September 2019. Chronic LBBB noted on EKG  OSA - CPAP per respiratory  History of ventricular tachycardia - Patient had AICD but was removed December 2019 due to infection.  Occasional episodes of nonsustained VT noted.  Magnesium is 2.0.  Potassium 3.3 today.  Will be repleted.     Hyperglycemia - Serum glucose in the ED 255.  CBGs have been reasonably well controlled subsequently. - Hemoglobin A1c 5.7 -LDL 100 - sensitive SSI  Dementia - Pleasantly confused.  Occasional agitation noted.  Improved when his wife is at bedside.    Goals of care Seen by palliative medicine.  Full scope of treatment to continue.  DVT prophylaxis: SCD Code Status: Full Family Communication: Tried calling patient's daughter without success today.  Will reattempt later today.   Disposition Plan: Unclear for now.  Possibly may need to go to skilled nursing facility when medically ready for discharge.   Consultants:  GI CCS ID    Procedures/Significant Events:  4/12 PCXR: LEFT basilar atelectasis/early infiltrate 4/12 US abdomen RUQ:-Mild gallbladder wall thickening, probably chronic sequelae of cholecystitis and cholecystostomy tube.  -Cholelithiasis again noted -No intra-/extrahepatic biliary ductal dilation noted  4/13 cholecystostomy  tube exchange with fluoroscopy:-Innumerable gallstones. Unable to opacify the common bile duct due to obstruction of the cystic duct. -Purulent looking fluid removed from the gallbladder. Fluid sent for culture.   Cultures 4/12 blood RIGHT antecubital NGTD 4/12 blood counts right hand NGTD  4/13 gallbladder positive Enterobacter    Antimicrobials: Anti-infectives (From admission, onward)   Start     Stop   01/25/19 2200  vancomycin (VANCOCIN) 1,500 mg in sodium chloride 0.9 % 500 mL IVPB  Status:  Discontinued     01/26/19 1124   01/25/19 0200  ceFEPIme (MAXIPIME) 2 g in sodium chloride 0.9 % 100 mL IVPB         01/24/19 2200  ceFEPIme (MAXIPIME) 2 g in sodium chloride 0.9 % 100 mL IVPB  Status:  Discontinued     01/24/19 2208   01/24/19 2200  metroNIDAZOLE (FLAGYL) IVPB 500 mg         01/24/19 1945  piperacillin-tazobactam (ZOSYN) IVPB 3.375 g     01/24/19 2017   01/24/19 1930  ceFEPIme (MAXIPIME) 2 g in sodium chloride 0.9 % 100 mL IVPB  Status:  Discontinued     01/24/19 2010   01/24/19 1930  metroNIDAZOLE (FLAGYL) IVPB 500 mg  Status:  Discontinued     01/24/19 1931   01/24/19 1930  vancomycin (VANCOCIN) IVPB 1000 mg/200 mL premix  Status:  Discontinued     01/24/19 1922  01/24/19 1930  vancomycin (VANCOCIN) 2,000 mg in sodium chloride 0.9 % 500 mL IVPB     01/25/19 0012       LINES / TUBES:  Cholecystostomy tube/13>>    Continuous Infusions:  sodium chloride 10 mL/hr at 01/25/19 0007   ceFEPime (MAXIPIME) IV     metronidazole 500 mg (01/28/19 0953)     Objective: Vitals:   01/27/19 2129 01/28/19 0047 01/28/19 0425 01/28/19 0446  BP: 101/80 113/73  108/74  Pulse: 82 80  80  Resp: '16 18  10  ' Temp:  98.4 F (36.9 C) 98.2 F (36.8 C)   TempSrc:  Oral    SpO2: 97% 96%  99%  Weight:   92.5 kg   Height:        Intake/Output Summary (Last 24 hours) at 01/28/2019 1022 Last data filed at 01/28/2019 0500 Gross per 24 hour  Intake 230 ml  Output 1610 ml   Net -1380 ml   Filed Weights   01/25/19 0131 01/27/19 0500 01/28/19 0425  Weight: 91.6 kg 89.9 kg 92.5 kg   General appearance: Asleep this morning.  Easily arousable.  Confused. Resp: Clear to auscultation bilaterally.  Normal effort Cardio: S1-S2 is normal regular.  No S3-S4.  No rubs murmurs or bruit GI: Abdomen is soft.  Nontender nondistended.  Bowel sounds are present normal.  No masses organomegaly.  Biliary drain tube is noted on the right. Extremities: No edema.  Full range of motion of lower extremities. Neurologic: Pleasantly confused.  Moving all his extremities.   CBC: Recent Labs  Lab 01/24/19 1935 01/25/19 0305 01/26/19 0257 01/27/19 0425 01/28/19 0347  WBC 11.1* 10.6* 7.7 5.6 5.7  NEUTROABS 10.0* 9.0*  --   --   --   HGB 12.8* 12.0* 11.8* 11.1* 11.6*  HCT 36.6* 35.9* 35.9* 34.2* 35.4*  MCV 89.9 89.8 91.8 92.9 91.5  PLT 146* 132* 123* 124* 010*   Basic Metabolic Panel: Recent Labs  Lab 01/24/19 1935 01/25/19 0305 01/26/19 0257 01/27/19 0425 01/28/19 0347  NA 133* 138 140 139 134*  K 3.8 3.7 4.5 3.0* 3.3*  CL 99 103 107 105 105  CO2 '22 23 24 22 ' 20*  GLUCOSE 255* 151* 115* 115* 118*  BUN '16 15 14 18 13  ' CREATININE 1.11 1.19 1.15 1.00 0.89  CALCIUM 8.8* 8.5* 8.6* 8.4* 8.1*  MG  --   --  2.2 2.1 2.0   GFR: Estimated Creatinine Clearance: 64.3 mL/min (by C-G formula based on SCr of 0.89 mg/dL). Liver Function Tests: Recent Labs  Lab 01/24/19 1935 01/25/19 0305 01/27/19 0425 01/28/19 0347  AST 303* 252* 52* 33  ALT 267* 263* 119* 83*  ALKPHOS 595* 549* 373* 365*  BILITOT 4.0* 4.9* 1.7* 1.4*  PROT 6.8 6.4* 6.1* 6.1*  ALBUMIN 2.7* 2.5* 2.3* 2.3*   Recent Labs  Lab 01/24/19 1935  LIPASE 51   Recent Labs  Lab 01/24/19 2317  AMMONIA 26   Coagulation Profile: Recent Labs  Lab 01/24/19 2218  INR 1.2   Cardiac Enzymes: Recent Labs  Lab 01/24/19 2154  TROPONINI 0.04*   HbA1C: Recent Labs    01/26/19 0257 01/27/19 0425  HGBA1C  5.9* 5.7*   CBG: Recent Labs  Lab 01/27/19 1624 01/27/19 2006 01/28/19 0020 01/28/19 0420 01/28/19 0800  GLUCAP 92 122* 118* 100* 130*   Lipid Profile: Recent Labs    01/26/19 0257 01/27/19 0425  CHOL 139 140  HDL 22* 22*  LDLCALC 101* 100*  TRIG 81  91  CHOLHDL 6.3 6.4   Urine analysis:    Component Value Date/Time   COLORURINE AMBER (A) 01/24/2019 2138   APPEARANCEUR HAZY (A) 01/24/2019 2138   LABSPEC 1.018 01/24/2019 2138   PHURINE 5.0 01/24/2019 2138   GLUCOSEU 50 (A) 01/24/2019 2138   HGBUR SMALL (A) 01/24/2019 2138   BILIRUBINUR SMALL (A) 01/24/2019 2138   KETONESUR NEGATIVE 01/24/2019 2138   PROTEINUR NEGATIVE 01/24/2019 2138   NITRITE NEGATIVE 01/24/2019 2138   LEUKOCYTESUR LARGE (A) 01/24/2019 2138    Recent Results (from the past 240 hour(s))  Blood Culture (routine x 2)     Status: None (Preliminary result)   Collection Time: 01/24/19  7:15 PM  Result Value Ref Range Status   Specimen Description BLOOD RIGHT ANTECUBITAL  Final   Special Requests   Final    BOTTLES DRAWN AEROBIC AND ANAEROBIC Blood Culture results may not be optimal due to an inadequate volume of blood received in culture bottles   Culture   Final    NO GROWTH 3 DAYS Performed at [facility], [place] [address] [place], [place] [address]    Report Status PENDING  Incomplete  Blood Culture (routine x 2)     Status: None (Preliminary result)   Collection Time: 01/24/19  7:36 PM  Result Value Ref Range Status   Specimen Description BLOOD RIGHT HAND  Final   Special Requests   Final    BOTTLES DRAWN AEROBIC AND ANAEROBIC Blood Culture results may not be optimal due to an inadequate volume of blood received in culture bottles   Culture   Final    NO GROWTH 3 DAYS Performed at [facility], [place] [address] [place], [place] [address]    Report Status PENDING  Incomplete  Aerobic/Anaerobic Culture (surgical/deep wound)     Status: None (Preliminary result)   Collection Time:  01/25/19  2:57 PM  Result Value Ref Range Status   Specimen Description GALL BLADDER  Final   Special Requests NONE  Final   Gram Stain   Final    ABUNDANT WBC PRESENT, PREDOMINANTLY PMN MODERATE GRAM NEGATIVE RODS RARE GRAM POSITIVE COCCI Performed at [facility], [address] [place],  [address]    Culture   Final    ABUNDANT ENTEROBACTER HORMAECHEI REPEATING SUSCEPTIBILITIES NO ANAEROBES ISOLATED; CULTURE IN PROGRESS FOR 5 DAYS    Report Status PENDING  Incomplete         Radiology Studies: Mr 3d Recon At Scanner  Result Date: 01/28/2019 CLINICAL DATA:  85 year old male with history of acute cholecystitis in February 1884, complicated by sepsis and respiratory failure. Status post cholecystostomy drainage. Readmission for lethargy, fever, nausea and leukocytosis with newly increased liver function tests. EXAM: MRI ABDOMEN WITHOUT AND WITH CONTRAST (INCLUDING MRCP) TECHNIQUE: Multiplanar multisequence MR imaging of the abdomen was performed both before and after the administration of intravenous contrast. Heavily T2-weighted images of the biliary and pancreatic ducts were obtained, and three-dimensional MRCP images were rendered by post processing. CONTRAST:  9 mL of Gadavist. COMPARISON:  No prior abdominal MRI. CT the abdomen and pelvis 11/21/2018. FINDINGS: Comment: Today's study is limited by considerable patient motion. Lower chest: Small bilateral pleural effusions lying dependently. Signal void in the region of the aortic root related to prior TAVR procedure. Hepatobiliary: No discrete cystic or solid hepatic lesions are confidently identified on today's motion limited examination. MRCP images are largely nondiagnostic. However, other T2 weighted sequences demonstrate no definite intrahepatic biliary  ductal dilatation. A percutaneous cholecystostomy tube is noted. Multiple filling defects within the gallbladder are compatible with multiple gallstones. Gallbladder  does not appear overly distended, and there is no large volume of pericholecystic fluid noted at this time. A trace amount of irregular T2 signal intensity is noted adjacent to the gallbladder, likely within normal limits given the indwelling percutaneous cholecystostomy tube. Innumerable filling defects are also noted within the common bile duct, compatible with choledocholithiasis. Common bile duct appears to measure up to 13 mm distally. Pancreas: Pancreatic atrophy. Multiple well-defined T1 hypointense, T2 hyperintense, nonenhancing lesions are noted throughout the body of the pancreas, measuring up to 1.4 cm (axial image 25 of series 4). No pancreatic ductal dilatation. No peripancreatic fluid collections or inflammatory changes. Spleen:  Unremarkable. Adrenals/Urinary Tract: There are multiple T1 hypointense, T2 hyperintense, nonenhancing lesions in the kidneys bilaterally, compatible with simple cysts, largest of which is exophytic in the posterior aspect of the upper pole of the right kidney measuring 5 cm. No hydroureteronephrosis in the visualized portions of the abdomen. Bilateral adrenal glands are normal in appearance. Stomach/Bowel: Visualized portions are unremarkable. Vascular/Lymphatic: Aortic atherosclerosis with fusiform aneurysmal dilatation of the infrarenal abdominal aorta which measures up to 4.2 x 5.2 cm. No lymphadenopathy identified in the abdomen. Other: No significant volume of ascites noted in the visualized portions of the peritoneal cavity. Musculoskeletal: No aggressive appearing lytic or blastic lesions noted in the visualized portions of the skeleton. IMPRESSION: 1. Choledocholithiasis with dilated common bile duct measuring up to 13 mm in the porta hepatis. Despite the presence of numerous stones in this mildly dilated duct, there is no intrahepatic biliary ductal dilatation noted at this time. 2. Cholelithiasis with percutaneous cholecystostomy tube in place. Minimal surrounding  T2 hyperintensity adjacent to the gallbladder which is likely within normal limits for a patient with an indwelling percutaneous cholecystostomy tube. 3. Aortic atherosclerosis with fusiform dilatation of the infrarenal abdominal aorta. 4. Small bilateral pleural effusions lying dependently. Electronically Signed   By: Vinnie Langton M.D.   On: 01/28/2019 08:04   Mr Abdomen Mrcp Moise Boring Contast  Result Date: 01/28/2019 CLINICAL DATA:  85 year old male with history of acute cholecystitis in February 0998, complicated by sepsis and respiratory failure. Status post cholecystostomy drainage. Readmission for lethargy, fever, nausea and leukocytosis with newly increased liver function tests. EXAM: MRI ABDOMEN WITHOUT AND WITH CONTRAST (INCLUDING MRCP) TECHNIQUE: Multiplanar multisequence MR imaging of the abdomen was performed both before and after the administration of intravenous contrast. Heavily T2-weighted images of the biliary and pancreatic ducts were obtained, and three-dimensional MRCP images were rendered by post processing. CONTRAST:  9 mL of Gadavist. COMPARISON:  No prior abdominal MRI. CT the abdomen and pelvis 11/21/2018. FINDINGS: Comment: Today's study is limited by considerable patient motion. Lower chest: Small bilateral pleural effusions lying dependently. Signal void in the region of the aortic root related to prior TAVR procedure. Hepatobiliary: No discrete cystic or solid hepatic lesions are confidently identified on today's motion limited examination. MRCP images are largely nondiagnostic. However, other T2 weighted sequences demonstrate no definite intrahepatic biliary ductal dilatation. A percutaneous cholecystostomy tube is noted. Multiple filling defects within the gallbladder are compatible with multiple gallstones. Gallbladder does not appear overly distended, and there is no large volume of pericholecystic fluid noted at this time. A trace amount of irregular T2 signal intensity is noted  adjacent to the gallbladder, likely within normal limits given the indwelling percutaneous cholecystostomy tube. Innumerable filling defects are also noted within the  common bile duct, compatible with choledocholithiasis. Common bile duct appears to measure up to 13 mm distally. Pancreas: Pancreatic atrophy. Multiple well-defined T1 hypointense, T2 hyperintense, nonenhancing lesions are noted throughout the body of the pancreas, measuring up to 1.4 cm (axial image 25 of series 4). No pancreatic ductal dilatation. No peripancreatic fluid collections or inflammatory changes. Spleen:  Unremarkable. Adrenals/Urinary Tract: There are multiple T1 hypointense, T2 hyperintense, nonenhancing lesions in the kidneys bilaterally, compatible with simple cysts, largest of which is exophytic in the posterior aspect of the upper pole of the right kidney measuring 5 cm. No hydroureteronephrosis in the visualized portions of the abdomen. Bilateral adrenal glands are normal in appearance. Stomach/Bowel: Visualized portions are unremarkable. Vascular/Lymphatic: Aortic atherosclerosis with fusiform aneurysmal dilatation of the infrarenal abdominal aorta which measures up to 4.2 x 5.2 cm. No lymphadenopathy identified in the abdomen. Other: No significant volume of ascites noted in the visualized portions of the peritoneal cavity. Musculoskeletal: No aggressive appearing lytic or blastic lesions noted in the visualized portions of the skeleton. IMPRESSION: 1. Choledocholithiasis with dilated common bile duct measuring up to 13 mm in the porta hepatis. Despite the presence of numerous stones in this mildly dilated duct, there is no intrahepatic biliary ductal dilatation noted at this time. 2. Cholelithiasis with percutaneous cholecystostomy tube in place. Minimal surrounding T2 hyperintensity adjacent to the gallbladder which is likely within normal limits for a patient with an indwelling percutaneous cholecystostomy tube. 3. Aortic  atherosclerosis with fusiform dilatation of the infrarenal abdominal aorta. 4. Small bilateral pleural effusions lying dependently. Electronically Signed   By: Vinnie Langton M.D.   On: 01/28/2019 08:04        Scheduled Meds:  amiodarone  100 mg Oral Daily   insulin aspart  0-9 Units Subcutaneous Q4H   pantoprazole (PROTONIX) IV  40 mg Intravenous Q24H   sodium chloride flush  3 mL Intravenous Q12H   sodium chloride flush  3 mL Intravenous Q12H   sodium chloride flush  5 mL Intracatheter Q8H   Continuous Infusions:  sodium chloride 10 mL/hr at 01/25/19 0007   ceFEPime (MAXIPIME) IV     metronidazole 500 mg (01/28/19 0953)     LOS: 4 days     Bonnielee Haff, MD [facility]   If 7PM-7AM, please contact night-coverage [email or web address] Password [facility] 01/28/2019, 10:22 AM

## 2019-01-28 NOTE — Progress Notes (Signed)
PHARMACY NOTE:  ANTIMICROBIAL RENAL DOSAGE ADJUSTMENT  Current antimicrobial regimen includes a mismatch between antimicrobial dosage and estimated renal function.  As per policy approved by the Pharmacy & Therapeutics and Medical Executive Committees, the antimicrobial dosage will be adjusted accordingly.  Current antimicrobial dosage:  Cefepime 2g IV Q12 and flagyl 500mg  IV Q8hrs  Indication: cholangitis with sepsis  Renal Function:  Estimated Creatinine Clearance: 64.3 mL/min (by C-G formula based on SCr of 0.89 mg/dL).  Antimicrobial dosage has been changed to:   Cefepime 2g IV Q8hrs Continue flagyl 500mg  IV Q8hrs  Additional comments: Will continue to follow renal function, cultures, LOT  Thank you for allowing pharmacy to be a part of this patient's care.  Juanell Fairly, PharmD PGY1 Pharmacy Resident 01/28/2019 9:04 AM

## 2019-01-28 NOTE — Progress Notes (Signed)
Patient ID: William [name], male   DOB: [birth year], 85 y.o.   MRN: [id number]    Progress Note   Subjective   Pt alert, cooperative, wife at bedside  Agitated during night and pulled on biliary drain -sutures pulled  MRCP - multiple CBD stones  Duct dilated to 13 mm, no intrahepatic dilation,. GB full of stones, cholecystostomy tube in place in GB   Objective   Vital signs in last 24 hours: Temp:  [97.5 F (36.4 C)-98.6 F (37 C)] 98.2 F (36.8 C) (04/16 0425) Pulse Rate:  [79-87] 80 (04/16 0446) Resp:  [10-18] 10 (04/16 0446) BP: (101-122)/(71-80) 108/74 (04/16 0446) SpO2:  [96 %-100 %] 99 % (04/16 0446) Weight:  [92.5 kg] 92.5 kg (04/16 0425)   General:    [name] in NAD, confused , less jaundiced Heart:  Regular rate and rhythm; no murmurs Lungs: Respirations even and unlabored, lungs CTA bilaterally Abdomen:  Soft, nontender and nondistended. Normal bowel sounds.Biliary drain in place - minimal output , brown/cloudy Extremities:  Without edema. Neurologic:  Alert and oriented,  grossly normal neurologically. Psych:  Cooperative. Normal mood and affect.  Intake/Output from previous day: 04/15 0701 - 04/16 0700 In: 230 [I.V.:10; IV Piggyback:200] Out: 1610 [Urine:1600; Drains:10] Intake/Output this shift: No intake/output data recorded.  Lab Results: Recent Labs    01/26/19 0257 01/27/19 0425 01/28/19 0347  WBC 7.7 5.6 5.7  HGB 11.8* 11.1* 11.6*  HCT 35.9* 34.2* 35.4*  PLT 123* 124* 131*   BMET Recent Labs    01/26/19 0257 01/27/19 0425 01/28/19 0347  NA 140 139 134*  K 4.5 3.0* 3.3*  CL 107 105 105  CO2 24 22 20*  GLUCOSE 115* 115* 118*  BUN 14 18 13   CREATININE 1.15 1.00 0.89  CALCIUM 8.6* 8.4* 8.1*   LFT Recent Labs    01/28/19 0347  PROT 6.1*  ALBUMIN 2.3*  AST 33  ALT 83*  ALKPHOS 365*  BILITOT 1.4*   PT/INR No results for input(s): LABPROT, INR in the last 72 hours.  Studies/Results: Mr 3d Recon At Scanner  Result Date:  01/28/2019 CLINICAL DATA:  85 year old male with history of acute cholecystitis in February 1025, complicated by sepsis and respiratory failure. Status post cholecystostomy drainage. Readmission for lethargy, fever, nausea and leukocytosis with newly increased liver function tests. EXAM: MRI ABDOMEN WITHOUT AND WITH CONTRAST (INCLUDING MRCP) TECHNIQUE: Multiplanar multisequence MR imaging of the abdomen was performed both before and after the administration of intravenous contrast. Heavily T2-weighted images of the biliary and pancreatic ducts were obtained, and three-dimensional MRCP images were rendered by post processing. CONTRAST:  9 mL of Gadavist. COMPARISON:  No prior abdominal MRI. CT the abdomen and pelvis 11/21/2018. FINDINGS: Comment: Today's study is limited by considerable patient motion. Lower chest: Small bilateral pleural effusions lying dependently. Signal void in the region of the aortic root related to prior TAVR procedure. Hepatobiliary: No discrete cystic or solid hepatic lesions are confidently identified on today's motion limited examination. MRCP images are largely nondiagnostic. However, other T2 weighted sequences demonstrate no definite intrahepatic biliary ductal dilatation. A percutaneous cholecystostomy tube is noted. Multiple filling defects within the gallbladder are compatible with multiple gallstones. Gallbladder does not appear overly distended, and there is no large volume of pericholecystic fluid noted at this time. A trace amount of irregular T2 signal intensity is noted adjacent to the gallbladder, likely within normal limits given the indwelling percutaneous cholecystostomy tube. Innumerable filling defects are also noted within the common  bile duct, compatible with choledocholithiasis. Common bile duct appears to measure up to 13 mm distally. Pancreas: Pancreatic atrophy. Multiple well-defined T1 hypointense, T2 hyperintense, nonenhancing lesions are noted throughout the body  of the pancreas, measuring up to 1.4 cm (axial image 25 of series 4). No pancreatic ductal dilatation. No peripancreatic fluid collections or inflammatory changes. Spleen:  Unremarkable. Adrenals/Urinary Tract: There are multiple T1 hypointense, T2 hyperintense, nonenhancing lesions in the kidneys bilaterally, compatible with simple cysts, largest of which is exophytic in the posterior aspect of the upper pole of the right kidney measuring 5 cm. No hydroureteronephrosis in the visualized portions of the abdomen. Bilateral adrenal glands are normal in appearance. Stomach/Bowel: Visualized portions are unremarkable. Vascular/Lymphatic: Aortic atherosclerosis with fusiform aneurysmal dilatation of the infrarenal abdominal aorta which measures up to 4.2 x 5.2 cm. No lymphadenopathy identified in the abdomen. Other: No significant volume of ascites noted in the visualized portions of the peritoneal cavity. Musculoskeletal: No aggressive appearing lytic or blastic lesions noted in the visualized portions of the skeleton. IMPRESSION: 1. Choledocholithiasis with dilated common bile duct measuring up to 13 mm in the porta hepatis. Despite the presence of numerous stones in this mildly dilated duct, there is no intrahepatic biliary ductal dilatation noted at this time. 2. Cholelithiasis with percutaneous cholecystostomy tube in place. Minimal surrounding T2 hyperintensity adjacent to the gallbladder which is likely within normal limits for a patient with an indwelling percutaneous cholecystostomy tube. 3. Aortic atherosclerosis with fusiform dilatation of the infrarenal abdominal aorta. 4. Small bilateral pleural effusions lying dependently. Electronically Signed   By: Vinnie Langton M.D.   On: 01/28/2019 08:04   Mr Abdomen Mrcp Moise Boring Contast  Result Date: 01/28/2019 CLINICAL DATA:  85 year old male with history of acute cholecystitis in February 5681, complicated by sepsis and respiratory failure. Status post  cholecystostomy drainage. Readmission for lethargy, fever, nausea and leukocytosis with newly increased liver function tests. EXAM: MRI ABDOMEN WITHOUT AND WITH CONTRAST (INCLUDING MRCP) TECHNIQUE: Multiplanar multisequence MR imaging of the abdomen was performed both before and after the administration of intravenous contrast. Heavily T2-weighted images of the biliary and pancreatic ducts were obtained, and three-dimensional MRCP images were rendered by post processing. CONTRAST:  9 mL of Gadavist. COMPARISON:  No prior abdominal MRI. CT the abdomen and pelvis 11/21/2018. FINDINGS: Comment: Today's study is limited by considerable patient motion. Lower chest: Small bilateral pleural effusions lying dependently. Signal void in the region of the aortic root related to prior TAVR procedure. Hepatobiliary: No discrete cystic or solid hepatic lesions are confidently identified on today's motion limited examination. MRCP images are largely nondiagnostic. However, other T2 weighted sequences demonstrate no definite intrahepatic biliary ductal dilatation. A percutaneous cholecystostomy tube is noted. Multiple filling defects within the gallbladder are compatible with multiple gallstones. Gallbladder does not appear overly distended, and there is no large volume of pericholecystic fluid noted at this time. A trace amount of irregular T2 signal intensity is noted adjacent to the gallbladder, likely within normal limits given the indwelling percutaneous cholecystostomy tube. Innumerable filling defects are also noted within the common bile duct, compatible with choledocholithiasis. Common bile duct appears to measure up to 13 mm distally. Pancreas: Pancreatic atrophy. Multiple well-defined T1 hypointense, T2 hyperintense, nonenhancing lesions are noted throughout the body of the pancreas, measuring up to 1.4 cm (axial image 25 of series 4). No pancreatic ductal dilatation. No peripancreatic fluid collections or inflammatory  changes. Spleen:  Unremarkable. Adrenals/Urinary Tract: There are multiple T1 hypointense, T2 hyperintense, nonenhancing  lesions in the kidneys bilaterally, compatible with simple cysts, largest of which is exophytic in the posterior aspect of the upper pole of the right kidney measuring 5 cm. No hydroureteronephrosis in the visualized portions of the abdomen. Bilateral adrenal glands are normal in appearance. Stomach/Bowel: Visualized portions are unremarkable. Vascular/Lymphatic: Aortic atherosclerosis with fusiform aneurysmal dilatation of the infrarenal abdominal aorta which measures up to 4.2 x 5.2 cm. No lymphadenopathy identified in the abdomen. Other: No significant volume of ascites noted in the visualized portions of the peritoneal cavity. Musculoskeletal: No aggressive appearing lytic or blastic lesions noted in the visualized portions of the skeleton. IMPRESSION: 1. Choledocholithiasis with dilated common bile duct measuring up to 13 mm in the porta hepatis. Despite the presence of numerous stones in this mildly dilated duct, there is no intrahepatic biliary ductal dilatation noted at this time. 2. Cholelithiasis with percutaneous cholecystostomy tube in place. Minimal surrounding T2 hyperintensity adjacent to the gallbladder which is likely within normal limits for a patient with an indwelling percutaneous cholecystostomy tube. 3. Aortic atherosclerosis with fusiform dilatation of the infrarenal abdominal aorta. 4. Small bilateral pleural effusions lying dependently. Electronically Signed   By: Vinnie Langton M.D.   On: 01/28/2019 08:04       Assessment / Plan:    #1 85 yo WM with recurrent biliary sepsis, admitted 4/12 with lethargy, nausea/vomiting, fever and jaundice Hx of severe sepsis 2/20 secondary to acute cholcystitis Previously placed Cholecystostomy tube found to be clogged, pus in Gb, cystic duct obstructed. Drain replaced- Cultures + Enterobacter  On Maxipime and Flagyl BC  negative  LFT's trending down   MRCP yesterday + multiple CBD stones,Multiple gallstones, pancreatic cysts  Long discussion today with daughter and wife . Discussed findings and option for ERCP and removal of stones to help prevent recurrent sepsis, and repeat hospitalizations. Discussed risk of pancreatitis, perforations, bleeding , and failure of procedure . They wish to proceed.  Discussed significant risk of complications with anesthesia. Family asking about avoidance of general anesthesia because of his dementia- explained very unlikely that anesthesia to agree, and likely safer from procedure  Standpoint  Family also requesting surgery be re- involved to consider cholecystectomy this admit   #2 Dementia  #3 s/p AVR replacement #4 CAD - off plavix sine 4/12 #5 EF 20 -25 %   #6 AODM  Plan;  ERCP with sphincterotomy and stone extraction has been scheduled with Dr. Oretha Caprice for tomorrow morning. Continue Maxipime and metronidazole.  Continue to hold Plavix  Please reconsult surgery, family specifically requesting Dr. Greer Pickerel who had seen her during last admission.  Patient will go to radiology for study again this morning  And re- suturing             Principal Problem:   Biliary sepsis Active Problems:   Chronic systolic CHF (congestive heart failure) (HCC)   Ventricular tachycardia (HCC)   Dementia (HCC)   Coronary artery disease involving native coronary artery of native heart without angina pectoris   Hyperglycemia   Acute metabolic encephalopathy   Transaminitis   Dementia without behavioral disturbance ([facility])   Goals of care, counseling/discussion   Palliative care by specialist     LOS: 4 days   Jarvin Ogren EsterwoodPA-C  01/28/2019, 9:11 AM

## 2019-01-28 NOTE — Progress Notes (Signed)
I called and spoke to the patient's daughter Ebony Hail for approximately 1 hour.  Essentially I recommended seeing how he does with his ERCP tomorrow & over the weekend and re-discussing his case on Monday with her to determine whether or not he should proceed with cholecystectomy. I spoke with [facility] earlier today.  I discussed cardiology's assessment regarding his operative risk for surgery (mod to severe risk cardiac risk).  We also talked about the national surgical quality improvement surgical risks calculator score for both open versus laparoscopic cholecystectomy (which I will email later tonight).  We discussed that given his prior abd surgery, laparoscopic surgery may not be able to be performed due to significant adhesions.  We discussed ongoing nonsurgical management with chronic cholecystostomy tube placement.  We discussed the pros and cons of surgery and no surgery.  We discussed the high likelihood that even with surgery he would need to go to a skilled nursing facility unless they were able to provide 24 7 care at home assuming he survived this hospitalization with surgery. we discussed his  hospital mortality with surgery (lap, open) ranges from 8 to 26%.  I also discussed with her that in my opinion that even with surgery and if he has minimal complications that the likelihood of him returning to his functional status that he had around 1 year ago is low.  Leighton Ruff. Redmond Pulling, MD, FACS General, Bariatric, & Minimally Invasive Surgery [facility], [place]

## 2019-01-28 NOTE — Progress Notes (Signed)
Patient ID: William [name], male   DOB: [birth year], 85 y.o.   MRN: [id number]  4/13 procedure: IMPRESSION: 1. Successful exchange of the cholecystostomy tube. 2. Innumerable gallstones. Unable to opacify the common bile duct due to obstruction of the cystic duct. 3. Purulent looking fluid removed from the gallbladder. Fluid sent for culture.  Pt has pulled on tubing and RN wanting evaluation of site  I have seen pt and examined site NT no bleeding No sign of infection But Sutures have been pulled out Tubing does not seem to have migrated from position  With AMS in this pt-- would be best to come back to IR for resuture of drain

## 2019-01-28 NOTE — Consult Note (Addendum)
Cardiology Consult    Patient ID: William [name] MRN: [id number], DOB/AGE: Aug 20, 1928   Admit date: 01/24/2019 Date of Consult: 01/28/2019  Primary Physician: Gayland Curry, DO Primary Cardiologist: Sanda Klein, MD Requesting Provider: Bonnielee Haff, MD  Patient Profile    William [name] is a 85 y.o. male with a history of severe aortic stenosis s/p TAVR in 79/8921, chronic systolic CHF/non-ischemic cardiomyopathy with history of ventricular tachycardia s/p AICD which has been removed due to infection, AAA, obstructive sleep apnea, hyperlipidemia, degenerative arthritis/lumbar vertebral fracture with reduced mobility, cholecystitis and cholangitis with biliary drain in place, who was admitted on 01/24/2019 with biliary sepsis. General surgery as consulted who felt like he may be a poor candidate for surgical intervention. However, infectious disease feels that without cholecystectomy, patient may continue to have recurrent infection and sepsis. Therefore, Cardiology was consulted for a pre-operative evaluation for better risk stratification at the request of Dr. Maryland Pink (Internal Medicine).  History of Present Illness    William [name] is a 85 year old male with the above history who is followed by Dr. Sallyanne Kuster. Patient was admitted in 06/2018 for acute on chronic CHF. Echocardiogram at that time showed LVEF of 20-25% and severe aortic stenosis with mean gradient of 21 mmHg, peak gradient of 53 mmHg, and aortic valve area of 0.66 cm. Cardiac catheterization performed as part of TAVR work-up showed non-obstructive CAD. Patient underwent successful TAVR with a 29 mm Edwards Sapien 3 THV via the TF approach on 08/18/2018.  Postoperative echo showed EF of 30% with normal functioning TAVR with no perivalvular leak with a mean gradient of 9 mmHg.  He was discharged home on aspirin and Plavix and initially did very well and felt much better after his TAVR. He was readmitted from 08/25/2018 to  09/21/2018 for ESBL bacteremia and Klebsiella pacemaker lead vegetation.  He underwent device extraction on 09/15/2018 and was discharged on 6 weeks of IV antibiotics through a PICC line which was planned to be discontinued 10/27/2018. Patient was seen in the clinic on 10/21/2018 by Nell Range, PA-C , for follow up at which time he reported that he was recovering well at [facility] and tolerating rehab well. He was instructed to continue Plavix with discontinuation after 6 months of therapy on 02/2019.  SBE prophylaxis was discussed as well.  Rhythm was noted to be stable and he was continued on amiodarone.  Echo done in the office that days showed no evidence of vegetation.   He was readmitted from 11/21/2018 to 11/27/2018 for severe sepsis secondary to acute cholecystitis and Enterobacter Cloacae bacteremia and acute hypoxic respiratory failure requiring intubation. He had a percutaneous drain placed on 11/22/2018 and was ultimately discharged to a SNF.  Patient presented to the ED on 01/24/2019 via EMS from his SNF for altered mental status and was admitted for severe biliary sepsis. Patient underwent drain study on 01/25/2019. Drain was found to be partially obstructed and cystic duct remained obstructed. General surgery as consulted who felt like he may be a poor candidate for surgical intervention. However, infectious disease feels that without cholecystectomy, patient may continue to have recurrent infection and sepsis. MRI/MRCP yesterday showed choledocholithiasis. Patient scheduled for ERCP tomorrow. Cardiology was consulted for a pre-operative evaluation for better risk stratification.  Given current COVID-19 pandemic and in an effort to minimize exposure, I called into the patient's room to obtain the history. Patient has a history of dementia and also has acute encephalopathy with current illnesses so  I spoke with patient's wife. Patient was in SNF prior to this admission and has been  very debilitated needing assistance with everything. He has basically spent the last few months in bed. However, wife states that he has been doing relatively well from a cardiac standpoint and has not complained of any chest pain, shortness of breath, orthopnea, PND, palpitations, lightheadedness, dizziness, or syncope.  Of note, per chart review patient did have a 9 beat run of non-sustained VT yesterday. Patient was asymptomatic during this.  Past Medical History   Past Medical History:  Diagnosis Date   AAA (abdominal aortic aneurysm) ([place])    7/13 3.8cm   Arthritis    "joints" (01/11/2014)   Asthma    "seasonal; some foods"    Chronic systolic CHF (congestive heart failure) (HCC)    Dementia (HCC)    HLD (hyperlipidemia)    HOH (hard of hearing)    Lumbar vertebral fracture (HCC)    L1- 04/11/2014    Prostate cancer (HCC)    S/P ICD (internal cardiac defibrillator) procedure, 01/11/14 removal of ERI gen and placement of Medtronic Evera XT VR & NEW Right ventricular lead Medtronic 01/12/2014   S/P TAVR (transcatheter aortic valve replacement)    Edwards Sapien 3 THV (size 29 mm, model # [id number], serial # [id number])   Severe aortic stenosis    Sleep apnea    "lost 60# & don't have it anymore" (01/11/2014)   Ventricular tachycardia [facility])     Past Surgical History:  Procedure Laterality Date   CARDIAC CATHETERIZATION  08/20/2004   noncritical CAD,mild global hypokinesis, EF 50%   CARDIAC DEFIBRILLATOR PLACEMENT  08/23/2004   Medtronic   CATARACT EXTRACTION W/ INTRAOCULAR LENS IMPLANT Right    COLECTOMY  1990's   CRYOABLATION N/A 02/24/2014   Procedure: CRYO ABLATION PROSTATE;  Surgeon: Ailene Rud, MD;  Location: [facility] ORS;  Service: Urology;  Laterality: N/A;   HERNIA REPAIR     "abdomen; from colon OR"   ICD LEAD REMOVAL N/A 09/15/2018   Procedure: ICD LEAD REMOVAL EXTRACTION;  Surgeon: Evans Lance, MD;  Location: [facility] OR;  Service: Cardiovascular;   Laterality: N/A;  DR. BARTLE TO BACK UP   IMPLANTABLE CARDIOVERTER DEFIBRILLATOR (ICD) GENERATOR CHANGE N/A 01/11/2014   Procedure: ICD GENERATOR CHANGE;  Surgeon: Sanda Klein, MD;  Location: [place] CATH LAB;  Service: Cardiovascular;  Laterality: N/A;   IR EXCHANGE BILIARY DRAIN  12/18/2018   IR EXCHANGE BILIARY DRAIN  01/05/2019   IR EXCHANGE BILIARY DRAIN  01/25/2019   IR PERC CHOLECYSTOSTOMY  11/22/2018   LEAD REVISION N/A 01/11/2014   Procedure: LEAD REVISION;  Surgeon: Sanda Klein, MD;  Location: [place] CATH LAB;  Service: Cardiovascular;  Laterality: N/A;   NM MYOCAR PERF WALL MOTION  01/29/2012   abnormal c/o infarct/scar,no ischemia present   PROSTATE BIOPSY N/A 11/22/2013   Procedure: PROSTATE BIOPSY AND ULTRASOUND;  Surgeon: Ailene Rud, MD;  Location: [facility];  Service: Urology;  Laterality: N/A;   RIGHT/LEFT HEART CATH AND CORONARY ANGIOGRAPHY N/A 07/06/2018   Procedure: RIGHT/LEFT HEART CATH AND CORONARY ANGIOGRAPHY;  Surgeon: Troy Sine, MD;  Location: [place] CV LAB;  Service: Cardiovascular;  Laterality: N/A;   TEE WITHOUT CARDIOVERSION N/A 08/18/2018   Procedure: TRANSESOPHAGEAL ECHOCARDIOGRAM (TEE);  Surgeon: Burnell Blanks, MD;  Location: [place];  Service: Open Heart Surgery;  Laterality: N/A;   TEE WITHOUT CARDIOVERSION N/A 09/08/2018   Procedure: TRANSESOPHAGEAL ECHOCARDIOGRAM (TEE);  Surgeon: Lelon Perla, MD;  Location: [facility] ENDOSCOPY;  Service: Cardiovascular;  Laterality: N/A;   TEE WITHOUT CARDIOVERSION N/A 09/15/2018   Procedure: TRANSESOPHAGEAL ECHOCARDIOGRAM (TEE);  Surgeon: Evans Lance, MD;  Location: [facility] OR;  Service: Cardiovascular;  Laterality: N/A;   TEE WITHOUT CARDIOVERSION N/A 11/26/2018   Procedure: TRANSESOPHAGEAL ECHOCARDIOGRAM (TEE);  Surgeon: Jerline Pain, MD;  Location: [facility] ENDOSCOPY;  Service: Cardiovascular;  Laterality: N/A;   TRANSCATHETER AORTIC VALVE REPLACEMENT, TRANSFEMORAL  08/18/2018   TRANSCATHETER AORTIC  VALVE REPLACEMENT, TRANSFEMORAL N/A 08/18/2018   Procedure: TRANSCATHETER AORTIC VALVE REPLACEMENT, TRANSFEMORAL using an Edwards 31mm Aortic Valve;  Surgeon: Burnell Blanks, MD;  Location: [place];  Service: Open Heart Surgery;  Laterality: N/A;     Allergies  Allergies  Allergen Reactions   Crestor [Rosuvastatin] Other (See Comments)    Hurts muscles   Lipitor [Atorvastatin] Other (See Comments)    Hurts stomach   Shrimp [Shellfish Allergy] Other (See Comments)    On MAR    Inpatient Medications     amiodarone  100 mg Oral Daily   insulin aspart  0-9 Units Subcutaneous Q4H   pantoprazole (PROTONIX) IV  40 mg Intravenous Q24H   sodium chloride flush  3 mL Intravenous Q12H   sodium chloride flush  3 mL Intravenous Q12H   sodium chloride flush  5 mL Intracatheter Q8H    Family History    Family History  Problem Relation Age of Onset   Heart failure Mother        Died of "old age" at 35   Pneumonia Father    He indicated that his mother is deceased. He indicated that his father is deceased. He indicated that his maternal grandmother is deceased. He indicated that his maternal grandfather is deceased. He indicated that his paternal grandmother is deceased.   Social History    Social History   Socioeconomic History   Marital status: Married    Spouse name: Not on file   Number of children: 2   Years of education: Not on file   Highest education level: Not on file  Occupational History   Occupation: Owned a Scientist, [employer]  Social Needs   Emergency planning/management officer strain: Not on file   Food insecurity:    Worry: Not on file    Inability: Not on file   Transportation needs:    Medical: Not on file    Non-medical: Not on file  Tobacco Use   Smoking status: Never Smoker   Smokeless tobacco: Never Used  Substance and Sexual Activity   Alcohol use: Yes    Alcohol/week: 0.0 standard drinks    Comment: 01/11/2014 "drink 1/2 of a beer twice  per month    Drug use: No   Sexual activity: Not Currently  Lifestyle   Physical activity:    Days per week: Not on file    Minutes per session: Not on file   Stress: Not on file  Relationships   Social connections:    Talks on phone: Not on file    Gets together: Not on file    Attends religious service: Not on file    Active member of club or organization: Not on file    Attends meetings of clubs or organizations: Not on file    Relationship status: Not on file   Intimate partner violence:    Fear of current or ex partner: Not on file    Emotionally abused: Not on file    Physically abused: Not on file  Forced sexual activity: Not on file  Other Topics Concern   Not on file  Social History Narrative   Not on file     Review of Systems    Please see HPI. Unable to obtain full ROS due to patient altered mental status/confusion (acute encephalopathy with history of dementia). ROS obtained in detail from his wife, William [name], who is at the bedside.  Physical Exam    Physical Exam per MD:  Blood pressure 108/74, pulse 80, temperature 98.2 F (36.8 C), resp. rate 10, height 5\' 10"  (1.778 m), weight 92.5 kg, SpO2 99 %.    Labs    Troponin (Point of Care Test) No results for input(s): TROPIPOC in the last 72 hours. No results for input(s): CKTOTAL, CKMB, TROPONINI in the last 72 hours. Lab Results  Component Value Date   WBC 5.7 01/28/2019   HGB 11.6 (L) 01/28/2019   HCT 35.4 (L) 01/28/2019   MCV 91.5 01/28/2019   PLT 131 (L) 01/28/2019    Recent Labs  Lab 01/28/19 0347  NA 134*  K 3.3*  CL 105  CO2 20*  BUN 13  CREATININE 0.89  CALCIUM 8.1*  PROT 6.1*  BILITOT 1.4*  ALKPHOS 365*  ALT 83*  AST 33  GLUCOSE 118*   Lab Results  Component Value Date   CHOL 140 01/27/2019   HDL 22 (L) 01/27/2019   LDLCALC 100 (H) 01/27/2019   TRIG 91 01/27/2019   No results found for: [facility]   Radiology Studies    Mr 3d Recon At Scanner  Result Date:  01/28/2019 CLINICAL DATA:  85 year old male with history of acute cholecystitis in February 5427, complicated by sepsis and respiratory failure. Status post cholecystostomy drainage. Readmission for lethargy, fever, nausea and leukocytosis with newly increased liver function tests. EXAM: MRI ABDOMEN WITHOUT AND WITH CONTRAST (INCLUDING MRCP) TECHNIQUE: Multiplanar multisequence MR imaging of the abdomen was performed both before and after the administration of intravenous contrast. Heavily T2-weighted images of the biliary and pancreatic ducts were obtained, and three-dimensional MRCP images were rendered by post processing. CONTRAST:  9 mL of Gadavist. COMPARISON:  No prior abdominal MRI. CT the abdomen and pelvis 11/21/2018. FINDINGS: Comment: Today's study is limited by considerable patient motion. Lower chest: Small bilateral pleural effusions lying dependently. Signal void in the region of the aortic root related to prior TAVR procedure. Hepatobiliary: No discrete cystic or solid hepatic lesions are confidently identified on today's motion limited examination. MRCP images are largely nondiagnostic. However, other T2 weighted sequences demonstrate no definite intrahepatic biliary ductal dilatation. A percutaneous cholecystostomy tube is noted. Multiple filling defects within the gallbladder are compatible with multiple gallstones. Gallbladder does not appear overly distended, and there is no large volume of pericholecystic fluid noted at this time. A trace amount of irregular T2 signal intensity is noted adjacent to the gallbladder, likely within normal limits given the indwelling percutaneous cholecystostomy tube. Innumerable filling defects are also noted within the common bile duct, compatible with choledocholithiasis. Common bile duct appears to measure up to 13 mm distally. Pancreas: Pancreatic atrophy. Multiple well-defined T1 hypointense, T2 hyperintense, nonenhancing lesions are noted throughout the body  of the pancreas, measuring up to 1.4 cm (axial image 25 of series 4). No pancreatic ductal dilatation. No peripancreatic fluid collections or inflammatory changes. Spleen:  Unremarkable. Adrenals/Urinary Tract: There are multiple T1 hypointense, T2 hyperintense, nonenhancing lesions in the kidneys bilaterally, compatible with simple cysts, largest of which is exophytic in the posterior aspect of the upper  pole of the right kidney measuring 5 cm. No hydroureteronephrosis in the visualized portions of the abdomen. Bilateral adrenal glands are normal in appearance. Stomach/Bowel: Visualized portions are unremarkable. Vascular/Lymphatic: Aortic atherosclerosis with fusiform aneurysmal dilatation of the infrarenal abdominal aorta which measures up to 4.2 x 5.2 cm. No lymphadenopathy identified in the abdomen. Other: No significant volume of ascites noted in the visualized portions of the peritoneal cavity. Musculoskeletal: No aggressive appearing lytic or blastic lesions noted in the visualized portions of the skeleton. IMPRESSION: 1. Choledocholithiasis with dilated common bile duct measuring up to 13 mm in the porta hepatis. Despite the presence of numerous stones in this mildly dilated duct, there is no intrahepatic biliary ductal dilatation noted at this time. 2. Cholelithiasis with percutaneous cholecystostomy tube in place. Minimal surrounding T2 hyperintensity adjacent to the gallbladder which is likely within normal limits for a patient with an indwelling percutaneous cholecystostomy tube. 3. Aortic atherosclerosis with fusiform dilatation of the infrarenal abdominal aorta. 4. Small bilateral pleural effusions lying dependently. Electronically Signed   By: Vinnie Langton M.D.   On: 01/28/2019 08:04   Dg Chest Port 1 View  Result Date: 01/24/2019 CLINICAL DATA:  Tachypnea and possible sepsis EXAM: PORTABLE CHEST 1 VIEW COMPARISON:  12/12/2018 FINDINGS: Cardiac shadow is enlarged. Changes of prior TAVR are  again seen. Aortic calcifications are noted. The lungs are well aerated bilaterally. Mild diffuse interstitial changes are noted similar to that seen on the prior exam. Patchy atelectasis/early infiltrate is noted in the left base. No bony abnormality is seen. IMPRESSION: Patchy left basilar atelectasis/early infiltrate. Electronically Signed   By: Inez Catalina M.D.   On: 01/24/2019 20:10   Mr Abdomen Mrcp Moise Boring Contast  Result Date: 01/28/2019 CLINICAL DATA:  85 year old male with history of acute cholecystitis in February 6440, complicated by sepsis and respiratory failure. Status post cholecystostomy drainage. Readmission for lethargy, fever, nausea and leukocytosis with newly increased liver function tests. EXAM: MRI ABDOMEN WITHOUT AND WITH CONTRAST (INCLUDING MRCP) TECHNIQUE: Multiplanar multisequence MR imaging of the abdomen was performed both before and after the administration of intravenous contrast. Heavily T2-weighted images of the biliary and pancreatic ducts were obtained, and three-dimensional MRCP images were rendered by post processing. CONTRAST:  9 mL of Gadavist. COMPARISON:  No prior abdominal MRI. CT the abdomen and pelvis 11/21/2018. FINDINGS: Comment: Today's study is limited by considerable patient motion. Lower chest: Small bilateral pleural effusions lying dependently. Signal void in the region of the aortic root related to prior TAVR procedure. Hepatobiliary: No discrete cystic or solid hepatic lesions are confidently identified on today's motion limited examination. MRCP images are largely nondiagnostic. However, other T2 weighted sequences demonstrate no definite intrahepatic biliary ductal dilatation. A percutaneous cholecystostomy tube is noted. Multiple filling defects within the gallbladder are compatible with multiple gallstones. Gallbladder does not appear overly distended, and there is no large volume of pericholecystic fluid noted at this time. A trace amount of irregular T2  signal intensity is noted adjacent to the gallbladder, likely within normal limits given the indwelling percutaneous cholecystostomy tube. Innumerable filling defects are also noted within the common bile duct, compatible with choledocholithiasis. Common bile duct appears to measure up to 13 mm distally. Pancreas: Pancreatic atrophy. Multiple well-defined T1 hypointense, T2 hyperintense, nonenhancing lesions are noted throughout the body of the pancreas, measuring up to 1.4 cm (axial image 25 of series 4). No pancreatic ductal dilatation. No peripancreatic fluid collections or inflammatory changes. Spleen:  Unremarkable. Adrenals/Urinary Tract: There are multiple T1  hypointense, T2 hyperintense, nonenhancing lesions in the kidneys bilaterally, compatible with simple cysts, largest of which is exophytic in the posterior aspect of the upper pole of the right kidney measuring 5 cm. No hydroureteronephrosis in the visualized portions of the abdomen. Bilateral adrenal glands are normal in appearance. Stomach/Bowel: Visualized portions are unremarkable. Vascular/Lymphatic: Aortic atherosclerosis with fusiform aneurysmal dilatation of the infrarenal abdominal aorta which measures up to 4.2 x 5.2 cm. No lymphadenopathy identified in the abdomen. Other: No significant volume of ascites noted in the visualized portions of the peritoneal cavity. Musculoskeletal: No aggressive appearing lytic or blastic lesions noted in the visualized portions of the skeleton. IMPRESSION: 1. Choledocholithiasis with dilated common bile duct measuring up to 13 mm in the porta hepatis. Despite the presence of numerous stones in this mildly dilated duct, there is no intrahepatic biliary ductal dilatation noted at this time. 2. Cholelithiasis with percutaneous cholecystostomy tube in place. Minimal surrounding T2 hyperintensity adjacent to the gallbladder which is likely within normal limits for a patient with an indwelling percutaneous  cholecystostomy tube. 3. Aortic atherosclerosis with fusiform dilatation of the infrarenal abdominal aorta. 4. Small bilateral pleural effusions lying dependently. Electronically Signed   By: Vinnie Langton M.D.   On: 01/28/2019 08:04   Ir Exchange Biliary Drain  Result Date: 01/25/2019 INDICATION: 85 year old with biliary sepsis and history of a cholecystostomy tube. EXAM: CHOLECYSTOSTOMY TUBE EXCHANGE WITH FLUOROSCOPY CHOLANGIOGRAM THROUGH EXISTING CATHETER MEDICATIONS: None ANESTHESIA/SEDATION: None FLUOROSCOPY TIME:  Fluoroscopy Time: 1 minutes, 12 seconds, 9 mGy COMPLICATIONS: None immediate. PROCEDURE: Informed written consent was obtained from the patient after a thorough discussion of the procedural risks, benefits and alternatives. All questions were addressed. Maximal Sterile Barrier Technique was utilized including caps, mask, sterile gowns, sterile gloves, sterile drape, hand hygiene and skin antiseptic. A timeout was performed prior to the initiation of the procedure. The existing gallbladder drainage catheter was prepped and draped in a sterile fashion. The retention suture was still intact. It was difficult to flush the catheter and the tube appeared to be partially obstructed. Purulent looking fluid was draining around the catheter during injection of the tube. The tube was found to be within the gallbladder. The retention suture was removed. The catheter was cut. The catheter was removed over a stiff Amplatz wire. A new 12 [place] multipurpose drain was easily advanced over the wire and reconstituted in the gallbladder. Approximately 40 mL of light brown purulent looking fluid was removed from the gallbladder. Small amount of contrast was injected through the gallbladder drain. Drain was flushed with normal saline. Skin was anesthetized with 1% lidocaine and sutured to skin with Prolene suture. FINDINGS: Old catheter was well positioned in the gallbladder but the tube appeared to be partially  obstructed. Greater than 40 mL of brown purulent looking fluid was removed from the gallbladder after the drain exchange. Fluid was sent for culture. Cholangiogram demonstrated partial filling of the cystic duct but no filling of the common bile duct. Innumerable gallstones. IMPRESSION: 1. Successful exchange of the cholecystostomy tube. 2. Innumerable gallstones. Unable to opacify the common bile duct due to obstruction of the cystic duct. 3. Purulent looking fluid removed from the gallbladder. Fluid sent for culture. Electronically Signed   By: Markus Daft M.D.   On: 01/25/2019 17:11   Ir Exchange Biliary Drain  Result Date: 01/05/2019 INDICATION: History of acute cholecystitis, poor operative candidate, post image guided cholecystostomy tube placement on 11/22/2018. Patient recently returned for fluoroscopic guided exchange and repositioning on 12/18/2018 secondary  to inverted retraction of the existing cholecystostomy tube and presents today for the same reason. EXAM: FLUOROSCOPIC GUIDED CHOLECYSTOSTOMY TUBE EXCHANGE AND UP SIZING COMPARISON:  Image guided cholecystostomy tube placement-11/22/2018; fluoroscopic guided cholecystostomy tube exchange replacement-12/18/2018 MEDICATIONS: None ANESTHESIA/SEDATION: None CONTRAST:  26mL OMNIPAQUE IOHEXOL 300 MG/ML SOLN - administered into the gallbladder fossa. FLUOROSCOPY TIME:  3 minutes, 42 seconds (675 mGy) COMPLICATIONS: None immediate. PROCEDURE: Informed written consent was obtained from the patient's wife after a discussion of the risks, benefits and alternatives to treatment. Questions regarding the procedure were encouraged and answered. A timeout was performed prior to the initiation of the procedure. The external portion of the existing cholecystostomy tube as well as the surrounding skin was prepped and draped in usual sterile fashion. Preprocedural spot fluoroscopic image was obtained of the right upper abdominal quadrant demonstrating retraction of the  cholecystostomy tube with end coiled and locked peripheral to the expected location of the gallbladder lumen. Contrast was injected via the existing cholecystostomy tube however nearly all the injected contrast reflux along the cholecystostomy track to the skin entrance site. As such, the external portion of the existing malposition cholecystostomy tube was cut and the tube was removed intact. The entrance site of the prior cholecystostomy tube was cannulated with a Christmas tree adapter and contrast injection was performed demonstrating patency of the cholecystostomy track with opacification of the gallbladder lumen. With the use of a regular glidewire, a Kumpe catheter was advanced through the serpiginous cholecystostomy track to the level of the gallbladder lumen. Contrast injection confirmed appropriate positioning. Next, over a short Amplatz wire, the existing Kumpe catheter was exchanged for a new slightly larger, now 6 [place], cholecystostomy tube with end ultimately coiled and locked within the gallbladder lumen. Limited contrast injection confirmed appropriate position functionality of the new slightly larger cholecystostomy tube. The external portion of the cholecystostomy tube was secured at the skin entrance site within interrupted suture and a Stat Lock device. Cholecystostomy tube was reconnected to a gravity bag. A dressing was placed. The patient tolerated the procedure well without immediate postprocedural complication. IMPRESSION: Successful fluoroscopic guided replacement and up sizing of now 86 French cholecystostomy tube. Electronically Signed   By: Sandi Mariscal M.D.   On: 01/05/2019 15:02   US Abdomen Limited Ruq  Result Date: 01/24/2019 CLINICAL DATA:  85 y/o  M; transaminitis. EXAM: ULTRASOUND ABDOMEN LIMITED RIGHT UPPER QUADRANT COMPARISON:  11/21/2018 CT abdomen and pelvis and abdominal ultrasound. 11/22/2018, 12/18/2018, 01/05/2019 interventional radiology cholecystostomy and  cholecystostomy exchanges. FINDINGS: Gallbladder: Cholelithiasis with the largest stone measuring 9 mm near the gallbladder neck. Gallbladder wall thickening to 4.9 mm. Negative sonographic Murphy's sign. No pericholecystic fluid. Linear echogenic structure likely representing the cholecystostomy tube is partially visualized within gallbladder fossa, limited evaluation due to overlying bandaging. Common bile duct: Diameter: 7.3 mm Liver: No focal lesion identified. Within normal limits in parenchymal echogenicity. Portal vein is patent on color Doppler imaging with normal direction of blood flow towards the liver. IMPRESSION: 1. Exam limited due to overlying bandaging in the right upper quadrant. 2. Mild gallbladder wall thickening, probably chronic sequelae of cholecystitis and cholecystostomy tube. Negative sonographic Murphy's sign. Cholelithiasis again noted. 3. No significant intra or extrahepatic biliary ductal dilatation identified. Electronically Signed   By: Kristine Garbe M.D.   On: 01/24/2019 22:25    EKG     EKG: EKG was personally reviewed and demonstrates: Sinus tachycardia with known LBBB.  Telemetry: Telemetry was personally reviewed and demonstrates: NSR, one 9-beat episode of  NSVT  Cardiac Imaging    TEE 11/26/2018: 1. The left ventricle has has severely reduced systolic function, with an ejection fraction of 25-30%. The cavity size was normal. Left ventricular diffuse hypokinesis.  2. The right ventricle has normal systolic function. The cavity was normal. There is no increase in right ventricular wall thickness.  3. The mitral valve is degenerative. There is mild thickening. Mitral valve regurgitation is mild to moderate by color flow Doppler. No mitral valve vegetation visualized.  4. The tricuspid valve was normal in structure.  5. A 63mm an Wende Crease Sapien bioprosthetic aortic valve (TAVR) valve is present in the aortic position. Procedure Date: 08/18/2018 Normal  aortic valve prosthesis.  6. The pulmonic valve was normal in structure.  7. No endocarditis. _______________  Right/Left Heart Catheterization 07/06/2018:  Dist RCA lesion is 20% stenosed.  Mid LM lesion is 20% stenosed.  Ost LAD lesion is 20% stenosed.  Prox LAD lesion is 30% stenosed.  Mid LAD lesion is 30% stenosed.  Dist Cx lesion is 50% stenosed.  Ost Cx to Prox Cx lesion is 20% stenosed.  Mid Cx lesion is 30% stenosed.   Significant coronary calcification involving all coronary arteries with 20% smooth distal left main stenosis; 20-30% proximal and mid LAD stenoses; 20 and 30% proximal and mid AV groove circumflex stenosis with 50% distal stenosis; and significant calcification of the RCA with mild luminal irregularity and 20% narrowing.  Upper normal right heart pressures.  Severe low gradient aortic valve stenosis in this patient with echocardiographic documentation of an ejection fraction of 20 to 25%.  The peak to peak gradient is 33 mm with a mean gradient of 26 mm and aortic valve area of 0.7 to 0.9 cm2.  Supravalvular aortography demonstrates calcified aortic valve with severely reduced excursion.  The aortic root is mildly dilated.  Recommendation: Mr. Jung Yurchak has severe low gradient aortic valve stenosis which may be contributing to his severe LV dysfunction.  There is significant coronary calcification without high-grade obstructive stenoses.  Depending upon functional status, consider possible surgical evaluation for consideration of aortic valve replacement. Recommend Aspirin 81mg  daily for moderate CAD.  Assessment & Plan    Pre-Operative Evaluation - Patient admitted for severe biliary sepsis. General surgery as consulted who felt like he may be a poor candidate for surgical intervention. However, infectious disease feels that without cholecystectomy, patient may continue to have recurrent infection and sepsis. Therefore, Cardiology was consulted  for a pre-operative evaluation for better risk stratification. - EKG shows sinus tachycardia with known LBBB. - Cardiac catheterization from 06/2018 showed significant coronary calcification without high-grade obstructive stenoses. - Patient in SNF prior to admission and is very debilitated. Functional status poor. Wife states he has basically just laid in bed the last few months and needs assistance with everything. However, no obvious cardiac symptoms. - Per the Revised Cardiac Risk Index, considered moderate risk with a  6.6% chance of cardiac complications. However, this does not take into account the patient's advanced age, severe aortic stenosis s/p TAVR, history of ventricular tachycardia, and recurrent sepsis. Risk likely higher. - MD to follow with any additional recommendations.  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  - Patient not on any diuretics at home.  - Patient on Toprol-XL 25mg  daily at home. However, this was held on admission.  - Patient does not appear to be on ACEi/ARB/ARNI at home. - Continue  to monitor volume status closely.  Ventricular Tachycardia - AICD was removed in 09/2018 due to ESBL bacteremia and Klebsiella pacemaker lead vegetation. - Per chart review, patient had 9 beats of non-sustained VT yesterday. - Continue Amiodarone 100mg  daily. - Continue to monitor on telemetry.  Severe Aortic Stenosis s/p TAVR - TEE from 11/2018 showed normal aortic valve prosthesis. - Follow-up with Structural Team as directed.  Otherwise, per primary team.   Signed, Darreld Mclean, PA-C 01/28/2019, 10:30 AM Pager: [phone] For questions or updates, please contact   Please consult [email or web address] for contact info under Cardiology/STEMI.  I have seen and examined the patient along with Darreld Mclean, PA-C.  I have reviewed the chart, notes and new data.  I agree with PA's  note.  Key new complaints: he is severely deconditioned Key examination changes: limited interaction via phone/video, no changes Key new findings / data: NSR/sinus tachy, old LBBB on ECG; K 3.3; mildly abnormal LFTs with improving trend  PLAN: Moderate risk for serious CV complications with ERCP. Moderate to severe increase in CV risk with cholecystectomy. This is primarily due to his age and severe deconditioning related to multiple surgical procedures over the last several months. His CHF has been well compensated and his VT has been exquisitely responsive to amiodarone. Discussed at length with his wife William [name], who is at [employer] bedside. She understands that the risk of surgery is high, but also understands that this is likely to be a constantly recurring illness until all his gallstones and the gallbladder are surgically removed. She believes [name] would prefer to take the risk of surgery, rather than having to deal with this slow lingering decline.  Total time spent with the patient 35 minutes.  Sanda Klein, MD, [place] [phone] 01/28/2019, 12:58 PM

## 2019-01-28 NOTE — Procedures (Signed)
Interventional Radiology Procedure:   Indications: Suture for cholecystostomy tube is broken  Procedure: Drain check and placement of new suture  Findings: Drain is well positioned in gallbladder.  New suture placed.  Complications: None     EBL: None  Plan: Continue gallbladder drainage.     William [name] R. Anselm Pancoast, MD  Pager: [phone]

## 2019-01-29 ENCOUNTER — Inpatient Hospital Stay (HOSPITAL_COMMUNITY): Payer: Medicare Other | Admitting: Anesthesiology

## 2019-01-29 ENCOUNTER — Encounter (HOSPITAL_COMMUNITY): Payer: Self-pay | Admitting: Gastroenterology

## 2019-01-29 ENCOUNTER — Encounter (HOSPITAL_COMMUNITY)
Admission: EM | Disposition: A | Discharge status: Home Health | Payer: Self-pay | Source: Skilled Nursing Facility | Attending: Family Medicine

## 2019-01-29 ENCOUNTER — Inpatient Hospital Stay (HOSPITAL_COMMUNITY): Payer: Medicare Other

## 2019-01-29 DIAGNOSIS — I493 Ventricular premature depolarization: Secondary | ICD-10-CM

## 2019-01-29 DIAGNOSIS — D649 Anemia, unspecified: Secondary | ICD-10-CM

## 2019-01-29 DIAGNOSIS — I5042 Chronic combined systolic (congestive) and diastolic (congestive) heart failure: Secondary | ICD-10-CM

## 2019-01-29 DIAGNOSIS — D696 Thrombocytopenia, unspecified: Secondary | ICD-10-CM

## 2019-01-29 HISTORY — PX: REMOVAL OF STONES: SHX5545

## 2019-01-29 HISTORY — PX: ERCP: SHX5425

## 2019-01-29 HISTORY — PX: SPHINCTEROTOMY: SHX5544

## 2019-01-29 LAB — COMPREHENSIVE METABOLIC PANEL
ALT: 62 U/L — ABNORMAL HIGH (ref 0–44)
AST: 27 U/L (ref 15–41)
Albumin: 2.3 g/dL — ABNORMAL LOW (ref 3.5–5.0)
Alkaline Phosphatase: 308 U/L — ABNORMAL HIGH (ref 38–126)
Anion gap: 5 (ref 5–15)
BUN: 12 mg/dL (ref 8–23)
CO2: 26 mmol/L (ref 22–32)
Calcium: 8.4 mg/dL — ABNORMAL LOW (ref 8.9–10.3)
Chloride: 105 mmol/L (ref 98–111)
Creatinine, Ser: 0.95 mg/dL (ref 0.61–1.24)
GFR calc Af Amer: >60 mL/min (ref >60)
GFR calc non Af Amer: >60 mL/min (ref >60)
Glucose, Bld: 131 mg/dL — ABNORMAL HIGH (ref 70–99)
Potassium: 3.8 mmol/L (ref 3.5–5.1)
Sodium: 136 mmol/L (ref 135–145)
Total Bilirubin: 1.5 mg/dL — ABNORMAL HIGH (ref 0.3–1.2)
Total Protein: 6.2 g/dL — ABNORMAL LOW (ref 6.5–8.1)

## 2019-01-29 LAB — GLUCOSE, CAPILLARY
Glucose-Capillary: 112 mg/dL — ABNORMAL HIGH (ref 70–99)
Glucose-Capillary: 126 mg/dL — ABNORMAL HIGH (ref 70–99)
Glucose-Capillary: 126 mg/dL — ABNORMAL HIGH (ref 70–99)
Glucose-Capillary: 130 mg/dL — ABNORMAL HIGH (ref 70–99)
Glucose-Capillary: 162 mg/dL — ABNORMAL HIGH (ref 70–99)

## 2019-01-29 LAB — CULTURE, BLOOD (ROUTINE X 2)
Culture: NO GROWTH
Culture: NO GROWTH

## 2019-01-29 LAB — CBC
HCT: 35.3 % — ABNORMAL LOW (ref 39.0–52.0)
Hemoglobin: 11.9 g/dL — ABNORMAL LOW (ref 13.0–17.0)
MCH: 31 pg (ref 26.0–34.0)
MCHC: 33.7 g/dL (ref 30.0–36.0)
MCV: 91.9 fL (ref 80.0–100.0)
Platelets: 127 10*3/uL — ABNORMAL LOW (ref 150–400)
RBC: 3.84 MIL/uL — ABNORMAL LOW (ref 4.22–5.81)
RDW: 15.6 % — ABNORMAL HIGH (ref 11.5–15.5)
WBC: 6.7 10*3/uL (ref 4.0–10.5)
nRBC: 0 % (ref 0.0–0.2)

## 2019-01-29 LAB — PREALBUMIN: Prealbumin: 13.9 mg/dL — ABNORMAL LOW (ref 18–38)

## 2019-01-29 LAB — MAGNESIUM: Magnesium: 2 mg/dL (ref 1.7–2.4)

## 2019-01-29 SURGERY — ERCP, WITH INTERVENTION IF INDICATED
Anesthesia: General

## 2019-01-29 MED ORDER — FENTANYL CITRATE (PF) 250 MCG/5ML IJ SOLN
INTRAMUSCULAR | Status: DC | PRN
  Administered 2019-01-29: 50 ug via INTRAVENOUS

## 2019-01-29 MED ORDER — ESMOLOL HCL 100 MG/10ML IV SOLN
INTRAVENOUS | Status: DC | PRN
  Administered 2019-01-29 (×4): 10 mg via INTRAVENOUS

## 2019-01-29 MED ORDER — EPHEDRINE SULFATE 50 MG/ML IJ SOLN
INTRAMUSCULAR | Status: DC | PRN
  Administered 2019-01-29: 10 mg via INTRAVENOUS

## 2019-01-29 MED ORDER — GLUCAGON HCL RDNA (DIAGNOSTIC) 1 MG IJ SOLR
INTRAMUSCULAR | Status: AC
  Filled 2019-01-29: qty 1

## 2019-01-29 MED ORDER — PROPOFOL 10 MG/ML IV BOLUS
INTRAVENOUS | Status: DC | PRN
  Administered 2019-01-29: 10 mg via INTRAVENOUS
  Administered 2019-01-29: 20 mg via INTRAVENOUS
  Administered 2019-01-29: 10 mg via INTRAVENOUS

## 2019-01-29 MED ORDER — POTASSIUM CHLORIDE CRYS ER 20 MEQ PO TBCR
40.0000 meq | EXTENDED_RELEASE_TABLET | Freq: Once | ORAL | Status: AC
  Administered 2019-01-29: 19:00:00 40 meq via ORAL
  Filled 2019-01-29: qty 2

## 2019-01-29 MED ORDER — INDOMETHACIN 50 MG RE SUPP
RECTAL | Status: AC
  Filled 2019-01-29: qty 2

## 2019-01-29 MED ORDER — SODIUM CHLORIDE 0.9 % IV SOLN
INTRAVENOUS | Status: DC | PRN
  Administered 2019-01-29: 30 mL

## 2019-01-29 MED ORDER — ETOMIDATE 2 MG/ML IV SOLN
INTRAVENOUS | Status: DC | PRN
  Administered 2019-01-29: 14 mg via INTRAVENOUS

## 2019-01-29 MED ORDER — INDOMETHACIN 50 MG RE SUPP
RECTAL | Status: DC | PRN
  Administered 2019-01-29: 100 mg via RECTAL

## 2019-01-29 MED ORDER — ALBUMIN HUMAN 5 % IV SOLN
INTRAVENOUS | Status: DC | PRN
  Administered 2019-01-29: 10:00:00 via INTRAVENOUS

## 2019-01-29 MED ORDER — SUGAMMADEX SODIUM 200 MG/2ML IV SOLN
INTRAVENOUS | Status: DC | PRN
  Administered 2019-01-29: 176.8 mg via INTRAVENOUS

## 2019-01-29 MED ORDER — PHENYLEPHRINE HCL (PRESSORS) 10 MG/ML IV SOLN
INTRAVENOUS | Status: DC | PRN
  Administered 2019-01-29: 120 ug via INTRAVENOUS
  Administered 2019-01-29 (×2): 80 ug via INTRAVENOUS
  Administered 2019-01-29: 120 ug via INTRAVENOUS

## 2019-01-29 MED ORDER — SODIUM CHLORIDE 0.9 % IV SOLN
INTRAVENOUS | Status: DC | PRN
  Administered 2019-01-29: 25 ug/min via INTRAVENOUS
  Administered 2019-01-29: 35 ug/min via INTRAVENOUS

## 2019-01-29 MED ORDER — ROCURONIUM BROMIDE 50 MG/5ML IV SOSY
PREFILLED_SYRINGE | INTRAVENOUS | Status: DC | PRN
  Administered 2019-01-29: 50 mg via INTRAVENOUS

## 2019-01-29 MED ORDER — SUCCINYLCHOLINE CHLORIDE 200 MG/10ML IV SOSY
PREFILLED_SYRINGE | INTRAVENOUS | Status: DC | PRN
  Administered 2019-01-29: 120 mg via INTRAVENOUS

## 2019-01-29 NOTE — Progress Notes (Addendum)
Progress Note  Patient Name: William [name] Date of Encounter: 01/29/2019  Primary Cardiologist: Sanda Klein, MD  Subjective   APP pre-round note. Pt in endo so unable to call into room.  Inpatient Medications    Scheduled Meds:  [MAR Hold] amiodarone  100 mg Oral Daily   [MAR Hold] insulin aspart  0-9 Units Subcutaneous Q4H   [MAR Hold] pantoprazole (PROTONIX) IV  40 mg Intravenous Q24H   [MAR Hold] sodium chloride flush  3 mL Intravenous Q12H   [MAR Hold] sodium chloride flush  3 mL Intravenous Q12H   [MAR Hold] sodium chloride flush  5 mL Intracatheter Q8H   Continuous Infusions:  [MAR Hold] sodium chloride 10 mL/hr at 01/25/19 0007   [MAR Hold] ceFEPime (MAXIPIME) IV 2 g (01/29/19 0501)   [MAR Hold] metronidazole 500 mg (01/29/19 0238)   PRN Meds: [QIW Hold] sodium chloride, [MAR Hold] acetaminophen **OR** [MAR Hold] acetaminophen, [MAR Hold] iohexol, [MAR Hold] iohexol, lidocaine (PF), [MAR Hold] ondansetron **OR** [MAR Hold] ondansetron (ZOFRAN) IV, [MAR Hold] sodium chloride flush, [MAR Hold] sodium chloride flush   Vital Signs    Vitals:   01/29/19 0300 01/29/19 0447 01/29/19 0657 01/29/19 0805  BP:  93/73  102/78  Pulse:  85    Resp:  20  20  Temp: (!) 97.2 F (36.2 C)  98.5 F (36.9 C) 98.3 F (36.8 C)  TempSrc: Axillary  Oral Oral  SpO2:  93%  96%  Weight:   88.4 kg 88.4 kg  Height:    5\' 10"  (1.778 m)    Intake/Output Summary (Last 24 hours) at 01/29/2019 0813 Last data filed at 01/29/2019 0630 Gross per 24 hour  Intake 310 ml  Output 1521 ml  Net -1211 ml   Last 3 Weights 01/29/2019 01/29/2019 01/28/2019  Weight (lbs) 194 lb 14.2 oz 194 lb 14.2 oz 203 lb 14.8 oz  Weight (kg) 88.4 kg 88.4 kg 92.5 kg     Telemetry    NSR with frequent PVCs and a handful of 3-beat NSVT runs, nothing worse than that - Personally Reviewed  Physical Exam  Per MD: GEN: No acute distress.  HEENT: Normocephalic, atraumatic, sclera non-icteric. Neck:  No JVD or bruits. Cardiac: RRR, faint aortic ejection murmur no diastolic murmurs, rubs, or gallops.  Radials/DP/PT 1+ and equal bilaterally.  Respiratory: Clear to auscultation bilaterally. Breathing is unlabored. GI: mildly tender. Drain RUQ MS: no deformity. Extremities: No clubbing or cyanosis. No edema.  Neuro:  AAOx3. Follows commands. Psych:  Responds to questions appropriately with a normal affect. Poor short term [employer] Recent Labs  Lab 01/27/19 0425 01/28/19 0347 01/29/19 0340  NA 139 134* 136  K 3.0* 3.3* 3.8  CL 105 105 105  CO2 22 20* 26  GLUCOSE 115* 118* 131*  BUN 18 13 12   CREATININE 1.00 0.89 0.95  CALCIUM 8.4* 8.1* 8.4*  PROT 6.1* 6.1* 6.2*  ALBUMIN 2.3* 2.3* 2.3*  AST 52* 33 27  ALT 119* 83* 62*  ALKPHOS 373* 365* 308*  BILITOT 1.7* 1.4* 1.5*  GFRNONAA >60 >60 >60  GFRAA >60 >60 >60  ANIONGAP 12 9 5      Hematology Recent Labs  Lab 01/27/19 0425 01/28/19 0347 01/29/19 0340  WBC 5.6 5.7 6.7  RBC 3.68* 3.87* 3.84*  HGB 11.1* 11.6* 11.9*  HCT 34.2* 35.4* 35.3*  MCV 92.9 91.5 91.9  MCH 30.2 30.0 31.0  MCHC 32.5 32.8 33.7  RDW 15.8* 15.6* 15.6*  PLT 124* 131* 127*    Cardiac Enzymes Recent Labs  Lab 01/24/19 2154  TROPONINI 0.04*   No results for input(s): TROPIPOC in the last 168 hours.    Radiology    Ir Sinus/fist Tube Chk-non Gi  Result Date: 01/28/2019 INDICATION: 85 year old with cholecystostomy tube that was recently replaced. The retention suture has been pulled out of the skin. EXAM: DRAIN INJECTION WITH FLUOROSCOPY COMPARISON:  None. MEDICATIONS: None ANESTHESIA/SEDATION: None COMPLICATIONS: None immediate. TECHNIQUE: Drain was injected with contrast under fluoroscopy. 10 mL Omnipaque 300 was injected. PROCEDURE: The drain and surrounding skin was prepped with chlorhexidine. A new 0-Prolene suture was used to secure the catheter to the skin. FINDINGS: Cholecystostomy tube remains within the gallbladder. Again  noted are multiple filling defects compatible with gallstones. IMPRESSION: Cholecystostomy tube remains in the gallbladder. Drain was secured to skin with new suture. Electronically Signed   By: Markus Daft M.D.   On: 01/28/2019 17:47   Mr 3d Recon At Scanner  Result Date: 01/28/2019 CLINICAL DATA:  85 year old male with history of acute cholecystitis in February 8841, complicated by sepsis and respiratory failure. Status post cholecystostomy drainage. Readmission for lethargy, fever, nausea and leukocytosis with newly increased liver function tests. EXAM: MRI ABDOMEN WITHOUT AND WITH CONTRAST (INCLUDING MRCP) TECHNIQUE: Multiplanar multisequence MR imaging of the abdomen was performed both before and after the administration of intravenous contrast. Heavily T2-weighted images of the biliary and pancreatic ducts were obtained, and three-dimensional MRCP images were rendered by post processing. CONTRAST:  9 mL of Gadavist. COMPARISON:  No prior abdominal MRI. CT the abdomen and pelvis 11/21/2018. FINDINGS: Comment: Today's study is limited by considerable patient motion. Lower chest: Small bilateral pleural effusions lying dependently. Signal void in the region of the aortic root related to prior TAVR procedure. Hepatobiliary: No discrete cystic or solid hepatic lesions are confidently identified on today's motion limited examination. MRCP images are largely nondiagnostic. However, other T2 weighted sequences demonstrate no definite intrahepatic biliary ductal dilatation. A percutaneous cholecystostomy tube is noted. Multiple filling defects within the gallbladder are compatible with multiple gallstones. Gallbladder does not appear overly distended, and there is no large volume of pericholecystic fluid noted at this time. A trace amount of irregular T2 signal intensity is noted adjacent to the gallbladder, likely within normal limits given the indwelling percutaneous cholecystostomy tube. Innumerable filling  defects are also noted within the common bile duct, compatible with choledocholithiasis. Common bile duct appears to measure up to 13 mm distally. Pancreas: Pancreatic atrophy. Multiple well-defined T1 hypointense, T2 hyperintense, nonenhancing lesions are noted throughout the body of the pancreas, measuring up to 1.4 cm (axial image 25 of series 4). No pancreatic ductal dilatation. No peripancreatic fluid collections or inflammatory changes. Spleen:  Unremarkable. Adrenals/Urinary Tract: There are multiple T1 hypointense, T2 hyperintense, nonenhancing lesions in the kidneys bilaterally, compatible with simple cysts, largest of which is exophytic in the posterior aspect of the upper pole of the right kidney measuring 5 cm. No hydroureteronephrosis in the visualized portions of the abdomen. Bilateral adrenal glands are normal in appearance. Stomach/Bowel: Visualized portions are unremarkable. Vascular/Lymphatic: Aortic atherosclerosis with fusiform aneurysmal dilatation of the infrarenal abdominal aorta which measures up to 4.2 x 5.2 cm. No lymphadenopathy identified in the abdomen. Other: No significant volume of ascites noted in the visualized portions of the peritoneal cavity. Musculoskeletal: No aggressive appearing lytic or blastic lesions noted in the visualized portions of the skeleton. IMPRESSION: 1. Choledocholithiasis with dilated common bile duct measuring up to 13  mm in the porta hepatis. Despite the presence of numerous stones in this mildly dilated duct, there is no intrahepatic biliary ductal dilatation noted at this time. 2. Cholelithiasis with percutaneous cholecystostomy tube in place. Minimal surrounding T2 hyperintensity adjacent to the gallbladder which is likely within normal limits for a patient with an indwelling percutaneous cholecystostomy tube. 3. Aortic atherosclerosis with fusiform dilatation of the infrarenal abdominal aorta. 4. Small bilateral pleural effusions lying dependently.  Electronically Signed   By: Vinnie Langton M.D.   On: 01/28/2019 08:04   Mr Abdomen Mrcp Moise Boring Contast  Result Date: 01/28/2019 CLINICAL DATA:  85 year old male with history of acute cholecystitis in February 0258, complicated by sepsis and respiratory failure. Status post cholecystostomy drainage. Readmission for lethargy, fever, nausea and leukocytosis with newly increased liver function tests. EXAM: MRI ABDOMEN WITHOUT AND WITH CONTRAST (INCLUDING MRCP) TECHNIQUE: Multiplanar multisequence MR imaging of the abdomen was performed both before and after the administration of intravenous contrast. Heavily T2-weighted images of the biliary and pancreatic ducts were obtained, and three-dimensional MRCP images were rendered by post processing. CONTRAST:  9 mL of Gadavist. COMPARISON:  No prior abdominal MRI. CT the abdomen and pelvis 11/21/2018. FINDINGS: Comment: Today's study is limited by considerable patient motion. Lower chest: Small bilateral pleural effusions lying dependently. Signal void in the region of the aortic root related to prior TAVR procedure. Hepatobiliary: No discrete cystic or solid hepatic lesions are confidently identified on today's motion limited examination. MRCP images are largely nondiagnostic. However, other T2 weighted sequences demonstrate no definite intrahepatic biliary ductal dilatation. A percutaneous cholecystostomy tube is noted. Multiple filling defects within the gallbladder are compatible with multiple gallstones. Gallbladder does not appear overly distended, and there is no large volume of pericholecystic fluid noted at this time. A trace amount of irregular T2 signal intensity is noted adjacent to the gallbladder, likely within normal limits given the indwelling percutaneous cholecystostomy tube. Innumerable filling defects are also noted within the common bile duct, compatible with choledocholithiasis. Common bile duct appears to measure up to 13 mm distally. Pancreas:  Pancreatic atrophy. Multiple well-defined T1 hypointense, T2 hyperintense, nonenhancing lesions are noted throughout the body of the pancreas, measuring up to 1.4 cm (axial image 25 of series 4). No pancreatic ductal dilatation. No peripancreatic fluid collections or inflammatory changes. Spleen:  Unremarkable. Adrenals/Urinary Tract: There are multiple T1 hypointense, T2 hyperintense, nonenhancing lesions in the kidneys bilaterally, compatible with simple cysts, largest of which is exophytic in the posterior aspect of the upper pole of the right kidney measuring 5 cm. No hydroureteronephrosis in the visualized portions of the abdomen. Bilateral adrenal glands are normal in appearance. Stomach/Bowel: Visualized portions are unremarkable. Vascular/Lymphatic: Aortic atherosclerosis with fusiform aneurysmal dilatation of the infrarenal abdominal aorta which measures up to 4.2 x 5.2 cm. No lymphadenopathy identified in the abdomen. Other: No significant volume of ascites noted in the visualized portions of the peritoneal cavity. Musculoskeletal: No aggressive appearing lytic or blastic lesions noted in the visualized portions of the skeleton. IMPRESSION: 1. Choledocholithiasis with dilated common bile duct measuring up to 13 mm in the porta hepatis. Despite the presence of numerous stones in this mildly dilated duct, there is no intrahepatic biliary ductal dilatation noted at this time. 2. Cholelithiasis with percutaneous cholecystostomy tube in place. Minimal surrounding T2 hyperintensity adjacent to the gallbladder which is likely within normal limits for a patient with an indwelling percutaneous cholecystostomy tube. 3. Aortic atherosclerosis with fusiform dilatation of the infrarenal abdominal aorta. 4. Small  bilateral pleural effusions lying dependently. Electronically Signed   By: Vinnie Langton M.D.   On: 01/28/2019 08:04    Patient Profile     85 y.o. male with history of severe aortic stenosis s/p TAVR  in 27/04/8241, chronic systolic CHF/non-ischemic cardiomyopathy with history of ventricular tachycardia s/p AICD which has been removed due to infection (admission 11/12 with ESBL bacteremia nad Klebsiella lead vegetation), moderate CAD by cath 06/2018, 4.2cm x 5.2cm infrarenal AAA (sized by imaging this admission), dementia, obstructive sleep apnea, hyperlipidemia, severe deconditioning, degenerative arthritis/lumbar vertebral fracture with reduced mobility, cholecystitis and cholangitis with biliary drain in place. He was admitted 11/2018 with severe sepsis secondary to cholecystitis and Enterobacter bacteremia and acute respiratory failure requiring intubation and had perc drain placed. He was readmitted 01/24/19 with AMS and found to have severe biliary sepsis. He was initially felt to be a poor candidate for surgery but ID felt that without intervention he is at risk for recurrent infection and sepsis. Drain was found to be partially obstructed. ERCP scheduled for 4/17.  Assessment & Plan    1. Recurrent sepsis in setting of biliary disease - cardiology asked to weigh in yesterday for pre-op clearance. No easy answer. Risk of surgery is high but risk of avoidance of surgery is also high due to likelihood of recurrent infections. Dr. Sallyanne Kuster had conversation with wife yesterday about outcomes and they collaboratively came to the conclusion he would be cleared to proceed under the premise she believes he himself would've preferred to take the risk of surgery. Therefore, no specific further cardiac testing planned at this time.  2. NICM/chronic systolic CHF - recommend following volume status carefully. Can consider readding BB as BP allows. Otherwise BP likely too low to support any additional med titration.  3. Ventricular tachycardia - remains on low dose amiodarone at this time.   4. Severe AS s/p TAVR - TEE 11/2018 showed normal AV prosthesis. OP f/u with structural team.. Of note he was on Plavix PTA  with plan to continue through 02/2019. Otherwise has moderate CAD on cath. Will ask MD to comment on long-term plan to resume post-surgically short term or assume course has been completed.  5. Abnormal LFTs - improving.  6. Mild anemia/thrombocytopenia - appears relatively stable last several days.  7. Hypokalemia - improved.  8. AAA - 4.2cm x 5.2cm imaged this admission. May not be appropriate to further follow given advanced age and comorbidities.  Patient remains full code per EMR. Given his comorbidities and advanced age with progressive decline this year would consider ongoing conversations surrounding goals of care.  For questions or updates, please contact [place] Please consult [email or web address] for contact info under Cardiology/STEMI.  Signed, Charlie Pitter, PA-C 01/29/2019, 8:13 AM     I have seen and examined the patient along with Charlie Pitter, PA-C.  I have reviewed the chart, notes and new data.  I agree with PA/NP's note.  Key new complaints: seen in endoscopy just before ERCP; no CV complaints Key examination changes: appears chronically ill and debilitated, but able to lie fully flat without dyspnea. No clinical signs of hypervolemia. Key new findings / data: PVCs and only 3-beat runs of NSVT on monitor  PLAN: For ERCP today. Hopefully, this will allow improvement. Cholecystectomy (which will probably require open laparotomy rather than laparoscopic approach due to previous colectomy) will be a higher risk endeavor. No plan to resume clopidogrel - we would have stopped it in a couple of weeks. Continue  amiodarone, low dose. Attempts to discontinue this have uniformly led to worsening VT, but arrhythmia is well controlled on a very low dose of antiarrhythmic. Plan to resume beta blocker gradually as allowed by BP.  Sanda Klein, MD, [place] [phone] 01/29/2019, 10:55 AM

## 2019-01-29 NOTE — Anesthesia Postprocedure Evaluation (Signed)
Anesthesia Post Note  Patient: William [name]  Procedure(s) Performed: ENDOSCOPIC RETROGRADE CHOLANGIOPANCREATOGRAPHY (ERCP) (N/A ) SPHINCTEROTOMY REMOVAL OF STONES     Patient location during evaluation: PACU Anesthesia Type: General Level of consciousness: awake Pain management: pain level controlled Vital Signs Assessment: post-procedure vital signs reviewed and stable Respiratory status: spontaneous breathing Cardiovascular status: stable Postop Assessment: no apparent nausea or vomiting    Last Vitals:  Vitals:   01/29/19 1150 01/29/19 1231  BP: (!) 103/56 102/71  Pulse: (!) 107 (!) 103  Resp: 19 17  Temp:  36.6 C  SpO2: 95% 96%    Last Pain:  Vitals:   01/29/19 1231  TempSrc: Axillary  PainSc:                  [name]

## 2019-01-29 NOTE — Progress Notes (Signed)
PROGRESS NOTE    William [name]  HBZ:[id number] DOB: Oct 05, 1928 DOA: 01/24/2019 PCP: Gayland Curry, DO   Brief Narrative:  85 y.o. WM PMHx Dementia, AAA, aortic stenosis status post TAVR, chronic systolic CHF pulmonary nodules, chronic systolic CHF, history of VT with AICD that was removed due to infection, cholecystitis and cholangitis with biliary drain in place, debility at SNF, now presenting to the emergency department for evaluation of lethargy, fevers, and vomiting.  Patient is unable to contribute much to the history due to his clinical condition.  Per report of the patient's daughter and nursing facility personnel, there has been decreasing output from the patient's biliary drain and he has developed nausea over the past couple days with very poor intake, progressing to lethargy today.  He was reportedly pale, slumped over, and vomiting some brownish material.  He was noted to have a fever to 100.7 F, tachycardic in the 130s, and blood pressure stable initially.  He has not been coughing and has not appeared dyspneic or requiring any oxygen per report.  He was sent into the ED for evaluation of his change in condition.   ED Course: Upon arrival to the ED, patient is found to be febrile to 38.2 C, saturating well on room air, tachycardic in the 120s, initial blood pressure 83 systolic, and normal respiratory rate.  EKG features a sinus tachycardia with chronic LBBB.  Chest x-ray is notable for patchy left basilar atelectasis versus early infiltrate.  Chemistry panel notable for sodium 133, glucose 255, alkaline phosphatase 595, AST and ALT in the 300 range, and total bilirubin of 4.  CBC features a leukocytosis to 11,100 and a slight thrombocytopenia.  Lactic acid is elevated to 2.7.  Blood cultures were collected in the ED, 250 cc normal saline bolus was given, and the patient was treated with vancomycin and Zosyn.  General surgery was consulted by the ED clinician, recommended consultation  with GI, ED PA reports that she is awaiting for callback from GI at this time and requests a medical admission.    Subjective: Patient seen after he returned from his ERCP.  Patient still sedated.  His wife is at the bedside.    Assessment & Plan:   Principal Problem:   Biliary sepsis Active Problems:   Chronic systolic CHF (congestive heart failure) (HCC)   Ventricular tachycardia (HCC)   Dementia (HCC)   Coronary artery disease involving native coronary artery of native heart without angina pectoris   Hyperglycemia   Acute metabolic encephalopathy   Transaminitis   Dementia without behavioral disturbance (HCC)   Goals of care, counseling/discussion   Palliative care by specialist   Choledocholithiasis   Severe sepsis - Upon admission patient met guidelines for sepsis temp> 30 C, HR> 90, RR> 20 and site of infection occluded biliary drain. -positive Enterobacter from cholecystostomy tube -Most likely secondary to occluded biliary drain.  Cholecystostomy tube was exchanged on 4/13.  See below. - Continue broad-spectrum antibiotics per ID.  Currently on cefepime and metronidazole.  Sepsis physiology has resolved.  Cholelithiasis/occluded cholecystostomy tube - 4/13 cholecystostomy tube exchanged - Numerous stones removed along with purulent drainage which was sent for culture. - Cystic duct occluded. Patient's alkaline phosphatase continues to increase.  MRCP was done which showed revealed choledocholithiasis.  Patient underwent ERCP this morning with sphincterotomy. Discussed in detail with Dr. Redmond Pulling with general surgery yesterday.  Cardiology was consulted for risk stratification.  Patient considered moderate to severe risk of cardiac complications if he undergoes  cholecystectomy.  All of this has been discussed in detail with patient's family members including his wife as well as daughter.  Plan is to revisit this on Monday.  Continue current treatment for now.  Acute  encephalopathy Patient pleasantly confused.  No focal neurological deficits.  He does tend to get agitated at times.   Chronic systolic CHF/aortic stenosis status post TAVR -EF 25 to 30% with diffuse hypokinesis, mild to moderate MR, TAVR -Echocardiogram February 2020: EF 25-30% with diffuse hypokinesis, mild-moderate MR, TAVR, negative endocarditis.Marland Kitchen  Plavix is on hold. Currently stable from a cardiac standpoint.  CAD Minimal CAD was noted on cardiac catheterization done in September 2019. Chronic LBBB noted on EKG.  Plavix is on hold.  OSA - CPAP per respiratory  History of ventricular tachycardia - Patient had AICD but was removed December 2019 due to infection.  Occasional episodes of nonsustained VT noted.  Potassium 3.8 today.  Magnesium 2.0.  We will give additional dose of potassium later today.   Hyperglycemia - Serum glucose in the ED 255.  CBGs have been reasonably well controlled subsequently. - Hemoglobin A1c 5.7 -LDL 100 - sensitive SSI  Dementia - Pleasantly confused.  Occasional agitation noted.  Improved when his wife is at bedside.    Goals of care Seen by palliative medicine.  Full scope of treatment to continue.  DVT prophylaxis: SCD Code Status: Full Family Communication: Discussed with patient's daughter yesterday and with his wife today.   Disposition Plan: Unclear for now.  Possibly may need to go to skilled nursing facility when medically ready for discharge.  May end up undergoing cholecystectomy early next week.  Will stay here in the hospital through the weekend.   Consultants:  GI [facility]CS ID    Procedures/Significant Events:  4/12 PCXR: LEFT basilar atelectasis/early infiltrate 4/12 US abdomen RUQ:-Mild gallbladder wall thickening, probably chronic sequelae of cholecystitis and cholecystostomy tube.  -Cholelithiasis again noted -No intra-/extrahepatic biliary ductal dilation noted  4/13 cholecystostomy tube exchange with  fluoroscopy:-Innumerable gallstones. Unable to opacify the common bile duct due to obstruction of the cystic duct. -Purulent looking fluid removed from the gallbladder. Fluid sent for culture. 4/16: Drain check and placement of new suture 4/17 ERCP: - Choledocholithiasis, treated with biliary sphincterotomy and balloon sweeping. Cystic duct did not fill with contrast, suggesting continued obstruction.   Cultures 4/12 blood RIGHT antecubital NGTD 4/12 blood counts right hand NGTD  4/13 gallbladder positive Enterobacter    Antimicrobials: Anti-infectives (From admission, onward)   Start     Stop   01/25/19 2200  vancomycin (VANCOCIN) 1,500 mg in sodium chloride 0.9 % 500 mL IVPB  Status:  Discontinued     01/26/19 1124   01/25/19 0200  ceFEPIme (MAXIPIME) 2 g in sodium chloride 0.9 % 100 mL IVPB         01/24/19 2200  ceFEPIme (MAXIPIME) 2 g in sodium chloride 0.9 % 100 mL IVPB  Status:  Discontinued     01/24/19 2208   01/24/19 2200  metroNIDAZOLE (FLAGYL) IVPB 500 mg         01/24/19 1945  piperacillin-tazobactam (ZOSYN) IVPB 3.375 g     01/24/19 2017   01/24/19 1930  ceFEPIme (MAXIPIME) 2 g in sodium chloride 0.9 % 100 mL IVPB  Status:  Discontinued     01/24/19 2010   01/24/19 1930  metroNIDAZOLE (FLAGYL) IVPB 500 mg  Status:  Discontinued     01/24/19 1931   01/24/19 1930  vancomycin (VANCOCIN) IVPB 1000 mg/200  mL premix  Status:  Discontinued     01/24/19 1922   01/24/19 1930  vancomycin (VANCOCIN) 2,000 mg in sodium chloride 0.9 % 500 mL IVPB     01/25/19 0012       LINES / TUBES:  Cholecystostomy tube/13>>    Continuous Infusions:  sodium chloride 10 mL/hr at 01/25/19 0007   ceFEPime (MAXIPIME) IV 2 g (01/29/19 0501)   metronidazole 500 mg (01/29/19 0238)     Objective: Vitals:   01/29/19 1100 01/29/19 1110 01/29/19 1120 01/29/19 1231  BP: (!) 101/55 (!) 96/55 91/60 102/71  Pulse: (!) 114 (!) 110 (!) 110 (!) 103  Resp: (!) 22 (!) _0 Temp:    97.8  F (36.6 C)  TempSrc:    Axillary  SpO2: 96% 96% 95% 96%  Weight:      Height:        Intake/Output Summary (Last 24 hours) at 01/29/2019 1243 Last data filed at 01/29/2019 1044 Gross per 24 hour  Intake 1360 ml  Output 1526 ml  Net -166 ml   Filed Weights   01/28/19 0425 01/29/19 0657 01/29/19 0805  Weight: 92.5 kg 88.4 kg 88.4 kg   General appearance: Patient sedated from recent ERCP.  Vital signs noted to be stable. Resp: Clear to auscultation bilaterally.  Normal effort Cardio: S1-S2 is normal regular.  No S3-S4.  No rubs murmurs or bruit GI: Abdomen is soft.  Nontender nondistended.  Bowel sounds are present normal.  No masses organomegaly.  Biliary drain noted on the right. Extremities: No edema.  Full range of motion of lower extremities. Neurologic: Alert and oriented x3.  No focal neurological deficits.    CBC: Recent Labs  Lab 01/24/19 1935 01/25/19 0305 01/26/19 0257 01/27/19 0425 01/28/19 0347 01/29/19 0340  WBC 11.1* 10.6* 7.7 5.6 5.7 6.7  NEUTROABS 10.0* 9.0*  --   --   --   --   HGB 12.8* 12.0* 11.8* 11.1* 11.6* 11.9*  HCT 36.6* 35.9* 35.9* 34.2* 35.4* 35.3*  MCV 89.9 89.8 91.8 92.9 91.5 91.9  PLT 146* 132* 123* 124* 131* 194*   Basic Metabolic Panel: Recent Labs  Lab 01/25/19 0305 01/26/19 0257 01/27/19 0425 01/28/19 0347 01/29/19 0340  NA 138 140 139 134* 136  K 3.7 4.5 3.0* 3.3* 3.8  CL 103 107 105 105 105  CO2 _1 20* 26  GLUCOSE 151* 115* 115* 118* 131*  BUN _2 CREATININE 1.19 1.15 1.00 0.89 0.95  CALCIUM 8.5* 8.6* 8.4* 8.1* 8.4*  MG  --  2.2 2.1 2.0 2.0   GFR: Estimated Creatinine Clearance: 59.1 mL/min (by C-G formula based on SCr of 0.95 mg/dL). Liver Function Tests: Recent Labs  Lab 01/24/19 1935 01/25/19 0305 01/27/19 0425 01/28/19 0347 01/29/19 0340  AST 303* 252* 52* 33 27  ALT 267* 263* 119* 83* 62*  ALKPHOS 595* 549* 373* 365* 308*  BILITOT 4.0* 4.9* 1.7* 1.4* 1.5*  PROT 6.8 6.4* 6.1* 6.1* 6.2*    ALBUMIN 2.7* 2.5* 2.3* 2.3* 2.3*   Recent Labs  Lab 01/24/19 1935  LIPASE 51   Recent Labs  Lab 01/24/19 2317  AMMONIA 26   Coagulation Profile: Recent Labs  Lab 01/24/19 2218  INR 1.2   Cardiac Enzymes: Recent Labs  Lab 01/24/19 2154  TROPONINI 0.04*   HbA1C: Recent Labs    01/27/19 0425  HGBA1C 5.7*   CBG: Recent Labs  Lab 01/28/19 1717 01/28/19 2035 01/29/19 0037  01/29/19 0439 01/29/19 0744  GLUCAP 179* 134* 112* 126* 126*   Lipid Profile: Recent Labs    01/27/19 0425  CHOL 140  HDL 22*  LDLCALC 100*  TRIG 91  CHOLHDL 6.4   Urine analysis:    Component Value Date/Time   COLORURINE AMBER (A) 01/24/2019 2138   APPEARANCEUR HAZY (A) 01/24/2019 2138   LABSPEC 1.018 01/24/2019 2138   PHURINE 5.0 01/24/2019 2138   GLUCOSEU 50 (A) 01/24/2019 2138   HGBUR SMALL (A) 01/24/2019 2138   BILIRUBINUR SMALL (A) 01/24/2019 2138   KETONESUR NEGATIVE 01/24/2019 2138   PROTEINUR NEGATIVE 01/24/2019 2138   NITRITE NEGATIVE 01/24/2019 2138   LEUKOCYTESUR LARGE (A) 01/24/2019 2138    Recent Results (from the past 240 hour(s))  Blood Culture (routine x 2)     Status: None   Collection Time: 01/24/19  7:15 PM  Result Value Ref Range Status   Specimen Description BLOOD RIGHT ANTECUBITAL  Final   Special Requests   Final    BOTTLES DRAWN AEROBIC AND ANAEROBIC Blood Culture results may not be optimal due to an inadequate volume of blood received in culture bottles   Culture   Final    NO GROWTH 5 DAYS Performed at [facility], [place] [address], [place], [place] [address]    Report Status 01/29/2019 FINAL  Final  Blood Culture (routine x 2)     Status: None   Collection Time: 01/24/19  7:36 PM  Result Value Ref Range Status   Specimen Description BLOOD RIGHT HAND  Final   Special Requests   Final    BOTTLES DRAWN AEROBIC AND ANAEROBIC Blood Culture results may not be optimal due to an inadequate volume of blood received in culture bottles   Culture    Final    NO GROWTH 5 DAYS Performed at [facility], [place] [address], [place], [place] [address]    Report Status 01/29/2019 FINAL  Final  Aerobic/Anaerobic Culture (surgical/deep wound)     Status: None (Preliminary result)   Collection Time: 01/25/19  2:57 PM  Result Value Ref Range Status   Specimen Description GALL BLADDER  Final   Special Requests NONE  Final   Gram Stain   Final    ABUNDANT WBC PRESENT, PREDOMINANTLY PMN MODERATE GRAM NEGATIVE RODS RARE GRAM POSITIVE COCCI    Culture   Final    ABUNDANT ENTEROBACTER HORMAECHEI NO ANAEROBES ISOLATED; CULTURE IN PROGRESS FOR 5 DAYS    Report Status PENDING  Incomplete   Organism ID, Bacteria ENTEROBACTER HORMAECHEI  Final      Susceptibility   Enterobacter hormaechei - MIC*    CEFAZOLIN >=64 RESISTANT Resistant     CEFEPIME <=1 SENSITIVE Sensitive     CEFTAZIDIME >=64 RESISTANT Resistant     CEFTRIAXONE >=64 RESISTANT Resistant     CIPROFLOXACIN 0.5 SENSITIVE Sensitive     GENTAMICIN <=1 SENSITIVE Sensitive     IMIPENEM 0.5 SENSITIVE Sensitive     TRIMETH/SULFA <=20 SENSITIVE Sensitive     PIP/TAZO Value in next row Resistant      >=128 RESISTANTPerformed at [facility], [address], [place], [place] [address]    * ABUNDANT ENTEROBACTER HORMAECHEI         Radiology Studies: Ir Sinus/fist Tube Chk-non Gi  Result Date: 01/28/2019 INDICATION: 85 year old with cholecystostomy tube that was recently replaced. The retention suture has been pulled out of the skin. EXAM: DRAIN INJECTION WITH FLUOROSCOPY COMPARISON:  None. MEDICATIONS: None ANESTHESIA/SEDATION:  None COMPLICATIONS: None immediate. TECHNIQUE: Drain was injected with contrast under fluoroscopy. 10 mL Omnipaque 300 was injected. PROCEDURE: The drain and surrounding skin was prepped with chlorhexidine. A new 0-Prolene suture was used to secure the catheter to the skin. FINDINGS: Cholecystostomy tube remains within the gallbladder. Again noted are  multiple filling defects compatible with gallstones. IMPRESSION: Cholecystostomy tube remains in the gallbladder. Drain was secured to skin with new suture. Electronically Signed   By: Markus Daft M.D.   On: 01/28/2019 17:47   Mr 3d Recon At Scanner  Result Date: 01/28/2019 CLINICAL DATA:  85 year old male with history of acute cholecystitis in February 3710, complicated by sepsis and respiratory failure. Status post cholecystostomy drainage. Readmission for lethargy, fever, nausea and leukocytosis with newly increased liver function tests. EXAM: MRI ABDOMEN WITHOUT AND WITH CONTRAST (INCLUDING MRCP) TECHNIQUE: Multiplanar multisequence MR imaging of the abdomen was performed both before and after the administration of intravenous contrast. Heavily T2-weighted images of the biliary and pancreatic ducts were obtained, and three-dimensional MRCP images were rendered by post processing. CONTRAST:  9 mL of Gadavist. COMPARISON:  No prior abdominal MRI. CT the abdomen and pelvis 11/21/2018. FINDINGS: Comment: Today's study is limited by considerable patient motion. Lower chest: Small bilateral pleural effusions lying dependently. Signal void in the region of the aortic root related to prior TAVR procedure. Hepatobiliary: No discrete cystic or solid hepatic lesions are confidently identified on today's motion limited examination. MRCP images are largely nondiagnostic. However, other T2 weighted sequences demonstrate no definite intrahepatic biliary ductal dilatation. A percutaneous cholecystostomy tube is noted. Multiple filling defects within the gallbladder are compatible with multiple gallstones. Gallbladder does not appear overly distended, and there is no large volume of pericholecystic fluid noted at this time. A trace amount of irregular T2 signal intensity is noted adjacent to the gallbladder, likely within normal limits given the indwelling percutaneous cholecystostomy tube. Innumerable filling defects are  also noted within the common bile duct, compatible with choledocholithiasis. Common bile duct appears to measure up to 13 mm distally. Pancreas: Pancreatic atrophy. Multiple well-defined T1 hypointense, T2 hyperintense, nonenhancing lesions are noted throughout the body of the pancreas, measuring up to 1.4 cm (axial image 25 of series 4). No pancreatic ductal dilatation. No peripancreatic fluid collections or inflammatory changes. Spleen:  Unremarkable. Adrenals/Urinary Tract: There are multiple T1 hypointense, T2 hyperintense, nonenhancing lesions in the kidneys bilaterally, compatible with simple cysts, largest of which is exophytic in the posterior aspect of the upper pole of the right kidney measuring 5 cm. No hydroureteronephrosis in the visualized portions of the abdomen. Bilateral adrenal glands are normal in appearance. Stomach/Bowel: Visualized portions are unremarkable. Vascular/Lymphatic: Aortic atherosclerosis with fusiform aneurysmal dilatation of the infrarenal abdominal aorta which measures up to 4.2 x 5.2 cm. No lymphadenopathy identified in the abdomen. Other: No significant volume of ascites noted in the visualized portions of the peritoneal cavity. Musculoskeletal: No aggressive appearing lytic or blastic lesions noted in the visualized portions of the skeleton. IMPRESSION: 1. Choledocholithiasis with dilated common bile duct measuring up to 13 mm in the porta hepatis. Despite the presence of numerous stones in this mildly dilated duct, there is no intrahepatic biliary ductal dilatation noted at this time. 2. Cholelithiasis with percutaneous cholecystostomy tube in place. Minimal surrounding T2 hyperintensity adjacent to the gallbladder which is likely within normal limits for a patient with an indwelling percutaneous cholecystostomy tube. 3. Aortic atherosclerosis with fusiform dilatation of the infrarenal abdominal aorta. 4. Small bilateral pleural effusions lying dependently.  Electronically  Signed   By: Vinnie Langton M.D.   On: 01/28/2019 08:04   Dg Ercp Biliary & Pancreatic Ducts  Result Date: 01/29/2019 CLINICAL DATA:  85 year old male with choledocholithiasis undergoing ERCP. EXAM: ERCP TECHNIQUE: Multiple spot images obtained with the fluoroscopic device and submitted for interpretation post-procedure. FLUOROSCOPY TIME:  Fluoroscopy Time:  3 minutes 14 seconds. COMPARISON:  MRCP 01/27/2019. FINDINGS: A total of 5 intraoperative spot views are submitted for review. The images demonstrate a flexible endoscope in the descending duodenum followed by wire cannulation of the common bile duct and cholangiogram. Multiple filling defects are present throughout the common bile duct consistent with choledocholithiasis. Subsequent images demonstrate sphincterotomy and balloon sweeping of the common duct. A pigtail drainage catheter is also evident in the right upper quadrant consistent with the recently placed percutaneous cholecystostomy tube. IMPRESSION: ERCP with sphincterotomy and balloon sweep of the common duct. These images were submitted for radiologic interpretation only. Please see the procedural report for the amount of contrast and the fluoroscopy time utilized. Electronically Signed   By: Jacqulynn Cadet M.D.   On: 01/29/2019 11:27   Mr Abdomen Mrcp Moise Boring Contast  Result Date: 01/28/2019 CLINICAL DATA:  85 year old male with history of acute cholecystitis in February 5456, complicated by sepsis and respiratory failure. Status post cholecystostomy drainage. Readmission for lethargy, fever, nausea and leukocytosis with newly increased liver function tests. EXAM: MRI ABDOMEN WITHOUT AND WITH CONTRAST (INCLUDING MRCP) TECHNIQUE: Multiplanar multisequence MR imaging of the abdomen was performed both before and after the administration of intravenous contrast. Heavily T2-weighted images of the biliary and pancreatic ducts were obtained, and three-dimensional MRCP images were rendered by post  processing. CONTRAST:  9 mL of Gadavist. COMPARISON:  No prior abdominal MRI. CT the abdomen and pelvis 11/21/2018. FINDINGS: Comment: Today's study is limited by considerable patient motion. Lower chest: Small bilateral pleural effusions lying dependently. Signal void in the region of the aortic root related to prior TAVR procedure. Hepatobiliary: No discrete cystic or solid hepatic lesions are confidently identified on today's motion limited examination. MRCP images are largely nondiagnostic. However, other T2 weighted sequences demonstrate no definite intrahepatic biliary ductal dilatation. A percutaneous cholecystostomy tube is noted. Multiple filling defects within the gallbladder are compatible with multiple gallstones. Gallbladder does not appear overly distended, and there is no large volume of pericholecystic fluid noted at this time. A trace amount of irregular T2 signal intensity is noted adjacent to the gallbladder, likely within normal limits given the indwelling percutaneous cholecystostomy tube. Innumerable filling defects are also noted within the common bile duct, compatible with choledocholithiasis. Common bile duct appears to measure up to 13 mm distally. Pancreas: Pancreatic atrophy. Multiple well-defined T1 hypointense, T2 hyperintense, nonenhancing lesions are noted throughout the body of the pancreas, measuring up to 1.4 cm (axial image 25 of series 4). No pancreatic ductal dilatation. No peripancreatic fluid collections or inflammatory changes. Spleen:  Unremarkable. Adrenals/Urinary Tract: There are multiple T1 hypointense, T2 hyperintense, nonenhancing lesions in the kidneys bilaterally, compatible with simple cysts, largest of which is exophytic in the posterior aspect of the upper pole of the right kidney measuring 5 cm. No hydroureteronephrosis in the visualized portions of the abdomen. Bilateral adrenal glands are normal in appearance. Stomach/Bowel: Visualized portions are  unremarkable. Vascular/Lymphatic: Aortic atherosclerosis with fusiform aneurysmal dilatation of the infrarenal abdominal aorta which measures up to 4.2 x 5.2 cm. No lymphadenopathy identified in the abdomen. Other: No significant volume of ascites noted in the visualized portions of the  peritoneal cavity. Musculoskeletal: No aggressive appearing lytic or blastic lesions noted in the visualized portions of the skeleton. IMPRESSION: 1. Choledocholithiasis with dilated common bile duct measuring up to 13 mm in the porta hepatis. Despite the presence of numerous stones in this mildly dilated duct, there is no intrahepatic biliary ductal dilatation noted at this time. 2. Cholelithiasis with percutaneous cholecystostomy tube in place. Minimal surrounding T2 hyperintensity adjacent to the gallbladder which is likely within normal limits for a patient with an indwelling percutaneous cholecystostomy tube. 3. Aortic atherosclerosis with fusiform dilatation of the infrarenal abdominal aorta. 4. Small bilateral pleural effusions lying dependently. Electronically Signed   By: Vinnie Langton M.D.   On: 01/28/2019 08:04        Scheduled Meds:  amiodarone  100 mg Oral Daily   insulin aspart  0-9 Units Subcutaneous Q4H   pantoprazole (PROTONIX) IV  40 mg Intravenous Q24H   sodium chloride flush  3 mL Intravenous Q12H   sodium chloride flush  3 mL Intravenous Q12H   sodium chloride flush  5 mL Intracatheter Q8H   Continuous Infusions:  sodium chloride 10 mL/hr at 01/25/19 0007   ceFEPime (MAXIPIME) IV 2 g (01/29/19 0501)   metronidazole 500 mg (01/29/19 0238)     LOS: 5 days     Bonnielee Haff, MD [facility]   If 7PM-7AM, please contact night-coverage [email or web address] Password [facility] 01/29/2019, 12:43 PM

## 2019-01-29 NOTE — Progress Notes (Signed)
Day of Surgery  Subjective: CC: No complaints Patient denies any abdominal pain. Still somewhat sedated after ERCP today. Wife is at bedside. ,  Objective: Vital signs in last 24 hours: Temp:  [97.2 F (36.2 C)-98.5 F (36.9 C)] 97.8 F (36.6 C) (04/17 1231) Pulse Rate:  [85-120] 103 (04/17 1231) Resp:  [13-25] 17 (04/17 1231) BP: (89-104)/(55-78) 102/71 (04/17 1231) SpO2:  [93 %-97 %] 96 % (04/17 1231) Weight:  [88.4 kg] 88.4 kg (04/17 0805)    Intake/Output from previous day: 04/16 0701 - 04/17 0700 In: 310 [IV Piggyback:300] Out: 3536 [Urine:1450; Drains:71] Intake/Output this shift: Total I/O In: 1050 [I.V.:800; IV Piggyback:250] Out: 5 [Blood:5]  PE: Gen: Sedated but awakes to verbal commands, NAD Lungs: Normal rate and effort Abd: Soft, ND, NT, +BS Msk: no edema   Lab Results:  Recent Labs    01/28/19 0347 01/29/19 0340  WBC 5.7 6.7  HGB 11.6* 11.9*  HCT 35.4* 35.3*  PLT 131* 127*   BMET Recent Labs    01/28/19 0347 01/29/19 0340  NA 134* 136  K 3.3* 3.8  CL 105 105  CO2 20* 26  GLUCOSE 118* 131*  BUN 13 12  CREATININE 0.89 0.95  CALCIUM 8.1* 8.4*   PT/INR No results for input(s): LABPROT, INR in the last 72 hours. CMP     Component Value Date/Time   NA 136 01/29/2019 0340   NA 132 (A) 01/10/2019   K 3.8 01/29/2019 0340   CL 105 01/29/2019 0340   CO2 26 01/29/2019 0340   GLUCOSE 131 (H) 01/29/2019 0340   BUN 12 01/29/2019 0340   BUN 26 (A) 01/10/2019   CREATININE 0.95 01/29/2019 0340   CREATININE 1.12 (H) 02/05/2017 1103   CALCIUM 8.4 (L) 01/29/2019 0340   PROT 6.2 (L) 01/29/2019 0340   ALBUMIN 2.3 (L) 01/29/2019 0340   AST 27 01/29/2019 0340   ALT 62 (H) 01/29/2019 0340   ALKPHOS 308 (H) 01/29/2019 0340   BILITOT 1.5 (H) 01/29/2019 0340   GFRNONAA >60 01/29/2019 0340   GFRAA >60 01/29/2019 0340   Lipase     Component Value Date/Time   LIPASE 51 01/24/2019 1935       Studies/Results: Ir Sinus/fist Tube Chk-non  Gi  Result Date: 01/28/2019 INDICATION: 85 year old with cholecystostomy tube that was recently replaced. The retention suture has been pulled out of the skin. EXAM: DRAIN INJECTION WITH FLUOROSCOPY COMPARISON:  None. MEDICATIONS: None ANESTHESIA/SEDATION: None COMPLICATIONS: None immediate. TECHNIQUE: Drain was injected with contrast under fluoroscopy. 10 mL Omnipaque 300 was injected. PROCEDURE: The drain and surrounding skin was prepped with chlorhexidine. A new 0-Prolene suture was used to secure the catheter to the skin. FINDINGS: Cholecystostomy tube remains within the gallbladder. Again noted are multiple filling defects compatible with gallstones. IMPRESSION: Cholecystostomy tube remains in the gallbladder. Drain was secured to skin with new suture. Electronically Signed   By: Markus Daft M.D.   On: 01/28/2019 17:47   Mr 3d Recon At Scanner  Result Date: 01/28/2019 CLINICAL DATA:  85 year old male with history of acute cholecystitis in February 1443, complicated by sepsis and respiratory failure. Status post cholecystostomy drainage. Readmission for lethargy, fever, nausea and leukocytosis with newly increased liver function tests. EXAM: MRI ABDOMEN WITHOUT AND WITH CONTRAST (INCLUDING MRCP) TECHNIQUE: Multiplanar multisequence MR imaging of the abdomen was performed both before and after the administration of intravenous contrast. Heavily T2-weighted images of the biliary and pancreatic ducts were obtained, and three-dimensional MRCP images were rendered by  post processing. CONTRAST:  9 mL of Gadavist. COMPARISON:  No prior abdominal MRI. CT the abdomen and pelvis 11/21/2018. FINDINGS: Comment: Today's study is limited by considerable patient motion. Lower chest: Small bilateral pleural effusions lying dependently. Signal void in the region of the aortic root related to prior TAVR procedure. Hepatobiliary: No discrete cystic or solid hepatic lesions are confidently identified on today's motion limited  examination. MRCP images are largely nondiagnostic. However, other T2 weighted sequences demonstrate no definite intrahepatic biliary ductal dilatation. A percutaneous cholecystostomy tube is noted. Multiple filling defects within the gallbladder are compatible with multiple gallstones. Gallbladder does not appear overly distended, and there is no large volume of pericholecystic fluid noted at this time. A trace amount of irregular T2 signal intensity is noted adjacent to the gallbladder, likely within normal limits given the indwelling percutaneous cholecystostomy tube. Innumerable filling defects are also noted within the common bile duct, compatible with choledocholithiasis. Common bile duct appears to measure up to 13 mm distally. Pancreas: Pancreatic atrophy. Multiple well-defined T1 hypointense, T2 hyperintense, nonenhancing lesions are noted throughout the body of the pancreas, measuring up to 1.4 cm (axial image 25 of series 4). No pancreatic ductal dilatation. No peripancreatic fluid collections or inflammatory changes. Spleen:  Unremarkable. Adrenals/Urinary Tract: There are multiple T1 hypointense, T2 hyperintense, nonenhancing lesions in the kidneys bilaterally, compatible with simple cysts, largest of which is exophytic in the posterior aspect of the upper pole of the right kidney measuring 5 cm. No hydroureteronephrosis in the visualized portions of the abdomen. Bilateral adrenal glands are normal in appearance. Stomach/Bowel: Visualized portions are unremarkable. Vascular/Lymphatic: Aortic atherosclerosis with fusiform aneurysmal dilatation of the infrarenal abdominal aorta which measures up to 4.2 x 5.2 cm. No lymphadenopathy identified in the abdomen. Other: No significant volume of ascites noted in the visualized portions of the peritoneal cavity. Musculoskeletal: No aggressive appearing lytic or blastic lesions noted in the visualized portions of the skeleton. IMPRESSION: 1. Choledocholithiasis  with dilated common bile duct measuring up to 13 mm in the porta hepatis. Despite the presence of numerous stones in this mildly dilated duct, there is no intrahepatic biliary ductal dilatation noted at this time. 2. Cholelithiasis with percutaneous cholecystostomy tube in place. Minimal surrounding T2 hyperintensity adjacent to the gallbladder which is likely within normal limits for a patient with an indwelling percutaneous cholecystostomy tube. 3. Aortic atherosclerosis with fusiform dilatation of the infrarenal abdominal aorta. 4. Small bilateral pleural effusions lying dependently. Electronically Signed   By: Vinnie Langton M.D.   On: 01/28/2019 08:04   Dg Ercp Biliary & Pancreatic Ducts  Result Date: 01/29/2019 CLINICAL DATA:  85 year old male with choledocholithiasis undergoing ERCP. EXAM: ERCP TECHNIQUE: Multiple spot images obtained with the fluoroscopic device and submitted for interpretation post-procedure. FLUOROSCOPY TIME:  Fluoroscopy Time:  3 minutes 14 seconds. COMPARISON:  MRCP 01/27/2019. FINDINGS: A total of 5 intraoperative spot views are submitted for review. The images demonstrate a flexible endoscope in the descending duodenum followed by wire cannulation of the common bile duct and cholangiogram. Multiple filling defects are present throughout the common bile duct consistent with choledocholithiasis. Subsequent images demonstrate sphincterotomy and balloon sweeping of the common duct. A pigtail drainage catheter is also evident in the right upper quadrant consistent with the recently placed percutaneous cholecystostomy tube. IMPRESSION: ERCP with sphincterotomy and balloon sweep of the common duct. These images were submitted for radiologic interpretation only. Please see the procedural report for the amount of contrast and the fluoroscopy time utilized. Electronically Signed  By: Jacqulynn Cadet M.D.   On: 01/29/2019 11:27   Mr Abdomen Mrcp Moise Boring Contast  Result Date:  01/28/2019 CLINICAL DATA:  85 year old male with history of acute cholecystitis in February 8299, complicated by sepsis and respiratory failure. Status post cholecystostomy drainage. Readmission for lethargy, fever, nausea and leukocytosis with newly increased liver function tests. EXAM: MRI ABDOMEN WITHOUT AND WITH CONTRAST (INCLUDING MRCP) TECHNIQUE: Multiplanar multisequence MR imaging of the abdomen was performed both before and after the administration of intravenous contrast. Heavily T2-weighted images of the biliary and pancreatic ducts were obtained, and three-dimensional MRCP images were rendered by post processing. CONTRAST:  9 mL of Gadavist. COMPARISON:  No prior abdominal MRI. CT the abdomen and pelvis 11/21/2018. FINDINGS: Comment: Today's study is limited by considerable patient motion. Lower chest: Small bilateral pleural effusions lying dependently. Signal void in the region of the aortic root related to prior TAVR procedure. Hepatobiliary: No discrete cystic or solid hepatic lesions are confidently identified on today's motion limited examination. MRCP images are largely nondiagnostic. However, other T2 weighted sequences demonstrate no definite intrahepatic biliary ductal dilatation. A percutaneous cholecystostomy tube is noted. Multiple filling defects within the gallbladder are compatible with multiple gallstones. Gallbladder does not appear overly distended, and there is no large volume of pericholecystic fluid noted at this time. A trace amount of irregular T2 signal intensity is noted adjacent to the gallbladder, likely within normal limits given the indwelling percutaneous cholecystostomy tube. Innumerable filling defects are also noted within the common bile duct, compatible with choledocholithiasis. Common bile duct appears to measure up to 13 mm distally. Pancreas: Pancreatic atrophy. Multiple well-defined T1 hypointense, T2 hyperintense, nonenhancing lesions are noted throughout the body  of the pancreas, measuring up to 1.4 cm (axial image 25 of series 4). No pancreatic ductal dilatation. No peripancreatic fluid collections or inflammatory changes. Spleen:  Unremarkable. Adrenals/Urinary Tract: There are multiple T1 hypointense, T2 hyperintense, nonenhancing lesions in the kidneys bilaterally, compatible with simple cysts, largest of which is exophytic in the posterior aspect of the upper pole of the right kidney measuring 5 cm. No hydroureteronephrosis in the visualized portions of the abdomen. Bilateral adrenal glands are normal in appearance. Stomach/Bowel: Visualized portions are unremarkable. Vascular/Lymphatic: Aortic atherosclerosis with fusiform aneurysmal dilatation of the infrarenal abdominal aorta which measures up to 4.2 x 5.2 cm. No lymphadenopathy identified in the abdomen. Other: No significant volume of ascites noted in the visualized portions of the peritoneal cavity. Musculoskeletal: No aggressive appearing lytic or blastic lesions noted in the visualized portions of the skeleton. IMPRESSION: 1. Choledocholithiasis with dilated common bile duct measuring up to 13 mm in the porta hepatis. Despite the presence of numerous stones in this mildly dilated duct, there is no intrahepatic biliary ductal dilatation noted at this time. 2. Cholelithiasis with percutaneous cholecystostomy tube in place. Minimal surrounding T2 hyperintensity adjacent to the gallbladder which is likely within normal limits for a patient with an indwelling percutaneous cholecystostomy tube. 3. Aortic atherosclerosis with fusiform dilatation of the infrarenal abdominal aorta. 4. Small bilateral pleural effusions lying dependently. Electronically Signed   By: Vinnie Langton M.D.   On: 01/28/2019 08:04    Anti-infectives: Anti-infectives (From admission, onward)   Start     Dose/Rate Route Frequency Ordered Stop   01/28/19 1400  ceFEPIme (MAXIPIME) 2 g in sodium chloride 0.9 % 100 mL IVPB     2 g 200 mL/hr  over 30 Minutes Intravenous Every 8 hours 01/28/19 0830     01/25/19 2200  vancomycin (VANCOCIN) 1,500 mg in sodium chloride 0.9 % 500 mL IVPB  Status:  Discontinued     1,500 mg 250 mL/hr over 120 Minutes Intravenous Every 24 hours 01/24/19 2027 01/26/19 1124   01/25/19 0200  ceFEPIme (MAXIPIME) 2 g in sodium chloride 0.9 % 100 mL IVPB  Status:  Discontinued     2 g 200 mL/hr over 30 Minutes Intravenous Every 12 hours 01/24/19 2211 01/28/19 0830   01/24/19 2200  ceFEPIme (MAXIPIME) 2 g in sodium chloride 0.9 % 100 mL IVPB  Status:  Discontinued     2 g 200 mL/hr over 30 Minutes Intravenous  Once 01/24/19 2158 01/24/19 2208   01/24/19 2200  metroNIDAZOLE (FLAGYL) IVPB 500 mg     500 mg 100 mL/hr over 60 Minutes Intravenous Every 8 hours 01/24/19 2158     01/24/19 1945  piperacillin-tazobactam (ZOSYN) IVPB 3.375 g     3.375 g 100 mL/hr over 30 Minutes Intravenous  Once 01/24/19 1931 01/24/19 2017   01/24/19 1930  ceFEPIme (MAXIPIME) 2 g in sodium chloride 0.9 % 100 mL IVPB  Status:  Discontinued     2 g 200 mL/hr over 30 Minutes Intravenous  Once 01/24/19 1918 01/24/19 2010   01/24/19 1930  metroNIDAZOLE (FLAGYL) IVPB 500 mg  Status:  Discontinued     500 mg 100 mL/hr over 60 Minutes Intravenous  Once 01/24/19 1918 01/24/19 1931   01/24/19 1930  vancomycin (VANCOCIN) IVPB 1000 mg/200 mL premix  Status:  Discontinued     1,000 mg 200 mL/hr over 60 Minutes Intravenous  Once 01/24/19 1918 01/24/19 1922   01/24/19 1930  vancomycin (VANCOCIN) 2,000 mg in sodium chloride 0.9 % 500 mL IVPB     2,000 mg 250 mL/hr over 120 Minutes Intravenous  Once 01/24/19 1922 01/25/19 0012       Assessment/Plan Dementia AAA Aortic stenosis status post TAVR Pulmonary nodules Chronic systolic CHF 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 Ardis Hughs, with findings of choledocholithiasis, treated with biliary sphincterotomy and balloon sweeping. Cystic duct did not fill with contrast - Cardiology places patient at moderate to high risk - AST normal. ALT and alk phos downtrending - No indication for emergent or urgent surgery at this time. Will continue to follow and reassess early next week.  - Dr. Redmond Pulling had a long discussion with patients daughter yesterday. Please see his note for further details. - Wife at bedside and all questions answered                       FEN - Diet NPO currently.  VTE - SCD, okay for chemical prophylaxis from surgical standpoint ID - Cefepime/Flagyl 4/12 >> WBC 6.7   LOS: 5 days    Jillyn Ledger , [facility] Surgery 01/29/2019, 2:24 PM Pager: [phone]

## 2019-01-29 NOTE — Anesthesia Preprocedure Evaluation (Addendum)
Anesthesia Evaluation  Patient identified by MRN, date of birth, ID band  Reviewed: Allergy & Precautions, NPO status , Patient's Chart, lab work & pertinent test results  History of Anesthesia Complications (+) POST - OP SPINAL HEADACHE  Airway Mallampati: II  TM Distance: >3 FB     Dental   Pulmonary asthma , sleep apnea ,    breath sounds clear to auscultation       Cardiovascular + CAD and +CHF   Rhythm:Regular Rate:Normal     Neuro/Psych    GI/Hepatic Neg liver ROS, History noted CG   Endo/Other    Renal/GU negative Renal ROS     Musculoskeletal   Abdominal   Peds  Hematology   Anesthesia Other Findings   Reproductive/Obstetrics                            Anesthesia Physical Anesthesia Plan  ASA: III  Anesthesia Plan: General   Post-op Pain Management:    Induction: Intravenous  PONV Risk Score and Plan: 2 and Ondansetron and Midazolam  Airway Management Planned:   Additional Equipment:   Intra-op Plan:   Post-operative Plan: Extubation in OR  Informed Consent: I have reviewed the patients History and Physical, chart, labs and discussed the procedure including the risks, benefits and alternatives for the proposed anesthesia with the patient or authorized representative who has indicated his/her understanding and acceptance.     Dental advisory given  Plan Discussed with: CRNA and Anesthesiologist  Anesthesia Plan Comments:         Anesthesia Quick Evaluation

## 2019-01-29 NOTE — Anesthesia Procedure Notes (Signed)
Arterial Line Insertion Start/End4/17/2020 8:50 AM, 01/29/2019 9:05 AM Performed by: Josephine Igo, CRNA, CRNA  Preanesthetic checklist: patient identified, IV checked, surgical consent and pre-op evaluation Lidocaine 1% used for infiltration Left, radial was placed Catheter size: 20 G Hand hygiene performed , maximum sterile barriers used  and Seldinger technique used  Attempts: 1 Procedure performed without using ultrasound guided technique. Following insertion, dressing applied. Post procedure assessment: normal  Patient tolerated the procedure well with no immediate complications.

## 2019-01-29 NOTE — Op Note (Signed)
[facility] Patient Name: William [name] Procedure Date : 01/29/2019 MRN: [id number] Attending MD: Milus Banister , MD Date of Birth: [birth year] CSN: [id number] Age: 85 Admit Type: Inpatient Procedure:                ERCP Indications:              Common bile duct stone(s) Providers:                Milus Banister, MD, Vista Lawman, RN, Grace Isaac, RN, Ladona Ridgel, Technician, Charolette Child, Technician Referring MD:              Medicines:                General Anesthesia, Indomethacin 100 mg PR, IV                            antibiotics on floor Complications:            No immediate complications. Estimated blood loss:                            None Estimated Blood Loss:     Estimated blood loss: none. Procedure:                Pre-Anesthesia Assessment:                           - Prior to the procedure, a History and Physical                            was performed, and patient medications and                            allergies were reviewed. The patient's tolerance of                            previous anesthesia was also reviewed. The risks                            and benefits of the procedure and the sedation                            options and risks were discussed with the patient.                            All questions were answered, and informed consent                            was obtained. Prior Anticoagulants: The patient has                            taken Plavix (clopidogrel),  last dose was 5 days                            prior to procedure. ASA Grade Assessment: IV - A                            patient with severe systemic disease that is a                            constant threat to life. After reviewing the risks                            and benefits, the patient was deemed in                            satisfactory condition to undergo the procedure.             After obtaining informed consent, the scope was                            passed under direct vision. Throughout the                            procedure, the patient's blood pressure, pulse, and                            oxygen saturations were monitored continuously. The                            TJF-Q180V ([id number]) Olympus Duodensocope was                            introduced through the mouth, and used to inject                            contrast into and used to inject contrast into the                            bile duct. The ERCP was accomplished without                            difficulty. The patient tolerated the procedure                            well. Scope In: Scope Out: Findings:      Scout film showed the GB drain in the RUQ. The duodenoscope was advanced       to the region of the major papilla without detailed examination of the       UGI tract. A 44 Autotome over a .035in wire was used to cannulate the       bile duct and contrast was injected. Cholangiogram revealed a diffusely       dilated extrahepatic biliary tree (CBD 8-71mm) containing multliple       mobile filling defects consistent with stones (each around  6-87mm). The       cystic duct did not fill with contrast. An adequate biliary       sphincterotomy was performed over the wire and then the bile duct was       swept several times with a 9-10mm biliary retreival balloon. Several       black, smooth stones and a small amount of purulence were delivered into       the duodenum. An occlusion cholangiogram showed no remaining stones in       the bile duct and so the scope was removed. The main pancreatic duct was       never cannulated with the wire or injected with dye. Impression:               - Choledocholithiasis, treated with biliary                            sphincterotomy and balloon sweeping.                           - Cystic duct did not fill with contrast,                             suggesting continued obstruction. Recommendation:           - Return patient to hospital ward for ongoing care. Procedure Code(s):        --- Professional ---                           [phone], Endoscopic retrograde                            cholangiopancreatography (ERCP); with removal of                            calculi/debris from biliary/pancreatic duct(s)                           43262, Endoscopic retrograde                            cholangiopancreatography (ERCP); with                            sphincterotomy/papillotomy Diagnosis Code(s):        --- Professional ---                           K80.50, Calculus of bile duct without cholangitis                            or cholecystitis without obstruction CPT copyright 2019 [employer]. All rights reserved. The codes documented in this report are preliminary and upon coder review may  be revised to meet current compliance requirements. Milus Banister, MD 01/29/2019 10:33:04 AM This report has been signed electronically. Number of Addenda: 0

## 2019-01-29 NOTE — Interval H&P Note (Signed)
History and Physical Interval Note:  01/29/2019 7:56 AM  William [name]  has presented today for surgery, with the diagnosis of CBD stones.  The various methods of treatment have been discussed with the patient and family. After consideration of risks, benefits and other options for treatment, the patient has consented to  Procedure(s): ENDOSCOPIC RETROGRADE CHOLANGIOPANCREATOGRAPHY (ERCP) (N/A) as a surgical intervention.  The patient's history has been reviewed, patient examined, no change in status, stable for surgery.  I have reviewed the patient's chart and labs.  Questions were answered to the patient's satisfaction.     Milus Banister

## 2019-01-29 NOTE — Transfer of Care (Signed)
Immediate Anesthesia Transfer of Care Note  Patient: William [name]  Procedure(s) Performed: ENDOSCOPIC RETROGRADE CHOLANGIOPANCREATOGRAPHY (ERCP) (N/A ) SPHINCTEROTOMY  Patient Location: PACU  Anesthesia Type:General  Level of Consciousness: drowsy and patient cooperative  Airway & Oxygen Therapy: Patient Spontanous Breathing and Patient connected to face mask oxygen  Post-op Assessment: Report given to RN and Post -op Vital signs reviewed and stable  Post vital signs: Reviewed and stable  Last Vitals:  Vitals Value Taken Time  BP    Temp    Pulse    Resp    SpO2      Last Pain:  Vitals:   01/29/19 0805  TempSrc: Oral  PainSc: 0-No pain         Complications: No apparent anesthesia complications

## 2019-01-29 NOTE — Anesthesia Procedure Notes (Signed)
Procedure Name: Intubation Date/Time: 01/29/2019 9:39 AM Performed by: Kathryne Hitch, CRNA Pre-anesthesia Checklist: Patient identified, Emergency Drugs available, Suction available, Patient being monitored and Timeout performed Patient Re-evaluated:Patient Re-evaluated prior to induction Oxygen Delivery Method: Circle system utilized Preoxygenation: Pre-oxygenation with 100% oxygen Induction Type: IV induction Laryngoscope Size: Mac and 4 Grade View: Grade II Tube size: 7.5 mm Number of attempts: 1 Airway Equipment and Method: Stylet Placement Confirmation: ETT inserted through vocal cords under direct vision,  positive ETCO2 and breath sounds checked- equal and bilateral Secured at: 23 cm Tube secured with: Tape Dental Injury: Teeth and Oropharynx as per pre-operative assessment

## 2019-01-30 DIAGNOSIS — Z01810 Encounter for preprocedural cardiovascular examination: Secondary | ICD-10-CM

## 2019-01-30 LAB — COMPREHENSIVE METABOLIC PANEL
ALT: 44 U/L (ref 0–44)
AST: 22 U/L (ref 15–41)
Albumin: 2.3 g/dL — ABNORMAL LOW (ref 3.5–5.0)
Alkaline Phosphatase: 258 U/L — ABNORMAL HIGH (ref 38–126)
Anion gap: 8 (ref 5–15)
BUN: 15 mg/dL (ref 8–23)
CO2: 22 mmol/L (ref 22–32)
Calcium: 8.4 mg/dL — ABNORMAL LOW (ref 8.9–10.3)
Chloride: 109 mmol/L (ref 98–111)
Creatinine, Ser: 0.92 mg/dL (ref 0.61–1.24)
GFR calc Af Amer: >60 mL/min (ref >60)
GFR calc non Af Amer: >60 mL/min (ref >60)
Glucose, Bld: 111 mg/dL — ABNORMAL HIGH (ref 70–99)
Potassium: 3.9 mmol/L (ref 3.5–5.1)
Sodium: 139 mmol/L (ref 135–145)
Total Bilirubin: 1.3 mg/dL — ABNORMAL HIGH (ref 0.3–1.2)
Total Protein: 6.3 g/dL — ABNORMAL LOW (ref 6.5–8.1)

## 2019-01-30 LAB — CBC
HCT: 33.5 % — ABNORMAL LOW (ref 39.0–52.0)
Hemoglobin: 11.1 g/dL — ABNORMAL LOW (ref 13.0–17.0)
MCH: 30.8 pg (ref 26.0–34.0)
MCHC: 33.1 g/dL (ref 30.0–36.0)
MCV: 93.1 fL (ref 80.0–100.0)
Platelets: 118 10*3/uL — ABNORMAL LOW (ref 150–400)
RBC: 3.6 MIL/uL — ABNORMAL LOW (ref 4.22–5.81)
RDW: 15.7 % — ABNORMAL HIGH (ref 11.5–15.5)
WBC: 6.9 10*3/uL (ref 4.0–10.5)
nRBC: 0 % (ref 0.0–0.2)

## 2019-01-30 LAB — GLUCOSE, CAPILLARY
Glucose-Capillary: 114 mg/dL — ABNORMAL HIGH (ref 70–99)
Glucose-Capillary: 155 mg/dL — ABNORMAL HIGH (ref 70–99)
Glucose-Capillary: 155 mg/dL — ABNORMAL HIGH (ref 70–99)
Glucose-Capillary: 168 mg/dL — ABNORMAL HIGH (ref 70–99)
Glucose-Capillary: 172 mg/dL — ABNORMAL HIGH (ref 70–99)

## 2019-01-30 LAB — MAGNESIUM: Magnesium: 2 mg/dL (ref 1.7–2.4)

## 2019-01-30 MED ORDER — KCL IN DEXTROSE-NACL 10-5-0.45 MEQ/L-%-% IV SOLN
INTRAVENOUS | Status: DC
  Administered 2019-01-30 – 2019-01-31 (×2): via INTRAVENOUS
  Filled 2019-01-30 (×2): qty 1000

## 2019-01-30 NOTE — Progress Notes (Signed)
PROGRESS NOTE    William [name]  MWN:[id number] DOB: May 06, 1928 DOA: 01/24/2019 PCP: Gayland Curry, DO   Brief Narrative:  85 y.o. WM PMHx Dementia, AAA, aortic stenosis status post TAVR, chronic systolic CHF pulmonary nodules, chronic systolic CHF, history of VT with AICD that was removed due to infection, cholecystitis and cholangitis with biliary drain in place, debility at SNF, now presenting to the emergency department for evaluation of lethargy, fevers, and vomiting.  Patient is unable to contribute much to the history due to his clinical condition.  Per report of the patient's daughter and nursing facility personnel, there has been decreasing output from the patient's biliary drain and he has developed nausea over the past couple days with very poor intake, progressing to lethargy today.  He was reportedly pale, slumped over, and vomiting some brownish material.  He was noted to have a fever to 100.7 F, tachycardic in the 130s, and blood pressure stable initially.  He has not been coughing and has not appeared dyspneic or requiring any oxygen per report.  He was sent into the ED for evaluation of his change in condition.   ED Course: Upon arrival to the ED, patient is found to be febrile to 38.2 C, saturating well on room air, tachycardic in the 120s, initial blood pressure 83 systolic, and normal respiratory rate.  EKG features a sinus tachycardia with chronic LBBB.  Chest x-ray is notable for patchy left basilar atelectasis versus early infiltrate.  Chemistry panel notable for sodium 133, glucose 255, alkaline phosphatase 595, AST and ALT in the 300 range, and total bilirubin of 4.  CBC features a leukocytosis to 11,100 and a slight thrombocytopenia.  Lactic acid is elevated to 2.7.  Blood cultures were collected in the ED, 250 cc normal saline bolus was given, and the patient was treated with vancomycin and Zosyn.  General surgery was consulted by the ED clinician, recommended consultation  with GI, ED PA reports that she is awaiting for callback from GI at this time and requests a medical admission.   Subjective: Shin in bed, somewhat confused, in no distress denies any headache chest or abdominal pain but is a poor historian.   Assessment & Plan:    Severe sepsis with Enterobacter bacteremia.  Due to choledocholithiasis and ascending cholangitis, status post biliary drain placement on 01/25/2019, GI general surgery and ID following.  Currently on cefepime and Flagyl IV.  Bowel rest.  Will place on gentle hydration.  Status post ERCP results below with very sphincterectomy and balloon sweep, however there is suspicion of continued obstruction per Dr. Ardis Hughs.  Will defer further management to general surgery and GI.  Moderate risk candidate for any open surgical procedure per cardiology.  Sepsis pathophysiology has resolved. DW with GI Dr Benson Norway on 4/18.   Cholelithiasis/occluded cholecystostomy tube - 4/13 cholecystostomy tube exchanged - Numerous stones removed along with purulent drainage which was sent for culture. - Cystic duct occluded. He underwent ERCP this morning with sphincterotomy. Previous MD discussed in detail with Dr. Redmond Pulling with general surgery on 4/16.  Cardiology was consulted for risk stratification.  Patient considered moderate to severe risk of cardiac complications if he undergoes cholecystectomy.  All of this was discussed in detail with patient's family members including his wife as well as daughter by Dr Maryland Pink.  Plan is to revisit this on Monday.  Continue current treatment for now.  Acute encephalopathy Patient pleasantly confused.  No focal neurological deficits.  He does tend to get  agitated at times.   Chronic systolic CHF/aortic stenosis status post TAVR -EF 25 to 30% with diffuse hypokinesis, mild to moderate MR, TAVR -Echocardiogram February 2020: EF 25-30% with diffuse hypokinesis, mild-moderate MR, TAVR, negative endocarditis.Marland Kitchen  Plavix is on  hold. Currently stable from a cardiac standpoint.  CAD Minimal CAD was noted on cardiac catheterization done in September 2019. Chronic LBBB noted on EKG.  Plavix is on hold.  OSA - CPAP per respiratory  History of ventricular tachycardia - Patient had AICD but was removed December 2019 due to infection.  Occasional episodes of nonsustained VT noted.  Potassium 3.8 today.  Magnesium 2.0.  We will give additional dose of potassium later today.   Hyperglycemia - Serum glucose in the ED 255.  CBGs have been reasonably well controlled subsequently. - Hemoglobin A1c 5.7 -LDL 100 - sensitive SSI  Dementia with Toxic Encephalopathy -  - Pleasantly confused.  Occasional agitation noted.  Improved when his wife is at bedside.    Goals of care Seen by palliative medicine.  Full scope of treatment to continue.   DVT prophylaxis: SCD Code Status: Full Family Communication: Discussed with patient's daughter yesterday and with his wife today.   Disposition Plan: Unclear for now.  Possibly may need to go to skilled nursing facility when medically ready for discharge.  May end up undergoing cholecystectomy early next week.  Will stay here in the hospital through the weekend.   Consultants:  GI CCS ID  Procedures/Significant Events:  4/12 PCXR: LEFT basilar atelectasis/early infiltrate 4/12 US abdomen RUQ:-Mild gallbladder wall thickening, probably chronic sequelae of cholecystitis and cholecystostomy tube.  -Cholelithiasis again noted -No intra-/extrahepatic biliary ductal dilation noted  4/13 cholecystostomy tube exchange with fluoroscopy:-Innumerable gallstones. Unable to opacify the common bile duct due to obstruction of the cystic duct. -Purulent looking fluid removed from the gallbladder. Fluid sent for culture. 4/16: Drain check and placement of new suture 4/17 ERCP: - Choledocholithiasis, treated with biliary sphincterotomy and balloon sweeping. Cystic duct did not fill with  contrast, suggesting continued obstruction.   LINES / TUBES:  Cholecystostomy tube/13>>  Cultures 4/12 blood RIGHT antecubital NGTD 4/12 blood counts right hand NGTD  4/13 gallbladder positive Enterobacter    Antimicrobials: Anti-infectives (From admission, onward)   Start     Stop   01/25/19 2200  vancomycin (VANCOCIN) 1,500 mg in sodium chloride 0.9 % 500 mL IVPB  Status:  Discontinued     01/26/19 1124   01/25/19 0200  ceFEPIme (MAXIPIME) 2 g in sodium chloride 0.9 % 100 mL IVPB         01/24/19 2200  ceFEPIme (MAXIPIME) 2 g in sodium chloride 0.9 % 100 mL IVPB  Status:  Discontinued     01/24/19 2208   01/24/19 2200  metroNIDAZOLE (FLAGYL) IVPB 500 mg         01/24/19 1945  piperacillin-tazobactam (ZOSYN) IVPB 3.375 g     01/24/19 2017   01/24/19 1930  ceFEPIme (MAXIPIME) 2 g in sodium chloride 0.9 % 100 mL IVPB  Status:  Discontinued     01/24/19 2010   01/24/19 1930  metroNIDAZOLE (FLAGYL) IVPB 500 mg  Status:  Discontinued     01/24/19 1931   01/24/19 1930  vancomycin (VANCOCIN) IVPB 1000 mg/200 mL premix  Status:  Discontinued     01/24/19 1922   01/24/19 1930  vancomycin (VANCOCIN) 2,000 mg in sodium chloride 0.9 % 500 mL IVPB     01/25/19 0012  Continuous Infusions: . sodium chloride 10 mL/hr at 01/25/19 0007  . ceFEPime (MAXIPIME) IV 2 g (01/30/19 0514)  . metronidazole 500 mg (01/30/19 0845)     Objective: Vitals:   01/30/19 0428 01/30/19 0607 01/30/19 0621 01/30/19 0747  BP: 95/69   108/69  Pulse: 95   97  Resp: 14   20  Temp:   98.4 F (36.9 C) 98.5 F (36.9 C)  TempSrc:   Oral Oral  SpO2: 95%   96%  Weight:  91.6 kg    Height:        Intake/Output Summary (Last 24 hours) at 01/30/2019 0956 Last data filed at 01/30/2019 0920 Gross per 24 hour  Intake 1786.82 ml  Output 805 ml  Net 981.82 ml   Filed Weights   01/29/19 0657 01/29/19 0805 01/30/19 0607  Weight: 88.4 kg 88.4 kg 91.6 kg   Exam  Confused, No new F.N deficits, Normal  affect [place].AT,PERRAL Supple Neck,No JVD, No cervical lymphadenopathy appriciated.  Symmetrical Chest wall movement, Good air movement bilaterally, CTAB RRR,No Gallops, Rubs or new Murmurs, No Parasternal Heave +ve B.Sounds, Abd Soft, R. GB drain in place, No tenderness, No organomegaly appriciated, No rebound - guarding or rigidity. No Cyanosis, Clubbing or edema, No new Rash or bruise   CBC: Recent Labs  Lab 01/24/19 1935 01/25/19 0305 01/26/19 0257 01/27/19 0425 01/28/19 0347 01/29/19 0340 01/30/19 0346  WBC 11.1* 10.6* 7.7 5.6 5.7 6.7 6.9  NEUTROABS 10.0* 9.0*  --   --   --   --   --   HGB 12.8* 12.0* 11.8* 11.1* 11.6* 11.9* 11.1*  HCT 36.6* 35.9* 35.9* 34.2* 35.4* 35.3* 33.5*  MCV 89.9 89.8 91.8 92.9 91.5 91.9 93.1  PLT 146* 132* 123* 124* 131* 127* 409*   Basic Metabolic Panel: Recent Labs  Lab 01/26/19 0257 01/27/19 0425 01/28/19 0347 01/29/19 0340 01/30/19 0346  NA 140 139 134* 136 139  K 4.5 3.0* 3.3* 3.8 3.9  CL 107 105 105 105 109  CO2 24 22 20* 26 22  GLUCOSE 115* 115* 118* 131* 111*  BUN 14 18 13 12 15   CREATININE 1.15 1.00 0.89 0.95 0.92  CALCIUM 8.6* 8.4* 8.1* 8.4* 8.4*  MG 2.2 2.1 2.0 2.0 2.0   GFR: Estimated Creatinine Clearance: 61.9 mL/min (by C-G formula based on SCr of 0.92 mg/dL). Liver Function Tests: Recent Labs  Lab 01/25/19 0305 01/27/19 0425 01/28/19 0347 01/29/19 0340 01/30/19 0346  AST 252* 52* 33 27 22  ALT 263* 119* 83* 62* 44  ALKPHOS 549* 373* 365* 308* 258*  BILITOT 4.9* 1.7* 1.4* 1.5* 1.3*  PROT 6.4* 6.1* 6.1* 6.2* 6.3*  ALBUMIN 2.5* 2.3* 2.3* 2.3* 2.3*   Recent Labs  Lab 01/24/19 1935  LIPASE 51   Recent Labs  Lab 01/24/19 2317  AMMONIA 26   Coagulation Profile: Recent Labs  Lab 01/24/19 2218  INR 1.2   Cardiac Enzymes: Recent Labs  Lab 01/24/19 2154  TROPONINI 0.04*   HbA1C: No results for input(s): HGBA1C in the last 72 hours. CBG: Recent Labs  Lab 01/29/19 0744 01/29/19 1713 01/29/19 2034  01/30/19 0020 01/30/19 0744  GLUCAP 126* 130* 162* 168* 114*   Lipid Profile: No results for input(s): CHOL, HDL, LDLCALC, TRIG, CHOLHDL, LDLDIRECT in the last 72 hours. Urine analysis:    Component Value Date/Time   COLORURINE AMBER (A) 01/24/2019 2138   APPEARANCEUR HAZY (A) 01/24/2019 2138   LABSPEC 1.018 01/24/2019 2138   PHURINE 5.0 01/24/2019 2138  GLUCOSEU 50 (A) 01/24/2019 2138   HGBUR SMALL (A) 01/24/2019 2138   BILIRUBINUR SMALL (A) 01/24/2019 2138   KETONESUR NEGATIVE 01/24/2019 2138   PROTEINUR NEGATIVE 01/24/2019 2138   NITRITE NEGATIVE 01/24/2019 2138   LEUKOCYTESUR LARGE (A) 01/24/2019 2138    Recent Results (from the past 240 hour(s))  Blood Culture (routine x 2)     Status: None   Collection Time: 01/24/19  7:15 PM  Result Value Ref Range Status   Specimen Description BLOOD RIGHT ANTECUBITAL  Final   Special Requests   Final    BOTTLES DRAWN AEROBIC AND ANAEROBIC Blood Culture results may not be optimal due to an inadequate volume of blood received in culture bottles   Culture   Final    NO GROWTH 5 DAYS Performed at [facility], [place] [address] [place], [place] [address]    Report Status 01/29/2019 FINAL  Final  Blood Culture (routine x 2)     Status: None   Collection Time: 01/24/19  7:36 PM  Result Value Ref Range Status   Specimen Description BLOOD RIGHT HAND  Final   Special Requests   Final    BOTTLES DRAWN AEROBIC AND ANAEROBIC Blood Culture results may not be optimal due to an inadequate volume of blood received in culture bottles   Culture   Final    NO GROWTH 5 DAYS Performed at [facility], [place] [address], [place], [place] [address]    Report Status 01/29/2019 FINAL  Final  Aerobic/Anaerobic Culture (surgical/deep wound)     Status: None (Preliminary result)   Collection Time: 01/25/19  2:57 PM  Result Value Ref Range Status   Specimen Description GALL BLADDER  Final   Special Requests NONE  Final   Gram Stain   Final     ABUNDANT WBC PRESENT, PREDOMINANTLY PMN MODERATE GRAM NEGATIVE RODS RARE GRAM POSITIVE COCCI    Culture   Final    ABUNDANT ENTEROBACTER HORMAECHEI NO ANAEROBES ISOLATED; CULTURE IN PROGRESS FOR 5 DAYS    Report Status PENDING  Incomplete   Organism ID, Bacteria ENTEROBACTER HORMAECHEI  Final      Susceptibility   Enterobacter hormaechei - MIC*    CEFAZOLIN >=64 RESISTANT Resistant     CEFEPIME <=1 SENSITIVE Sensitive     CEFTAZIDIME >=64 RESISTANT Resistant     CEFTRIAXONE >=64 RESISTANT Resistant     CIPROFLOXACIN 0.5 SENSITIVE Sensitive     GENTAMICIN <=1 SENSITIVE Sensitive     IMIPENEM 0.5 SENSITIVE Sensitive     TRIMETH/SULFA <=20 SENSITIVE Sensitive     PIP/TAZO Value in next row Resistant      >=128 RESISTANTPerformed at [facility], [address], [place], [place] [address]    * ABUNDANT ENTEROBACTER HORMAECHEI         Radiology Studies: Ir Sinus/fist Tube Chk-non Gi  Result Date: 01/28/2019 INDICATION: 85 year old with cholecystostomy tube that was recently replaced. The retention suture has been pulled out of the skin. EXAM: DRAIN INJECTION WITH FLUOROSCOPY COMPARISON:  None. MEDICATIONS: None ANESTHESIA/SEDATION: None COMPLICATIONS: None immediate. TECHNIQUE: Drain was injected with contrast under fluoroscopy. 10 mL Omnipaque 300 was injected. PROCEDURE: The drain and surrounding skin was prepped with chlorhexidine. A new 0-Prolene suture was used to secure the catheter to the skin. FINDINGS: Cholecystostomy tube remains within the gallbladder. Again noted are multiple filling defects compatible with gallstones. IMPRESSION: Cholecystostomy tube remains in the gallbladder. Drain was secured to skin with new suture. Electronically Signed  By: Markus Daft M.D.   On: 01/28/2019 17:47   Dg Ercp Biliary & Pancreatic Ducts  Result Date: 01/29/2019 CLINICAL DATA:  85 year old male with choledocholithiasis undergoing ERCP. EXAM: ERCP TECHNIQUE: Multiple spot images  obtained with the fluoroscopic device and submitted for interpretation post-procedure. FLUOROSCOPY TIME:  Fluoroscopy Time:  3 minutes 14 seconds. COMPARISON:  MRCP 01/27/2019. FINDINGS: A total of 5 intraoperative spot views are submitted for review. The images demonstrate a flexible endoscope in the descending duodenum followed by wire cannulation of the common bile duct and cholangiogram. Multiple filling defects are present throughout the common bile duct consistent with choledocholithiasis. Subsequent images demonstrate sphincterotomy and balloon sweeping of the common duct. A pigtail drainage catheter is also evident in the right upper quadrant consistent with the recently placed percutaneous cholecystostomy tube. IMPRESSION: ERCP with sphincterotomy and balloon sweep of the common duct. These images were submitted for radiologic interpretation only. Please see the procedural report for the amount of contrast and the fluoroscopy time utilized. Electronically Signed   By: Jacqulynn Cadet M.D.   On: 01/29/2019 11:27        Scheduled Meds: . amiodarone  100 mg Oral Daily  . insulin aspart  0-9 Units Subcutaneous Q4H  . pantoprazole (PROTONIX) IV  40 mg Intravenous Q24H  . sodium chloride flush  3 mL Intravenous Q12H  . sodium chloride flush  3 mL Intravenous Q12H  . sodium chloride flush  5 mL Intracatheter Q8H   Continuous Infusions: . sodium chloride 10 mL/hr at 01/25/19 0007  . ceFEPime (MAXIPIME) IV 2 g (01/30/19 0514)  . metronidazole 500 mg (01/30/19 0845)     LOS: 6 days   Signature  Lala Lund M.D on 01/30/2019 at 9:56 AM   -  To page go to [email or web address]

## 2019-01-30 NOTE — Progress Notes (Signed)
   Progress Note  Patient Name: William [name] Date of Encounter: 01/30/2019  Primary Cardiologist: Dr. Dani Gobble Croitoru  Chart reviewed including recent cardiology consultation recommendations.  Patient in room with sitter at this time.  Contact precautions in place.  He is awake, not a good historian.  Afebrile.  Systolic blood pressure ranging 95-110, heart rate 90s to 100 in sinus rhythm by telemetry which I personally reviewed.  Occasional PVCs noted, no VT.  Patient fairly debilitated at baseline, was in SNF prior to admission.  Nonlabored breathing at rest.  Lab work shows potassium 3.9, BUN 15, creatinine 0.92, AST 22, ALT 44, hemoglobin 11.1, platelets 118.  Shows old left bundle branch block.  TEE 11/26/2018:  1. The left ventricle has has severely reduced systolic function, with an ejection fraction of 25-30%. The cavity size was normal. Left ventricular diffuse hypokinesis.  2. The right ventricle has normal systolic function. The cavity was normal. There is no increase in right ventricular wall thickness.  3. The mitral valve is degenerative. There is mild thickening. Mitral valve regurgitation is mild to moderate by color flow Doppler. No mitral valve vegetation visualized.  4. The tricuspid valve was normal in structure.  5. A 63mm an Wende Crease Sapien bioprosthetic aortic valve (TAVR) valve is present in the aortic position. Procedure Date: 08/18/2018 Normal aortic valve prosthesis.  6. The pulmonic valve was normal in structure.  7. No endocarditis.  Overall medically complex patient as detailed in recent cardiology consultation note with overall high risk from a surgical perspective (cholecystectomy).  He continues on low-dose amiodarone, no VT by telemetry.  Also remains on broad-spectrum antibiotics for treatment of sepsis he is off all antiplatelet medications as well as Toprol-XL.  If systolic blood pressure trending more consistently over 100, will consider  reinitiating low-dose beta-blocker.    Signed, Rozann Lesches, MD  01/30/2019, 10:55 AM

## 2019-01-30 NOTE — Progress Notes (Signed)
Progress Note for [facility]  Subjective: No acute events.  His wife reports that he has intermittent confusion, which is not new for him.  Objective: Vital signs in last 24 hours: Temp:  [97.6 F (36.4 C)-98.8 F (37.1 C)] 98.5 F (36.9 C) (04/18 0747) Pulse Rate:  [90-103] 97 (04/18 0747) Resp:  [13-20] 20 (04/18 0747) BP: (88-108)/(58-71) 108/69 (04/18 0747) SpO2:  [95 %-97 %] 96 % (04/18 0747) Weight:  [91.6 kg] 91.6 kg (04/18 0607) Last BM Date: 01/30/19  Intake/Output from previous day: 04/17 0701 - 04/18 0700 In: 1666.8 [I.V.:992.7; IV Piggyback:624.2] Out: 805 [Urine:800; Blood:5] Intake/Output this shift: Total I/O In: 120 [P.O.:120] Out: -   General appearance: awake GI: soft, non-tender; bowel sounds normal; no masses,  no organomegaly  Lab Results: Recent Labs    01/28/19 0347 01/29/19 0340 01/30/19 0346  WBC 5.7 6.7 6.9  HGB 11.6* 11.9* 11.1*  HCT 35.4* 35.3* 33.5*  PLT 131* 127* 118*   BMET Recent Labs    01/28/19 0347 01/29/19 0340 01/30/19 0346  NA 134* 136 139  K 3.3* 3.8 3.9  CL 105 105 109  CO2 20* 26 22  GLUCOSE 118* 131* 111*  BUN 13 12 15   CREATININE 0.89 0.95 0.92  CALCIUM 8.1* 8.4* 8.4*   LFT Recent Labs    01/30/19 0346  PROT 6.3*  ALBUMIN 2.3*  AST 22  ALT 44  ALKPHOS 258*  BILITOT 1.3*   PT/INR No results for input(s): LABPROT, INR in the last 72 hours. Hepatitis Panel No results for input(s): HEPBSAG, HCVAB, HEPAIGM, HEPBIGM in the last 72 hours. C-Diff No results for input(s): CDIFFTOX in the last 72 hours. Fecal Lactopherrin No results for input(s): FECLLACTOFRN in the last 72 hours.  Studies/Results: Ir Sinus/fist Tube Chk-non Gi  Result Date: 01/28/2019 INDICATION: 85 year old with cholecystostomy tube that was recently replaced. The retention suture has been pulled out of the skin. EXAM: DRAIN INJECTION WITH FLUOROSCOPY COMPARISON:  None. MEDICATIONS: None ANESTHESIA/SEDATION: None COMPLICATIONS: None  immediate. TECHNIQUE: Drain was injected with contrast under fluoroscopy. 10 mL Omnipaque 300 was injected. PROCEDURE: The drain and surrounding skin was prepped with chlorhexidine. A new 0-Prolene suture was used to secure the catheter to the skin. FINDINGS: Cholecystostomy tube remains within the gallbladder. Again noted are multiple filling defects compatible with gallstones. IMPRESSION: Cholecystostomy tube remains in the gallbladder. Drain was secured to skin with new suture. Electronically Signed   By: Markus Daft M.D.   On: 01/28/2019 17:47   Dg Ercp Biliary & Pancreatic Ducts  Result Date: 01/29/2019 CLINICAL DATA:  85 year old male with choledocholithiasis undergoing ERCP. EXAM: ERCP TECHNIQUE: Multiple spot images obtained with the fluoroscopic device and submitted for interpretation post-procedure. FLUOROSCOPY TIME:  Fluoroscopy Time:  3 minutes 14 seconds. COMPARISON:  MRCP 01/27/2019. FINDINGS: A total of 5 intraoperative spot views are submitted for review. The images demonstrate a flexible endoscope in the descending duodenum followed by wire cannulation of the common bile duct and cholangiogram. Multiple filling defects are present throughout the common bile duct consistent with choledocholithiasis. Subsequent images demonstrate sphincterotomy and balloon sweeping of the common duct. A pigtail drainage catheter is also evident in the right upper quadrant consistent with the recently placed percutaneous cholecystostomy tube. IMPRESSION: ERCP with sphincterotomy and balloon sweep of the common duct. These images were submitted for radiologic interpretation only. Please see the procedural report for the amount of contrast and the fluoroscopy time utilized. Electronically Signed   By: Dellis Filbert.D.  On: 01/29/2019 11:27    Medications:  Scheduled: . amiodarone  100 mg Oral Daily  . insulin aspart  0-9 Units Subcutaneous Q4H  . pantoprazole (PROTONIX) IV  40 mg Intravenous Q24H  .  sodium chloride flush  3 mL Intravenous Q12H  . sodium chloride flush  3 mL Intravenous Q12H  . sodium chloride flush  5 mL Intracatheter Q8H   Continuous: . sodium chloride 10 mL/hr at 01/25/19 0007  . ceFEPime (MAXIPIME) IV 2 g (01/30/19 0514)  . dextrose 5 % and 0.45 % NaCl with KCl 10 mEq/L 75 mL/hr at 01/30/19 1220  . metronidazole 500 mg (01/30/19 0845)    Assessment/Plan: 1) Choledocholithiasis s/p ERCP. 2) Cholelithiasis.   The patient does have any post-ERCP complications.  He is well at this time from the GI standpoint.  Plan: 1) Lap chole per surgery. 2) Signing off.  LOS: 6 days   Gwendlyon Zumbro D 01/30/2019, 12:23 PM

## 2019-01-31 LAB — CBC
HCT: 33.4 % — ABNORMAL LOW (ref 39.0–52.0)
Hemoglobin: 11 g/dL — ABNORMAL LOW (ref 13.0–17.0)
MCH: 30.9 pg (ref 26.0–34.0)
MCHC: 32.9 g/dL (ref 30.0–36.0)
MCV: 93.8 fL (ref 80.0–100.0)
Platelets: 122 10*3/uL — ABNORMAL LOW (ref 150–400)
RBC: 3.56 MIL/uL — ABNORMAL LOW (ref 4.22–5.81)
RDW: 15.8 % — ABNORMAL HIGH (ref 11.5–15.5)
WBC: 6.9 10*3/uL (ref 4.0–10.5)
nRBC: 0 % (ref 0.0–0.2)

## 2019-01-31 LAB — COMPREHENSIVE METABOLIC PANEL
ALT: 34 U/L (ref 0–44)
AST: 19 U/L (ref 15–41)
Albumin: 2.3 g/dL — ABNORMAL LOW (ref 3.5–5.0)
Alkaline Phosphatase: 222 U/L — ABNORMAL HIGH (ref 38–126)
Anion gap: 11 (ref 5–15)
BUN: 15 mg/dL (ref 8–23)
CO2: 19 mmol/L — ABNORMAL LOW (ref 22–32)
Calcium: 8.1 mg/dL — ABNORMAL LOW (ref 8.9–10.3)
Chloride: 108 mmol/L (ref 98–111)
Creatinine, Ser: 0.98 mg/dL (ref 0.61–1.24)
GFR calc Af Amer: >60 mL/min (ref >60)
GFR calc non Af Amer: >60 mL/min (ref >60)
Glucose, Bld: 122 mg/dL — ABNORMAL HIGH (ref 70–99)
Potassium: 3.4 mmol/L — ABNORMAL LOW (ref 3.5–5.1)
Sodium: 138 mmol/L (ref 135–145)
Total Bilirubin: 1.2 mg/dL (ref 0.3–1.2)
Total Protein: 6 g/dL — ABNORMAL LOW (ref 6.5–8.1)

## 2019-01-31 LAB — GLUCOSE, CAPILLARY
Glucose-Capillary: 102 mg/dL — ABNORMAL HIGH (ref 70–99)
Glucose-Capillary: 131 mg/dL — ABNORMAL HIGH (ref 70–99)
Glucose-Capillary: 138 mg/dL — ABNORMAL HIGH (ref 70–99)
Glucose-Capillary: 139 mg/dL — ABNORMAL HIGH (ref 70–99)
Glucose-Capillary: 173 mg/dL — ABNORMAL HIGH (ref 70–99)
Glucose-Capillary: 173 mg/dL — ABNORMAL HIGH (ref 70–99)

## 2019-01-31 LAB — MAGNESIUM: Magnesium: 2 mg/dL (ref 1.7–2.4)

## 2019-01-31 MED ORDER — POTASSIUM CHLORIDE 20 MEQ/15ML (10%) PO SOLN
40.0000 meq | Freq: Once | ORAL | Status: AC
  Administered 2019-01-31: 40 meq via ORAL
  Filled 2019-01-31: qty 30

## 2019-01-31 MED ORDER — CARVEDILOL 3.125 MG PO TABS
3.1250 mg | ORAL_TABLET | Freq: Two times a day (BID) | ORAL | Status: DC
  Administered 2019-02-01 – 2019-02-07 (×4): 3.125 mg via ORAL
  Filled 2019-01-31 (×13): qty 1

## 2019-01-31 MED ORDER — POTASSIUM CHLORIDE 2 MEQ/ML IV SOLN
INTRAVENOUS | Status: DC
  Administered 2019-01-31 – 2019-02-01 (×2): via INTRAVENOUS
  Filled 2019-01-31 (×4): qty 1000

## 2019-01-31 NOTE — Progress Notes (Signed)
RT Note:  Patient has previously been refusing CPAP,  Patient has also been extremely confused.  He now has a Actuary at bedside.  Spoke with Writer said patient sats have been good so far during his shift.  I informed sitter that if patient started having difficulty, please have me called to come and assess needs.  Will continue to monitor.

## 2019-01-31 NOTE — Progress Notes (Signed)
Pharmacy Antibiotic Note  William [name] is a 85 y.o. male admitted on 01/24/2019 with sepsis due to cholangitis with enterobacter  Pharmacy has been consulted for cefepime dosing.  Plan: Continue Cefepime 2gm IV q8h and flagyl 500mg  IV q8h per ID  Height: 5\' 10"  (177.8 cm) Weight: 202 lb 13.2 oz (92 kg) IBW/kg (Calculated) : 73  Temp (24hrs), Avg:98.1 F (36.7 C), Min:97.7 F (36.5 C), Max:98.3 F (36.8 C)  Recent Labs  Lab 01/24/19 1935 01/24/19 2052 01/24/19 2317 01/25/19 0305 01/25/19 0536  01/27/19 0425 01/28/19 0347 01/29/19 0340 01/30/19 0346 01/31/19 0240  WBC 11.1*  --   --  10.6*  --    < > 5.6 5.7 6.7 6.9 6.9  CREATININE 1.11  --   --  1.19  --    < > 1.00 0.89 0.95 0.92 0.98  LATICACIDVEN 2.7* 2.6* 3.1* 2.3* 1.8  --   --   --   --   --   --    < > = values in this interval not displayed.    Estimated Creatinine Clearance: 58.3 mL/min (by C-G formula based on SCr of 0.98 mg/dL).    Allergies  Allergen Reactions  . Crestor [Rosuvastatin] Other (See Comments)    Hurts muscles  . Lipitor [Atorvastatin] Other (See Comments)    Hurts stomach  . Shrimp [Shellfish Allergy] Other (See Comments)    On MAR    Antimicrobials this admission: Vanc 4/12>>4/14 Cefepime 4/12>> Flagyl 4/12>> Zosyn x 1 4/12  Microbiology results: 4/13: Gallbladder cxs: enterobacter hormaechei (R-Ancef/Fortaz/CTX/Zosyn; S-Cefepime/Cipro/Imipenem) 4/12: Blood cxs: negative  Irean Kendricks A. Levada Dy, PharmD, [place] Please utilize Amion for appropriate phone number to reach the unit pharmacist ([place])   01/31/2019 8:56 AM

## 2019-01-31 NOTE — Progress Notes (Addendum)
PROGRESS NOTE    William [name]  YPP:[id number] DOB: 19-Jun-1928 DOA: 01/24/2019 PCP: Gayland Curry, DO   Brief Narrative:  85 y.o. WM PMHx Dementia, AAA, aortic stenosis status post TAVR, chronic systolic CHF pulmonary nodules, chronic systolic CHF, history of VT with AICD that was removed due to infection, cholecystitis and cholangitis with biliary drain in place, debility at SNF, now presenting to the emergency department for evaluation of lethargy, fevers, and vomiting.  Patient is unable to contribute much to the history due to his clinical condition.  Per report of the patient's daughter and nursing facility personnel, there has been decreasing output from the patient's biliary drain and he has developed nausea over the past couple days with very poor intake, progressing to lethargy today.  He was reportedly pale, slumped over, and vomiting some brownish material.  He was noted to have a fever to 100.7 F, tachycardic in the 130s, and blood pressure stable initially.  He has not been coughing and has not appeared dyspneic or requiring any oxygen per report.  He was sent into the ED for evaluation of his change in condition.   ED Course: Upon arrival to the ED, patient is found to be febrile to 38.2 C, saturating well on room air, tachycardic in the 120s, initial blood pressure 83 systolic, and normal respiratory rate.  EKG features a sinus tachycardia with chronic LBBB.  Chest x-ray is notable for patchy left basilar atelectasis versus early infiltrate.  Chemistry panel notable for sodium 133, glucose 255, alkaline phosphatase 595, AST and ALT in the 300 range, and total bilirubin of 4.  CBC features a leukocytosis to 11,100 and a slight thrombocytopenia.  Lactic acid is elevated to 2.7.  Blood cultures were collected in the ED, 250 cc normal saline bolus was given, and the patient was treated with vancomycin and Zosyn.  General surgery was consulted by the ED clinician, recommended consultation  with GI, ED PA reports that she is awaiting for callback from GI at this time and requests a medical admission.   Subjective: patient in bed, somewhat confused, in no distress denies any headache chest or abdominal pain but is a poor historian.   Assessment & Plan:    Severe sepsis with Enterobacter bacteremia due to gallstone cholecystitis along with occluded cholecystostomy tube.  Due to choledocholithiasis and ascending cholangitis, status post biliary drain exchange  on 01/25/2019, GI general surgery and ID following.  Currently on cefepime and Flagyl IV.  Bowel rest.  Will place on gentle hydration.  Status post ERCP results below with very sphincterectomy and balloon sweep, however there is suspicion of continued obstruction of the cystic duct per Dr. Ardis Hughs.  Will defer further management to general surgery and GI.  Moderate risk candidate for any open surgical procedure per cardiology.  Sepsis pathophysiology has resolved. DW with GI Dr Benson Norway on 4/18.   Cholelithiasis/occluded cholecystostomy tube - See plan above.  Cardiology was consulted for risk stratification.  Patient considered moderate to severe risk of cardiac complications if he undergoes cholecystectomy.  All of this was discussed in detail with patient's family members including his wife as well as daughter by Dr Maryland Pink.  Plan is to revisit this on Monday.  Continue current treatment for now.  Acute toxic encephalopathy - Patient pleasantly confused.  No focal neurological deficits.  He does tend to get agitated at times.  Underlying dementia as well.  Remains at risk for delirium.   Chronic systolic CHF/aortic stenosis status post  TAVR - EF 25 to 30% with diffuse hypokinesis, mild to moderate MR, TAVR, Echocardiogram February 2020: EF 25-30% with diffuse hypokinesis, mild-moderate MR, TAVR, negative endocarditis.Marland Kitchen  Plavix is on hold.  Evaluated by cardiology moderate to high risk candidate for adverse cardiopulmonary outcome  during any operative procedure.  Family understands this and accepts the risk.  CAD -  Minimal CAD was noted on cardiac catheterization done in September 2019. Chronic LBBB noted on EKG.  Plavix is on hold.  OSA - CPAP per respiratory  History of ventricular tachycardia  - Patient had AICD but was removed December 2019 due to infection.  Neurology on board, monitor electrolytes closely.  On Coreg and amiodarone.   Hyperglycemia - Serum glucose in the ED 255.  A1c was 5.7 likely stress related hyperglycemia.  On low-dose sliding scale for now.  CBG (last 3)  Recent Labs    01/31/19 0020 01/31/19 0452 01/31/19 0801  GLUCAP 102* 131* 138*    Dementia with Toxic Encephalopathy -   Pleasantly confused.  Occasional agitation noted.  Improved when his wife is at bedside.    Goals of care - Seen by palliative medicine.  Full scope of treatment to continue.   DVT prophylaxis: SCD Code Status: Full Family Communication: Discussed with patient's daughter yesterday and with his wife today.   Disposition Plan: Unclear for now.  Possibly may need to go to skilled nursing facility when medically ready for discharge.  May end up undergoing cholecystectomy early next week.  Will stay here in the hospital through the weekend.   Consultants:  GI CCS ID Cards  Procedures/Significant Events:  4/12 PCXR: LEFT basilar atelectasis/early infiltrate 4/12 US abdomen RUQ:-Mild gallbladder wall thickening, probably chronic sequelae of cholecystitis and cholecystostomy tube.  -Cholelithiasis again noted -No intra-/extrahepatic biliary ductal dilation noted  4/13 cholecystostomy tube exchange with fluoroscopy:-Innumerable gallstones. Unable to opacify the common bile duct due to obstruction of the cystic duct. -Purulent looking fluid removed from the gallbladder. Fluid sent for culture. 4/16: Drain check and placement of new suture 4/17 ERCP: - Choledocholithiasis, treated with biliary sphincterotomy  and balloon sweeping. Cystic duct did not fill with contrast, suggesting continued obstruction.   LINES / TUBES:  Cholecystostomy tube/13>>  Cultures 4/12 blood RIGHT antecubital NGTD 4/12 blood counts right hand NGTD  4/13 gallbladder positive Enterobacter    Antimicrobials: Anti-infectives (From admission, onward)   Start     Stop   01/25/19 2200  vancomycin (VANCOCIN) 1,500 mg in sodium chloride 0.9 % 500 mL IVPB  Status:  Discontinued     01/26/19 1124   01/25/19 0200  ceFEPIme (MAXIPIME) 2 g in sodium chloride 0.9 % 100 mL IVPB         01/24/19 2200  ceFEPIme (MAXIPIME) 2 g in sodium chloride 0.9 % 100 mL IVPB  Status:  Discontinued     01/24/19 2208   01/24/19 2200  metroNIDAZOLE (FLAGYL) IVPB 500 mg         01/24/19 1945  piperacillin-tazobactam (ZOSYN) IVPB 3.375 g     01/24/19 2017   01/24/19 1930  ceFEPIme (MAXIPIME) 2 g in sodium chloride 0.9 % 100 mL IVPB  Status:  Discontinued     01/24/19 2010   01/24/19 1930  metroNIDAZOLE (FLAGYL) IVPB 500 mg  Status:  Discontinued     01/24/19 1931   01/24/19 1930  vancomycin (VANCOCIN) IVPB 1000 mg/200 mL premix  Status:  Discontinued     01/24/19 1922   01/24/19 1930  vancomycin (  VANCOCIN) 2,000 mg in sodium chloride 0.9 % 500 mL IVPB     01/25/19 0012       Continuous Infusions:  sodium chloride 250 mL (01/30/19 1736)   ceFEPime (MAXIPIME) IV 2 g (01/31/19 0643)   dextrose 5 % with kcl 75 mL/hr at 01/31/19 0839   metronidazole 500 mg (01/31/19 6812)     Objective: Vitals:   01/30/19 2033 01/31/19 0030 01/31/19 0433 01/31/19 0830  BP: 99/81 102/67 106/75 106/71  Pulse: 94 93 90 92  Resp: 20 18 16 15   Temp: 98.2 F (36.8 C) 98.3 F (36.8 C) 97.7 F (36.5 C) 97.9 F (36.6 C)  TempSrc: Oral Axillary Axillary Axillary  SpO2: 97% 99% 99% 100%  Weight:   92 kg   Height:        Intake/Output Summary (Last 24 hours) at 01/31/2019 1045 Last data filed at 01/31/2019 0643 Gross per 24 hour  Intake 1293.7 ml    Output 1627 ml  Net -333.3 ml   Filed Weights   01/29/19 0805 01/30/19 0607 01/31/19 0433  Weight: 88.4 kg 91.6 kg 92 kg   Exam  Confused, No new F.N deficits, Normal affect [place].AT,PERRAL Supple Neck,No JVD, No cervical lymphadenopathy appriciated.  Symmetrical Chest wall movement, Good air movement bilaterally, CTAB RRR,No Gallops, Rubs or new Murmurs, No Parasternal Heave +ve B.Sounds, Abd Soft, R. GB drain in place, No tenderness, No organomegaly appriciated, No rebound - guarding or rigidity. No Cyanosis, Clubbing or edema, No new Rash or bruise   CBC: Recent Labs  Lab 01/24/19 1935 01/25/19 0305  01/27/19 0425 01/28/19 0347 01/29/19 0340 01/30/19 0346 01/31/19 0240  WBC 11.1* 10.6*   < > 5.6 5.7 6.7 6.9 6.9  NEUTROABS 10.0* 9.0*  --   --   --   --   --   --   HGB 12.8* 12.0*   < > 11.1* 11.6* 11.9* 11.1* 11.0*  HCT 36.6* 35.9*   < > 34.2* 35.4* 35.3* 33.5* 33.4*  MCV 89.9 89.8   < > 92.9 91.5 91.9 93.1 93.8  PLT 146* 132*   < > 124* 131* 127* 118* 122*   < > = values in this interval not displayed.   Basic Metabolic Panel: Recent Labs  Lab 01/27/19 0425 01/28/19 0347 01/29/19 0340 01/30/19 0346 01/31/19 0240  NA 139 134* 136 139 138  K 3.0* 3.3* 3.8 3.9 3.4*  CL 105 105 105 109 108  CO2 22 20* 26 22 19*  GLUCOSE 115* 118* 131* 111* 122*  BUN 18 13 12 15 15   CREATININE 1.00 0.89 0.95 0.92 0.98  CALCIUM 8.4* 8.1* 8.4* 8.4* 8.1*  MG 2.1 2.0 2.0 2.0 2.0   GFR: Estimated Creatinine Clearance: 58.3 mL/min (by C-G formula based on SCr of 0.98 mg/dL). Liver Function Tests: Recent Labs  Lab 01/27/19 0425 01/28/19 0347 01/29/19 0340 01/30/19 0346 01/31/19 0240  AST 52* 33 27 22 19   ALT 119* 83* 62* 44 34  ALKPHOS 373* 365* 308* 258* 222*  BILITOT 1.7* 1.4* 1.5* 1.3* 1.2  PROT 6.1* 6.1* 6.2* 6.3* 6.0*  ALBUMIN 2.3* 2.3* 2.3* 2.3* 2.3*   Recent Labs  Lab 01/24/19 1935  LIPASE 51   Recent Labs  Lab 01/24/19 2317  AMMONIA 26   Coagulation  Profile: Recent Labs  Lab 01/24/19 2218  INR 1.2   Cardiac Enzymes: Recent Labs  Lab 01/24/19 2154  TROPONINI 0.04*   HbA1C: No results for input(s): HGBA1C in the last 72 hours.  CBG: Recent Labs  Lab 01/30/19 1700 01/30/19 2108 01/31/19 0020 01/31/19 0452 01/31/19 0801  GLUCAP 155* 172* 102* 131* 138*   Lipid Profile: No results for input(s): CHOL, HDL, LDLCALC, TRIG, CHOLHDL, LDLDIRECT in the last 72 hours. Urine analysis:    Component Value Date/Time   COLORURINE AMBER (A) 01/24/2019 2138   APPEARANCEUR HAZY (A) 01/24/2019 2138   LABSPEC 1.018 01/24/2019 2138   PHURINE 5.0 01/24/2019 2138   GLUCOSEU 50 (A) 01/24/2019 2138   HGBUR SMALL (A) 01/24/2019 2138   BILIRUBINUR SMALL (A) 01/24/2019 2138   KETONESUR NEGATIVE 01/24/2019 2138   PROTEINUR NEGATIVE 01/24/2019 2138   NITRITE NEGATIVE 01/24/2019 2138   LEUKOCYTESUR LARGE (A) 01/24/2019 2138    Recent Results (from the past 240 hour(s))  Blood Culture (routine x 2)     Status: None   Collection Time: 01/24/19  7:15 PM  Result Value Ref Range Status   Specimen Description BLOOD RIGHT ANTECUBITAL  Final   Special Requests   Final    BOTTLES DRAWN AEROBIC AND ANAEROBIC Blood Culture results may not be optimal due to an inadequate volume of blood received in culture bottles   Culture   Final    NO GROWTH 5 DAYS Performed at [facility], [place] [address] [place], [place] [address]    Report Status 01/29/2019 FINAL  Final  Blood Culture (routine x 2)     Status: None   Collection Time: 01/24/19  7:36 PM  Result Value Ref Range Status   Specimen Description BLOOD RIGHT HAND  Final   Special Requests   Final    BOTTLES DRAWN AEROBIC AND ANAEROBIC Blood Culture results may not be optimal due to an inadequate volume of blood received in culture bottles   Culture   Final    NO GROWTH 5 DAYS Performed at [facility], [place] [address], [place], [place] [address]    Report Status 01/29/2019 FINAL  Final   Aerobic/Anaerobic Culture (surgical/deep wound)     Status: None (Preliminary result)   Collection Time: 01/25/19  2:57 PM  Result Value Ref Range Status   Specimen Description GALL BLADDER  Final   Special Requests NONE  Final   Gram Stain   Final    ABUNDANT WBC PRESENT, PREDOMINANTLY PMN MODERATE GRAM NEGATIVE RODS RARE GRAM POSITIVE COCCI Performed at [facility], [place] [address], [place], [place] [address]    Culture   Final    ABUNDANT ENTEROBACTER HORMAECHEI RARE ENTEROCOCCUS FAECALIS    Report Status PENDING  Incomplete   Organism ID, Bacteria ENTEROBACTER HORMAECHEI  Final      Susceptibility   Enterobacter hormaechei - MIC*    CEFAZOLIN >=64 RESISTANT Resistant     CEFEPIME <=1 SENSITIVE Sensitive     CEFTAZIDIME >=64 RESISTANT Resistant     CEFTRIAXONE >=64 RESISTANT Resistant     CIPROFLOXACIN 0.5 SENSITIVE Sensitive     GENTAMICIN <=1 SENSITIVE Sensitive     IMIPENEM 0.5 SENSITIVE Sensitive     TRIMETH/SULFA <=20 SENSITIVE Sensitive     PIP/TAZO >=128 RESISTANT Resistant     * ABUNDANT ENTEROBACTER HORMAECHEI         Radiology Studies: Dg Ercp Biliary & Pancreatic Ducts  Result Date: 01/29/2019 CLINICAL DATA:  85 year old male with choledocholithiasis undergoing ERCP. EXAM: ERCP TECHNIQUE: Multiple spot images obtained with the fluoroscopic device and submitted for interpretation post-procedure. FLUOROSCOPY TIME:  Fluoroscopy Time:  3 minutes 14 seconds. COMPARISON:  MRCP 01/27/2019. FINDINGS:  A total of 5 intraoperative spot views are submitted for review. The images demonstrate a flexible endoscope in the descending duodenum followed by wire cannulation of the common bile duct and cholangiogram. Multiple filling defects are present throughout the common bile duct consistent with choledocholithiasis. Subsequent images demonstrate sphincterotomy and balloon sweeping of the common duct. A pigtail drainage catheter is also evident in the right upper quadrant  consistent with the recently placed percutaneous cholecystostomy tube. IMPRESSION: ERCP with sphincterotomy and balloon sweep of the common duct. These images were submitted for radiologic interpretation only. Please see the procedural report for the amount of contrast and the fluoroscopy time utilized. Electronically Signed   By: Jacqulynn Cadet M.D.   On: 01/29/2019 11:27        Scheduled Meds:  amiodarone  100 mg Oral Daily   carvedilol  3.125 mg Oral BID WC   insulin aspart  0-9 Units Subcutaneous Q4H   pantoprazole (PROTONIX) IV  40 mg Intravenous Q24H   sodium chloride flush  3 mL Intravenous Q12H   sodium chloride flush  3 mL Intravenous Q12H   sodium chloride flush  5 mL Intracatheter Q8H   Continuous Infusions:  sodium chloride 250 mL (01/30/19 1736)   ceFEPime (MAXIPIME) IV 2 g (01/31/19 0643)   dextrose 5 % with kcl 75 mL/hr at 01/31/19 0839   metronidazole 500 mg (01/31/19 0918)     LOS: 7 days   Signature  Lala Lund M.D on 01/31/2019 at 10:45 AM   -  To page go to [email or web address]           PROGRESS NOTE    William [name]  LFY:[id number] DOB: Jul 31, 1928 DOA: 01/24/2019 PCP: Gayland Curry, DO   Brief Narrative:  85 y.o. WM PMHx Dementia, AAA, aortic stenosis status post TAVR, chronic systolic CHF pulmonary nodules, chronic systolic CHF, history of VT with AICD that was removed due to infection, cholecystitis and cholangitis with biliary drain in place, debility at SNF, now presenting to the emergency department for evaluation of lethargy, fevers, and vomiting.  Patient is unable to contribute much to the history due to his clinical condition.  Per report of the patient's daughter and nursing facility personnel, there has been decreasing output from the patient's biliary drain and he has developed nausea over the past couple days with very poor intake, progressing to lethargy today.  He was reportedly pale, slumped over, and vomiting some  brownish material.  He was noted to have a fever to 100.7 F, tachycardic in the 130s, and blood pressure stable initially.  He has not been coughing and has not appeared dyspneic or requiring any oxygen per report.  He was sent into the ED for evaluation of his change in condition.   ED Course: Upon arrival to the ED, patient is found to be febrile to 38.2 C, saturating well on room air, tachycardic in the 120s, initial blood pressure 83 systolic, and normal respiratory rate.  EKG features a sinus tachycardia with chronic LBBB.  Chest x-ray is notable for patchy left basilar atelectasis versus early infiltrate.  Chemistry panel notable for sodium 133, glucose 255, alkaline phosphatase 595, AST and ALT in the 300 range, and total bilirubin of 4.  CBC features a leukocytosis to 11,100 and a slight thrombocytopenia.  Lactic acid is elevated to 2.7.  Blood cultures were collected in the ED, 250 cc normal saline bolus was given, and the patient was treated with vancomycin and Zosyn.  General surgery was  consulted by the ED clinician, recommended consultation with GI, ED PA reports that she is awaiting for callback from GI at this time and requests a medical admission.   Subjective: Shin in bed, somewhat confused, in no distress denies any headache chest or abdominal pain but is a poor historian.   Assessment & Plan:    Severe sepsis with Enterobacter bacteremia.  Due to choledocholithiasis and ascending cholangitis, status post biliary drain placement on 01/25/2019, GI general surgery and ID following.  Currently on cefepime and Flagyl IV.  Bowel rest.  Will place on gentle hydration.  Status post ERCP results below with very sphincterectomy and balloon sweep, however there is suspicion of continued obstruction per Dr. Ardis Hughs.  Will defer further management to general surgery and GI.  Moderate risk candidate for any open surgical procedure per cardiology.  Sepsis pathophysiology has resolved. DW with GI Dr Benson Norway  on 4/18.   Cholelithiasis/occluded cholecystostomy tube - 4/13 cholecystostomy tube exchanged - Numerous stones removed along with purulent drainage which was sent for culture. - Cystic duct occluded. He underwent ERCP this morning with sphincterotomy. Previous MD discussed in detail with Dr. Redmond Pulling with general surgery on 4/16.  Cardiology was consulted for risk stratification.  Patient considered moderate to severe risk of cardiac complications if he undergoes cholecystectomy.  All of this was discussed in detail with patient's family members including his wife as well as daughter by Dr Maryland Pink.  Plan is to revisit this on Monday.  Continue current treatment for now.  Acute encephalopathy Patient pleasantly confused.  No focal neurological deficits.  He does tend to get agitated at times.   Chronic systolic CHF/aortic stenosis status post TAVR -EF 25 to 30% with diffuse hypokinesis, mild to moderate MR, TAVR -Echocardiogram February 2020: EF 25-30% with diffuse hypokinesis, mild-moderate MR, TAVR, negative endocarditis.Marland Kitchen  Plavix is on hold. Currently stable from a cardiac standpoint.  CAD Minimal CAD was noted on cardiac catheterization done in September 2019. Chronic LBBB noted on EKG.  Plavix is on hold.  OSA - CPAP per respiratory  History of ventricular tachycardia - Patient had AICD but was removed December 2019 due to infection.  Occasional episodes of nonsustained VT noted.  Potassium 3.8 today.  Magnesium 2.0.  We will give additional dose of potassium later today.   Hyperglycemia - Serum glucose in the ED 255.  CBGs have been reasonably well controlled subsequently. - Hemoglobin A1c 5.7 -LDL 100 - sensitive SSI  Dementia with Toxic Encephalopathy -  - Pleasantly confused.  Occasional agitation noted.  Improved when his wife is at bedside.    Goals of care Seen by palliative medicine.  Full scope of treatment to continue.   DVT prophylaxis: SCD Code Status:  Full Family Communication: Discussed with patient's daughter yesterday and with his wife today.   Disposition Plan: Unclear for now.  Possibly may need to go to skilled nursing facility when medically ready for discharge.  May end up undergoing cholecystectomy early next week.  Will stay here in the hospital through the weekend.   Consultants:  GI CCS ID  Procedures/Significant Events:  4/12 PCXR: LEFT basilar atelectasis/early infiltrate 4/12 US abdomen RUQ:-Mild gallbladder wall thickening, probably chronic sequelae of cholecystitis and cholecystostomy tube.  -Cholelithiasis again noted -No intra-/extrahepatic biliary ductal dilation noted  4/13 cholecystostomy tube exchange with fluoroscopy:-Innumerable gallstones. Unable to opacify the common bile duct due to obstruction of the cystic duct. -Purulent looking fluid removed from the gallbladder. Fluid sent for culture. 4/16: Drain  check and placement of new suture 4/17 ERCP: - Choledocholithiasis, treated with biliary sphincterotomy and balloon sweeping. Cystic duct did not fill with contrast, suggesting continued obstruction.   LINES / TUBES:  Cholecystostomy tube/13>>  Cultures 4/12 blood RIGHT antecubital NGTD 4/12 blood counts right hand NGTD  4/13 gallbladder positive Enterobacter    Antimicrobials: Anti-infectives (From admission, onward)   Start     Stop   01/25/19 2200  vancomycin (VANCOCIN) 1,500 mg in sodium chloride 0.9 % 500 mL IVPB  Status:  Discontinued     01/26/19 1124   01/25/19 0200  ceFEPIme (MAXIPIME) 2 g in sodium chloride 0.9 % 100 mL IVPB         01/24/19 2200  ceFEPIme (MAXIPIME) 2 g in sodium chloride 0.9 % 100 mL IVPB  Status:  Discontinued     01/24/19 2208   01/24/19 2200  metroNIDAZOLE (FLAGYL) IVPB 500 mg         01/24/19 1945  piperacillin-tazobactam (ZOSYN) IVPB 3.375 g     01/24/19 2017   01/24/19 1930  ceFEPIme (MAXIPIME) 2 g in sodium chloride 0.9 % 100 mL IVPB  Status:  Discontinued      01/24/19 2010   01/24/19 1930  metroNIDAZOLE (FLAGYL) IVPB 500 mg  Status:  Discontinued     01/24/19 1931   01/24/19 1930  vancomycin (VANCOCIN) IVPB 1000 mg/200 mL premix  Status:  Discontinued     01/24/19 1922   01/24/19 1930  vancomycin (VANCOCIN) 2,000 mg in sodium chloride 0.9 % 500 mL IVPB     01/25/19 0012       Continuous Infusions:  sodium chloride 250 mL (01/30/19 1736)   ceFEPime (MAXIPIME) IV 2 g (01/31/19 0643)   dextrose 5 % with kcl 75 mL/hr at 01/31/19 0839   metronidazole 500 mg (01/31/19 0918)     Objective: Vitals:   01/30/19 2033 01/31/19 0030 01/31/19 0433 01/31/19 0830  BP: 99/81 102/67 106/75 106/71  Pulse: 94 93 90 92  Resp: 20 18 16 15   Temp: 98.2 F (36.8 C) 98.3 F (36.8 C) 97.7 F (36.5 C) 97.9 F (36.6 C)  TempSrc: Oral Axillary Axillary Axillary  SpO2: 97% 99% 99% 100%  Weight:   92 kg   Height:        Intake/Output Summary (Last 24 hours) at 01/31/2019 1046 Last data filed at 01/31/2019 0643 Gross per 24 hour  Intake 1293.7 ml  Output 1627 ml  Net -333.3 ml   Filed Weights   01/29/19 0805 01/30/19 0607 01/31/19 0433  Weight: 88.4 kg 91.6 kg 92 kg   Exam  Confused, No new F.N deficits, Normal affect [place].AT,PERRAL Supple Neck,No JVD, No cervical lymphadenopathy appriciated.  Symmetrical Chest wall movement, Good air movement bilaterally, CTAB RRR,No Gallops, Rubs or new Murmurs, No Parasternal Heave +ve B.Sounds, Abd Soft, R. GB drain in place, No tenderness, No organomegaly appriciated, No rebound - guarding or rigidity. No Cyanosis, Clubbing or edema, No new Rash or bruise   CBC: Recent Labs  Lab 01/24/19 1935 01/25/19 0305  01/27/19 0425 01/28/19 0347 01/29/19 0340 01/30/19 0346 01/31/19 0240  WBC 11.1* 10.6*   < > 5.6 5.7 6.7 6.9 6.9  NEUTROABS 10.0* 9.0*  --   --   --   --   --   --   HGB 12.8* 12.0*   < > 11.1* 11.6* 11.9* 11.1* 11.0*  HCT 36.6* 35.9*   < > 34.2* 35.4* 35.3* 33.5* 33.4*  MCV 89.9 89.8   <  >  92.9 91.5 91.9 93.1 93.8  PLT 146* 132*   < > 124* 131* 127* 118* 122*   < > = values in this interval not displayed.   Basic Metabolic Panel: Recent Labs  Lab 01/27/19 0425 01/28/19 0347 01/29/19 0340 01/30/19 0346 01/31/19 0240  NA 139 134* 136 139 138  K 3.0* 3.3* 3.8 3.9 3.4*  CL 105 105 105 109 108  CO2 22 20* 26 22 19*  GLUCOSE 115* 118* 131* 111* 122*  BUN 18 13 12 15 15   CREATININE 1.00 0.89 0.95 0.92 0.98  CALCIUM 8.4* 8.1* 8.4* 8.4* 8.1*  MG 2.1 2.0 2.0 2.0 2.0   GFR: Estimated Creatinine Clearance: 58.3 mL/min (by C-G formula based on SCr of 0.98 mg/dL). Liver Function Tests: Recent Labs  Lab 01/27/19 0425 01/28/19 0347 01/29/19 0340 01/30/19 0346 01/31/19 0240  AST 52* 33 27 22 19   ALT 119* 83* 62* 44 34  ALKPHOS 373* 365* 308* 258* 222*  BILITOT 1.7* 1.4* 1.5* 1.3* 1.2  PROT 6.1* 6.1* 6.2* 6.3* 6.0*  ALBUMIN 2.3* 2.3* 2.3* 2.3* 2.3*   Recent Labs  Lab 01/24/19 1935  LIPASE 51   Recent Labs  Lab 01/24/19 2317  AMMONIA 26   Coagulation Profile: Recent Labs  Lab 01/24/19 2218  INR 1.2   Cardiac Enzymes: Recent Labs  Lab 01/24/19 2154  TROPONINI 0.04*   HbA1C: No results for input(s): HGBA1C in the last 72 hours. CBG: Recent Labs  Lab 01/30/19 1700 01/30/19 2108 01/31/19 0020 01/31/19 0452 01/31/19 0801  GLUCAP 155* 172* 102* 131* 138*   Lipid Profile: No results for input(s): CHOL, HDL, LDLCALC, TRIG, CHOLHDL, LDLDIRECT in the last 72 hours. Urine analysis:    Component Value Date/Time   COLORURINE AMBER (A) 01/24/2019 2138   APPEARANCEUR HAZY (A) 01/24/2019 2138   LABSPEC 1.018 01/24/2019 2138   PHURINE 5.0 01/24/2019 2138   GLUCOSEU 50 (A) 01/24/2019 2138   HGBUR SMALL (A) 01/24/2019 2138   BILIRUBINUR SMALL (A) 01/24/2019 2138   KETONESUR NEGATIVE 01/24/2019 2138   PROTEINUR NEGATIVE 01/24/2019 2138   NITRITE NEGATIVE 01/24/2019 2138   LEUKOCYTESUR LARGE (A) 01/24/2019 2138    Recent Results (from the past 240  hour(s))  Blood Culture (routine x 2)     Status: None   Collection Time: 01/24/19  7:15 PM  Result Value Ref Range Status   Specimen Description BLOOD RIGHT ANTECUBITAL  Final   Special Requests   Final    BOTTLES DRAWN AEROBIC AND ANAEROBIC Blood Culture results may not be optimal due to an inadequate volume of blood received in culture bottles   Culture   Final    NO GROWTH 5 DAYS Performed at [facility], [place] [address] [place], [place] [address]    Report Status 01/29/2019 FINAL  Final  Blood Culture (routine x 2)     Status: None   Collection Time: 01/24/19  7:36 PM  Result Value Ref Range Status   Specimen Description BLOOD RIGHT HAND  Final   Special Requests   Final    BOTTLES DRAWN AEROBIC AND ANAEROBIC Blood Culture results may not be optimal due to an inadequate volume of blood received in culture bottles   Culture   Final    NO GROWTH 5 DAYS Performed at [facility], [place] [address] [place], [place] [address]    Report Status 01/29/2019 FINAL  Final  Aerobic/Anaerobic Culture (surgical/deep wound)     Status: None (Preliminary result)  Collection Time: 01/25/19  2:57 PM  Result Value Ref Range Status   Specimen Description GALL BLADDER  Final   Special Requests NONE  Final   Gram Stain   Final    ABUNDANT WBC PRESENT, PREDOMINANTLY PMN MODERATE GRAM NEGATIVE RODS RARE GRAM POSITIVE COCCI Performed at [facility], [place] [address], [place], [place] [address]    Culture   Final    ABUNDANT ENTEROBACTER HORMAECHEI RARE ENTEROCOCCUS FAECALIS    Report Status PENDING  Incomplete   Organism ID, Bacteria ENTEROBACTER HORMAECHEI  Final      Susceptibility   Enterobacter hormaechei - MIC*    CEFAZOLIN >=64 RESISTANT Resistant     CEFEPIME <=1 SENSITIVE Sensitive     CEFTAZIDIME >=64 RESISTANT Resistant     CEFTRIAXONE >=64 RESISTANT Resistant     CIPROFLOXACIN 0.5 SENSITIVE Sensitive     GENTAMICIN <=1 SENSITIVE Sensitive     IMIPENEM 0.5  SENSITIVE Sensitive     TRIMETH/SULFA <=20 SENSITIVE Sensitive     PIP/TAZO >=128 RESISTANT Resistant     * ABUNDANT ENTEROBACTER HORMAECHEI         Radiology Studies: Dg Ercp Biliary & Pancreatic Ducts  Result Date: 01/29/2019 CLINICAL DATA:  85 year old male with choledocholithiasis undergoing ERCP. EXAM: ERCP TECHNIQUE: Multiple spot images obtained with the fluoroscopic device and submitted for interpretation post-procedure. FLUOROSCOPY TIME:  Fluoroscopy Time:  3 minutes 14 seconds. COMPARISON:  MRCP 01/27/2019. FINDINGS: A total of 5 intraoperative spot views are submitted for review. The images demonstrate a flexible endoscope in the descending duodenum followed by wire cannulation of the common bile duct and cholangiogram. Multiple filling defects are present throughout the common bile duct consistent with choledocholithiasis. Subsequent images demonstrate sphincterotomy and balloon sweeping of the common duct. A pigtail drainage catheter is also evident in the right upper quadrant consistent with the recently placed percutaneous cholecystostomy tube. IMPRESSION: ERCP with sphincterotomy and balloon sweep of the common duct. These images were submitted for radiologic interpretation only. Please see the procedural report for the amount of contrast and the fluoroscopy time utilized. Electronically Signed   By: Jacqulynn Cadet M.D.   On: 01/29/2019 11:27        Scheduled Meds:  amiodarone  100 mg Oral Daily   carvedilol  3.125 mg Oral BID WC   insulin aspart  0-9 Units Subcutaneous Q4H   pantoprazole (PROTONIX) IV  40 mg Intravenous Q24H   sodium chloride flush  3 mL Intravenous Q12H   sodium chloride flush  3 mL Intravenous Q12H   sodium chloride flush  5 mL Intracatheter Q8H   Continuous Infusions:  sodium chloride 250 mL (01/30/19 1736)   ceFEPime (MAXIPIME) IV 2 g (01/31/19 0643)   dextrose 5 % with kcl 75 mL/hr at 01/31/19 0839   metronidazole 500 mg (01/31/19  0918)     LOS: 7 days   Signature  Lala Lund M.D on 01/31/2019 at 10:46 AM   -  To page go to [email or web address]

## 2019-01-31 NOTE — Progress Notes (Signed)
Progress Note  Patient Name: William [name] Date of Encounter: 01/31/2019  Primary Cardiologist: Sanda Klein, MD  Subjective   Confused no cardiac complaints   Inpatient Medications    Scheduled Meds:  amiodarone  100 mg Oral Daily   insulin aspart  0-9 Units Subcutaneous Q4H   pantoprazole (PROTONIX) IV  40 mg Intravenous Q24H   sodium chloride flush  3 mL Intravenous Q12H   sodium chloride flush  3 mL Intravenous Q12H   sodium chloride flush  5 mL Intracatheter Q8H   Continuous Infusions:  sodium chloride 250 mL (01/30/19 1736)   ceFEPime (MAXIPIME) IV 2 g (01/31/19 0643)   dextrose 5 % with kcl 75 mL/hr at 01/31/19 0839   metronidazole 500 mg (01/31/19 0201)   PRN Meds: sodium chloride, acetaminophen **OR** acetaminophen, iohexol, iohexol, lidocaine (PF), ondansetron **OR** ondansetron (ZOFRAN) IV, sodium chloride flush, sodium chloride flush   Vital Signs    Vitals:   01/30/19 2033 01/31/19 0030 01/31/19 0433 01/31/19 0830  BP: 99/81 102/67 106/75 106/71  Pulse: 94 93 90 92  Resp: 20 18 16 15   Temp: 98.2 F (36.8 C) 98.3 F (36.8 C) 97.7 F (36.5 C) 97.9 F (36.6 C)  TempSrc: Oral Axillary Axillary Axillary  SpO2: 97% 99% 99% 100%  Weight:   92 kg   Height:        Intake/Output Summary (Last 24 hours) at 01/31/2019 0902 Last data filed at 01/31/2019 3762 Gross per 24 hour  Intake 1413.7 ml  Output 1627 ml  Net -213.3 ml   Last 3 Weights 01/31/2019 01/30/2019 01/29/2019  Weight (lbs) 202 lb 13.2 oz 201 lb 15.1 oz 194 lb 14.2 oz  Weight (kg) 92 kg 91.6 kg 88.4 kg     Telemetry    NSR PVC;s occassional   Physical Exam   Confused  Chronically ill white male  HEENT: normal Neck supple with no adenopathy JVP normal no bruits no thyromegaly Lungs clear with no wheezing and good diaphragmatic motion Heart:  S1/S2 SEM through TAVR valve no AR murmur, no rub, gallop or click PMI normal Abdomen: soft GB tube in place with dressing  no  bruit.  No HSM or HJR Distal pulses intact with no bruits No edema Neuro non-focal Skin warm and dry No muscular weakness   Labs    Chemistry Recent Labs  Lab 01/29/19 0340 01/30/19 0346 01/31/19 0240  NA 136 139 138  K 3.8 3.9 3.4*  CL 105 109 108  CO2 26 22 19*  GLUCOSE 131* 111* 122*  BUN 12 15 15   CREATININE 0.95 0.92 0.98  CALCIUM 8.4* 8.4* 8.1*  PROT 6.2* 6.3* 6.0*  ALBUMIN 2.3* 2.3* 2.3*  AST 27 22 19   ALT 62* 44 34  ALKPHOS 308* 258* 222*  BILITOT 1.5* 1.3* 1.2  GFRNONAA >60 >60 >60  GFRAA >60 >60 >60  ANIONGAP 5 8 11      Hematology Recent Labs  Lab 01/29/19 0340 01/30/19 0346 01/31/19 0240  WBC 6.7 6.9 6.9  RBC 3.84* 3.60* 3.56*  HGB 11.9* 11.1* 11.0*  HCT 35.3* 33.5* 33.4*  MCV 91.9 93.1 93.8  MCH 31.0 30.8 30.9  MCHC 33.7 33.1 32.9  RDW 15.6* 15.7* 15.8*  PLT 127* 118* 122*    Cardiac Enzymes Recent Labs  Lab 01/24/19 2154  TROPONINI 0.04*   No results for input(s): TROPIPOC in the last 168 hours.    Radiology    Dg Ercp Biliary & Pancreatic Ducts  Result Date:  01/29/2019 CLINICAL DATA:  85 year old male with choledocholithiasis undergoing ERCP. EXAM: ERCP TECHNIQUE: Multiple spot images obtained with the fluoroscopic device and submitted for interpretation post-procedure. FLUOROSCOPY TIME:  Fluoroscopy Time:  3 minutes 14 seconds. COMPARISON:  MRCP 01/27/2019. FINDINGS: A total of 5 intraoperative spot views are submitted for review. The images demonstrate a flexible endoscope in the descending duodenum followed by wire cannulation of the common bile duct and cholangiogram. Multiple filling defects are present throughout the common bile duct consistent with choledocholithiasis. Subsequent images demonstrate sphincterotomy and balloon sweeping of the common duct. A pigtail drainage catheter is also evident in the right upper quadrant consistent with the recently placed percutaneous cholecystostomy tube. IMPRESSION: ERCP with sphincterotomy and  balloon sweep of the common duct. These images were submitted for radiologic interpretation only. Please see the procedural report for the amount of contrast and the fluoroscopy time utilized. Electronically Signed   By: Jacqulynn Cadet M.D.   On: 01/29/2019 11:27    Patient Profile     85 y.o. male with history of severe aortic stenosis s/p TAVR in 06/16/2354, chronic systolic CHF/non-ischemic cardiomyopathy with history of ventricular tachycardia s/p AICD which has been removed due to infection (admission 11/12 with ESBL bacteremia nad Klebsiella lead vegetation), moderate CAD by cath 06/2018, 4.2cm x 5.2cm infrarenal AAA (sized by imaging this admission), dementia, obstructive sleep apnea, hyperlipidemia, severe deconditioning, degenerative arthritis/lumbar vertebral fracture with reduced mobility, cholecystitis and cholangitis with biliary drain in place. He was admitted 11/2018 with severe sepsis secondary to cholecystitis and Enterobacter bacteremia and acute respiratory failure requiring intubation and had perc drain placed. He was readmitted 01/24/19 with AMS and found to have severe biliary sepsis. He was initially felt to be a poor candidate for surgery but ID felt that without intervention he is at risk for recurrent infection and sepsis. Drain was found to be partially obstructed. ERCP scheduled for 4/17.  Assessment & Plan    1. Recurrent sepsis in setting of biliary disease -per surgery and GI post ERCP with tube placement high risk for surgery   2. NICM/chronic systolic CHF -  I/O negative add back low dose beta blocker as systolic BP over 732 mmgh  3. Ventricular tachycardia - remains on low dose amiodarone at this time.   4. Severe AS s/p TAVR - TEE 11/2018 showed normal AV prosthesis. Would resume plavix once all surgical procedures are done and certainly before d/c   5. Abnormal LFTs - improving.  6. Mild anemia/thrombocytopenia - appears relatively stable last several days.  7.  Hypokalemia - improved.  8. AAA - 4.2cm x 5.2cm imaged this admission. May not be appropriate to further follow given advanced age and comorbidities.  Patient remains full code per EMR. Given his comorbidities and advanced age with progressive decline this year would consider ongoing conversations surrounding goals of care.  Time spent reviewing chart and examining patient 30 minutes   For questions or updates, please contact [place] Please consult [email or web address] for contact info under Cardiology/STEMI.  Signed, Jenkins Rouge, MD 01/31/2019, 9:02 AM     I have seen and examined the patient along with Charlie Pitter, PA-C.  I have reviewed the chart, notes and new data.  I agree with PA/NP's note.  Key new complaints: seen in endoscopy just before ERCP; no CV complaints Key examination changes: appears chronically ill and debilitated, but able to lie fully flat without dyspnea. No clinical signs of hypervolemia. Key new findings / data: PVCs and only 3-beat  runs of NSVT on monitor  PLAN: For ERCP today. Hopefully, this will allow improvement. Cholecystectomy (which will probably require open laparotomy rather than laparoscopic approach due to previous colectomy) will be a higher risk endeavor. No plan to resume clopidogrel - we would have stopped it in a couple of weeks. Continue amiodarone, low dose. Attempts to discontinue this have uniformly led to worsening VT, but arrhythmia is well controlled on a very low dose of antiarrhythmic. Plan to resume beta blocker gradually as allowed by BP.  Sanda Klein, MD, [place] [phone] 01/31/2019, 9:02 AM

## 2019-02-01 ENCOUNTER — Ambulatory Visit (HOSPITAL_COMMUNITY): Payer: Medicare Other

## 2019-02-01 DIAGNOSIS — I472 Ventricular tachycardia: Secondary | ICD-10-CM

## 2019-02-01 LAB — CBC
HCT: 37.5 % — ABNORMAL LOW (ref 39.0–52.0)
Hemoglobin: 12.2 g/dL — ABNORMAL LOW (ref 13.0–17.0)
MCH: 29.5 pg (ref 26.0–34.0)
MCHC: 32.5 g/dL (ref 30.0–36.0)
MCV: 90.8 fL (ref 80.0–100.0)
Platelets: 128 10*3/uL — ABNORMAL LOW (ref 150–400)
RBC: 4.13 MIL/uL — ABNORMAL LOW (ref 4.22–5.81)
RDW: 15.5 % (ref 11.5–15.5)
WBC: 6.8 10*3/uL (ref 4.0–10.5)
nRBC: 0 % (ref 0.0–0.2)

## 2019-02-01 LAB — AEROBIC/ANAEROBIC CULTURE W GRAM STAIN (SURGICAL/DEEP WOUND)

## 2019-02-01 LAB — GLUCOSE, CAPILLARY
Glucose-Capillary: 132 mg/dL — ABNORMAL HIGH (ref 70–99)
Glucose-Capillary: 154 mg/dL — ABNORMAL HIGH (ref 70–99)
Glucose-Capillary: 155 mg/dL — ABNORMAL HIGH (ref 70–99)
Glucose-Capillary: 170 mg/dL — ABNORMAL HIGH (ref 70–99)
Glucose-Capillary: 174 mg/dL — ABNORMAL HIGH (ref 70–99)
Glucose-Capillary: 79 mg/dL (ref 70–99)

## 2019-02-01 LAB — COMPREHENSIVE METABOLIC PANEL
ALT: 27 U/L (ref 0–44)
AST: 18 U/L (ref 15–41)
Albumin: 2.4 g/dL — ABNORMAL LOW (ref 3.5–5.0)
Alkaline Phosphatase: 209 U/L — ABNORMAL HIGH (ref 38–126)
Anion gap: 8 (ref 5–15)
BUN: 14 mg/dL (ref 8–23)
CO2: 22 mmol/L (ref 22–32)
Calcium: 8.2 mg/dL — ABNORMAL LOW (ref 8.9–10.3)
Chloride: 102 mmol/L (ref 98–111)
Creatinine, Ser: 0.97 mg/dL (ref 0.61–1.24)
GFR calc Af Amer: >60 mL/min (ref >60)
GFR calc non Af Amer: >60 mL/min (ref >60)
Glucose, Bld: 77 mg/dL (ref 70–99)
Potassium: 3.8 mmol/L (ref 3.5–5.1)
Sodium: 132 mmol/L — ABNORMAL LOW (ref 135–145)
Total Bilirubin: 1.2 mg/dL (ref 0.3–1.2)
Total Protein: 6.7 g/dL (ref 6.5–8.1)

## 2019-02-01 LAB — PROTIME-INR
INR: 1.2 (ref 0.8–1.2)
Prothrombin Time: 15 seconds (ref 11.4–15.2)

## 2019-02-01 LAB — MAGNESIUM: Magnesium: 2 mg/dL (ref 1.7–2.4)

## 2019-02-01 MED ORDER — POTASSIUM CHLORIDE 2 MEQ/ML IV SOLN
INTRAVENOUS | Status: AC
  Administered 2019-02-02 – 2019-02-03 (×2): via INTRAVENOUS
  Filled 2019-02-01 (×2): qty 1000

## 2019-02-01 MED ORDER — SODIUM CHLORIDE 0.9 % IV SOLN
1.0000 g | Freq: Three times a day (TID) | INTRAVENOUS | Status: DC
  Administered 2019-02-01 – 2019-02-08 (×22): 1 g via INTRAVENOUS
  Filled 2019-02-01 (×24): qty 1

## 2019-02-01 MED ORDER — HEPARIN SODIUM (PORCINE) 5000 UNIT/ML IJ SOLN
5000.0000 [IU] | Freq: Three times a day (TID) | INTRAMUSCULAR | Status: DC
  Administered 2019-02-01 – 2019-02-08 (×23): 5000 [IU] via SUBCUTANEOUS
  Filled 2019-02-01 (×23): qty 1

## 2019-02-01 MED ORDER — SODIUM CHLORIDE 0.9 % IV SOLN
2.0000 g | Freq: Two times a day (BID) | INTRAVENOUS | Status: DC
  Filled 2019-02-01 (×2): qty 2

## 2019-02-01 NOTE — Progress Notes (Signed)
Pt refuses cpap

## 2019-02-01 NOTE — Progress Notes (Signed)
3 Days Post-Op    CC:  cholangitis  Subjective: Pt is in bed and wife is very upset and anxious.  He is doing well from the GB standpoint.  No pain taking about 1/2 his diet, + BM's.  He is not talking to her at all and not drinking, which has her very upset.  She was asking if we could do the surgery with an epidural and not use general anesthesia.      Objective: Vital signs in last 24 hours: Temp:  [98 F (36.7 C)-98.7 F (37.1 C)] 98.7 F (37.1 C) (04/20 0802) Pulse Rate:  [92-96] 92 (04/20 0802) Resp:  [20-22] 20 (04/20 0802) BP: (102-112)/(71-77) 107/77 (04/20 0802) SpO2:  [97 %-98 %] 98 % (04/20 0802) Last BM Date: 01/30/19 120 PO 2200 IV 2300 urine 75 from drain BM x 1 Afebrile, VSS  HR stays in the 90's BP OK WBC 6.8 LFT's OK  Intake/Output from previous day: 04/19 0701 - 04/20 0700 In: 2248 [P.O.:120; I.V.:1313; IV Piggyback:800] Out: 2375 [Urine:2300; Drains:75] Intake/Output this shift: Total I/O In: 120 [P.O.:120] Out: -   General appearance: Pt is non verbal and wife says she has been this was since ERCP, last time they really talked was 01/29/19.  He is not drinking, but eating 1/2 his meals, wife is feeding him.  He is in [place].   GI: soft non tender, + BS, + BM, + flatus.  No pain on exam.    Lab Results:  Recent Labs    01/31/19 0240 02/01/19 0404  WBC 6.9 6.8  HGB 11.0* 12.2*  HCT 33.4* 37.5*  PLT 122* 128*    BMET Recent Labs    01/31/19 0240 02/01/19 0404  NA 138 132*  K 3.4* 3.8  CL 108 102  CO2 19* 22  GLUCOSE 122* 77  BUN 15 14  CREATININE 0.98 0.97  CALCIUM 8.1* 8.2*   PT/INR Recent Labs    02/01/19 0404  LABPROT 15.0  INR 1.2    Recent Labs  Lab 01/28/19 0347 01/29/19 0340 01/30/19 0346 01/31/19 0240 02/01/19 0404  AST 33 '27 22 19 18  ' ALT 83* 62* 44 34 27  ALKPHOS 365* 308* 258* 222* 209*  BILITOT 1.4* 1.5* 1.3* 1.2 1.2  PROT 6.1* 6.2* 6.3* 6.0* 6.7  ALBUMIN 2.3* 2.3* 2.3* 2.3* 2.4*     Lipase      Component Value Date/Time   LIPASE 51 01/24/2019 1935     Medications: . amiodarone  100 mg Oral Daily  . carvedilol  3.125 mg Oral BID WC  . heparin injection (subcutaneous)  5,000 Units Subcutaneous Q8H  . insulin aspart  0-9 Units Subcutaneous Q4H  . pantoprazole (PROTONIX) IV  40 mg Intravenous Q24H  . sodium chloride flush  3 mL Intravenous Q12H  . sodium chloride flush  3 mL Intravenous Q12H  . sodium chloride flush  5 mL Intracatheter Q8H   Scheduled Meds: . amiodarone  100 mg Oral Daily  . carvedilol  3.125 mg Oral BID WC  . heparin injection (subcutaneous)  5,000 Units Subcutaneous Q8H  . insulin aspart  0-9 Units Subcutaneous Q4H  . pantoprazole (PROTONIX) IV  40 mg Intravenous Q24H  . sodium chloride flush  3 mL Intravenous Q12H  . sodium chloride flush  3 mL Intravenous Q12H  . sodium chloride flush  5 mL Intracatheter Q8H   Continuous Infusions: . sodium chloride 250 mL (01/30/19 1736)  . ceFEPime (MAXIPIME) IV    .  dextrose 5 % with kcl 50 mL/hr at 02/01/19 0845  . metronidazole 500 mg (02/01/19 0137)   PRN Meds:.sodium chloride, acetaminophen **OR** acetaminophen, iohexol, iohexol, lidocaine (PF), ondansetron **OR** ondansetron (ZOFRAN) IV, sodium chloride flush, sodium chloride flush   Assessment/Plan Dementia AAA Aortic stenosis status post TAVR Pulmonary nodules Chronic systolic CHF Hstory of VT withAICD that was removed due to infection Cholecystitis and cholangitis with biliary drain in place02/2020 Hx of Multiple abdominal surgeries  Severe sepsis with Enterobacter bacteremia Choledocholithiasis  - Cholecystostomy tube  11/22/18; IR exchange 12/18/18, and 01/05/19, confirmed placement/tube exchange by IR 01/25/19 - s/p ERCP 01/29/19, Dr. Ardis Hughs, with findings of choledocholithiasis, treated with biliary sphincterotomy and balloon sweeping. Cystic duct did not fill with contrast - Cardiology places patient at moderate to high risk - AST normal. ALT and  alk phos downtrending - No indication for emergent or urgent surgery at this time. Will continue to follow and reassess early next week.  - Dr. Redmond Pulling had a long discussion with patients daughter 01/28/19. Please see his note for further details. - Wife at bedside and all questions answered  FEN - Soft diet, IV fluids VTE - SCD, okay for chemical prophylaxis from surgical standpoint ID - Cefepime/Flagyl 4/12 >> day 9  Plan:  Talked with Daughter Ebony Hail, and Wife Webb Silversmith.  Dr. Prince Rome ID was recommending surgery.  His wife and daughter are very concerned about the decline of his mentation.  They are concerned that his mentation is worse with the anesthesia used for the ERCP, and additional anesthesia for surgery, will make the situation even worse.    Dr. Prince Rome said something about doing surgery in 2 weeks to the family, but not sure where that 2 week window is.  ID said they would want to amend his antibiotics with surgery also. No hard decisions have been made about doing surgery. Family wants to wait a day or two and see if his mentation improves.  I will review with Dr. Redmond Pulling.         LOS: 8 days    Kingsley Farace 02/01/2019 [phone]

## 2019-02-01 NOTE — Progress Notes (Signed)
Pharmacy Antibiotic Note  William [name] is a 85 y.o. male admitted on 01/24/2019 with sepsis due to cholangitis with enterobacter now growing Enterococcus faecalis (amp-S). Discussed with Dr. Prince Rome, will switch from cefepime and flagyl to [facility]. WBC WNL, afebrile.  Plan: Stop cefepime and flagyl Start merrem 1g Q8hrs Follow up clinical s/sx, LOT  Height: 5\' 10"  (177.8 cm) Weight: 202 lb 13.2 oz (92 kg) IBW/kg (Calculated) : 73  Temp (24hrs), Avg:98.2 F (36.8 C), Min:98 F (36.7 C), Max:98.7 F (37.1 C)  Recent Labs  Lab 01/28/19 0347 01/29/19 0340 01/30/19 0346 01/31/19 0240 02/01/19 0404  WBC 5.7 6.7 6.9 6.9 6.8  CREATININE 0.89 0.95 0.92 0.98 0.97    Estimated Creatinine Clearance: 58.9 mL/min (by C-G formula based on SCr of 0.97 mg/dL).    Allergies  Allergen Reactions  . Crestor [Rosuvastatin] Other (See Comments)    Hurts muscles  . Lipitor [Atorvastatin] Other (See Comments)    Hurts stomach  . Shrimp [Shellfish Allergy] Other (See Comments)    On MAR    Antimicrobials this admission: Vanc 4/12>>4/14 Cefepime 4/12>>4/20 Flagyl 4/12>>4/20 Zosyn x 1 4/12 merrem 4/20>>  Microbiology results: 4/13: Gallbladder cxs: enterobacter hormaechei (R-Ancef/Fortaz/CTX/Zosyn; S-Cefepime/Cipro/Imipenem) Enterococcus faecalis (amp-S) 4/12: Blood cxs: negative   Juanell Fairly, PharmD PGY1 Pharmacy Resident Please utilize Amion for appropriate phone number to reach the unit pharmacist ([place]) 02/01/2019 11:55 AM

## 2019-02-01 NOTE — Progress Notes (Signed)
PROGRESS NOTE    William [name]  ZOX:[id number] DOB: 1928/03/24 DOA: 01/24/2019 PCP: Gayland Curry, DO   Brief Narrative:  85 y.o. WM PMHx Dementia, AAA, aortic stenosis status post TAVR, chronic systolic CHF pulmonary nodules, chronic systolic CHF, history of VT with AICD that was removed due to infection, cholecystitis and cholangitis with biliary drain in place, debility at SNF, now presenting to the emergency department for evaluation of lethargy, fevers, and vomiting.  Patient is unable to contribute much to the history due to his clinical condition.  Per report of the patient's daughter and nursing facility personnel, there has been decreasing output from the patient's biliary drain and he had developed nausea over the past couple days with very poor intake, progressing to lethargy today.    Was diagnosed with sepsis, further work-up showed Enterobacter bacteremia caused by cholecystostomy tube blockage.  Tube was revision by IR.  He was seen by GI, general surgery and cardiology.  He has undergone ERCP this admission, plan is to do cholecystectomy later this week.  Family agrees that patient is high risk candidate for adverse cardiopulmonary outcome and they accept the risks and benefits.   Subjective: patient in bed, is confused unable to answer questions or follow commands reliably..   Assessment & Plan:    Severe sepsis with Enterobacter bacteremia due to gallstone cholecystitis along with occluded cholecystostomy tube.  Due to choledocholithiasis and ascending cholangitis, status post biliary drain exchange  on 01/25/2019, GI general surgery and ID following.  Currently on cefepime and Flagyl IV.  Bowel rest.  Will place on gentle hydration.  Status post ERCP results below with very sphincterectomy and balloon sweep, however there is suspicion of continued obstruction of the cystic duct per Dr. Ardis Hughs.  Will defer further management to general surgery and GI.  Moderate risk candidate  for any open surgical procedure per cardiology.  Sepsis pathophysiology has resolved. DW with GI Dr Benson Norway on 4/18.   Cholelithiasis/occluded cholecystostomy tube - - See plan above.  Cardiology was consulted for risk stratification.  Patient considered moderate to severe risk of cardiac complications if he undergoes cholecystectomy.  All of this was discussed in detail with patient's family members including his wife as well as daughter by Dr Maryland Pink.  Plan is to revisit this on Monday by [facility]CS.  Continue current treatment for now.  Acute toxic encephalopathy - Patient pleasantly confused.  No focal neurological deficits.  He does tend to get agitated at times.  Underlying dementia as well.  Remains at risk for delirium.   Chronic systolic CHF/aortic stenosis status post TAVR - EF 25 to 30% with diffuse hypokinesis, mild to moderate MR, TAVR, Echocardiogram February 2020: EF 25-30% with diffuse hypokinesis, mild-moderate MR, TAVR, negative endocarditis.Marland Kitchen  Plavix is on hold.  Evaluated by cardiology moderate to high risk candidate for adverse cardiopulmonary outcome during any operative procedure.  Family understands this and accepts the risk.  CAD -  Minimal CAD was noted on cardiac catheterization done in September 2019. Chronic LBBB noted on EKG.  Plavix is on hold.  OSA - CPAP per respiratory  History of ventricular tachycardia  - Patient had AICD but was removed December 2019 due to infection.  Neurology on board, monitor electrolytes closely.  On Coreg and amiodarone.   Hyperglycemia - Serum glucose in the ED 255.  A1c was 5.7 likely stress related hyperglycemia.  On low-dose sliding scale for now.  CBG (last 3)  Recent Labs    02/01/19 0027  02/01/19 0402 02/01/19 0800  GLUCAP 154* 79 132*    Dementia with Toxic Encephalopathy -   Pleasantly confused.  Occasional agitation noted.  Improved when his wife is at bedside in the daytime nighttime he does require sitter off and on.    Goals  of care - Seen by palliative medicine.  Full scope of treatment to continue.   DVT prophylaxis: SCD, add Heparin Code Status: Full Family Communication: Discussed with patient's daughter yesterday and with his wife today.   Disposition Plan: Unclear for now.  Possibly may need to go to skilled nursing facility when medically ready for discharge.  May end up undergoing cholecystectomy early next week.  Will stay here in the hospital through the weekend.   Consultants:  GI CCS ID Cards  Procedures/Significant Events:  4/12 PCXR: LEFT basilar atelectasis/early infiltrate 4/12 US abdomen RUQ:-Mild gallbladder wall thickening, probably chronic sequelae of cholecystitis and cholecystostomy tube.  -Cholelithiasis again noted -No intra-/extrahepatic biliary ductal dilation noted  4/13 cholecystostomy tube exchange with fluoroscopy:-Innumerable gallstones. Unable to opacify the common bile duct due to obstruction of the cystic duct. -Purulent looking fluid removed from the gallbladder. Fluid sent for culture. 4/16: Drain check and placement of new suture 4/17 ERCP: - Choledocholithiasis, treated with biliary sphincterotomy and balloon sweeping. Cystic duct did not fill with contrast, suggesting continued obstruction.   LINES / TUBES:  Cholecystostomy tube revision 4/13>>  Cultures 4/12 blood RIGHT antecubital NGTD 4/12 blood counts right hand NGTD  4/13 gallbladder positive Enterobacter    Antimicrobials: Anti-infectives (From admission, onward)   Start     Stop   01/25/19 2200  vancomycin (VANCOCIN) 1,500 mg in sodium chloride 0.9 % 500 mL IVPB  Status:  Discontinued     01/26/19 1124   01/25/19 0200  ceFEPIme (MAXIPIME) 2 g in sodium chloride 0.9 % 100 mL IVPB         01/24/19 2200  ceFEPIme (MAXIPIME) 2 g in sodium chloride 0.9 % 100 mL IVPB  Status:  Discontinued     01/24/19 2208   01/24/19 2200  metroNIDAZOLE (FLAGYL) IVPB 500 mg         01/24/19 1945   piperacillin-tazobactam (ZOSYN) IVPB 3.375 g     01/24/19 2017   01/24/19 1930  ceFEPIme (MAXIPIME) 2 g in sodium chloride 0.9 % 100 mL IVPB  Status:  Discontinued     01/24/19 2010   01/24/19 1930  metroNIDAZOLE (FLAGYL) IVPB 500 mg  Status:  Discontinued     01/24/19 1931   01/24/19 1930  vancomycin (VANCOCIN) IVPB 1000 mg/200 mL premix  Status:  Discontinued     01/24/19 1922   01/24/19 1930  vancomycin (VANCOCIN) 2,000 mg in sodium chloride 0.9 % 500 mL IVPB     01/25/19 0012       Continuous Infusions: . sodium chloride 250 mL (01/30/19 1736)  . ceFEPime (MAXIPIME) IV 2 g (02/01/19 0520)  . dextrose 5 % with kcl 75 mL/hr at 02/01/19 0519  . metronidazole 500 mg (02/01/19 0137)     Objective: Vitals:   01/31/19 1716 01/31/19 2018 02/01/19 0051 02/01/19 0406  BP: 102/73 109/71 104/75 111/75  Pulse: 93 96 95 92  Resp:  20 20 (!) 22  Temp:  98 F (36.7 C) 98.1 F (36.7 C) 98 F (36.7 C)  TempSrc:  Axillary Axillary Axillary  SpO2:  98% 98% 97%  Weight:      Height:        Intake/Output Summary (Last  24 hours) at 02/01/2019 0830 Last data filed at 02/01/2019 0654 Gross per 24 hour  Intake 2248 ml  Output 2375 ml  Net -127 ml   Filed Weights   01/29/19 0805 01/30/19 0607 01/31/19 0433  Weight: 88.4 kg 91.6 kg 92 kg   Exam  Confused, No new F.N deficits, Normal affect [place].AT,PERRAL Supple Neck,No JVD, No cervical lymphadenopathy appriciated.  Symmetrical Chest wall movement, Good air movement bilaterally, CTAB RRR,No Gallops, Rubs or new Murmurs, No Parasternal Heave +ve B.Sounds, Abd Soft, R. GB drain in place, No tenderness, No organomegaly appriciated, No rebound - guarding or rigidity. No Cyanosis, Clubbing or edema, No new Rash or bruise   CBC: Recent Labs  Lab 01/28/19 0347 01/29/19 0340 01/30/19 0346 01/31/19 0240 02/01/19 0404  WBC 5.7 6.7 6.9 6.9 6.8  HGB 11.6* 11.9* 11.1* 11.0* 12.2*  HCT 35.4* 35.3* 33.5* 33.4* 37.5*  MCV 91.5 91.9 93.1  93.8 90.8  PLT 131* 127* 118* 122* 119*   Basic Metabolic Panel: Recent Labs  Lab 01/28/19 0347 01/29/19 0340 01/30/19 0346 01/31/19 0240 02/01/19 0404  NA 134* 136 139 138 132*  K 3.3* 3.8 3.9 3.4* 3.8  CL 105 105 109 108 102  CO2 20* 26 22 19* 22  GLUCOSE 118* 131* 111* 122* 77  BUN 13 12 15 15 14   CREATININE 0.89 0.95 0.92 0.98 0.97  CALCIUM 8.1* 8.4* 8.4* 8.1* 8.2*  MG 2.0 2.0 2.0 2.0 2.0   GFR: Estimated Creatinine Clearance: 58.9 mL/min (by C-G formula based on SCr of 0.97 mg/dL). Liver Function Tests: Recent Labs  Lab 01/28/19 0347 01/29/19 0340 01/30/19 0346 01/31/19 0240 02/01/19 0404  AST 33 27 22 19 18   ALT 83* 62* 44 34 27  ALKPHOS 365* 308* 258* 222* 209*  BILITOT 1.4* 1.5* 1.3* 1.2 1.2  PROT 6.1* 6.2* 6.3* 6.0* 6.7  ALBUMIN 2.3* 2.3* 2.3* 2.3* 2.4*   No results for input(s): LIPASE, AMYLASE in the last 168 hours. No results for input(s): AMMONIA in the last 168 hours. Coagulation Profile: Recent Labs  Lab 02/01/19 0404  INR 1.2   Cardiac Enzymes: No results for input(s): CKTOTAL, CKMB, CKMBINDEX, TROPONINI in the last 168 hours. HbA1C: No results for input(s): HGBA1C in the last 72 hours. CBG: Recent Labs  Lab 01/31/19 1650 01/31/19 2145 02/01/19 0027 02/01/19 0402 02/01/19 0800  GLUCAP 173* 173* 154* 79 132*   Lipid Profile: No results for input(s): CHOL, HDL, LDLCALC, TRIG, CHOLHDL, LDLDIRECT in the last 72 hours. Urine analysis:    Component Value Date/Time   COLORURINE AMBER (A) 01/24/2019 2138   APPEARANCEUR HAZY (A) 01/24/2019 2138   LABSPEC 1.018 01/24/2019 2138   PHURINE 5.0 01/24/2019 2138   GLUCOSEU 50 (A) 01/24/2019 2138   HGBUR SMALL (A) 01/24/2019 2138   BILIRUBINUR SMALL (A) 01/24/2019 2138   KETONESUR NEGATIVE 01/24/2019 2138   PROTEINUR NEGATIVE 01/24/2019 2138   NITRITE NEGATIVE 01/24/2019 2138   LEUKOCYTESUR LARGE (A) 01/24/2019 2138    Recent Results (from the past 240 hour(s))  Blood Culture (routine x 2)      Status: None   Collection Time: 01/24/19  7:15 PM  Result Value Ref Range Status   Specimen Description BLOOD RIGHT ANTECUBITAL  Final   Special Requests   Final    BOTTLES DRAWN AEROBIC AND ANAEROBIC Blood Culture results may not be optimal due to an inadequate volume of blood received in culture bottles   Culture   Final    NO GROWTH 5  DAYS Performed at [facility], [place] [address], [place], [name] [address]    Report Status 01/29/2019 FINAL  Final  Blood Culture (routine x 2)     Status: None   Collection Time: 01/24/19  7:36 PM  Result Value Ref Range Status   Specimen Description BLOOD RIGHT HAND  Final   Special Requests   Final    BOTTLES DRAWN AEROBIC AND ANAEROBIC Blood Culture results may not be optimal due to an inadequate volume of blood received in culture bottles   Culture   Final    NO GROWTH 5 DAYS Performed at [facility], [place] [address] [place], [place] [address]    Report Status 01/29/2019 FINAL  Final  Aerobic/Anaerobic Culture (surgical/deep wound)     Status: None (Preliminary result)   Collection Time: 01/25/19  2:57 PM  Result Value Ref Range Status   Specimen Description GALL BLADDER  Final   Special Requests NONE  Final   Gram Stain   Final    ABUNDANT WBC PRESENT, PREDOMINANTLY PMN MODERATE GRAM NEGATIVE RODS RARE GRAM POSITIVE COCCI Performed at [facility], [place] [address] [place], [place] [address]    Culture   Final    ABUNDANT ENTEROBACTER HORMAECHEI RARE ENTEROCOCCUS FAECALIS SUSCEPTIBILITIES TO FOLLOW NO ANAEROBES ISOLATED; CULTURE IN PROGRESS FOR 5 DAYS    Report Status PENDING  Incomplete   Organism ID, Bacteria ENTEROBACTER HORMAECHEI  Final      Susceptibility   Enterobacter hormaechei - MIC*    CEFAZOLIN >=64 RESISTANT Resistant     CEFEPIME <=1 SENSITIVE Sensitive     CEFTAZIDIME >=64 RESISTANT Resistant     CEFTRIAXONE >=64 RESISTANT Resistant     CIPROFLOXACIN 0.5 SENSITIVE Sensitive     GENTAMICIN <=1  SENSITIVE Sensitive     IMIPENEM 0.5 SENSITIVE Sensitive     TRIMETH/SULFA <=20 SENSITIVE Sensitive     PIP/TAZO >=128 RESISTANT Resistant     * ABUNDANT ENTEROBACTER HORMAECHEI     Radiology Studies: No results found.  Scheduled Meds: . amiodarone  100 mg Oral Daily  . carvedilol  3.125 mg Oral BID WC  . insulin aspart  0-9 Units Subcutaneous Q4H  . pantoprazole (PROTONIX) IV  40 mg Intravenous Q24H  . sodium chloride flush  3 mL Intravenous Q12H  . sodium chloride flush  3 mL Intravenous Q12H  . sodium chloride flush  5 mL Intracatheter Q8H   Continuous Infusions: . sodium chloride 250 mL (01/30/19 1736)  . ceFEPime (MAXIPIME) IV 2 g (02/01/19 0520)  . dextrose 5 % with kcl 75 mL/hr at 02/01/19 0519  . metronidazole 500 mg (02/01/19 0137)     LOS: 8 days   Signature  Lala Lund M.D on 02/01/2019 at 8:30 AM   -  To page go to [email or web address]

## 2019-02-01 NOTE — Progress Notes (Signed)
Progress Note  Patient Name: William [name] Date of Encounter: 02/01/2019  Primary Cardiologist: Sanda Klein, MD   Subjective   Opens eyes; does not respond to questions  Inpatient Medications    Scheduled Meds: . amiodarone  100 mg Oral Daily  . carvedilol  3.125 mg Oral BID WC  . heparin injection (subcutaneous)  5,000 Units Subcutaneous Q8H  . insulin aspart  0-9 Units Subcutaneous Q4H  . pantoprazole (PROTONIX) IV  40 mg Intravenous Q24H  . sodium chloride flush  3 mL Intravenous Q12H  . sodium chloride flush  3 mL Intravenous Q12H  . sodium chloride flush  5 mL Intracatheter Q8H   Continuous Infusions: . sodium chloride 250 mL (01/30/19 1736)  . ceFEPime (MAXIPIME) IV 2 g (02/01/19 0520)  . dextrose 5 % with kcl 50 mL/hr at 02/01/19 0845  . metronidazole 500 mg (02/01/19 0137)   PRN Meds: sodium chloride, acetaminophen **OR** acetaminophen, iohexol, iohexol, lidocaine (PF), ondansetron **OR** ondansetron (ZOFRAN) IV, sodium chloride flush, sodium chloride flush   Vital Signs    Vitals:   02/01/19 0051 02/01/19 0406 02/01/19 0515 02/01/19 0802  BP: 104/75 111/75 112/77 107/77  Pulse: 95 92  92  Resp: 20 (!) 22  20  Temp: 98.1 F (36.7 C) 98 F (36.7 C)  98.7 F (37.1 C)  TempSrc: Axillary Axillary  Axillary  SpO2: 98% 97%  98%  Weight:      Height:        Intake/Output Summary (Last 24 hours) at 02/01/2019 0947 Last data filed at 02/01/2019 0654 Gross per 24 hour  Intake 2248 ml  Output 2375 ml  Net -127 ml   Last 3 Weights 01/31/2019 01/30/2019 01/29/2019  Weight (lbs) 202 lb 13.2 oz 201 lb 15.1 oz 194 lb 14.2 oz  Weight (kg) 92 kg 91.6 kg 88.4 kg      Telemetry    Sinus with PVCs- Personally Reviewed  Physical Exam   GEN: No acute distress.   Neck: No JVD Cardiac: RRR Respiratory: Clear to auscultation bilaterally. GI: Soft, nontender, non-distended  MS: No edema Neuro:  Nonfocal  Psych: Normal affect   Labs    Chemistry Recent  Labs  Lab 01/30/19 0346 01/31/19 0240 02/01/19 0404  NA 139 138 132*  K 3.9 3.4* 3.8  CL 109 108 102  CO2 22 19* 22  GLUCOSE 111* 122* 77  BUN 15 15 14   CREATININE 0.92 0.98 0.97  CALCIUM 8.4* 8.1* 8.2*  PROT 6.3* 6.0* 6.7  ALBUMIN 2.3* 2.3* 2.4*  AST 22 19 18   ALT 44 34 27  ALKPHOS 258* 222* 209*  BILITOT 1.3* 1.2 1.2  GFRNONAA >60 >60 >60  GFRAA >60 >60 >60  ANIONGAP 8 11 8      Hematology Recent Labs  Lab 01/30/19 0346 01/31/19 0240 02/01/19 0404  WBC 6.9 6.9 6.8  RBC 3.60* 3.56* 4.13*  HGB 11.1* 11.0* 12.2*  HCT 33.5* 33.4* 37.5*  MCV 93.1 93.8 90.8  MCH 30.8 30.9 29.5  MCHC 33.1 32.9 32.5  RDW 15.7* 15.8* 15.5  PLT 118* 122* 128*    Patient Profile     85 y.o. male with history of severe aortic stenosis s/p TAVR in 06/18/7095, chronic systolic CHF/non-ischemic cardiomyopathy with history of ventricular tachycardia s/p AICD which has been removed due to infection (admission 11/12 with ESBL bacteremia nad Klebsiella lead vegetation), moderate CAD by cath 06/2018, 4.2cm x 5.2cm infrarenal AAA (sized by imaging this admission), dementia, obstructive sleep apnea, hyperlipidemia,  severe deconditioning, degenerative arthritis/lumbar vertebral fracture with reduced mobility,cholecystitis and cholangitis with biliary drain in place. He was admitted 11/2018 with severe sepsis secondary to cholecystitis and Enterobacter bacteremia and acute respiratory failure requiring intubation and had perc drain placed. He was readmitted 01/24/19 with AMS and found to have severe biliary sepsis. He was initially felt to be a poor candidate for surgery but ID felt that without intervention he is at risk for recurrent infection and sepsis. Drain was found to be partially obstructed. ERCP scheduled for 4/17.  Assessment & Plan    1 nonischemic cardiomyopathy-we will continue present dose of carvedilol.  He is not volume overloaded on examination.  2 status post TAVR-Plavix discontinued.  3  ventricular tachycardia-PVCs noted on telemetry but no VT.  Continue amiodarone at present dose.  4 recurrent sepsis/biliary disease-management per gastroenterology and general surgery.  Patient will be high risk for any procedure.  [facility] will sign off.   Medication Recommendations: Continue present medications at discharge. Follow up as an outpatient: 6 to 8 weeks following discharge with Dr. Sallyanne Kuster  For questions or updates, please contact [place] Please consult [email or web address] for contact info under        Signed, Kirk Ruths, MD  02/01/2019, 9:47 AM

## 2019-02-02 ENCOUNTER — Inpatient Hospital Stay (HOSPITAL_COMMUNITY): Payer: Medicare Other

## 2019-02-02 ENCOUNTER — Telehealth: Payer: Self-pay | Admitting: Cardiovascular Disease

## 2019-02-02 DIAGNOSIS — G92 Toxic encephalopathy: Secondary | ICD-10-CM

## 2019-02-02 LAB — COMPREHENSIVE METABOLIC PANEL
ALT: 24 U/L (ref 0–44)
AST: 19 U/L (ref 15–41)
Albumin: 2.4 g/dL — ABNORMAL LOW (ref 3.5–5.0)
Alkaline Phosphatase: 190 U/L — ABNORMAL HIGH (ref 38–126)
Anion gap: 11 (ref 5–15)
BUN: 12 mg/dL (ref 8–23)
CO2: 20 mmol/L — ABNORMAL LOW (ref 22–32)
Calcium: 8.5 mg/dL — ABNORMAL LOW (ref 8.9–10.3)
Chloride: 100 mmol/L (ref 98–111)
Creatinine, Ser: 1.01 mg/dL (ref 0.61–1.24)
GFR calc Af Amer: >60 mL/min (ref >60)
GFR calc non Af Amer: >60 mL/min (ref >60)
Glucose, Bld: 152 mg/dL — ABNORMAL HIGH (ref 70–99)
Potassium: 3.7 mmol/L (ref 3.5–5.1)
Sodium: 131 mmol/L — ABNORMAL LOW (ref 135–145)
Total Bilirubin: 1.2 mg/dL (ref 0.3–1.2)
Total Protein: 6.7 g/dL (ref 6.5–8.1)

## 2019-02-02 LAB — GLUCOSE, CAPILLARY
Glucose-Capillary: 132 mg/dL — ABNORMAL HIGH (ref 70–99)
Glucose-Capillary: 139 mg/dL — ABNORMAL HIGH (ref 70–99)
Glucose-Capillary: 142 mg/dL — ABNORMAL HIGH (ref 70–99)
Glucose-Capillary: 148 mg/dL — ABNORMAL HIGH (ref 70–99)
Glucose-Capillary: 151 mg/dL — ABNORMAL HIGH (ref 70–99)
Glucose-Capillary: 171 mg/dL — ABNORMAL HIGH (ref 70–99)

## 2019-02-02 LAB — CBC
HCT: 36.4 % — ABNORMAL LOW (ref 39.0–52.0)
Hemoglobin: 12.6 g/dL — ABNORMAL LOW (ref 13.0–17.0)
MCH: 31 pg (ref 26.0–34.0)
MCHC: 34.6 g/dL (ref 30.0–36.0)
MCV: 89.4 fL (ref 80.0–100.0)
Platelets: 142 10*3/uL — ABNORMAL LOW (ref 150–400)
RBC: 4.07 MIL/uL — ABNORMAL LOW (ref 4.22–5.81)
RDW: 15.7 % — ABNORMAL HIGH (ref 11.5–15.5)
WBC: 7.3 10*3/uL (ref 4.0–10.5)
nRBC: 0 % (ref 0.0–0.2)

## 2019-02-02 MED ORDER — ORAL CARE MOUTH RINSE
15.0000 mL | Freq: Two times a day (BID) | OROMUCOSAL | Status: DC
  Administered 2019-02-02 – 2019-02-08 (×12): 15 mL via OROMUCOSAL

## 2019-02-02 MED ORDER — PANTOPRAZOLE SODIUM 40 MG PO TBEC
40.0000 mg | DELAYED_RELEASE_TABLET | Freq: Every day | ORAL | Status: DC
  Administered 2019-02-04 – 2019-02-07 (×4): 40 mg via ORAL
  Filled 2019-02-02 (×6): qty 1

## 2019-02-02 NOTE — Telephone Encounter (Signed)
New Message:   Patient wife calling concering husband he is in the hospital. Please call patient wife.

## 2019-02-02 NOTE — Progress Notes (Signed)
EEG Complete  Results Pending 

## 2019-02-02 NOTE — Consult Note (Signed)
Neurology Consultation  Reason for Consult: AMS, cognitive decline and decreased level of consciousness Referring Physician: Dr Sloan Leiter  CC: AMS  History is obtained from: Chart, wife at bedside and daughter Ebony Hail over cellphone  HPI: William [name] is a 85 y.o. male with PMH of "mild" dementia, aortic stenosis status post TAVR, pulmonary nodules, chronic systolic heart failure with a history of VT who had a AICD placed that was removed due to infection, cholecystitis and cholangitis with biliary drain in place, debility-resident of SNF prior to this admission, presented to the emergency department on 01/24/2019 with lethargy, fevers and vomiting.  Patient had been unable to provide any meaningful history at that time going by the H&P.  He had been having decreased biliary drainage and nausea vomiting and lethargy days prior to presentation. General surgery and GI involved in his care.  He underwent ERCP on 01/29/2019 and according to the family has been gradually more somnolent since and today has been very difficult to arouse although he is maintaining his airway and saturations and has stable vitals. I spoke with his daughter, over the phone,-his wife and connected the phone and put me on speaker phone to speak with the daughter. The sense that I got from my communications with the family is that he had been gradually worsening in terms of his mentation but remained conversant but at times requiring sitters and becoming disoriented even at baseline. He has had multiple hospitalization due to his biliary issues and with each hospitalization has become more frail and also probably less cognitively sharp. Neurological consultation was obtained for this ongoing altered mental status and family's request to have a neurologist evaluate the patient for any other things that can be done for improving his mentation. He has been evaluated by cardiology and general medicine and deemed to be a very high  risk candidate for any procedure.  General surgery has evaluated the patient and is not inclined towards performing any procedures given his general medical condition.   ROS:  Unable to obtain due to altered mental status.   Past Medical History:  Diagnosis Date  . AAA (abdominal aortic aneurysm) ([place])    7/13 3.8cm  . Arthritis    "joints" (01/11/2014)  . Asthma    "seasonal; some foods"   . Chronic systolic CHF (congestive heart failure) ([place])   . Dementia ([place])   . HLD (hyperlipidemia)   . HOH (hard of hearing)   . Lumbar vertebral fracture ([facility])    L1- 04/11/2014   . Prostate cancer ([place])   . S/P ICD (internal cardiac defibrillator) procedure, 01/11/14 removal of ERI gen and placement of Medtronic Evera XT VR & NEW Right ventricular lead Medtronic 01/12/2014  . S/P TAVR (transcatheter aortic valve replacement)    Edwards Sapien 3 THV (size 29 mm, model # [id number], serial # [id number])  . Severe aortic stenosis   . Sleep apnea    "lost 60# & don't have it anymore" (01/11/2014)  . Ventricular tachycardia ([facility])     Family History  Problem Relation Age of Onset  . Heart failure Mother        Died of "old age" at 38  . Pneumonia Father    Social History:   reports that he has never smoked. He has never used smokeless tobacco. He reports current alcohol use. He reports that he does not use drugs.  Medications  Current Facility-Administered Medications:  .  0.9 %  sodium chloride infusion, 250 mL, Intravenous, PRN, Ardis Hughs,  Melene Plan, MD, Last Rate: 10 mL/hr at 01/30/19 1736, 250 mL at 01/30/19 1736 .  acetaminophen (TYLENOL) tablet 650 mg, 650 mg, Oral, Q6H PRN, 650 mg at 01/28/19 1828 **OR** acetaminophen (TYLENOL) suppository 650 mg, 650 mg, Rectal, Q6H PRN, Milus Banister, MD .  amiodarone (PACERONE) tablet 100 mg, 100 mg, Oral, Daily, Milus Banister, MD, 100 mg at 02/02/19 9563 .  carvedilol (COREG) tablet 3.125 mg, 3.125 mg, Oral, BID WC, Josue Hector, MD, 3.125 mg at  02/02/19 0905 .  dextrose 5 % 1,000 mL with potassium chloride 40 mEq infusion, , Intravenous, Continuous, Thurnell Lose, MD, Last Rate: 50 mL/hr at 02/02/19 0700 .  heparin injection 5,000 Units, 5,000 Units, Subcutaneous, Q8H, Thurnell Lose, MD, 5,000 Units at 02/02/19 1617 .  insulin aspart (novoLOG) injection 0-9 Units, 0-9 Units, Subcutaneous, Q4H, Milus Banister, MD, 1 Units at 02/02/19 1300 .  iohexol (OMNIPAQUE) 300 MG/ML solution 10 mL, 10 mL, Intravenous, Once PRN, Milus Banister, MD .  iohexol (OMNIPAQUE) 300 MG/ML solution 10 mL, 10 mL, Intravenous, Once PRN, Milus Banister, MD .  lidocaine (PF) (XYLOCAINE) 1 % injection, , , PRN, Markus Daft, MD, 5 mL at 01/25/19 1450 .  MEDLINE mouth rinse, 15 mL, Mouth Rinse, BID, Thurnell Lose, MD, 15 mL at 02/02/19 0910 .  meropenem (MERREM) 1 g in sodium chloride 0.9 % 100 mL IVPB, 1 g, Intravenous, Q8H, Powers, Evern Core, MD, Last Rate: 200 mL/hr at 02/02/19 1614, 1 g at 02/02/19 1614 .  ondansetron (ZOFRAN) tablet 4 mg, 4 mg, Oral, Q6H PRN **OR** ondansetron (ZOFRAN) injection 4 mg, 4 mg, Intravenous, Q6H PRN, Milus Banister, MD, 4 mg at 01/29/19 0945 .  pantoprazole (PROTONIX) EC tablet 40 mg, 40 mg, Oral, QHS, Ghimire, Shanker M, MD .  sodium chloride flush (NS) 0.9 % injection 10-40 mL, 10-40 mL, Intracatheter, PRN, Milus Banister, MD, 10 mL at 02/02/19 0823 .  sodium chloride flush (NS) 0.9 % injection 3 mL, 3 mL, Intravenous, Q12H, Milus Banister, MD, 3 mL at 02/01/19 2157 .  sodium chloride flush (NS) 0.9 % injection 3 mL, 3 mL, Intravenous, Q12H, Milus Banister, MD, 3 mL at 01/31/19 2313 .  sodium chloride flush (NS) 0.9 % injection 3 mL, 3 mL, Intravenous, PRN, Milus Banister, MD, 3 mL at 01/25/19 0013 .  sodium chloride flush (NS) 0.9 % injection 5 mL, 5 mL, Intracatheter, Q8H, Milus Banister, MD, 5 mL at 02/02/19 1619  Exam: Current vital signs: BP 116/81 (BP Location: Left Arm)   Pulse 98   Temp 97.6 F  (36.4 C) (Oral)   Resp 20   Ht 5\' 10"  (1.778 m)   Wt 89.8 kg   SpO2 98%   BMI 28.41 kg/m  Vital signs in last 24 hours: Temp:  [97.6 F (36.4 C)-98.4 F (36.9 C)] 97.6 F (36.4 C) (04/21 0814) Pulse Rate:  [98-102] 98 (04/21 1627) Resp:  [13-24] 20 (04/21 1627) BP: (100-116)/(68-82) 116/81 (04/21 1624) SpO2:  [95 %-100 %] 98 % (04/21 1627) Weight:  [89.8 kg] 89.8 kg (04/21 0500) General: An elderly gentleman laying comfortably in bed in no apparent distress HEENT: Normocephalic atraumatic dry oral mucous membranes CVS: Mildly tachycardic with heart rate in the 100s, regular rate rhythm Respiratory: Saturating normally on room air, equal air entry bilaterally Abdomen: Nondistended nontender GENERAL: Awake, alert in NAD HEENT: - Normocephalic and atraumatic, dry mm, no LN++, no Thyromegally LUNGS -  Clear to auscultation bilaterally with no wheezes CV - S1S2 RRR, no m/r/g, equal pulses bilaterally. ABDOMEN - Soft, nontender, nondistended with normoactive BS Ext: warm, well perfused, intact peripheral pulses, __ edema Neurological exam Patient is laying in bed with eyes closed. He does not open eyes to voice. He resists eye opening He is nonverbal Cranial: Pupils equal round reactive light, extraocular movements are difficult to assess but he does not have a gaze preference or forced gaze, face appears symmetric. Motor exam: He withdraws all 4 to noxious stimulation but does not spontaneously move any extremities.  Of note his left arm on passive motion generates a lot of discomfort and he grimaces and moans in pain. Sensory exam: As above  Labs I have reviewed labs in epic and the results pertinent to this consultation are:  CBC    Component Value Date/Time   WBC 7.3 02/02/2019 0503   RBC 4.07 (L) 02/02/2019 0503   HGB 12.6 (L) 02/02/2019 0503   HCT 36.4 (L) 02/02/2019 0503   PLT 142 (L) 02/02/2019 0503   MCV 89.4 02/02/2019 0503   MCH 31.0 02/02/2019 0503   MCHC 34.6  02/02/2019 0503   RDW 15.7 (H) 02/02/2019 0503   LYMPHSABS 0.6 (L) 01/25/2019 0305   MONOABS 0.9 01/25/2019 0305   EOSABS 0.0 01/25/2019 0305   BASOSABS 0.0 01/25/2019 0305    CMP     Component Value Date/Time   NA 131 (L) 02/02/2019 0503   NA 132 (A) 01/10/2019   K 3.7 02/02/2019 0503   CL 100 02/02/2019 0503   CO2 20 (L) 02/02/2019 0503   GLUCOSE 152 (H) 02/02/2019 0503   BUN 12 02/02/2019 0503   BUN 26 (A) 01/10/2019   CREATININE 1.01 02/02/2019 0503   CREATININE 1.12 (H) 02/05/2017 1103   CALCIUM 8.5 (L) 02/02/2019 0503   PROT 6.7 02/02/2019 0503   ALBUMIN 2.4 (L) 02/02/2019 0503   AST 19 02/02/2019 0503   ALT 24 02/02/2019 0503   ALKPHOS 190 (H) 02/02/2019 0503   BILITOT 1.2 02/02/2019 0503   GFRNONAA >60 02/02/2019 0503   GFRAA >60 02/02/2019 0503   Imaging I have reviewed the images obtained which I recommended that the primary team order when they requested the consult:  CT-scan of the brain-no acute changes, 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 MRI examination of the brain-confirmation of the above findings with no acute stroke or bleed.  Assessment: 85 year old man who has a history of what the family said is mild dementia at baseline, arctic stenosis status post TAVR, pulmonary nodules, chronic systolic heart failure with a history of VT and had a AICD in place which was removed due to infection, cholecystitis and cholangitis with biliary drain in place with multiple complications with that, debility-resident of a SNF prior to this admission, presented to the emergency room on 01/24/2019 with best if he was vomiting-taken in on 01/29/2019 for ERCP, and since has been more lethargic than usual. According to the family, he has been gradually exhibiting symptoms of cognitive decline more so over few months but the changes to his level of consciousness were rather acute  after the procedure and he has not been like himself. I suspect that he had baseline has some dementia and these acute changes are leading to an acute worsening of his underlying cognitive deficits making him the way he appears right now. He has extensive medical issues at this  time and has had multiple hospitalizations over the past few months to years- this does not bode well with somebody with cognitive deficits and renders them more prone to hospital induced delirium and gradual decline in the cognitive abilities.  I had a detailed conversation with the daughter, who asked me very good questions that I attempted to answer to the best of my ability.  I was unable to answer questions or to commit to times when she would expect her to get better or like he was even a few weeks ago as it is very difficult to make those predictions.  I did though tell her that I will look for causes of encephalopathy/delirium that are reversible-and to that end I have ordered a CT which was unremarkable for acute process an MRI of the brain again which was unremarkable for acute process but showed extensive atrophy and white matter disease.  Impression: Multifactorial toxic metabolic encephalopathy Acute decompensation in mentation in the setting of cognitive decline  Recommendations: -CT and MRI of the brain have been done -EEG has been ordered- will be completed today and results will be available by tomorrow-no suspicion for any acute issues that but would like to complete the work-up. -Check B12, TSH-might be easy later possible causes of altered mental status  -His daughter asked me specific questions about what all can be done to stimulate him-specifically music, physical therapy, contact-like feet rubbing etc.-I told her that anything that does not bother her or give her pain should be okay to do.  -The family has requested if they can all speak with him at the same time the via teleconferencing-I will defer  that to the primary team and the nursing teams to figure if that might be possible to get a large screen to have both his daughters on teleconferencing at the same time so that he can hear the voices as requested by family.  -I have spoken with his daughter Ms. Ebony Hail over the phone and made it very clear that he appears very sick and I am in no way able to predict if he would ever return to his baseline and if he does, what timeframe that would entail- but that I will work on finding any reversible cause which might be treated to improve his mentation.  -I will also recommend to keep having conversations with the palliative care team so that they can put things in perspective and help the family make decisions towards the treatment going forward and provide the support they can do ensure that the family receives all the information that they have been meeting to and it is interpreting for them so that they have a good understanding of that information.  I will continue to follow the patient with you.  -- Amie Portland, MD [facility] Pager: [phone] If 7pm to 7am, please call on call as listed on AMION.

## 2019-02-02 NOTE — Progress Notes (Addendum)
4 Days Post-Op    CC: Cholangitis  Subjective: Patient is keeping his eyes closed, he is not responding to his wife at all.  Last night he ate supper with his eyes closed.  Refused CPAP last evening.  This a.m. he is not responding at all and keeps his eyes closed.  He is not eating anything this a.m.  Abdominal exam is normal, he does not appear to be in any pain.  IR drain/cholecystostomy tube is working well.   Objective: Vital signs in last 24 hours: Temp:  [97.6 F (36.4 C)-98.4 F (36.9 C)] 97.6 F (36.4 C) (04/21 0814) Pulse Rate:  [96-102] 102 (04/21 0715) Resp:  [13-24] 20 (04/21 0814) BP: (100-115)/(67-84) 115/75 (04/21 0814) SpO2:  [95 %-100 %] 96 % (04/21 0814) Weight:  [89.8 kg] 89.8 kg (04/21 0500) Last BM Date: 02/02/19 120 PO recorded 1200 IV 2039 urine Drain 62 cc NO BM  Recorded Afebrile, mild tachycardia ~100 Labs stable   Intake/Output from previous day: 04/20 0701 - 04/21 0700 In: 1523.1 [P.O.:120; I.V.:1114.9; IV Piggyback:283.2] Out: 2101 [Urine:2039; Drains:62] Intake/Output this shift: Total I/O In: 10 [I.V.:10] Out: -   General appearance: Patient skipped his eyes closed.  Is not responding to any verbal stimulation from his wife or from myself. GI: Soft, non-tender positive bowel sounds.  Drain is intact appears to be working well.  Bowel is clear to green in color.  Lab Results:  Recent Labs    02/01/19 0404 02/02/19 0503  WBC 6.8 7.3  HGB 12.2* 12.6*  HCT 37.5* 36.4*  PLT 128* 142*    BMET Recent Labs    02/01/19 0404 02/02/19 0503  NA 132* 131*  K 3.8 3.7  CL 102 100  CO2 22 20*  GLUCOSE 77 152*  BUN 14 12  CREATININE 0.97 1.01  CALCIUM 8.2* 8.5*   PT/INR Recent Labs    02/01/19 0404  LABPROT 15.0  INR 1.2    Recent Labs  Lab 01/29/19 0340 01/30/19 0346 01/31/19 0240 02/01/19 0404 02/02/19 0503  AST '27 22 19 18 19  ' ALT 62* 44 34 27 24  ALKPHOS 308* 258* 222* 209* 190*  BILITOT 1.5* 1.3* 1.2 1.2 1.2   PROT 6.2* 6.3* 6.0* 6.7 6.7  ALBUMIN 2.3* 2.3* 2.3* 2.4* 2.4*     Lipase     Component Value Date/Time   LIPASE 51 01/24/2019 1935     Medications: . amiodarone  100 mg Oral Daily  . carvedilol  3.125 mg Oral BID WC  . heparin injection (subcutaneous)  5,000 Units Subcutaneous Q8H  . insulin aspart  0-9 Units Subcutaneous Q4H  . mouth rinse  15 mL Mouth Rinse BID  . pantoprazole  40 mg Oral QHS  . sodium chloride flush  3 mL Intravenous Q12H  . sodium chloride flush  3 mL Intravenous Q12H  . sodium chloride flush  5 mL Intracatheter Q8H    Assessment/Plan Dementia AAA Aortic stenosis status post TAVR Pulmonary nodules Chronic systolic CHF Hstory of VT withAICD that was removed due to infection Cholecystitis and cholangitis with biliary drain in place02/2020 Hx of Multiple abdominal surgeries  Severe sepsis with Enterobacter bacteremia Choledocholithiasis  -Cholecystostomytube  11/22/18; IR exchange 12/18/18, and 01/05/19, confirmed placement/tube exchange by IR 01/25/19 - s/p ERCP 01/29/19, Dr. Ardis Hughs, with findings of choledocholithiasis, treated with biliarysphincterotomy and balloon sweeping. Cystic duct did not fill with contrast - Cardiology places patient at moderate to high risk - AST normal. ALT is normal; alk phos  downtrending, almost normal. - No indication for emergent or urgent surgery at this time. - Dr. Redmond Pulling had a long discussion with patients daughter 01/28/19. Please see his note for further details.   FEN -Soft diet, IV fluids VTE -SCD, Heparin ID -Cefepime/Flagyl 4/12 >> day 10   Plan: Spent some time with his wife.  She is keenly aware of the continued cognitive decline.  Family was convinced that once we got his gallbladder out he would be better, now she does not know what to do next.  He stable from a surgical standpoint.  We will continue to follow.    LOS: 9 days    Serafina Topham 02/02/2019 [phone]

## 2019-02-02 NOTE — Telephone Encounter (Signed)
Called and spoke with wife, she is wanting a consult for the hospital for neurology. I advised patient that since patient is still currently in the hospital- the staff and doctors there would have to set up for that consult if they feel it is needed. Wife states she will speak with the nurse there, as she missed the doctor in the hospital this morning.  Patient thankful for the call.

## 2019-02-02 NOTE — Progress Notes (Addendum)
PROGRESS NOTE        PATIENT DETAILS Name: William [name] Age: 85 y.o. 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  Patient underwent cholecystostomy tube and revision by IR, and also underwent ERCP.  General surgery evaluation in progress to see if patient is a candidate for high risk cholecystectomy.  See below for further details  Subjective: Confused-lethargic-wakes up with a strong painful stimuli.  Mumbles something occasionally.  Assessment/Plan: Severe sepsis secondary to acute cholecystitis, cholangitis:  Sepsisphysiology has resolved, s/p percutaneous cholecystostomy tube exchange on 4/13-as existing cholecystostomy tube was blocked.  Subsequently underwent ERCP on 4/17. Bile cultures on 4/13+ for enterococcus and Enterobacter-ID following-remains on meropenem.  Very difficult situation-appears very frail-numerous comorbidities makes surgical risk high-but suspect as long as patient has smoldering biliary issues/stones-may remain at risk for recurrent hospitalizations.  Long discussion with spouse at bedside-she is aware of this difficult situation-seems to be resistant to hospice measures at this point, family is in discussion whether to proceed with cholecystectomy if offered by general surgery.  General surgery, palliative  care-cardiology following.  Acute metabolic encephalopathy:Remains confused-lethargic-suspect may have some cognitive dysfunction at baseline-worsened by sepsis/recurrent hospitalization.  Moves all 4 extremities-in response to pain.  Continue supportive care-but will obtain a CT head, EEG-have called neurology at the request of family.  History of ventricular tachycardia had AICD in place but this was explanted in December 2019 for ESBL producing Klebsiella lead endocarditis: Monitored in telemetry-continue Coreg and amiodarone  History of severe aortic stenosis s/p TAVR in November 2019:TTEE on 2/13 with stable bioprosthetic aortic valve in place.    Chronic systolic heart failure (EF 25-30% by TTE on 11/23/2018):Volume status appears relatively stable-follow.  CAD: No anginal symptoms-Plavix on hold  Severe deconditioning/debility: Secondary to acute illness-and numerous recent hospitalizations-we will need PT evaluation.  AAA (4.6 x 4 cm infrarenal): Stable for outpatient follow-up.  Multiple small pulmonary nodules throughout the lung seen on CT chest on 08/03/2018:Stable for outpatient follow-up  DVT Prophylaxis: Prophylactic Heparin   Code Status: Full code  Family Communication: Spouse at bedside  Disposition Plan: Remain inpatient-but will plan on Home health vs SNF on discharge  Antimicrobial agents: Anti-infectives (From admission, onward)   Start     Dose/Rate Route Frequency Ordered Stop   02/01/19 2200  ceFEPIme (MAXIPIME) 2 g in sodium chloride 0.9 % 100 mL IVPB  Status:  Discontinued     2 g 200 mL/hr over 30 Minutes Intravenous Every 12 hours 02/01/19 1044 02/01/19 1151   02/01/19 1300  meropenem (MERREM) 1 g in sodium chloride 0.9 % 100 mL IVPB     1 g 200 mL/hr over 30 Minutes Intravenous Every 8 hours 02/01/19 1151     01/28/19 1400  ceFEPIme (MAXIPIME) 2 g in sodium chloride 0.9 % 100 mL IVPB  Status:  Discontinued     2 g 200 mL/hr over 30  Minutes Intravenous Every 8 hours 01/28/19 0830 02/01/19 1044   01/25/19 2200  vancomycin (VANCOCIN) 1,500 mg in sodium chloride 0.9 % 500 mL IVPB  Status:  Discontinued     1,500 mg 250 mL/hr over 120 Minutes Intravenous Every 24 hours 01/24/19 2027 01/26/19 1124   01/25/19 0200  ceFEPIme (MAXIPIME) 2 g in sodium chloride 0.9 % 100 mL IVPB  Status:  Discontinued     2 g 200 mL/hr over 30 Minutes Intravenous Every 12 hours 01/24/19 2211 01/28/19 0830   01/24/19 2200  ceFEPIme (MAXIPIME) 2 g in sodium chloride 0.9 % 100 mL IVPB  Status:  Discontinued     2 g 200 mL/hr over 30 Minutes Intravenous  Once 01/24/19 2158 01/24/19 2208   01/24/19 2200  metroNIDAZOLE (FLAGYL) IVPB 500 mg  Status:  Discontinued     500 mg 100 mL/hr over 60 Minutes Intravenous Every 8 hours 01/24/19 2158 02/01/19 1151   01/24/19 1945  piperacillin-tazobactam (ZOSYN) IVPB 3.375 g     3.375 g 100 mL/hr over 30 Minutes Intravenous  Once 01/24/19 1931 01/24/19 2017   01/24/19 1930  ceFEPIme (MAXIPIME) 2 g in sodium chloride 0.9 % 100 mL IVPB  Status:  Discontinued     2 g 200 mL/hr over 30 Minutes Intravenous  Once 01/24/19 1918 01/24/19 2010   01/24/19 1930  metroNIDAZOLE (FLAGYL) IVPB 500 mg  Status:  Discontinued     500 mg 100 mL/hr over 60 Minutes Intravenous  Once 01/24/19 1918 01/24/19 1931   01/24/19 1930  vancomycin (VANCOCIN) IVPB 1000 mg/200 mL premix  Status:  Discontinued     1,000 mg 200 mL/hr over 60 Minutes Intravenous  Once 01/24/19 1918 01/24/19 1922   01/24/19 1930  vancomycin (VANCOCIN) 2,000 mg in sodium chloride 0.9 % 500 mL IVPB     2,000 mg 250 mL/hr over 120 Minutes Intravenous  Once 01/24/19 1922 01/25/19 0012      Procedures: 4/13 cholecystostomy tube exchange with fluoroscopy 4/16: Drain check and placement of new suture 4/17 ERCP  CONSULTS:  cardiology, GI, general surgery and Palliative care  Time spent: 25- minutes-Greater than 50% of this time was spent in counseling,  explanation of diagnosis, planning of further management, and coordination of care.  MEDICATIONS: Scheduled Meds:  amiodarone  100 mg Oral Daily   carvedilol  3.125 mg Oral BID WC   heparin injection (subcutaneous)  5,000 Units Subcutaneous Q8H   insulin aspart  0-9 Units Subcutaneous Q4H   mouth rinse  15 mL Mouth Rinse BID   pantoprazole  40 mg Oral QHS   sodium chloride flush  3 mL Intravenous Q12H   sodium chloride flush  3 mL Intravenous Q12H   sodium chloride flush  5 mL Intracatheter Q8H   Continuous Infusions:  sodium chloride 250 mL (01/30/19 1736)   dextrose 5 % with kcl 50 mL/hr at 02/02/19 0700   meropenem (MERREM) IV Stopped (02/02/19 0655)   PRN Meds:.sodium chloride, acetaminophen **OR** acetaminophen, iohexol, iohexol, lidocaine (PF), ondansetron **OR** ondansetron (ZOFRAN) IV, sodium chloride flush, sodium chloride flush   PHYSICAL EXAM: Vital signs: Vitals:   02/02/19 0416 02/02/19 0500 02/02/19 0715 02/02/19 0814  BP:    115/75  Pulse:   (!) 102   Resp:   13 20  Temp: 98.2 F (36.8 C)   97.6 F (36.4 C)  TempSrc: Oral   Oral  SpO2:   100% 96%  Weight:  89.8 kg    Height:  Filed Weights   01/30/19 0607 01/31/19 0433 02/02/19 0500  Weight: 91.6 kg 92 kg 89.8 kg   Body mass index is 28.41 kg/m.   General appearance: Lethargic-briefly opens eyes to painful stimuli. HEENT: Atraumatic and Normocephalic Neck: supple Resp:Good air entry bilaterally, no added sounds heard anteriorly  CVS: S1 S2 regular GI: Bowel sounds present, Non tender and not distended with no gaurding, rigidity or rebound.No organomegaly Extremities: B/L Lower Ext shows +1 edema, both legs are warm to touch Neurology: Difficult exam-but seems to be moving all 4 extremities to pain. Musculoskeletal:No digital cyanosis Skin:No Rash, warm and dry Wounds:N/A  I have personally reviewed following labs and imaging studies  LABORATORY DATA: CBC: Recent Labs  Lab  01/29/19 0340 01/30/19 0346 01/31/19 0240 02/01/19 0404 02/02/19 0503  WBC 6.7 6.9 6.9 6.8 7.3  HGB 11.9* 11.1* 11.0* 12.2* 12.6*  HCT 35.3* 33.5* 33.4* 37.5* 36.4*  MCV 91.9 93.1 93.8 90.8 89.4  PLT 127* 118* 122* 128* 142*    Basic Metabolic Panel: Recent Labs  Lab 01/28/19 0347 01/29/19 0340 01/30/19 0346 01/31/19 0240 02/01/19 0404 02/02/19 0503  NA 134* 136 139 138 132* 131*  K 3.3* 3.8 3.9 3.4* 3.8 3.7  CL 105 105 109 108 102 100  CO2 20* 26 22 19* 22 20*  GLUCOSE 118* 131* 111* 122* 77 152*  BUN 13 12 15 15 14 12   CREATININE 0.89 0.95 0.92 0.98 0.97 1.01  CALCIUM 8.1* 8.4* 8.4* 8.1* 8.2* 8.5*  MG 2.0 2.0 2.0 2.0 2.0  --     GFR: Estimated Creatinine Clearance: 55.9 mL/min (by C-G formula based on SCr of 1.01 mg/dL).  Liver Function Tests: Recent Labs  Lab 01/29/19 0340 01/30/19 0346 01/31/19 0240 02/01/19 0404 02/02/19 0503  AST 27 22 19 18 19   ALT 62* 44 34 27 24  ALKPHOS 308* 258* 222* 209* 190*  BILITOT 1.5* 1.3* 1.2 1.2 1.2  PROT 6.2* 6.3* 6.0* 6.7 6.7  ALBUMIN 2.3* 2.3* 2.3* 2.4* 2.4*   No results for input(s): LIPASE, AMYLASE in the last 168 hours. No results for input(s): AMMONIA in the last 168 hours.  Coagulation Profile: Recent Labs  Lab 02/01/19 0404  INR 1.2    Cardiac Enzymes: No results for input(s): CKTOTAL, CKMB, CKMBINDEX, TROPONINI in the last 168 hours.  BNP (last 3 results) No results for input(s): PROBNP in the last 8760 hours.  HbA1C: No results for input(s): HGBA1C in the last 72 hours.  CBG: Recent Labs  Lab 02/01/19 1653 02/01/19 2005 02/02/19 0006 02/02/19 0431 02/02/19 0756  GLUCAP 155* 174* 171* 139* 151*    Lipid Profile: No results for input(s): CHOL, HDL, LDLCALC, TRIG, CHOLHDL, LDLDIRECT in the last 72 hours.  Thyroid Function Tests: No results for input(s): TSH, T4TOTAL, FREET4, T3FREE, THYROIDAB in the last 72 hours.  Anemia Panel: No results for input(s): VITAMINB12, FOLATE, FERRITIN,  TIBC, IRON, RETICCTPCT in the last 72 hours.  Urine analysis:    Component Value Date/Time   COLORURINE AMBER (A) 01/24/2019 2138   APPEARANCEUR HAZY (A) 01/24/2019 2138   LABSPEC 1.018 01/24/2019 2138   PHURINE 5.0 01/24/2019 2138   GLUCOSEU 50 (A) 01/24/2019 2138   HGBUR SMALL (A) 01/24/2019 2138   BILIRUBINUR SMALL (A) 01/24/2019 2138   KETONESUR NEGATIVE 01/24/2019 2138   PROTEINUR NEGATIVE 01/24/2019 2138   NITRITE NEGATIVE 01/24/2019 2138   LEUKOCYTESUR LARGE (A) 01/24/2019 2138    Sepsis Labs: Lactic Acid, Venous    Component Value Date/Time  LATICACIDVEN 1.8 01/25/2019 0536    MICROBIOLOGY: Recent Results (from the past 240 hour(s))  Blood Culture (routine x 2)     Status: None   Collection Time: 01/24/19  7:15 PM  Result Value Ref Range Status   Specimen Description BLOOD RIGHT ANTECUBITAL  Final   Special Requests   Final    BOTTLES DRAWN AEROBIC AND ANAEROBIC Blood Culture results may not be optimal due to an inadequate volume of blood received in culture bottles   Culture   Final    NO GROWTH 5 DAYS Performed at [facility], [place] [address], [place], [place] [address]    Report Status 01/29/2019 FINAL  Final  Blood Culture (routine x 2)     Status: None   Collection Time: 01/24/19  7:36 PM  Result Value Ref Range Status   Specimen Description BLOOD RIGHT HAND  Final   Special Requests   Final    BOTTLES DRAWN AEROBIC AND ANAEROBIC Blood Culture results may not be optimal due to an inadequate volume of blood received in culture bottles   Culture   Final    NO GROWTH 5 DAYS Performed at [facility], [place] [address] [place], [place] [address]    Report Status 01/29/2019 FINAL  Final  Aerobic/Anaerobic Culture (surgical/deep wound)     Status: None   Collection Time: 01/25/19  2:57 PM  Result Value Ref Range Status   Specimen Description GALL BLADDER  Final   Special Requests NONE  Final   Gram Stain   Final    ABUNDANT WBC PRESENT,  PREDOMINANTLY PMN MODERATE GRAM NEGATIVE RODS RARE GRAM POSITIVE COCCI    Culture   Final    ABUNDANT ENTEROBACTER HORMAECHEI FEW ENTEROCOCCUS FAECALIS NO ANAEROBES ISOLATED Performed at [facility], [place] [address], [place], [place] [address]    Report Status 02/01/2019 FINAL  Final   Organism ID, Bacteria ENTEROBACTER HORMAECHEI  Final   Organism ID, Bacteria ENTEROCOCCUS FAECALIS  Final      Susceptibility   Enterococcus faecalis - MIC*    AMPICILLIN <=2 SENSITIVE Sensitive     VANCOMYCIN 1 SENSITIVE Sensitive     GENTAMICIN SYNERGY SENSITIVE Sensitive     * FEW ENTEROCOCCUS FAECALIS   Enterobacter hormaechei - MIC*    CEFAZOLIN >=64 RESISTANT Resistant     CEFEPIME <=1 SENSITIVE Sensitive     CEFTAZIDIME >=64 RESISTANT Resistant     CEFTRIAXONE >=64 RESISTANT Resistant     CIPROFLOXACIN 0.5 SENSITIVE Sensitive     GENTAMICIN <=1 SENSITIVE Sensitive     IMIPENEM 0.5 SENSITIVE Sensitive     TRIMETH/SULFA <=20 SENSITIVE Sensitive     PIP/TAZO >=128 RESISTANT Resistant     * ABUNDANT ENTEROBACTER HORMAECHEI    RADIOLOGY STUDIES/RESULTS: Ir Sinus/fist Tube Chk-non Gi  Result Date: 01/28/2019 INDICATION: 85 year old with cholecystostomy tube that was recently replaced. The retention suture has been pulled out of the skin. EXAM: DRAIN INJECTION WITH FLUOROSCOPY COMPARISON:  None. MEDICATIONS: None ANESTHESIA/SEDATION: None COMPLICATIONS: None immediate. TECHNIQUE: Drain was injected with contrast under fluoroscopy. 10 mL Omnipaque 300 was injected. PROCEDURE: The drain and surrounding skin was prepped with chlorhexidine. A new 0-Prolene suture was used to secure the catheter to the skin. FINDINGS: Cholecystostomy tube remains within the gallbladder. Again noted are multiple filling defects compatible with gallstones. IMPRESSION: Cholecystostomy tube remains in the gallbladder. Drain was secured to skin with new suture. Electronically Signed   By: Markus Daft M.D.   On:  01/28/2019  17:47   Mr 3d Recon At Scanner  Result Date: 01/28/2019 CLINICAL DATA:  85 year old male with history of acute cholecystitis in February 2094, complicated by sepsis and respiratory failure. Status post cholecystostomy drainage. Readmission for lethargy, fever, nausea and leukocytosis with newly increased liver function tests. EXAM: MRI ABDOMEN WITHOUT AND WITH CONTRAST (INCLUDING MRCP) TECHNIQUE: Multiplanar multisequence MR imaging of the abdomen was performed both before and after the administration of intravenous contrast. Heavily T2-weighted images of the biliary and pancreatic ducts were obtained, and three-dimensional MRCP images were rendered by post processing. CONTRAST:  9 mL of Gadavist. COMPARISON:  No prior abdominal MRI. CT the abdomen and pelvis 11/21/2018. FINDINGS: Comment: Today's study is limited by considerable patient motion. Lower chest: Small bilateral pleural effusions lying dependently. Signal void in the region of the aortic root related to prior TAVR procedure. Hepatobiliary: No discrete cystic or solid hepatic lesions are confidently identified on today's motion limited examination. MRCP images are largely nondiagnostic. However, other T2 weighted sequences demonstrate no definite intrahepatic biliary ductal dilatation. A percutaneous cholecystostomy tube is noted. Multiple filling defects within the gallbladder are compatible with multiple gallstones. Gallbladder does not appear overly distended, and there is no large volume of pericholecystic fluid noted at this time. A trace amount of irregular T2 signal intensity is noted adjacent to the gallbladder, likely within normal limits given the indwelling percutaneous cholecystostomy tube. Innumerable filling defects are also noted within the common bile duct, compatible with choledocholithiasis. Common bile duct appears to measure up to 13 mm distally. Pancreas: Pancreatic atrophy. Multiple well-defined T1 hypointense, T2 hyperintense,  nonenhancing lesions are noted throughout the body of the pancreas, measuring up to 1.4 cm (axial image 25 of series 4). No pancreatic ductal dilatation. No peripancreatic fluid collections or inflammatory changes. Spleen:  Unremarkable. Adrenals/Urinary Tract: There are multiple T1 hypointense, T2 hyperintense, nonenhancing lesions in the kidneys bilaterally, compatible with simple cysts, largest of which is exophytic in the posterior aspect of the upper pole of the right kidney measuring 5 cm. No hydroureteronephrosis in the visualized portions of the abdomen. Bilateral adrenal glands are normal in appearance. Stomach/Bowel: Visualized portions are unremarkable. Vascular/Lymphatic: Aortic atherosclerosis with fusiform aneurysmal dilatation of the infrarenal abdominal aorta which measures up to 4.2 x 5.2 cm. No lymphadenopathy identified in the abdomen. Other: No significant volume of ascites noted in the visualized portions of the peritoneal cavity. Musculoskeletal: No aggressive appearing lytic or blastic lesions noted in the visualized portions of the skeleton. IMPRESSION: 1. Choledocholithiasis with dilated common bile duct measuring up to 13 mm in the porta hepatis. Despite the presence of numerous stones in this mildly dilated duct, there is no intrahepatic biliary ductal dilatation noted at this time. 2. Cholelithiasis with percutaneous cholecystostomy tube in place. Minimal surrounding T2 hyperintensity adjacent to the gallbladder which is likely within normal limits for a patient with an indwelling percutaneous cholecystostomy tube. 3. Aortic atherosclerosis with fusiform dilatation of the infrarenal abdominal aorta. 4. Small bilateral pleural effusions lying dependently. Electronically Signed   By: Vinnie Langton M.D.   On: 01/28/2019 08:04   Dg Chest Port 1 View  Result Date: 01/24/2019 CLINICAL DATA:  Tachypnea and possible sepsis EXAM: PORTABLE CHEST 1 VIEW COMPARISON:  12/12/2018 FINDINGS:  Cardiac shadow is enlarged. Changes of prior TAVR are again seen. Aortic calcifications are noted. The lungs are well aerated bilaterally. Mild diffuse interstitial changes are noted similar to that seen on the prior exam. Patchy atelectasis/early infiltrate is noted in the  left base. No bony abnormality is seen. IMPRESSION: Patchy left basilar atelectasis/early infiltrate. Electronically Signed   By: Inez Catalina M.D.   On: 01/24/2019 20:10   Dg Ercp Biliary & Pancreatic Ducts  Result Date: 01/29/2019 CLINICAL DATA:  85 year old male with choledocholithiasis undergoing ERCP. EXAM: ERCP TECHNIQUE: Multiple spot images obtained with the fluoroscopic device and submitted for interpretation post-procedure. FLUOROSCOPY TIME:  Fluoroscopy Time:  3 minutes 14 seconds. COMPARISON:  MRCP 01/27/2019. FINDINGS: A total of 5 intraoperative spot views are submitted for review. The images demonstrate a flexible endoscope in the descending duodenum followed by wire cannulation of the common bile duct and cholangiogram. Multiple filling defects are present throughout the common bile duct consistent with choledocholithiasis. Subsequent images demonstrate sphincterotomy and balloon sweeping of the common duct. A pigtail drainage catheter is also evident in the right upper quadrant consistent with the recently placed percutaneous cholecystostomy tube. IMPRESSION: ERCP with sphincterotomy and balloon sweep of the common duct. These images were submitted for radiologic interpretation only. Please see the procedural report for the amount of contrast and the fluoroscopy time utilized. Electronically Signed   By: Jacqulynn Cadet M.D.   On: 01/29/2019 11:27   Mr Abdomen Mrcp Moise Boring Contast  Result Date: 01/28/2019 CLINICAL DATA:  85 year old male with history of acute cholecystitis in February 2841, complicated by sepsis and respiratory failure. Status post cholecystostomy drainage. Readmission for lethargy, fever, nausea and  leukocytosis with newly increased liver function tests. EXAM: MRI ABDOMEN WITHOUT AND WITH CONTRAST (INCLUDING MRCP) TECHNIQUE: Multiplanar multisequence MR imaging of the abdomen was performed both before and after the administration of intravenous contrast. Heavily T2-weighted images of the biliary and pancreatic ducts were obtained, and three-dimensional MRCP images were rendered by post processing. CONTRAST:  9 mL of Gadavist. COMPARISON:  No prior abdominal MRI. CT the abdomen and pelvis 11/21/2018. FINDINGS: Comment: Today's study is limited by considerable patient motion. Lower chest: Small bilateral pleural effusions lying dependently. Signal void in the region of the aortic root related to prior TAVR procedure. Hepatobiliary: No discrete cystic or solid hepatic lesions are confidently identified on today's motion limited examination. MRCP images are largely nondiagnostic. However, other T2 weighted sequences demonstrate no definite intrahepatic biliary ductal dilatation. A percutaneous cholecystostomy tube is noted. Multiple filling defects within the gallbladder are compatible with multiple gallstones. Gallbladder does not appear overly distended, and there is no large volume of pericholecystic fluid noted at this time. A trace amount of irregular T2 signal intensity is noted adjacent to the gallbladder, likely within normal limits given the indwelling percutaneous cholecystostomy tube. Innumerable filling defects are also noted within the common bile duct, compatible with choledocholithiasis. Common bile duct appears to measure up to 13 mm distally. Pancreas: Pancreatic atrophy. Multiple well-defined T1 hypointense, T2 hyperintense, nonenhancing lesions are noted throughout the body of the pancreas, measuring up to 1.4 cm (axial image 25 of series 4). No pancreatic ductal dilatation. No peripancreatic fluid collections or inflammatory changes. Spleen:  Unremarkable. Adrenals/Urinary Tract: There are  multiple T1 hypointense, T2 hyperintense, nonenhancing lesions in the kidneys bilaterally, compatible with simple cysts, largest of which is exophytic in the posterior aspect of the upper pole of the right kidney measuring 5 cm. No hydroureteronephrosis in the visualized portions of the abdomen. Bilateral adrenal glands are normal in appearance. Stomach/Bowel: Visualized portions are unremarkable. Vascular/Lymphatic: Aortic atherosclerosis with fusiform aneurysmal dilatation of the infrarenal abdominal aorta which measures up to 4.2 x 5.2 cm. No lymphadenopathy identified in the abdomen. Other:  No significant volume of ascites noted in the visualized portions of the peritoneal cavity. Musculoskeletal: No aggressive appearing lytic or blastic lesions noted in the visualized portions of the skeleton. IMPRESSION: 1. Choledocholithiasis with dilated common bile duct measuring up to 13 mm in the porta hepatis. Despite the presence of numerous stones in this mildly dilated duct, there is no intrahepatic biliary ductal dilatation noted at this time. 2. Cholelithiasis with percutaneous cholecystostomy tube in place. Minimal surrounding T2 hyperintensity adjacent to the gallbladder which is likely within normal limits for a patient with an indwelling percutaneous cholecystostomy tube. 3. Aortic atherosclerosis with fusiform dilatation of the infrarenal abdominal aorta. 4. Small bilateral pleural effusions lying dependently. Electronically Signed   By: Vinnie Langton M.D.   On: 01/28/2019 08:04   Ir Exchange Biliary Drain  Result Date: 01/25/2019 INDICATION: 85 year old with biliary sepsis and history of a cholecystostomy tube. EXAM: CHOLECYSTOSTOMY TUBE EXCHANGE WITH FLUOROSCOPY CHOLANGIOGRAM THROUGH EXISTING CATHETER MEDICATIONS: None ANESTHESIA/SEDATION: None FLUOROSCOPY TIME:  Fluoroscopy Time: 1 minutes, 12 seconds, 9 mGy COMPLICATIONS: None immediate. PROCEDURE: Informed written consent was obtained from the  patient after a thorough discussion of the procedural risks, benefits and alternatives. All questions were addressed. Maximal Sterile Barrier Technique was utilized including caps, mask, sterile gowns, sterile gloves, sterile drape, hand hygiene and skin antiseptic. A timeout was performed prior to the initiation of the procedure. The existing gallbladder drainage catheter was prepped and draped in a sterile fashion. The retention suture was still intact. It was difficult to flush the catheter and the tube appeared to be partially obstructed. Purulent looking fluid was draining around the catheter during injection of the tube. The tube was found to be within the gallbladder. The retention suture was removed. The catheter was cut. The catheter was removed over a stiff Amplatz wire. A new 12 [place] multipurpose drain was easily advanced over the wire and reconstituted in the gallbladder. Approximately 40 mL of light brown purulent looking fluid was removed from the gallbladder. Small amount of contrast was injected through the gallbladder drain. Drain was flushed with normal saline. Skin was anesthetized with 1% lidocaine and sutured to skin with Prolene suture. FINDINGS: Old catheter was well positioned in the gallbladder but the tube appeared to be partially obstructed. Greater than 40 mL of brown purulent looking fluid was removed from the gallbladder after the drain exchange. Fluid was sent for culture. Cholangiogram demonstrated partial filling of the cystic duct but no filling of the common bile duct. Innumerable gallstones. IMPRESSION: 1. Successful exchange of the cholecystostomy tube. 2. Innumerable gallstones. Unable to opacify the common bile duct due to obstruction of the cystic duct. 3. Purulent looking fluid removed from the gallbladder. Fluid sent for culture. Electronically Signed   By: Markus Daft M.D.   On: 01/25/2019 17:11   Ir Exchange Biliary Drain  Result Date: 01/05/2019 INDICATION: History  of acute cholecystitis, poor operative candidate, post image guided cholecystostomy tube placement on 11/22/2018. Patient recently returned for fluoroscopic guided exchange and repositioning on 12/18/2018 secondary to inverted retraction of the existing cholecystostomy tube and presents today for the same reason. EXAM: FLUOROSCOPIC GUIDED CHOLECYSTOSTOMY TUBE EXCHANGE AND UP SIZING COMPARISON:  Image guided cholecystostomy tube placement-11/22/2018; fluoroscopic guided cholecystostomy tube exchange replacement-12/18/2018 MEDICATIONS: None ANESTHESIA/SEDATION: None CONTRAST:  67mL OMNIPAQUE IOHEXOL 300 MG/ML SOLN - administered into the gallbladder fossa. FLUOROSCOPY TIME:  3 minutes, 42 seconds (428 mGy) COMPLICATIONS: None immediate. PROCEDURE: Informed written consent was obtained from the patient's wife after a discussion of  the risks, benefits and alternatives to treatment. Questions regarding the procedure were encouraged and answered. A timeout was performed prior to the initiation of the procedure. The external portion of the existing cholecystostomy tube as well as the surrounding skin was prepped and draped in usual sterile fashion. Preprocedural spot fluoroscopic image was obtained of the right upper abdominal quadrant demonstrating retraction of the cholecystostomy tube with end coiled and locked peripheral to the expected location of the gallbladder lumen. Contrast was injected via the existing cholecystostomy tube however nearly all the injected contrast reflux along the cholecystostomy track to the skin entrance site. As such, the external portion of the existing malposition cholecystostomy tube was cut and the tube was removed intact. The entrance site of the prior cholecystostomy tube was cannulated with a Christmas tree adapter and contrast injection was performed demonstrating patency of the cholecystostomy track with opacification of the gallbladder lumen. With the use of a regular glidewire, a  Kumpe catheter was advanced through the serpiginous cholecystostomy track to the level of the gallbladder lumen. Contrast injection confirmed appropriate positioning. Next, over a short Amplatz wire, the existing Kumpe catheter was exchanged for a new slightly larger, now 76 [place], cholecystostomy tube with end ultimately coiled and locked within the gallbladder lumen. Limited contrast injection confirmed appropriate position functionality of the new slightly larger cholecystostomy tube. The external portion of the cholecystostomy tube was secured at the skin entrance site within interrupted suture and a Stat Lock device. Cholecystostomy tube was reconnected to a gravity bag. A dressing was placed. The patient tolerated the procedure well without immediate postprocedural complication. IMPRESSION: Successful fluoroscopic guided replacement and up sizing of now 79 French cholecystostomy tube. Electronically Signed   By: Sandi Mariscal M.D.   On: 01/05/2019 15:02   US Abdomen Limited Ruq  Result Date: 01/24/2019 CLINICAL DATA:  85 y/o  M; transaminitis. EXAM: ULTRASOUND ABDOMEN LIMITED RIGHT UPPER QUADRANT COMPARISON:  11/21/2018 CT abdomen and pelvis and abdominal ultrasound. 11/22/2018, 12/18/2018, 01/05/2019 interventional radiology cholecystostomy and cholecystostomy exchanges. FINDINGS: Gallbladder: Cholelithiasis with the largest stone measuring 9 mm near the gallbladder neck. Gallbladder wall thickening to 4.9 mm. Negative sonographic Murphy's sign. No pericholecystic fluid. Linear echogenic structure likely representing the cholecystostomy tube is partially visualized within gallbladder fossa, limited evaluation due to overlying bandaging. Common bile duct: Diameter: 7.3 mm Liver: No focal lesion identified. Within normal limits in parenchymal echogenicity. Portal vein is patent on color Doppler imaging with normal direction of blood flow towards the liver. IMPRESSION: 1. Exam limited due to overlying  bandaging in the right upper quadrant. 2. Mild gallbladder wall thickening, probably chronic sequelae of cholecystitis and cholecystostomy tube. Negative sonographic Murphy's sign. Cholelithiasis again noted. 3. No significant intra or extrahepatic biliary ductal dilatation identified. Electronically Signed   By: Kristine Garbe M.D.   On: 01/24/2019 22:25     LOS: 9 days   Oren Binet, MD  [facility]  If 7PM-7AM, please contact night-coverage  Please page via [email or web address]  Go to [email or web address] and use [facility] universal password to access. If you do not have the password, please contact the hospital operator.  Locate the [facility] provider you are looking for under [facility] and page to a number that you can be directly reached. If you still have difficulty reaching the provider, please page the [facility] (Director on Call) for the Hospitalists listed on amion for assistance.  02/02/2019, 10:58 AM

## 2019-02-03 ENCOUNTER — Inpatient Hospital Stay (HOSPITAL_COMMUNITY): Payer: Medicare Other

## 2019-02-03 DIAGNOSIS — R627 Adult failure to thrive: Secondary | ICD-10-CM

## 2019-02-03 DIAGNOSIS — Z66 Do not resuscitate: Secondary | ICD-10-CM

## 2019-02-03 LAB — COMPREHENSIVE METABOLIC PANEL
ALT: 22 U/L (ref 0–44)
AST: 17 U/L (ref 15–41)
Albumin: 2.5 g/dL — ABNORMAL LOW (ref 3.5–5.0)
Alkaline Phosphatase: 183 U/L — ABNORMAL HIGH (ref 38–126)
Anion gap: 9 (ref 5–15)
BUN: 12 mg/dL (ref 8–23)
CO2: 20 mmol/L — ABNORMAL LOW (ref 22–32)
Calcium: 8.7 mg/dL — ABNORMAL LOW (ref 8.9–10.3)
Chloride: 102 mmol/L (ref 98–111)
Creatinine, Ser: 1.03 mg/dL (ref 0.61–1.24)
GFR calc Af Amer: >60 mL/min (ref >60)
GFR calc non Af Amer: >60 mL/min (ref >60)
Glucose, Bld: 176 mg/dL — ABNORMAL HIGH (ref 70–99)
Potassium: 3.9 mmol/L (ref 3.5–5.1)
Sodium: 131 mmol/L — ABNORMAL LOW (ref 135–145)
Total Bilirubin: 1.3 mg/dL — ABNORMAL HIGH (ref 0.3–1.2)
Total Protein: 6.9 g/dL (ref 6.5–8.1)

## 2019-02-03 LAB — CBC
HCT: 36.1 % — ABNORMAL LOW (ref 39.0–52.0)
Hemoglobin: 12.7 g/dL — ABNORMAL LOW (ref 13.0–17.0)
MCH: 31 pg (ref 26.0–34.0)
MCHC: 35.2 g/dL (ref 30.0–36.0)
MCV: 88 fL (ref 80.0–100.0)
Platelets: 163 10*3/uL (ref 150–400)
RBC: 4.1 MIL/uL — ABNORMAL LOW (ref 4.22–5.81)
RDW: 15.7 % — ABNORMAL HIGH (ref 11.5–15.5)
WBC: 10.4 10*3/uL (ref 4.0–10.5)
nRBC: 0 % (ref 0.0–0.2)

## 2019-02-03 LAB — GLUCOSE, CAPILLARY
Glucose-Capillary: 112 mg/dL — ABNORMAL HIGH (ref 70–99)
Glucose-Capillary: 147 mg/dL — ABNORMAL HIGH (ref 70–99)
Glucose-Capillary: 148 mg/dL — ABNORMAL HIGH (ref 70–99)
Glucose-Capillary: 152 mg/dL — ABNORMAL HIGH (ref 70–99)
Glucose-Capillary: 161 mg/dL — ABNORMAL HIGH (ref 70–99)
Glucose-Capillary: 164 mg/dL — ABNORMAL HIGH (ref 70–99)
Glucose-Capillary: 168 mg/dL — ABNORMAL HIGH (ref 70–99)
Glucose-Capillary: 170 mg/dL — ABNORMAL HIGH (ref 70–99)
Glucose-Capillary: 187 mg/dL — ABNORMAL HIGH (ref 70–99)

## 2019-02-03 LAB — AMMONIA: Ammonia: 20 umol/L (ref 9–35)

## 2019-02-03 LAB — TSH: TSH: 1.596 u[IU]/mL (ref 0.350–4.500)

## 2019-02-03 LAB — VITAMIN B12: Vitamin B-12: 395 pg/mL (ref 180–914)

## 2019-02-03 MED ORDER — MIDODRINE HCL 5 MG PO TABS
2.5000 mg | ORAL_TABLET | Freq: Three times a day (TID) | ORAL | Status: DC
  Administered 2019-02-04 – 2019-02-08 (×12): 2.5 mg via ORAL
  Filled 2019-02-03 (×13): qty 1

## 2019-02-03 MED ORDER — SODIUM CHLORIDE 0.9 % IV BOLUS
250.0000 mL | Freq: Once | INTRAVENOUS | Status: AC
  Administered 2019-02-03: 250 mL via INTRAVENOUS

## 2019-02-03 MED ORDER — SODIUM CHLORIDE 0.9 % IV SOLN
INTRAVENOUS | Status: DC
  Administered 2019-02-03: 12:00:00 via INTRAVENOUS

## 2019-02-03 MED ORDER — SODIUM CHLORIDE 0.9 % IV SOLN
INTRAVENOUS | Status: DC
  Administered 2019-02-04 – 2019-02-05 (×2): via INTRAVENOUS

## 2019-02-03 MED ORDER — VITAMIN B-12 100 MCG PO TABS
250.0000 ug | ORAL_TABLET | Freq: Every day | ORAL | Status: DC
  Administered 2019-02-04 – 2019-02-08 (×5): 250 ug via ORAL
  Filled 2019-02-03 (×5): qty 3

## 2019-02-03 MED ORDER — CYANOCOBALAMIN 1000 MCG/ML IJ SOLN
1000.0000 ug | Freq: Once | INTRAMUSCULAR | Status: AC
  Administered 2019-02-03: 1000 ug via INTRAMUSCULAR
  Filled 2019-02-03: qty 1

## 2019-02-03 NOTE — Consult Note (Signed)
   [facility] CM Inpatient Consult   0/06/2329  [place] 0[birth year] [phone]   Patient screened for extreme high risk score [38%] and with 5 hospitalizations and 2 ED visits in the Medicare Next Gen.  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   Patient underwent cholecystostomy tube  revision by IR, and also underwent ERCP. Although sepsis pathophysiology has improved-further hospital course complicated by acute encephalopathy.   Notes describes patient as lethargic. Will reach out to inpatient [facility] team as patient has an outreach history with [facility] programs in the past. Primary Care Provider is listed with Dr. Hollace Kinnier. Will continue to follow for progress and dispostion needs in the community as appropriate.  Please place a [facility] Care Management consult or for questions contact:   Natividad Brood, RN BSN [facility] Liaison  [phone] business mobile phone Toll free office [phone]

## 2019-02-03 NOTE — Procedures (Signed)
ELECTROENCEPHALOGRAM REPORT   Patient: William [name]       Room #: 9Y72J EEG No. ID: [id number] Age: 85 y.o.        Sex: male Referring Physician: Ghimire Report Date:  02/03/2019        Interpreting Physician: Alexis Goodell  History: William [name] is an 85 y.o. male with altered mental status  Medications:  Pacerone, Coreg, Insulin, Merrem, Protonix  Conditions of Recording:  This is a 21 channel routine scalp EEG performed with bipolar and monopolar montages arranged in accordance to the international 10/20 system of electrode placement. One channel was dedicated to EKG recording.  The patient is in the altered state.  Description:  The waking background activity is slow and poorly organized.  It consists of a low voltage, mixture of frequencies with delta and theta rhythms being most dominant.  When faster frequencies are seen they are mostly in the central regions.  This activity is diffusely distributed but discontinuous.  There are infrequent but intermittent short periods (up to 2 seconds) of attenuation.  These are synchronous between both hemispheres. No epileptiform activity is noted.   Hyperventilation was not performed.  Intermittent photic stimulation was performed but failed to illicit any change in the tracing.    IMPRESSION: This is an abnormal EEG secondary to discontinuous general background slowing.  This finding may be seen with a diffuse disturbance that is etiologically nonspecific, but may include a metabolic encephalopathy, among other possibilities.  No epileptiform activity was noted.     Alexis Goodell, MD Neurology [phone] 02/03/2019, 9:12 AM

## 2019-02-03 NOTE — Progress Notes (Signed)
Patient turned and repositioned during the night and provided oral suction for small amount of clear/white sputum.  Patient only responds to pain; will verbalize "oww/ouch" with movement of left arm. Wife indicated left arm discomfort began after returning from CT scan/MRI. Arm tender to palpation with bruising and mild swelling at wrist. Patient has bruising bilateral arms.   Patient tachypnic (22-27) and tachycardic (100-105) throughout the night.   Patient not following commands; did not administer oral Protonix or perform oral care.   Paged [employer] at [phone] to renew order for 7P-7A sitter; since patient is disoriented and will pull at lines and biliary drain.  Total of 33mL biliary output. Output yellow blood tinged mucous with fine grain-like gallstones.

## 2019-02-03 NOTE — Progress Notes (Addendum)
During reassessment at 89, patient alert with eyes open when RN entered room. Patient able to state full name when asked, however, unable to state birthday or place/time. Patient able to state he was ok and no when asked if he was in pain. Patient staring distantly, but able to track RN with eyes. Patient able to state wife's name "Webb Silversmith" when asked.   RN palpated left hand/wrist to assess pain based on patient's prior complaints of pain. Patient shook head  "no" when palpated hand and wrist and asked if it hurt.  Patient has some difficulty following commands. Needed coaching to prompt motor response (hand grip and wiggle toes).    Disoriented incoherent/rambling speech intermittently when alert.  Total 35 mL biliary output overnight. Output thin yellow mucous with some blood streaks and coffee grain-like gallstones.

## 2019-02-03 NOTE — Progress Notes (Signed)
PROGRESS NOTE        PATIENT DETAILS Name: William [name] Age: 85 y.o. 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   Patient underwent cholecystostomy tube  revision by IR, and also underwent ERCP. Although sepsis pathophysiology has improved-further hospital course complicated by acute encephalopathy.  General surgery, GI, ID and palliative care following. See below for further details  Subjective: Remains lethargic-but with gentle painful stimuli-wakes up-and pushes me away-"leave me alone".  Moving all 4 extremities with painful stimuli this morning.  Assessment/Plan: Severe sepsis secondary to acute cholecystitis, cholangitis:  Sepsisphysiology has improved- s/p percutaneous cholecystostomy tube exchange on 4/13-and ERCP with sphincterotomy on 4/17.   Bile cultures on 4/13 + for enterococcus and Enterobacter-ID following-remains on meropenem.  Blood cultures are negative.  Very difficult situation-patient is very frail with numerous medical comorbidities-which makes surgical risk high-unfortunately with post ERCP he has developed encephalopathy-and family is very hesitant in pursuing cholecystectomy.  This MD had a long discussion with the spouse at bedside yesterday-she is aware of the potential for recurrent infections the gallbladder  remains in place.  Family is reluctant to consider hospice at this point.  Patient is very frail-think his overall prognosis is not that great.  Evaluated by cardiology for preoperative risk assessment-felt to to be high risk for any surgical procedure. General surgery/palliative care following.  Acute metabolic encephalopathy:Remains lethargic and confused-per family-this worsened post ERCP. suspect patient has some cognitive dysfunction at baseline-and discharged worsened secondary to sedation/anesthesia for ERCP, ongoing sepsis etc.  CT head/MRI brain on 4/21 without any acute abnormalities-awaiting EEG.  Continue supportive care-avoid sedative/narcotics as much as possible-neurology following as well.  History of ventricular tachycardia had AICD in place but this was explanted in December 2019 for ESBL producing Klebsiella lead endocarditis: Monitored in telemetry-continue Coreg and amiodarone  History of severe aortic stenosis s/p TAVR in November 2019:TEE on 2/13 with stable bioprosthetic aortic valve in place.    Chronic systolic heart failure (EF 25-30% by TTE on 11/23/2018):Volume status appears relatively stable-follow.  CAD: No anginal symptoms-Plavix on hold  Severe deconditioning/debility: Secondary to acute illness-and numerous recent hospitalizations-we will need PT evaluation.  AAA (4.6 x 4 cm infrarenal): Stable for outpatient follow-up.  Multiple small pulmonary nodules throughout the lung seen on CT chest on 08/03/2018:Stable for outpatient follow-up  DVT Prophylaxis: Prophylactic Heparin   Code Status: Full code  Family Communication: None at bedside this morning-would reach out to patient's spouse later today.  Disposition Plan: Remain inpatient-but will plan on Home health vs SNF on discharge  Antimicrobial agents: Anti-infectives (From admission, onward)   Start     Dose/Rate Route Frequency Ordered Stop   02/01/19 2200  ceFEPIme (MAXIPIME) 2 g in  sodium chloride 0.9 % 100 mL IVPB  Status:  Discontinued     2 g 200 mL/hr over 30 Minutes Intravenous Every  12 hours 02/01/19 1044 02/01/19 1151   02/01/19 1300  meropenem (MERREM) 1 g in sodium chloride 0.9 % 100 mL IVPB     1 g 200 mL/hr over 30 Minutes Intravenous Every 8 hours 02/01/19 1151     01/28/19 1400  ceFEPIme (MAXIPIME) 2 g in sodium chloride 0.9 % 100 mL IVPB  Status:  Discontinued     2 g 200 mL/hr over 30 Minutes Intravenous Every 8 hours 01/28/19 0830 02/01/19 1044   01/25/19 2200  vancomycin (VANCOCIN) 1,500 mg in sodium chloride 0.9 % 500 mL IVPB  Status:  Discontinued     1,500 mg 250 mL/hr over 120 Minutes Intravenous Every 24 hours 01/24/19 2027 01/26/19 1124   01/25/19 0200  ceFEPIme (MAXIPIME) 2 g in sodium chloride 0.9 % 100 mL IVPB  Status:  Discontinued     2 g 200 mL/hr over 30 Minutes Intravenous Every 12 hours 01/24/19 2211 01/28/19 0830   01/24/19 2200  ceFEPIme (MAXIPIME) 2 g in sodium chloride 0.9 % 100 mL IVPB  Status:  Discontinued     2 g 200 mL/hr over 30 Minutes Intravenous  Once 01/24/19 2158 01/24/19 2208   01/24/19 2200  metroNIDAZOLE (FLAGYL) IVPB 500 mg  Status:  Discontinued     500 mg 100 mL/hr over 60 Minutes Intravenous Every 8 hours 01/24/19 2158 02/01/19 1151   01/24/19 1945  piperacillin-tazobactam (ZOSYN) IVPB 3.375 g     3.375 g 100 mL/hr over 30 Minutes Intravenous  Once 01/24/19 1931 01/24/19 2017   01/24/19 1930  ceFEPIme (MAXIPIME) 2 g in sodium chloride 0.9 % 100 mL IVPB  Status:  Discontinued     2 g 200 mL/hr over 30 Minutes Intravenous  Once 01/24/19 1918 01/24/19 2010   01/24/19 1930  metroNIDAZOLE (FLAGYL) IVPB 500 mg  Status:  Discontinued     500 mg 100 mL/hr over 60 Minutes Intravenous  Once 01/24/19 1918 01/24/19 1931   01/24/19 1930  vancomycin (VANCOCIN) IVPB 1000 mg/200 mL premix  Status:  Discontinued     1,000 mg 200 mL/hr over 60 Minutes Intravenous  Once 01/24/19 1918 01/24/19 1922   01/24/19 1930  vancomycin  (VANCOCIN) 2,000 mg in sodium chloride 0.9 % 500 mL IVPB     2,000 mg 250 mL/hr over 120 Minutes Intravenous  Once 01/24/19 1922 01/25/19 0012      Procedures: 4/13 cholecystostomy tube exchange with fluoroscopy 4/16: Drain check and placement of new suture 4/17 ERCP  CONSULTS:  cardiology, GI, general surgery and Palliative care  Time spent: 25- minutes-Greater than 50% of this time was spent in counseling, explanation of diagnosis, planning of further management, and coordination of care.  MEDICATIONS: Scheduled Meds:  amiodarone  100 mg Oral Daily   carvedilol  3.125 mg Oral BID WC   heparin injection (subcutaneous)  5,000 Units Subcutaneous Q8H   insulin aspart  0-9 Units Subcutaneous Q4H   mouth rinse  15 mL Mouth Rinse BID   pantoprazole  40 mg Oral QHS   sodium chloride flush  3 mL Intravenous Q12H   sodium chloride flush  3 mL Intravenous Q12H   sodium chloride flush  5 mL Intracatheter Q8H   Continuous Infusions:  sodium chloride 250 mL (01/30/19 1736)   dextrose 5 % with kcl Stopped (02/03/19 4967)   meropenem (MERREM) IV 1 g (02/03/19 0632)   PRN Meds:.sodium chloride, acetaminophen **OR** acetaminophen, iohexol, iohexol, lidocaine (PF), ondansetron **OR** ondansetron (ZOFRAN) IV, sodium chloride flush, sodium chloride flush  PHYSICAL EXAM: Vital signs: Vitals:   02/03/19 0057 02/03/19 0059 02/03/19 0435 02/03/19 0444  BP: 119/84 102/84 112/78   Pulse: (!) 102 (!) 52 63 (!) 103  Resp: (!) 0 16 (!) 27 20  Temp:   98.5 F (36.9 C)   TempSrc:   Axillary   SpO2: 96% 96% 94% 94%  Weight:      Height:       Filed Weights   01/30/19 0607 01/31/19 0433 02/02/19 0500  Weight: 91.6 kg 92 kg 89.8 kg   Body mass index is 28.41 kg/m.   General appearance: Lethargic-not in any distress-awakes with gentle painful stimuli. Eyes:no scleral icterus. HEENT: Atraumatic and Normocephalic Neck: supple, no JVD. Resp:Good air entry bilaterally,no rales or  rhonchi heard anteriorly CVS: S1 S2 regular GI: Bowel sounds present, Non tender and not distended with no gaurding, rigidity or rebound. Extremities: B/L Lower Ext shows trace edema, both legs are warm to touch Neurology: Difficult exam-but appears to be moving all 4 extremities in response to pain.   Musculoskeletal:No digital cyanosis Skin:No Rash, warm and dry Wounds:N/A  I have personally reviewed following labs and imaging studies  LABORATORY DATA: CBC: Recent Labs  Lab 01/30/19 0346 01/31/19 0240 02/01/19 0404 02/02/19 0503 02/03/19 0349  WBC 6.9 6.9 6.8 7.3 10.4  HGB 11.1* 11.0* 12.2* 12.6* 12.7*  HCT 33.5* 33.4* 37.5* 36.4* 36.1*  MCV 93.1 93.8 90.8 89.4 88.0  PLT 118* 122* 128* 142* 599    Basic Metabolic Panel: Recent Labs  Lab 01/28/19 0347 01/29/19 0340 01/30/19 0346 01/31/19 0240 02/01/19 0404 02/02/19 0503 02/03/19 0349  NA 134* 136 139 138 132* 131* 131*  K 3.3* 3.8 3.9 3.4* 3.8 3.7 3.9  CL 105 105 109 108 102 100 102  CO2 20* 26 22 19* 22 20* 20*  GLUCOSE 118* 131* 111* 122* 77 152* 176*  BUN 13 12 15 15 14 12 12   CREATININE 0.89 0.95 0.92 0.98 0.97 1.01 1.03  CALCIUM 8.1* 8.4* 8.4* 8.1* 8.2* 8.5* 8.7*  MG 2.0 2.0 2.0 2.0 2.0  --   --     GFR: Estimated Creatinine Clearance: 54.8 mL/min (by C-G formula based on SCr of 1.03 mg/dL).  Liver Function Tests: Recent Labs  Lab 01/30/19 0346 01/31/19 0240 02/01/19 0404 02/02/19 0503 02/03/19 0349  AST 22 19 18 19 17   ALT 44 34 27 24 22   ALKPHOS 258* 222* 209* 190* 183*  BILITOT 1.3* 1.2 1.2 1.2 1.3*  PROT 6.3* 6.0* 6.7 6.7 6.9  ALBUMIN 2.3* 2.3* 2.4* 2.4* 2.5*   No results for input(s): LIPASE, AMYLASE in the last 168 hours. Recent Labs  Lab 02/03/19 0349  AMMONIA 20    Coagulation Profile: Recent Labs  Lab 02/01/19 0404  INR 1.2    Cardiac Enzymes: No results for input(s): CKTOTAL, CKMB, CKMBINDEX, TROPONINI in the last 168 hours.  BNP (last 3 results) No results for  input(s): PROBNP in the last 8760 hours.  HbA1C: No results for input(s): HGBA1C in the last 72 hours.  CBG: Recent Labs  Lab 02/02/19 1627 02/02/19 2015 02/03/19 0012 02/03/19 0510 02/03/19 0753  GLUCAP 132* 142* 152* 187* 147*    Lipid Profile: No results for input(s): CHOL, HDL, LDLCALC, TRIG, CHOLHDL, LDLDIRECT in the last 72 hours.  Thyroid Function Tests: No results for input(s): TSH, T4TOTAL, FREET4, T3FREE, THYROIDAB in the last 72 hours.  Anemia Panel: No results for input(s): VITAMINB12, FOLATE, FERRITIN, TIBC, IRON, RETICCTPCT in the last 72 hours.  Urine analysis:    Component Value Date/Time   COLORURINE AMBER (A) 01/24/2019 2138   APPEARANCEUR HAZY (A) 01/24/2019 2138   LABSPEC 1.018 01/24/2019 2138   PHURINE 5.0 01/24/2019 2138   GLUCOSEU 50 (A) 01/24/2019 2138   HGBUR SMALL (A) 01/24/2019 2138   BILIRUBINUR SMALL (A) 01/24/2019 2138   KETONESUR NEGATIVE 01/24/2019 2138   PROTEINUR NEGATIVE 01/24/2019 2138   NITRITE NEGATIVE 01/24/2019 2138   LEUKOCYTESUR LARGE (A) 01/24/2019 2138    Sepsis Labs: Lactic Acid, Venous    Component Value Date/Time   LATICACIDVEN 1.8 01/25/2019 0536    MICROBIOLOGY: Recent Results (from the past 240 hour(s))  Blood Culture (routine x 2)     Status: None   Collection Time: 01/24/19  7:15 PM  Result Value Ref Range Status   Specimen Description BLOOD RIGHT ANTECUBITAL  Final   Special Requests   Final    BOTTLES DRAWN AEROBIC AND ANAEROBIC Blood Culture results may not be optimal due to an inadequate volume of blood received in culture bottles   Culture   Final    NO GROWTH 5 DAYS Performed at [facility], [place] [address], [place], [place] [address]    Report Status 01/29/2019 FINAL  Final  Blood Culture (routine x 2)     Status: None   Collection Time: 01/24/19  7:36 PM  Result Value Ref Range Status   Specimen Description BLOOD RIGHT HAND  Final   Special Requests   Final    BOTTLES DRAWN AEROBIC AND  ANAEROBIC Blood Culture results may not be optimal due to an inadequate volume of blood received in culture bottles   Culture   Final    NO GROWTH 5 DAYS Performed at [facility], [place] [address], [place], [place] [address]    Report Status 01/29/2019 FINAL  Final  Aerobic/Anaerobic Culture (surgical/deep wound)     Status: None   Collection Time: 01/25/19  2:57 PM  Result Value Ref Range Status   Specimen Description GALL BLADDER  Final   Special Requests NONE  Final   Gram Stain   Final    ABUNDANT WBC PRESENT, PREDOMINANTLY PMN MODERATE GRAM NEGATIVE RODS RARE GRAM POSITIVE COCCI    Culture   Final    ABUNDANT ENTEROBACTER HORMAECHEI FEW ENTEROCOCCUS FAECALIS NO ANAEROBES ISOLATED Performed at [facility], [place] [address], [place], [place] [address]    Report Status 02/01/2019 FINAL  Final   Organism ID, Bacteria ENTEROBACTER HORMAECHEI  Final   Organism ID, Bacteria ENTEROCOCCUS FAECALIS  Final      Susceptibility   Enterococcus faecalis - MIC*    AMPICILLIN <=2 SENSITIVE Sensitive     VANCOMYCIN 1 SENSITIVE Sensitive     GENTAMICIN SYNERGY SENSITIVE Sensitive     * FEW ENTEROCOCCUS FAECALIS   Enterobacter hormaechei - MIC*    CEFAZOLIN >=64 RESISTANT Resistant     CEFEPIME <=1 SENSITIVE Sensitive     CEFTAZIDIME >=64 RESISTANT Resistant     CEFTRIAXONE >=64 RESISTANT Resistant     CIPROFLOXACIN 0.5 SENSITIVE Sensitive     GENTAMICIN <=1 SENSITIVE Sensitive     IMIPENEM 0.5 SENSITIVE Sensitive     TRIMETH/SULFA <=20 SENSITIVE Sensitive     PIP/TAZO >=128 RESISTANT Resistant     * ABUNDANT ENTEROBACTER HORMAECHEI    RADIOLOGY STUDIES/RESULTS: Ct Head Wo Contrast  Result Date: 02/02/2019 CLINICAL DATA:  85 year old who underwent sphincterotomy and bile duct stone removal on 01/29/2019, current history of sepsis secondary  to cholecystitis and cholangitis with an indwelling cholecystostomy tube, now with acute mental status changes, possibly related to  metabolic encephalopathy. EXAM: CT HEAD WITHOUT CONTRAST TECHNIQUE: Contiguous axial images were obtained from the base of the skull through the vertex without intravenous contrast. COMPARISON:  12/12/2018 and earlier. FINDINGS: Brain: Patient's head is turned to the LEFT. Severe cortical, deep and cerebellar atrophy, unchanged since December, 2019, but progressive since 2014. Moderate to severe changes of small vessel as severe changes of small vessel disease of the white matter diffusely, unchanged. Physiologic calcifications in the basal ganglia. No mass lesion. No midline shift. No acute hemorrhage or hematoma. No extra-axial fluid collections. No evidence of acute infarction. Vascular: Severe BILATERAL carotid siphon and vertebral artery atherosclerosis. Severe atherosclerosis involving MIDDLE cerebral arteries. 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 No hyperdense vessel. Skull: No skull fracture or other focal osseous abnormality involving the skull. Sinuses/Orbits: Visualized paranasal sinuses, bilateral mastoid air cells and bilateral middle ear cavities well-aerated. Benign calcified senile plaques in both eyes. Other: None. IMPRESSION: 1. No acute intracranial abnormality. 2. Severe generalized atrophy and moderate to severe chronic microvascular ischemic changes of the white matter. 3. 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 Electronically Signed   By: Evangeline Dakin M.D.   On: 02/02/2019 14:19   Mr Brain Wo Contrast  Result Date: 02/02/2019 CLINICAL DATA:  Altered mental status EXAM: MRI HEAD WITHOUT CONTRAST TECHNIQUE: Multiplanar, multiecho pulse sequences of the brain and surrounding structures were obtained without intravenous contrast. COMPARISON:  CT head 02/02/2019 FINDINGS: Brain: Image quality degraded by significant motion. Moderate to advanced atrophy.  Ventricular enlargement consistent with atrophy. Negative for acute infarct. Moderate chronic microvascular ischemic changes in the white matter. Small chronic infarcts in the right cerebellum. Negative for hemorrhage or mass. No midline shift or fluid collection Vascular: Normal arterial flow voids. Atherosclerotic ectasia of the basilar artery as noted previously. Skull and upper cervical spine: Negative Sinuses/Orbits: Paranasal sinuses clear.  Bilateral cataract surgery Other: None IMPRESSION: Atrophy and chronic ischemic change. No acute intracranial abnormality Image quality degraded by motion. Electronically Signed   By: Franchot Gallo M.D.   On: 02/02/2019 15:33   Ir Sinus/fist Tube Chk-non Gi  Result Date: 01/28/2019 INDICATION: 85 year old with cholecystostomy tube that was recently replaced. The retention suture has been pulled out of the skin. EXAM: DRAIN INJECTION WITH FLUOROSCOPY COMPARISON:  None. MEDICATIONS: None ANESTHESIA/SEDATION: None COMPLICATIONS: None immediate. TECHNIQUE: Drain was injected with contrast under fluoroscopy. 10 mL Omnipaque 300 was injected. PROCEDURE: The drain and surrounding skin was prepped with chlorhexidine. A new 0-Prolene suture was used to secure the catheter to the skin. FINDINGS: Cholecystostomy tube remains within the gallbladder. Again noted are multiple filling defects compatible with gallstones. IMPRESSION: Cholecystostomy tube remains in the gallbladder. Drain was secured to skin with new suture. Electronically Signed   By: Markus Daft M.D.   On: 01/28/2019 17:47   Mr 3d Recon At Scanner  Result Date: 01/28/2019 CLINICAL DATA:  85 year old male with history of acute cholecystitis in February 8182, complicated by sepsis and respiratory failure. Status post cholecystostomy drainage. Readmission for lethargy, fever, nausea and leukocytosis with newly increased liver function tests. EXAM: MRI ABDOMEN WITHOUT AND WITH CONTRAST (INCLUDING MRCP) TECHNIQUE:  Multiplanar multisequence MR imaging of the abdomen was performed both before and after the administration of intravenous contrast. Heavily T2-weighted images of the biliary and  pancreatic ducts were obtained, and three-dimensional MRCP images were rendered by post processing. CONTRAST:  9 mL of Gadavist. COMPARISON:  No prior abdominal MRI. CT the abdomen and pelvis 11/21/2018. FINDINGS: Comment: Today's study is limited by considerable patient motion. Lower chest: Small bilateral pleural effusions lying dependently. Signal void in the region of the aortic root related to prior TAVR procedure. Hepatobiliary: No discrete cystic or solid hepatic lesions are confidently identified on today's motion limited examination. MRCP images are largely nondiagnostic. However, other T2 weighted sequences demonstrate no definite intrahepatic biliary ductal dilatation. A percutaneous cholecystostomy tube is noted. Multiple filling defects within the gallbladder are compatible with multiple gallstones. Gallbladder does not appear overly distended, and there is no large volume of pericholecystic fluid noted at this time. A trace amount of irregular T2 signal intensity is noted adjacent to the gallbladder, likely within normal limits given the indwelling percutaneous cholecystostomy tube. Innumerable filling defects are also noted within the common bile duct, compatible with choledocholithiasis. Common bile duct appears to measure up to 13 mm distally. Pancreas: Pancreatic atrophy. Multiple well-defined T1 hypointense, T2 hyperintense, nonenhancing lesions are noted throughout the body of the pancreas, measuring up to 1.4 cm (axial image 25 of series 4). No pancreatic ductal dilatation. No peripancreatic fluid collections or inflammatory changes. Spleen:  Unremarkable. Adrenals/Urinary Tract: There are multiple T1 hypointense, T2 hyperintense, nonenhancing lesions in the kidneys bilaterally, compatible with simple cysts, largest of  which is exophytic in the posterior aspect of the upper pole of the right kidney measuring 5 cm. No hydroureteronephrosis in the visualized portions of the abdomen. Bilateral adrenal glands are normal in appearance. Stomach/Bowel: Visualized portions are unremarkable. Vascular/Lymphatic: Aortic atherosclerosis with fusiform aneurysmal dilatation of the infrarenal abdominal aorta which measures up to 4.2 x 5.2 cm. No lymphadenopathy identified in the abdomen. Other: No significant volume of ascites noted in the visualized portions of the peritoneal cavity. Musculoskeletal: No aggressive appearing lytic or blastic lesions noted in the visualized portions of the skeleton. IMPRESSION: 1. Choledocholithiasis with dilated common bile duct measuring up to 13 mm in the porta hepatis. Despite the presence of numerous stones in this mildly dilated duct, there is no intrahepatic biliary ductal dilatation noted at this time. 2. Cholelithiasis with percutaneous cholecystostomy tube in place. Minimal surrounding T2 hyperintensity adjacent to the gallbladder which is likely within normal limits for a patient with an indwelling percutaneous cholecystostomy tube. 3. Aortic atherosclerosis with fusiform dilatation of the infrarenal abdominal aorta. 4. Small bilateral pleural effusions lying dependently. Electronically Signed   By: Vinnie Langton M.D.   On: 01/28/2019 08:04   Dg Chest Port 1 View  Result Date: 01/24/2019 CLINICAL DATA:  Tachypnea and possible sepsis EXAM: PORTABLE CHEST 1 VIEW COMPARISON:  12/12/2018 FINDINGS: Cardiac shadow is enlarged. Changes of prior TAVR are again seen. Aortic calcifications are noted. The lungs are well aerated bilaterally. Mild diffuse interstitial changes are noted similar to that seen on the prior exam. Patchy atelectasis/early infiltrate is noted in the left base. No bony abnormality is seen. IMPRESSION: Patchy left basilar atelectasis/early infiltrate. Electronically Signed   By: Inez Catalina M.D.   On: 01/24/2019 20:10   Dg Ercp Biliary & Pancreatic Ducts  Result Date: 01/29/2019 CLINICAL DATA:  85 year old male with choledocholithiasis undergoing ERCP. EXAM: ERCP TECHNIQUE: Multiple spot images obtained with the fluoroscopic device and submitted for interpretation post-procedure. FLUOROSCOPY TIME:  Fluoroscopy Time:  3 minutes 14 seconds. COMPARISON:  MRCP 01/27/2019. FINDINGS: A total of 5 intraoperative spot  views are submitted for review. The images demonstrate a flexible endoscope in the descending duodenum followed by wire cannulation of the common bile duct and cholangiogram. Multiple filling defects are present throughout the common bile duct consistent with choledocholithiasis. Subsequent images demonstrate sphincterotomy and balloon sweeping of the common duct. A pigtail drainage catheter is also evident in the right upper quadrant consistent with the recently placed percutaneous cholecystostomy tube. IMPRESSION: ERCP with sphincterotomy and balloon sweep of the common duct. These images were submitted for radiologic interpretation only. Please see the procedural report for the amount of contrast and the fluoroscopy time utilized. Electronically Signed   By: Jacqulynn Cadet M.D.   On: 01/29/2019 11:27   Mr Abdomen Mrcp Moise Boring Contast  Result Date: 01/28/2019 CLINICAL DATA:  85 year old male with history of acute cholecystitis in February 5361, complicated by sepsis and respiratory failure. Status post cholecystostomy drainage. Readmission for lethargy, fever, nausea and leukocytosis with newly increased liver function tests. EXAM: MRI ABDOMEN WITHOUT AND WITH CONTRAST (INCLUDING MRCP) TECHNIQUE: Multiplanar multisequence MR imaging of the abdomen was performed both before and after the administration of intravenous contrast. Heavily T2-weighted images of the biliary and pancreatic ducts were obtained, and three-dimensional MRCP images were rendered by post processing. CONTRAST:   9 mL of Gadavist. COMPARISON:  No prior abdominal MRI. CT the abdomen and pelvis 11/21/2018. FINDINGS: Comment: Today's study is limited by considerable patient motion. Lower chest: Small bilateral pleural effusions lying dependently. Signal void in the region of the aortic root related to prior TAVR procedure. Hepatobiliary: No discrete cystic or solid hepatic lesions are confidently identified on today's motion limited examination. MRCP images are largely nondiagnostic. However, other T2 weighted sequences demonstrate no definite intrahepatic biliary ductal dilatation. A percutaneous cholecystostomy tube is noted. Multiple filling defects within the gallbladder are compatible with multiple gallstones. Gallbladder does not appear overly distended, and there is no large volume of pericholecystic fluid noted at this time. A trace amount of irregular T2 signal intensity is noted adjacent to the gallbladder, likely within normal limits given the indwelling percutaneous cholecystostomy tube. Innumerable filling defects are also noted within the common bile duct, compatible with choledocholithiasis. Common bile duct appears to measure up to 13 mm distally. Pancreas: Pancreatic atrophy. Multiple well-defined T1 hypointense, T2 hyperintense, nonenhancing lesions are noted throughout the body of the pancreas, measuring up to 1.4 cm (axial image 25 of series 4). No pancreatic ductal dilatation. No peripancreatic fluid collections or inflammatory changes. Spleen:  Unremarkable. Adrenals/Urinary Tract: There are multiple T1 hypointense, T2 hyperintense, nonenhancing lesions in the kidneys bilaterally, compatible with simple cysts, largest of which is exophytic in the posterior aspect of the upper pole of the right kidney measuring 5 cm. No hydroureteronephrosis in the visualized portions of the abdomen. Bilateral adrenal glands are normal in appearance. Stomach/Bowel: Visualized portions are unremarkable. Vascular/Lymphatic:  Aortic atherosclerosis with fusiform aneurysmal dilatation of the infrarenal abdominal aorta which measures up to 4.2 x 5.2 cm. No lymphadenopathy identified in the abdomen. Other: No significant volume of ascites noted in the visualized portions of the peritoneal cavity. Musculoskeletal: No aggressive appearing lytic or blastic lesions noted in the visualized portions of the skeleton. IMPRESSION: 1. Choledocholithiasis with dilated common bile duct measuring up to 13 mm in the porta hepatis. Despite the presence of numerous stones in this mildly dilated duct, there is no intrahepatic biliary ductal dilatation noted at this time. 2. Cholelithiasis with percutaneous cholecystostomy tube in place. Minimal surrounding T2 hyperintensity adjacent to the gallbladder  which is likely within normal limits for a patient with an indwelling percutaneous cholecystostomy tube. 3. Aortic atherosclerosis with fusiform dilatation of the infrarenal abdominal aorta. 4. Small bilateral pleural effusions lying dependently. Electronically Signed   By: Vinnie Langton M.D.   On: 01/28/2019 08:04   Ir Exchange Biliary Drain  Result Date: 01/25/2019 INDICATION: 85 year old with biliary sepsis and history of a cholecystostomy tube. EXAM: CHOLECYSTOSTOMY TUBE EXCHANGE WITH FLUOROSCOPY CHOLANGIOGRAM THROUGH EXISTING CATHETER MEDICATIONS: None ANESTHESIA/SEDATION: None FLUOROSCOPY TIME:  Fluoroscopy Time: 1 minutes, 12 seconds, 9 mGy COMPLICATIONS: None immediate. PROCEDURE: Informed written consent was obtained from the patient after a thorough discussion of the procedural risks, benefits and alternatives. All questions were addressed. Maximal Sterile Barrier Technique was utilized including caps, mask, sterile gowns, sterile gloves, sterile drape, hand hygiene and skin antiseptic. A timeout was performed prior to the initiation of the procedure. The existing gallbladder drainage catheter was prepped and draped in a sterile fashion. The  retention suture was still intact. It was difficult to flush the catheter and the tube appeared to be partially obstructed. Purulent looking fluid was draining around the catheter during injection of the tube. The tube was found to be within the gallbladder. The retention suture was removed. The catheter was cut. The catheter was removed over a stiff Amplatz wire. A new 12 [place] multipurpose drain was easily advanced over the wire and reconstituted in the gallbladder. Approximately 40 mL of light brown purulent looking fluid was removed from the gallbladder. Small amount of contrast was injected through the gallbladder drain. Drain was flushed with normal saline. Skin was anesthetized with 1% lidocaine and sutured to skin with Prolene suture. FINDINGS: Old catheter was well positioned in the gallbladder but the tube appeared to be partially obstructed. Greater than 40 mL of brown purulent looking fluid was removed from the gallbladder after the drain exchange. Fluid was sent for culture. Cholangiogram demonstrated partial filling of the cystic duct but no filling of the common bile duct. Innumerable gallstones. IMPRESSION: 1. Successful exchange of the cholecystostomy tube. 2. Innumerable gallstones. Unable to opacify the common bile duct due to obstruction of the cystic duct. 3. Purulent looking fluid removed from the gallbladder. Fluid sent for culture. Electronically Signed   By: Markus Daft M.D.   On: 01/25/2019 17:11   Ir Exchange Biliary Drain  Result Date: 01/05/2019 INDICATION: History of acute cholecystitis, poor operative candidate, post image guided cholecystostomy tube placement on 11/22/2018. Patient recently returned for fluoroscopic guided exchange and repositioning on 12/18/2018 secondary to inverted retraction of the existing cholecystostomy tube and presents today for the same reason. EXAM: FLUOROSCOPIC GUIDED CHOLECYSTOSTOMY TUBE EXCHANGE AND UP SIZING COMPARISON:  Image guided cholecystostomy  tube placement-11/22/2018; fluoroscopic guided cholecystostomy tube exchange replacement-12/18/2018 MEDICATIONS: None ANESTHESIA/SEDATION: None CONTRAST:  54mL OMNIPAQUE IOHEXOL 300 MG/ML SOLN - administered into the gallbladder fossa. FLUOROSCOPY TIME:  3 minutes, 42 seconds (443 mGy) COMPLICATIONS: None immediate. PROCEDURE: Informed written consent was obtained from the patient's wife after a discussion of the risks, benefits and alternatives to treatment. Questions regarding the procedure were encouraged and answered. A timeout was performed prior to the initiation of the procedure. The external portion of the existing cholecystostomy tube as well as the surrounding skin was prepped and draped in usual sterile fashion. Preprocedural spot fluoroscopic image was obtained of the right upper abdominal quadrant demonstrating retraction of the cholecystostomy tube with end coiled and locked peripheral to the expected location of the gallbladder lumen. Contrast was injected via the  existing cholecystostomy tube however nearly all the injected contrast reflux along the cholecystostomy track to the skin entrance site. As such, the external portion of the existing malposition cholecystostomy tube was cut and the tube was removed intact. The entrance site of the prior cholecystostomy tube was cannulated with a Christmas tree adapter and contrast injection was performed demonstrating patency of the cholecystostomy track with opacification of the gallbladder lumen. With the use of a regular glidewire, a Kumpe catheter was advanced through the serpiginous cholecystostomy track to the level of the gallbladder lumen. Contrast injection confirmed appropriate positioning. Next, over a short Amplatz wire, the existing Kumpe catheter was exchanged for a new slightly larger, now 44 [place], cholecystostomy tube with end ultimately coiled and locked within the gallbladder lumen. Limited contrast injection confirmed appropriate  position functionality of the new slightly larger cholecystostomy tube. The external portion of the cholecystostomy tube was secured at the skin entrance site within interrupted suture and a Stat Lock device. Cholecystostomy tube was reconnected to a gravity bag. A dressing was placed. The patient tolerated the procedure well without immediate postprocedural complication. IMPRESSION: Successful fluoroscopic guided replacement and up sizing of now 38 French cholecystostomy tube. Electronically Signed   By: Sandi Mariscal M.D.   On: 01/05/2019 15:02   US Abdomen Limited Ruq  Result Date: 01/24/2019 CLINICAL DATA:  85 y/o  M; transaminitis. EXAM: ULTRASOUND ABDOMEN LIMITED RIGHT UPPER QUADRANT COMPARISON:  11/21/2018 CT abdomen and pelvis and abdominal ultrasound. 11/22/2018, 12/18/2018, 01/05/2019 interventional radiology cholecystostomy and cholecystostomy exchanges. FINDINGS: Gallbladder: Cholelithiasis with the largest stone measuring 9 mm near the gallbladder neck. Gallbladder wall thickening to 4.9 mm. Negative sonographic Murphy's sign. No pericholecystic fluid. Linear echogenic structure likely representing the cholecystostomy tube is partially visualized within gallbladder fossa, limited evaluation due to overlying bandaging. Common bile duct: Diameter: 7.3 mm Liver: No focal lesion identified. Within normal limits in parenchymal echogenicity. Portal vein is patent on color Doppler imaging with normal direction of blood flow towards the liver. IMPRESSION: 1. Exam limited due to overlying bandaging in the right upper quadrant. 2. Mild gallbladder wall thickening, probably chronic sequelae of cholecystitis and cholecystostomy tube. Negative sonographic Murphy's sign. Cholelithiasis again noted. 3. No significant intra or extrahepatic biliary ductal dilatation identified. Electronically Signed   By: Kristine Garbe M.D.   On: 01/24/2019 22:25     LOS: 10 days   Oren Binet, MD  [facility]  If 7PM-7AM, please contact night-coverage  Please page via [email or web address]  Go to [email or web address] and use [facility] universal password to access. If you do not have the password, please contact the hospital operator.  Locate the [facility] provider you are looking for under [facility] and page to a number that you can be directly reached. If you still have difficulty reaching the provider, please page the [facility] (Director on Call) for the Hospitalists listed on amion for assistance.  02/03/2019, 7:58 AM

## 2019-02-03 NOTE — Progress Notes (Signed)
Pts wife in room when RT entered and asked about CPAP dream station for the pt. Wife states he does not wear at home and has not worn it for over 10 years. RT will continue to monitor.

## 2019-02-03 NOTE — Progress Notes (Signed)
Patient ID: William [name], male   DOB: [birth year], 85 y.o.   MRN: [id number]  This NP visited patient at the bedside as a follow up for palliative medicine needs and emotional support.    I introduced myself and re-introduced the role of palliative medicine into a holistic treatment plan.   Family has spoken to palliative medicine providers throughout this hospitalization.  Patient appears comfortable and is resting quietly as I speak with Mrs Penny in the room. Created space and opportunity for her/wife to explore her thoughts and feeling regarding her husbands current medical situation.   We discussed concepts specific to human mortality and how the body weakens and fails to thrive at end of life.  We discussed the limitations of medical interventions to prolong quality of life when a body does begin to fail to thrive.  Initially Mrs Callicot was defensive but quickly softened and opened up espressing her love for her husband and sharing parts  of her family history and marriage.  We were able to uncover that indeed Mr Galicia does have AD documents completed in the past;  "I have no idea where they are".  She verbalizes that he would not want to be resuscitated in event of cardiac arrest.    I will document a DNR/DNI /bedside RN present for conversation..  Wife plans to look for paperwork.  I believe wife understands the poor prognosis but continues to hope for improvement. We discussed the courage it takes to accept difficult unwanted situations that are out of our control.    Emotional support offered.  Discussed with wife the importance of continued conversation with her  family and the medical providers regarding overall plan of care and treatment options,  ensuring decisions are within the context of the patients best interest, values and GOCs.  Plan is to meet tomorrow and conference call daughter in.   Questions and concerns addressed   Discussed with Dr Rory Percy and Dr  Estell Harpin  Total time spent on the unit was 75 minutes  Greater than 50% of the time was spent in counseling and coordination of care  Wadie Lessen NP  Palliative Medicine Team Team Phone # [phone] Pager [phone]

## 2019-02-03 NOTE — Progress Notes (Signed)
Neurology Progress Note   S:// More awake today.  Complaining of left arm pain as evidenced by grimacing and yelling in pain when left arm is moved.  Specifically there left wrist but at times randomly yelling without anybody having touched him. Wife did report that he is more awake.  At times looked at her and when she asked him to smile he did smile for her. Had a detailed conversation with his daughter Ms. Ebony Hail over the phone to relay the MRI, CT and the EEG results.    O:// Current vital signs: BP 124/67   Pulse (!) 104   Temp 98.8 F (37.1 C) (Axillary)   Resp 17   Ht 5\' 10"  (1.778 m)   Wt 89.8 kg   SpO2 96%   BMI 28.41 kg/m  Vital signs in last 24 hours: Temp:  [98.2 F (36.8 C)-98.8 F (37.1 C)] 98.8 F (37.1 C) (04/22 3614) Pulse Rate:  [50-108] 104 (04/22 1400) Resp:  [0-27] 17 (04/22 1400) BP: (100-125)/(54-84) 124/67 (04/22 1338) SpO2:  [94 %-98 %] 96 % (04/22 1400) General: Comfortably sleeping in bed, in no distress HEENT: Normocephalic atraumatic dry mucous membranes Neurological exam Patient is sleeping, opens eyes to voice, he kept staring at me for a while before starting to look away from me.  He initially did not follow commands but upon asking him to stick his tongue out he kept opening his mouth without actually sticking his tongue out.  And his wife attempted to have him smile and requested him to smile, he just kept staring at her and did not follow the commands. Seems like he has extremely poor attention concentration I did not see him speak any discernible words but he did mumble when his limbs were passively moved and screamed upon moving the left arm. Cranial nerves: No extraocular movement restriction, tries to keep his eyes tightly closed when examining difficult examined pupils.  Facial symmetry is preserved. Motor exam: He is full strength in his left arm but yells in pain upon touching or passively moving it.  He also was able to move his right  arm antigravity.  He did not move his legs antigravity. Sensory exam is difficult to perform on him and so is coordination and gait.  Medications  Current Facility-Administered Medications:  .  0.9 %  sodium chloride infusion, 250 mL, Intravenous, PRN, Milus Banister, MD, Last Rate: 10 mL/hr at 01/30/19 1736, 250 mL at 01/30/19 1736 .  0.9 %  sodium chloride infusion, , Intravenous, Continuous, Ghimire, Henreitta Leber, MD .  acetaminophen (TYLENOL) tablet 650 mg, 650 mg, Oral, Q6H PRN, 650 mg at 01/28/19 1828 **OR** acetaminophen (TYLENOL) suppository 650 mg, 650 mg, Rectal, Q6H PRN, Milus Banister, MD .  amiodarone (PACERONE) tablet 100 mg, 100 mg, Oral, Daily, Milus Banister, MD, 100 mg at 02/02/19 4315 .  carvedilol (COREG) tablet 3.125 mg, 3.125 mg, Oral, BID WC, Josue Hector, MD, 3.125 mg at 02/02/19 0905 .  heparin injection 5,000 Units, 5,000 Units, Subcutaneous, Q8H, Thurnell Lose, MD, 5,000 Units at 02/03/19 1354 .  insulin aspart (novoLOG) injection 0-9 Units, 0-9 Units, Subcutaneous, Q4H, Milus Banister, MD, 1 Units at 02/03/19 1238 .  iohexol (OMNIPAQUE) 300 MG/ML solution 10 mL, 10 mL, Intravenous, Once PRN, Milus Banister, MD .  iohexol (OMNIPAQUE) 300 MG/ML solution 10 mL, 10 mL, Intravenous, Once PRN, Milus Banister, MD .  lidocaine (PF) (XYLOCAINE) 1 % injection, , , PRN, Markus Daft,  MD, 5 mL at 01/25/19 1450 .  MEDLINE mouth rinse, 15 mL, Mouth Rinse, BID, Thurnell Lose, MD, 15 mL at 02/02/19 0910 .  meropenem (MERREM) 1 g in sodium chloride 0.9 % 100 mL IVPB, 1 g, Intravenous, Q8H, Powers, Evern Core, MD, Last Rate: 200 mL/hr at 02/03/19 1355, 1 g at 02/03/19 1355 .  ondansetron (ZOFRAN) tablet 4 mg, 4 mg, Oral, Q6H PRN **OR** ondansetron (ZOFRAN) injection 4 mg, 4 mg, Intravenous, Q6H PRN, Milus Banister, MD, 4 mg at 01/29/19 0945 .  pantoprazole (PROTONIX) EC tablet 40 mg, 40 mg, Oral, QHS, Ghimire, Shanker M, MD .  sodium chloride flush (NS) 0.9 % injection  10-40 mL, 10-40 mL, Intracatheter, PRN, Milus Banister, MD, 10 mL at 02/02/19 0823 .  sodium chloride flush (NS) 0.9 % injection 3 mL, 3 mL, Intravenous, Q12H, Milus Banister, MD, 3 mL at 02/01/19 2157 .  sodium chloride flush (NS) 0.9 % injection 3 mL, 3 mL, Intravenous, Q12H, Milus Banister, MD, 3 mL at 01/31/19 2313 .  sodium chloride flush (NS) 0.9 % injection 3 mL, 3 mL, Intravenous, PRN, Milus Banister, MD, 3 mL at 01/25/19 0013 .  sodium chloride flush (NS) 0.9 % injection 5 mL, 5 mL, Intracatheter, Q8H, Milus Banister, MD, 5 mL at 02/03/19 0645 Labs CBC    Component Value Date/Time   WBC 10.4 02/03/2019 0349   RBC 4.10 (L) 02/03/2019 0349   HGB 12.7 (L) 02/03/2019 0349   HCT 36.1 (L) 02/03/2019 0349   PLT 163 02/03/2019 0349   MCV 88.0 02/03/2019 0349   MCH 31.0 02/03/2019 0349   MCHC 35.2 02/03/2019 0349   RDW 15.7 (H) 02/03/2019 0349   LYMPHSABS 0.6 (L) 01/25/2019 0305   MONOABS 0.9 01/25/2019 0305   EOSABS 0.0 01/25/2019 0305   BASOSABS 0.0 01/25/2019 0305    CMP     Component Value Date/Time   NA 131 (L) 02/03/2019 0349   NA 132 (A) 01/10/2019   K 3.9 02/03/2019 0349   CL 102 02/03/2019 0349   CO2 20 (L) 02/03/2019 0349   GLUCOSE 176 (H) 02/03/2019 0349   BUN 12 02/03/2019 0349   BUN 26 (A) 01/10/2019   CREATININE 1.03 02/03/2019 0349   CREATININE 1.12 (H) 02/05/2017 1103   CALCIUM 8.7 (L) 02/03/2019 0349   PROT 6.9 02/03/2019 0349   ALBUMIN 2.5 (L) 02/03/2019 0349   AST 17 02/03/2019 0349   ALT 22 02/03/2019 0349   ALKPHOS 183 (H) 02/03/2019 0349   BILITOT 1.3 (H) 02/03/2019 0349   GFRNONAA >60 02/03/2019 0349   GFRAA >60 02/03/2019 0349  Ammonia 20 TSH 1.596 Vitamin B12-395  Imaging I have reviewed images in epic and the results pertinent to this consultation are: CT head with chronic white matter disease and atrophy.  MRI with chronic white matter disease and atrophy. EEG with no evidence of focal abnormality- discontinuous background  slowing which is seen in diffuse metabolic encephalopathy's.  Assessment: 85 year old man with history of dementia, aortic stenosis status post TAVR, pulmonary nodules, chronic systolic heart failure with a history of VT and AICD placement-AICD had to be removed due to infection, cholecystitis and cholangitis with biliary drain placement with multiple admissions for complications associated with that, admitted from SNF for vomiting, lethargy-underwent ERCP on 01/29/2019 and has been more lethargic than usual. From the history taking obtained from the family, he has been having a gradual decline with every hospitalization and every single acute event that  he has which goes in favor of gradual cognitive decline related to his dementia. His wife is at his bedside and has been given access to come to the hospital (currently we are in the midst of the coronavirus pandemic and families are not permitted to the hospital for social distancing) and was at bedside.  His wife also had their daughter Ebony Hail on the speaker phone, with whom I had a detailed discussion about Mr. Kopp's current diagnosis being encephalopathy-multifactorial especially in the setting of existing cognitive derangements. She had multiple questions about having more familiar faces around, sitters etc. which have deferred to the palliative care team-the palliative care provider was in the room at the time of this encounter and will be documenting their encounter as well.  From a neurological standpoint, reversible causes of altered mental status/encephalopathy have been worked up and the only things that can be supplemented/reversed are to keep the vitamin B12 levels at a slightly higher level although they are within range-they are in low normal range.  His hospital delirium can be expected to get better with return back to familiar surroundings unfamiliar people but that has been a challenge with the current coronavirus pandemic-I will  defer strategies for optimizing that per the primary team and palliative care team.  Impression Multifactorial toxic metabolic encephalopathy Progression of cognitive derangement/dementia in the setting of acute illness  Recommendations: Obtain left shoulder, elbow and wrist x-rays because of tenderness on examination. Replete B12 Try low-dose Seroquel at night if QTC permits and if okay with primary team and palliative care team. I also had a detailed discussion with general surgery attending regarding his case and they continue to be of the opinion that the risk-benefit ratio does not go in his favor for surgery. Cardiology was also deemed him a poor candidate for surgery. I did discuss this also in detail with the family's.  Neurology will be available as needed.  Please call with questions Appreciate palliative care team's continuing consultation.  -- Amie Portland, MD [facility] Pager: [phone] If 7pm to 7am, please call on call as listed on AMION.

## 2019-02-04 ENCOUNTER — Encounter (HOSPITAL_COMMUNITY): Payer: Self-pay | Admitting: Gastroenterology

## 2019-02-04 DIAGNOSIS — R627 Adult failure to thrive: Secondary | ICD-10-CM

## 2019-02-04 DIAGNOSIS — Z66 Do not resuscitate: Secondary | ICD-10-CM

## 2019-02-04 LAB — GLUCOSE, CAPILLARY
Glucose-Capillary: 113 mg/dL — ABNORMAL HIGH (ref 70–99)
Glucose-Capillary: 119 mg/dL — ABNORMAL HIGH (ref 70–99)
Glucose-Capillary: 127 mg/dL — ABNORMAL HIGH (ref 70–99)
Glucose-Capillary: 159 mg/dL — ABNORMAL HIGH (ref 70–99)
Glucose-Capillary: 173 mg/dL — ABNORMAL HIGH (ref 70–99)
Glucose-Capillary: 179 mg/dL — ABNORMAL HIGH (ref 70–99)

## 2019-02-04 LAB — COMPREHENSIVE METABOLIC PANEL
ALT: 17 U/L (ref 0–44)
AST: 17 U/L (ref 15–41)
Albumin: 2.2 g/dL — ABNORMAL LOW (ref 3.5–5.0)
Alkaline Phosphatase: 139 U/L — ABNORMAL HIGH (ref 38–126)
Anion gap: 9 (ref 5–15)
BUN: 14 mg/dL (ref 8–23)
CO2: 22 mmol/L (ref 22–32)
Calcium: 8.3 mg/dL — ABNORMAL LOW (ref 8.9–10.3)
Chloride: 104 mmol/L (ref 98–111)
Creatinine, Ser: 1.02 mg/dL (ref 0.61–1.24)
GFR calc Af Amer: >60 mL/min (ref >60)
GFR calc non Af Amer: >60 mL/min (ref >60)
Glucose, Bld: 132 mg/dL — ABNORMAL HIGH (ref 70–99)
Potassium: 3.4 mmol/L — ABNORMAL LOW (ref 3.5–5.1)
Sodium: 135 mmol/L (ref 135–145)
Total Bilirubin: 1.1 mg/dL (ref 0.3–1.2)
Total Protein: 6.3 g/dL — ABNORMAL LOW (ref 6.5–8.1)

## 2019-02-04 NOTE — Progress Notes (Signed)
Patient ID: William [name], male   DOB: [birth year], 85 y.o.   MRN: [id number]  This NP spoke with attending/Dr. Fayrene Helper regarding this patient.  He  had detailed conversation with the family and is requesting infectious disease input  at this time to assist family in planning a viable  treatment plan and transitions of care.  Family is asking to put off any further conversation with palliative medicine "for now" until they have more information from ID.  I let daughter Ebony Hail know that I would follow-up with them again next week when I am back to work.  However if they have any questions or concerns they can call the team phone and a palliative medicine provider will be happy to be of assistance.  No charge  Wadie Lessen NP  Palliative Medicine Team Team Phone # [phone] Pager [phone]

## 2019-02-04 NOTE — Progress Notes (Signed)
Patient ID: William [name], male   DOB: [birth year], 85 y.o.   MRN: [id number]   This note is written to clarify the surgical recommendations.  The patient is NOT a candidate for surgery.  His risk of mortality and significant morbidity, as well as worsening cognitive changes and poor functional status, are very high.  Cardiology also agrees that he is high risk.  This has been discussed extensively with the patient's family, but they do not seem to fully grasp the situation.    Surgery is not an option.    This has been discussed with his primary surgeon, Dr. Melvyn Novas. [name], MD, [facility] [phone]  02/04/2019 5:00 PM

## 2019-02-04 NOTE — Progress Notes (Signed)
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   Patient underwent cholecystostomy tube  revision by IR, and also underwent ERCP. Although sepsis pathophysiology has improved-further hospital course complicated by acute encephalopathy.  General surgery, GI, ID and palliative care following. See below for further details  Assessment & Plan:   Principal Problem:   Biliary sepsis Active Problems:   Chronic systolic CHF (congestive heart failure) (HCC)   Ventricular tachycardia (HCC)   Dementia (HCC)   Coronary artery disease involving native coronary artery of native heart without angina pectoris   Hyperglycemia   Acute metabolic encephalopathy   Transaminitis   Dementia without behavioral disturbance [facility])   Preoperative cardiovascular examination   Palliative care by specialist   Choledocholithiasis   Adult failure to thrive   DNR (do not resuscitate)  Severe sepsis secondary to acute cholecystitis, cholangitis:  Sepsisphysiology has improved- s/p percutaneous cholecystostomy tube exchange on 4/13 and ERCP with sphincterotomy on 4/17.    Bile cultures on 4/13 + for enterococcus and Enterobacter ID following-remains on meropenem.   Blood cultures are negative.  Cardiology evaluated him from surgical risk perspective and felt he would be high risk for any procedure  Post  ERCP developed encephalopathy which apparently made family concerned regarding pursuing cholecystectomy Per neuro note 4/22, "risk benefit ratio does not go in favor of his surgery". I discussed case with surgery again today.  Last surgery note 4/21.  From surgery standpoint, pt not candidate for surgery.  See updated note from 4/23. I discussed with Dr. Prince Rome from [place] today as well regarding updated ID recs (recommendation for surgery based on 4/20 surgery note).  I had long discussion with family today and discussed plan of care with several members of the care team (ID, surgery, palliative care).  Discussed based on my review of chart, risks thought to outweigh benefits from surgical perspective.  Per ID notes from 4/17, there was mention of course of abx, but "this [would] not solve [the] problem".  Discussed question is long term plan at this point in time.  Have asked infectious disease to discuss updated recommendations tomorrow.  Acute metabolic encephalopathy:Improved this morning.  He's awake and has his eyes open during our conversation with wife/daughter.  Not speaking much and still with some residual delirium, but per wife/daughter report, sounds like things have improved. Delirium precautions Hold off on seroquel given improvement Continue B12 supplementation MRI brain with atrophy and chronic ischemic change CT head without acute abnormality EEG without epileptiform activity Neurology consulted and felt this was toxic metabolic encephalopathy with progression of cognitive derangement/dementia in setting of acute illness.  See 4/22 note.  History of ventricular tachycardia had AICD in place but this was explanted in December 2019 for ESBL producing Klebsiella lead endocarditis:Monitored in telemetry-continue Coregand amiodarone  History of severe aortic stenosis s/p TAVR in November 2019:TEE on 2/13 with stable bioprosthetic aortic valve in place.    Chronic systolic heart  failure (EF 25-30% by TTE on 11/23/2018):Volume status  appears relatively stable-follow.  CAD: No anginal symptoms-Plavix on hold  Severe deconditioning/debility: Secondary to acute illness-and numerous recent hospitalizations-we will need PT evaluation.  AAA (4.6 x 4 cm infrarenal): Stable for outpatient follow-up.  Multiple small pulmonary nodules throughout the lung seen on CT chest on 08/03/2018:Stable for outpatient follow-up  Left Wrist Pain: plain films of forearm without acute injury, will follow up with rads  DVT prophylaxis: heparin Code Status: DNR Family Communication: wife at bedside/daughter over the phone Disposition Plan: pending   Consultants:   ID  Palliative care  Surgery  Cardiology  GI  Procedures:  4/13 cholecystostomy tube exchange with fluoroscopy 4/16: Drain check and placement of new suture 4/17 ERCP  Antimicrobials:  Anti-infectives (From admission, onward)   Start     Dose/Rate Route Frequency Ordered Stop   02/01/19 2200  ceFEPIme (MAXIPIME) 2 g in sodium chloride 0.9 % 100 mL IVPB  Status:  Discontinued     2 g 200 mL/hr over 30 Minutes Intravenous Every 12 hours 02/01/19 1044 02/01/19 1151   02/01/19 1300  meropenem (MERREM) 1 g in sodium chloride 0.9 % 100 mL IVPB     1 g 200 mL/hr over 30 Minutes Intravenous Every 8 hours 02/01/19 1151     01/28/19 1400  ceFEPIme (MAXIPIME) 2 g in sodium chloride 0.9 % 100 mL IVPB  Status:  Discontinued     2 g 200 mL/hr over 30 Minutes Intravenous Every 8 hours 01/28/19 0830 02/01/19 1044   01/25/19 2200  vancomycin (VANCOCIN) 1,500 mg in sodium chloride 0.9 % 500 mL IVPB  Status:  Discontinued     1,500 mg 250 mL/hr over 120 Minutes Intravenous Every 24 hours 01/24/19 2027 01/26/19 1124   01/25/19 0200  ceFEPIme (MAXIPIME) 2 g in sodium chloride 0.9 % 100 mL IVPB  Status:  Discontinued     2 g 200 mL/hr over 30 Minutes Intravenous Every 12 hours 01/24/19 2211 01/28/19 0830   01/24/19 2200   ceFEPIme (MAXIPIME) 2 g in sodium chloride 0.9 % 100 mL IVPB  Status:  Discontinued     2 g 200 mL/hr over 30 Minutes Intravenous  Once 01/24/19 2158 01/24/19 2208   01/24/19 2200  metroNIDAZOLE (FLAGYL) IVPB 500 mg  Status:  Discontinued     500 mg 100 mL/hr over 60 Minutes Intravenous Every 8 hours 01/24/19 2158 02/01/19 1151   01/24/19 1945  piperacillin-tazobactam (ZOSYN) IVPB 3.375 g     3.375 g 100 mL/hr over 30 Minutes Intravenous  Once 01/24/19 1931 01/24/19 2017   01/24/19 1930  ceFEPIme (MAXIPIME) 2 g in sodium chloride 0.9 % 100 mL IVPB  Status:  Discontinued     2 g 200 mL/hr over 30 Minutes Intravenous  Once 01/24/19 1918 01/24/19 2010   01/24/19 1930  metroNIDAZOLE (FLAGYL) IVPB 500 mg  Status:  Discontinued     500 mg 100 mL/hr over 60 Minutes Intravenous  Once 01/24/19 1918 01/24/19 1931   01/24/19 1930  vancomycin (VANCOCIN) IVPB 1000 mg/200 mL premix  Status:  Discontinued     1,000 mg 200 mL/hr over 60 Minutes Intravenous  Once 01/24/19 1918 01/24/19 1922   01/24/19 1930  vancomycin (VANCOCIN) 2,000 mg in sodium chloride 0.9 % 500 mL IVPB     2,000 mg 250 mL/hr over 120 Minutes Intravenous  Once 01/24/19 1922 01/25/19 0012      Subjective: Wife at bedside Pt more awake, not speaking much Eating better  Objective: Vitals:   02/04/19 0800  02/04/19 1027 02/04/19 1150 02/04/19 1656  BP:  95/79 91/75 95/72   Pulse:   95 98  Resp:   (!) 25 17  Temp: 97.8 F (36.6 C)     TempSrc: Axillary     SpO2:   96% 97%  Weight:      Height:        Intake/Output Summary (Last 24 hours) at 02/04/2019 1711 Last data filed at 02/04/2019 0641 Gross per 24 hour  Intake 10 ml  Output 675 ml  Net -665 ml   Filed Weights   01/31/19 0433 02/02/19 0500 02/04/19 0500  Weight: 92 kg 89.8 kg 91.5 kg    Examination:  General exam: Appears calm and comfortable  Respiratory system: Clear to auscultation. Respiratory effort normal. Cardiovascular system: S1 & S2 heard, RRR.   Gastrointestinal system: Abdomen is nondistended, soft and nontender. Drain in place [employer] system: Alert, but disoriented.  Moving all extremities. Skin: No rashes, lesions or ulcers Psychiatry: Judgement and insight appear normal. Mood & affect appropriate.     Data Reviewed: I have personally reviewed following labs and imaging studies  CBC: Recent Labs  Lab 01/30/19 0346 01/31/19 0240 02/01/19 0404 02/02/19 0503 02/03/19 0349  WBC 6.9 6.9 6.8 7.3 10.4  HGB 11.1* 11.0* 12.2* 12.6* 12.7*  HCT 33.5* 33.4* 37.5* 36.4* 36.1*  MCV 93.1 93.8 90.8 89.4 88.0  PLT 118* 122* 128* 142* 235   Basic Metabolic Panel: Recent Labs  Lab 01/29/19 0340 01/30/19 0346 01/31/19 0240 02/01/19 0404 02/02/19 0503 02/03/19 0349 02/04/19 0350  NA 136 139 138 132* 131* 131* 135  K 3.8 3.9 3.4* 3.8 3.7 3.9 3.4*  CL 105 109 108 102 100 102 104  CO2 26 22 19* 22 20* 20* 22  GLUCOSE 131* 111* 122* 77 152* 176* 132*  BUN 12 15 15 14 12 12 14   CREATININE 0.95 0.92 0.98 0.97 1.01 1.03 1.02  CALCIUM 8.4* 8.4* 8.1* 8.2* 8.5* 8.7* 8.3*  MG 2.0 2.0 2.0 2.0  --   --   --    GFR: Estimated Creatinine Clearance: 55.8 mL/min (by C-G formula based on SCr of 1.02 mg/dL). Liver Function Tests: Recent Labs  Lab 01/31/19 0240 02/01/19 0404 02/02/19 0503 02/03/19 0349 02/04/19 0350  AST 19 18 19 17 17   ALT 34 27 24 22 17   ALKPHOS 222* 209* 190* 183* 139*  BILITOT 1.2 1.2 1.2 1.3* 1.1  PROT 6.0* 6.7 6.7 6.9 6.3*  ALBUMIN 2.3* 2.4* 2.4* 2.5* 2.2*   No results for input(s): LIPASE, AMYLASE in the last 168 hours. Recent Labs  Lab 02/03/19 0349  AMMONIA 20   Coagulation Profile: Recent Labs  Lab 02/01/19 0404  INR 1.2   Cardiac Enzymes: No results for input(s): CKTOTAL, CKMB, CKMBINDEX, TROPONINI in the last 168 hours. BNP (last 3 results) No results for input(s): PROBNP in the last 8760 hours. HbA1C: No results for input(s): HGBA1C in the last 72 hours. CBG: Recent Labs  Lab  02/04/19 0029 02/04/19 0441 02/04/19 0758 02/04/19 1156 02/04/19 1642  GLUCAP 113* 127* 119* 179* 173*   Lipid Profile: No results for input(s): CHOL, HDL, LDLCALC, TRIG, CHOLHDL, LDLDIRECT in the last 72 hours. Thyroid Function Tests: Recent Labs    02/03/19 0349  TSH 1.596   Anemia Panel: Recent Labs    02/03/19 0349  VITAMINB12 395   Sepsis Labs: No results for input(s): PROCALCITON, LATICACIDVEN in the last 168 hours.  No results found for this or any previous visit (  from the past 240 hour(s)).       Radiology Studies: Dg Forearm Left  Result Date: 02/03/2019 CLINICAL DATA:  Left arm pain. EXAM: LEFT FOREARM - 2 VIEW COMPARISON:  None. FINDINGS: There is no evidence of fracture or other focal bone lesions. Vascular calcifications are noted. IMPRESSION: No significant abnormality seen in the left forearm. Electronically Signed   By: Marijo Conception M.D.   On: 02/03/2019 15:46   Dg Shoulder Left  Result Date: 02/03/2019 CLINICAL DATA:  Left shoulder pain. EXAM: LEFT SHOULDER - 2+ VIEW COMPARISON:  Radiographs of October 10, 2018. FINDINGS: There is no evidence of fracture or dislocation. Moderate degenerative changes seen involving the left glenohumeral joint. Soft tissues are unremarkable. 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 No acute abnormality seen in the left shoulder. Electronically Signed   By: Marijo Conception M.D.   On: 02/03/2019 15:50        Scheduled Meds: . amiodarone  100 mg Oral Daily  . carvedilol  3.125 mg Oral BID WC  . heparin injection (subcutaneous)  5,000 Units Subcutaneous Q8H  . insulin aspart  0-9 Units Subcutaneous Q4H  . mouth rinse  15 mL Mouth Rinse BID  . midodrine  2.5 mg Oral TID WC  . pantoprazole  40 mg Oral QHS  . sodium chloride flush  3 mL Intravenous Q12H  . sodium chloride flush  5 mL Intracatheter Q8H  . vitamin B-12  250 mcg Oral Daily   Continuous Infusions: . sodium chloride  250 mL (01/30/19 1736)  . sodium chloride 40 mL/hr at 02/04/19 0519  . meropenem (MERREM) IV 1 g (02/04/19 1432)     LOS: 11 days    Time spent: over 30 min    Fayrene Helper, MD [facility] Pager AMION  If 7PM-7AM, please contact night-coverage [email or web address] Password [email or web address] 02/04/2019, 5:11 PM

## 2019-02-05 ENCOUNTER — Inpatient Hospital Stay (HOSPITAL_COMMUNITY): Payer: Medicare Other

## 2019-02-05 DIAGNOSIS — I2510 Atherosclerotic heart disease of native coronary artery without angina pectoris: Secondary | ICD-10-CM

## 2019-02-05 DIAGNOSIS — I714 Abdominal aortic aneurysm, without rupture: Secondary | ICD-10-CM

## 2019-02-05 DIAGNOSIS — R011 Cardiac murmur, unspecified: Secondary | ICD-10-CM

## 2019-02-05 DIAGNOSIS — K8080 Other cholelithiasis without obstruction: Secondary | ICD-10-CM

## 2019-02-05 DIAGNOSIS — R509 Fever, unspecified: Secondary | ICD-10-CM

## 2019-02-05 LAB — COMPREHENSIVE METABOLIC PANEL
ALT: 19 U/L (ref 0–44)
AST: 23 U/L (ref 15–41)
Albumin: 2 g/dL — ABNORMAL LOW (ref 3.5–5.0)
Alkaline Phosphatase: 128 U/L — ABNORMAL HIGH (ref 38–126)
Anion gap: 11 (ref 5–15)
BUN: 18 mg/dL (ref 8–23)
CO2: 22 mmol/L (ref 22–32)
Calcium: 8.2 mg/dL — ABNORMAL LOW (ref 8.9–10.3)
Chloride: 104 mmol/L (ref 98–111)
Creatinine, Ser: 0.88 mg/dL (ref 0.61–1.24)
GFR calc Af Amer: >60 mL/min (ref >60)
GFR calc non Af Amer: >60 mL/min (ref >60)
Glucose, Bld: 121 mg/dL — ABNORMAL HIGH (ref 70–99)
Potassium: 3.3 mmol/L — ABNORMAL LOW (ref 3.5–5.1)
Sodium: 137 mmol/L (ref 135–145)
Total Bilirubin: 0.8 mg/dL (ref 0.3–1.2)
Total Protein: 5.6 g/dL — ABNORMAL LOW (ref 6.5–8.1)

## 2019-02-05 LAB — CBC
HCT: 32.4 % — ABNORMAL LOW (ref 39.0–52.0)
Hemoglobin: 11 g/dL — ABNORMAL LOW (ref 13.0–17.0)
MCH: 30.5 pg (ref 26.0–34.0)
MCHC: 34 g/dL (ref 30.0–36.0)
MCV: 89.8 fL (ref 80.0–100.0)
Platelets: 131 10*3/uL — ABNORMAL LOW (ref 150–400)
RBC: 3.61 MIL/uL — ABNORMAL LOW (ref 4.22–5.81)
RDW: 15.7 % — ABNORMAL HIGH (ref 11.5–15.5)
WBC: 6.2 10*3/uL (ref 4.0–10.5)
nRBC: 0 % (ref 0.0–0.2)

## 2019-02-05 LAB — GLUCOSE, CAPILLARY
Glucose-Capillary: 113 mg/dL — ABNORMAL HIGH (ref 70–99)
Glucose-Capillary: 118 mg/dL — ABNORMAL HIGH (ref 70–99)
Glucose-Capillary: 125 mg/dL — ABNORMAL HIGH (ref 70–99)
Glucose-Capillary: 137 mg/dL — ABNORMAL HIGH (ref 70–99)
Glucose-Capillary: 165 mg/dL — ABNORMAL HIGH (ref 70–99)
Glucose-Capillary: 170 mg/dL — ABNORMAL HIGH (ref 70–99)

## 2019-02-05 LAB — MAGNESIUM: Magnesium: 2 mg/dL (ref 1.7–2.4)

## 2019-02-05 MED ORDER — POTASSIUM CHLORIDE 20 MEQ/15ML (10%) PO SOLN
40.0000 meq | Freq: Once | ORAL | Status: AC
  Administered 2019-02-05: 40 meq via ORAL
  Filled 2019-02-05: qty 30

## 2019-02-05 NOTE — Progress Notes (Addendum)
PHARMACY CONSULT NOTE FOR:  OUTPATIENT  PARENTERAL ANTIBIOTIC THERAPY (OPAT)  Indication: Chloecystitis/ intra-abdominal infection Regimen: Meropenem 1 gm q 8 hours  End date: May 10th, 2020  IV antibiotic discharge orders are pended. To discharging provider:  please sign these orders via discharge navigator,  Select New Orders & click on the button choice - Manage This Unsigned Work.     Thank you for allowing pharmacy to be a part of this patient's care.  Jimmy Footman, PharmD, BCPS, BCIDP Infectious Diseases Clinical Pharmacist Phone: [phone] 02/05/2019, 2:52 PM

## 2019-02-05 NOTE — Progress Notes (Signed)
[facility] for Infectious Disease   Reason for visit: Follow up on recurrent cholangitis  Antimicrobials: Meropenem, day 5  Lines: RT arm midline, biliary drain  Interval History: MS back to baseline per wife; I had a prolonged > 30 minute conversation with the wife today re: goals of care. She refuses to acknowledge or discuss end-of-life issues for her husband as she has for his entire admission thus far. She shared frustration with me re: surgery's declination to operate on her husband and particularly her dissatisfaction that the surgeon has not contacted her himself to discuss this with her as the patient's HCPOA. She now is requesting that her husband be transferred to other facilities in the state, which I informed her would be unlikely to happen given government restrictions on transportation, etc. during the [place] outbreak. She also is insisting arrangements be made for her husband's biliary drain to be assessed by IR every 3 weeks, regardless of function. Explained to her that this was not a usual standard of care, so there could be no measure put in place to make this occur. She did confirm that upon hospital d/c, she will be taking the patient home with her.  Cxs, labs, surgical notes, fever curve and imaging all independently reviewed   Physical Exam: Constitutional: chronically ill appearing, minimally interactive but does verbalize a few words today. NAD, oriented only to person Vitals:   02/05/19 0509 02/05/19 0853  BP:  98/64  Pulse: 87 93  Resp: 16 17  Temp:  97.8 F (36.6 C)  SpO2: 95% 95%  Eyes: anicteric, PERRL, EOMI HENT: no thrush, dry MM, adequate dentition Respiratory: Normal respiratory effort; CTA B, no wheeze Cardiovascular: NRRR, I/VI diastolic murmur at apex GI: soft, nt, nd, +biliary drain with bilious output Extrems: trace LE edema, 1+ pulses throughout MSK: normal muscle bulk & tone Skin: poor skin turgor, no rashes Neuro: oriented only to  person, minimal verbalization, no focal neurologic deficits appreciated  Review of Systems: Unable to obtain as pt is mostly nonverbal and demented per baseline  Lab Results  Component Value Date   WBC 6.2 02/05/2019   HGB 11.0 (L) 02/05/2019   HCT 32.4 (L) 02/05/2019   MCV 89.8 02/05/2019   PLT 131 (L) 02/05/2019    Lab Results  Component Value Date   CREATININE 0.88 02/05/2019   BUN 18 02/05/2019   NA 137 02/05/2019   K 3.3 (L) 02/05/2019   CL 104 02/05/2019   CO2 22 02/05/2019    Lab Results  Component Value Date   ALT 19 02/05/2019   AST 23 02/05/2019   ALKPHOS 128 (H) 02/05/2019     Microbiology: No results found for this or any previous visit (from the past 240 hour(s)).  Impression/Plan: Patient is an 85 year old white male with advanced dementia history of gram-negative rod sepsis and endocarditis, status post ICD removal, coronary artery disease, status post TAVR for severe aortic stenosis, and AAA admitted with fever, sepsis, and recurrent cholangitis with biliary drain malfunction.  1.  Recurrent biliary sepsis/cholangitis- patient has history of multiple gram-negative rod bacteremia/episodes of sepsis over the past several months.  On 1 of these occasions the patient developed endocarditis and required extraction of his ICD.  Unfortunately, after 1 of these prior episodes patient had a TAVR apparatus placed for severe aortic stenosis as well.  While his blood cultures are negative it presents on this admission his clinical picture was consistent with sepsis.  He did have malfunction of  his biliary drain which has now been replaced by interventional radiology with prompt presents purulent material it is now transition to more bilious output from this drain.  Our gastroenterology colleagues have assisted by performing an ERCP within the last week and were able to extract multiple common bile duct stones and biliary sludging.  Cultures from his biliary drain have now grown  enterococcus and Enterobacter as he has previously had on other admissions.  The persistence of these 2 pathogen strongly argues for a persistent and fixed focus within the patient's gallbladder.  Imaging does suggest that he has a remnant large stone remaining within his gallbladder which is likely serving as a nidus for his repetitive infections.  The surgery service at this time of use him is too high risk for any surgical intervention.  While efforts have been made to leave the patient and his wife down a palliative course, patient's wife adamantly refuses to have palliative care involved and refuses to discuss any end-of-life issues with any providers offering care for her husband.  Instead, she insists that "something be done" to cure her husband.  While I do agree that his infection risk will be ongoing despite any length of antibiotic treatment, she refuses to acknowledge this risk and often blames medical providers for "not doing enough" to cure her husband.  Additionally, his surgical risk would be rather high for intervention at approximately 5% risk of fatal complications.  Similarly, if he is simply allowed to have recurrent sepsis without ability to manipulate his fixed nidus of infection, his mortality from continued ongoing medical treatment is approaching approximately 50% or higher.  At this time, I have offered the patient and his wife 3 weeks of parenteral therapy following his ERCP.  I have stressed to the patient's wife on multiple occasions that I do expect over time that his infection will return and that she will need to ultimately address that further medical treatment is nearing the point of futility in this circumstance.  The patient's wife wishes to pursue transfer to other facilities for intervention for her husband despite my discouragement of this direction in his care as I do not feel that this is a reasonable request in the setting of her current COVID outbreak in government self  quarantine status for the state.  At this time, the patient is wife prefers to take him home at the time of his discharge from the hospital.  Antibiotic prescriptions for meropenem at present dosing were written with a tentative discharge antibiotic date of Feb 21, 2019.  Patient's wife requests to see Dr. Tommy Medal in our clinic for follow-up.  2.  Fever- this issue was isolated to early in the patient's hospital admission.  This markedly improved once the patient's biliary drain was exchanged by IR and empiric antibiotics were started.  Continue antibiotics as above.  Repeat blood cultures x2 for fever greater than 100.5.  Blood cultures thus far have been negative to date.    3.  Disposition- I spent greater than 30 minutes of time with direct face-to-face contact with the patient and his wife discussing what infectious disease could offer her husband, particularly in the absence of surgical intervention that is been recommended by multiple providers in our service.  I spent an additional 30 minutes of time discussing arrangements with home health, case management, and pharmacy staff to arrange for follow-up for the patient and outpatient IV antibiotics.  Patient has a functional midline to his right arm which can  remain intact for the duration of his treatment.  We will sign off at this time.  Please call with any questions.

## 2019-02-05 NOTE — Progress Notes (Addendum)
Daughter emailed me this AM requesting a phone call. Spent about 50 min on phone from 12-1 with daughter, [name].  At this point after speaking with neurology Wednesday, cardiology last week, additional [facility] partners, his delirium after ERCP, & operative risk profile calculated in NSQIP, I no longer feel surgery is an option for this patient and agree with Dr Vonna Kotyk note from yesterday. I think the risks outweigh the benefits.  Spent the majority of time discussing rationale for this. I think chance of recurrent ongoing infections is lower than risk of general anesthesia with surgical removal of gallbladder (open or lap- see note from last week regarding operative risk & 8-27% perioperative mortality risk) & the postoperative issues that would occur. Moreover, I think his functional and mental status decline would be severely accelerated with surgery.   I discussed they can always seek a 2nd opinion at [facility], [facility] or [facility] as an outpatient. She asked for specific name. I told her I didn't have a specific name at these institutions.  I know that [facility] has a 'prehab' program for geriatric patients undergoing surgery but that I didn't know if the surgeons at [facility] or other facilities would even offer surgery to him. She inquired about Dr Hassell Done in our practice since their is a family friend that knows him. I told her I would discuss with him.  I reviewed pt's case with Dr Hassell Done and he also feels that pt is not an operative candidate & would be best served with chronic cholecystostomy tube and oral antibiotic as needed. He also did not have a specific name of a surgeon for them to see at [facility]/other facility.   Cont gallbladder drain indefinitely.   Leighton Ruff. Redmond Pulling, MD, FACS General, Bariatric, & Minimally Invasive Surgery [facility], [place]

## 2019-02-05 NOTE — Progress Notes (Signed)
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   Patient underwent cholecystostomy tube  revision by IR, and also underwent ERCP. Although sepsis pathophysiology has improved-further hospital course complicated by acute encephalopathy.  General surgery, GI, ID and palliative care following. See below for further details  Assessment & Plan:   Principal Problem:   Biliary sepsis Active Problems:   Chronic systolic CHF (congestive heart failure) (HCC)   Ventricular tachycardia (HCC)   Dementia (HCC)   Coronary artery disease involving native coronary artery of native heart without angina pectoris   Hyperglycemia   Acute metabolic encephalopathy   Transaminitis   Dementia without behavioral disturbance [facility])   Preoperative cardiovascular examination   Palliative care by specialist   Choledocholithiasis   Adult failure to thrive   DNR (do not resuscitate)  Severe sepsis secondary to acute cholecystitis, cholangitis:  Sepsisphysiology has improved- s/p percutaneous cholecystostomy tube exchange on 4/13 and ERCP with sphincterotomy on 4/17.    Bile cultures on 4/13 + for enterococcus and Enterobacter ID following-remains on meropenem.   Blood cultures are negative.  Cardiology evaluated him from surgical risk perspective and felt he would be high risk for any procedure  Post  ERCP developed encephalopathy which apparently made family concerned regarding pursuing cholecystectomy Per neuro note 4/22, "risk benefit ratio does not go in favor of his surgery". I discussed case with surgery again today.  Last surgery note 4/21.  From surgery standpoint, pt not candidate for surgery.  See updated note from 4/23. I discussed with Dr. Prince Rome from [place] today as well regarding updated ID recs (recommendation for surgery based on 4/20 surgery note).  I had long discussion with family 4/23 and discussed plan of care with several members of the care team (ID, surgery, palliative care).  Discussed based on my review of chart, risks thought to outweigh benefits from surgical perspective.  Per ID notes from 4/17, there was mention of course of abx, but "this [would] not solve [the] problem".  Discussed question is long term plan at this point in time.   Appreciate ID who will update care plan at this point. Dr. Redmond Pulling has also called family to discuss surgical perspective.  Note from Dr. [name] yesterday noted pt not candidate for surgery.  Acute metabolic encephalopathy: Improved. Delirium precautions Hold off on seroquel given improvement Continue B12 supplementation MRI brain with atrophy and chronic ischemic change CT head without acute abnormality EEG without epileptiform activity Neurology consulted and felt this was toxic metabolic encephalopathy with progression of cognitive derangement/dementia in setting of acute illness.  See 4/22 note.  History of ventricular tachycardia had AICD in place but this was explanted in December 2019 for ESBL producing Klebsiella lead endocarditis:Monitored in telemetry-continue Coregand amiodarone  History of severe aortic stenosis s/p TAVR in November 2019:TEE on 2/13 with stable bioprosthetic aortic valve in place.    Chronic systolic heart failure (EF 25-30% by TTE on 11/23/2018):Volume status appears relatively stable-follow.  CAD:  No anginal symptoms-Plavix on  hold  Severe deconditioning/debility: Secondary to acute illness-and numerous recent hospitalizations-we will need PT evaluation.  AAA (4.6 x 4 cm infrarenal): Stable for outpatient follow-up.  Multiple small pulmonary nodules throughout the lung seen on CT chest on 08/03/2018:Stable for outpatient follow-up  Left Wrist Pain: follow up wrist plain films  DVT prophylaxis: heparin Code Status: DNR Family Communication: wife at bedside/daughter over the phone Disposition Plan: pending   Consultants:   ID  Palliative care  Surgery  Cardiology  GI  Procedures:  4/13 cholecystostomy tube exchange with fluoroscopy 4/16: Drain check and placement of new suture 4/17 ERCP  Antimicrobials:  Anti-infectives (From admission, onward)   Start     Dose/Rate Route Frequency Ordered Stop   02/01/19 2200  ceFEPIme (MAXIPIME) 2 g in sodium chloride 0.9 % 100 mL IVPB  Status:  Discontinued     2 g 200 mL/hr over 30 Minutes Intravenous Every 12 hours 02/01/19 1044 02/01/19 1151   02/01/19 1300  meropenem (MERREM) 1 g in sodium chloride 0.9 % 100 mL IVPB     1 g 200 mL/hr over 30 Minutes Intravenous Every 8 hours 02/01/19 1151 02/19/19 2359   01/28/19 1400  ceFEPIme (MAXIPIME) 2 g in sodium chloride 0.9 % 100 mL IVPB  Status:  Discontinued     2 g 200 mL/hr over 30 Minutes Intravenous Every 8 hours 01/28/19 0830 02/01/19 1044   01/25/19 2200  vancomycin (VANCOCIN) 1,500 mg in sodium chloride 0.9 % 500 mL IVPB  Status:  Discontinued     1,500 mg 250 mL/hr over 120 Minutes Intravenous Every 24 hours 01/24/19 2027 01/26/19 1124   01/25/19 0200  ceFEPIme (MAXIPIME) 2 g in sodium chloride 0.9 % 100 mL IVPB  Status:  Discontinued     2 g 200 mL/hr over 30 Minutes Intravenous Every 12 hours 01/24/19 2211 01/28/19 0830   01/24/19 2200  ceFEPIme (MAXIPIME) 2 g in sodium chloride 0.9 % 100 mL IVPB  Status:  Discontinued     2 g 200 mL/hr over 30 Minutes  Intravenous  Once 01/24/19 2158 01/24/19 2208   01/24/19 2200  metroNIDAZOLE (FLAGYL) IVPB 500 mg  Status:  Discontinued     500 mg 100 mL/hr over 60 Minutes Intravenous Every 8 hours 01/24/19 2158 02/01/19 1151   01/24/19 1945  piperacillin-tazobactam (ZOSYN) IVPB 3.375 g     3.375 g 100 mL/hr over 30 Minutes Intravenous  Once 01/24/19 1931 01/24/19 2017   01/24/19 1930  ceFEPIme (MAXIPIME) 2 g in sodium chloride 0.9 % 100 mL IVPB  Status:  Discontinued     2 g 200 mL/hr over 30 Minutes Intravenous  Once 01/24/19 1918 01/24/19 2010   01/24/19 1930  metroNIDAZOLE (FLAGYL) IVPB 500 mg  Status:  Discontinued     500 mg 100 mL/hr over 60 Minutes Intravenous  Once 01/24/19 1918 01/24/19 1931   01/24/19 1930  vancomycin (VANCOCIN) IVPB 1000 mg/200 mL premix  Status:  Discontinued     1,000 mg 200 mL/hr over 60 Minutes Intravenous  Once 01/24/19 1918 01/24/19 1922   01/24/19 1930  vancomycin (VANCOCIN) 2,000 mg in sodium chloride 0.9 % 500 mL IVPB     2,000 mg 250 mL/hr over 120 Minutes Intravenous  Once 01/24/19 1922 01/25/19 0012      Subjective: Wife at bedside. Pt doesn't say much, but speaks when encouraged by wife.  Objective: Vitals:   02/05/19 0449 02/05/19 0453 02/05/19 0509 02/05/19 0853  BP:  102/71  98/64  Pulse:  89 87 93  Resp:  (!) 25 16 17   Temp:    97.8 F (36.6 C)  TempSrc:    Oral  SpO2:  96% 95% 95%  Weight: 90.8 kg     Height:        Intake/Output Summary (Last 24 hours) at 02/05/2019 1518 Last data filed at 02/05/2019 1459 Gross per 24 hour  Intake 842.8 ml  Output 580 ml  Net 262.8 ml   Filed Weights   02/02/19 0500 02/04/19 0500 02/05/19 0449  Weight: 89.8 kg 91.5 kg 90.8 kg    Examination:  General: No acute distress. Cardiovascular: Heart sounds show Wei Newbrough regular rate, and rhythm. Lungs: Clear to auscultation bilaterally  Abdomen: Soft, nontender, nondistended.  Drain in place. Neurological: Alert, disoriented. Moves all extremities 4. Cranial  nerves II through XII grossly intact. Skin: Warm and dry. No rashes or lesions. Extremities: No clubbing or cyanosis. No edema.    Data Reviewed: I have personally reviewed following labs and imaging studies  CBC: Recent Labs  Lab 01/31/19 0240 02/01/19 0404 02/02/19 0503 02/03/19 0349 02/05/19 0626  WBC 6.9 6.8 7.3 10.4 6.2  HGB 11.0* 12.2* 12.6* 12.7* 11.0*  HCT 33.4* 37.5* 36.4* 36.1* 32.4*  MCV 93.8 90.8 89.4 88.0 89.8  PLT 122* 128* 142* 163 329*   Basic Metabolic Panel: Recent Labs  Lab 01/30/19 0346 01/31/19 0240 02/01/19 0404 02/02/19 0503 02/03/19 0349 02/04/19 0350 02/05/19 0626  NA 139 138 132* 131* 131* 135 137  K 3.9 3.4* 3.8 3.7 3.9 3.4* 3.3*  CL 109 108 102 100 102 104 104  CO2 22 19* 22 20* 20* 22 22  GLUCOSE 111* 122* 77 152* 176* 132* 121*  BUN 15 15 14 12 12 14 18   CREATININE 0.92 0.98 0.97 1.01 1.03 1.02 0.88  CALCIUM 8.4* 8.1* 8.2* 8.5* 8.7* 8.3* 8.2*  MG 2.0 2.0 2.0  --   --   --  2.0   GFR: Estimated Creatinine Clearance: 64.5 mL/min (by C-G formula based on SCr of 0.88 mg/dL). Liver Function Tests: Recent Labs  Lab 02/01/19 0404 02/02/19 0503 02/03/19 0349 02/04/19 0350 02/05/19 0626  AST 18 19 17 17 23   ALT 27 24 22 17 19   ALKPHOS 209* 190* 183* 139* 128*  BILITOT 1.2 1.2 1.3* 1.1 0.8  PROT 6.7 6.7 6.9 6.3* 5.6*  ALBUMIN 2.4* 2.4* 2.5* 2.2* 2.0*   No results for input(s): LIPASE, AMYLASE in the last 168 hours. Recent Labs  Lab 02/03/19 0349  AMMONIA 20   Coagulation Profile: Recent Labs  Lab 02/01/19 0404  INR 1.2   Cardiac Enzymes: No results for input(s): CKTOTAL, CKMB, CKMBINDEX, TROPONINI in the last 168 hours. BNP (last 3 results) No results for input(s): PROBNP in the last 8760 hours. HbA1C: No results for input(s): HGBA1C in the last 72 hours. CBG: Recent Labs  Lab 02/04/19 2009 02/05/19 0001 02/05/19 0432 02/05/19 0759 02/05/19 1207  GLUCAP 159* 137* 113* 118* 170*   Lipid Profile: No results for  input(s): CHOL, HDL, LDLCALC, TRIG, CHOLHDL, LDLDIRECT in the last 72 hours. Thyroid Function Tests: Recent Labs    02/03/19 0349  TSH 1.596   Anemia Panel: Recent Labs    02/03/19 0349  VITAMINB12 395   Sepsis Labs: No results for input(s): PROCALCITON, LATICACIDVEN in the last 168 hours.  No results found for this or any previous visit (from the past 240 hour(s)).       Radiology Studies: Dg Forearm Left  Result Date:  02/03/2019 CLINICAL DATA:  Left arm pain. EXAM: LEFT FOREARM - 2 VIEW COMPARISON:  None. FINDINGS: There is no evidence of fracture or other focal bone lesions. Vascular calcifications are noted. IMPRESSION: No significant abnormality seen in the left forearm. Electronically Signed   By: Marijo Conception M.D.   On: 02/03/2019 15:46   Dg Shoulder Left  Result Date: 02/03/2019 CLINICAL DATA:  Left shoulder pain. EXAM: LEFT SHOULDER - 2+ VIEW COMPARISON:  Radiographs of October 10, 2018. FINDINGS: There is no evidence of fracture or dislocation. Moderate degenerative changes seen involving the left glenohumeral joint. Soft tissues are unremarkable. 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 No acute abnormality seen in the left shoulder. Electronically Signed   By: Marijo Conception M.D.   On: 02/03/2019 15:50        Scheduled Meds: . amiodarone  100 mg Oral Daily  . carvedilol  3.125 mg Oral BID WC  . heparin injection (subcutaneous)  5,000 Units Subcutaneous Q8H  . insulin aspart  0-9 Units Subcutaneous Q4H  . mouth rinse  15 mL Mouth Rinse BID  . midodrine  2.5 mg Oral TID WC  . pantoprazole  40 mg Oral QHS  . sodium chloride flush  3 mL Intravenous Q12H  . sodium chloride flush  5 mL Intracatheter Q8H  . vitamin B-12  250 mcg Oral Daily   Continuous Infusions: . sodium chloride 250 mL (01/30/19 1736)  . sodium chloride 40 mL/hr at 02/05/19 0505  . meropenem (MERREM) IV 1 g (02/05/19 1456)     LOS: 12 days    Time  spent: over 30 min    Fayrene Helper, MD [facility] Pager AMION  If 7PM-7AM, please contact night-coverage [email or web address] Password [facility] 02/05/2019, 3:18 PM

## 2019-02-05 NOTE — TOC Initial Note (Addendum)
Transition of Care [facility]) - Initial/Assessment Note    Patient Details  Name: William [name] MRN: [id number] Date of Birth: 31-May-1928  Transition of [facility]) CM/SW Contact:    Sharin Mons, RN Phone Number: 02/05/2019, 2:44 PM  Clinical Narrative:    Admitted with biliary sepsis. Pt from SNF/[facility].       [name] Daleo (Spouse) Carroll Lingelbach (Daughter)     [phone]     NCM and CSW following for SNF vs home with home health services. NCM spoke with wife regarding d/c planning . Wife states doesn't really want pt to go back to SNF 2/2 family unable to make visits. Wife stated she is having family meeting today (4/24) to discuss best plan for pt.  Expected Discharge Plan: Skilled Nursing Facility Barriers to Discharge: Continued Medical Work up   Patient Goals and CMS Choice Patient states their goals for this hospitalization and ongoing recovery are:: Feeling better CMS Medicare.gov Compare Post Acute Care list provided to:: Other (Comment Required)(Already at SNF) Choice offered to / list presented to : Spouse  Expected Discharge Plan and Services Expected Discharge Plan: [place] In-house Referral: Clinical Social Work Discharge Planning Services: NA Post Acute Care Choice: [place] Living arrangements for the past 2 months: [place]                 DME Arranged: N/A DME Agency: NA       HH Arranged: NA [place] Agency: NA        Prior Living Arrangements/Services Living arrangements for the past 2 months: [place] Lives with:: Facility Resident Patient language and need for interpreter reviewed:: Yes Do you feel safe going back to the place where you live?: Yes      Need for Family Participation in Patient Care: Yes (Comment) Care giver support system in place?: Yes (comment)   Criminal Activity/Legal Involvement Pertinent to Current Situation/Hospitalization: No -  Comment as needed  Activities of Daily Living Home Assistive Devices/Equipment: Environmental consultant (specify type), Wheelchair ADL Screening (condition at time of admission) Patient's cognitive ability adequate to safely complete daily activities?: No Is the patient deaf or have difficulty hearing?: Yes Does the patient have difficulty seeing, even when wearing glasses/contacts?: Yes Does the patient have difficulty concentrating, remembering, or making decisions?: Yes Patient able to express need for assistance with ADLs?: No Does the patient have difficulty dressing or bathing?: Yes Independently performs ADLs?: No Does the patient have difficulty walking or climbing stairs?: Yes Weakness of Legs: Both Weakness of Arms/Hands: Both  Permission Sought/Granted Permission sought to share information with : Facility Sport and exercise psychologist, Family Supports Permission granted to share information with : Yes, Verbal Permission Granted  Share Information with NAME: Webb Silversmith  Permission granted to share info w AGENCY: [facility]  Permission granted to share info w Relationship: Spouse  Permission granted to share info w Contact Information: [phone]  Emotional Assessment Appearance:: Appears stated age Attitude/Demeanor/Rapport: Unable to Assess Affect (typically observed): Unable to Assess Orientation: : (Disoriented x4) Alcohol / Substance Use: Not Applicable Psych Involvement: No (comment)  Admission diagnosis:  Transaminitis [R74.0] Patient Active Problem List   Diagnosis Date Noted  . Adult failure to thrive   . DNR (do not resuscitate)   . Choledocholithiasis   . Preoperative cardiovascular examination   . Palliative care by specialist   . Dementia without behavioral disturbance ([place]) 01/26/2019  . Transaminitis   . Biliary sepsis 01/24/2019  . Hyperglycemia 01/24/2019  .  Acute metabolic encephalopathy 79/48/0165  . LFTs abnormal   . Constipation 01/08/2019  . Acute cholecystitis   .  Cholangitis 11/25/2018  . Bacteremia due to Enterobacter species 11/24/2018  . Enterococcus faecalis infection 11/24/2018  . Pressure injury of skin 11/22/2018  . Coronary artery disease involving native coronary artery of native heart without angina pectoris 09/22/2018  . Closed fracture of lower end of right ulna 09/22/2018  . Acute bacterial endocarditis   . 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 Bacteremia due to Klebsiella pneumoniae   . S/P TAVR (transcatheter aortic valve replacement) 08/18/2018  . Ventricular tachycardia ([place])   . Dementia ([place])   . Chronic systolic CHF (congestive heart failure) ([place]) 08/07/2018  . Severe aortic stenosis   . Prostate cancer ([place]) 01/20/2014  . S/P ICD (internal cardiac defibrillator) procedure, 01/11/14 removal of ERI gen and placement of Medtronic Evera XT VR & NEW Right ventricular lead Medtronic 01/12/2014  . Nonischemic cardiomyopathy ([place]) 05/28/2013  . Hyperlipidemia 05/28/2013  . AAA (abdominal aortic aneurysm) ([place]) 05/28/2013   PCP:  Gayland Curry, DO Pharmacy:   [facility] [place], [place] - [address] [place] [address] Phone: [phone] Fax: [phone]     Social Determinants of Health (SDOH) Interventions    Readmission Risk Interventions No flowsheet data found.

## 2019-02-05 NOTE — NC FL2 (Signed)
[place] LEVEL OF CARE SCREENING TOOL     IDENTIFICATION  Patient Name: William [name] Birthdate: [birth year] Sex: male Admission Date (Current Location): 01/24/2019  [facility] and [place] Number:  Herbalist and Address:  [facility] [place] [address] [place], [place] [address]      Provider Number: [id number]  Attending Physician Name and Address:  Elodia Florence., *  Relative Name and Phone Number:  Webb Silversmith, spouse, [phone]    Current Level of Care: Hospital Recommended Level of Care: [place] Prior Approval Number:    Date Approved/Denied:   PASRR Number:    Discharge Plan: SNF    Current Diagnoses: Patient Active Problem List   Diagnosis Date Noted  . Adult failure to thrive   . DNR (do not resuscitate)   . Choledocholithiasis   . Preoperative cardiovascular examination   . Palliative care by specialist   . Dementia without behavioral disturbance ([place]) 01/26/2019  . Transaminitis   . Biliary sepsis 01/24/2019  . Hyperglycemia 01/24/2019  . Acute metabolic encephalopathy 51/76/1607  . LFTs abnormal   . Constipation 01/08/2019  . Acute cholecystitis   . Cholangitis 11/25/2018  . Bacteremia due to Enterobacter species 11/24/2018  . Enterococcus faecalis infection 11/24/2018  . Pressure injury of skin 11/22/2018  . Coronary artery disease involving native coronary artery of native heart without angina pectoris 09/22/2018  . Closed fracture of lower end of right ulna 09/22/2018  . Acute bacterial endocarditis   . 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 Bacteremia due to Klebsiella pneumoniae   . S/P TAVR (transcatheter aortic valve replacement) 08/18/2018  . Ventricular tachycardia ([place])   . Dementia ([place])   . Chronic systolic CHF (congestive heart failure) ([place]) 08/07/2018  . Severe aortic stenosis   . Prostate cancer ([place]) 01/20/2014  . S/P ICD (internal cardiac defibrillator) procedure,  01/11/14 removal of ERI gen and placement of Medtronic Evera XT VR & NEW Right ventricular lead Medtronic 01/12/2014  . Nonischemic cardiomyopathy ([place]) 05/28/2013  . Hyperlipidemia 05/28/2013  . AAA (abdominal aortic aneurysm) ([place]) 05/28/2013    Orientation RESPIRATION BLADDER Height & Weight     (Disoriented x4)  Normal Incontinent Weight: 200 lb 2.8 oz (90.8 kg) Height:  5\' 10"  (177.8 cm)  BEHAVIORAL SYMPTOMS/MOOD NEUROLOGICAL BOWEL NUTRITION STATUS      Incontinent(Biliary tube) Diet(Please see DC Summary)  AMBULATORY STATUS COMMUNICATION OF NEEDS Skin   Extensive Assist Verbally Normal                       Personal Care Assistance Level of Assistance  Bathing, Feeding, Dressing Bathing Assistance: Maximum assistance Feeding assistance: Maximum assistance Dressing Assistance: Maximum assistance     Functional Limitations Info  Sight, Hearing, Speech Sight Info: Adequate Hearing Info: Impaired Speech Info: Adequate    SPECIAL CARE FACTORS FREQUENCY                       Contractures Contractures Info: Not present    Additional Factors Info  Code Status, Allergies, Isolation Precautions, Insulin Sliding Scale Code Status Info: Full Allergies Info: Crestor Rosuvastatin, Lipitor Atorvastatin, Shrimp Shellfish Allergy   Insulin Sliding Scale Info: Every 4 hours Isolation Precautions Info: ESBL     Current Medications (02/05/2019):  This is the current hospital active medication list Current Facility-Administered Medications  Medication Dose Route Frequency Provider Last Rate Last Dose  . 0.9 %  sodium  chloride infusion  250 mL Intravenous PRN Milus Banister, MD 10 mL/hr at 01/30/19 1736 250 mL at 01/30/19 1736  . 0.9 %  sodium chloride infusion   Intravenous Continuous Jonetta Osgood, MD 40 mL/hr at 02/05/19 0505    . acetaminophen (TYLENOL) tablet 650 mg  650 mg Oral Q6H PRN Milus Banister, MD   650 mg at 01/28/19 1828   Or  . acetaminophen  (TYLENOL) suppository 650 mg  650 mg Rectal Q6H PRN Milus Banister, MD      . amiodarone (PACERONE) tablet 100 mg  100 mg Oral Daily Milus Banister, MD   100 mg at 02/05/19 2706  . carvedilol (COREG) tablet 3.125 mg  3.125 mg Oral BID WC Josue Hector, MD   3.125 mg at 02/02/19 0905  . heparin injection 5,000 Units  5,000 Units Subcutaneous Q8H Thurnell Lose, MD   5,000 Units at 02/05/19 0501  . insulin aspart (novoLOG) injection 0-9 Units  0-9 Units Subcutaneous Q4H Milus Banister, MD   2 Units at 02/05/19 1252  . iohexol (OMNIPAQUE) 300 MG/ML solution 10 mL  10 mL Intravenous Once PRN Milus Banister, MD      . iohexol (OMNIPAQUE) 300 MG/ML solution 10 mL  10 mL Intravenous Once PRN Milus Banister, MD      . lidocaine (PF) (XYLOCAINE) 1 % injection    PRN Markus Daft, MD   5 mL at 01/25/19 1450  . MEDLINE mouth rinse  15 mL Mouth Rinse BID Thurnell Lose, MD   15 mL at 02/05/19 0930  . meropenem (MERREM) 1 g in sodium chloride 0.9 % 100 mL IVPB  1 g Intravenous Q8H Powers, Evern Core, MD 200 mL/hr at 02/05/19 0507 1 g at 02/05/19 0507  . midodrine (PROAMATINE) tablet 2.5 mg  2.5 mg Oral TID WC Ghimire, Henreitta Leber, MD   2.5 mg at 02/05/19 1254  . ondansetron (ZOFRAN) tablet 4 mg  4 mg Oral Q6H PRN Milus Banister, MD       Or  . ondansetron [facility]) injection 4 mg  4 mg Intravenous Q6H PRN Milus Banister, MD   4 mg at 01/29/19 0945  . pantoprazole (PROTONIX) EC tablet 40 mg  40 mg Oral QHS Jonetta Osgood, MD   40 mg at 02/04/19 2206  . sodium chloride flush (NS) 0.9 % injection 10-40 mL  10-40 mL Intracatheter PRN Milus Banister, MD   10 mL at 02/02/19 2376  . sodium chloride flush (NS) 0.9 % injection 3 mL  3 mL Intravenous Q12H Milus Banister, MD   3 mL at 02/04/19 2211  . sodium chloride flush (NS) 0.9 % injection 3 mL  3 mL Intravenous PRN Milus Banister, MD   3 mL at 01/25/19 0013  . sodium chloride flush (NS) 0.9 % injection 5 mL  5 mL Intracatheter Q8H Milus Banister, MD   5 mL at 02/05/19 2831  . vitamin B-12 (CYANOCOBALAMIN) tablet 250 mcg  250 mcg Oral Daily Amie Portland, MD   250 mcg at 02/05/19 5176     Discharge Medications: Please see discharge summary for a list of discharge medications.  Relevant Imaging Results:  Relevant Lab Results:   Additional Information SSN: [id number]   Biliary tube  Benard Halsted, LCSW

## 2019-02-06 LAB — GLUCOSE, CAPILLARY
Glucose-Capillary: 114 mg/dL — ABNORMAL HIGH (ref 70–99)
Glucose-Capillary: 118 mg/dL — ABNORMAL HIGH (ref 70–99)
Glucose-Capillary: 136 mg/dL — ABNORMAL HIGH (ref 70–99)
Glucose-Capillary: 149 mg/dL — ABNORMAL HIGH (ref 70–99)
Glucose-Capillary: 149 mg/dL — ABNORMAL HIGH (ref 70–99)
Glucose-Capillary: 195 mg/dL — ABNORMAL HIGH (ref 70–99)

## 2019-02-06 LAB — COMPREHENSIVE METABOLIC PANEL
ALT: 24 U/L (ref 0–44)
AST: 30 U/L (ref 15–41)
Albumin: 2.1 g/dL — ABNORMAL LOW (ref 3.5–5.0)
Alkaline Phosphatase: 120 U/L (ref 38–126)
Anion gap: 8 (ref 5–15)
BUN: 17 mg/dL (ref 8–23)
CO2: 22 mmol/L (ref 22–32)
Calcium: 8.2 mg/dL — ABNORMAL LOW (ref 8.9–10.3)
Chloride: 106 mmol/L (ref 98–111)
Creatinine, Ser: 0.88 mg/dL (ref 0.61–1.24)
GFR calc Af Amer: >60 mL/min (ref >60)
GFR calc non Af Amer: >60 mL/min (ref >60)
Glucose, Bld: 125 mg/dL — ABNORMAL HIGH (ref 70–99)
Potassium: 3.6 mmol/L (ref 3.5–5.1)
Sodium: 136 mmol/L (ref 135–145)
Total Bilirubin: 0.9 mg/dL (ref 0.3–1.2)
Total Protein: 5.8 g/dL — ABNORMAL LOW (ref 6.5–8.1)

## 2019-02-06 LAB — CBC
HCT: 32.4 % — ABNORMAL LOW (ref 39.0–52.0)
Hemoglobin: 11.3 g/dL — ABNORMAL LOW (ref 13.0–17.0)
MCH: 31.3 pg (ref 26.0–34.0)
MCHC: 34.9 g/dL (ref 30.0–36.0)
MCV: 89.8 fL (ref 80.0–100.0)
Platelets: 138 10*3/uL — ABNORMAL LOW (ref 150–400)
RBC: 3.61 MIL/uL — ABNORMAL LOW (ref 4.22–5.81)
RDW: 15.7 % — ABNORMAL HIGH (ref 11.5–15.5)
WBC: 5.8 10*3/uL (ref 4.0–10.5)
nRBC: 0 % (ref 0.0–0.2)

## 2019-02-06 LAB — MAGNESIUM: Magnesium: 2 mg/dL (ref 1.7–2.4)

## 2019-02-06 MED ORDER — POTASSIUM CHLORIDE 20 MEQ PO PACK
40.0000 meq | PACK | Freq: Once | ORAL | Status: AC
  Administered 2019-02-06: 40 meq via ORAL
  Filled 2019-02-06: qty 2

## 2019-02-06 NOTE — Plan of Care (Signed)
  Problem: Nutrition: Goal: Adequate nutrition will be maintained Outcome: Progressing Note:  Nutrition/appetite decreased some.

## 2019-02-06 NOTE — Evaluation (Signed)
Physical Therapy Evaluation Patient Details Name: William [name] MRN: [id number] DOB: [birth year] Today's Date: 02/06/2019   History of Present Illness  Pt is an 85 y.o. male admitted from SNF on 01/24/19 with sepsis secondary to recurrent cholecystitis/cholangitis in the setting of occluded cholecystostomy tube. S/p cholecystostomy tube revision and ERCP. Hospital course complicated by acute encephalopathy. 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 Other PMH includes CHF, aortic stenosis s/p TAVR, dementia.    Clinical Impression  Pt presents with an overall decrease in functional mobility secondary to above. PTA, pt receiving rehab at SNF, requiring hoyer lift for transfers OOB. Today, pt requiring totalA for bed mobility; pt limited by generalized weakness and cognitive impairments. Wife present during session and reports she plans to have pt return home with [facility] services and 24/7 aide/CNA assist; they will require a wheelchair with elevating leg rests (already own [facility], hospital bed, hoyer lift, transport chair, etc.). Increased time discussing pt's return home with wife and educ re: mobility, use of lift, importance of positioning/pressure relief, etc. If pt to remain admitted, will follow acutely. Recommend OOB transfers with maximove lift.     Follow Up Recommendations Home health PT;Supervision/Assistance - 24 hour(wife declined continued SNF; plans to have pt return home with [facility] services and 24/7 support)    Equipment Recommendations  Wheelchair (measurements PT);Wheelchair cushion (measurements PT)(with elevating leg rests)    Recommendations for Other Services       Precautions / Restrictions Precautions Precautions: Fall Precaution Comments: cholecystostomy drain Restrictions Weight Bearing Restrictions: No      Mobility  Bed Mobility Overal bed mobility: Needs  Assistance             General bed mobility comments: TotalA for repositioning in bed; pt pulled up and HOB elevated, BP down to 79/55 (RN notified)  Transfers                 General transfer comment: NT. Will require +2 assist. Recommend OOB transfer with maximove lift  Ambulation/Gait                Stairs            Wheelchair Mobility    Modified Rankin (Stroke Patients Only)       Balance                                             Pertinent Vitals/Pain Pain Assessment: Faces Faces Pain Scale: No hurt    Home Living Family/patient expects to be discharged to:: Private residence Living Arrangements: Spouse/significant other Available Help at Discharge: Family;Friend(s);Available 24 hours/day Type of Home: House Home Access: Stairs to enter Entrance Stairs-Rails: None Entrance Stairs-Number of Steps: threshold Home Layout: Two level;Able to live on main level with bedroom/bathroom Home Equipment: Hospital bed;Walker - 2 wheels;Transport chair;Bedside commode;Other (comment) Additional Comments: Wife providing home setup. Plans to purchase hoyer lift from a friend    Prior Function Level of Independence: Needs assistance   Gait / Transfers Assistance Needed: Pt has been getting rehab at [facility]. Wife reports pt "stood for 20 seconds on his own" (with a RW) with PT earlier in April  ADL's / Homemaking Assistance Needed: Bowel/bladder incontinence, dependent for ADLs        Hand Dominance   Dominant Hand:  Right    Extremity/Trunk Assessment   Upper Extremity Assessment Upper Extremity Assessment: Generalized weakness;Difficult to assess due to impaired cognition    Lower Extremity Assessment Lower Extremity Assessment: Generalized weakness;Difficult to assess due to impaired cognition;RLE deficits/detail;LLE deficits/detail RLE Deficits / Details: Some purposeful movement in R ankle/toes to command, not moving  knee/hip when asked LLE Deficits / Details: Some purposeful movement in L ankle/toes to command, not moving knee/hip when asked. Observed at least 3/5 functional strength in L hip flexors as pt automatically raising up leg for pillow readjustment       Communication   Communication: HOH  Cognition Arousal/Alertness: Awake/alert Behavior During Therapy: Flat affect Overall Cognitive Status: History of cognitive impairments - at baseline                                 General Comments: Pt not answering questions consistently for this PT or for wife. Following one-step commands <25% of time, performed a couple automatic things like hold his leg up when PT attempting to reposition pillow and arm for BP cuff, but would not do this when asked      General Comments General comments (skin integrity, edema, etc.): Wife present to discuss plan for patient to return home with 24/7 aide/CNA assist (will [facility] services). Educ re: positioning, pressure relief, risk for wounds, importance of OOB with lift, caregiver burnout, etc.; problem solved through home set-up and equipment needs    Exercises     Assessment/Plan    PT Assessment Patient needs continued PT services  PT Problem List Decreased strength;Decreased activity tolerance;Decreased balance;Decreased mobility;Decreased cognition;Decreased knowledge of use of DME;Decreased safety awareness;Decreased skin integrity       PT Treatment Interventions DME instruction;Functional mobility training;Therapeutic activities;Therapeutic exercise;Balance training;Patient/family education;Wheelchair mobility training    PT Goals (Current goals can be found in the Care Plan section)  Acute Rehab PT Goals Patient Stated Goal: Return home PT Goal Formulation: With family Time For Goal Achievement: 02/20/19 Potential to Achieve Goals: Good    Frequency Min 2X/week   Barriers to discharge        Co-evaluation                AM-PAC PT "6 Clicks" Mobility  Outcome Measure Help needed turning from your back to your side while in a flat bed without using bedrails?: Total Help needed moving from lying on your back to sitting on the side of a flat bed without using bedrails?: Total Help needed moving to and from a bed to a chair (including a wheelchair)?: Total Help needed standing up from a chair using your arms (e.g., wheelchair or bedside chair)?: Total Help needed to walk in hospital room?: Total Help needed climbing 3-5 steps with a railing? : Total 6 Click Score: 6    End of Session   Activity Tolerance: Patient limited by fatigue Patient left: in bed;with call bell/phone within reach;with bed alarm set;with family/visitor present Nurse Communication: Mobility status;Need for lift equipment PT Visit Diagnosis: Muscle weakness (generalized) (M62.81)    Time: 8119-1478 PT Time Calculation (min) (ACUTE ONLY): 40 min   Charges:   PT Evaluation $PT Eval Moderate Complexity: 1 Mod PT Treatments $Self Care/Home Management: Vincent, PT, DPT Acute Rehabilitation Services  Pager [phone] Office [place] 02/06/2019, 1:37 PM

## 2019-02-06 NOTE — Progress Notes (Signed)
**Note De-Identified vi Obfusction** PROGRESS NOTE    Grvis Downum Sultn  LZJ:[id number] DOB: Jn 13, 1929 DOA: 01/24/2019 PCP: Gylnd Curry, DO   Brief Nrrtive:  Ptient is 85 y.o.mlewith history of chronic systolic hert filure, s/p ortic stenosis requiring TAVR in November 2019, history of ESBL producing Klebsiell bcteremi with ICD led endocrditis requiring ICD explnttion December 2019-s/p 6 weeks of Invnz on 10/27/18, recent history (2/8-2/14) of severe sepsis with cute cholecystitis nd Enterobcter bcteremi-s/p cholecystostomy drin-presented to the hospitl on 4/12 with sepsis secondry to recurrent cholecystitis/cholngitis in the setting of occluded cholecystostomy tube.   Ptient underwent cholecystostomy tube  revision by IR, nd lso underwent ERCP. Although sepsis pthophysiology hs improved-further hospitl course complicted by cute encephlopthy.  Generl surgery, GI, ID nd pllitive cre following. See below for further detils  Assessment & Pln:   Principl Problem:   Biliry sepsis Active Problems:   Chronic systolic CHF (congestive hert filure) (HCC)   Ventriculr tchycrdi (HCC)   Dementi (HCC)   Coronry rtery disese involving ntive coronry rtery of ntive hert without ngin pectoris   Hyperglycemi   Acute metbolic encephlopthy   Trnsminitis   Dementi without behviorl disturbnce [facility])   Preopertive crdiovsculr exmintion   Pllitive cre by specilist   Choledocholithisis   Adult filure to thrive   DNR (do not resuscitte)  Severe sepsis secondry to cute cholecystitis, cholngitis:  Sepsisphysiology hs improved- s/p percutneous cholecystostomy tube exchnge on 4/13 nd ERCP with sphincterotomy on 4/17.    Bile cultures on 4/13 + for enterococcus nd Enterobcter ID following-remins on meropenem.   Blood cultures re negtive.  Crdiology evluted him from surgicl risk perspective nd felt he would be high risk for ny procedure  Post  ERCP developed encephlopthy which pprently mde fmily concerned regrding pursuing cholecystectomy Per neuro note 4/22, "risk benefit rtio does not go in fvor of his surgery". I discussed cse with surgery gin tody.  Lst surgery note 4/21.  From surgery stndpoint, pt not cndidte for surgery.  See updted note from 4/23. I discussed with Dr. Prince Rome from [place] tody s well regrding updted ID recs (recommendtion for surgery bsed on 4/20 surgery note).  I hd long discussion with fmily 4/23 nd discussed pln of cre with severl members of the cre tem (ID, surgery, pllitive cre).  Discussed bsed on my review of chrt, risks thought to outweigh benefits from surgicl perspective.  Per ID notes from 4/17, there ws mention of course of bx, but "this [would] not solve [the] problem".  Discussed question is long term pln t this point in time.   Dr. Redmond Pulling spoke with pt dughter on 4/24.  From [facility] stndpoint, surgery not option.   At this point, pln going forwrd is dischrge home [place] on ntibiotics.  At tht point, pt will hve 24/7 cre t home.  The fmily is plnning to pursue  second opinion from surgery elsewhere s n outptient.  The ptient's dughter hs contcts in [place] which she will pursue nd they cn lso get outptient referrl from her PCP/ID.  Will need IR nd GI follow up fter d/c s well.   Acute metbolic encephlopthy: Improved. Delirium precutions Hold off on seroquel given improvement Continue B12 supplementtion MRI brin with trophy nd chronic ischemic chnge CT hed without cute bnormlity EEG without epileptiform ctivity Neurology consulted nd felt this ws toxic metbolic encephlopthy with progression of cognitive derngement/dementi in setting of cute illness.  See 4/22 note.  History of ventriculr tchycrdi hd AICD in plce but this  was explanted in December 2019 for ESBL producing Klebsiella lead endocarditis:Monitored  in telemetry-continue Coregand amiodarone  History of severe aortic stenosis s/p TAVR in November 2019:TEE on 2/13 with stable bioprosthetic aortic valve in place.    Chronic systolic heart failure (EF 25-30% by TTE on 11/23/2018):Volume status appears relatively stable-follow.  CAD: No anginal symptoms-Plavix on hold  Severe deconditioning/debility: Secondary to acute illness-and numerous recent hospitalizations-we will need PT evaluation.  AAA (4.6 x 4 cm infrarenal): Stable for outpatient follow-up.  Multiple small pulmonary nodules throughout the lung seen on CT chest on 08/03/2018:Stable for outpatient follow-up  Left Wrist Pain: OA  DVT prophylaxis: heparin Code Status: DNR Family Communication: wife at bedside/daughter over the phone Disposition Plan: pending   Consultants:   ID  Palliative care  Surgery  Cardiology  GI  Procedures:  4/13 cholecystostomy tube exchange with fluoroscopy 4/16: Drain check and placement of new suture 4/17 ERCP  Antimicrobials:  Anti-infectives (From admission, onward)   Start     Dose/Rate Route Frequency Ordered Stop   02/01/19 2200  ceFEPIme (MAXIPIME) 2 g in sodium chloride 0.9 % 100 mL IVPB  Status:  Discontinued     2 g 200 mL/hr over 30 Minutes Intravenous Every 12 hours 02/01/19 1044 02/01/19 1151   02/01/19 1300  meropenem (MERREM) 1 g in sodium chloride 0.9 % 100 mL IVPB     1 g 200 mL/hr over 30 Minutes Intravenous Every 8 hours 02/01/19 1151 02/19/19 2359   01/28/19 1400  ceFEPIme (MAXIPIME) 2 g in sodium chloride 0.9 % 100 mL IVPB  Status:  Discontinued     2 g 200 mL/hr over 30 Minutes Intravenous Every 8 hours 01/28/19 0830 02/01/19 1044   01/25/19 2200  vancomycin (VANCOCIN) 1,500 mg in sodium chloride 0.9 % 500 mL IVPB  Status:  Discontinued     1,500 mg 250 mL/hr over 120 Minutes Intravenous Every 24 hours 01/24/19 2027 01/26/19 1124   01/25/19 0200  ceFEPIme (MAXIPIME) 2 g in sodium chloride 0.9 %  100 mL IVPB  Status:  Discontinued     2 g 200 mL/hr over 30 Minutes Intravenous Every 12 hours 01/24/19 2211 01/28/19 0830   01/24/19 2200  ceFEPIme (MAXIPIME) 2 g in sodium chloride 0.9 % 100 mL IVPB  Status:  Discontinued     2 g 200 mL/hr over 30 Minutes Intravenous  Once 01/24/19 2158 01/24/19 2208   01/24/19 2200  metroNIDAZOLE (FLAGYL) IVPB 500 mg  Status:  Discontinued     500 mg 100 mL/hr over 60 Minutes Intravenous Every 8 hours 01/24/19 2158 02/01/19 1151   01/24/19 1945  piperacillin-tazobactam (ZOSYN) IVPB 3.375 g     3.375 g 100 mL/hr over 30 Minutes Intravenous  Once 01/24/19 1931 01/24/19 2017   01/24/19 1930  ceFEPIme (MAXIPIME) 2 g in sodium chloride 0.9 % 100 mL IVPB  Status:  Discontinued     2 g 200 mL/hr over 30 Minutes Intravenous  Once 01/24/19 1918 01/24/19 2010   01/24/19 1930  metroNIDAZOLE (FLAGYL) IVPB 500 mg  Status:  Discontinued     500 mg 100 mL/hr over 60 Minutes Intravenous  Once 01/24/19 1918 01/24/19 1931   01/24/19 1930  vancomycin (VANCOCIN) IVPB 1000 mg/200 mL premix  Status:  Discontinued     1,000 mg 200 mL/hr over 60 Minutes Intravenous  Once 01/24/19 1918 01/24/19 1922   01/24/19 1930  vancomycin (VANCOCIN) 2,000 mg in sodium chloride 0.9 % 500 mL IVPB **Note De-Identified vi Obfusction** 2,000 mg 250 mL/hr over 120 Minutes Intrvenous  Once 01/24/19 1922 01/25/19 0012      Subjective: Wife t bedside. Denies pin or discomfort. Wife notes he my be  bit more uncomfortble tody.  Objective: Vitls:   02/06/19 1512 02/06/19 1539 02/06/19 1640 02/06/19 1728  BP: 98/66 (!) 97/51  91/67  Pulse:  (!) 51  94  Resp:  (!) 0    Temp: 98.6 F (37 C)  98.2 F (36.8 C)   TempSrc: Orl  Orl   SpO2:  96%    Weight:      Height:        Intke/Output Summry (Lst 24 hours) t 02/06/2019 1754 Lst dt filed t 02/06/2019 5726 Gross per 24 hour  Intke 170 ml  Output 2025 ml  Net -1855 ml   Filed Weights   02/04/19 0500 02/05/19 0449 02/06/19 0500  Weight: 91.5 kg  90.8 kg 90.3 kg    Exmintion:  Generl: No cute distress. Crdiovsculr: Hert sounds show  regulr rte, nd rhythm. Lungs: Cler to usculttion bilterlly Abdomen: Soft, nontender, nondistended.  Drin in plce. Neurologicl: Alert, disoriented. Moves ll extremities 4. Crnil nerves II through XII grossly intct. Skin: Wrm nd dry. No rshes or lesions. Extremities: No clubbing or cynosis. No edem.     Dt Reviewed: I hve personlly reviewed following lbs nd imging studies  CBC: Recent Lbs  Lb 02/01/19 0404 02/02/19 0503 02/03/19 0349 02/05/19 0626 02/06/19 0242  WBC 6.8 7.3 10.4 6.2 5.8  HGB 12.2* 12.6* 12.7* 11.0* 11.3*  HCT 37.5* 36.4* 36.1* 32.4* 32.4*  MCV 90.8 89.4 88.0 89.8 89.8  PLT 128* 142* 163 131* 203*   Bsic Metbolic Pnel: Recent Lbs  Lb 01/31/19 0240 02/01/19 0404 02/02/19 0503 02/03/19 0349 02/04/19 0350 02/05/19 0626 02/06/19 0242  NA 138 132* 131* 131* 135 137 136  K 3.4* 3.8 3.7 3.9 3.4* 3.3* 3.6  CL 108 102 100 102 104 104 106  CO2 19* 22 20* 20* 22 22 22   GLUCOSE 122* 77 152* 176* 132* 121* 125*  BUN 15 14 12 12 14 18 17   CREATININE 0.98 0.97 1.01 1.03 1.02 0.88 0.88  CALCIUM 8.1* 8.2* 8.5* 8.7* 8.3* 8.2* 8.2*  MG 2.0 2.0  --   --   --  2.0 2.0   GFR: Estimted Cretinine Clernce: 64.3 mL/min (by [employer] formul bsed on SCr of 0.88 mg/dL). Liver Function Tests: Recent Lbs  Lb 02/02/19 0503 02/03/19 0349 02/04/19 0350 02/05/19 0626 02/06/19 0242  AST 19 17 17 23 30   ALT 24 22 17 19 24   ALKPHOS 190* 183* 139* 128* 120  BILITOT 1.2 1.3* 1.1 0.8 0.9  PROT 6.7 6.9 6.3* 5.6* 5.8*  ALBUMIN 2.4* 2.5* 2.2* 2.0* 2.1*   No results for input(s): LIPASE, AMYLASE in the lst 168 hours. Recent Lbs  Lb 02/03/19 0349  AMMONIA 20   Cogultion Profile: Recent Lbs  Lb 02/01/19 0404  INR 1.2   Crdic Enzymes: No results for input(s): CKTOTAL, CKMB, CKMBINDEX, TROPONINI in the lst 168 hours. BNP (lst 3  results) No results for input(s): PROBNP in the lst 8760 hours. HbA1C: No results for input(s): HGBA1C in the lst 72 hours. CBG: Recent Lbs  Lb 02/06/19 0015 02/06/19 0435 02/06/19 0822 02/06/19 1233 02/06/19 1637  GLUCAP 149* 118* 136* 195* 114*   Lipid Profile: No results for input(s): CHOL, HDL, LDLCALC, TRIG, CHOLHDL, LDLDIRECT in the lst 72 hours. Thyroid Function Tests: No results for input(s): TSH,  T4TOTAL, FREET4, T3FREE, THYROIDAB in the last 72 hours. Anemia Panel: No results for input(s): VITAMINB12, FOLATE, FERRITIN, TIBC, IRON, RETICCTPCT in the last 72 hours. Sepsis Labs: No results for input(s): PROCALCITON, LATICACIDVEN in the last 168 hours.  No results found for this or any previous visit (from the past 240 hour(s)).       Radiology Studies: Dg Wrist 2 Views Left  Result Date: 02/05/2019 CLINICAL DATA:  Left wrist pain. EXAM: LEFT WRIST - 2 VIEW COMPARISON:  Radiographs of February 03, 2019. FINDINGS: There is no evidence of fracture or dislocation. Vascular calcifications are noted. Mild degenerative changes seen involving the first carpometacarpal joint as well as the scaphoid trapezoid space. IMPRESSION: Mild osteoarthritis as described above. No acute abnormality seen in the left wrist. Electronically Signed   By: Marijo Conception M.D.   On: 02/05/2019 17:01        Scheduled Meds: . amiodarone  100 mg Oral Daily  . carvedilol  3.125 mg Oral BID WC  . heparin injection (subcutaneous)  5,000 Units Subcutaneous Q8H  . insulin aspart  0-9 Units Subcutaneous Q4H  . mouth rinse  15 mL Mouth Rinse BID  . midodrine  2.5 mg Oral TID WC  . pantoprazole  40 mg Oral QHS  . potassium chloride  40 mEq Oral Once  . sodium chloride flush  3 mL Intravenous Q12H  . sodium chloride flush  5 mL Intracatheter Q8H  . vitamin B-12  250 mcg Oral Daily   Continuous Infusions: . sodium chloride 250 mL (01/30/19 1736)  . meropenem (MERREM) IV 1 g (02/06/19 1434)      LOS: 13 days    Time spent: over 30 min    Fayrene Helper, MD [facility] Pager AMION  If 7PM-7AM, please contact night-coverage [email or web address] Password [facility] 02/06/2019, 5:54 PM

## 2019-02-07 LAB — COMPREHENSIVE METABOLIC PANEL
ALT: 25 U/L (ref 0–44)
AST: 33 U/L (ref 15–41)
Albumin: 2.2 g/dL — ABNORMAL LOW (ref 3.5–5.0)
Alkaline Phosphatase: 121 U/L (ref 38–126)
Anion gap: 10 (ref 5–15)
BUN: 16 mg/dL (ref 8–23)
CO2: 21 mmol/L — ABNORMAL LOW (ref 22–32)
Calcium: 8.4 mg/dL — ABNORMAL LOW (ref 8.9–10.3)
Chloride: 104 mmol/L (ref 98–111)
Creatinine, Ser: 0.95 mg/dL (ref 0.61–1.24)
GFR calc Af Amer: >60 mL/min (ref >60)
GFR calc non Af Amer: >60 mL/min (ref >60)
Glucose, Bld: 119 mg/dL — ABNORMAL HIGH (ref 70–99)
Potassium: 4 mmol/L (ref 3.5–5.1)
Sodium: 135 mmol/L (ref 135–145)
Total Bilirubin: 0.9 mg/dL (ref 0.3–1.2)
Total Protein: 6 g/dL — ABNORMAL LOW (ref 6.5–8.1)

## 2019-02-07 LAB — CBC
HCT: 37.2 % — ABNORMAL LOW (ref 39.0–52.0)
Hemoglobin: 12.5 g/dL — ABNORMAL LOW (ref 13.0–17.0)
MCH: 31.4 pg (ref 26.0–34.0)
MCHC: 33.6 g/dL (ref 30.0–36.0)
MCV: 93.5 fL (ref 80.0–100.0)
Platelets: 153 10*3/uL (ref 150–400)
RBC: 3.98 MIL/uL — ABNORMAL LOW (ref 4.22–5.81)
RDW: 15.7 % — ABNORMAL HIGH (ref 11.5–15.5)
WBC: 5.6 10*3/uL (ref 4.0–10.5)
nRBC: 0 % (ref 0.0–0.2)

## 2019-02-07 LAB — GLUCOSE, CAPILLARY
Glucose-Capillary: 111 mg/dL — ABNORMAL HIGH (ref 70–99)
Glucose-Capillary: 115 mg/dL — ABNORMAL HIGH (ref 70–99)
Glucose-Capillary: 124 mg/dL — ABNORMAL HIGH (ref 70–99)
Glucose-Capillary: 121 mg/dL — ABNORMAL HIGH (ref 70–99)
Glucose-Capillary: 123 mg/dL — ABNORMAL HIGH (ref 70–99)
Glucose-Capillary: 167 mg/dL — ABNORMAL HIGH (ref 70–99)

## 2019-02-07 LAB — MAGNESIUM: Magnesium: 2 mg/dL (ref 1.7–2.4)

## 2019-02-07 MED ORDER — POLYETHYLENE GLYCOL 3350 17 G PO PACK
17.0000 g | PACK | Freq: Two times a day (BID) | ORAL | Status: DC
  Administered 2019-02-07 – 2019-02-08 (×3): 17 g via ORAL
  Filled 2019-02-07 (×3): qty 1

## 2019-02-07 NOTE — Progress Notes (Signed)
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   Patient underwent cholecystostomy tube  revision by IR, and also underwent ERCP. Although sepsis pathophysiology has improved-further hospital course complicated by acute encephalopathy.  General surgery, GI, ID and palliative care following. See below for further details  Assessment & Plan:   Principal Problem:   Biliary sepsis Active Problems:   Chronic systolic CHF (congestive heart failure) (HCC)   Ventricular tachycardia (HCC)   Dementia (HCC)   Coronary artery disease involving native coronary artery of native heart without angina pectoris   Hyperglycemia   Acute metabolic encephalopathy   Transaminitis   Dementia without behavioral disturbance [facility])   Preoperative cardiovascular examination   Palliative care by specialist   Choledocholithiasis   Adult failure to thrive   DNR (do not resuscitate)  Severe sepsis secondary to acute cholecystitis, cholangitis:  Sepsisphysiology has improved- s/p percutaneous cholecystostomy tube exchange on 4/13 and ERCP with sphincterotomy on 4/17.    Bile cultures on 4/13 + for enterococcus and Enterobacter ID following-remains on meropenem.   Blood cultures are negative.  Cardiology evaluated him from surgical risk perspective and felt he would be high risk for any procedure  Post  ERCP developed encephalopathy which apparently made family concerned regarding pursuing cholecystectomy Per neuro note 4/22, "risk benefit ratio does not go in favor of his surgery". I discussed case with surgery again today.  Last surgery note 4/21.  From surgery standpoint, pt not candidate for surgery.  See updated note from 4/23. I discussed with Dr. Prince Rome from [place] today as well regarding updated ID recs (recommendation for surgery based on 4/20 surgery note).  I had long discussion with family 4/23 and discussed plan of care with several members of the care team (ID, surgery, palliative care).  Discussed based on my review of chart, risks thought to outweigh benefits from surgical perspective.  Per ID notes from 4/17, there was mention of course of abx, but "this [would] not solve [the] problem".  Discussed question is long term plan at this point in time.   Dr. Redmond Pulling spoke with pt daughter on 4/24.  From [facility] standpoint, surgery not option.   At this point, plan going forward is discharge home Monday on antibiotics.  At that point, pt will have 24/7 care at home.  The family is planning to pursue a second opinion from surgery elsewhere as an outpatient.  The patient's daughter has contacts in [place] which she will pursue and they can also get outpatient referral from her PCP/ID.  Will need IR and GI follow up after d/c as well.   Acute metabolic encephalopathy: Improved. Delirium precautions Hold off on seroquel given improvement Continue B12 supplementation MRI brain with atrophy and chronic ischemic change CT head without acute abnormality EEG without epileptiform activity Neurology consulted and felt this was toxic metabolic encephalopathy with progression of cognitive derangement/dementia in setting of acute illness.  See 4/22 note.  History of ventricular tachycardia had AICD in place but this  was explanted in December 2019 for ESBL producing Klebsiella lead endocarditis:Monitored  in telemetry-continue Coregand amiodarone  History of severe aortic stenosis s/p TAVR in November 2019:TEE on 2/13 with stable bioprosthetic aortic valve in place.    Chronic systolic heart failure (EF 25-30% by TTE on 11/23/2018):Volume status appears relatively stable-follow.  CAD: No anginal symptoms-Plavix on hold  Severe deconditioning/debility: Secondary to acute illness-and numerous recent hospitalizations-we will need PT evaluation.  AAA (4.6 x 4 cm infrarenal): Stable for outpatient follow-up.  Multiple small pulmonary nodules throughout the lung seen on CT chest on 08/03/2018:Stable for outpatient follow-up  Left Wrist Pain: OA  DVT prophylaxis: heparin Code Status: DNR Family Communication: wife at bedside/daughter over the phone Disposition Plan: pending   Consultants:   ID  Palliative care  Surgery  Cardiology  GI  Procedures:  4/13 cholecystostomy tube exchange with fluoroscopy 4/16: Drain check and placement of new suture 4/17 ERCP  Antimicrobials:  Anti-infectives (From admission, onward)   Start     Dose/Rate Route Frequency Ordered Stop   02/01/19 2200  ceFEPIme (MAXIPIME) 2 g in sodium chloride 0.9 % 100 mL IVPB  Status:  Discontinued     2 g 200 mL/hr over 30 Minutes Intravenous Every 12 hours 02/01/19 1044 02/01/19 1151   02/01/19 1300  meropenem (MERREM) 1 g in sodium chloride 0.9 % 100 mL IVPB     1 g 200 mL/hr over 30 Minutes Intravenous Every 8 hours 02/01/19 1151 02/19/19 2359   01/28/19 1400  ceFEPIme (MAXIPIME) 2 g in sodium chloride 0.9 % 100 mL IVPB  Status:  Discontinued     2 g 200 mL/hr over 30 Minutes Intravenous Every 8 hours 01/28/19 0830 02/01/19 1044   01/25/19 2200  vancomycin (VANCOCIN) 1,500 mg in sodium chloride 0.9 % 500 mL IVPB  Status:  Discontinued     1,500 mg 250 mL/hr over 120 Minutes Intravenous Every 24 hours 01/24/19 2027 01/26/19 1124   01/25/19 0200  ceFEPIme (MAXIPIME) 2 g in sodium chloride 0.9 %  100 mL IVPB  Status:  Discontinued     2 g 200 mL/hr over 30 Minutes Intravenous Every 12 hours 01/24/19 2211 01/28/19 0830   01/24/19 2200  ceFEPIme (MAXIPIME) 2 g in sodium chloride 0.9 % 100 mL IVPB  Status:  Discontinued     2 g 200 mL/hr over 30 Minutes Intravenous  Once 01/24/19 2158 01/24/19 2208   01/24/19 2200  metroNIDAZOLE (FLAGYL) IVPB 500 mg  Status:  Discontinued     500 mg 100 mL/hr over 60 Minutes Intravenous Every 8 hours 01/24/19 2158 02/01/19 1151   01/24/19 1945  piperacillin-tazobactam (ZOSYN) IVPB 3.375 g     3.375 g 100 mL/hr over 30 Minutes Intravenous  Once 01/24/19 1931 01/24/19 2017   01/24/19 1930  ceFEPIme (MAXIPIME) 2 g in sodium chloride 0.9 % 100 mL IVPB  Status:  Discontinued     2 g 200 mL/hr over 30 Minutes Intravenous  Once 01/24/19 1918 01/24/19 2010   01/24/19 1930  metroNIDAZOLE (FLAGYL) IVPB 500 mg  Status:  Discontinued     500 mg 100 mL/hr over 60 Minutes Intravenous  Once 01/24/19 1918 01/24/19 1931   01/24/19 1930  vancomycin (VANCOCIN) IVPB 1000 mg/200 mL premix  Status:  Discontinued     1,000 mg 200 mL/hr over 60 Minutes Intravenous  Once 01/24/19 1918 01/24/19 1922   01/24/19 1930  vancomycin (VANCOCIN) 2,000 mg in sodium chloride 0.9 % 500 mL IVPB  2,000 mg 250 mL/hr over 120 Minutes Intravenous  Once 01/24/19 1922 01/25/19 0012      Subjective: Wife at bedside. Denies pain or discomfort. Wife concerned about constipation. Feels like he's doing better than yesterday.  Objective: Vitals:   02/07/19 0340 02/07/19 0412 02/07/19 0553 02/07/19 0819  BP: (!) 78/54  107/76 (!) 99/58  Pulse: 84  81 89  Resp: (!) 0  15   Temp:  97.6 F (36.4 C)    TempSrc:  Oral    SpO2: 99%  95%   Weight:      Height:        Intake/Output Summary (Last 24 hours) at 02/07/2019 1312 Last data filed at 02/07/2019 1100 Gross per 24 hour  Intake 128 ml  Output 2400 ml  Net -2272 ml   Filed Weights   02/04/19 0500 02/05/19 0449 02/06/19 0500   Weight: 91.5 kg 90.8 kg 90.3 kg    Examination:  General: No acute distress. Cardiovascular: Heart sounds show a regular rate, and rhythm. Lungs: Clear to auscultation bilaterally Abdomen: Soft, nontender, nondistended Neurological: Alert.  Disoriented. Moves all extremities 4. Cranial nerves II through XII grossly intact. Skin: Warm and dry. No rashes or lesions. Extremities: No clubbing or cyanosis. No edema.     Data Reviewed: I have personally reviewed following labs and imaging studies  CBC: Recent Labs  Lab 02/02/19 0503 02/03/19 0349 02/05/19 0626 02/06/19 0242 02/07/19 0255  WBC 7.3 10.4 6.2 5.8 5.6  HGB 12.6* 12.7* 11.0* 11.3* 12.5*  HCT 36.4* 36.1* 32.4* 32.4* 37.2*  MCV 89.4 88.0 89.8 89.8 93.5  PLT 142* 163 131* 138* 229   Basic Metabolic Panel: Recent Labs  Lab 02/01/19 0404  02/03/19 0349 02/04/19 0350 02/05/19 0626 02/06/19 0242 02/07/19 0255  NA 132*   < > 131* 135 137 136 135  K 3.8   < > 3.9 3.4* 3.3* 3.6 4.0  CL 102   < > 102 104 104 106 104  CO2 22   < > 20* 22 22 22  21*  GLUCOSE 77   < > 176* 132* 121* 125* 119*  BUN 14   < > 12 14 18 17 16   CREATININE 0.97   < > 1.03 1.02 0.88 0.88 0.95  CALCIUM 8.2*   < > 8.7* 8.3* 8.2* 8.2* 8.4*  MG 2.0  --   --   --  2.0 2.0 2.0   < > = values in this interval not displayed.   GFR: Estimated Creatinine Clearance: 59.6 mL/min (by C-G formula based on SCr of 0.95 mg/dL). Liver Function Tests: Recent Labs  Lab 02/03/19 0349 02/04/19 0350 02/05/19 0626 02/06/19 0242 02/07/19 0255  AST 17 17 23 30  33  ALT 22 17 19 24 25   ALKPHOS 183* 139* 128* 120 121  BILITOT 1.3* 1.1 0.8 0.9 0.9  PROT 6.9 6.3* 5.6* 5.8* 6.0*  ALBUMIN 2.5* 2.2* 2.0* 2.1* 2.2*   No results for input(s): LIPASE, AMYLASE in the last 168 hours. Recent Labs  Lab 02/03/19 0349  AMMONIA 20   Coagulation Profile: Recent Labs  Lab 02/01/19 0404  INR 1.2   Cardiac Enzymes: No results for input(s): CKTOTAL, CKMB, CKMBINDEX,  TROPONINI in the last 168 hours. BNP (last 3 results) No results for input(s): PROBNP in the last 8760 hours. HbA1C: No results for input(s): HGBA1C in the last 72 hours. CBG: Recent Labs  Lab 02/06/19 2013 02/07/19 0053 02/07/19 0409 02/07/19 0800 02/07/19 1150  GLUCAP 149* 111* 115*  124* 121*   Lipid Profile: No results for input(s): CHOL, HDL, LDLCALC, TRIG, CHOLHDL, LDLDIRECT in the last 72 hours. Thyroid Function Tests: No results for input(s): TSH, T4TOTAL, FREET4, T3FREE, THYROIDAB in the last 72 hours. Anemia Panel: No results for input(s): VITAMINB12, FOLATE, FERRITIN, TIBC, IRON, RETICCTPCT in the last 72 hours. Sepsis Labs: No results for input(s): PROCALCITON, LATICACIDVEN in the last 168 hours.  No results found for this or any previous visit (from the past 240 hour(s)).       Radiology Studies: Dg Wrist 2 Views Left  Result Date: 02/05/2019 CLINICAL DATA:  Left wrist pain. EXAM: LEFT WRIST - 2 VIEW COMPARISON:  Radiographs of February 03, 2019. FINDINGS: There is no evidence of fracture or dislocation. Vascular calcifications are noted. Mild degenerative changes seen involving the first carpometacarpal joint as well as the scaphoid trapezoid space. IMPRESSION: Mild osteoarthritis as described above. No acute abnormality seen in the left wrist. Electronically Signed   By: Marijo Conception M.D.   On: 02/05/2019 17:01        Scheduled Meds: . amiodarone  100 mg Oral Daily  . carvedilol  3.125 mg Oral BID WC  . heparin injection (subcutaneous)  5,000 Units Subcutaneous Q8H  . insulin aspart  0-9 Units Subcutaneous Q4H  . mouth rinse  15 mL Mouth Rinse BID  . midodrine  2.5 mg Oral TID WC  . pantoprazole  40 mg Oral QHS  . polyethylene glycol  17 g Oral BID  . sodium chloride flush  3 mL Intravenous Q12H  . sodium chloride flush  5 mL Intracatheter Q8H  . vitamin B-12  250 mcg Oral Daily   Continuous Infusions: . sodium chloride 250 mL (01/30/19 1736)  .  meropenem (MERREM) IV 1 g (02/07/19 0551)     LOS: 14 days    Time spent: over 52 min    Fayrene Helper, MD [facility] Pager AMION  If 7PM-7AM, please contact night-coverage [email or web address] Password [email or web address] 02/07/2019, 1:12 PM

## 2019-02-08 ENCOUNTER — Ambulatory Visit: Payer: Medicare Other | Admitting: Infectious Disease

## 2019-02-08 LAB — CBC
HCT: 33.8 % — ABNORMAL LOW (ref 39.0–52.0)
Hemoglobin: 11.9 g/dL — ABNORMAL LOW (ref 13.0–17.0)
MCH: 31.1 pg (ref 26.0–34.0)
MCHC: 35.2 g/dL (ref 30.0–36.0)
MCV: 88.3 fL (ref 80.0–100.0)
Platelets: 130 10*3/uL — ABNORMAL LOW (ref 150–400)
RBC: 3.83 MIL/uL — ABNORMAL LOW (ref 4.22–5.81)
RDW: 15.6 % — ABNORMAL HIGH (ref 11.5–15.5)
WBC: 5.7 10*3/uL (ref 4.0–10.5)
nRBC: 0 % (ref 0.0–0.2)

## 2019-02-08 LAB — COMPREHENSIVE METABOLIC PANEL
ALT: 24 U/L (ref 0–44)
AST: 26 U/L (ref 15–41)
Albumin: 2.2 g/dL — ABNORMAL LOW (ref 3.5–5.0)
Alkaline Phosphatase: 114 U/L (ref 38–126)
Anion gap: 10 (ref 5–15)
BUN: 16 mg/dL (ref 8–23)
CO2: 24 mmol/L (ref 22–32)
Calcium: 8.5 mg/dL — ABNORMAL LOW (ref 8.9–10.3)
Chloride: 101 mmol/L (ref 98–111)
Creatinine, Ser: 0.9 mg/dL (ref 0.61–1.24)
GFR calc Af Amer: >60 mL/min (ref >60)
GFR calc non Af Amer: >60 mL/min (ref >60)
Glucose, Bld: 107 mg/dL — ABNORMAL HIGH (ref 70–99)
Potassium: 3.3 mmol/L — ABNORMAL LOW (ref 3.5–5.1)
Sodium: 135 mmol/L (ref 135–145)
Total Bilirubin: 1.2 mg/dL (ref 0.3–1.2)
Total Protein: 5.9 g/dL — ABNORMAL LOW (ref 6.5–8.1)

## 2019-02-08 LAB — GLUCOSE, CAPILLARY
Glucose-Capillary: 101 mg/dL — ABNORMAL HIGH (ref 70–99)
Glucose-Capillary: 122 mg/dL — ABNORMAL HIGH (ref 70–99)
Glucose-Capillary: 152 mg/dL — ABNORMAL HIGH (ref 70–99)
Glucose-Capillary: 89 mg/dL (ref 70–99)

## 2019-02-08 LAB — MAGNESIUM: Magnesium: 2.1 mg/dL (ref 1.7–2.4)

## 2019-02-08 MED ORDER — MIDODRINE HCL 5 MG PO TABS
5.0000 mg | ORAL_TABLET | Freq: Three times a day (TID) | ORAL | Status: DC
  Administered 2019-02-08: 5 mg via ORAL
  Filled 2019-02-08: qty 1

## 2019-02-08 MED ORDER — CARVEDILOL 3.125 MG PO TABS: 3.1250 mg | ORAL_TABLET | Freq: Two times a day (BID) | ORAL | 0 refills | Status: DC

## 2019-02-08 MED ORDER — POTASSIUM CHLORIDE 20 MEQ PO PACK
40.0000 meq | PACK | Freq: Once | ORAL | Status: AC
  Administered 2019-02-08: 40 meq via ORAL
  Filled 2019-02-08: qty 2

## 2019-02-08 MED ORDER — CYANOCOBALAMIN 250 MCG PO TABS: 250.0000 ug | ORAL_TABLET | Freq: Every day | ORAL | 0 refills | Status: AC

## 2019-02-08 MED ORDER — MEROPENEM IV (FOR PTA / DISCHARGE USE ONLY): 1.0000 g | Freq: Three times a day (TID) | INTRAVENOUS | 0 refills | Status: DC

## 2019-02-08 MED ORDER — MIDODRINE HCL 5 MG PO TABS: 5.0000 mg | ORAL_TABLET | Freq: Three times a day (TID) | ORAL | 0 refills | Status: AC

## 2019-02-08 MED ORDER — SODIUM CHLORIDE 0.9% FLUSH: INTRAVENOUS | 0 refills | Status: DC

## 2019-02-08 MED ORDER — PANTOPRAZOLE SODIUM 40 MG PO TBEC: 40.0000 mg | DELAYED_RELEASE_TABLET | Freq: Every day | ORAL | 0 refills | Status: DC

## 2019-02-08 NOTE — Progress Notes (Signed)
Patient ID: William [name], male   DOB: [birth year], 85 y.o.   MRN: [id number]   Plan for follow up with IR 10-12 weeks IR scheduler will call pt with time and date of chole drain exchange  Will need to flush drain once daily with 5-10 cc sterile saline Record OP daily  OP follow up orders in chart

## 2019-02-08 NOTE — Progress Notes (Addendum)
    Durable Medical Equipment  (From admission, onward)         Start     Ordered   02/08/19 1014  For home use only DME lightweight manual wheelchair with seat cushion  Once    Comments:  Patient suffers from weakness which impairs their ability to perform daily activities like walking in the home.  A rolling waker will not resolve  issue with performing activities of daily living. A wheelchair will allow patient to safely perform daily activities. Patient is not able to propel themselves in the home using a standard weight wheelchair due to weakness. Patient can self propel in the lightweight wheelchair.   Accessories: elevating leg rests (ELRs), wheel locks, extensions and anti-tippers. Back cushion.   02/08/19 1016

## 2019-02-08 NOTE — Consult Note (Signed)
   [facility] CM Inpatient Consult   3/76/2831  Roi Jafari Holzmann [birth year] [id number]  Follow up:  Spoke with inpatient [facility] nurse regarding post hospital needs.  Patient's family is making plans for private pay nursing care. No other needs identified, will follow up with EMMI calls.  Patient's provider does the transition of care follow up.   Natividad Brood, RN BSN [facility] Liaison  [phone] business mobile phone Toll free office [phone]

## 2019-02-08 NOTE — Progress Notes (Signed)
Doyne Keel Freels to be D/C'd home per MD order. Discussed with the patient's wife and all questions fully answered. VVS, Skin clean and dry. Moisture noted to buttocks cleansed before discharge. Midline left in place. Biliary drain in place, emptied and flushed before discharge. An After Visit Summary was printed and given to the patient's wife. Wheelchair delivered to room. Patient escorted via stretcher, and D/C home with home health.  Melonie Florida  02/08/2019 5:35 PM

## 2019-02-08 NOTE — Discharge Summary (Signed)
Physician Discharge Summary  William [name] JQB:[id number] DOB: 02/11/1928 DOA: 01/24/2019  PCP: William Curry, DO  Admit date: 01/24/2019 Discharge date: 02/08/2019  Time spent: 40 minutes  Recommendations for Outpatient Follow-up:  1. Follow outpatient CBC/CMP 2. Complete abx per ID recs.  Follow OPAT labs. 3. Follow up with IR, GI.  Follow up with surgery as outpatient for second opinion. 4. Follow AAA outpatient 5. Follow pulm nodules outpatient per goc  Discharge Diagnoses:  Principal Problem:   Biliary sepsis Active Problems:   Chronic systolic CHF (congestive heart failure) (HCC)   Ventricular tachycardia (HCC)   Dementia (HCC)   Coronary artery disease involving native coronary artery of native heart without angina pectoris   Hyperglycemia   Acute metabolic encephalopathy   Transaminitis   Dementia without behavioral disturbance William [name])   Preoperative cardiovascular examination   Palliative care by specialist   Choledocholithiasis   Adult failure to thrive   DNR (do not resuscitate)   Discharge Condition: stable  Diet recommendation: heart healthy  Filed Weights   02/04/19 0500 02/05/19 0449 02/06/19 0500  Weight: 91.5 kg 90.8 kg 90.3 kg    History of present illness:  Patient is a85 y.o.malewith history of chronic systolic heart failure, s/p aortic stenosis requiring TAVR in November 2019, 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 Although sepsis pathophysiology has improved-further [name] course complicated  by acute encephalopathy. General surgery, GI, ID and palliative care following.See below for further details.   [name] Course:  Severe sepsis secondary to acute cholecystitis, cholangitis: Sepsisphysiology hasimproved-s/p percutaneous cholecystostomy tube exchange on 4/13 and ERCP with sphincterotomy on 4/17. Bile cultures on 4/13 + for enterococcus and Enterobacter ID following-remains on meropenem. Blood cultures are negative.  Cardiology evaluated him from surgical risk perspective and felt he would be high risk for any procedure Post ERCP developed encephalopathy which apparently made family concerned regarding pursuing cholecystectomy Per neuro note 4/22, "risk benefit ratio does not go in favor of his surgery". I discussed case with surgery again today.  Last surgery note 4/21.  From surgery standpoint, pt not candidate for surgery.  See updated note from 4/23. I discussed with William [name] from [place] today as well regarding updated ID recs (recommendation for surgery based on 4/20 surgery note).  I had long discussion with family 4/23 and discussed plan of care with several members of the care team (ID, surgery, palliative care).  Discussed based on my review of chart, risks thought to outweigh benefits from surgical perspective.  Per ID notes from 4/17, there was mention of course of abx, but "this [would] not solve [the] problem".  Discussed question is long term plan at this point in time.   William [name] spoke with pt daughter on 4/24.  From [facility] standpoint, surgery not option.   At this point, plan going forward is discharge home Monday, 4/27, on antibiotics.  At that point, pt will have 24/7 care at home.  The family is planning to pursue William [name] second opinion from surgery elsewhere as an outpatient.  The patient's daughter has contacts in [place] which she will pursue and they can also get outpatient referral from her PCP/ID.  Will need IR  and GI follow up after d/c as well.  We've  discussed possibility of palliative care as outpatient and family would like to hold off on this at this point.  She has their contact information when needed.  Acute metabolic encephalopathy: Improved.  Seems to be eating better. Delirium precautions Hold off on seroquel given improvement Continue B12 supplementation MRI brain with atrophy and chronic ischemic change CT head without acute abnormality EEG without epileptiform activity Neurology consulted and felt this was toxic metabolic encephalopathy with progression of cognitive derangement/dementia in setting of acute illness.  See 4/22 note.  History of ventricular tachycardia had AICD in place but this was explanted in December 2019 for ESBL producing Klebsiella lead endocarditis:Monitored in telemetry-continue Coregand amiodarone  History of severe aortic stenosis s/p TAVR in November 2019:TEE on 2/13 with stable bioprosthetic aortic valve in place.   Chronic systolic heart failure (EF 25-30% by TTE on 11/23/2018):Volume status appears relatively stable-follow.  CAD: No anginal symptoms.  Plavix d/c'd.  Severe deconditioning/debility: Secondary to acute illness-and numerous recent hospitalizations-we will need PT evaluation.  AAA (4.6 x 4 cm infrarenal): Stable for outpatient follow-up.  Multiple small pulmonary nodules throughout the lung seen on CT chest on 08/03/2018:Stable for outpatient follow-up  Left Wrist Pain: OA  Procedures: 4/13 cholecystostomy tube exchange with fluoroscopy 4/16: Drain check and placement of new suture 4/17 ERCP  Consultations:  ID  Palliative care  Surgery  Cardiology  GI  Discharge Exam: Vitals:   02/08/19 0751 02/08/19 1135  BP: (!) 88/70 99/71  Pulse: 78 80  Resp: 16 17  Temp:  (!) 97.5 F (36.4 C)  SpO2:  99%   Doesn't say much Wife notes he's eating well this morning. Ready for d/c home.  General: No acute distress. Cardiovascular: Heart sounds show  William [name] regular rate, and rhythm Lungs: Clear to auscultation bilaterally Abdomen: Soft, nontender, nondistended Neurological: Alert and oriented 3. Moves all extremities 4. Cranial nerves II through XII grossly intact. Skin: Warm and dry. No rashes or lesions. Extremities: No clubbing or cyanosis. No edema.  Psychiatric: Mood and affect are normal. Insight and judgment are appropriate.   Discharge Instructions   Discharge Instructions    Call MD for:  difficulty breathing, headache or visual disturbances   Complete by:  As directed    Call MD for:  extreme fatigue   Complete by:  As directed    Call MD for:  persistant dizziness or light-headedness   Complete by:  As directed    Call MD for:  persistant nausea and vomiting   Complete by:  As directed    Call MD for:  redness, tenderness, or signs of infection (pain, swelling, redness, odor or green/yellow discharge around incision site)   Complete by:  As directed    Call MD for:  severe uncontrolled pain   Complete by:  As directed    Call MD for:  temperature >100.4   Complete by:  As directed    Diet - low sodium heart healthy   Complete by:  As directed    Discharge instructions   Complete by:  As directed    You were seen for cholangitis.  You were seen by interventional radiology who exchanged your cholecystostomy tube and the by GI who performed an ERCP with sphincterotomy on 4/17.    You've been started on antibiotics for the intraabdominal infection.  These will continue until May 10.  Surgery saw you here and did not recommend any surgical procedure,  but you will be looking for Sailor Haughn second opinion as an outpatient.   Please follow up with interventional radiology, infectious disease, gastroenterology, and surgery as an outpatient.  Please follow up with palliative care as an outpatient.  Please arrange follow up with your primary care doctor as an outpatient.  Return for new, recurrent, or worsening symptoms.  Please ask  your PCP to request records from this hospitalization so they know what was done and what the next steps will be.   Home infusion instructions [facility] May follow [place] Dosing Protocol; May administer Cathflo as needed to maintain patency of vascular access device.; Flushing of vascular access device: per [facility] Protocol: 0.9% NaCl pre/post medica...   Complete by:  As directed    Instructions:  May follow [place] Dosing Protocol   Instructions:  May administer Cathflo as needed to maintain patency of vascular access device.   Instructions:  Flushing of vascular access device: per [facility] Protocol: 0.9% NaCl pre/post medication administration and prn patency; Heparin 100 u/ml, 22m for implanted ports and Heparin 10u/ml, 553mfor all other central venous catheters.   Instructions:  May follow [facility] Anaphylaxis Protocol for First Dose Administration in the home: 0.9% NaCl at 25-50 ml/hr to maintain IV access for protocol meds. Epinephrine 0.3 ml IV/IM PRN and Benadryl 25-50 IV/IM PRN s/s of anaphylaxis.   Instructions:  [facility] Coordinator (RN) to assist per patient IV care needs in the home PRN.   Increase activity slowly   Complete by:  As directed      Allergies as of 02/08/2019      Reactions   Crestor [rosuvastatin] Other (See Comments)   Hurts muscles   Lipitor [atorvastatin] Other (See Comments)   Hurts stomach   Shrimp [shellfish Allergy] Other (See Comments)   On MAR      Medication List    STOP taking these medications   clopidogrel 75 MG tablet Commonly known as:  PLAVIX   metoprolol succinate 25 MG 24 hr tablet Commonly known as:  TOPROL-XL     TAKE these medications   acetaminophen 500 MG tablet Commonly known as:  TYLENOL Take 500 mg by mouth every 6 (six) hours as needed (for pain).   amiodarone 200 MG tablet Commonly known as:  PACERONE Take 0.5 tablets (100 mg total) by mouth daily.   aspirin 81 MG chewable tablet Chew 1 tablet  (81 mg total) by mouth daily.   bisacodyl 10 MG suppository Commonly known as:  DULCOLAX Place 1 suppository (10 mg total) rectally daily as needed for moderate constipation.   carvedilol 3.125 MG tablet Commonly known as:  COREG Take 1 tablet (3.125 mg total) by mouth 2 (two) times daily with Allycia Pitz meal for 30 days.   cholecalciferol 1000 units tablet Commonly known as:  VITAMIN D Take 2,000 Units by mouth at bedtime.   meropenem  IVPB Commonly known as:  MERREM Inject 1 g into the vein every 8 (eight) hours for 13 days. Indication:  Cholecystitis/intra-abdominal infection Last Day of Therapy:  02/21/2019 Labs - Once weekly:  CBC/D and BMP, Labs - Every other week:  ESR and CRP   midodrine 5 MG tablet Commonly known as:  PROAMATINE Take 1 tablet (5 mg total) by mouth 3 (three) times daily with meals for 30 days.   nystatin-triamcinolone cream Commonly known as:  MYCOLOG II Apply 1 application topically See admin instructions. Apply to right and left buttocks twice daily for 7  days - stop date 01/25/19   pantoprazole 40 MG tablet Commonly known as:  PROTONIX Take 1 tablet (40 mg total) by mouth at bedtime for 30 days.   polyethylene glycol 17 g packet Commonly known as:  MIRALAX / GLYCOLAX Take 17 g by mouth every evening.   promethazine 25 MG/ML injection Commonly known as:  PHENERGAN Inject 12.5 mg into the muscle every 6 (six) hours as needed for nausea or vomiting.   triamcinolone 0.025 % cream Commonly known as:  KENALOG Apply 1 application topically 2 (two) times daily.   vitamin B-12 250 MCG tablet Commonly known as:  CYANOCOBALAMIN Take 1 tablet (250 mcg total) by mouth daily for 30 days. Start taking on:  February 09, 2019            Home Infusion Instuctions  (From admission, onward)         Start     Ordered   02/08/19 0000  Home infusion instructions [facility] May follow [place] Dosing Protocol; May administer Cathflo as needed to maintain  patency of vascular access device.; Flushing of vascular access device: per [facility] Protocol: 0.9% NaCl pre/post medica...    Question Answer Comment  Instructions May follow [place] Dosing Protocol   Instructions May administer Cathflo as needed to maintain patency of vascular access device.   Instructions Flushing of vascular access device: per [name] Protocol: 0.9% NaCl pre/post medication administration and prn patency; Heparin 100 u/ml, 58m for implanted ports and Heparin 10u/ml, 587mfor all other central venous catheters.   Instructions May follow [facility] Anaphylaxis Protocol for First Dose Administration in the home: 0.9% NaCl at 25-50 ml/hr to maintain IV access for protocol meds. Epinephrine 0.3 ml IV/IM PRN and Benadryl 25-50 IV/IM PRN s/s of anaphylaxis.   Instructions [facility] Infusion Coordinator (RN) to assist per patient IV care needs in the home PRN.      02/08/19 1230           Durable Medical Equipment  (From admission, onward)         Start     Ordered   02/08/19 1014  For home use only DME lightweight manual wheelchair with seat cushion  Once    Comments:  Patient suffers from weakness which impairs their ability to perform daily activities like walking in the home.  Lyriq Finerty rolling waker will not resolve  issue with performing activities of daily living. Rashid Whitenight wheelchair will allow patient to safely perform daily activities. Patient is not able to propel themselves in the home using Fue Cervenka standard weight wheelchair due to weakness. Patient can self propel in the lightweight wheelchair.   Accessories: elevating leg rests (ELRs), wheel locks, extensions and anti-tippers. Back cushion.   02/08/19 1016         Allergies  Allergen Reactions  . Crestor [Rosuvastatin] Other (See Comments)    Hurts muscles  . Lipitor [Atorvastatin] Other (See Comments)    Hurts stomach  . Shrimp [Shellfish Allergy] Other (See Comments)    On MAR   Follow-up Information    [facility]   Specialty:  Home Health Services Why:  Home health PT arranged Contact information: [phone]        [facility] Follow up.   Why:  IV antibiotic infusion       HeMarkus DaftMD Follow up in 3 month(s).   Specialties:  Interventional Radiology, Radiology  Why:  pt will hear from scheduler for follow up time and date; call [phone] if concerns or questions Contact information: [address] [place] [address] [phone]        [facility] Follow up.   Why:  Skilled Nursing services arranged Contact information: [phone]       [facility] Follow up.   Why:  Nurse Aide services arranged Contact information:  [address] [place], [place] [address]  O: [phone]            The results of significant diagnostics from this hospitalization (including imaging, microbiology, ancillary and laboratory) are listed below for reference.    Significant Diagnostic Studies: Dg Forearm Left  Result Date: 02/03/2019 CLINICAL DATA:  Left arm pain. EXAM: LEFT FOREARM - 2 VIEW COMPARISON:  None. FINDINGS: There is no evidence of fracture or other focal bone lesions. Vascular calcifications are noted. IMPRESSION: No significant abnormality seen in the left forearm. Electronically Signed   By: Marijo Conception M.D.   On: 02/03/2019 15:46   Dg Wrist 2 Views Left  Result Date: 02/05/2019 CLINICAL DATA:  Left wrist pain. EXAM: LEFT WRIST - 2 VIEW COMPARISON:  Radiographs of February 03, 2019. FINDINGS: There is no evidence of fracture or dislocation. Vascular calcifications are noted. Mild degenerative changes seen involving the first carpometacarpal joint as well as the scaphoid trapezoid space. IMPRESSION: Mild osteoarthritis as described above. No acute abnormality seen in the left wrist. Electronically Signed   By: Marijo Conception  M.D.   On: 02/05/2019 17:01   Ct Head Wo Contrast  Result Date: 02/02/2019 CLINICAL DATA:  85 year old who underwent sphincterotomy and bile duct stone removal on 01/29/2019, current history of sepsis secondary to cholecystitis and cholangitis with an indwelling cholecystostomy tube, now with acute mental status changes, possibly related to metabolic encephalopathy. EXAM: CT HEAD WITHOUT CONTRAST TECHNIQUE: Contiguous axial images were obtained from the base of the skull through the vertex without intravenous contrast. COMPARISON:  12/12/2018 and earlier. FINDINGS: Brain: Patient's head is turned to the LEFT. Severe cortical, deep and cerebellar atrophy, unchanged since December, 2019, but progressive since 2014. Moderate to severe changes of small vessel as severe changes of small vessel disease of the white matter diffusely, unchanged. Physiologic calcifications in the basal ganglia. No mass lesion. No midline shift. No acute hemorrhage or hematoma. No extra-axial fluid collections. No evidence of acute infarction. Vascular: Severe BILATERAL carotid siphon and vertebral artery atherosclerosis. Severe atherosclerosis involving MIDDLE cerebral arteries. 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 No hyperdense vessel. Skull: No skull fracture or other focal osseous abnormality involving the skull. Sinuses/Orbits: Visualized paranasal sinuses, bilateral mastoid air cells and bilateral middle ear cavities well-aerated. Benign calcified senile plaques in both eyes. Other: None. IMPRESSION: 1. No acute intracranial abnormality. 2. Severe generalized atrophy and moderate to severe chronic microvascular ischemic changes of the white matter. 3. 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 Electronically Signed   By: Evangeline Dakin M.D.   On: 02/02/2019 14:19   Mr Brain Wo Contrast  Result Date:  02/02/2019 CLINICAL DATA:  Altered mental status EXAM: MRI HEAD WITHOUT CONTRAST TECHNIQUE: Multiplanar, multiecho pulse sequences of the brain and surrounding structures were obtained without intravenous contrast. COMPARISON:  CT head 02/02/2019 FINDINGS: Brain: Image quality degraded by significant motion. Moderate to  advanced atrophy. Ventricular enlargement consistent with atrophy. Negative for acute infarct. Moderate chronic microvascular ischemic changes in the white matter. Small chronic infarcts in the right cerebellum. Negative for hemorrhage or mass. No midline shift or fluid collection Vascular: Normal arterial flow voids. Atherosclerotic ectasia of the basilar artery as noted previously. Skull and upper cervical spine: Negative Sinuses/Orbits: Paranasal sinuses clear.  Bilateral cataract surgery Other: None IMPRESSION: Atrophy and chronic ischemic change. No acute intracranial abnormality Image quality degraded by motion. Electronically Signed   By: Franchot Gallo M.D.   On: 02/02/2019 15:33   Ir Sinus/fist Tube Chk-non Gi  Result Date: 01/28/2019 INDICATION: 85 year old with cholecystostomy tube that was recently replaced. The retention suture has been pulled out of the skin. EXAM: DRAIN INJECTION WITH FLUOROSCOPY COMPARISON:  None. MEDICATIONS: None ANESTHESIA/SEDATION: None COMPLICATIONS: None immediate. TECHNIQUE: Drain was injected with contrast under fluoroscopy. 10 mL Omnipaque 300 was injected. PROCEDURE: The drain and surrounding skin was prepped with chlorhexidine. Karisa Nesser new 0-Prolene suture was used to secure the catheter to the skin. FINDINGS: Cholecystostomy tube remains within the gallbladder. Again noted are multiple filling defects compatible with gallstones. IMPRESSION: Cholecystostomy tube remains in the gallbladder. Drain was secured to skin with new suture. Electronically Signed   By: Markus Daft M.D.   On: 01/28/2019 17:47   Mr 3d Recon At Scanner  Result Date: 01/28/2019 CLINICAL  DATA:  85 year old male with history of acute cholecystitis in February 2025, complicated by sepsis and respiratory failure. Status post cholecystostomy drainage. Readmission for lethargy, fever, nausea and leukocytosis with newly increased liver function tests. EXAM: MRI ABDOMEN WITHOUT AND WITH CONTRAST (INCLUDING MRCP) TECHNIQUE: Multiplanar multisequence MR imaging of the abdomen was performed both before and after the administration of intravenous contrast. Heavily T2-weighted images of the biliary and pancreatic ducts were obtained, and three-dimensional MRCP images were rendered by post processing. CONTRAST:  9 mL of Gadavist. COMPARISON:  No prior abdominal MRI. CT the abdomen and pelvis 11/21/2018. FINDINGS: Comment: Today's study is limited by considerable patient motion. Lower chest: Small bilateral pleural effusions lying dependently. Signal void in the region of the aortic root related to prior TAVR procedure. Hepatobiliary: No discrete cystic or solid hepatic lesions are confidently identified on today's motion limited examination. MRCP images are largely nondiagnostic. However, other T2 weighted sequences demonstrate no definite intrahepatic biliary ductal dilatation. Dedra Matsuo percutaneous cholecystostomy tube is noted. Multiple filling defects within the gallbladder are compatible with multiple gallstones. Gallbladder does not appear overly distended, and there is no large volume of pericholecystic fluid noted at this time. Ilo Beamon trace amount of irregular T2 signal intensity is noted adjacent to the gallbladder, likely within normal limits given the indwelling percutaneous cholecystostomy tube. Innumerable filling defects are also noted within the common bile duct, compatible with choledocholithiasis. Common bile duct appears to measure up to 13 mm distally. Pancreas: Pancreatic atrophy. Multiple well-defined T1 hypointense, T2 hyperintense, nonenhancing lesions are noted throughout the body of the pancreas,  measuring up to 1.4 cm (axial image 25 of series 4). No pancreatic ductal dilatation. No peripancreatic fluid collections or inflammatory changes. Spleen:  Unremarkable. Adrenals/Urinary Tract: There are multiple T1 hypointense, T2 hyperintense, nonenhancing lesions in the kidneys bilaterally, compatible with simple cysts, largest of which is exophytic in the posterior aspect of the upper pole of the right kidney measuring 5 cm. No hydroureteronephrosis in the visualized portions of the abdomen. Bilateral adrenal glands are normal in appearance. Stomach/Bowel: Visualized portions are unremarkable. Vascular/Lymphatic: Aortic atherosclerosis with fusiform aneurysmal dilatation  of the infrarenal abdominal aorta which measures up to 4.2 x 5.2 cm. No lymphadenopathy identified in the abdomen. Other: No significant volume of ascites noted in the visualized portions of the peritoneal cavity. Musculoskeletal: No aggressive appearing lytic or blastic lesions noted in the visualized portions of the skeleton. IMPRESSION: 1. Choledocholithiasis with dilated common bile duct measuring up to 13 mm in the porta hepatis. Despite the presence of numerous stones in this mildly dilated duct, there is no intrahepatic biliary ductal dilatation noted at this time. 2. Cholelithiasis with percutaneous cholecystostomy tube in place. Minimal surrounding T2 hyperintensity adjacent to the gallbladder which is likely within normal limits for Latorya Bautch patient with an indwelling percutaneous cholecystostomy tube. 3. Aortic atherosclerosis with fusiform dilatation of the infrarenal abdominal aorta. 4. Small bilateral pleural effusions lying dependently. Electronically Signed   By: Vinnie Langton M.D.   On: 01/28/2019 08:04   Dg Chest Port 1 View  Result Date: 01/24/2019 CLINICAL DATA:  Tachypnea and possible sepsis EXAM: PORTABLE CHEST 1 VIEW COMPARISON:  12/12/2018 FINDINGS: Cardiac shadow is enlarged. Changes of prior TAVR are again seen. Aortic  calcifications are noted. The lungs are well aerated bilaterally. Mild diffuse interstitial changes are noted similar to that seen on the prior exam. Patchy atelectasis/early infiltrate is noted in the left base. No bony abnormality is seen. IMPRESSION: Patchy left basilar atelectasis/early infiltrate. Electronically Signed   By: Inez Catalina M.D.   On: 01/24/2019 20:10   Dg Shoulder Left  Result Date: 02/03/2019 CLINICAL DATA:  Left shoulder pain. EXAM: LEFT SHOULDER - 2+ VIEW COMPARISON:  Radiographs of October 10, 2018. FINDINGS: There is no evidence of fracture or dislocation. Moderate degenerative changes seen involving the left glenohumeral joint. Soft tissues are unremarkable. 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 No acute abnormality seen in the left shoulder. Electronically Signed   By: Marijo Conception M.D.   On: 02/03/2019 15:50   Dg Ercp Biliary & Pancreatic Ducts  Result Date: 01/29/2019 CLINICAL DATA:  85 year old male with choledocholithiasis undergoing ERCP. EXAM: ERCP TECHNIQUE: Multiple spot images obtained with the fluoroscopic device and submitted for interpretation post-procedure. FLUOROSCOPY TIME:  Fluoroscopy Time:  3 minutes 14 seconds. COMPARISON:  MRCP 01/27/2019. FINDINGS: Noheli Melder total of 5 intraoperative spot views are submitted for review. The images demonstrate Norvell Caswell flexible endoscope in the descending duodenum followed by wire cannulation of the common bile duct and cholangiogram. Multiple filling defects are present throughout the common bile duct consistent with choledocholithiasis. Subsequent images demonstrate sphincterotomy and balloon sweeping of the common duct. Masai Kidd pigtail drainage catheter is also evident in the right upper quadrant consistent with the recently placed percutaneous cholecystostomy tube. IMPRESSION: ERCP with sphincterotomy and balloon sweep of the common duct. These images were submitted for radiologic interpretation only.  Please see the procedural report for the amount of contrast and the fluoroscopy time utilized. Electronically Signed   By: Jacqulynn Cadet M.D.   On: 01/29/2019 11:27   Mr Abdomen Mrcp Moise Boring Contast  Result Date: 01/28/2019 CLINICAL DATA:  85 year old male with history of acute cholecystitis in February 5277, complicated by sepsis and respiratory failure. Status post cholecystostomy drainage. Readmission for lethargy, fever, nausea and leukocytosis with newly increased liver function tests. EXAM: MRI ABDOMEN WITHOUT AND WITH CONTRAST (INCLUDING MRCP) TECHNIQUE: Multiplanar multisequence MR imaging of the abdomen was performed both before and after the administration of intravenous contrast. Heavily T2-weighted images of the biliary and pancreatic ducts were obtained, and three-dimensional MRCP images were  rendered by post processing. CONTRAST:  9 mL of Gadavist. COMPARISON:  No prior abdominal MRI. CT the abdomen and pelvis 11/21/2018. FINDINGS: Comment: Today's study is limited by considerable patient motion. Lower chest: Small bilateral pleural effusions lying dependently. Signal void in the region of the aortic root related to prior TAVR procedure. Hepatobiliary: No discrete cystic or solid hepatic lesions are confidently identified on today's motion limited examination. MRCP images are largely nondiagnostic. However, other T2 weighted sequences demonstrate no definite intrahepatic biliary ductal dilatation. Joann Kulpa percutaneous cholecystostomy tube is noted. Multiple filling defects within the gallbladder are compatible with multiple gallstones. Gallbladder does not appear overly distended, and there is no large volume of pericholecystic fluid noted at this time. Linc Renne trace amount of irregular T2 signal intensity is noted adjacent to the gallbladder, likely within normal limits given the indwelling percutaneous cholecystostomy tube. Innumerable filling defects are also noted within the common bile duct, compatible  with choledocholithiasis. Common bile duct appears to measure up to 13 mm distally. Pancreas: Pancreatic atrophy. Multiple well-defined T1 hypointense, T2 hyperintense, nonenhancing lesions are noted throughout the body of the pancreas, measuring up to 1.4 cm (axial image 25 of series 4). No pancreatic ductal dilatation. No peripancreatic fluid collections or inflammatory changes. Spleen:  Unremarkable. Adrenals/Urinary Tract: There are multiple T1 hypointense, T2 hyperintense, nonenhancing lesions in the kidneys bilaterally, compatible with simple cysts, largest of which is exophytic in the posterior aspect of the upper pole of the right kidney measuring 5 cm. No hydroureteronephrosis in the visualized portions of the abdomen. Bilateral adrenal glands are normal in appearance. Stomach/Bowel: Visualized portions are unremarkable. Vascular/Lymphatic: Aortic atherosclerosis with fusiform aneurysmal dilatation of the infrarenal abdominal aorta which measures up to 4.2 x 5.2 cm. No lymphadenopathy identified in the abdomen. Other: No significant volume of ascites noted in the visualized portions of the peritoneal cavity. Musculoskeletal: No aggressive appearing lytic or blastic lesions noted in the visualized portions of the skeleton. IMPRESSION: 1. Choledocholithiasis with dilated common bile duct measuring up to 13 mm in the porta hepatis. Despite the presence of numerous stones in this mildly dilated duct, there is no intrahepatic biliary ductal dilatation noted at this time. 2. Cholelithiasis with percutaneous cholecystostomy tube in place. Minimal surrounding T2 hyperintensity adjacent to the gallbladder which is likely within normal limits for Ayra Hodgdon patient with an indwelling percutaneous cholecystostomy tube. 3. Aortic atherosclerosis with fusiform dilatation of the infrarenal abdominal aorta. 4. Small bilateral pleural effusions lying dependently. Electronically Signed   By: Vinnie Langton M.D.   On: 01/28/2019  08:04   Ir Exchange Biliary Drain  Result Date: 01/25/2019 INDICATION: 85 year old with biliary sepsis and history of Montravious Weigelt cholecystostomy tube. EXAM: CHOLECYSTOSTOMY TUBE EXCHANGE WITH FLUOROSCOPY CHOLANGIOGRAM THROUGH EXISTING CATHETER MEDICATIONS: None ANESTHESIA/SEDATION: None FLUOROSCOPY TIME:  Fluoroscopy Time: 1 minutes, 12 seconds, 9 mGy COMPLICATIONS: None immediate. PROCEDURE: Informed written consent was obtained from the patient after Kaela Beitz thorough discussion of the procedural risks, benefits and alternatives. All questions were addressed. Maximal Sterile Barrier Technique was utilized including caps, mask, sterile gowns, sterile gloves, sterile drape, hand hygiene and skin antiseptic. Latrena Benegas timeout was performed prior to the initiation of the procedure. The existing gallbladder drainage catheter was prepped and draped in Maylee Bare sterile fashion. The retention suture was still intact. It was difficult to flush the catheter and the tube appeared to be partially obstructed. Purulent looking fluid was draining around the catheter during injection of the tube. The tube was found to be within the gallbladder. The retention suture  was removed. The catheter was cut. The catheter was removed over Kristene Liberati stiff Amplatz wire. Huxley Vanwagoner new 12 [place] multipurpose drain was easily advanced over the wire and reconstituted in the gallbladder. Approximately 40 mL of light brown purulent looking fluid was removed from the gallbladder. Small amount of contrast was injected through the gallbladder drain. Drain was flushed with normal saline. Skin was anesthetized with 1% lidocaine and sutured to skin with Prolene suture. FINDINGS: Old catheter was well positioned in the gallbladder but the tube appeared to be partially obstructed. Greater than 40 mL of brown purulent looking fluid was removed from the gallbladder after the drain exchange. Fluid was sent for culture. Cholangiogram demonstrated partial filling of the cystic duct but no filling of  the common bile duct. Innumerable gallstones. IMPRESSION: 1. Successful exchange of the cholecystostomy tube. 2. Innumerable gallstones. Unable to opacify the common bile duct due to obstruction of the cystic duct. 3. Purulent looking fluid removed from the gallbladder. Fluid sent for culture. Electronically Signed   By: Markus Daft M.D.   On: 01/25/2019 17:11   US Abdomen Limited Ruq  Result Date: 01/24/2019 CLINICAL DATA:  85 y/o  M; transaminitis. EXAM: ULTRASOUND ABDOMEN LIMITED RIGHT UPPER QUADRANT COMPARISON:  11/21/2018 CT abdomen and pelvis and abdominal ultrasound. 11/22/2018, 12/18/2018, 01/05/2019 interventional radiology cholecystostomy and cholecystostomy exchanges. FINDINGS: Gallbladder: Cholelithiasis with the largest stone measuring 9 mm near the gallbladder neck. Gallbladder wall thickening to 4.9 mm. Negative sonographic Murphy's sign. No pericholecystic fluid. Linear echogenic structure likely representing the cholecystostomy tube is partially visualized within gallbladder fossa, limited evaluation due to overlying bandaging. Common bile duct: Diameter: 7.3 mm Liver: No focal lesion identified. Within normal limits in parenchymal echogenicity. Portal vein is patent on color Doppler imaging with normal direction of blood flow towards the liver. IMPRESSION: 1. Exam limited due to overlying bandaging in the right upper quadrant. 2. Mild gallbladder wall thickening, probably chronic sequelae of cholecystitis and cholecystostomy tube. Negative sonographic Murphy's sign. Cholelithiasis again noted. 3. No significant intra or extrahepatic biliary ductal dilatation identified. Electronically Signed   By: Kristine Garbe M.D.   On: 01/24/2019 22:25    Microbiology: No results found for this or any previous visit (from the past 240 hour(s)).   Labs: Basic Metabolic Panel: Recent Labs  Lab 02/04/19 0350 02/05/19 0626 02/06/19 0242 02/07/19 0255 02/08/19 0327  NA 135 137 136 135 135   K 3.4* 3.3* 3.6 4.0 3.3*  CL 104 104 106 104 101  CO2 '22 22 22 ' 21* 24  GLUCOSE 132* 121* 125* 119* 107*  BUN '14 18 17 16 16  ' CREATININE 1.02 0.88 0.88 0.95 0.90  CALCIUM 8.3* 8.2* 8.2* 8.4* 8.5*  MG  --  2.0 2.0 2.0 2.1   Liver Function Tests: Recent Labs  Lab 02/04/19 0350 02/05/19 0626 02/06/19 0242 02/07/19 0255 02/08/19 0327  AST '17 23 30 ' 33 26  ALT '17 19 24 25 24  ' ALKPHOS 139* 128* 120 121 114  BILITOT 1.1 0.8 0.9 0.9 1.2  PROT 6.3* 5.6* 5.8* 6.0* 5.9*  ALBUMIN 2.2* 2.0* 2.1* 2.2* 2.2*   No results for input(s): LIPASE, AMYLASE in the last 168 hours. Recent Labs  Lab 02/03/19 0349  AMMONIA 20   CBC: Recent Labs  Lab 02/03/19 0349 02/05/19 0626 02/06/19 0242 02/07/19 0255 02/08/19 0327  WBC 10.4 6.2 5.8 5.6 5.7  HGB 12.7* 11.0* 11.3* 12.5* 11.9*  HCT 36.1* 32.4* 32.4* 37.2* 33.8*  MCV 88.0 89.8 89.8 93.5 88.3  PLT 163 131* 138* 153 130*   Cardiac Enzymes: No results for input(s): CKTOTAL, CKMB, CKMBINDEX, TROPONINI in the last 168 hours. BNP: BNP (last 3 results) Recent Labs    07/02/18 1819 08/13/18 0409 11/21/18 0851  BNP 696.8* 459.6* 332.5*    ProBNP (last 3 results) No results for input(s): PROBNP in the last 8760 hours.  CBG: Recent Labs  Lab 02/07/19 2024 02/08/19 0005 02/08/19 0413 02/08/19 0743 02/08/19 1126  GLUCAP 167* 89 101* 122* 152*       Signed:  Fayrene Helper MD.  [facility] 02/08/2019, 1:22 PM

## 2019-02-08 NOTE — TOC Transition Note (Addendum)
Transition of Care [facility]) - CM/SW Discharge Note   Patient Details  Name: William [name] MRN: [id number] Date of Birth: 1928-06-19  Transition of Care [facility]) CM/SW Contact:  Sharin Mons, RN Phone Number: 02/08/2019, 11:53 AM  02/08/2019 @ 4:30 pm NCM called and arranged pt's transportation to home with  [facility] , pt/wife and nurse aware.   Clinical Narrative:    Transition to home with home health services. [facility] to provide PT,OT, SW services. [facility]/ [facility] [phone]) will provide ongoing RN services per wife's request. D/C summary will be faxed to [facility] @ [email or web address] per NCM. [facility], family to privately pay for NA. Wheelchair will be provide to wife @ bedside prior to d/c.  [facility] [phone]) to provide transportation to home when pt ready, NCM made wife and bedside aware.  Final next level of care: home with home health services Barriers to Discharge: Barriers Resolved   Patient Goals and CMS Choice Patient states their goals for this hospitalization and ongoing recovery are:: Feeling better CMS Medicare.gov Compare Post Acute Care list provided to:: Other (Comment Required)(Already at SNF) Choice offered to / list presented to : Spouse  Discharge Placement                       Discharge Plan and Services In-house Referral: Clinical Social Work Discharge Planning Services: NA Post Acute Care Choice: [facility]          DME Arranged: Wheelchair manual(light [employer] W/C with back cushion) DME Agency: [employer]       HH Arranged: NA HH Agency: NA        Social Determinants of Health (SDOH) Interventions     Readmission Risk Interventions No flowsheet data found.

## 2019-02-09 DIAGNOSIS — H9190 Unspecified hearing loss, unspecified ear: Secondary | ICD-10-CM | POA: Diagnosis not present

## 2019-02-09 DIAGNOSIS — Z792 Long term (current) use of antibiotics: Secondary | ICD-10-CM | POA: Diagnosis not present

## 2019-02-09 DIAGNOSIS — J45909 Unspecified asthma, uncomplicated: Secondary | ICD-10-CM | POA: Diagnosis not present

## 2019-02-09 DIAGNOSIS — G4730 Sleep apnea, unspecified: Secondary | ICD-10-CM | POA: Diagnosis not present

## 2019-02-09 DIAGNOSIS — R739 Hyperglycemia, unspecified: Secondary | ICD-10-CM

## 2019-02-09 DIAGNOSIS — I051 Rheumatic mitral insufficiency: Secondary | ICD-10-CM | POA: Diagnosis not present

## 2019-02-09 DIAGNOSIS — I714 Abdominal aortic aneurysm, without rupture: Secondary | ICD-10-CM | POA: Diagnosis not present

## 2019-02-09 DIAGNOSIS — F05 Delirium due to known physiological condition: Secondary | ICD-10-CM | POA: Diagnosis not present

## 2019-02-09 DIAGNOSIS — Z8546 Personal history of malignant neoplasm of prostate: Secondary | ICD-10-CM | POA: Diagnosis not present

## 2019-02-09 DIAGNOSIS — I5022 Chronic systolic (congestive) heart failure: Secondary | ICD-10-CM | POA: Diagnosis not present

## 2019-02-09 DIAGNOSIS — E785 Hyperlipidemia, unspecified: Secondary | ICD-10-CM | POA: Diagnosis not present

## 2019-02-09 DIAGNOSIS — K8031 Calculus of bile duct with cholangitis, unspecified, with obstruction: Secondary | ICD-10-CM | POA: Diagnosis not present

## 2019-02-09 DIAGNOSIS — I472 Ventricular tachycardia: Secondary | ICD-10-CM | POA: Diagnosis not present

## 2019-02-09 DIAGNOSIS — A4181 Sepsis due to Enterococcus: Secondary | ICD-10-CM | POA: Diagnosis not present

## 2019-02-09 DIAGNOSIS — G9341 Metabolic encephalopathy: Secondary | ICD-10-CM | POA: Diagnosis not present

## 2019-02-09 DIAGNOSIS — I429 Cardiomyopathy, unspecified: Secondary | ICD-10-CM | POA: Diagnosis not present

## 2019-02-09 DIAGNOSIS — Z952 Presence of prosthetic heart valve: Secondary | ICD-10-CM | POA: Diagnosis not present

## 2019-02-09 DIAGNOSIS — B9689 Other specified bacterial agents as the cause of diseases classified elsewhere: Secondary | ICD-10-CM | POA: Diagnosis not present

## 2019-02-09 DIAGNOSIS — R918 Other nonspecific abnormal finding of lung field: Secondary | ICD-10-CM | POA: Diagnosis not present

## 2019-02-09 DIAGNOSIS — I2510 Atherosclerotic heart disease of native coronary artery without angina pectoris: Secondary | ICD-10-CM | POA: Diagnosis not present

## 2019-02-09 DIAGNOSIS — Z7901 Long term (current) use of anticoagulants: Secondary | ICD-10-CM | POA: Diagnosis not present

## 2019-02-09 DIAGNOSIS — F0390 Unspecified dementia without behavioral disturbance: Secondary | ICD-10-CM | POA: Diagnosis not present

## 2019-02-09 DIAGNOSIS — M19012 Primary osteoarthritis, left shoulder: Secondary | ICD-10-CM | POA: Diagnosis not present

## 2019-02-09 DIAGNOSIS — I0981 Rheumatic heart failure: Secondary | ICD-10-CM

## 2019-02-09 DIAGNOSIS — M19032 Primary osteoarthritis, left wrist: Secondary | ICD-10-CM | POA: Diagnosis not present

## 2019-02-10 ENCOUNTER — Telehealth: Payer: Self-pay

## 2019-02-10 NOTE — Telephone Encounter (Signed)
Received call from daughter Ebony Hail to reschedule [place] appointment. F/U appointment scheduled with Dr. Tommy Medal 02/11/19. LPN called [facility] and left message to fax any labs that may have been drawn. [facility] can be reached at [phone]. Orders noted in MD box for weekly labs due on Sundays or Mondays CBC/D, BMP and biweekly labs ESR, CRP.  Eugenia Mcalpine, LPN

## 2019-02-11 ENCOUNTER — Encounter: Payer: Self-pay | Admitting: Infectious Disease

## 2019-02-11 ENCOUNTER — Ambulatory Visit (INDEPENDENT_AMBULATORY_CARE_PROVIDER_SITE_OTHER): Payer: Medicare Other | Admitting: Infectious Disease

## 2019-02-11 ENCOUNTER — Other Ambulatory Visit: Payer: Self-pay

## 2019-02-11 DIAGNOSIS — B9689 Other specified bacterial agents as the cause of diseases classified elsewhere: Secondary | ICD-10-CM | POA: Diagnosis not present

## 2019-02-11 DIAGNOSIS — R7881 Bacteremia: Secondary | ICD-10-CM

## 2019-02-11 DIAGNOSIS — I428 Other cardiomyopathies: Secondary | ICD-10-CM | POA: Diagnosis not present

## 2019-02-11 DIAGNOSIS — I330 Acute and subacute infective endocarditis: Secondary | ICD-10-CM | POA: Diagnosis not present

## 2019-02-11 DIAGNOSIS — Z95810 Presence of automatic (implantable) cardiac defibrillator: Secondary | ICD-10-CM

## 2019-02-11 DIAGNOSIS — T827XXA Infection and inflammatory reaction due to other cardiac and vascular devices, implants and grafts, initial encounter: Secondary | ICD-10-CM | POA: Diagnosis not present

## 2019-02-11 DIAGNOSIS — I714 Abdominal aortic aneurysm, without rupture, unspecified: Secondary | ICD-10-CM

## 2019-02-11 DIAGNOSIS — Z952 Presence of prosthetic heart valve: Secondary | ICD-10-CM

## 2019-02-11 DIAGNOSIS — F0390 Unspecified dementia without behavioral disturbance: Secondary | ICD-10-CM

## 2019-02-11 DIAGNOSIS — B952 Enterococcus as the cause of diseases classified elsewhere: Secondary | ICD-10-CM | POA: Diagnosis not present

## 2019-02-11 DIAGNOSIS — B961 Klebsiella pneumoniae [K. pneumoniae] as the cause of diseases classified elsewhere: Secondary | ICD-10-CM

## 2019-02-11 MED ORDER — AMOXICILLIN-POT CLAVULANATE 875-125 MG PO TABS: 1.0000 | ORAL_TABLET | Freq: Two times a day (BID) | ORAL | 11 refills | Status: DC

## 2019-02-11 MED ORDER — SULFAMETHOXAZOLE-TRIMETHOPRIM 800-160 MG PO TABS: 1.0000 | ORAL_TABLET | Freq: Two times a day (BID) | ORAL | 11 refills | Status: DC

## 2019-02-11 NOTE — Progress Notes (Signed)
Virtual Visit via Telephone Note  I connected with William [name] on 29/56/21 at  9:45 AM EDT by telephone and verified that I am speaking with the correct person using two identifiers.  Location: Patient: Home Provider: [facility]   I discussed the limitations, risks, security and privacy concerns of performing an evaluation and management service by telephone and the availability of in person appointments. I also discussed with the patient that there may be a patient responsible charge related to this service. The patient expressed understanding and agreed to proceed.   History of Present Illness:  85 y.o. male with recurrent episodes of cholangitis and sepsis at times with bacteremias including in time this fall when he had to have his ICD removed, again remitted admitted with cholangitis and sepsis in the context of an obstructed cholecystostomy tube, and concern for potential obstruction of his biliary tree due to stones.  He was found not to be a surgical candidate which seems quite reasonable to me given his multiple comorbidities including his dementia.  GI were able to remove multiple stones during his admission and cultures from the biliary tree yielded ampicillin sensitive enterococcal species and an Enterobacter species.  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  Patient was met with with palliative care on multiple occasions but his family continued to want "aggressive care".  They have been seeming to want to have a second opinion from a surgeon which seems not very realistic especially in the current environment.  Ultimately arrangements were made for him to continue on the meropenem through May 10.  Today I had a lengthy discussion with the patient his wife and 2 daughters about the fact that he was coming to the end of his life.  I again explained to him that absent of surgery he was going to have recurrent episodes of  sepsis from the biliary tree given that there is still a large stone in the gallbladder that GI could not remove.  They continue to think that another surgeon might operate.  I told him that I felt that almost all surgeons that could be asked for second opinion would not be enthusiastic about operating on this 85 year old man with multiple comorbidities and known dementia.  I explained also that even anesthesia could potentially impact his further quality of life and potentially worsen his dementia among other things.  Regardless certainly they can consider getting a second opinion but I told him again that this was almost impossible to happen the current circumstances.  I also pointed out that by continuing to have him at home with home health nurses visiting him he was not only being put at risk himself of get acquiring novel coronavirus 12/02/2017 but also out of his wife who is also in her 83s and his 2 daughters.  His wife and daughters accept this risk.  I also pointed out though that this risk extend outside her family to others who might not be so accepting of this potential risk for infection.  After a lengthy discussion which lasted more than 40 minutes ultimately we decided to continue his antibiotics through the stop date which is either the 10th of the eighth.  And then I offered to place him on indefinite Bactrim and Augmentin which would cover the most recent pathogens isolated from his biliary tree but not the ESBL that he had previously.      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  I again offered to place him on oral antibiotics to cover the most recent pathogens isolated after he completed his meropenem namely Bactrim double strength twice daily and Augmentin twice daily.  We will check a BMP 1 week after he starts his Bactrim therapy.  Goals of care: Again I feel that his care is unnecessarily aggressive though I understand family did not want to let go of their husband and father I do think there is an order amount of risk being undertaken without clear long-term achievable goal here beyond prolong his life for a few weeks to months.  Prevention of novel coronavirus 2019: This will be a significant risk in this patient with multiple admissions to the hospital and high likelihood for recurrent infection and readmission.     I discussed the assessment and treatment plan with the patient. The patient was provided an opportunity to ask questions and all were answered. The patient agreed with the plan and demonstrated an understanding of the instructions.   The patient was advised to call back or seek an in-person evaluation if the symptoms worsen or if the condition fails to improve as anticipated.  I provided 45 minutes of non-face-to-face time during this encounter.   Alcide Evener, MD

## 2019-02-11 NOTE — Progress Notes (Signed)
Thanks for having the hard discussion with them . They are holding on beyond reasonable expectation, I agree. Rema Lievanos

## 2019-02-12 ENCOUNTER — Telehealth: Payer: Self-pay | Admitting: Diagnostic Radiology

## 2019-02-12 DIAGNOSIS — G9341 Metabolic encephalopathy: Secondary | ICD-10-CM | POA: Diagnosis not present

## 2019-02-12 DIAGNOSIS — F05 Delirium due to known physiological condition: Secondary | ICD-10-CM | POA: Diagnosis not present

## 2019-02-12 DIAGNOSIS — A4181 Sepsis due to Enterococcus: Secondary | ICD-10-CM | POA: Diagnosis not present

## 2019-02-12 DIAGNOSIS — F0390 Unspecified dementia without behavioral disturbance: Secondary | ICD-10-CM | POA: Diagnosis not present

## 2019-02-12 DIAGNOSIS — B9689 Other specified bacterial agents as the cause of diseases classified elsewhere: Secondary | ICD-10-CM | POA: Diagnosis not present

## 2019-02-12 DIAGNOSIS — K8031 Calculus of bile duct with cholangitis, unspecified, with obstruction: Secondary | ICD-10-CM | POA: Diagnosis not present

## 2019-02-12 NOTE — Progress Notes (Signed)
I spoke to daughter about follow up care for the cholecystostomy tube.  At this point, we are just performing routine tube exchanges and no plans for surgery.  Family feels more comfortable with shorter time intervals between tube exchanges at this time due to recent infection.  We will schedule for tube injection and exchange in 4-6 weeks.

## 2019-02-15 ENCOUNTER — Telehealth: Payer: Self-pay

## 2019-02-15 DIAGNOSIS — F0150 Vascular dementia without behavioral disturbance: Secondary | ICD-10-CM

## 2019-02-15 DIAGNOSIS — C61 Malignant neoplasm of prostate: Secondary | ICD-10-CM | POA: Diagnosis not present

## 2019-02-15 DIAGNOSIS — K819 Cholecystitis, unspecified: Secondary | ICD-10-CM | POA: Diagnosis not present

## 2019-02-15 DIAGNOSIS — R54 Age-related physical debility: Secondary | ICD-10-CM

## 2019-02-15 DIAGNOSIS — R531 Weakness: Secondary | ICD-10-CM

## 2019-02-15 DIAGNOSIS — A4181 Sepsis due to Enterococcus: Secondary | ICD-10-CM | POA: Diagnosis not present

## 2019-02-15 DIAGNOSIS — K8031 Calculus of bile duct with cholangitis, unspecified, with obstruction: Secondary | ICD-10-CM | POA: Diagnosis not present

## 2019-02-15 DIAGNOSIS — F0390 Unspecified dementia without behavioral disturbance: Secondary | ICD-10-CM | POA: Diagnosis not present

## 2019-02-15 DIAGNOSIS — G9341 Metabolic encephalopathy: Secondary | ICD-10-CM | POA: Diagnosis not present

## 2019-02-15 DIAGNOSIS — F05 Delirium due to known physiological condition: Secondary | ICD-10-CM | POA: Diagnosis not present

## 2019-02-15 DIAGNOSIS — B9689 Other specified bacterial agents as the cause of diseases classified elsewhere: Secondary | ICD-10-CM | POA: Diagnosis not present

## 2019-02-15 NOTE — Telephone Encounter (Signed)
I do not recall seeing that and I don't see any phone calls about it.  I am fine with signing an order for a hoyer lift for him though.

## 2019-02-15 NOTE — Telephone Encounter (Signed)
Ignacia Marvel with [facility] called requesting status of fax for [employer] (sent last week). Hoyer Lift order should be faxed to [facility].  I advised that I will check with Dr.Reed to see if she recalls receiving an order on patient. Patient is skilled at [facility] and order possibly faxed there for completion. Rob aware I will call him back with a status update after consulting with Dr.Reed.

## 2019-02-15 NOTE — Telephone Encounter (Signed)
Order pending for diagnosis associating

## 2019-02-16 ENCOUNTER — Ambulatory Visit: Payer: Medicare Other | Admitting: Gastroenterology

## 2019-02-16 ENCOUNTER — Encounter: Payer: Self-pay | Admitting: General Surgery

## 2019-02-16 DIAGNOSIS — B9689 Other specified bacterial agents as the cause of diseases classified elsewhere: Secondary | ICD-10-CM | POA: Diagnosis not present

## 2019-02-16 DIAGNOSIS — K8031 Calculus of bile duct with cholangitis, unspecified, with obstruction: Secondary | ICD-10-CM | POA: Diagnosis not present

## 2019-02-16 DIAGNOSIS — A4181 Sepsis due to Enterococcus: Secondary | ICD-10-CM | POA: Diagnosis not present

## 2019-02-16 DIAGNOSIS — G9341 Metabolic encephalopathy: Secondary | ICD-10-CM | POA: Diagnosis not present

## 2019-02-16 DIAGNOSIS — F0390 Unspecified dementia without behavioral disturbance: Secondary | ICD-10-CM | POA: Diagnosis not present

## 2019-02-16 DIAGNOSIS — F05 Delirium due to known physiological condition: Secondary | ICD-10-CM | POA: Diagnosis not present

## 2019-02-16 MED ORDER — UNABLE TO FIND: 0 refills | Status: DC

## 2019-02-16 NOTE — Telephone Encounter (Signed)
Order faxed to [facility] as [facility] @ [phone]

## 2019-02-16 NOTE — Telephone Encounter (Signed)
Order printed at office to be [facility] to [facility] who can see it in epic anyway.  Too bad we can't electronically fax these to them.

## 2019-02-17 ENCOUNTER — Ambulatory Visit (INDEPENDENT_AMBULATORY_CARE_PROVIDER_SITE_OTHER): Payer: Medicare Other | Admitting: Gastroenterology

## 2019-02-17 ENCOUNTER — Other Ambulatory Visit: Payer: Self-pay

## 2019-02-17 VITALS — Ht 70.0 in

## 2019-02-17 DIAGNOSIS — K810 Acute cholecystitis: Secondary | ICD-10-CM

## 2019-02-17 NOTE — Patient Instructions (Signed)
The patient's daughter understands that I will be reaching out to Dr. Rush Landmark about his situation and get his opinion on any further interventional therapies that might help.

## 2019-02-17 NOTE — Progress Notes (Signed)
Review of pertinent gastrointestinal problems: 1.  Complicated biliary disease; acute cholecystitis February 2020, he was felt to be a poor surgical candidate and so underwent cholecystostomy drain placement via IR.  He was critically ill with gram-negative bacteremia, sepsis.  Clinically improved until April 2020 when he we presented with decreased drain output, elevated liver tests, again gram-negative rods and also gram-positive cocci in his blood.  Drain study showed his cholecystostomy drain was occluded, cystic duct remained obstructed, there was purulence in his gallbladder.  Eventual MRI with MRCP showed multiple common bile duct stones also stones in his gallbladder.  ERCP Dr. Ardis Hughs January 29, 2019 biliary sphincterotomy, removed multiple black bile duct stones, cystic duct not opacified.  The bile duct appeared clear following the ERCP.  Surgery input again felt he was not a surgical candidate.  Came somewhat encephalopathic following ERCP. he was discharged home on meropenem and he is being followed by infectious disease.  This service was provided via virtual visit.   Only audio was used.  The patient was located at home.  I was located in my office.  The patient did consent to this virtual visit and is aware of possible charges through their insurance for this visit.  His daughter was on the phone and did most of the history giving certainly most of the talking.  The patient is an established patient.  My certified medical assistant, Grace Bushy, contributed to this visit by contacting the patient by phone 1 or 2 business days prior to the appointment and also followed up on the recommendations I made after the visit.  Time spent on virtual visit: 45 min   HPI: This is a very pleasantly demented 85 year old man who I last saw at the time of an ERCP last month.  His daughter was on the phone for most of the visit today.  She briefly brought the phone into where her father was and he was able to  tell me he was not in any pain and he was eating okay.   She tells me that he he's been sleeping more, less conversational recently.  He also has had a very low-grade temperature, but this morning it was just 97.  He is on meropenum at home.  This is due to run out in 2 days.  They spoke with Dr. Tommy Medal recently and he feels that the patient is likely that he will have recurrent infections.  I reviewed his note and he was planning to offer them twice daily Augmentin and Bactrim double strength twice daily as well after the meropenem runs out.  His daughter had numerous questions and statements about her father's care.  Chief complaint is complex biliary disease  ROS: complete GI ROS as described in HPI, all other review negative.  Constitutional:  No unintentional weight loss   Past Medical History:  Diagnosis Date  . AAA (abdominal aortic aneurysm) ([place])    7/13 3.8cm  . Arthritis    "joints" (01/11/2014)  . Asthma    "seasonal; some foods"   . Chronic systolic CHF (congestive heart failure) ([place])   . Dementia ([place])   . HLD (hyperlipidemia)   . HOH (hard of hearing)   . Lumbar vertebral fracture ([facility])    L1- 04/11/2014   . Prostate cancer ([employer])   . S/P ICD (internal cardiac defibrillator) procedure, 01/11/14 removal of ERI gen and placement of Medtronic Evera XT VR & NEW Right ventricular lead Medtronic 01/12/2014  . S/P TAVR (transcatheter aortic  valve replacement)    Edwards Sapien 3 THV (size 29 mm, model # [id number], serial # [id number])  . Severe aortic stenosis   . Sleep apnea    "lost 60# & don't have it anymore" (01/11/2014)  . Ventricular tachycardia [facility])     Past Surgical History:  Procedure Laterality Date  . CARDIAC CATHETERIZATION  08/20/2004   noncritical CAD,mild global hypokinesis, EF 50%  . CARDIAC DEFIBRILLATOR PLACEMENT  08/23/2004   Medtronic  . CATARACT EXTRACTION W/ INTRAOCULAR LENS IMPLANT Right   . COLECTOMY  1990's  . CRYOABLATION N/A 02/24/2014    Procedure: CRYO ABLATION PROSTATE;  Surgeon: Ailene Rud, MD;  Location: [facility] ORS;  Service: Urology;  Laterality: N/A;  . ERCP N/A 01/29/2019   Procedure: ENDOSCOPIC RETROGRADE CHOLANGIOPANCREATOGRAPHY (ERCP);  Surgeon: Milus Banister, MD;  Location: [facility] ENDOSCOPY;  Service: Endoscopy;  Laterality: N/A;  . HERNIA REPAIR     "abdomen; from colon OR"  . ICD LEAD REMOVAL N/A 09/15/2018   Procedure: ICD LEAD REMOVAL EXTRACTION;  Surgeon: Evans Lance, MD;  Location: [facility] OR;  Service: Cardiovascular;  Laterality: N/A;  DR. BARTLE TO BACK UP  . IMPLANTABLE CARDIOVERTER DEFIBRILLATOR (ICD) GENERATOR CHANGE N/A 01/11/2014   Procedure: ICD GENERATOR CHANGE;  Surgeon: Sanda Klein, MD;  Location: [place] CATH LAB;  Service: Cardiovascular;  Laterality: N/A;  . IR EXCHANGE BILIARY DRAIN  12/18/2018  . IR EXCHANGE BILIARY DRAIN  01/05/2019  . IR EXCHANGE BILIARY DRAIN  01/25/2019  . IR PERC CHOLECYSTOSTOMY  11/22/2018  . IR SINUS/FIST TUBE CHK-NON GI  01/28/2019  . LEAD REVISION N/A 01/11/2014   Procedure: LEAD REVISION;  Surgeon: Sanda Klein, MD;  Location: [place] CATH LAB;  Service: Cardiovascular;  Laterality: N/A;  . NM MYOCAR PERF WALL MOTION  01/29/2012   abnormal c/o infarct/scar,no ischemia present  . PROSTATE BIOPSY N/A 11/22/2013   Procedure: PROSTATE BIOPSY AND ULTRASOUND;  Surgeon: Ailene Rud, MD;  Location: [facility]S;  Service: Urology;  Laterality: N/A;  . REMOVAL OF STONES  01/29/2019   Procedure: REMOVAL OF STONES;  Surgeon: Milus Banister, MD;  Location: [facility] ENDOSCOPY;  Service: Endoscopy;;  . RIGHT/LEFT HEART CATH AND CORONARY ANGIOGRAPHY N/A 07/06/2018   Procedure: RIGHT/LEFT HEART CATH AND CORONARY ANGIOGRAPHY;  Surgeon: Troy Sine, MD;  Location: [place] CV LAB;  Service: Cardiovascular;  Laterality: N/A;  . SPHINCTEROTOMY  01/29/2019   Procedure: SPHINCTEROTOMY;  Surgeon: Milus Banister, MD;  Location[facility] ENDOSCOPY;  Service: Endoscopy;;  . TEE WITHOUT CARDIOVERSION N/A  08/18/2018   Procedure: TRANSESOPHAGEAL ECHOCARDIOGRAM (TEE);  Surgeon: Burnell Blanks, MD;  Location: [place];  Service: Open Heart Surgery;  Laterality: N/A;  . TEE WITHOUT CARDIOVERSION N/A 09/08/2018   Procedure: TRANSESOPHAGEAL ECHOCARDIOGRAM (TEE);  Surgeon: Lelon Perla, MD;  Location: [facility] ENDOSCOPY;  Service: Cardiovascular;  Laterality: N/A;  . TEE WITHOUT CARDIOVERSION N/A 09/15/2018   Procedure: TRANSESOPHAGEAL ECHOCARDIOGRAM (TEE);  Surgeon: Evans Lance, MD;  Location: [facility] OR;  Service: Cardiovascular;  Laterality: N/A;  . TEE WITHOUT CARDIOVERSION N/A 11/26/2018   Procedure: TRANSESOPHAGEAL ECHOCARDIOGRAM (TEE);  Surgeon: Jerline Pain, MD;  Location: [facility] ENDOSCOPY;  Service: Cardiovascular;  Laterality: N/A;  . TRANSCATHETER AORTIC VALVE REPLACEMENT, TRANSFEMORAL  08/18/2018  . TRANSCATHETER AORTIC VALVE REPLACEMENT, TRANSFEMORAL N/A 08/18/2018   Procedure: TRANSCATHETER AORTIC VALVE REPLACEMENT, TRANSFEMORAL using an Edwards 13m Aortic Valve;  Surgeon: MBurnell Blanks MD;  Location: [facility]  Service: Open Heart Surgery;  Laterality: N/A;    Current  Outpatient Medications  Medication Sig Dispense Refill  . acetaminophen (TYLENOL) 500 MG tablet Take 500 mg by mouth every 6 (six) hours as needed (for pain).     Marland Kitchen amiodarone (PACERONE) 200 MG tablet Take 0.5 tablets (100 mg total) by mouth daily. 45 tablet 3  . amoxicillin-clavulanate (AUGMENTIN) 875-125 MG tablet Take 1 tablet by mouth 2 (two) times daily. 60 tablet 11  . aspirin 81 MG chewable tablet Chew 1 tablet (81 mg total) by mouth daily. 90 tablet 3  . bisacodyl (DULCOLAX) 10 MG suppository Place 1 suppository (10 mg total) rectally daily as needed for moderate constipation. 12 suppository 0  . carvedilol (COREG) 3.125 MG tablet Take 1 tablet (3.125 mg total) by mouth 2 (two) times daily with a meal for 30 days. 60 tablet 0  . cholecalciferol (VITAMIN D) 1000 units tablet Take 2,000 Units by mouth at  bedtime.    . meropenem (MERREM) IVPB Inject 1 g into the vein every 8 (eight) hours for 13 days. Indication:  Cholecystitis/intra-abdominal infection Last Day of Therapy:  02/21/2019 Labs - Once weekly:  CBC/D and BMP, Labs - Every other week:  ESR and CRP 13 Units 0  . midodrine (PROAMATINE) 5 MG tablet Take 1 tablet (5 mg total) by mouth 3 (three) times daily with meals for 30 days. 90 tablet 0  . nystatin-triamcinolone (MYCOLOG II) cream Apply 1 application topically See admin instructions. Apply to right and left buttocks twice daily for 7 days - stop date 01/25/19    . pantoprazole (PROTONIX) 40 MG tablet Take 1 tablet (40 mg total) by mouth at bedtime for 30 days. 30 tablet 0  . polyethylene glycol (MIRALAX / GLYCOLAX) packet Take 17 g by mouth every evening. 14 each 1  . promethazine (PHENERGAN) 25 MG/ML injection Inject 12.5 mg into the muscle every 6 (six) hours as needed for nausea or vomiting.    . sodium chloride flush (NS) 0.9 % SOLN flush drain once daily with 5-10 cc sterile saline Record output daily 300 mL 0  . sulfamethoxazole-trimethoprim (BACTRIM DS) 800-160 MG tablet Take 1 tablet by mouth 2 (two) times daily. (Patient not taking: Reported on 02/16/2019) 60 tablet 11  . triamcinolone (KENALOG) 0.025 % cream Apply 1 application topically 2 (two) times daily.     Marland Kitchen UNABLE TO FIND DME, Hoyer Lift for age-related physical debility, ICU induced weakness, vascular dementia 1 each 0  . vitamin B-12 (CYANOCOBALAMIN) 250 MCG tablet Take 1 tablet (250 mcg total) by mouth daily for 30 days. 30 tablet 0   No current facility-administered medications for this visit.     Allergies as of 02/17/2019 - Review Complete 02/16/2019  Allergen Reaction Noted  . Crestor [rosuvastatin] Other (See Comments) 05/21/2013  . Lipitor [atorvastatin] Other (See Comments) 05/21/2013  . Shrimp [shellfish allergy] Other (See Comments) 10/10/2018    Family History  Problem Relation Age of Onset  . Heart  failure Mother        Died of "old age" at 50  . Pneumonia Father     Social History   Socioeconomic History  . Marital status: Married    Spouse name: Not on file  . Number of children: 2  . Years of education: Not on file  . Highest education level: Not on file  Occupational History  . Occupation: Owned a Scientist, [employer]  Social Needs  . Financial resource strain: Not on file  . Food insecurity:    Worry: Not on file  Inability: Not on file  . Transportation needs:    Medical: Not on file    Non-medical: Not on file  Tobacco Use  . Smoking status: Never Smoker  . Smokeless tobacco: Never Used  Substance and Sexual Activity  . Alcohol use: Yes    Alcohol/week: 0.0 standard drinks    Comment: 01/11/2014 "drink 1/2 of a beer twice per month   . Drug use: No  . Sexual activity: Not Currently  Lifestyle  . Physical activity:    Days per week: Not on file    Minutes per session: Not on file  . Stress: Not on file  Relationships  . Social connections:    Talks on phone: Not on file    Gets together: Not on file    Attends religious service: Not on file    Active member of club or organization: Not on file    Attends meetings of clubs or organizations: Not on file    Relationship status: Not on file  . Intimate partner violence:    Fear of current or ex partner: Not on file    Emotionally abused: Not on file    Physically abused: Not on file    Forced sexual activity: Not on file  Other Topics Concern  . Not on file  Social History Narrative  . Not on file     Physical Exam: Unable to perform because this was a "telemed visit" due to current Covid-19 pandemic  Assessment and plan: 85 y.o. male with complicated biliary disease, underlying pleasant dementia  He has a percutaneous gallbladder drain in place.  Gallstones within the gallbladder that are probably chronically infected.  He is on IV meropenem at home which is due to run out in 2 days.   Surgeons here in town have very clearly stated that he is not a surgical candidate.  His daughter has a friend who is a Copywriter, advertising in [place] and he spoke with Dr. [name] at [facility].  Dr. [name] mentioned to him that he has performed some type of percutaneous gallstone crushing, morcellation procedure that sounds like it is done through the gallbladder drain tract.  I have never heard of this.  It sounds like an interventional radiologic technique not a gastroenterology technique.  I am not sure if crushing, morcellating the gallstones will improve his situation however she is very interested in working towards that solution however.  I will reach out to 1 of my partners Dr. Rush Landmark who just completed extensive interventional endoscopic training at [facility] and knows Dr. [name] very well.  I would like to get his opinion about this situation.  I am going to leave antibiotic decisions to Dr. Lucianne Lei dam from infectious disease.  Please see the "Patient Instructions" section for addition details about the plan.  Owens Loffler, MD [facility] 02/17/2019, 11:53 AM

## 2019-02-18 ENCOUNTER — Telehealth: Payer: Self-pay

## 2019-02-18 ENCOUNTER — Encounter: Payer: Self-pay | Admitting: Internal Medicine

## 2019-02-18 ENCOUNTER — Ambulatory Visit (INDEPENDENT_AMBULATORY_CARE_PROVIDER_SITE_OTHER): Payer: Medicare Other | Admitting: Internal Medicine

## 2019-02-18 DIAGNOSIS — R5381 Other malaise: Secondary | ICD-10-CM | POA: Insufficient documentation

## 2019-02-18 DIAGNOSIS — I350 Nonrheumatic aortic (valve) stenosis: Secondary | ICD-10-CM

## 2019-02-18 DIAGNOSIS — K8064 Calculus of gallbladder and bile duct with chronic cholecystitis without obstruction: Secondary | ICD-10-CM | POA: Diagnosis not present

## 2019-02-18 DIAGNOSIS — Z8679 Personal history of other diseases of the circulatory system: Secondary | ICD-10-CM | POA: Diagnosis not present

## 2019-02-18 DIAGNOSIS — R54 Age-related physical debility: Secondary | ICD-10-CM | POA: Diagnosis not present

## 2019-02-18 DIAGNOSIS — Z7189 Other specified counseling: Secondary | ICD-10-CM

## 2019-02-18 DIAGNOSIS — R531 Weakness: Secondary | ICD-10-CM

## 2019-02-18 NOTE — Telephone Encounter (Signed)
The pt daughter and wife aware that a call will be made next week to further discuss.  She would also like me to note that at the time of her call next week she would like to discuss the use of Actigall.  I will forward to Dr Ardis Hughs as an Juluis Rainier.

## 2019-02-18 NOTE — Telephone Encounter (Signed)
----- Message from Milus Banister, MD sent at 02/18/2019  9:56 AM EDT ----- Regarding: RE: Follow up William [name], Thanks for thinking about this.  [name], Can you call his daughter. Let him know that Dr. Rush Landmark has reached out to [name], MD at [facility] however Dr. [name] is unavailable (some family issues).   Hopefully they will be able to discuss her father's case next week sometime. We will get back in touch after that conversation.  She should direct any antibiotic questions to Dr. Tommy Medal if she has any.  Thanks  ----- Message ----- From: Irving Copas., MD Sent: 02/17/2019   5:12 PM EDT To: Milus Banister, MD, # Subject: Follow up                                      Linna Hoff, Thanks for reaching out. You got some nice stones out. Maybe his ERCP will be enough for him for now. I reached out to [name] and he is away today and may not be back per his staff until next week due to some family issues. I'm not clear of any recent use of therapeutics to dissolve the stones other than Ursodiol. There were years ago some use of tetra-butyl ether for possible dissolution of gallstones, so I'm not sure if this is what he we talking about, but that fell out of favor probably before most of [place] were in med school (I have to defer to knowing that for sure since you only have a few more years of age ahead of me ... ha). I'll see what he says. We can consider role of cholecystoenterostomy or cholecystogastrostomy creation with AXIOS for a period in time to try and form a tract between the region. There are case series being done now of patients leaving the AXIOS stents for 2-3 months and then being removed. There are also case reports of people doing an AXIOS to the gallbladder, creating the fistula, then doing stone extraction.  This is a much higher risk procedure. Is the patient truly a bad candidate for surgery, or would one of the other larger centers think he could be a candidate? I'll update you  when I hear back from [name].William [name] ----- Message ----- From: Milus Banister, MD Sent: 02/17/2019   2:34 PM EDT To: Irving Copas., MD  William [name],  Can you take a look at this man's chart.  He is 11, pleasantly demented, very frail.  He has a gallbladder drain in place with gallstones that are probably chronically infected.  He is at home on IV meropenem, I cleared gallstones out of his bile duct about a month ago while he was hospitalized with sepsis, cholangitis.  Surgeons here in town has absolutely said he is not a surgical candidate.  His daughter has a gastroenterologist friend in [place] who has spoken with [name].  [name] apparently told this friend that he would consider what sounds like percutaneous approach to crush the gallstones or morcellate them, dissolve them.  He thinks this may help his overall situation however I have not heard of this technique and I am not sure that it will make any difference overall anyway.  Infectious disease Dr. Tommy Medal offered chronic suppressing antibiotics one the meropenum runs out in two days.  The drain to remain in place indefinitely.  She would like to pursue the stan branch option.  Have  you ever heard of such a technique?  If not have you heard of anything that might help this fellow via interventional methods?  Thanks for your time

## 2019-02-18 NOTE — ACP (Advance Care Planning) (Signed)
At the hospital, resuscitation was discussed with William [name] and she agreed that if his heart stopped, she did not want him to receive CPR.  She actually would like him to potentially go on a ventilator.  William [name] says "they were after them about that the whole time at the hospital."  They would like him to be able to be hospitalized or put on a vent if needed.  They do want him to receive antibiotics if needed.  They are basing the decision of the ventilator option due to him surviving after it was used in February.   They want to have documentation on file.    MOST done today and in vynca as follows:   DNR Full scope including ventilation and defibrillation if needed Abx and fluids if needed No tube feeding  Discussed with wife, William [name] and daughter, William [name], both of whom were on the phone call.  Patient also present.  They also report that their other daughter is also aware and in agreement with plans.

## 2019-02-18 NOTE — Progress Notes (Signed)
Patient ID: William [name], male   DOB: [birth year], 85 y.o.   MRN: [id number] This service is provided via telemedicine  No vital signs collected/recorded due to the encounter was a telemedicine visit.   Location of patient (ex: home, work):  HOME  Patient consents to a telephone visit:  YES  Location of the provider (ex: office, home):  HOME  Name of any referring provider:  DR Hollace Kinnier, DO  Names of all persons participating in the telemedicine service and their role in the encounter:  ALLISON DAUGHTER, PATIENT, Edwin Dada, CMA, ANN, WIFE, DR Saman Umstead DO  Time spent on call:  13:03    Provider:  Celena Lanius L. Mariea Clonts, D.O., C.M.D.  Code Status: DNR, but MOST form completed today via Vynca  Goals of Care:  Advanced Directives 01/24/2019  Does Patient Have a Medical Advance Directive? Unable to assess, patient is non-responsive or altered mental status  Type of Advance Directive -  Does patient want to make changes to medical advance directive? -  Copy of [place] in Chart? -  Would patient like information on creating a medical advance directive? -  Pre-existing out of facility DNR order (yellow form or pink MOST form) -   Chief Complaint  Patient presents with  . Medical Management of Chronic Issues    hospital follow-up visit, discharged 02/08/2019    HPI: Patient is a 85 y.o. male with h/o recurrent sepsis related to cholelithiasis and choledocholithiasis.  He remains on meropenem IV 1g q 8h which he completes tomorrow after his last episode of biliary sepsis from 4/12-4/27/20 at [facility].    William [name] denies pain today.  His wife is here holding up ok--William [name].  I'm also speaking with William [name], their daughter.  He has a small wheelchair that's not the greatest.  He had a sophisticated one at [place] that was wide enough for a 200 lb man.  GI is going to consult with IR about doing a special procedure that involves grinding up the stones.  It would involve  light sedation and lidocaine.  There is some question whether it would help with releasing the infection if the stones were ground up.  William [name] also says another ERCP could also be done if he got another stone in the duct.    He's been afebrile.  He is doing fine right now.  His IV abx finishes tomorrow, 5/8.  Dr. Tommy Medal offered ongoing oral abx (though he did share not ideal) to help keep [name] out of the hospital.  Surgery and cardiology have felt that surgery is too risky and I agree.      He currently is getting nursing care with [facility] for home health support.  They have a nurse coming bid.  They've got the contact information for the IV team which can insert a replacement or new picc or midline in his home if indicated.    They also discussed with Dr. Ardis Hughs about actigall possibly being helpful to dad to help make the bile more soluble.  They're going to run it by the GI consultant at [facility] next week.  At the hospital, resuscitation was discussed with [name] and she agreed that if his heart stopped, she did not want him to receive CPR.  She actually would like him to potentially go on a ventilator.  William [name] says "they were after them about that the whole time at the hospital."  They would like him to be able to be hospitalized or put  on a vent if needed.  They do want him to receive antibiotics if needed.  They are basing the decision of the ventilator option due to him surviving after it was used in February.   They want to have documentation on file.    Past Medical History:  Diagnosis Date  . AAA (abdominal aortic aneurysm) ([place])    7/13 3.8cm  . Arthritis    "joints" (01/11/2014)  . Asthma    "seasonal; some foods"   . Chronic systolic CHF (congestive heart failure) ([place])   . Dementia ([place])   . HLD (hyperlipidemia)   . HOH (hard of hearing)   . Lumbar vertebral fracture ([facility])    L1- 04/11/2014   . Prostate cancer ([place])   . S/P ICD (internal cardiac defibrillator) procedure,  01/11/14 removal of ERI gen and placement of Medtronic Evera XT VR & NEW Right ventricular lead Medtronic 01/12/2014  . S/P TAVR (transcatheter aortic valve replacement)    Edwards Sapien 3 THV (size 29 mm, model # B6411258, serial # [id number])  . Severe aortic stenosis   . Sleep apnea    "lost 60# & don't have it anymore" (01/11/2014)  . Ventricular tachycardia [facility])     Past Surgical History:  Procedure Laterality Date  . CARDIAC CATHETERIZATION  08/20/2004   noncritical CAD,mild global hypokinesis, EF 50%  . CARDIAC DEFIBRILLATOR PLACEMENT  08/23/2004   Medtronic  . CATARACT EXTRACTION W/ INTRAOCULAR LENS IMPLANT Right   . COLECTOMY  1990's  . CRYOABLATION N/A 02/24/2014   Procedure: CRYO ABLATION PROSTATE;  Surgeon: Ailene Rud, MD;  Location: [facility] ORS;  Service: Urology;  Laterality: N/A;  . ERCP N/A 01/29/2019   Procedure: ENDOSCOPIC RETROGRADE CHOLANGIOPANCREATOGRAPHY (ERCP);  Surgeon: Milus Banister, MD;  Location: [facility] ENDOSCOPY;  Service: Endoscopy;  Laterality: N/A;  . HERNIA REPAIR     "abdomen; from colon OR"  . ICD LEAD REMOVAL N/A 09/15/2018   Procedure: ICD LEAD REMOVAL EXTRACTION;  Surgeon: Evans Lance, MD;  Location: [facility] OR;  Service: Cardiovascular;  Laterality: N/A;  DR. BARTLE TO BACK UP  . IMPLANTABLE CARDIOVERTER DEFIBRILLATOR (ICD) GENERATOR CHANGE N/A 01/11/2014   Procedure: ICD GENERATOR CHANGE;  Surgeon: Sanda Klein, MD;  Location: [place] CATH LAB;  Service: Cardiovascular;  Laterality: N/A;  . IR EXCHANGE BILIARY DRAIN  12/18/2018  . IR EXCHANGE BILIARY DRAIN  01/05/2019  . IR EXCHANGE BILIARY DRAIN  01/25/2019  . IR PERC CHOLECYSTOSTOMY  11/22/2018  . IR SINUS/FIST TUBE CHK-NON GI  01/28/2019  . LEAD REVISION N/A 01/11/2014   Procedure: LEAD REVISION;  Surgeon: Sanda Klein, MD;  Location: [place] CATH LAB;  Service: Cardiovascular;  Laterality: N/A;  . NM MYOCAR PERF WALL MOTION  01/29/2012   abnormal c/o infarct/scar,no ischemia present  . PROSTATE BIOPSY N/A  11/22/2013   Procedure: PROSTATE BIOPSY AND ULTRASOUND;  Surgeon: Ailene Rud, MD;  Location: [facility] ORS;  Service: Urology;  Laterality: N/A;  . REMOVAL OF STONES  01/29/2019   Procedure: REMOVAL OF STONES;  Surgeon: Milus Banister, MD;  Location: [facility] ENDOSCOPY;  Service: Endoscopy;;  . RIGHT/LEFT HEART CATH AND CORONARY ANGIOGRAPHY N/A 07/06/2018   Procedure: RIGHT/LEFT HEART CATH AND CORONARY ANGIOGRAPHY;  Surgeon: Troy Sine, MD;  Location: [place] CV LAB;  Service: Cardiovascular;  Laterality: N/A;  . SPHINCTEROTOMY  01/29/2019   Procedure: SPHINCTEROTOMY;  Surgeon: Milus Banister, MD;  Location: [facility] ENDOSCOPY;  Service: Endoscopy;;  . TEE WITHOUT CARDIOVERSION N/A 08/18/2018   Procedure: TRANSESOPHAGEAL  ECHOCARDIOGRAM (TEE);  Surgeon: Burnell Blanks, MD;  Location: [place];  Service: Open Heart Surgery;  Laterality: N/A;  . TEE WITHOUT CARDIOVERSION N/A 09/08/2018   Procedure: TRANSESOPHAGEAL ECHOCARDIOGRAM (TEE);  Surgeon: Lelon Perla, MD;  Location: [facility] ENDOSCOPY;  Service: Cardiovascular;  Laterality: N/A;  . TEE WITHOUT CARDIOVERSION N/A 09/15/2018   Procedure: TRANSESOPHAGEAL ECHOCARDIOGRAM (TEE);  Surgeon: Evans Lance, MD;  Location: [facility] OR;  Service: Cardiovascular;  Laterality: N/A;  . TEE WITHOUT CARDIOVERSION N/A 11/26/2018   Procedure: TRANSESOPHAGEAL ECHOCARDIOGRAM (TEE);  Surgeon: Jerline Pain, MD;  Location: [facility] ENDOSCOPY;  Service: Cardiovascular;  Laterality: N/A;  . TRANSCATHETER AORTIC VALVE REPLACEMENT, TRANSFEMORAL  08/18/2018  . TRANSCATHETER AORTIC VALVE REPLACEMENT, TRANSFEMORAL N/A 08/18/2018   Procedure: TRANSCATHETER AORTIC VALVE REPLACEMENT, TRANSFEMORAL using an Edwards 77m Aortic Valve;  Surgeon: MBurnell Blanks MD;  [place]  Service: Open Heart Surgery;  Laterality: N/A;    Allergies  Allergen Reactions  . Crestor [Rosuvastatin] Other (See Comments)    Hurts muscles  . Lipitor [Atorvastatin] Other (See Comments)     Hurts stomach  . Shrimp [Shellfish Allergy] Other (See Comments)    On MAR    Outpatient Encounter Medications as of 02/18/2019  Medication Sig  . acetaminophen (TYLENOL) 500 MG tablet Take 500 mg by mouth every 6 (six) hours as needed (for pain).   .Marland Kitchenamiodarone (PACERONE) 200 MG tablet Take 0.5 tablets (100 mg total) by mouth daily.  .Marland Kitchenaspirin 81 MG chewable tablet Chew 1 tablet (81 mg total) by mouth daily.  . carvedilol (COREG) 3.125 MG tablet Take 1 tablet (3.125 mg total) by mouth 2 (two) times daily with a meal for 30 days.  . cholecalciferol (VITAMIN D) 1000 units tablet Take 2,000 Units by mouth at bedtime.  . meropenem (MERREM) IVPB Inject 1 g into the vein every 8 (eight) hours.  . midodrine (PROAMATINE) 5 MG tablet Take 1 tablet (5 mg total) by mouth 3 (three) times daily with meals for 30 days.  .Marland Kitchennystatin-triamcinolone (MYCOLOG II) cream Apply 1 application topically See admin instructions. Apply to right and left buttocks twice daily for 7 days - stop date 01/25/19  . pantoprazole (PROTONIX) 40 MG tablet Take 1 tablet (40 mg total) by mouth at bedtime for 30 days.  . polyethylene glycol (MIRALAX / GLYCOLAX) packet Take 17 g by mouth every evening.  . sodium chloride flush (NS) 0.9 % SOLN flush drain once daily with 5-10 cc sterile saline Record output daily  . triamcinolone (KENALOG) 0.025 % cream Apply 1 application topically 2 (two) times daily.   . vitamin B-12 (CYANOCOBALAMIN) 250 MCG tablet Take 1 tablet (250 mcg total) by mouth daily for 30 days.  . [DISCONTINUED] amoxicillin-clavulanate (AUGMENTIN) 875-125 MG tablet Take 1 tablet by mouth 2 (two) times daily.  . [DISCONTINUED] bisacodyl (DULCOLAX) 10 MG suppository Place 1 suppository (10 mg total) rectally daily as needed for moderate constipation.  . [DISCONTINUED] meropenem (MERREM) IVPB Inject 1 g into the vein every 8 (eight) hours for 13 days. Indication:  Cholecystitis/intra-abdominal infection Last Day of Therapy:   02/21/2019 Labs - Once weekly:  CBC/D and BMP, Labs - Every other week:  ESR and CRP  . [DISCONTINUED] promethazine (PHENERGAN) 25 MG/ML injection Inject 12.5 mg into the muscle every 6 (six) hours as needed for nausea or vomiting.  . [DISCONTINUED] sulfamethoxazole-trimethoprim (BACTRIM DS) 800-160 MG tablet Take 1 tablet by mouth 2 (two) times daily. (Patient not taking: Reported on 02/16/2019)  . [  DISCONTINUED] UNABLE TO FIND DME, Hoyer Lift for age-related physical debility, ICU induced weakness, vascular dementia   No facility-administered encounter medications on file as of 02/18/2019.     Review of Systems:  Review of Systems  Constitutional: Positive for malaise/fatigue. Negative for chills and fever.  HENT: Positive for hearing loss.   Eyes: Negative for blurred vision.  Respiratory: Negative for shortness of breath.   Cardiovascular: Negative for chest pain, palpitations and leg swelling.  Gastrointestinal: Negative for abdominal pain, diarrhea, nausea and vomiting.       Biliary drain in pace and functional  Genitourinary: Negative for dysuria.  Musculoskeletal: Negative for falls and joint pain.  Skin: Negative for rash.  Neurological: Negative for dizziness and loss of consciousness.  Endo/Heme/Allergies: Bruises/bleeds easily.  Psychiatric/Behavioral: Positive for memory loss. Negative for depression. The patient is not nervous/anxious and does not have insomnia.     Health Maintenance  Topic Date Due  . TETANUS/TDAP  11/02/1946  . INFLUENZA VACCINE  05/15/2019  . PNA vac Low Risk Adult  Completed    Physical Exam: Could not be performed as visit non face-to-face via phone   Labs reviewed: Basic Metabolic Panel: Recent Labs    11/25/18 0357 11/26/18 0430 11/27/18 0404  01/25/19 0305  02/03/19 0349  02/06/19 0242 02/07/19 0255 02/08/19 0327  NA 139 138  --    < > 138   < > 131*   < > 136 135 135  K 3.7 3.1*  --    < > 3.7   < > 3.9   < > 3.6 4.0 3.3*  CL  108 109  --    < > 103   < > 102   < > 106 104 101  CO2 25 24  --    < > 23   < > 20*   < > 22 21* 24  GLUCOSE 135* 133*  --    < > 151*   < > 176*   < > 125* 119* 107*  BUN 14 12  --    < > 15   < > 12   < > _0 CREATININE 0.88 0.81  --    < > 1.19   < > 1.03   < > 0.88 0.95 0.90  CALCIUM 7.6* 7.6*  --    < > 8.5*   < > 8.7*   < > 8.2* 8.4* 8.5*  MG 2.0 2.0 2.0  --   --    < >  --    < > 2.0 2.0 2.1  PHOS 2.2* 2.7 3.3  --   --   --   --   --   --   --   --   TSH  --   --   --   --  1.043  --  1.596  --   --   --   --    < > = values in this interval not displayed.   Liver Function Tests: Recent Labs    02/06/19 0242 02/07/19 0255 02/08/19 0327  AST 30 33 26  ALT _1 ALKPHOS 120 121 114  BILITOT 0.9 0.9 1.2  PROT 5.8* 6.0* 5.9*  ALBUMIN 2.1* 2.2* 2.2*   Recent Labs    08/07/18 1411 11/21/18 0851 11/22/18 1229 12/12/18 1535 01/24/19 1935  LIPASE _2 51  AMYLASE 19* 10* 8*  --   --    Recent  Labs    12/12/18 1528 01/24/19 2317 02/03/19 0349  AMMONIA _0 CBC: Recent Labs    12/12/18 1535  01/24/19 1935 01/25/19 0305  02/06/19 0242 02/07/19 0255 02/08/19 0327  WBC 8.3   < > 11.1* 10.6*   < > 5.8 5.6 5.7  NEUTROABS 5.4  --  10.0* 9.0*  --   --   --   --   HGB 12.5*   < > 12.8* 12.0*   < > 11.3* 12.5* 11.9*  HCT 37.7*   < > 36.6* 35.9*   < > 32.4* 37.2* 33.8*  MCV 91.5  --  89.9 89.8   < > 89.8 93.5 88.3  PLT 166   < > 146* 132*   < > 138* 153 130*   < > = values in this interval not displayed.   Lipid Panel: Recent Labs    01/26/19 0257 01/27/19 0425  CHOL 139 140  HDL 22* 22*  LDLCALC 101* 100*  TRIG 81 91  CHOLHDL 6.3 6.4   Lab Results  Component Value Date   HGBA1C 5.7 (H) 01/27/2019    Procedures since last visit: Dg Forearm Left  Result Date: 02/03/2019 CLINICAL DATA:  Left arm pain. EXAM: LEFT FOREARM - 2 VIEW COMPARISON:  None. FINDINGS: There is no evidence of fracture or other focal bone lesions. Vascular  calcifications are noted. IMPRESSION: No significant abnormality seen in the left forearm. Electronically Signed   By: Marijo Conception M.D.   On: 02/03/2019 15:46   Dg Wrist 2 Views Left  Result Date: 02/05/2019 CLINICAL DATA:  Left wrist pain. EXAM: LEFT WRIST - 2 VIEW COMPARISON:  Radiographs of February 03, 2019. FINDINGS: There is no evidence of fracture or dislocation. Vascular calcifications are noted. Mild degenerative changes seen involving the first carpometacarpal joint as well as the scaphoid trapezoid space. IMPRESSION: Mild osteoarthritis as described above. No acute abnormality seen in the left wrist. Electronically Signed   By: Marijo Conception M.D.   On: 02/05/2019 17:01   Ct Head Wo Contrast  Result Date: 02/02/2019 CLINICAL DATA:  85 year old who underwent sphincterotomy and bile duct stone removal on 01/29/2019, current history of sepsis secondary to cholecystitis and cholangitis with an indwelling cholecystostomy tube, now with acute mental status changes, possibly related to metabolic encephalopathy. EXAM: CT HEAD WITHOUT CONTRAST TECHNIQUE: Contiguous axial images were obtained from the base of the skull through the vertex without intravenous contrast. COMPARISON:  12/12/2018 and earlier. FINDINGS: Brain: Patient's head is turned to the LEFT. Severe cortical, deep and cerebellar atrophy, unchanged since December, 2019, but progressive since 2014. Moderate to severe changes of small vessel as severe changes of small vessel disease of the white matter diffusely, unchanged. Physiologic calcifications in the basal ganglia. No mass lesion. No midline shift. No acute hemorrhage or hematoma. No extra-axial fluid collections. No evidence of acute infarction. Vascular: Severe BILATERAL carotid siphon and vertebral artery atherosclerosis. Severe atherosclerosis involving MIDDLE cerebral arteries. 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  No hyperdense vessel. Skull: No skull fracture or other focal osseous abnormality involving the skull. Sinuses/Orbits: Visualized paranasal sinuses, bilateral mastoid air cells and bilateral middle ear cavities well-aerated. Benign calcified senile plaques in both eyes. Other: None. IMPRESSION: 1. No acute intracranial abnormality. 2. Severe generalized atrophy and moderate to severe chronic microvascular ischemic changes of the white matter. 3. 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 Electronically Signed   By: Evangeline Dakin M.D.   On: 02/02/2019 14:19   Mr Brain Wo Contrast  Result Date: 02/02/2019 CLINICAL DATA:  Altered mental status EXAM: MRI HEAD WITHOUT CONTRAST TECHNIQUE: Multiplanar, multiecho pulse sequences of the brain and surrounding structures were obtained without intravenous contrast. COMPARISON:  CT head 02/02/2019 FINDINGS: Brain: Image quality degraded by significant motion. Moderate to advanced atrophy. Ventricular enlargement consistent with atrophy. Negative for acute infarct. Moderate chronic microvascular ischemic changes in the white matter. Small chronic infarcts in the right cerebellum. Negative for hemorrhage or mass. No midline shift or fluid collection Vascular: Normal arterial flow voids. Atherosclerotic ectasia of the basilar artery as noted previously. Skull and upper cervical spine: Negative Sinuses/Orbits: Paranasal sinuses clear.  Bilateral cataract surgery Other: None IMPRESSION: Atrophy and chronic ischemic change. No acute intracranial abnormality Image quality degraded by motion. Electronically Signed   By: Franchot Gallo M.D.   On: 02/02/2019 15:33   Ir Sinus/fist Tube Chk-non Gi  Result Date: 01/28/2019 INDICATION: 85 year old with cholecystostomy tube that was recently replaced. The retention suture has been pulled out of the skin. EXAM: DRAIN INJECTION WITH FLUOROSCOPY COMPARISON:  None.  MEDICATIONS: None ANESTHESIA/SEDATION: None COMPLICATIONS: None immediate. TECHNIQUE: Drain was injected with contrast under fluoroscopy. 10 mL Omnipaque 300 was injected. PROCEDURE: The drain and surrounding skin was prepped with chlorhexidine. A new 0-Prolene suture was used to secure the catheter to the skin. FINDINGS: Cholecystostomy tube remains within the gallbladder. Again noted are multiple filling defects compatible with gallstones. IMPRESSION: Cholecystostomy tube remains in the gallbladder. Drain was secured to skin with new suture. Electronically Signed   By: Markus Daft M.D.   On: 01/28/2019 17:47   Mr 3d Recon At Scanner  Result Date: 01/28/2019 CLINICAL DATA:  85 year old male with history of acute cholecystitis in February 4332, complicated by sepsis and respiratory failure. Status post cholecystostomy drainage. Readmission for lethargy, fever, nausea and leukocytosis with newly increased liver function tests. EXAM: MRI ABDOMEN WITHOUT AND WITH CONTRAST (INCLUDING MRCP) TECHNIQUE: Multiplanar multisequence MR imaging of the abdomen was performed both before and after the administration of intravenous contrast. Heavily T2-weighted images of the biliary and pancreatic ducts were obtained, and three-dimensional MRCP images were rendered by post processing. CONTRAST:  9 mL of Gadavist. COMPARISON:  No prior abdominal MRI. CT the abdomen and pelvis 11/21/2018. FINDINGS: Comment: Today's study is limited by considerable patient motion. Lower chest: Small bilateral pleural effusions lying dependently. Signal void in the region of the aortic root related to prior TAVR procedure. Hepatobiliary: No discrete cystic or solid hepatic lesions are confidently identified on today's motion limited examination. MRCP images are largely nondiagnostic. However, other T2 weighted sequences demonstrate no definite intrahepatic biliary ductal dilatation. A percutaneous cholecystostomy tube is noted. Multiple filling  defects within the gallbladder are compatible with multiple gallstones. Gallbladder does not appear overly distended, and there is no large volume of pericholecystic fluid noted at this time. A trace amount of irregular T2 signal intensity is noted adjacent to the gallbladder, likely within normal limits given the indwelling percutaneous cholecystostomy tube. Innumerable filling defects are also noted within the common bile duct, compatible with choledocholithiasis. Common bile duct appears to measure up to 13 mm distally. Pancreas: Pancreatic atrophy. Multiple well-defined T1 hypointense, T2 hyperintense, nonenhancing lesions are noted throughout the body of the pancreas, measuring up to 1.4 cm (axial image 25 of series 4). No pancreatic ductal dilatation. No peripancreatic fluid collections  or inflammatory changes. Spleen:  Unremarkable. Adrenals/Urinary Tract: There are multiple T1 hypointense, T2 hyperintense, nonenhancing lesions in the kidneys bilaterally, compatible with simple cysts, largest of which is exophytic in the posterior aspect of the upper pole of the right kidney measuring 5 cm. No hydroureteronephrosis in the visualized portions of the abdomen. Bilateral adrenal glands are normal in appearance. Stomach/Bowel: Visualized portions are unremarkable. Vascular/Lymphatic: Aortic atherosclerosis with fusiform aneurysmal dilatation of the infrarenal abdominal aorta which measures up to 4.2 x 5.2 cm. No lymphadenopathy identified in the abdomen. Other: No significant volume of ascites noted in the visualized portions of the peritoneal cavity. Musculoskeletal: No aggressive appearing lytic or blastic lesions noted in the visualized portions of the skeleton. IMPRESSION: 1. Choledocholithiasis with dilated common bile duct measuring up to 13 mm in the porta hepatis. Despite the presence of numerous stones in this mildly dilated duct, there is no intrahepatic biliary ductal dilatation noted at this time. 2.  Cholelithiasis with percutaneous cholecystostomy tube in place. Minimal surrounding T2 hyperintensity adjacent to the gallbladder which is likely within normal limits for a patient with an indwelling percutaneous cholecystostomy tube. 3. Aortic atherosclerosis with fusiform dilatation of the infrarenal abdominal aorta. 4. Small bilateral pleural effusions lying dependently. Electronically Signed   By: Vinnie Langton M.D.   On: 01/28/2019 08:04   Dg Chest Port 1 View  Result Date: 01/24/2019 CLINICAL DATA:  Tachypnea and possible sepsis EXAM: PORTABLE CHEST 1 VIEW COMPARISON:  12/12/2018 FINDINGS: Cardiac shadow is enlarged. Changes of prior TAVR are again seen. Aortic calcifications are noted. The lungs are well aerated bilaterally. Mild diffuse interstitial changes are noted similar to that seen on the prior exam. Patchy atelectasis/early infiltrate is noted in the left base. No bony abnormality is seen. IMPRESSION: Patchy left basilar atelectasis/early infiltrate. Electronically Signed   By: Inez Catalina M.D.   On: 01/24/2019 20:10   Dg Shoulder Left  Result Date: 02/03/2019 CLINICAL DATA:  Left shoulder pain. EXAM: LEFT SHOULDER - 2+ VIEW COMPARISON:  Radiographs of October 10, 2018. FINDINGS: There is no evidence of fracture or dislocation. Moderate degenerative changes seen involving the left glenohumeral joint. Soft tissues are unremarkable. 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 No acute abnormality seen in the left shoulder. Electronically Signed   By: Marijo Conception M.D.   On: 02/03/2019 15:50   Dg Ercp Biliary & Pancreatic Ducts  Result Date: 01/29/2019 CLINICAL DATA:  85 year old male with choledocholithiasis undergoing ERCP. EXAM: ERCP TECHNIQUE: Multiple spot images obtained with the fluoroscopic device and submitted for interpretation post-procedure. FLUOROSCOPY TIME:  Fluoroscopy Time:  3 minutes 14 seconds. COMPARISON:  MRCP 01/27/2019. FINDINGS: A  total of 5 intraoperative spot views are submitted for review. The images demonstrate a flexible endoscope in the descending duodenum followed by wire cannulation of the common bile duct and cholangiogram. Multiple filling defects are present throughout the common bile duct consistent with choledocholithiasis. Subsequent images demonstrate sphincterotomy and balloon sweeping of the common duct. A pigtail drainage catheter is also evident in the right upper quadrant consistent with the recently placed percutaneous cholecystostomy tube. IMPRESSION: ERCP with sphincterotomy and balloon sweep of the common duct. These images were submitted for radiologic interpretation only. Please see the procedural report for the amount of contrast and the fluoroscopy time utilized. Electronically Signed   By: Jacqulynn Cadet M.D.   On: 01/29/2019 11:27   Mr Abdomen Mrcp Moise Boring Contast  Result Date: 01/28/2019 CLINICAL DATA:  85 year old male with history  of acute cholecystitis in February 4920, complicated by sepsis and respiratory failure. Status post cholecystostomy drainage. Readmission for lethargy, fever, nausea and leukocytosis with newly increased liver function tests. EXAM: MRI ABDOMEN WITHOUT AND WITH CONTRAST (INCLUDING MRCP) TECHNIQUE: Multiplanar multisequence MR imaging of the abdomen was performed both before and after the administration of intravenous contrast. Heavily T2-weighted images of the biliary and pancreatic ducts were obtained, and three-dimensional MRCP images were rendered by post processing. CONTRAST:  9 mL of Gadavist. COMPARISON:  No prior abdominal MRI. CT the abdomen and pelvis 11/21/2018. FINDINGS: Comment: Today's study is limited by considerable patient motion. Lower chest: Small bilateral pleural effusions lying dependently. Signal void in the region of the aortic root related to prior TAVR procedure. Hepatobiliary: No discrete cystic or solid hepatic lesions are confidently identified on  today's motion limited examination. MRCP images are largely nondiagnostic. However, other T2 weighted sequences demonstrate no definite intrahepatic biliary ductal dilatation. A percutaneous cholecystostomy tube is noted. Multiple filling defects within the gallbladder are compatible with multiple gallstones. Gallbladder does not appear overly distended, and there is no large volume of pericholecystic fluid noted at this time. A trace amount of irregular T2 signal intensity is noted adjacent to the gallbladder, likely within normal limits given the indwelling percutaneous cholecystostomy tube. Innumerable filling defects are also noted within the common bile duct, compatible with choledocholithiasis. Common bile duct appears to measure up to 13 mm distally. Pancreas: Pancreatic atrophy. Multiple well-defined T1 hypointense, T2 hyperintense, nonenhancing lesions are noted throughout the body of the pancreas, measuring up to 1.4 cm (axial image 25 of series 4). No pancreatic ductal dilatation. No peripancreatic fluid collections or inflammatory changes. Spleen:  Unremarkable. Adrenals/Urinary Tract: There are multiple T1 hypointense, T2 hyperintense, nonenhancing lesions in the kidneys bilaterally, compatible with simple cysts, largest of which is exophytic in the posterior aspect of the upper pole of the right kidney measuring 5 cm. No hydroureteronephrosis in the visualized portions of the abdomen. Bilateral adrenal glands are normal in appearance. Stomach/Bowel: Visualized portions are unremarkable. Vascular/Lymphatic: Aortic atherosclerosis with fusiform aneurysmal dilatation of the infrarenal abdominal aorta which measures up to 4.2 x 5.2 cm. No lymphadenopathy identified in the abdomen. Other: No significant volume of ascites noted in the visualized portions of the peritoneal cavity. Musculoskeletal: No aggressive appearing lytic or blastic lesions noted in the visualized portions of the skeleton. IMPRESSION: 1.  Choledocholithiasis with dilated common bile duct measuring up to 13 mm in the porta hepatis. Despite the presence of numerous stones in this mildly dilated duct, there is no intrahepatic biliary ductal dilatation noted at this time. 2. Cholelithiasis with percutaneous cholecystostomy tube in place. Minimal surrounding T2 hyperintensity adjacent to the gallbladder which is likely within normal limits for a patient with an indwelling percutaneous cholecystostomy tube. 3. Aortic atherosclerosis with fusiform dilatation of the infrarenal abdominal aorta. 4. Small bilateral pleural effusions lying dependently. Electronically Signed   By: Vinnie Langton M.D.   On: 01/28/2019 08:04   Ir Exchange Biliary Drain  Result Date: 01/25/2019 INDICATION: 85 year old with biliary sepsis and history of a cholecystostomy tube. EXAM: CHOLECYSTOSTOMY TUBE EXCHANGE WITH FLUOROSCOPY CHOLANGIOGRAM THROUGH EXISTING CATHETER MEDICATIONS: None ANESTHESIA/SEDATION: None FLUOROSCOPY TIME:  Fluoroscopy Time: 1 minutes, 12 seconds, 9 mGy COMPLICATIONS: None immediate. PROCEDURE: Informed written consent was obtained from the patient after a thorough discussion of the procedural risks, benefits and alternatives. All questions were addressed. Maximal Sterile Barrier Technique was utilized including caps, mask, sterile gowns, sterile gloves, sterile drape, hand hygiene  and skin antiseptic. A timeout was performed prior to the initiation of the procedure. The existing gallbladder drainage catheter was prepped and draped in a sterile fashion. The retention suture was still intact. It was difficult to flush the catheter and the tube appeared to be partially obstructed. Purulent looking fluid was draining around the catheter during injection of the tube. The tube was found to be within the gallbladder. The retention suture was removed. The catheter was cut. The catheter was removed over a stiff Amplatz wire. A new 12 [place] multipurpose drain was  easily advanced over the wire and reconstituted in the gallbladder. Approximately 40 mL of light brown purulent looking fluid was removed from the gallbladder. Small amount of contrast was injected through the gallbladder drain. Drain was flushed with normal saline. Skin was anesthetized with 1% lidocaine and sutured to skin with Prolene suture. FINDINGS: Old catheter was well positioned in the gallbladder but the tube appeared to be partially obstructed. Greater than 40 mL of brown purulent looking fluid was removed from the gallbladder after the drain exchange. Fluid was sent for culture. Cholangiogram demonstrated partial filling of the cystic duct but no filling of the common bile duct. Innumerable gallstones. IMPRESSION: 1. Successful exchange of the cholecystostomy tube. 2. Innumerable gallstones. Unable to opacify the common bile duct due to obstruction of the cystic duct. 3. Purulent looking fluid removed from the gallbladder. Fluid sent for culture. Electronically Signed   By: Markus Daft M.D.   On: 01/25/2019 17:11   US Abdomen Limited Ruq  Result Date: 01/24/2019 CLINICAL DATA:  85 y/o  M; transaminitis. EXAM: ULTRASOUND ABDOMEN LIMITED RIGHT UPPER QUADRANT COMPARISON:  11/21/2018 CT abdomen and pelvis and abdominal ultrasound. 11/22/2018, 12/18/2018, 01/05/2019 interventional radiology cholecystostomy and cholecystostomy exchanges. FINDINGS: Gallbladder: Cholelithiasis with the largest stone measuring 9 mm near the gallbladder neck. Gallbladder wall thickening to 4.9 mm. Negative sonographic Murphy's sign. No pericholecystic fluid. Linear echogenic structure likely representing the cholecystostomy tube is partially visualized within gallbladder fossa, limited evaluation due to overlying bandaging. Common bile duct: Diameter: 7.3 mm Liver: No focal lesion identified. Within normal limits in parenchymal echogenicity. Portal vein is patent on color Doppler imaging with normal direction of blood flow  towards the liver. IMPRESSION: 1. Exam limited due to overlying bandaging in the right upper quadrant. 2. Mild gallbladder wall thickening, probably chronic sequelae of cholecystitis and cholecystostomy tube. Negative sonographic Murphy's sign. Cholelithiasis again noted. 3. No significant intra or extrahepatic biliary ductal dilatation identified. Electronically Signed   By: Kristine Garbe M.D.   On: 01/24/2019 22:25    Assessment/Plan 1. Weakness acquired in ICU - continues with home health PT, OT at home - DME Other see comment-electric hoyer lift - DME Wheelchair manual-bariatric with leg rests, cushion, anti-tippers  2. Age-related physical debility - has had several hospitalizations and function declined as a result - DME Other see comment - DME Wheelchair manual  3. Frail elderly - as above, cont therapy - DME Other see comment - DME Wheelchair manual  4. Chronic cholecystitis due to cholelithiasis with choledocholithiasis -ongoing -family requests aggressive tx -they are now pursuing a less invasive IR procedure at [facility] to grind up gallstones in hopes of preventing recurrent biliary sepsis -he is still completing his latest meropenem IV thru a midline in his home with [place]  5. Severe aortic stenosis -contributes to his high risk surgical status  6. History of ventricular tachycardia -with defibrillator removed, see MOST form--pt's wife does want defibrillation and  ventilation if indicated but no CPR  7. ACP (advance care planning) - Last ACP Note 11/20/2018 to 02/18/2019       ACP (Advance Care Planning) by Gayland Curry, DO at 02/18/2019  8:30 AM    Date of Service   Author Author Type Status Note Type File Time  02/18/2019 Addend Gayland Curry, DO Physician Signed ACP (Advance Care Planning) 02/18/2019             At the hospital, resuscitation was discussed with Mrs. Emmitt and she agreed that if his heart stopped, she did not want him to receive  CPR.  She actually would like him to potentially go on a ventilator.  William [name] says "they were after them about that the whole time at the hospital."  They would like him to be able to be hospitalized or put on a vent if needed.  They do want him to receive antibiotics if needed.  They are basing the decision of the ventilator option due to him surviving after it was used in February.   They want to have documentation on file.    MOST done today and in vynca as follows:   DNR Full scope including ventilation and defibrillation if needed Abx and fluids if needed No tube feeding  Discussed with wife, Webb Silversmith and daughter, William [name], both of whom were on the phone call.  Patient also present.  They also report that their other daughter is also aware and in agreement with plans.              More notes were found than are currently displayed.     Labs/tests ordered:  No new by me Next appt:  prn Non face-to-face time spent on televisit:  25 mins on med mgt 45 mins on ACP   L. , D.O. [facility] [address], [place] [address] Cell Phone (Mon-Fri 8am-5pm):  [phone] On Call:  [phone] & follow prompts after 5pm & weekends Office Phone:  [phone] Office Fax:  [phone]

## 2019-02-18 NOTE — Patient Instructions (Signed)
Please send me an update via mychart or call our office to update me after you communicate with the [facility] IR physician.  I will keep Dr. Tommy Medal and Dr. Ardis Hughs in the loop on the [place] end.

## 2019-02-19 DIAGNOSIS — T8144XD Sepsis following a procedure, subsequent encounter: Secondary | ICD-10-CM | POA: Diagnosis not present

## 2019-02-19 DIAGNOSIS — C61 Malignant neoplasm of prostate: Secondary | ICD-10-CM | POA: Diagnosis not present

## 2019-02-19 DIAGNOSIS — Z1331 Encounter for screening for depression: Secondary | ICD-10-CM | POA: Diagnosis not present

## 2019-02-19 DIAGNOSIS — Z0000 Encounter for general adult medical examination without abnormal findings: Secondary | ICD-10-CM | POA: Diagnosis not present

## 2019-02-19 DIAGNOSIS — I429 Cardiomyopathy, unspecified: Secondary | ICD-10-CM | POA: Diagnosis not present

## 2019-02-19 DIAGNOSIS — I714 Abdominal aortic aneurysm, without rupture: Secondary | ICD-10-CM | POA: Diagnosis not present

## 2019-02-19 DIAGNOSIS — R7301 Impaired fasting glucose: Secondary | ICD-10-CM | POA: Diagnosis not present

## 2019-02-19 DIAGNOSIS — I10 Essential (primary) hypertension: Secondary | ICD-10-CM | POA: Diagnosis not present

## 2019-02-19 DIAGNOSIS — K819 Cholecystitis, unspecified: Secondary | ICD-10-CM | POA: Diagnosis not present

## 2019-02-19 DIAGNOSIS — G4733 Obstructive sleep apnea (adult) (pediatric): Secondary | ICD-10-CM | POA: Diagnosis not present

## 2019-02-19 DIAGNOSIS — E669 Obesity, unspecified: Secondary | ICD-10-CM | POA: Diagnosis not present

## 2019-02-19 DIAGNOSIS — I480 Paroxysmal atrial fibrillation: Secondary | ICD-10-CM | POA: Diagnosis not present

## 2019-02-19 DIAGNOSIS — E7800 Pure hypercholesterolemia, unspecified: Secondary | ICD-10-CM | POA: Diagnosis not present

## 2019-02-22 ENCOUNTER — Telehealth (HOSPITAL_COMMUNITY): Payer: Self-pay

## 2019-02-22 DIAGNOSIS — K819 Cholecystitis, unspecified: Secondary | ICD-10-CM | POA: Diagnosis not present

## 2019-02-22 DIAGNOSIS — K8031 Calculus of bile duct with cholangitis, unspecified, with obstruction: Secondary | ICD-10-CM | POA: Diagnosis not present

## 2019-02-22 DIAGNOSIS — F0390 Unspecified dementia without behavioral disturbance: Secondary | ICD-10-CM | POA: Diagnosis not present

## 2019-02-22 DIAGNOSIS — C61 Malignant neoplasm of prostate: Secondary | ICD-10-CM | POA: Diagnosis not present

## 2019-02-22 DIAGNOSIS — F05 Delirium due to known physiological condition: Secondary | ICD-10-CM | POA: Diagnosis not present

## 2019-02-22 DIAGNOSIS — G9341 Metabolic encephalopathy: Secondary | ICD-10-CM | POA: Diagnosis not present

## 2019-02-22 DIAGNOSIS — A4181 Sepsis due to Enterococcus: Secondary | ICD-10-CM | POA: Diagnosis not present

## 2019-02-22 DIAGNOSIS — K8050 Calculus of bile duct without cholangitis or cholecystitis without obstruction: Secondary | ICD-10-CM

## 2019-02-22 DIAGNOSIS — B9689 Other specified bacterial agents as the cause of diseases classified elsewhere: Secondary | ICD-10-CM | POA: Diagnosis not present

## 2019-02-22 MED ORDER — URSODIOL 300 MG PO CAPS: 900.0000 mg | ORAL_CAPSULE | Freq: Three times a day (TID) | ORAL | 3 refills | Status: DC

## 2019-02-22 NOTE — Telephone Encounter (Signed)
-----   Message from Milus Banister, MD sent at 02/22/2019  8:15 AM EDT ----- Myrle Sheng, Thanks for the message and for talking this through over the weekend.  I spoke with his daughter Ebony Hail and they are definitely interested in discussing (telemedicine?) his care with you further. We will put in a referral from our end, naming you specifically.  Tiffany, [place].  Case, FYI.   Dayna Geurts, Can you put in referral to IR for gallbladder stone discussion, Dr. Jacqulynn Cadet.  Also, he needs actigall called in.  300mg  pills, 3 pills once daily. Disp 90 with 6 refills.    Thanks all!     ----- Message ----- From: Jacqulynn Cadet, MD Sent: 02/19/2019   8:46 AM EDT To: Milus Banister, MD  Dan,  There is a procedure that's been out for a few years now called a percutaneous cholecystolithotripsy (PCCL).  It's similar to the perc nephrolithotomy that urology does for large renal stones.    You can either slowly dilate a perc chole tract up, or use a balloon and do it quickly and then go in with a large caliber sheath and, depending on the stone type and size, use a combination of basket snares, flushing, and even the rigid nephroscope with forceps and lithotripsy to get out bigger stones.    It's not main stream yet, but there are definitely case reports and patient series out there.  We have not done one here and haven't specifically trained in the procedure.   One thing we could try first as a more conservative approach is to bring him in and upsize his tube to a larger caliber and give him a script for flushes to help keep it from getting clogged.  We can also follow him more closely and bring him back every 4 weeks for a tube exchange.  If we can keep his tube open and draining, he shouldn't have issues with systemic bacteremia.    If you are ok with that plan, I'll have our team bring him in for the up-size.    Man, I love getting out on the bike.  It's been a challenge finding the time but its  so worth it.  We still need to ride together some time.  [place] ----- Message ----- From: Milus Banister, MD Sent: 02/18/2019   5:50 PM EDT To: Jacqulynn Cadet, MD  Hey,  Can you look over this guy's GB/biliary issues?  See my office note from 1-2 days ago, I think I summarized it pretty well.  Are there any IR approaches to crush, remove or push through his GB stones using his current perc GB drain tract to get at them?  Mind you that his cystic duct is obstructed on both IR drain check and my ERCP 2-3 weeks ago to clear out his CBD/CHD.  Thanks man,  Also it's nice to see that you're out of the trails[place] again, not just [place] it all the time.  Fresh air is good for the soul.   Radonna Ricker

## 2019-02-22 NOTE — Telephone Encounter (Signed)
Prescription sent to the pharmacy and message left with Tiffany at IR to set up appt requested.

## 2019-02-23 ENCOUNTER — Inpatient Hospital Stay: Admission: RE | Admit: 2019-02-23 | Payer: Medicare Other | Source: Ambulatory Visit

## 2019-02-23 NOTE — Telephone Encounter (Signed)
Left message with IR to return call

## 2019-02-25 ENCOUNTER — Telehealth (INDEPENDENT_AMBULATORY_CARE_PROVIDER_SITE_OTHER): Payer: Medicare Other | Admitting: Cardiovascular Disease

## 2019-02-25 ENCOUNTER — Encounter: Payer: Self-pay | Admitting: Cardiovascular Disease

## 2019-02-25 VITALS — BP 90/68 | HR 85 | Resp 20 | Ht 70.0 in

## 2019-02-25 DIAGNOSIS — I5022 Chronic systolic (congestive) heart failure: Secondary | ICD-10-CM | POA: Diagnosis not present

## 2019-02-25 DIAGNOSIS — I472 Ventricular tachycardia, unspecified: Secondary | ICD-10-CM

## 2019-02-25 DIAGNOSIS — Z95810 Presence of automatic (implantable) cardiac defibrillator: Secondary | ICD-10-CM

## 2019-02-25 DIAGNOSIS — K8309 Other cholangitis: Secondary | ICD-10-CM

## 2019-02-25 DIAGNOSIS — E785 Hyperlipidemia, unspecified: Secondary | ICD-10-CM

## 2019-02-25 DIAGNOSIS — I428 Other cardiomyopathies: Secondary | ICD-10-CM

## 2019-02-25 DIAGNOSIS — K8064 Calculus of gallbladder and bile duct with chronic cholecystitis without obstruction: Secondary | ICD-10-CM | POA: Diagnosis not present

## 2019-02-25 DIAGNOSIS — R54 Age-related physical debility: Secondary | ICD-10-CM | POA: Diagnosis not present

## 2019-02-25 DIAGNOSIS — Z952 Presence of prosthetic heart valve: Secondary | ICD-10-CM

## 2019-02-25 DIAGNOSIS — I2510 Atherosclerotic heart disease of native coronary artery without angina pectoris: Secondary | ICD-10-CM

## 2019-02-25 NOTE — Progress Notes (Signed)
Virtual Visit via Video Note   This visit type was conducted due to national recommendations for restrictions regarding the COVID-19 Pandemic (e.g. social distancing) in an effort to limit this patient's exposure and mitigate transmission in our community.  Due to his co-morbid illnesses, this patient is at least at moderate risk for complications without adequate follow up.  This format is felt to be most appropriate for this patient at this time.  All issues noted in this document were discussed and addressed.  A limited physical exam was performed with this format.  Please refer to the patient's chart for his consent to telehealth for [facility].   Date:  02/25/2019   ID:  William [name], DOB [id number], MRN [id number]  Patient Location: Home Provider Location: Home  PCP:  Gayland Curry, DO  Cardiologist:  Sanda Klein, MD  Electrophysiologist:  None   Evaluation Performed:  Follow-Up Visit  Chief Complaint:  CHF, VT  History of Present Illness:    William [name] is a 85 y.o. 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  He is a poor surgical candidate and has been managed with placement of a cholecystostomy January, followed by ERCP and sphincterotomy with common bile duct extraction in April.  The family is looking into a procedure that can be done an alternative [facility] where the gallstones can be crushed and removed through the cholecystostomy tube.  He is currently receiving ursodiol.  He has lost a lot of weight.  He is more alert than I remember him in the hospital, but clearly still has  cognitive difficulties.  His wife and his daughter participated in the video meeting today and helped along with the review of systems.  She has completed antibiotics.  He is afebrile and is eating.  His blood pressure remains low with a systolic blood pressure usually in the high 90s, low 100s and he is receiving both midodrine and carvedilol.  The patient does not have symptoms concerning for COVID-19 infection (fever, chills, cough, or new shortness of breath).    Past Medical History:  Diagnosis Date   AAA (abdominal aortic aneurysm) ([place])    7/13 3.8cm   Arthritis    "joints" (01/11/2014)   Asthma    "seasonal; some foods"    Chronic systolic CHF (congestive heart failure) (HCC)    Dementia (HCC)    HLD (hyperlipidemia)    HOH (hard of hearing)    Lumbar vertebral fracture (HCC)    L1- 04/11/2014    Prostate cancer (HCC)    S/P ICD (internal cardiac defibrillator) procedure, 01/11/14 removal of ERI gen and placement of Medtronic Evera XT VR & NEW Right ventricular lead Medtronic 01/12/2014   S/P TAVR (transcatheter aortic valve replacement)    Edwards Sapien 3 THV (size 29 mm, model # [id number], serial # [id number])   Severe aortic stenosis    Sleep apnea    "lost 60# & don't have it anymore" (01/11/2014)   Ventricular tachycardia [facility])    Past Surgical History:  Procedure Laterality Date   CARDIAC CATHETERIZATION  08/20/2004   noncritical CAD,mild global hypokinesis, EF 50%   CARDIAC DEFIBRILLATOR PLACEMENT  08/23/2004   Medtronic  CATARACT EXTRACTION W/ INTRAOCULAR LENS IMPLANT Right    COLECTOMY  1990's   CRYOABLATION N/A 02/24/2014   Procedure: CRYO ABLATION PROSTATE;  Surgeon: Ailene Rud, MD;  Location: [facility] ORS;  Service: Urology;  Laterality: N/A;   ERCP N/A 01/29/2019   Procedure: ENDOSCOPIC RETROGRADE CHOLANGIOPANCREATOGRAPHY (ERCP);  Surgeon: Milus Banister, MD;  Location: [facility] ENDOSCOPY;  Service: Endoscopy;  Laterality: N/A;   HERNIA REPAIR      "abdomen; from colon OR"   ICD LEAD REMOVAL N/A 09/15/2018   Procedure: ICD LEAD REMOVAL EXTRACTION;  Surgeon: Evans Lance, MD;  Location: [facility] OR;  Service: Cardiovascular;  Laterality: N/A;  DR. BARTLE TO BACK UP   IMPLANTABLE CARDIOVERTER DEFIBRILLATOR (ICD) GENERATOR CHANGE N/A 01/11/2014   Procedure: ICD GENERATOR CHANGE;  Surgeon: Sanda Klein, MD;  Location: [place] CATH LAB;  Service: Cardiovascular;  Laterality: N/A;   IR EXCHANGE BILIARY DRAIN  12/18/2018   IR EXCHANGE BILIARY DRAIN  01/05/2019   IR EXCHANGE BILIARY DRAIN  01/25/2019   IR PERC CHOLECYSTOSTOMY  11/22/2018   IR SINUS/FIST TUBE CHK-NON GI  01/28/2019   LEAD REVISION N/A 01/11/2014   Procedure: LEAD REVISION;  Surgeon: Sanda Klein, MD;  Location: [place] CATH LAB;  Service: Cardiovascular;  Laterality: N/A;   NM MYOCAR PERF WALL MOTION  01/29/2012   abnormal c/o infarct/scar,no ischemia present   PROSTATE BIOPSY N/A 11/22/2013   Procedure: PROSTATE BIOPSY AND ULTRASOUND;  Surgeon: Ailene Rud, MD;  Location: [facility] ORS;  Service: Urology;  Laterality: N/A;   REMOVAL OF STONES  01/29/2019   Procedure: REMOVAL OF STONES;  Surgeon: Milus Banister, MD;  Location: [facility] ENDOSCOPY;  Service: Endoscopy;;   RIGHT/LEFT HEART CATH AND CORONARY ANGIOGRAPHY N/A 07/06/2018   Procedure: RIGHT/LEFT HEART CATH AND CORONARY ANGIOGRAPHY;  Surgeon: Troy Sine, MD;  Location: [place] CV LAB;  Service: Cardiovascular;  Laterality: N/A;   SPHINCTEROTOMY  01/29/2019   Procedure: SPHINCTEROTOMY;  Surgeon: Milus Banister, MD;  Location: [facility] ENDOSCOPY;  Service: Endoscopy;;   TEE WITHOUT CARDIOVERSION N/A 08/18/2018   Procedure: TRANSESOPHAGEAL ECHOCARDIOGRAM (TEE);  Surgeon: Burnell Blanks, MD;  Location: [place];  Service: Open Heart Surgery;  Laterality: N/A;   TEE WITHOUT CARDIOVERSION N/A 09/08/2018   Procedure: TRANSESOPHAGEAL ECHOCARDIOGRAM (TEE);  Surgeon: Lelon Perla, MD;  Location: [facility] ENDOSCOPY;  Service:  Cardiovascular;  Laterality: N/A;   TEE WITHOUT CARDIOVERSION N/A 09/15/2018   Procedure: TRANSESOPHAGEAL ECHOCARDIOGRAM (TEE);  Surgeon: Evans Lance, MD;  Location: [facility] OR;  Service: Cardiovascular;  Laterality: N/A;   TEE WITHOUT CARDIOVERSION N/A 11/26/2018   Procedure: TRANSESOPHAGEAL ECHOCARDIOGRAM (TEE);  Surgeon: Jerline Pain, MD;  Location: [facility] ENDOSCOPY;  Service: Cardiovascular;  Laterality: N/A;   TRANSCATHETER AORTIC VALVE REPLACEMENT, TRANSFEMORAL  08/18/2018   TRANSCATHETER AORTIC VALVE REPLACEMENT, TRANSFEMORAL N/A 08/18/2018   Procedure: TRANSCATHETER AORTIC VALVE REPLACEMENT, TRANSFEMORAL using an Edwards 30mm Aortic Valve;  Surgeon: Burnell Blanks, MD;  Location: [place];  Service: Open Heart Surgery;  Laterality: N/A;     Current Meds  Medication Sig   acetaminophen (TYLENOL) 500 MG tablet Take 500 mg by mouth every 6 (six) hours as needed (for pain).    amiodarone (PACERONE) 200 MG tablet Take 0.5 tablets (100 mg total) by mouth daily.   amoxicillin-clavulanate (AUGMENTIN) 875-125 MG tablet Take 1 tablet by mouth 2 (two) times daily.   aspirin 81 MG chewable tablet Chew 1 tablet (81 mg total) by mouth daily.   carvedilol (COREG) 3.125 MG tablet  Take 1 tablet (3.125 mg total) by mouth 2 (two) times daily with a meal for 30 days.   cholecalciferol (VITAMIN D) 1000 units tablet Take 2,000 Units by mouth at bedtime.   midodrine (PROAMATINE) 5 MG tablet Take 1 tablet (5 mg total) by mouth 3 (three) times daily with meals for 30 days.   nystatin-triamcinolone (MYCOLOG II) cream Apply 1 application topically See admin instructions. Apply to right and left buttocks twice daily for 7 days - stop date 01/25/19   pantoprazole (PROTONIX) 40 MG tablet Take 1 tablet (40 mg total) by mouth at bedtime for 30 days.   polyethylene glycol (MIRALAX / GLYCOLAX) packet Take 17 g by mouth every evening.   sodium chloride flush (NS) 0.9 % SOLN flush drain once daily with 5-10  cc sterile saline Record output daily   sulfamethoxazole-trimethoprim (BACTRIM DS) 800-160 MG tablet Take 1 tablet by mouth 2 (two) times daily.   triamcinolone (KENALOG) 0.025 % cream Apply 1 application topically 2 (two) times daily.    ursodiol (ACTIGALL) 300 MG capsule Take 3 capsules (900 mg total) by mouth 3 (three) times daily for 30 days.   vitamin B-12 (CYANOCOBALAMIN) 250 MCG tablet Take 1 tablet (250 mcg total) by mouth daily for 30 days.     Allergies:   Crestor [rosuvastatin]; Lipitor [atorvastatin]; and Shrimp [shellfish allergy]   Social History   Tobacco Use   Smoking status: Never Smoker   Smokeless tobacco: Never Used  Substance Use Topics   Alcohol use: Yes    Alcohol/week: 0.0 standard drinks    Comment: 01/11/2014 "drink 1/2 of a beer twice per month    Drug use: No     Family Hx: The patient's family history includes Heart failure in his mother; Pneumonia in his father.  ROS:   Please see the history of present illness.     All other systems reviewed and are negative.   Prior CV studies:   The following studies were reviewed today:  Notes from multiple specialists including general surgery and gastroenterology and infectious diseases.  Labs/Other Tests and Data Reviewed:    EKG:  An ECG dated 01/25/2019 was personally reviewed today and demonstrated:  Sinus tachycardia left bundle branch block  Recent Labs: 11/21/2018: B Natriuretic Peptide 332.5 02/03/2019: TSH 1.596 02/08/2019: ALT 24; BUN 16; Creatinine, Ser 0.90; Hemoglobin 11.9; Magnesium 2.1; Platelets 130; Potassium 3.3; Sodium 135   Recent Lipid Panel Lab Results  Component Value Date/Time   CHOL 140 01/27/2019 04:25 AM   TRIG 91 01/27/2019 04:25 AM   HDL 22 (L) 01/27/2019 04:25 AM   CHOLHDL 6.4 01/27/2019 04:25 AM   LDLCALC 100 (H) 01/27/2019 04:25 AM    Wt Readings from Last 3 Encounters:  02/16/19 203 lb (92.1 kg)  02/06/19 199 lb 1.2 oz (90.3 kg)  01/12/19 203 lb (92.1 kg)      Objective:    Vital Signs:  BP 90/68    Pulse 85    Resp 20    Ht 5\' 10"  (1.778 m)    SpO2 98%    BMI 29.13 kg/m    VITAL SIGNS:  reviewed GEN:  no acute distress EYES:  sclerae anicteric, EOMI - Extraocular Movements Intact RESPIRATORY:  normal respiratory effort, symmetric expansion CARDIOVASCULAR:  no peripheral edema SKIN:  no rash, lesions or ulcers. MUSCULOSKELETAL:  no obvious deformities. NEURO:  He is alert, appears to recognize me and realizes that he is at home and remembers that he was in the  hospital.  Otherwise appears a little disoriented, not really understanding the circumstances of this encounter for his recent health problems. PSYCH:  Affect appears to be a little flat.  He is distracted.  ASSESSMENT & PLAN:    1. Recurrent sepsis in setting of biliary disease -this is indeed likely to recur until his gallbladder is removed.  I think he would be at high risk for open laparotomy or even laparoscopic cholecystectomy.  His surgeons do not think he is a good candidate for laparoscopic interventions anyway, due to his previous colectomy.  The family is looking into alternative procedures that can be performed in [place].  They are clearly still pursuing aggressive intervention and are not yet in "palliative care mode".  2. NICM/chronic systolic CHF - although difficult to assess based on the current exam I believe he is euvolemic.  He does not have any respiratory difficulty.  In the past he has generally tolerated his decreased ejection fraction well.  3. Ventricular tachycardia - remains on low dose amiodarone.  If this attempts to wean off this medication led to recurrent ventricular tachycardia.  He no longer has a defibrillator and one should not be implanted.  4. Severe AS s/p TAVR - TEE 11/2018 showed normal AV prosthesis.  He was due to stop his clopidogrel this month anyway and we will not restart it.  Unfortunately, his recurrent episodes of bacteremia place  him at risk for endocarditis of the biological prosthesis.  5. CAD -moderate by recent cardiac catheterization, no angina pectoris.  6. Mild anemia/thrombocytopenia -preceded the gallbladder problems, appears relatively stable last several days.  7. Hypokalemia - he had repeat labs showing a potassium of 3.7 and creatinine of 0.9 on May 11.  Her potassium remains borderline low and this may increase his predisposition to ventricular arrhythmia.  We will place him on a daily potassium supplement.  8. AAA - 4.2cm x 5.2cm imaged during his recent admission.  I do not think he will ever be a candidate for either surgical or graft exclusion and I do not think there is any reason to follow this routinely.  9.  Hypotension -he does not make any sense for him to still take carvedilol while he is receiving midodrine.  He will stop the beta-blocker   COVID-19 Education: The signs and symptoms of COVID-19 were discussed with the patient and how to seek care for testing (follow up with PCP or arrange E-visit).  The importance of social distancing was discussed today.  Time:   Today, I have spent 27 minutes with the patient with telehealth technology discussing the above problems.     Medication Adjustments/Labs and Tests Ordered: Current medicines are reviewed at length with the patient today.  Concerns regarding medicines are outlined above.   Tests Ordered: No orders of the defined types were placed in this encounter.   Medication Changes: No orders of the defined types were placed in this encounter.   Disposition:  Follow up   Friday 05/21/19 at 11:40 am.  SignedSanda Klein, MD  02/25/2019 10:13 AM    [place]

## 2019-02-25 NOTE — Patient Instructions (Addendum)
Medication Instructions:  Stop Carvedilol after Fri 5/15 am dose  Start Potassium 10 meq daily  If you need a refill on your cardiac medications before your next appointment, please call your pharmacy.   Lab work: [facility] in 1 month 6/15  [place] will obtain lab    Testing/Procedures: None ordered  Follow-Up: At [facility], you and your health needs are our priority.  As part of our continuing mission to provide you with exceptional heart care, we have created designated Provider Care Teams.  These Care Teams include your primary Cardiologist (physician) and Advanced Practice Providers (APPs -  Physician Assistants and Nurse Practitioners) who all work together to provide you with the care you need, when you need it.    Follow up appointment scheduled with Dr.Croitoru  Friday 05/21/19 at 11:40 am.

## 2019-02-26 ENCOUNTER — Ambulatory Visit
Admission: RE | Admit: 2019-02-26 | Discharge: 2019-02-26 | Disposition: A | Discharge status: Home / Self Care | Payer: Medicare Other | Source: Ambulatory Visit | Attending: Gastroenterology | Admitting: Gastroenterology

## 2019-02-26 ENCOUNTER — Telehealth: Payer: Self-pay | Admitting: *Deleted

## 2019-02-26 ENCOUNTER — Encounter: Payer: Self-pay | Admitting: *Deleted

## 2019-02-26 DIAGNOSIS — B9689 Other specified bacterial agents as the cause of diseases classified elsewhere: Secondary | ICD-10-CM | POA: Diagnosis not present

## 2019-02-26 DIAGNOSIS — K8050 Calculus of bile duct without cholangitis or cholecystitis without obstruction: Secondary | ICD-10-CM

## 2019-02-26 DIAGNOSIS — K8031 Calculus of bile duct with cholangitis, unspecified, with obstruction: Secondary | ICD-10-CM | POA: Diagnosis not present

## 2019-02-26 DIAGNOSIS — G9341 Metabolic encephalopathy: Secondary | ICD-10-CM | POA: Diagnosis not present

## 2019-02-26 DIAGNOSIS — F0390 Unspecified dementia without behavioral disturbance: Secondary | ICD-10-CM | POA: Diagnosis not present

## 2019-02-26 DIAGNOSIS — K8001 Calculus of gallbladder with acute cholecystitis with obstruction: Secondary | ICD-10-CM | POA: Diagnosis not present

## 2019-02-26 DIAGNOSIS — A4181 Sepsis due to Enterococcus: Secondary | ICD-10-CM | POA: Diagnosis not present

## 2019-02-26 DIAGNOSIS — F05 Delirium due to known physiological condition: Secondary | ICD-10-CM | POA: Diagnosis not present

## 2019-02-26 HISTORY — PX: IR RADIOLOGIST EVAL & MGMT: IMG5224

## 2019-02-26 NOTE — Telephone Encounter (Signed)
I dont understand completely. I thought I had changed him to pills? I understand he is having some other attempts to get rid of his GB stones. We probably should set up an evisit if possible next week

## 2019-02-26 NOTE — Progress Notes (Signed)
Chief Complaint: Patient was consulted remotely today (TeleHealth) for gallstones, intermittent sepsis at the request of [place].    Referring Physician(s): Jacobs,Daniel P  History of Present Illness: [name] is a 85 y.o. male who initially presented in February 2020 with sepsis which was identified to be secondary to acute calculus cholecystitis.  The patient was evaluated by general surgery and deemed to be a extremely high risk surgical candidate.  Therefore, he was treated by placement of a percutaneous cholecystostomy tube on 11/22/2018.  His first drain exchange was performed on 12/18/2018.  At that time, the drainage catheter was found to be partially occluded and somewhat retracted.  The drain was successfully exchanged for a new 10 French drainage catheter.  The patient returned to interventional radiology 3 weeks later on 01/05/2019 with recurrent partial retraction of the cholecystostomy tube.  The drain was exchanged and upsized to 12 [place] and also secured to the skin with an 0 Prolene suture.  Again, patient did well for another 3 weeks before developing recurrent signs of sepsis requiring admission in April 2020.  On 01/25/2019 patient underwent another biliary tube exchange which demonstrated an occluded biliary drain.  The biliary drain was successfully exchanged and maintained at 1 [place].  Additionally, MRCP imaging at that time revealed choledocholithiasis which was successfully treated by ERCP on 01/29/2019.  Post anesthesia recovery was complicated by delirium.  Following discharge after his most recent episode, his home health nurse has been flushing his drainage catheter twice daily.  It is reported that the drainage catheter flushes easily and without any evidence of obstruction.  Draining bile is clear and not purulent in appearance.  [name] is also doing very well and has recovered from his delirium.  He is at his baseline state of usual health.   Dr. Ardis Hughs recently started him on Actigall in an effort to thin his bile and facilitate drainage.  Additionally, Dr. Drucilla Schmidt has started him on prophylactic Bactrim and Augmentin.  We are having a tele-consultation today to discuss strategies for optimal tube management versus a more aggressive approach including percutaneous cholecystolithotripsy.  Past Medical History:  Diagnosis Date   AAA (abdominal aortic aneurysm) ([place])    7/13 3.8cm   Arthritis    "joints" (01/11/2014)   Asthma    "seasonal; some foods"    Chronic systolic CHF (congestive heart failure) (HCC)    Dementia (HCC)    HLD (hyperlipidemia)    HOH (hard of hearing)    Lumbar vertebral fracture (HCC)    L1- 04/11/2014    Prostate cancer (HCC)    S/P ICD (internal cardiac defibrillator) procedure, 01/11/14 removal of ERI gen and placement of Medtronic Evera XT VR & NEW Right ventricular lead Medtronic 01/12/2014   S/P TAVR (transcatheter aortic valve replacement)    Edwards Sapien 3 THV (size 29 mm, model # [id number], serial # [id number])   Severe aortic stenosis    Sleep apnea    "lost 60# & don't have it anymore" (01/11/2014)   Ventricular tachycardia [facility])     Past Surgical History:  Procedure Laterality Date   CARDIAC CATHETERIZATION  08/20/2004   noncritical CAD,mild global hypokinesis, EF 50%   CARDIAC DEFIBRILLATOR PLACEMENT  08/23/2004   Medtronic   CATARACT EXTRACTION W/ INTRAOCULAR LENS IMPLANT Right    COLECTOMY  1990's   CRYOABLATION N/A 02/24/2014   Procedure: CRYO ABLATION PROSTATE;  Surgeon: Ailene Rud, MD;  Location: [facility] ORS;  Service: Urology;  Laterality: N/A;  ERCP N/A 01/29/2019   Procedure: ENDOSCOPIC RETROGRADE CHOLANGIOPANCREATOGRAPHY (ERCP);  Surgeon: Milus Banister, MD;  Location: [facility] ENDOSCOPY;  Service: Endoscopy;  Laterality: N/A;   HERNIA REPAIR     "abdomen; from colon OR"   ICD LEAD REMOVAL N/A 09/15/2018   Procedure: ICD LEAD REMOVAL EXTRACTION;  Surgeon:  Evans Lance, MD;  Location: [facility] OR;  Service: Cardiovascular;  Laterality: N/A;  DR. BARTLE TO BACK UP   IMPLANTABLE CARDIOVERTER DEFIBRILLATOR (ICD) GENERATOR CHANGE N/A 01/11/2014   Procedure: ICD GENERATOR CHANGE;  Surgeon: Sanda Klein, MD;  Location: [place] CATH LAB;  Service: Cardiovascular;  Laterality: N/A;   IR EXCHANGE BILIARY DRAIN  12/18/2018   IR EXCHANGE BILIARY DRAIN  01/05/2019   IR EXCHANGE BILIARY DRAIN  01/25/2019   IR PERC CHOLECYSTOSTOMY  11/22/2018   IR RADIOLOGIST EVAL & MGMT  02/26/2019   IR SINUS/FIST TUBE CHK-NON GI  01/28/2019   LEAD REVISION N/A 01/11/2014   Procedure: LEAD REVISION;  Surgeon: Sanda Klein, MD;  Location: [place] CATH LAB;  Service: Cardiovascular;  Laterality: N/A;   NM MYOCAR PERF WALL MOTION  01/29/2012   abnormal c/o infarct/scar,no ischemia present   PROSTATE BIOPSY N/A 11/22/2013   Procedure: PROSTATE BIOPSY AND ULTRASOUND;  Surgeon: Ailene Rud, MD;  Location: [facility];  Service: Urology;  Laterality: N/A;   REMOVAL OF STONES  01/29/2019   Procedure: REMOVAL OF STONES;  Surgeon: Milus Banister, MD;  Location: [facility] ENDOSCOPY;  Service: Endoscopy;;   RIGHT/LEFT HEART CATH AND CORONARY ANGIOGRAPHY N/A 07/06/2018   Procedure: RIGHT/LEFT HEART CATH AND CORONARY ANGIOGRAPHY;  Surgeon: Troy Sine, MD;  Location: [place] CV LAB;  Service: Cardiovascular;  Laterality: N/A;   SPHINCTEROTOMY  01/29/2019   Procedure: SPHINCTEROTOMY;  Surgeon: Milus Banister, MD;  Location: [facility] ENDOSCOPY;  Service: Endoscopy;;   TEE WITHOUT CARDIOVERSION N/A 08/18/2018   Procedure: TRANSESOPHAGEAL ECHOCARDIOGRAM (TEE);  Surgeon: Burnell Blanks, MD;  Location: [place];  Service: Open Heart Surgery;  Laterality: N/A;   TEE WITHOUT CARDIOVERSION N/A 09/08/2018   Procedure: TRANSESOPHAGEAL ECHOCARDIOGRAM (TEE);  Surgeon: Lelon Perla, MD;  Location: [facility] ENDOSCOPY;  Service: Cardiovascular;  Laterality: N/A;   TEE WITHOUT CARDIOVERSION N/A 09/15/2018     Procedure: TRANSESOPHAGEAL ECHOCARDIOGRAM (TEE);  Surgeon: Evans Lance, MD;  Location: [facility] OR;  Service: Cardiovascular;  Laterality: N/A;   TEE WITHOUT CARDIOVERSION N/A 11/26/2018   Procedure: TRANSESOPHAGEAL ECHOCARDIOGRAM (TEE);  Surgeon: Jerline Pain, MD;  Location: [facility] ENDOSCOPY;  Service: Cardiovascular;  Laterality: N/A;   TRANSCATHETER AORTIC VALVE REPLACEMENT, TRANSFEMORAL  08/18/2018   TRANSCATHETER AORTIC VALVE REPLACEMENT, TRANSFEMORAL N/A 08/18/2018   Procedure: TRANSCATHETER AORTIC VALVE REPLACEMENT, TRANSFEMORAL using an Edwards 77mm Aortic Valve;  Surgeon: Burnell Blanks, MD;  Location: [place];  Service: Open Heart Surgery;  Laterality: N/A;    Allergies: Crestor [rosuvastatin]; Lipitor [atorvastatin]; and Shrimp [shellfish allergy]  Medications: Prior to Admission medications   Medication Sig Start Date End Date Taking? Authorizing Provider  acetaminophen (TYLENOL) 500 MG tablet Take 500 mg by mouth every 6 (six) hours as needed (for pain).     [provider]  amiodarone (PACERONE) 200 MG tablet Take 0.5 tablets (100 mg total) by mouth daily. 11/16/18   Croitoru, Mihai, MD  amoxicillin-clavulanate (AUGMENTIN) 875-125 MG tablet Take 1 tablet by mouth 2 (two) times daily.    [provider]  aspirin 81 MG chewable tablet Chew 1 tablet (81 mg total) by mouth daily. 07/08/18   Roby Lofts  M., PA-C  cholecalciferol (VITAMIN D) 1000 units tablet Take 2,000 Units by mouth at bedtime.    [provider]  midodrine (PROAMATINE) 5 MG tablet Take 1 tablet (5 mg total) by mouth 3 (three) times daily with meals for 30 days. 02/08/19 03/10/19  Elodia Florence., MD  nystatin-triamcinolone [facility] II) cream Apply 1 application topically See admin instructions. Apply to right and left buttocks twice daily for 7 days - stop date 01/25/19    [provider]  pantoprazole (PROTONIX) 40 MG tablet Take 1 tablet (40 mg total) by mouth at  bedtime for 30 days. 02/08/19 03/10/19  Elodia Florence., MD  polyethylene glycol [facility]avel[facility]) packet Take 17 g by mouth every evening. 01/21/17   Croitoru, Mihai, MD  potassium chloride (K-DUR) 10 MEQ tablet Take 1 tablet (10 mEq total) by mouth daily. 02/25/19   Croitoru, Mihai, MD  sodium chloride flush (NS) 0.9 % SOLN flush drain once daily with 5-10 cc sterile saline Record output daily 02/08/19   Elodia Florence., MD  sulfamethoxazole-trimethoprim (BACTRIM DS) 800-160 MG tablet Take 1 tablet by mouth 2 (two) times daily.    [provider]  triamcinolone (KENALOG) 0.025 % cream Apply 1 application topically 2 (two) times daily.     [provider]  ursodiol (ACTIGALL) 300 MG capsule Take 3 capsules (900 mg total) by mouth 3 (three) times daily for 30 days. 02/22/19 03/24/19  Milus Banister, MD  vitamin B-12 (CYANOCOBALAMIN) 250 MCG tablet Take 1 tablet (250 mcg total) by mouth daily for 30 days. 02/09/19 03/11/19  Elodia Florence., MD     Family History  Problem Relation Age of Onset   Heart failure Mother        Died of "old age" at 34   Pneumonia Father     Social History   Socioeconomic History   Marital status: Married    Spouse name: Not on file   Number of children: 2   Years of education: Not on file   Highest education level: Not on file  Occupational History   Occupation: Owned a Scientist, [employer]  Social Needs   Emergency planning/management officer strain: Not on file   Food insecurity:    Worry: Not on file    Inability: Not on file   Transportation needs:    Medical: Not on file    Non-medical: Not on file  Tobacco Use   Smoking status: Never Smoker   Smokeless tobacco: Never Used  Substance and Sexual Activity   Alcohol use: Yes    Alcohol/week: 0.0 standard drinks    Comment: 01/11/2014 "drink 1/2 of a beer twice per month    Drug use: No   Sexual activity: Not Currently  Lifestyle   Physical activity:    Days  per week: Not on file    Minutes per session: Not on file   Stress: Not on file  Relationships   Social connections:    Talks on phone: Not on file    Gets together: Not on file    Attends religious service: Not on file    Active member of club or organization: Not on file    Attends meetings of clubs or organizations: Not on file    Relationship status: Not on file  Other Topics Concern   Not on file  Social History Narrative   Not on file    Review of Systems  Review of Systems: A 12  point ROS discussed and pertinent positives are indicated in the HPI above.  All other systems are negative.  Physical Exam No direct physical exam was performed (except for noted visual exam findings with Video Visits).   Vital Signs: There were no vitals taken for this visit.  Imaging: Dg Forearm Left  Result Date: 02/03/2019 CLINICAL DATA:  Left arm pain. EXAM: LEFT FOREARM - 2 VIEW COMPARISON:  None. FINDINGS: There is no evidence of fracture or other focal bone lesions. Vascular calcifications are noted. IMPRESSION: No significant abnormality seen in the left forearm. Electronically Signed   By: Marijo Conception M.D.   On: 02/03/2019 15:46   Dg Wrist 2 Views Left  Result Date: 02/05/2019 CLINICAL DATA:  Left wrist pain. EXAM: LEFT WRIST - 2 VIEW COMPARISON:  Radiographs of February 03, 2019. FINDINGS: There is no evidence of fracture or dislocation. Vascular calcifications are noted. Mild degenerative changes seen involving the first carpometacarpal joint as well as the scaphoid trapezoid space. IMPRESSION: Mild osteoarthritis as described above. No acute abnormality seen in the left wrist. Electronically Signed   By: Marijo Conception M.D.   On: 02/05/2019 17:01   Ct Head Wo Contrast  Result Date: 02/02/2019 CLINICAL DATA:  85 year old who underwent sphincterotomy and bile duct stone removal on 01/29/2019, current history of sepsis secondary to cholecystitis and cholangitis with an indwelling  cholecystostomy tube, now with acute mental status changes, possibly related to metabolic encephalopathy. EXAM: CT HEAD WITHOUT CONTRAST TECHNIQUE: Contiguous axial images were obtained from the base of the skull through the vertex without intravenous contrast. COMPARISON:  12/12/2018 and earlier. FINDINGS: Brain: Patient's head is turned to the LEFT. Severe cortical, deep and cerebellar atrophy, unchanged since December, 2019, but progressive since 2014. Moderate to severe changes of small vessel as severe changes of small vessel disease of the white matter diffusely, unchanged. Physiologic calcifications in the basal ganglia. No mass lesion. No midline shift. No acute hemorrhage or hematoma. No extra-axial fluid collections. No evidence of acute infarction. Vascular: Severe BILATERAL carotid siphon and vertebral artery atherosclerosis. Severe atherosclerosis involving MIDDLE cerebral arteries. 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 No hyperdense vessel. Skull: No skull fracture or other focal osseous abnormality involving the skull. Sinuses/Orbits: Visualized paranasal sinuses, bilateral mastoid air cells and bilateral middle ear cavities well-aerated. Benign calcified senile plaques in both eyes. Other: None. IMPRESSION: 1. No acute intracranial abnormality. 2. Severe generalized atrophy and moderate to severe chronic microvascular ischemic changes of the white matter. 3. 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 Electronically Signed   By: Evangeline Dakin M.D.   On: 02/02/2019 14:19   Mr Brain Wo Contrast  Result Date: 02/02/2019 CLINICAL DATA:  Altered mental status EXAM: MRI HEAD WITHOUT CONTRAST TECHNIQUE: Multiplanar, multiecho pulse sequences of the brain and surrounding structures were obtained without intravenous contrast. COMPARISON:  CT head 02/02/2019 FINDINGS: Brain:  Image quality degraded by significant motion. Moderate to advanced atrophy. Ventricular enlargement consistent with atrophy. Negative for acute infarct. Moderate chronic microvascular ischemic changes in the white matter. Small chronic infarcts in the right cerebellum. Negative for hemorrhage or mass. No midline shift or fluid collection Vascular: Normal arterial flow voids. Atherosclerotic ectasia of the basilar artery as noted previously. Skull and upper cervical spine: Negative Sinuses/Orbits: Paranasal sinuses clear.  Bilateral cataract surgery Other: None IMPRESSION: Atrophy and chronic ischemic change. No acute intracranial abnormality  Image quality degraded by motion. Electronically Signed   By: Franchot Gallo M.D.   On: 02/02/2019 15:33   Ir Sinus/fist Tube Chk-non Gi  Result Date: 01/28/2019 INDICATION: 85 year old with cholecystostomy tube that was recently replaced. The retention suture has been pulled out of the skin. EXAM: DRAIN INJECTION WITH FLUOROSCOPY COMPARISON:  None. MEDICATIONS: None ANESTHESIA/SEDATION: None COMPLICATIONS: None immediate. TECHNIQUE: Drain was injected with contrast under fluoroscopy. 10 mL Omnipaque 300 was injected. PROCEDURE: The drain and surrounding skin was prepped with chlorhexidine. A new 0-Prolene suture was used to secure the catheter to the skin. FINDINGS: Cholecystostomy tube remains within the gallbladder. Again noted are multiple filling defects compatible with gallstones. IMPRESSION: Cholecystostomy tube remains in the gallbladder. Drain was secured to skin with new suture. Electronically Signed   By: Markus Daft M.D.   On: 01/28/2019 17:47   Dg Shoulder Left  Result Date: 02/03/2019 CLINICAL DATA:  Left shoulder pain. EXAM: LEFT SHOULDER - 2+ VIEW COMPARISON:  Radiographs of October 10, 2018. FINDINGS: There is no evidence of fracture or dislocation. Moderate degenerative changes seen involving the left glenohumeral joint. Soft tissues are unremarkable.  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 No acute abnormality seen in the left shoulder. Electronically Signed   By: Marijo Conception M.D.   On: 02/03/2019 15:50   Dg Ercp Biliary & Pancreatic Ducts  Result Date: 01/29/2019 CLINICAL DATA:  85 year old male with choledocholithiasis undergoing ERCP. EXAM: ERCP TECHNIQUE: Multiple spot images obtained with the fluoroscopic device and submitted for interpretation post-procedure. FLUOROSCOPY TIME:  Fluoroscopy Time:  3 minutes 14 seconds. COMPARISON:  MRCP 01/27/2019. FINDINGS: A total of 5 intraoperative spot views are submitted for review. The images demonstrate a flexible endoscope in the descending duodenum followed by wire cannulation of the common bile duct and cholangiogram. Multiple filling defects are present throughout the common bile duct consistent with choledocholithiasis. Subsequent images demonstrate sphincterotomy and balloon sweeping of the common duct. A pigtail drainage catheter is also evident in the right upper quadrant consistent with the recently placed percutaneous cholecystostomy tube. IMPRESSION: ERCP with sphincterotomy and balloon sweep of the common duct. These images were submitted for radiologic interpretation only. Please see the procedural report for the amount of contrast and the fluoroscopy time utilized. Electronically Signed   By: Jacqulynn Cadet M.D.   On: 01/29/2019 11:27   Ir Radiologist Eval & Mgmt  Result Date: 02/26/2019 Please refer to notes tab for details about interventional procedure. (Op Note)   Labs:  CBC: Recent Labs    02/05/19 0626 02/06/19 0242 02/07/19 0255 02/08/19 0327  WBC 6.2 5.8 5.6 5.7  HGB 11.0* 11.3* 12.5* 11.9*  HCT 32.4* 32.4* 37.2* 33.8*  PLT 131* 138* 153 130*    COAGS: Recent Labs    08/07/18 1411 08/13/18 0409  11/21/18 0851 11/22/18 1229 01/24/19 2218 02/01/19 0404  INR 1.25 1.09   < > 1.33 1.39 1.2 1.2  APTT 34 37*  --  34  --  31   --    < > = values in this interval not displayed.    BMP: Recent Labs    02/05/19 0626 02/06/19 0242 02/07/19 0255 02/08/19 0327  NA 137 136 135 135  K 3.3* 3.6 4.0 3.3*  CL 104 106 104 101  CO2 22 22 21* 24  GLUCOSE 121* 125* 119* 107*  BUN 18 17 16 16   CALCIUM 8.2* 8.2* 8.4* 8.5*  CREATININE 0.88 0.88 0.95 0.90  GFRNONAA >60 >60 >  60 >60  GFRAA >60 >60 >60 >60    LIVER FUNCTION TESTS: Recent Labs    02/05/19 0626 02/06/19 0242 02/07/19 0255 02/08/19 0327  BILITOT 0.8 0.9 0.9 1.2  AST 23 30 33 26  ALT 19 24 25 24   ALKPHOS 128* 120 121 114  PROT 5.6* 5.8* 6.0* 5.9*  ALBUMIN 2.0* 2.1* 2.2* 2.2*    TUMOR MARKERS: No results for input(s): AFPTM, CEA, CA199, CHROMGRNA in the last 8760 hours.  Assessment and Plan:  Leaf Kernodle is an 85 year old gentleman with gallstones and a history of recurrent sepsis.  I had a long conversation with his wife and 2 daughters today regarding potential treatment strategies.  He has been deemed a nonsurgical candidate and cannot undergo cholecystectomy.  His choledocholithiasis was successfully treated by ERCP performed by Dr. Ardis Hughs.  He has numerous gallstones in his gallbladder as well as persistent obstruction of the cystic duct on his most recent tube exchange on 01/29/2019.  His infectious disease doctor, Dr. Tommy Medal is concerned that his gallstones are colonized and pose a continued risk for sepsis should his biliary drain become clogged or malfunction.  For this reason, Mr. Barnhill is currently on p.o. Augmentin and Bactrim daily for prophylaxis.  I described in detail the pathophysiology of gallstone mediated cholecystitis and sepsis.  Sepsis typically only occurs when there is insufficient drainage from the gallbladder and the bile was allowed to become stagnant facilitating bacterial overgrowth and translocation of bacteria into the bloodstream resulting in bacteremia and sepsis.  As long as the percutaneous drainage catheter  is functioning as intended, this provides a path of least resistance for the bile to exit the gallbladder and body and should prevent sepsis from occurring.  In fact, the patient has done very well over the past month since his last tube exchange and upsize.  Currently, he has a 28 [place] biliary drain in place which is being flushed twice daily by his home health nurse who reports that the drainage catheter is flushing easily.  His bile drainage is fairly clear and not purulent or mucousy in appearance.  I described that bile can have varying consistency and amounts of cholesterol and bile salts from individual to individual and that in any 1 individual there bile may be more or less likely to form sediment and sludge that can clog a cholecystostomy tube.  To combat this, we have the ability to use tubes of various sizes with larger tubes being more resistant to obstruction.  We also have the ability to schedule tube exchanges more frequently in order to minimize the risk of tube malfunction.  Additionally, there is a procedure known as a percutaneous choleycystolithotripsy (PCCL) during which the percutaneous tract into the gallbladder is dilated to 77 [place] and then the gallstones are removed percutaneously using a combination of baskets, snares, water pressure, suction and occasionally laser or pneumatic lithotripsy.  This is a more invasive procedure and often requires at least moderate sedation.  We have minimal experience with this procedure in [place] but feel we would be able to proceed with this intervention if it became necessary.  Mr. Showman's family also mentioned that they are aware of a specialist, Dr. Jackquline Denmark, at [facility] who was recommended to them by a personal friend and is reported to have significant experience with PCCL.  After answering all questions and arriving at our final plan together, we will next do the following:  1.)  Schedule for tube exchange  with  possible upsize and possible culture of bile to be performed next week at [facility] by my partner Dr. Arne Cleveland.  If the bile appears purulent or concerning, a sample will be sent for culture.  No culture necessary if the bile appears normal.  The tube may be upsized if clinically warranted at that time.  Family to continue flushing the tube twice daily.  2.)  Referral to Dr. Jackquline Denmark at [facility] for a second opinion on PCCL.  3.)  Follow-up clinic visit with me on June 3.   Thank you for this interesting consult.  I greatly enjoyed meeting BODHI MORADI and look forward to participating in their care.  A copy of this report was sent to the requesting provider on this date.  Electronically Signed: Jacqulynn Cadet 02/26/2019, 4:16 PM   I spent a total of  40 Minutes  in remote clinical consultation, greater than 50% of which was counseling/coordinating care for gallstones  Visit type: Audio only (telephone). Audio (no video) only due to patient's lack of internet/smartphone capability. Alternative for in-person consultation at [facility], [address], [place], [place]. This visit type was conducted due to national recommendations for restrictions regarding the COVID-19 Pandemic (e.g. social distancing).  This format is felt to be most appropriate for this patient at this time.  All issues noted in this document were discussed and addressed.

## 2019-02-26 NOTE — Telephone Encounter (Signed)
Mary at [facility] is calling for verbal orders to continue IV access (midline) for labs through 03/21/2019, maintaining midline per protocol. Please advise. Landis Gandy, RN

## 2019-03-01 ENCOUNTER — Other Ambulatory Visit (HOSPITAL_COMMUNITY): Payer: Self-pay | Admitting: Interventional Radiology

## 2019-03-01 ENCOUNTER — Other Ambulatory Visit: Payer: Self-pay | Admitting: Interventional Radiology

## 2019-03-01 ENCOUNTER — Encounter (HOSPITAL_COMMUNITY): Payer: Self-pay | Admitting: Interventional Radiology

## 2019-03-01 ENCOUNTER — Telehealth: Payer: Self-pay | Admitting: *Deleted

## 2019-03-01 ENCOUNTER — Ambulatory Visit (HOSPITAL_COMMUNITY)
Admission: RE | Admit: 2019-03-01 | Discharge: 2019-03-01 | Disposition: A | Discharge status: Home / Self Care | Payer: Medicare Other | Source: Ambulatory Visit | Attending: Interventional Radiology | Admitting: Interventional Radiology

## 2019-03-01 ENCOUNTER — Other Ambulatory Visit: Payer: Self-pay

## 2019-03-01 ENCOUNTER — Telehealth: Payer: Self-pay | Admitting: Internal Medicine

## 2019-03-01 ENCOUNTER — Other Ambulatory Visit: Payer: Self-pay | Admitting: Radiology

## 2019-03-01 DIAGNOSIS — K810 Acute cholecystitis: Secondary | ICD-10-CM | POA: Diagnosis not present

## 2019-03-01 DIAGNOSIS — K8050 Calculus of bile duct without cholangitis or cholecystitis without obstruction: Secondary | ICD-10-CM

## 2019-03-01 DIAGNOSIS — R52 Pain, unspecified: Secondary | ICD-10-CM | POA: Diagnosis not present

## 2019-03-01 DIAGNOSIS — Z4803 Encounter for change or removal of drains: Secondary | ICD-10-CM | POA: Insufficient documentation

## 2019-03-01 DIAGNOSIS — R5381 Other malaise: Secondary | ICD-10-CM | POA: Diagnosis not present

## 2019-03-01 HISTORY — PX: IR CATHETER TUBE CHANGE: IMG717

## 2019-03-01 MED ORDER — SODIUM CHLORIDE 0.9% FLUSH: 5.0000 mL | Freq: Three times a day (TID) | INTRAVENOUS | Status: DC

## 2019-03-01 MED ORDER — IOHEXOL 300 MG/ML  SOLN
50.0000 mL | Freq: Once | INTRAMUSCULAR | Status: AC | PRN
  Administered 2019-03-01: 5 mL

## 2019-03-01 MED ORDER — LIDOCAINE HCL 1 % IJ SOLN
INTRAMUSCULAR | Status: AC
  Filled 2019-03-01: qty 20

## 2019-03-01 NOTE — Telephone Encounter (Signed)
Karena Addison, please call [name] to get details.  Oddly, I did not receive any notes about this.  Was the midline removed?  Last I knew he still had it.

## 2019-03-01 NOTE — Telephone Encounter (Signed)
Please advise where to add his appointment - you are at 100% Wednesday.

## 2019-03-01 NOTE — Telephone Encounter (Signed)
RN received call from patient's daughter Ebony Hail with update.  Patient in IR today for drain management because the drain was stopped/clogged (last flushed well last evening). The drain was resized and a culture was done during procedure. IR will be watching patient more closely, next visit is 6/3. Daughter requesting referral to [facility] for possible procedure to remove stones via drain.  Today's labs drawn by home health nurse before patient left for IR work in. Oral antibiotics started 5/9, BMP drawn today.  Weekly labs are still ordered until 6/7. Labs drawn by peripheral stick as patient lost Midline access over the weekend. Heparin/saline flushes, midline maintenance order cancelled at [facility] [place] and [facility] as midline is out.  Notified daughter of Dr Lucianne Lei Dam's request to schedule phone call follow up next week.  Would you prefer to speak with patient before or after his IR visit 6/3? Landis Gandy, RN

## 2019-03-01 NOTE — Procedures (Signed)
  Procedure: GB drain exchange and upsize  28F EBL:   minimal Complications:  none immediate  See full dictation in [facility].  Dillard Cannon MD Main # [phone] Pager  [phone]

## 2019-03-01 NOTE — Telephone Encounter (Signed)
Patient's daughter Gamaliel Charney called this morning.  She stated that patient will be seen at [facility] this morning for gallbladder drain that is clogged.  She also states that he has a midline that needs to be put in as well. She is requesting that Dr. Mariea Clonts call her this morning to discuss these concerns.

## 2019-03-01 NOTE — Telephone Encounter (Signed)
Daughter states everything is fine she will follow-up Dr. Wendie Agreste, they are ok on the midline

## 2019-03-01 NOTE — Telephone Encounter (Signed)
If I can do a phone visit this Wed that might be good so I can followpu the culture. The referral to [facility] is not really in my wheelhouse. I think GI or primary care can do this. He has an outstanding PCP

## 2019-03-01 NOTE — Telephone Encounter (Signed)
Maybe double book at 1130 though I will likely call before IF I can stay ahead w evisits

## 2019-03-02 MED FILL — NORMAL SALINE FLUSH SYRINGE: 0.9 | 45 days supply | Qty: 900 | Fill #0

## 2019-03-02 NOTE — Telephone Encounter (Signed)
Notified patient's wife Webb Silversmith.  Double booked for Wednesday at 11:30.  She asks if he still needs labs weekly, is taking augmentin and bactrim.

## 2019-03-02 NOTE — Telephone Encounter (Signed)
He does not need weekly labs after we had first lab on bactrim and augmentin

## 2019-03-03 ENCOUNTER — Other Ambulatory Visit: Payer: Self-pay

## 2019-03-03 ENCOUNTER — Ambulatory Visit (INDEPENDENT_AMBULATORY_CARE_PROVIDER_SITE_OTHER): Payer: Medicare Other | Admitting: Infectious Disease

## 2019-03-03 ENCOUNTER — Encounter: Payer: Self-pay | Admitting: Infectious Disease

## 2019-03-03 DIAGNOSIS — K8050 Calculus of bile duct without cholangitis or cholecystitis without obstruction: Secondary | ICD-10-CM

## 2019-03-03 DIAGNOSIS — B952 Enterococcus as the cause of diseases classified elsewhere: Secondary | ICD-10-CM

## 2019-03-03 DIAGNOSIS — T827XXD Infection and inflammatory reaction due to other cardiac and vascular devices, implants and grafts, subsequent encounter: Secondary | ICD-10-CM | POA: Diagnosis not present

## 2019-03-03 DIAGNOSIS — F1027 Alcohol dependence with alcohol-induced persisting dementia: Secondary | ICD-10-CM

## 2019-03-03 DIAGNOSIS — R7881 Bacteremia: Secondary | ICD-10-CM

## 2019-03-03 DIAGNOSIS — K8309 Other cholangitis: Secondary | ICD-10-CM

## 2019-03-03 DIAGNOSIS — B9689 Other specified bacterial agents as the cause of diseases classified elsewhere: Secondary | ICD-10-CM

## 2019-03-03 DIAGNOSIS — K8064 Calculus of gallbladder and bile duct with chronic cholecystitis without obstruction: Secondary | ICD-10-CM

## 2019-03-03 NOTE — Progress Notes (Signed)
Virtual Visit via Telephone Note  I connected with William [name] on 23/76/28 at 11:30 AM EDT by telephone and verified that I am speaking with the correct person using two identifiers.  Location: Patient: Home Provider: [facility]   I discussed the limitations, risks, security and privacy concerns of performing an evaluation and management service by telephone and the availability of in person appointments. I also discussed with the patient that there may be a patient responsible charge related to this service. The patient expressed understanding and agreed to proceed.  Note the discussions were entirely with his wife and daughter who are the surrogate decision makers for the patient he was present but the conversation was entirely with surrogate decision makers.   History of Present Illness:  85 y.o. male with recurrent episodes of cholangitis and sepsis at times with bacteremias including in time this fall when he had to have his ICD removed, again remitted admitted with cholangitis and sepsis in the context of an obstructed cholecystostomy tube, and concern for potential obstruction of his biliary tree due to stones.  He was found not to be a surgical candidate which seems quite reasonable to me given his multiple comorbidities including his dementia.  GI were able to remove multiple stones during his admission and cultures from the biliary tree yielded ampicillin sensitive enterococcal species and an Enterobacter species.  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  Patient was met with with palliative care on multiple occasions but his family continued to want "aggressive care".  They have been seeming to want to have a second opinion from a surgeon which seems not very realistic especially in the current environment.  Ultimately arrangements were made for him to continue on the meropenem through May 10.  At the last visit  I had a  lengthy discussion with the patient his wife and 2 daughters about the fact that he was coming to the end of his life.  I again explained to him that absent of surgery he was going to have recurrent episodes of sepsis from the biliary tree given that there is still a large stone in the gallbladder that GI could not remove.  They continued to think that another surgeon might operate.  I told him that I felt that almost all surgeons that could be asked for second opinion would not be enthusiastic about operating on this 85 year old man with multiple comorbidities and known dementia.  I explained also that even anesthesia could potentially impact his further quality of life and potentially worsen his dementia among other things.  I also pointed out that by continuing to have him at home with home health nurses visiting him he was not only being put at risk himself of get acquiring novel coronavirus 12/02/2017 but also out of his wife who is also in her 1s and his 2 daughters.  His wife and daughters accepted this risk.  I also pointed out though that this risk extend outside her family to others who might not be so accepting of this potential risk for infection.  After a lengthy discussion which lasted more than 40 minutes at last visit ultimately we decided to continue his antibiotics through the stop date which is either the 10th of the eighth.  And then I offered to place him on indefinite Bactrim and Augmentin which would cover the most recent pathogens isolated from his biliary tree but not the ESBL that he had  previously.  Since so doing he has remained on Augmentin and Bactrim and has done relatively well.  He has been stable.  He has been seen by interventional radiology which have changed his drain.  They cultured his bile which had multiple calculi in it but it did not appear purulent or not documented as such. Culture was taken which I woud NOT have preferred UNLESS it was purulent. Cx growing  GPC.  His midline also has now come out further and has been removed.  He is apparently going to be referred to [facility] for a procedure under anesthesia but without intubation sedation in which the gallbladder stones might be extracted.  His wife and children were present as was the patient but he did not contribute hardly at all to the conversation. Per the family  His cognition has improved dramttically.  Though I could not myself assess for this.      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  I am not going to react to the culture from the biliary tree that was just taken from interventional radiology unless I hear that there was actually purulence encountered.  There potentially can go to [facility] to have the stones removed.  He goes there and they need further infectious disease assistance and adjustment of antibiotics I am happy to help out.  Goals of care: Continue to feel as if the family do not have realistic goals they feel the patient is better and stabilizing with his drain being followed by interventional radiology and with the possibility of him having the intervention at [facility].     Prevention of novel coronavirus 2019: This will be a significant risk in this patient with multiple admissions to the hospital and high likelihood for recurrent infection and readmission.     I discussed the assessment and treatment plan with the patient. The patient was provided an opportunity to ask questions and all were answered. The patient agreed with the plan and  demonstrated an understanding of the instructions.   The patient was advised to call back or seek an in-person evaluation if the symptoms worsen or if the condition fails to improve as anticipated.  I provided 25 minutes of non-face-to-face time during this encounter.   Alcide Evener, MD

## 2019-03-04 DIAGNOSIS — N3942 Incontinence without sensory awareness: Secondary | ICD-10-CM | POA: Diagnosis not present

## 2019-03-04 DIAGNOSIS — C61 Malignant neoplasm of prostate: Secondary | ICD-10-CM | POA: Diagnosis not present

## 2019-03-05 DIAGNOSIS — F0390 Unspecified dementia without behavioral disturbance: Secondary | ICD-10-CM | POA: Diagnosis not present

## 2019-03-05 DIAGNOSIS — B9689 Other specified bacterial agents as the cause of diseases classified elsewhere: Secondary | ICD-10-CM | POA: Diagnosis not present

## 2019-03-05 DIAGNOSIS — G9341 Metabolic encephalopathy: Secondary | ICD-10-CM | POA: Diagnosis not present

## 2019-03-05 DIAGNOSIS — F05 Delirium due to known physiological condition: Secondary | ICD-10-CM | POA: Diagnosis not present

## 2019-03-05 DIAGNOSIS — A4181 Sepsis due to Enterococcus: Secondary | ICD-10-CM | POA: Diagnosis not present

## 2019-03-05 DIAGNOSIS — K8031 Calculus of bile duct with cholangitis, unspecified, with obstruction: Secondary | ICD-10-CM | POA: Diagnosis not present

## 2019-03-06 LAB — AEROBIC/ANAEROBIC CULTURE W GRAM STAIN (SURGICAL/DEEP WOUND)

## 2019-03-11 ENCOUNTER — Telehealth: Payer: Self-pay | Admitting: Cardiovascular Disease

## 2019-03-11 DIAGNOSIS — Z952 Presence of prosthetic heart valve: Secondary | ICD-10-CM | POA: Diagnosis not present

## 2019-03-11 DIAGNOSIS — I5022 Chronic systolic (congestive) heart failure: Secondary | ICD-10-CM | POA: Diagnosis not present

## 2019-03-11 DIAGNOSIS — G9341 Metabolic encephalopathy: Secondary | ICD-10-CM | POA: Diagnosis not present

## 2019-03-11 DIAGNOSIS — G4730 Sleep apnea, unspecified: Secondary | ICD-10-CM | POA: Diagnosis not present

## 2019-03-11 DIAGNOSIS — E785 Hyperlipidemia, unspecified: Secondary | ICD-10-CM | POA: Diagnosis not present

## 2019-03-11 DIAGNOSIS — Z792 Long term (current) use of antibiotics: Secondary | ICD-10-CM | POA: Diagnosis not present

## 2019-03-11 DIAGNOSIS — K8031 Calculus of bile duct with cholangitis, unspecified, with obstruction: Secondary | ICD-10-CM | POA: Diagnosis not present

## 2019-03-11 DIAGNOSIS — I714 Abdominal aortic aneurysm, without rupture: Secondary | ICD-10-CM | POA: Diagnosis not present

## 2019-03-11 DIAGNOSIS — R918 Other nonspecific abnormal finding of lung field: Secondary | ICD-10-CM | POA: Diagnosis not present

## 2019-03-11 DIAGNOSIS — Z7901 Long term (current) use of anticoagulants: Secondary | ICD-10-CM | POA: Diagnosis not present

## 2019-03-11 DIAGNOSIS — M19032 Primary osteoarthritis, left wrist: Secondary | ICD-10-CM | POA: Diagnosis not present

## 2019-03-11 DIAGNOSIS — Z8546 Personal history of malignant neoplasm of prostate: Secondary | ICD-10-CM | POA: Diagnosis not present

## 2019-03-11 DIAGNOSIS — I051 Rheumatic mitral insufficiency: Secondary | ICD-10-CM | POA: Diagnosis not present

## 2019-03-11 DIAGNOSIS — B9689 Other specified bacterial agents as the cause of diseases classified elsewhere: Secondary | ICD-10-CM | POA: Diagnosis not present

## 2019-03-11 DIAGNOSIS — F0390 Unspecified dementia without behavioral disturbance: Secondary | ICD-10-CM | POA: Diagnosis not present

## 2019-03-11 DIAGNOSIS — I472 Ventricular tachycardia: Secondary | ICD-10-CM | POA: Diagnosis not present

## 2019-03-11 DIAGNOSIS — A4181 Sepsis due to Enterococcus: Secondary | ICD-10-CM | POA: Diagnosis not present

## 2019-03-11 DIAGNOSIS — H9190 Unspecified hearing loss, unspecified ear: Secondary | ICD-10-CM | POA: Diagnosis not present

## 2019-03-11 DIAGNOSIS — M19012 Primary osteoarthritis, left shoulder: Secondary | ICD-10-CM | POA: Diagnosis not present

## 2019-03-11 DIAGNOSIS — J45909 Unspecified asthma, uncomplicated: Secondary | ICD-10-CM | POA: Diagnosis not present

## 2019-03-11 DIAGNOSIS — I429 Cardiomyopathy, unspecified: Secondary | ICD-10-CM | POA: Diagnosis not present

## 2019-03-11 DIAGNOSIS — I2510 Atherosclerotic heart disease of native coronary artery without angina pectoris: Secondary | ICD-10-CM | POA: Diagnosis not present

## 2019-03-11 DIAGNOSIS — R739 Hyperglycemia, unspecified: Secondary | ICD-10-CM | POA: Diagnosis not present

## 2019-03-11 DIAGNOSIS — I0981 Rheumatic heart failure: Secondary | ICD-10-CM | POA: Diagnosis not present

## 2019-03-11 DIAGNOSIS — F05 Delirium due to known physiological condition: Secondary | ICD-10-CM | POA: Diagnosis not present

## 2019-03-11 NOTE — Telephone Encounter (Signed)
° °  Spouse calling to report HR. Wants to know if patient needs to take Carvedilol.  1) What is your heart rate?  100   2) Do you have a log of your heart rate readings (document readings)? 98  3) Do you have any other symptoms? No  BP 100/70 Oxygen 97-99%

## 2019-03-11 NOTE — Telephone Encounter (Signed)
Attempted to return call x3. Phone cuts off after 1 ring

## 2019-03-12 ENCOUNTER — Telehealth: Payer: Self-pay | Admitting: *Deleted

## 2019-03-12 DIAGNOSIS — K8031 Calculus of bile duct with cholangitis, unspecified, with obstruction: Secondary | ICD-10-CM | POA: Diagnosis not present

## 2019-03-12 DIAGNOSIS — A4181 Sepsis due to Enterococcus: Secondary | ICD-10-CM | POA: Diagnosis not present

## 2019-03-12 DIAGNOSIS — B9689 Other specified bacterial agents as the cause of diseases classified elsewhere: Secondary | ICD-10-CM | POA: Diagnosis not present

## 2019-03-12 DIAGNOSIS — G9341 Metabolic encephalopathy: Secondary | ICD-10-CM | POA: Diagnosis not present

## 2019-03-12 DIAGNOSIS — F0390 Unspecified dementia without behavioral disturbance: Secondary | ICD-10-CM | POA: Diagnosis not present

## 2019-03-12 DIAGNOSIS — F05 Delirium due to known physiological condition: Secondary | ICD-10-CM | POA: Diagnosis not present

## 2019-03-12 NOTE — Telephone Encounter (Signed)
Relayed no change to patient's wife. Thanks!

## 2019-03-12 NOTE — Telephone Encounter (Signed)
Patient's wife Lelon Frohlich called to make sure Dr Tommy Medal was aware of culture results from drain exchange on 5/18 and to see if this changes anything. Please advise. Landis Gandy, RN

## 2019-03-12 NOTE — Telephone Encounter (Signed)
It does not because radiologist indicated no purulence which is why they shouldn't have cultured the bile

## 2019-03-15 ENCOUNTER — Encounter: Payer: Self-pay | Admitting: Infectious Disease

## 2019-03-15 DIAGNOSIS — N390 Urinary tract infection, site not specified: Secondary | ICD-10-CM | POA: Diagnosis not present

## 2019-03-15 DIAGNOSIS — R8290 Unspecified abnormal findings in urine: Secondary | ICD-10-CM | POA: Diagnosis not present

## 2019-03-15 DIAGNOSIS — G9341 Metabolic encephalopathy: Secondary | ICD-10-CM | POA: Diagnosis not present

## 2019-03-15 DIAGNOSIS — K8031 Calculus of bile duct with cholangitis, unspecified, with obstruction: Secondary | ICD-10-CM | POA: Diagnosis not present

## 2019-03-15 DIAGNOSIS — F0390 Unspecified dementia without behavioral disturbance: Secondary | ICD-10-CM | POA: Diagnosis not present

## 2019-03-15 DIAGNOSIS — A4181 Sepsis due to Enterococcus: Secondary | ICD-10-CM | POA: Diagnosis not present

## 2019-03-15 DIAGNOSIS — F05 Delirium due to known physiological condition: Secondary | ICD-10-CM | POA: Diagnosis not present

## 2019-03-15 DIAGNOSIS — B9689 Other specified bacterial agents as the cause of diseases classified elsewhere: Secondary | ICD-10-CM | POA: Diagnosis not present

## 2019-03-15 NOTE — Telephone Encounter (Signed)
Yes, considering his deconditioning and anemia, that is actually an appropriate heart rate.  Do not want to start any medicines back.

## 2019-03-15 NOTE — Telephone Encounter (Signed)
Spoke with pt wife, aware of dr croitoru's recommendations. °

## 2019-03-15 NOTE — Telephone Encounter (Signed)
Spoke with pt wife, the patient was taken off carvedilol due to low bp issues and since that time his heart rate is consistently in the upper 90's. His bp this morning was 90/60 and the average is 100-110/70-80. He is otherwise feeling fine. They question if his heart rate in the upper 90's is okay. Will forward to dr croutori to review and advise.

## 2019-03-15 NOTE — Telephone Encounter (Signed)
Thanks Michelle

## 2019-03-16 ENCOUNTER — Telehealth: Payer: Self-pay | Admitting: *Deleted

## 2019-03-16 NOTE — Telephone Encounter (Signed)
Patient daughter called to advise that the patient urine is cloudy and he has just not been feeling well. He was placed on Bactrim DS by urologist and she is concerned that he was given another antibiotic as he already has 2 from [facility] [place]. After review of the chart we discovered he already is taking Bactrim DS and Augmentin. She advised he was given both bactrim yesterday and asked what she should do. Advised her not to give him anymore until I speak with Tommy Medal. Also asked her to have his Urologist to send the results of he recent urine studies to the office and provided the fax number.  After received the fax gave to [facility] and informed of the situation and was given a verbal to have a BMP and Urine culture. Advised the family we need a number to get in touch with the home health company taking care of patient so we can fax order for test [facility] wants. Was told Alvis Lemmings and fax [phone]  Daughter also says he was not given both doses of Bactrim that she misunderstood her mother and they want to wait and see is he is just not feeling well or if there really is a UTI as he has no fever, pain or smelly urine and it is not cloudy when it is in the cup just in the tube. Advised her to give [place] a call once they make the decision about additional labs and will let provider know.

## 2019-03-17 ENCOUNTER — Other Ambulatory Visit: Payer: Self-pay

## 2019-03-17 ENCOUNTER — Encounter: Payer: Self-pay | Admitting: *Deleted

## 2019-03-17 ENCOUNTER — Ambulatory Visit
Admission: RE | Admit: 2019-03-17 | Discharge: 2019-03-17 | Disposition: A | Discharge status: Home / Self Care | Payer: Medicare Other | Source: Ambulatory Visit | Attending: Interventional Radiology | Admitting: Interventional Radiology

## 2019-03-17 DIAGNOSIS — Z4659 Encounter for fitting and adjustment of other gastrointestinal appliance and device: Secondary | ICD-10-CM | POA: Diagnosis not present

## 2019-03-17 DIAGNOSIS — K810 Acute cholecystitis: Secondary | ICD-10-CM

## 2019-03-17 DIAGNOSIS — K8018 Calculus of gallbladder with other cholecystitis without obstruction: Secondary | ICD-10-CM | POA: Diagnosis not present

## 2019-03-17 HISTORY — PX: IR RADIOLOGIST EVAL & MGMT: IMG5224

## 2019-03-17 NOTE — Progress Notes (Signed)
Chief Complaint: Patient was consulted remotely today (TeleHealth) for cholecystitis at the request of [place].    Referring Physician(s): Oretha Caprice, MD  History of Present Illness: William [name] is a 85 y.o. male who initially presented in February 2020 with sepsis which was identified to be secondary to acute calculus cholecystitis.  The patient was evaluated by general surgery and deemed to be a extremely high risk surgical candidate.  Therefore, he was treated by placement of a percutaneous cholecystostomy tube on 11/22/2018.  His first drain exchange was performed on 12/18/2018.  At that time, the drainage catheter was found to be partially occluded and somewhat retracted.  The drain was successfully exchanged for a new 10 French drainage catheter.  The patient returned to interventional radiology 3 weeks later on 01/05/2019 with recurrent partial retraction of the cholecystostomy tube.  The drain was exchanged and upsized to 12 [place] and also secured to the skin with an 0 Prolene suture.  Again, patient did well for another 3 weeks before developing recurrent signs of sepsis requiring admission in April 2020.  On 01/25/2019 patient underwent another biliary tube exchange which demonstrated an occluded biliary drain.  The biliary drain was successfully exchanged and maintained at 60 [place].  Additionally, MRCP imaging at that time revealed choledocholithiasis which was successfully treated by ERCP on 01/29/2019.  Post anesthesia recovery was complicated by delirium.  Following discharge after his most recent episode, his home health nurse has been flushing his drainage catheter twice daily.  It is reported that the drainage catheter flushes easily and without any evidence of obstruction.  Draining bile is clear and not purulent in appearance.  [name] is also doing very well and has recovered from his delirium.  He is at his baseline state of usual health.  Dr. Ardis Hughs  recently started him on Actigall in an effort to thin his bile and facilitate drainage.  Additionally, Dr. Tommy Medal has started him on prophylactic Bactrim and Augmentin.  Following our last visit, his tube became spontaneously clogged several days later.  Luckily, we were able to work him in at [facility] on Monday, 03/01/2019 at which time his drain was successfully exchanged and upsized to 14 [place].  A culture of the bile was sent by my partner which grew out vancomycin-resistant enterococcus.  He has done exceptionally well since his last tube exchange.  Drain output continues to be fairly consistent.  His bile demonstrates significant sediment, possibly due to the Actigall and dissolution of some of his stone material.  He has not had any recurrent fevers, chills or other significant symptoms.  His mental status continues to improve and he is currently at his baseline.  He did have one transient episode of vomiting last weekend but this has resolved.  They have successfully scheduled a consultation with Dr. [name].  This will be a telemedicine visit with his PA.  Past Medical History:  Diagnosis Date  . AAA (abdominal aortic aneurysm) ([place])    7/13 3.8cm  . Arthritis    "joints" (01/11/2014)  . Asthma    "seasonal; some foods"   . Chronic systolic CHF (congestive heart failure) ([place])   . Dementia ([place])   . HLD (hyperlipidemia)   . HOH (hard of hearing)   . Lumbar vertebral fracture ([facility])    L1- 04/11/2014   . Prostate cancer ([place])   . S/P ICD (internal cardiac defibrillator) procedure, 01/11/14 removal of ERI gen and placement of Medtronic Evera XT  VR & NEW Right ventricular lead Medtronic 01/12/2014  . S/P TAVR (transcatheter aortic valve replacement)    Edwards Sapien 3 THV (size 29 mm, model # [id number], serial # [id number])  . Severe aortic stenosis   . Sleep apnea    "lost 60# & don't have it anymore" (01/11/2014)  . Ventricular tachycardia [facility])     Past Surgical History:   Procedure Laterality Date  . CARDIAC CATHETERIZATION  08/20/2004   noncritical CAD,mild global hypokinesis, EF 50%  . CARDIAC DEFIBRILLATOR PLACEMENT  08/23/2004   Medtronic  . CATARACT EXTRACTION W/ INTRAOCULAR LENS IMPLANT Right   . COLECTOMY  1990's  . CRYOABLATION N/A 02/24/2014   Procedure: CRYO ABLATION PROSTATE;  Surgeon: Ailene Rud, MD;  Location: [facility];  Service: Urology;  Laterality: N/A;  . ERCP N/A 01/29/2019   Procedure: ENDOSCOPIC RETROGRADE CHOLANGIOPANCREATOGRAPHY (ERCP);  Surgeon: Milus Banister, MD;  Location: [facility] ENDOSCOPY;  Service: Endoscopy;  Laterality: N/A;  . HERNIA REPAIR     "abdomen; from colon OR"  . ICD LEAD REMOVAL N/A 09/15/2018   Procedure: ICD LEAD REMOVAL EXTRACTION;  Surgeon: Evans Lance, MD;  Location[facility] OR;  Service: Cardiovascular;  Laterality: N/A;  DR. BARTLE TO BACK UP  . IMPLANTABLE CARDIOVERTER DEFIBRILLATOR (ICD) GENERATOR CHANGE N/A 01/11/2014   Procedure: ICD GENERATOR CHANGE;  Surgeon: Sanda Klein, MD;  Location: [place] CATH LAB;  Service: Cardiovascular;  Laterality: N/A;  . IR CATHETER TUBE CHANGE  03/01/2019  . IR EXCHANGE BILIARY DRAIN  12/18/2018  . IR EXCHANGE BILIARY DRAIN  01/05/2019  . IR EXCHANGE BILIARY DRAIN  01/25/2019  . IR PERC CHOLECYSTOSTOMY  11/22/2018  . IR RADIOLOGIST EVAL & MGMT  02/26/2019  . IR RADIOLOGIST EVAL & MGMT  03/17/2019  . IR SINUS/FIST TUBE CHK-NON GI  01/28/2019  . LEAD REVISION N/A 01/11/2014   Procedure: LEAD REVISION;  Surgeon: Sanda Klein, MD;  Location: [place] CATH LAB;  Service: Cardiovascular;  Laterality: N/A;  . NM MYOCAR PERF WALL MOTION  01/29/2012   abnormal c/o infarct/scar,no ischemia present  . PROSTATE BIOPSY N/A 11/22/2013   Procedure: PROSTATE BIOPSY AND ULTRASOUND;  Surgeon: Ailene Rud, MD;  Location: [facility];  Service: Urology;  Laterality: N/A;  . REMOVAL OF STONES  01/29/2019   Procedure: REMOVAL OF STONES;  Surgeon: Milus Banister, MD;  Location: [facility] ENDOSCOPY;  Service:  Endoscopy;;  . RIGHT/LEFT HEART CATH AND CORONARY ANGIOGRAPHY N/A 07/06/2018   Procedure: RIGHT/LEFT HEART CATH AND CORONARY ANGIOGRAPHY;  Surgeon: Troy Sine, MD;  Location: [place] CV LAB;  Service: Cardiovascular;  Laterality: N/A;  . SPHINCTEROTOMY  01/29/2019   Procedure: SPHINCTEROTOMY;  Surgeon: Milus Banister, MD;  Location: [facility] ENDOSCOPY;  Service: Endoscopy;;  . TEE WITHOUT CARDIOVERSION N/A 08/18/2018   Procedure: TRANSESOPHAGEAL ECHOCARDIOGRAM (TEE);  Surgeon: Burnell Blanks, MD;  Location: [place];  Service: Open Heart Surgery;  Laterality: N/A;  . TEE WITHOUT CARDIOVERSION N/A 09/08/2018   Procedure: TRANSESOPHAGEAL ECHOCARDIOGRAM (TEE);  Surgeon: Lelon Perla, MD;  Location: [facility] ENDOSCOPY;  Service: Cardiovascular;  Laterality: N/A;  . TEE WITHOUT CARDIOVERSION N/A 09/15/2018   Procedure: TRANSESOPHAGEAL ECHOCARDIOGRAM (TEE);  Surgeon: Evans Lance, MD;  Location: [facility] OR;  Service: Cardiovascular;  Laterality: N/A;  . TEE WITHOUT CARDIOVERSION N/A 11/26/2018   Procedure: TRANSESOPHAGEAL ECHOCARDIOGRAM (TEE);  Surgeon: Jerline Pain, MD;  Location: [facility] ENDOSCOPY;  Service: Cardiovascular;  Laterality: N/A;  . TRANSCATHETER AORTIC VALVE REPLACEMENT, TRANSFEMORAL  08/18/2018  . TRANSCATHETER AORTIC VALVE  REPLACEMENT, TRANSFEMORAL N/A 08/18/2018   Procedure: TRANSCATHETER AORTIC VALVE REPLACEMENT, TRANSFEMORAL using an Edwards 56mm Aortic Valve;  Surgeon: Burnell Blanks, MD;  Location: [place];  Service: Open Heart Surgery;  Laterality: N/A;    Allergies: Crestor [rosuvastatin]; Lipitor [atorvastatin]; and Shrimp [shellfish allergy]  Medications: Prior to Admission medications   Medication Sig Start Date End Date Taking? Authorizing Provider  acetaminophen (TYLENOL) 500 MG tablet Take 500 mg by mouth every 6 (six) hours as needed (for pain).     [provider]  amiodarone (PACERONE) 200 MG tablet Take 0.5 tablets (100 mg total) by mouth daily.  11/16/18   Croitoru, Mihai, MD  amoxicillin-clavulanate (AUGMENTIN) 875-125 MG tablet Take 1 tablet by mouth 2 (two) times daily.    [provider]  aspirin 81 MG chewable tablet Chew 1 tablet (81 mg total) by mouth daily. 07/08/18   Kroeger, Lorelee Cover., PA-C  cholecalciferol (VITAMIN D) 1000 units tablet Take 2,000 Units by mouth at bedtime.    [provider]  nystatin-triamcinolone (MYCOLOG II) cream Apply 1 application topically See admin instructions. Apply to right and left buttocks twice daily for 7 days - stop date 01/25/19    [provider]  pantoprazole (PROTONIX) 40 MG tablet Take 1 tablet (40 mg total) by mouth at bedtime for 30 days. 02/08/19 03/10/19  Elodia Florence., MD  polyethylene glycol [facility] Floria Raveling) packet Take 17 g by mouth every evening. 01/21/17   Croitoru, Mihai, MD  potassium chloride (K-DUR) 10 MEQ tablet Take 1 tablet (10 mEq total) by mouth daily. 02/25/19   Croitoru, Mihai, MD  sodium chloride flush (NS) 0.9 % SOLN flush drain once daily with 5-10 cc sterile saline Record output daily 02/08/19   Elodia Florence., MD  sulfamethoxazole-trimethoprim (BACTRIM DS) 800-160 MG tablet Take 1 tablet by mouth 2 (two) times daily.    [provider]  triamcinolone (KENALOG) 0.025 % cream Apply 1 application topically 2 (two) times daily.     [provider]  ursodiol (ACTIGALL) 300 MG capsule Take 3 capsules (900 mg total) by mouth 3 (three) times daily for 30 days. 02/22/19 03/24/19  Milus Banister, MD     Family History  Problem Relation Age of Onset  . Heart failure Mother        Died of "old age" at 64  . Pneumonia Father     Social History   Socioeconomic History  . Marital status: Married    Spouse name: Not on file  . Number of children: 2  . Years of education: Not on file  . Highest education level: Not on file  Occupational History  . Occupation: Owned a Scientist, [employer]  Social Needs  .  Financial resource strain: Not on file  . Food insecurity:    Worry: Not on file    Inability: Not on file  . Transportation needs:    Medical: Not on file    Non-medical: Not on file  Tobacco Use  . Smoking status: Never Smoker  . Smokeless tobacco: Never Used  Substance and Sexual Activity  . Alcohol use: Yes    Alcohol/week: 0.0 standard drinks    Comment: 01/11/2014 "drink 1/2 of a beer twice per month   . Drug use: No  . Sexual activity: Not Currently  Lifestyle  . Physical activity:    Days per week: Not on file    Minutes per session: Not on file  . Stress: Not on  file  Relationships  . Social connections:    Talks on phone: Not on file    Gets together: Not on file    Attends religious service: Not on file    Active member of club or organization: Not on file    Attends meetings of clubs or organizations: Not on file    Relationship status: Not on file  Other Topics Concern  . Not on file  Social History Narrative  . Not on file   Review of Systems  Review of Systems: A 12 point ROS discussed and pertinent positives are indicated in the HPI above.  All other systems are negative.  Physical Exam No direct physical exam was performed (except for noted visual exam findings with Video Visits).   Vital Signs: There were no vitals taken for this visit.  Imaging: Ir Catheter Tube Change  Result Date: 03/01/2019 CLINICAL DATA:  History of cholecystitis, status post percutaneous cholecystostomy catheter placement 11/22/2018, most recently exchanged 01/25/2019. Inability to flush. EXAM: PERCUTANEOUS CHOLECYSTOSTOMY TUBE EXCHANGE AND UPSIZING UNDER FLUOROSCOPIC GUIDANCE FLUOROSCOPY TIME:  0.8 minute; 245 uGym2 DAP TECHNIQUE: The procedure, risks (including but not limited to bleeding, infection, organ damage ), benefits, and alternatives were explained to the spouse. Questions regarding the procedure were encouraged and answered. The spouse understands and consents to the  procedure. Existing cholecystostomy catheter and surrounding skin site prepped with Betadine, and draped in usual sterile fashion, and infiltrated locally with 1% lidocaine. Contrast injection demonstrated that catheter pulled back to the peripheral margin of the gallbladder. Multiple filling defects in the gallbladder consistent with known cholelithiasis. Contrast does fill the cystic duct. No extravasation. The catheter was cut and removed over an Amplatz stiff guidewire. New 14 French pigtail drain catheter was advanced and formed centrally within the gallbladder. Multiple small mm calculi were encountered. An aspirate of the bile was sent for Gram stain and culture. The catheter was secured externally with 0 Prolene suture and StatLock and placed to gravity drain bag. Patient tolerated the procedure well. COMPLICATIONS: COMPLICATIONS none IMPRESSION: 1. Technically successful percutaneous cholecystostomy tube revision and upsizing under fluoroscopic guidance. Electronically Signed   By: Lucrezia Europe M.D.   On: 03/01/2019 12:55   Ir Radiologist Eval & Mgmt  Result Date: 03/17/2019 Please refer to notes tab for details about interventional procedure. (Op Note)  Ir Radiologist Eval & Mgmt  Result Date: 02/26/2019 Please refer to notes tab for details about interventional procedure. (Op Note)   Labs:  CBC: Recent Labs    02/05/19 0626 02/06/19 0242 02/07/19 0255 02/08/19 0327  WBC 6.2 5.8 5.6 5.7  HGB 11.0* 11.3* 12.5* 11.9*  HCT 32.4* 32.4* 37.2* 33.8*  PLT 131* 138* 153 130*    COAGS: Recent Labs    08/07/18 1411 08/13/18 0409  11/21/18 0851 11/22/18 1229 01/24/19 2218 02/01/19 0404  INR 1.25 1.09   < > 1.33 1.39 1.2 1.2  APTT 34 37*  --  34  --  31  --    < > = values in this interval not displayed.    BMP: Recent Labs    02/05/19 0626 02/06/19 0242 02/07/19 0255 02/08/19 0327  NA 137 136 135 135  K 3.3* 3.6 4.0 3.3*  CL 104 106 104 101  CO2 22 22 21* 24  GLUCOSE  121* 125* 119* 107*  BUN 18 17 16 16   CALCIUM 8.2* 8.2* 8.4* 8.5*  CREATININE 0.88 0.88 0.95 0.90  GFRNONAA >60 >60 >60 >60  GFRAA >60 >  60 >60 >60    LIVER FUNCTION TESTS: Recent Labs    02/05/19 0626 02/06/19 0242 02/07/19 0255 02/08/19 0327  BILITOT 0.8 0.9 0.9 1.2  AST 23 30 33 26  ALT 19 24 25 24   ALKPHOS 128* 120 121 114  PROT 5.6* 5.8* 6.0* 5.9*  ALBUMIN 2.0* 2.1* 2.2* 2.2*    TUMOR MARKERS: No results for input(s): AFPTM, CEA, CA199, CHROMGRNA in the last 8760 hours.  Assessment and Plan:  Mr. Volante is doing significantly better and has returned to his baseline.  His drainage catheter is continuing to function normally.  His last exchange was on 03/01/2019 at which time it was upsized to 14 [place].  He and his family are scheduled for a telemedicine visit with Dr. Nelly Laurence PA later this month.   1.)  Please schedule for percutaneous cholecystostomy tube exchange.  The family would prefer to have the procedure performed with me.  Please target Monday, 04/05/2019.  I gave his family our phone number to call if he experiences any clogging of the tube between now and then.  If this occurs on the weekend, he would likely require urgent tube exchange given his propensity for sepsis.  2.)  Please double check that Dr. Nelly Laurence office has access to any imaging that they may may require including the MRCP performed on 4/16 and the most recent tube exchange performed on 5/18.    Electronically Signed: Jacqulynn Cadet 03/17/2019, 4:57 PM   I spent a total of 40 Minutes in remote  clinical consultation, greater than 50% of which was counseling/coordinating care for calculous cholecystitis.     Visit type: Audio only (telephone). Audio (no video) only due to lack of camera access. Alternative for in-person consultation at [facility], [address], [place], [place]. This visit type was conducted due to national recommendations for restrictions regarding the  COVID-19 Pandemic (e.g. social distancing).  This format is felt to be most appropriate for this patient at this time.  All issues noted in this document were discussed and addressed.

## 2019-03-17 NOTE — Telephone Encounter (Signed)
Thanks Travis 

## 2019-03-18 DIAGNOSIS — F0390 Unspecified dementia without behavioral disturbance: Secondary | ICD-10-CM | POA: Diagnosis not present

## 2019-03-18 DIAGNOSIS — K8031 Calculus of bile duct with cholangitis, unspecified, with obstruction: Secondary | ICD-10-CM | POA: Diagnosis not present

## 2019-03-18 DIAGNOSIS — G9341 Metabolic encephalopathy: Secondary | ICD-10-CM | POA: Diagnosis not present

## 2019-03-18 DIAGNOSIS — A4181 Sepsis due to Enterococcus: Secondary | ICD-10-CM | POA: Diagnosis not present

## 2019-03-18 DIAGNOSIS — B9689 Other specified bacterial agents as the cause of diseases classified elsewhere: Secondary | ICD-10-CM | POA: Diagnosis not present

## 2019-03-18 DIAGNOSIS — F05 Delirium due to known physiological condition: Secondary | ICD-10-CM | POA: Diagnosis not present

## 2019-03-22 ENCOUNTER — Telehealth: Payer: Self-pay | Admitting: Physician Assistant

## 2019-03-22 ENCOUNTER — Other Ambulatory Visit (HOSPITAL_COMMUNITY): Payer: Self-pay | Admitting: Interventional Radiology

## 2019-03-22 DIAGNOSIS — F0390 Unspecified dementia without behavioral disturbance: Secondary | ICD-10-CM | POA: Diagnosis not present

## 2019-03-22 DIAGNOSIS — G9341 Metabolic encephalopathy: Secondary | ICD-10-CM | POA: Diagnosis not present

## 2019-03-22 DIAGNOSIS — K8031 Calculus of bile duct with cholangitis, unspecified, with obstruction: Secondary | ICD-10-CM | POA: Diagnosis not present

## 2019-03-22 DIAGNOSIS — B9689 Other specified bacterial agents as the cause of diseases classified elsewhere: Secondary | ICD-10-CM | POA: Diagnosis not present

## 2019-03-22 DIAGNOSIS — A4181 Sepsis due to Enterococcus: Secondary | ICD-10-CM | POA: Diagnosis not present

## 2019-03-22 DIAGNOSIS — F05 Delirium due to known physiological condition: Secondary | ICD-10-CM | POA: Diagnosis not present

## 2019-03-22 NOTE — Telephone Encounter (Signed)
Daughter is calling in regards to her dad's CT that is scheduled on 04/02/19. They would like to know if he really needs to have the CT done. And if it shows something then what would be done about it. She wants to talk to someone to see what they are looking for. Can it be put off till later and how much later can it be.

## 2019-03-22 NOTE — Telephone Encounter (Signed)
Agree with plan, thanks

## 2019-03-22 NOTE — Telephone Encounter (Signed)
Spoke to the patient's daughter, Ebony Hail. Informed her the CT was ordered to follow-up on pulmonary nodules. She states she is nervous to bring him out during the pandemic and wishes to reschedule. Due to transportation issues (he goes to appointments via EMS), she requested the CT is scheduled at 1 year TAVR visits.  Rescheduled CT to 10/29. She states she will call if they wish to reschedule at an earlier time.  She was grateful for assistance.

## 2019-03-23 ENCOUNTER — Other Ambulatory Visit (INDEPENDENT_AMBULATORY_CARE_PROVIDER_SITE_OTHER): Payer: Self-pay | Admitting: Specialist

## 2019-03-23 NOTE — Telephone Encounter (Signed)
Generic Miralax refill request

## 2019-03-25 DIAGNOSIS — B9689 Other specified bacterial agents as the cause of diseases classified elsewhere: Secondary | ICD-10-CM | POA: Diagnosis not present

## 2019-03-25 DIAGNOSIS — F05 Delirium due to known physiological condition: Secondary | ICD-10-CM | POA: Diagnosis not present

## 2019-03-25 DIAGNOSIS — G9341 Metabolic encephalopathy: Secondary | ICD-10-CM | POA: Diagnosis not present

## 2019-03-25 DIAGNOSIS — F0390 Unspecified dementia without behavioral disturbance: Secondary | ICD-10-CM | POA: Diagnosis not present

## 2019-03-25 DIAGNOSIS — A4181 Sepsis due to Enterococcus: Secondary | ICD-10-CM | POA: Diagnosis not present

## 2019-03-25 DIAGNOSIS — K8031 Calculus of bile duct with cholangitis, unspecified, with obstruction: Secondary | ICD-10-CM | POA: Diagnosis not present

## 2019-03-26 DIAGNOSIS — K8010 Calculus of gallbladder with chronic cholecystitis without obstruction: Secondary | ICD-10-CM | POA: Diagnosis not present

## 2019-03-29 ENCOUNTER — Encounter: Payer: Self-pay | Admitting: Infectious Disease

## 2019-03-29 ENCOUNTER — Ambulatory Visit (INDEPENDENT_AMBULATORY_CARE_PROVIDER_SITE_OTHER): Payer: Medicare Other | Admitting: Infectious Disease

## 2019-03-29 ENCOUNTER — Other Ambulatory Visit: Payer: Self-pay

## 2019-03-29 DIAGNOSIS — R7881 Bacteremia: Secondary | ICD-10-CM | POA: Diagnosis not present

## 2019-03-29 DIAGNOSIS — K8050 Calculus of bile duct without cholangitis or cholecystitis without obstruction: Secondary | ICD-10-CM | POA: Diagnosis not present

## 2019-03-29 DIAGNOSIS — Z95810 Presence of automatic (implantable) cardiac defibrillator: Secondary | ICD-10-CM

## 2019-03-29 DIAGNOSIS — K8031 Calculus of bile duct with cholangitis, unspecified, with obstruction: Secondary | ICD-10-CM | POA: Diagnosis not present

## 2019-03-29 DIAGNOSIS — K8309 Other cholangitis: Secondary | ICD-10-CM | POA: Diagnosis not present

## 2019-03-29 DIAGNOSIS — A4181 Sepsis due to Enterococcus: Secondary | ICD-10-CM | POA: Diagnosis not present

## 2019-03-29 DIAGNOSIS — F05 Delirium due to known physiological condition: Secondary | ICD-10-CM | POA: Diagnosis not present

## 2019-03-29 DIAGNOSIS — B9689 Other specified bacterial agents as the cause of diseases classified elsewhere: Secondary | ICD-10-CM | POA: Diagnosis not present

## 2019-03-29 DIAGNOSIS — T827XXD Infection and inflammatory reaction due to other cardiac and vascular devices, implants and grafts, subsequent encounter: Secondary | ICD-10-CM

## 2019-03-29 DIAGNOSIS — G9341 Metabolic encephalopathy: Secondary | ICD-10-CM | POA: Diagnosis not present

## 2019-03-29 DIAGNOSIS — F0390 Unspecified dementia without behavioral disturbance: Secondary | ICD-10-CM | POA: Diagnosis not present

## 2019-03-29 MED ORDER — AMOXICILLIN-POT CLAVULANATE 875-125 MG PO TABS: 1.0000 | ORAL_TABLET | Freq: Two times a day (BID) | ORAL | 11 refills | Status: DC

## 2019-03-29 MED ORDER — SULFAMETHOXAZOLE-TRIMETHOPRIM 800-160 MG PO TABS: 1.0000 | ORAL_TABLET | Freq: Two times a day (BID) | ORAL | 11 refills | Status: DC

## 2019-03-29 NOTE — Progress Notes (Signed)
Virtual Visit via Telephone Note  I connected with William [name] on [birth year] at 10:45 AM EDT by telephone and verified that I am speaking with the correct person using two identifiers.  Location: Patient: Home Provider: [facility]   I discussed the limitations, risks, security and privacy concerns of performing an evaluation and management service by telephone and the availability of in person appointments. I also discussed with the patient that there may be a patient responsible charge related to this service. The patient expressed understanding and agreed to proceed.  Note the discussions were entirely with his wife and daughter who are the surrogate decision makers for the patient he was present but the conversation was entirely with surrogate decision makers.   History of Present Illness:  85 y.o. male with recurrent episodes of cholangitis and sepsis at times with bacteremias including in time this fall when he had to have his ICD removed, again remitted admitted with cholangitis and sepsis in the context of an obstructed cholecystostomy tube, and concern for potential obstruction of his biliary tree due to stones.  He was found not to be a surgical candidate which seems quite reasonable to me given his multiple comorbidities including his dementia.  GI were able to remove multiple stones during his admission and cultures from the biliary tree yielded ampicillin sensitive enterococcal species and an Enterobacter species.  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  Patient was met with with palliative care on multiple occasions but his family continued to want "aggressive care".  They have been seeming to want to have a second opinion from a surgeon which seems not very realistic especially in the current environment.  Ultimately arrangements were made for him to continue on the meropenem through May 10.  At the last visit  I had a  lengthy discussion with the patient his wife and 2 daughters about the fact that he was coming to the end of his life.  I again explained to him that absent of surgery he was going to have recurrent episodes of sepsis from the biliary tree given that there is still a large stone in the gallbladder that GI could not remove.  They continued to think that another surgeon might operate.  I told him that I felt that almost all surgeons that could be asked for second opinion would not be enthusiastic about operating on this 86 year old man with multiple comorbidities and known dementia.  I explained also that even anesthesia could potentially impact his further quality of life and potentially worsen his dementia among other things.  I also pointed out that by continuing to have him at home with home health nurses visiting him he was not only being put at risk himself of get acquiring novel coronavirus 12/02/2017 but also out of his wife who is also in her 93s and his 2 daughters.  His wife and daughters accepted this risk.  I also pointed out though that this risk extend outside her family to others who might not be so accepting of this potential risk for infection.  After a lengthy discussion which lasted more than 40 minutes at last visit ultimately we decided to continue his antibiotics through the stop date which is either the 10th of the eighth.  And then I offered to place him on indefinite Bactrim and Augmentin which would cover the most recent pathogens isolated from his biliary tree but not the ESBL that he had  previously.  Since so doing he has remained on Augmentin and Bactrim and has done relatively well.  He has been stable.  He has been seen by interventional radiology which have changed his drain.  They cultured his bile which had multiple calculi in it but it did not appear purulent or not documented as such. Culture was taken which I woud NOT have preferred UNLESS it was purulent. 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 He had been rx bactrim by provider not knowing he was already on it but he neer took extra doses.      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 He will followup with [facility], drain management by IR here vs [facility]  I would only trial him off abx once we feel confident sufficient # of stones, bulk has been removed  Goals of care: I still think "big picture" and goals need to be assessed.    Prevention of novel coronavirus 2019: This will be a significant risk in this patient with multiple admissions to the hospital and high likelihood for recurrent infection and readmission.     I discussed the assessment and treatment plan with the patient. The patient was provided an opportunity to ask questions and all were answered. The patient agreed with the plan and demonstrated an understanding of the instructions.   The patient was advised to call back or seek an in-person evaluation if the symptoms worsen or if the condition fails to improve as anticipated.  I provided 20 minutes of non-face-to-face time during this encounter.   Alcide Evener, MD

## 2019-03-30 NOTE — Progress Notes (Signed)
Thank you :)

## 2019-04-02 ENCOUNTER — Inpatient Hospital Stay: Admission: RE | Admit: 2019-04-02 | Payer: Medicare Other | Source: Ambulatory Visit

## 2019-04-02 DIAGNOSIS — G9341 Metabolic encephalopathy: Secondary | ICD-10-CM | POA: Diagnosis not present

## 2019-04-02 DIAGNOSIS — A4181 Sepsis due to Enterococcus: Secondary | ICD-10-CM | POA: Diagnosis not present

## 2019-04-02 DIAGNOSIS — F0390 Unspecified dementia without behavioral disturbance: Secondary | ICD-10-CM | POA: Diagnosis not present

## 2019-04-02 DIAGNOSIS — K8031 Calculus of bile duct with cholangitis, unspecified, with obstruction: Secondary | ICD-10-CM | POA: Diagnosis not present

## 2019-04-02 DIAGNOSIS — F05 Delirium due to known physiological condition: Secondary | ICD-10-CM | POA: Diagnosis not present

## 2019-04-02 DIAGNOSIS — B9689 Other specified bacterial agents as the cause of diseases classified elsewhere: Secondary | ICD-10-CM | POA: Diagnosis not present

## 2019-04-05 ENCOUNTER — Other Ambulatory Visit: Payer: Self-pay

## 2019-04-05 ENCOUNTER — Telehealth (HOSPITAL_COMMUNITY): Payer: Self-pay

## 2019-04-05 ENCOUNTER — Ambulatory Visit (HOSPITAL_COMMUNITY)
Admission: RE | Admit: 2019-04-05 | Discharge: 2019-04-05 | Disposition: A | Discharge status: Home / Self Care | Payer: Medicare Other | Source: Ambulatory Visit | Attending: Interventional Radiology | Admitting: Interventional Radiology

## 2019-04-05 ENCOUNTER — Other Ambulatory Visit (HOSPITAL_COMMUNITY): Payer: Self-pay | Admitting: Interventional Radiology

## 2019-04-05 ENCOUNTER — Encounter (HOSPITAL_COMMUNITY): Payer: Self-pay | Admitting: Interventional Radiology

## 2019-04-05 DIAGNOSIS — K8050 Calculus of bile duct without cholangitis or cholecystitis without obstruction: Secondary | ICD-10-CM | POA: Insufficient documentation

## 2019-04-05 DIAGNOSIS — Z4803 Encounter for change or removal of drains: Secondary | ICD-10-CM | POA: Insufficient documentation

## 2019-04-05 HISTORY — PX: IR CATHETER TUBE CHANGE: IMG717

## 2019-04-05 MED ORDER — IOHEXOL 300 MG/ML  SOLN
50.0000 mL | Freq: Once | INTRAMUSCULAR | Status: AC | PRN
  Administered 2019-04-05: 15 mL

## 2019-04-05 MED ORDER — LIDOCAINE HCL 1 % IJ SOLN
INTRAMUSCULAR | Status: AC
  Filled 2019-04-05: qty 20

## 2019-04-09 DIAGNOSIS — F05 Delirium due to known physiological condition: Secondary | ICD-10-CM | POA: Diagnosis not present

## 2019-04-09 DIAGNOSIS — F0390 Unspecified dementia without behavioral disturbance: Secondary | ICD-10-CM

## 2019-04-09 DIAGNOSIS — G9341 Metabolic encephalopathy: Secondary | ICD-10-CM | POA: Diagnosis not present

## 2019-04-09 DIAGNOSIS — I472 Ventricular tachycardia: Secondary | ICD-10-CM | POA: Diagnosis not present

## 2019-04-09 DIAGNOSIS — A4181 Sepsis due to Enterococcus: Secondary | ICD-10-CM | POA: Diagnosis not present

## 2019-04-09 DIAGNOSIS — K819 Cholecystitis, unspecified: Secondary | ICD-10-CM | POA: Diagnosis not present

## 2019-04-09 DIAGNOSIS — K8031 Calculus of bile duct with cholangitis, unspecified, with obstruction: Secondary | ICD-10-CM | POA: Diagnosis not present

## 2019-04-09 DIAGNOSIS — I5022 Chronic systolic (congestive) heart failure: Secondary | ICD-10-CM | POA: Diagnosis not present

## 2019-04-09 DIAGNOSIS — B9689 Other specified bacterial agents as the cause of diseases classified elsewhere: Secondary | ICD-10-CM | POA: Diagnosis not present

## 2019-04-10 DIAGNOSIS — I472 Ventricular tachycardia: Secondary | ICD-10-CM | POA: Diagnosis not present

## 2019-04-10 DIAGNOSIS — Z952 Presence of prosthetic heart valve: Secondary | ICD-10-CM | POA: Diagnosis not present

## 2019-04-10 DIAGNOSIS — H9190 Unspecified hearing loss, unspecified ear: Secondary | ICD-10-CM | POA: Diagnosis not present

## 2019-04-10 DIAGNOSIS — J45909 Unspecified asthma, uncomplicated: Secondary | ICD-10-CM | POA: Diagnosis not present

## 2019-04-10 DIAGNOSIS — I051 Rheumatic mitral insufficiency: Secondary | ICD-10-CM | POA: Diagnosis not present

## 2019-04-10 DIAGNOSIS — G9341 Metabolic encephalopathy: Secondary | ICD-10-CM

## 2019-04-10 DIAGNOSIS — F0390 Unspecified dementia without behavioral disturbance: Secondary | ICD-10-CM | POA: Diagnosis not present

## 2019-04-10 DIAGNOSIS — I714 Abdominal aortic aneurysm, without rupture: Secondary | ICD-10-CM | POA: Diagnosis not present

## 2019-04-10 DIAGNOSIS — E785 Hyperlipidemia, unspecified: Secondary | ICD-10-CM | POA: Diagnosis not present

## 2019-04-10 DIAGNOSIS — R739 Hyperglycemia, unspecified: Secondary | ICD-10-CM

## 2019-04-10 DIAGNOSIS — F05 Delirium due to known physiological condition: Secondary | ICD-10-CM

## 2019-04-10 DIAGNOSIS — I429 Cardiomyopathy, unspecified: Secondary | ICD-10-CM | POA: Diagnosis not present

## 2019-04-10 DIAGNOSIS — Z9181 History of falling: Secondary | ICD-10-CM | POA: Diagnosis not present

## 2019-04-10 DIAGNOSIS — Z7982 Long term (current) use of aspirin: Secondary | ICD-10-CM | POA: Diagnosis not present

## 2019-04-10 DIAGNOSIS — K8031 Calculus of bile duct with cholangitis, unspecified, with obstruction: Secondary | ICD-10-CM | POA: Diagnosis not present

## 2019-04-10 DIAGNOSIS — R918 Other nonspecific abnormal finding of lung field: Secondary | ICD-10-CM | POA: Diagnosis not present

## 2019-04-10 DIAGNOSIS — Z8546 Personal history of malignant neoplasm of prostate: Secondary | ICD-10-CM | POA: Diagnosis not present

## 2019-04-10 DIAGNOSIS — M19012 Primary osteoarthritis, left shoulder: Secondary | ICD-10-CM | POA: Diagnosis not present

## 2019-04-10 DIAGNOSIS — B9689 Other specified bacterial agents as the cause of diseases classified elsewhere: Secondary | ICD-10-CM | POA: Diagnosis not present

## 2019-04-10 DIAGNOSIS — I2510 Atherosclerotic heart disease of native coronary artery without angina pectoris: Secondary | ICD-10-CM | POA: Diagnosis not present

## 2019-04-10 DIAGNOSIS — A4181 Sepsis due to Enterococcus: Secondary | ICD-10-CM | POA: Diagnosis not present

## 2019-04-10 DIAGNOSIS — G4730 Sleep apnea, unspecified: Secondary | ICD-10-CM | POA: Diagnosis not present

## 2019-04-10 DIAGNOSIS — I5022 Chronic systolic (congestive) heart failure: Secondary | ICD-10-CM | POA: Diagnosis not present

## 2019-04-10 DIAGNOSIS — I0981 Rheumatic heart failure: Secondary | ICD-10-CM

## 2019-04-10 DIAGNOSIS — M19032 Primary osteoarthritis, left wrist: Secondary | ICD-10-CM | POA: Diagnosis not present

## 2019-04-12 DIAGNOSIS — F0390 Unspecified dementia without behavioral disturbance: Secondary | ICD-10-CM | POA: Diagnosis not present

## 2019-04-12 DIAGNOSIS — G9341 Metabolic encephalopathy: Secondary | ICD-10-CM | POA: Diagnosis not present

## 2019-04-12 DIAGNOSIS — B9689 Other specified bacterial agents as the cause of diseases classified elsewhere: Secondary | ICD-10-CM | POA: Diagnosis not present

## 2019-04-12 DIAGNOSIS — K8031 Calculus of bile duct with cholangitis, unspecified, with obstruction: Secondary | ICD-10-CM | POA: Diagnosis not present

## 2019-04-12 DIAGNOSIS — F05 Delirium due to known physiological condition: Secondary | ICD-10-CM | POA: Diagnosis not present

## 2019-04-12 DIAGNOSIS — A4181 Sepsis due to Enterococcus: Secondary | ICD-10-CM | POA: Diagnosis not present

## 2019-04-12 MED FILL — NORMAL SALINE FLUSH SYRINGE: 0.9 | 90 days supply | Qty: 900 | Fill #0

## 2019-04-16 DIAGNOSIS — F05 Delirium due to known physiological condition: Secondary | ICD-10-CM | POA: Diagnosis not present

## 2019-04-16 DIAGNOSIS — A4181 Sepsis due to Enterococcus: Secondary | ICD-10-CM | POA: Diagnosis not present

## 2019-04-16 DIAGNOSIS — F0390 Unspecified dementia without behavioral disturbance: Secondary | ICD-10-CM | POA: Diagnosis not present

## 2019-04-16 DIAGNOSIS — G9341 Metabolic encephalopathy: Secondary | ICD-10-CM | POA: Diagnosis not present

## 2019-04-16 DIAGNOSIS — B9689 Other specified bacterial agents as the cause of diseases classified elsewhere: Secondary | ICD-10-CM | POA: Diagnosis not present

## 2019-04-16 DIAGNOSIS — K8031 Calculus of bile duct with cholangitis, unspecified, with obstruction: Secondary | ICD-10-CM | POA: Diagnosis not present

## 2019-04-19 ENCOUNTER — Other Ambulatory Visit: Payer: Self-pay | Admitting: Gastroenterology

## 2019-04-19 DIAGNOSIS — I5022 Chronic systolic (congestive) heart failure: Secondary | ICD-10-CM | POA: Diagnosis not present

## 2019-04-19 DIAGNOSIS — F05 Delirium due to known physiological condition: Secondary | ICD-10-CM | POA: Diagnosis not present

## 2019-04-19 DIAGNOSIS — A4181 Sepsis due to Enterococcus: Secondary | ICD-10-CM | POA: Diagnosis not present

## 2019-04-19 DIAGNOSIS — G9341 Metabolic encephalopathy: Secondary | ICD-10-CM | POA: Diagnosis not present

## 2019-04-19 DIAGNOSIS — B9689 Other specified bacterial agents as the cause of diseases classified elsewhere: Secondary | ICD-10-CM | POA: Diagnosis not present

## 2019-04-19 DIAGNOSIS — F0390 Unspecified dementia without behavioral disturbance: Secondary | ICD-10-CM | POA: Diagnosis not present

## 2019-04-19 DIAGNOSIS — K8031 Calculus of bile duct with cholangitis, unspecified, with obstruction: Secondary | ICD-10-CM | POA: Diagnosis not present

## 2019-04-19 DIAGNOSIS — I2510 Atherosclerotic heart disease of native coronary artery without angina pectoris: Secondary | ICD-10-CM | POA: Diagnosis not present

## 2019-04-19 DIAGNOSIS — K819 Cholecystitis, unspecified: Secondary | ICD-10-CM | POA: Diagnosis not present

## 2019-04-22 ENCOUNTER — Other Ambulatory Visit: Payer: Self-pay | Admitting: Gastroenterology

## 2019-04-22 DIAGNOSIS — A4181 Sepsis due to Enterococcus: Secondary | ICD-10-CM | POA: Diagnosis not present

## 2019-04-22 DIAGNOSIS — G9341 Metabolic encephalopathy: Secondary | ICD-10-CM | POA: Diagnosis not present

## 2019-04-22 DIAGNOSIS — K8031 Calculus of bile duct with cholangitis, unspecified, with obstruction: Secondary | ICD-10-CM | POA: Diagnosis not present

## 2019-04-22 DIAGNOSIS — F0390 Unspecified dementia without behavioral disturbance: Secondary | ICD-10-CM | POA: Diagnosis not present

## 2019-04-22 DIAGNOSIS — F05 Delirium due to known physiological condition: Secondary | ICD-10-CM | POA: Diagnosis not present

## 2019-04-22 DIAGNOSIS — B9689 Other specified bacterial agents as the cause of diseases classified elsewhere: Secondary | ICD-10-CM | POA: Diagnosis not present

## 2019-04-23 DIAGNOSIS — K8020 Calculus of gallbladder without cholecystitis without obstruction: Secondary | ICD-10-CM | POA: Diagnosis not present

## 2019-04-23 DIAGNOSIS — K8070 Calculus of gallbladder and bile duct without cholecystitis without obstruction: Secondary | ICD-10-CM | POA: Diagnosis not present

## 2019-04-23 DIAGNOSIS — T85518A Breakdown (mechanical) of other gastrointestinal prosthetic devices, implants and grafts, initial encounter: Secondary | ICD-10-CM | POA: Diagnosis not present

## 2019-04-26 DIAGNOSIS — K8031 Calculus of bile duct with cholangitis, unspecified, with obstruction: Secondary | ICD-10-CM | POA: Diagnosis not present

## 2019-04-26 DIAGNOSIS — G9341 Metabolic encephalopathy: Secondary | ICD-10-CM | POA: Diagnosis not present

## 2019-04-26 DIAGNOSIS — A4181 Sepsis due to Enterococcus: Secondary | ICD-10-CM | POA: Diagnosis not present

## 2019-04-26 DIAGNOSIS — F05 Delirium due to known physiological condition: Secondary | ICD-10-CM | POA: Diagnosis not present

## 2019-04-26 DIAGNOSIS — F0390 Unspecified dementia without behavioral disturbance: Secondary | ICD-10-CM | POA: Diagnosis not present

## 2019-04-26 DIAGNOSIS — B9689 Other specified bacterial agents as the cause of diseases classified elsewhere: Secondary | ICD-10-CM | POA: Diagnosis not present

## 2019-04-30 DIAGNOSIS — F0390 Unspecified dementia without behavioral disturbance: Secondary | ICD-10-CM | POA: Diagnosis not present

## 2019-04-30 DIAGNOSIS — B9689 Other specified bacterial agents as the cause of diseases classified elsewhere: Secondary | ICD-10-CM | POA: Diagnosis not present

## 2019-04-30 DIAGNOSIS — F05 Delirium due to known physiological condition: Secondary | ICD-10-CM | POA: Diagnosis not present

## 2019-04-30 DIAGNOSIS — A4181 Sepsis due to Enterococcus: Secondary | ICD-10-CM | POA: Diagnosis not present

## 2019-04-30 DIAGNOSIS — K8031 Calculus of bile duct with cholangitis, unspecified, with obstruction: Secondary | ICD-10-CM | POA: Diagnosis not present

## 2019-04-30 DIAGNOSIS — G9341 Metabolic encephalopathy: Secondary | ICD-10-CM | POA: Diagnosis not present

## 2019-05-03 DIAGNOSIS — F05 Delirium due to known physiological condition: Secondary | ICD-10-CM | POA: Diagnosis not present

## 2019-05-03 DIAGNOSIS — G9341 Metabolic encephalopathy: Secondary | ICD-10-CM | POA: Diagnosis not present

## 2019-05-03 DIAGNOSIS — K8031 Calculus of bile duct with cholangitis, unspecified, with obstruction: Secondary | ICD-10-CM | POA: Diagnosis not present

## 2019-05-03 DIAGNOSIS — B9689 Other specified bacterial agents as the cause of diseases classified elsewhere: Secondary | ICD-10-CM | POA: Diagnosis not present

## 2019-05-03 DIAGNOSIS — F0390 Unspecified dementia without behavioral disturbance: Secondary | ICD-10-CM | POA: Diagnosis not present

## 2019-05-03 DIAGNOSIS — A4181 Sepsis due to Enterococcus: Secondary | ICD-10-CM | POA: Diagnosis not present

## 2019-05-04 ENCOUNTER — Other Ambulatory Visit: Payer: Self-pay

## 2019-05-04 ENCOUNTER — Ambulatory Visit (INDEPENDENT_AMBULATORY_CARE_PROVIDER_SITE_OTHER): Payer: Medicare Other | Admitting: Infectious Disease

## 2019-05-04 ENCOUNTER — Encounter: Payer: Self-pay | Admitting: Infectious Disease

## 2019-05-04 DIAGNOSIS — B9689 Other specified bacterial agents as the cause of diseases classified elsewhere: Secondary | ICD-10-CM

## 2019-05-04 DIAGNOSIS — T827XXD Infection and inflammatory reaction due to other cardiac and vascular devices, implants and grafts, subsequent encounter: Secondary | ICD-10-CM | POA: Diagnosis not present

## 2019-05-04 DIAGNOSIS — B952 Enterococcus as the cause of diseases classified elsewhere: Secondary | ICD-10-CM | POA: Diagnosis not present

## 2019-05-04 DIAGNOSIS — R7881 Bacteremia: Secondary | ICD-10-CM

## 2019-05-04 NOTE — Progress Notes (Signed)
Virtual Visit via Telephone Note  I connected with William [name] on 05/04/19 at  9:45 AM EDT by telephone and verified that I am speaking with the correct person using two identifiers.  Location: Patient: Home Provider: My home   I discussed the limitations, risks, security and privacy concerns of performing an evaluation and management service by telephone and the availability of in person appointments. I also discussed with the patient that there may be a patient responsible charge related to this service. The patient expressed understanding and agreed to proceed.  Note the discussions were entirely with his wife and daughter who are the surrogate decision makers for the patient he was present but the conversation was entirely with surrogate decision makers.   History of Present Illness:  85 y.o. male with recurrent episodes of cholangitis and sepsis at times with bacteremias including in time this fall when he had to have his ICD removed, again remitted admitted with cholangitis and sepsis in the context of an obstructed cholecystostomy tube, and concern for potential obstruction of his biliary tree due to stones.  He was found not to be a surgical candidate which seems quite reasonable to me given his multiple comorbidities including his dementia.  GI were able to remove multiple stones during his admission and cultures from the biliary tree yielded ampicillin sensitive enterococcal species and an Enterobacter species.  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  Patient was met with with palliative care on multiple occasions but his family continued to want "aggressive care".  They have been seeming to want to have a second opinion from a surgeon which seems not very realistic especially in the current environment.  Ultimately arrangements were made for him to continue on the meropenem through May 10.  At the last visit  I had  a lengthy discussion with the patient his wife and 2 daughters about the fact that he was coming to the end of his life.  I again explained to him that absent of surgery he was going to have recurrent episodes of sepsis from the biliary tree given that there is still a large stone in the gallbladder that GI could not remove.  They continued to think that another surgeon might operate.  I told him that I felt that almost all surgeons that could be asked for second opinion would not be enthusiastic about operating on this 85-year-old man with multiple comorbidities and known dementia.  I explained also that even anesthesia could potentially impact his further quality of life and potentially worsen his dementia among other things.  I also pointed out that by continuing to have him at home with home health nurses visiting him he was not only being put at risk himself of get acquiring novel coronavirus 12/02/2017 but also out of his wife who is also in her 80s and his 2 daughters.  His wife and daughters accepted this risk.  I also pointed out though that this risk extend outside her family to others who might not be so accepting of this potential risk for infection.  After a lengthy discussion which lasted more than 40 minutes at last visit ultimately we decided to continue his antibiotics through the stop date which is either the 10th of the eighth.  And then I offered to place him on indefinite Bactrim and Augmentin which would cover the most recent pathogens isolated from his biliary tree but not the ESBL that   he had previously.  Since so doing he has remained on Augmentin and Bactrim and has done relatively well.  He has been stable.  He has been seen by interventional radiology which have changed his drain.  They cultured his bile which had multiple calculi in it but it did not appear purulent or not documented as such. Culture was taken which I woud NOT have preferred UNLESS it was purulent. 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  He is scheduled for further interventions there once this week and then likely in another 2 weeks.  He is tolerating his antibiotics without problems.  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  There may be still residual stones in the cystic duct.  In the interim we will continue him on antibiotics do have further clarity about what all has happened at [facility].  I am scheduling him to see me through a virtual visit in August   Follow Up Instructions:  Recurrent cholangitis and sepsis:   For now we will continue with current antimicrobials. He will followup with [facility], drain management by IR here vs [facility]  I would only trial him off abx once we feel confident sufficient # of stones, bulk has been removed  Goals of care: I still think "big picture" and goals need to be assessed.    Prevention of novel coronavirus 2019: This will be a significant risk in this patient with multiple admissions to the hospital and high likelihood for recurrent infection and  readmission.     I discussed the assessment and treatment plan with the patient. The patient was provided an opportunity to ask questions and all were answered. The patient agreed with the plan and demonstrated an understanding of the instructions.   The patient was advised to call back or seek an in-person evaluation if the symptoms worsen or if the condition fails to improve as anticipated.  I provided 21  minutes of non-face-to-face time during this encounter.    [place], MD   

## 2019-05-05 DIAGNOSIS — K8031 Calculus of bile duct with cholangitis, unspecified, with obstruction: Secondary | ICD-10-CM | POA: Diagnosis not present

## 2019-05-05 DIAGNOSIS — F05 Delirium due to known physiological condition: Secondary | ICD-10-CM | POA: Diagnosis not present

## 2019-05-05 DIAGNOSIS — F0390 Unspecified dementia without behavioral disturbance: Secondary | ICD-10-CM | POA: Diagnosis not present

## 2019-05-05 DIAGNOSIS — G9341 Metabolic encephalopathy: Secondary | ICD-10-CM | POA: Diagnosis not present

## 2019-05-05 DIAGNOSIS — B9689 Other specified bacterial agents as the cause of diseases classified elsewhere: Secondary | ICD-10-CM | POA: Diagnosis not present

## 2019-05-05 DIAGNOSIS — A4181 Sepsis due to Enterococcus: Secondary | ICD-10-CM | POA: Diagnosis not present

## 2019-05-06 DIAGNOSIS — K8050 Calculus of bile duct without cholangitis or cholecystitis without obstruction: Secondary | ICD-10-CM | POA: Diagnosis not present

## 2019-05-06 DIAGNOSIS — K8020 Calculus of gallbladder without cholecystitis without obstruction: Secondary | ICD-10-CM | POA: Diagnosis not present

## 2019-05-06 DIAGNOSIS — Z4803 Encounter for change or removal of drains: Secondary | ICD-10-CM | POA: Diagnosis not present

## 2019-05-10 DIAGNOSIS — F0390 Unspecified dementia without behavioral disturbance: Secondary | ICD-10-CM | POA: Diagnosis not present

## 2019-05-10 DIAGNOSIS — M19012 Primary osteoarthritis, left shoulder: Secondary | ICD-10-CM | POA: Diagnosis not present

## 2019-05-10 DIAGNOSIS — B9689 Other specified bacterial agents as the cause of diseases classified elsewhere: Secondary | ICD-10-CM | POA: Diagnosis not present

## 2019-05-10 DIAGNOSIS — E785 Hyperlipidemia, unspecified: Secondary | ICD-10-CM | POA: Diagnosis not present

## 2019-05-10 DIAGNOSIS — I2510 Atherosclerotic heart disease of native coronary artery without angina pectoris: Secondary | ICD-10-CM | POA: Diagnosis not present

## 2019-05-10 DIAGNOSIS — H9190 Unspecified hearing loss, unspecified ear: Secondary | ICD-10-CM | POA: Diagnosis not present

## 2019-05-10 DIAGNOSIS — I429 Cardiomyopathy, unspecified: Secondary | ICD-10-CM | POA: Diagnosis not present

## 2019-05-10 DIAGNOSIS — R739 Hyperglycemia, unspecified: Secondary | ICD-10-CM | POA: Diagnosis not present

## 2019-05-10 DIAGNOSIS — A4181 Sepsis due to Enterococcus: Secondary | ICD-10-CM | POA: Diagnosis not present

## 2019-05-10 DIAGNOSIS — K8031 Calculus of bile duct with cholangitis, unspecified, with obstruction: Secondary | ICD-10-CM | POA: Diagnosis not present

## 2019-05-10 DIAGNOSIS — Z7982 Long term (current) use of aspirin: Secondary | ICD-10-CM | POA: Diagnosis not present

## 2019-05-10 DIAGNOSIS — R918 Other nonspecific abnormal finding of lung field: Secondary | ICD-10-CM | POA: Diagnosis not present

## 2019-05-10 DIAGNOSIS — G9341 Metabolic encephalopathy: Secondary | ICD-10-CM | POA: Diagnosis not present

## 2019-05-10 DIAGNOSIS — G4730 Sleep apnea, unspecified: Secondary | ICD-10-CM | POA: Diagnosis not present

## 2019-05-10 DIAGNOSIS — F05 Delirium due to known physiological condition: Secondary | ICD-10-CM | POA: Diagnosis not present

## 2019-05-10 DIAGNOSIS — I051 Rheumatic mitral insufficiency: Secondary | ICD-10-CM | POA: Diagnosis not present

## 2019-05-10 DIAGNOSIS — Z8546 Personal history of malignant neoplasm of prostate: Secondary | ICD-10-CM | POA: Diagnosis not present

## 2019-05-10 DIAGNOSIS — I714 Abdominal aortic aneurysm, without rupture: Secondary | ICD-10-CM | POA: Diagnosis not present

## 2019-05-10 DIAGNOSIS — I5022 Chronic systolic (congestive) heart failure: Secondary | ICD-10-CM | POA: Diagnosis not present

## 2019-05-10 DIAGNOSIS — M19032 Primary osteoarthritis, left wrist: Secondary | ICD-10-CM | POA: Diagnosis not present

## 2019-05-10 DIAGNOSIS — Z952 Presence of prosthetic heart valve: Secondary | ICD-10-CM | POA: Diagnosis not present

## 2019-05-10 DIAGNOSIS — I472 Ventricular tachycardia: Secondary | ICD-10-CM | POA: Diagnosis not present

## 2019-05-10 DIAGNOSIS — J45909 Unspecified asthma, uncomplicated: Secondary | ICD-10-CM | POA: Diagnosis not present

## 2019-05-10 DIAGNOSIS — Z9181 History of falling: Secondary | ICD-10-CM | POA: Diagnosis not present

## 2019-05-10 DIAGNOSIS — I0981 Rheumatic heart failure: Secondary | ICD-10-CM | POA: Diagnosis not present

## 2019-05-14 DIAGNOSIS — K8031 Calculus of bile duct with cholangitis, unspecified, with obstruction: Secondary | ICD-10-CM | POA: Diagnosis not present

## 2019-05-14 DIAGNOSIS — B9689 Other specified bacterial agents as the cause of diseases classified elsewhere: Secondary | ICD-10-CM | POA: Diagnosis not present

## 2019-05-14 DIAGNOSIS — G9341 Metabolic encephalopathy: Secondary | ICD-10-CM | POA: Diagnosis not present

## 2019-05-14 DIAGNOSIS — F0390 Unspecified dementia without behavioral disturbance: Secondary | ICD-10-CM | POA: Diagnosis not present

## 2019-05-14 DIAGNOSIS — F05 Delirium due to known physiological condition: Secondary | ICD-10-CM | POA: Diagnosis not present

## 2019-05-14 DIAGNOSIS — A4181 Sepsis due to Enterococcus: Secondary | ICD-10-CM | POA: Diagnosis not present

## 2019-05-17 DIAGNOSIS — K8031 Calculus of bile duct with cholangitis, unspecified, with obstruction: Secondary | ICD-10-CM | POA: Diagnosis not present

## 2019-05-17 DIAGNOSIS — F0390 Unspecified dementia without behavioral disturbance: Secondary | ICD-10-CM | POA: Diagnosis not present

## 2019-05-17 DIAGNOSIS — A4181 Sepsis due to Enterococcus: Secondary | ICD-10-CM | POA: Diagnosis not present

## 2019-05-17 DIAGNOSIS — G9341 Metabolic encephalopathy: Secondary | ICD-10-CM | POA: Diagnosis not present

## 2019-05-17 DIAGNOSIS — F05 Delirium due to known physiological condition: Secondary | ICD-10-CM | POA: Diagnosis not present

## 2019-05-17 DIAGNOSIS — B9689 Other specified bacterial agents as the cause of diseases classified elsewhere: Secondary | ICD-10-CM | POA: Diagnosis not present

## 2019-05-17 MED FILL — NORMAL SALINE FLUSH SYRINGE: 0.9 | 90 days supply | Qty: 900 | Fill #1

## 2019-05-18 DIAGNOSIS — K8021 Calculus of gallbladder without cholecystitis with obstruction: Secondary | ICD-10-CM | POA: Diagnosis not present

## 2019-05-18 DIAGNOSIS — Z4589 Encounter for adjustment and management of other implanted devices: Secondary | ICD-10-CM | POA: Diagnosis not present

## 2019-05-18 DIAGNOSIS — K8020 Calculus of gallbladder without cholecystitis without obstruction: Secondary | ICD-10-CM | POA: Diagnosis not present

## 2019-05-20 ENCOUNTER — Telehealth: Payer: Self-pay | Admitting: Cardiovascular Disease

## 2019-05-20 NOTE — Telephone Encounter (Signed)
The patient's wife has been called and notified that the appointment has been changed to a virtual visit. She will have the patient's vital signs ready prior to the appointment time.     Virtual Visit Pre-Appointment Phone Call  "(Name), I am calling you today to discuss your upcoming appointment. We are currently trying to limit exposure to the virus that causes COVID-19 by seeing patients at home rather than in the office."  1. "What is the BEST phone number to call the day of the visit?" - include this in appointment notes  2. "Do you have or have access to (through a family member/friend) a smartphone with video capability that we can use for your visit?" a. If yes - list this number in appt notes as "cell" (if different from BEST phone #) and list the appointment type as a VIDEO visit in appointment notes b. If no - list the appointment type as a PHONE visit in appointment notes  3. Confirm consent - "In the setting of the current Covid19 crisis, you are scheduled for a (phone or video) visit with your provider on (date) at (time).  Just as we do with many in-office visits, in order for you to participate in this visit, we must obtain consent.  If you'd like, I can send this to your mychart (if signed up) or email for you to review.  Otherwise, I can obtain your verbal consent now.  All virtual visits are billed to your insurance company just like a normal visit would be.  By agreeing to a virtual visit, we'd like you to understand that the technology does not allow for your provider to perform an examination, and thus may limit your provider's ability to fully assess your condition. If your provider identifies any concerns that need to be evaluated in person, we will make arrangements to do so.  Finally, though the technology is pretty good, we cannot assure that it will always work on either your or our end, and in the setting of a video visit, we may have to convert it to a phone-only visit.   In either situation, we cannot ensure that we have a secure connection.  Are you willing to proceed?" YES  4. Advise patient to be prepared - "Two hours prior to your appointment, go ahead and check your blood pressure, pulse, oxygen saturation, and your weight (if you have the equipment to check those) and write them all down. When your visit starts, your provider will ask you for this information. If you have an Apple Watch or Kardia device, please plan to have heart rate information ready on the day of your appointment. Please have a pen and paper handy nearby the day of the visit as well."  5. Give patient instructions for MyChart download to smartphone OR [email or web address] as below if video visit (depending on what platform provider is using)  6. Inform patient they will receive a phone call 15 minutes prior to their appointment time (may be from unknown caller ID) so they should be prepared to answer    TELEPHONE CALL NOTE  William [name] has been deemed a candidate for a follow-up tele-health visit to limit community exposure during the Covid-19 pandemic. I spoke with the patient via phone to ensure availability of phone/video source, confirm preferred email & phone number, and discuss instructions and expectations.  I reminded William [name] to be prepared with any vital sign and/or heart rhythm information that could potentially be obtained via  home monitoring, at the time of his visit. I reminded William [name] to expect a phone call prior to his visit.  William Barker, RN 05/20/2019 9:45 AM   INSTRUCTIONS FOR DOWNLOADING THE MYCHART APP TO SMARTPHONE  - The patient must first make sure to have activated MyChart and know their login information - If Apple, go to [employer] and type in MyChart in the search bar and download the app. If Android, ask patient to go to [employer] and type in [place] in the search bar and download the app. The app is free but as with any  other app downloads, their phone may require them to verify saved payment information or Apple/Android password.  - The patient will need to then log into the app with their MyChart username and password, and select [facility] as their healthcare provider to link the account. When it is time for your visit, go to the MyChart app, find appointments, and click Begin Video Visit. Be sure to Select Allow for your device to access the Microphone and Camera for your visit. You will then be connected, and your provider will be with you shortly.  **If they have any issues connecting, or need assistance please contact MyChart service desk [phone])**  **If using a computer, in order to ensure the best quality for their visit they will need to use either of the following Internet Browsers: [employer], or Google Chrome**  IF USING DOXIMITY or DOXY.ME - The patient will receive a link just prior to their visit by text.     FULL LENGTH CONSENT FOR TELE-HEALTH VISIT   I hereby voluntarily request, consent and authorize [facility] and its employed or contracted physicians, physician assistants, nurse practitioners or other licensed health care professionals (the Practitioner), to provide me with telemedicine health care services (the "Services") as deemed necessary by the treating Practitioner. I acknowledge and consent to receive the Services by the Practitioner via telemedicine. I understand that the telemedicine visit will involve communicating with the Practitioner through live audiovisual communication technology and the disclosure of certain medical information by electronic transmission. I acknowledge that I have been given the opportunity to request an in-person assessment or other available alternative prior to the telemedicine visit and am voluntarily participating in the telemedicine visit.  I understand that I have the right to withhold or withdraw my consent to the use of  telemedicine in the course of my care at any time, without affecting my right to future care or treatment, and that the Practitioner or I may terminate the telemedicine visit at any time. I understand that I have the right to inspect all information obtained and/or recorded in the course of the telemedicine visit and may receive copies of available information for a reasonable fee.  I understand that some of the potential risks of receiving the Services via telemedicine include:  Marland Kitchen Delay or interruption in medical evaluation due to technological equipment failure or disruption; . Information transmitted may not be sufficient (e.g. poor resolution of images) to allow for appropriate medical decision making by the Practitioner; and/or  . In rare instances, security protocols could fail, causing a breach of personal health information.  Furthermore, I acknowledge that it is my responsibility to provide information about my medical history, conditions and care that is complete and accurate to the best of my ability. I acknowledge that Practitioner's advice, recommendations, and/or decision may be based on factors not within their control, such as  incomplete or inaccurate data provided by me or distortions of diagnostic images or specimens that may result from electronic transmissions. I understand that the practice of medicine is not an exact science and that Practitioner makes no warranties or guarantees regarding treatment outcomes. I acknowledge that I will receive a copy of this consent concurrently upon execution via email to the email address I last provided but may also request a printed copy by calling the office of [facility].    I understand that my insurance will be billed for this visit.   I have read or had this consent read to me. . I understand the contents of this consent, which adequately explains the benefits and risks of the Services being provided via telemedicine.  . I have been  provided ample opportunity to ask questions regarding this consent and the Services and have had my questions answered to my satisfaction. . I give my informed consent for the services to be provided through the use of telemedicine in my medical care  By participating in this telemedicine visit I agree to the above.

## 2019-05-20 NOTE — Telephone Encounter (Signed)
New Message    Patients wife is calling on behalf of patient. She states that the patient can not come in and they want to see about making the appt virtual. The patient is unable to walk. Please call.

## 2019-05-20 NOTE — Telephone Encounter (Signed)
Okay to switch patient to a virtual visit?  Will route to MD and Primary.

## 2019-05-21 ENCOUNTER — Other Ambulatory Visit: Payer: Self-pay

## 2019-05-21 ENCOUNTER — Telehealth (INDEPENDENT_AMBULATORY_CARE_PROVIDER_SITE_OTHER): Payer: Medicare Other | Admitting: Cardiovascular Disease

## 2019-05-21 ENCOUNTER — Telehealth: Payer: Self-pay | Admitting: *Deleted

## 2019-05-21 VITALS — BP 90/60 | HR 95 | Temp 97.2°F | Resp 16 | Ht 70.0 in | Wt 185.0 lb

## 2019-05-21 DIAGNOSIS — F0390 Unspecified dementia without behavioral disturbance: Secondary | ICD-10-CM | POA: Diagnosis not present

## 2019-05-21 DIAGNOSIS — F05 Delirium due to known physiological condition: Secondary | ICD-10-CM | POA: Diagnosis not present

## 2019-05-21 DIAGNOSIS — I714 Abdominal aortic aneurysm, without rupture, unspecified: Secondary | ICD-10-CM

## 2019-05-21 DIAGNOSIS — I5022 Chronic systolic (congestive) heart failure: Secondary | ICD-10-CM

## 2019-05-21 DIAGNOSIS — I428 Other cardiomyopathies: Secondary | ICD-10-CM

## 2019-05-21 DIAGNOSIS — B9689 Other specified bacterial agents as the cause of diseases classified elsewhere: Secondary | ICD-10-CM | POA: Diagnosis not present

## 2019-05-21 DIAGNOSIS — A4181 Sepsis due to Enterococcus: Secondary | ICD-10-CM | POA: Diagnosis not present

## 2019-05-21 DIAGNOSIS — K8050 Calculus of bile duct without cholangitis or cholecystitis without obstruction: Secondary | ICD-10-CM | POA: Diagnosis not present

## 2019-05-21 DIAGNOSIS — K8031 Calculus of bile duct with cholangitis, unspecified, with obstruction: Secondary | ICD-10-CM | POA: Diagnosis not present

## 2019-05-21 DIAGNOSIS — Z8679 Personal history of other diseases of the circulatory system: Secondary | ICD-10-CM | POA: Diagnosis not present

## 2019-05-21 DIAGNOSIS — G9341 Metabolic encephalopathy: Secondary | ICD-10-CM | POA: Diagnosis not present

## 2019-05-21 DIAGNOSIS — R54 Age-related physical debility: Secondary | ICD-10-CM | POA: Diagnosis not present

## 2019-05-21 DIAGNOSIS — I2510 Atherosclerotic heart disease of native coronary artery without angina pectoris: Secondary | ICD-10-CM

## 2019-05-21 DIAGNOSIS — Z952 Presence of prosthetic heart valve: Secondary | ICD-10-CM | POA: Diagnosis not present

## 2019-05-21 DIAGNOSIS — I472 Ventricular tachycardia, unspecified: Secondary | ICD-10-CM

## 2019-05-21 DIAGNOSIS — I959 Hypotension, unspecified: Secondary | ICD-10-CM

## 2019-05-21 MED ORDER — MIDODRINE HCL 5 MG PO TABS: 5.0000 mg | ORAL_TABLET | Freq: Three times a day (TID) | ORAL | 0 refills | Status: DC | PRN

## 2019-05-21 MED ORDER — MIDODRINE HCL 5 MG PO TABS: 5.0000 mg | ORAL_TABLET | Freq: Three times a day (TID) | ORAL | 1 refills | Status: DC | PRN

## 2019-05-21 NOTE — Patient Instructions (Signed)
Medication Instructions:  TAKE: Midodrine 5 mg three times daily as needed for hypotension. Do not lie down, horizontally, for 4 hours after taking the medication.  If you need a refill on your cardiac medications before your next appointment, please call your pharmacy.   Lab work: None ordered If you have labs (blood work) drawn today and your tests are completely normal, you will receive your results only by: Marland Kitchen MyChart Message (if you have MyChart) OR . A paper copy in the mail If you have any lab test that is abnormal or we need to change your treatment, we will call you to review the results.  Testing/Procedures: None ordered  Follow-Up: At [facility], you and your health needs are our priority.  As part of our continuing mission to provide you with exceptional heart care, we have created designated Provider Care Teams.  These Care Teams include your primary Cardiologist (physician) and Advanced Practice Providers (APPs -  Physician Assistants and Nurse Practitioners) who all work together to provide you with the care you need, when you need it. You will need a follow up appointment in 3 months.  Please call our office 2 months in advance to schedule this appointment.  You may see Sanda Klein, MD or one of the following Advanced Practice Providers on your designated Care Team: [place], [place] . Fabian Sharp, PA-C

## 2019-05-21 NOTE — Telephone Encounter (Signed)
The patient has been called and instruction given from the virtual visit today with Dr. Sallyanne Kuster.   Midodrine 5 mg tid as needed has been sent into [facility] for them.  AVS will be mailed.

## 2019-05-21 NOTE — Progress Notes (Signed)
Virtual Visit via Video Note   This visit type was conducted due to national recommendations for restrictions regarding the COVID-19 Pandemic (e.g. social distancing) in an effort to limit this patient's exposure and mitigate transmission in our community.  Due to his co-morbid illnesses, this patient is at least at moderate risk for complications without adequate follow up.  This format is felt to be most appropriate for this patient at this time.  All issues noted in this document were discussed and addressed.  A limited physical exam was performed with this format.  Please refer to the patient's chart for his consent to telehealth for [facility].   Date:  05/22/2019   ID:  William [name], DOB [birth year], MRN [id number]  Patient Location: Home Provider Location: Home  PCP:  Gayland Curry, DO  Cardiologist:  Sanda Klein, MD  Electrophysiologist:  None   Evaluation Performed:  Follow-Up Visit  Chief Complaint:  CHF, VT  History of Present Illness:    William [name] is a 85 y.o. 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  Dr. Carole Civil (interventional radiology) at [facility], performed a procedure to clear the obstruction the cystic duct.  He does not have any remaining gallstones in his gallbladder no evidence of infection there, but he still has a few "floating" gallstones in his common bile duct.  He is on chronic oral antibiotics.  He has not had any recent episodes of sepsis.  They are considering repeat ERCP to extract the common bile duct stones, but the family does not want this done under general anesthesia,  which apparently was the only option at [facility].  They are exploring other options.  He is still receiving ursodiol.  His family reports that he is improved a little bit.  He is sitting up 3-4 times a day and has even walked with physical therapy up to about 15 feet, 3-4 times a week.  He is more alert and most days will talk to them, but is still very weak and frail.  He has lost a lot of weight, but he is eating well.  His blood pressure is also low, although today's recorded blood pressure of 90/60 is not typical, more commonly his systolic blood pressure is in the high 90s, low 100s.  They have run out of Midodrine.  Mrs. Keetch has occasionally given [name] some doses of carvedilol when she is detected a rapid and irregular heartbeat.  She did not have any complaints of dizziness, chest pain or shortness of breath during any of those events.  In fact, William [name] has not had any cardiovascular complaints since his last appointment.  He does not have edema and has not experienced syncope.  He denies chest pain or shortness of breath.  When the rhythm is irregular he does not report palpitations.  Of course, his review of systems may not be entirely reliable due to his worsening cognitive status.  The patient does not have symptoms concerning for COVID-19 infection (fever, chills, cough, or new shortness of breath).    Past Medical History:  Diagnosis Date  . AAA (abdominal aortic aneurysm) ([place])    7/13 3.8cm  . Arthritis    "joints" (  01/11/2014)  . Asthma    "seasonal; some foods"   . Chronic systolic CHF (congestive heart failure) ([place])   . Dementia ([place])   . HLD (hyperlipidemia)   . HOH (hard of hearing)   . Lumbar vertebral fracture (HCC)    L1- 04/11/2014   . Prostate cancer ([place])   . S/P ICD (internal cardiac defibrillator) procedure, 01/11/14 removal of ERI gen and placement of Medtronic Evera XT VR & NEW Right ventricular lead Medtronic 01/12/2014  . S/P TAVR (transcatheter aortic valve  replacement)    Edwards Sapien 3 THV (size 29 mm, model # [id number], serial # [id number])  . Severe aortic stenosis   . Sleep apnea    "lost 60# & don't have it anymore" (01/11/2014)  . Ventricular tachycardia [facility])    Past Surgical History:  Procedure Laterality Date  . CARDIAC CATHETERIZATION  08/20/2004   noncritical CAD,mild global hypokinesis, EF 50%  . CARDIAC DEFIBRILLATOR PLACEMENT  08/23/2004   Medtronic  . CATARACT EXTRACTION W/ INTRAOCULAR LENS IMPLANT Right   . COLECTOMY  1990's  . CRYOABLATION N/A 02/24/2014   Procedure: CRYO ABLATION PROSTATE;  Surgeon: Ailene Rud, MD;  Location: [facility] ORS;  Service: Urology;  Laterality: N/A;  . ERCP N/A 01/29/2019   Procedure: ENDOSCOPIC RETROGRADE CHOLANGIOPANCREATOGRAPHY (ERCP);  Surgeon: Milus Banister, MD;  Location: [facility] ENDOSCOPY;  Service: Endoscopy;  Laterality: N/A;  . HERNIA REPAIR     "abdomen; from colon OR"  . ICD LEAD REMOVAL N/A 09/15/2018   Procedure: ICD LEAD REMOVAL EXTRACTION;  Surgeon: Evans Lance, MD;  [facility]: [facility] OR;  Service: Cardiovascular;  Laterality: N/A;  DR. BARTLE TO BACK UP  . IMPLANTABLE CARDIOVERTER DEFIBRILLATOR (ICD) GENERATOR CHANGE N/A 01/11/2014   Procedure: ICD GENERATOR CHANGE;  Surgeon: Sanda Klein, MD;  Location: [place] CATH LAB;  Service: Cardiovascular;  Laterality: N/A;  . IR CATHETER TUBE CHANGE  03/01/2019  . IR CATHETER TUBE CHANGE  04/05/2019  . IR EXCHANGE BILIARY DRAIN  12/18/2018  . IR EXCHANGE BILIARY DRAIN  01/05/2019  . IR EXCHANGE BILIARY DRAIN  01/25/2019  . IR PERC CHOLECYSTOSTOMY  11/22/2018  . IR RADIOLOGIST EVAL & MGMT  02/26/2019  . IR RADIOLOGIST EVAL & MGMT  03/17/2019  . IR SINUS/FIST TUBE CHK-NON GI  01/28/2019  . LEAD REVISION N/A 01/11/2014   Procedure: LEAD REVISION;  Surgeon: Sanda Klein, MD;  Location: [place] CATH LAB;  Service: Cardiovascular;  Laterality: N/A;  . NM MYOCAR PERF WALL MOTION  01/29/2012   abnormal c/o infarct/scar,no ischemia present  . PROSTATE BIOPSY N/A  11/22/2013   Procedure: PROSTATE BIOPSY AND ULTRASOUND;  Surgeon: Ailene Rud, MD;  Location: [facility]S;  Service: Urology;  Laterality: N/A;  . REMOVAL OF STONES  01/29/2019   Procedure: REMOVAL OF STONES;  Surgeon: Milus Banister, MD;  Location: [facility] ENDOSCOPY;  Service: Endoscopy;;  . RIGHT/LEFT HEART CATH AND CORONARY ANGIOGRAPHY N/A 07/06/2018   Procedure: RIGHT/LEFT HEART CATH AND CORONARY ANGIOGRAPHY;  Surgeon: Troy Sine, MD;  Location: [place] CV LAB;  Service: Cardiovascular;  Laterality: N/A;  . SPHINCTEROTOMY  01/29/2019   Procedure: SPHINCTEROTOMY;  Surgeon: Milus Banister, MD;  Location: [facility] ENDOSCOPY;  Service: Endoscopy;;  . TEE WITHOUT CARDIOVERSION N/A 08/18/2018   Procedure: TRANSESOPHAGEAL ECHOCARDIOGRAM (TEE);  Surgeon: Burnell Blanks, MD;  Location: [place];  Service: Open Heart Surgery;  Laterality: N/A;  . TEE WITHOUT CARDIOVERSION N/A 09/08/2018   Procedure: TRANSESOPHAGEAL ECHOCARDIOGRAM (TEE);  Surgeon: Lelon Perla, MD;  Location: [facility] ENDOSCOPY;  Service: Cardiovascular;  Laterality: N/A;  . TEE WITHOUT CARDIOVERSION N/A 09/15/2018   Procedure: TRANSESOPHAGEAL ECHOCARDIOGRAM (TEE);  Surgeon: Evans Lance, MD;  Location: [facility] OR;  Service: Cardiovascular;  Laterality: N/A;  . TEE WITHOUT CARDIOVERSION N/A 11/26/2018   Procedure: TRANSESOPHAGEAL ECHOCARDIOGRAM (TEE);  Surgeon: Jerline Pain, MD;  Location: [facility] ENDOSCOPY;  Service: Cardiovascular;  Laterality: N/A;  . TRANSCATHETER AORTIC VALVE REPLACEMENT, TRANSFEMORAL  08/18/2018  . TRANSCATHETER AORTIC VALVE REPLACEMENT, TRANSFEMORAL N/A 08/18/2018   Procedure: TRANSCATHETER AORTIC VALVE REPLACEMENT, TRANSFEMORAL using an Edwards 60mm Aortic Valve;  Surgeon: Burnell Blanks, MD;  Location: [place];  Service: Open Heart Surgery;  Laterality: N/A;     Current Meds  Medication Sig  . amiodarone (PACERONE) 200 MG tablet Take 0.5 tablets (100 mg total) by mouth daily.  Marland Kitchen amoxicillin-clavulanate  (AUGMENTIN) 875-125 MG tablet Take 1 tablet by mouth 2 (two) times daily.  Marland Kitchen aspirin 81 MG chewable tablet Chew 1 tablet (81 mg total) by mouth daily.  . carvedilol (COREG) 3.125 MG tablet Take by mouth. Wife give to him when his heart rate is high.  . cholecalciferol (VITAMIN D) 1000 units tablet Take 2,000 Units by mouth at bedtime.  . potassium chloride (K-DUR) 10 MEQ tablet Take 1 tablet (10 mEq total) by mouth daily.  . sodium chloride flush (NS) 0.9 % SOLN flush drain once daily with 5-10 cc sterile saline Record output daily     Allergies:   Crestor [rosuvastatin], Lipitor [atorvastatin], and Shrimp [shellfish allergy]   Social History   Tobacco Use  . Smoking status: Never Smoker  . Smokeless tobacco: Never Used  Substance Use Topics  . Alcohol use: Yes    Alcohol/week: 0.0 standard drinks    Comment: 01/11/2014 "drink 1/2 of a beer twice per month   . Drug use: No     Family Hx: The patient's family history includes Heart failure in his mother; Pneumonia in his father.  ROS:   Please see the history of present illness.     All other systems reviewed and are negative.   Prior CV studies:   The following studies were reviewed today:  Notes from multiple specialists including general surgery and gastroenterology and infectious diseases.  Labs/Other Tests and Data Reviewed:    EKG:  An ECG dated 01/25/2019 was personally reviewed today and demonstrated:  Sinus tachycardia left bundle branch block  Recent Labs: 11/21/2018: B Natriuretic Peptide 332.5 02/03/2019: TSH 1.596 02/08/2019: ALT 24; BUN 16; Creatinine, Ser 0.90; Hemoglobin 11.9; Magnesium 2.1; Platelets 130; Potassium 3.3; Sodium 135   Recent Lipid Panel Lab Results  Component Value Date/Time   CHOL 140 01/27/2019 04:25 AM   TRIG 91 01/27/2019 04:25 AM   HDL 22 (L) 01/27/2019 04:25 AM   CHOLHDL 6.4 01/27/2019 04:25 AM   LDLCALC 100 (H) 01/27/2019 04:25 AM    Wt Readings from Last 3 Encounters:   05/21/19 185 lb (83.9 kg)  02/16/19 203 lb (92.1 kg)  02/06/19 199 lb 1.2 oz (90.3 kg)     Objective:    Vital Signs:  BP 90/60   Pulse 95   Temp (!) 97.2 F (36.2 C) (Axillary)   Resp 16   Ht 5\' 10"  (1.778 m)   Wt 185 lb (83.9 kg)   SpO2 98%   BMI 26.54 kg/m   Unable to examine ASSESSMENT & PLAN:    1. Choledocolithiasis/recurrent biliary sepsis: Gallbladder/cystic duct obstruction has been resolved and he has not had  signs of active infection since then, but he still has stones in the common bile duct that are likely infected and that could cause ascending cholangitis, acute pancreatitis or biliary obstruction in the future.  Interventional radiologist at [facility] has suggested repeat evaluation for another ERCP/stone extraction.  If this is performed, the family does not want to do under general anesthesia.  2. NICM/chronic systolic CHF - he is able to lie flat in bed without dyspnea and has no edema.  Very difficult to assess his volume status with a phone conversation, obviously and we rely on the family's report.  In the past, he has always tolerated the depressed systolic function without need for daily diuretic therapy.  3. Ventricular tachycardia - he has no symptoms of this, but his wife has occasionally given him carvedilol for a rapid or irregular rhythm.  I advised against this unless he is symptomatic.  I think he is more likely to be harmed by hypotension from carvedilol.  Continue low-dose amiodarone.  In the past, attempts to completely wean off this medication have led to recurrent sustained VT.  His liver function tests are being checked frequently and have been normal.  Last TSH in April 2020 was also normal.  4. Severe AS s/p TAVR - TEE 11/2018 showed normal AV prosthesis.  He is at high risk for prosthetic valve endocarditis with his recurrent episodes of enterococcal bacteremia.  5. CAD -recent cardiac catheterization showed this to be moderate.  He does not have  angina pectoris.  6. AAA - 4. 6 cm x 5.2cm by February 2020 ultrasound; CT around the same time showed advanced atherosclerotic calcifications involving the aorta. 4.6 x 4.0 cm infrarenal abdominal aortic aneurysm with a left-sided saccular component.  He is not a candidate for surgical or percutaneous graft repair.  We will not monitor this routinely.  7.  Hypotension - I cautioned his wife not to give him carvedilol unless he has both tachycardia and symptoms.  This has not happened to date.  It makes no sense to give him carvedilol when he requires Midodrine.     COVID-19 Education: The signs and symptoms of COVID-19 were discussed with the patient and how to seek care for testing (follow up with PCP or arrange E-visit).  The importance of social distancing was discussed today.  Time:   Today, I have spent 27 minutes with the patient with telehealth technology discussing the above problems.     Medication Adjustments/Labs and Tests Ordered: Current medicines are reviewed at length with the patient today.  Concerns regarding medicines are outlined above.   Tests Ordered: No orders of the defined types were placed in this encounter.   Medication Changes: Meds ordered this encounter  Medications  . DISCONTD: midodrine (PROAMATINE) 5 MG tablet    Sig: Take 1 tablet (5 mg total) by mouth 3 (three) times daily as needed (For hypotension.). Do not lie down for 4 hours after taking the medication.    Dispense:  90 tablet    Refill:  0    Patient Instructions  Medication Instructions:  TAKE: Midodrine 5 mg three times daily as needed for hypotension. Do not lie down, horizontally, for 4 hours after taking the medication.  If you need a refill on your cardiac medications before your next appointment, please call your pharmacy.   Lab work: None ordered If you have labs (blood work) drawn today and your tests are completely normal, you will receive your results only by: Marland Kitchen MyChart  Message (if you have MyChart) OR . A paper copy in the mail If you have any lab test that is abnormal or we need to change your treatment, we will call you to review the results.  Testing/Procedures: None ordered  Follow-Up: At [facility], you and your health needs are our priority.  As part of our continuing mission to provide you with exceptional heart care, we have created designated Provider Care Teams.  These Care Teams include your primary Cardiologist (physician) and Advanced Practice Providers (APPs -  Physician Assistants and Nurse Practitioners) who all work together to provide you with the care you need, when you need it. You will need a follow up appointment in 3 months.  Please call our office 2 months in advance to schedule this appointment.  You may see Sanda Klein, MD or one of the following Advanced Practice Providers on your designated Care Team: [place], [place] . Fabian Sharp, PA-C      Signed, Sanda Klein, MD  05/22/2019 12:11 PM    [place]

## 2019-05-21 NOTE — Telephone Encounter (Signed)
Refill

## 2019-05-22 DIAGNOSIS — I959 Hypotension, unspecified: Secondary | ICD-10-CM | POA: Insufficient documentation

## 2019-05-24 ENCOUNTER — Inpatient Hospital Stay (HOSPITAL_COMMUNITY): Admission: RE | Admit: 2019-05-24 | Payer: Medicare Other | Source: Ambulatory Visit

## 2019-05-24 ENCOUNTER — Encounter (HOSPITAL_COMMUNITY): Payer: Self-pay

## 2019-05-24 DIAGNOSIS — F0390 Unspecified dementia without behavioral disturbance: Secondary | ICD-10-CM | POA: Diagnosis not present

## 2019-05-24 DIAGNOSIS — F05 Delirium due to known physiological condition: Secondary | ICD-10-CM | POA: Diagnosis not present

## 2019-05-24 DIAGNOSIS — G9341 Metabolic encephalopathy: Secondary | ICD-10-CM | POA: Diagnosis not present

## 2019-05-24 DIAGNOSIS — K8031 Calculus of bile duct with cholangitis, unspecified, with obstruction: Secondary | ICD-10-CM | POA: Diagnosis not present

## 2019-05-24 DIAGNOSIS — A4181 Sepsis due to Enterococcus: Secondary | ICD-10-CM | POA: Diagnosis not present

## 2019-05-24 DIAGNOSIS — B9689 Other specified bacterial agents as the cause of diseases classified elsewhere: Secondary | ICD-10-CM | POA: Diagnosis not present

## 2019-05-28 DIAGNOSIS — A4181 Sepsis due to Enterococcus: Secondary | ICD-10-CM | POA: Diagnosis not present

## 2019-05-28 DIAGNOSIS — K8031 Calculus of bile duct with cholangitis, unspecified, with obstruction: Secondary | ICD-10-CM | POA: Diagnosis not present

## 2019-05-28 DIAGNOSIS — F05 Delirium due to known physiological condition: Secondary | ICD-10-CM | POA: Diagnosis not present

## 2019-05-28 DIAGNOSIS — F0390 Unspecified dementia without behavioral disturbance: Secondary | ICD-10-CM | POA: Diagnosis not present

## 2019-05-28 DIAGNOSIS — G9341 Metabolic encephalopathy: Secondary | ICD-10-CM | POA: Diagnosis not present

## 2019-05-28 DIAGNOSIS — B9689 Other specified bacterial agents as the cause of diseases classified elsewhere: Secondary | ICD-10-CM | POA: Diagnosis not present

## 2019-05-31 DIAGNOSIS — F0390 Unspecified dementia without behavioral disturbance: Secondary | ICD-10-CM | POA: Diagnosis not present

## 2019-05-31 DIAGNOSIS — A4181 Sepsis due to Enterococcus: Secondary | ICD-10-CM | POA: Diagnosis not present

## 2019-05-31 DIAGNOSIS — K8031 Calculus of bile duct with cholangitis, unspecified, with obstruction: Secondary | ICD-10-CM | POA: Diagnosis not present

## 2019-05-31 DIAGNOSIS — F05 Delirium due to known physiological condition: Secondary | ICD-10-CM | POA: Diagnosis not present

## 2019-05-31 DIAGNOSIS — B9689 Other specified bacterial agents as the cause of diseases classified elsewhere: Secondary | ICD-10-CM | POA: Diagnosis not present

## 2019-05-31 DIAGNOSIS — G9341 Metabolic encephalopathy: Secondary | ICD-10-CM | POA: Diagnosis not present

## 2019-06-04 DIAGNOSIS — K8031 Calculus of bile duct with cholangitis, unspecified, with obstruction: Secondary | ICD-10-CM | POA: Diagnosis not present

## 2019-06-04 DIAGNOSIS — A4181 Sepsis due to Enterococcus: Secondary | ICD-10-CM | POA: Diagnosis not present

## 2019-06-04 DIAGNOSIS — F05 Delirium due to known physiological condition: Secondary | ICD-10-CM | POA: Diagnosis not present

## 2019-06-04 DIAGNOSIS — G9341 Metabolic encephalopathy: Secondary | ICD-10-CM | POA: Diagnosis not present

## 2019-06-04 DIAGNOSIS — B9689 Other specified bacterial agents as the cause of diseases classified elsewhere: Secondary | ICD-10-CM | POA: Diagnosis not present

## 2019-06-04 DIAGNOSIS — F0390 Unspecified dementia without behavioral disturbance: Secondary | ICD-10-CM | POA: Diagnosis not present

## 2019-06-08 DIAGNOSIS — A4181 Sepsis due to Enterococcus: Secondary | ICD-10-CM | POA: Diagnosis not present

## 2019-06-08 DIAGNOSIS — I472 Ventricular tachycardia: Secondary | ICD-10-CM | POA: Diagnosis not present

## 2019-06-08 DIAGNOSIS — K819 Cholecystitis, unspecified: Secondary | ICD-10-CM | POA: Diagnosis not present

## 2019-06-08 DIAGNOSIS — F0390 Unspecified dementia without behavioral disturbance: Secondary | ICD-10-CM | POA: Diagnosis not present

## 2019-06-08 DIAGNOSIS — I5022 Chronic systolic (congestive) heart failure: Secondary | ICD-10-CM | POA: Diagnosis not present

## 2019-06-10 ENCOUNTER — Ambulatory Visit (INDEPENDENT_AMBULATORY_CARE_PROVIDER_SITE_OTHER): Payer: Medicare Other | Admitting: Infectious Disease

## 2019-06-10 ENCOUNTER — Ambulatory Visit (INDEPENDENT_AMBULATORY_CARE_PROVIDER_SITE_OTHER): Payer: Medicare Other | Admitting: Family

## 2019-06-10 ENCOUNTER — Encounter: Payer: Self-pay | Admitting: Family

## 2019-06-10 ENCOUNTER — Encounter: Payer: Self-pay | Admitting: Infectious Disease

## 2019-06-10 ENCOUNTER — Telehealth: Payer: Self-pay | Admitting: Gastroenterology

## 2019-06-10 ENCOUNTER — Other Ambulatory Visit: Payer: Self-pay

## 2019-06-10 VITALS — BP 100/70 | HR 95 | Temp 96.8°F | Resp 20

## 2019-06-10 DIAGNOSIS — K8064 Calculus of gallbladder and bile duct with chronic cholecystitis without obstruction: Secondary | ICD-10-CM

## 2019-06-10 DIAGNOSIS — B961 Klebsiella pneumoniae [K. pneumoniae] as the cause of diseases classified elsewhere: Secondary | ICD-10-CM

## 2019-06-10 DIAGNOSIS — K1120 Sialoadenitis, unspecified: Secondary | ICD-10-CM | POA: Diagnosis not present

## 2019-06-10 DIAGNOSIS — K8309 Other cholangitis: Secondary | ICD-10-CM | POA: Diagnosis not present

## 2019-06-10 DIAGNOSIS — K1121 Acute sialoadenitis: Secondary | ICD-10-CM | POA: Diagnosis not present

## 2019-06-10 DIAGNOSIS — R7881 Bacteremia: Secondary | ICD-10-CM

## 2019-06-10 DIAGNOSIS — B952 Enterococcus as the cause of diseases classified elsewhere: Secondary | ICD-10-CM

## 2019-06-10 HISTORY — DX: Sialoadenitis, unspecified: K11.20

## 2019-06-10 MED ORDER — DOXYCYCLINE HYCLATE 100 MG PO TABS: 100.0000 mg | ORAL_TABLET | Freq: Two times a day (BID) | ORAL | 1 refills | Status: DC

## 2019-06-10 NOTE — Telephone Encounter (Signed)
Line busy

## 2019-06-10 NOTE — Progress Notes (Signed)
Virtual Visit via Telephone Note  I connected with William [name] on [birth year] at  9:45 AM EDT by telephone and verified that I am speaking with the correct person using two identifiers.  Location: Patient: Home Provider: My home   I discussed the limitations, risks, security and privacy concerns of performing an evaluation and management service by telephone and the availability of in person appointments. I also discussed with the patient that there may be a patient responsible charge related to this service. The patient expressed understanding and agreed to proceed.  Note the discussions were entirely with his wife and daughter who are the surrogate decision makers for the patient he was present but the conversation was entirely with surrogate decision makers.   History of Present Illness:  85 y.o. male with recurrent episodes of cholangitis and sepsis at times with bacteremias including in time this fall when he had to have his ICD removed, again remitted admitted with cholangitis and sepsis in the context of an obstructed cholecystostomy tube, and concern for potential obstruction of his biliary tree due to stones.  He was found not to be a surgical candidate which seems quite reasonable to me given his multiple comorbidities including his dementia.  GI were able to remove multiple stones during his admission and cultures from the biliary tree yielded ampicillin sensitive enterococcal species and an Enterobacter species.  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  Patient was met with with palliative care on multiple occasions but his family continued to want "aggressive care".  They have been seeming to want to have a second opinion from a surgeon which seems not very realistic especially in the current environment.  Ultimately arrangements were made for him to continue on the meropenem through May 10.  At the last visit  I had  a lengthy discussion with the patient his wife and 2 daughters about the fact that he was coming to the end of his life.  I again explained to him that absent of surgery he was going to have recurrent episodes of sepsis from the biliary tree given that there is still a large stone in the gallbladder that GI could not remove.  They continued to think that another surgeon might operate.  I told him that I felt that almost all surgeons that could be asked for second opinion would not be enthusiastic about operating on this 85 year old man with multiple comorbidities and known dementia.  I explained also that even anesthesia could potentially impact his further quality of life and potentially worsen his dementia among other things.  I also pointed out that by continuing to have him at home with home health nurses visiting him he was not only being put at risk himself of get acquiring novel coronavirus 12/02/2017 but also out of his wife who is also in her 49s and his 2 daughters.  His wife and daughters accepted this risk.  I also pointed out though that this risk extend outside her family to others who might not be so accepting of this potential risk for infection.  After a lengthy discussion which lasted more than 40 minutes at last visit ultimately we decided to continue his antibiotics through the stop date which is either the 10th of the eighth.  And then I offered to place him on indefinite Bactrim and Augmentin which would cover the most recent pathogens isolated from his biliary tree but not the ESBL that  he had previously.  Since so doing he has remained on Augmentin and Bactrim and has done relatively well.  He has been stable.  He has been seen by interventional radiology which have changed his drain.  They cultured his bile which had multiple calculi in it but it did not appear purulent or not documented as such. Culture was taken which I woud NOT have preferred UNLESS it was purulent. 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  He was seen at [facility] and underwent 1. Percutaneous gallstone extraction.  2. Cystic duct recanalization and balloon dilation  3. Cholecystostomy tube exchange. on August the 4th, 2020  They found   1. Minimal residual cholelithiasis with successful clearance of the  remaining gallstones.  2. Focal stenosis and occlusion of the cystic duct with successful  recanalization and balloon dilation to 43m. The cystic duct is now widely  patent.  3. Choledocholithiasis without evidence of CBD obstruction.  4 Successful exchange of a new 16 french cholecystostomy tube.   Given the large amount of gallstones found in GB decision made to leave the cholecystostomy in place and have it exchanged every 3 months  I DO think we can trial him off of antibiotics at this point  The family wanted and have stopped the ACTIGALl though I would recommend continuing it. They are to talk with GI re this.  JDraperhas in the interim developed swelling of what sounds like his parotid gland that is been present for roughly 10 days.  Is not improved he does not have fevers chills confusion or other systemic symptoms.  I am concerned however he could have a MRSA infection in this area that might be resistant to the Bactrim that he is been taking chronically.     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  He will need to be closely monitored.  Possible bacterial parotitis: I am going prescribe doxycycline for 2 weeks and then check him via virtual visit he can use lemon drops as well as counseled that if he worsens he needs to seek care urgently.    Follow Up Instructions:     I discussed the assessment and treatment plan with the patient. The patient was provided an opportunity to ask questions and all were answered. The patient agreed with the plan and demonstrated an understanding of the instructions.   The patient was advised to call back or seek an in-person evaluation if the symptoms worsen or if the condition fails to improve as anticipated.  I provided 24  minutes of non-face-to-face time during this encounter.   Alcide Evener, MD

## 2019-06-10 NOTE — Telephone Encounter (Signed)
The pt has been to [facility] the past few months and had gallbladder emptied the of gallstones (July 2020) and cystic duct is open.  Has a few "floating stones in the CBD."  He would like to know if he should resume ursidol. It was stopped on August 5 due to inability to take so many pills.  The pt and his wife would like Dr Ardis Hughs to be aware of what is going on and keep him informed of what has happened.  The pt's wife says she does not think he needs an appt but wants to keep communication open.

## 2019-06-10 NOTE — Progress Notes (Signed)
Thanks for the update

## 2019-06-10 NOTE — Patient Instructions (Signed)
1. Continue on Doxycycline 100 mg tablet twice daily x 14 days as directed by infectious disease specialist   2.  Notify PCP if gland remains swollen or symptoms worsen after completing antibiotics.  3. Also Notify provider if running any fever > 100.5  4. Tylenol 500 mg tablet one by mouth every 8 hours as needed for pain.

## 2019-06-10 NOTE — Telephone Encounter (Signed)
Ok. Yes, he should still take the urso as it was previously described.

## 2019-06-10 NOTE — Progress Notes (Signed)
This service is provided via telemedicine  No vital signs collected/recorded due to the encounter was a telemedicine visit.   Location of patient (ex: home, work):  Home   Patient consents to a telephone visit:  Yes   Location of the provider (ex: office, home):  Office   Name of any referring provider:  Dr. Hollace Kinnier DO   Names of all persons participating in the telemedicine service and their role in the encounter:  Marlowe Sax, NP , Ruthell Rummage CMA, Virl Axe , and wife Ann  Time spent on call:  Ruthell Rummage CMA spent 6 minutes on phone with patient     [facility] clinic  Provider: Marlowe Sax NP  Code Status: FULL Goals of Care:  Advanced Directives 01/24/2019  Does Patient Have a Medical Advance Directive? Unable to assess, patient is non-responsive or altered mental status  Type of Advance Directive -  Does patient want to make changes to medical advance directive? -  Copy of [place] in Chart? -  Would patient like information on creating a medical advance directive? -  Pre-existing out of facility DNR order (yellow form or pink MOST form) -     Chief Complaint  Patient presents with  . Acute Visit    Patient c/o swollen gland at left ear currently taking doxycycline but would like a referral to ENT     HPI: Patient is a 85 y.o. male seen today for an acute visit for evaluation of swollen gland on left ear area.Patient's wife provides the HPI information states patient unable to provide information due to dementia.she states patient was seen today by infectious disease specialist and Doxycycline 100 mg tablet one by mouth twice daily x 14 days was ordered for swollen gland.she states calling to inform PCP that if the antibiotics does not work she needs patient referred to a ENT specialist.she states patient has just taken the first dose of doxycycline prior to her calling.she states patient has no drainage, fever,or chills.No shortness of  breath,diffuclties swallowing or effects of swelling to eye area.I've discussed with patient's wife to give time to the oral antibiotics to work then will decide if ENT referral is required.  She would also like PCP Dr.Reed to be made aware that since patient was seen here,all patient's Gallstones have been removed at [facility].On chart review,Infectious Disease specialist Dr. Rhina Brackett Dam notes patient's status post percutaneous gallstones extraction at [facility] intervention radilogy with large gallstone removed.Cystitic duct recanalization and balloon dilatation cholecystostomy tube exchange 05/18/2019 with plans for tube exchange every 3 months.ID discontinued Bactrim Ds since all gall stones have been extracted.Patient's family also stopped Ursodiol.       Past Medical History:  Diagnosis Date  . AAA (abdominal aortic aneurysm) ([place])    7/13 3.8cm  . Arthritis    "joints" (01/11/2014)  . Asthma    "seasonal; some foods"   . Chronic systolic CHF (congestive heart failure) ([place])   . Dementia ([place])   . HLD (hyperlipidemia)   . HOH (hard of hearing)   . Lumbar vertebral fracture ([facility])    L1- 04/11/2014   . Parotitis 06/10/2019  . Prostate cancer ([place])   . S/P ICD (internal cardiac defibrillator) procedure, 01/11/14 removal of ERI gen and placement of Medtronic Evera XT VR & NEW Right ventricular lead Medtronic 01/12/2014  . S/P TAVR (transcatheter aortic valve replacement)    Edwards Sapien 3 THV (size 29 mm, model #  [id number], serial # [id number])  . Severe aortic stenosis   . Sleep apnea    "lost 60# & don't have it anymore" (01/11/2014)  . Ventricular tachycardia [facility])     Past Surgical History:  Procedure Laterality Date  . CARDIAC CATHETERIZATION  08/20/2004   noncritical CAD,mild global hypokinesis, EF 50%  . CARDIAC DEFIBRILLATOR PLACEMENT  08/23/2004   Medtronic  . CATARACT EXTRACTION W/ INTRAOCULAR LENS IMPLANT Right   . COLECTOMY   1990's  . CRYOABLATION N/A 02/24/2014   Procedure: CRYO ABLATION PROSTATE;  Surgeon: Ailene Rud, MD;  Location: [facility] ORS;  Service: Urology;  Laterality: N/A;  . ERCP N/A 01/29/2019   Procedure: ENDOSCOPIC RETROGRADE CHOLANGIOPANCREATOGRAPHY (ERCP);  Surgeon: Milus Banister, MD;  Location: [facility] ENDOSCOPY;  Service: Endoscopy;  Laterality: N/A;  . HERNIA REPAIR     "abdomen; from colon OR"  . ICD LEAD REMOVAL N/A 09/15/2018   Procedure: ICD LEAD REMOVAL EXTRACTION;  Surgeon: Evans Lance, MD;  Location: [facility] OR;  Service: Cardiovascular;  Laterality: N/A;  DR. BARTLE TO BACK UP  . IMPLANTABLE CARDIOVERTER DEFIBRILLATOR (ICD) GENERATOR CHANGE N/A 01/11/2014   Procedure: ICD GENERATOR CHANGE;  Surgeon: Sanda Klein, MD;  Location: [facility] CATH LAB;  Service: Cardiovascular;  Laterality: N/A;  . IR CATHETER TUBE CHANGE  03/01/2019  . IR CATHETER TUBE CHANGE  04/05/2019  . IR EXCHANGE BILIARY DRAIN  12/18/2018  . IR EXCHANGE BILIARY DRAIN  01/05/2019  . IR EXCHANGE BILIARY DRAIN  01/25/2019  . IR PERC CHOLECYSTOSTOMY  11/22/2018  . IR RADIOLOGIST EVAL & MGMT  02/26/2019  . IR RADIOLOGIST EVAL & MGMT  03/17/2019  . IR SINUS/FIST TUBE CHK-NON GI  01/28/2019  . LEAD REVISION N/A 01/11/2014   Procedure: LEAD REVISION;  Surgeon: Sanda Klein, MD;  Location: [facility] CATH LAB;  Service: Cardiovascular;  Laterality: N/A;  . NM MYOCAR PERF WALL MOTION  01/29/2012   abnormal c/o infarct/scar,no ischemia present  . PROSTATE BIOPSY N/A 11/22/2013   Procedure: PROSTATE BIOPSY AND ULTRASOUND;  Surgeon: Ailene Rud, MD;  Location: [facility];  Service: Urology;  Laterality: N/A;  . REMOVAL OF STONES  01/29/2019   Procedure: REMOVAL OF STONES;  Surgeon: Milus Banister, MD;  Location: [facility] ENDOSCOPY;  Service: Endoscopy;;  . RIGHT/LEFT HEART CATH AND CORONARY ANGIOGRAPHY N/A 07/06/2018   Procedure: RIGHT/LEFT HEART CATH AND CORONARY ANGIOGRAPHY;  Surgeon: Troy Sine, MD;  Location: [facility] CV LAB;  Service:  Cardiovascular;  Laterality: N/A;  . SPHINCTEROTOMY  01/29/2019   Procedure: SPHINCTEROTOMY;  Surgeon: Milus Banister, MD;  Location: [facility] ENDOSCOPY;  Service: Endoscopy;;  . TEE WITHOUT CARDIOVERSION N/A 08/18/2018   Procedure: TRANSESOPHAGEAL ECHOCARDIOGRAM (TEE);  Surgeon: Burnell Blanks, MD;  Location: [facility];  Service: Open Heart Surgery;  Laterality: N/A;  . TEE WITHOUT CARDIOVERSION N/A 09/08/2018   Procedure: TRANSESOPHAGEAL ECHOCARDIOGRAM (TEE);  Surgeon: Lelon Perla, MD;  Location: [facility] ENDOSCOPY;  Service: Cardiovascular;  Laterality: N/A;  . TEE WITHOUT CARDIOVERSION N/A 09/15/2018   Procedure: TRANSESOPHAGEAL ECHOCARDIOGRAM (TEE);  Surgeon: Evans Lance, MD;  Location: [facility] OR;  Service: Cardiovascular;  Laterality: N/A;  . TEE WITHOUT CARDIOVERSION N/A 11/26/2018   Procedure: TRANSESOPHAGEAL ECHOCARDIOGRAM (TEE);  Surgeon: Jerline Pain, MD;  Location: [facility] ENDOSCOPY;  Service: Cardiovascular;  Laterality: N/A;  . TRANSCATHETER AORTIC VALVE REPLACEMENT, TRANSFEMORAL  08/18/2018  . TRANSCATHETER AORTIC VALVE REPLACEMENT, TRANSFEMORAL N/A 08/18/2018   Procedure: TRANSCATHETER AORTIC VALVE REPLACEMENT, TRANSFEMORAL using an Edwards 4mm Aortic Valve;  Surgeon:  Burnell Blanks, MD;  Location: [place];  Service: Open Heart Surgery;  Laterality: N/A;    Allergies  Allergen Reactions  . Crestor [Rosuvastatin] Other (See Comments)    Hurts muscles  . Lipitor [Atorvastatin] Other (See Comments)    Hurts stomach  . Shrimp [Shellfish Allergy] Other (See Comments)    On MAR    Outpatient Encounter Medications as of 06/10/2019  Medication Sig  . acetaminophen (TYLENOL) 500 MG tablet Take 500 mg by mouth every 6 (six) hours as needed (for pain).   Marland Kitchen amiodarone (PACERONE) 200 MG tablet Take 0.5 tablets (100 mg total) by mouth daily.  Marland Kitchen aspirin 81 MG chewable tablet Chew 1 tablet (81 mg total) by mouth daily.  . carvedilol (COREG) 3.125 MG tablet Take by mouth. Wife give to  him when his heart rate is high.  . cholecalciferol (VITAMIN D) 1000 units tablet Take 2,000 Units by mouth at bedtime.  Marland Kitchen doxycycline (VIBRA-TABS) 100 MG tablet Take 1 tablet (100 mg total) by mouth 2 (two) times daily.  . midodrine (PROAMATINE) 5 MG tablet Take 1 tablet (5 mg total) by mouth 3 (three) times daily as needed (For hypotension.). Do not lie down for 4 hours after taking the medication.  . polyethylene glycol powder (GLYCOLAX/MIRALAX) 17 GM/SCOOP powder MIX 17 GRAMS IN 8 OUNCES OF WATER EVERY DAY.  Marland Kitchen potassium chloride (K-DUR) 10 MEQ tablet Take 1 tablet (10 mEq total) by mouth daily.  . sodium chloride flush (NS) 0.9 % SOLN flush drain once daily with 5-10 cc sterile saline Record output daily  . ursodiol (ACTIGALL) 300 MG capsule TAKE 3 CAPSULES BY MOUTH THREE TIMES DAILY (Patient not taking: Reported on 05/21/2019)   No facility-administered encounter medications on file as of 06/10/2019.     Review of Systems:  Review of Systems  Unable to perform ROS: Other (HPI provided by patient's wife )  Constitutional: Negative for appetite change, chills, fatigue and fever.  HENT: Negative for congestion, rhinorrhea, sinus pressure, sinus pain, sneezing and sore throat.        Enlarged gland belowLeft ear.Tender to touch per wife   Eyes: Negative for pain, discharge, redness and itching.  Respiratory: Negative for cough, chest tightness, shortness of breath and wheezing.   Cardiovascular: Negative for chest pain, palpitations and leg swelling.  Skin: Negative for pallor and rash.       Enlarged gland below left ear skin redness noted by wife .  Neurological: Negative for dizziness, light-headedness and headaches.    Health Maintenance  Topic Date Due  . TETANUS/TDAP  11/02/1946  . INFLUENZA VACCINE  05/15/2019  . PNA vac Low Risk Adult  Completed    Physical Exam: There were no vitals filed for this visit. There is no height or weight on file to calculate BMI. Physical Exam  unable to complete on telephone visit.   Labs reviewed: Basic Metabolic Panel: Recent Labs    11/25/18 0357 11/26/18 0430 11/27/18 0404  01/25/19 0305  02/03/19 0349  02/06/19 0242 02/07/19 0255 02/08/19 0327  NA 139 138  --    < > 138   < > 131*   < > 136 135 135  K 3.7 3.1*  --    < > 3.7   < > 3.9   < > 3.6 4.0 3.3*  CL 108 109  --    < > 103   < > 102   < > 106 104 101  CO2 25 24  --    < >  23   < > 20*   < > 22 21* 24  GLUCOSE 135* 133*  --    < > 151*   < > 176*   < > 125* 119* 107*  BUN 14 12  --    < > 15   < > 12   < > 17 16 16   CREATININE 0.88 0.81  --    < > 1.19   < > 1.03   < > 0.88 0.95 0.90  CALCIUM 7.6* 7.6*  --    < > 8.5*   < > 8.7*   < > 8.2* 8.4* 8.5*  MG 2.0 2.0 2.0  --   --    < >  --    < > 2.0 2.0 2.1  PHOS 2.2* 2.7 3.3  --   --   --   --   --   --   --   --   TSH  --   --   --   --  1.043  --  1.596  --   --   --   --    < > = values in this interval not displayed.   Liver Function Tests: Recent Labs    02/06/19 0242 02/07/19 0255 02/08/19 0327  AST 30 33 26  ALT 24 25 24   ALKPHOS 120 121 114  BILITOT 0.9 0.9 1.2  PROT 5.8* 6.0* 5.9*  ALBUMIN 2.1* 2.2* 2.2*   Recent Labs    08/07/18 1411 11/21/18 0851 11/22/18 1229 12/12/18 1535 01/24/19 1935  LIPASE 26 20 18 24  51  AMYLASE 19* 10* 8*  --   --    Recent Labs    12/12/18 1528 01/24/19 2317 02/03/19 0349  AMMONIA 9 26 20    CBC: Recent Labs    12/12/18 1535  01/24/19 1935 01/25/19 0305  02/06/19 0242 02/07/19 0255 02/08/19 0327  WBC 8.3   < > 11.1* 10.6*   < > 5.8 5.6 5.7  NEUTROABS 5.4  --  10.0* 9.0*  --   --   --   --   HGB 12.5*   < > 12.8* 12.0*   < > 11.3* 12.5* 11.9*  HCT 37.7*   < > 36.6* 35.9*   < > 32.4* 37.2* 33.8*  MCV 91.5  --  89.9 89.8   < > 89.8 93.5 88.3  PLT 166   < > 146* 132*   < > 138* 153 130*   < > = values in this interval not displayed.   Lipid Panel: Recent Labs    01/26/19 0257 01/27/19 0425  CHOL 139 140  HDL 22* 22*  LDLCALC 101* 100*   TRIG 81 91  CHOLHDL 6.3 6.4   Lab Results  Component Value Date   HGBA1C 5.7 (H) 01/27/2019    Procedures since last visit:  Assessment/Plan   Acute parotitis Afebrile.Patient's wife encouraged to give Doxycycline 100 mg tablet twice daily x 14 days as directed then notify PCP if gland remains swollen or symptoms worsen.Also Notify provider if running any fever > 100.5  - Tylenol 500 mg tablet one by mouth every 8 hours as needed for pain.   Labs/tests ordered: None  Next appt: PRN  Spent 11 minutes of non-face to face with patient

## 2019-06-11 NOTE — Telephone Encounter (Signed)
The patient has been notified of this information and all questions answered. The pt was also scheduled for a follow up on 9/2 with Dr Ardis Hughs.

## 2019-06-15 ENCOUNTER — Telehealth: Payer: Self-pay

## 2019-06-15 NOTE — Telephone Encounter (Signed)
Patient's wife called stating she is not sure the doxycycline is responding to his infection (Parotitis). States his glands remain swollen and his throat is hurting when he swallows at times.  Patient has now been on doxy for 5 days. Wife ask should they continue doxy or possibly go back on amoxicillin. Routing to provider for advise.  Eugenia Mcalpine

## 2019-06-16 ENCOUNTER — Ambulatory Visit: Payer: Medicare Other | Admitting: Gastroenterology

## 2019-06-16 NOTE — Telephone Encounter (Signed)
Spoke with wife Deneise Lever and made her aware of Dr. Lucianne Lei Dam's advise. Wife states patient is feeling better today, but she will take Dr. Tommy Medal advise and call PCP.  Eugenia Mcalpine

## 2019-06-16 NOTE — Progress Notes (Deleted)
Review of pertinent gastrointestinal problems: 1.  Complicated biliary disease; acute cholecystitis February 2020, he was felt to be a poor surgical candidate and so underwent cholecystostomy drain placement via IR.  He was critically ill with gram-negative bacteremia, sepsis.  Clinically improved until April 2020 when he we presented with decreased drain output, elevated liver tests, again gram-negative rods and also gram-positive cocci in his blood.  Drain study showed his cholecystostomy drain was occluded, cystic duct remained obstructed, there was purulence in his gallbladder.  Eventual MRI with MRCP showed multiple common bile duct stones also stones in his gallbladder.  ERCP Dr. Ardis Hughs January 29, 2019 biliary sphincterotomy, removed multiple black bile duct stones, cystic duct not opacified.  The bile duct appeared clear following the ERCP.  Surgery input again felt he was not a surgical candidate.  He became somewhat encephalopathic following ERCP. he was discharged home on meropenem and he is being followed by infectious disease.  Family requested evaluation at [facility].  Was seen by [facility] gastroenterology and [place] interventional radiology.  From June 2000 05 June 2019 he underwent a variety of interventional radiologic procedures 2 of them here in town and at least 2 of them at [place].  Most recent IR procedure early August 2020 documents final clearance of the gallbladder through radiologic techniques, recannulation of the cystic duct, documentation of multiple gallstones in the common bile duct with free flow of contrast into the small bowel.  The radiologist recommended that the patient be evaluated by Dr. [name] at [facility] to consider repeat ERCP to clear his bile duct.

## 2019-06-16 NOTE — Telephone Encounter (Signed)
Very good thanks so much! 

## 2019-06-16 NOTE — Telephone Encounter (Signed)
William [name] needs to be evaluated by a provider. If not PCP urgent care of ER. He was not improving on amoxicillin either as he developed this on amox

## 2019-06-22 ENCOUNTER — Telehealth: Payer: Self-pay | Admitting: Gastroenterology

## 2019-06-22 NOTE — Telephone Encounter (Signed)
I spoke to patients wife and she will discus his medications at his next office visit

## 2019-06-23 ENCOUNTER — Other Ambulatory Visit: Payer: Self-pay

## 2019-06-23 ENCOUNTER — Ambulatory Visit (INDEPENDENT_AMBULATORY_CARE_PROVIDER_SITE_OTHER): Payer: Medicare Other | Admitting: Infectious Disease

## 2019-06-23 ENCOUNTER — Encounter: Payer: Self-pay | Admitting: Infectious Disease

## 2019-06-23 DIAGNOSIS — K1120 Sialoadenitis, unspecified: Secondary | ICD-10-CM

## 2019-06-23 DIAGNOSIS — K810 Acute cholecystitis: Secondary | ICD-10-CM | POA: Diagnosis not present

## 2019-06-23 DIAGNOSIS — K8064 Calculus of gallbladder and bile duct with chronic cholecystitis without obstruction: Secondary | ICD-10-CM

## 2019-06-23 DIAGNOSIS — K8309 Other cholangitis: Secondary | ICD-10-CM | POA: Diagnosis not present

## 2019-06-23 DIAGNOSIS — F0390 Unspecified dementia without behavioral disturbance: Secondary | ICD-10-CM

## 2019-06-23 DIAGNOSIS — K8050 Calculus of bile duct without cholangitis or cholecystitis without obstruction: Secondary | ICD-10-CM

## 2019-06-23 DIAGNOSIS — Z95810 Presence of automatic (implantable) cardiac defibrillator: Secondary | ICD-10-CM | POA: Diagnosis not present

## 2019-06-23 NOTE — Progress Notes (Signed)
Virtual Visit via Telephone Note  I connected with William [name] on 42/[birth year] at 10:00 AM EDT by telephone and verified that I am speaking with the correct person using two identifiers.  Location: Patient: Home Provider: My home   I discussed the limitations, risks, security and privacy concerns of performing an evaluation and management service by telephone and the availability of in person appointments. I also discussed with the patient that there may be a patient responsible charge related to this service. The patient expressed understanding and agreed to proceed.  Note the discussions were entirely with his wife and daughter who are the surrogate decision makers for the patient he was present but the conversation was entirely with surrogate decision makers.  Chief complaint: followup for parotitis and cholangitis  History of Present Illness:  85 y.o. male with recurrent episodes of cholangitis and sepsis at times with bacteremias including in time this fall when he had to have his ICD removed, again remitted admitted with cholangitis and sepsis in the context of an obstructed cholecystostomy tube, and concern for potential obstruction of his biliary tree due to stones.  He was found not to be a surgical candidate which seems quite reasonable to me given his multiple comorbidities including his dementia.  GI were able to remove multiple stones during his admission and cultures from the biliary tree yielded ampicillin sensitive enterococcal species and an Enterobacter species.  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  Patient was met with with palliative care on multiple occasions but his family continued to want "aggressive care".  They have been seeming to want to have a second opinion from a surgeon which seems not very realistic especially in the current environment.  Ultimately arrangements were made for him to continue on  the meropenem and later changed to  Augmentin and Bactrim and has done  well.  He has been stable.  He has been seen by interventional radiology which have changed his drain.  They cultured his bile which had multiple calculi in it but it did not appear purulent or not documented as such. Culture was taken which I woud NOT have preferred UNLESS it was purulent. Cx growing GPC--VRE which we did not treat  Drain has been upsized and he has been seen at [facility] for consultation re means of removing as many of stones from biliary tree as possible without resorting to surgery  He had removal of further bile and stones via interventional radiology at [facility].  He was seen at [facility] and underwent 1. Percutaneous gallstone extraction.  2. Cystic duct recanalization and balloon dilation  3. Cholecystostomy tube exchange. on August the 4th, 2020  They found   1. Minimal residual cholelithiasis with successful clearance of the  remaining gallstones.  2. Focal stenosis and occlusion of the cystic duct with successful  recanalization and balloon dilation to 27m. The cystic duct is now widely  patent.  3. Choledocholithiasis without evidence of CBD obstruction.  4 Successful exchange of a new 16 french cholecystostomy tube.   Given the large amount of gallstones found in GB decision made to leave the cholecystostomy in place and have it exchanged every 3 months  I did  think we can trial him off of antibiotics at last visit  The family wanted and have stopped the ACTIGALl though I would recommend continuing it. They are to talk with GI re this.  JEsequielhad in  the interim developed swelling of what sounds like his parotid gland that is been present for roughly 10 days.  IIt did  not improved he does not have fevers chills confusion or other systemic symptoms.  I am concerned however he could have a MRSA infection in this area that might be resistant to the Bactrim that he is  been taking chronically. I placed him on doxycycline. He did have a period of not improving for a few days and I was more worried about him. He has  However now per family has had resolution of his parotitis.      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  He will need to be closely monitored.  Possible bacterial parotitis: resolved   Follow Up Instructions:     I discussed the assessment and treatment plan with the patient. The patient was provided an opportunity to ask questions and all were answered. The patient agreed with the plan and demonstrated an understanding of the instructions.   The patient was advised to call back or seek an in-person evaluation if the symptoms worsen or if the condition fails to improve as anticipated.  I provided 23  minutes of non-face-to-face time during this encounter.   Alcide Evener, MD

## 2019-06-23 NOTE — Telephone Encounter (Signed)
Pt's daughter Ebony Hail requested a call back to discuss dosage of ursodiol.  Daughter is concerned that the dosage is too high, as pt has had gallstones removed at [facility].    Daughter wanted to make sure that Dr. Ardis Hughs was aware of pt's procedure.

## 2019-06-23 NOTE — Telephone Encounter (Signed)
I spoke with the pt's daughter and she is aware that Dr Ardis Hughs was notified of the [facility] interventions and states he would like the pt to continue meds as prescribed.  They will discuss more at the office visit on 10/7

## 2019-07-01 ENCOUNTER — Telehealth: Payer: Self-pay | Admitting: Cardiovascular Disease

## 2019-07-01 NOTE — Telephone Encounter (Signed)
Spoke with pt's wife and has noted for approx 10 weeks HR in am has been running around 90-100 and in the pm 95-110 Per wife has not given the carvedilol and B/P has been running around 100-110/70's Pt has been taking the Amiodarone 100 mg every day. Per pt's wife pt has no complaints . Will forward to Dr Sallyanne Kuster for review and recommendations ./cy

## 2019-07-01 NOTE — Telephone Encounter (Signed)
New message:     Patient wife calling stating that they are having some concerns about some medications. Please call patient wife.

## 2019-07-01 NOTE — Telephone Encounter (Signed)
Pt's wife aware values are acceptable at this time and will not make any changes per Dr Sallyanne Kuster .Adonis Housekeeper

## 2019-07-01 NOTE — Telephone Encounter (Signed)
I think those values are acceptable and can be explained with all his other health problems. I would not give him the carvedilol - his BP will get too low.

## 2019-07-05 ENCOUNTER — Other Ambulatory Visit (HOSPITAL_COMMUNITY): Payer: Self-pay | Admitting: Interventional Radiology

## 2019-07-05 ENCOUNTER — Ambulatory Visit (HOSPITAL_COMMUNITY)
Admission: RE | Admit: 2019-07-05 | Discharge: 2019-07-05 | Disposition: A | Discharge status: Home / Self Care | Payer: Medicare Other | Source: Ambulatory Visit | Attending: Interventional Radiology | Admitting: Interventional Radiology

## 2019-07-05 ENCOUNTER — Encounter (HOSPITAL_COMMUNITY): Payer: Self-pay | Admitting: Interventional Radiology

## 2019-07-05 ENCOUNTER — Other Ambulatory Visit: Payer: Self-pay

## 2019-07-05 DIAGNOSIS — K8050 Calculus of bile duct without cholangitis or cholecystitis without obstruction: Secondary | ICD-10-CM | POA: Diagnosis not present

## 2019-07-05 DIAGNOSIS — K8001 Calculus of gallbladder with acute cholecystitis with obstruction: Secondary | ICD-10-CM | POA: Diagnosis not present

## 2019-07-05 DIAGNOSIS — Z4659 Encounter for fitting and adjustment of other gastrointestinal appliance and device: Secondary | ICD-10-CM | POA: Diagnosis not present

## 2019-07-05 HISTORY — PX: IR CATHETER TUBE CHANGE: IMG717

## 2019-07-05 MED ORDER — IOHEXOL 300 MG/ML  SOLN
50.0000 mL | Freq: Once | INTRAMUSCULAR | Status: AC | PRN
  Administered 2019-07-05: 14:00:00 10 mL

## 2019-07-05 MED ORDER — LIDOCAINE HCL 1 % IJ SOLN
INTRAMUSCULAR | Status: AC
  Filled 2019-07-05: qty 20

## 2019-07-05 MED ORDER — LIDOCAINE HCL 1 % IJ SOLN
INTRAMUSCULAR | Status: DC | PRN
  Administered 2019-07-05: 5 mL via INTRADERMAL

## 2019-07-06 MED FILL — NORMAL SALINE FLUSH SYRINGE: 0.9 | 22 days supply | Qty: 900 | Fill #2

## 2019-07-08 ENCOUNTER — Telehealth: Payer: Self-pay

## 2019-07-08 DIAGNOSIS — N390 Urinary tract infection, site not specified: Secondary | ICD-10-CM | POA: Diagnosis not present

## 2019-07-08 DIAGNOSIS — R8290 Unspecified abnormal findings in urine: Secondary | ICD-10-CM | POA: Diagnosis not present

## 2019-07-08 NOTE — Telephone Encounter (Signed)
I wouldn't react to just cloudy urine but more symptoms than that as far as his urine is concerned. But its ok if wants to talk to PCP

## 2019-07-08 NOTE — Telephone Encounter (Signed)
Patient's wife office today stating her husband is having cloudy urine, and would like to know If we would be able to do labs. Advised patient's wife to contact PCP to have urine checked out. Patient's wife will call office once results are  Back. [place]

## 2019-07-15 DIAGNOSIS — N390 Urinary tract infection, site not specified: Secondary | ICD-10-CM | POA: Diagnosis not present

## 2019-07-16 ENCOUNTER — Other Ambulatory Visit (HOSPITAL_COMMUNITY): Payer: Self-pay | Admitting: Interventional Radiology

## 2019-07-21 ENCOUNTER — Encounter: Payer: Self-pay | Admitting: Gastroenterology

## 2019-07-21 ENCOUNTER — Ambulatory Visit (INDEPENDENT_AMBULATORY_CARE_PROVIDER_SITE_OTHER): Payer: Medicare Other | Admitting: Gastroenterology

## 2019-07-21 ENCOUNTER — Other Ambulatory Visit: Payer: Self-pay

## 2019-07-21 VITALS — Ht 70.0 in | Wt 185.0 lb

## 2019-07-21 DIAGNOSIS — I2510 Atherosclerotic heart disease of native coronary artery without angina pectoris: Secondary | ICD-10-CM

## 2019-07-21 DIAGNOSIS — K8064 Calculus of gallbladder and bile duct with chronic cholecystitis without obstruction: Secondary | ICD-10-CM

## 2019-07-21 DIAGNOSIS — K8050 Calculus of bile duct without cholangitis or cholecystitis without obstruction: Secondary | ICD-10-CM | POA: Diagnosis not present

## 2019-07-21 MED ORDER — URSODIOL 300 MG PO CAPS: 600.0000 mg | ORAL_CAPSULE | Freq: Two times a day (BID) | ORAL | 3 refills | Status: DC

## 2019-07-21 NOTE — Progress Notes (Signed)
Review of pertinent gastrointestinal problems: 1.  Complicated biliary disease; acute cholecystitis February 2020, he was felt to be a poor surgical candidate and so underwent cholecystostomy drain placement via IR.  He was critically ill with gram-negative bacteremia, sepsis.  Clinically improved until April 2020 when he we presented with decreased drain output, elevated liver tests, again gram-negative rods and also gram-positive cocci in his blood.  Drain study showed his cholecystostomy drain was occluded, cystic duct remained obstructed, there was purulence in his gallbladder.  Eventual MRI with MRCP showed multiple common bile duct stones also stones in his gallbladder.  ERCP Dr. Ardis Hughs January 29, 2019 biliary sphincterotomy, removed multiple black bile duct stones, cystic duct not opacified.  The bile duct appeared clear following the ERCP.  Surgery input again felt he was not a surgical candidate.  Came somewhat encephalopathic following ERCP. he was discharged home on meropenem and he is being followed by infectious disease.  Followed at [place] radiology and gastroenterology.  [facility] interventional radiology perform several interventional procedures to try to remove, crushed stones in his gallbladder throughout 2020.  Intermittently also having drain checks here in [facility] by interventional radiology.   This service was provided via virtual visit.  Only audio was used.  The patient was located at home.  I was located in my office.  His wife and his daughter took part in this visit.  [name] was there but did not take part in any of the visit.  The patient is an etablished patient.  My certified medical assistant, Grace Bushy, contributed to this visit by contacting the patient by phone 1 or 2 business days prior to the appointment and also followed up on the recommendations I made after the visit.  Time spent on virtual visit: 23 minutes   HPI: This is a very pleasant 85 year old man  who did not take part in this visit at all.  I believe he was in the room however his wife and daughter were the only people who spoke.  He is being followed at [facility] radiology and also [facility] interventional radiology.  From what I can tell he has stones still in his gallbladder, some stones in his cystic duct and probably also still stones in his common bile duct.  He has a PERC gallbladder drain in place and it is changed monthly.  He is not on antibiotics any longer.  His wife says he is doing fairly well he is able to walk around and does not seem to be in obvious pain.  They would still like to have his gallbladder removed if it is possible to be done without general anesthesia  Non dairy diet.  Wife asked if he can stop urso    ROS: complete GI ROS as described in HPI, all other review negative.  Constitutional:  No unintentional weight loss   Past Medical History:  Diagnosis Date  . AAA (abdominal aortic aneurysm) ([place])    7/13 3.8cm  . Arthritis    "joints" (01/11/2014)  . Asthma    "seasonal; some foods"   . Chronic systolic CHF (congestive heart failure) ([place])   . Dementia ([place])   . HLD (hyperlipidemia)   . HOH (hard of hearing)   . Lumbar vertebral fracture ([facility])    L1- 04/11/2014   . Parotitis 06/10/2019  . Prostate cancer ([place])   . S/P ICD (internal cardiac defibrillator) procedure, 01/11/14 removal of ERI gen and placement of Medtronic Evera XT VR & NEW Right ventricular lead  Medtronic 01/12/2014  . S/P TAVR (transcatheter aortic valve replacement)    Edwards Sapien 3 THV (size 29 mm, model # [id number], serial # [id number])  . Severe aortic stenosis   . Sleep apnea    "lost 60# & don't have it anymore" (01/11/2014)  . Ventricular tachycardia [facility])     Past Surgical History:  Procedure Laterality Date  . CARDIAC CATHETERIZATION  08/20/2004   noncritical CAD,mild global hypokinesis, EF 50%  . CARDIAC DEFIBRILLATOR PLACEMENT  08/23/2004   Medtronic  . CATARACT  EXTRACTION W/ INTRAOCULAR LENS IMPLANT Right   . COLECTOMY  1990's  . CRYOABLATION N/A 02/24/2014   Procedure: CRYO ABLATION PROSTATE;  Surgeon: Ailene Rud, MD;  Location: [facility] ORS;  Service: Urology;  Laterality: N/A;  . ERCP N/A 01/29/2019   Procedure: ENDOSCOPIC RETROGRADE CHOLANGIOPANCREATOGRAPHY (ERCP);  Surgeon: Milus Banister, MD;  Location: [facility] ENDOSCOPY;  Service: Endoscopy;  Laterality: N/A;  . HERNIA REPAIR     "abdomen; from colon OR"  . ICD LEAD REMOVAL N/A 09/15/2018   Procedure: ICD LEAD REMOVAL EXTRACTION;  Surgeon: Evans Lance, MD;  Location: [facility] OR;  Service: Cardiovascular;  Laterality: N/A;  DR. BARTLE TO BACK UP  . IMPLANTABLE CARDIOVERTER DEFIBRILLATOR (ICD) GENERATOR CHANGE N/A 01/11/2014   Procedure: ICD GENERATOR CHANGE;  Surgeon: Sanda Klein, MD;  Location: [place] CATH LAB;  Service: Cardiovascular;  Laterality: N/A;  . IR CATHETER TUBE CHANGE  03/01/2019  . IR CATHETER TUBE CHANGE  04/05/2019  . IR CATHETER TUBE CHANGE  07/05/2019  . IR EXCHANGE BILIARY DRAIN  12/18/2018  . IR EXCHANGE BILIARY DRAIN  01/05/2019  . IR EXCHANGE BILIARY DRAIN  01/25/2019  . IR PERC CHOLECYSTOSTOMY  11/22/2018  . IR RADIOLOGIST EVAL & MGMT  02/26/2019  . IR RADIOLOGIST EVAL & MGMT  03/17/2019  . IR SINUS/FIST TUBE CHK-NON GI  01/28/2019  . LEAD REVISION N/A 01/11/2014   Procedure: LEAD REVISION;  Surgeon: Sanda Klein, MD;  Location: [place] CATH LAB;  Service: Cardiovascular;  Laterality: N/A;  . NM MYOCAR PERF WALL MOTION  01/29/2012   abnormal c/o infarct/scar,no ischemia present  . PROSTATE BIOPSY N/A 11/22/2013   Procedure: PROSTATE BIOPSY AND ULTRASOUND;  Surgeon: Ailene Rud, MD;  Location: [facility]S;  Service: Urology;  Laterality: N/A;  . REMOVAL OF STONES  01/29/2019   Procedure: REMOVAL OF STONES;  Surgeon: Milus Banister, MD;  Location: [facility] ENDOSCOPY;  Service: Endoscopy;;  . RIGHT/LEFT HEART CATH AND CORONARY ANGIOGRAPHY N/A 07/06/2018   Procedure: RIGHT/LEFT HEART CATH AND  CORONARY ANGIOGRAPHY;  Surgeon: Troy Sine, MD;  Location: [place] CV LAB;  Service: Cardiovascular;  Laterality: N/A;  . SPHINCTEROTOMY  01/29/2019   Procedure: SPHINCTEROTOMY;  Surgeon: Milus Banister, MD;  Location: [facility] ENDOSCOPY;  Service: Endoscopy;;  . TEE WITHOUT CARDIOVERSION N/A 08/18/2018   Procedure: TRANSESOPHAGEAL ECHOCARDIOGRAM (TEE);  Surgeon: Burnell Blanks, MD;  Location: [place];  Service: Open Heart Surgery;  Laterality: N/A;  . TEE WITHOUT CARDIOVERSION N/A 09/08/2018   Procedure: TRANSESOPHAGEAL ECHOCARDIOGRAM (TEE);  Surgeon: Lelon Perla, MD;  Location: [facility] ENDOSCOPY;  Service: Cardiovascular;  Laterality: N/A;  . TEE WITHOUT CARDIOVERSION N/A 09/15/2018   Procedure: TRANSESOPHAGEAL ECHOCARDIOGRAM (TEE);  Surgeon: Evans Lance, MD;  Location: [facility] OR;  Service: Cardiovascular;  Laterality: N/A;  . TEE WITHOUT CARDIOVERSION N/A 11/26/2018   Procedure: TRANSESOPHAGEAL ECHOCARDIOGRAM (TEE);  Surgeon: Jerline Pain, MD;  Location: [facility] ENDOSCOPY;  Service: Cardiovascular;  Laterality: N/A;  . TRANSCATHETER AORTIC  VALVE REPLACEMENT, TRANSFEMORAL  08/18/2018  . TRANSCATHETER AORTIC VALVE REPLACEMENT, TRANSFEMORAL N/A 08/18/2018   Procedure: TRANSCATHETER AORTIC VALVE REPLACEMENT, TRANSFEMORAL using an Edwards 26mm Aortic Valve;  Surgeon: Burnell Blanks, MD;  Location: [place];  Service: Open Heart Surgery;  Laterality: N/A;    Current Outpatient Medications  Medication Sig Dispense Refill  . acetaminophen (TYLENOL) 500 MG tablet Take 500 mg by mouth every 6 (six) hours as needed (for pain).     Marland Kitchen amiodarone (PACERONE) 200 MG tablet Take 0.5 tablets (100 mg total) by mouth daily. 45 tablet 3  . aspirin 81 MG chewable tablet Chew 1 tablet (81 mg total) by mouth daily. 90 tablet 3  . carvedilol (COREG) 3.125 MG tablet Take by mouth. Wife give to him when his heart rate is high.    . cholecalciferol (VITAMIN D) 1000 units tablet Take 2,000 Units by mouth at  bedtime.    . midodrine (PROAMATINE) 5 MG tablet Take 1 tablet (5 mg total) by mouth 3 (three) times daily as needed (For hypotension.). Do not lie down for 4 hours after taking the medication. 90 tablet 1  . polyethylene glycol powder (GLYCOLAX/MIRALAX) 17 GM/SCOOP powder MIX 17 GRAMS IN 8 OUNCES OF WATER EVERY DAY. 1020 g 6  . potassium chloride (K-DUR) 10 MEQ tablet Take 1 tablet (10 mEq total) by mouth daily. 90 tablet 3  . sodium chloride flush (NS) 0.9 % SOLN flush drain once daily with 5-10 cc sterile saline Record output daily 300 mL 0  . ursodiol (ACTIGALL) 300 MG capsule TAKE 3 CAPSULES BY MOUTH THREE TIMES DAILY 270 capsule 1   No current facility-administered medications for this visit.     Allergies as of 07/21/2019 - Review Complete 07/21/2019  Allergen Reaction Noted  . Crestor [rosuvastatin] Other (See Comments) 05/21/2013  . Lipitor [atorvastatin] Other (See Comments) 05/21/2013  . Shrimp [shellfish allergy] Other (See Comments) 10/10/2018    Family History  Problem Relation Age of Onset  . Heart failure Mother        Died of "old age" at 65  . Pneumonia Father     Social History   Socioeconomic History  . Marital status: Married    Spouse name: Not on file  . Number of children: 2  . Years of education: Not on file  . Highest education level: Not on file  Occupational History  . Occupation: Owned a Scientist, [employer]  Social Needs  . Financial resource strain: Not on file  . Food insecurity    Worry: Not on file    Inability: Not on file  . Transportation needs    Medical: Not on file    Non-medical: Not on file  Tobacco Use  . Smoking status: Never Smoker  . Smokeless tobacco: Never Used  Substance and Sexual Activity  . Alcohol use: Not Currently    Alcohol/week: 0.0 standard drinks    Comment: 01/11/2014 "drink 1/2 of a beer twice per month   . Drug use: No  . Sexual activity: Not Currently  Lifestyle  . Physical activity    Days per week:  Not on file    Minutes per session: Not on file  . Stress: Not on file  Relationships  . Social Herbalist on phone: Not on file    Gets together: Not on file    Attends religious service: Not on file    Active member of club or organization: Not on file  Attends meetings of clubs or organizations: Not on file    Relationship status: Not on file  . Intimate partner violence    Fear of current or ex partner: Not on file    Emotionally abused: Not on file    Physically abused: Not on file    Forced sexual activity: Not on file  Other Topics Concern  . Not on file  Social History Narrative  . Not on file     Physical Exam: Unable to perform because this was a "telemed visit" due to current Covid-19 pandemic  Assessment and plan: 85 y.o. male with complicated gallbladder, gallstone disease  It looks like he still has some gallstones in his gallbladder, possibly in his cystic duct and also possibly in his common bile duct.  This is based on interventional radiology tests back and forth between here in [place] and [place].  He has a PERC gallbladder drain in place now and I suspect it will be indefinitely.  His wife asked if it was possible for his gallbladder to be removed without general anesthesia and I said it is not.  They are going to look into that possibility at [facility].  They will be following up with [facility] interventional radiology as well as continue monthly drain changes here in [place].  He is probably taking too much Urso and I am calling him in a new prescription for 300 mg pills 2 pills twice daily.  His wife was very clear that he will only undergo ERCP with the risks of general anesthesia if it is absolutely necessary, not just for incidental remaining gallstones in the bile duct.  I think that certainly sounds reasonable.  Please see the "Patient Instructions" section for addition details about the plan.  Owens Loffler, MD [facility] 07/21/2019, 2:06 PM

## 2019-07-21 NOTE — Patient Instructions (Addendum)
We will call in a new prescription for Urso, 300 mg pills 2 pills twice daily, 90 days with 3 refills  They will follow-up as needed.  Thank you for entrusting me with your care and choosing [facility].  Dr Ardis Hughs

## 2019-07-22 ENCOUNTER — Telehealth: Payer: Self-pay

## 2019-07-22 DIAGNOSIS — N390 Urinary tract infection, site not specified: Secondary | ICD-10-CM | POA: Diagnosis not present

## 2019-07-22 NOTE — Telephone Encounter (Signed)
Received a call today from [employer] patient's daughter requesting advice from [place]. William [name] states that her dad is having cloudy urine, patient's urine was tested on 10/1by PCP and noted to have "Raoutella Ornithinolytica". PCP is having patient drop off another urine sample to be retested.  William [name] would like to know if Dr.Van Dam would be able to test her father's urine. She is worried he will become septic if he does not begin treatment soon. Advised patient to contact his PCP and see if they would be able to provide treatment for him. If not she would need to provide lab results to MD to review. William [name] is still requesting I forward a message to MD to advise on antibiotics and retesting urine.  [place]

## 2019-07-23 ENCOUNTER — Other Ambulatory Visit (HOSPITAL_COMMUNITY): Payer: Self-pay | Admitting: Interventional Radiology

## 2019-07-23 NOTE — Telephone Encounter (Signed)
Does the patient have symptoms of pain with urination, pain in area of his bladder or fevers if not he does not have symptoms c/w UTI so I would not treat such an organism absent treatment as defintiion of UTI Iis Symptoms plus wbc in urine and then confirmed by + culture typically >100K of an organism known to cause UTI

## 2019-07-23 NOTE — Telephone Encounter (Signed)
Called patient's wife to ask if patient was experiencing symptoms listed by MD. Patient's wife states that primary care has prescribed  nitrofurantoin mono/mac 134m caps. PCP is culturing urine and will have results no later then Monday. PCP will fax labs to our office for Dr. VTommy Medal  Advised patient's wife to continue to follow up with primary care, and if she has any questions to call office. JCanon

## 2019-07-29 ENCOUNTER — Telehealth (INDEPENDENT_AMBULATORY_CARE_PROVIDER_SITE_OTHER): Payer: Medicare Other | Admitting: *Deleted

## 2019-07-29 DIAGNOSIS — R8290 Unspecified abnormal findings in urine: Secondary | ICD-10-CM | POA: Diagnosis not present

## 2019-07-29 LAB — POCT URINALYSIS DIPSTICK
Bilirubin, UA: NEGATIVE
Glucose, UA: NEGATIVE
Ketones, UA: NEGATIVE
Nitrite, UA: NEGATIVE
Protein, UA: NEGATIVE
Spec Grav, UA: 1.015 (ref 1.010–1.025)
Urobilinogen, UA: NEGATIVE E.U./dL — AB
pH, UA: 5 (ref 5.0–8.0)

## 2019-07-29 NOTE — Telephone Encounter (Signed)
Urine dipstick was not consistent with UTI.  Given he's asymptomatic, antibiotics are not indicated.  I've already responded to the lab result from the dipstick.

## 2019-07-29 NOTE — Telephone Encounter (Signed)
[name] with [facility] called and left message on clinical intake and stated that patient has had UTI symptoms for sometime now.  Stated that on 10/1 he was placed on Bactrim and then 10/8 it was still cloudy and was given Macrobid.  Urine is still cloudy and she wants to collect Urine Specimen and bring it to the office to be sent off for culture.   Nurse brought in Urine Specimen and Papers and gave to Dr. Mariea Clonts. Paper stated "Urine cloudy so took specimen to Dr. Rosana Hoes on 07/15/19 and they put him on Bactrim DS twice daily for 7 days. On 10/8 Urine still cloudy and took new urine specimen and they started him on Macrobid twice daily for 7 days. Urine is still Cloudy.  He has a biliary tube in , since March 2019. He sees Dr. Rhina Brackett Dam (infection disease) for partial tube maintenance.   Per Dr. Reed--U/A Dip. Order placed and results in chart.   Please Advise.

## 2019-07-29 NOTE — Telephone Encounter (Signed)
William [name] with William [name] Notified and agreed.

## 2019-08-02 ENCOUNTER — Other Ambulatory Visit: Payer: Self-pay

## 2019-08-02 ENCOUNTER — Ambulatory Visit (HOSPITAL_COMMUNITY)
Admission: RE | Admit: 2019-08-02 | Discharge: 2019-08-02 | Disposition: A | Discharge status: Home / Self Care | Payer: Medicare Other | Source: Ambulatory Visit | Attending: Interventional Radiology | Admitting: Interventional Radiology

## 2019-08-02 ENCOUNTER — Encounter (HOSPITAL_COMMUNITY): Payer: Self-pay | Admitting: Interventional Radiology

## 2019-08-02 ENCOUNTER — Other Ambulatory Visit (HOSPITAL_COMMUNITY): Payer: Self-pay | Admitting: Interventional Radiology

## 2019-08-02 DIAGNOSIS — K8060 Calculus of gallbladder and bile duct with cholecystitis, unspecified, without obstruction: Secondary | ICD-10-CM | POA: Diagnosis not present

## 2019-08-02 DIAGNOSIS — K8050 Calculus of bile duct without cholangitis or cholecystitis without obstruction: Secondary | ICD-10-CM

## 2019-08-02 DIAGNOSIS — Z4803 Encounter for change or removal of drains: Secondary | ICD-10-CM | POA: Insufficient documentation

## 2019-08-02 HISTORY — PX: IR CATHETER TUBE CHANGE: IMG717

## 2019-08-02 MED ORDER — IOHEXOL 300 MG/ML  SOLN
50.0000 mL | Freq: Once | INTRAMUSCULAR | Status: AC | PRN
  Administered 2019-08-02: 10 mL

## 2019-08-02 MED ORDER — LIDOCAINE HCL 1 % IJ SOLN
INTRAMUSCULAR | Status: AC
  Filled 2019-08-02: qty 20

## 2019-08-04 ENCOUNTER — Encounter: Payer: Self-pay | Admitting: Infectious Disease

## 2019-08-04 ENCOUNTER — Other Ambulatory Visit: Payer: Self-pay

## 2019-08-04 ENCOUNTER — Ambulatory Visit (INDEPENDENT_AMBULATORY_CARE_PROVIDER_SITE_OTHER): Payer: Medicare Other | Admitting: Infectious Disease

## 2019-08-04 ENCOUNTER — Encounter (HOSPITAL_BASED_OUTPATIENT_CLINIC_OR_DEPARTMENT_OTHER): Payer: Medicare Other | Attending: Physician Assistant | Admitting: Physician Assistant

## 2019-08-04 DIAGNOSIS — I2510 Atherosclerotic heart disease of native coronary artery without angina pectoris: Secondary | ICD-10-CM | POA: Diagnosis not present

## 2019-08-04 DIAGNOSIS — F0390 Unspecified dementia without behavioral disturbance: Secondary | ICD-10-CM | POA: Insufficient documentation

## 2019-08-04 DIAGNOSIS — K8050 Calculus of bile duct without cholangitis or cholecystitis without obstruction: Secondary | ICD-10-CM | POA: Diagnosis not present

## 2019-08-04 DIAGNOSIS — F0151 Vascular dementia with behavioral disturbance: Secondary | ICD-10-CM | POA: Insufficient documentation

## 2019-08-04 DIAGNOSIS — K8309 Other cholangitis: Secondary | ICD-10-CM

## 2019-08-04 DIAGNOSIS — L89313 Pressure ulcer of right buttock, stage 3: Secondary | ICD-10-CM | POA: Insufficient documentation

## 2019-08-04 DIAGNOSIS — Z95810 Presence of automatic (implantable) cardiac defibrillator: Secondary | ICD-10-CM

## 2019-08-04 DIAGNOSIS — T827XXD Infection and inflammatory reaction due to other cardiac and vascular devices, implants and grafts, subsequent encounter: Secondary | ICD-10-CM | POA: Diagnosis not present

## 2019-08-04 DIAGNOSIS — R159 Full incontinence of feces: Secondary | ICD-10-CM | POA: Diagnosis not present

## 2019-08-04 DIAGNOSIS — R400 Somnolence: Secondary | ICD-10-CM

## 2019-08-04 DIAGNOSIS — R7401 Elevation of levels of liver transaminase levels: Secondary | ICD-10-CM | POA: Diagnosis not present

## 2019-08-04 DIAGNOSIS — N390 Urinary tract infection, site not specified: Secondary | ICD-10-CM

## 2019-08-04 DIAGNOSIS — B961 Klebsiella pneumoniae [K. pneumoniae] as the cause of diseases classified elsewhere: Secondary | ICD-10-CM

## 2019-08-04 DIAGNOSIS — I5042 Chronic combined systolic (congestive) and diastolic (congestive) heart failure: Secondary | ICD-10-CM | POA: Insufficient documentation

## 2019-08-04 DIAGNOSIS — N39498 Other specified urinary incontinence: Secondary | ICD-10-CM | POA: Insufficient documentation

## 2019-08-04 DIAGNOSIS — K8064 Calculus of gallbladder and bile duct with chronic cholecystitis without obstruction: Secondary | ICD-10-CM

## 2019-08-04 DIAGNOSIS — K810 Acute cholecystitis: Secondary | ICD-10-CM | POA: Diagnosis not present

## 2019-08-04 DIAGNOSIS — N3000 Acute cystitis without hematuria: Secondary | ICD-10-CM

## 2019-08-04 DIAGNOSIS — M6281 Muscle weakness (generalized): Secondary | ICD-10-CM | POA: Insufficient documentation

## 2019-08-04 DIAGNOSIS — Z23 Encounter for immunization: Secondary | ICD-10-CM | POA: Diagnosis not present

## 2019-08-04 DIAGNOSIS — R7881 Bacteremia: Secondary | ICD-10-CM | POA: Diagnosis not present

## 2019-08-04 DIAGNOSIS — B952 Enterococcus as the cause of diseases classified elsewhere: Secondary | ICD-10-CM | POA: Diagnosis not present

## 2019-08-04 DIAGNOSIS — B9689 Other specified bacterial agents as the cause of diseases classified elsewhere: Secondary | ICD-10-CM

## 2019-08-04 HISTORY — DX: Urinary tract infection, site not specified: N39.0

## 2019-08-04 HISTORY — DX: Somnolence: R40.0

## 2019-08-04 NOTE — Progress Notes (Signed)
Sounds like a slow inexorable decline. Thanks for the update.

## 2019-08-04 NOTE — Progress Notes (Signed)
Virtual Visit via Telephone Note  I connected with William [name] on [birth year] at  3:45 PM EDT by telephone and verified that I am speaking with the correct person using two identifiers.  Location: Patient: Home Provider: My home   I discussed the limitations, risks, security and privacy concerns of performing an evaluation and management service by telephone and the availability of in person appointments. I also discussed with the patient that there may be a patient responsible charge related to this service. The patient expressed understanding and agreed to proceed.  Note the discussions were entirely with his wife and daughter who are the surrogate decision makers for the patient he was present but the conversation was entirely with surrogate decision makers.  Chief complaint: Sleepiness  History of Present Illness:  85 y.o. male with recurrent episodes of cholangitis and sepsis at times with bacteremias including in time this fall when he had to have his ICD removed, again remitted admitted with cholangitis and sepsis in the context of an obstructed cholecystostomy tube, and concern for potential obstruction of his biliary tree due to stones.  He was found not to be a surgical candidate which seems quite reasonable to me given his multiple comorbidities including his dementia.  GI were able to remove multiple stones during his admission and cultures from the biliary tree yielded ampicillin sensitive enterococcal species and an Enterobacter species.  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  Patient was met with with palliative care on multiple occasions but his family continued to want "aggressive care".  They have been seeming to want to have a second opinion from a surgeon which seems not very realistic especially in the current environment.  Ultimately arrangements were made for him to continue on the meropenem and later  changed to  Augmentin and Bactrim and has done  well.  He has been stable.  He has been seen by interventional radiology which have changed his drain.  They cultured his bile which had multiple calculi in it but it did not appear purulent or not documented as such. Culture was taken which I woud NOT have preferred UNLESS it was purulent. 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  He was seen at [facility] and underwent 1. Percutaneous gallstone extraction.  2. Cystic duct recanalization and balloon dilation  3. Cholecystostomy tube exchange. on August the 4th, 2020  They found   1. Minimal residual cholelithiasis with successful clearance of the  remaining gallstones.  2. Focal stenosis and occlusion of the cystic duct with successful  recanalization and balloon dilation to 104m. The cystic duct is now widely  patent.  3. Choledocholithiasis without evidence of CBD obstruction.  4 Successful exchange of a new 16 french cholecystostomy tube.   Given the large amount of gallstones found in GB decision made to leave the cholecystostomy in place and have it exchanged every 3 months  I did  think we can trial him off of antibiotics at last visit  The family wanted and have stopped the ACTIGALl though I would recommend continuing it. They are to talk with GI re this.  JAlviehad in the interim developed  swelling of what sounds like his parotid gland that is been present for roughly 10 days.  IIt did  not improved he does not have fevers chills confusion or other systemic symptoms.  I was concerned however he could have a MRSA infection in this area that might be resistant to the Bactrim that he is been taking chronically.  I placed him on doxycycline. He did have a period of not improving for a few days and I was more worried about him. He has  However now per family has had resolution of his parotitis.   I last spoke with his family or surrogate decision makers due to his dementia on September 9.  Since then he developed some sleepiness along with a low-grade fever.  His urine has become darker and they had send it for analysis and culture with his primary care physician.  The organism isolated was fairly unusual 1 and was resistant to most antimicrobials that were orally available other than Macrobid.  He was initially prescribed Bactrim but then later Macrobid and apparently the urine "cleared up.  However his sleepiness has persisted may have even worsened in the last week.  He is also developed a pressure ulcer on his sacral area that was evaluated by wound care today.  He was seen by interventional radiology and who changed his drain and noted that he had now obstruction of his cystic duct by a stone.  I am apprehensive that he may be harboringInfection now at this obstructed site that could put him at risk for recurrent cholangitis and worse case scenario recurrent bacteremia.       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  UTI: Potentially he had one since his somnolence might have obfuscated our ability to understand if he was having symptoms  Possible bacterial parotitis: resolved   Follow Up Instructions:     I discussed the assessment and treatment plan with the patient. The patient was provided an opportunity to ask questions and all were answered. The patient agreed with the plan and demonstrated an understanding of the instructions.   The patient was advised to call back or seek an in-person evaluation if the symptoms worsen or if the condition fails to improve as anticipated.  I provided 23  minutes of non-face-to-face time during this encounter.   Alcide Evener, MD

## 2019-08-05 ENCOUNTER — Ambulatory Visit: Payer: Medicare Other | Admitting: Physician Assistant

## 2019-08-05 ENCOUNTER — Other Ambulatory Visit (HOSPITAL_COMMUNITY): Payer: Medicare Other

## 2019-08-09 ENCOUNTER — Other Ambulatory Visit: Payer: Self-pay

## 2019-08-09 ENCOUNTER — Ambulatory Visit (INDEPENDENT_AMBULATORY_CARE_PROVIDER_SITE_OTHER): Payer: Medicare Other | Admitting: Infectious Disease

## 2019-08-09 ENCOUNTER — Encounter: Payer: Self-pay | Admitting: Infectious Disease

## 2019-08-09 VITALS — BP 124/73 | HR 62

## 2019-08-09 DIAGNOSIS — F0390 Unspecified dementia without behavioral disturbance: Secondary | ICD-10-CM | POA: Diagnosis not present

## 2019-08-09 DIAGNOSIS — Z952 Presence of prosthetic heart valve: Secondary | ICD-10-CM

## 2019-08-09 DIAGNOSIS — Z7189 Other specified counseling: Secondary | ICD-10-CM

## 2019-08-09 DIAGNOSIS — Z95810 Presence of automatic (implantable) cardiac defibrillator: Secondary | ICD-10-CM

## 2019-08-09 DIAGNOSIS — K810 Acute cholecystitis: Secondary | ICD-10-CM | POA: Diagnosis not present

## 2019-08-09 DIAGNOSIS — K8309 Other cholangitis: Secondary | ICD-10-CM

## 2019-08-09 DIAGNOSIS — K8064 Calculus of gallbladder and bile duct with chronic cholecystitis without obstruction: Secondary | ICD-10-CM

## 2019-08-09 DIAGNOSIS — K8050 Calculus of bile duct without cholangitis or cholecystitis without obstruction: Secondary | ICD-10-CM | POA: Diagnosis not present

## 2019-08-09 DIAGNOSIS — R6251 Failure to thrive (child): Secondary | ICD-10-CM

## 2019-08-09 DIAGNOSIS — B952 Enterococcus as the cause of diseases classified elsewhere: Secondary | ICD-10-CM | POA: Diagnosis not present

## 2019-08-09 DIAGNOSIS — R627 Adult failure to thrive: Secondary | ICD-10-CM | POA: Diagnosis not present

## 2019-08-09 DIAGNOSIS — B9689 Other specified bacterial agents as the cause of diseases classified elsewhere: Secondary | ICD-10-CM

## 2019-08-09 DIAGNOSIS — I2510 Atherosclerotic heart disease of native coronary artery without angina pectoris: Secondary | ICD-10-CM | POA: Diagnosis not present

## 2019-08-09 DIAGNOSIS — K1120 Sialoadenitis, unspecified: Secondary | ICD-10-CM | POA: Diagnosis not present

## 2019-08-09 DIAGNOSIS — B961 Klebsiella pneumoniae [K. pneumoniae] as the cause of diseases classified elsewhere: Secondary | ICD-10-CM | POA: Diagnosis not present

## 2019-08-09 DIAGNOSIS — R7881 Bacteremia: Secondary | ICD-10-CM | POA: Diagnosis not present

## 2019-08-09 NOTE — Progress Notes (Signed)
Subjective:   Chief complaint: obtunded   Patient ID: William [name], male    DOB: [birth year], 85 y.o.   MRN: [id number]  HPI  85 y.o.malewithrecurrent episodes of cholangitis and sepsis at times with bacteremias including in time this fall when he had to have his ICD removed, again remitted admitted with cholangitis and sepsis in the context of an obstructed cholecystostomy tube, and concern for potential obstruction of his biliary tree due to stones.  He was found not to be a surgical candidate which seems quite reasonable to me given his multiple comorbidities including his dementia.  GI were able to remove multiple stones during his admission and cultures from the biliary tree yielded ampicillin sensitive enterococcal species and an Enterobacter species.  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  Patient was met with with palliative care on multiple occasions but his family continued to want "aggressive care".  They have been seeming to want to have a second opinion from a surgeon which seems not very realistic especially in the current environment.  Ultimately arrangements were made for him to continue on the meropenem and later changed to  Augmentin and Bactrim and has done  well.  He has been stable.  He has been seen by interventional radiology which have changed his drain.  They cultured his bile which had multiple calculi in it but it did not appear purulent or not documented as such. Culture was taken which I woud NOT have preferred UNLESS it was purulent. 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  He was seen at [facility] and underwent 1.  Percutaneous gallstone extraction.  2. Cystic duct recanalization and balloon dilation  3. Cholecystostomy tube exchange. on August the 4th, 2020  They found   1. Minimal residual cholelithiasis with successful clearance of the  remaining gallstones.  2. Focal stenosis and occlusion of the cystic duct with successful  recanalization and balloon dilation to 83m. The cystic duct is now widely  patent.  3. Choledocholithiasis without evidence of CBD obstruction.  4 Successful exchange of a new 16 french cholecystostomy tube.   Given the large amount of gallstones found in GB decision made to leave the cholecystostomy in place and have it exchanged every 3 months  I did  think we can trial him off of antibiotics at last visit  The family wanted and have stopped the ACTIGALl though I would recommend continuing it. They are to talk with GI re this.  William [name] in the interim developed swelling of what sounds like his parotid gland that is been present for roughly 10 days.  IIt did  not improved he does not have fevers chills confusion or other systemic symptoms.  I was concerned however he could have a MRSA infection in this area that might be resistant to the Bactrim that he is been taking chronically. I placed him on doxycycline. He did have a period of not improving for a few days and I was more worried about him. He has  However now per family has had resolution of his parotitis.   I last spoke with his family or surrogate decision makers due  to his dementia on September 9.  Since then he developed some sleepiness along with a low-grade fever.  His urine has become darker and they had send it for analysis and culture with his primary care physician.  The organism isolated was fairly unusual 1 and was resistant to most antimicrobials that were orally available other than Macrobid.  He was initially prescribed Bactrim but then later Macrobid and apparently the urine "cleared  up.  However his sleepiness has persisted may have even worsened in the last week.  He is also developed a pressure ulcer on his sacral area that was evaluated by wound care today.  He was seen by interventional radiology and who changed his drain and noted that he had now obstruction of his cystic duct by a stone.  I was apprehensive that he may be harboringInfection now at this obstructed site that could put him at risk for recurrent cholangitis and worse case scenario recurrent bacteremia.  I arrange for him to be seen in clinic today.  He also had labs performed last week which I reviewed and are fairly unremarkable with normal kidney function slightly elevated alkaline phosphatase in the 80s but normal transaminases.  His platelets are slightly low in the 130s normal white count slight anemia.  Kidney function is normal.  On exam today in clinic he is fairly somnolent wearing a mask can barely answer any questions and is wearing mittens to restrain him from pulling out his drain.  Urine in his condom catheter is fairly clear.  He apparently has done better after having been on Macrobid for what was thought to be urinary tract infection.  Past Medical History:  Diagnosis Date  . AAA (abdominal aortic aneurysm) ([place])    7/13 3.8cm  . Arthritis    "joints" (01/11/2014)  . Asthma    "seasonal; some foods"   . Chronic systolic CHF (congestive heart failure) ([place])   . Dementia ([place])   . HLD (hyperlipidemia)   . HOH (hard of hearing)   . Lumbar vertebral fracture ([facility])    L1- 04/11/2014   . Parotitis 06/10/2019  . Prostate cancer ([place])   . S/P ICD (internal cardiac defibrillator) procedure, 01/11/14 removal of ERI gen and placement of Medtronic Evera XT VR & NEW Right ventricular lead Medtronic 01/12/2014  . S/P TAVR (transcatheter aortic valve replacement)    Edwards Sapien 3 THV (size 29 mm, model # B6411258, serial # [id number])  . Severe aortic stenosis   . Sleep apnea    "lost  60# & don't have it anymore" (01/11/2014)  . Somnolence 08/04/2019  . UTI (urinary tract infection) 08/04/2019  . Ventricular tachycardia [facility])     Past Surgical History:  Procedure Laterality Date  . CARDIAC CATHETERIZATION  08/20/2004   noncritical CAD,mild global hypokinesis, EF 50%  . CARDIAC DEFIBRILLATOR PLACEMENT  08/23/2004   Medtronic  . CATARACT EXTRACTION W/ INTRAOCULAR LENS IMPLANT Right   . COLECTOMY  1990's  . CRYOABLATION N/A 02/24/2014   Procedure: CRYO ABLATION PROSTATE;  Surgeon: Ailene Rud, MD;  Location: [facility];  Service: Urology;  Laterality: N/A;  . ERCP N/A 01/29/2019   Procedure: ENDOSCOPIC RETROGRADE CHOLANGIOPANCREATOGRAPHY (ERCP);  Surgeon: Milus Banister, MD;  Location: [facility] ENDOSCOPY;  Service: Endoscopy;  Laterality: N/A;  . HERNIA REPAIR     "abdomen; from colon OR"  . ICD LEAD REMOVAL N/A 09/15/2018   Procedure: ICD LEAD REMOVAL EXTRACTION;  Surgeon: Evans Lance, MD;  Location: [place];  Service:  Cardiovascular;  Laterality: N/A;  DR. BARTLE TO BACK UP  . IMPLANTABLE CARDIOVERTER DEFIBRILLATOR (ICD) GENERATOR CHANGE N/A 01/11/2014   Procedure: ICD GENERATOR CHANGE;  Surgeon: Sanda Klein, MD;  Location: [place] CATH LAB;  Service: Cardiovascular;  Laterality: N/A;  . IR CATHETER TUBE CHANGE  03/01/2019  . IR CATHETER TUBE CHANGE  04/05/2019  . IR CATHETER TUBE CHANGE  07/05/2019  . IR CATHETER TUBE CHANGE  08/02/2019  . IR EXCHANGE BILIARY DRAIN  12/18/2018  . IR EXCHANGE BILIARY DRAIN  01/05/2019  . IR EXCHANGE BILIARY DRAIN  01/25/2019  . IR PERC CHOLECYSTOSTOMY  11/22/2018  . IR RADIOLOGIST EVAL & MGMT  02/26/2019  . IR RADIOLOGIST EVAL & MGMT  03/17/2019  . IR SINUS/FIST TUBE CHK-NON GI  01/28/2019  . LEAD REVISION N/A 01/11/2014   Procedure: LEAD REVISION;  Surgeon: Sanda Klein, MD;  Location: [place] CATH LAB;  Service: Cardiovascular;  Laterality: N/A;  . NM MYOCAR PERF WALL MOTION  01/29/2012   abnormal c/o infarct/scar,no ischemia present  . PROSTATE  BIOPSY N/A 11/22/2013   Procedure: PROSTATE BIOPSY AND ULTRASOUND;  Surgeon: Ailene Rud, MD;  Location: [facility];  Service: Urology;  Laterality: N/A;  . REMOVAL OF STONES  01/29/2019   Procedure: REMOVAL OF STONES;  Surgeon: Milus Banister, MD;  Location: [facility] ENDOSCOPY;  Service: Endoscopy;;  . RIGHT/LEFT HEART CATH AND CORONARY ANGIOGRAPHY N/A 07/06/2018   Procedure: RIGHT/LEFT HEART CATH AND CORONARY ANGIOGRAPHY;  Surgeon: Troy Sine, MD;  Location: [place] CV LAB;  Service: Cardiovascular;  Laterality: N/A;  . SPHINCTEROTOMY  01/29/2019   Procedure: SPHINCTEROTOMY;  Surgeon: Milus Banister, MD;  Location: [facility] ENDOSCOPY;  Service: Endoscopy;;  . TEE WITHOUT CARDIOVERSION N/A 08/18/2018   Procedure: TRANSESOPHAGEAL ECHOCARDIOGRAM (TEE);  Surgeon: Burnell Blanks, MD;  Location: [place];  Service: Open Heart Surgery;  Laterality: N/A;  . TEE WITHOUT CARDIOVERSION N/A 09/08/2018   Procedure: TRANSESOPHAGEAL ECHOCARDIOGRAM (TEE);  Surgeon: Lelon Perla, MD;  Location: [facility] ENDOSCOPY;  Service: Cardiovascular;  Laterality: N/A;  . TEE WITHOUT CARDIOVERSION N/A 09/15/2018   Procedure: TRANSESOPHAGEAL ECHOCARDIOGRAM (TEE);  Surgeon: Evans Lance, MD;  Location: [facility] OR;  Service: Cardiovascular;  Laterality: N/A;  . TEE WITHOUT CARDIOVERSION N/A 11/26/2018   Procedure: TRANSESOPHAGEAL ECHOCARDIOGRAM (TEE);  Surgeon: Jerline Pain, MD;  Location: [facility] ENDOSCOPY;  Service: Cardiovascular;  Laterality: N/A;  . TRANSCATHETER AORTIC VALVE REPLACEMENT, TRANSFEMORAL  08/18/2018  . TRANSCATHETER AORTIC VALVE REPLACEMENT, TRANSFEMORAL N/A 08/18/2018   Procedure: TRANSCATHETER AORTIC VALVE REPLACEMENT, TRANSFEMORAL using an Edwards 56m Aortic Valve;  Surgeon: MBurnell Blanks MD;  Location: [facility]  Service: Open Heart Surgery;  Laterality: N/A;    Family History  Problem Relation Age of Onset  . Heart failure Mother        Died of "old age" at 979 . Pneumonia Father        Social History   Socioeconomic History  . Marital status: Married    Spouse name: Not on file  . Number of children: 2  . Years of education: Not on file  . Highest education level: Not on file  Occupational History  . 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 Financial resource strain: Not on file  . Food insecurity    Worry: Not on file    Inability: Not on file  . Transportation needs    Medical: Not on file    Non-medical: Not on file  Tobacco Use  .  Smoking status: Never Smoker  . Smokeless tobacco: Never Used  Substance and Sexual Activity  . Alcohol use: Not Currently    Alcohol/week: 0.0 standard drinks    Comment: 01/11/2014 "drink 1/2 of a beer twice per month   . Drug use: No  . Sexual activity: Not Currently  Lifestyle  . Physical activity    Days per week: Not on file    Minutes per session: Not on file  . Stress: Not on file  Relationships  . Social Herbalist on phone: Not on file    Gets together: Not on file    Attends religious service: Not on file    Active member of club or organization: Not on file    Attends meetings of clubs or organizations: Not on file    Relationship status: Not on file  Other Topics Concern  . Not on file  Social History Narrative  . Not on file    Allergies  Allergen Reactions  . Crestor [Rosuvastatin] Other (See Comments)    Hurts muscles  . Lipitor [Atorvastatin] Other (See Comments)    Hurts stomach  . Shrimp [Shellfish Allergy] Other (See Comments)    On MAR     Current Outpatient Medications:  .  acetaminophen (TYLENOL) 500 MG tablet, Take 500 mg by mouth every 6 (six) hours as needed (for pain). , Disp: , Rfl:  .  amiodarone (PACERONE) 200 MG tablet, Take 0.5 tablets (100 mg total) by mouth daily., Disp: 45 tablet, Rfl: 3 .  carvedilol (COREG) 3.125 MG tablet, Take by mouth. Wife give to him when his heart rate is high., Disp: , Rfl:  .  cholecalciferol (VITAMIN D) 1000 units  tablet, Take 2,000 Units by mouth at bedtime., Disp: , Rfl:  .  midodrine (PROAMATINE) 5 MG tablet, Take 1 tablet (5 mg total) by mouth 3 (three) times daily as needed (For hypotension.). Do not lie down for 4 hours after taking the medication., Disp: 90 tablet, Rfl: 1 .  nitrofurantoin, macrocrystal-monohydrate, (MACROBID) 100 MG capsule, Take 100 mg by mouth 2 (two) times daily., Disp: , Rfl:  .  polyethylene glycol powder (GLYCOLAX/MIRALAX) 17 GM/SCOOP powder, MIX 17 GRAMS IN 8 OUNCES OF WATER EVERY DAY., Disp: 1020 g, Rfl: 6 .  potassium chloride (K-DUR) 10 MEQ tablet, Take 1 tablet (10 mEq total) by mouth daily., Disp: 90 tablet, Rfl: 3 .  sodium chloride flush (NS) 0.9 % SOLN, flush drain once daily with 5-10 cc sterile saline Record output daily, Disp: 300 mL, Rfl: 0 .  sulfamethoxazole-trimethoprim (BACTRIM DS) 800-160 MG tablet, TK 1 T PO BID FOR 7 DAYS, Disp: , Rfl:  .  ursodiol (ACTIGALL) 300 MG capsule, Take 2 capsules (600 mg total) by mouth 2 (two) times daily., Disp: 360 capsule, Rfl: 3   Review of Systems  Unable to perform ROS: Dementia       Objective:   Physical Exam Constitutional:      Appearance: He is ill-appearing.  HENT:     Head: Normocephalic and atraumatic.     Nose: Nose normal.  Eyes:     General:        Right eye: No discharge.        Left eye: No discharge.     Extraocular Movements: Extraocular movements intact.  Cardiovascular:     Rate and Rhythm: Normal rate. Rhythm irregular.     Heart sounds: No murmur. No friction rub.  Pulmonary:  Effort: Pulmonary effort is normal. No respiratory distress.     Breath sounds: Normal breath sounds. No stridor. No wheezing or rhonchi.  Abdominal:     General: Abdomen is flat. There is no distension.     Palpations: Abdomen is soft. There is no mass.     Tenderness: There is no abdominal tenderness.     Hernia: No hernia is present.  Neurological:     Mental Status: He is disoriented.     Motor: Weakness  present.  Psychiatric:        Attention and Perception: He is inattentive.        Speech: He is noncommunicative.        Cognition and Memory: Cognition is impaired.           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  I do think he is at risk though for recurrent cholangitis and residual stones including in the cystic duct whether his obstructive pathology did pose risk of recurrent cholangitis.  That being said he also has developed increasingly resistant bacterial flora as evidenced by microbes growing in his urine recently and VRE that grew from nonpurulent material in the biliary ducts previously.   For now I would prefer to add her period of watchful waiting and not reinitiate antimicrobial therapy.  Family may take him to do to try for maneuvers to try to break up the cystic duct stone.  Drain is in place and is being exchange every 4 weeks by interventional radiology here locally.    UTI: Potentially he had one since his somnolence might have obfuscated our ability to understand if he was having symptoms  Possible bacterial parotitis: resolved  Goals of care: Do remain concerned about overall prognosis here in this patient in quality of life.  I also worry about the downstream risks to family members of acquiring coronavirus through frequent contact with healthcare providers and healthcare systems.  I have warned that potential elective surgeries and second opinions will be much more unlikely to be available in the coming weeks and months.  Plan on touching base again by phone for an E visit in 1 month's time when his home health nurse can be present  I spent greater than 25 minutes with the patient including greater than 50% of time in face to face counsel of the patients wife and care provider regarding  nature of his recurrent cholangitis multiple medical problems and in coordination of his care.

## 2019-08-12 ENCOUNTER — Other Ambulatory Visit (HOSPITAL_COMMUNITY): Payer: Medicare Other

## 2019-08-12 ENCOUNTER — Ambulatory Visit: Payer: Medicare Other | Admitting: Physician Assistant

## 2019-08-12 ENCOUNTER — Other Ambulatory Visit: Payer: Medicare Other

## 2019-08-16 ENCOUNTER — Telehealth: Payer: Self-pay | Admitting: Cardiovascular Disease

## 2019-08-16 ENCOUNTER — Other Ambulatory Visit: Payer: Self-pay | Admitting: *Deleted

## 2019-08-16 LAB — CULTURE, BLOOD (SINGLE): MICRO NUMBER:: 1030455

## 2019-08-16 MED ORDER — AMIODARONE HCL 200 MG PO TABS: 200.0000 mg | ORAL_TABLET | Freq: Every day | ORAL | 3 refills | Status: DC

## 2019-08-16 MED ORDER — POTASSIUM CHLORIDE ER 10 MEQ PO TBCR: 10.0000 meq | EXTENDED_RELEASE_TABLET | Freq: Every day | ORAL | 1 refills | Status: DC

## 2019-08-16 NOTE — Telephone Encounter (Signed)
Spoke to pt wife and daughter. Both were very concerned that the pt was having palpitations and his heart was out of rhythm. States it has been ranging between low 40s-high 90s. He is not symptomatic. Blood pressure is stable 120/80. Advised pt come in and be seen by PA. Earliest appt 11/4 with Kerin Ransom. Pt wife and daughter stated they gave the pt an extra amiodarone last night and that helped his HR. They were wanting to know if they can give another amiodarone. Advised pt wife and daughter to give the medications as prescribed for now. Advised this would be sent to Dr Sallyanne Kuster for further review.

## 2019-08-16 NOTE — Telephone Encounter (Signed)
I would not administer carvedilol since he has had serious problems with hypotension.

## 2019-08-16 NOTE — Telephone Encounter (Signed)
We could always increase his amiodarone, but it was very helpful if he had some type of electronically to record his heart rhythm such as a Kardia device, since his rhythm is irregular the blood pressure cuff for pulse ox can be very inaccurate when reporting his heart  rate

## 2019-08-16 NOTE — Telephone Encounter (Signed)
Left pt message of Dr. Victorino December advice to not take carvedilol.

## 2019-08-16 NOTE — Telephone Encounter (Signed)
Gave pt Dr Lurline Del advice on taking 200mg . Verbalized understanding. Pt daughter continued to ask about Carvedilol. Will route to Dr C for review.

## 2019-08-16 NOTE — Telephone Encounter (Signed)
Patient c/o Palpitations:  High priority if patient c/o lightheadedness, shortness of breath, or chest pain  1) How long have you had palpitations/irregular HR/ Afib? Are you having the symptoms now? Since Saturday night   2) Are you currently experiencing lightheadedness, SOB or CP? no  3) Do you have a history of afib (atrial fibrillation) or irregular heart rhythm? yes  4) Have you checked your BP or HR? (document readings if available): HR has been between low 40's and high 90's  Are you experiencing any other symptoms? Patients wife states patient is experiencing irregular heart rate, Saturday and Sunday patient took carvedilol (COREG) 3.125 MG tablet. States that this has worked but yesterday afternoon he took another dose of amiodarone (PACERONE) 200 MG tablet because his HR was low and this gave him a higher HR of 110.

## 2019-08-16 NOTE — Telephone Encounter (Signed)
Yes increase amiodarone to 200 mg daily.

## 2019-08-18 ENCOUNTER — Encounter: Payer: Self-pay | Admitting: Cardiology

## 2019-08-18 ENCOUNTER — Ambulatory Visit (INDEPENDENT_AMBULATORY_CARE_PROVIDER_SITE_OTHER): Payer: Medicare Other

## 2019-08-18 ENCOUNTER — Other Ambulatory Visit: Payer: Self-pay

## 2019-08-18 ENCOUNTER — Ambulatory Visit (INDEPENDENT_AMBULATORY_CARE_PROVIDER_SITE_OTHER): Payer: Medicare Other | Admitting: Cardiology

## 2019-08-18 VITALS — BP 126/64 | HR 123 | Ht 70.0 in

## 2019-08-18 DIAGNOSIS — I491 Atrial premature depolarization: Secondary | ICD-10-CM | POA: Diagnosis not present

## 2019-08-18 DIAGNOSIS — R000 Tachycardia, unspecified: Secondary | ICD-10-CM

## 2019-08-18 DIAGNOSIS — R5381 Other malaise: Secondary | ICD-10-CM | POA: Diagnosis not present

## 2019-08-18 DIAGNOSIS — Z952 Presence of prosthetic heart valve: Secondary | ICD-10-CM

## 2019-08-18 DIAGNOSIS — I495 Sick sinus syndrome: Secondary | ICD-10-CM

## 2019-08-18 DIAGNOSIS — Z95810 Presence of automatic (implantable) cardiac defibrillator: Secondary | ICD-10-CM

## 2019-08-18 DIAGNOSIS — I2510 Atherosclerotic heart disease of native coronary artery without angina pectoris: Secondary | ICD-10-CM

## 2019-08-18 NOTE — Assessment & Plan Note (Signed)
Pt added on to my schedule for a history of HR as low as 30 and as high as 130 on home pulse Ox

## 2019-08-18 NOTE — Assessment & Plan Note (Signed)
Pt has PACs and PVCs on his EKG which could account for his labile HR readings on home pulse Ox

## 2019-08-18 NOTE — Addendum Note (Signed)
Addended by: Ulice Brilliant T on: 08/18/2019 12:37 PM   Modules accepted: Orders

## 2019-08-18 NOTE — Assessment & Plan Note (Signed)
S/P MDT ICD 01/11/14 for [place]. Removal of system secondary to infection

## 2019-08-18 NOTE — Progress Notes (Signed)
Cardiology Office Note:    Date:  08/18/2019   ID:  William [name], DOB [birth year], MRN [id number]  PCP:  Gayland Curry, DO  Cardiologist:  Sanda Klein, MD  Electrophysiologist:  None   Referring MD: Gayland Curry, DO   No chief complaint on file. Labile HR  History of Present Illness:    William [name] is an 85 y.o.malewith history of severe aortic stenosis s/p TAVR in 49/04/262, chronic systolic CHF/non-ischemic cardiomyopathy EF 25-30% with history of ventricular tachycardia s/p AICD which has been removed due to infection (admission 11/12 with ESBL bacteremia nad Klebsiella lead vegetation), moderate CAD by cath 06/2018, 4.2cm x 5.2cm infrarenal AAA (sized by imaging this admission), dementia, obstructive sleep apnea, hyperlipidemia, severe deconditioning-wheel chair bound, degenerative arthritis/lumbar vertebral fracture,cholecystitis and cholangitis with biliary drain in place. He was admitted 11/2018 with severe sepsis secondary to cholecystitis and Enterobacter bacteremia and acute respiratory failure requiring intubation and had perc drain placed. He was readmitted 01/24/19 with AMS and found to have severe biliary sepsis. He has been followed by Dr Tommy Medal and Dr Sallyanne Kuster has seen him virtually.  He was added to my schedule today after the patient's wife reported her husband's heart rate has been fluctuating between 30-130 by pulse ox. Dr croitoru has suggested increasing his Amiodarone to 200 mg daily (the patient has no history of PAF- Amiodarone was for VT).  In the office today he was examined with his wife present and his daughter on speaker phone.  His wife seems quite anxious.  The patient appears severely debilitated.  I don't think he is any worse than usual though by they're history.  The patient has not had fever at home and has been taking food and liquids an normal.   Past Medical History:  Diagnosis Date  . AAA (abdominal aortic aneurysm) ([place])    7/13  3.8cm  . Arthritis    "joints" (01/11/2014)  . Asthma    "seasonal; some foods"   . Chronic systolic CHF (congestive heart failure) ([place])   . Dementia ([place])   . HLD (hyperlipidemia)   . HOH (hard of hearing)   . Lumbar vertebral fracture ([facility])    L1- 04/11/2014   . Parotitis 06/10/2019  . Prostate cancer ([place])   . S/P ICD (internal cardiac defibrillator) procedure, 01/11/14 removal of ERI gen and placement of Medtronic Evera XT VR & NEW Right ventricular lead Medtronic 01/12/2014  . S/P TAVR (transcatheter aortic valve replacement)    Edwards Sapien 3 THV (size 29 mm, model # [id number], serial # [id number])  . Severe aortic stenosis   . Sleep apnea    "lost 60# & don't have it anymore" (01/11/2014)  . Somnolence 08/04/2019  . UTI (urinary tract infection) 08/04/2019  . Ventricular tachycardia [facility])     Past Surgical History:  Procedure Laterality Date  . CARDIAC CATHETERIZATION  08/20/2004   noncritical CAD,mild global hypokinesis, EF 50%  . CARDIAC DEFIBRILLATOR PLACEMENT  08/23/2004   Medtronic  . CATARACT EXTRACTION W/ INTRAOCULAR LENS IMPLANT Right   . COLECTOMY  1990's  . CRYOABLATION N/A 02/24/2014   Procedure: CRYO ABLATION PROSTATE;  Surgeon: Ailene Rud, MD;  Location: [facility] ORS;  Service: Urology;  Laterality: N/A;  . ERCP N/A 01/29/2019   Procedure: ENDOSCOPIC RETROGRADE CHOLANGIOPANCREATOGRAPHY (ERCP);  Surgeon: Milus Banister, MD;  Location[facility];  Service: Endoscopy;  Laterality: N/A;  . HERNIA REPAIR     "abdomen; from colon OR"  . ICD LEAD  REMOVAL N/A 09/15/2018   Procedure: ICD LEAD REMOVAL EXTRACTION;  Surgeon: Evans Lance, MD;  Location[facility];  Service: Cardiovascular;  Laterality: N/A;  DR. BARTLE TO BACK UP  . IMPLANTABLE CARDIOVERTER DEFIBRILLATOR (ICD) GENERATOR CHANGE N/A 01/11/2014   Procedure: ICD GENERATOR CHANGE;  Surgeon: Sanda Klein, MD;  Location: [place] CATH LAB;  Service: Cardiovascular;  Laterality: N/A;  . IR CATHETER TUBE CHANGE   03/01/2019  . IR CATHETER TUBE CHANGE  04/05/2019  . IR CATHETER TUBE CHANGE  07/05/2019  . IR CATHETER TUBE CHANGE  08/02/2019  . IR EXCHANGE BILIARY DRAIN  12/18/2018  . IR EXCHANGE BILIARY DRAIN  01/05/2019  . IR EXCHANGE BILIARY DRAIN  01/25/2019  . IR PERC CHOLECYSTOSTOMY  11/22/2018  . IR RADIOLOGIST EVAL & MGMT  02/26/2019  . IR RADIOLOGIST EVAL & MGMT  03/17/2019  . IR SINUS/FIST TUBE CHK-NON GI  01/28/2019  . LEAD REVISION N/A 01/11/2014   Procedure: LEAD REVISION;  Surgeon: Sanda Klein, MD;  Location: [place] CATH LAB;  Service: Cardiovascular;  Laterality: N/A;  . NM MYOCAR PERF WALL MOTION  01/29/2012   abnormal c/o infarct/scar,no ischemia present  . PROSTATE BIOPSY N/A 11/22/2013   Procedure: PROSTATE BIOPSY AND ULTRASOUND;  Surgeon: Ailene Rud, MD;  Location: [facility];  Service: Urology;  Laterality: N/A;  . REMOVAL OF STONES  01/29/2019   Procedure: REMOVAL OF STONES;  Surgeon: Milus Banister, MD;  Location: [facility] ENDOSCOPY;  Service: Endoscopy;;  . RIGHT/LEFT HEART CATH AND CORONARY ANGIOGRAPHY N/A 07/06/2018   Procedure: RIGHT/LEFT HEART CATH AND CORONARY ANGIOGRAPHY;  Surgeon: [place] Sine, MD;  Location: [place] CV LAB;  Service: Cardiovascular;  Laterality: N/A;  . SPHINCTEROTOMY  01/29/2019   Procedure: SPHINCTEROTOMY;  Surgeon: Milus Banister, MD;  Location: [facility] ENDOSCOPY;  Service: Endoscopy;;  . TEE WITHOUT CARDIOVERSION N/A 08/18/2018   Procedure: TRANSESOPHAGEAL ECHOCARDIOGRAM (TEE);  Surgeon: Burnell Blanks, MD;  Location: [place];  Service: Open Heart Surgery;  Laterality: N/A;  . TEE WITHOUT CARDIOVERSION N/A 09/08/2018   Procedure: TRANSESOPHAGEAL ECHOCARDIOGRAM (TEE);  Surgeon: Lelon Perla, MD;  Location: [facility] ENDOSCOPY;  Service: Cardiovascular;  Laterality: N/A;  . TEE WITHOUT CARDIOVERSION N/A 09/15/2018   Procedure: TRANSESOPHAGEAL ECHOCARDIOGRAM (TEE);  Surgeon: Evans Lance, MD;  Location: [facility] OR;  Service: Cardiovascular;  Laterality: N/A;  .  TEE WITHOUT CARDIOVERSION N/A 11/26/2018   Procedure: TRANSESOPHAGEAL ECHOCARDIOGRAM (TEE);  Surgeon: Jerline Pain, MD;  Location: [facility] ENDOSCOPY;  Service: Cardiovascular;  Laterality: N/A;  . TRANSCATHETER AORTIC VALVE REPLACEMENT, TRANSFEMORAL  08/18/2018  . TRANSCATHETER AORTIC VALVE REPLACEMENT, TRANSFEMORAL N/A 08/18/2018   Procedure: TRANSCATHETER AORTIC VALVE REPLACEMENT, TRANSFEMORAL using an Edwards 20m Aortic Valve;  Surgeon: MBurnell Blanks MD;  Location: [place]  Service: Open Heart Surgery;  Laterality: N/A;    Current Medications: Current Meds  Medication Sig  . acetaminophen (TYLENOL) 500 MG tablet Take 500 mg by mouth every 6 (six) hours as needed (for pain).   .Marland Kitchenamiodarone (PACERONE) 200 MG tablet Take 1 tablet (200 mg total) by mouth daily.  . carvedilol (COREG) 3.125 MG tablet Take by mouth. Wife give to him when his heart rate is high.  . cholecalciferol (VITAMIN D) 1000 units tablet Take 2,000 Units by mouth at bedtime.  . midodrine (PROAMATINE) 5 MG tablet Take 1 tablet (5 mg total) by mouth 3 (three) times daily as needed (For hypotension.). Do not lie down for 4 hours after taking the medication.  . polyethylene  glycol powder (GLYCOLAX/MIRALAX) 17 GM/SCOOP powder MIX 17 GRAMS IN 8 OUNCES OF WATER EVERY DAY.  Marland Kitchen potassium chloride (KLOR-CON) 10 MEQ tablet Take 1 tablet (10 mEq total) by mouth daily.  . sodium chloride flush (NS) 0.9 % SOLN flush drain once daily with 5-10 cc sterile saline Record output daily  . ursodiol (ACTIGALL) 300 MG capsule Take 2 capsules (600 mg total) by mouth 2 (two) times daily.     Allergies:   Crestor [rosuvastatin], Lipitor [atorvastatin], and Shrimp [shellfish allergy]   Social History   Socioeconomic History  . Marital status: Married    Spouse name: Not on file  . Number of children: 2  . Years of education: Not on file  . Highest education level: Not on file  Occupational History  . Occupation: Owned a [employer]"  Social Needs  . Financial resource strain: Not on file  . Food insecurity    Worry: Not on file    Inability: Not on file  . Transportation needs    Medical: Not on file    Non-medical: Not on file  Tobacco Use  . Smoking status: Never Smoker  . Smokeless tobacco: Never Used  Substance and Sexual Activity  . Alcohol use: Not Currently    Alcohol/week: 0.0 standard drinks    Comment: 01/11/2014 "drink 1/2 of a beer twice per month   . Drug use: No  . Sexual activity: Not Currently  Lifestyle  . Physical activity    Days per week: Not on file    Minutes per session: Not on file  . Stress: Not on file  Relationships  . Social Herbalist on phone: Not on file    Gets together: Not on file    Attends religious service: Not on file    Active member of club or organization: Not on file    Attends meetings of clubs or organizations: Not on file    Relationship status: Not on file  Other Topics Concern  . Not on file  Social History Narrative  . Not on file     Family History: The patient's family history includes Heart failure in his mother; Pneumonia in his father.  ROS:   Please see the history of present illness.     All other systems reviewed and are negative.  EKGs/Labs/Other Studies Reviewed:      EKG:  EKG is ordered today.  The ekg ordered today demonstrates Sinus tachycardia, HR 123-IVCD, PACs, and PVCs.   Recent Labs: 11/21/2018: B Natriuretic Peptide 332.5 02/03/2019: TSH 1.596 02/08/2019: ALT 24; BUN 16; Creatinine, Ser 0.90; Hemoglobin 11.9; Magnesium 2.1; Platelets 130; Potassium 3.3; Sodium 135  Recent Lipid Panel    Component Value Date/Time   CHOL 140 01/27/2019 0425   TRIG 91 01/27/2019 0425   HDL 22 (L) 01/27/2019 0425   CHOLHDL 6.4 01/27/2019 0425   VLDL 18 01/27/2019 0425   LDLCALC 100 (H) 01/27/2019 0425    Physical Exam:    VS:  BP 126/64   Pulse (!) 123   Ht '5\' 10"'  (1.778 m)   BMI 26.54 kg/m     Wt Readings  from Last 3 Encounters:  07/21/19 185 lb (83.9 kg)  05/21/19 185 lb (83.9 kg)  02/16/19 203 lb (92.1 kg)     GEN: debilitated male, in wheel chair,  in no acute distress NECK: No JVD; No carotid bruits LYMPHATICS: No lymphadenopathy CARDIAC: RRR, no murmurs, rubs, gallops RESPIRATORY:  Clear to auscultation  without rales, wheezing or rhonchi  MUSCULOSKELETAL:  No edema; No deformity  SKIN: Warm and dry NEUROLOGIC:  Alert but slumped in wheel chair, not responsive unless vigorously stimulated.    ASSESSMENT:    Tachycardia Pt added on to my schedule for a history of HR as low as 30 and as high as 130 on home pulse Ox  PAC (premature atrial contraction) Pt has PACs and PVCs on his EKG which could account for his labile HR readings on home pulse Ox  S/P TAVR (transcatheter aortic valve replacement) S/P TAVR Nov 2019  S/P ICD (internal cardiac defibrillator) procedure S/P MDT ICD 01/11/14 for VT. Removal of system secondary to infection  Debilitated patient Pt is non ambulatory, he responds with one word answers when vigorously stimulated.  PLAN:    Initially I thought he was in AF but on further review I believe it's NSR, ST, with IVCD and PACs and PVCs.  This is the first time I have met William [name] but as best I can tell the patient has not had a significant recent decline to suggest sepsis.  His low HR reading may all be secondary to ectopy.    I offered admission for monitoring but they would like to avoid that.  The other option would be labs today and a 48 hour Holter, they would prefer this option.   Medication Adjustments/Labs and Tests Ordered: Current medicines are reviewed at length with the patient today.  Concerns regarding medicines are outlined above.  Orders Placed This Encounter  Procedures  . CBC with Differential  . Basic Metabolic Panel (BMET)  . Holter monitor - 48 hour  . EKG 12-Lead   No orders of the defined types were placed in this  encounter.   Patient Instructions  Medication Instructions:  Your physician recommends that you continue on your current medications as directed. Please refer to the Current Medication list given to you today. *If you need a refill on your cardiac medications before your next appointment, please call your pharmacy*  Lab Work: Your physician recommends that you return for lab work in: [facility], BMET If you have labs (blood work) drawn today and your tests are completely normal, you will receive your results only by: Marland Kitchen MyChart Message (if you have MyChart) OR . A paper copy in the mail If you have any lab test that is abnormal or we need to change your treatment, we will call you to review the results.  Testing/Procedures: Your physician has recommended that you wear a 48 HOUR holter monitor. Holter monitors are medical devices that record the heart's electrical activity. Doctors most often use these monitors to diagnose arrhythmias. Arrhythmias are problems with the speed or rhythm of the heartbeat. The monitor is a small, portable device. You can wear one while you do your normal daily activities. This is usually used to diagnose what is causing palpitations/syncope (passing out). [place]   Follow-Up: At [facility], you and your health needs are our priority.  As part of our continuing mission to provide you with exceptional heart care, we have created designated Provider Care Teams.  These Care Teams include your primary Cardiologist (physician) and Advanced Practice Providers (APPs -  Physician Assistants and Nurse Practitioners) who all work together to provide you with the care you need, when you need it.  Your next appointment:   TO BE DETERMINED PENDING MONITOR RESULTS   The format for your next appointment:   In Person  Provider:   Sanda Klein, MD ONLY  Other Instructions     Signed, Kerin Ransom, PA-C  08/18/2019 12:14 PM    [facility]

## 2019-08-18 NOTE — Assessment & Plan Note (Signed)
S/P TAVR Nov 2019

## 2019-08-18 NOTE — Assessment & Plan Note (Signed)
Pt is non ambulatory, he responds with one word answers when vigorously stimulated.

## 2019-08-18 NOTE — Patient Instructions (Signed)
Medication Instructions:  Your physician recommends that you continue on your current medications as directed. Please refer to the Current Medication list given to you today. *If you need a refill on your cardiac medications before your next appointment, please call your pharmacy*  Lab Work: Your physician recommends that you return for lab work in: [facility], BMET If you have labs (blood work) drawn today and your tests are completely normal, you will receive your results only by: Marland Kitchen MyChart Message (if you have MyChart) OR . A paper copy in the mail If you have any lab test that is abnormal or we need to change your treatment, we will call you to review the results.  Testing/Procedures: Your physician has recommended that you wear a 48 HOUR holter monitor. Holter monitors are medical devices that record the heart's electrical activity. Doctors most often use these monitors to diagnose arrhythmias. Arrhythmias are problems with the speed or rhythm of the heartbeat. The monitor is a small, portable device. You can wear one while you do your normal daily activities. This is usually used to diagnose what is causing palpitations/syncope (passing out). [place]   Follow-Up: At [facility], you and your health needs are our priority.  As part of our continuing mission to provide you with exceptional heart care, we have created designated Provider Care Teams.  These Care Teams include your primary Cardiologist (physician) and Advanced Practice Providers (APPs -  Physician Assistants and Nurse Practitioners) who all work together to provide you with the care you need, when you need it.  Your next appointment:   TO BE DETERMINED PENDING MONITOR RESULTS   The format for your next appointment:   In Person  Provider:   Sanda Klein, MD ONLY  Other Instructions

## 2019-08-19 ENCOUNTER — Telehealth: Payer: Self-pay | Admitting: Cardiology

## 2019-08-19 ENCOUNTER — Telehealth: Payer: Self-pay | Admitting: Internal Medicine

## 2019-08-19 LAB — CBC WITH DIFFERENTIAL/PLATELET
Basophils Absolute: 0.1 10*3/uL (ref 0.0–0.2)
Basos: 1 %
EOS (ABSOLUTE): 0.8 10*3/uL — ABNORMAL HIGH (ref 0.0–0.4)
Eos: 7 %
Hematocrit: 41.4 % (ref 37.5–51.0)
Hemoglobin: 14.7 g/dL (ref 13.0–17.7)
Immature Grans (Abs): 0 10*3/uL (ref 0.0–0.1)
Immature Granulocytes: 0 %
Lymphocytes Absolute: 2.1 10*3/uL (ref 0.7–3.1)
Lymphs: 19 %
MCH: 33.8 pg — ABNORMAL HIGH (ref 26.6–33.0)
MCHC: 35.5 g/dL (ref 31.5–35.7)
MCV: 95 fL (ref 79–97)
Monocytes Absolute: 0.8 10*3/uL (ref 0.1–0.9)
Monocytes: 8 %
Neutrophils Absolute: 7 10*3/uL (ref 1.4–7.0)
Neutrophils: 65 %
Platelets: 139 10*3/uL — ABNORMAL LOW (ref 150–450)
RBC: 4.35 x10E6/uL (ref 4.14–5.80)
RDW: 14.3 % (ref 11.6–15.4)
WBC: 10.7 10*3/uL (ref 3.4–10.8)

## 2019-08-19 LAB — BASIC METABOLIC PANEL
BUN/Creatinine Ratio: 42 — ABNORMAL HIGH (ref 10–24)
BUN: 40 mg/dL — ABNORMAL HIGH (ref 8–27)
CO2: 17 mmol/L — ABNORMAL LOW (ref 20–29)
Calcium: 8.8 mg/dL (ref 8.6–10.2)
Chloride: 105 mmol/L (ref 96–106)
Creatinine, Ser: 0.96 mg/dL (ref 0.76–1.27)
GFR calc Af Amer: 81 mL/min/{1.73_m2} (ref >59)
GFR calc non Af Amer: 70 mL/min/{1.73_m2} (ref >59)
Glucose: 209 mg/dL — ABNORMAL HIGH (ref 65–99)
Potassium: 3.9 mmol/L (ref 3.5–5.2)
Sodium: 139 mmol/L (ref 134–144)

## 2019-08-19 NOTE — Telephone Encounter (Signed)
I called the patient's wife to discuss lab results.  It does not appear he has an ongoing infection. The labs do suggest he may be mildly dehydrated.  She admits keeping him hydrated has been an ongoing battle.  She also asked if his OV with Dr Sallyanne Kuster next week could be virtual- mainly to review the monitor results.  She tells me its a real chore to get her husband out of the house to the office. I told her I didn't see that that would be an issue and will pass it on to dr Croitoru's RN.  Kerin Ransom PA-C 08/19/2019 4:50 PM

## 2019-08-19 NOTE — Telephone Encounter (Signed)
Called patients spouse and got appt moved to [facility] appt. Ok to add patient on per Dr Lurline Del nurse.      Virtual Visit Pre-Appointment Phone Call  "Mr. [name], I am calling you today to discuss your upcoming appointment. We are currently trying to limit exposure to the virus that causes COVID-19 by seeing patients at home rather than in the office."  1. "What is the BEST phone number to call the day of the visit?" - include this in appointment notes  2. "Do you have or have access to (through a family member/friend) a smartphone with video capability that we can use for your visit?" a. If yes - list this number in appt notes as "cell" (if different from BEST phone #) and list the appointment type as a VIDEO visit in appointment notes b. If no - list the appointment type as a PHONE visit in appointment notes  3. Confirm consent - "In the setting of the current Covid19 crisis, you are scheduled for a (phone or video) visit with your provider on (date) at (time).  Just as we do with many in-office visits, in order for you to participate in this visit, we must obtain consent.  If you'd like, I can send this to your mychart (if signed up) or email for you to review.  Otherwise, I can obtain your verbal consent now.  All virtual visits are billed to your insurance company just like a normal visit would be.  By agreeing to a virtual visit, we'd like you to understand that the technology does not allow for your provider to perform an examination, and thus may limit your provider's ability to fully assess your condition. If your provider identifies any concerns that need to be evaluated in person, we will make arrangements to do so.  Finally, though the technology is pretty good, we cannot assure that it will always work on either your or our end, and in the setting of a video visit, we may have to convert it to a phone-only visit.  In either situation, we cannot ensure that we have a secure connection.  Are  you willing to proceed?" STAFF: Did the patient verbally acknowledge consent to telehealth visit? Document YES/NO here: YES  4. Advise patient to be prepared - "Two hours prior to your appointment, go ahead and check your blood pressure, pulse, oxygen saturation, and your weight (if you have the equipment to check those) and write them all down. When your visit starts, your provider will ask you for this information. If you have an Apple Watch or Kardia device, please plan to have heart rate information ready on the day of your appointment. Please have a pen and paper handy nearby the day of the visit as well."  5. Give patient instructions for MyChart download to smartphone OR [email or web address] as below if video visit (depending on what platform provider is using)  6. Inform patient they will receive a phone call 15 minutes prior to their appointment time (may be from unknown caller ID) so they should be prepared to answer    TELEPHONE CALL NOTE  William [name] has been deemed a candidate for a follow-up tele-health visit to limit community exposure during the Covid-19 pandemic. I spoke with the patient via phone to ensure availability of phone/video source, confirm preferred email & phone number, and discuss instructions and expectations.  I reminded William [name] to be prepared with any vital sign and/or heart rhythm information that could potentially  be obtained via home monitoring, at the time of his visit. I reminded William [name] to expect a phone call prior to his visit.  William [name], CMA 08/19/2019 4:59 PM   INSTRUCTIONS FOR DOWNLOADING THE MYCHART APP TO SMARTPHONE  - The patient must first make sure to have activated MyChart and know their login information - If Apple, go to [employer] and type in MyChart in the search bar and download the app. If Android, ask patient to go to [employer] and type in [place] in the search bar and download the app. The app is  free but as with any other app downloads, their phone may require them to verify saved payment information or Apple/Android password.  - The patient will need to then log into the app with their MyChart username and password, and select [facility] as their healthcare provider to link the account. When it is time for your visit, go to the MyChart app, find appointments, and click Begin Video Visit. Be sure to Select Allow for your device to access the Microphone and Camera for your visit. You will then be connected, and your provider will be with you shortly.  **If they have any issues connecting, or need assistance please contact MyChart service desk [phone])**  **If using a computer, in order to ensure the best quality for their visit they will need to use either of the following Internet Browsers: [employer], or Google Chrome**  IF USING DOXIMITY or DOXY.ME - The patient will receive a link just prior to their visit by text.     FULL LENGTH CONSENT FOR TELE-HEALTH VISIT   I hereby voluntarily request, consent and authorize [facility] and its employed or contracted physicians, physician assistants, nurse practitioners or other licensed health care professionals (the Practitioner), to provide me with telemedicine health care services (the "Services") as deemed necessary by the treating Practitioner. I acknowledge and consent to receive the Services by the Practitioner via telemedicine. I understand that the telemedicine visit will involve communicating with the Practitioner through live audiovisual communication technology and the disclosure of certain medical information by electronic transmission. I acknowledge that I have been given the opportunity to request an in-person assessment or other available alternative prior to the telemedicine visit and am voluntarily participating in the telemedicine visit.  I understand that I have the right to withhold or withdraw my  consent to the use of telemedicine in the course of my care at any time, without affecting my right to future care or treatment, and that the Practitioner or I may terminate the telemedicine visit at any time. I understand that I have the right to inspect all information obtained and/or recorded in the course of the telemedicine visit and may receive copies of available information for a reasonable fee.  I understand that some of the potential risks of receiving the Services via telemedicine include:  Marland Kitchen Delay or interruption in medical evaluation due to technological equipment failure or disruption; . Information transmitted may not be sufficient (e.g. poor resolution of images) to allow for appropriate medical decision making by the Practitioner; and/or  . In rare instances, security protocols could fail, causing a breach of personal health information.  Furthermore, I acknowledge that it is my responsibility to provide information about my medical history, conditions and care that is complete and accurate to the best of my ability. I acknowledge that Practitioner's advice, recommendations, and/or decision may be based on factors not within their  control, such as incomplete or inaccurate data provided by me or distortions of diagnostic images or specimens that may result from electronic transmissions. I understand that the practice of medicine is not an exact science and that Practitioner makes no warranties or guarantees regarding treatment outcomes. I acknowledge that I will receive a copy of this consent concurrently upon execution via email to the email address I last provided but may also request a printed copy by calling the office of [facility].    I understand that my insurance will be billed for this visit.   I have read or had this consent read to me. . I understand the contents of this consent, which adequately explains the benefits and risks of the Services being provided via telemedicine.   . I have been provided ample opportunity to ask questions regarding this consent and the Services and have had my questions answered to my satisfaction. . I give my informed consent for the services to be provided through the use of telemedicine in my medical care  By participating in this telemedicine visit I agree to the above.

## 2019-08-19 NOTE — Telephone Encounter (Signed)
-----   Message from Gayland Curry, DO sent at 08/18/2019  1:38 PM EST ----- Regarding: needs appt at [facility] It looks like Mr. Eshbach needs a visit with [place].  He's been seen only by his specialists and notes indicate he's taking a turn for the worse.  Can you please call his wife and try to get him in?  He needs to be seen in person at this point so he can be fully assessed.   ----- Message ----- From: Erlene Quan, PA-C Sent: 08/18/2019  12:28 PM EST To: Gayland Curry, DO

## 2019-08-20 NOTE — Progress Notes (Signed)
I agree that ECG is not AFib. Will wait for monitor.

## 2019-08-24 ENCOUNTER — Telehealth: Payer: Self-pay | Admitting: *Deleted

## 2019-08-24 NOTE — Telephone Encounter (Signed)
Virtual Visit Pre-Appointment Phone Call  "(Name), I am calling you today to discuss your upcoming appointment. We are currently trying to limit exposure to the virus that causes COVID-19 by seeing patients at home rather than in the office."  1. "What is the BEST phone number to call the day of the visit?" - include this in appointment notes  2. "Do you have or have access to (through a family member/friend) a smartphone with video capability that we can use for your visit?" a. If yes - list this number in appt notes as "cell" (if different from BEST phone #) and list the appointment type as a VIDEO visit in appointment notes b. If no - list the appointment type as a PHONE visit in appointment notes  Confirm consent - "In the setting of the current Covid19 crisis, you are scheduled for a (phone or video) visit with your provider on (date) at (time).  Just as we do with many in-office visits, in order for you to participate in this visit, we must obtain consent.  If you'd like, I can send this to your mychart (if signed up) or email for you to review.  Otherwise, I can obtain your verbal consent now.  All virtual visits are billed to your insurance company just like a normal visit would be.  By agreeing to a virtual visit, we'd like you to understand that the technology does not allow for your provider to perform an examination, and thus may limit your provider's ability to fully assess your condition. If your provider identifies any concerns that need to be evaluated in person, we will make arrangements to do so.  Finally, though the technology is pretty good, we cannot assure that it will always work on either your or our end, and in the setting of a video visit, we may have to convert it to a phone-only visit.  In either situation, we cannot ensure that we have a secure connection.  Are you willing to proceed?" YES  3. Advise patient to be prepared - "Two hours prior to your appointment, go  ahead and check your blood pressure, pulse, oxygen saturation, and your weight (if you have the equipment to check those) and write them all down. When your visit starts, your provider will ask you for this information. If you have an Apple Watch or Kardia device, please plan to have heart rate information ready on the day of your appointment. Please have a pen and paper handy nearby the day of the visit as well."  4. Give patient instructions for MyChart download to smartphone OR Doximity/Doxy.me as below if video visit (depending on what platform provider is using)  5. Inform patient they will receive a phone call 15 minutes prior to their appointment time (may be from unknown caller ID) so they should be prepared to answer    TELEPHONE CALL NOTE  Karden Schneekloth Arriaga has been deemed a candidate for a follow-up tele-health visit to limit community exposure during the Covid-19 pandemic. I spoke with the patient via phone to ensure availability of phone/video source, confirm preferred email & phone number, and discuss instructions and expectations.  I reminded Abran Borsa Roarty to be prepared with any vital sign and/or heart rhythm information that could potentially be obtained via home monitoring, at the time of his visit. I reminded Lonney Sisley Manzi to expect a phone call prior to his visit.  Ricci Barker, RN 08/24/2019 4:08 PM   INSTRUCTIONS FOR  DOWNLOADING THE MYCHART APP TO SMARTPHONE  - The patient must first make sure to have activated MyChart and know their login information - If Apple, go to [employer] and type in MyChart in the search bar and download the app. If Android, ask patient to go to [employer] and type in [place] in the search bar and download the app. The app is free but as with any other app downloads, their phone may require them to verify saved payment information or Apple/Android password.  - The patient will need to then log into the app with their MyChart  username and password, and select [facility] as their healthcare provider to link the account. When it is time for your visit, go to the MyChart app, find appointments, and click Begin Video Visit. Be sure to Select Allow for your device to access the Microphone and Camera for your visit. You will then be connected, and your provider will be with you shortly.  **If they have any issues connecting, or need assistance please contact MyChart service desk [phone])**  **If using a computer, in order to ensure the best quality for their visit they will need to use either of the following Internet Browsers: [employer], or Google Chrome**  IF USING DOXIMITY or DOXY.ME - The patient will receive a link just prior to their visit by text.     FULL LENGTH CONSENT FOR TELE-HEALTH VISIT   I hereby voluntarily request, consent and authorize [facility] and its employed or contracted physicians, physician assistants, nurse practitioners or other licensed health care professionals (the Practitioner), to provide me with telemedicine health care services (the "Services") as deemed necessary by the treating Practitioner. I acknowledge and consent to receive the Services by the Practitioner via telemedicine. I understand that the telemedicine visit will involve communicating with the Practitioner through live audiovisual communication technology and the disclosure of certain medical information by electronic transmission. I acknowledge that I have been given the opportunity to request an in-person assessment or other available alternative prior to the telemedicine visit and am voluntarily participating in the telemedicine visit.  I understand that I have the right to withhold or withdraw my consent to the use of telemedicine in the course of my care at any time, without affecting my right to future care or treatment, and that the Practitioner or I may terminate the telemedicine visit at any  time. I understand that I have the right to inspect all information obtained and/or recorded in the course of the telemedicine visit and may receive copies of available information for a reasonable fee.  I understand that some of the potential risks of receiving the Services via telemedicine include:  Marland Kitchen Delay or interruption in medical evaluation due to technological equipment failure or disruption; . Information transmitted may not be sufficient (e.g. poor resolution of images) to allow for appropriate medical decision making by the Practitioner; and/or  . In rare instances, security protocols could fail, causing a breach of personal health information.  Furthermore, I acknowledge that it is my responsibility to provide information about my medical history, conditions and care that is complete and accurate to the best of my ability. I acknowledge that Practitioner's advice, recommendations, and/or decision may be based on factors not within their control, such as incomplete or inaccurate data provided by me or distortions of diagnostic images or specimens that may result from electronic transmissions. I understand that the practice of medicine is not an exact science and that  Practitioner makes no warranties or guarantees regarding treatment outcomes. I acknowledge that I will receive a copy of this consent concurrently upon execution via email to the email address I last provided but may also request a printed copy by calling the office of [facility].    I understand that my insurance will be billed for this visit.   I have read or had this consent read to me. . I understand the contents of this consent, which adequately explains the benefits and risks of the Services being provided via telemedicine.  . I have been provided ample opportunity to ask questions regarding this consent and the Services and have had my questions answered to my satisfaction. . I give my informed consent for the services  to be provided through the use of telemedicine in my medical care  By participating in this telemedicine visit I agree to the above.

## 2019-08-25 ENCOUNTER — Encounter: Payer: Self-pay | Admitting: Cardiovascular Disease

## 2019-08-25 ENCOUNTER — Encounter (HOSPITAL_BASED_OUTPATIENT_CLINIC_OR_DEPARTMENT_OTHER): Payer: Medicare Other | Admitting: Physician Assistant

## 2019-08-25 ENCOUNTER — Telehealth (INDEPENDENT_AMBULATORY_CARE_PROVIDER_SITE_OTHER): Payer: Medicare Other | Admitting: Cardiovascular Disease

## 2019-08-25 VITALS — BP 110/66 | Ht 70.0 in

## 2019-08-25 DIAGNOSIS — I2510 Atherosclerotic heart disease of native coronary artery without angina pectoris: Secondary | ICD-10-CM | POA: Diagnosis not present

## 2019-08-25 DIAGNOSIS — Z952 Presence of prosthetic heart valve: Secondary | ICD-10-CM

## 2019-08-25 DIAGNOSIS — K8050 Calculus of bile duct without cholangitis or cholecystitis without obstruction: Secondary | ICD-10-CM | POA: Diagnosis not present

## 2019-08-25 DIAGNOSIS — Z79899 Other long term (current) drug therapy: Secondary | ICD-10-CM

## 2019-08-25 DIAGNOSIS — I428 Other cardiomyopathies: Secondary | ICD-10-CM

## 2019-08-25 DIAGNOSIS — Z5181 Encounter for therapeutic drug level monitoring: Secondary | ICD-10-CM

## 2019-08-25 DIAGNOSIS — I714 Abdominal aortic aneurysm, without rupture, unspecified: Secondary | ICD-10-CM

## 2019-08-25 DIAGNOSIS — I472 Ventricular tachycardia, unspecified: Secondary | ICD-10-CM

## 2019-08-25 DIAGNOSIS — F0390 Unspecified dementia without behavioral disturbance: Secondary | ICD-10-CM | POA: Diagnosis not present

## 2019-08-25 DIAGNOSIS — I5022 Chronic systolic (congestive) heart failure: Secondary | ICD-10-CM | POA: Diagnosis not present

## 2019-08-25 DIAGNOSIS — I950 Idiopathic hypotension: Secondary | ICD-10-CM | POA: Diagnosis not present

## 2019-08-25 NOTE — Progress Notes (Signed)
Virtual Visit via Video Note   This visit type was conducted due to national recommendations for restrictions regarding the COVID-19 Pandemic (e.g. social distancing) in an effort to limit this patient's exposure and mitigate transmission in our community.  Due to his co-morbid illnesses, this patient is at least at moderate risk for complications without adequate follow up.  This format is felt to be most appropriate for this patient at this time.  All issues noted in this document were discussed and addressed.  A limited physical exam was performed with this format.  Please refer to the patient's chart for his consent to telehealth for [facility].   Date:  08/25/2019   ID:  William [name], DOB [id number], MRN [id number]  Patient Location: Home Provider Location: Home  PCP:  Gayland Curry, DO  Cardiologist:  Sanda Klein, MD  Electrophysiologist:  None   Evaluation Performed:  Follow-Up Visit  Chief Complaint:  CHF, VT  History of Present Illness:    William [name] is a 85 y.o. 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  He has a nurse that helps with his care every day.  She reports being unable to hear his heartbeat by auscultation or check his peripheral pulse.  She has been recording his heart rates with the automatic blood pressure monitor.  She has reported heart rates as low as the 30s and 40s, but when his EKG was performed in the office it showed sinus tachycardia with very frequent PVCs.  We ordered an event monitor that shows no evidence of bradycardia.  In fact his minimum heart rate is 99 bpm,  average heart rate 104 bpm, maximum heart rate 111 bpm.  The background rhythm is always sinus with extremely frequent PVCs and frequent ventricular couplets.  No episodes of ventricular tachycardia were recorded and there was never atrial fibrillation.  He is in bed all day although they do try to set him up frequently.  His bedsores have healed.  He is unable to stand without assistance.  He gets physical therapy daily (exercises in bed).  He is less and less communicative, but does occasionally speak and is able to comprehend.  He still recognizes his wife and children.  He eats well and has a good appetite.  His weight has been stable over the last 3 months.  He has not complained of shortness of breath or chest discomfort and has not experienced syncope.  He does not have edema.  He still has a biliary drain in place.  He has been afebrile.  The patient does not have symptoms concerning for COVID-19 infection (fever, chills, cough, or new shortness of breath).    Past Medical History:  Diagnosis Date  . AAA (abdominal aortic aneurysm) ([place])    7/13 3.8cm  . Arthritis    "joints" (01/11/2014)  . Asthma    "seasonal; some foods"   . Chronic systolic CHF (congestive heart failure) ([place])   . Dementia ([place])   . HLD (hyperlipidemia)   . HOH (hard of hearing)   . Lumbar vertebral fracture ([facility])    L1- 04/11/2014   . Parotitis 06/10/2019  . Prostate cancer ([place])   . S/P  ICD (internal cardiac defibrillator) procedure, 01/11/14 removal of ERI gen and placement of Medtronic Evera XT VR & NEW Right ventricular lead Medtronic 01/12/2014  . S/P TAVR (transcatheter aortic valve replacement)    Edwards Sapien 3 THV (size 29 mm, model # [id number], serial # [id number])  . Severe aortic stenosis   . Sleep apnea    "lost 60# & don't have it anymore" (01/11/2014)  . Somnolence 08/04/2019  . UTI (urinary tract infection) 08/04/2019  . Ventricular tachycardia [facility])    Past Surgical History:  Procedure  Laterality Date  . CARDIAC CATHETERIZATION  08/20/2004   noncritical CAD,mild global hypokinesis, EF 50%  . CARDIAC DEFIBRILLATOR PLACEMENT  08/23/2004   Medtronic  . CATARACT EXTRACTION W/ INTRAOCULAR LENS IMPLANT Right   . COLECTOMY  1990's  . CRYOABLATION N/A 02/24/2014   Procedure: CRYO ABLATION PROSTATE;  Surgeon: Ailene Rud, MD;  Location: [facility] ORS;  Service: Urology;  Laterality: N/A;  . ERCP N/A 01/29/2019   Procedure: ENDOSCOPIC RETROGRADE CHOLANGIOPANCREATOGRAPHY (ERCP);  Surgeon: Milus Banister, MD;  Location: [facility] ENDOSCOPY;  Service: Endoscopy;  Laterality: N/A;  . HERNIA REPAIR     "abdomen; from colon OR"  . ICD LEAD REMOVAL N/A 09/15/2018   Procedure: ICD LEAD REMOVAL EXTRACTION;  Surgeon: Evans Lance, MD;  Location: [facility] OR;  Service: Cardiovascular;  Laterality: N/A;  DR. BARTLE TO BACK UP  . IMPLANTABLE CARDIOVERTER DEFIBRILLATOR (ICD) GENERATOR CHANGE N/A 01/11/2014   Procedure: ICD GENERATOR CHANGE;  Surgeon: Sanda Klein, MD;  Location: [place] CATH LAB;  Service: Cardiovascular;  Laterality: N/A;  . IR CATHETER TUBE CHANGE  03/01/2019  . IR CATHETER TUBE CHANGE  04/05/2019  . IR CATHETER TUBE CHANGE  07/05/2019  . IR CATHETER TUBE CHANGE  08/02/2019  . IR EXCHANGE BILIARY DRAIN  12/18/2018  . IR EXCHANGE BILIARY DRAIN  01/05/2019  . IR EXCHANGE BILIARY DRAIN  01/25/2019  . IR PERC CHOLECYSTOSTOMY  11/22/2018  . IR RADIOLOGIST EVAL & MGMT  02/26/2019  . IR RADIOLOGIST EVAL & MGMT  03/17/2019  . IR SINUS/FIST TUBE CHK-NON GI  01/28/2019  . LEAD REVISION N/A 01/11/2014   Procedure: LEAD REVISION;  Surgeon: Sanda Klein, MD;  Location: [place] CATH LAB;  Service: Cardiovascular;  Laterality: N/A;  . NM MYOCAR PERF WALL MOTION  01/29/2012   abnormal c/o infarct/scar,no ischemia present  . PROSTATE BIOPSY N/A 11/22/2013   Procedure: PROSTATE BIOPSY AND ULTRASOUND;  Surgeon: Ailene Rud, MD;  Location: [facility] ORS;  Service: Urology;  Laterality: N/A;  . REMOVAL OF STONES   01/29/2019   Procedure: REMOVAL OF STONES;  Surgeon: Milus Banister, MD;  Location: [facility] ENDOSCOPY;  Service: Endoscopy;;  . RIGHT/LEFT HEART CATH AND CORONARY ANGIOGRAPHY N/A 07/06/2018   Procedure: RIGHT/LEFT HEART CATH AND CORONARY ANGIOGRAPHY;  Surgeon: Troy Sine, MD;  Location: [place] CV LAB;  Service: Cardiovascular;  Laterality: N/A;  . SPHINCTEROTOMY  01/29/2019   Procedure: SPHINCTEROTOMY;  Surgeon: Milus Banister, MD;  Location: [facility] ENDOSCOPY;  Service: Endoscopy;;  . TEE WITHOUT CARDIOVERSION N/A 08/18/2018   Procedure: TRANSESOPHAGEAL ECHOCARDIOGRAM (TEE);  Surgeon: Burnell Blanks, MD;  Location: [place];  Service: Open Heart Surgery;  Laterality: N/A;  . TEE WITHOUT CARDIOVERSION N/A 09/08/2018   Procedure: TRANSESOPHAGEAL ECHOCARDIOGRAM (TEE);  Surgeon: Lelon Perla, MD;  Location: [facility] ENDOSCOPY;  Service: Cardiovascular;  Laterality: N/A;  . TEE WITHOUT CARDIOVERSION N/A 09/15/2018   Procedure: TRANSESOPHAGEAL ECHOCARDIOGRAM (TEE);  Surgeon: Evans Lance, MD;  Location: [facility]  OR;  Service: Cardiovascular;  Laterality: N/A;  . TEE WITHOUT CARDIOVERSION N/A 11/26/2018   Procedure: TRANSESOPHAGEAL ECHOCARDIOGRAM (TEE);  Surgeon: Jerline Pain, MD;  Location: [facility] ENDOSCOPY;  Service: Cardiovascular;  Laterality: N/A;  . TRANSCATHETER AORTIC VALVE REPLACEMENT, TRANSFEMORAL  08/18/2018  . TRANSCATHETER AORTIC VALVE REPLACEMENT, TRANSFEMORAL N/A 08/18/2018   Procedure: TRANSCATHETER AORTIC VALVE REPLACEMENT, TRANSFEMORAL using an Edwards 70mm Aortic Valve;  Surgeon: Burnell Blanks, MD;  Location: [place];  Service: Open Heart Surgery;  Laterality: N/A;     Current Meds  Medication Sig  . acetaminophen (TYLENOL) 500 MG tablet Take 500 mg by mouth every 6 (six) hours as needed (for pain).   Marland Kitchen amiodarone (PACERONE) 200 MG tablet Take 1 tablet (200 mg total) by mouth daily.  Marland Kitchen aspirin EC 81 MG tablet Take 81 mg by mouth daily.  . carvedilol (COREG) 3.125 MG tablet  Take by mouth. Wife give to him when his heart rate is high.  . cholecalciferol (VITAMIN D) 1000 units tablet Take 2,000 Units by mouth at bedtime.  . Cranberry-Vit C-Lactobacillus (RA CRANBERRY SUPPLEMENTS PO) Take by mouth.  . loratadine (CLARITIN) 10 MG tablet Take 10 mg by mouth daily.  . midodrine (PROAMATINE) 5 MG tablet Take 1 tablet (5 mg total) by mouth 3 (three) times daily as needed (For hypotension.). Do not lie down for 4 hours after taking the medication.  . polyethylene glycol powder (GLYCOLAX/MIRALAX) 17 GM/SCOOP powder MIX 17 GRAMS IN 8 OUNCES OF WATER EVERY DAY.  Marland Kitchen potassium chloride (KLOR-CON) 10 MEQ tablet Take 1 tablet (10 mEq total) by mouth daily.  . ursodiol (ACTIGALL) 300 MG capsule Take 2 capsules (600 mg total) by mouth 2 (two) times daily.     Allergies:   Crestor [rosuvastatin], Lipitor [atorvastatin], and Shrimp [shellfish allergy]   Social History   Tobacco Use  . Smoking status: Never Smoker  . Smokeless tobacco: Never Used  Substance Use Topics  . Alcohol use: Not Currently    Alcohol/week: 0.0 standard drinks    Comment: 01/11/2014 "drink 1/2 of a beer twice per month   . Drug use: No     Family Hx: The patient's family history includes Heart failure in his mother; Pneumonia in his father.  ROS:   Please see the history of present illness.    All other systems are reviewed and are negative. Review of systems is obtained from the patient's wife and daughter  Prior CV studies:   The following studies were reviewed today:  Review of notes from infectious disease specialist, GI. Review of rhythm strips from 48-hour Holter monitor performed on November 4-5.  Labs/Other Tests and Data Reviewed:    EKG:  An ECG dated 08/18/2019 was personally reviewed today and demonstrated:  Sinus tachycardia with frequent PVCs and nonspecific intraventricular conduction abnormality  Recent Labs: 11/21/2018: B Natriuretic Peptide 332.5 02/03/2019: TSH 1.596  02/08/2019: ALT 24; Magnesium 2.1 08/18/2019: BUN 40; Creatinine, Ser 0.96; Hemoglobin 14.7; Platelets 139; Potassium 3.9; Sodium 139   Recent Lipid Panel Lab Results  Component Value Date/Time   CHOL 140 01/27/2019 04:25 AM   TRIG 91 01/27/2019 04:25 AM   HDL 22 (L) 01/27/2019 04:25 AM   CHOLHDL 6.4 01/27/2019 04:25 AM   LDLCALC 100 (H) 01/27/2019 04:25 AM    Wt Readings from Last 3 Encounters:  07/21/19 185 lb (83.9 kg)  05/21/19 185 lb (83.9 kg)  02/16/19 203 lb (92.1 kg)     Objective:    Vital Signs:  BP 110/66   Ht 5\' 10"  (1.778 m)   BMI 26.54 kg/m   Unable to examine ASSESSMENT & PLAN:    1. Choledocolithiasis/recurrent biliary sepsis: Seems to be under control, on antibiotics and with biliary drain still in place.  2. NICM/chronic systolic CHF: most recent EF 25-30%, no clinical evidence of heart failure, without the need for loop diuretics.  Unable to treat with heart failure medications due to hypotension.  He is on midodrine.  3. Ventricular tachycardia: Extremely frequent PVCs and ventricular couplets, but no ventricular tachycardia seen on a 48-hour monitor.  His amiodarone dose has recently been increased to 200 mg daily and I suspect this will help with the arrhythmia.  I suspect the bradycardia is an artifact related to the very frequent PVCs that are under counted by his automatic blood pressure cuff.  I suggested getting an electronic monitor such as a Kardia device.  Of course, this will require [name]'s cooperation to hold his fingers steady on the device.  4. Severe AS s/p TAVR - TEE 11/2018 showed normal AV prosthesis.  He is at high risk for prosthetic valve endocarditis with his recurrent episodes of enterococcal bacteremia.  He was scheduled for repeat echocardiogram for his 1 year follow-up, but he had transthoracic echoes and TEE's in February 2020.  I think we can delay the follow-up transthoracic echo until February 2021.  It is very hard to transport him.   Hopefully there will be some signs of improvement in the coronavirus pandemic by then if we have access to effective vaccines.  5. CAD: Moderate CAD by relatively recent cardiac catheterization.  He has not had angina pectoris.  6. AAA - 4. 6 cm x 5.2cm by February 2020 ultrasound; CT around the same time showed advanced atherosclerotic calcifications involving the aorta. 4.6 x 4.0 cm infrarenal abdominal aortic aneurysm with a left-sided saccular component.  He is not a candidate for surgical or percutaneous graft repair.  We will not monitor this routinely.  7.  Hypotension:  On midodrine.  Cannot give beta-blockers.  8. Amiodarone: check TSH and CMET   COVID-19 Education: The signs and symptoms of COVID-19 were discussed with the patient and how to seek care for testing (follow up with PCP or arrange E-visit).  The importance of social distancing was discussed today.  Time:   Today, I have spent 29 minutes with the patient with telehealth technology discussing the above problems.     Medication Adjustments/Labs and Tests Ordered: Current medicines are reviewed at length with the patient today.  Concerns regarding medicines are outlined above.   Tests Ordered: No orders of the defined types were placed in this encounter.   Medication Changes: No orders of the defined types were placed in this encounter.   Patient Instructions  Medication Instructions:  No changes *If you need a refill on your cardiac medications before your next appointment, please call your pharmacy*  Lab Work: None ordered If you have labs (blood work) drawn today and your tests are completely normal, you will receive your results only by: Marland Kitchen MyChart Message (if you have MyChart) OR . A paper copy in the mail If you have any lab test that is abnormal or we need to change your treatment, we will call you to review the results.  Testing/Procedures: ECHO- we will have scheduling call you to reschedule the  echo for February.  Follow-Up: At [facility], you and your health needs are our priority.  As part of our  continuing mission to provide you with exceptional heart care, we have created designated Provider Care Teams.  These Care Teams include your primary Cardiologist (physician) and Advanced Practice Providers (APPs -  Physician Assistants and Nurse Practitioners) who all work together to provide you with the care you need, when you need it.  Your next appointment:   6 months  The format for your next appointment:   In Person  Provider:   Sanda Klein, MD  Other Instructions Please get a Jodelle Red and sent a reading if your heart rate is less than 50 or greater than 120.     Signed, Sanda Klein, MD  08/25/2019 10:43 AM    [place]

## 2019-08-25 NOTE — Patient Instructions (Addendum)
Medication Instructions:  No changes *If you need a refill on your cardiac medications before your next appointment, please call your pharmacy*  Lab Work: Your provider would like for you to have the following labs: CMET and TSH  If you have labs (blood work) drawn today and your tests are completely normal, you will receive your results only by: Marland Kitchen MyChart Message (if you have MyChart) OR . A paper copy in the mail If you have any lab test that is abnormal or we need to change your treatment, we will call you to review the results.  Testing/Procedures: ECHO- we will have scheduling call you to reschedule the echo for February.  Follow-Up: At [facility], you and your health needs are our priority.  As part of our continuing mission to provide you with exceptional heart care, we have created designated Provider Care Teams.  These Care Teams include your primary Cardiologist (physician) and Advanced Practice Providers (APPs -  Physician Assistants and Nurse Practitioners) who all work together to provide you with the care you need, when you need it.  Your next appointment:   6 months  The format for your next appointment:   In Person  Provider:   Sanda Klein, MD  Other Instructions Please get a Kardia device and send a reading if your heart rate is less than 50 or greater than 120. You may send this through [place].  [email or web address]

## 2019-08-26 ENCOUNTER — Ambulatory Visit: Payer: Medicare Other | Admitting: Cardiovascular Disease

## 2019-08-30 ENCOUNTER — Encounter: Payer: Self-pay | Admitting: Internal Medicine

## 2019-08-30 ENCOUNTER — Ambulatory Visit (HOSPITAL_COMMUNITY)
Admission: RE | Admit: 2019-08-30 | Discharge: 2019-08-30 | Disposition: A | Discharge status: Home / Self Care | Payer: Medicare Other | Source: Ambulatory Visit | Attending: Interventional Radiology | Admitting: Interventional Radiology

## 2019-08-30 ENCOUNTER — Ambulatory Visit (INDEPENDENT_AMBULATORY_CARE_PROVIDER_SITE_OTHER): Payer: Medicare Other | Admitting: Internal Medicine

## 2019-08-30 ENCOUNTER — Other Ambulatory Visit (HOSPITAL_COMMUNITY): Payer: Self-pay | Admitting: Interventional Radiology

## 2019-08-30 ENCOUNTER — Other Ambulatory Visit: Payer: Self-pay

## 2019-08-30 ENCOUNTER — Ambulatory Visit: Payer: Medicare Other | Admitting: Family

## 2019-08-30 VITALS — BP 120/80 | HR 98 | Temp 97.6°F

## 2019-08-30 DIAGNOSIS — Z7189 Other specified counseling: Secondary | ICD-10-CM

## 2019-08-30 DIAGNOSIS — K8050 Calculus of bile duct without cholangitis or cholecystitis without obstruction: Secondary | ICD-10-CM | POA: Diagnosis not present

## 2019-08-30 DIAGNOSIS — K8000 Calculus of gallbladder with acute cholecystitis without obstruction: Secondary | ICD-10-CM | POA: Diagnosis not present

## 2019-08-30 DIAGNOSIS — Z8679 Personal history of other diseases of the circulatory system: Secondary | ICD-10-CM | POA: Diagnosis not present

## 2019-08-30 DIAGNOSIS — K8064 Calculus of gallbladder and bile duct with chronic cholecystitis without obstruction: Secondary | ICD-10-CM

## 2019-08-30 DIAGNOSIS — F0150 Vascular dementia without behavioral disturbance: Secondary | ICD-10-CM | POA: Diagnosis not present

## 2019-08-30 DIAGNOSIS — T85520S Displacement of bile duct prosthesis, sequela: Secondary | ICD-10-CM

## 2019-08-30 DIAGNOSIS — Z4803 Encounter for change or removal of drains: Secondary | ICD-10-CM | POA: Insufficient documentation

## 2019-08-30 DIAGNOSIS — R54 Age-related physical debility: Secondary | ICD-10-CM | POA: Diagnosis not present

## 2019-08-30 HISTORY — PX: IR CATHETER TUBE CHANGE: IMG717

## 2019-08-30 MED ORDER — LIDOCAINE HCL 1 % IJ SOLN
INTRAMUSCULAR | Status: AC
  Filled 2019-08-30: qty 20

## 2019-08-30 MED ORDER — IOHEXOL 300 MG/ML  SOLN
50.0000 mL | Freq: Once | INTRAMUSCULAR | Status: AC | PRN
  Administered 2019-08-30: 25 mL

## 2019-08-30 MED FILL — NORMAL SALINE FLUSH SYRINGE: 0.9 | 22 days supply | Qty: 900 | Fill #3

## 2019-08-30 NOTE — Progress Notes (Signed)
This service is provided via telemedicine  No vital signs collected/recorded due to the encounter was a telemedicine visit.   Location of patient (ex: home, work):  Home  Patient consents to a telephone visit:  Yes  Location of the provider (ex: office, home):  Office  Name of any referring provider:  N/A  Names of all persons participating in the telemedicine service and their role in the encounter: Marisa Cyphers RMA, Hollace Kinnier DO, Virl Axe [name]) Patient, Ethelene Browns Wife and Nurse [name]  Time spent on call:  10 min    Provider:  Marrah Vanevery L. Mariea Clonts, D.O., C.M.D.  Code Status: DNR, but defibrillation desired Goals of Care:  Advanced Directives 08/31/2019  Does Patient Have a Medical Advance Directive? Yes  Type of Advance Directive Out of facility DNR (pink MOST or yellow form)  Does patient want to make changes to medical advance directive? -  Copy of [place] in Chart? -  Would patient like information on creating a medical advance directive? -  Pre-existing out of facility DNR order (yellow form or pink MOST form) -     Chief Complaint  Patient presents with  . Medical Management of Chronic Issues    check up    HPI: Patient is a 85 y.o. male spoken with today for medical management of chronic diseases.  [name], RN, and [name]'s wife, Webb Silversmith, are on the phone.    They feel like he's doing ok.  Mrs. Holway says he's getting cranberry and cran-action capsules to prevent UTIs.  He's not had any recently, fortunately.  She says she'll call me if that happens,    VS:  97.6, HR 98--many times 100--had heart monitor on him for a few days and cardiology said a bunch of pvcs, couplets and pacs, BP 120/80  97% RA As obtained by RN in home who is with him a few hrs each day.  Dementia:  Mind not clear and does not speak much.  Not walking really at all.  Has PT daily, but mind does not allow him to get up and walk.  He "raises cane when he's moved  around".  He did say good morning to his secretary who brought some things by for him early this morning.      He has his biliary tube change this afternoon.  No abdominal pain or fevers.  His wife reviewed with me that cystic duct got recurrently blocked after it was rid of stones at [facility].  Now the duct was collapsed last time.  They're waiting to see what's said about it when it's changed today.    They have not changed their goals of care for him despite his continued cognitive and functional decline:   DNR:  No CPR but they do want defibrillation and possibly ventilation.   Full treatment IVFs if indicated.  No feeding tube. Antibiotics if indicated.  Past Medical History:  Diagnosis Date  . AAA (abdominal aortic aneurysm) ([place])    7/13 3.8cm  . Arthritis    "joints" (01/11/2014)  . Asthma    "seasonal; some foods"   . Chronic systolic CHF (congestive heart failure) ([place])   . Dementia ([place])   . HLD (hyperlipidemia)   . HOH (hard of hearing)   . Lumbar vertebral fracture (HCC)    L1- 04/11/2014   . Parotitis 06/10/2019  . Prostate cancer ([place])   . S/P ICD (internal cardiac defibrillator) procedure, 01/11/14 removal of ERI gen and placement of Medtronic  Evera XT VR & NEW Right ventricular lead Medtronic 01/12/2014  . S/P TAVR (transcatheter aortic valve replacement)    Edwards Sapien 3 THV (size 29 mm, model # [id number], serial # [id number])  . Severe aortic stenosis   . Sleep apnea    "lost 60# & don't have it anymore" (01/11/2014)  . Somnolence 08/04/2019  . UTI (urinary tract infection) 08/04/2019  . Ventricular tachycardia [facility])     Past Surgical History:  Procedure Laterality Date  . CARDIAC CATHETERIZATION  08/20/2004   noncritical CAD,mild global hypokinesis, EF 50%  . CARDIAC DEFIBRILLATOR PLACEMENT  08/23/2004   Medtronic  . CATARACT EXTRACTION W/ INTRAOCULAR LENS IMPLANT Right   . COLECTOMY  1990's  . CRYOABLATION N/A 02/24/2014   Procedure: CRYO ABLATION PROSTATE;   Surgeon: Ailene Rud, MD;  Location: [facility] ORS;  Service: Urology;  Laterality: N/A;  . ERCP N/A 01/29/2019   Procedure: ENDOSCOPIC RETROGRADE CHOLANGIOPANCREATOGRAPHY (ERCP);  Surgeon: Milus Banister, MD;  Location: [facility] ENDOSCOPY;  Service: Endoscopy;  Laterality: N/A;  . HERNIA REPAIR     "abdomen; from colon OR"  . ICD LEAD REMOVAL N/A 09/15/2018   Procedure: ICD LEAD REMOVAL EXTRACTION;  Surgeon: Evans Lance, MD;  Location: [facility] OR;  Service: Cardiovascular;  Laterality: N/A;  DR. BARTLE TO BACK UP  . IMPLANTABLE CARDIOVERTER DEFIBRILLATOR (ICD) GENERATOR CHANGE N/A 01/11/2014   Procedure: ICD GENERATOR CHANGE;  Surgeon: Sanda Klein, MD;  Location: [place] CATH LAB;  Service: Cardiovascular;  Laterality: N/A;  . IR CATHETER TUBE CHANGE  03/01/2019  . IR CATHETER TUBE CHANGE  04/05/2019  . IR CATHETER TUBE CHANGE  07/05/2019  . IR CATHETER TUBE CHANGE  08/02/2019  . IR CATHETER TUBE CHANGE  08/30/2019  . IR EXCHANGE BILIARY DRAIN  12/18/2018  . IR EXCHANGE BILIARY DRAIN  01/05/2019  . IR EXCHANGE BILIARY DRAIN  01/25/2019  . IR PERC CHOLECYSTOSTOMY  11/22/2018  . IR RADIOLOGIST EVAL & MGMT  02/26/2019  . IR RADIOLOGIST EVAL & MGMT  03/17/2019  . IR SINUS/FIST TUBE CHK-NON GI  01/28/2019  . LEAD REVISION N/A 01/11/2014   Procedure: LEAD REVISION;  Surgeon: Sanda Klein, MD;  Location: [place] CATH LAB;  Service: Cardiovascular;  Laterality: N/A;  . NM MYOCAR PERF WALL MOTION  01/29/2012   abnormal c/o infarct/scar,no ischemia present  . PROSTATE BIOPSY N/A 11/22/2013   Procedure: PROSTATE BIOPSY AND ULTRASOUND;  Surgeon: Ailene Rud, MD;  Location: [facility] ORS;  Service: Urology;  Laterality: N/A;  . REMOVAL OF STONES  01/29/2019   Procedure: REMOVAL OF STONES;  Surgeon: Milus Banister, MD;  Location: [facility] ENDOSCOPY;  Service: Endoscopy;;  . RIGHT/LEFT HEART CATH AND CORONARY ANGIOGRAPHY N/A 07/06/2018   Procedure: RIGHT/LEFT HEART CATH AND CORONARY ANGIOGRAPHY;  Surgeon: Troy Sine, MD;   Location: [place] [facility] CV LAB;  Service: Cardiovascular;  Laterality: N/A;  . SPHINCTEROTOMY  01/29/2019   Procedure: SPHINCTEROTOMY;  Surgeon: Milus Banister, MD;  Location: [facility] ENDOSCOPY;  Service: Endoscopy;;  . TEE WITHOUT CARDIOVERSION N/A 08/18/2018   Procedure: TRANSESOPHAGEAL ECHOCARDIOGRAM (TEE);  Surgeon: Burnell Blanks, MD;  Location: [place];  Service: Open Heart Surgery;  Laterality: N/A;  . TEE WITHOUT CARDIOVERSION N/A 09/08/2018   Procedure: TRANSESOPHAGEAL ECHOCARDIOGRAM (TEE);  Surgeon: Lelon Perla, MD;  Location: [facility] ENDOSCOPY;  Service: Cardiovascular;  Laterality: N/A;  . TEE WITHOUT CARDIOVERSION N/A 09/15/2018   Procedure: TRANSESOPHAGEAL ECHOCARDIOGRAM (TEE);  Surgeon: Evans Lance, MD;  Location: [place];  Service: Cardiovascular;  Laterality: N/A;  . TEE WITHOUT CARDIOVERSION N/A 11/26/2018   Procedure: TRANSESOPHAGEAL ECHOCARDIOGRAM (TEE);  Surgeon: Jerline Pain, MD;  Location[facility] ENDOSCOPY;  Service: Cardiovascular;  Laterality: N/A;  . TRANSCATHETER AORTIC VALVE REPLACEMENT, TRANSFEMORAL  08/18/2018  . TRANSCATHETER AORTIC VALVE REPLACEMENT, TRANSFEMORAL N/A 08/18/2018   Procedure: TRANSCATHETER AORTIC VALVE REPLACEMENT, TRANSFEMORAL using an Edwards 32mm Aortic Valve;  Surgeon: Burnell Blanks, MD;  Location: [place];  Service: Open Heart Surgery;  Laterality: N/A;    Allergies  Allergen Reactions  . Crestor [Rosuvastatin] Other (See Comments)    Hurts muscles  . Lipitor [Atorvastatin] Other (See Comments)    Hurts stomach  . Shrimp [Shellfish Allergy] Other (See Comments)    On MAR    Outpatient Encounter Medications as of 08/30/2019  Medication Sig  . acetaminophen (TYLENOL) 500 MG tablet Take 500 mg by mouth every 6 (six) hours as needed (for pain).   Marland Kitchen amiodarone (PACERONE) 200 MG tablet Take 1 tablet (200 mg total) by mouth daily.  Marland Kitchen aspirin EC 81 MG tablet Take 81 mg by mouth daily.  . carvedilol (COREG) 3.125 MG tablet Take by mouth.  Wife give to him when his heart rate is high. prn  . cholecalciferol (VITAMIN D) 1000 units tablet Take 2,000 Units by mouth at bedtime.  . Cranberry-Vit C-Lactobacillus (RA CRANBERRY SUPPLEMENTS PO) Take by mouth.  . loratadine (CLARITIN) 10 MG tablet Take 10 mg by mouth daily.  . Multiple Vitamins-Minerals (PRESERVISION AREDS 2 PO) Take by mouth. Take one tablet by mouth dailey  . polyethylene glycol powder (GLYCOLAX/MIRALAX) 17 GM/SCOOP powder MIX 17 GRAMS IN 8 OUNCES OF WATER EVERY DAY.  Marland Kitchen potassium chloride (KLOR-CON) 10 MEQ tablet Take 1 tablet (10 mEq total) by mouth daily.  . ursodiol (ACTIGALL) 300 MG capsule Take 2 capsules (600 mg total) by mouth 2 (two) times daily.  . [DISCONTINUED] midodrine (PROAMATINE) 5 MG tablet Take 1 tablet (5 mg total) by mouth 3 (three) times daily as needed (For hypotension.). Do not lie down for 4 hours after taking the medication.  . [DISCONTINUED] sodium chloride flush (NS) 0.9 % SOLN flush drain once daily with 5-10 cc sterile saline Record output daily (Patient not taking: Reported on 08/25/2019)   No facility-administered encounter medications on file as of 08/30/2019.     Review of Systems:  Review of Systems  Constitutional: Positive for malaise/fatigue. Negative for chills and fever.  HENT: Positive for hearing loss. Negative for congestion and sore throat.   Eyes: Negative for blurred vision.  Respiratory: Negative for cough and shortness of breath.   Cardiovascular: Negative for chest pain, palpitations and leg swelling.  Gastrointestinal: Negative for abdominal pain, blood in stool, constipation, diarrhea, melena, nausea and vomiting.       Biliary drain in place  Genitourinary: Negative for dysuria.  Musculoskeletal: Positive for joint pain. Negative for falls.  Neurological: Positive for weakness. Negative for loss of consciousness.  Psychiatric/Behavioral: Positive for memory loss. Negative for depression. The patient is not  nervous/anxious and does not have insomnia.     Health Maintenance  Topic Date Due  . TETANUS/TDAP  11/02/1946  . INFLUENZA VACCINE  05/15/2019  . PNA vac Low Risk Adult  Completed    Physical Exam: Could not be performed as visit non face-to-face via phone   Labs reviewed: Basic Metabolic Panel: Recent Labs    11/25/18 0357 11/26/18 0430 11/27/18 0404  01/25/19 0305  02/03/19 0349  02/06/19 0242 02/07/19 0255 02/08/19 0327 08/18/19  1227  NA 139 138  --    < > 138   < > 131*   < > 136 135 135 139  K 3.7 3.1*  --    < > 3.7   < > 3.9   < > 3.6 4.0 3.3* 3.9  CL 108 109  --    < > 103   < > 102   < > 106 104 101 105  CO2 25 24  --    < > 23   < > 20*   < > 22 21* 24 17*  GLUCOSE 135* 133*  --    < > 151*   < > 176*   < > 125* 119* 107* 209*  BUN 14 12  --    < > 15   < > 12   < > 17 16 16  40*  CREATININE 0.88 0.81  --    < > 1.19   < > 1.03   < > 0.88 0.95 0.90 0.96  CALCIUM 7.6* 7.6*  --    < > 8.5*   < > 8.7*   < > 8.2* 8.4* 8.5* 8.8  MG 2.0 2.0 2.0  --   --    < >  --    < > 2.0 2.0 2.1  --   PHOS 2.2* 2.7 3.3  --   --   --   --   --   --   --   --   --   TSH  --   --   --   --  1.043  --  1.596  --   --   --   --   --    < > = values in this interval not displayed.   Liver Function Tests: Recent Labs    02/06/19 0242 02/07/19 0255 02/08/19 0327  AST 30 33 26  ALT 24 25 24   ALKPHOS 120 121 114  BILITOT 0.9 0.9 1.2  PROT 5.8* 6.0* 5.9*  ALBUMIN 2.1* 2.2* 2.2*   Recent Labs    11/21/18 0851 11/22/18 1229 12/12/18 1535 01/24/19 1935  LIPASE 20 18 24  51  AMYLASE 10* 8*  --   --    Recent Labs    12/12/18 1528 01/24/19 2317 02/03/19 0349  AMMONIA 9 26 20    CBC: Recent Labs    01/24/19 1935 01/25/19 0305  02/07/19 0255 02/08/19 0327 08/18/19 1227  WBC 11.1* 10.6*   < > 5.6 5.7 10.7  NEUTROABS 10.0* 9.0*  --   --   --  7.0  HGB 12.8* 12.0*   < > 12.5* 11.9* 14.7  HCT 36.6* 35.9*   < > 37.2* 33.8* 41.4  MCV 89.9 89.8   < > 93.5 88.3 95  PLT  146* 132*   < > 153 130* 139*   < > = values in this interval not displayed.   Lipid Panel: Recent Labs    01/26/19 0257 01/27/19 0425  CHOL 139 140  HDL 22* 22*  LDLCALC 101* 100*  TRIG 81 91  CHOLHDL 6.3 6.4   Lab Results  Component Value Date   HGBA1C 5.7 (H) 01/27/2019    Procedures since last visit: Ir Catheter Tube Change  Result Date: 08/30/2019 INDICATION: 85 year old male with a history of acute calculus cholecystitis as well as choledocholithiasis. He has been deemed a poor operative candidate and so was treated by placement of a percutaneous transhepatic cholecystostomy tube which was initially  placed on 11/22/2018. He had several episodes of recurrent sepsis secondary to clogging of the drainage catheter. The standard 10 French drainage catheter was ultimately upsized twice to 109 [place]. He was treated by Dr. Carole Civil at [facility] and underwent basket snare retrieval of gallstone over 3 separate interventions. Reportedly, his cystic duct was also cleared. However, on his most recent tube changes, his cystic duct remains occluded raising concern for residual or recurrent stones. He presents today for cholangiogram and tube exchange. EXAM: IR CATHETER TUBE CHANGE MEDICATIONS: None. ANESTHESIA/SEDATION: None. FLUOROSCOPY TIME:  Fluoroscopy Time: 1 minutes 30 seconds (38 mGy). COMPLICATIONS: None immediate. PROCEDURE: Informed written consent was obtained from the patient after a thorough discussion of the procedural risks, benefits and alternatives. All questions were addressed. Maximal Sterile Barrier Technique was utilized including caps, mask, sterile gowns, sterile gloves, sterile drape, hand hygiene and skin antiseptic. A timeout was performed prior to the initiation of the procedure. Contrast was injected through the existing tube and used to opacify the gallbladder lumen. The patient felt discomfort so further contrast injection was stopped. Initially, there was no filling of the  cystic duct. I was proceeding with tube exchange when on fluoroscopy I noticed a small amount of contrast in the cystic duct. Therefore, additional contrast was slowly injected in till there was successful opacification of the cystic and common bile ducts. There is an incompletely obstructing stone in the distal cystic duct as well as a numerous faceted filling defects in the common bile duct. Findings are consistent with choledocholithiasis. A small amount of contrast material passes through the ampulla and into the duodenum. The tube was cut and removed over a short Amplatz wire. Local anesthesia was attained by infiltration with 1% lidocaine. A new 66 French cook all-purpose drainage catheter was then advanced over the wire and formed in the gallbladder lumen. The catheter was secured to the skin with 0 Prolene suture. Images were obtained for the medical record. IMPRESSION: 1. Incompletely occlusive cystic duct stones and multifocal choledocholithiasis. 2. Successful exchange of 16 French percutaneous cholecystostomy tube. PLAN: Patient will return Interventional Radiology in 1 month for attempted coli cystoscopy, balloon sphincterotomy and stone expulsion. Signed, Criselda Peaches, MD, [facility] Electronically Signed   By: Jacqulynn Cadet M.D.   On: 08/30/2019 15:52   Ir Catheter Tube Change  Result Date: 08/02/2019 INDICATION: 85 year old male with a history of acute calculus cholecystitis as well as choledocholithiasis. He has been deemed a poor operative candidate and so was treated by placement of a percutaneous transhepatic cholecystostomy tube which was initially placed on 11/22/2018. He had several episodes of recurrent sepsis secondary to clogging of the drainage catheter. The standard 10 French drainage catheter was ultimately upsized twice to 14 [place]. He was recently treated by Dr. Carole Civil at [facility] and underwent basket snare  retrieval of gallstone over 3 separate interventions. Reportedly, his cystic duct was also cleared. He presents today with a 68 French tube placed at [facility] for cholangiogram and tube exchange. EXAM: IR CATHETER TUBE CHANGE MEDICATIONS: None ANESTHESIA/SEDATION: None FLUOROSCOPY TIME:  Fluoroscopy Time: 0 minutes 48 seconds (15 mGy). COMPLICATIONS: None immediate. PROCEDURE: Informed written consent was obtained from the patient after a thorough discussion of the procedural risks, benefits and alternatives. All questions were addressed. Maximal Sterile Barrier Technique was utilized including caps, mask, sterile gowns, sterile gloves, sterile drape, hand hygiene and skin antiseptic. A timeout was performed prior to the initiation of the procedure.  A gentle hand injection of contrast material was performed. Contrast opacifies the gallbladder lumen. The cystic duct does not opacify. Additional contrast material was injected in an effort to obtain imaging of the cystic duct and potentially the common bile duct. However, as the system became pressurized the patient experienced significant discomfort. Therefore, no further injection was performed. The cystic duct is currently occluded. The existing catheter was cut and removed over an Amplatz wire. A new Cook 59 [place] all-purpose drainage catheter was advanced over the wire and formed in the gallbladder lumen. The tube was connected to gravity bag drainage and secured to the skin with an 0 Prolene suture. IMPRESSION: 1. Persistent occlusion of the cystic duct. 2. Successful exchange of 16 French percutaneous cholecystostomy tube. Electronically Signed   By: Jacqulynn Cadet M.D.   On: 08/02/2019 15:15    Assessment/Plan 1. Chronic cholecystitis due to cholelithiasis with choledocholithiasis -continues with monthly biliary drain changes and lithotripsy type procedure done at [facility] that's less invasive b/c he is not a surgical candidate  2.  Biliary drain displacement, sequela -for change today -no recent pain or change in condition -does have chronic slight tachy at 98-100  3. Vascular dementia without behavioral disturbance (HCC) -progressive, now minimally verbal, ADL dependent  4. Age-related physical debility -progressing gradually since his onset of infections -dementia progressing -requires assist with all of his ADLs, no longer ambulatory   5. History of ventricular tachycardia -no longer has ICD/pacer in place due to prior bacteremia/sepsis where lead got infected -his wife does want him defibrillated if that is necessary  6. ACP (advance care planning) -briefly did review and Webb Silversmith does not wish to change any current documentation (MOST form)  Labs/tests ordered:  No new Next appt:  Visit date not found Non face-to-face time spent on televisit:  28 mins  Hurschel Paynter L. Arlo Buffone, D.O. [facility] [address], [place] [address] Cell Phone (Mon-Fri 8am-5pm):  [phone] On Call:  [phone] & follow prompts after 5pm & weekends Office Phone:  [phone] Office Fax:  [phone]

## 2019-09-01 ENCOUNTER — Ambulatory Visit (HOSPITAL_BASED_OUTPATIENT_CLINIC_OR_DEPARTMENT_OTHER): Payer: Medicare Other | Admitting: Physician Assistant

## 2019-09-02 ENCOUNTER — Encounter (HOSPITAL_BASED_OUTPATIENT_CLINIC_OR_DEPARTMENT_OTHER): Payer: Medicare Other | Admitting: Internal Medicine

## 2019-09-06 ENCOUNTER — Ambulatory Visit (INDEPENDENT_AMBULATORY_CARE_PROVIDER_SITE_OTHER): Payer: Medicare Other | Admitting: Infectious Disease

## 2019-09-06 ENCOUNTER — Other Ambulatory Visit: Payer: Self-pay

## 2019-09-06 DIAGNOSIS — K8050 Calculus of bile duct without cholangitis or cholecystitis without obstruction: Secondary | ICD-10-CM | POA: Diagnosis not present

## 2019-09-06 DIAGNOSIS — Z952 Presence of prosthetic heart valve: Secondary | ICD-10-CM | POA: Diagnosis not present

## 2019-09-06 DIAGNOSIS — R7881 Bacteremia: Secondary | ICD-10-CM

## 2019-09-06 DIAGNOSIS — K8309 Other cholangitis: Secondary | ICD-10-CM

## 2019-09-06 DIAGNOSIS — B9689 Other specified bacterial agents as the cause of diseases classified elsewhere: Secondary | ICD-10-CM | POA: Diagnosis not present

## 2019-09-06 DIAGNOSIS — F0390 Unspecified dementia without behavioral disturbance: Secondary | ICD-10-CM | POA: Diagnosis not present

## 2019-09-06 DIAGNOSIS — Z95810 Presence of automatic (implantable) cardiac defibrillator: Secondary | ICD-10-CM | POA: Diagnosis not present

## 2019-09-06 NOTE — Progress Notes (Signed)
Virtual Visit via Telephone Note  I connected with William [name] on [birth year] at  9:30 AM EST by telephone and verified that I am speaking with the correct person using two identifiers.  Location: Patient: Home Provider: [facility]   I discussed the limitations, risks, security and privacy concerns of performing an evaluation and management service by telephone and the availability of in person appointments. I also discussed with the patient's surrogate (his wife)  that there may be a patient responsible charge related to this service. The patients' surrogate expressed understanding and agreed to proceed.   History of Present Illness:  85 y.o.malewithrecurrent episodes of cholangitis and sepsis at times with bacteremias including in time this fall when he had to have his ICD removed, again remitted admitted with cholangitis and sepsis in the context of an obstructed cholecystostomy tube, and concern for potential obstruction of his biliary tree due to stones.  He was found not to be a surgical candidate which seems quite reasonable to me given his multiple comorbidities including his dementia.  GI were able to remove multiple stones during his admission and cultures from the biliary tree yielded ampicillin sensitive enterococcal species and an Enterobacter species.  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  Patient was met with with palliative care on multiple occasions but his family continued to want "aggressive care".  They have been seeming to want to have a second opinion from a surgeon which seems not very realistic especially in the current environment.  Ultimately arrangements were made for him to continue on the meropenem and later changed to  Augmentin and Bactrim and has done  well.  He has been stable.  He has been seen by interventional radiology which have changed his drain.  They cultured his bile which had  multiple calculi in it but it did not appear purulent or not documented as such. Culture was taken which I woud NOT have preferred UNLESS it was purulent. 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  He was seen at [facility] and underwent 1. Percutaneous gallstone extraction.  2. Cystic duct recanalization and balloon dilation  3. Cholecystostomy tube exchange. on August the 4th, 2020  They found   1. Minimal residual cholelithiasis with successful clearance of the  remaining gallstones.  2. Focal stenosis and occlusion of the cystic duct with successful  recanalization and balloon dilation to 53m. The cystic duct is now widely  patent.  3. Choledocholithiasis without evidence of CBD obstruction.  4 Successful exchange of a new 16 french cholecystostomy tube.   Given the large amount of gallstones found in GB decision made to leave the cholecystostomy in place and have it exchanged every 3 months  I did  think we can trial him off of antibiotics at last visit  The family wanted and have stopped the ACTIGALl though I would recommend continuing it. They are to talk with GI re this.  JShirleyhad in the interim developed swelling of what sounds like his parotid gland that is been present for roughly 10 days.  IIt did  not improved he does not have fevers chills confusion or other systemic symptoms.  I was concerned however he could have a MRSA infection in this area that might be resistant to the Bactrim that he is been taking chronically. I placed him on doxycycline. He did have a period of not improving for a few days and I was more worried about him. He has  However now per family has had resolution of his  parotitis.    Since then he developed some sleepiness along with a low-grade fever.  His urine has become darker and they had send it for analysis and culture with his primary care physician.  The organism isolated was fairly unusual 1 and was resistant to most antimicrobials that were orally available other than Macrobid.  He was initially prescribed Bactrim but then later Macrobid and apparently the urine "cleared up.  However his sleepiness has persisted may have even worsened in the last week.  He is also developed a pressure ulcer on his sacral area that was evaluated by wound care today.  He was seen by interventional radiology and who changed his drain and noted that he had now obstruction of his cystic duct by a stone.  I was apprehensive that he may be harboringInfection now at this obstructed site that could put him at risk for recurrent cholangitis and worse case scenario recurrent bacteremia.  I arrange for him to be seen in clinic in late October 2020.  On exam in clinic he is fairly somnolent wearing a mask could  barely answer any questions and is wearing mittens to restrain him from pulling out his drain.  Since I last saw William [name] he has had exchange of his drain.  He is going to be seen by an event radiology and mid December for excision further exchange of this drain and consideration of balloon sphincterotomy and stone Expulsion.  His wife and his home health nurse were on the phone today and both affirmed that he is doing relatively well.  He apparently is becoming more engaged in conversations compared to in the past.  He did not of note participate in the phone conversation today as was the case and other phone conversations.        Past Medical History:  Diagnosis Date  . AAA (abdominal aortic aneurysm) ([place])    7/13 3.8cm  . Arthritis    "joints" (01/11/2014)  . Asthma    "seasonal; some foods"   . Chronic systolic CHF (congestive heart failure) ([place])   .  Dementia ([place])   . HLD (hyperlipidemia)   . HOH (hard of hearing)   . Lumbar vertebral fracture ([facility])    L1- 04/11/2014   . Parotitis 06/10/2019  . Prostate cancer ([place])   . S/P ICD (internal cardiac defibrillator) procedure, 01/11/14 removal of ERI gen and placement of Medtronic Evera XT VR & NEW Right ventricular lead Medtronic 01/12/2014  . S/P TAVR (transcatheter aortic valve replacement)    Edwards Sapien 3 THV (size 29 mm, model # [id number], serial # [id number])  . Severe aortic stenosis   . Sleep apnea    "lost 60# & don't have it anymore" (01/11/2014)  . Somnolence 08/04/2019  . UTI (urinary tract infection) 08/04/2019  . Ventricular tachycardia [facility])     Past Surgical History:  Procedure Laterality Date  . CARDIAC CATHETERIZATION  08/20/2004   noncritical CAD,mild global hypokinesis, EF 50%  . CARDIAC DEFIBRILLATOR PLACEMENT  08/23/2004   Medtronic  . CATARACT EXTRACTION W/ INTRAOCULAR LENS IMPLANT Right   . COLECTOMY  1990's  . CRYOABLATION N/A  02/24/2014   Procedure: CRYO ABLATION PROSTATE;  Surgeon: Ailene Rud, MD;  Location: [facility] ORS;  Service: Urology;  Laterality: N/A;  . ERCP N/A 01/29/2019   Procedure: ENDOSCOPIC RETROGRADE CHOLANGIOPANCREATOGRAPHY (ERCP);  Surgeon: Milus Banister, MD;  Location: [facility] ENDOSCOPY;  Service: Endoscopy;  Laterality: N/A;  . HERNIA REPAIR     "abdomen; from colon OR"  . ICD LEAD REMOVAL N/A 09/15/2018   Procedure: ICD LEAD REMOVAL EXTRACTION;  Surgeon: Evans Lance, MD;  Location: [facility] OR;  Service: Cardiovascular;  Laterality: N/A;  DR. BARTLE TO BACK UP  . IMPLANTABLE CARDIOVERTER DEFIBRILLATOR (ICD) GENERATOR CHANGE N/A 01/11/2014   Procedure: ICD GENERATOR CHANGE;  Surgeon: Sanda Klein, MD;  Location: [place] CATH LAB;  Service: Cardiovascular;  Laterality: N/A;  . IR CATHETER TUBE CHANGE  03/01/2019  . IR CATHETER TUBE CHANGE  04/05/2019  . IR CATHETER TUBE CHANGE  07/05/2019  . IR CATHETER TUBE CHANGE  08/02/2019  . IR CATHETER TUBE  CHANGE  08/30/2019  . IR EXCHANGE BILIARY DRAIN  12/18/2018  . IR EXCHANGE BILIARY DRAIN  01/05/2019  . IR EXCHANGE BILIARY DRAIN  01/25/2019  . IR PERC CHOLECYSTOSTOMY  11/22/2018  . IR RADIOLOGIST EVAL & MGMT  02/26/2019  . IR RADIOLOGIST EVAL & MGMT  03/17/2019  . IR SINUS/FIST TUBE CHK-NON GI  01/28/2019  . LEAD REVISION N/A 01/11/2014   Procedure: LEAD REVISION;  Surgeon: Sanda Klein, MD;  Location: [place] CATH LAB;  Service: Cardiovascular;  Laterality: N/A;  . NM MYOCAR PERF WALL MOTION  01/29/2012   abnormal c/o infarct/scar,no ischemia present  . PROSTATE BIOPSY N/A 11/22/2013   Procedure: PROSTATE BIOPSY AND ULTRASOUND;  Surgeon: Ailene Rud, MD;  Location: [facility]S;  Service: Urology;  Laterality: N/A;  . REMOVAL OF STONES  01/29/2019   Procedure: REMOVAL OF STONES;  Surgeon: Milus Banister, MD;  Location: [facility] ENDOSCOPY;  Service: Endoscopy;;  . RIGHT/LEFT HEART CATH AND CORONARY ANGIOGRAPHY N/A 07/06/2018   Procedure: RIGHT/LEFT HEART CATH AND CORONARY ANGIOGRAPHY;  Surgeon: Troy Sine, MD;  Location: [place] CV LAB;  Service: Cardiovascular;  Laterality: N/A;  . SPHINCTEROTOMY  01/29/2019   Procedure: SPHINCTEROTOMY;  Surgeon: Milus Banister, MD;  Location: [facility] ENDOSCOPY;  Service: Endoscopy;;  . TEE WITHOUT CARDIOVERSION N/A 08/18/2018   Procedure: TRANSESOPHAGEAL ECHOCARDIOGRAM (TEE);  Surgeon: Burnell Blanks, MD;  Location: [place];  Service: Open Heart Surgery;  Laterality: N/A;  . TEE WITHOUT CARDIOVERSION N/A 09/08/2018   Procedure: TRANSESOPHAGEAL ECHOCARDIOGRAM (TEE);  Surgeon: Lelon Perla, MD;  Location: [facility] ENDOSCOPY;  Service: Cardiovascular;  Laterality: N/A;  . TEE WITHOUT CARDIOVERSION N/A 09/15/2018   Procedure: TRANSESOPHAGEAL ECHOCARDIOGRAM (TEE);  Surgeon: Evans Lance, MD;  Location: [facility] OR;  Service: Cardiovascular;  Laterality: N/A;  . TEE WITHOUT CARDIOVERSION N/A 11/26/2018   Procedure: TRANSESOPHAGEAL ECHOCARDIOGRAM (TEE);  Surgeon: Jerline Pain, MD;  Location: [facility] ENDOSCOPY;  Service: Cardiovascular;  Laterality: N/A;  . TRANSCATHETER AORTIC VALVE REPLACEMENT, TRANSFEMORAL  08/18/2018  . TRANSCATHETER AORTIC VALVE REPLACEMENT, TRANSFEMORAL N/A 08/18/2018   Procedure: TRANSCATHETER AORTIC VALVE REPLACEMENT, TRANSFEMORAL using an Edwards 73m Aortic Valve;  Surgeon: MBurnell Blanks MD;  [place]  Service: Open Heart Surgery;  Laterality: N/A;    Family History  Problem Relation Age of Onset  . Heart failure Mother        Died of "old age" at 957 . Pneumonia Father       Social History   Socioeconomic  History  . Marital status: Married    Spouse name: Not on file  . Number of children: 2  . Years of education: Not on file  . Highest education level: Not on file  Occupational History  . Occupation: Owned a Scientist, [employer]  Social Needs  . Financial resource strain: Not on file  . Food insecurity    Worry: Not on file    Inability: Not on file  . Transportation needs    Medical: Not on file    Non-medical: Not on file  Tobacco Use  . Smoking status: Never Smoker  . Smokeless tobacco: Never Used  Substance and Sexual Activity  . Alcohol use: Not Currently    Alcohol/week: 0.0 standard drinks    Comment: 01/11/2014 "drink 1/2 of a beer twice per month   . Drug use: No  . Sexual activity: Not Currently  Lifestyle  . Physical activity    Days per week: Not on file    Minutes per session: Not on file  . Stress: Not on file  Relationships  . Social Herbalist on phone: Not on file    Gets together: Not on file    Attends religious service: Not on file    Active member of club or organization: Not on file    Attends meetings of clubs or organizations: Not on file    Relationship status: Not on file  Other Topics Concern  . Not on file  Social History Narrative  . Not on file    Allergies  Allergen Reactions  . Crestor [Rosuvastatin] Other (See Comments)    Hurts  muscles  . Lipitor [Atorvastatin] Other (See Comments)    Hurts stomach  . Shrimp [Shellfish Allergy] Other (See Comments)    On MAR     Current Outpatient Medications:  .  acetaminophen (TYLENOL) 500 MG tablet, Take 500 mg by mouth every 6 (six) hours as needed (for pain). , Disp: , Rfl:  .  amiodarone (PACERONE) 200 MG tablet, Take 1 tablet (200 mg total) by mouth daily., Disp: 45 tablet, Rfl: 3 .  aspirin EC 81 MG tablet, Take 81 mg by mouth daily., Disp: , Rfl:  .  carvedilol (COREG) 3.125 MG tablet, Take by mouth. Wife give to him when his heart rate is high. prn, Disp: , Rfl:  .  cholecalciferol (VITAMIN D) 1000 units tablet, Take 2,000 Units by mouth at bedtime., Disp: , Rfl:  .  Cranberry-Vit C-Lactobacillus (RA CRANBERRY SUPPLEMENTS PO), Take by mouth., Disp: , Rfl:  .  loratadine (CLARITIN) 10 MG tablet, Take 10 mg by mouth daily., Disp: , Rfl:  .  Multiple Vitamins-Minerals (PRESERVISION AREDS 2 PO), Take by mouth. Take one tablet by mouth dailey, Disp: , Rfl:  .  polyethylene glycol powder (GLYCOLAX/MIRALAX) 17 GM/SCOOP powder, MIX 17 GRAMS IN 8 OUNCES OF WATER EVERY DAY., Disp: 1020 g, Rfl: 6 .  potassium chloride (KLOR-CON) 10 MEQ tablet, Take 1 tablet (10 mEq total) by mouth daily., Disp: 90 tablet, Rfl: 1 .  ursodiol (ACTIGALL) 300 MG capsule, Take 2 capsules (600 mg total) by mouth 2 (two) times daily., Disp: 360 capsule, Rfl: 3   Review of Systems  Unable to perform ROS: Dementia           Assessment & Plan:     Observations/Objective:  [name] appears to be doing relatively stable off antibiotics with proper drainage from his biliary tract and consideration of balloon sphincterotomy and stone  expulsion in the future.   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  That being said he also has developed increasingly resistant bacterial flora as evidenced by microbes growing in his urine recently and VRE that grew from nonpurulent material in the biliary ducts previously.  It appears since proper drainage has been achieved we have been able to maintain control of the source of his recurrent infections.  If further stones can be expulsed that will certainly help.  We will therefore continue to watch him off antibiotics and I have scheduled him for E visit in February.   .Follow Up Instructions:    I discussed the assessment and treatment plan with the patient. The patient was provided an opportunity to ask questions and all were answered. The patient agreed with the plan and demonstrated an understanding of the instructions.   The patient was advised to call back or seek an in-person evaluation if the symptoms worsen or if the condition fails to improve as anticipated.  I provided 15 minutes of non-face-to-face time during this encounter.   Alcide Evener, MD

## 2019-09-07 ENCOUNTER — Encounter: Payer: Self-pay | Admitting: *Deleted

## 2019-09-14 ENCOUNTER — Telehealth: Payer: Self-pay | Admitting: Physician Assistant

## 2019-09-14 NOTE — Telephone Encounter (Signed)
New Message:    Wife called and wanted to ask William [name] would it be alright to reschedule pt's appointment? She would like for her to see him after his Echo on February 8th. She would like this delayed, because of COVID and transportation problems at this time.

## 2019-09-15 NOTE — Telephone Encounter (Signed)
I will take care of this

## 2019-09-15 NOTE — Progress Notes (Signed)
  [place] VALVE TEAM  Patient was due for his 1 year echo s/p TAVR and structural heart visit. The patient's wife, Webb Silversmith, called to see if this can be rescheduled due to transportation issues and concerns with covid 19 exposure. The patient is essentially bed bound and very fragile. Given circumstances, we have elected to forgo routine 1 year echocardiogram. Dr. Sallyanne Kuster did a telemedicine visit with him on 11/11 and he is stable. The patient has NYHA class II symptoms (mostly fatigue) but is non ambulatory. He does not have any significant issues with dyspnea.   [facility] Cardiomyopathy Questionnaire  KCCQ-12 09/15/2019  1 a. Ability to shower/bathe Other, Did not do  1 b. Ability to walk 1 block Other, Did not do  1 c. Ability to hurry/jog Other, Did not do  2. Edema feet/ankles/legs Never over the past 2 weeks  3. Limited by fatigue At least once a day  4. Limited by dyspnea Never over the past 2 weeks  5. Sitting up / on 3+ pillows Never over the past 2 weeks  6. Limited enjoyment of life Not limited at all  7. Rest of life w/ symptoms Somewhat satisfied  8 a. Participation in hobbies Did not limit at all  8 b. Participation in chores Did not limit at all  8 c. Visiting family/friends Did not limit at all    Angelena Form PA-C  [facility]

## 2019-09-16 LAB — HEPATIC FUNCTION PANEL
ALT: 5 — AB (ref 10–40)
AST: 11 — AB (ref 14–40)
Alkaline Phosphatase: 91 (ref 25–125)
Bilirubin, Total: 0.8

## 2019-09-16 LAB — TSH: TSH: 4.92 (ref 0.41–5.90)

## 2019-09-16 LAB — BASIC METABOLIC PANEL
BUN: 44 — AB (ref 4–21)
Chloride: 104 (ref 99–108)
Glucose: 249
Potassium: 4 (ref 3.4–5.3)
Sodium: 136 — AB (ref 137–147)

## 2019-09-16 LAB — COMPREHENSIVE METABOLIC PANEL
Albumin: 3.1 — AB (ref 3.5–5.0)
Calcium: 8.3 — AB (ref 8.7–10.7)
Globulin: 2.7

## 2019-09-20 ENCOUNTER — Encounter: Payer: Self-pay | Admitting: Internal Medicine

## 2019-09-21 LAB — CBC AND DIFFERENTIAL
HCT: 34 — AB (ref 41–53)
Hemoglobin: 12 — AB (ref 13.5–17.5)
Platelets: 128 — AB (ref 150–399)
WBC: 7.8

## 2019-09-21 NOTE — Progress Notes (Signed)
William [name], William [name] ([id number]) Visit Report for 08/04/2019 Abuse/Suicide Risk Screen Details Patient Name: Date of Service: William [name], William [name] [id number] 1:15 PM Medical Record [id number] Patient Account Number: [id number] Date of Birth/Sex: Treating RN: [birth year] (85 y.o. Male) Carlene Coria Primary Care Leanah Kolander: Hollace Kinnier Other Clinician: Referring Lelar Farewell: Treating Hailie Searight/Extender:Stone III, Stacey Drain, Tiffany Weeks in Treatment: 0 Abuse/Suicide Risk Screen Items Answer ABUSE RISK SCREEN: Has anyone close to you tried to hurt or harm you recentlyo No Do you feel uncomfortable with anyone in your familyo No Has anyone forced you do things that you didnt want to doo No Electronic Signature(s) Signed: 09/21/2019 3:01:15 PM By: Carlene Coria RN Entered By: Carlene Coria on 08/04/2019 13:43:49 -------------------------------------------------------------------------------- Activities of Daily Living Details Patient Name: Date of Service: William [name], William [name] [id number] 1:15 PM Medical Record [id number] Patient Account Number: [id number] Date of Birth/Sex: Treating RN: 04-30-1928 (85 y.o. Male) Carlene Coria Primary Care Zayyan Mullen: Hollace Kinnier Other Clinician: Referring Lakena Sparlin: Treating Jensen Kilburg/Extender:Stone III, Stacey Drain, Tiffany Weeks in Treatment: 0 Activities of Daily Living Items Answer Activities of Daily Living (Please select one for each item) Drive Automobile Not Able Take Medications Not Able Use Telephone Not Able Care for Appearance Not Able Use Toilet Not Able Manus Rudd / Shower Not Able Dress Self Not Able Feed Self Not Able Walk Not Able Get In / Out Bed Not Able Housework Not Able Prepare Meals Not Able Handle Money Not Able Shop for Self Not Able Electronic Signature(s) Signed: 09/21/2019 3:01:15 PM By: Carlene Coria RN Entered By: Carlene Coria on 08/04/2019  13:44:45 -------------------------------------------------------------------------------- Education Screening Details Patient Name: Date of Service: William [name] [id number] 1:15 PM Medical Record [id number] Patient Account Number: [id number] Date of Birth/Sex: Treating RN: 08/18/1928 (85 y.o. Male) Carlene Coria Primary Care Demitrios Molyneux: Hollace Kinnier Other Clinician: Referring Zarahi Fuerst: Treating Katrell Milhorn/Extender:Stone III, Stacey Drain, Tiffany Weeks in Treatment: 0 Primary Learner Assessed: Caregiver Reason Patient is not Primary Learner: dementia Learning Preferences/Education Level/Primary Language Learning Preference: Explanation Highest Education Level: High School Preferred Language: English Cognitive Barrier Language Barrier: No Translator Needed: No Memory Deficit: No Emotional Barrier: No Cultural/Religious Beliefs Affecting Medical Care: No Physical Barrier Impaired Vision: No Impaired Hearing: Yes Hearing Aid Decreased Hand dexterity: No Knowledge/Comprehension Knowledge Level: High Comprehension Level: High Ability to understand written High instructions: Ability to understand verbal High instructions: Motivation Anxiety Level: Calm Cooperation: Cooperative Education Importance: Acknowledges Need Interest in Health Problems: Asks Questions Perception: Coherent Willingness to Engage in Self- High Management Activities: Readiness to Engage in Self- High Management Activities: Electronic Signature(s) Signed: 09/21/2019 3:01:15 PM By: Carlene Coria RN Entered By: Carlene Coria on 08/04/2019 13:45:40 -------------------------------------------------------------------------------- Fall Risk Assessment Details Patient Name: Date of Service: William [name] [id number] 1:15 PM Medical Record [id number] Patient Account Number: [id number] Date of Birth/Sex: Treating RN: 04/18/1928 (85 y.o. Male) Carlene Coria Primary Care Jaimen Melone: Hollace Kinnier Other Clinician: Referring Berna Gitto: Treating Cassiopeia Florentino/Extender:Stone III, Stacey Drain, Tiffany Weeks in Treatment: 0 Fall Risk Assessment Items Have you had 2 or more falls in the last 12 monthso 0 No Have you had any fall that resulted in injury in the last 12 monthso 0 No FALLS RISK SCREEN History of falling - immediate or within 3 months 0 No Secondary diagnosis (Do you have 2 or more medical diagnoseso) 0 No Ambulatory aid None/bed rest/wheelchair/nurse 0 No Crutches/cane/walker 0 No Furniture 0 No Intravenous therapy Access/Saline/Heparin Lock 0 No Weak (short steps with or without shuffle, stooped but able to lift head 0 No while walking,  may seek support from furniture) Impaired (short steps with shuffle, may have difficulty arising from chair, 0 No head down, impaired balance) Mental Status Oriented to own ability 0 No Overestimates or forgets limitations 0 No Risk Level: Low Risk Score: 0 Electronic Signature(s) Signed: 09/21/2019 3:01:15 PM By: Carlene Coria RN Entered By: Carlene Coria on 08/04/2019 13:45:57 -------------------------------------------------------------------------------- Nutrition Risk Screening Details Patient Name: Date of Service: William [name], William [name] [id number] 1:15 PM Medical Record [id number] Patient Account Number: [id number] Date of Birth/Sex: Treating RN: [birth year] (85 y.o. Male) Carlene Coria Primary Care Nichola Warren: Hollace Kinnier Other Clinician: Referring Jamareon Shimel: Treating Revis Whalin/Extender:Stone III, Stacey Drain, Tiffany Weeks in Treatment: 0 Height (in): 71 Weight (lbs): 175 Body Mass Index (BMI): 24.4 Nutrition Risk Screening Items Score Screening NUTRITION RISK SCREEN: I have an illness or condition that made me change the kind and/or 0 No amount of food I eat I eat fewer than two meals per day 0 No I eat few fruits and vegetables, or milk products 0 No I have three or more drinks of beer, liquor or wine almost every  day 0 No I have tooth or mouth problems that make it hard for me to eat 0 No I don't always have enough money to buy the food I need 0 No I eat alone most of the time 0 No I take three or more different prescribed or over-the-counter drugs a day 1 Yes 0 No Without wanting to, I have lost or gained 10 pounds in the last six months I am not always physically able to shop, cook and/or feed myself 2 Yes Nutrition Protocols Good Risk Protocol Provide education on Moderate Risk Protocol 0 nutrition High Risk Proctocol Risk Level: Moderate Risk Score: 3 Electronic Signature(s) Signed: 09/21/2019 3:01:15 PM By: Carlene Coria RN Entered By: Carlene Coria on 08/04/2019 13:47:40

## 2019-09-21 NOTE — Progress Notes (Signed)
William [name], William [name] ([id number]) Visit Report for 08/04/2019 Chief Complaint Document Details Patient Name: Date of Service: William [name], William [name] [id number] 1:15 PM Medical Record [id number] Patient Account Number: [id number] Date of Birth/Sex: Treating RN: 12/13/1927 (85 y.o. Male) Levan Hurst Primary Care Provider: Hollace Kinnier Other Clinician: Referring Provider: Treating Provider/Extender:Stone III, Stacey Drain, Tiffany Weeks in Treatment: 0 Information Obtained from: Patient Chief Complaint Right gluteal pressure ulcer Electronic Signature(s) Signed: 08/04/2019 6:13:43 PM By: Worthy Keeler PA-C Entered By: Worthy Keeler on 08/04/2019 14:39:18 -------------------------------------------------------------------------------- HPI Details Patient Name: Date of Service: William [name], William [name] [id number] 1:15 PM Medical Record [id number] Patient Account Number: [id number] Date of Birth/Sex: Treating RN: Jan 17, 1928 (85 y.o. Male) Levan Hurst Primary Care Provider: Hollace Kinnier Other Clinician: Referring Provider: Treating Provider/Extender:Stone III, Stacey Drain, Tiffany Weeks in Treatment: 0 History of Present Illness HPI Description: 08/04/2019 on evaluation today patient appears to be upon initial inspection having issues in the right gluteal region unfortunately which has been present for 2-3 weeks. I did see pictures of how this began and his home health aide was there with him today showing me these pictures. His wife was in the lobby. With that being said he has been doing fairly well at this point with regard to them being very cautious to keep the pressure off of his wounds which I do believe has been beneficial at this time. With that being said there is no signs of active infection at this time and though these are stage III pressure ulcers they do not appear to be too deep which is good news. No fevers, chills, nausea, vomiting, or diarrhea. The  patient does have dementia as well as generalized muscle weakness he is pretty much bedbound. He is also incontinent of both urine and feces. He does have a history of congestive heart failure and coronary artery disease. He does not have an air mattress at this point although I think it would benefit for him to have 1. Electronic Signature(s) Signed: 08/04/2019 6:13:43 PM By: Worthy Keeler PA-C Entered By: Worthy Keeler on 08/04/2019 14:55:45 -------------------------------------------------------------------------------- Physical Exam Details Patient Name: Date of Service: William [name], William [name] [id number] 1:15 PM Medical Record [id number] Patient Account Number: [id number] Date of Birth/Sex: Treating RN: 1928-07-06 (85 y.o. Male) Levan Hurst Primary Care Provider: Hollace Kinnier Other Clinician: Referring Provider: Treating Provider/Extender:Stone III, Stacey Drain, Tiffany Weeks in Treatment: 0 Constitutional patient is hypertensive.. pulse regular and within target range for patient.Marland Kitchen respirations regular, non-labored and within target range for patient.Marland Kitchen temperature within target range for patient.. Well-nourished and well-hydrated in no acute distress. Eyes conjunctiva clear no eyelid edema noted. pupils equal round and reactive to light and accommodation. Ears, Nose, Mouth, and Throat no gross abnormality of ear auricles or external auditory canals. normal hearing noted during conversation. mucus membranes moist. Respiratory normal breathing without difficulty. clear to auscultation bilaterally. Cardiovascular regular rate and rhythm with normal S1, S2. no clubbing, cyanosis, significant edema, <3 sec cap refill. Gastrointestinal (GI) soft, non-tender, non-distended, +BS. no ventral hernia noted. Musculoskeletal Patient unable to walk. no significant deformity or arthritic changes, no loss or range of motion, no clubbing. Psychiatric Patient is not able to  cooperate in decision making regarding care. Patient has dementia. patient is confused. Notes Upon inspection today patient's wound beds currently showed signs of fairly good granulation at this point which is great news there is no signs of active infection and I am very pleased with how things seem to be progressing. With that  being said I do believe that he would benefit from potentially a collagen based dressing followed by border foam dressings in order to help keep the area sealed and allow for appropriate healing. This will also protect to some degree from the urine and feces although not 100% it still better than something such as an ABD pad which I think would become saturated with either order and then cause more significant issues. Electronic Signature(s) Signed: 08/04/2019 6:13:43 PM By: Worthy Keeler PA-C Entered By: Worthy Keeler on 08/04/2019 14:56:58 -------------------------------------------------------------------------------- Physician Orders Details Patient Name: Date of Service: William [name], William [name] [id number] 1:15 PM Medical Record [id number] Patient Account Number: [id number] Date of Birth/Sex: Treating RN: October 19, 1927 (85 y.o. Male) Levan Hurst Primary Care Provider: Hollace Kinnier Other Clinician: Referring Provider: Treating Provider/Extender:Stone III, Stacey Drain, Tiffany Weeks in Treatment: 0 Verbal / Phone Orders: No Diagnosis Coding ICD-10 Coding Code Description L89.313 Pressure ulcer of right buttock, stage 3 M62.81 Muscle weakness (generalized) N39.498 Other specified urinary incontinence R15.9 Full incontinence of feces I50.42 Chronic combined systolic (congestive) and diastolic (congestive) heart failure I25.10 Atherosclerotic heart disease of native coronary artery without angina pectoris F01.51 Vascular dementia with behavioral disturbance Follow-up Appointments Return appointment in 3 weeks. - ***[facility]YER*** Dressing Change  Frequency Wound #1 Right Gluteus Change Dressing every other day. - may change daily if needed Wound #2 Right Gluteus Change Dressing every other day. - may change daily if needed Wound #3 Right,Distal Gluteus Change Dressing every other day. - may change daily if needed Skin Barriers/Peri-Wound Care Skin Prep Wound Cleansing May shower and wash wound with soap and water. - all wounds Primary Wound Dressing Wound #1 Right Gluteus Silver Collagen - moisten with saline Wound #2 Right Gluteus Silver Collagen - moisten with saline Wound #3 Right,Distal Gluteus Silver Collagen - moisten with saline Secondary Dressing Wound #1 Right Gluteus Foam Border Wound #2 Right Gluteus Foam Border Wound #3 Right,Distal Gluteus Foam Border Off-Loading Low air-loss mattress (Group 2) Turn and reposition every 2 hours Electronic Signature(s) Signed: 08/04/2019 6:13:43 PM By: Worthy Keeler PA-C Signed: 08/04/2019 6:50:57 PM By: Levan Hurst RN, BSN Entered By: Levan Hurst on 08/04/2019 14:53:03 -------------------------------------------------------------------------------- Problem List Details Patient Name: Date of Service: William [name] [id number] 1:15 PM Medical Record [id number] Patient Account Number: [id number] Date of Birth/Sex: Treating RN: [birth year] (85 y.o. Male) Levan Hurst Primary Care Provider: Hollace Kinnier Other Clinician: Referring Provider: Treating Provider/Extender:Stone III, Stacey Drain, Tiffany Weeks in Treatment: 0 Active Problems ICD-10 Evaluated Encounter Code Description Active Date Today Diagnosis L89.313 Pressure ulcer of right buttock, stage 3 08/04/2019 No Yes M62.81 Muscle weakness (generalized) 08/04/2019 No Yes N39.498 Other specified urinary incontinence 08/04/2019 No Yes R15.9 Full incontinence of feces 08/04/2019 No Yes I50.42 Chronic combined systolic (congestive) and diastolic [id number] No Yes (congestive) heart  failure I25.10 Atherosclerotic heart disease of native coronary artery 08/04/2019 No Yes without angina pectoris F01.51 Vascular dementia with behavioral disturbance 08/04/2019 No Yes Inactive Problems Resolved Problems Electronic Signature(s) Signed: 08/04/2019 6:13:43 PM By: Worthy Keeler PA-C Entered By: Worthy Keeler on 08/04/2019 14:39:04 -------------------------------------------------------------------------------- Progress Note Details Patient Name: Date of Service: William [name] [id number] 1:15 PM Medical Record [id number] Patient Account Number: [id number] Date of Birth/Sex: Treating RN: 02-25-1928 (85 y.o. Male) Levan Hurst Primary Care Provider: Hollace Kinnier Other Clinician: Referring Provider: Treating Provider/Extender:Stone III, Stacey Drain, Tiffany Weeks in Treatment: 0 Subjective Chief Complaint Information obtained from Patient Right gluteal pressure ulcer History of Present Illness (HPI) 08/04/2019 on  evaluation today patient appears to be upon initial inspection having issues in the right gluteal region unfortunately which has been present for 2-3 weeks. I did see pictures of how this began and his home health aide was there with him today showing me these pictures. His wife was in the lobby. With that being said he has been doing fairly well at this point with regard to them being very cautious to keep the pressure off of his wounds which I do believe has been beneficial at this time. With that being said there is no signs of active infection at this time and though these are stage III pressure ulcers they do not appear to be too deep which is good news. No fevers, chills, nausea, vomiting, or diarrhea. The patient does have dementia as well as generalized muscle weakness he is pretty much bedbound. He is also incontinent of both urine and feces. He does have a history of congestive heart failure and coronary artery disease. He does not have  an air mattress at this point although I think it would benefit for him to have 1. Patient History Allergies Crestor, Lipitor Family History Cancer - Siblings, Stroke - Father, No family history of Diabetes, Heart Disease, Hereditary Spherocytosis, Hypertension, Kidney Disease, Lung Disease, Seizures, Thyroid Problems, Tuberculosis. Social History Never smoker, Marital Status - Married, Alcohol Use - Never, Drug Use - No History, Caffeine Use - Daily. Medical History Eyes Denies history of Cataracts, Glaucoma, Optic Neuritis Ear/Nose/Mouth/Throat Denies history of Chronic sinus problems/congestion, Middle ear problems Hematologic/Lymphatic Denies history of Anemia, Hemophilia, Human Immunodeficiency Virus, Lymphedema Respiratory Denies history of Aspiration, Asthma, Chronic Obstructive Pulmonary Disease (COPD), Pneumothorax, Sleep Apnea, Tuberculosis Cardiovascular Patient has history of Arrhythmia, Congestive Heart Failure, Coronary Artery Disease Denies history of Angina, Deep Vein Thrombosis, Hypertension, Hypotension, Myocardial Infarction, Peripheral Arterial Disease, Peripheral Venous Disease, Phlebitis, Vasculitis Gastrointestinal Denies history of Cirrhosis , Colitis, Crohnoos, Hepatitis A, Hepatitis B, Hepatitis C Endocrine Denies history of Type I Diabetes, Type II Diabetes Genitourinary Denies history of End Stage Renal Disease Immunological Denies history of Lupus Erythematosus, Raynaudoos, Scleroderma Integumentary (Skin) Denies history of History of Burn Musculoskeletal Denies history of Gout, Rheumatoid Arthritis, Osteoarthritis, Osteomyelitis Neurologic Patient has history of Dementia Denies history of Neuropathy, Quadriplegia, Paraplegia, Seizure Disorder Oncologic Denies history of Received Chemotherapy, Received Radiation Psychiatric Denies history of Confinement Anxiety Review of Systems (ROS) Constitutional Symptoms (General Health) Denies  complaints or symptoms of Fatigue, Fever, Chills, Marked Weight Change. Eyes Denies complaints or symptoms of Dry Eyes, Vision Changes, Glasses / Contacts. Ear/Nose/Mouth/Throat Denies complaints or symptoms of Chronic sinus problems or rhinitis. Respiratory Denies complaints or symptoms of Chronic or frequent coughs, Shortness of Breath. Cardiovascular Denies complaints or symptoms of Chest pain. Gastrointestinal Denies complaints or symptoms of Frequent diarrhea, Nausea, Vomiting. Endocrine Denies complaints or symptoms of Heat/cold intolerance. Genitourinary Denies complaints or symptoms of Frequent urination. Integumentary (Skin) Complains or has symptoms of Wounds. Musculoskeletal Denies complaints or symptoms of Muscle Pain, Muscle Weakness. Neurologic Denies complaints or symptoms of Numbness/parasthesias. Oncologic prostate ca Psychiatric Denies complaints or symptoms of Claustrophobia, Suicidal. Objective Constitutional patient is hypertensive.. pulse regular and within target range for patient.Marland Kitchen respirations regular, non-labored and within target range for patient.Marland Kitchen temperature within target range for patient.. Well-nourished and well-hydrated in no acute distress. Vitals Time Taken: 1:37 PM, Height: 71 in, Source: Stated, Weight: 175 lbs, Source: Stated, BMI: 24.4, Temperature: 97.7 F, Pulse: 52 bpm, Respiratory Rate: 20 breaths/min, Blood Pressure: 143/90 mmHg. Eyes conjunctiva clear  no eyelid edema noted. pupils equal round and reactive to light and accommodation. Ears, Nose, Mouth, and Throat no gross abnormality of ear auricles or external auditory canals. normal hearing noted during conversation. mucus membranes moist. Respiratory normal breathing without difficulty. clear to auscultation bilaterally. Cardiovascular regular rate and rhythm with normal S1, S2. no clubbing, cyanosis, significant edema, Gastrointestinal (GI) soft, non-tender, non-distended,  +BS. no ventral hernia noted. Musculoskeletal Patient unable to walk. no significant deformity or arthritic changes, no loss or range of motion, no clubbing. Psychiatric Patient is not able to cooperate in decision making regarding care. Patient has dementia. patient is confused. General Notes: Upon inspection today patient's wound beds currently showed signs of fairly good granulation at this point which is great news there is no signs of active infection and I am very pleased with how things seem to be progressing. With that being said I do believe that he would benefit from potentially a collagen based dressing followed by border foam dressings in order to help keep the area sealed and allow for appropriate healing. This will also protect to some degree from the urine and feces although not 100% it still better than something such as an ABD pad which I think would become saturated with either order and then cause more significant issues. Integumentary (Hair, Skin) Wound #1 status is Open. Original cause of wound was Blister. The wound is located on the Right Gluteus. The wound measures 2.3cm length x 2.3cm width x 0.2cm depth; 4.155cm^2 area and 0.831cm^3 volume. There is Fat Layer (Subcutaneous Tissue) Exposed exposed. There is no tunneling or undermining noted. There is a medium amount of serosanguineous drainage noted. The wound margin is flat and intact. There is large (67-100%) red granulation within the wound bed. There is no necrotic tissue within the wound bed. Wound #2 status is Open. Original cause of wound was Blister. The wound is located on the Right Gluteus. The wound measures 2.4cm length x 2cm width x 0.1cm depth; 3.77cm^2 area and 0.377cm^3 volume. There is Fat Layer (Subcutaneous Tissue) Exposed exposed. There is no tunneling or undermining noted. There is a medium amount of serosanguineous drainage noted. The wound margin is flat and intact. There is large (67-100%)  red granulation within the wound bed. There is a small (1-33%) amount of necrotic tissue within the wound bed including Adherent Slough. Wound #3 status is Open. Original cause of wound was Blister. The wound is located on the Right,Distal Gluteus. The wound measures 7cm length x 3.5cm width x 0.2cm depth; 19.242cm^2 area and 3.848cm^3 volume. There is Fat Layer (Subcutaneous Tissue) Exposed exposed. There is no tunneling or undermining noted. There is a medium amount of serosanguineous drainage noted. The wound margin is flat and intact. There is large (67-100%) red granulation within the wound bed. There is a small (1-33%) amount of necrotic tissue within the wound bed including Adherent Slough. Assessment Active Problems ICD-10 Pressure ulcer of right buttock, stage 3 Muscle weakness (generalized) Other specified urinary incontinence Full incontinence of feces Chronic combined systolic (congestive) and diastolic (congestive) heart failure Atherosclerotic heart disease of native coronary artery without angina pectoris Vascular dementia with behavioral disturbance Plan Follow-up Appointments: Return appointment in 3 weeks. - ***HOYER*** Dressing Change Frequency: Wound #1 Right Gluteus: Change Dressing every other day. - may change daily if needed Wound #2 Right Gluteus: Change Dressing every other day. - may change daily if needed Wound #3 Right,Distal Gluteus: Change Dressing every other day. - may change daily if needed  Skin Barriers/Peri-Wound Care: Skin Prep Wound Cleansing: May shower and wash wound with soap and water. - all wounds Primary Wound Dressing: Wound #1 Right Gluteus: Silver Collagen - moisten with saline Wound #2 Right Gluteus: Silver Collagen - moisten with saline Wound #3 Right,Distal Gluteus: Silver Collagen - moisten with saline Secondary Dressing: Wound #1 Right Gluteus: Foam Border Wound #2 Right Gluteus: Foam Border Wound #3 Right,Distal  Gluteus: Foam Border Off-Loading: Low air-loss mattress (Group 2) Turn and reposition every 2 hours 1. My suggestion at this time which I discussed with the patient's aide who is good to relay all of this to his wife as well he has that we go ahead and initiate treatment with a silver collagen dressing as I do feel like this is going to be beneficial for him. That will also help control any potential for excessive bacterial buildup to some degree. 2. I would recommend that we cover this with a border foam dressing which will apply both some cushioning to the wound locations as well as protection from urine and feces as far as the incontinence is concerned. 3. I would recommend an air mattress for the patient as well which I think would be appropriate and I explained that at this time I think that that something we can order and potentially get insurance to help cover at this point. 4. Even with the air mattress this does not change the fact that the patient needs to have appropriate offloading every roughly 2 hours in order to reposition and ensure there is not excessive pressure to any location. We will see patient back for reevaluation in 1 week here in the clinic. If anything worsens or changes patient will contact our office for additional recommendations. Electronic Signature(s) Signed: 08/04/2019 6:13:43 PM By: Worthy Keeler PA-C Entered By: Worthy Keeler on 08/04/2019 14:58:16 -------------------------------------------------------------------------------- HxROS Details Patient Name: Date of Service: William [name], William [name] [id number] 1:15 PM Medical Record [id number] Patient Account Number: [id number] Date of Birth/Sex: Treating RN: 20-Mar-1928 (85 y.o. Male) Carlene Coria Primary Care Provider: Hollace Kinnier Other Clinician: Referring Provider: Treating Provider/Extender:Stone III, Stacey Drain, Tiffany Weeks in Treatment: 0 Constitutional Symptoms (General  Health) Complaints and Symptoms: Negative for: Fatigue; Fever; Chills; Marked Weight Change Eyes Complaints and Symptoms: Negative for: Dry Eyes; Vision Changes; Glasses / Contacts Medical History: Negative for: Cataracts; Glaucoma; Optic Neuritis Ear/Nose/Mouth/Throat Complaints and Symptoms: Negative for: Chronic sinus problems or rhinitis Medical History: Negative for: Chronic sinus problems/congestion; Middle ear problems Respiratory Complaints and Symptoms: Negative for: Chronic or frequent coughs; Shortness of Breath Medical History: Negative for: Aspiration; Asthma; Chronic Obstructive Pulmonary Disease (COPD); Pneumothorax; Sleep Apnea; Tuberculosis Cardiovascular Complaints and Symptoms: Negative for: Chest pain Medical History: Positive for: Arrhythmia; Congestive Heart Failure; Coronary Artery Disease Negative for: Angina; Deep Vein Thrombosis; Hypertension; Hypotension; Myocardial Infarction; Peripheral Arterial Disease; Peripheral Venous Disease; Phlebitis; Vasculitis Gastrointestinal Complaints and Symptoms: Negative for: Frequent diarrhea; Nausea; Vomiting Medical History: Negative for: Cirrhosis ; Colitis; Crohns; Hepatitis A; Hepatitis B; Hepatitis C Endocrine Complaints and Symptoms: Negative for: Heat/cold intolerance Medical History: Negative for: Type I Diabetes; Type II Diabetes Genitourinary Complaints and Symptoms: Negative for: Frequent urination Medical History: Negative for: End Stage Renal Disease Integumentary (Skin) Complaints and Symptoms: Positive for: Wounds Medical History: Negative for: History of Burn Musculoskeletal Complaints and Symptoms: Negative for: Muscle Pain; Muscle Weakness Medical History: Negative for: Gout; Rheumatoid Arthritis; Osteoarthritis; Osteomyelitis Neurologic Complaints and Symptoms: Negative for: Numbness/parasthesias Medical History: Positive for: Dementia Negative for: Neuropathy; Quadriplegia;  Paraplegia; Seizure Disorder Psychiatric Complaints and Symptoms: Negative for: Claustrophobia; Suicidal Medical History: Negative for: Confinement Anxiety Hematologic/Lymphatic Medical History: Negative for: Anemia; Hemophilia; Human Immunodeficiency Virus; Lymphedema Immunological Medical History: Negative for: Lupus Erythematosus; Raynauds; Scleroderma Oncologic Complaints and Symptoms: Review of System Notes: prostate ca Medical History: Negative for: Received Chemotherapy; Received Radiation Immunizations Pneumococcal Vaccine: Received Pneumococcal Vaccination: Yes Implantable Devices None Family and Social History Cancer: Yes - Siblings; Diabetes: No; Heart Disease: No; Hereditary Spherocytosis: No; Hypertension: No; Kidney Disease: No; Lung Disease: No; Seizures: No; Stroke: Yes - Father; Thyroid Problems: No; Tuberculosis: No; Never smoker; Marital Status - Married; Alcohol Use: Never; Drug Use: No History; Caffeine Use: Daily; Financial Concerns: No; Food, Clothing or Shelter Needs: No; Support System Lacking: No; Transportation Concerns: No Electronic Signature(s) Signed: 08/04/2019 6:13:43 PM By: Worthy Keeler PA-C Signed: 09/21/2019 3:01:15 PM By: Carlene Coria RN Entered By: Carlene Coria on 08/04/2019 13:43:29 -------------------------------------------------------------------------------- SuperBill Details Patient Name: Date of Service: William [name] [id number] Medical Record [id number] Patient Account Number: [id number] Date of Birth/Sex: Treating RN: 04/11/1928 (85 y.o. Male) Levan Hurst Primary Care Provider: Hollace Kinnier Other Clinician: Referring Provider: Treating Provider/Extender:Stone III, Stacey Drain, Tiffany Weeks in Treatment: 0 Diagnosis Coding ICD-10 Codes Code Description L89.313 Pressure ulcer of right buttock, stage 3 M62.81 Muscle weakness (generalized) N39.498 Other specified urinary incontinence R15.9 Full incontinence  of feces I50.42 Chronic combined systolic (congestive) and diastolic (congestive) heart failure I25.10 Atherosclerotic heart disease of native coronary artery without angina pectoris F01.51 Vascular dementia with behavioral disturbance Facility Procedures CPT4 Code: [id number] Description: [phone] - WOUND CARE VISIT-LEV 5 EST PT Modifier: Quantity: 1 Physician Procedures CPT4 Code: [id number] Description: [id number] - WC PHYS LEVEL 4 - NEW PT ICD-10 Diagnosis Description L89.313 Pressure ulcer of right buttock, stage 3 M62.81 Muscle weakness (generalized) N39.498 Other specified urinary incontinence R15.9 Full incontinence of feces Modifier: Quantity: 1 Electronic Signature(s) Signed: 08/04/2019 6:50:57 PM By: Levan Hurst RN, BSN Signed: 08/18/2019 6:48:11 PM By: Worthy Keeler PA-C Previous Signature: 08/04/2019 6:13:43 PM Version By: Worthy Keeler PA-C Entered By: Levan Hurst on 08/04/2019 18:45:35

## 2019-09-22 ENCOUNTER — Telehealth: Payer: Self-pay

## 2019-09-22 NOTE — Telephone Encounter (Signed)
Opened in error; Disregard.

## 2019-09-23 ENCOUNTER — Ambulatory Visit: Payer: Medicare Other | Admitting: Physician Assistant

## 2019-09-23 ENCOUNTER — Other Ambulatory Visit (HOSPITAL_COMMUNITY): Payer: Medicare Other

## 2019-09-27 ENCOUNTER — Ambulatory Visit (HOSPITAL_COMMUNITY): Payer: Medicare Other

## 2019-09-27 ENCOUNTER — Other Ambulatory Visit (HOSPITAL_COMMUNITY): Payer: Self-pay | Admitting: Interventional Radiology

## 2019-09-27 ENCOUNTER — Other Ambulatory Visit: Payer: Self-pay

## 2019-09-27 ENCOUNTER — Ambulatory Visit (HOSPITAL_COMMUNITY)
Admission: RE | Admit: 2019-09-27 | Discharge: 2019-09-27 | Disposition: A | Discharge status: Home / Self Care | Payer: Medicare Other | Source: Ambulatory Visit | Attending: Interventional Radiology | Admitting: Interventional Radiology

## 2019-09-27 DIAGNOSIS — K8021 Calculus of gallbladder without cholecystitis with obstruction: Secondary | ICD-10-CM | POA: Insufficient documentation

## 2019-09-27 DIAGNOSIS — K8050 Calculus of bile duct without cholangitis or cholecystitis without obstruction: Secondary | ICD-10-CM

## 2019-09-27 DIAGNOSIS — Z4803 Encounter for change or removal of drains: Secondary | ICD-10-CM | POA: Diagnosis not present

## 2019-09-27 HISTORY — PX: IR CATHETER TUBE CHANGE: IMG717

## 2019-09-27 MED ORDER — IOHEXOL 300 MG/ML  SOLN
50.0000 mL | Freq: Once | INTRAMUSCULAR | Status: AC | PRN
  Administered 2019-09-27: 10 mL

## 2019-09-27 MED ORDER — LIDOCAINE HCL 1 % IJ SOLN
INTRAMUSCULAR | Status: AC
  Filled 2019-09-27: qty 20

## 2019-10-04 MED FILL — NORMAL SALINE FLUSH SYRINGE: 0.9 | 22 days supply | Qty: 900 | Fill #4

## 2019-10-06 DIAGNOSIS — I5022 Chronic systolic (congestive) heart failure: Secondary | ICD-10-CM

## 2019-10-06 DIAGNOSIS — F0390 Unspecified dementia without behavioral disturbance: Secondary | ICD-10-CM

## 2019-10-06 DIAGNOSIS — K819 Cholecystitis, unspecified: Secondary | ICD-10-CM

## 2019-10-06 DIAGNOSIS — A4181 Sepsis due to Enterococcus: Secondary | ICD-10-CM

## 2019-10-06 DIAGNOSIS — I472 Ventricular tachycardia: Secondary | ICD-10-CM

## 2019-10-13 ENCOUNTER — Encounter (HOSPITAL_COMMUNITY): Payer: Self-pay | Admitting: Emergency Medicine

## 2019-10-13 ENCOUNTER — Other Ambulatory Visit: Payer: Self-pay

## 2019-10-13 ENCOUNTER — Emergency Department (HOSPITAL_COMMUNITY): Payer: Medicare Other

## 2019-10-13 ENCOUNTER — Emergency Department (HOSPITAL_COMMUNITY)
Admission: EM | Admit: 2019-10-13 | Discharge: 2019-10-14 | Disposition: A | Discharge status: Home / Self Care | Payer: Medicare Other | Attending: Emergency Medicine | Admitting: Emergency Medicine

## 2019-10-13 DIAGNOSIS — Z8546 Personal history of malignant neoplasm of prostate: Secondary | ICD-10-CM | POA: Diagnosis not present

## 2019-10-13 DIAGNOSIS — I5022 Chronic systolic (congestive) heart failure: Secondary | ICD-10-CM | POA: Insufficient documentation

## 2019-10-13 DIAGNOSIS — J45909 Unspecified asthma, uncomplicated: Secondary | ICD-10-CM | POA: Diagnosis not present

## 2019-10-13 DIAGNOSIS — R05 Cough: Secondary | ICD-10-CM

## 2019-10-13 DIAGNOSIS — I447 Left bundle-branch block, unspecified: Secondary | ICD-10-CM | POA: Diagnosis not present

## 2019-10-13 DIAGNOSIS — F0390 Unspecified dementia without behavioral disturbance: Secondary | ICD-10-CM | POA: Diagnosis not present

## 2019-10-13 DIAGNOSIS — Z95810 Presence of automatic (implantable) cardiac defibrillator: Secondary | ICD-10-CM | POA: Diagnosis not present

## 2019-10-13 DIAGNOSIS — U071 COVID-19: Secondary | ICD-10-CM

## 2019-10-13 DIAGNOSIS — R451 Restlessness and agitation: Secondary | ICD-10-CM | POA: Diagnosis not present

## 2019-10-13 DIAGNOSIS — R059 Cough, unspecified: Secondary | ICD-10-CM

## 2019-10-13 DIAGNOSIS — R509 Fever, unspecified: Secondary | ICD-10-CM | POA: Diagnosis present

## 2019-10-13 LAB — COMPREHENSIVE METABOLIC PANEL
ALT: 15 U/L (ref 0–44)
AST: 19 U/L (ref 15–41)
Albumin: 2.7 g/dL — ABNORMAL LOW (ref 3.5–5.0)
Alkaline Phosphatase: 87 U/L (ref 38–126)
Anion gap: 8 (ref 5–15)
BUN: 32 mg/dL — ABNORMAL HIGH (ref 8–23)
CO2: 22 mmol/L (ref 22–32)
Calcium: 8.2 mg/dL — ABNORMAL LOW (ref 8.9–10.3)
Chloride: 101 mmol/L (ref 98–111)
Creatinine, Ser: 0.93 mg/dL (ref 0.61–1.24)
GFR calc Af Amer: >60 mL/min (ref >60)
GFR calc non Af Amer: >60 mL/min (ref >60)
Glucose, Bld: 150 mg/dL — ABNORMAL HIGH (ref 70–99)
Potassium: 4.4 mmol/L (ref 3.5–5.1)
Sodium: 131 mmol/L — ABNORMAL LOW (ref 135–145)
Total Bilirubin: 0.7 mg/dL (ref 0.3–1.2)
Total Protein: 6.1 g/dL — ABNORMAL LOW (ref 6.5–8.1)

## 2019-10-13 LAB — URINALYSIS, ROUTINE W REFLEX MICROSCOPIC
Bilirubin Urine: NEGATIVE
Glucose, UA: NEGATIVE mg/dL
Ketones, ur: NEGATIVE mg/dL
Nitrite: NEGATIVE
Protein, ur: 100 mg/dL — AB
Specific Gravity, Urine: 1.005 (ref 1.005–1.030)
WBC, UA: >50 WBC/hpf — ABNORMAL HIGH (ref 0–5)
pH: 8 (ref 5.0–8.0)

## 2019-10-13 LAB — CBC
HCT: 34.1 % — ABNORMAL LOW (ref 39.0–52.0)
Hemoglobin: 12 g/dL — ABNORMAL LOW (ref 13.0–17.0)
MCH: 34 pg (ref 26.0–34.0)
MCHC: 35.2 g/dL (ref 30.0–36.0)
MCV: 96.6 fL (ref 80.0–100.0)
Platelets: 93 10*3/uL — ABNORMAL LOW (ref 150–400)
RBC: 3.53 MIL/uL — ABNORMAL LOW (ref 4.22–5.81)
RDW: 16.8 % — ABNORMAL HIGH (ref 11.5–15.5)
WBC: 4.2 10*3/uL (ref 4.0–10.5)
nRBC: 0 % (ref 0.0–0.2)

## 2019-10-13 LAB — POC SARS CORONAVIRUS 2 AG -  ED: SARS Coronavirus 2 Ag: POSITIVE — AB

## 2019-10-13 LAB — PROTIME-INR
INR: 1.1 (ref 0.8–1.2)
Prothrombin Time: 13.7 seconds (ref 11.4–15.2)

## 2019-10-13 LAB — LACTIC ACID, PLASMA: Lactic Acid, Venous: 1.5 mmol/L (ref 0.5–1.9)

## 2019-10-13 LAB — LIPASE, BLOOD: Lipase: 26 U/L (ref 11–51)

## 2019-10-13 NOTE — ED Notes (Signed)
William [name] [id number] please call with updated information

## 2019-10-13 NOTE — ED Provider Notes (Signed)
[facility] Emergency Department Provider Note MRN:  [id number]  Arrival date & time: 10/13/19     Chief Complaint   Fever   History of Present Illness   William [name] is a 85 y.o. year-old male with a history of AAA, dementia presenting to the ED with chief complaint of fever.  Family reports fever today.  Cough and noisy lungs for the past 2 days.  Episode of vomiting yesterday.  I was unable to obtain an accurate HPI, PMH, or ROS due to the patient's dementia.  Level 5 caveat.  Review of Systems  Positive for fever, cough, vomiting.  Patient's Health History    Past Medical History:  Diagnosis Date  . AAA (abdominal aortic aneurysm) ([employer])    7/13 3.8cm  . Arthritis    "joints" (01/11/2014)  . Asthma    "seasonal; some foods"   . Chronic systolic CHF (congestive heart failure) ([place])   . Dementia ([place])   . HLD (hyperlipidemia)   . HOH (hard of hearing)   . Lumbar vertebral fracture ([facility])    L1- 04/11/2014   . Parotitis 06/10/2019  . Prostate cancer ([place])   . S/P ICD (internal cardiac defibrillator) procedure, 01/11/14 removal of ERI gen and placement of Medtronic Evera XT VR & NEW Right ventricular lead Medtronic 01/12/2014  . S/P TAVR (transcatheter aortic valve replacement)    Edwards Sapien 3 THV (size 29 mm, model # [id number], serial # [id number])  . Severe aortic stenosis   . Sleep apnea    "lost 60# & don't have it anymore" (01/11/2014)  . Somnolence 08/04/2019  . UTI (urinary tract infection) 08/04/2019  . Ventricular tachycardia [facility])     Past Surgical History:  Procedure Laterality Date  . CARDIAC CATHETERIZATION  08/20/2004   noncritical CAD,mild global hypokinesis, EF 50%  . CARDIAC DEFIBRILLATOR PLACEMENT  08/23/2004   Medtronic  . CATARACT EXTRACTION W/ INTRAOCULAR LENS IMPLANT Right   . COLECTOMY  1990's  . CRYOABLATION N/A 02/24/2014   Procedure: CRYO ABLATION PROSTATE;  Surgeon: Ailene Rud, MD;  Location: [facility] ORS;   Service: Urology;  Laterality: N/A;  . ERCP N/A 01/29/2019   Procedure: ENDOSCOPIC RETROGRADE CHOLANGIOPANCREATOGRAPHY (ERCP);  Surgeon: Milus Banister, MD;  Location: [facility] ENDOSCOPY;  Service: Endoscopy;  Laterality: N/A;  . HERNIA REPAIR     "abdomen; from colon OR"  . ICD LEAD REMOVAL N/A 09/15/2018   Procedure: ICD LEAD REMOVAL EXTRACTION;  Surgeon: Evans Lance, MD;  Location: [facility] OR;  Service: Cardiovascular;  Laterality: N/A;  DR. BARTLE TO BACK UP  . IMPLANTABLE CARDIOVERTER DEFIBRILLATOR (ICD) GENERATOR CHANGE N/A 01/11/2014   Procedure: ICD GENERATOR CHANGE;  Surgeon: Sanda Klein, MD;  Location: [place] CATH LAB;  Service: Cardiovascular;  Laterality: N/A;  . IR CATHETER TUBE CHANGE  03/01/2019  . IR CATHETER TUBE CHANGE  04/05/2019  . IR CATHETER TUBE CHANGE  07/05/2019  . IR CATHETER TUBE CHANGE  08/02/2019  . IR CATHETER TUBE CHANGE  08/30/2019  . IR CATHETER TUBE CHANGE  09/27/2019  . IR EXCHANGE BILIARY DRAIN  12/18/2018  . IR EXCHANGE BILIARY DRAIN  01/05/2019  . IR EXCHANGE BILIARY DRAIN  01/25/2019  . IR PERC CHOLECYSTOSTOMY  11/22/2018  . IR RADIOLOGIST EVAL & MGMT  02/26/2019  . IR RADIOLOGIST EVAL & MGMT  03/17/2019  . IR SINUS/FIST TUBE CHK-NON GI  01/28/2019  . LEAD REVISION N/A 01/11/2014   Procedure: LEAD REVISION;  Surgeon: Sanda Klein, MD;  Location: [facility]  CATH LAB;  Service: Cardiovascular;  Laterality: N/A;  . NM MYOCAR PERF WALL MOTION  01/29/2012   abnormal c/o infarct/scar,no ischemia present  . PROSTATE BIOPSY N/A 11/22/2013   Procedure: PROSTATE BIOPSY AND ULTRASOUND;  Surgeon: Ailene Rud, MD;  Location: [facility] ORS;  Service: Urology;  Laterality: N/A;  . REMOVAL OF STONES  01/29/2019   Procedure: REMOVAL OF STONES;  Surgeon: Milus Banister, MD;  Location: [facility] ENDOSCOPY;  Service: Endoscopy;;  . RIGHT/LEFT HEART CATH AND CORONARY ANGIOGRAPHY N/A 07/06/2018   Procedure: RIGHT/LEFT HEART CATH AND CORONARY ANGIOGRAPHY;  Surgeon: Troy Sine, MD;  Location: [place] CV LAB;  Service: Cardiovascular;  Laterality: N/A;  . SPHINCTEROTOMY  01/29/2019   Procedure: SPHINCTEROTOMY;  Surgeon: Milus Banister, MD;  Location: [facility] ENDOSCOPY;  Service: Endoscopy;;  . TEE WITHOUT CARDIOVERSION N/A 08/18/2018   Procedure: TRANSESOPHAGEAL ECHOCARDIOGRAM (TEE);  Surgeon: Burnell Blanks, MD;  Location: [place];  Service: Open Heart Surgery;  Laterality: N/A;  . TEE WITHOUT CARDIOVERSION N/A 09/08/2018   Procedure: TRANSESOPHAGEAL ECHOCARDIOGRAM (TEE);  Surgeon: Lelon Perla, MD;  Location: [facility] ENDOSCOPY;  Service: Cardiovascular;  Laterality: N/A;  . TEE WITHOUT CARDIOVERSION N/A 09/15/2018   Procedure: TRANSESOPHAGEAL ECHOCARDIOGRAM (TEE);  Surgeon: Evans Lance, MD;  Location: [facility] OR;  Service: Cardiovascular;  Laterality: N/A;  . TEE WITHOUT CARDIOVERSION N/A 11/26/2018   Procedure: TRANSESOPHAGEAL ECHOCARDIOGRAM (TEE);  Surgeon: Jerline Pain, MD;  Location: [facility] ENDOSCOPY;  Service: Cardiovascular;  Laterality: N/A;  . TRANSCATHETER AORTIC VALVE REPLACEMENT, TRANSFEMORAL  08/18/2018  . TRANSCATHETER AORTIC VALVE REPLACEMENT, TRANSFEMORAL N/A 08/18/2018   Procedure: TRANSCATHETER AORTIC VALVE REPLACEMENT, TRANSFEMORAL using an Edwards 76mm Aortic Valve;  Surgeon: Burnell Blanks, MD;  Location: [place];  Service: Open Heart Surgery;  Laterality: N/A;    Family History  Problem Relation Age of Onset  . Heart failure Mother        Died of "old age" at 61  . Pneumonia Father     Social History   Socioeconomic History  . Marital status: Married    Spouse name: Not on file  . Number of children: 2  . Years of education: Not on file  . Highest education level: Not on file  Occupational History  . Occupation: Owned a Scientist, [employer]  Tobacco Use  . Smoking status: Never Smoker  . Smokeless tobacco: Never Used  Substance and Sexual Activity  . Alcohol use: Not Currently    Alcohol/week: 0.0 standard drinks    Comment: 01/11/2014  "drink 1/2 of a beer twice per month   . Drug use: No  . Sexual activity: Not Currently  Other Topics Concern  . Not on file  Social History Narrative  . Not on file   Social Determinants of Health   Financial Resource Strain:   . Difficulty of Paying Living Expenses: Not on file  Food Insecurity:   . Worried About Charity fundraiser in the Last Year: Not on file  . Ran Out of Food in the Last Year: Not on file  Transportation Needs:   . Lack of Transportation (Medical): Not on file  . Lack of Transportation (Non-Medical): Not on file  Physical Activity:   . Days of Exercise per Week: Not on file  . Minutes of Exercise per Session: Not on file  Stress:   . Feeling of Stress : Not on file  Social Connections:   . Frequency of Communication with Friends and Family: Not on file  .  Frequency of Social Gatherings with Friends and Family: Not on file  . Attends Religious Services: Not on file  . Active Member of Clubs or Organizations: Not on file  . Attends Archivist Meetings: Not on file  . Marital Status: Not on file  Intimate Partner Violence:   . Fear of Current or Ex-Partner: Not on file  . Emotionally Abused: Not on file  . Physically Abused: Not on file  . Sexually Abused: Not on file     Physical Exam  Vital Signs and Nursing Notes reviewed Vitals:   10/13/19 2038 10/13/19 2137  BP: 120/79 111/77  Pulse: 98 95  Resp: 18 (!) 25  Temp: 98.8 F (37.1 C)   SpO2: 96% 98%    CONSTITUTIONAL: Well-appearing, NAD NEURO:  Alert and oriented x 3, no focal deficits EYES:  eyes equal and reactive ENT/NECK:  no LAD, no JVD CARDIO: Regular rate, well-perfused, normal S1 and S2 PULM:  CTAB no wheezing or rhonchi GI/GU:  normal bowel sounds, non-distended, non-tender MSK/SPINE:  No gross deformities, no edema SKIN:  no rash, atraumatic PSYCH:  Appropriate speech and behavior  Diagnostic and Interventional Summary    EKG  Interpretation  Date/Time:  Wednesday October 13 2019 21:39:05 EST Ventricular Rate:  95 PR Interval:    QRS Duration: 137 QT Interval:  397 QTC Calculation: 500 R Axis:   -62 Text Interpretation: Sinus rhythm Left bundle branch block No significant change was found Confirmed by Gerlene Fee [phone]) on 10/13/2019 10:27:52 PM      Labs Reviewed  CBC - Abnormal; Notable for the following components:      Result Value   RBC 3.53 (*)    Hemoglobin 12.0 (*)    HCT 34.1 (*)    RDW 16.8 (*)    Platelets 93 (*)    All other components within normal limits  COMPREHENSIVE METABOLIC PANEL - Abnormal; Notable for the following components:   Sodium 131 (*)    Glucose, Bld 150 (*)    BUN 32 (*)    Calcium 8.2 (*)    Total Protein 6.1 (*)    Albumin 2.7 (*)    All other components within normal limits  URINALYSIS, ROUTINE W REFLEX MICROSCOPIC - Abnormal; Notable for the following components:   APPearance CLOUDY (*)    Hgb urine dipstick MODERATE (*)    Protein, ur 100 (*)    Leukocytes,Ua MODERATE (*)    WBC, UA >50 (*)    Bacteria, UA RARE (*)    All other components within normal limits  POC SARS CORONAVIRUS 2 AG -  ED - Abnormal; Notable for the following components:   SARS Coronavirus 2 Ag POSITIVE (*)    All other components within normal limits  LACTIC ACID, PLASMA  LIPASE, BLOOD  PROTIME-INR    XR Chest Single View  Final Result      Medications - No data to display   Procedures  /  Critical Care Procedures  ED Course and Medical Decision Making  I have reviewed the triage vital signs and the nursing notes.  Pertinent labs & imaging results that were available during my care of the patient were reviewed by me and considered in my medical decision making (see below for details).     Fever, cough, vomiting, considering pneumonia, coronavirus.  Patient was tested positive for COVID-19.  Awaiting further laboratory evaluation.  Currently with no hypoxia.  Work-up  is otherwise reassuring.  No suprapubic tenderness, no  fever here, will send urinalysis for culture, question of colonization.  Symptoms all can be explained by COVID-19, no hypoxia, no increased work of breathing, no indication for admission, appropriate for discharge.  Barth Kirks. Sedonia Small, MD [place] [email or web address]  Final Clinical Impressions(s) / ED Diagnoses     ICD-10-CM   1. COVID-19  U07.1   2. Cough  R05 XR Chest Single View    XR Chest Single View    ED Discharge Orders    None       Discharge Instructions Discussed with and Provided to Patient:     Discharge Instructions     You were evaluated in the Emergency Department and after careful evaluation, we did not find any emergent condition requiring admission or further testing in the hospital.  Your exam/testing today is overall reassuring.  Symptoms seem to be due to coronavirus infection.  Testing today was reassuring, normal oxygen numbers.  Please practice home quarantine.  Please return to the Emergency Department if you experience any worsening of your condition.  We encourage you to follow up with a primary care provider.  Thank you for allowing [place] to be a part of your care.       Maudie Flakes, MD 10/13/19 [phone]

## 2019-10-13 NOTE — ED Notes (Signed)
Unsuccessful in IV stick due to agitated patient, but able to draw blood

## 2019-10-13 NOTE — Discharge Instructions (Addendum)
You were evaluated in the Emergency Department and after careful evaluation, we did not find any emergent condition requiring admission or further testing in the hospital.  Your exam/testing today is overall reassuring.  Symptoms seem to be due to coronavirus infection.  Testing today was reassuring, normal oxygen numbers.  Please practice home quarantine.  Please return to the Emergency Department if you experience any worsening of your condition.  We encourage you to follow up with a primary care provider.  Thank you for allowing [place] to be a part of your care.

## 2019-10-13 NOTE — ED Triage Notes (Signed)
Home health nurse states she heard mucus in his lower lobes And states he has had a low grade fever for the last couple days Family wants him to be evaluated and a chest xray  Hx of dementia  EMS unable to get a peripheral IV  Covid test last week was negative

## 2019-10-13 NOTE — ED Notes (Signed)
PTAR called Wife called to discuss discharge plan

## 2019-10-14 ENCOUNTER — Telehealth: Payer: Self-pay

## 2019-10-14 DIAGNOSIS — Z743 Need for continuous supervision: Secondary | ICD-10-CM | POA: Diagnosis not present

## 2019-10-14 DIAGNOSIS — R279 Unspecified lack of coordination: Secondary | ICD-10-CM | POA: Diagnosis not present

## 2019-10-14 DIAGNOSIS — R5381 Other malaise: Secondary | ICD-10-CM | POA: Diagnosis not present

## 2019-10-14 DIAGNOSIS — U071 COVID-19: Secondary | ICD-10-CM | POA: Diagnosis not present

## 2019-10-14 NOTE — Telephone Encounter (Signed)
Called William [name] and gave her information on her Tylenol question.  Told her the culture is pending.

## 2019-10-14 NOTE — Telephone Encounter (Signed)
Patient's daughter called and states her dad was recently exposed to [place], 2 weeks ago from physical therapy. She states patient was taken to hospital last night and diagnosed with COVID after having a rapid test done. Patient was then sent home with no medication but told to take tylenol as needed. Daughter is questioning the need for tylenol. She states since patient has been sick he has had very mild symptoms. She also states patient has very mild fever. Daughter is also concerned with her mom who is also a [facility] patient. States she is getting retested for virus as well.   Routing to provider to advise on tylenol recommendations at this time.

## 2019-10-14 NOTE — Telephone Encounter (Signed)
Patient's daughter, Ebony Hail, [phone], called inquiring about urinalysis results done in the hospital.  She stated the patient has a history of UTI/sepsis, and she is concerned that they are informed of any results/treatment.

## 2019-10-14 NOTE — Telephone Encounter (Deleted)
Patient's daughter, Ebony Hail, [phone], called inquiring about urinalysis/culture results in the hospital.  She stated that her father has a history of UTI/sepsis, and she is concerned that they are informed of any results/treatment in a timely fashion.  Melissa created this note and routed to Dr. Mariea Clonts.  I tried to reroute and was not able to do that, and created a second note to route to Dr. Lyndel Safe.  Patient's daughter called and states her dad was recently exposed to [place], 2 weeks ago from physical therapy. She states patient was taken to hospital last night and diagnosed with COVID after having a rapid test done. Patient was then sent home with no medication but told to take tylenol as needed. Daughter is questioning the need for tylenol. She states since patient has been sick he has had very mild symptoms. She also states patient has very mild fever. Daughter is also concerned with her mom who is also a [facility] patient. States she is getting retested for virus as well.  Routing to provider to advise on tylenol recommendations at this time.

## 2019-10-14 NOTE — Telephone Encounter (Signed)
This encounter was created in error - please disregard.

## 2019-10-14 NOTE — Telephone Encounter (Signed)
Ebony Hail asked that we notify Dr. Tommy Medal of her father's pending culture.  She stated that her father had issues with antibiotics in the past and Dr. Tommy Medal usually handles his antibiotics.  She had called Dr. Lucianne Lei Dam's office and it was already closed.  A message was sent to Dr. Tommy Medal to notify him.

## 2019-10-14 NOTE — ED Notes (Signed)
PTAR present to acquire patient

## 2019-10-14 NOTE — Telephone Encounter (Signed)
Urine Culture is pending at this time.Hopefully ED will call them if it is positive. Or Dr Mariea Clonts can follow up on Mon Tylenol is just for Low grade fever or Muscle Aches.He does not need to take it if he does not need it.

## 2019-10-16 ENCOUNTER — Telehealth: Payer: Self-pay | Admitting: Unknown Physician Specialty

## 2019-10-16 NOTE — Telephone Encounter (Signed)
Discussed with daughter about patient's Covid symptoms and the use of bamlanivimab, a monoclonal antibody infusion for those with mild to moderate Covid symptoms and at a high risk of hospitalization.  Pt is not qualified for this infusion at the [facility] infusion center due to symptoms greater than 10 days.  Message sent to Dr. Tommy Medal.

## 2019-10-18 NOTE — Telephone Encounter (Signed)
Urine culture result is still not back.

## 2019-10-18 NOTE — Telephone Encounter (Signed)
I have a feeling that's the case.  It looks like the ED attempted to add on the culture but maybe it was too late or sample inadequate.  If he actually has symptoms related to his urine, a repeat sample could be collected and brought to the lab here; however, if his symptoms are not frequency, urgency, dysuria, fever, lower abdominal pain or flank pain, would not recheck because we know he's bound to be colonized.

## 2019-10-18 NOTE — Telephone Encounter (Signed)
It doesn't look like the culture was done?? It's been 6 days and still no results. Please advise

## 2019-10-19 NOTE — Telephone Encounter (Signed)
Spoke with daughter Ebony Hail and pt is doing ok right now, he's on day 10 of Covid, seems to be doing ok per daughter.

## 2019-10-25 ENCOUNTER — Other Ambulatory Visit (HOSPITAL_COMMUNITY): Payer: Self-pay | Admitting: Interventional Radiology

## 2019-10-25 ENCOUNTER — Other Ambulatory Visit: Payer: Self-pay

## 2019-10-25 ENCOUNTER — Ambulatory Visit (HOSPITAL_COMMUNITY)
Admission: RE | Admit: 2019-10-25 | Discharge: 2019-10-25 | Disposition: A | Discharge status: Home / Self Care | Payer: Medicare Other | Source: Ambulatory Visit | Attending: Interventional Radiology | Admitting: Interventional Radiology

## 2019-10-25 DIAGNOSIS — Z434 Encounter for attention to other artificial openings of digestive tract: Secondary | ICD-10-CM | POA: Diagnosis not present

## 2019-10-25 DIAGNOSIS — K8050 Calculus of bile duct without cholangitis or cholecystitis without obstruction: Secondary | ICD-10-CM

## 2019-10-25 HISTORY — PX: IR CATHETER TUBE CHANGE: IMG717

## 2019-10-25 MED ORDER — LIDOCAINE HCL 1 % IJ SOLN
INTRAMUSCULAR | Status: AC
  Filled 2019-10-25: qty 20

## 2019-10-25 MED ORDER — IOHEXOL 300 MG/ML  SOLN
50.0000 mL | Freq: Once | INTRAMUSCULAR | Status: AC | PRN
  Administered 2019-10-25: 10 mL

## 2019-11-22 ENCOUNTER — Other Ambulatory Visit (HOSPITAL_COMMUNITY): Payer: Medicare Other

## 2019-11-23 ENCOUNTER — Other Ambulatory Visit (HOSPITAL_COMMUNITY): Payer: Medicare Other

## 2019-11-29 ENCOUNTER — Ambulatory Visit (HOSPITAL_COMMUNITY)
Admission: RE | Admit: 2019-11-29 | Discharge: 2019-11-29 | Disposition: A | Discharge status: Home / Self Care | Payer: Medicare Other | Source: Ambulatory Visit | Attending: Interventional Radiology | Admitting: Interventional Radiology

## 2019-11-29 ENCOUNTER — Other Ambulatory Visit (HOSPITAL_COMMUNITY): Payer: Self-pay | Admitting: Interventional Radiology

## 2019-11-29 ENCOUNTER — Other Ambulatory Visit: Payer: Self-pay

## 2019-11-29 DIAGNOSIS — Z434 Encounter for attention to other artificial openings of digestive tract: Secondary | ICD-10-CM | POA: Diagnosis not present

## 2019-11-29 DIAGNOSIS — K8050 Calculus of bile duct without cholangitis or cholecystitis without obstruction: Secondary | ICD-10-CM | POA: Insufficient documentation

## 2019-11-29 DIAGNOSIS — K831 Obstruction of bile duct: Secondary | ICD-10-CM | POA: Diagnosis not present

## 2019-11-29 DIAGNOSIS — Z4803 Encounter for change or removal of drains: Secondary | ICD-10-CM | POA: Insufficient documentation

## 2019-11-29 HISTORY — PX: IR CATHETER TUBE CHANGE: IMG717

## 2019-11-29 MED ORDER — IOHEXOL 300 MG/ML  SOLN
50.0000 mL | Freq: Once | INTRAMUSCULAR | Status: AC | PRN
  Administered 2019-11-29: 15:00:00 15 mL

## 2019-11-29 MED ORDER — LIDOCAINE HCL 1 % IJ SOLN
INTRAMUSCULAR | Status: DC | PRN
  Administered 2019-11-29: 5 mL

## 2019-11-29 MED ORDER — LIDOCAINE HCL 1 % IJ SOLN
INTRAMUSCULAR | Status: AC
  Filled 2019-11-29: qty 20

## 2019-11-29 NOTE — Procedures (Signed)
Pre procedural Dx: Acute cholecysitis Post procedural Dx: Same  Successful fluoroscopic guided exchange of existing 16 French cholecystostomy tube.  Complete occlusion of the neck of the gallbladder without opacification the cystic duct as was demonstrated on most recent routine exchange performed 10/25/2019.   EBL: None Complications: None immediate  PLAN:   - The patient's cholecystostomy tube was reconnected to a gravity bag, again with a 3 way stopcock interposed between the tube and the bag per the patient's family and caregiver's request. - As patient has tolerated extending the length of time between routine exchanges for 5 weeks, will attempt to extend the patient's next routine exchange for 6 weeks.   Ronny Bacon, MD Pager #: [phone]

## 2019-12-02 ENCOUNTER — Ambulatory Visit: Payer: Medicare Other | Admitting: Infectious Disease

## 2019-12-15 ENCOUNTER — Other Ambulatory Visit: Payer: Self-pay

## 2019-12-15 ENCOUNTER — Ambulatory Visit (INDEPENDENT_AMBULATORY_CARE_PROVIDER_SITE_OTHER): Payer: Medicare Other | Admitting: Infectious Disease

## 2019-12-15 ENCOUNTER — Encounter: Payer: Self-pay | Admitting: Infectious Disease

## 2019-12-15 DIAGNOSIS — Z95810 Presence of automatic (implantable) cardiac defibrillator: Secondary | ICD-10-CM | POA: Diagnosis not present

## 2019-12-15 DIAGNOSIS — U071 COVID-19: Secondary | ICD-10-CM

## 2019-12-15 DIAGNOSIS — K8050 Calculus of bile duct without cholangitis or cholecystitis without obstruction: Secondary | ICD-10-CM

## 2019-12-15 DIAGNOSIS — Z952 Presence of prosthetic heart valve: Secondary | ICD-10-CM

## 2019-12-15 DIAGNOSIS — F0390 Unspecified dementia without behavioral disturbance: Secondary | ICD-10-CM | POA: Diagnosis not present

## 2019-12-15 DIAGNOSIS — I350 Nonrheumatic aortic (valve) stenosis: Secondary | ICD-10-CM | POA: Diagnosis not present

## 2019-12-15 DIAGNOSIS — B9689 Other specified bacterial agents as the cause of diseases classified elsewhere: Secondary | ICD-10-CM | POA: Diagnosis not present

## 2019-12-15 DIAGNOSIS — R7881 Bacteremia: Secondary | ICD-10-CM | POA: Diagnosis not present

## 2019-12-15 DIAGNOSIS — K8309 Other cholangitis: Secondary | ICD-10-CM

## 2019-12-15 HISTORY — DX: COVID-19: U07.1

## 2019-12-15 NOTE — Progress Notes (Signed)
Virtual Visit via Telephone Note  I connected with William [name] on [birth year] at  1:45 PM EST  HCPO, his wife William [name], retail by telephone and verified that I am speaking with the correct person using two identifiers.  Location: Patient: Home Provider: [facility]   I discussed the limitations, risks, security and privacy concerns of performing an evaluation and management service by telephone and the availability of in person appointments. I also discussed with the patient's surrogate (his wife)  that there may be a patient responsible charge related to this service. The patients' surrogate expressed understanding and agreed to proceed.   History of Present Illness:  86 y.o.malewithrecurrent episodes of cholangitis and sepsis at times with bacteremias including a time when he had to have his ICD removed, again remitted admitted with cholangitis and sepsis in the context of an obstructed cholecystostomy tube, and concern for potential obstruction of his biliary tree due to stones.  He was found not to be a surgical candidate which seems quite reasonable to me given his multiple comorbidities including his dementia.  GI were able to remove multiple stones during his admission and cultures from the biliary tree yielded ampicillin sensitive enterococcal species and an Enterobacter species.  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  Patient was met with with palliative care on multiple occasions but his family continued to want "aggressive care".  They have been seeming to want to have a second opinion from a surgeon which seems not very realistic especially in the current environment.  Ultimately arrangements were made for him to continue on the meropenem and later changed to  Augmentin and Bactrim and has done  well.   He has been seen by interventional radiology which have changed his drain.  They cultured his bile which had  multiple calculi in it but it did not appear purulent or not documented as such. Culture was taken which I woud NOT have preferred UNLESS it was purulent. Cx growing GPC--VRE which we did not treat  Drain has been upsized and he has been seen at [facility] for consultation re means of removing as many of stones from biliary tree as possible without resorting to surgery  He had removal of further bile and stones via interventional radiology at [facility].  He was seen at [facility] and underwent 1. Percutaneous gallstone extraction.  2. Cystic duct recanalization and balloon dilation  3. Cholecystostomy tube exchange. on August the 4th, 2020  They found   1. Minimal residual cholelithiasis with successful clearance of the  remaining gallstones.  2. Focal stenosis and occlusion of the cystic duct with successful  recanalization and balloon dilation to 16m. The cystic duct is now widely  patent.  3. Choledocholithiasis without evidence of CBD obstruction.  4 Successful exchange of a new 16 french cholecystostomy tube.   Given the large amount of gallstones found in GB decision made to leave the cholecystostomy in place and have it exchanged every 3 months  I did  think we can trial him off of antibiotics this visit  The family wanted and have stopped the ACTIGALl though I would recommend continuing it. They are to talk with GI re this.  JImirhad in the interim developed swelling of what sounds like his parotid gland that is been present for roughly 10 days.  IIt did  not improved he does not have fevers chills confusion or other systemic symptoms.  I was concerned however he could have a MRSA infection in this area that might be resistant to the Bactrim that he is been taking chronically. I placed him on doxycycline and this resolved.   Since then he developed some sleepiness along with a low-grade fever.  His urine has become darker and they had send it for  analysis and culture with his primary care physician.  The organism isolated was fairly unusual 1 and was resistant to most antimicrobials that were orally available other than Macrobid.  He was initially prescribed Bactrim but then later Macrobid and apparently the urine "cleared up.  However his sleepiness has persisted may have even worsened in the last week.  He is also developed a pressure ulcer on his sacral area that was evaluated by wound care   He was seen by interventional radiology and who changed his drain and noted that he had now obstruction of his cystic duct by a stone.  I was apprehensive that he may be harboringInfection now at this obstructed site that could put him at risk for recurrent cholangitis and worse case scenario recurrent bacteremia.  I arrangde for him to be seen in clinic in late October 2020.  On exam in clinic he is fairly somnolent wearing a mask could  barely answer any questions and is wearing mittens to restrain him from pulling out his drain.   Since my last phone visit with William [name] he had begun having fevers in late December and was thought to potentially be having recurrence of cholangitis and his home health nurse started him on antibiotics orally again.  It turned out that this was the beginning of COVID-19 infection.  Both he and his wife tested positive.  I was able to help his wife get in contact with the monoclonal antibody infusion clinic and she received a dose of monoclonal antibodies.  They were not able to get William [name] out of the house to get him infused with antibodies.  He also was evaluated in the ER but felt stable for discharge back to home and he and his wife both recovered from this infection at home.  He has had no further evidence of recurrent cholangitis.  His drain is again been change in the frequency of pain exchanges been changed every 5 weeks.  The patient has been comfortable and having no complaints recently.  He continues  remain off antibiotics which I think is prudent.       Past Medical History:  Diagnosis Date  . AAA (abdominal aortic aneurysm) ([place])    7/13 3.8cm  . Arthritis    "joints" (01/11/2014)  . Asthma    "seasonal; some foods"   . Chronic systolic CHF (congestive heart failure) ([place])   . Dementia ([place])   . HLD (hyperlipidemia)   . HOH (hard of hearing)   . Lumbar vertebral fracture ([facility])    L1- 04/11/2014   . Parotitis 06/10/2019  . Prostate cancer ([place])   . S/P ICD (internal cardiac defibrillator) procedure, 01/11/14 removal of ERI gen and placement of Medtronic Evera XT VR & NEW Right ventricular lead Medtronic 01/12/2014  . S/P TAVR (transcatheter aortic valve replacement)    Edwards Sapien 3 THV (size 29 mm, model # [id number], serial # [id number])  . Severe aortic stenosis   . Sleep apnea    "lost 60# & don't have it anymore" (01/11/2014)  . Somnolence 08/04/2019  . UTI (urinary tract infection) 08/04/2019  . Ventricular tachycardia ([place])  Past Surgical History:  Procedure Laterality Date  . CARDIAC CATHETERIZATION  08/20/2004   noncritical CAD,mild global hypokinesis, EF 50%  . CARDIAC DEFIBRILLATOR PLACEMENT  08/23/2004   Medtronic  . CATARACT EXTRACTION W/ INTRAOCULAR LENS IMPLANT Right   . COLECTOMY  1990's  . CRYOABLATION N/A 02/24/2014   Procedure: CRYO ABLATION PROSTATE;  Surgeon: Ailene Rud, MD;  Location: [facility];  Service: Urology;  Laterality: N/A;  . ERCP N/A 01/29/2019   Procedure: ENDOSCOPIC RETROGRADE CHOLANGIOPANCREATOGRAPHY (ERCP);  Surgeon: Milus Banister, MD;  Location: [facility] ENDOSCOPY;  Service: Endoscopy;  Laterality: N/A;  . HERNIA REPAIR     "abdomen; from colon OR"  . ICD LEAD REMOVAL N/A 09/15/2018   Procedure: ICD LEAD REMOVAL EXTRACTION;  Surgeon: Evans Lance, MD;  Location: [facility] OR;  Service: Cardiovascular;  Laterality: N/A;  DR. BARTLE TO BACK UP  . IMPLANTABLE CARDIOVERTER DEFIBRILLATOR (ICD) GENERATOR CHANGE N/A 01/11/2014   Procedure:  ICD GENERATOR CHANGE;  Surgeon: Sanda Klein, MD;  Location: [place] CATH LAB;  Service: Cardiovascular;  Laterality: N/A;  . IR CATHETER TUBE CHANGE  03/01/2019  . IR CATHETER TUBE CHANGE  04/05/2019  . IR CATHETER TUBE CHANGE  07/05/2019  . IR CATHETER TUBE CHANGE  08/02/2019  . IR CATHETER TUBE CHANGE  08/30/2019  . IR CATHETER TUBE CHANGE  09/27/2019  . IR CATHETER TUBE CHANGE  10/25/2019  . IR CATHETER TUBE CHANGE  11/29/2019  . IR EXCHANGE BILIARY DRAIN  12/18/2018  . IR EXCHANGE BILIARY DRAIN  01/05/2019  . IR EXCHANGE BILIARY DRAIN  01/25/2019  . IR PERC CHOLECYSTOSTOMY  11/22/2018  . IR RADIOLOGIST EVAL & MGMT  02/26/2019  . IR RADIOLOGIST EVAL & MGMT  03/17/2019  . IR SINUS/FIST TUBE CHK-NON GI  01/28/2019  . LEAD REVISION N/A 01/11/2014   Procedure: LEAD REVISION;  Surgeon: Sanda Klein, MD;  Location: [place] CATH LAB;  Service: Cardiovascular;  Laterality: N/A;  . NM MYOCAR PERF WALL MOTION  01/29/2012   abnormal c/o infarct/scar,no ischemia present  . PROSTATE BIOPSY N/A 11/22/2013   Procedure: PROSTATE BIOPSY AND ULTRASOUND;  Surgeon: Ailene Rud, MD;  Location: [facility];  Service: Urology;  Laterality: N/A;  . REMOVAL OF STONES  01/29/2019   Procedure: REMOVAL OF STONES;  Surgeon: Milus Banister, MD;  Location: [facility] ENDOSCOPY;  Service: Endoscopy;;  . RIGHT/LEFT HEART CATH AND CORONARY ANGIOGRAPHY N/A 07/06/2018   Procedure: RIGHT/LEFT HEART CATH AND CORONARY ANGIOGRAPHY;  Surgeon: Troy Sine, MD;  Location: [place] CV LAB;  Service: Cardiovascular;  Laterality: N/A;  . SPHINCTEROTOMY  01/29/2019   Procedure: SPHINCTEROTOMY;  Surgeon: Milus Banister, MD;  Location: [facility] ENDOSCOPY;  Service: Endoscopy;;  . TEE WITHOUT CARDIOVERSION N/A 08/18/2018   Procedure: TRANSESOPHAGEAL ECHOCARDIOGRAM (TEE);  Surgeon: Burnell Blanks, MD;  Location: [place];  Service: Open Heart Surgery;  Laterality: N/A;  . TEE WITHOUT CARDIOVERSION N/A 09/08/2018   Procedure: TRANSESOPHAGEAL  ECHOCARDIOGRAM (TEE);  Surgeon: Lelon Perla, MD;  Location: [facility] ENDOSCOPY;  Service: Cardiovascular;  Laterality: N/A;  . TEE WITHOUT CARDIOVERSION N/A 09/15/2018   Procedure: TRANSESOPHAGEAL ECHOCARDIOGRAM (TEE);  Surgeon: Evans Lance, MD;  Location: [facility] OR;  Service: Cardiovascular;  Laterality: N/A;  . TEE WITHOUT CARDIOVERSION N/A 11/26/2018   Procedure: TRANSESOPHAGEAL ECHOCARDIOGRAM (TEE);  Surgeon: Jerline Pain, MD;  Location: [facility] ENDOSCOPY;  Service: Cardiovascular;  Laterality: N/A;  . TRANSCATHETER AORTIC VALVE REPLACEMENT, TRANSFEMORAL  08/18/2018  . TRANSCATHETER AORTIC VALVE REPLACEMENT, TRANSFEMORAL N/A 08/18/2018  Procedure: TRANSCATHETER AORTIC VALVE REPLACEMENT, TRANSFEMORAL using an Edwards 56m Aortic Valve;  Surgeon: MBurnell Blanks MD;  [place] [facility]  Service: Open Heart Surgery;  Laterality: N/A;    Family History  Problem Relation Age of Onset  . Heart failure Mother        Died of "old age" at 951 . Pneumonia Father       Social History   Socioeconomic History  . Marital status: Married    Spouse name: Not on file  . Number of children: 2  . Years of education: Not on file  . Highest education level: Not on file  Occupational History  . Occupation: Owned a rScientist, [employer]"  Tobacco Use  . Smoking status: Never Smoker  . Smokeless tobacco: Never Used  Substance and Sexual Activity  . Alcohol use: Not Currently    Alcohol/week: 0.0 standard drinks    Comment: 01/11/2014 "drink 1/2 of a beer twice per month   . Drug use: No  . Sexual activity: Not Currently  Other Topics Concern  . Not on file  Social History Narrative  . Not on file   Social Determinants of Health   Financial Resource Strain:   . Difficulty of Paying Living Expenses: Not on file  Food Insecurity:   . Worried About RCharity fundraiserin the Last Year: Not on file  . Ran Out of Food in the Last Year: Not on file  Transportation Needs:   . Lack of  Transportation (Medical): Not on file  . Lack of Transportation (Non-Medical): Not on file  Physical Activity:   . Days of Exercise per Week: Not on file  . Minutes of Exercise per Session: Not on file  Stress:   . Feeling of Stress : Not on file  Social Connections:   . Frequency of Communication with Friends and Family: Not on file  . Frequency of Social Gatherings with Friends and Family: Not on file  . Attends Religious Services: Not on file  . Active Member of Clubs or Organizations: Not on file  . Attends CArchivistMeetings: Not on file  . Marital Status: Not on file    Allergies  Allergen Reactions  . Crestor [Rosuvastatin] Other (See Comments)    Hurts muscles  . Lipitor [Atorvastatin] Other (See Comments)    Hurts stomach  . Shrimp [Shellfish Allergy] Other (See Comments)    On MAR     Current Outpatient Medications:  .  acetaminophen (TYLENOL) 500 MG tablet, Take 500 mg by mouth every 6 (six) hours as needed (for pain). , Disp: , Rfl:  .  amiodarone (PACERONE) 200 MG tablet, Take 1 tablet (200 mg total) by mouth daily., Disp: 45 tablet, Rfl: 3 .  aspirin EC 81 MG tablet, Take 81 mg by mouth daily., Disp: , Rfl:  .  carvedilol (COREG) 3.125 MG tablet, Take by mouth. Wife give to him when his heart rate is high. prn, Disp: , Rfl:  .  cholecalciferol (VITAMIN D) 1000 units tablet, Take 2,000 Units by mouth at bedtime., Disp: , Rfl:  .  Cranberry-Vit C-Lactobacillus (RA CRANBERRY SUPPLEMENTS PO), Take by mouth., Disp: , Rfl:  .  loratadine (CLARITIN) 10 MG tablet, Take 10 mg by mouth daily., Disp: , Rfl:  .  Multiple Vitamins-Minerals (PRESERVISION AREDS 2 PO), Take by mouth. Take one tablet by mouth dailey, Disp: , Rfl:  .  polyethylene glycol powder (GLYCOLAX/MIRALAX) 17 GM/SCOOP powder, MIX 17 GRAMS IN 8  OUNCES OF WATER EVERY DAY., Disp: 1020 g, Rfl: 6 .  potassium chloride (KLOR-CON) 10 MEQ tablet, Take 1 tablet (10 mEq total) by mouth daily., Disp: 90  tablet, Rfl: 1 .  ursodiol (ACTIGALL) 300 MG capsule, Take 2 capsules (600 mg total) by mouth 2 (two) times daily., Disp: 360 capsule, Rfl: 3   Review of Systems  Unable to perform ROS: Dementia           Assessment & Plan:     Observations/Objective:  Mr. Glennon Hamilton appears to be doing relatively stable off antibiotics with proper drainage from his biliary tract   His wife did succumb to COVID-19 infection but I have successfully recovered  Assessment and Plan:  COVID-19 infection: He and his wife have recovered.  She had questions about vaccination post recovery from Covid infection.  My own opinion which differs from official recommendations is that individuals who have recovered from natural COVID-19 infection should forego the vaccine so that those who have never been vaccinated can have a vaccine.  I did also discuss with his wife the fact that in terms of looking at surrogate markers of protective the bodies that the levels of antibodies achieved after 1 dose of the mRNA back based vaccines and people who survived Covid are the same as for those who never had infection and had 2 vaccines.  Therefore I recommended that if they were to get vaccinated simply 1 vaccine would be more than enough.  He will be difficult for them to arrange for Daronte to get a vaccine since he is still housebound.    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  He will continue to have drain exchange and we have scheduled a telephone visit visit again in June     .Follow Up Instructions:    I discussed the assessment and treatment plan with the patient. The patient was provided an opportunity to ask questions and all were answered. The patient agreed with the plan and demonstrated an understanding of the instructions.   The patient was advised to  call back or seek an in-person evaluation if the symptoms worsen or if the condition fails to improve as anticipated.  I provided 21 minutes of non-face-to-face time during this encounter.   Alcide Evener, MD

## 2019-12-27 ENCOUNTER — Other Ambulatory Visit (HOSPITAL_COMMUNITY): Payer: Medicare Other

## 2019-12-29 MED FILL — NORMAL SALINE FLUSH SYRINGE: 0.9 | 22 days supply | Qty: 900 | Fill #5

## 2020-01-07 ENCOUNTER — Other Ambulatory Visit (HOSPITAL_COMMUNITY): Payer: Medicare Other

## 2020-01-10 ENCOUNTER — Other Ambulatory Visit (HOSPITAL_COMMUNITY): Payer: Medicare Other

## 2020-01-13 ENCOUNTER — Ambulatory Visit (HOSPITAL_COMMUNITY)
Admission: RE | Admit: 2020-01-13 | Discharge: 2020-01-13 | Disposition: A | Discharge status: Home / Self Care | Payer: Medicare Other | Source: Ambulatory Visit | Attending: Interventional Radiology | Admitting: Interventional Radiology

## 2020-01-13 ENCOUNTER — Other Ambulatory Visit: Payer: Self-pay

## 2020-01-13 ENCOUNTER — Other Ambulatory Visit (HOSPITAL_COMMUNITY): Payer: Self-pay | Admitting: Interventional Radiology

## 2020-01-13 DIAGNOSIS — Z4803 Encounter for change or removal of drains: Secondary | ICD-10-CM | POA: Insufficient documentation

## 2020-01-13 DIAGNOSIS — K8050 Calculus of bile duct without cholangitis or cholecystitis without obstruction: Secondary | ICD-10-CM

## 2020-01-13 DIAGNOSIS — K8044 Calculus of bile duct with chronic cholecystitis without obstruction: Secondary | ICD-10-CM | POA: Diagnosis not present

## 2020-01-13 HISTORY — PX: IR EXCHANGE BILIARY DRAIN: IMG6046

## 2020-01-13 MED ORDER — LIDOCAINE HCL 1 % IJ SOLN
INTRAMUSCULAR | Status: AC | PRN
  Administered 2020-01-13: 10 mL via INTRADERMAL

## 2020-01-13 MED ORDER — IOHEXOL 300 MG/ML  SOLN
50.0000 mL | Freq: Once | INTRAMUSCULAR | Status: AC | PRN
  Administered 2020-01-13: 16:00:00 5 mL

## 2020-01-13 MED ORDER — LIDOCAINE HCL 1 % IJ SOLN
INTRAMUSCULAR | Status: AC
  Filled 2020-01-13: qty 20

## 2020-01-14 ENCOUNTER — Other Ambulatory Visit (HOSPITAL_COMMUNITY): Payer: Self-pay | Admitting: Interventional Radiology

## 2020-01-14 ENCOUNTER — Encounter (HOSPITAL_COMMUNITY): Payer: Self-pay

## 2020-01-14 DIAGNOSIS — K8050 Calculus of bile duct without cholangitis or cholecystitis without obstruction: Secondary | ICD-10-CM

## 2020-01-31 ENCOUNTER — Telehealth: Payer: Self-pay | Admitting: Gastroenterology

## 2020-01-31 NOTE — Telephone Encounter (Signed)
Pt's wife reported that [employer] requires a PA for ursodiol.

## 2020-01-31 NOTE — Telephone Encounter (Signed)
Prior authorization has been submitted.  Will follow up on this at a later time.

## 2020-02-01 NOTE — Telephone Encounter (Signed)
Left message for patient and his wife Webb Silversmith that Linard Millers has been approved from 01-31-20 - 01-30-21.  Advised to return call with any questions or concerns.  Copy of approval letter faxed to [place].

## 2020-02-06 IMAGING — DX DG CHEST 1V PORT
1 series · 2 of 2 positions shown · non-contrast
Comparison: 09/16/2018

CLINICAL DATA: Sepsis.

EXAM:
PORTABLE CHEST 1 VIEW

[Series 1: chest ap · 0.14mm/px · 2 of 2 slices shown]
[im 1/2]
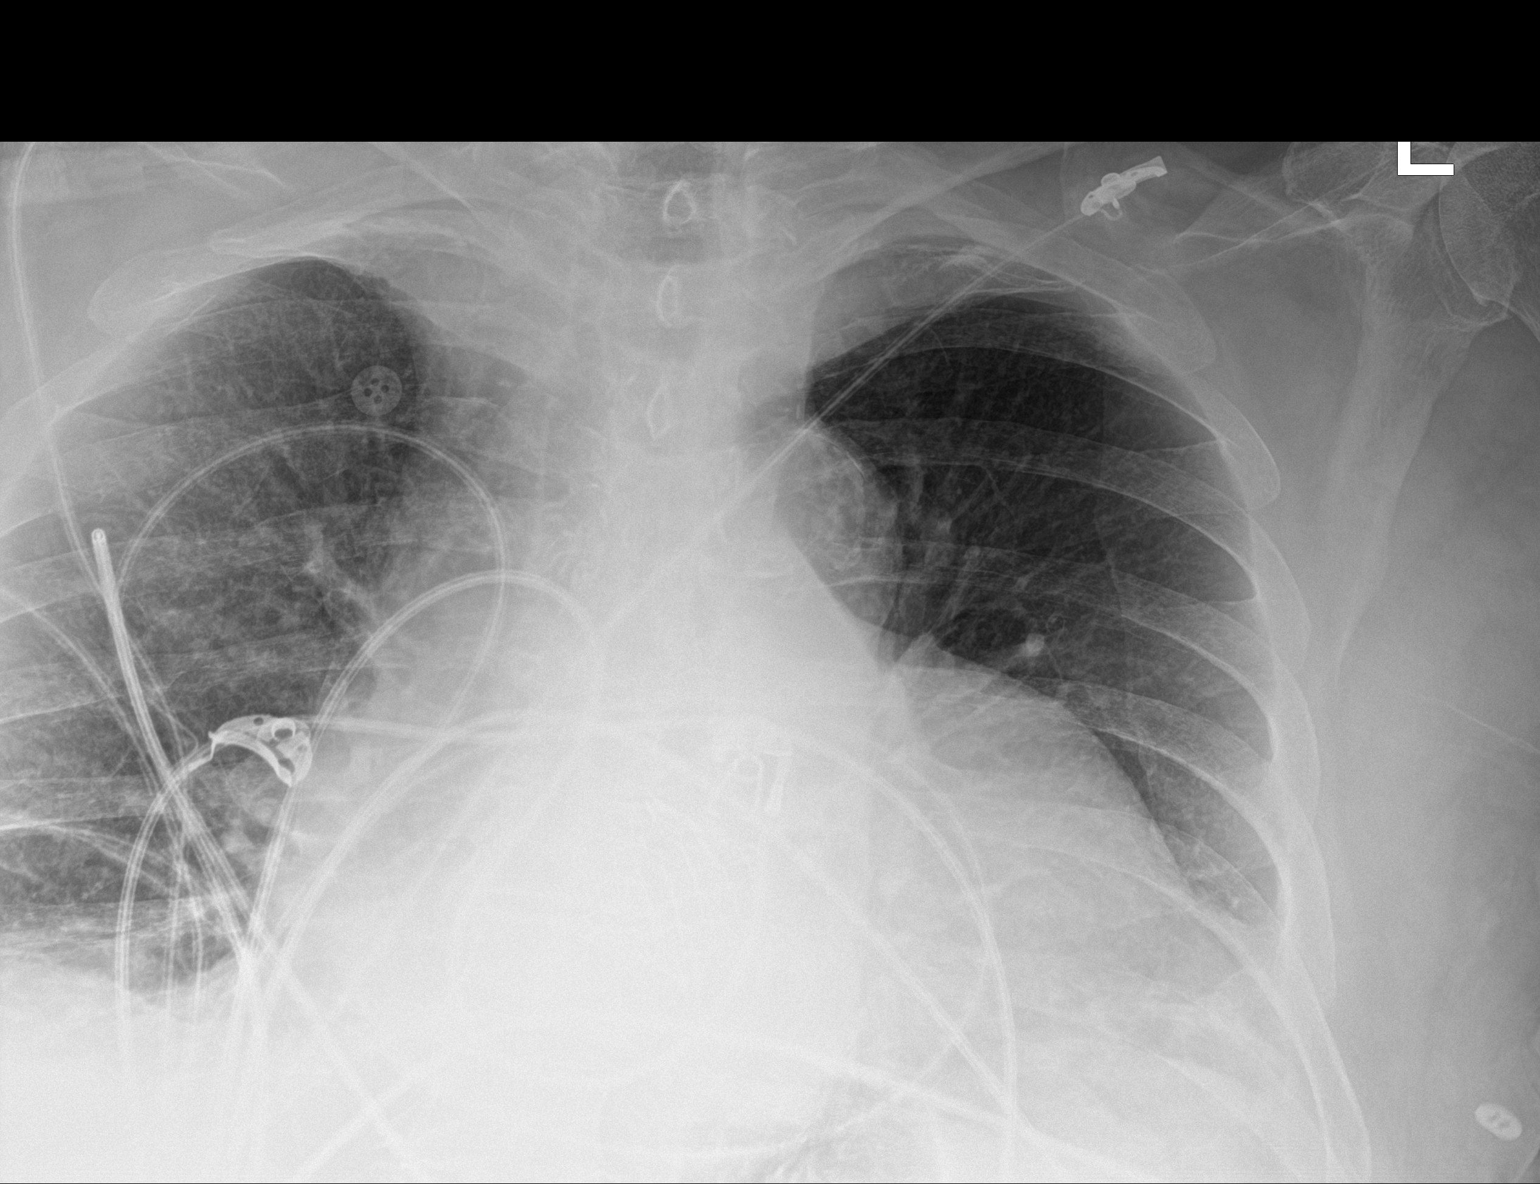
[im 2/2]
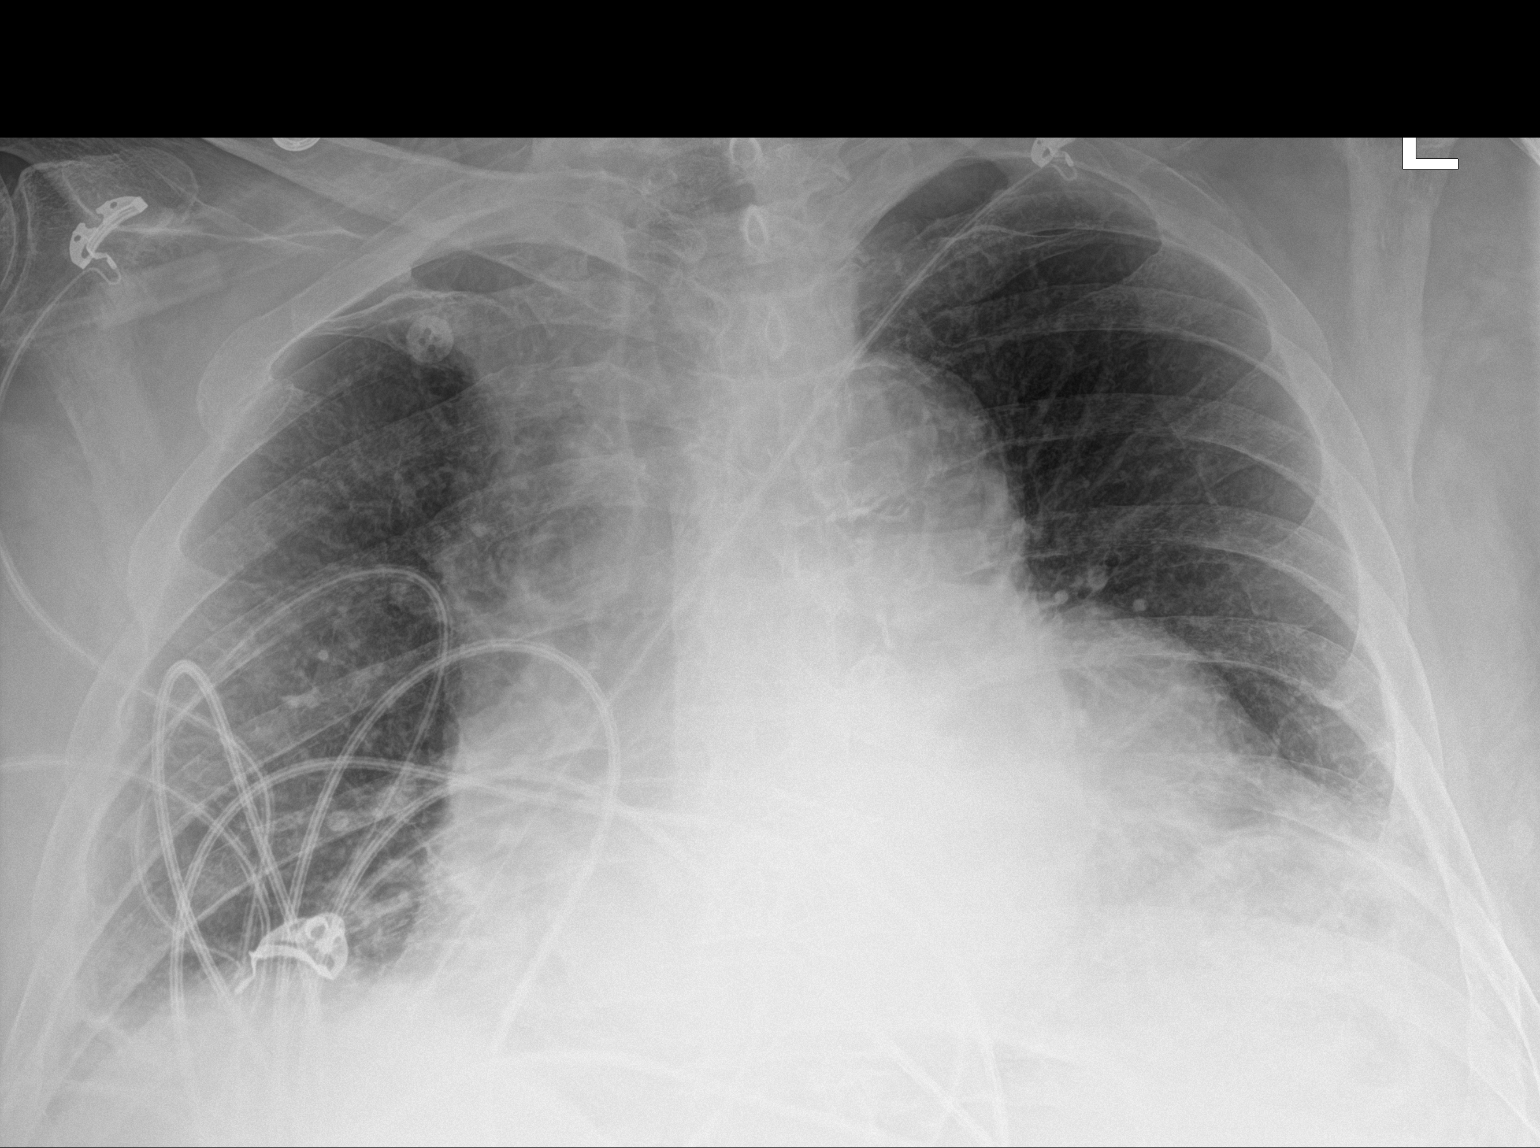

[2 of 2 positions shown; findings below may reference images not displayed]

FINDINGS: The heart is enlarged but stable. Marked tortuosity and
calcification of the thoracic aorta. Bibasilar atelectasis. No
definite pleural effusions. No obvious infiltrates.
IMPRESSION: Cardiac enlargement and marked tortuosity and calcification of the
thoracic aorta.

No definite infiltrates, effusions or edema. Streaky basilar
atelectasis.

## 2020-02-07 IMAGING — XA IR CHOLECYSTOSTOMY
2 series · 3 of 3 positions shown · non-contrast
Comparison: none

INDICATION: 89-year-old male with a history of cholecystitis

[Series 1: fl (-) angio · 1 of 1 slices shown (1 of 2)]
[im 1/1]
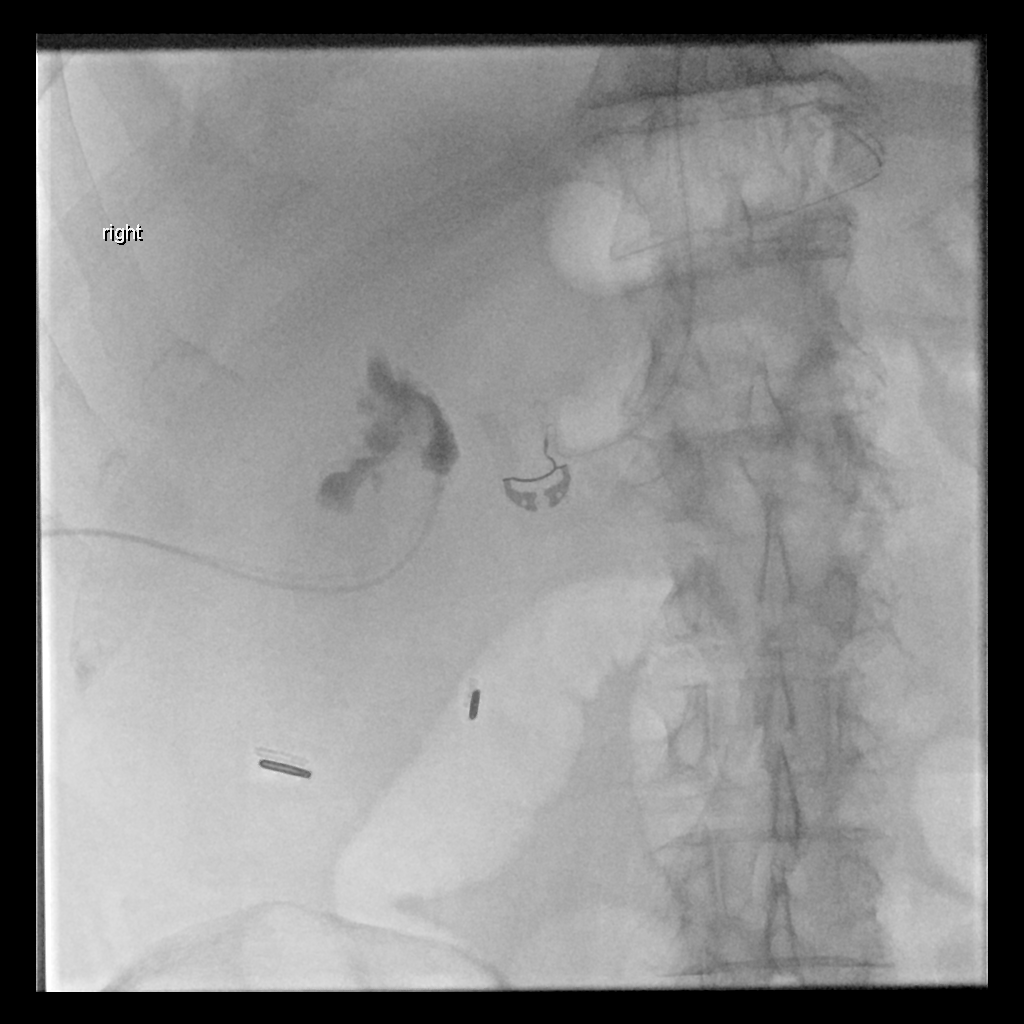

[Series 2: fl (-) angio · 2 of 2 slices shown (2 of 2)]
[im 1/2]
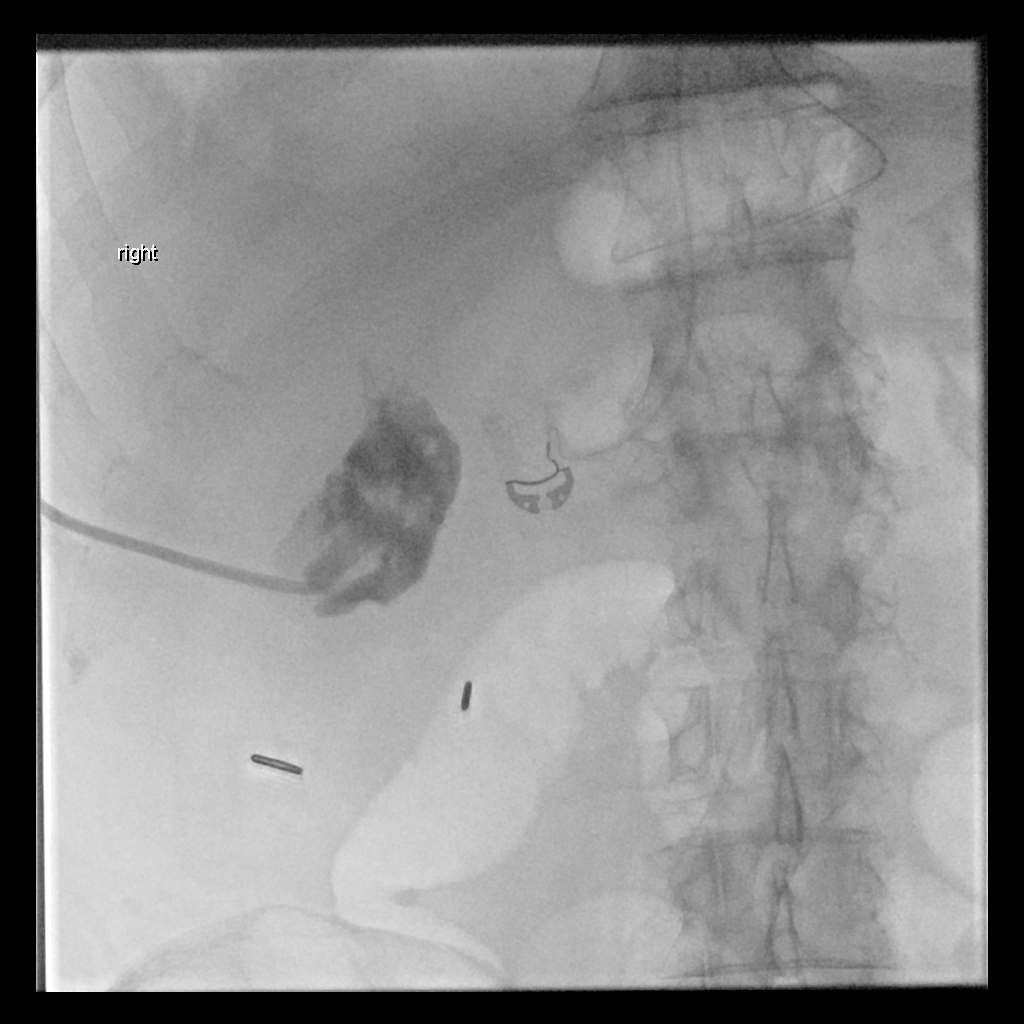
[im 2/2]
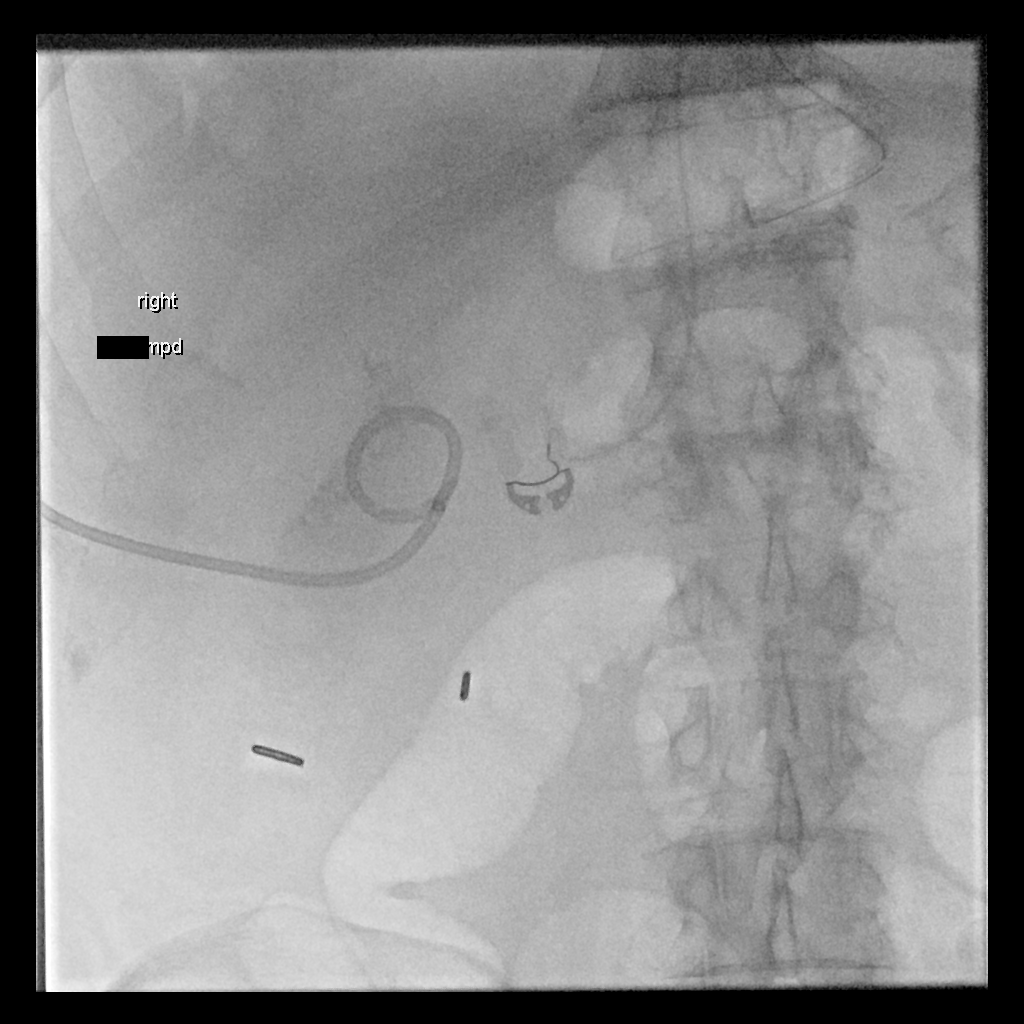

[3 of 3 positions shown; findings below may reference images not displayed]

EXAM:
CHOLECYSTOSTOMY

MEDICATIONS:
In-hospital antibiotics

ANESTHESIA/SEDATION:
Moderate (conscious) sedation was employed during this procedure. A
total of Versed 2.0 mg and Fentanyl 0 mcg was administered
intravenously.

Moderate Sedation Time: 0 minutes. The patient's level of
consciousness and vital signs were monitored continuously by
radiology nursing throughout the procedure under my direct
supervision.

FLUOROSCOPY TIME:  Fluoroscopy Time: 0 minutes 24 seconds (42.6
mGy).

COMPLICATIONS:
None

PROCEDURE:
Informed written consent was obtained from the patient and the
patient's family after a thorough discussion of the procedural
risks, benefits and alternatives. All questions were addressed.
Maximal Sterile Barrier Technique was utilized including caps, mask,
sterile gowns, sterile gloves, sterile drape, hand hygiene and skin
antiseptic. A timeout was performed prior to the initiation of the
procedure.

Ultrasound survey of the right upper quadrant was performed for
planning purposes.

Once the patient is prepped and draped in the usual sterile fashion,
the skin and subcutaneous tissues overlying the gallbladder were
generously infiltrated 1% lidocaine for local anesthesia.

A coaxial needle was advanced under ultrasound guidance through the
skin subcutaneous tissues and a small segment of liver into the
gallbladder lumen.

With removal of the stylet, spontaneous dark bile drainage occurred.

Using modified Seldinger technique, a 10 French drain was placed
into the gallbladder fossa, with aspiration of the sample for the
lab.

Contrast injection confirmed position of the tube within the
gallbladder lumen.

Drainage catheter was attached to gravity drain with a suture
retention placed.

Patient tolerated the procedure well and remained hemodynamically
stable throughout.

No complications were encountered and no significant blood loss
encountered.
IMPRESSION: Status post image guided percutaneous cholecystostomy.

## 2020-02-28 ENCOUNTER — Other Ambulatory Visit: Payer: Self-pay

## 2020-02-28 ENCOUNTER — Other Ambulatory Visit (HOSPITAL_COMMUNITY): Payer: Self-pay | Admitting: Interventional Radiology

## 2020-02-28 ENCOUNTER — Ambulatory Visit (HOSPITAL_COMMUNITY)
Admission: RE | Admit: 2020-02-28 | Discharge: 2020-02-28 | Disposition: A | Discharge status: Home / Self Care | Payer: Medicare Other | Source: Ambulatory Visit | Attending: Interventional Radiology | Admitting: Interventional Radiology

## 2020-02-28 DIAGNOSIS — K8050 Calculus of bile duct without cholangitis or cholecystitis without obstruction: Secondary | ICD-10-CM

## 2020-02-28 DIAGNOSIS — Z4803 Encounter for change or removal of drains: Secondary | ICD-10-CM | POA: Diagnosis not present

## 2020-02-28 DIAGNOSIS — K831 Obstruction of bile duct: Secondary | ICD-10-CM | POA: Diagnosis not present

## 2020-02-28 DIAGNOSIS — Z4659 Encounter for fitting and adjustment of other gastrointestinal appliance and device: Secondary | ICD-10-CM | POA: Diagnosis not present

## 2020-02-28 HISTORY — PX: IR EXCHANGE BILIARY DRAIN: IMG6046

## 2020-02-28 MED ORDER — LIDOCAINE HCL 1 % IJ SOLN
INTRAMUSCULAR | Status: DC | PRN
  Administered 2020-02-28: 5 mL

## 2020-02-28 MED ORDER — IOHEXOL 300 MG/ML  SOLN
50.0000 mL | Freq: Once | INTRAMUSCULAR | Status: AC | PRN
  Administered 2020-02-28: 10 mL

## 2020-02-28 MED ORDER — LIDOCAINE HCL 1 % IJ SOLN
INTRAMUSCULAR | Status: AC
  Filled 2020-02-28: qty 20

## 2020-03-02 ENCOUNTER — Other Ambulatory Visit (HOSPITAL_COMMUNITY): Payer: Medicare Other

## 2020-04-03 ENCOUNTER — Ambulatory Visit: Payer: Medicare Other | Admitting: Infectious Disease

## 2020-04-03 ENCOUNTER — Other Ambulatory Visit: Payer: Self-pay

## 2020-04-03 MED ORDER — AMIODARONE HCL 200 MG PO TABS: 200.0000 mg | ORAL_TABLET | Freq: Every day | ORAL | 3 refills | Status: DC

## 2020-04-04 ENCOUNTER — Other Ambulatory Visit: Payer: Self-pay

## 2020-04-04 ENCOUNTER — Telehealth (INDEPENDENT_AMBULATORY_CARE_PROVIDER_SITE_OTHER): Payer: Medicare Other | Admitting: Infectious Disease

## 2020-04-04 DIAGNOSIS — R7881 Bacteremia: Secondary | ICD-10-CM | POA: Diagnosis not present

## 2020-04-04 DIAGNOSIS — Z95810 Presence of automatic (implantable) cardiac defibrillator: Secondary | ICD-10-CM | POA: Diagnosis not present

## 2020-04-04 DIAGNOSIS — T827XXD Infection and inflammatory reaction due to other cardiac and vascular devices, implants and grafts, subsequent encounter: Secondary | ICD-10-CM

## 2020-04-04 DIAGNOSIS — K8309 Other cholangitis: Secondary | ICD-10-CM

## 2020-04-04 DIAGNOSIS — F0390 Unspecified dementia without behavioral disturbance: Secondary | ICD-10-CM

## 2020-04-04 DIAGNOSIS — Z952 Presence of prosthetic heart valve: Secondary | ICD-10-CM

## 2020-04-04 DIAGNOSIS — K8064 Calculus of gallbladder and bile duct with chronic cholecystitis without obstruction: Secondary | ICD-10-CM | POA: Diagnosis not present

## 2020-04-04 DIAGNOSIS — B952 Enterococcus as the cause of diseases classified elsewhere: Secondary | ICD-10-CM | POA: Diagnosis not present

## 2020-04-04 DIAGNOSIS — B9689 Other specified bacterial agents as the cause of diseases classified elsewhere: Secondary | ICD-10-CM

## 2020-04-04 DIAGNOSIS — U071 COVID-19: Secondary | ICD-10-CM | POA: Diagnosis not present

## 2020-04-04 NOTE — Progress Notes (Signed)
Virtual Visit via Telephone Note  I connected with William [name] and his wife on 04/04/20 at 11:00 AM EDT  HCPO, his wife [name], retail by telephone and verified that I am speaking with the correct person using two identifiers.  Location: Patient: Home Provider: [facility]   I discussed the limitations, risks, security and privacy concerns of performing an evaluation and management service by telephone and the availability of in person appointments. I also discussed with the patient's surrogate (his wife)  that there may be a patient responsible charge related to this service. The patients' surrogate expressed understanding and agreed to proceed.   History of Present Illness:  86 y.o.malewithrecurrent episodes of cholangitis and sepsis at times with bacteremias including a time when he had to have his ICD removed, again remitted admitted with cholangitis and sepsis in the context of an obstructed cholecystostomy tube, and concern for potential obstruction of his biliary tree due to stones.  He was found not to be a surgical candidate which seems quite reasonable to me given his multiple comorbidities including his dementia.  GI were able to remove multiple stones during his admission and cultures from the biliary tree yielded ampicillin sensitive enterococcal species and an Enterobacter species.  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  Patient was met with with palliative care on multiple occasions but his family continued to want "aggressive care".  They have been seeming to want to have a second opinion from a surgeon which seems not very realistic especially in the current environment.  Ultimately arrangements were made for him to continue on the meropenem and later changed to  Augmentin and Bactrim and has done  well.   He has been seen by interventional radiology which have changed his drain.  They cultured his bile  which had multiple calculi in it but it did not appear purulent or not documented as such. Culture was taken which I woud NOT have preferred UNLESS it was purulent. Cx growing GPC--VRE which we did not treat  Drain has been upsized and he has been seen at [facility] for consultation re means of removing as many of stones from biliary tree as possible without resorting to surgery  He had removal of further bile and stones via interventional radiology at [facility].  He was seen at [facility] and underwent 1. Percutaneous gallstone extraction.  2. Cystic duct recanalization and balloon dilation  3. Cholecystostomy tube exchange. on August the 4th, 2020  They found   1. Minimal residual cholelithiasis with successful clearance of the  remaining gallstones.  2. Focal stenosis and occlusion of the cystic duct with successful  recanalization and balloon dilation to 47m. The cystic duct is now widely  patent.  3. Choledocholithiasis without evidence of CBD obstruction.  4 Successful exchange of a new 16 french cholecystostomy tube.   Given the large amount of gallstones found in GB decision made to leave the cholecystostomy in place and have it exchanged every 3 months  I did  think we can trial him off of antibiotics this visit  The family wanted and have stopped the ACTIGALl though I would recommend continuing it. They are to talk with GI re this.  JRustinhad in the interim developed swelling of what sounds like his parotid gland that is been present for roughly 10 days.  IIt did  not improved he does not have fevers chills confusion or other systemic  symptoms.  I was concerned however he could have a MRSA infection in this area that might be resistant to the Bactrim that he is been taking chronically. I placed him on doxycycline and this resolved.   Since then he developed some sleepiness along with a low-grade fever.  His urine has become darker and they had  send it for analysis and culture with his primary care physician.  The organism isolated was fairly unusual 1 and was resistant to most antimicrobials that were orally available other than Macrobid.  He was initially prescribed Bactrim but then later Macrobid and apparently the urine "cleared up.  However his sleepiness has persisted may have even worsened in the last week.  He is also developed a pressure ulcer on his sacral area that was evaluated by wound care   He was seen by interventional radiology and who changed his drain and noted that he had now obstruction of his cystic duct by a stone.  I was apprehensive that he may be harboringInfection now at this obstructed site that could put him at risk for recurrent cholangitis and worse case scenario recurrent bacteremia.  I arrangde for him to be seen in clinic in late October 2020.  On exam in clinic he is fairly somnolent wearing a mask could  barely answer any questions and is wearing mittens to restrain him from pulling out his drain.   Since my last phone visit with William [name] he had begun having fevers in late December and was thought to potentially be having recurrence of cholangitis and his home health nurse started him on antibiotics orally again.  It turned out that this was the beginning of COVID-19 infection.  Both he and his wife tested positive.  I was able to help his wife get in contact with the monoclonal antibody infusion clinic and she received a dose of monoclonal antibodies.  They were not able to get William [name] out of the house to get him infused with antibodies.  He also was evaluated in the ER but felt stable for discharge back to home and he and his wife both recovered from this infection at home.  He  had no further evidence of recurrent cholangitis.  His drain is again been change in the frequency of pain exchanges been changed every 5 weeks.  When I last saw him virtually was in March 2021.  Since then he had not use  antibiotic except he did have some low-grade temperatures roughly a week before his biliary drain was changed and on for that week he did take both Bactrim and levofloxacin.  Since then he is not use any antibiotics.  His wife is wondering whether he and she can get vaccinated for COVID-19 have strongly encouraged them both to be vaccinated though it will be difficult for Chris to get at the time he does is not able to get out of his house very easily.    96.8 85 POX 98% 118/76   Past Medical History:  Diagnosis Date  . AAA (abdominal aortic aneurysm) ([place])    7/13 3.8cm  . Arthritis    "joints" (01/11/2014)  . Asthma    "seasonal; some foods"   . Chronic systolic CHF (congestive heart failure) ([place])   . COVID-19 12/15/2019  . Dementia ([place])   . HLD (hyperlipidemia)   . HOH (hard of hearing)   . Lumbar vertebral fracture ([facility])    L1- 04/11/2014   . Parotitis 06/10/2019  . Prostate cancer ([place])   .  S/P ICD (internal cardiac defibrillator) procedure, 01/11/14 removal of ERI gen and placement of Medtronic Evera XT VR & NEW Right ventricular lead Medtronic 01/12/2014  . S/P TAVR (transcatheter aortic valve replacement)    Edwards Sapien 3 THV (size 29 mm, model # [id number], serial # [id number])  . Severe aortic stenosis   . Sleep apnea    "lost 60# & don't have it anymore" (01/11/2014)  . Somnolence 08/04/2019  . UTI (urinary tract infection) 08/04/2019  . Ventricular tachycardia [facility])     Past Surgical History:  Procedure Laterality Date  . CARDIAC CATHETERIZATION  08/20/2004   noncritical CAD,mild global hypokinesis, EF 50%  . CARDIAC DEFIBRILLATOR PLACEMENT  08/23/2004   Medtronic  . CATARACT EXTRACTION W/ INTRAOCULAR LENS IMPLANT Right   . COLECTOMY  1990's  . CRYOABLATION N/A 02/24/2014   Procedure: CRYO ABLATION PROSTATE;  Surgeon: Ailene Rud, MD;  Location: [facility] ORS;  Service: Urology;  Laterality: N/A;  . ERCP N/A 01/29/2019   Procedure: ENDOSCOPIC RETROGRADE  CHOLANGIOPANCREATOGRAPHY (ERCP);  Surgeon: Milus Banister, MD;  Location: [facility] ENDOSCOPY;  Service: Endoscopy;  Laterality: N/A;  . HERNIA REPAIR     "abdomen; from colon OR"  . ICD LEAD REMOVAL N/A 09/15/2018   Procedure: ICD LEAD REMOVAL EXTRACTION;  Surgeon: Evans Lance, MD;  Location: [facility] OR;  Service: Cardiovascular;  Laterality: N/A;  DR. BARTLE TO BACK UP  . IMPLANTABLE CARDIOVERTER DEFIBRILLATOR (ICD) GENERATOR CHANGE N/A 01/11/2014   Procedure: ICD GENERATOR CHANGE;  Surgeon: Sanda Klein, MD;  Location: [place] CATH LAB;  Service: Cardiovascular;  Laterality: N/A;  . IR CATHETER TUBE CHANGE  03/01/2019  . IR CATHETER TUBE CHANGE  04/05/2019  . IR CATHETER TUBE CHANGE  07/05/2019  . IR CATHETER TUBE CHANGE  08/02/2019  . IR CATHETER TUBE CHANGE  08/30/2019  . IR CATHETER TUBE CHANGE  09/27/2019  . IR CATHETER TUBE CHANGE  10/25/2019  . IR CATHETER TUBE CHANGE  11/29/2019  . IR EXCHANGE BILIARY DRAIN  12/18/2018  . IR EXCHANGE BILIARY DRAIN  01/05/2019  . IR EXCHANGE BILIARY DRAIN  01/25/2019  . IR EXCHANGE BILIARY DRAIN  01/13/2020  . IR EXCHANGE BILIARY DRAIN  02/28/2020  . IR PERC CHOLECYSTOSTOMY  11/22/2018  . IR RADIOLOGIST EVAL & MGMT  02/26/2019  . IR RADIOLOGIST EVAL & MGMT  03/17/2019  . IR SINUS/FIST TUBE CHK-NON GI  01/28/2019  . LEAD REVISION N/A 01/11/2014   Procedure: LEAD REVISION;  Surgeon: Sanda Klein, MD;  Location: [place] CATH LAB;  Service: Cardiovascular;  Laterality: N/A;  . NM MYOCAR PERF WALL MOTION  01/29/2012   abnormal c/o infarct/scar,no ischemia present  . PROSTATE BIOPSY N/A 11/22/2013   Procedure: PROSTATE BIOPSY AND ULTRASOUND;  Surgeon: Ailene Rud, MD;  Location: [facility];  Service: Urology;  Laterality: N/A;  . REMOVAL OF STONES  01/29/2019   Procedure: REMOVAL OF STONES;  Surgeon: Milus Banister, MD;  Location: [facility] END[facility]COPY;  Service: Endoscopy;;  . RIGHT/LEFT HEART CATH AND CORONARY ANGIOGRAPHY N/A 07/06/2018   Procedure: RIGHT/LEFT HEART CATH AND CORONARY  ANGIOGRAPHY;  Surgeon: Troy Sine, MD;  Location: [place] CV LAB;  Service: Cardiovascular;  Laterality: N/A;  . SPHINCTEROTOMY  01/29/2019   Procedure: SPHINCTEROTOMY;  Surgeon: Milus Banister, MD;  Location: [facility] ENDOSCOPY;  Service: Endoscopy;;  . TEE WITHOUT CARDIOVERSION N/A 08/18/2018   Procedure: TRANSESOPHAGEAL ECHOCARDIOGRAM (TEE);  Surgeon: Burnell Blanks, MD;  Location: [place];  Service: Open Heart Surgery;  Laterality: N/A;  .  TEE WITHOUT CARDIOVERSION N/A 09/08/2018   Procedure: TRANSESOPHAGEAL ECHOCARDIOGRAM (TEE);  Surgeon: Lelon Perla, MD;  Location: [facility] ENDOSCOPY;  Service: Cardiovascular;  Laterality: N/A;  . TEE WITHOUT CARDIOVERSION N/A 09/15/2018   Procedure: TRANSESOPHAGEAL ECHOCARDIOGRAM (TEE);  Surgeon: Evans Lance, MD;  Location: [facility] OR;  Service: Cardiovascular;  Laterality: N/A;  . TEE WITHOUT CARDIOVERSION N/A 11/26/2018   Procedure: TRANSESOPHAGEAL ECHOCARDIOGRAM (TEE);  Surgeon: Jerline Pain, MD;  Location: [facility] ENDOSCOPY;  Service: Cardiovascular;  Laterality: N/A;  . TRANSCATHETER AORTIC VALVE REPLACEMENT, TRANSFEMORAL  08/18/2018  . TRANSCATHETER AORTIC VALVE REPLACEMENT, TRANSFEMORAL N/A 08/18/2018   Procedure: TRANSCATHETER AORTIC VALVE REPLACEMENT, TRANSFEMORAL using an Edwards 68m Aortic Valve;  Surgeon: MBurnell Blanks MD;  [place]  Service: Open Heart Surgery;  Laterality: N/A;    Family History  Problem Relation Age of Onset  . Heart failure Mother        Died of "old age" at 952 . Pneumonia Father       Social History   Socioeconomic History  . Marital status: Married    Spouse name: Not on file  . Number of children: 2  . Years of education: Not on file  . Highest education level: Not on file  Occupational History  . Occupation: Owned a rScientist, physiologicalourHouse"  Tobacco Use  . Smoking status: Never Smoker  . Smokeless tobacco: Never Used  Vaping Use  . Vaping Use: Never used  Substance and Sexual  Activity  . Alcohol use: Not Currently    Alcohol/week: 0.0 standard drinks    Comment: 01/11/2014 "drink 1/2 of a beer twice per month   . Drug use: No  . Sexual activity: Not Currently  Other Topics Concern  . Not on file  Social History Narrative  . Not on file   Social Determinants of Health   Financial Resource Strain:   . Difficulty of Paying Living Expenses:   Food Insecurity:   . Worried About RCharity fundraiserin the Last Year:   . RArboriculturistin the Last Year:   Transportation Needs:   . LFilm/video editor(Medical):   .Marland KitchenLack of Transportation (Non-Medical):   Physical Activity:   . Days of Exercise per Week:   . Minutes of Exercise per Session:   Stress:   . Feeling of Stress :   Social Connections:   . Frequency of Communication with Friends and Family:   . Frequency of Social Gatherings with Friends and Family:   . Attends Religious Services:   . Active Member of Clubs or Organizations:   . Attends CArchivistMeetings:   .Marland KitchenMarital Status:     Allergies  Allergen Reactions  . Crestor [Rosuvastatin] Other (See Comments)    Hurts muscles  . Lipitor [Atorvastatin] Other (See Comments)    Hurts stomach  . Shrimp [Shellfish Allergy] Other (See Comments)    On MAR     Current Outpatient Medications:  .  acetaminophen (TYLENOL) 500 MG tablet, Take 500 mg by mouth every 6 (six) hours as needed (for pain). , Disp: , Rfl:  .  amiodarone (PACERONE) 200 MG tablet, Take 1 tablet (200 mg total) by mouth daily., Disp: 45 tablet, Rfl: 3 .  aspirin EC 81 MG tablet, Take 81 mg by mouth daily., Disp: , Rfl:  .  carvedilol (COREG) 3.125 MG tablet, Take by mouth. Wife give to him when his heart rate is high. prn, Disp: , Rfl:  .  cholecalciferol (VITAMIN D) 1000 units tablet, Take 2,000 Units by mouth at bedtime., Disp: , Rfl:  .  Cranberry-Vit C-Lactobacillus (RA CRANBERRY SUPPLEMENTS PO), Take by mouth., Disp: , Rfl:  .  loratadine (CLARITIN) 10 MG  tablet, Take 10 mg by mouth daily., Disp: , Rfl:  .  polyethylene glycol powder (GLYCOLAX/MIRALAX) 17 GM/SCOOP powder, MIX 17 GRAMS IN 8 OUNCES OF WATER EVERY DAY., Disp: 1020 g, Rfl: 6 .  potassium chloride (KLOR-CON) 10 MEQ tablet, Take 1 tablet (10 mEq total) by mouth daily., Disp: 90 tablet, Rfl: 1 .  ursodiol (ACTIGALL) 300 MG capsule, Take 2 capsules (600 mg total) by mouth 2 (two) times daily., Disp: 360 capsule, Rfl: 3 .  Multiple Vitamins-Minerals (PRESERVISION AREDS 2 PO), Take by mouth. Take one tablet by mouth dailey (Patient not taking: Reported on 04/04/2020), Disp: , Rfl:    Review of Systems  Unable to perform ROS: Dementia           Assessment & Plan:     Observations/Objective:  Mr. Glennon Hamilton appears to be doing relatively stable off antibiotics with proper drainage from his biliary tract.  Did have one febrile episode right before his drain was changed but this resolved with antibiotics and drain exchange  His wife did succumb to COVID-19 infection but I have successfully recovered.  We recommend they get vaccinated for COVID-19  Assessment and Plan:  COVID-19 infection: Guidance is that she and her husband should receive Covid vaccine, in large part due to the fact that mRNA vaccines seem to be more immunogenic than actual natural infection.    He will be difficult for them to arrange for Artha to get a vaccine since he is still housebound.   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  Perhaps he had an episode of this right before his drain was exchanged  He will continue to have drain exchange and we have scheduled a telephone visit visit again in July     .Follow Up Instructions:    I discussed the assessment and treatment plan with the patient. The patient was provided an opportunity to ask questions and  all were answered. The patient agreed with the plan and demonstrated an understanding of the instructions.   The patient was advised to call back or seek an in-person evaluation if the symptoms worsen or if the condition fails to improve as anticipated.  I provided 22 minutes of non-face-to-face time during this encounter.   Alcide Evener, MD

## 2020-04-11 ENCOUNTER — Telehealth: Payer: Self-pay | Admitting: *Deleted

## 2020-04-11 NOTE — Telephone Encounter (Signed)
Per Dr Tommy Medal, referral made to Home Vaccination Program for Covid vaccine. Landis Gandy, RN

## 2020-04-17 ENCOUNTER — Other Ambulatory Visit: Payer: Self-pay | Admitting: Physician Assistant

## 2020-04-17 DIAGNOSIS — R918 Other nonspecific abnormal finding of lung field: Secondary | ICD-10-CM

## 2020-04-17 DIAGNOSIS — Z952 Presence of prosthetic heart valve: Secondary | ICD-10-CM

## 2020-04-24 ENCOUNTER — Ambulatory Visit (HOSPITAL_COMMUNITY)
Admission: RE | Admit: 2020-04-24 | Discharge: 2020-04-24 | Disposition: A | Discharge status: Home / Self Care | Payer: Medicare Other | Source: Ambulatory Visit | Attending: Interventional Radiology | Admitting: Interventional Radiology

## 2020-04-24 ENCOUNTER — Other Ambulatory Visit: Payer: Self-pay

## 2020-04-24 ENCOUNTER — Other Ambulatory Visit (HOSPITAL_COMMUNITY): Payer: Self-pay | Admitting: Interventional Radiology

## 2020-04-24 DIAGNOSIS — K8050 Calculus of bile duct without cholangitis or cholecystitis without obstruction: Secondary | ICD-10-CM

## 2020-04-24 DIAGNOSIS — Z4803 Encounter for change or removal of drains: Secondary | ICD-10-CM | POA: Diagnosis present

## 2020-04-24 DIAGNOSIS — Z4659 Encounter for fitting and adjustment of other gastrointestinal appliance and device: Secondary | ICD-10-CM | POA: Diagnosis not present

## 2020-04-24 DIAGNOSIS — K8044 Calculus of bile duct with chronic cholecystitis without obstruction: Secondary | ICD-10-CM | POA: Diagnosis not present

## 2020-04-24 HISTORY — PX: IR EXCHANGE BILIARY DRAIN: IMG6046

## 2020-04-24 MED ORDER — IOHEXOL 300 MG/ML  SOLN
12.0000 mL | Freq: Once | INTRAMUSCULAR | Status: AC | PRN
  Administered 2020-04-24: 12 mL

## 2020-04-24 MED ORDER — LIDOCAINE HCL 1 % IJ SOLN
INTRAMUSCULAR | Status: AC
  Administered 2020-04-24: 5 mL
  Filled 2020-04-24: qty 20

## 2020-05-08 ENCOUNTER — Other Ambulatory Visit (HOSPITAL_COMMUNITY): Payer: Self-pay | Admitting: Interventional Radiology

## 2020-05-08 MED FILL — NORMAL SALINE FLUSH SYRINGE: 0.9 | 22 days supply | Qty: 900 | Fill #0

## 2020-05-11 ENCOUNTER — Telehealth: Payer: Self-pay | Admitting: Cardiovascular Disease

## 2020-05-11 NOTE — Telephone Encounter (Signed)
LVM for patient to return call to get follow up scheduled with [facility] from recall list

## 2020-06-09 DIAGNOSIS — Z23 Encounter for immunization: Secondary | ICD-10-CM | POA: Diagnosis not present

## 2020-06-24 ENCOUNTER — Telehealth: Payer: Self-pay | Admitting: Physician Assistant

## 2020-06-24 NOTE — Telephone Encounter (Signed)
Called to discuss the homebound Covid-19 vaccination initiative with the patient and/or caregiver.   Message left to call back.  Woodard Perrell PA-C  [facility]     

## 2020-06-26 NOTE — Telephone Encounter (Signed)
Just updating you that the Home bound covid vaccine group left message for patient to schedule immunization.

## 2020-06-26 NOTE — Telephone Encounter (Signed)
Cant see your text Sharyn Lull

## 2020-07-03 ENCOUNTER — Telehealth: Payer: Self-pay | Admitting: Cardiovascular Disease

## 2020-07-03 ENCOUNTER — Ambulatory Visit (HOSPITAL_COMMUNITY)
Admission: RE | Admit: 2020-07-03 | Discharge: 2020-07-03 | Disposition: A | Discharge status: Home / Self Care | Payer: Medicare Other | Source: Ambulatory Visit | Attending: Interventional Radiology | Admitting: Interventional Radiology

## 2020-07-03 ENCOUNTER — Other Ambulatory Visit: Payer: Self-pay

## 2020-07-03 ENCOUNTER — Other Ambulatory Visit (HOSPITAL_COMMUNITY): Payer: Self-pay | Admitting: Interventional Radiology

## 2020-07-03 DIAGNOSIS — K8050 Calculus of bile duct without cholangitis or cholecystitis without obstruction: Secondary | ICD-10-CM | POA: Insufficient documentation

## 2020-07-03 DIAGNOSIS — Z4803 Encounter for change or removal of drains: Secondary | ICD-10-CM | POA: Diagnosis not present

## 2020-07-03 DIAGNOSIS — I5022 Chronic systolic (congestive) heart failure: Secondary | ICD-10-CM

## 2020-07-03 DIAGNOSIS — K831 Obstruction of bile duct: Secondary | ICD-10-CM | POA: Diagnosis not present

## 2020-07-03 HISTORY — PX: IR EXCHANGE BILIARY DRAIN: IMG6046

## 2020-07-03 MED ORDER — IOHEXOL 300 MG/ML  SOLN
50.0000 mL | Freq: Once | INTRAMUSCULAR | Status: AC | PRN
  Administered 2020-07-03: 8 mL

## 2020-07-03 MED ORDER — LIDOCAINE HCL 1 % IJ SOLN
INTRAMUSCULAR | Status: AC
  Filled 2020-07-03: qty 20

## 2020-07-03 NOTE — Telephone Encounter (Signed)
I attempted to contact patient to notify of the lab work- they may still be over at the hospital, I did place the orders- hoping someone would see them. I left a voicemail, the appointment for the hospital was at 2:15 today.   I did apologize for the delay, notified that Dr.C was in clinic this morning, and just was able to check his message.   I did advise her to call back- left call back number.

## 2020-07-03 NOTE — Telephone Encounter (Signed)
Called, spoke to wife- she states patient is suppose to go to [facility] today to have a radiology appointment and wanted to know if Dr.C would want blood work today while he is out- and if so which orders.   Thank you!  I advised with her I would send a message, wife verbalized understanding.

## 2020-07-03 NOTE — Telephone Encounter (Signed)
° ° ° °  Pt's wife called, she said pt been bed bound and it's been a long time since his last lab work. She said today pt have an appt in [facility] at 2:15 and would like to ask Dr. Loletha Grayer if he can arrange pt to get blood work done today at [facility]

## 2020-07-03 NOTE — Telephone Encounter (Signed)
CBC and CMET would be nice

## 2020-07-06 ENCOUNTER — Other Ambulatory Visit: Payer: Self-pay

## 2020-07-06 ENCOUNTER — Telehealth (INDEPENDENT_AMBULATORY_CARE_PROVIDER_SITE_OTHER): Payer: Medicare Other | Admitting: Infectious Disease

## 2020-07-06 DIAGNOSIS — F0390 Unspecified dementia without behavioral disturbance: Secondary | ICD-10-CM | POA: Diagnosis not present

## 2020-07-06 DIAGNOSIS — T827XXD Infection and inflammatory reaction due to other cardiac and vascular devices, implants and grafts, subsequent encounter: Secondary | ICD-10-CM | POA: Diagnosis not present

## 2020-07-06 DIAGNOSIS — Z7185 Encounter for immunization safety counseling: Secondary | ICD-10-CM

## 2020-07-06 DIAGNOSIS — B952 Enterococcus as the cause of diseases classified elsewhere: Secondary | ICD-10-CM

## 2020-07-06 DIAGNOSIS — U071 COVID-19: Secondary | ICD-10-CM | POA: Diagnosis not present

## 2020-07-06 DIAGNOSIS — I428 Other cardiomyopathies: Secondary | ICD-10-CM | POA: Diagnosis not present

## 2020-07-06 DIAGNOSIS — I2510 Atherosclerotic heart disease of native coronary artery without angina pectoris: Secondary | ICD-10-CM | POA: Diagnosis not present

## 2020-07-06 DIAGNOSIS — K8309 Other cholangitis: Secondary | ICD-10-CM | POA: Diagnosis not present

## 2020-07-06 DIAGNOSIS — Z7189 Other specified counseling: Secondary | ICD-10-CM

## 2020-07-06 DIAGNOSIS — Z95810 Presence of automatic (implantable) cardiac defibrillator: Secondary | ICD-10-CM | POA: Diagnosis not present

## 2020-07-06 DIAGNOSIS — I330 Acute and subacute infective endocarditis: Secondary | ICD-10-CM

## 2020-07-06 DIAGNOSIS — J1282 Pneumonia due to coronavirus disease 2019: Secondary | ICD-10-CM | POA: Diagnosis not present

## 2020-07-06 NOTE — Progress Notes (Signed)
Virtual Visit via Telephone Note  I connected with William [name] and his wife on 07/06/20 at 10:45 AM EDT  HCPO, his wife [name], retail by telephone and verified that I am speaking with the correct person using two identifiers.  Location: Patient: Home Provider: [facility]   I discussed the limitations, risks, security and privacy concerns of performing an evaluation and management service by telephone and the availability of in person appointments. I also discussed with the patient's surrogate (his wife)  that there may be a patient responsible charge related to this service. The patients' surrogate expressed understanding and agreed to proceed.   History of Present Illness:  86 y.o.malewithrecurrent episodes of cholangitis and sepsis at times with bacteremias including a time when he had to have his ICD removed, again remitted admitted with cholangitis and sepsis in the context of an obstructed cholecystostomy tube, and concern for potential obstruction of his biliary tree due to stones.  He was found not to be a surgical candidate which seems quite reasonable to me given his multiple comorbidities including his dementia.  GI were able to remove multiple stones during his admission and cultures from the biliary tree yielded ampicillin sensitive enterococcal species and an Enterobacter species.  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  Patient was met with with palliative care on multiple occasions but his family continued to want "aggressive care".  They have been seeming to want to have a second opinion from a surgeon which seems not very realistic especially in the current environment.  Ultimately arrangements were made for him to continue on the meropenem and later changed to  Augmentin and Bactrim and has done  well.   He has been seen by interventional radiology which have changed his drain.  They cultured his bile  which had multiple calculi in it but it did not appear purulent or not documented as such. Culture was taken which I woud NOT have preferred UNLESS it was purulent. 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  He was seen at [facility] and underwent 1. Percutaneous gallstone extraction.  2. Cystic duct recanalization and balloon dilation  3. Cholecystostomy tube exchange. on August the 4th, 2020  They found   1. Minimal residual cholelithiasis with successful clearance of the  remaining gallstones.  2. Focal stenosis and occlusion of the cystic duct with successful  recanalization and balloon dilation to 77m. The cystic duct is now widely  patent.  3. Choledocholithiasis without evidence of CBD obstruction.  4 Successful exchange of a new 16 french cholecystostomy tube.   Given the large amount of gallstones found in GB decision made to leave the cholecystostomy in place and have it exchanged every 3 months  I did  think we can trial him off of antibiotics this visit  The family wanted and have stopped the ACTIGALl though I would recommend continuing it. They are to talk with GI re this.  JDylenhad in the interim developed swelling of what sounds like his parotid gland that is been present for roughly 10 days.  IIt did  not improved he does not have fevers chills confusion or other systemic  symptoms.  I was concerned however he could have a MRSA infection in this area that might be resistant to the Bactrim that he is been taking chronically. I placed him on doxycycline and this resolved.   Since then he developed some sleepiness along with a low-grade fever.  His urine has become darker and they had  send it for analysis and culture with his primary care physician.  The organism isolated was fairly unusual 1 and was resistant to most antimicrobials that were orally available other than Macrobid.  He was initially prescribed Bactrim but then later Macrobid and apparently the urine "cleared up.  However his sleepiness has persisted may have even worsened in the last week.  He is also developed a pressure ulcer on his sacral area that was evaluated by wound care   He was seen by interventional radiology and who changed his drain and noted that he had now obstruction of his cystic duct by a stone.  I was apprehensive that he may be harboringInfection now at this obstructed site that could put him at risk for recurrent cholangitis and worse case scenario recurrent bacteremia.  I arrangde for him to be seen in clinic in late October 2020.  On exam in clinic he is fairly somnolent wearing a mask could  barely answer any questions and is wearing mittens to restrain him from pulling out his drain.   In December he began having fevers that turned out that this was the beginning of COVID-19 infection.  Both he and his wife tested positive.  I was able to help his wife get in contact with the monoclonal antibody infusion clinic and she received a dose of monoclonal antibodies.  They were not able to get Ervin out of the house to get him infused with antibodies.  He also was evaluated in the ER but felt stable for discharge back to home and he and his wife both recovered from this infection at home.  He  had no further evidence of recurrent cholangitis.  His drain is again been change in the frequency of pain exchanges been changed every 5 weeks.  He has been off antibiotics since before my last visit with hin.  Drain exchange changed to every 10 weeks   He has only been able to sit in a chair since he had COVID and has to be lifted.     Past Medical History:  Diagnosis Date  . AAA (abdominal  aortic aneurysm) ([place])    7/13 3.8cm  . Arthritis    "joints" (01/11/2014)  . Asthma    "seasonal; some foods"   . Chronic systolic CHF (congestive heart failure) ([place])   . COVID-19 12/15/2019  . Dementia ([place])   . HLD (hyperlipidemia)   . HOH (hard of hearing)   . Lumbar vertebral fracture ([facility])    L1- 04/11/2014   . Parotitis 06/10/2019  . Prostate cancer ([place])   . S/P ICD (internal cardiac defibrillator) procedure, 01/11/14 removal of ERI gen and placement of Medtronic Evera XT VR & NEW Right ventricular lead Medtronic 01/12/2014  . S/P TAVR (transcatheter aortic valve replacement)    Edwards Sapien 3 THV (size 29 mm, model # B6411258, serial # [id number])  . Severe aortic stenosis   . Sleep apnea    "lost 60# & don't have it anymore" (01/11/2014)  . Somnolence 08/04/2019  . UTI (urinary tract infection) 08/04/2019  . Ventricular tachycardia [facility])     Past Surgical History:  Procedure Laterality Date  .  CARDIAC CATHETERIZATION  08/20/2004   noncritical CAD,mild global hypokinesis, EF 50%  . CARDIAC DEFIBRILLATOR PLACEMENT  08/23/2004   Medtronic  . CATARACT EXTRACTION W/ INTRAOCULAR LENS IMPLANT Right   . COLECTOMY  1990's  . CRYOABLATION N/A 02/24/2014   Procedure: CRYO ABLATION PROSTATE;  Surgeon: Ailene Rud, MD;  Location: [facility] ORS;  Service: Urology;  Laterality: N/A;  . ERCP N/A 01/29/2019   Procedure: ENDOSCOPIC RETROGRADE CHOLANGIOPANCREATOGRAPHY (ERCP);  Surgeon: Milus Banister, MD;  Location: [facility] ENDOSCOPY;  Service: Endoscopy;  Laterality: N/A;  . HERNIA REPAIR     "abdomen; from colon OR"  . ICD LEAD REMOVAL N/A 09/15/2018   Procedure: ICD LEAD REMOVAL EXTRACTION;  Surgeon: Evans Lance, MD;  Location: [facility] OR;  Service: Cardiovascular;  Laterality: N/A;  DR. BARTLE TO BACK UP  . IMPLANTABLE CARDIOVERTER DEFIBRILLATOR (ICD) GENERATOR CHANGE N/A 01/11/2014   Procedure: ICD GENERATOR CHANGE;  Surgeon: Sanda Klein, MD;  Location: [place] CATH LAB;  Service:  Cardiovascular;  Laterality: N/A;  . IR CATHETER TUBE CHANGE  03/01/2019  . IR CATHETER TUBE CHANGE  04/05/2019  . IR CATHETER TUBE CHANGE  07/05/2019  . IR CATHETER TUBE CHANGE  08/02/2019  . IR CATHETER TUBE CHANGE  08/30/2019  . IR CATHETER TUBE CHANGE  09/27/2019  . IR CATHETER TUBE CHANGE  10/25/2019  . IR CATHETER TUBE CHANGE  11/29/2019  . IR EXCHANGE BILIARY DRAIN  12/18/2018  . IR EXCHANGE BILIARY DRAIN  01/05/2019  . IR EXCHANGE BILIARY DRAIN  01/25/2019  . IR EXCHANGE BILIARY DRAIN  01/13/2020  . IR EXCHANGE BILIARY DRAIN  02/28/2020  . IR EXCHANGE BILIARY DRAIN  04/24/2020  . IR EXCHANGE BILIARY DRAIN  07/03/2020  . IR PERC CHOLECYSTOSTOMY  11/22/2018  . IR RADIOLOGIST EVAL & MGMT  02/26/2019  . IR RADIOLOGIST EVAL & MGMT  03/17/2019  . IR SINUS/FIST TUBE CHK-NON GI  01/28/2019  . LEAD REVISION N/A 01/11/2014   Procedure: LEAD REVISION;  Surgeon: Sanda Klein, MD;  Location: [place] CATH LAB;  Service: Cardiovascular;  Laterality: N/A;  . NM MYOCAR PERF WALL MOTION  01/29/2012   abnormal c/o infarct/scar,no ischemia present  . PROSTATE BIOPSY N/A 11/22/2013   Procedure: PROSTATE BIOPSY AND ULTRASOUND;  Surgeon: Ailene Rud, MD;  Location: [facility];  Service: Urology;  Laterality: N/A;  . REMOVAL OF STONES  01/29/2019   Procedure: REMOVAL OF STONES;  Surgeon: Milus Banister, MD;  Location: [facility] ENDOSCOPY;  Service: Endoscopy;;  . RIGHT/LEFT HEART CATH AND CORONARY ANGIOGRAPHY N/A 07/06/2018   Procedure: RIGHT/LEFT HEART CATH AND CORONARY ANGIOGRAPHY;  Surgeon: Troy Sine, MD;  Location: [place] CV LAB;  Service: Cardiovascular;  Laterality: N/A;  . SPHINCTEROTOMY  01/29/2019   Procedure: SPHINCTEROTOMY;  Surgeon: Milus Banister, MD;  Location: [facility] ENDOSCOPY;  Service: Endoscopy;;  . TEE WITHOUT CARDIOVERSION N/A 08/18/2018   Procedure: TRANSESOPHAGEAL ECHOCARDIOGRAM (TEE);  Surgeon: Burnell Blanks, MD;  Location: [place];  Service: Open Heart Surgery;  Laterality: N/A;  . TEE  WITHOUT CARDIOVERSION N/A 09/08/2018   Procedure: TRANSESOPHAGEAL ECHOCARDIOGRAM (TEE);  Surgeon: Lelon Perla, MD;  Location: [facility] ENDOSCOPY;  Service: Cardiovascular;  Laterality: N/A;  . TEE WITHOUT CARDIOVERSION N/A 09/15/2018   Procedure: TRANSESOPHAGEAL ECHOCARDIOGRAM (TEE);  Surgeon: Evans Lance, MD;  Location: [facility] OR;  Service: Cardiovascular;  Laterality: N/A;  . TEE WITHOUT CARDIOVERSION N/A 11/26/2018   Procedure: TRANSESOPHAGEAL ECHOCARDIOGRAM (TEE);  Surgeon: Jerline Pain, MD;  Location: [facility] ENDOSCOPY;  Service: Cardiovascular;  Laterality: N/A;  . TRANSCATHETER AORTIC VALVE REPLACEMENT, TRANSFEMORAL  08/18/2018  . TRANSCATHETER AORTIC VALVE REPLACEMENT, TRANSFEMORAL N/A 08/18/2018   Procedure: TRANSCATHETER AORTIC VALVE REPLACEMENT, TRANSFEMORAL using an Edwards 55m Aortic Valve;  Surgeon: MBurnell Blanks MD;  Location[place]  Service: Open Heart Surgery;  Laterality: N/A;    Family History  Problem Relation Age of Onset  . Heart failure Mother        Died of "old age" at 966 . Pneumonia Father       Social History   Socioeconomic History  . Marital status: Married    Spouse name: Not on file  . Number of children: 2  . Years of education: Not on file  . Highest education level: Not on file  Occupational History  . Occupation: Owned a rScientist, [employer]"  Tobacco Use  . Smoking status: Never Smoker  . Smokeless tobacco: Never Used  Vaping Use  . Vaping Use: Never used  Substance and Sexual Activity  . Alcohol use: Not Currently    Alcohol/week: 0.0 standard drinks    Comment: 01/11/2014 "drink 1/2 of a beer twice per month   . Drug use: No  . Sexual activity: Not Currently  Other Topics Concern  . Not on file  Social History Narrative  . Not on file   Social Determinants of Health   Financial Resource Strain:   . Difficulty of Paying Living Expenses: Not on file  Food Insecurity:   . Worried About RCharity fundraiserin the Last Year:  Not on file  . Ran Out of Food in the Last Year: Not on file  Transportation Needs:   . Lack of Transportation (Medical): Not on file  . Lack of Transportation (Non-Medical): Not on file  Physical Activity:   . Days of Exercise per Week: Not on file  . Minutes of Exercise per Session: Not on file  Stress:   . Feeling of Stress : Not on file  Social Connections:   . Frequency of Communication with Friends and Family: Not on file  . Frequency of Social Gatherings with Friends and Family: Not on file  . Attends Religious Services: Not on file  . Active Member of Clubs or Organizations: Not on file  . Attends CArchivistMeetings: Not on file  . Marital Status: Not on file    Allergies  Allergen Reactions  . Crestor [Rosuvastatin] Other (See Comments)    Hurts muscles  . Lipitor [Atorvastatin] Other (See Comments)    Hurts stomach  . Shrimp [Shellfish Allergy] Other (See Comments)    On MAR     Current Outpatient Medications:  .  acetaminophen (TYLENOL) 500 MG tablet, Take 500 mg by mouth every 6 (six) hours as needed (for pain). , Disp: , Rfl:  .  amiodarone (PACERONE) 200 MG tablet, Take 1 tablet (200 mg total) by mouth daily., Disp: 45 tablet, Rfl: 3 .  aspirin EC 81 MG tablet, Take 81 mg by mouth daily., Disp: , Rfl:  .  carvedilol (COREG) 3.125 MG tablet, Take by mouth. Wife give to him when his heart rate is high. prn, Disp: , Rfl:  .  cholecalciferol (VITAMIN D) 1000 units tablet, Take 2,000 Units by mouth at bedtime., Disp: , Rfl:  .  Cranberry-Vit C-Lactobacillus (RA CRANBERRY SUPPLEMENTS PO), Take by mouth., Disp: , Rfl:  .  loratadine (CLARITIN) 10 MG tablet, Take 10 mg by mouth daily., Disp: , Rfl:  .  polyethylene glycol  powder (GLYCOLAX/MIRALAX) 17 GM/SCOOP powder, MIX 17 GRAMS IN 8 OUNCES OF WATER EVERY DAY., Disp: 1020 g, Rfl: 6 .  potassium chloride (KLOR-CON) 10 MEQ tablet, Take 1 tablet (10 mEq total) by mouth daily., Disp: 90 tablet, Rfl: 1 .  ursodiol  (ACTIGALL) 300 MG capsule, Take 2 capsules (600 mg total) by mouth 2 (two) times daily., Disp: 360 capsule, Rfl: 3 .  Multiple Vitamins-Minerals (PRESERVISION AREDS 2 PO), Take by mouth. Take one tablet by mouth dailey (Patient not taking: Reported on 04/04/2020), Disp: , Rfl:    Review of Systems  Unable to perform ROS: Dementia           Assessment & Plan:     Observations/Objective:  Mr. Glennon Hamilton appears to be doing relatively stable off antibiotics with proper drainage from his biliary tract.     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    He will continue to have drain exchange and we have scheduled a telephone visit visit again in 3 months  COVID prevention; he was able to get J&J vaccine x 1     .Follow Up Instructions:    I discussed the assessment and treatment plan with the patient. The patient was provided an opportunity to ask questions and all were answered. The patient agreed with the plan and demonstrated an understanding of the instructions.   The patient was advised to call back or seek an in-person evaluation if the symptoms worsen or if the condition fails to improve as anticipated.  I provided 21 minutes of non-face-to-face time during this encounter.   Alcide Evener, MD

## 2020-07-07 ENCOUNTER — Telehealth: Payer: Self-pay | Admitting: Cardiovascular Disease

## 2020-07-07 NOTE — Telephone Encounter (Signed)
Patient's wife states the patient will be going to his PCP [facility] on 07/24/2020. She would like to know if his lab orders can be sent to them so he can have his blood drawn then. She would also like to know if his appointment with Dr. Sallyanne Kuster should be pushed back to after the results are received. The office fax: [phone]

## 2020-07-07 NOTE — Telephone Encounter (Signed)
CBC & BMET orders in Epic  Routed to primary nurse

## 2020-07-11 ENCOUNTER — Telehealth: Payer: Medicare Other | Admitting: Infectious Disease

## 2020-07-11 ENCOUNTER — Encounter: Payer: Self-pay | Admitting: Cardiovascular Disease

## 2020-07-11 ENCOUNTER — Telehealth (INDEPENDENT_AMBULATORY_CARE_PROVIDER_SITE_OTHER): Payer: Medicare Other | Admitting: Cardiovascular Disease

## 2020-07-11 VITALS — BP 116/80 | HR 80 | Temp 97.2°F

## 2020-07-11 DIAGNOSIS — I428 Other cardiomyopathies: Secondary | ICD-10-CM

## 2020-07-11 DIAGNOSIS — I472 Ventricular tachycardia, unspecified: Secondary | ICD-10-CM

## 2020-07-11 DIAGNOSIS — Z952 Presence of prosthetic heart valve: Secondary | ICD-10-CM | POA: Diagnosis not present

## 2020-07-11 DIAGNOSIS — Z79899 Other long term (current) drug therapy: Secondary | ICD-10-CM | POA: Diagnosis not present

## 2020-07-11 DIAGNOSIS — I714 Abdominal aortic aneurysm, without rupture, unspecified: Secondary | ICD-10-CM

## 2020-07-11 DIAGNOSIS — K8050 Calculus of bile duct without cholangitis or cholecystitis without obstruction: Secondary | ICD-10-CM | POA: Diagnosis not present

## 2020-07-11 DIAGNOSIS — Z5181 Encounter for therapeutic drug level monitoring: Secondary | ICD-10-CM

## 2020-07-11 DIAGNOSIS — I2510 Atherosclerotic heart disease of native coronary artery without angina pectoris: Secondary | ICD-10-CM | POA: Diagnosis not present

## 2020-07-11 NOTE — Telephone Encounter (Signed)
Spoke to the patient's wife at the virtual appointment. Labs will be checked at the PCP appointment.

## 2020-07-11 NOTE — Patient Instructions (Signed)
Medication Instructions:  No changes *If you need a refill on your cardiac medications before your next appointment, please call your pharmacy*   Lab Work: When you get your lab work collected at your PCP, make sure to include a CMET and TSH If you have labs (blood work) drawn today and your tests are completely normal, you will receive your results only by:  [place] (if you have MyChart) OR  A paper copy in the mail If you have any lab test that is abnormal or we need to change your treatment, we will call you to review the results.   Testing/Procedures: None   Follow-Up: At [facility], you and your health needs are our priority.  As part of our continuing mission to provide you with exceptional heart care, we have created designated Provider Care Teams.  These Care Teams include your primary Cardiologist (physician) and Advanced Practice Providers (APPs -  Physician Assistants and Nurse Practitioners) who all work together to provide you with the care you need, when you need it.  We recommend signing up for the patient portal called "MyChart".  Sign up information is provided on this After Visit Summary.  MyChart is used to connect with patients for Virtual Visits (Telemedicine).  Patients are able to view lab/test results, encounter notes, upcoming appointments, etc.  Non-urgent messages can be sent to your provider as well.   To learn more about what you can do with MyChart, go to [email or web address].    Your next appointment:   12 month(s)  The format for your next appointment:   Virtual Visit   Provider:   Sanda Klein, MD   Other Instructions None

## 2020-07-11 NOTE — Progress Notes (Signed)
Virtual Visit via Video Note   This visit type was conducted due to national recommendations for restrictions regarding the COVID-19 Pandemic (e.g. social distancing) in an effort to limit this patient's exposure and mitigate transmission in our community.  Due to his co-morbid illnesses, this patient is at least at moderate risk for complications without adequate follow up.  This format is felt to be most appropriate for this patient at this time.  All issues noted in this document were discussed and addressed.  A limited physical exam was performed with this format.  Please refer to the patient's chart for his consent to telehealth for [facility].   Date:  07/11/2020   ID:  William [name], DOB [id number], MRN [id number]  Patient Location: Home Provider Location: Office/Clinic  PCP:  Gayland Curry, DO  Cardiologist:  Sanda Klein, MD  Electrophysiologist:  None   Evaluation Performed:  Follow-Up Visit  Chief Complaint:  CHF, VT  History of Present Illness:    William [name] is a 86 y.o. 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  He has had a remarkably quiet year from a cardiovascular point of view without any issues related to heart failure or arrhythmia. Last Christmas both [name] and his wife and had COVID-19 infection. They had received the vaccines and only had relatively "light colds", a little bit of transient fever. They actually got the infection from their physical therapist. Following the reduced ability to mobilize him during the acute illness, [name] has never again been able to stand up without support or walk. He does spend 2 or 3 hours sitting  up in a chair every day, but has to be transferred with a lift. He is talking less and less. He still interacts with facial expressions. He has daily visiting nurse and physical therapist.  Over the last many months his heart rate has consistently been around 80 bpm without the previous wide variations (probably due to resolution of atrial fibrillation). He has been on amiodarone for a year now. He has not had any bradycardia. He has not experienced syncope. He has not had problems with lower extremity edema. He has not had any recent fever or chills. He has not required hospitalization in a year. His biliary drain was changed out last week and he has another stone in the bile duct. He is taking ursodiol.  He is scheduled for labs with Dr. Osborne Casco on October 11. We will make sure the TSH is included.    Past Medical History:  Diagnosis Date   AAA (abdominal aortic aneurysm) ([place])    7/13 3.8cm   Arthritis    "joints" (01/11/2014)   Asthma    "seasonal; some foods"    Chronic systolic CHF (congestive heart failure) ([place])    COVID-19 12/15/2019   Dementia ([place])    HLD (hyperlipidemia)    HOH (hard of hearing)    Lumbar vertebral fracture ([place])    L1- 04/11/2014    Parotitis 06/10/2019   Prostate cancer ([place])    S/P ICD (internal cardiac defibrillator) procedure, 01/11/14 removal of ERI gen and placement of Medtronic Evera XT VR & NEW Right ventricular lead Medtronic 01/12/2014   S/P TAVR (transcatheter aortic valve replacement)    Edwards Sapien 3  THV (size 29 mm, model # [id number], serial # [id number])   Severe aortic stenosis    Sleep apnea    "lost 60# & don't have it anymore" (01/11/2014)   Somnolence 08/04/2019   UTI (urinary tract infection) 08/04/2019   Ventricular tachycardia ([place])    Past Surgical History:  Procedure Laterality Date   CARDIAC CATHETERIZATION  08/20/2004   noncritical CAD,mild global hypokinesis, EF 50%   CARDIAC DEFIBRILLATOR PLACEMENT  08/23/2004    Medtronic   CATARACT EXTRACTION W/ INTRAOCULAR LENS IMPLANT Right    COLECTOMY  1990's   CRYOABLATION N/A 02/24/2014   Procedure: CRYO ABLATION PROSTATE;  Surgeon: Ailene Rud, MD;  Location: [facility]S;  Service: Urology;  Laterality: N/A;   ERCP N/A 01/29/2019   Procedure: ENDOSCOPIC RETROGRADE CHOLANGIOPANCREATOGRAPHY (ERCP);  Surgeon: Milus Banister, MD;  Location[facility] ENDOSCOPY;  Service: Endoscopy;  Laterality: N/A;   HERNIA REPAIR     "abdomen; from colon OR"   ICD LEAD REMOVAL N/A 09/15/2018   Procedure: ICD LEAD REMOVAL EXTRACTION;  Surgeon: Evans Lance, MD;  Location: [facility] OR;  Service: Cardiovascular;  Laterality: N/A;  DR. BARTLE TO BACK UP   IMPLANTABLE CARDIOVERTER DEFIBRILLATOR (ICD) GENERATOR CHANGE N/A 01/11/2014   Procedure: ICD GENERATOR CHANGE;  Surgeon: Sanda Klein, MD;  Location: [facility] CATH LAB;  Service: Cardiovascular;  Laterality: N/A;   IR CATHETER TUBE CHANGE  03/01/2019   IR CATHETER TUBE CHANGE  04/05/2019   IR CATHETER TUBE CHANGE  07/05/2019   IR CATHETER TUBE CHANGE  08/02/2019   IR CATHETER TUBE CHANGE  08/30/2019   IR CATHETER TUBE CHANGE  09/27/2019   IR CATHETER TUBE CHANGE  10/25/2019   IR CATHETER TUBE CHANGE  11/29/2019   IR EXCHANGE BILIARY DRAIN  12/18/2018   IR EXCHANGE BILIARY DRAIN  01/05/2019   IR EXCHANGE BILIARY DRAIN  01/25/2019   IR EXCHANGE BILIARY DRAIN  01/13/2020   IR EXCHANGE BILIARY DRAIN  02/28/2020   IR EXCHANGE BILIARY DRAIN  04/24/2020   IR EXCHANGE BILIARY DRAIN  07/03/2020   IR PERC CHOLECYSTOSTOMY  11/22/2018   IR RADIOLOGIST EVAL & MGMT  02/26/2019   IR RADIOLOGIST EVAL & MGMT  03/17/2019   IR SINUS/FIST TUBE CHK-NON GI  01/28/2019   LEAD REVISION N/A 01/11/2014   Procedure: LEAD REVISION;  Surgeon: Sanda Klein, MD;  Location: [facility] CATH LAB;  Service: Cardiovascular;  Laterality: N/A;   NM MYOCAR PERF WALL MOTION  01/29/2012   abnormal c/o infarct/scar,no ischemia present   PROSTATE BIOPSY N/A 11/22/2013     Procedure: PROSTATE BIOPSY AND ULTRASOUND;  Surgeon: Ailene Rud, MD;  Location: [facility]S;  Service: Urology;  Laterality: N/A;   REMOVAL OF STONES  01/29/2019   Procedure: REMOVAL OF STONES;  Surgeon: Milus Banister, MD;  Location[facility] ENDOSCOPY;  Service: Endoscopy;;   RIGHT/LEFT HEART CATH AND CORONARY ANGIOGRAPHY N/A 07/06/2018   Procedure: RIGHT/LEFT HEART CATH AND CORONARY ANGIOGRAPHY;  Surgeon: Troy Sine, MD;  Location: [facility] CV LAB;  Service: Cardiovascular;  Laterality: N/A;   SPHINCTEROTOMY  01/29/2019   Procedure: SPHINCTEROTOMY;  Surgeon: Milus Banister, MD;  Location: [facility] ENDOSCOPY;  Service: Endoscopy;;   TEE WITHOUT CARDIOVERSION N/A 08/18/2018   Procedure: TRANSESOPHAGEAL ECHOCARDIOGRAM (TEE);  Surgeon: Burnell Blanks, MD;  Location: [facility];  Service: Open Heart Surgery;  Laterality: N/A;   TEE WITHOUT CARDIOVERSION N/A 09/08/2018   Procedure: TRANSESOPHAGEAL ECHOCARDIOGRAM (TEE);  Surgeon: Lelon Perla, MD;  Location: [facility] ENDOSCOPY;  Service: Cardiovascular;  Laterality: N/A;   TEE WITHOUT CARDIOVERSION N/A 09/15/2018   Procedure: TRANSESOPHAGEAL ECHOCARDIOGRAM (TEE);  Surgeon: Evans Lance, MD;  Location: [facility] OR;  Service: Cardiovascular;  Laterality: N/A;   TEE WITHOUT CARDIOVERSION N/A 11/26/2018   Procedure: TRANSESOPHAGEAL ECHOCARDIOGRAM (TEE);  Surgeon: Jerline Pain, MD;  Location: [facility] ENDOSCOPY;  Service: Cardiovascular;  Laterality: N/A;   TRANSCATHETER AORTIC VALVE REPLACEMENT, TRANSFEMORAL  08/18/2018   TRANSCATHETER AORTIC VALVE REPLACEMENT, TRANSFEMORAL N/A 08/18/2018   Procedure: TRANSCATHETER AORTIC VALVE REPLACEMENT, TRANSFEMORAL using an Edwards 50mm Aortic Valve;  Surgeon: Burnell Blanks, MD;  Location: [place];  Service: Open Heart Surgery;  Laterality: N/A;     Current Meds  Medication Sig   acetaminophen (TYLENOL) 500 MG tablet Take 500 mg by mouth every 6 (six) hours as needed (for pain).    amiodarone  (PACERONE) 200 MG tablet Take 1 tablet (200 mg total) by mouth daily.   aspirin EC 81 MG tablet Take 81 mg by mouth 3 (three) times a week.    cholecalciferol (VITAMIN D) 1000 units tablet Take 2,000 Units by mouth at bedtime.   Cranberry-Vit C-Lactobacillus (RA CRANBERRY SUPPLEMENTS PO) Take by mouth in the morning and at bedtime.    loratadine (CLARITIN) 10 MG tablet Take 10 mg by mouth daily.   Multiple Vitamins-Minerals (PRESERVISION AREDS 2 PO) Take by mouth. Take one tablet by mouth dailey    polyethylene glycol powder (GLYCOLAX/MIRALAX) 17 GM/SCOOP powder MIX 17 GRAMS IN 8 OUNCES OF WATER EVERY DAY.   potassium chloride (KLOR-CON) 10 MEQ tablet Take 1 tablet (10 mEq total) by mouth daily. (Patient taking differently: Take 10 mEq by mouth 3 (three) times a week. )   ursodiol (ACTIGALL) 300 MG capsule Take 2 capsules (600 mg total) by mouth 2 (two) times daily.   zinc gluconate 50 MG tablet Take 50 mg by mouth daily.     Allergies:   Crestor [rosuvastatin], Lipitor [atorvastatin], and Shrimp [shellfish allergy]   Social History   Tobacco Use   Smoking status: Never Smoker   Smokeless tobacco: Never Used  Vaping Use   Vaping Use: Never used  Substance Use Topics   Alcohol use: Not Currently    Alcohol/week: 0.0 standard drinks    Comment: 01/11/2014 "drink 1/2 of a beer twice per month    Drug use: No     Family Hx: The patient's family history includes Heart failure in his mother; Pneumonia in his father.  ROS:   Please see the history of present illness.    All other systems are reviewed and are negative.   Prior CV studies:   The following studies were reviewed today:  Labs/Other Tests and Data Reviewed:    EKG:  No ECG reviewed.  Recent Labs: 09/16/2019: TSH 4.92 10/13/2019: ALT 15; BUN 32; Creatinine, Ser 0.93; Hemoglobin 12.0; Platelets 93; Potassium 4.4; Sodium 131   Recent Lipid Panel Lab Results  Component Value Date/Time   CHOL 140 01/27/2019  04:25 AM   TRIG 91 01/27/2019 04:25 AM   HDL 22 (L) 01/27/2019 04:25 AM   CHOLHDL 6.4 01/27/2019 04:25 AM   LDLCALC 100 (H) 01/27/2019 04:25 AM    Wt Readings from Last 3 Encounters:  07/21/19 185 lb (83.9 kg)  05/21/19 185 lb (83.9 kg)  02/16/19 203 lb (92.1 kg)     Objective:    Vital Signs:  BP 116/80    Pulse 80    Temp (!) 97.2 F (36.2 C)  SpO2 98%   Unable to examine ASSESSMENT & PLAN:     1. Choledocholithiasis   2. Nonischemic cardiomyopathy ([place])   3. Ventricular tachycardia ([place])   4. S/P TAVR (transcatheter aortic valve replacement)   5. Coronary artery disease involving native coronary artery of native heart without angina pectoris   6. Abdominal aortic aneurysm (AAA) without rupture ([place])   7. Encounter for monitoring amiodarone therapy     1. Choledocolithiasis/recurrent biliary sepsis: This caused multiple serious episodes of sepsis in 2020, but the last 12 months have been very quiet.  2. NICM/chronic systolic CHF: most recent EF 25-30%, no clinical evidence of heart failure, without the need for loop diuretics. He is not on any heart failure medications due to hypotension, but no longer requires midodrine either. He is still taking a potassium supplement (his wife and tells me the only administered 3 days a week). This may actually be necessary since he has some potassium losses through the biliary drain.  3. Ventricular tachycardia: In the past, attempts to discontinue amiodarone led to recurrent ventricular tachycardia. We have no ways of monitoring this anymore. For what it is worth, he has not experienced syncope.  4. Severe AS s/p TAVR - TEE 11/2018 showed normal AV prosthesis. He remains at high risk for prosthetic valve endocarditis and antibiotic should be administered promptly for any febrile illness and before any invasive procedures with risk of bacteremia. I do not think he is any shape to be transported for a follow-up echocardiogram.  5. CAD:  Moderate CAD by relatively recent cardiac catheterization.  He has not had angina pectoris.  6. AAA: He is not a candidate for repair and we will not be monitoring this routinely 4. 6 cm x 5.2cm by February 2020 ultrasound; CT around the same time showed advanced atherosclerotic calcifications involving the aorta. 4.6 x 4.0 cm infrarenal abdominal aortic aneurysm with a left-sided saccular component.   7.  Amiodarone: check TSH and CMET every 6 months. We will have labs on October 11 with Dr. Osborne Casco.   Patient Instructions  Medication Instructions:  No changes *If you need a refill on your cardiac medications before your next appointment, please call your pharmacy*   Lab Work: When you get your lab work collected at your PCP, make sure to include a CMET and TSH If you have labs (blood work) drawn today and your tests are completely normal, you will receive your results only by:  [place] (if you have MyChart) OR  A paper copy in the mail If you have any lab test that is abnormal or we need to change your treatment, we will call you to review the results.   Testing/Procedures: None   Follow-Up: At [facility], you and your health needs are our priority.  As part of our continuing mission to provide you with exceptional heart care, we have created designated Provider Care Teams.  These Care Teams include your primary Cardiologist (physician) and Advanced Practice Providers (APPs -  Physician Assistants and Nurse Practitioners) who all work together to provide you with the care you need, when you need it.  We recommend signing up for the patient portal called "MyChart".  Sign up information is provided on this After Visit Summary.  MyChart is used to connect with patients for Virtual Visits (Telemedicine).  Patients are able to view lab/test results, encounter notes, upcoming appointments, etc.  Non-urgent messages can be sent to your provider as well.   To learn more about  what you can do with MyChart, go to [email or web address].    Your next appointment:   12 month(s)  The format for your next appointment:   Virtual Visit   Provider:   Sanda Klein, MD   Other Instructions None    Signed, Sanda Klein, MD  07/11/2020 8:57 AM    [place]

## 2020-07-16 ENCOUNTER — Other Ambulatory Visit: Payer: Self-pay | Admitting: Gastroenterology

## 2020-07-24 DIAGNOSIS — R7301 Impaired fasting glucose: Secondary | ICD-10-CM | POA: Diagnosis not present

## 2020-07-24 DIAGNOSIS — E785 Hyperlipidemia, unspecified: Secondary | ICD-10-CM | POA: Diagnosis not present

## 2020-07-24 DIAGNOSIS — Z125 Encounter for screening for malignant neoplasm of prostate: Secondary | ICD-10-CM | POA: Diagnosis not present

## 2020-07-31 MED FILL — NORMAL SALINE FLUSH SYRINGE: 0.9 | 22 days supply | Qty: 900 | Fill #1

## 2020-08-01 DIAGNOSIS — I10 Essential (primary) hypertension: Secondary | ICD-10-CM | POA: Diagnosis not present

## 2020-08-01 DIAGNOSIS — R82998 Other abnormal findings in urine: Secondary | ICD-10-CM | POA: Diagnosis not present

## 2020-08-04 DIAGNOSIS — I714 Abdominal aortic aneurysm, without rupture: Secondary | ICD-10-CM | POA: Diagnosis not present

## 2020-08-04 DIAGNOSIS — I472 Ventricular tachycardia: Secondary | ICD-10-CM | POA: Diagnosis not present

## 2020-08-04 DIAGNOSIS — I480 Paroxysmal atrial fibrillation: Secondary | ICD-10-CM | POA: Diagnosis not present

## 2020-08-04 DIAGNOSIS — I350 Nonrheumatic aortic (valve) stenosis: Secondary | ICD-10-CM | POA: Diagnosis not present

## 2020-08-04 DIAGNOSIS — Z952 Presence of prosthetic heart valve: Secondary | ICD-10-CM | POA: Diagnosis not present

## 2020-08-04 DIAGNOSIS — I10 Essential (primary) hypertension: Secondary | ICD-10-CM | POA: Diagnosis not present

## 2020-08-04 DIAGNOSIS — E669 Obesity, unspecified: Secondary | ICD-10-CM | POA: Diagnosis not present

## 2020-08-04 DIAGNOSIS — K82A2 Perforation of gallbladder in cholecystitis: Secondary | ICD-10-CM | POA: Diagnosis not present

## 2020-08-04 DIAGNOSIS — E7800 Pure hypercholesterolemia, unspecified: Secondary | ICD-10-CM | POA: Diagnosis not present

## 2020-08-04 DIAGNOSIS — I429 Cardiomyopathy, unspecified: Secondary | ICD-10-CM | POA: Diagnosis not present

## 2020-08-04 DIAGNOSIS — R7301 Impaired fasting glucose: Secondary | ICD-10-CM | POA: Diagnosis not present

## 2020-08-04 DIAGNOSIS — Z0000 Encounter for general adult medical examination without abnormal findings: Secondary | ICD-10-CM | POA: Diagnosis not present

## 2020-08-12 DIAGNOSIS — Z23 Encounter for immunization: Secondary | ICD-10-CM | POA: Diagnosis not present

## 2020-09-11 ENCOUNTER — Other Ambulatory Visit (HOSPITAL_COMMUNITY): Payer: Medicare Other

## 2020-09-14 ENCOUNTER — Ambulatory Visit (HOSPITAL_COMMUNITY)
Admission: RE | Admit: 2020-09-14 | Discharge: 2020-09-14 | Disposition: A | Discharge status: Home / Self Care | Payer: Medicare Other | Source: Ambulatory Visit | Attending: Interventional Radiology | Admitting: Interventional Radiology

## 2020-09-14 ENCOUNTER — Other Ambulatory Visit: Payer: Self-pay

## 2020-09-14 ENCOUNTER — Other Ambulatory Visit (HOSPITAL_COMMUNITY): Payer: Self-pay | Admitting: Interventional Radiology

## 2020-09-14 DIAGNOSIS — K8050 Calculus of bile duct without cholangitis or cholecystitis without obstruction: Secondary | ICD-10-CM

## 2020-09-14 DIAGNOSIS — Z434 Encounter for attention to other artificial openings of digestive tract: Secondary | ICD-10-CM | POA: Diagnosis not present

## 2020-09-14 HISTORY — PX: IR EXCHANGE BILIARY DRAIN: IMG6046

## 2020-09-14 MED ORDER — IOHEXOL 300 MG/ML  SOLN
50.0000 mL | Freq: Once | INTRAMUSCULAR | Status: AC | PRN
  Administered 2020-09-14: 7 mL

## 2020-09-14 MED ORDER — LIDOCAINE HCL 1 % IJ SOLN
INTRAMUSCULAR | Status: AC
  Filled 2020-09-14: qty 20

## 2020-09-30 ENCOUNTER — Other Ambulatory Visit: Payer: Self-pay | Admitting: Cardiovascular Disease

## 2020-10-23 MED FILL — NORMAL SALINE FLUSH SYRINGE: 0.9 | 22 days supply | Qty: 900 | Fill #2

## 2020-10-24 ENCOUNTER — Telehealth (INDEPENDENT_AMBULATORY_CARE_PROVIDER_SITE_OTHER): Payer: Medicare Other | Admitting: Infectious Disease

## 2020-10-24 ENCOUNTER — Other Ambulatory Visit: Payer: Self-pay

## 2020-10-24 DIAGNOSIS — B952 Enterococcus as the cause of diseases classified elsewhere: Secondary | ICD-10-CM

## 2020-10-24 DIAGNOSIS — R7881 Bacteremia: Secondary | ICD-10-CM

## 2020-10-24 DIAGNOSIS — K8309 Other cholangitis: Secondary | ICD-10-CM | POA: Diagnosis not present

## 2020-10-24 DIAGNOSIS — B961 Klebsiella pneumoniae [K. pneumoniae] as the cause of diseases classified elsewhere: Secondary | ICD-10-CM

## 2020-10-24 DIAGNOSIS — U071 COVID-19: Secondary | ICD-10-CM

## 2020-10-24 DIAGNOSIS — K8050 Calculus of bile duct without cholangitis or cholecystitis without obstruction: Secondary | ICD-10-CM

## 2020-10-24 DIAGNOSIS — A498 Other bacterial infections of unspecified site: Secondary | ICD-10-CM

## 2020-10-24 NOTE — Progress Notes (Signed)
Virtual Visit via Telephone Note  I connected with William [name] and his wife on 10/24/20 at 10:45 AM EST  HCPO, his wife [name], retail by telephone and verified that I am speaking with the correct person using two identifiers.  Location: Patient: Home Provider: [facility]   I discussed the limitations, risks, security and privacy concerns of performing an evaluation and management service by telephone and the availability of in person appointments. I also discussed with the patient's surrogate (his wife)  that there may be a patient responsible charge related to this service. The patients' surrogate expressed understanding and agreed to proceed.   History of Present Illness:  87 y.o.malewithrecurrent episodes of cholangitis and sepsis at times with bacteremias including a time when he had to have his ICD removed, again remitted admitted with cholangitis and sepsis in the context of an obstructed cholecystostomy tube, and concern for potential obstruction of his biliary tree due to stones.  He was found not to be a surgical candidate which seems quite reasonable to me given his multiple comorbidities including his dementia.  GI were able to remove multiple stones during his admission and cultures from the biliary tree yielded ampicillin sensitive enterococcal species and an Enterobacter species.  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  Patient was met with with palliative care on multiple occasions but his family continued to want "aggressive care".  They have been seeming to want to have a second opinion from a surgeon which seems not very realistic especially in the current environment.  Ultimately arrangements were made for him to continue on the meropenem and later changed to  Augmentin and Bactrim and has done  well.   He has been seen by interventional radiology which have changed his drain.  They cultured his bile  which had multiple calculi in it but it did not appear purulent or not documented as such. Culture was taken which I woud NOT have preferred UNLESS it was purulent. 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  He was seen at [facility] and underwent 1. Percutaneous gallstone extraction.  2. Cystic duct recanalization and balloon dilation  3. Cholecystostomy tube exchange. on August the 4th, 2020  They found   1. Minimal residual cholelithiasis with successful clearance of the  remaining gallstones.  2. Focal stenosis and occlusion of the cystic duct with successful  recanalization and balloon dilation to 85m. The cystic duct is now widely  patent.  3. Choledocholithiasis without evidence of CBD obstruction.  4 Successful exchange of a new 16 french cholecystostomy tube.   Given the large amount of gallstones found in GB decision made to leave the cholecystostomy in place and have it exchanged every 3 months  I did  think we can trial him off of antibiotics this visit  The family wanted and have stopped the ACTIGALl though I would recommend continuing it. They are to talk with GI re this.  JKamanihad in the interim developed swelling of what sounds like his parotid gland that is been present for roughly 10 days.  IIt did  not improved he does not have fevers chills confusion or other systemic  symptoms.  I was concerned however he could have a MRSA infection in this area that might be resistant to the Bactrim that he is been taking chronically. I placed him on doxycycline and this resolved.   Since then he developed some sleepiness along with a low-grade fever.  His urine has become darker and they had  send it for analysis and culture with his primary care physician.  The organism isolated was fairly unusual 1 and was resistant to most antimicrobials that were orally available other than Macrobid.  He was initially prescribed Bactrim but then later Macrobid and apparently the urine "cleared up.  However his sleepiness has persisted may have even worsened in the last week.  He is also developed a pressure ulcer on his sacral area that was evaluated by wound care   He was seen by interventional radiology and who changed his drain and noted that he had now obstruction of his cystic duct by a stone.  I was apprehensive that he may be harboringInfection now at this obstructed site that could put him at risk for recurrent cholangitis and worse case scenario recurrent bacteremia.  I arrangde for him to be seen in clinic in late October 2020.  On exam in clinic he is fairly somnolent wearing a mask could  barely answer any questions and is wearing mittens to restrain him from pulling out his drain.   In December he began having fevers that turned out that this was the beginning of COVID-19 infection.  Both he and his wife tested positive.  I was able to help his wife get in contact with the monoclonal antibody infusion clinic and she received a dose of monoclonal antibodies.  They were not able to get Sirr out of the house to get him infused with antibodies.  He also was evaluated in the ER but felt stable for discharge back to home and he and his wife both recovered from this infection at home.  He  had no further evidence of recurrent cholangitis.  His drain is again been change in the frequency of pain exchanges been changed every 5 weeks.  He has been off antibiotics   Drain exchange changed to every 10 weeks   He has had a [employer] vaccine since last visit.  He unfortunately has been nonverbal and largely bedbound since having his COVID-19 infection last year.  He has not had  any evidence of infection at the biliary site.     Past Medical History:  Diagnosis Date  . AAA (abdominal aortic aneurysm) ([place])    7/13 3.8cm  . Arthritis    "joints" (01/11/2014)  . Asthma    "seasonal; some foods"   . Chronic systolic CHF (congestive heart failure) ([place])   . COVID-19 12/15/2019  . Dementia ([place])   . HLD (hyperlipidemia)   . HOH (hard of hearing)   . Lumbar vertebral fracture ([facility])    L1- 04/11/2014   . Parotitis 06/10/2019  . Prostate cancer ([place])   . S/P ICD (internal cardiac defibrillator) procedure, 01/11/14 removal of ERI gen and placement of Medtronic Evera XT VR & NEW Right ventricular lead Medtronic 01/12/2014  . S/P TAVR (transcatheter aortic valve replacement)    Edwards Sapien 3 THV (size 29 mm, model # B6411258, serial # [id number])  . Severe aortic stenosis   . Sleep apnea    "lost 60# & don't have it anymore" (01/11/2014)  . Somnolence 08/04/2019  . UTI (urinary tract infection) 08/04/2019  .  Ventricular tachycardia [facility])     Past Surgical History:  Procedure Laterality Date  . CARDIAC CATHETERIZATION  08/20/2004   noncritical CAD,mild global hypokinesis, EF 50%  . CARDIAC DEFIBRILLATOR PLACEMENT  08/23/2004   Medtronic  . CATARACT EXTRACTION W/ INTRAOCULAR LENS IMPLANT Right   . COLECTOMY  1990's  . CRYOABLATION N/A 02/24/2014   Procedure: CRYO ABLATION PROSTATE;  Surgeon: Ailene Rud, MD;  Location: [facility] ORS;  Service: Urology;  Laterality: N/A;  . ERCP N/A 01/29/2019   Procedure: ENDOSCOPIC RETROGRADE CHOLANGIOPANCREATOGRAPHY (ERCP);  Surgeon: Milus Banister, MD;  Location: [facility] ENDOSCOPY;  Service: Endoscopy;  Laterality: N/A;  . HERNIA REPAIR     "abdomen; from colon OR"  . ICD LEAD REMOVAL N/A 09/15/2018   Procedure: ICD LEAD REMOVAL EXTRACTION;  Surgeon: Evans Lance, MD;  Location: [facility] OR;  Service: Cardiovascular;  Laterality: N/A;  DR. BARTLE TO BACK UP  . IMPLANTABLE CARDIOVERTER DEFIBRILLATOR (ICD) GENERATOR CHANGE N/A 01/11/2014    Procedure: ICD GENERATOR CHANGE;  Surgeon: Sanda Klein, MD;  Location: [place] CATH LAB;  Service: Cardiovascular;  Laterality: N/A;  . IR CATHETER TUBE CHANGE  03/01/2019  . IR CATHETER TUBE CHANGE  04/05/2019  . IR CATHETER TUBE CHANGE  07/05/2019  . IR CATHETER TUBE CHANGE  08/02/2019  . IR CATHETER TUBE CHANGE  08/30/2019  . IR CATHETER TUBE CHANGE  09/27/2019  . IR CATHETER TUBE CHANGE  10/25/2019  . IR CATHETER TUBE CHANGE  11/29/2019  . IR EXCHANGE BILIARY DRAIN  12/18/2018  . IR EXCHANGE BILIARY DRAIN  01/05/2019  . IR EXCHANGE BILIARY DRAIN  01/25/2019  . IR EXCHANGE BILIARY DRAIN  01/13/2020  . IR EXCHANGE BILIARY DRAIN  02/28/2020  . IR EXCHANGE BILIARY DRAIN  04/24/2020  . IR EXCHANGE BILIARY DRAIN  07/03/2020  . IR EXCHANGE BILIARY DRAIN  09/14/2020  . IR PERC CHOLECYSTOSTOMY  11/22/2018  . IR RADIOLOGIST EVAL & MGMT  02/26/2019  . IR RADIOLOGIST EVAL & MGMT  03/17/2019  . IR SINUS/FIST TUBE CHK-NON GI  01/28/2019  . LEAD REVISION N/A 01/11/2014   Procedure: LEAD REVISION;  Surgeon: Sanda Klein, MD;  Location: [place] CATH LAB;  Service: Cardiovascular;  Laterality: N/A;  . NM MYOCAR PERF WALL MOTION  01/29/2012   abnormal c/o infarct/scar,no ischemia present  . PROSTATE BIOPSY N/A 11/22/2013   Procedure: PROSTATE BIOPSY AND ULTRASOUND;  Surgeon: Ailene Rud, MD;  Location: [facility];  Service: Urology;  Laterality: N/A;  . REMOVAL OF STONES  01/29/2019   Procedure: REMOVAL OF STONES;  Surgeon: Milus Banister, MD;  Location: [facility] ENDOSCOPY;  Service: Endoscopy;;  . RIGHT/LEFT HEART CATH AND CORONARY ANGIOGRAPHY N/A 07/06/2018   Procedure: RIGHT/LEFT HEART CATH AND CORONARY ANGIOGRAPHY;  Surgeon: Troy Sine, MD;  Location: [place] CV LAB;  Service: Cardiovascular;  Laterality: N/A;  . SPHINCTEROTOMY  01/29/2019   Procedure: SPHINCTEROTOMY;  Surgeon: Milus Banister, MD;  Location: [facility] ENDOSCOPY;  Service: Endoscopy;;  . TEE WITHOUT CARDIOVERSION N/A 08/18/2018   Procedure:  TRANSESOPHAGEAL ECHOCARDIOGRAM (TEE);  Surgeon: Burnell Blanks, MD;  Location: [place];  Service: Open Heart Surgery;  Laterality: N/A;  . TEE WITHOUT CARDIOVERSION N/A 09/08/2018   Procedure: TRANSESOPHAGEAL ECHOCARDIOGRAM (TEE);  Surgeon: Lelon Perla, MD;  Location: [facility] ENDOSCOPY;  Service: Cardiovascular;  Laterality: N/A;  . TEE WITHOUT CARDIOVERSION N/A 09/15/2018   Procedure: TRANSESOPHAGEAL ECHOCARDIOGRAM (TEE);  Surgeon: Evans Lance, MD;  Location: [place];  Service: Cardiovascular;  Laterality: N/A;  .  TEE WITHOUT CARDIOVERSION N/A 11/26/2018   Procedure: TRANSESOPHAGEAL ECHOCARDIOGRAM (TEE);  Surgeon: Jerline Pain, MD;  Location: [facility] ENDOSCOPY;  Service: Cardiovascular;  Laterality: N/A;  . TRANSCATHETER AORTIC VALVE REPLACEMENT, TRANSFEMORAL  08/18/2018  . TRANSCATHETER AORTIC VALVE REPLACEMENT, TRANSFEMORAL N/A 08/18/2018   Procedure: TRANSCATHETER AORTIC VALVE REPLACEMENT, TRANSFEMORAL using an Edwards 8mm Aortic Valve;  Surgeon: Burnell Blanks, MD;  Location: [place];  Service: Open Heart Surgery;  Laterality: N/A;    Family History  Problem Relation Age of Onset  . Heart failure Mother        Died of "old age" at 8  . Pneumonia Father       Social History   Socioeconomic History  . Marital status: Married    Spouse name: Not on file  . Number of children: 2  . Years of education: Not on file  . Highest education level: Not on file  Occupational History  . Occupation: Owned a Scientist, [employer]  Tobacco Use  . Smoking status: Never Smoker  . Smokeless tobacco: Never Used  Vaping Use  . Vaping Use: Never used  Substance and Sexual Activity  . Alcohol use: Not Currently    Alcohol/week: 0.0 standard drinks    Comment: 01/11/2014 "drink 1/2 of a beer twice per month   . Drug use: No  . Sexual activity: Not Currently  Other Topics Concern  . Not on file  Social History Narrative  . Not on file   Social Determinants of Health    Financial Resource Strain: Not on file  Food Insecurity: Not on file  Transportation Needs: Not on file  Physical Activity: Not on file  Stress: Not on file  Social Connections: Not on file    Allergies  Allergen Reactions  . Crestor [Rosuvastatin] Other (See Comments)    Hurts muscles  . Lipitor [Atorvastatin] Other (See Comments)    Hurts stomach  . Shrimp [Shellfish Allergy] Other (See Comments)    On MAR     Current Outpatient Medications:  .  acetaminophen (TYLENOL) 500 MG tablet, Take 500 mg by mouth every 6 (six) hours as needed (for pain). , Disp: , Rfl:  .  amiodarone (PACERONE) 200 MG tablet, TAKE 1 TABLET(200 MG) BY MOUTH DAILY, Disp: 45 tablet, Rfl: 3 .  aspirin EC 81 MG tablet, Take 81 mg by mouth 3 (three) times a week. , Disp: , Rfl:  .  cholecalciferol (VITAMIN D) 1000 units tablet, Take 2,000 Units by mouth at bedtime., Disp: , Rfl:  .  Cranberry-Vit C-Lactobacillus (RA CRANBERRY SUPPLEMENTS PO), Take by mouth in the morning and at bedtime. , Disp: , Rfl:  .  loratadine (CLARITIN) 10 MG tablet, Take 10 mg by mouth daily., Disp: , Rfl:  .  Multiple Vitamins-Minerals (PRESERVISION AREDS 2 PO), Take by mouth. Take one tablet by mouth dailey , Disp: , Rfl:  .  polyethylene glycol powder (GLYCOLAX/MIRALAX) 17 GM/SCOOP powder, MIX 17 GRAMS IN 8 OUNCES OF WATER EVERY DAY., Disp: 1020 g, Rfl: 6 .  potassium chloride (KLOR-CON) 10 MEQ tablet, Take 1 tablet (10 mEq total) by mouth daily. (Patient taking differently: Take 10 mEq by mouth 3 (three) times a week.), Disp: 90 tablet, Rfl: 1 .  ursodiol (ACTIGALL) 300 MG capsule, TAKE 2 CAPSULES(600 MG) BY MOUTH TWICE DAILY, Disp: 360 capsule, Rfl: 3 .  zinc gluconate 50 MG tablet, Take 50 mg by mouth daily., Disp: , Rfl:    Review of Systems  Unable to perform ROS:  Dementia           Assessment & Plan:     Observations/Objective:  Mr. Glennon Hamilton unfortunately has become progressively more bedbound and unresponsive  after his COVID-19 infection a year ago but family is still doing everything he can to keep him alive and to keep him at home.     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    He will continue to have drain exchange and we have scheduled a telephone visit visit again in next appointment in May  COVID prevention; I recommended he get Moderna vaccine with preferably 2 full doses     .Follow Up Instructions:    I discussed the assessment and treatment plan with the patient. The patient was provided an opportunity to ask questions and all were answered. The patient agreed with the plan and demonstrated an understanding of the instructions.   The patient was advised to call back or seek an in-person evaluation if the symptoms worsen or if the condition fails to improve as anticipated.  I provided 21 minutes of non-face-to-face time during this encounter.   Alcide Evener, MD

## 2020-11-23 ENCOUNTER — Other Ambulatory Visit (HOSPITAL_COMMUNITY): Payer: Medicare Other

## 2020-11-27 ENCOUNTER — Other Ambulatory Visit: Payer: Self-pay

## 2020-11-27 ENCOUNTER — Ambulatory Visit (HOSPITAL_COMMUNITY)
Admission: RE | Admit: 2020-11-27 | Discharge: 2020-11-27 | Disposition: A | Discharge status: Home / Self Care | Payer: Medicare Other | Source: Ambulatory Visit | Attending: Interventional Radiology | Admitting: Interventional Radiology

## 2020-11-27 DIAGNOSIS — Z434 Encounter for attention to other artificial openings of digestive tract: Secondary | ICD-10-CM | POA: Insufficient documentation

## 2020-11-27 DIAGNOSIS — K8050 Calculus of bile duct without cholangitis or cholecystitis without obstruction: Secondary | ICD-10-CM | POA: Diagnosis not present

## 2020-11-27 DIAGNOSIS — K831 Obstruction of bile duct: Secondary | ICD-10-CM | POA: Diagnosis not present

## 2020-11-27 HISTORY — PX: IR EXCHANGE BILIARY DRAIN: IMG6046

## 2020-11-27 MED ORDER — LIDOCAINE HCL 1 % IJ SOLN
INTRAMUSCULAR | Status: AC
  Filled 2020-11-27: qty 20

## 2020-11-27 MED ORDER — LIDOCAINE HCL 1 % IJ SOLN
INTRAMUSCULAR | Status: DC | PRN
  Administered 2020-11-27: 10 mL via INTRADERMAL

## 2020-11-27 MED ORDER — IOHEXOL 300 MG/ML  SOLN
50.0000 mL | Freq: Once | INTRAMUSCULAR | Status: AC | PRN
  Administered 2020-11-27: 8 mL

## 2020-11-28 ENCOUNTER — Other Ambulatory Visit (HOSPITAL_COMMUNITY): Payer: Medicare Other

## 2020-11-28 ENCOUNTER — Other Ambulatory Visit (HOSPITAL_COMMUNITY): Payer: Self-pay | Admitting: Interventional Radiology

## 2020-11-28 DIAGNOSIS — K8050 Calculus of bile duct without cholangitis or cholecystitis without obstruction: Secondary | ICD-10-CM

## 2020-11-30 ENCOUNTER — Inpatient Hospital Stay (HOSPITAL_COMMUNITY)
Admission: EM | Admit: 2020-11-30 | Discharge: 2020-12-03 | DRG: 689 | Disposition: A | Discharge status: Home Health | Payer: Medicare Other | Attending: Internal Medicine | Admitting: Internal Medicine

## 2020-11-30 ENCOUNTER — Encounter (HOSPITAL_COMMUNITY): Payer: Self-pay

## 2020-11-30 ENCOUNTER — Other Ambulatory Visit: Payer: Self-pay

## 2020-11-30 ENCOUNTER — Emergency Department (HOSPITAL_COMMUNITY): Payer: Medicare Other

## 2020-11-30 DIAGNOSIS — J9811 Atelectasis: Secondary | ICD-10-CM | POA: Diagnosis not present

## 2020-11-30 DIAGNOSIS — I350 Nonrheumatic aortic (valve) stenosis: Secondary | ICD-10-CM | POA: Diagnosis present

## 2020-11-30 DIAGNOSIS — Z8249 Family history of ischemic heart disease and other diseases of the circulatory system: Secondary | ICD-10-CM

## 2020-11-30 DIAGNOSIS — Z888 Allergy status to other drugs, medicaments and biological substances status: Secondary | ICD-10-CM

## 2020-11-30 DIAGNOSIS — C61 Malignant neoplasm of prostate: Secondary | ICD-10-CM | POA: Diagnosis present

## 2020-11-30 DIAGNOSIS — U071 COVID-19: Secondary | ICD-10-CM | POA: Diagnosis not present

## 2020-11-30 DIAGNOSIS — Z66 Do not resuscitate: Secondary | ICD-10-CM | POA: Diagnosis present

## 2020-11-30 DIAGNOSIS — I517 Cardiomegaly: Secondary | ICD-10-CM | POA: Diagnosis not present

## 2020-11-30 DIAGNOSIS — Z91013 Allergy to seafood: Secondary | ICD-10-CM

## 2020-11-30 DIAGNOSIS — B9620 Unspecified Escherichia coli [E. coli] as the cause of diseases classified elsewhere: Secondary | ICD-10-CM | POA: Diagnosis present

## 2020-11-30 DIAGNOSIS — Z7401 Bed confinement status: Secondary | ICD-10-CM

## 2020-11-30 DIAGNOSIS — I472 Ventricular tachycardia: Secondary | ICD-10-CM | POA: Diagnosis present

## 2020-11-30 DIAGNOSIS — N390 Urinary tract infection, site not specified: Secondary | ICD-10-CM | POA: Diagnosis present

## 2020-11-30 DIAGNOSIS — F0390 Unspecified dementia without behavioral disturbance: Secondary | ICD-10-CM | POA: Diagnosis present

## 2020-11-30 DIAGNOSIS — I5022 Chronic systolic (congestive) heart failure: Secondary | ICD-10-CM | POA: Diagnosis present

## 2020-11-30 DIAGNOSIS — I714 Abdominal aortic aneurysm, without rupture, unspecified: Secondary | ICD-10-CM | POA: Diagnosis present

## 2020-11-30 DIAGNOSIS — K8030 Calculus of bile duct with cholangitis, unspecified, without obstruction: Secondary | ICD-10-CM | POA: Diagnosis not present

## 2020-11-30 DIAGNOSIS — Z952 Presence of prosthetic heart valve: Secondary | ICD-10-CM

## 2020-11-30 DIAGNOSIS — R319 Hematuria, unspecified: Secondary | ICD-10-CM

## 2020-11-30 DIAGNOSIS — R000 Tachycardia, unspecified: Secondary | ICD-10-CM | POA: Diagnosis not present

## 2020-11-30 DIAGNOSIS — Z95810 Presence of automatic (implantable) cardiac defibrillator: Secondary | ICD-10-CM

## 2020-11-30 DIAGNOSIS — Z7982 Long term (current) use of aspirin: Secondary | ICD-10-CM

## 2020-11-30 DIAGNOSIS — A419 Sepsis, unspecified organism: Secondary | ICD-10-CM | POA: Diagnosis not present

## 2020-11-30 DIAGNOSIS — M1990 Unspecified osteoarthritis, unspecified site: Secondary | ICD-10-CM | POA: Diagnosis present

## 2020-11-30 DIAGNOSIS — I428 Other cardiomyopathies: Secondary | ICD-10-CM

## 2020-11-30 DIAGNOSIS — Z79899 Other long term (current) drug therapy: Secondary | ICD-10-CM

## 2020-11-30 DIAGNOSIS — K8044 Calculus of bile duct with chronic cholecystitis without obstruction: Secondary | ICD-10-CM | POA: Diagnosis present

## 2020-11-30 DIAGNOSIS — Z9049 Acquired absence of other specified parts of digestive tract: Secondary | ICD-10-CM

## 2020-11-30 DIAGNOSIS — Z8546 Personal history of malignant neoplasm of prostate: Secondary | ICD-10-CM

## 2020-11-30 DIAGNOSIS — N3001 Acute cystitis with hematuria: Secondary | ICD-10-CM | POA: Diagnosis not present

## 2020-11-30 DIAGNOSIS — J45909 Unspecified asthma, uncomplicated: Secondary | ICD-10-CM | POA: Diagnosis present

## 2020-11-30 DIAGNOSIS — E785 Hyperlipidemia, unspecified: Secondary | ICD-10-CM | POA: Diagnosis present

## 2020-11-30 DIAGNOSIS — G4730 Sleep apnea, unspecified: Secondary | ICD-10-CM | POA: Diagnosis present

## 2020-11-30 DIAGNOSIS — H9190 Unspecified hearing loss, unspecified ear: Secondary | ICD-10-CM | POA: Diagnosis present

## 2020-11-30 DIAGNOSIS — R54 Age-related physical debility: Secondary | ICD-10-CM | POA: Diagnosis present

## 2020-11-30 DIAGNOSIS — Z8619 Personal history of other infectious and parasitic diseases: Secondary | ICD-10-CM

## 2020-11-30 DIAGNOSIS — I2510 Atherosclerotic heart disease of native coronary artery without angina pectoris: Secondary | ICD-10-CM | POA: Diagnosis present

## 2020-11-30 LAB — APTT: aPTT: 23 seconds — ABNORMAL LOW (ref 24–36)

## 2020-11-30 LAB — CBC WITH DIFFERENTIAL/PLATELET
Abs Immature Granulocytes: 0.03 10*3/uL (ref 0.00–0.07)
Basophils Absolute: 0.1 10*3/uL (ref 0.0–0.1)
Basophils Relative: 1 %
Eosinophils Absolute: 0.6 10*3/uL — ABNORMAL HIGH (ref 0.0–0.5)
Eosinophils Relative: 8 %
HCT: 42.9 % (ref 39.0–52.0)
Hemoglobin: 15 g/dL (ref 13.0–17.0)
Immature Granulocytes: 0 %
Lymphocytes Relative: 18 %
Lymphs Abs: 1.4 10*3/uL (ref 0.7–4.0)
MCH: 33.3 pg (ref 26.0–34.0)
MCHC: 35 g/dL (ref 30.0–36.0)
MCV: 95.1 fL (ref 80.0–100.0)
Monocytes Absolute: 0.6 10*3/uL (ref 0.1–1.0)
Monocytes Relative: 8 %
Neutro Abs: 5 10*3/uL (ref 1.7–7.7)
Neutrophils Relative %: 65 %
Platelets: 149 10*3/uL — ABNORMAL LOW (ref 150–400)
RBC: 4.51 MIL/uL (ref 4.22–5.81)
RDW: 15.8 % — ABNORMAL HIGH (ref 11.5–15.5)
WBC: 7.7 10*3/uL (ref 4.0–10.5)
nRBC: 0 % (ref 0.0–0.2)

## 2020-11-30 LAB — URINALYSIS, ROUTINE W REFLEX MICROSCOPIC
Bilirubin Urine: NEGATIVE
Glucose, UA: NEGATIVE mg/dL
Ketones, ur: NEGATIVE mg/dL
Nitrite: NEGATIVE
Protein, ur: 100 mg/dL — AB
RBC / HPF: >50 RBC/hpf — ABNORMAL HIGH (ref 0–5)
Specific Gravity, Urine: 1.015 (ref 1.005–1.030)
WBC, UA: >50 WBC/hpf — ABNORMAL HIGH (ref 0–5)
pH: 6 (ref 5.0–8.0)

## 2020-11-30 LAB — COMPREHENSIVE METABOLIC PANEL
ALT: 13 U/L (ref 0–44)
AST: 17 U/L (ref 15–41)
Albumin: 2.9 g/dL — ABNORMAL LOW (ref 3.5–5.0)
Alkaline Phosphatase: 117 U/L (ref 38–126)
Anion gap: 11 (ref 5–15)
BUN: 14 mg/dL (ref 8–23)
CO2: 23 mmol/L (ref 22–32)
Calcium: 8.4 mg/dL — ABNORMAL LOW (ref 8.9–10.3)
Chloride: 98 mmol/L (ref 98–111)
Creatinine, Ser: 0.95 mg/dL (ref 0.61–1.24)
GFR, Estimated: >60 mL/min (ref >60)
Glucose, Bld: 136 mg/dL — ABNORMAL HIGH (ref 70–99)
Potassium: 4.2 mmol/L (ref 3.5–5.1)
Sodium: 132 mmol/L — ABNORMAL LOW (ref 135–145)
Total Bilirubin: 1.4 mg/dL — ABNORMAL HIGH (ref 0.3–1.2)
Total Protein: 7 g/dL (ref 6.5–8.1)

## 2020-11-30 LAB — LACTIC ACID, PLASMA
Lactic Acid, Venous: 1.3 mmol/L (ref 0.5–1.9)
Lactic Acid, Venous: 1.9 mmol/L (ref 0.5–1.9)

## 2020-11-30 LAB — RESP PANEL BY RT-PCR (FLU A&B, COVID) ARPGX2
Influenza A by PCR: NEGATIVE
Influenza B by PCR: NEGATIVE
SARS Coronavirus 2 by RT PCR: POSITIVE — AB

## 2020-11-30 LAB — PROTIME-INR
INR: 1 (ref 0.8–1.2)
Prothrombin Time: 13.2 seconds (ref 11.4–15.2)

## 2020-11-30 MED ORDER — SODIUM CHLORIDE 0.9 % IV SOLN: 2.0000 g | Freq: Two times a day (BID) | INTRAVENOUS | Status: DC

## 2020-11-30 MED ORDER — SODIUM CHLORIDE 0.9 % IV SOLN
100.0000 mg | Freq: Every day | INTRAVENOUS | Status: DC
  Filled 2020-11-30 (×2): qty 20

## 2020-11-30 MED ORDER — LACTATED RINGERS IV SOLN: INTRAVENOUS | Status: DC

## 2020-11-30 MED ORDER — SODIUM CHLORIDE 0.9 % IV SOLN
2.0000 g | Freq: Once | INTRAVENOUS | Status: AC
  Administered 2020-11-30: 2 g via INTRAVENOUS
  Filled 2020-11-30: qty 2

## 2020-11-30 MED ORDER — LACTATED RINGERS IV BOLUS (SEPSIS)
500.0000 mL | Freq: Once | INTRAVENOUS | Status: AC
  Administered 2020-11-30: 500 mL via INTRAVENOUS

## 2020-11-30 MED ORDER — SODIUM CHLORIDE 0.9 % IV SOLN
200.0000 mg | Freq: Once | INTRAVENOUS | Status: AC
  Administered 2020-12-01: 200 mg via INTRAVENOUS
  Filled 2020-11-30: qty 40

## 2020-11-30 NOTE — ED Provider Notes (Signed)
[facility] EMERGENCY DEPARTMENT Provider Note   CSN: [id number] Arrival date & time: 11/30/20  1946     History Chief Complaint  Patient presents with  . Hematuria    William [name] is a 87 y.o. male with past medical history of CHF, prostate cancer, dementia, hyperlipidemia, cholecystostomy tube in place, history of VRE, ESBL, who presents today for evaluation of hematuria. Patient's wife at bedside reports that he is mentally at his baseline. He ate well and was fine at 4 PM tonight however at 6 PM tonight when changed by one of his aides was noted to have blood on his brief. He has reportedly been "grabbing at" his groin today. No fevers noted prior to arrival.  Chart review shows he was last seen by Dr. Drucilla Schmidt with our CAD on 10/24/2020 and it appears at that point they decided to stop his antibiotics that he had been on due to multiple episodes of sepsis with cholangitis.  Level 5 caveat, patient nonverbal  HPI     Past Medical History:  Diagnosis Date  . AAA (abdominal aortic aneurysm) ([place])    7/13 3.8cm  . Arthritis    "joints" (01/11/2014)  . Asthma    "seasonal; some foods"   . Chronic systolic CHF (congestive heart failure) ([place])   . COVID-19 12/15/2019  . Dementia ([place])   . HLD (hyperlipidemia)   . HOH (hard of hearing)   . Lumbar vertebral fracture (HCC)    L1- 04/11/2014   . Parotitis 06/10/2019  . Prostate cancer ([place])   . S/P ICD (internal cardiac defibrillator) procedure, 01/11/14 removal of ERI gen and placement of Medtronic Evera XT VR & NEW Right ventricular lead Medtronic 01/12/2014  . S/P TAVR (transcatheter aortic valve replacement)    Edwards Sapien 3 THV (size 29 mm, model # [id number], serial # [id number])  . Severe aortic stenosis   . Sleep apnea    "lost 60# & don't have it anymore" (01/11/2014)  . Somnolence 08/04/2019  . UTI (urinary tract infection) 08/04/2019  . Ventricular tachycardia [facility])     Patient Active Problem List    Diagnosis Date Noted  . COVID-19 12/15/2019  . Tachycardia 08/18/2019  . PAC (premature atrial contraction) 08/18/2019  . UTI (urinary tract infection) 08/04/2019  . Somnolence 08/04/2019  . Parotitis 06/10/2019  . Hypotension 05/22/2019  . Chronic cholecystitis due to cholelithiasis with choledocholithiasis 02/18/2019  . Frail elderly 02/18/2019  . Debilitated patient 02/18/2019  . History of ventricular tachycardia 02/18/2019  . ACP (advance care planning) 02/18/2019  . Adult failure to thrive   . DNR (do not resuscitate)   . Choledocholithiasis   . Preoperative cardiovascular examination   . Palliative care by specialist   . Dementia without behavioral disturbance ([place]) 01/26/2019  . Transaminitis   . Biliary sepsis 01/24/2019  . Hyperglycemia 01/24/2019  . Acute metabolic encephalopathy 40/98/1191  . LFTs abnormal   . Constipation 01/08/2019  . Acute cholecystitis   . Cholangitis 11/25/2018  . Bacteremia due to Enterobacter species 11/24/2018  . Enterococcus faecalis infection 11/24/2018  . Pressure ulcer 11/22/2018  . Coronary artery disease involving native coronary artery of native heart without angina pectoris 09/22/2018  . Closed fracture of lower end of right ulna 09/22/2018  . Acute bacterial endocarditis   . Infected defibrillator ([place])   . Bacteremia due to Klebsiella pneumoniae   . S/P TAVR (transcatheter aortic valve replacement) 08/18/2018  . Ventricular tachycardia ([place])   . Dementia ([place])   .  Chronic systolic CHF (congestive heart failure) ([place]) 08/07/2018  . Severe aortic stenosis   . Prostate cancer ([place]) 01/20/2014  . S/P ICD (internal cardiac defibrillator) procedure 01/12/2014  . Nonischemic cardiomyopathy ([place]) 05/28/2013  . Hyperlipidemia 05/28/2013  . AAA (abdominal aortic aneurysm) ([place]) 05/28/2013    Past Surgical History:  Procedure Laterality Date  . CARDIAC CATHETERIZATION  08/20/2004   noncritical CAD,mild global hypokinesis, EF 50%   . CARDIAC DEFIBRILLATOR PLACEMENT  08/23/2004   Medtronic  . CATARACT EXTRACTION W/ INTRAOCULAR LENS IMPLANT Right   . COLECTOMY  1990's  . CRYOABLATION N/A 02/24/2014   Procedure: CRYO ABLATION PROSTATE;  Surgeon: Ailene Rud, MD;  Location: [facility] ORS;  Service: Urology;  Laterality: N/A;  . ERCP N/A 01/29/2019   Procedure: ENDOSCOPIC RETROGRADE CHOLANGIOPANCREATOGRAPHY (ERCP);  Surgeon: Milus Banister, MD;  Location: [facility]COPY;  Service: Endoscopy;  Laterality: N/A;  . HERNIA REPAIR     "abdomen; from colon OR"  . ICD LEAD REMOVAL N/A 09/15/2018   Procedure: ICD LEAD REMOVAL EXTRACTION;  Surgeon: Evans Lance, MD;  Location: [facility] OR;  Service: Cardiovascular;  Laterality: N/A;  DR. BARTLE TO BACK UP  . IMPLANTABLE CARDIOVERTER DEFIBRILLATOR (ICD) GENERATOR CHANGE N/A 01/11/2014   Procedure: ICD GENERATOR CHANGE;  Surgeon: Sanda Klein, MD;  Location: [place] CATH LAB;  Service: Cardiovascular;  Laterality: N/A;  . IR CATHETER TUBE CHANGE  03/01/2019  . IR CATHETER TUBE CHANGE  04/05/2019  . IR CATHETER TUBE CHANGE  07/05/2019  . IR CATHETER TUBE CHANGE  08/02/2019  . IR CATHETER TUBE CHANGE  08/30/2019  . IR CATHETER TUBE CHANGE  09/27/2019  . IR CATHETER TUBE CHANGE  10/25/2019  . IR CATHETER TUBE CHANGE  11/29/2019  . IR EXCHANGE BILIARY DRAIN  12/18/2018  . IR EXCHANGE BILIARY DRAIN  01/05/2019  . IR EXCHANGE BILIARY DRAIN  01/25/2019  . IR EXCHANGE BILIARY DRAIN  01/13/2020  . IR EXCHANGE BILIARY DRAIN  02/28/2020  . IR EXCHANGE BILIARY DRAIN  04/24/2020  . IR EXCHANGE BILIARY DRAIN  07/03/2020  . IR EXCHANGE BILIARY DRAIN  09/14/2020  . IR EXCHANGE BILIARY DRAIN  11/27/2020  . IR PERC CHOLECYSTOSTOMY  11/22/2018  . IR RADIOLOGIST EVAL & MGMT  02/26/2019  . IR RADIOLOGIST EVAL & MGMT  03/17/2019  . IR SINUS/FIST TUBE CHK-NON GI  01/28/2019  . LEAD REVISION N/A 01/11/2014   Procedure: LEAD REVISION;  Surgeon: Sanda Klein, MD;  Location: [facility] CATH LAB;  Service: Cardiovascular;  Laterality:  N/A;  . NM MYOCAR PERF WALL MOTION  01/29/2012   abnormal c/o infarct/scar,no ischemia present  . PROSTATE BIOPSY N/A 11/22/2013   Procedure: PROSTATE BIOPSY AND ULTRASOUND;  Surgeon: Ailene Rud, MD;  Location: [facility] ORS;  Service: Urology;  Laterality: N/A;  . REMOVAL OF STONES  01/29/2019   Procedure: REMOVAL OF STONES;  Surgeon: Milus Banister, MD;  Location: [facility] ENDOSCOPY;  Service: Endoscopy;;  . RIGHT/LEFT HEART CATH AND CORONARY ANGIOGRAPHY N/A 07/06/2018   Procedure: RIGHT/LEFT HEART CATH AND CORONARY ANGIOGRAPHY;  Surgeon: Troy Sine, MD;  Location: [place] CV LAB;  Service: Cardiovascular;  Laterality: N/A;  . SPHINCTEROTOMY  01/29/2019   Procedure: SPHINCTEROTOMY;  Surgeon: Milus Banister, MD;  Location: [facility] ENDOSCOPY;  Service: Endoscopy;;  . TEE WITHOUT CARDIOVERSION N/A 08/18/2018   Procedure: TRANSESOPHAGEAL ECHOCARDIOGRAM (TEE);  Surgeon: Burnell Blanks, MD;  Location: [place];  Service: Open Heart Surgery;  Laterality: N/A;  . TEE WITHOUT CARDIOVERSION N/A 09/08/2018  Procedure: TRANSESOPHAGEAL ECHOCARDIOGRAM (TEE);  Surgeon: Lelon Perla, MD;  Location: [facility] ENDOSCOPY;  Service: Cardiovascular;  Laterality: N/A;  . TEE WITHOUT CARDIOVERSION N/A 09/15/2018   Procedure: TRANSESOPHAGEAL ECHOCARDIOGRAM (TEE);  Surgeon: Evans Lance, MD;  Location: [facility] OR;  Service: Cardiovascular;  Laterality: N/A;  . TEE WITHOUT CARDIOVERSION N/A 11/26/2018   Procedure: TRANSESOPHAGEAL ECHOCARDIOGRAM (TEE);  Surgeon: Jerline Pain, MD;  Location: [facility] ENDOSCOPY;  Service: Cardiovascular;  Laterality: N/A;  . TRANSCATHETER AORTIC VALVE REPLACEMENT, TRANSFEMORAL  08/18/2018  . TRANSCATHETER AORTIC VALVE REPLACEMENT, TRANSFEMORAL N/A 08/18/2018   Procedure: TRANSCATHETER AORTIC VALVE REPLACEMENT, TRANSFEMORAL using an Edwards 63mm Aortic Valve;  Surgeon: Burnell Blanks, MD;  Location: [place];  Service: Open Heart Surgery;  Laterality: N/A;       Family History   Problem Relation Age of Onset  . Heart failure Mother        Died of "old age" at 66  . Pneumonia Father     Social History   Tobacco Use  . Smoking status: Never Smoker  . Smokeless tobacco: Never Used  Vaping Use  . Vaping Use: Never used  Substance Use Topics  . Alcohol use: Not Currently    Alcohol/week: 0.0 standard drinks    Comment: 01/11/2014 "drink 1/2 of a beer twice per month   . Drug use: No    Home Medications Prior to Admission medications   Medication Sig Start Date End Date Taking? Authorizing Provider  acetaminophen (TYLENOL) 500 MG tablet Take 500 mg by mouth every 6 (six) hours as needed (for pain).     [provider]  amiodarone (PACERONE) 200 MG tablet TAKE 1 TABLET(200 MG) BY MOUTH DAILY 10/02/20   Croitoru, Mihai, MD  aspirin EC 81 MG tablet Take 81 mg by mouth 3 (three) times a week.     [provider]  cholecalciferol (VITAMIN D) 1000 units tablet Take 2,000 Units by mouth at bedtime.    [provider]  Cranberry-Vit C-Lactobacillus (RA CRANBERRY SUPPLEMENTS PO) Take by mouth in the morning and at bedtime.     [provider]  loratadine (CLARITIN) 10 MG tablet Take 10 mg by mouth daily.    [provider]  Multiple Vitamins-Minerals (PRESERVISION AREDS 2 PO) Take by mouth. Take one tablet by mouth dailey     [provider]  polyethylene glycol powder (GLYCOLAX/MIRALAX) 17 GM/SCOOP powder MIX 17 GRAMS IN 8 OUNCES OF WATER EVERY DAY. 03/24/19   Jessy Oto, MD  potassium chloride (KLOR-CON) 10 MEQ tablet Take 1 tablet (10 mEq total) by mouth daily. Patient taking differently: Take 10 mEq by mouth 3 (three) times a week. 08/16/19   Croitoru, Mihai, MD  ursodiol (ACTIGALL) 300 MG capsule TAKE 2 CAPSULES(600 MG) BY MOUTH TWICE DAILY 07/17/20   Milus Banister, MD  zinc gluconate 50 MG tablet Take 50 mg by mouth daily.    [provider]    Allergies    Crestor [rosuvastatin], Lipitor  [atorvastatin], and Shrimp [shellfish allergy]  Review of Systems   Review of Systems  Unable to perform ROS: Dementia    Physical Exam Updated Vital Signs BP 123/84   Pulse 96   Temp 99.3 F (37.4 C) (Rectal)   Resp (!) 32   SpO2 96%   Physical Exam Vitals and nursing note reviewed. Exam conducted with a chaperone present.  Constitutional:      General: He is not in acute distress.    Appearance: He is  ill-appearing (Chronic). He is not diaphoretic.  HENT:     Head: Normocephalic and atraumatic.  Eyes:     General: No scleral icterus.       Right eye: No discharge.        Left eye: No discharge.     Conjunctiva/sclera: Conjunctivae normal.  Cardiovascular:     Rate and Rhythm: Tachycardia present. Rhythm irregular.     Heart sounds: Murmur heard.    Pulmonary:     Effort: Tachypnea and accessory muscle usage (Mild,) present. No respiratory distress.     Breath sounds: Normal breath sounds. No stridor.  Abdominal:     General: Abdomen is protuberant. There is no distension.     Palpations: Abdomen is soft.     Tenderness: There is no abdominal tenderness.     Comments: Gallbladder drain in lateral right upper abdomen.  Drainage and drainage bag, drain appears grossly intact  Genitourinary:    Comments: There is what appears to be red urine in the brief, and during exam there is a small amount of thin red urine dripping from the meatus.  No visualized tears.  There is a superficial ulcer on the glans of the penis under the foreskin and moderate amounts of smegma/debris present.  Foreskin was returned to anatomic position after exam. No visualized wounds or lesions on scrotum.  Musculoskeletal:        General: No deformity.     Cervical back: Normal range of motion and neck supple.  Skin:    General: Skin is warm and dry.  Neurological:     Mental Status: He is alert. Mental status is at baseline.     Motor: No abnormal muscle tone.     Comments: Patient is awake.  He  is nonverbal.  He turns his head when I speak however does not make attempts to interact.    Psychiatric:     Comments: Unable to assess, patient is non interactive and non verbal.      ED Results / Procedures / Treatments   Labs (all labs ordered are listed, but only abnormal results are displayed) Labs Reviewed  COMPREHENSIVE METABOLIC PANEL - Abnormal; Notable for the following components:      Result Value   Sodium 132 (*)    Glucose, Bld 136 (*)    Calcium 8.4 (*)    Albumin 2.9 (*)    Total Bilirubin 1.4 (*)    All other components within normal limits  CBC WITH DIFFERENTIAL/PLATELET - Abnormal; Notable for the following components:   RDW 15.8 (*)    Platelets 149 (*)    Eosinophils Absolute 0.6 (*)    All other components within normal limits  APTT - Abnormal; Notable for the following components:   aPTT 23 (*)    All other components within normal limits  RESP PANEL BY RT-PCR (FLU A&B, COVID) ARPGX2  CULTURE, BLOOD (ROUTINE X 2)  CULTURE, BLOOD (ROUTINE X 2)  URINE CULTURE  LACTIC ACID, PLASMA  PROTIME-INR  LACTIC ACID, PLASMA  URINALYSIS, ROUTINE W REFLEX MICROSCOPIC    EKG EKG Interpretation  Date/Time:  Thursday November 30 2020 21:11:31 EST Ventricular Rate:  98 PR Interval:    QRS Duration: 128 QT Interval:  398 QTC Calculation: 521 R Axis:   -40 Text Interpretation: Left bundle branch block No significant change since last tracing Confirmed by Isla Pence [phone]) on 11/30/2020 9:20:31 PM   Radiology DG Chest Port 1 View  Result Date: 11/30/2020 CLINICAL DATA:  Questionable sepsis.  Blood in urine. EXAM: PORTABLE CHEST 1 VIEW COMPARISON:  Most recent radiograph 10/13/2019 FINDINGS: Stable low lung volumes. Chronic cardiomegaly. Post TAVR. Aortic atherosclerosis and tortuosity. Streaky bibasilar opacities typically atelectasis. Elevation of left hemidiaphragm, increased. The patient's chin obscures the apices. No visualized pneumothorax or large  pleural effusion. No pulmonary edema. IMPRESSION: 1. Low lung volumes with bibasilar opacities, typically atelectasis. 2. Increased elevation of the left hemidiaphragm likely due to atelectasis. 3. Chronic cardiomegaly.  Post TAVR. Aortic Atherosclerosis (ICD10-I70.0). Electronically Signed   By: Keith Rake M.D.   On: 11/30/2020 20:23    Procedures .Critical Care Performed by: Lorin Glass, PA-C Authorized by: Lorin Glass, PA-C   Critical care provider statement:    Critical care time (minutes):  45   Critical care was necessary to treat or prevent imminent or life-threatening deterioration of the following conditions:  Sepsis   Critical care was time spent personally by me on the following activities:  Discussions with consultants, evaluation of patient's response to treatment, examination of patient, ordering and performing treatments and interventions, ordering and review of laboratory studies, ordering and review of radiographic studies, pulse oximetry, re-evaluation of patient's condition, obtaining history from patient or surrogate and review of old charts     Medications Ordered in ED Medications  lactated ringers infusion (has no administration in time range)  ceFEPIme (MAXIPIME) 2 g in sodium chloride 0.9 % 100 mL IVPB (2 g Intravenous New Bag/Given 11/30/20 2143)  ceFEPIme (MAXIPIME) 2 g in sodium chloride 0.9 % 100 mL IVPB (has no administration in time range)  lactated ringers bolus 500 mL (0 mLs Intravenous Stopped 11/30/20 2057)    ED Course  I have reviewed the triage vital signs and the nursing notes.  Pertinent labs & imaging results that were available during my care of the patient were reviewed by me and considered in my medical decision making (see chart for details).  Clinical Course as of 11/30/20 2150  Thu Nov 30, 2020  2058 Sepsis re-evaluation complete.  Lactic acid is not elevated, No leukocytosis.  UA still pending.  Patient does not  require 88ml/kg at this time.   [EH]    Clinical Course User Index [EH] Ollen Gross   MDM Rules/Calculators/A&P                         Patient is a 87 year old male who is nonverbal who presents today for concern of hematuria.  He is afebrile however his heart rate is consistently over 90 and he is tachypneic.  This with hematuria raises concern for UTI and possible sepsis, especially given his history of VRE and ESBL.  He is at his baseline according to family at bedside.  White count is not elevated.  Hemoglobin is normal.  Platelets are slightly low at 149.  CMP shows mild hyponatremia with a sodium of 132.  Total bili is elevated at 1.4.  Lactic acid is not elevated over 2 and he is not hypotensive therefore he does not require 30/kg fluid bolus.  Blood cultures and urine cultures along with Covid test are ordered.  Chest x-ray without consolidation, pneumothorax or other acute abnormalities.  At shift change care was transferred to Dr. Gilford Raid who will follow pending studies, re-evaulate and determine disposition.    Note: Portions of this report may have been transcribed using voice recognition software. Every effort was made to ensure accuracy; however, inadvertent computerized transcription errors  may be present   Final Clinical Impression(s) / ED Diagnoses Final diagnoses:  Hematuria, unspecified type    Rx / DC Orders ED Discharge Orders    None       Ollen Gross 11/30/20 2151    Isla Pence, MD 11/30/20 2303

## 2020-11-30 NOTE — Sepsis Progress Note (Signed)
Following for sepsis monitoring ?

## 2020-11-30 NOTE — ED Triage Notes (Addendum)
Pt coming from home with EMS after home aide stated that pt had blood and clots coming out of penis. Home health aide also states that he is grabbing at his groin a lot today. Pt noted to have gallblader drain on RT side. Pt is non-verbal at baseline; ate well today per aide.

## 2020-11-30 NOTE — Progress Notes (Addendum)
Pharmacy Antibiotic Note  ALLYN BERTONI is a 87 y.o. male admitted on 11/30/2020 with possible UTI.  When home health aide was changing patient, she noticed blood in his briefs. Patient currently has bile drain in place and has grown VRE in the past. Pharmacy has been consulted for cefepime dosing. Given this is a different site of infection, will continue with cefepime and f/u cultures. Tm 99.3, lactic acid 1.3, CBC pending.  Plan: Cefepime 2g IV q12 hours (est CrCl ~51) F/u cultures Monitor LOT, clinical improvement, renal function     Temp (24hrs), Avg:99.3 F (37.4 C), Min:99.3 F (37.4 C), Max:99.3 F (37.4 C)  No results for input(s): WBC, CREATININE, LATICACIDVEN, VANCOTROUGH, VANCOPEAK, VANCORANDOM, GENTTROUGH, GENTPEAK, GENTRANDOM, TOBRATROUGH, TOBRAPEAK, TOBRARND, AMIKACINPEAK, AMIKACINTROU, AMIKACIN in the last 168 hours.  CrCl cannot be calculated (Patient's most recent lab result is older than the maximum 21 days allowed.).    Allergies  Allergen Reactions  . Crestor [Rosuvastatin] Other (See Comments)    Hurts muscles  . Lipitor [Atorvastatin] Other (See Comments)    Hurts stomach  . Shrimp [Shellfish Allergy] Other (See Comments)    On MAR    Antimicrobials this admission: Cefepime 2/17 >>    Microbiology results: 2/17 BCx: sent 2/17 UCx: ordered   Thank you for allowing pharmacy to be a part of this patient's care.  Dimple Nanas, PharmD PGY-1 Acute Care Pharmacy Resident Office: [phone] 11/30/2020 8:35 PM

## 2020-12-01 ENCOUNTER — Encounter (HOSPITAL_COMMUNITY): Payer: Self-pay | Admitting: Internal Medicine

## 2020-12-01 DIAGNOSIS — F0390 Unspecified dementia without behavioral disturbance: Secondary | ICD-10-CM | POA: Diagnosis present

## 2020-12-01 DIAGNOSIS — Z743 Need for continuous supervision: Secondary | ICD-10-CM | POA: Diagnosis not present

## 2020-12-01 DIAGNOSIS — K8030 Calculus of bile duct with cholangitis, unspecified, without obstruction: Secondary | ICD-10-CM | POA: Diagnosis present

## 2020-12-01 DIAGNOSIS — I2510 Atherosclerotic heart disease of native coronary artery without angina pectoris: Secondary | ICD-10-CM | POA: Diagnosis present

## 2020-12-01 DIAGNOSIS — J45909 Unspecified asthma, uncomplicated: Secondary | ICD-10-CM | POA: Diagnosis present

## 2020-12-01 DIAGNOSIS — Z95810 Presence of automatic (implantable) cardiac defibrillator: Secondary | ICD-10-CM | POA: Diagnosis not present

## 2020-12-01 DIAGNOSIS — K8044 Calculus of bile duct with chronic cholecystitis without obstruction: Secondary | ICD-10-CM | POA: Diagnosis present

## 2020-12-01 DIAGNOSIS — N3001 Acute cystitis with hematuria: Principal | ICD-10-CM

## 2020-12-01 DIAGNOSIS — R319 Hematuria, unspecified: Secondary | ICD-10-CM | POA: Diagnosis not present

## 2020-12-01 DIAGNOSIS — U071 COVID-19: Secondary | ICD-10-CM | POA: Diagnosis present

## 2020-12-01 DIAGNOSIS — I428 Other cardiomyopathies: Secondary | ICD-10-CM | POA: Diagnosis present

## 2020-12-01 DIAGNOSIS — Z66 Do not resuscitate: Secondary | ICD-10-CM | POA: Diagnosis present

## 2020-12-01 DIAGNOSIS — E785 Hyperlipidemia, unspecified: Secondary | ICD-10-CM | POA: Diagnosis present

## 2020-12-01 DIAGNOSIS — I714 Abdominal aortic aneurysm, without rupture: Secondary | ICD-10-CM | POA: Diagnosis present

## 2020-12-01 DIAGNOSIS — B9620 Unspecified Escherichia coli [E. coli] as the cause of diseases classified elsewhere: Secondary | ICD-10-CM | POA: Diagnosis present

## 2020-12-01 DIAGNOSIS — J9811 Atelectasis: Secondary | ICD-10-CM | POA: Diagnosis present

## 2020-12-01 DIAGNOSIS — I5022 Chronic systolic (congestive) heart failure: Secondary | ICD-10-CM | POA: Diagnosis present

## 2020-12-01 DIAGNOSIS — M1990 Unspecified osteoarthritis, unspecified site: Secondary | ICD-10-CM | POA: Diagnosis present

## 2020-12-01 DIAGNOSIS — Z8619 Personal history of other infectious and parasitic diseases: Secondary | ICD-10-CM | POA: Diagnosis not present

## 2020-12-01 DIAGNOSIS — G4730 Sleep apnea, unspecified: Secondary | ICD-10-CM | POA: Diagnosis present

## 2020-12-01 DIAGNOSIS — R5381 Other malaise: Secondary | ICD-10-CM | POA: Diagnosis not present

## 2020-12-01 DIAGNOSIS — R54 Age-related physical debility: Secondary | ICD-10-CM | POA: Diagnosis present

## 2020-12-01 DIAGNOSIS — C61 Malignant neoplasm of prostate: Secondary | ICD-10-CM

## 2020-12-01 DIAGNOSIS — Z952 Presence of prosthetic heart valve: Secondary | ICD-10-CM | POA: Diagnosis not present

## 2020-12-01 DIAGNOSIS — I472 Ventricular tachycardia: Secondary | ICD-10-CM | POA: Diagnosis present

## 2020-12-01 DIAGNOSIS — I350 Nonrheumatic aortic (valve) stenosis: Secondary | ICD-10-CM | POA: Diagnosis present

## 2020-12-01 DIAGNOSIS — Z7401 Bed confinement status: Secondary | ICD-10-CM | POA: Diagnosis not present

## 2020-12-01 DIAGNOSIS — Z8546 Personal history of malignant neoplasm of prostate: Secondary | ICD-10-CM | POA: Diagnosis not present

## 2020-12-01 DIAGNOSIS — R279 Unspecified lack of coordination: Secondary | ICD-10-CM | POA: Diagnosis not present

## 2020-12-01 MED ORDER — URSODIOL 300 MG PO CAPS
600.0000 mg | ORAL_CAPSULE | Freq: Two times a day (BID) | ORAL | Status: DC
  Administered 2020-12-01 – 2020-12-03 (×5): 600 mg via ORAL
  Filled 2020-12-01 (×6): qty 2

## 2020-12-01 MED ORDER — AMIODARONE HCL 200 MG PO TABS
200.0000 mg | ORAL_TABLET | Freq: Every day | ORAL | Status: DC
  Administered 2020-12-01 – 2020-12-03 (×3): 200 mg via ORAL
  Filled 2020-12-01 (×3): qty 1

## 2020-12-01 MED ORDER — ACETAMINOPHEN 325 MG PO TABS: 650.0000 mg | ORAL_TABLET | Freq: Four times a day (QID) | ORAL | Status: DC | PRN

## 2020-12-01 MED ORDER — ACETAMINOPHEN 650 MG RE SUPP: 650.0000 mg | Freq: Four times a day (QID) | RECTAL | Status: DC | PRN

## 2020-12-01 MED ORDER — SODIUM CHLORIDE 0.9 % IV SOLN
100.0000 mg | Freq: Every day | INTRAVENOUS | Status: AC
  Administered 2020-12-02 – 2020-12-03 (×2): 100 mg via INTRAVENOUS
  Filled 2020-12-01 (×2): qty 20

## 2020-12-01 MED ORDER — LORATADINE 10 MG PO TABS
10.0000 mg | ORAL_TABLET | Freq: Every day | ORAL | Status: DC
  Administered 2020-12-01 – 2020-12-03 (×3): 10 mg via ORAL
  Filled 2020-12-01 (×3): qty 1

## 2020-12-01 MED ORDER — SODIUM CHLORIDE 0.9 % IV SOLN
1.0000 g | Freq: Three times a day (TID) | INTRAVENOUS | Status: DC
  Administered 2020-12-01 – 2020-12-02 (×4): 1 g via INTRAVENOUS
  Filled 2020-12-01 (×7): qty 1

## 2020-12-01 MED ORDER — ZINC GLUCONATE 50 MG PO TABS: 50.0000 mg | ORAL_TABLET | Freq: Every day | ORAL | Status: DC

## 2020-12-01 NOTE — Progress Notes (Signed)
Pharmacy Antibiotic Note  William [name] is a 87 y.o. male admitted on 11/30/2020 with acute cystitis w/ known h/o ESBL and VRE.  Pharmacy has been consulted for meropenem dosing.  Plan: Meropenem 1g IV Q8H.  Height: 5\' 10"  (177.8 cm) Weight: 81.6 kg (180 lb) IBW/kg (Calculated) : 73  Temp (24hrs), Avg:99.1 F (37.3 C), Min:98.9 F (37.2 C), Max:99.3 F (37.4 C)  Recent Labs  Lab 11/30/20 2005 11/30/20 2153  WBC 7.7  --   CREATININE 0.95  --   LATICACIDVEN 1.3 1.9    Estimated Creatinine Clearance: 52.3 mL/min (by C-G formula based on SCr of 0.95 mg/dL).    Allergies  Allergen Reactions  . Crestor [Rosuvastatin] Other (See Comments)    Hurts muscles  . Lipitor [Atorvastatin] Other (See Comments)    Hurts stomach  . Shrimp [Shellfish Allergy] Other (See Comments)    On MAR     Thank you for allowing pharmacy to be a part of this patient's care.  Wynona Neat, PharmD, BCPS  12/01/2020 2:46 AM

## 2020-12-01 NOTE — Progress Notes (Signed)
PHARMACIST - PHYSICIAN ORDER COMMUNICATION  CONCERNING: P&T Medication Policy on Herbal Medications  DESCRIPTION:  This patient's order for:  Zinc gluconate  has been noted.  This product(s) is classified as an "herbal" or natural product. Due to a lack of definitive safety studies or FDA approval, nonstandard manufacturing practices, plus the potential risk of unknown drug-drug interactions while on inpatient medications, the Pharmacy and Therapeutics Committee does not permit the use of "herbal" or natural products of this type within [facility].   ACTION TAKEN: The pharmacy department is unable to verify this order at this time and your patient has been informed of this safety policy. Please reevaluate patient's clinical condition at discharge and address if the herbal or natural product(s) should be resumed at that time.  Sherlon Handing, PharmD, BCPS Please see amion for complete clinical pharmacist phone list 12/01/2020 2:22 AM

## 2020-12-01 NOTE — H&P (Signed)
History and Physical    Constant [name] IRJ:[id number] DOB: [birth year] DOA: 11/30/2020  PCP: Gayland Curry, DO  Patient coming from: Home.  History obtained from patient's daughter and patient's wife.  Patient has dementia.  Chief Complaint: Hematuria.  HPI: William [name] is a 87 y.o. male with history of nonischemic cardiomyopathy, dementia, recurrent cholangitis with history of gallstones status post cholecystostomy tube recent drain change on November 27, 2020 was noticed to have hematuria at home around last evening.  Otherwise patient did not have any fever chills abdominal pain nausea vomiting or diarrhea.  Patient is usually bedbound.  ED Course: In the ER patient had UA which shows features concerning for UTI.  ER physician also felt that patient may be having some signs of early sepsis.  Chest x-ray showed nonspecific changes patient's Covid test was positive.  Labs are largely unremarkable.  Patient had cultures drawn and started on meropenem since patient has history of ESBL.  Review of Systems: As per HPI, rest all negative.   Past Medical History:  Diagnosis Date  . AAA (abdominal aortic aneurysm) ([place])    7/13 3.8cm  . Arthritis    "joints" (01/11/2014)  . Asthma    "seasonal; some foods"   . Chronic systolic CHF (congestive heart failure) ([place])   . COVID-19 12/15/2019  . Dementia ([place])   . HLD (hyperlipidemia)   . HOH (hard of hearing)   . Lumbar vertebral fracture (H[facility])    L1- 04/11/2014   . Parotitis 06/10/2019  . Prostate cancer ([place])   . S/P ICD (internal cardiac defibrillator) procedure, 01/11/14 removal of ERI gen and placement of Medtronic Evera XT VR & NEW Right ventricular lead Medtronic 01/12/2014  . S/P TAVR (transcatheter aortic valve replacement)    Edwards Sapien 3 THV (size 29 mm, model # [id number], serial # [id number])  . Severe aortic stenosis   . Sleep apnea    "lost 60# & don't have it anymore" (01/11/2014)  . Somnolence 08/04/2019  . UTI  (urinary tract infection) 08/04/2019  . Ventricular tachycardia [facility])     Past Surgical History:  Procedure Laterality Date  . CARDIAC CATHETERIZATION  08/20/2004   noncritical CAD,mild global hypokinesis, EF 50%  . CARDIAC DEFIBRILLATOR PLACEMENT  08/23/2004   Medtronic  . CATARACT EXTRACTION W/ INTRAOCULAR LENS IMPLANT Right   . COLECTOMY  1990's  . CRYOABLATION N/A 02/24/2014   Procedure: CRYO ABLATION PROSTATE;  Surgeon: Ailene Rud, MD;  Location: [facility] ORS;  Service: Urology;  Laterality: N/A;  . ERCP N/A 01/29/2019   Procedure: ENDOSCOPIC RETROGRADE CHOLANGIOPANCREATOGRAPHY (ERCP);  Surgeon: Milus Banister, MD;  Location: [facility] ENDOSCOPY;  Service: Endoscopy;  Laterality: N/A;  . HERNIA REPAIR     "abdomen; from colon OR"  . ICD LEAD REMOVAL N/A 09/15/2018   Procedure: ICD LEAD REMOVAL EXTRACTION;  Surgeon: Evans Lance, MD;  Location: [facility] OR;  Service: Cardiovascular;  Laterality: N/A;  DR. BARTLE TO BACK UP  . IMPLANTABLE CARDIOVERTER DEFIBRILLATOR (ICD) GENERATOR CHANGE N/A 01/11/2014   Procedure: ICD GENERATOR CHANGE;  Surgeon: Sanda Klein, MD;  Location: [place] CATH LAB;  Service: Cardiovascular;  Laterality: N/A;  . IR CATHETER TUBE CHANGE  03/01/2019  . IR CATHETER TUBE CHANGE  04/05/2019  . IR CATHETER TUBE CHANGE  07/05/2019  . IR CATHETER TUBE CHANGE  08/02/2019  . IR CATHETER TUBE CHANGE  08/30/2019  . IR CATHETER TUBE CHANGE  09/27/2019  . IR CATHETER TUBE CHANGE  10/25/2019  .  IR CATHETER TUBE CHANGE  11/29/2019  . IR EXCHANGE BILIARY DRAIN  12/18/2018  . IR EXCHANGE BILIARY DRAIN  01/05/2019  . IR EXCHANGE BILIARY DRAIN  01/25/2019  . IR EXCHANGE BILIARY DRAIN  01/13/2020  . IR EXCHANGE BILIARY DRAIN  02/28/2020  . IR EXCHANGE BILIARY DRAIN  04/24/2020  . IR EXCHANGE BILIARY DRAIN  07/03/2020  . IR EXCHANGE BILIARY DRAIN  09/14/2020  . IR EXCHANGE BILIARY DRAIN  11/27/2020  . IR PERC CHOLECYSTOSTOMY  11/22/2018  . IR RADIOLOGIST EVAL & MGMT  02/26/2019  . IR RADIOLOGIST  EVAL & MGMT  03/17/2019  . IR SINUS/FIST TUBE CHK-NON GI  01/28/2019  . LEAD REVISION N/A 01/11/2014   Procedure: LEAD REVISION;  Surgeon: Sanda Klein, MD;  Location: [place] CATH LAB;  Service: Cardiovascular;  Laterality: N/A;  . NM MYOCAR PERF WALL MOTION  01/29/2012   abnormal c/o infarct/scar,no ischemia present  . PROSTATE BIOPSY N/A 11/22/2013   Procedure: PROSTATE BIOPSY AND ULTRASOUND;  Surgeon: Ailene Rud, MD;  Location: [facility] ORS;  Service: Urology;  Laterality: N/A;  . REMOVAL OF STONES  01/29/2019   Procedure: REMOVAL OF STONES;  Surgeon: Milus Banister, MD;  Location: [facility] ENDOSCOPY;  Service: Endoscopy;;  . RIGHT/LEFT HEART CATH AND CORONARY ANGIOGRAPHY N/A 07/06/2018   Procedure: RIGHT/LEFT HEART CATH AND CORONARY ANGIOGRAPHY;  Surgeon: Troy Sine, MD;  Location: [place] CV LAB;  Service: Cardiovascular;  Laterality: N/A;  . SPHINCTEROTOMY  01/29/2019   Procedure: SPHINCTEROTOMY;  Surgeon: Milus Banister, MD;  Location: [facility] ENDOSCOPY;  Service: Endoscopy;;  . TEE WITHOUT CARDIOVERSION N/A 08/18/2018   Procedure: TRANSESOPHAGEAL ECHOCARDIOGRAM (TEE);  Surgeon: Burnell Blanks, MD;  Location: [place];  Service: Open Heart Surgery;  Laterality: N/A;  . TEE WITHOUT CARDIOVERSION N/A 09/08/2018   Procedure: TRANSESOPHAGEAL ECHOCARDIOGRAM (TEE);  Surgeon: Lelon Perla, MD;  Location: [facility] ENDOSCOPY;  Service: Cardiovascular;  Laterality: N/A;  . TEE WITHOUT CARDIOVERSION N/A 09/15/2018   Procedure: TRANSESOPHAGEAL ECHOCARDIOGRAM (TEE);  Surgeon: Evans Lance, MD;  Location: [facility] OR;  Service: Cardiovascular;  Laterality: N/A;  . TEE WITHOUT CARDIOVERSION N/A 11/26/2018   Procedure: TRANSESOPHAGEAL ECHOCARDIOGRAM (TEE);  Surgeon: Jerline Pain, MD;  Location: [facility] ENDOSCOPY;  Service: Cardiovascular;  Laterality: N/A;  . TRANSCATHETER AORTIC VALVE REPLACEMENT, TRANSFEMORAL  08/18/2018  . TRANSCATHETER AORTIC VALVE REPLACEMENT, TRANSFEMORAL N/A 08/18/2018   Procedure:  TRANSCATHETER AORTIC VALVE REPLACEMENT, TRANSFEMORAL using an Edwards 69mm Aortic Valve;  Surgeon: Burnell Blanks, MD;  Location: [place];  Service: Open Heart Surgery;  Laterality: N/A;     reports that he has never smoked. He has never used smokeless tobacco. He reports previous alcohol use. He reports that he does not use drugs.  Allergies  Allergen Reactions  . Crestor [Rosuvastatin] Other (See Comments)    Hurts muscles  . Lipitor [Atorvastatin] Other (See Comments)    Hurts stomach  . Shrimp [Shellfish Allergy] Other (See Comments)    On MAR    Family History  Problem Relation Age of Onset  . Heart failure Mother        Died of "old age" at 52  . Pneumonia Father     Prior to Admission medications   Medication Sig Start Date End Date Taking? Authorizing Provider  acetaminophen (TYLENOL) 500 MG tablet Take 500 mg by mouth every 6 (six) hours as needed (for pain).    Yes [provider]  amiodarone (PACERONE) 200 MG tablet TAKE 1 TABLET(200 MG) BY MOUTH  DAILY Patient taking differently: Take 200 mg by mouth daily. 10/02/20  Yes Croitoru, Mihai, MD  aspirin EC 81 MG tablet Take 81 mg by mouth every Monday, Wednesday, and Friday.   Yes [provider]  cholecalciferol (VITAMIN D) 1000 units tablet Take 2,000 Units by mouth at bedtime.   Yes [provider]  Cranberry-Vit C-Lactobacillus (RA CRANBERRY SUPPLEMENTS PO) Take by mouth in the morning and at bedtime.    Yes [provider]  loratadine (CLARITIN) 10 MG tablet Take 10 mg by mouth daily.   Yes [provider]  Multiple Vitamins-Minerals (PRESERVISION AREDS 2 PO) Take by mouth. Take one tablet by mouth dailey    Yes [provider]  potassium chloride (KLOR-CON) 10 MEQ tablet Take 1 tablet (10 mEq total) by mouth daily. 08/16/19  Yes Croitoru, Mihai, MD  Probiotic Product (PROBIOTIC DAILY PO) Take 1 capsule by mouth daily.   Yes [provider]  ursodiol  (ACTIGALL) 300 MG capsule TAKE 2 CAPSULES(600 MG) BY MOUTH TWICE DAILY Patient taking differently: Take 600 mg by mouth 2 (two) times daily. 07/17/20  Yes Milus Banister, MD  zinc gluconate 50 MG tablet Take 50 mg by mouth daily.   Yes [provider]  polyethylene glycol powder (GLYCOLAX/MIRALAX) 17 GM/SCOOP powder MIX 17 GRAMS IN 8 OUNCES OF WATER EVERY DAY. Patient not taking: No sig reported 03/24/19   Jessy Oto, MD    Physical Exam: Constitutional: Moderately built and nourished. Vitals:   11/30/20 2115 11/30/20 2145 11/30/20 2245 12/01/20 0018  BP: 123/84 126/84 112/82 103/76  Pulse: 96 93 (!) 50 62  Resp: (!) 32 (!) 23 (!) 31 20  Temp:    98.9 F (37.2 C)  TempSrc:    Axillary  SpO2: 96% 96% 96% 98%   Eyes: Anicteric no pallor. ENMT: No discharge from the ears nose or mouth. Neck: No mass felt.  No neck rigidity. Respiratory: No rhonchi or crepitations. Cardiovascular: S1-S2 heard. Abdomen: Soft nontender bowel sounds present. Musculoskeletal: No edema. Skin: No rash. Neurologic: Patient has advanced dementia. Psychiatric: Has advanced dementia.   Labs on Admission: I have personally reviewed following labs and imaging studies  CBC: Recent Labs  Lab 11/30/20 2005  WBC 7.7  NEUTROABS 5.0  HGB 15.0  HCT 42.9  MCV 95.1  PLT 578*   Basic Metabolic Panel: Recent Labs  Lab 11/30/20 2005  NA 132*  K 4.2  CL 98  CO2 23  GLUCOSE 136*  BUN 14  CREATININE 0.95  CALCIUM 8.4*   GFR: CrCl cannot be calculated (Unknown ideal weight.). Liver Function Tests: Recent Labs  Lab 11/30/20 2005  AST 17  ALT 13  ALKPHOS 117  BILITOT 1.4*  PROT 7.0  ALBUMIN 2.9*   No results for input(s): LIPASE, AMYLASE in the last 168 hours. No results for input(s): AMMONIA in the last 168 hours. Coagulation Profile: Recent Labs  Lab 11/30/20 2005  INR 1.0   Cardiac Enzymes: No results for input(s): CKTOTAL, CKMB, CKMBINDEX, TROPONINI in the last 168  hours. BNP (last 3 results) No results for input(s): PROBNP in the last 8760 hours. HbA1C: No results for input(s): HGBA1C in the last 72 hours. CBG: No results for input(s): GLUCAP in the last 168 hours. Lipid Profile: No results for input(s): CHOL, HDL, LDLCALC, TRIG, CHOLHDL, LDLDIRECT in the last 72 hours. Thyroid Function Tests: No results for input(s): TSH, T4TOTAL, FREET4, T3FREE, THYROIDAB in the last 72 hours. Anemia Panel: No results for  input(s): VITAMINB12, FOLATE, FERRITIN, TIBC, IRON, RETICCTPCT in the last 72 hours. Urine analysis:    Component Value Date/Time   COLORURINE RED (A) 11/30/2020 2005   APPEARANCEUR TURBID (A) 11/30/2020 2005   LABSPEC 1.015 11/30/2020 2005   PHURINE 6.0 11/30/2020 2005   GLUCOSEU NEGATIVE 11/30/2020 2005   HGBUR LARGE (A) 11/30/2020 2005   BILIRUBINUR NEGATIVE 11/30/2020 2005   BILIRUBINUR Negative 07/29/2019 [place] 11/30/2020 2005   PROTEINUR 100 (A) 11/30/2020 2005   UROBILINOGEN negative (A) 07/29/2019 0927   NITRITE NEGATIVE 11/30/2020 2005   LEUKOCYTESUR LARGE (A) 11/30/2020 2005   Sepsis Labs: @LABRCNTIP (procalcitonin:4,lacticidven:4) ) Recent Results (from the past 240 hour(s))  Resp Panel by RT-PCR (Flu A&B, Covid) Nasopharyngeal Swab     Status: Abnormal   Collection Time: 11/30/20  8:05 PM   Specimen: Nasopharyngeal Swab; Nasopharyngeal(NP) swabs in vial transport medium  Result Value Ref Range Status   SARS Coronavirus 2 by RT PCR POSITIVE (A) NEGATIVE Final    Comment: RESULT CALLED TO, READ BACK BY AND VERIFIED WITH: Loel Ro RN 11/30/20 2239 JDW (NOTE) SARS-CoV-2 target nucleic acids are DETECTED.  The SARS-CoV-2 RNA is generally detectable in upper respiratory specimens during the acute phase of infection. Positive results are indicative of the presence of the identified virus, but do not rule out bacterial infection or co-infection with other pathogens not detected by the test. Clinical  correlation with patient history and other diagnostic information is necessary to determine patient infection status. The expected result is Negative.  Fact Sheet for Patients: EntrepreneurPulse.com.au  Fact Sheet for Healthcare Providers: IncredibleEmployment.be  This test is not yet approved or cleared by the [place] FDA and  has been authorized for detection and/or diagnosis of SARS-CoV-2 by FDA under an Emergency Use Authorization (EUA).  This EUA will remain in effect (meaning this test can be u sed) for the duration of  the COVID-19 declaration under Section 564(b)(1) of the Act, 21 U.S.C. section 360bbb-3(b)(1), unless the authorization is terminated or revoked sooner.     Influenza A by PCR NEGATIVE NEGATIVE Final   Influenza B by PCR NEGATIVE NEGATIVE Final    Comment: (NOTE) The Xpert Xpress SARS-CoV-2/FLU/RSV plus assay is intended as an aid in the diagnosis of influenza from Nasopharyngeal swab specimens and should not be used as a sole basis for treatment. Nasal washings and aspirates are unacceptable for Xpert Xpress SARS-CoV-2/FLU/RSV testing.  Fact Sheet for Patients: EntrepreneurPulse.com.au  Fact Sheet for Healthcare Providers: IncredibleEmployment.be  This test is not yet approved or cleared by the [place] FDA and has been authorized for detection and/or diagnosis of SARS-CoV-2 by FDA under an Emergency Use Authorization (EUA). This EUA will remain in effect (meaning this test can be used) for the duration of the COVID-19 declaration under Section 564(b)(1) of the Act, 21 U.S.C. section 360bbb-3(b)(1), unless the authorization is terminated or revoked.  Performed at [facility], [place] [address], [place], [place] [address]      Radiological Exams on Admission: DG Chest Port 1 View  Result Date: 11/30/2020 CLINICAL DATA:  Questionable sepsis.  Blood in urine.  EXAM: PORTABLE CHEST 1 VIEW COMPARISON:  Most recent radiograph 10/13/2019 FINDINGS: Stable low lung volumes. Chronic cardiomegaly. Post TAVR. Aortic atherosclerosis and tortuosity. Streaky bibasilar opacities typically atelectasis. Elevation of left hemidiaphragm, increased. The patient's chin obscures the apices. No visualized pneumothorax or large pleural effusion. No pulmonary edema. IMPRESSION: 1. Low lung volumes with bibasilar opacities, typically atelectasis. 2.  Increased elevation of the left hemidiaphragm likely due to atelectasis. 3. Chronic cardiomegaly.  Post TAVR. Aortic Atherosclerosis (ICD10-I70.0). Electronically Signed   By: Keith Rake M.D.   On: 11/30/2020 20:23    EKG: Independently reviewed.  Normal sinus rhythm with LBBB.  Assessment/Plan Principal Problem:   Acute cystitis with hematuria Active Problems:   Nonischemic cardiomyopathy (HCC)   AAA (abdominal aortic aneurysm) (HCC)   S/P ICD (internal cardiac defibrillator) procedure   Prostate cancer (HCC)   Severe aortic stenosis   Dementia (HCC)   S/P TAVR (transcatheter aortic valve replacement)   UTI (urinary tract infection)   COVID-19    1. Hematuria with UA concerning for UTI started on meropenem with history of ESBL.  Follow cultures.  Was started on fluids.  We will hold off further fluids given history of nonischemic cardiomyopathy and normal lactate. 2. Covid infection -this is a second episode of patient having Covid infection.  Has had one episode in 2020 December.  Presently not hypoxic.  Chest x-ray showed nonspecific findings.  Patient started on remdesivir.  Closely monitor respiratory status inflammatory markers.   3. History of nonischemic cardiomyopathy and history of V. tach on amiodarone.  Not on any cardiac medication due to low normal blood pressures. 4. History of TAVR.  Follow blood cultures. 5. History of prostate cancer status post cryotherapy. 6. History of dementia. 7. History of  recurrent cholangitis with choledocholithiasis and gallstones status post cholecystostomy tube with recent drain exchange on February 14th 2022. 8. History of advanced dementia.   DVT prophylaxis: SCDs.  Avoiding anticoagulation due to hematuria. Code Status: DNR confirmed with the patient's family. Family Communication: Patient's daughter and patient's wife. Disposition Plan: Home when stable. Consults called: None. Admission status: Observation.   Rise Patience MD [facility] Pager [phone].  If 7PM-7AM, please contact night-coverage [email or web address] Password [facility]  12/01/2020, 2:19 AM

## 2020-12-01 NOTE — Progress Notes (Signed)
PROGRESS NOTE                                                                                                                                                                                                             Patient Demographics:    William [name], is a 87 y.o. male, DOB - 01/24/1928, ZOX:[id number]  Outpatient Primary MD for the patient is William Curry, DO   Admit date - 11/30/2020   LOS - 0  Chief Complaint  Patient presents with  . Hematuria       Brief Narrative: Patient is a 87 y.o. 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  COVID-19 vaccinated status: Vaccinated including booster  Significant Events: 2/17>> Admit to [facility] for hematuria-concern for complicated UTI  Significant studies: 2/17>>Chest x-ray: Bibasilar opacities-likely atelectasis  COVID-19 medications: Remdesivir: 2/17 >>  Antibiotics: Meropenem: 2/17>>  Microbiology data: 2/17 >>blood culture: Pending 2/17>> urine culture: Pending  Procedures: None  Consults: None  DVT prophylaxis: SCDs Start: 12/01/20 0217    Subjective:    William [name] today    Assessment  & Plan :   Hematuria-? complicated UTI: Poor historian (bedbound/nonverbal)-however no fever/leukocytosis-continue meropenem-await urine/blood culture.  At family's request-spoke with infectious disease MD-Dr. Manandhar-recommendations were to continue empiric meropenem-if no positive culture data in 48 hours-recommendations were to stop meropenem.  COVID-19 infection: Appears asymptomatic-no obvious respiratory  symptoms-Remdesivir x3 days.  Chronic systolic heart failure (EF 25/30 on TTE on 11/26/2018): Volume status stable-follow closely  History of ventricular tachycardia: On amiodarone-continue telemetry monitoring.  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  History of recurrent cholecystitis/cholangitis: Cholecystostomy tube in place-recent exchanged on 2/14.  Reviewed prior notes-not a surgical candidate given significant medical comorbidities and high risk status.  History of aortic stenosis-s/p TAVR  History of prostate cancer  History of dementia: Nonverbal at baseline-bedbound-and dependent on family for all activities of daily living.  Severe debility/deconditioning  ABG:    Component Value Date/Time   PHART 7.407 11/23/2018 0751   PCO2ART 32.6 11/23/2018 0751   PO2ART 166.0 (H)  11/23/2018 0751   HCO3 20.6 11/23/2018 0751   TCO2 22 11/23/2018 0751   ACIDBASEDEF 3.0 (H) 11/23/2018 0751   O2SAT 100.0 11/23/2018 0751    Vent Settings: N/A  Condition -  Guarded  Family Communication  :  Spouse at bedside  Code Status :  DNR  Diet :  Diet Order            Diet Heart Room service appropriate? Yes; Fluid consistency: Thin; Fluid restriction: 1200 mL Fluid  Diet effective now                  Disposition Plan  :   Status is: Observation  The patient will require care spanning > 2 midnights and should be moved to inpatient because: IV treatments appropriate due to intensity of illness or inability to take PO  Dispo: The patient is from: Home              Anticipated d/c is to: Home              Anticipated d/c date is: 2 days              Patient currently is not medically stable to d/c.   Difficult to place patient No  Barriers to discharge: UTI-COVID-19 infection-on IV antibiotics (history of multidrug-resistant organisms in the past) and IV Remdesivir  Antimicorbials  :    Anti-infectives (From admission, onward)   Start      Dose/Rate Route Frequency Ordered Stop   12/02/20 1000  remdesivir 100 mg in sodium chloride 0.9 % 100 mL IVPB       "Followed by" Linked Group Details   100 mg 200 mL/hr over 30 Minutes Intravenous Daily 12/01/20 0627 12/04/20 0959   12/01/20 1000  remdesivir 100 mg in sodium chloride 0.9 % 100 mL IVPB  Status:  Discontinued       "Followed by" Linked Group Details   100 mg 200 mL/hr over 30 Minutes Intravenous Daily 11/30/20 2256 12/01/20 0627   12/01/20 0800  ceFEPIme (MAXIPIME) 2 g in sodium chloride 0.9 % 100 mL IVPB  Status:  Discontinued        2 g 200 mL/hr over 30 Minutes Intravenous Every 12 hours 11/30/20 2055 12/01/20 0219   12/01/20 0600  meropenem (MERREM) 1 g in sodium chloride 0.9 % 100 mL IVPB        1 g 200 mL/hr over 30 Minutes Intravenous Every 8 hours 12/01/20 0248     12/01/20 0000  remdesivir 200 mg in sodium chloride 0.9% 250 mL IVPB       "Followed by" Linked Group Details   200 mg 580 mL/hr over 30 Minutes Intravenous Once 11/30/20 2256 12/01/20 0320   11/30/20 2015  ceFEPIme (MAXIPIME) 2 g in sodium chloride 0.9 % 100 mL IVPB        2 g 200 mL/hr over 30 Minutes Intravenous  Once 11/30/20 2006 11/30/20 2247      Inpatient Medications  Scheduled Meds: . amiodarone  200 mg Oral Daily  . loratadine  10 mg Oral Daily  . ursodiol  600 mg Oral BID   Continuous Infusions: . meropenem (MERREM) IV 1 g (12/01/20 0749)  . [START ON 12/02/2020] remdesivir 100 mg in NS 100 mL     PRN Meds:.acetaminophen **OR** acetaminophen   Time Spent in minutes  25  See all Orders from today for further details   Oren Binet M.D on 12/01/2020 at 12:01 PM  To page go to [email or web address] - use universal password  [facility] -  Office  [phone]    Objective:   Vitals:   12/01/20 0200 12/01/20 0400 12/01/20 0732 12/01/20 0740  BP:  98/75 107/79   Pulse:  86 90   Resp:  20 16   Temp:  98.7 F (37.1 C) 98.9 F (37.2 C)   TempSrc:  Oral Oral   SpO2:   95% (!) 81% 93%  Weight: 81.6 kg     Height: 5\' 10"  (1.778 m)       Wt Readings from Last 3 Encounters:  12/01/20 81.6 kg  07/21/19 83.9 kg  05/21/19 83.9 kg     Intake/Output Summary (Last 24 hours) at 12/01/2020 1201 Last data filed at 12/01/2020 0942 Gross per 24 hour  Intake 380 ml  Output 50 ml  Net 330 ml     Physical Exam Gen Exam:Alert awake-not in any distress.  Incredibly frail appearing HEENT:atraumatic, normocephalic Chest: B/L clear to auscultation anteriorly CVS:S1S2 regular Abdomen:soft non tender, non distended Extremities:no edema Neurology: Generalized weakness Skin: no rash   Data Review:    CBC Recent Labs  Lab 11/30/20 2005  WBC 7.7  HGB 15.0  HCT 42.9  PLT 149*  MCV 95.1  MCH 33.3  MCHC 35.0  RDW 15.8*  LYMPHSABS 1.4  MONOABS 0.6  EOSABS 0.6*  BASOSABS 0.1    Chemistries  Recent Labs  Lab 11/30/20 2005  NA 132*  K 4.2  CL 98  CO2 23  GLUCOSE 136*  BUN 14  CREATININE 0.95  CALCIUM 8.4*  AST 17  ALT 13  ALKPHOS 117  BILITOT 1.4*   ------------------------------------------------------------------------------------------------------------------ No results for input(s): CHOL, HDL, LDLCALC, TRIG, CHOLHDL, LDLDIRECT in the last 72 hours.  Lab Results  Component Value Date   HGBA1C 5.7 (H) 01/27/2019   ------------------------------------------------------------------------------------------------------------------ No results for input(s): TSH, T4TOTAL, T3FREE, THYROIDAB in the last 72 hours.  Invalid input(s): FREET3 ------------------------------------------------------------------------------------------------------------------ No results for input(s): VITAMINB12, FOLATE, FERRITIN, TIBC, IRON, RETICCTPCT in the last 72 hours.  Coagulation profile Recent Labs  Lab 11/30/20 2005  INR 1.0    No results for input(s): DDIMER in the last 72 hours.  Cardiac Enzymes No results for input(s): CKMB, TROPONINI, MYOGLOBIN  in the last 168 hours.  Invalid input(s): CK ------------------------------------------------------------------------------------------------------------------    Component Value Date/Time   BNP 332.5 (H) 11/21/2018 7628    Micro Results Recent Results (from the past 240 hour(s))  Resp Panel by RT-PCR (Flu A&B, Covid) Nasopharyngeal Swab     Status: Abnormal   Collection Time: 11/30/20  8:05 PM   Specimen: Nasopharyngeal Swab; Nasopharyngeal(NP) swabs in vial transport medium  Result Value Ref Range Status   SARS Coronavirus 2 by RT PCR POSITIVE (A) NEGATIVE Final    Comment: RESULT CALLED TO, READ BACK BY AND VERIFIED WITH: Loel Ro RN 11/30/20 2239 JDW (NOTE) SARS-CoV-2 target nucleic acids are DETECTED.  The SARS-CoV-2 RNA is generally detectable in upper respiratory specimens during the acute phase of infection. Positive results are indicative of the presence of the identified virus, but do not rule out bacterial infection or co-infection with other pathogens not detected by the test. Clinical correlation with patient history and other diagnostic information is necessary to determine patient infection status. The expected result is Negative.  Fact Sheet for Patients: EntrepreneurPulse.com.au  Fact Sheet for Healthcare Providers: IncredibleEmployment.be  This test is not yet approved or cleared by the [place] and  has been authorized  for detection and/or diagnosis of SARS-CoV-2 by FDA under an Emergency Use Authorization (EUA).  This EUA will remain in effect (meaning this test can be u sed) for the duration of  the COVID-19 declaration under Section 564(b)(1) of the Act, 21 U.S.C. section 360bbb-3(b)(1), unless the authorization is terminated or revoked sooner.     Influenza A by PCR NEGATIVE NEGATIVE Final   Influenza B by PCR NEGATIVE NEGATIVE Final    Comment: (NOTE) The Xpert Xpress SARS-CoV-2/FLU/RSV plus assay is  intended as an aid in the diagnosis of influenza from Nasopharyngeal swab specimens and should not be used as a sole basis for treatment. Nasal washings and aspirates are unacceptable for Xpert Xpress SARS-CoV-2/FLU/RSV testing.  Fact Sheet for Patients: EntrepreneurPulse.com.au  Fact Sheet for Healthcare Providers: IncredibleEmployment.be  This test is not yet approved or cleared by the [place] FDA and has been authorized for detection and/or diagnosis of SARS-CoV-2 by FDA under an Emergency Use Authorization (EUA). This EUA will remain in effect (meaning this test can be used) for the duration of the COVID-19 declaration under Section 564(b)(1) of the Act, 21 U.S.C. section 360bbb-3(b)(1), unless the authorization is terminated or revoked.  Performed at [facility], [place] [address] [place], [place] [address]     Radiology Reports DG Chest Sleepy Eye 1 View  Result Date: 11/30/2020 CLINICAL DATA:  Questionable sepsis.  Blood in urine. EXAM: PORTABLE CHEST 1 VIEW COMPARISON:  Most recent radiograph 10/13/2019 FINDINGS: Stable low lung volumes. Chronic cardiomegaly. Post TAVR. Aortic atherosclerosis and tortuosity. Streaky bibasilar opacities typically atelectasis. Elevation of left hemidiaphragm, increased. The patient's chin obscures the apices. No visualized pneumothorax or large pleural effusion. No pulmonary edema. IMPRESSION: 1. Low lung volumes with bibasilar opacities, typically atelectasis. 2. Increased elevation of the left hemidiaphragm likely due to atelectasis. 3. Chronic cardiomegaly.  Post TAVR. Aortic Atherosclerosis (ICD10-I70.0). Electronically Signed   By: Keith Rake M.D.   On: 11/30/2020 20:23   IR EXCHANGE BILIARY DRAIN  Result Date: 11/27/2020 INDICATION: 87 year old male with chronic cholecystitis and choledocholithiasis. He comes in for routine percutaneous cholecystostomy tube exchange. EXAM: Cholecystostomy tube  exchange MEDICATIONS: None. ANESTHESIA/SEDATION: None. FLUOROSCOPY TIME:  Fluoroscopy Time: 0 minutes 18 seconds (8 mGy). COMPLICATIONS: None immediate. PROCEDURE: Informed written consent was obtained from the patient after a thorough discussion of the procedural risks, benefits and alternatives. All questions were addressed. Maximal Sterile Barrier Technique was utilized including caps, mask, sterile gowns, sterile gloves, sterile drape, hand hygiene and skin antiseptic. A timeout was performed prior to the initiation of the procedure. I hand injection of contrast material confirms that the existing tube is well positioned in the gallbladder lumen. The cystic duct is obstructed. The tube was transected and removed over an Amplatz wire. A new 87 French cook all-purpose drainage catheter was advanced over the wire and formed in the gallbladder. The catheter was then connected to bag drainage and secured to the skin with two separate 0 Prolene suture. IMPRESSION: Successful routine exchange of percutaneous cholecystostomy tube. Electronically Signed   By: Jacqulynn Cadet M.D.   On: 11/27/2020 16:14

## 2020-12-02 DIAGNOSIS — I428 Other cardiomyopathies: Secondary | ICD-10-CM

## 2020-12-02 LAB — CBC WITH DIFFERENTIAL/PLATELET
Abs Immature Granulocytes: 0.02 10*3/uL (ref 0.00–0.07)
Basophils Absolute: 0 10*3/uL (ref 0.0–0.1)
Basophils Relative: 1 %
Eosinophils Absolute: 0.5 10*3/uL (ref 0.0–0.5)
Eosinophils Relative: 8 %
HCT: 39.1 % (ref 39.0–52.0)
Hemoglobin: 13.8 g/dL (ref 13.0–17.0)
Immature Granulocytes: 0 %
Lymphocytes Relative: 23 %
Lymphs Abs: 1.3 10*3/uL (ref 0.7–4.0)
MCH: 33.6 pg (ref 26.0–34.0)
MCHC: 35.3 g/dL (ref 30.0–36.0)
MCV: 95.1 fL (ref 80.0–100.0)
Monocytes Absolute: 0.6 10*3/uL (ref 0.1–1.0)
Monocytes Relative: 10 %
Neutro Abs: 3.4 10*3/uL (ref 1.7–7.7)
Neutrophils Relative %: 58 %
Platelets: 133 10*3/uL — ABNORMAL LOW (ref 150–400)
RBC: 4.11 MIL/uL — ABNORMAL LOW (ref 4.22–5.81)
RDW: 15.9 % — ABNORMAL HIGH (ref 11.5–15.5)
WBC: 5.8 10*3/uL (ref 4.0–10.5)
nRBC: 0 % (ref 0.0–0.2)

## 2020-12-02 LAB — COMPREHENSIVE METABOLIC PANEL
ALT: 11 U/L (ref 0–44)
AST: 14 U/L — ABNORMAL LOW (ref 15–41)
Albumin: 2.6 g/dL — ABNORMAL LOW (ref 3.5–5.0)
Alkaline Phosphatase: 103 U/L (ref 38–126)
Anion gap: 9 (ref 5–15)
BUN: 23 mg/dL (ref 8–23)
CO2: 24 mmol/L (ref 22–32)
Calcium: 8.6 mg/dL — ABNORMAL LOW (ref 8.9–10.3)
Chloride: 101 mmol/L (ref 98–111)
Creatinine, Ser: 1.27 mg/dL — ABNORMAL HIGH (ref 0.61–1.24)
GFR, Estimated: 53 mL/min — ABNORMAL LOW (ref >60)
Glucose, Bld: 137 mg/dL — ABNORMAL HIGH (ref 70–99)
Potassium: 3.8 mmol/L (ref 3.5–5.1)
Sodium: 134 mmol/L — ABNORMAL LOW (ref 135–145)
Total Bilirubin: 1.2 mg/dL (ref 0.3–1.2)
Total Protein: 6.3 g/dL — ABNORMAL LOW (ref 6.5–8.1)

## 2020-12-02 LAB — C-REACTIVE PROTEIN: CRP: 2.1 mg/dL — ABNORMAL HIGH (ref <1.0)

## 2020-12-02 LAB — D-DIMER, QUANTITATIVE: D-Dimer, Quant: 3.19 ug/mL-FEU — ABNORMAL HIGH (ref 0.00–0.50)

## 2020-12-02 MED ORDER — SODIUM CHLORIDE 0.9% FLUSH
10.0000 mL | Freq: Three times a day (TID) | INTRAVENOUS | Status: DC
  Administered 2020-12-02 – 2020-12-03 (×4): 10 mL

## 2020-12-02 MED ORDER — SODIUM CHLORIDE 0.9 % IV SOLN
1.0000 g | Freq: Two times a day (BID) | INTRAVENOUS | Status: DC
  Administered 2020-12-02 – 2020-12-03 (×2): 1 g via INTRAVENOUS
  Filled 2020-12-02 (×3): qty 1

## 2020-12-02 NOTE — Progress Notes (Signed)
PHARMACY NOTE:  ANTIMICROBIAL RENAL DOSAGE ADJUSTMENT  Current antimicrobial regimen includes a mismatch between antimicrobial dosage and estimated renal function.  As per policy approved by the Pharmacy & Therapeutics and Medical Executive Committees, the antimicrobial dosage will be adjusted accordingly.  Current antimicrobial dosage:  Meropenem 1g IV q8h  Indication: UTI w/ known history of ESBL and VRE  Renal Function: Estimated Creatinine Clearance: 39.1 mL/min (A) (by C-G formula based on SCr of 1.27 mg/dL (H)).       Antimicrobial dosage has been changed to:  Meropenem 1g IV q12h  Thank you for allowing pharmacy to be a part of this patient's care.  Wilson Singer, PharmD PGY1 Pharmacy Resident 12/02/2020 12:27 PM

## 2020-12-02 NOTE — Plan of Care (Signed)
  Problem: Education: Goal: Knowledge of risk factors and measures for prevention of condition will improve Outcome: Progressing   Problem: Coping: Goal: Psychosocial and spiritual needs will be supported Outcome: Progressing   Problem: Education: Goal: Knowledge of General Education information will improve Description: Including pain rating scale, medication(s)/side effects and non-pharmacologic comfort measures Outcome: Progressing   Problem: Health Behavior/Discharge Planning: Goal: Ability to manage health-related needs will improve Outcome: Progressing   Problem: Clinical Measurements: Goal: Ability to maintain clinical measurements within normal limits will improve Outcome: Progressing Goal: Will remain free from infection Outcome: Progressing Goal: Diagnostic test results will improve Outcome: Progressing Goal: Respiratory complications will improve Outcome: Progressing Goal: Cardiovascular complication will be avoided Outcome: Progressing   Problem: Activity: Goal: Risk for activity intolerance will decrease Outcome: Progressing   Problem: Coping: Goal: Level of anxiety will decrease Outcome: Progressing

## 2020-12-02 NOTE — Progress Notes (Signed)
PROGRESS NOTE                                                                                                                                                                                                             Patient Demographics:    William [name], is a 87 y.o. male, DOB - [birth year], TMA:[id number]  Outpatient Primary MD for the patient is Gayland Curry, DO   Admit date - 11/30/2020   LOS - 1  Chief Complaint  Patient presents with  . Hematuria       Brief Narrative: Patient is a 87 y.o. 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  COVID-19 vaccinated status: Vaccinated including booster  Significant Events: 2/17>> Admit to [facility] for hematuria-concern for complicated UTI  Significant studies: 2/17>>Chest x-ray: Bibasilar opacities-likely atelectasis  COVID-19 medications: Remdesivir: 2/17 >>  Antibiotics: Meropenem: 2/17>>  Microbiology data: 2/17 >>blood culture: No growth  2/17>> urine culture: 20,000 colonies of E. coli   Procedures: None  Consults: None  DVT prophylaxis: SCDs Start: 12/01/20 0217    Subjective:    William [name] today remains unchanged-he is nonverbal-opens his eyes-acknowledges my presence but does not do anything else.   Assessment  & Plan :   Hematuria-? complicated UTI: Poor historian (bedbound/nonverbal)-however no fever/leukocytosis-await final culture results-continue meropenem.    At family's request-spoke with infectious disease MD-Dr. Aura Dials 2/18-recommendations were to continue empiric meropenem-if no  positive culture data in 48 hours-recommendations were to stop meropenem.  COVID-19 infection: Appears asymptomatic-no obvious respiratory symptoms-Remdesivir x3 days.  Chronic systolic heart failure (EF 25/30 on TTE on 11/26/2018): Volume status stable-follow closely.  No longer on CHF medications due to soft blood pressure per prior notes.  History of ventricular tachycardia: On amiodarone-continue telemetry monitoring.  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  History of recurrent cholecystitis/cholangitis: Cholecystostomy tube in place-recent exchanged on 2/14.  Reviewed prior notes-not a surgical candidate given significant medical comorbidities and high risk status.  History of aortic stenosis-s/p TAVR  History of prostate cancer  History of dementia: Nonverbal at  baseline-bedbound-and dependent on family for all activities of daily living.  Severe debility/deconditioning  ABG:    Component Value Date/Time   PHART 7.407 11/23/2018 0751   PCO2ART 32.6 11/23/2018 0751   PO2ART 166.0 (H) 11/23/2018 0751   HCO3 20.6 11/23/2018 0751   TCO2 22 11/23/2018 0751   ACIDBASEDEF 3.0 (H) 11/23/2018 0751   O2SAT 100.0 11/23/2018 0751    Vent Settings: N/A  Condition -  Guarded  Family Communication  :  Spouse at bedside  Code Status :  DNR  Diet :  Diet Order            Diet Heart Room service appropriate? Yes; Fluid consistency: Thin; Fluid restriction: 1200 mL Fluid  Diet effective now                  Disposition Plan  :   Status is: Observation  The patient will require care spanning > 2 midnights and should be moved to inpatient because: IV treatments appropriate due to intensity of illness or inability to take PO  Dispo: The patient is from: Home              Anticipated d/c is to: Home              Anticipated d/c date is: 2 days              Patient currently is not medically stable to d/c.   Difficult to place patient No  Barriers  to discharge: UTI-COVID-19 infection-on IV antibiotics (history of multidrug-resistant organisms in the past) and IV Remdesivir  Antimicorbials  :    Anti-infectives (From admission, onward)   Start     Dose/Rate Route Frequency Ordered Stop   12/02/20 2200  meropenem (MERREM) 1 g in sodium chloride 0.9 % 100 mL IVPB        1 g 200 mL/hr over 30 Minutes Intravenous Every 12 hours 12/02/20 1226     12/02/20 1000  remdesivir 100 mg in sodium chloride 0.9 % 100 mL IVPB       "Followed by" Linked Group Details   100 mg 200 mL/hr over 30 Minutes Intravenous Daily 12/01/20 0627 12/04/20 0959   12/01/20 1000  remdesivir 100 mg in sodium chloride 0.9 % 100 mL IVPB  Status:  Discontinued       "Followed by" Linked Group Details   100 mg 200 mL/hr over 30 Minutes Intravenous Daily 11/30/20 2256 12/01/20 0627   12/01/20 0800  ceFEPIme (MAXIPIME) 2 g in sodium chloride 0.9 % 100 mL IVPB  Status:  Discontinued        2 g 200 mL/hr over 30 Minutes Intravenous Every 12 hours 11/30/20 2055 12/01/20 0219   12/01/20 0600  meropenem (MERREM) 1 g in sodium chloride 0.9 % 100 mL IVPB  Status:  Discontinued        1 g 200 mL/hr over 30 Minutes Intravenous Every 8 hours 12/01/20 0248 12/02/20 1226   12/01/20 0000  remdesivir 200 mg in sodium chloride 0.9% 250 mL IVPB       "Followed by" Linked Group Details   200 mg 580 mL/hr over 30 Minutes Intravenous Once 11/30/20 2256 12/01/20 0320   11/30/20 2015  ceFEPIme (MAXIPIME) 2 g in sodium chloride 0.9 % 100 mL IVPB        2 g 200 mL/hr over 30 Minutes Intravenous  Once 11/30/20 2006 11/30/20 2247      Inpatient Medications  Scheduled Meds: . amiodarone  200 mg Oral Daily  . loratadine  10 mg Oral Daily  . sodium chloride flush  10 mL Intracatheter Q8H  . ursodiol  600 mg Oral BID   Continuous Infusions: . meropenem (MERREM) IV    . remdesivir 100 mg in NS 100 mL 100 mg (12/02/20 0937)   PRN Meds:.acetaminophen **OR** acetaminophen   Time Spent  in minutes  25  See all Orders from today for further details   Oren Binet M.D on 12/02/2020 at 1:38 PM  To page go to [email or web address] - use universal password  [facility] -  Office  [phone]    Objective:   Vitals:   12/01/20 1651 12/01/20 2000 12/02/20 0316 12/02/20 0400  BP: 98/74 101/69 109/72 93/66  Pulse: 88 98 100 90  Resp: 18 20 (!) 25 20  Temp: 98.8 F (37.1 C) 98.7 F (37.1 C) 98.1 F (36.7 C) 98 F (36.7 C)  TempSrc: Axillary Oral Axillary Oral  SpO2: 93% 93% 93% 95%  Weight:      Height:        Wt Readings from Last 3 Encounters:  12/01/20 81.6 kg  07/21/19 83.9 kg  05/21/19 83.9 kg     Intake/Output Summary (Last 24 hours) at 12/02/2020 1338 Last data filed at 12/02/2020 0948 Gross per 24 hour  Intake 345 ml  Output 526 ml  Net -181 ml     Physical Exam Gen Exam: Not in any distress-acknowledges me-nonverbal. HEENT:atraumatic, normocephalic Chest: B/L clear to auscultation anteriorly CVS:S1S2 regular Abdomen:soft non tender, non distended Extremities:no edema Neurology: Has severe generalized weakness-bedbound at baseline  skin: no rash   Data Review:    CBC Recent Labs  Lab 11/30/20 2005 12/02/20 0324  WBC 7.7 5.8  HGB 15.0 13.8  HCT 42.9 39.1  PLT 149* 133*  MCV 95.1 95.1  MCH 33.3 33.6  MCHC 35.0 35.3  RDW 15.8* 15.9*  LYMPHSABS 1.4 1.3  MONOABS 0.6 0.6  EOSABS 0.6* 0.5  BASOSABS 0.1 0.0    Chemistries  Recent Labs  Lab 11/30/20 2005 12/02/20 0324  NA 132* 134*  K 4.2 3.8  CL 98 101  CO2 23 24  GLUCOSE 136* 137*  BUN 14 23  CREATININE 0.95 1.27*  CALCIUM 8.4* 8.6*  AST 17 14*  ALT 13 11  ALKPHOS 117 103  BILITOT 1.4* 1.2   ------------------------------------------------------------------------------------------------------------------ No results for input(s): CHOL, HDL, LDLCALC, TRIG, CHOLHDL, LDLDIRECT in the last 72 hours.  Lab Results  Component Value Date   HGBA1C 5.7 (H) 01/27/2019    ------------------------------------------------------------------------------------------------------------------ No results for input(s): TSH, T4TOTAL, T3FREE, THYROIDAB in the last 72 hours.  Invalid input(s): FREET3 ------------------------------------------------------------------------------------------------------------------ No results for input(s): VITAMINB12, FOLATE, FERRITIN, TIBC, IRON, RETICCTPCT in the last 72 hours.  Coagulation profile Recent Labs  Lab 11/30/20 2005  INR 1.0    Recent Labs    12/02/20 0324  DDIMER 3.19*    Cardiac Enzymes No results for input(s): CKMB, TROPONINI, MYOGLOBIN in the last 168 hours.  Invalid input(s): CK ------------------------------------------------------------------------------------------------------------------    Component Value Date/Time   BNP 332.5 (H) 11/21/2018 8099    Micro Results Recent Results (from the past 240 hour(s))  Urine culture     Status: Abnormal (Preliminary result)   Collection Time: 11/30/20 12:52 AM   Specimen: In/Out Cath Urine  Result Value Ref Range Status   Specimen Description IN/OUT CATH URINE  Final   Special Requests NONE  Final   Culture (A)  Final    20,000  COLONIES/mL ESCHERICHIA COLI SUSCEPTIBILITIES TO FOLLOW Performed at [facility] [address] [place], [place] [address]    Report Status PENDING  Incomplete  Resp Panel by RT-PCR (Flu A&B, Covid) Nasopharyngeal Swab     Status: Abnormal   Collection Time: 11/30/20  8:05 PM   Specimen: Nasopharyngeal Swab; Nasopharyngeal(NP) swabs in vial transport medium  Result Value Ref Range Status   SARS Coronavirus 2 by RT PCR POSITIVE (A) NEGATIVE Final    Comment: RESULT CALLED TO, READ BACK BY AND VERIFIED WITH: Loel Ro RN 11/30/20 2239 JDW (NOTE) SARS-CoV-2 target nucleic acids are DETECTED.  The SARS-CoV-2 RNA is generally detectable in upper respiratory specimens during the acute phase of infection. Positive  results are indicative of the presence of the identified virus, but do not rule out bacterial infection or co-infection with other pathogens not detected by the test. Clinical correlation with patient history and other diagnostic information is necessary to determine patient infection status. The expected result is Negative.  Fact Sheet for Patients: EntrepreneurPulse.com.au  Fact Sheet for Healthcare Providers: IncredibleEmployment.be  This test is not yet approved or cleared by the [place] FDA and  has been authorized for detection and/or diagnosis of SARS-CoV-2 by FDA under an Emergency Use Authorization (EUA).  This EUA will remain in effect (meaning this test can be u sed) for the duration of  the COVID-19 declaration under Section 564(b)(1) of the Act, 21 U.S.C. section 360bbb-3(b)(1), unless the authorization is terminated or revoked sooner.     Influenza A by PCR NEGATIVE NEGATIVE Final   Influenza B by PCR NEGATIVE NEGATIVE Final    Comment: (NOTE) The Xpert Xpress SARS-CoV-2/FLU/RSV plus assay is intended as an aid in the diagnosis of influenza from Nasopharyngeal swab specimens and should not be used as a sole basis for treatment. Nasal washings and aspirates are unacceptable for Xpert Xpress SARS-CoV-2/FLU/RSV testing.  Fact Sheet for Patients: EntrepreneurPulse.com.au  Fact Sheet for Healthcare Providers: IncredibleEmployment.be  This test is not yet approved or cleared by the [place] FDA and has been authorized for detection and/or diagnosis of SARS-CoV-2 by FDA under an Emergency Use Authorization (EUA). This EUA will remain in effect (meaning this test can be used) for the duration of the COVID-19 declaration under Section 564(b)(1) of the Act, 21 U.S.C. section 360bbb-3(b)(1), unless the authorization is terminated or revoked.  Performed at [facility], [place]  [address], [place], [place] [address]   Blood Culture (routine x 2)     Status: None (Preliminary result)   Collection Time: 11/30/20  8:10 PM   Specimen: BLOOD  Result Value Ref Range Status   Specimen Description BLOOD RIGHT ANTECUBITAL  Final   Special Requests   Final    BOTTLES DRAWN AEROBIC AND ANAEROBIC Blood Culture results may not be optimal due to an inadequate volume of blood received in culture bottles   Culture   Final    NO GROWTH 2 DAYS Performed at [facility], [place] [address], [place], [place] [address]    Report Status PENDING  Incomplete  Blood Culture (routine x 2)     Status: None (Preliminary result)   Collection Time: 11/30/20  9:10 PM   Specimen: BLOOD RIGHT HAND  Result Value Ref Range Status   Specimen Description BLOOD RIGHT HAND  Final   Special Requests   Final    BOTTLES DRAWN AEROBIC AND ANAEROBIC Blood Culture adequate volume   Culture   Final  NO GROWTH 2 DAYS Performed at [facility], [place] [address], [place], [place] [address]    Report Status PENDING  Incomplete    Radiology Reports DG Chest Port 1 View  Result Date: 11/30/2020 CLINICAL DATA:  Questionable sepsis.  Blood in urine. EXAM: PORTABLE CHEST 1 VIEW COMPARISON:  Most recent radiograph 10/13/2019 FINDINGS: Stable low lung volumes. Chronic cardiomegaly. Post TAVR. Aortic atherosclerosis and tortuosity. Streaky bibasilar opacities typically atelectasis. Elevation of left hemidiaphragm, increased. The patient's chin obscures the apices. No visualized pneumothorax or large pleural effusion. No pulmonary edema. IMPRESSION: 1. Low lung volumes with bibasilar opacities, typically atelectasis. 2. Increased elevation of the left hemidiaphragm likely due to atelectasis. 3. Chronic cardiomegaly.  Post TAVR. Aortic Atherosclerosis (ICD10-I70.0). Electronically Signed   By: Keith Rake M.D.   On: 11/30/2020 20:23   IR EXCHANGE BILIARY DRAIN  Result Date: 11/27/2020 INDICATION: 87 year old  male with chronic cholecystitis and choledocholithiasis. He comes in for routine percutaneous cholecystostomy tube exchange. EXAM: Cholecystostomy tube exchange MEDICATIONS: None. ANESTHESIA/SEDATION: None. FLUOROSCOPY TIME:  Fluoroscopy Time: 0 minutes 18 seconds (8 mGy). COMPLICATIONS: None immediate. PROCEDURE: Informed written consent was obtained from the patient after a thorough discussion of the procedural risks, benefits and alternatives. All questions were addressed. Maximal Sterile Barrier Technique was utilized including caps, mask, sterile gowns, sterile gloves, sterile drape, hand hygiene and skin antiseptic. A timeout was performed prior to the initiation of the procedure. I hand injection of contrast material confirms that the existing tube is well positioned in the gallbladder lumen. The cystic duct is obstructed. The tube was transected and removed over an Amplatz wire. A new 28 French cook all-purpose drainage catheter was advanced over the wire and formed in the gallbladder. The catheter was then connected to bag drainage and secured to the skin with two separate 0 Prolene suture. IMPRESSION: Successful routine exchange of percutaneous cholecystostomy tube. Electronically Signed   By: Jacqulynn Cadet M.D.   On: 11/27/2020 16:14

## 2020-12-03 LAB — URINE CULTURE: Culture: 20000 — AB

## 2020-12-03 MED ORDER — AMOXICILLIN 500 MG PO CAPS: 500.0000 mg | ORAL_CAPSULE | Freq: Three times a day (TID) | ORAL | 0 refills | Status: AC

## 2020-12-03 NOTE — Progress Notes (Addendum)
PATIENT DETAILS Name: William [name] Age: 87 y.o. Sex: male Date of Birth: 10-13-1928 MRN: [id number]. Admitting Physician: Jonetta Osgood, MD [id number], Rexene Edison, DO  Admit Date: 11/30/2020 Discharge date: 12/03/2020  Recommendations for Outpatient Follow-up:  1. Follow up with PCP in 1-2 weeks 2. Please obtain CMP/CBC in one week  Admitted From:  Home  Disposition: Home with home health [place]: Yes  Equipment/Devices: None  Discharge Condition: Stable  CODE STATUS:  DNR  Diet recommendation:  Diet Order            Diet - low sodium heart healthy           Diet Heart Room service appropriate? Yes; Fluid consistency: Thin; Fluid restriction: 1200 mL Fluid  Diet effective now                  Brief Summary: Patient is a 87 y.o. 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  COVID-19 vaccinated status: Vaccinated including booster  Significant Events: 2/17>> Admit to [facility] for hematuria-concern for complicated UTI  Significant studies: 2/17>>Chest x-ray: Bibasilar opacities-likely atelectasis  COVID-19 medications: Remdesivir: 2/17 >>2/20  Antibiotics: Meropenem: 2/17>>2/20 Amoxicillin:started on d/c  Microbiology data: 2/17 >>blood culture: No growth  2/17>> urine culture: E. coli   Procedures: None  Consults: None  Brief Hospital Course: Hematuria-? complicated UTI: Poor historian (bedbound/nonverbal)-however no fever/leukocytosis-given history of ESBL infection-initially placed on meropenem-urine culture showing pansensitive E. coli-we will switch to amoxicillin for a  few more days.  Discussed with spouse yesterday-ideally-he needs urological work-up-however given his frailty/bedbound state-not sure if there is any benefit to commencing a urological work-up.  I have asked her to discuss with patient's PCP.    COVID-19 infection: Appears asymptomatic-no obvious respiratory symptoms-Remdesivir x3 days.  Chronic systolic heart failure (EF 25/30 on TTE on 11/26/2018): Volume status stable-follow closely.  No longer on CHF medications due to soft blood pressure per prior notes.  History of ventricular tachycardia: On amiodarone-continue telemetry monitoring.  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  History of recurrent cholecystitis/cholangitis: Cholecystostomy tube in place-recent exchanged on 2/14.  Reviewed prior notes-not a surgical candidate given significant medical comorbidities and high risk status.  History of aortic stenosis-s/p TAVR  History of prostate cancer  History of dementia: Nonverbal at baseline-bedbound-and dependent on family for all activities of daily living.  Severe debility/deconditioning  Lab Results  Component Value Date   SARSCOV2NAA POSITIVE (A) 11/30/2020     Discharge Diagnoses:  Principal Problem:   Acute cystitis with hematuria Active Problems:   Nonischemic cardiomyopathy ([place])   AAA (abdominal aortic aneurysm) (HCC)   S/P ICD (internal cardiac defibrillator) procedure   Prostate cancer (HCC)   Severe aortic stenosis   Dementia (HCC)   S/P TAVR (transcatheter aortic valve replacement)   UTI (urinary tract infection)   COVID-19   Discharge Instructions:    Person Under Monitoring Name: William [name]  Location: [address] [place] [address]   Infection Prevention Recommendations for Individuals Confirmed to have, or Being Evaluated for, 2019 Novel Coronavirus (COVID-19) Infection Who Receive Care at Home  Individuals who are confirmed to have, or are  being evaluated for, COVID-19  should follow the prevention steps below until a healthcare provider or local or state health department says they can return to normal activities.  Stay home except to get medical care You should restrict activities outside your home, except for getting medical care. Do not go to work, school, or public areas, and do not use public transportation or taxis.  Call ahead before visiting your doctor Before your medical appointment, call the healthcare provider and tell them that you have, or are being evaluated for, COVID-19 infection. This will help the healthcare provider's office take steps to keep other people from getting infected. Ask your healthcare provider to call the local or state health department.  Monitor your symptoms Seek prompt medical attention if your illness is worsening (e.g., difficulty breathing). Before going to your medical appointment, call the healthcare provider and tell them that you have, or are being evaluated for, COVID-19 infection. Ask your healthcare provider to call the local or state health department.  Wear a facemask You should wear a facemask that covers your nose and mouth when you are in the same room with other people and when you visit a healthcare provider. People who live with or visit you should also wear a facemask while they are in the same room with you.  Separate yourself from other people in your home As much as possible, you should stay in a different room from other people in your home. Also, you should use a separate bathroom, if available.  Avoid sharing household items You should not share dishes, drinking glasses, cups, eating utensils, towels, bedding, or other items with other people in your home. After using these items, you should wash them thoroughly with soap and water.  Cover your coughs and sneezes Cover your mouth and nose with a tissue when you cough or sneeze, or you can cough or sneeze into  your sleeve. Throw used tissues in a lined trash can, and immediately wash your hands with soap and water for at least 20 seconds or use an alcohol-based hand rub.  Wash your [facility] your hands often and thoroughly with soap and water for at least 20 seconds. You can use an alcohol-based hand sanitizer if soap and water are not available and if your hands are not visibly dirty. Avoid touching your eyes, nose, and mouth with unwashed hands.   Prevention Steps for Caregivers and Household Members of Individuals Confirmed to have, or Being Evaluated for, COVID-19 Infection Being Cared for in the Home  If you live with, or provide care at home for, a person confirmed to have, or being evaluated for, COVID-19 infection please follow these guidelines to prevent infection:  Follow healthcare provider's instructions Make sure that you understand and can help the patient follow any healthcare provider instructions for all care.  Provide for the patient's basic needs You should help the patient with basic needs in the home and provide support for getting groceries, prescriptions, and other personal needs.  Monitor the patient's symptoms If they are getting sicker, call his or her medical provider and tell them that the patient has, or is being evaluated for, COVID-19 infection. This will help the healthcare provider's office take steps to keep other people from getting infected. Ask the healthcare provider to call the local or state health department.  Limit the number of people who have contact with the patient  If possible, have only one caregiver for the patient.  Other household members should stay in another home or place  of residence. If this is not possible, they should stay  in another room, or be separated from the patient as much as possible. Use a separate bathroom, if available.  Restrict visitors who do not have an essential need to be in the home.  Keep older adults, very  young children, and other sick people away from the patient Keep older adults, very young children, and those who have compromised immune systems or chronic health conditions away from the patient. This includes people with chronic heart, lung, or kidney conditions, diabetes, and cancer.  Ensure good ventilation Make sure that shared spaces in the home have good air flow, such as from an air conditioner or an opened window, weather permitting.  Wash your hands often  Wash your hands often and thoroughly with soap and water for at least 20 seconds. You can use an alcohol based hand sanitizer if soap and water are not available and if your hands are not visibly dirty.  Avoid touching your eyes, nose, and mouth with unwashed hands.  Use disposable paper towels to dry your hands. If not available, use dedicated cloth towels and replace them when they become wet.  Wear a facemask and gloves  Wear a disposable facemask at all times in the room and gloves when you touch or have contact with the patient's blood, body fluids, and/or secretions or excretions, such as sweat, saliva, sputum, nasal mucus, vomit, urine, or feces.  Ensure the mask fits over your nose and mouth tightly, and do not touch it during use.  Throw out disposable facemasks and gloves after using them. Do not reuse.  Wash your hands immediately after removing your facemask and gloves.  If your personal clothing becomes contaminated, carefully remove clothing and launder. Wash your hands after handling contaminated clothing.  Place all used disposable facemasks, gloves, and other waste in a lined container before disposing them with other household waste.  Remove gloves and wash your hands immediately after handling these items.  Do not share dishes, glasses, or other household items with the patient  Avoid sharing household items. You should not share dishes, drinking glasses, cups, eating utensils, towels, bedding, or other  items with a patient who is confirmed to have, or being evaluated for, COVID-19 infection.  After the person uses these items, you should wash them thoroughly with soap and water.  Wash laundry thoroughly  Immediately remove and wash clothes or bedding that have blood, body fluids, and/or secretions or excretions, such as sweat, saliva, sputum, nasal mucus, vomit, urine, or feces, on them.  Wear gloves when handling laundry from the patient.  Read and follow directions on labels of laundry or clothing items and detergent. In general, wash and dry with the warmest temperatures recommended on the label.  Clean all areas the individual has used often  Clean all touchable surfaces, such as counters, tabletops, doorknobs, bathroom fixtures, toilets, phones, keyboards, tablets, and bedside tables, every day. Also, clean any surfaces that may have blood, body fluids, and/or secretions or excretions on them.  Wear gloves when cleaning surfaces the patient has come in contact with.  Use a diluted bleach solution (e.g., dilute bleach with 1 part bleach and 10 parts water) or a household disinfectant with a label that says EPA-registered for coronaviruses. To make a bleach solution at home, add 1 tablespoon of bleach to 1 quart (4 cups) of water. For a larger supply, add  cup of bleach to 1 gallon (16 cups) of water.  Read  labels of cleaning products and follow recommendations provided on product labels. Labels contain instructions for safe and effective use of the cleaning product including precautions you should take when applying the product, such as wearing gloves or eye protection and making sure you have good ventilation during use of the product.  Remove gloves and wash hands immediately after cleaning.  Monitor yourself for signs and symptoms of illness Caregivers and household members are considered close contacts, should monitor their health, and will be asked to limit movement outside of  the home to the extent possible. Follow the monitoring steps for close contacts listed on the symptom monitoring form.   ? If you have additional questions, contact your local health department or call the epidemiologist on call at [phone] (available 24/7). ? This guidance is subject to change. For the most up-to-date guidance from CDC, please refer to their website: YouBlogs.pl    Activity:  As tolerated with Full fall precautions use walker/cane & assistance as needed Discharge Instructions    Call MD for:  difficulty breathing, headache or visual disturbances   Complete by: As directed    Call MD for:  temperature >100.4   Complete by: As directed    Diet - low sodium heart healthy   Complete by: As directed    Discharge instructions   Complete by: As directed    1.)  5-7 days of isolation from 2/17  2.)  Discussed with your primary care practitioner whether or not a urological work-up is indicated for transient blood in the urine.   Increase activity slowly   Complete by: As directed    No wound care   Complete by: As directed      Allergies as of 12/03/2020      Reactions   Crestor [rosuvastatin] Other (See Comments)   Hurts muscles   Lipitor [atorvastatin] Other (See Comments)   Hurts stomach   Shrimp [shellfish Allergy] Other (See Comments)   On MAR      Medication List    TAKE these medications   acetaminophen 500 MG tablet Commonly known as: TYLENOL Take 500 mg by mouth every 6 (six) hours as needed (for pain).   amiodarone 200 MG tablet Commonly known as: PACERONE TAKE 1 TABLET(200 MG) BY MOUTH DAILY What changed: See the new instructions.   amoxicillin 500 MG capsule Commonly known as: AMOXIL Take 1 capsule (500 mg total) by mouth 3 (three) times daily for 2 days.   aspirin EC 81 MG tablet Take 81 mg by mouth every Monday, Wednesday, and Friday.   cholecalciferol 1000 units  tablet Commonly known as: VITAMIN D Take 2,000 Units by mouth at bedtime.   loratadine 10 MG tablet Commonly known as: CLARITIN Take 10 mg by mouth daily.   polyethylene glycol powder 17 GM/SCOOP powder Commonly known as: GLYCOLAX/MIRALAX MIX 17 GRAMS IN 8 OUNCES OF WATER EVERY DAY.   potassium chloride 10 MEQ tablet Commonly known as: KLOR-CON Take 1 tablet (10 mEq total) by mouth daily.   PRESERVISION AREDS 2 PO Take by mouth. Take one tablet by mouth dailey   PROBIOTIC DAILY PO Take 1 capsule by mouth daily.   RA CRANBERRY SUPPLEMENTS PO Take by mouth in the morning and at bedtime.   ursodiol 300 MG capsule Commonly known as: ACTIGALL TAKE 2 CAPSULES(600 MG) BY MOUTH TWICE DAILY What changed: See the new instructions.   zinc gluconate 50 MG tablet Take 50 mg by mouth daily.  Follow-up Information    Reed, Tiffany L, DO. Schedule an appointment as soon as possible for a visit in 1 week(s).   Specialty: Geriatric Medicine Contact information: [place]. [place] [address] [phone]        Croitoru, Dani Gobble, MD Follow up in 1 month(s).   Specialty: Cardiology Contact information: [address] [place] [address] [phone]              Allergies  Allergen Reactions  . Crestor [Rosuvastatin] Other (See Comments)    Hurts muscles  . Lipitor [Atorvastatin] Other (See Comments)    Hurts stomach  . Shrimp [Shellfish Allergy] Other (See Comments)    On MAR      Other Procedures/Studies: DG Chest Port 1 View  Result Date: 11/30/2020 CLINICAL DATA:  Questionable sepsis.  Blood in urine. EXAM: PORTABLE CHEST 1 VIEW COMPARISON:  Most recent radiograph 10/13/2019 FINDINGS: Stable low lung volumes. Chronic cardiomegaly. Post TAVR. Aortic atherosclerosis and tortuosity. Streaky bibasilar opacities typically atelectasis. Elevation of left hemidiaphragm, increased. The patient's chin obscures the apices. No visualized  pneumothorax or large pleural effusion. No pulmonary edema. IMPRESSION: 1. Low lung volumes with bibasilar opacities, typically atelectasis. 2. Increased elevation of the left hemidiaphragm likely due to atelectasis. 3. Chronic cardiomegaly.  Post TAVR. Aortic Atherosclerosis (ICD10-I70.0). Electronically Signed   By: Keith Rake M.D.   On: 11/30/2020 20:23   IR EXCHANGE BILIARY DRAIN  Result Date: 11/27/2020 INDICATION: 87 year old male with chronic cholecystitis and choledocholithiasis. He comes in for routine percutaneous cholecystostomy tube exchange. EXAM: Cholecystostomy tube exchange MEDICATIONS: None. ANESTHESIA/SEDATION: None. FLUOROSCOPY TIME:  Fluoroscopy Time: 0 minutes 18 seconds (8 mGy). COMPLICATIONS: None immediate. PROCEDURE: Informed written consent was obtained from the patient after a thorough discussion of the procedural risks, benefits and alternatives. All questions were addressed. Maximal Sterile Barrier Technique was utilized including caps, mask, sterile gowns, sterile gloves, sterile drape, hand hygiene and skin antiseptic. A timeout was performed prior to the initiation of the procedure. I hand injection of contrast material confirms that the existing tube is well positioned in the gallbladder lumen. The cystic duct is obstructed. The tube was transected and removed over an Amplatz wire. A new 24 French cook all-purpose drainage catheter was advanced over the wire and formed in the gallbladder. The catheter was then connected to bag drainage and secured to the skin with two separate 0 Prolene suture. IMPRESSION: Successful routine exchange of percutaneous cholecystostomy tube. Electronically Signed   By: Jacqulynn Cadet M.D.   On: 11/27/2020 16:14     TODAY-DAY OF DISCHARGE:  Subjective:   Virl Axe today has no headache,no chest abdominal pain,no new weakness tingling or numbness, feels much better wants to go home today.   Objective:   Blood pressure 99/70,  pulse 90, temperature (!) 97.5 F (36.4 C), temperature source Axillary, resp. rate 20, height 5\' 10"  (1.778 m), weight 81.6 kg, SpO2 95 %.  Intake/Output Summary (Last 24 hours) at 12/03/2020 1330 Last data filed at 12/03/2020 1026 Gross per 24 hour  Intake 310 ml  Output 430 ml  Net -120 ml   Filed Weights   12/01/20 0200  Weight: 81.6 kg    Exam: Awake Alert, Oriented *3, No new F.N deficits, Normal affect Adams.AT,PERRAL Supple Neck,No JVD, No cervical lymphadenopathy appriciated.  Symmetrical Chest wall movement, Good air movement bilaterally, CTAB RRR,No Gallops,Rubs or new Murmurs, No Parasternal Heave +ve B.Sounds, Abd Soft, Non tender, No organomegaly appriciated, No rebound -guarding or  rigidity. No Cyanosis, Clubbing or edema, No new Rash or bruise   PERTINENT RADIOLOGIC STUDIES: DG Chest Port 1 View  Result Date: 11/30/2020 CLINICAL DATA:  Questionable sepsis.  Blood in urine. EXAM: PORTABLE CHEST 1 VIEW COMPARISON:  Most recent radiograph 10/13/2019 FINDINGS: Stable low lung volumes. Chronic cardiomegaly. Post TAVR. Aortic atherosclerosis and tortuosity. Streaky bibasilar opacities typically atelectasis. Elevation of left hemidiaphragm, increased. The patient's chin obscures the apices. No visualized pneumothorax or large pleural effusion. No pulmonary edema. IMPRESSION: 1. Low lung volumes with bibasilar opacities, typically atelectasis. 2. Increased elevation of the left hemidiaphragm likely due to atelectasis. 3. Chronic cardiomegaly.  Post TAVR. Aortic Atherosclerosis (ICD10-I70.0). Electronically Signed   By: Keith Rake M.D.   On: 11/30/2020 20:23   IR EXCHANGE BILIARY DRAIN  Result Date: 11/27/2020 INDICATION: 87 year old male with chronic cholecystitis and choledocholithiasis. He comes in for routine percutaneous cholecystostomy tube exchange. EXAM: Cholecystostomy tube exchange MEDICATIONS: None. ANESTHESIA/SEDATION: None. FLUOROSCOPY TIME:  Fluoroscopy Time: 0  minutes 18 seconds (8 mGy). COMPLICATIONS: None immediate. PROCEDURE: Informed written consent was obtained from the patient after a thorough discussion of the procedural risks, benefits and alternatives. All questions were addressed. Maximal Sterile Barrier Technique was utilized including caps, mask, sterile gowns, sterile gloves, sterile drape, hand hygiene and skin antiseptic. A timeout was performed prior to the initiation of the procedure. I hand injection of contrast material confirms that the existing tube is well positioned in the gallbladder lumen. The cystic duct is obstructed. The tube was transected and removed over an Amplatz wire. A new 39 French cook all-purpose drainage catheter was advanced over the wire and formed in the gallbladder. The catheter was then connected to bag drainage and secured to the skin with two separate 0 Prolene suture. IMPRESSION: Successful routine exchange of percutaneous cholecystostomy tube. Electronically Signed   By: Jacqulynn Cadet M.D.   On: 11/27/2020 16:14     PERTINENT LAB RESULTS: CBC: Recent Labs    11/30/20 2005 12/02/20 0324  WBC 7.7 5.8  HGB 15.0 13.8  HCT 42.9 39.1  PLT 149* 133*   CMET CMP     Component Value Date/Time   NA 134 (L) 12/02/2020 0324   NA 136 (A) 09/16/2019 0000   K 3.8 12/02/2020 0324   CL 101 12/02/2020 0324   CO2 24 12/02/2020 0324   GLUCOSE 137 (H) 12/02/2020 0324   BUN 23 12/02/2020 0324   BUN 44 (A) 09/16/2019 0000   CREATININE 1.27 (H) 12/02/2020 0324   CREATININE 1.12 (H) 02/05/2017 1103   CALCIUM 8.6 (L) 12/02/2020 0324   PROT 6.3 (L) 12/02/2020 0324   ALBUMIN 2.6 (L) 12/02/2020 0324   AST 14 (L) 12/02/2020 0324   ALT 11 12/02/2020 0324   ALKPHOS 103 12/02/2020 0324   BILITOT 1.2 12/02/2020 0324   GFRNONAA 53 (L) 12/02/2020 0324   GFRAA >60 10/13/2019 2140    GFR Estimated Creatinine Clearance: 39.1 mL/min (A) (by C-G formula based on SCr of 1.27 mg/dL (H)). No results for input(s): LIPASE,  AMYLASE in the last 72 hours. No results for input(s): CKTOTAL, CKMB, CKMBINDEX, TROPONINI in the last 72 hours. Invalid input(s): POCBNP Recent Labs    12/02/20 0324  DDIMER 3.19*   No results for input(s): HGBA1C in the last 72 hours. No results for input(s): CHOL, HDL, LDLCALC, TRIG, CHOLHDL, LDLDIRECT in the last 72 hours. No results for input(s): TSH, T4TOTAL, T3FREE, THYROIDAB in the last 72 hours.  Invalid input(s): FREET3 No results for input(s):  VITAMINB12, FOLATE, FERRITIN, TIBC, IRON, RETICCTPCT in the last 72 hours. Coags: Recent Labs    11/30/20 2005  INR 1.0   Microbiology: Recent Results (from the past 240 hour(s))  Urine culture     Status: Abnormal   Collection Time: 11/30/20 12:52 AM   Specimen: In/Out Cath Urine  Result Value Ref Range Status   Specimen Description IN/OUT CATH URINE  Final   Special Requests   Final    NONE Performed at [facility], [address], [place], [place] [address]    Culture 20,000 COLONIES/mL ESCHERICHIA COLI (A)  Final   Report Status 12/03/2020 FINAL  Final   Organism ID, Bacteria ESCHERICHIA COLI (A)  Final      Susceptibility   Escherichia coli - MIC*    AMPICILLIN <=2 SENSITIVE Sensitive     CEFAZOLIN <=4 SENSITIVE Sensitive     CEFEPIME <=0.12 SENSITIVE Sensitive     CEFTRIAXONE <=0.25 SENSITIVE Sensitive     CIPROFLOXACIN <=0.25 SENSITIVE Sensitive     GENTAMICIN <=1 SENSITIVE Sensitive     IMIPENEM <=0.25 SENSITIVE Sensitive     NITROFURANTOIN <=16 SENSITIVE Sensitive     TRIMETH/SULFA <=20 SENSITIVE Sensitive     AMPICILLIN/SULBACTAM <=2 SENSITIVE Sensitive     PIP/TAZO <=4 SENSITIVE Sensitive     * 20,000 COLONIES/mL ESCHERICHIA COLI  Resp Panel by RT-PCR (Flu A&B, Covid) Nasopharyngeal Swab     Status: Abnormal   Collection Time: 11/30/20  8:05 PM   Specimen: Nasopharyngeal Swab; Nasopharyngeal(NP) swabs in vial transport medium  Result Value Ref Range Status   SARS Coronavirus 2 by RT PCR POSITIVE  (A) NEGATIVE Final    Comment: RESULT CALLED TO, READ BACK BY AND VERIFIED WITH: Loel Ro RN 11/30/20 2239 JDW (NOTE) SARS-CoV-2 target nucleic acids are DETECTED.  The SARS-CoV-2 RNA is generally detectable in upper respiratory specimens during the acute phase of infection. Positive results are indicative of the presence of the identified virus, but do not rule out bacterial infection or co-infection with other pathogens not detected by the test. Clinical correlation with patient history and other diagnostic information is necessary to determine patient infection status. The expected result is Negative.  Fact Sheet for Patients: EntrepreneurPulse.com.au  Fact Sheet for Healthcare Providers: IncredibleEmployment.be  This test is not yet approved or cleared by the [place] FDA and  has been authorized for detection and/or diagnosis of SARS-CoV-2 by FDA under an Emergency Use Authorization (EUA).  This EUA will remain in effect (meaning this test can be u sed) for the duration of  the COVID-19 declaration under Section 564(b)(1) of the Act, 21 U.S.C. section 360bbb-3(b)(1), unless the authorization is terminated or revoked sooner.     Influenza A by PCR NEGATIVE NEGATIVE Final   Influenza B by PCR NEGATIVE NEGATIVE Final    Comment: (NOTE) The Xpert Xpress SARS-CoV-2/FLU/RSV plus assay is intended as an aid in the diagnosis of influenza from Nasopharyngeal swab specimens and should not be used as a sole basis for treatment. Nasal washings and aspirates are unacceptable for Xpert Xpress SARS-CoV-2/FLU/RSV testing.  Fact Sheet for Patients: EntrepreneurPulse.com.au  Fact Sheet for Healthcare Providers: IncredibleEmployment.be  This test is not yet approved or cleared by the [place] FDA and has been authorized for detection and/or diagnosis of SARS-CoV-2 by FDA under an Emergency Use  Authorization (EUA). This EUA will remain in effect (meaning this test can be used) for the duration of the COVID-19 declaration under Section 564(b)(1) of the Act,  21 U.S.C. section 360bbb-3(b)(1), unless the authorization is terminated or revoked.  Performed at [facility], [place] [address] [place], [place] [address]   Blood Culture (routine x 2)     Status: None (Preliminary result)   Collection Time: 11/30/20  8:10 PM   Specimen: BLOOD  Result Value Ref Range Status   Specimen Description BLOOD RIGHT ANTECUBITAL  Final   Special Requests   Final    BOTTLES DRAWN AEROBIC AND ANAEROBIC Blood Culture results may not be optimal due to an inadequate volume of blood received in culture bottles   Culture   Final    NO GROWTH 3 DAYS Performed at [facility], [place] [address] [place], [place] [address]    Report Status PENDING  Incomplete  Blood Culture (routine x 2)     Status: None (Preliminary result)   Collection Time: 11/30/20  9:10 PM   Specimen: BLOOD RIGHT HAND  Result Value Ref Range Status   Specimen Description BLOOD RIGHT HAND  Final   Special Requests   Final    BOTTLES DRAWN AEROBIC AND ANAEROBIC Blood Culture adequate volume   Culture   Final    NO GROWTH 3 DAYS Performed at [facility], [place] [address], [place], [place] [address]    Report Status PENDING  Incomplete    FURTHER DISCHARGE INSTRUCTIONS:  Get Medicines reviewed and adjusted: Please take all your medications with you for your next visit with your Primary MD  Laboratory/radiological data: Please request your Primary MD to go over all hospital tests and procedure/radiological results at the follow up, please ask your Primary MD to get all Hospital records sent to his/her office.  In some cases, they will be blood work, cultures and biopsy results pending at the time of your discharge. Please request that your primary care M.D. goes through all the records of your hospital data and  follows up on these results.  Also Note the following: If you experience worsening of your admission symptoms, develop shortness of breath, life threatening emergency, suicidal or homicidal thoughts you must seek medical attention immediately by calling 911 or calling your MD immediately  if symptoms less severe.  You must read complete instructions/literature along with all the possible adverse reactions/side effects for all the Medicines you take and that have been prescribed to you. Take any new Medicines after you have completely understood and accpet all the possible adverse reactions/side effects.   Do not drive when taking Pain medications or sleeping medications (Benzodaizepines)  Do not take more than prescribed Pain, Sleep and Anxiety Medications. It is not advisable to combine anxiety,sleep and pain medications without talking with your primary care practitioner  Special Instructions: If you have smoked or chewed Tobacco  in the last 2 yrs please stop smoking, stop any regular Alcohol  and or any Recreational drug use.  Wear Seat belts while driving.  Please note: You were cared for by a hospitalist during your hospital stay. Once you are discharged, your primary care physician will handle any further medical issues. Please note that NO REFILLS for any discharge medications will be authorized once you are discharged, as it is imperative that you return to your primary care physician (or establish a relationship with a primary care physician if you do not have one) for your post hospital discharge needs so that they can reassess your need for medications and monitor your lab values.  Total Time spent coordinating discharge including counseling, education  and face to face time equals 35 minutes.  Signed: Novella Abraha 12/03/2020 1:30 PM

## 2020-12-03 NOTE — TOC Transition Note (Signed)
Transition of Care [facility]) - CM/SW Discharge Note   Patient Details  Name: William [name] MRN: [id number] Date of Birth: [birth year]  [facility] CM/SW Contact:  Pollie Friar, RN Phone Number: 12/03/2020, 1:48 PM   Clinical Narrative:    Pt is discharging home with resumption of [facility] services through [facility]. Cory with [facility] aware of d/c and orders. Pt needs transport home via ambulance. CM verified address with spouse. PTAR arranged and d/c packet at the desk. Bedside RN updated.    Final next level of care: Home w Home Health Services Barriers to Discharge: No Barriers Identified   Patient Goals and CMS Choice   CMS Medicare.gov Compare Post Acute Care list provided to:: Patient Represenative (must comment) Choice offered to / list presented to : Spouse  Discharge Placement                       Discharge Plan and Services                                     Social Determinants of Health (SDOH) Interventions     Readmission Risk Interventions No flowsheet data found.

## 2020-12-04 ENCOUNTER — Encounter: Payer: Self-pay | Admitting: Internal Medicine

## 2020-12-04 ENCOUNTER — Telehealth: Payer: Self-pay | Admitting: *Deleted

## 2020-12-04 NOTE — Telephone Encounter (Signed)
Transition Care Management Follow-Up Telephone Call   Date discharged and where:12/03/2020 [facility]  How have you been since you were released from the hospital? Fine, patient is bedbound  Any patient concerns? No  Items Reviewed:   Meds: Yes  Allergies:Yes  Dietary Changes Reviewed:Yes  Functional Questionnaire:  Independent-I Dependent-D  ADLs:D   Dressing- D    Eating-D   Maintaining continence-D   Transferring-D Hoyer Lift   Transportation-D    Meal Prep-D   Managing Meds-D   Confirmed importance and Date/Time of follow-up visits scheduled:12/06/2020 MyChart Visit with Dinah due to patient being bedbound.    Confirmed with patient if condition worsens to call PCP or go to the Emergency Dept. Patient was given office number and encouraged to call back with questions or concerns: Yes

## 2020-12-05 LAB — CULTURE, BLOOD (ROUTINE X 2)
Culture: NO GROWTH
Culture: NO GROWTH
Special Requests: ADEQUATE

## 2020-12-05 NOTE — Discharge Summary (Signed)
PATIENT DETAILS Name: William [name] Age: 87 y.o. Sex: male Date of Birth: 15-Jun-1928 MRN: [id number]. Admitting Physician: Jonetta Osgood, MD UKG:URKY, Rexene Edison, DO  Admit Date: 11/30/2020 Discharge date: 12/03/2020  Recommendations for Outpatient Follow-up:  1. Follow up with PCP in 1-2 weeks 2. Please obtain CMP/CBC in one week  Admitted From:  Home  Disposition: Home with home health services   [place]: Yes  Equipment/Devices: None  Discharge Condition: Stable  CODE STATUS:  DNR  Diet recommendation:  Diet Order            Diet - low sodium heart healthy                  Brief Summary: Patient is a 87 y.o. 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  COVID-19 vaccinated status: Vaccinated including booster  Significant Events: 2/17>> Admit to [facility] for hematuria-concern for complicated UTI  Significant studies: 2/17>>Chest x-ray: Bibasilar opacities-likely atelectasis  COVID-19 medications: Remdesivir: 2/17 >>2/20  Antibiotics: Meropenem: 2/17>>2/20 Amoxicillin:started on d/c  Microbiology data: 2/17 >>blood culture: No growth  2/17>> urine culture: E. coli   Procedures: None  Consults: None  Brief Hospital Course: Hematuria-? complicated UTI: Poor historian (bedbound/nonverbal)-however no fever/leukocytosis-given history of ESBL infection-initially placed on meropenem-urine culture showing pansensitive E. coli-we will switch to amoxicillin for a few more days.  Discussed with spouse yesterday-ideally-he needs urological work-up-however given his frailty/bedbound state-not sure  if there is any benefit to commencing a urological work-up.  I have asked her to discuss with patient's PCP.    COVID-19 infection: Appears asymptomatic-no obvious respiratory symptoms-Remdesivir x3 days.  Chronic systolic heart failure (EF 25/30 on TTE on 11/26/2018): Volume status stable-follow closely.  No longer on CHF medications due to soft blood pressure per prior notes.  History of ventricular tachycardia: On amiodarone-continue telemetry monitoring.  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  History of recurrent cholecystitis/cholangitis: Cholecystostomy tube in place-recent exchanged on 2/14.  Reviewed prior notes-not a surgical candidate given significant medical comorbidities and high risk status.  History of aortic stenosis-s/p TAVR  History of prostate cancer  History of dementia: Nonverbal at baseline-bedbound-and dependent on family for all activities of daily living.  Severe debility/deconditioning  Lab Results  Component Value Date   SARSCOV2NAA POSITIVE (A) 11/30/2020     Discharge Diagnoses:  Principal Problem:   Acute cystitis with hematuria Active Problems:   Nonischemic cardiomyopathy ([place])   AAA (abdominal aortic aneurysm) (HCC)   S/P ICD (internal cardiac defibrillator) procedure   Prostate cancer (HCC)   Severe aortic stenosis   Dementia (HCC)   S/P TAVR (transcatheter aortic valve replacement)   UTI (urinary tract infection)   COVID-19   Discharge Instructions:    Person Under Monitoring Name: Sheppard Luckenbach Latin  Location: [address] [place] [address]   Infection Prevention Recommendations for Individuals Confirmed to have, or Being Evaluated for, 2019 Novel Coronavirus (COVID-19) Infection Who Receive Care at Home  Individuals who are confirmed to have, or are being evaluated for, COVID-19 should follow the prevention steps below until a healthcare provider or local or state health  department says they can return to normal activities.  Stay home except to  get medical care You should restrict activities outside your home, except for getting medical care. Do not go to work, school, or public areas, and do not use public transportation or taxis.  Call ahead before visiting your doctor Before your medical appointment, call the healthcare provider and tell them that you have, or are being evaluated for, COVID-19 infection. This will help the healthcare provider's office take steps to keep other people from getting infected. Ask your healthcare provider to call the local or state health department.  Monitor your symptoms Seek prompt medical attention if your illness is worsening (e.g., difficulty breathing). Before going to your medical appointment, call the healthcare provider and tell them that you have, or are being evaluated for, COVID-19 infection. Ask your healthcare provider to call the local or state health department.  Wear a facemask You should wear a facemask that covers your nose and mouth when you are in the same room with other people and when you visit a healthcare provider. People who live with or visit you should also wear a facemask while they are in the same room with you.  Separate yourself from other people in your home As much as possible, you should stay in a different room from other people in your home. Also, you should use a separate bathroom, if available.  Avoid sharing household items You should not share dishes, drinking glasses, cups, eating utensils, towels, bedding, or other items with other people in your home. After using these items, you should wash them thoroughly with soap and water.  Cover your coughs and sneezes Cover your mouth and nose with a tissue when you cough or sneeze, or you can cough or sneeze into your sleeve. Throw used tissues in a lined trash can, and immediately wash your hands with soap and water for at least 20  seconds or use an alcohol-based hand rub.  Wash your [facility] your hands often and thoroughly with soap and water for at least 20 seconds. You can use an alcohol-based hand sanitizer if soap and water are not available and if your hands are not visibly dirty. Avoid touching your eyes, nose, and mouth with unwashed hands.   Prevention Steps for Caregivers and Household Members of Individuals Confirmed to have, or Being Evaluated for, COVID-19 Infection Being Cared for in the Home  If you live with, or provide care at home for, a person confirmed to have, or being evaluated for, COVID-19 infection please follow these guidelines to prevent infection:  Follow healthcare provider's instructions Make sure that you understand and can help the patient follow any healthcare provider instructions for all care.  Provide for the patient's basic needs You should help the patient with basic needs in the home and provide support for getting groceries, prescriptions, and other personal needs.  Monitor the patient's symptoms If they are getting sicker, call his or her medical provider and tell them that the patient has, or is being evaluated for, COVID-19 infection. This will help the healthcare provider's office take steps to keep other people from getting infected. Ask the healthcare provider to call the local or state health department.  Limit the number of people who have contact with the patient  If possible, have only one caregiver for the patient.  Other household members should stay in another home or place of residence. If this is not possible, they should stay  in another room, or be separated from the patient as much as possible. Use a separate bathroom,  if available.  Restrict visitors who do not have an essential need to be in the home.  Keep older adults, very young children, and other sick people away from the patient Keep older adults, very young children, and those who have  compromised immune systems or chronic health conditions away from the patient. This includes people with chronic heart, lung, or kidney conditions, diabetes, and cancer.  Ensure good ventilation Make sure that shared spaces in the home have good air flow, such as from an air conditioner or an opened window, weather permitting.  Wash your hands often  Wash your hands often and thoroughly with soap and water for at least 20 seconds. You can use an alcohol based hand sanitizer if soap and water are not available and if your hands are not visibly dirty.  Avoid touching your eyes, nose, and mouth with unwashed hands.  Use disposable paper towels to dry your hands. If not available, use dedicated cloth towels and replace them when they become wet.  Wear a facemask and gloves  Wear a disposable facemask at all times in the room and gloves when you touch or have contact with the patient's blood, body fluids, and/or secretions or excretions, such as sweat, saliva, sputum, nasal mucus, vomit, urine, or feces.  Ensure the mask fits over your nose and mouth tightly, and do not touch it during use.  Throw out disposable facemasks and gloves after using them. Do not reuse.  Wash your hands immediately after removing your facemask and gloves.  If your personal clothing becomes contaminated, carefully remove clothing and launder. Wash your hands after handling contaminated clothing.  Place all used disposable facemasks, gloves, and other waste in a lined container before disposing them with other household waste.  Remove gloves and wash your hands immediately after handling these items.  Do not share dishes, glasses, or other household items with the patient  Avoid sharing household items. You should not share dishes, drinking glasses, cups, eating utensils, towels, bedding, or other items with a patient who is confirmed to have, or being evaluated for, COVID-19 infection.  After the person uses  these items, you should wash them thoroughly with soap and water.  Wash laundry thoroughly  Immediately remove and wash clothes or bedding that have blood, body fluids, and/or secretions or excretions, such as sweat, saliva, sputum, nasal mucus, vomit, urine, or feces, on them.  Wear gloves when handling laundry from the patient.  Read and follow directions on labels of laundry or clothing items and detergent. In general, wash and dry with the warmest temperatures recommended on the label.  Clean all areas the individual has used often  Clean all touchable surfaces, such as counters, tabletops, doorknobs, bathroom fixtures, toilets, phones, keyboards, tablets, and bedside tables, every day. Also, clean any surfaces that may have blood, body fluids, and/or secretions or excretions on them.  Wear gloves when cleaning surfaces the patient has come in contact with.  Use a diluted bleach solution (e.g., dilute bleach with 1 part bleach and 10 parts water) or a household disinfectant with a label that says EPA-registered for coronaviruses. To make a bleach solution at home, add 1 tablespoon of bleach to 1 quart (4 cups) of water. For a larger supply, add  cup of bleach to 1 gallon (16 cups) of water.  Read labels of cleaning products and follow recommendations provided on product labels. Labels contain instructions for safe and effective use of the cleaning product including precautions you should take  when applying the product, such as wearing gloves or eye protection and making sure you have good ventilation during use of the product.  Remove gloves and wash hands immediately after cleaning.  Monitor yourself for signs and symptoms of illness Caregivers and household members are considered close contacts, should monitor their health, and will be asked to limit movement outside of the home to the extent possible. Follow the monitoring steps for close contacts listed on the symptom monitoring  form.   ? If you have additional questions, contact your local health department or call the epidemiologist on call at [phone] (available 24/7). ? This guidance is subject to change. For the most up-to-date guidance from CDC, please refer to their website: YouBlogs.pl    Activity:  As tolerated with Full fall precautions use walker/cane & assistance as needed Discharge Instructions    Call MD for:  difficulty breathing, headache or visual disturbances   Complete by: As directed    Call MD for:  temperature >100.4   Complete by: As directed    Diet - low sodium heart healthy   Complete by: As directed    Discharge instructions   Complete by: As directed    1.)  5-7 days of isolation from 2/17  2.)  Discussed with your primary care practitioner whether or not a urological work-up is indicated for transient blood in the urine.   Increase activity slowly   Complete by: As directed    No wound care   Complete by: As directed      Allergies as of 12/03/2020      Reactions   Crestor [rosuvastatin] Other (See Comments)   Hurts muscles   Lipitor [atorvastatin] Other (See Comments)   Hurts stomach   Shrimp [shellfish Allergy] Other (See Comments)   On MAR      Medication List    TAKE these medications   acetaminophen 500 MG tablet Commonly known as: TYLENOL Take 500 mg by mouth every 6 (six) hours as needed (for pain).   amiodarone 200 MG tablet Commonly known as: PACERONE TAKE 1 TABLET(200 MG) BY MOUTH DAILY What changed: See the new instructions.   amoxicillin 500 MG capsule Commonly known as: AMOXIL Take 1 capsule (500 mg total) by mouth 3 (three) times daily for 2 days.   aspirin EC 81 MG tablet Take 81 mg by mouth every Monday, Wednesday, and Friday.   cholecalciferol 1000 units tablet Commonly known as: VITAMIN D Take 2,000 Units by mouth at bedtime.   loratadine 10 MG tablet Commonly known  as: CLARITIN Take 10 mg by mouth daily.   polyethylene glycol powder 17 GM/SCOOP powder Commonly known as: GLYCOLAX/MIRALAX MIX 17 GRAMS IN 8 OUNCES OF WATER EVERY DAY.   potassium chloride 10 MEQ tablet Commonly known as: KLOR-CON Take 1 tablet (10 mEq total) by mouth daily.   PRESERVISION AREDS 2 PO Take by mouth. Take one tablet by mouth dailey   PROBIOTIC DAILY PO Take 1 capsule by mouth daily.   RA CRANBERRY SUPPLEMENTS PO Take by mouth in the morning and at bedtime.   ursodiol 300 MG capsule Commonly known as: ACTIGALL TAKE 2 CAPSULES(600 MG) BY MOUTH TWICE DAILY What changed: See the new instructions.   zinc gluconate 50 MG tablet Take 50 mg by mouth daily.       Follow-up Information    Reed, Tiffany L, DO. Schedule an appointment as soon as possible for a visit in 1 week(s).   Specialty: Geriatric Medicine Contact  information: [place]. [place] [address] [phone]        Croitoru, Dani Gobble, MD Follow up in 1 month(s).   Specialty: Cardiology Contact information: [address] [place] [address] [phone]              Allergies  Allergen Reactions  . Crestor [Rosuvastatin] Other (See Comments)    Hurts muscles  . Lipitor [Atorvastatin] Other (See Comments)    Hurts stomach  . Shrimp [Shellfish Allergy] Other (See Comments)    On MAR      Other Procedures/Studies: DG Chest Port 1 View  Result Date: 11/30/2020 CLINICAL DATA:  Questionable sepsis.  Blood in urine. EXAM: PORTABLE CHEST 1 VIEW COMPARISON:  Most recent radiograph 10/13/2019 FINDINGS: Stable low lung volumes. Chronic cardiomegaly. Post TAVR. Aortic atherosclerosis and tortuosity. Streaky bibasilar opacities typically atelectasis. Elevation of left hemidiaphragm, increased. The patient's chin obscures the apices. No visualized pneumothorax or large pleural effusion. No pulmonary edema. IMPRESSION: 1. Low lung volumes with bibasilar opacities, typically  atelectasis. 2. Increased elevation of the left hemidiaphragm likely due to atelectasis. 3. Chronic cardiomegaly.  Post TAVR. Aortic Atherosclerosis (ICD10-I70.0). Electronically Signed   By: Keith Rake M.D.   On: 11/30/2020 20:23   IR EXCHANGE BILIARY DRAIN  Result Date: 11/27/2020 INDICATION: 87 year old male with chronic cholecystitis and choledocholithiasis. He comes in for routine percutaneous cholecystostomy tube exchange. EXAM: Cholecystostomy tube exchange MEDICATIONS: None. ANESTHESIA/SEDATION: None. FLUOROSCOPY TIME:  Fluoroscopy Time: 0 minutes 18 seconds (8 mGy). COMPLICATIONS: None immediate. PROCEDURE: Informed written consent was obtained from the patient after a thorough discussion of the procedural risks, benefits and alternatives. All questions were addressed. Maximal Sterile Barrier Technique was utilized including caps, mask, sterile gowns, sterile gloves, sterile drape, hand hygiene and skin antiseptic. A timeout was performed prior to the initiation of the procedure. I hand injection of contrast material confirms that the existing tube is well positioned in the gallbladder lumen. The cystic duct is obstructed. The tube was transected and removed over an Amplatz wire. A new 74 French cook all-purpose drainage catheter was advanced over the wire and formed in the gallbladder. The catheter was then connected to bag drainage and secured to the skin with two separate 0 Prolene suture. IMPRESSION: Successful routine exchange of percutaneous cholecystostomy tube. Electronically Signed   By: Jacqulynn Cadet M.D.   On: 11/27/2020 16:14     TODAY-DAY OF DISCHARGE:  Subjective:   Virl Axe today has no headache,no chest abdominal pain,no new weakness tingling or numbness, feels much better wants to go home today.   Objective:   Blood pressure 99/70, pulse 90, temperature (!) 97.5 F (36.4 C), temperature source Axillary, resp. rate 20, height 5\' 10"  (1.778 m), weight 81.6  kg, SpO2 95 %. No intake or output data in the 24 hours ending 12/05/20 1553 Filed Weights   12/01/20 0200  Weight: 81.6 kg    Exam: Awake Alert, Oriented *3, No new F.N deficits, Normal affect [place].AT,PERRAL Supple Neck,No JVD, No cervical lymphadenopathy appriciated.  Symmetrical Chest wall movement, Good air movement bilaterally, CTAB RRR,No Gallops,Rubs or new Murmurs, No Parasternal Heave +ve B.Sounds, Abd Soft, Non tender, No organomegaly appriciated, No rebound -guarding or rigidity. No Cyanosis, Clubbing or edema, No new Rash or bruise   PERTINENT RADIOLOGIC STUDIES: DG Chest Port 1 View  Result Date: 11/30/2020 CLINICAL DATA:  Questionable sepsis.  Blood in urine. EXAM: PORTABLE CHEST 1 VIEW COMPARISON:  Most recent radiograph 10/13/2019 FINDINGS: Stable low lung  volumes. Chronic cardiomegaly. Post TAVR. Aortic atherosclerosis and tortuosity. Streaky bibasilar opacities typically atelectasis. Elevation of left hemidiaphragm, increased. The patient's chin obscures the apices. No visualized pneumothorax or large pleural effusion. No pulmonary edema. IMPRESSION: 1. Low lung volumes with bibasilar opacities, typically atelectasis. 2. Increased elevation of the left hemidiaphragm likely due to atelectasis. 3. Chronic cardiomegaly.  Post TAVR. Aortic Atherosclerosis (ICD10-I70.0). Electronically Signed   By: Keith Rake M.D.   On: 11/30/2020 20:23   IR EXCHANGE BILIARY DRAIN  Result Date: 11/27/2020 INDICATION: 87 year old male with chronic cholecystitis and choledocholithiasis. He comes in for routine percutaneous cholecystostomy tube exchange. EXAM: Cholecystostomy tube exchange MEDICATIONS: None. ANESTHESIA/SEDATION: None. FLUOROSCOPY TIME:  Fluoroscopy Time: 0 minutes 18 seconds (8 mGy). COMPLICATIONS: None immediate. PROCEDURE: Informed written consent was obtained from the patient after a thorough discussion of the procedural risks, benefits and alternatives. All questions were  addressed. Maximal Sterile Barrier Technique was utilized including caps, mask, sterile gowns, sterile gloves, sterile drape, hand hygiene and skin antiseptic. A timeout was performed prior to the initiation of the procedure. I hand injection of contrast material confirms that the existing tube is well positioned in the gallbladder lumen. The cystic duct is obstructed. The tube was transected and removed over an Amplatz wire. A new 8 French cook all-purpose drainage catheter was advanced over the wire and formed in the gallbladder. The catheter was then connected to bag drainage and secured to the skin with two separate 0 Prolene suture. IMPRESSION: Successful routine exchange of percutaneous cholecystostomy tube. Electronically Signed   By: Jacqulynn Cadet M.D.   On: 11/27/2020 16:14     PERTINENT LAB RESULTS: CBC: No results for input(s): WBC, HGB, HCT, PLT in the last 72 hours. CMET CMP     Component Value Date/Time   NA 134 (L) 12/02/2020 0324   NA 136 (A) 09/16/2019 0000   K 3.8 12/02/2020 0324   CL 101 12/02/2020 0324   CO2 24 12/02/2020 0324   GLUCOSE 137 (H) 12/02/2020 0324   BUN 23 12/02/2020 0324   BUN 44 (A) 09/16/2019 0000   CREATININE 1.27 (H) 12/02/2020 0324   CREATININE 1.12 (H) 02/05/2017 1103   CALCIUM 8.6 (L) 12/02/2020 0324   PROT 6.3 (L) 12/02/2020 0324   ALBUMIN 2.6 (L) 12/02/2020 0324   AST 14 (L) 12/02/2020 0324   ALT 11 12/02/2020 0324   ALKPHOS 103 12/02/2020 0324   BILITOT 1.2 12/02/2020 0324   GFRNONAA 53 (L) 12/02/2020 0324   GFRAA >60 10/13/2019 2140    GFR Estimated Creatinine Clearance: 39.1 mL/min (A) (by C-G formula based on SCr of 1.27 mg/dL (H)). No results for input(s): LIPASE, AMYLASE in the last 72 hours. No results for input(s): CKTOTAL, CKMB, CKMBINDEX, TROPONINI in the last 72 hours. Invalid input(s): POCBNP No results for input(s): DDIMER in the last 72 hours. No results for input(s): HGBA1C in the last 72 hours. No results for  input(s): CHOL, HDL, LDLCALC, TRIG, CHOLHDL, LDLDIRECT in the last 72 hours. No results for input(s): TSH, T4TOTAL, T3FREE, THYROIDAB in the last 72 hours.  Invalid input(s): FREET3 No results for input(s): VITAMINB12, FOLATE, FERRITIN, TIBC, IRON, RETICCTPCT in the last 72 hours. Coags: No results for input(s): INR in the last 72 hours.  Invalid input(s): PT Microbiology: Recent Results (from the past 240 hour(s))  Urine culture     Status: Abnormal   Collection Time: 11/30/20 12:52 AM   Specimen: In/Out Cath Urine  Result Value Ref Range Status   Specimen  Description IN/OUT CATH URINE  Final   Special Requests   Final    NONE Performed at [facility], [place] [address], [place], [place] [address]    Culture 20,000 COLONIES/mL ESCHERICHIA COLI (A)  Final   Report Status 12/03/2020 FINAL  Final   Organism ID, Bacteria ESCHERICHIA COLI (A)  Final      Susceptibility   Escherichia coli - MIC*    AMPICILLIN <=2 SENSITIVE Sensitive     CEFAZOLIN <=4 SENSITIVE Sensitive     CEFEPIME <=0.12 SENSITIVE Sensitive     CEFTRIAXONE <=0.25 SENSITIVE Sensitive     CIPROFLOXACIN <=0.25 SENSITIVE Sensitive     GENTAMICIN <=1 SENSITIVE Sensitive     IMIPENEM <=0.25 SENSITIVE Sensitive     NITROFURANTOIN <=16 SENSITIVE Sensitive     TRIMETH/SULFA <=20 SENSITIVE Sensitive     AMPICILLIN/SULBACTAM <=2 SENSITIVE Sensitive     PIP/TAZO <=4 SENSITIVE Sensitive     * 20,000 COLONIES/mL ESCHERICHIA COLI  Resp Panel by RT-PCR (Flu A&B, Covid) Nasopharyngeal Swab     Status: Abnormal   Collection Time: 11/30/20  8:05 PM   Specimen: Nasopharyngeal Swab; Nasopharyngeal(NP) swabs in vial transport medium  Result Value Ref Range Status   SARS Coronavirus 2 by RT PCR POSITIVE (A) NEGATIVE Final    Comment: RESULT CALLED TO, READ BACK BY AND VERIFIED WITH: Loel Ro RN 11/30/20 2239 JDW (NOTE) SARS-CoV-2 target nucleic acids are DETECTED.  The SARS-CoV-2 RNA is generally detectable in upper  respiratory specimens during the acute phase of infection. Positive results are indicative of the presence of the identified virus, but do not rule out bacterial infection or co-infection with other pathogens not detected by the test. Clinical correlation with patient history and other diagnostic information is necessary to determine patient infection status. The expected result is Negative.  Fact Sheet for Patients: EntrepreneurPulse.com.au  Fact Sheet for Healthcare Providers: IncredibleEmployment.be  This test is not yet approved or cleared by the [place] FDA and  has been authorized for detection and/or diagnosis of SARS-CoV-2 by FDA under an Emergency Use Authorization (EUA).  This EUA will remain in effect (meaning this test can be u sed) for the duration of  the COVID-19 declaration under Section 564(b)(1) of the Act, 21 U.S.C. section 360bbb-3(b)(1), unless the authorization is terminated or revoked sooner.     Influenza A by PCR NEGATIVE NEGATIVE Final   Influenza B by PCR NEGATIVE NEGATIVE Final    Comment: (NOTE) The Xpert Xpress SARS-CoV-2/FLU/RSV plus assay is intended as an aid in the diagnosis of influenza from Nasopharyngeal swab specimens and should not be used as a sole basis for treatment. Nasal washings and aspirates are unacceptable for Xpert Xpress SARS-CoV-2/FLU/RSV testing.  Fact Sheet for Patients: EntrepreneurPulse.com.au  Fact Sheet for Healthcare Providers: IncredibleEmployment.be  This test is not yet approved or cleared by the [place] FDA and has been authorized for detection and/or diagnosis of SARS-CoV-2 by FDA under an Emergency Use Authorization (EUA). This EUA will remain in effect (meaning this test can be used) for the duration of the COVID-19 declaration under Section 564(b)(1) of the Act, 21 U.S.C. section 360bbb-3(b)(1), unless the authorization is  terminated or revoked.  Performed at [facility], [place] [address] [place],  [address]   Blood Culture (routine x 2)     Status: None   Collection Time: 11/30/20  8:10 PM   Specimen: BLOOD  Result Value Ref Range Status   Specimen Description BLOOD RIGHT ANTECUBITAL  Final   Special Requests   Final    BOTTLES DRAWN AEROBIC AND ANAEROBIC Blood Culture results may not be optimal due to an inadequate volume of blood received in culture bottles   Culture   Final    NO GROWTH 5 DAYS Performed at [facility], [place] [address], [place], [place] [address]    Report Status 12/05/2020 FINAL  Final  Blood Culture (routine x 2)     Status: None   Collection Time: 11/30/20  9:10 PM   Specimen: BLOOD RIGHT HAND  Result Value Ref Range Status   Specimen Description BLOOD RIGHT HAND  Final   Special Requests   Final    BOTTLES DRAWN AEROBIC AND ANAEROBIC Blood Culture adequate volume   Culture   Final    NO GROWTH 5 DAYS Performed at [facility], [place] [address] [place], [place] [address]    Report Status 12/05/2020 FINAL  Final    FURTHER DISCHARGE INSTRUCTIONS:  Get Medicines reviewed and adjusted: Please take all your medications with you for your next visit with your Primary MD  Laboratory/radiological data: Please request your Primary MD to go over all hospital tests and procedure/radiological results at the follow up, please ask your Primary MD to get all Hospital records sent to his/her office.  In some cases, they will be blood work, cultures and biopsy results pending at the time of your discharge. Please request that your primary care M.D. goes through all the records of your hospital data and follows up on these results.  Also Note the following: If you experience worsening of your admission symptoms, develop shortness of breath, life threatening emergency, suicidal or homicidal thoughts you must seek medical attention immediately by calling 911 or  calling your MD immediately  if symptoms less severe.  You must read complete instructions/literature along with all the possible adverse reactions/side effects for all the Medicines you take and that have been prescribed to you. Take any new Medicines after you have completely understood and accpet all the possible adverse reactions/side effects.   Do not drive when taking Pain medications or sleeping medications (Benzodaizepines)  Do not take more than prescribed Pain, Sleep and Anxiety Medications. It is not advisable to combine anxiety,sleep and pain medications without talking with your primary care practitioner  Special Instructions: If you have smoked or chewed Tobacco  in the last 2 yrs please stop smoking, stop any regular Alcohol  and or any Recreational drug use.  Wear Seat belts while driving.  Please note: You were cared for by a hospitalist during your hospital stay. Once you are discharged, your primary care physician will handle any further medical issues. Please note that NO REFILLS for any discharge medications will be authorized once you are discharged, as it is imperative that you return to your primary care physician (or establish a relationship with a primary care physician if you do not have one) for your post hospital discharge needs so that they can reassess your need for medications and monitor your lab values.  Total Time spent coordinating discharge including counseling, education and face to face time equals 35 minutes.  SignedOren Binet 12/05/2020 3:53 PM

## 2020-12-06 ENCOUNTER — Telehealth: Payer: Self-pay

## 2020-12-06 ENCOUNTER — Encounter: Payer: Self-pay | Admitting: Family

## 2020-12-06 ENCOUNTER — Other Ambulatory Visit: Payer: Self-pay

## 2020-12-06 ENCOUNTER — Ambulatory Visit (INDEPENDENT_AMBULATORY_CARE_PROVIDER_SITE_OTHER): Payer: Medicare Other | Admitting: Family

## 2020-12-06 DIAGNOSIS — F0150 Vascular dementia without behavioral disturbance: Secondary | ICD-10-CM

## 2020-12-06 DIAGNOSIS — E871 Hypo-osmolality and hyponatremia: Secondary | ICD-10-CM | POA: Diagnosis not present

## 2020-12-06 DIAGNOSIS — D696 Thrombocytopenia, unspecified: Secondary | ICD-10-CM | POA: Diagnosis not present

## 2020-12-06 DIAGNOSIS — N3001 Acute cystitis with hematuria: Secondary | ICD-10-CM | POA: Diagnosis not present

## 2020-12-06 NOTE — Telephone Encounter (Signed)
[name], William [name] are scheduled for a virtual visit with your provider today.    Just as we do with appointments in the office, we must obtain your consent to participate.  Your consent will be active for this visit and any virtual visit you may have with one of our providers in the next 365 days.    If you have a MyChart account, I can also send a copy of this consent to you electronically.  All virtual visits are billed to your insurance company just like a traditional visit in the office.  As this is a virtual visit, video technology does not allow for your provider to perform a traditional examination.  This may limit your provider's ability to fully assess your condition.  If your provider identifies any concerns that need to be evaluated in person or the need to arrange testing such as labs, EKG, etc, we will make arrangements to do so.    Although advances in technology are sophisticated, we cannot ensure that it will always work on either your end or our end.  If the connection with a video visit is poor, we may have to switch to a telephone visit.  With either a video or telephone visit, we are not always able to ensure that we have a secure connection.   I need to obtain your verbal consent now.   Are you willing to proceed with your visit today?   William [name] has provided verbal consent on 12/06/2020 for a virtual visit (video or telephone).   William [name], [place] 12/06/2020  3:15 PM

## 2020-12-06 NOTE — Patient Instructions (Signed)
Please call and notify provider if current Home health Nurse is able to draw lab work.will need CBC/diff and CMP drawn.  - continue to encourage Fluids

## 2020-12-06 NOTE — Progress Notes (Signed)
This service is provided via telemedicine  No vital signs collected/recorded due to the encounter was a telemedicine visit.   Location of patient (ex: home, work): Home.  Patient consents to a telephone visit: Yes.  Location of the provider (ex: office, home): [facility].  Name of any referring provider: Gayland Curry, DO   Names of all persons participating in the telemedicine service and their role in the encounter: Patient, Patient wife Delane Ginger, Heriberto Antigua, [place], Jiali Linney, [place], NP.    Time spent on call: 8 minutes spent on the phone with Medical Assistant.     Provider: Trayvion Embleton FNP-C  Gayland Curry, DO  Patient Care Team: Gayland Curry, DO as PCP - General (Geriatric Medicine) Sanda Klein, MD as PCP - Cardiology (Cardiology) Carolan Clines, MD (Inactive) as Consulting Physician (Urology)  Extended Emergency Contact Information Primary Emergency Contact: [facility] Address: [address]          [place], [place] [address] [name] of [place] Phone: [phone] Mobile Phone: [phone] Relation: Spouse Secondary Emergency Contact: Michail Sermon, [place] of [place] Phone: [phone] Mobile Phone: [phone] Relation: Daughter  Code Status:  DNR Goals of care: Advanced Directive information Advanced Directives 12/06/2020  Does Patient Have a Medical Advance Directive? Yes  Type of Advance Directive Out of facility DNR (pink MOST or yellow form)  Does patient want to make changes to medical advance directive? No - Patient declined  Copy of [place] in Chart? -  Would patient like information on creating a medical advance directive? -  Pre-existing out of facility DNR order (yellow form or pink MOST form) -     Chief Complaint  Patient presents with  . Transitions Of Care    Hospitalization Follow Up 11/30/2020-12/03/2020    HPI:  Pt is a 87  y.o. male seen today for an acute visit for transition of care post hospitalization from 11/30/2020 - 12/03/2020 for Hematuria.He is seen in bed via video with assistance from wife and daughter.His urine culture showed 20,000 colonies of E.Coli.He was treated for complicated UTI with I.V meropenem.Blood culture showed no growth.condition improved and was back to his baseline.out patient follow up with Urologist was recommended.wife declines for now since patient is bed bound.He was discharged on Amoxicillin. Patient's wife states he is non-verbal.Has been doing well since he ws discharged home.Has a Holiday representative and HHN unclear ? From [facility].  He gets out of bed daily to chair with assistance. Has had no fever,chills or cough.Appetite is good. Hospital labs reviewed Na+ 321,Plts 149 no leukocytosis.discussed with wife and daughter need to repeat CBC/diff and CMP family will find out weather [facility] Nurse can draw blood work then send to lab.     Past Medical History:  Diagnosis Date  . AAA (abdominal aortic aneurysm) ([place])    7/13 3.8cm  . Arthritis    "joints" (01/11/2014)  . Asthma    "seasonal; some foods"   . Chronic systolic CHF (congestive heart failure) ([place])   . COVID-19 12/15/2019  . Dementia ([place])   . HLD (hyperlipidemia)   . HOH (hard of hearing)   . Lumbar vertebral fracture (HCC)    L1- 04/11/2014   . Parotitis 06/10/2019  . Prostate cancer ([place])   . S/P ICD (internal cardiac defibrillator) procedure, 01/11/14 removal of ERI gen and placement of Medtronic Evera XT VR & NEW  Right ventricular lead Medtronic 01/12/2014  . S/P TAVR (transcatheter aortic valve replacement)    Edwards Sapien 3 THV (size 29 mm, model # [id number], serial # [id number])  . Severe aortic stenosis   . Sleep apnea    "lost 60# & don't have it anymore" (01/11/2014)  . Somnolence 08/04/2019  . UTI (urinary tract infection) 08/04/2019  . Ventricular tachycardia [facility])    Past Surgical History:   Procedure Laterality Date  . CARDIAC CATHETERIZATION  08/20/2004   noncritical CAD,mild global hypokinesis, EF 50%  . CARDIAC DEFIBRILLATOR PLACEMENT  08/23/2004   Medtronic  . CATARACT EXTRACTION W/ INTRAOCULAR LENS IMPLANT Right   . COLECTOMY  1990's  . CRYOABLATION N/A 02/24/2014   Procedure: CRYO ABLATION PROSTATE;  Surgeon: Ailene Rud, MD;  Location: [facility] ORS;  Service: Urology;  Laterality: N/A;  . ERCP N/A 01/29/2019   Procedure: ENDOSCOPIC RETROGRADE CHOLANGIOPANCREATOGRAPHY (ERCP);  Surgeon: Milus Banister, MD;  Location: [facility] ENDOSCOPY;  Service: Endoscopy;  Laterality: N/A;  . HERNIA REPAIR     "abdomen; from colon OR"  . ICD LEAD REMOVAL N/A 09/15/2018   Procedure: ICD LEAD REMOVAL EXTRACTION;  Surgeon: Evans Lance, MD;  Location: [facility] OR;  Service: Cardiovascular;  Laterality: N/A;  DR. BARTLE TO BACK UP  . IMPLANTABLE CARDIOVERTER DEFIBRILLATOR (ICD) GENERATOR CHANGE N/A 01/11/2014   Procedure: ICD GENERATOR CHANGE;  Surgeon: Sanda Klein, MD;  Location: [place] [facility] CATH LAB;  Service: Cardiovascular;  Laterality: N/A;  . IR CATHETER TUBE CHANGE  03/01/2019  . IR CATHETER TUBE CHANGE  04/05/2019  . IR CATHETER TUBE CHANGE  07/05/2019  . IR CATHETER TUBE CHANGE  08/02/2019  . IR CATHETER TUBE CHANGE  08/30/2019  . IR CATHETER TUBE CHANGE  09/27/2019  . IR CATHETER TUBE CHANGE  10/25/2019  . IR CATHETER TUBE CHANGE  11/29/2019  . IR EXCHANGE BILIARY DRAIN  12/18/2018  . IR EXCHANGE BILIARY DRAIN  01/05/2019  . IR EXCHANGE BILIARY DRAIN  01/25/2019  . IR EXCHANGE BILIARY DRAIN  01/13/2020  . IR EXCHANGE BILIARY DRAIN  02/28/2020  . IR EXCHANGE BILIARY DRAIN  04/24/2020  . IR EXCHANGE BILIARY DRAIN  07/03/2020  . IR EXCHANGE BILIARY DRAIN  09/14/2020  . IR EXCHANGE BILIARY DRAIN  11/27/2020  . IR PERC CHOLECYSTOSTOMY  11/22/2018  . IR RADIOLOGIST EVAL & MGMT  02/26/2019  . IR RADIOLOGIST EVAL & MGMT  03/17/2019  . IR SINUS/FIST TUBE CHK-NON GI  01/28/2019  . LEAD REVISION N/A 01/11/2014    Procedure: LEAD REVISION;  Surgeon: Sanda Klein, MD;  Location: [place] CATH LAB;  Service: Cardiovascular;  Laterality: N/A;  . NM MYOCAR PERF WALL MOTION  01/29/2012   abnormal c/o infarct/scar,no ischemia present  . PROSTATE BIOPSY N/A 11/22/2013   Procedure: PROSTATE BIOPSY AND ULTRASOUND;  Surgeon: Ailene Rud, MD;  Location: [facility]S;  Service: Urology;  Laterality: N/A;  . REMOVAL OF STONES  01/29/2019   Procedure: REMOVAL OF STONES;  Surgeon: Milus Banister, MD;  Location: [facility] ENDOSCOPY;  Service: Endoscopy;;  . RIGHT/LEFT HEART CATH AND CORONARY ANGIOGRAPHY N/A 07/06/2018   Procedure: RIGHT/LEFT HEART CATH AND CORONARY ANGIOGRAPHY;  Surgeon: Troy Sine, MD;  Location: [place] CV LAB;  Service: Cardiovascular;  Laterality: N/A;  . SPHINCTEROTOMY  01/29/2019   Procedure: SPHINCTEROTOMY;  Surgeon: Milus Banister, MD;  Location: [facility] ENDOSCOPY;  Service: Endoscopy;;  . TEE WITHOUT CARDIOVERSION N/A 08/18/2018   Procedure: TRANSESOPHAGEAL ECHOCARDIOGRAM (TEE);  Surgeon: Burnell Blanks, MD;  Location:  [place] [facility] OR;  Service: Open Heart Surgery;  Laterality: N/A;  . TEE WITHOUT CARDIOVERSION N/A 09/08/2018   Procedure: TRANSESOPHAGEAL ECHOCARDIOGRAM (TEE);  Surgeon: Lelon Perla, MD;  Location: [facility] ENDOSCOPY;  Service: Cardiovascular;  Laterality: N/A;  . TEE WITHOUT CARDIOVERSION N/A 09/15/2018   Procedure: TRANSESOPHAGEAL ECHOCARDIOGRAM (TEE);  Surgeon: Evans Lance, MD;  Location: [facility] OR;  Service: Cardiovascular;  Laterality: N/A;  . TEE WITHOUT CARDIOVERSION N/A 11/26/2018   Procedure: TRANSESOPHAGEAL ECHOCARDIOGRAM (TEE);  Surgeon: Jerline Pain, MD;  Location: [facility] ENDOSCOPY;  Service: Cardiovascular;  Laterality: N/A;  . TRANSCATHETER AORTIC VALVE REPLACEMENT, TRANSFEMORAL  08/18/2018  . TRANSCATHETER AORTIC VALVE REPLACEMENT, TRANSFEMORAL N/A 08/18/2018   Procedure: TRANSCATHETER AORTIC VALVE REPLACEMENT, TRANSFEMORAL using an Edwards 54mm Aortic Valve;  Surgeon: Burnell Blanks, MD;  Location: [facility];  Service: Open Heart Surgery;  Laterality: N/A;    Allergies  Allergen Reactions  . Crestor [Rosuvastatin] Other (See Comments)    Hurts muscles  . Lipitor [Atorvastatin] Other (See Comments)    Hurts stomach  . Shrimp [Shellfish Allergy] Other (See Comments)    On MAR    Outpatient Encounter Medications as of 12/06/2020  Medication Sig  . acetaminophen (TYLENOL) 500 MG tablet Take 500 mg by mouth every 6 (six) hours as needed (for pain).   Marland Kitchen amiodarone (PACERONE) 200 MG tablet TAKE 1 TABLET(200 MG) BY MOUTH DAILY  . aspirin EC 81 MG tablet Take 81 mg by mouth every Monday, Wednesday, and Friday.  . cholecalciferol (VITAMIN D) 1000 units tablet Take 2,000 Units by mouth at bedtime.  . Cranberry-Vit C-Lactobacillus (RA CRANBERRY SUPPLEMENTS PO) Take by mouth in the morning and at bedtime.   Marland Kitchen loratadine (CLARITIN) 10 MG tablet Take 10 mg by mouth daily.  . Multiple Vitamins-Minerals (PRESERVISION AREDS 2 PO) Take by mouth. Take one tablet by mouth dailey   . polyethylene glycol (MIRALAX) 17 g packet Take 17 g by mouth as needed.  . potassium chloride (KLOR-CON) 10 MEQ tablet Take 1 tablet (10 mEq total) by mouth daily.  . Probiotic Product (PROBIOTIC DAILY PO) Take 1 capsule by mouth daily.  . ursodiol (ACTIGALL) 300 MG capsule TAKE 2 CAPSULES(600 MG) BY MOUTH TWICE DAILY  . zinc gluconate 50 MG tablet Take 50 mg by mouth daily.  . [DISCONTINUED] polyethylene glycol powder (GLYCOLAX/MIRALAX) 17 GM/SCOOP powder MIX 17 GRAMS IN 8 OUNCES OF WATER EVERY DAY. (Patient not taking: No sig reported)   No facility-administered encounter medications on file as of 12/06/2020.    Review of Systems  Unable to perform ROS: Dementia (information provided by patient's wife and daughter )  Constitutional: Negative for appetite change, chills, fatigue and fever.  HENT: Negative for congestion, rhinorrhea, sinus pressure, sinus pain and sneezing.   Respiratory:  Negative for cough, chest tightness, shortness of breath and wheezing.   Cardiovascular: Negative for chest pain, palpitations and leg swelling.  Gastrointestinal: Negative for abdominal distention, abdominal pain, constipation, diarrhea, nausea and vomiting.  Genitourinary: Negative for difficulty urinating, dysuria, flank pain, frequency and urgency.       Status post hospitalization for Hematuria   Musculoskeletal: Positive for gait problem. Negative for joint swelling and myalgias.  Skin: Negative for color change, pallor, rash and wound.  Neurological: Negative for dizziness, light-headedness and headaches.       Bed bound  Non-verbal per wife   Psychiatric/Behavioral: Positive for confusion. Negative for agitation, behavioral problems and sleep disturbance.    Immunization History  Administered Date(s) Administered  .  Influenza, High Dose Seasonal PF 08/13/2017, 07/03/2018  . Influenza,inj,Quad PF,6+ Mos 08/18/2013, 07/23/2016  . Janssen (J&J) SARS-COV-2 Vaccination 06/09/2020  . Pneumococcal Conjugate-13 08/14/2013  . Pneumococcal Polysaccharide-23 04/06/2009   Pertinent  Health Maintenance Due  Topic Date Due  . INFLUENZA VACCINE  Completed  . PNA vac Low Risk Adult  Completed   Fall Risk  12/06/2020 07/06/2020 04/04/2020 08/30/2019 08/09/2019  Falls in the past year? 0 0 0 0 0  Number falls in past yr: 0 - - - -  Injury with Fall? 0 - - 0 -  Risk for fall due to : - Impaired balance/gait;Impaired mobility Impaired mobility - Impaired balance/gait;Impaired mobility;Mental status change  Follow up - Falls evaluation completed Falls evaluation completed - Falls prevention discussed;Education provided;Falls evaluation completed   Functional Status Survey:    There were no vitals filed for this visit. There is no height or weight on file to calculate BMI. Physical Exam Constitutional:      General: He is awake.  Pulmonary:     Effort: Pulmonary effort is normal.   Abdominal:     Comments: RUQ drain dressing clean   Musculoskeletal:     Right lower leg: No edema.     Left lower leg: No edema.  Neurological:     Mental Status: Mental status is at baseline.     Labs reviewed: Recent Labs    11/30/20 2005 12/02/20 0324  NA 132* 134*  K 4.2 3.8  CL 98 101  CO2 23 24  GLUCOSE 136* 137*  BUN 14 23  CREATININE 0.95 1.27*  CALCIUM 8.4* 8.6*   Recent Labs    11/30/20 2005 12/02/20 0324  AST 17 14*  ALT 13 11  ALKPHOS 117 103  BILITOT 1.4* 1.2  PROT 7.0 6.3*  ALBUMIN 2.9* 2.6*   Recent Labs    11/30/20 2005 12/02/20 0324  WBC 7.7 5.8  NEUTROABS 5.0 3.4  HGB 15.0 13.8  HCT 42.9 39.1  MCV 95.1 95.1  PLT 149* 133*   Lab Results  Component Value Date   TSH 4.92 09/16/2019   Lab Results  Component Value Date   HGBA1C 5.7 (H) 01/27/2019   Lab Results  Component Value Date   CHOL 140 01/27/2019   HDL 22 (L) 01/27/2019   LDLCALC 100 (H) 01/27/2019   TRIG 91 01/27/2019   CHOLHDL 6.4 01/27/2019    Significant Diagnostic Results in last 30 days:  DG Chest Port 1 View  Result Date: 11/30/2020 CLINICAL DATA:  Questionable sepsis.  Blood in urine. EXAM: PORTABLE CHEST 1 VIEW COMPARISON:  Most recent radiograph 10/13/2019 FINDINGS: Stable low lung volumes. Chronic cardiomegaly. Post TAVR. Aortic atherosclerosis and tortuosity. Streaky bibasilar opacities typically atelectasis. Elevation of left hemidiaphragm, increased. The patient's chin obscures the apices. No visualized pneumothorax or large pleural effusion. No pulmonary edema. IMPRESSION: 1. Low lung volumes with bibasilar opacities, typically atelectasis. 2. Increased elevation of the left hemidiaphragm likely due to atelectasis. 3. Chronic cardiomegaly.  Post TAVR. Aortic Atherosclerosis (ICD10-I70.0). Electronically Signed   By: Keith Rake M.D.   On: 11/30/2020 20:23   IR EXCHANGE BILIARY DRAIN  Result Date: 11/27/2020 INDICATION: 87 year old male with chronic  cholecystitis and choledocholithiasis. He comes in for routine percutaneous cholecystostomy tube exchange. EXAM: Cholecystostomy tube exchange MEDICATIONS: None. ANESTHESIA/SEDATION: None. FLUOROSCOPY TIME:  Fluoroscopy Time: 0 minutes 18 seconds (8 mGy). COMPLICATIONS: None immediate. PROCEDURE: Informed written consent was obtained from the patient after a thorough discussion of the procedural risks, benefits  and alternatives. All questions were addressed. Maximal Sterile Barrier Technique was utilized including caps, mask, sterile gowns, sterile gloves, sterile drape, hand hygiene and skin antiseptic. A timeout was performed prior to the initiation of the procedure. I hand injection of contrast material confirms that the existing tube is well positioned in the gallbladder lumen. The cystic duct is obstructed. The tube was transected and removed over an Amplatz wire. A new 25 French cook all-purpose drainage catheter was advanced over the wire and formed in the gallbladder. The catheter was then connected to bag drainage and secured to the skin with two separate 0 Prolene suture. IMPRESSION: Successful routine exchange of percutaneous cholecystostomy tube. Electronically Signed   By: Jacqulynn Cadet M.D.   On: 11/27/2020 16:14    Assessment/Plan 1. Acute cystitis with hematuria Hematuria resolved.condition back to baseline  Status post hospitalization treated with I.V.meropenema and discharged on Amoxicillin. - continue to encourage fluid intake. - will need follow up CBC/diff family will find out if patient's current [facility] Nurse can draw blood work then notify provider to order lab work.   2. Vascular dementia without behavioral disturbance (HCC) At baseline. - continue with supportive care.  3. Hyponatremia Na+ 132 ; 134  - will need repeat CMP  - POA will notify provider if their current HHN can draw lab work then will order labs  4. Thrombocytopenia (HCC) PLts 149; 133 possible cause of  Hematuria. Will recheck CBC/diff for follow up once family verifies if [facility] can draw blood work.  Family/ staff Communication: Reviewed plan of care with patient's wife and daughter will call provider's office once they verify if the current HHN can draw blood work then will order CBC/diff and CMP.Not sure if [facility] is from [facility] or private.   Labs/tests ordered: will need CBC/diff and CMP   Next Appointment: As needed if symptoms worsen or fail to improve.  I connected with  JETER TOMEY 's wife and daughter on 12/06/20 by a video enabled telemedicine application and verified that I am speaking with the correct person using two identifiers.Patient non-verbal.    I discussed the limitations of evaluation and management by telemedicine. The patient expressed understanding and agreed to proceed.   Spent 25 minutes of face to face on Mychart video  with patient's wife and daughter.    Sandrea Hughs, NP

## 2020-12-20 ENCOUNTER — Telehealth: Payer: Self-pay

## 2020-12-20 DIAGNOSIS — R8290 Unspecified abnormal findings in urine: Secondary | ICD-10-CM

## 2020-12-20 NOTE — Telephone Encounter (Signed)
Spoke with patients wife and informed her of Dr.Reed's response.  I called Tomasita Crumble, Home Health Physical Therapist and left message with Dr.Reed's recommendations

## 2020-12-20 NOTE — Telephone Encounter (Signed)
It sounds like William [name] needs to be hydrating better to prevent a strong odor to the urine.  Push po fluids.  If odor does not improve after 48 hrs of pushing fluids, obtain UA c+s to [facility].

## 2020-12-20 NOTE — Telephone Encounter (Signed)
Message left on clinical intake voicemail:   William [name] with [facility] (name of organization not provided) called requesting an order for a urinalysis and culture to be sent to [facility] on [address] as patients urine has a strong odor and Rob has a sneaky suspicion that patient may have another UTI (patient was recently hospitalized for a UTI)   Rod will handle collecting the urine and dropping it off if we will provide the order  Please advise   I will need diagnosis code if order is to be given

## 2020-12-22 ENCOUNTER — Other Ambulatory Visit: Payer: Self-pay

## 2020-12-22 DIAGNOSIS — R8290 Unspecified abnormal findings in urine: Secondary | ICD-10-CM | POA: Diagnosis not present

## 2020-12-22 NOTE — Telephone Encounter (Signed)
[facility] Nurse, [phone] called and stated that she is collecting urine and will drop off at our office to send out.  Order placed.

## 2020-12-22 NOTE — Telephone Encounter (Signed)
Patient's wife, Lelon Frohlich, called back and stated that patient's urine still has a strong odor.  Per Dr. Cyndi Lennert note on 12/20/20, Verbal order given for UA and C+S to Rob, He agreed and will collect.

## 2020-12-22 NOTE — Addendum Note (Signed)
Addended by: Rafael Bihari A on: 12/22/2020 02:01 PM   Modules accepted: Orders

## 2020-12-23 NOTE — Progress Notes (Signed)
Urinalysis appears positive.  Culture pending--await result while pushing fluids.

## 2020-12-24 LAB — URINALYSIS, ROUTINE W REFLEX MICROSCOPIC
Bilirubin Urine: NEGATIVE
Glucose, UA: NEGATIVE
Hgb urine dipstick: NEGATIVE
Hyaline Cast: NONE SEEN /LPF
Ketones, ur: NEGATIVE
Nitrite: NEGATIVE
RBC / HPF: NONE SEEN /HPF (ref 0–2)
Specific Gravity, Urine: 1.018 (ref 1.001–1.03)
Squamous Epithelial / HPF: NONE SEEN /HPF (ref <=5)
WBC, UA: >=60 /HPF — AB (ref 0–5)
pH: 5.5 (ref 5.0–8.0)

## 2020-12-24 LAB — URINE CULTURE
MICRO NUMBER:: 11641450
SPECIMEN QUALITY:: ADEQUATE

## 2020-12-25 ENCOUNTER — Other Ambulatory Visit: Payer: Self-pay

## 2020-12-25 MED ORDER — AMOXICILLIN 875 MG PO TABS: 875.0000 mg | ORAL_TABLET | Freq: Two times a day (BID) | ORAL | 0 refills | Status: DC

## 2020-12-25 NOTE — Progress Notes (Signed)
E coli UTI shows up on culture.  Appears susceptible to amoxicillin so will prescribe amoxicillin 875mg  po q 12h x 7days.  Please send to pharmacy of choice.

## 2021-01-09 ENCOUNTER — Other Ambulatory Visit: Payer: Self-pay

## 2021-01-09 MED ORDER — POTASSIUM CHLORIDE ER 10 MEQ PO TBCR: 10.0000 meq | EXTENDED_RELEASE_TABLET | Freq: Every day | ORAL | 2 refills | Status: DC

## 2021-01-23 ENCOUNTER — Other Ambulatory Visit (HOSPITAL_COMMUNITY): Payer: Self-pay | Admitting: Interventional Radiology

## 2021-01-23 ENCOUNTER — Other Ambulatory Visit: Payer: Self-pay

## 2021-01-23 ENCOUNTER — Ambulatory Visit (HOSPITAL_COMMUNITY)
Admission: RE | Admit: 2021-01-23 | Discharge: 2021-01-23 | Disposition: A | Discharge status: Home / Self Care | Payer: Medicare Other | Source: Ambulatory Visit | Attending: Interventional Radiology | Admitting: Interventional Radiology

## 2021-01-23 DIAGNOSIS — Z435 Encounter for attention to cystostomy: Secondary | ICD-10-CM | POA: Diagnosis not present

## 2021-01-23 DIAGNOSIS — K8050 Calculus of bile duct without cholangitis or cholecystitis without obstruction: Secondary | ICD-10-CM

## 2021-01-23 DIAGNOSIS — Z434 Encounter for attention to other artificial openings of digestive tract: Secondary | ICD-10-CM | POA: Diagnosis not present

## 2021-01-23 HISTORY — PX: IR EXCHANGE BILIARY DRAIN: IMG6046

## 2021-01-23 MED ORDER — LIDOCAINE HCL 1 % IJ SOLN
INTRAMUSCULAR | Status: AC | PRN
Start: 2021-01-23 — End: 2021-01-23
  Administered 2021-01-23: 5 mL via INTRADERMAL

## 2021-01-23 MED ORDER — IOHEXOL 300 MG/ML  SOLN
50.0000 mL | Freq: Once | INTRAMUSCULAR | Status: AC | PRN
  Administered 2021-01-23: 5 mL

## 2021-01-23 MED ORDER — LIDOCAINE HCL 1 % IJ SOLN
INTRAMUSCULAR | Status: AC
  Filled 2021-01-23: qty 20

## 2021-01-25 ENCOUNTER — Other Ambulatory Visit (HOSPITAL_COMMUNITY): Payer: Self-pay

## 2021-01-25 MED FILL — Sodium Chloride Flush IV Soln 0.9%: INTRAVENOUS | 90 days supply | Qty: 900 | Fill #0 | Status: AC

## 2021-01-26 ENCOUNTER — Other Ambulatory Visit (HOSPITAL_COMMUNITY): Payer: Self-pay

## 2021-02-06 DIAGNOSIS — K8689 Other specified diseases of pancreas: Secondary | ICD-10-CM | POA: Diagnosis not present

## 2021-02-06 DIAGNOSIS — I714 Abdominal aortic aneurysm, without rupture: Secondary | ICD-10-CM | POA: Diagnosis not present

## 2021-02-06 DIAGNOSIS — D692 Other nonthrombocytopenic purpura: Secondary | ICD-10-CM | POA: Diagnosis not present

## 2021-02-06 DIAGNOSIS — Z952 Presence of prosthetic heart valve: Secondary | ICD-10-CM | POA: Diagnosis not present

## 2021-02-06 DIAGNOSIS — R7301 Impaired fasting glucose: Secondary | ICD-10-CM | POA: Diagnosis not present

## 2021-02-06 DIAGNOSIS — I429 Cardiomyopathy, unspecified: Secondary | ICD-10-CM | POA: Diagnosis not present

## 2021-02-06 DIAGNOSIS — E669 Obesity, unspecified: Secondary | ICD-10-CM | POA: Diagnosis not present

## 2021-02-06 DIAGNOSIS — E7800 Pure hypercholesterolemia, unspecified: Secondary | ICD-10-CM | POA: Diagnosis not present

## 2021-02-06 DIAGNOSIS — I10 Essential (primary) hypertension: Secondary | ICD-10-CM | POA: Diagnosis not present

## 2021-02-06 DIAGNOSIS — I350 Nonrheumatic aortic (valve) stenosis: Secondary | ICD-10-CM | POA: Diagnosis not present

## 2021-02-06 DIAGNOSIS — I480 Paroxysmal atrial fibrillation: Secondary | ICD-10-CM | POA: Diagnosis not present

## 2021-02-21 ENCOUNTER — Telehealth (INDEPENDENT_AMBULATORY_CARE_PROVIDER_SITE_OTHER): Payer: Medicare Other | Admitting: Infectious Disease

## 2021-02-21 ENCOUNTER — Other Ambulatory Visit: Payer: Self-pay

## 2021-02-21 ENCOUNTER — Encounter: Payer: Self-pay | Admitting: Infectious Disease

## 2021-02-21 DIAGNOSIS — T827XXD Infection and inflammatory reaction due to other cardiac and vascular devices, implants and grafts, subsequent encounter: Secondary | ICD-10-CM | POA: Diagnosis not present

## 2021-02-21 DIAGNOSIS — R7881 Bacteremia: Secondary | ICD-10-CM | POA: Diagnosis not present

## 2021-02-21 DIAGNOSIS — K8309 Other cholangitis: Secondary | ICD-10-CM

## 2021-02-21 DIAGNOSIS — U071 COVID-19: Secondary | ICD-10-CM | POA: Diagnosis not present

## 2021-02-21 DIAGNOSIS — B952 Enterococcus as the cause of diseases classified elsewhere: Secondary | ICD-10-CM | POA: Diagnosis not present

## 2021-02-21 DIAGNOSIS — K8050 Calculus of bile duct without cholangitis or cholecystitis without obstruction: Secondary | ICD-10-CM | POA: Diagnosis not present

## 2021-02-21 DIAGNOSIS — Z95810 Presence of automatic (implantable) cardiac defibrillator: Secondary | ICD-10-CM | POA: Diagnosis not present

## 2021-02-21 DIAGNOSIS — B9689 Other specified bacterial agents as the cause of diseases classified elsewhere: Secondary | ICD-10-CM | POA: Diagnosis not present

## 2021-02-21 DIAGNOSIS — Z952 Presence of prosthetic heart valve: Secondary | ICD-10-CM

## 2021-02-21 DIAGNOSIS — K8064 Calculus of gallbladder and bile duct with chronic cholecystitis without obstruction: Secondary | ICD-10-CM | POA: Diagnosis not present

## 2021-02-21 DIAGNOSIS — F0390 Unspecified dementia without behavioral disturbance: Secondary | ICD-10-CM

## 2021-02-21 DIAGNOSIS — A498 Other bacterial infections of unspecified site: Secondary | ICD-10-CM

## 2021-02-21 DIAGNOSIS — B961 Klebsiella pneumoniae [K. pneumoniae] as the cause of diseases classified elsewhere: Secondary | ICD-10-CM

## 2021-02-21 NOTE — Progress Notes (Signed)
Virtual Visit via Telephone Note  I connected with NAZIER NEYHART and his wife on 02/21/21 at  9:30 AM EDT  HCPO, his wife [name], retail by telephone and verified that I am speaking with the correct person using two identifiers.  Location: Patient: Home Provider: [facility]   I discussed the limitations, risks, security and privacy concerns of performing an evaluation and management service by telephone and the availability of in person appointments. I also discussed with the patient's surrogate (his wife)  that there may be a patient responsible charge related to this service. The patients' surrogate expressed understanding and agreed to proceed.   History of Present Illness:  87 y.o.malewithrecurrent episodes of cholangitis and sepsis at times with bacteremias including a time when he had to have his ICD removed, again remitted admitted with cholangitis and sepsis in the context of an obstructed cholecystostomy tube, and concern for potential obstruction of his biliary tree due to stones.  He was found not to be a surgical candidate which seems quite reasonable to me given his multiple comorbidities including his dementia.  GI were able to remove multiple stones during his admission and cultures from the biliary tree yielded ampicillin sensitive enterococcal species and an Enterobacter species.  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  Patient was met with with palliative care on multiple occasions but his family continued to want "aggressive care".  They have been seeming to want to have a second opinion from a surgeon which seems not very realistic especially in the current environment.  Ultimately arrangements were made for him to continue on the meropenem and later changed to  Augmentin and Bactrim and has done  well.   He has been seen by interventional radiology which have changed his drain.  They cultured his bile  which had multiple calculi in it but it did not appear purulent or not documented as such. Culture was taken which I woud NOT have preferred UNLESS it was purulent. Cx growing GPC--VRE which we did not treat  Drain has been upsized and he has been seen at [facility] for consultation re means of removing as many of stones from biliary tree as possible without resorting to surgery  He had removal of further bile and stones via interventional radiology at [facility].  He was seen at [facility] and underwent 1. Percutaneous gallstone extraction.  2. Cystic duct recanalization and balloon dilation  3. Cholecystostomy tube exchange. on August the 4th, 2020  They found   1. Minimal residual cholelithiasis with successful clearance of the  remaining gallstones.  2. Focal stenosis and occlusion of the cystic duct with successful  recanalization and balloon dilation to 60m. The cystic duct is now widely  patent.  3. Choledocholithiasis without evidence of CBD obstruction.  4 Successful exchange of a new 16 french cholecystostomy tube.   Given the large amount of gallstones found in GB decision made to leave the cholecystostomy in place and have it exchanged every 3 months  I did  think we can trial him off of antibiotics this visit  The family wanted and have stopped the ACTIGALl though I would recommend continuing it. They are to talk with GI re this.  JSadiehad in the interim developed swelling of what sounds like his parotid gland that is been present for roughly 10 days.  IIt did  not improved he does not have fevers chills confusion or other  systemic symptoms.  I was concerned however he could have a MRSA infection in this area that might be resistant to the Bactrim that he is been taking chronically. I placed him on doxycycline and this resolved.   Since then he developed some sleepiness along with a low-grade fever.  His urine has become darker and they had  send it for analysis and culture with his primary care physician.  The organism isolated was fairly unusual 1 and was resistant to most antimicrobials that were orally available other than Macrobid.  He was initially prescribed Bactrim but then later Macrobid and apparently the urine "cleared up.  However his sleepiness has persisted may have even worsened in the last week.  He is also developed a pressure ulcer on his sacral area that was evaluated by wound care   He was seen by interventional radiology and who changed his drain and noted that he had now obstruction of his cystic duct by a stone.  I was apprehensive that he may be harboringInfection now at this obstructed site that could put him at risk for recurrent cholangitis and worse case scenario recurrent bacteremia.  I arrangde for him to be seen in clinic in late October 2020.  On exam in clinic he is fairly somnolent wearing a mask could  barely answer any questions and is wearing mittens to restrain him from pulling out his drain.   In December he began having fevers that turned out that this was the beginning of COVID-19 infection.  Both he and his wife tested positive.  I was able to help his wife get in contact with the monoclonal antibody infusion clinic and she received a dose of monoclonal antibodies.  They were not able to get [facility] out of the house to get him infused with antibodies.  He also was evaluated in the ER but felt stable for discharge back to home and he and his wife both recovered from this infection at home.  He  had no further evidence of recurrent cholangitis.  His drain is again been change in the frequency of pain exchanges been changed every 5 weeks.  He has been off antibiotics   Drain exchange changed to every 10 weeks  He unfortunately has been nonverbal and largely bedbound since having his COVID-19 infection last year.  He has not had any evidence of infection at the biliary site.  Vitals from  today:  His vital signs for today are as follows 96.8 hr 85 pox 98 118/80 150 ml drain   Did have an admission to the hospital with hematuria and cystitis.  Had antibiotics for this clearly.  More recently he was diagnosed with urinary tract infection and treated with Augmentin.  Cristela Blue has not been on systemic antibiotics  Still every 10 week exchange of drain.   Past Medical History:  Diagnosis Date  . AAA (abdominal aortic aneurysm) ([place])    7/13 3.8cm  . Arthritis    "joints" (01/11/2014)  . Asthma    "seasonal; some foods"   . Chronic systolic CHF (congestive heart failure) ([place])   . COVID-19 12/15/2019  . Dementia ([place])   . HLD (hyperlipidemia)   . HOH (hard of hearing)   . Lumbar vertebral fracture ([facility])    L1- 04/11/2014   . Parotitis 06/10/2019  . Prostate cancer ([place])   . S/P ICD (internal cardiac defibrillator) procedure, 01/11/14 removal of ERI gen and placement of Medtronic Evera XT VR & NEW Right ventricular lead Medtronic 01/12/2014  .  S/P TAVR (transcatheter aortic valve replacement)    Edwards Sapien 3 THV (size 29 mm, model # [id number], serial # [id number])  . Severe aortic stenosis   . Sleep apnea    "lost 60# & don't have it anymore" (01/11/2014)  . Somnolence 08/04/2019  . UTI (urinary tract infection) 08/04/2019  . Ventricular tachycardia [facility])     Past Surgical History:  Procedure Laterality Date  . CARDIAC CATHETERIZATION  08/20/2004   noncritical CAD,mild global hypokinesis, EF 50%  . CARDIAC DEFIBRILLATOR PLACEMENT  08/23/2004   Medtronic  . CATARACT EXTRACTION W/ INTRAOCULAR LENS IMPLANT Right   . COLECTOMY  1990's  . CRYOABLATION N/A 02/24/2014   Procedure: CRYO ABLATION PROSTATE;  Surgeon: Ailene Rud, MD;  Location: [facility] ORS;  Service: Urology;  Laterality: N/A;  . ERCP N/A 01/29/2019   Procedure: ENDOSCOPIC RETROGRADE CHOLANGIOPANCREATOGRAPHY (ERCP);  Surgeon: Milus Banister, MD;  Location: [facility] ENDOSCOPY;  Service: Endoscopy;  Laterality:  N/A;  . HERNIA REPAIR     "abdomen; from colon OR"  . ICD LEAD REMOVAL N/A 09/15/2018   Procedure: ICD LEAD REMOVAL EXTRACTION;  Surgeon: Evans Lance, MD;  Location: [facility] OR;  Service: Cardiovascular;  Laterality: N/A;  DR. BARTLE TO BACK UP  . IMPLANTABLE CARDIOVERTER DEFIBRILLATOR (ICD) GENERATOR CHANGE N/A 01/11/2014   Procedure: ICD GENERATOR CHANGE;  Surgeon: Sanda Klein, MD;  Location: [place] CATH LAB;  Service: Cardiovascular;  Laterality: N/A;  . IR CATHETER TUBE CHANGE  03/01/2019  . IR CATHETER TUBE CHANGE  04/05/2019  . IR CATHETER TUBE CHANGE  07/05/2019  . IR CATHETER TUBE CHANGE  08/02/2019  . IR CATHETER TUBE CHANGE  08/30/2019  . IR CATHETER TUBE CHANGE  09/27/2019  . IR CATHETER TUBE CHANGE  10/25/2019  . IR CATHETER TUBE CHANGE  11/29/2019  . IR EXCHANGE BILIARY DRAIN  12/18/2018  . IR EXCHANGE BILIARY DRAIN  01/05/2019  . IR EXCHANGE BILIARY DRAIN  01/25/2019  . IR EXCHANGE BILIARY DRAIN  01/13/2020  . IR EXCHANGE BILIARY DRAIN  02/28/2020  . IR EXCHANGE BILIARY DRAIN  04/24/2020  . IR EXCHANGE BILIARY DRAIN  07/03/2020  . IR EXCHANGE BILIARY DRAIN  09/14/2020  . IR EXCHANGE BILIARY DRAIN  11/27/2020  . IR EXCHANGE BILIARY DRAIN  01/23/2021  . IR PERC CHOLECYSTOSTOMY  11/22/2018  . IR RADIOLOGIST EVAL & MGMT  02/26/2019  . IR RADIOLOGIST EVAL & MGMT  03/17/2019  . IR SINUS/FIST TUBE CHK-NON GI  01/28/2019  . LEAD REVISION N/A 01/11/2014   Procedure: LEAD REVISION;  Surgeon: Sanda Klein, MD;  Location: [place] CATH LAB;  Service: Cardiovascular;  Laterality: N/A;  . NM MYOCAR PERF WALL MOTION  01/29/2012   abnormal c/o infarct/scar,no ischemia present  . PROSTATE BIOPSY N/A 11/22/2013   Procedure: PROSTATE BIOPSY AND ULTRASOUND;  Surgeon: Ailene Rud, MD;  Location: [facility] ORS;  Service: Urology;  Laterality: N/A;  . REMOVAL OF STONES  01/29/2019   Procedure: REMOVAL OF STONES;  Surgeon: Milus Banister, MD;  Location: [facility] ENDOSCOPY;  Service: Endoscopy;;  . RIGHT/LEFT HEART CATH AND  CORONARY ANGIOGRAPHY N/A 07/06/2018   Procedure: RIGHT/LEFT HEART CATH AND CORONARY ANGIOGRAPHY;  Surgeon: Troy Sine, MD;  Location: [place] CV LAB;  Service: Cardiovascular;  Laterality: N/A;  . SPHINCTEROTOMY  01/29/2019   Procedure: SPHINCTEROTOMY;  Surgeon: Milus Banister, MD;  Location: [facility] ENDOSCOPY;  Service: Endoscopy;;  . TEE WITHOUT CARDIOVERSION N/A 08/18/2018   Procedure: TRANSESOPHAGEAL ECHOCARDIOGRAM (TEE);  Surgeon: Burnell Blanks, MD;  Location: [facility] OR;  Service: Open Heart Surgery;  Laterality: N/A;  . TEE WITHOUT CARDIOVERSION N/A 09/08/2018   Procedure: TRANSESOPHAGEAL ECHOCARDIOGRAM (TEE);  Surgeon: Lelon Perla, MD;  Location: [facility] ENDOSCOPY;  Service: Cardiovascular;  Laterality: N/A;  . TEE WITHOUT CARDIOVERSION N/A 09/15/2018   Procedure: TRANSESOPHAGEAL ECHOCARDIOGRAM (TEE);  Surgeon: Evans Lance, MD;  Location: [facility] OR;  Service: Cardiovascular;  Laterality: N/A;  . TEE WITHOUT CARDIOVERSION N/A 11/26/2018   Procedure: TRANSESOPHAGEAL ECHOCARDIOGRAM (TEE);  Surgeon: Jerline Pain, MD;  Location: [facility] ENDOSCOPY;  Service: Cardiovascular;  Laterality: N/A;  . TRANSCATHETER AORTIC VALVE REPLACEMENT, TRANSFEMORAL  08/18/2018  . TRANSCATHETER AORTIC VALVE REPLACEMENT, TRANSFEMORAL N/A 08/18/2018   Procedure: TRANSCATHETER AORTIC VALVE REPLACEMENT, TRANSFEMORAL using an Edwards 13m Aortic Valve;  Surgeon: MBurnell Blanks MD;  [place]  Service: Open Heart Surgery;  Laterality: N/A;    Family History  Problem Relation Age of Onset  . Heart failure Mother        Died of "old age" at 924 . Pneumonia Father       Social History   Socioeconomic History  . Marital status: Married    Spouse name: Not on file  . Number of children: 2  . Years of education: Not on file  . Highest education level: Not on file  Occupational History  . Occupation: Owned a rScientist, [employer]"  Tobacco Use  . Smoking status: Never Smoker  . Smokeless  tobacco: Never Used  Vaping Use  . Vaping Use: Never used  Substance and Sexual Activity  . Alcohol use: Not Currently    Alcohol/week: 0.0 standard drinks    Comment: 01/11/2014 "drink 1/2 of a beer twice per month   . Drug use: No  . Sexual activity: Not Currently  Other Topics Concern  . Not on file  Social History Narrative  . Not on file   Social Determinants of Health   Financial Resource Strain: Not on file  Food Insecurity: Not on file  Transportation Needs: Not on file  Physical Activity: Not on file  Stress: Not on file  Social Connections: Not on file    Allergies  Allergen Reactions  . Crestor [Rosuvastatin] Other (See Comments)    Hurts muscles  . Lipitor [Atorvastatin] Other (See Comments)    Hurts stomach  . Shrimp [Shellfish Allergy] Other (See Comments)    On MAR     Current Outpatient Medications:  .  acetaminophen (TYLENOL) 500 MG tablet, Take 500 mg by mouth every 6 (six) hours as needed (for pain). , Disp: , Rfl:  .  amiodarone (PACERONE) 200 MG tablet, TAKE 1 TABLET(200 MG) BY MOUTH DAILY, Disp: 45 tablet, Rfl: 3 .  aspirin EC 81 MG tablet, Take 81 mg by mouth every Monday, Wednesday, and Friday., Disp: , Rfl:  .  cholecalciferol (VITAMIN D) 1000 units tablet, Take 2,000 Units by mouth at bedtime., Disp: , Rfl:  .  Cranberry-Vit C-Lactobacillus (RA CRANBERRY SUPPLEMENTS PO), Take by mouth in the morning and at bedtime. , Disp: , Rfl:  .  loratadine (CLARITIN) 10 MG tablet, Take 10 mg by mouth daily., Disp: , Rfl:  .  Multiple Vitamins-Minerals (PRESERVISION AREDS 2 PO), Take by mouth. Take one tablet by mouth dailey , Disp: , Rfl:  .  polyethylene glycol (MIRALAX / GLYCOLAX) 17 g packet, Take 17 g by mouth as needed., Disp: , Rfl:  .  potassium chloride (KLOR-CON) 10 MEQ tablet, Take 1 tablet (10 mEq total) by  mouth daily., Disp: 90 tablet, Rfl: 2 .  Probiotic Product (PROBIOTIC DAILY PO), Take 1 capsule by mouth daily., Disp: , Rfl:  .  Sodium  Chloride Flush (NORMAL SALINE FLUSH) 0.9 % SOLN, FLUSH WITH 10MLS AS DIRECTED, Disp: 900 mL, Rfl: 3 .  ursodiol (ACTIGALL) 300 MG capsule, TAKE 2 CAPSULES(600 MG) BY MOUTH TWICE DAILY, Disp: 360 capsule, Rfl: 3 .  zinc gluconate 50 MG tablet, Take 50 mg by mouth daily., Disp: , Rfl:  .  amoxicillin (AMOXIL) 875 MG tablet, Take 1 tablet (875 mg total) by mouth 2 (two) times daily. (Patient not taking: Reported on 02/21/2021), Disp: 14 tablet, Rfl: 0   Review of Systems  Unable to perform ROS: Dementia           Assessment & Plan:     Observations/Objective:  Mr. Glennon Hamilton continues to be largely bedbound occasionally being taken from bed to chair.  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  He will continue to have drain exchange and we have scheduled a telephone visit visit again in next appointment in May  COVID infection: This was another blow to his functional status and he has been as mentioned bedbound since then.       .Follow Up Instructions:    I discussed the assessment and treatment plan with the patient. The patient was provided an opportunity to ask questions and all were answered. The patient agreed with the plan and demonstrated an understanding of the instructions.   The patient was advised to call back or seek an in-person evaluation if the symptoms worsen or if the condition fails to improve as anticipated.  I provided 22 minutes of non-face-to-face time during this encounter.   Alcide Evener, MD

## 2021-03-30 ENCOUNTER — Other Ambulatory Visit (HOSPITAL_COMMUNITY): Payer: Self-pay | Admitting: Interventional Radiology

## 2021-03-30 ENCOUNTER — Other Ambulatory Visit: Payer: Self-pay

## 2021-03-30 ENCOUNTER — Ambulatory Visit (HOSPITAL_COMMUNITY)
Admission: RE | Admit: 2021-03-30 | Discharge: 2021-03-30 | Disposition: A | Discharge status: Home / Self Care | Payer: Medicare Other | Source: Ambulatory Visit | Attending: Interventional Radiology | Admitting: Interventional Radiology

## 2021-03-30 DIAGNOSIS — T85590A Other mechanical complication of bile duct prosthesis, initial encounter: Secondary | ICD-10-CM | POA: Diagnosis not present

## 2021-03-30 DIAGNOSIS — K8050 Calculus of bile duct without cholangitis or cholecystitis without obstruction: Secondary | ICD-10-CM | POA: Insufficient documentation

## 2021-03-30 DIAGNOSIS — Z434 Encounter for attention to other artificial openings of digestive tract: Secondary | ICD-10-CM | POA: Diagnosis not present

## 2021-03-30 HISTORY — PX: IR EXCHANGE BILIARY DRAIN: IMG6046

## 2021-03-30 MED ORDER — LIDOCAINE HCL 1 % IJ SOLN
INTRAMUSCULAR | Status: AC
  Filled 2021-03-30: qty 20

## 2021-03-30 MED ORDER — IOHEXOL 300 MG/ML  SOLN
10.0000 mL | Freq: Once | INTRAMUSCULAR | Status: AC | PRN
  Administered 2021-03-30: 15:00:00 5 mL

## 2021-04-10 ENCOUNTER — Other Ambulatory Visit: Payer: Self-pay | Admitting: Cardiovascular Disease

## 2021-05-05 ENCOUNTER — Other Ambulatory Visit (HOSPITAL_COMMUNITY): Payer: Self-pay

## 2021-05-06 ENCOUNTER — Other Ambulatory Visit: Payer: Self-pay | Admitting: Cardiovascular Disease

## 2021-05-09 ENCOUNTER — Other Ambulatory Visit (HOSPITAL_COMMUNITY): Payer: Self-pay

## 2021-05-11 ENCOUNTER — Other Ambulatory Visit (HOSPITAL_COMMUNITY): Payer: Self-pay

## 2021-05-16 ENCOUNTER — Other Ambulatory Visit (HOSPITAL_COMMUNITY): Payer: Self-pay

## 2021-05-21 ENCOUNTER — Other Ambulatory Visit (HOSPITAL_COMMUNITY): Payer: Self-pay

## 2021-05-22 ENCOUNTER — Other Ambulatory Visit (HOSPITAL_COMMUNITY): Payer: Self-pay | Admitting: Physician Assistant

## 2021-05-22 ENCOUNTER — Other Ambulatory Visit (HOSPITAL_COMMUNITY): Payer: Self-pay

## 2021-05-22 MED ORDER — NORMAL SALINE FLUSH 0.9 % IV SOLN
5.0000 mL | Freq: Two times a day (BID) | INTRAVENOUS | 0 refills | Status: DC
  Filled 2021-05-22: qty 600, 30d supply, fill #0
  Filled 2021-06-07: qty 600, 60d supply, fill #0

## 2021-05-30 ENCOUNTER — Other Ambulatory Visit (HOSPITAL_COMMUNITY): Payer: Self-pay

## 2021-06-07 ENCOUNTER — Other Ambulatory Visit (HOSPITAL_COMMUNITY): Payer: Self-pay

## 2021-06-08 ENCOUNTER — Other Ambulatory Visit: Payer: Self-pay

## 2021-06-08 ENCOUNTER — Ambulatory Visit (HOSPITAL_COMMUNITY)
Admission: RE | Admit: 2021-06-08 | Discharge: 2021-06-08 | Disposition: A | Discharge status: Home / Self Care | Payer: Medicare Other | Source: Ambulatory Visit | Attending: Interventional Radiology | Admitting: Interventional Radiology

## 2021-06-08 ENCOUNTER — Other Ambulatory Visit (HOSPITAL_COMMUNITY): Payer: Self-pay | Admitting: Interventional Radiology

## 2021-06-08 DIAGNOSIS — Z435 Encounter for attention to cystostomy: Secondary | ICD-10-CM | POA: Insufficient documentation

## 2021-06-08 DIAGNOSIS — K8066 Calculus of gallbladder and bile duct with acute and chronic cholecystitis without obstruction: Secondary | ICD-10-CM | POA: Diagnosis not present

## 2021-06-08 DIAGNOSIS — K8050 Calculus of bile duct without cholangitis or cholecystitis without obstruction: Secondary | ICD-10-CM | POA: Insufficient documentation

## 2021-06-08 HISTORY — PX: IR EXCHANGE BILIARY DRAIN: IMG6046

## 2021-06-08 MED ORDER — IOHEXOL 300 MG/ML  SOLN
25.0000 mL | Freq: Once | INTRAMUSCULAR | Status: AC | PRN
  Administered 2021-06-08: 5 mL

## 2021-06-08 MED ORDER — LIDOCAINE HCL 1 % IJ SOLN
INTRAMUSCULAR | Status: AC
  Administered 2021-06-08: 2 mL via SUBCUTANEOUS
  Filled 2021-06-08: qty 20

## 2021-06-21 ENCOUNTER — Emergency Department (HOSPITAL_COMMUNITY): Payer: Medicare Other

## 2021-06-21 ENCOUNTER — Other Ambulatory Visit: Payer: Self-pay

## 2021-06-21 ENCOUNTER — Inpatient Hospital Stay (HOSPITAL_COMMUNITY)
Admission: EM | Admit: 2021-06-21 | Discharge: 2021-06-26 | DRG: 871 | Disposition: A | Discharge status: Home Health | Payer: Medicare Other | Attending: Internal Medicine | Admitting: Internal Medicine

## 2021-06-21 ENCOUNTER — Encounter (HOSPITAL_COMMUNITY): Payer: Self-pay | Admitting: Emergency Medicine

## 2021-06-21 DIAGNOSIS — I5022 Chronic systolic (congestive) heart failure: Secondary | ICD-10-CM | POA: Diagnosis present

## 2021-06-21 DIAGNOSIS — C61 Malignant neoplasm of prostate: Secondary | ICD-10-CM

## 2021-06-21 DIAGNOSIS — I350 Nonrheumatic aortic (valve) stenosis: Secondary | ICD-10-CM | POA: Diagnosis present

## 2021-06-21 DIAGNOSIS — R0689 Other abnormalities of breathing: Secondary | ICD-10-CM | POA: Diagnosis not present

## 2021-06-21 DIAGNOSIS — Z7189 Other specified counseling: Secondary | ICD-10-CM | POA: Diagnosis not present

## 2021-06-21 DIAGNOSIS — Z7401 Bed confinement status: Secondary | ICD-10-CM | POA: Diagnosis not present

## 2021-06-21 DIAGNOSIS — I2510 Atherosclerotic heart disease of native coronary artery without angina pectoris: Secondary | ICD-10-CM | POA: Diagnosis present

## 2021-06-21 DIAGNOSIS — Z952 Presence of prosthetic heart valve: Secondary | ICD-10-CM | POA: Diagnosis not present

## 2021-06-21 DIAGNOSIS — Z8249 Family history of ischemic heart disease and other diseases of the circulatory system: Secondary | ICD-10-CM

## 2021-06-21 DIAGNOSIS — Z66 Do not resuscitate: Secondary | ICD-10-CM | POA: Diagnosis present

## 2021-06-21 DIAGNOSIS — H9190 Unspecified hearing loss, unspecified ear: Secondary | ICD-10-CM | POA: Diagnosis present

## 2021-06-21 DIAGNOSIS — I959 Hypotension, unspecified: Secondary | ICD-10-CM | POA: Diagnosis not present

## 2021-06-21 DIAGNOSIS — A419 Sepsis, unspecified organism: Secondary | ICD-10-CM | POA: Diagnosis not present

## 2021-06-21 DIAGNOSIS — R4182 Altered mental status, unspecified: Secondary | ICD-10-CM | POA: Diagnosis not present

## 2021-06-21 DIAGNOSIS — R319 Hematuria, unspecified: Secondary | ICD-10-CM

## 2021-06-21 DIAGNOSIS — R0902 Hypoxemia: Secondary | ICD-10-CM | POA: Diagnosis not present

## 2021-06-21 DIAGNOSIS — Z8679 Personal history of other diseases of the circulatory system: Secondary | ICD-10-CM | POA: Diagnosis not present

## 2021-06-21 DIAGNOSIS — K8309 Other cholangitis: Secondary | ICD-10-CM | POA: Diagnosis present

## 2021-06-21 DIAGNOSIS — R000 Tachycardia, unspecified: Secondary | ICD-10-CM | POA: Diagnosis not present

## 2021-06-21 DIAGNOSIS — I248 Other forms of acute ischemic heart disease: Secondary | ICD-10-CM | POA: Diagnosis present

## 2021-06-21 DIAGNOSIS — E876 Hypokalemia: Secondary | ICD-10-CM | POA: Diagnosis not present

## 2021-06-21 DIAGNOSIS — I517 Cardiomegaly: Secondary | ICD-10-CM | POA: Diagnosis not present

## 2021-06-21 DIAGNOSIS — Z515 Encounter for palliative care: Secondary | ICD-10-CM

## 2021-06-21 DIAGNOSIS — I4891 Unspecified atrial fibrillation: Secondary | ICD-10-CM | POA: Diagnosis present

## 2021-06-21 DIAGNOSIS — Z8546 Personal history of malignant neoplasm of prostate: Secondary | ICD-10-CM

## 2021-06-21 DIAGNOSIS — R0682 Tachypnea, not elsewhere classified: Secondary | ICD-10-CM

## 2021-06-21 DIAGNOSIS — E785 Hyperlipidemia, unspecified: Secondary | ICD-10-CM | POA: Diagnosis present

## 2021-06-21 DIAGNOSIS — J45909 Unspecified asthma, uncomplicated: Secondary | ICD-10-CM | POA: Diagnosis present

## 2021-06-21 DIAGNOSIS — R918 Other nonspecific abnormal finding of lung field: Secondary | ICD-10-CM | POA: Diagnosis not present

## 2021-06-21 DIAGNOSIS — A4151 Sepsis due to Escherichia coli [E. coli]: Secondary | ICD-10-CM | POA: Diagnosis present

## 2021-06-21 DIAGNOSIS — B9620 Unspecified Escherichia coli [E. coli] as the cause of diseases classified elsewhere: Secondary | ICD-10-CM | POA: Diagnosis present

## 2021-06-21 DIAGNOSIS — Z8616 Personal history of COVID-19: Secondary | ICD-10-CM

## 2021-06-21 DIAGNOSIS — G9341 Metabolic encephalopathy: Secondary | ICD-10-CM | POA: Diagnosis present

## 2021-06-21 DIAGNOSIS — Z8619 Personal history of other infectious and parasitic diseases: Secondary | ICD-10-CM

## 2021-06-21 DIAGNOSIS — R404 Transient alteration of awareness: Secondary | ICD-10-CM | POA: Diagnosis not present

## 2021-06-21 DIAGNOSIS — R17 Unspecified jaundice: Secondary | ICD-10-CM | POA: Diagnosis present

## 2021-06-21 DIAGNOSIS — Z95810 Presence of automatic (implantable) cardiac defibrillator: Secondary | ICD-10-CM

## 2021-06-21 DIAGNOSIS — R5381 Other malaise: Secondary | ICD-10-CM | POA: Diagnosis not present

## 2021-06-21 DIAGNOSIS — I700 Atherosclerosis of aorta: Secondary | ICD-10-CM | POA: Diagnosis not present

## 2021-06-21 DIAGNOSIS — R509 Fever, unspecified: Secondary | ICD-10-CM | POA: Diagnosis not present

## 2021-06-21 DIAGNOSIS — K811 Chronic cholecystitis: Secondary | ICD-10-CM | POA: Diagnosis present

## 2021-06-21 DIAGNOSIS — J189 Pneumonia, unspecified organism: Secondary | ICD-10-CM | POA: Diagnosis not present

## 2021-06-21 DIAGNOSIS — N390 Urinary tract infection, site not specified: Secondary | ICD-10-CM | POA: Diagnosis present

## 2021-06-21 DIAGNOSIS — E872 Acidosis, unspecified: Secondary | ICD-10-CM

## 2021-06-21 DIAGNOSIS — Z9049 Acquired absence of other specified parts of digestive tract: Secondary | ICD-10-CM

## 2021-06-21 DIAGNOSIS — Z711 Person with feared health complaint in whom no diagnosis is made: Secondary | ICD-10-CM | POA: Diagnosis not present

## 2021-06-21 DIAGNOSIS — F0390 Unspecified dementia without behavioral disturbance: Secondary | ICD-10-CM | POA: Diagnosis not present

## 2021-06-21 DIAGNOSIS — Z79899 Other long term (current) drug therapy: Secondary | ICD-10-CM

## 2021-06-21 LAB — I-STAT CHEM 8, ED
BUN: 22 mg/dL (ref 8–23)
Calcium, Ion: 1.13 mmol/L — ABNORMAL LOW (ref 1.15–1.40)
Chloride: 104 mmol/L (ref 98–111)
Creatinine, Ser: 1 mg/dL (ref 0.61–1.24)
Glucose, Bld: 191 mg/dL — ABNORMAL HIGH (ref 70–99)
HCT: 40 % (ref 39.0–52.0)
Hemoglobin: 13.6 g/dL (ref 13.0–17.0)
Potassium: 4.3 mmol/L (ref 3.5–5.1)
Sodium: 137 mmol/L (ref 135–145)
TCO2: 23 mmol/L (ref 22–32)

## 2021-06-21 LAB — URINALYSIS, ROUTINE W REFLEX MICROSCOPIC
Bilirubin Urine: NEGATIVE
Glucose, UA: NEGATIVE mg/dL
Ketones, ur: NEGATIVE mg/dL
Nitrite: NEGATIVE
Protein, ur: 30 mg/dL — AB
Specific Gravity, Urine: 1.024 (ref 1.005–1.030)
WBC, UA: >50 WBC/hpf — ABNORMAL HIGH (ref 0–5)
pH: 5 (ref 5.0–8.0)

## 2021-06-21 LAB — CBC WITH DIFFERENTIAL/PLATELET
Abs Immature Granulocytes: 0.11 10*3/uL — ABNORMAL HIGH (ref 0.00–0.07)
Basophils Absolute: 0 10*3/uL (ref 0.0–0.1)
Basophils Relative: 0 %
Eosinophils Absolute: 0 10*3/uL (ref 0.0–0.5)
Eosinophils Relative: 0 %
HCT: 41.6 % (ref 39.0–52.0)
Hemoglobin: 14.2 g/dL (ref 13.0–17.0)
Immature Granulocytes: 1 %
Lymphocytes Relative: 4 %
Lymphs Abs: 0.6 10*3/uL — ABNORMAL LOW (ref 0.7–4.0)
MCH: 34.1 pg — ABNORMAL HIGH (ref 26.0–34.0)
MCHC: 34.1 g/dL (ref 30.0–36.0)
MCV: 99.8 fL (ref 80.0–100.0)
Monocytes Absolute: 1.2 10*3/uL — ABNORMAL HIGH (ref 0.1–1.0)
Monocytes Relative: 7 %
Neutro Abs: 15.7 10*3/uL — ABNORMAL HIGH (ref 1.7–7.7)
Neutrophils Relative %: 88 %
Platelets: 139 10*3/uL — ABNORMAL LOW (ref 150–400)
RBC: 4.17 MIL/uL — ABNORMAL LOW (ref 4.22–5.81)
RDW: 15.4 % (ref 11.5–15.5)
WBC: 17.8 10*3/uL — ABNORMAL HIGH (ref 4.0–10.5)
nRBC: 0 % (ref 0.0–0.2)

## 2021-06-21 LAB — TROPONIN I (HIGH SENSITIVITY): Troponin I (High Sensitivity): 22 ng/L — ABNORMAL HIGH (ref <18)

## 2021-06-21 LAB — COMPREHENSIVE METABOLIC PANEL
ALT: 10 U/L (ref 0–44)
AST: 17 U/L (ref 15–41)
Albumin: 3 g/dL — ABNORMAL LOW (ref 3.5–5.0)
Alkaline Phosphatase: 106 U/L (ref 38–126)
Anion gap: 10 (ref 5–15)
BUN: 21 mg/dL (ref 8–23)
CO2: 22 mmol/L (ref 22–32)
Calcium: 8.6 mg/dL — ABNORMAL LOW (ref 8.9–10.3)
Chloride: 103 mmol/L (ref 98–111)
Creatinine, Ser: 1.11 mg/dL (ref 0.61–1.24)
GFR, Estimated: >60 mL/min (ref >60)
Glucose, Bld: 191 mg/dL — ABNORMAL HIGH (ref 70–99)
Potassium: 4.3 mmol/L (ref 3.5–5.1)
Sodium: 135 mmol/L (ref 135–145)
Total Bilirubin: 2.2 mg/dL — ABNORMAL HIGH (ref 0.3–1.2)
Total Protein: 7 g/dL (ref 6.5–8.1)

## 2021-06-21 LAB — LACTIC ACID, PLASMA
Lactic Acid, Venous: 2.3 mmol/L (ref 0.5–1.9)
Lactic Acid, Venous: 2.3 mmol/L (ref 0.5–1.9)
Lactic Acid, Venous: 2.6 mmol/L (ref 0.5–1.9)

## 2021-06-21 LAB — MAGNESIUM: Magnesium: 2.2 mg/dL (ref 1.7–2.4)

## 2021-06-21 LAB — BRAIN NATRIURETIC PEPTIDE: B Natriuretic Peptide: 208.9 pg/mL — ABNORMAL HIGH (ref 0.0–100.0)

## 2021-06-21 LAB — RESP PANEL BY RT-PCR (FLU A&B, COVID) ARPGX2
Influenza A by PCR: NEGATIVE
Influenza B by PCR: NEGATIVE
SARS Coronavirus 2 by RT PCR: NEGATIVE

## 2021-06-21 LAB — TSH: TSH: 2.773 u[IU]/mL (ref 0.350–4.500)

## 2021-06-21 MED ORDER — SODIUM CHLORIDE 0.9 % IV SOLN
500.0000 mg | Freq: Once | INTRAVENOUS | Status: AC
  Administered 2021-06-21: 500 mg via INTRAVENOUS
  Filled 2021-06-21: qty 500

## 2021-06-21 MED ORDER — SODIUM CHLORIDE 0.9 % IV SOLN
2.0000 g | Freq: Two times a day (BID) | INTRAVENOUS | Status: DC
  Administered 2021-06-21: 2 g via INTRAVENOUS
  Filled 2021-06-21: qty 2

## 2021-06-21 MED ORDER — ZINC SULFATE 220 (50 ZN) MG PO CAPS
220.0000 mg | ORAL_CAPSULE | Freq: Every day | ORAL | Status: DC
  Administered 2021-06-22 – 2021-06-25 (×3): 220 mg via ORAL
  Filled 2021-06-21 (×3): qty 1

## 2021-06-21 MED ORDER — ENOXAPARIN SODIUM 40 MG/0.4ML IJ SOSY
40.0000 mg | PREFILLED_SYRINGE | INTRAMUSCULAR | Status: DC
  Administered 2021-06-21 – 2021-06-25 (×5): 40 mg via SUBCUTANEOUS
  Filled 2021-06-21 (×5): qty 0.4

## 2021-06-21 MED ORDER — ZINC GLUCONATE 50 MG PO TABS: 50.0000 mg | ORAL_TABLET | Freq: Every day | ORAL | Status: DC

## 2021-06-21 MED ORDER — ASPIRIN EC 81 MG PO TBEC
81.0000 mg | DELAYED_RELEASE_TABLET | ORAL | Status: DC
  Administered 2021-06-25: 81 mg via ORAL
  Filled 2021-06-21 (×2): qty 1

## 2021-06-21 MED ORDER — POLYETHYLENE GLYCOL 3350 17 G PO PACK
17.0000 g | PACK | Freq: Every day | ORAL | Status: DC
  Administered 2021-06-23 – 2021-06-25 (×2): 17 g via ORAL
  Filled 2021-06-21 (×2): qty 1

## 2021-06-21 MED ORDER — ACETAMINOPHEN 650 MG RE SUPP: 650.0000 mg | Freq: Four times a day (QID) | RECTAL | Status: DC | PRN

## 2021-06-21 MED ORDER — AMIODARONE HCL 200 MG PO TABS
200.0000 mg | ORAL_TABLET | Freq: Every day | ORAL | Status: DC
  Administered 2021-06-21 – 2021-06-25 (×4): 200 mg via ORAL
  Filled 2021-06-21 (×4): qty 1

## 2021-06-21 MED ORDER — VANCOMYCIN HCL 1500 MG/300ML IV SOLN
1500.0000 mg | Freq: Once | INTRAVENOUS | Status: AC
  Administered 2021-06-21: 1500 mg via INTRAVENOUS
  Filled 2021-06-21: qty 300

## 2021-06-21 MED ORDER — URSODIOL 300 MG PO CAPS
600.0000 mg | ORAL_CAPSULE | Freq: Two times a day (BID) | ORAL | Status: DC
  Administered 2021-06-22 – 2021-06-23 (×2): 600 mg via ORAL
  Filled 2021-06-21 (×9): qty 2

## 2021-06-21 MED ORDER — CALCIUM GLUCONATE-NACL 1-0.675 GM/50ML-% IV SOLN
1.0000 g | Freq: Once | INTRAVENOUS | Status: AC
  Administered 2021-06-21: 1000 mg via INTRAVENOUS
  Filled 2021-06-21: qty 50

## 2021-06-21 MED ORDER — LACTATED RINGERS IV BOLUS
250.0000 mL | Freq: Once | INTRAVENOUS | Status: AC
  Administered 2021-06-21: 250 mL via INTRAVENOUS

## 2021-06-21 MED ORDER — ACETAMINOPHEN 325 MG PO TABS
650.0000 mg | ORAL_TABLET | Freq: Four times a day (QID) | ORAL | Status: DC | PRN
  Administered 2021-06-21: 650 mg via ORAL
  Filled 2021-06-21: qty 2

## 2021-06-21 MED ORDER — VANCOMYCIN HCL 1250 MG/250ML IV SOLN
1250.0000 mg | INTRAVENOUS | Status: DC
  Filled 2021-06-21: qty 250

## 2021-06-21 MED ORDER — PSEUDOEPHEDRINE HCL ER 120 MG PO TB12: 120.0000 mg | ORAL_TABLET | Freq: Two times a day (BID) | ORAL | Status: DC

## 2021-06-21 MED ORDER — RISAQUAD PO CAPS
1.0000 | ORAL_CAPSULE | Freq: Every day | ORAL | Status: DC
  Administered 2021-06-22 – 2021-06-25 (×3): 1 via ORAL
  Filled 2021-06-21 (×4): qty 1

## 2021-06-21 MED ORDER — LORATADINE 10 MG PO TABS
10.0000 mg | ORAL_TABLET | Freq: Every day | ORAL | Status: DC
  Administered 2021-06-23 – 2021-06-25 (×2): 10 mg via ORAL
  Filled 2021-06-21 (×2): qty 1

## 2021-06-21 MED ORDER — PROBIOTIC DAILY PO CAPS: ORAL_CAPSULE | Freq: Every day | ORAL | Status: DC

## 2021-06-21 MED ORDER — ALBUTEROL SULFATE (2.5 MG/3ML) 0.083% IN NEBU
2.5000 mg | INHALATION_SOLUTION | Freq: Four times a day (QID) | RESPIRATORY_TRACT | Status: DC | PRN
Start: 2021-06-21 — End: 2021-06-26

## 2021-06-21 MED ORDER — LACTATED RINGERS IV SOLN: INTRAVENOUS | Status: DC

## 2021-06-21 MED ORDER — SODIUM CHLORIDE 0.9 % IV SOLN
2.0000 g | Freq: Once | INTRAVENOUS | Status: AC
  Administered 2021-06-21: 2 g via INTRAVENOUS
  Filled 2021-06-21: qty 2

## 2021-06-21 MED ORDER — LACTATED RINGERS IV BOLUS (SEPSIS)
250.0000 mL | Freq: Once | INTRAVENOUS | Status: AC
  Administered 2021-06-21: 250 mL via INTRAVENOUS

## 2021-06-21 MED ORDER — SODIUM CHLORIDE 0.9% FLUSH
3.0000 mL | Freq: Two times a day (BID) | INTRAVENOUS | Status: DC
  Administered 2021-06-21 – 2021-06-25 (×6): 3 mL via INTRAVENOUS

## 2021-06-21 MED ORDER — NORMAL SALINE FLUSH 0.9 % IV SOLN: 5.0000 mL | Freq: Two times a day (BID) | INTRAVENOUS | Status: DC

## 2021-06-21 NOTE — Sepsis Progress Note (Signed)
[facility] is following this code sepsis ?

## 2021-06-21 NOTE — Sepsis Progress Note (Signed)
Notified bedside nurse of need to draw lactic acid.  

## 2021-06-21 NOTE — H&P (Signed)
History and Physical    William [name] [id number] DOB: Apr 01, 1928 DOA: 06/21/2021  Referring MD/NP/PA: Godfrey Pick, MD PCP: Haywood Pao, MD  Patient coming from: home(lives with wife) via EMS  Chief Complaint: breathing fast and elevated heart rate  I have personally briefly reviewed patient's old medical records in [place]   HPI: William [name] is a 87 y.o. 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  History is obtained from patient's wife as he is reported to be nonverbal since having COVID-19 approximate 2 years ago and bedbound.  Apparently the patient had been in his normal state of health yesterday and his nurse normally gets him up with [facility] lift for couple hours a day.  His wife notes that he has fragile skin, but no bedsores to her knowledge.  He has been eating regular diet with assistance.  She reports that he has a lot of phlegm, but has not been coughing it out and could have possibly gone down the wrong way.  His last good bowel movement was 2 to 3 days ago.  Due to increased work of breathing and his heart rates going up into the 110s when it normally stays around 80-90 and they called EMS to have the patient evaluated as he has had history of resistant bacteria in the past for which she is followed by ID patient has a DO NOT RESUSCITATE order on the side and did, but wife states that it he were to need intubation temporarily she would want it to be done.   ED Course: Upon admission into the emergency department patient was seen to be afebrile, pulse 49-99, respiration 28-33, and all other vital signs maintained labs significant for WBC 17.8,  hemoglobin 14.2, platelets 139, BUN 21, creatinine 1.11, glucose 191, troponin 22, BNP 208.9, and lactic acid 2.6.  Chest x-ray noted low lung volumes with similar streaky bibasilar opacities thought atelectasis versus aspiration and/or pneumonia.  Urinalysis positive for large leukocytes, many bacteria, 1-50 RBCs/hpf, and greater than 50 WBCs.Influenza and COVID-19 screening was negative.  Patient had been given 500 mL of normal saline IV fluids, cefepime, vancomycin, and azithromycin.  Review of Systems  Unable to perform ROS: Patient nonverbal (And has dementia)   Past Medical History:  Diagnosis Date   AAA (abdominal aortic aneurysm) ([place])    7/13 3.8cm   Arthritis    "joints" (01/11/2014)   Asthma    "seasonal; some foods"    Chronic systolic CHF (congestive heart failure) ([place])    COVID-19 12/15/2019   Dementia (HCC)    HLD (hyperlipidemia)    HOH (hard of hearing)    Lumbar vertebral fracture ([place])    L1- 04/11/2014    Parotitis 06/10/2019   Prostate cancer ([place])    S/P ICD (internal cardiac defibrillator) procedure, 01/11/14 removal of ERI gen and placement of Medtronic Evera XT VR & NEW Right ventricular lead Medtronic 01/12/2014   S/P TAVR (transcatheter aortic valve replacement)    Edwards Sapien 3 THV (size 29 mm, model # [id number], serial # [id number])   Severe aortic stenosis    Sleep apnea    "lost 60# & don't have it anymore" (01/11/2014)   Somnolence 08/04/2019   UTI (urinary tract  infection) 08/04/2019   Ventricular tachycardia [facility])     Past Surgical History:  Procedure Laterality Date   CARDIAC CATHETERIZATION  08/20/2004   noncritical CAD,mild global hypokinesis, EF 50%   CARDIAC DEFIBRILLATOR PLACEMENT  08/23/2004   Medtronic   CATARACT EXTRACTION W/ INTRAOCULAR LENS IMPLANT Right    COLECTOMY  1990's   CRYOABLATION N/A 02/24/2014   Procedure: CRYO ABLATION PROSTATE;  Surgeon: Ailene Rud, MD;  Location: [facility] ORS;  Service: Urology;  Laterality: N/A;   ERCP  N/A 01/29/2019   Procedure: ENDOSCOPIC RETROGRADE CHOLANGIOPANCREATOGRAPHY (ERCP);  Surgeon: Milus Banister, MD;  Location: [facility] ENDOSCOPY;  Service: Endoscopy;  Laterality: N/A;   HERNIA REPAIR     "abdomen; from colon OR"   ICD LEAD REMOVAL N/A 09/15/2018   Procedure: ICD LEAD REMOVAL EXTRACTION;  Surgeon: Evans Lance, MD;  Location: [facility] OR;  Service: Cardiovascular;  Laterality: N/A;  DR. BARTLE TO BACK UP   IMPLANTABLE CARDIOVERTER DEFIBRILLATOR (ICD) GENERATOR CHANGE N/A 01/11/2014   Procedure: ICD GENERATOR CHANGE;  Surgeon: Sanda Klein, MD;  Location: [place] CATH LAB;  Service: Cardiovascular;  Laterality: N/A;   IR CATHETER TUBE CHANGE  03/01/2019   IR CATHETER TUBE CHANGE  04/05/2019   IR CATHETER TUBE CHANGE  07/05/2019   IR CATHETER TUBE CHANGE  08/02/2019   IR CATHETER TUBE CHANGE  08/30/2019   IR CATHETER TUBE CHANGE  09/27/2019   IR CATHETER TUBE CHANGE  10/25/2019   IR CATHETER TUBE CHANGE  11/29/2019   IR EXCHANGE BILIARY DRAIN  12/18/2018   IR EXCHANGE BILIARY DRAIN  01/05/2019   IR EXCHANGE BILIARY DRAIN  01/25/2019   IR EXCHANGE BILIARY DRAIN  01/13/2020   IR EXCHANGE BILIARY DRAIN  02/28/2020   IR EXCHANGE BILIARY DRAIN  04/24/2020   IR EXCHANGE BILIARY DRAIN  07/03/2020   IR EXCHANGE BILIARY DRAIN  09/14/2020   IR EXCHANGE BILIARY DRAIN  11/27/2020   IR EXCHANGE BILIARY DRAIN  01/23/2021   IR EXCHANGE BILIARY DRAIN  03/30/2021   IR EXCHANGE BILIARY DRAIN  06/08/2021   IR PERC CHOLECYSTOSTOMY  11/22/2018   IR RADIOLOGIST EVAL & MGMT  02/26/2019   IR RADIOLOGIST EVAL & MGMT  03/17/2019   IR SINUS/FIST TUBE CHK-NON GI  01/28/2019   LEAD REVISION N/A 01/11/2014   Procedure: LEAD REVISION;  Surgeon: Sanda Klein, MD;  Location: [place] CATH LAB;  Service: Cardiovascular;  Laterality: N/A;   NM MYOCAR PERF WALL MOTION  01/29/2012   abnormal c/o infarct/scar,no ischemia present   PROSTATE BIOPSY N/A 11/22/2013   Procedure: PROSTATE BIOPSY AND ULTRASOUND;  Surgeon: Ailene Rud, MD;   Location: [facility];  Service: Urology;  Laterality: N/A;   REMOVAL OF STONES  01/29/2019   Procedure: REMOVAL OF STONES;  Surgeon: Milus Banister, MD;  Location: [facility] ENDOSCOPY;  Service: Endoscopy;;   RIGHT/LEFT HEART CATH AND CORONARY ANGIOGRAPHY N/A 07/06/2018   Procedure: RIGHT/LEFT HEART CATH AND CORONARY ANGIOGRAPHY;  Surgeon: Troy Sine, MD;  Location: [place] CV LAB;  Service: Cardiovascular;  Laterality: N/A;   SPHINCTEROTOMY  01/29/2019   Procedure: SPHINCTEROTOMY;  Surgeon: Milus Banister, MD;  Location: [facility] ENDOSCOPY;  Service: Endoscopy;;   TEE WITHOUT CARDIOVERSION N/A 08/18/2018   Procedure: TRANSESOPHAGEAL ECHOCARDIOGRAM (TEE);  Surgeon: Burnell Blanks, MD;  Location: [place];  Service: Open Heart Surgery;  Laterality: N/A;   TEE WITHOUT CARDIOVERSION N/A 09/08/2018   Procedure: TRANSESOPHAGEAL ECHOCARDIOGRAM (TEE);  Surgeon: Lelon Perla, MD;  Location: [facility] ENDOSCOPY;  Service: Cardiovascular;  Laterality: N/A;   TEE WITHOUT CARDIOVERSION N/A 09/15/2018   Procedure: TRANSESOPHAGEAL ECHOCARDIOGRAM (TEE);  Surgeon: Evans Lance, MD;  Location: [facility] OR;  Service: Cardiovascular;  Laterality: N/A;   TEE WITHOUT CARDIOVERSION N/A 11/26/2018   Procedure: TRANSESOPHAGEAL ECHOCARDIOGRAM (TEE);  Surgeon: Jerline Pain, MD;  Location: [facility] ENDOSCOPY;  Service: Cardiovascular;  Laterality: N/A;   TRANSCATHETER AORTIC VALVE REPLACEMENT, TRANSFEMORAL  08/18/2018   TRANSCATHETER AORTIC VALVE REPLACEMENT, TRANSFEMORAL N/A 08/18/2018   Procedure: TRANSCATHETER AORTIC VALVE REPLACEMENT, TRANSFEMORAL using an Edwards 58m Aortic Valve;  Surgeon: MBurnell Blanks MD;  [place]  Service: Open Heart Surgery;  Laterality: N/A;     reports that he has never smoked. He has never used smokeless tobacco. He reports that he does not currently use alcohol. He reports that he does not use drugs.  Allergies  Allergen Reactions   Crestor [Rosuvastatin] Other (See Comments)     Hurts muscles   Lipitor [Atorvastatin] Other (See Comments)    Hurts stomach   Shrimp [Shellfish Allergy] Other (See Comments)    On MAR    Family History  Problem Relation Age of Onset   Heart failure Mother        Died of "old age" at 94  Pneumonia Father     Prior to Admission medications   Medication Sig Start Date End Date Taking? Authorizing Provider  acetaminophen (TYLENOL) 500 MG tablet Take 500 mg by mouth every 6 (six) hours as needed (for pain).     [provider]  amiodarone (PACERONE) 200 MG tablet TAKE 1 TABLET(200 MG) BY MOUTH DAILY 05/08/21   Croitoru, MDani Gobble MD  amoxicillin (AMOXIL) 875 MG tablet Take 1 tablet (875 mg total) by mouth 2 (two) times daily. Patient not taking: Reported on 02/21/2021 12/25/20   RGayland Curry DO  aspirin EC 81 MG tablet Take 81 mg by mouth every Monday, Wednesday, and Friday.    [provider]  cholecalciferol (VITAMIN D) 1000 units tablet Take 2,000 Units by mouth at bedtime.    [provider]  Cranberry-Vit C-Lactobacillus (RA CRANBERRY SUPPLEMENTS PO) Take by mouth in the morning and at bedtime.     [provider]  loratadine (CLARITIN) 10 MG tablet Take 10 mg by mouth daily.    [provider]  Multiple Vitamins-Minerals (PRESERVISION AREDS 2 PO) Take by mouth. Take one tablet by mouth dailey     [provider]  polyethylene glycol (MIRALAX / GLYCOLAX) 17 g packet Take 17 g by mouth as needed.    [provider]  potassium chloride (KLOR-CON) 10 MEQ tablet Take 1 tablet (10 mEq total) by mouth daily. 01/09/21   Croitoru, Mihai, MD  Probiotic Product (PROBIOTIC DAILY PO) Take 1 capsule by mouth daily.    [provider]  Sodium Chloride Flush (NORMAL SALINE FLUSH) 0.9 % SOLN Use 5 mLs as directed every 12 hours (to flush drain) 05/22/21   BArdis Rowan PA-C  ursodiol (ACTIGALL) 300 MG capsule TAKE 2 CAPSULES(600 MG) BY MOUTH TWICE DAILY 07/17/20   JMilus Banister MD  zinc gluconate 50 MG tablet Take 50 mg by mouth daily.    [provider]    Physical Exam:  Constitutional: Elderly male who is nonverbal not readily following commands at this time Vitals:   06/21/21 1215 06/21/21 1245 06/21/21 1400 06/21/21 1745  BP: 120/67 115/69 105/69 101/71  Pulse: 94 93 83 100  Resp: (!) 32 (!)  28 (!) 27 (!) 31  Temp:      TempSrc:      SpO2: 98% 97% 97% 95%   Eyes: PERRL, lids and conjunctivae normal ENMT: Mucous membranes are moist. Posterior pharynx clear of any exudate or lesions.  Neck: normal, supple, no masses, no thyromegaly Respiratory: clear to auscultation bilaterally, no wheezing, no crackles. Normal respiratory effort. No accessory muscle use.  Cardiovascular: Irregular irregular with with positive murmur.  No extremity edema. 2+ pedal pulses. No carotid bruits.  Abdomen: Biliary drain present of the the right side of the abdomen. Musculoskeletal: no clubbing / cyanosis.  Contracture noted of the right hand with decreased range of motion. Skin: Bruising noted mild bilateral upper remedies.  No significant erythema or significant wounds appreciated. Neurologic: Patient appears able to move all extremities. Psychiatric: Unable to assess as patient is nonverbal    Labs on Admission: I have personally reviewed following labs and imaging studies  CBC: Recent Labs  Lab 06/21/21 1107 06/21/21 1113  WBC 17.8*  --   NEUTROABS 15.7*  --   HGB 14.2 13.6  HCT 41.6 40.0  MCV 99.8  --   PLT 139*  --     Basic Metabolic Panel: Recent Labs  Lab 06/21/21 1107 06/21/21 1113  NA 135 137  K 4.3 4.3  CL 103 104  CO2 22  --   GLUCOSE 191* 191*  BUN 21 22  CREATININE 1.11 1.00  CALCIUM 8.6*  --   MG 2.2  --     GFR: CrCl cannot be calculated (Unknown ideal weight.). Liver Function Tests: Recent Labs  Lab 06/21/21 1107  AST 17  ALT 10  ALKPHOS 106  BILITOT 2.2*  PROT 7.0  ALBUMIN 3.0*    No results for  input(s): LIPASE, AMYLASE in the last 168 hours. No results for input(s): AMMONIA in the last 168 hours. Coagulation Profile: No results for input(s): INR, PROTIME in the last 168 hours. Cardiac Enzymes: No results for input(s): CKTOTAL, CKMB, CKMBINDEX, TROPONINI in the last 168 hours. BNP (last 3 results) No results for input(s): PROBNP in the last 8760 hours. HbA1C: No results for input(s): HGBA1C in the last 72 hours. CBG: No results for input(s): GLUCAP in the last 168 hours. Lipid Profile: No results for input(s): CHOL, HDL, LDLCALC, TRIG, CHOLHDL, LDLDIRECT in the last 72 hours. Thyroid Function Tests: Recent Labs    06/21/21 1100  TSH 2.773    Anemia Panel: No results for input(s): VITAMINB12, FOLATE, FERRITIN, TIBC, IRON, RETICCTPCT in the last 72 hours. Urine analysis:    Component Value Date/Time   COLORURINE AMBER (A) 06/21/2021 1127   APPEARANCEUR CLOUDY (A) 06/21/2021 1127   LABSPEC 1.024 06/21/2021 1127   PHURINE 5.0 06/21/2021 1127   GLUCOSEU NEGATIVE 06/21/2021 1127   HGBUR SMALL (A) 06/21/2021 1127   BILIRUBINUR NEGATIVE 06/21/2021 1127   BILIRUBINUR Negative 07/29/2019 0927   KETONESUR NEGATIVE 06/21/2021 1127   PROTEINUR 30 (A) 06/21/2021 1127   UROBILINOGEN negative (A) 07/29/2019 0927   NITRITE NEGATIVE 06/21/2021 1127   LEUKOCYTESUR LARGE (A) 06/21/2021 1127   Sepsis Labs: Recent Results (from the past 240 hour(s))  Resp Panel by RT-PCR (Flu A&B, Covid) Nasopharyngeal Swab     Status: None   Collection Time: 06/21/21 10:07 AM   Specimen: Nasopharyngeal Swab; Nasopharyngeal(NP) swabs in vial transport medium  Result Value Ref Range Status   SARS Coronavirus 2 by RT PCR NEGATIVE NEGATIVE Final    Comment: (NOTE) SARS-CoV-2 target nucleic acids  are NOT DETECTED.  The SARS-CoV-2 RNA is generally detectable in upper respiratory specimens during the acute phase of infection. The lowest concentration of SARS-CoV-2 viral copies this assay can detect  is 138 copies/mL. A negative result does not preclude SARS-Cov-2 infection and should not be used as the sole basis for treatment or other patient management decisions. A negative result may occur with  improper specimen collection/handling, submission of specimen other than nasopharyngeal swab, presence of viral mutation(s) within the areas targeted by this assay, and inadequate number of viral copies(<138 copies/mL). A negative result must be combined with clinical observations, patient history, and epidemiological information. The expected result is Negative.  Fact Sheet for Patients:  EntrepreneurPulse.com.au  Fact Sheet for Healthcare Providers:  IncredibleEmployment.be  This test is no t yet approved or cleared by the [place] FDA and  has been authorized for detection and/or diagnosis of SARS-CoV-2 by FDA under an Emergency Use Authorization (EUA). This EUA will remain  in effect (meaning this test can be used) for the duration of the COVID-19 declaration under Section 564(b)(1) of the Act, 21 U.S.C.section 360bbb-3(b)(1), unless the authorization is terminated  or revoked sooner.       Influenza A by PCR NEGATIVE NEGATIVE Final   Influenza B by PCR NEGATIVE NEGATIVE Final    Comment: (NOTE) The Xpert Xpress SARS-CoV-2/FLU/RSV plus assay is intended as an aid in the diagnosis of influenza from Nasopharyngeal swab specimens and should not be used as a sole basis for treatment. Nasal washings and aspirates are unacceptable for Xpert Xpress SARS-CoV-2/FLU/RSV testing.  Fact Sheet for Patients: EntrepreneurPulse.com.au  Fact Sheet for Healthcare Providers: IncredibleEmployment.be  This test is not yet approved or cleared by the [place] FDA and has been authorized for detection and/or diagnosis of SARS-CoV-2 by FDA under an Emergency Use Authorization (EUA). This EUA will remain in effect  (meaning this test can be used) for the duration of the COVID-19 declaration under Section 564(b)(1) of the Act, 21 U.S.C. section 360bbb-3(b)(1), unless the authorization is terminated or revoked.  Performed at [facility], [place] [address] [place], [place] [address]      Radiological Exams on Admission: CT HEAD WO CONTRAST (5MM)  Result Date: 06/21/2021 CLINICAL DATA:  Altered mental status. EXAM: CT HEAD WITHOUT CONTRAST TECHNIQUE: Contiguous axial images were obtained from the base of the skull through the vertex without intravenous contrast. COMPARISON:  February 02, 2019. FINDINGS: Brain: Mild diffuse cortical atrophy. Mild chronic ischemic white matter disease. No mass effect or midline shift is noted. Ventricular size is within normal limits. There is no evidence of mass lesion, hemorrhage or acute infarction. 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 Skull: Normal. Negative for fracture or focal lesion. Sinuses/Orbits: No acute finding. Other: None. IMPRESSION: No acute intracranial abnormality seen. 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 Electronically Signed   By: Marijo Conception M.D.   On: 06/21/2021 13:49   DG Chest Port 1 View  Result Date: 06/21/2021 CLINICAL DATA:  Questionable sepsis - evaluate for abnormality EXAM: PORTABLE CHEST 1 VIEW COMPARISON:  November 30, 2020. FINDINGS: Low lung volumes. Elevated left hemidiaphragm. Streaky bibasilar opacities. No visible pleural effusions or pneumothorax. The patient's chin obscures the medial lung apices. No large pleural effusions. Chronically enlarged enlarged cardiac silhouette, partially obscured. Prior TAVR. Calcific atherosclerosis of the aorta. Partially imaged pigtail drain  in the right upper quadrant. IMPRESSION: Low lung volumes with elevated left hemidiaphragm and  similar streaky bibasilar opacities, most likely atelectasis. Aspiration and/or pneumonia is not excluded. Electronically Signed   By: Margaretha Sheffield M.D.   On: 06/21/2021 10:47    EKG: Independently reviewed.  Atrial fibrillation at 98 bpm with premature ventricular complexes and QTC 495.  Assessment/Plan Sepsis secondary to urinary tract infection and/or possible  pneumonia: Patient presented after being found to be tachypneic with WBC 17.8 and initial lactic acid 2.6.  Urinalysis was positive for large leukocytes, many bacteria, and greater than 50 WBCs.  Chest x-ray noted low lung volumes with streaky bibasilar opacities thought to be most likely atelectasis, but there was concern for possible aspiration or pneumonia.  His O2 saturations were otherwise maintained on room air.  Patient had been given small IV fluid bolus, vancomycin, cefepime, and azithromycin. -Admit to a medical telemetry bed -Follow-up blood and urine cultures -Check procalcitonin -Continue empiric antibiotics and de-escalate when medically appropriate -Trend lactic acid levels -Recheck CBC in a.m. -Speech therapy consulted for the possibility of aspiration -ID consulted, will follow-up for further recommendation  Heart failure with reduced ejection fraction: Chronic.  Patient does not appear grossly fluid overloaded at this time.  BNP was 208.9 which is lower than previous values.  Last echocardiogram noted EF of 25 to 30% with left ventricular diffuse hypokinesis in 2020. -Strict I&O's -Daily weights  Atrial fibrillationHistory of ventricular tachycardia: Patient appears to be in atrial fibrillation at this time heart rates relatively controlled.  Previously had AICD in place, but had to be explanted in December 2019 after patient developed endocarditis. -Continue amiodarone  Elevated troponin: Chronic.  High-sensitivity troponin 22.  Suspect secondary to demand. -Continue to monitor  Hyperbilirubinemia: Acute.   Total bilirubin mildly elevated 2.2. -Check fractionated bilirubin in a.m.  History of chronic cholecystitis history of recurrent cholangitis: Patient has a cholecystectomy tube in place as he was previously noted not to be a surgical candidate due to medical comorbidities. -Continue to monitor  History of COVID: Patient had been positive for COVID-19 and 11/2020, but was asymptomatic treated with remdesivir.  COVID-19 screening was negative today.  History of dementia debility: Patient is non ambulatory and nonverbal at baseline since having COVID back in 2020.  S/p TAVR in 2019  History of prostate cancer status post cryotherapy   DVT prophylaxis: Lovenox Code Status: Partial code okay for intubation and BiPAP. Family Communication: Wife updated at bedside Disposition Plan: To be determined Consults called: ID Admission status: Inpatient require more than 2 midnight stay for need of IV antibiotics and  Norval Morton MD [facility]   If 7PM-7AM, please contact night-coverage   06/21/2021, 7:02 PM

## 2021-06-21 NOTE — Progress Notes (Signed)
Pharmacy Antibiotic Note  William [name] is a 87 y.o. male admitted on 06/21/2021 presenting with AMS and tachypnea, concern for sepsis, possibly pna source. Pharmacy has been consulted for cefepime and vancomycin dosing.  Plan: Vancomycin 1500 mg IV x 1, then 1250 mg IV q 24h (eAUC 475, Goal AUC 400-550, SCr 1) Add MRSA PCR Cefepime 2g IV every 12 hours Monitor renal function, Cx and clinical progression to narrow Vancomycin levels as needed     Temp (24hrs), Avg:98.6 F (37 C), Min:98.6 F (37 C), Max:98.6 F (37 C)  Recent Labs  Lab 06/21/21 0943 06/21/21 1107 06/21/21 1113  WBC  --  17.8*  --   CREATININE  --  1.11 1.00  LATICACIDVEN 2.3*  --   --     CrCl cannot be calculated (Unknown ideal weight.).    Allergies  Allergen Reactions   Crestor [Rosuvastatin] Other (See Comments)    Hurts muscles   Lipitor [Atorvastatin] Other (See Comments)    Hurts stomach   Shrimp [Shellfish Allergy] Other (See Comments)    On MAR    Bertis Ruddy, PharmD Clinical Pharmacist ED Pharmacist Phone # [phone] 06/21/2021 12:32 PM

## 2021-06-21 NOTE — ED Provider Notes (Signed)
[facility] EMERGENCY DEPARTMENT Provider Note   CSN: [id number] Arrival date & time: 06/21/21  [id number]     History Chief Complaint  Patient presents with   Altered Mental Status    William [name] is a 87 y.o. male.  HPI Patient has a history of dementia.  He is nonverbal and bedbound at baseline, but will occasionally curse when working with physical therapy.  He lives at home with his wife.  He has daily nursing care.  Patient's wife states that his baseline interactions are minimal but that he does seem to recognize her and will make eye contact.  History is provided by his wife. This morning, his nurse checked his vital signs and found him to be tachycardic and tachypneic.  For this reason, EMS was called.  EMS reported low-grade fever on transit.  Patient's wife states that he was seeming normal yesterday.  He ate all of his dinner.  He has a history of tachyarrhythmias.  He previously had a defibrillator which was removed.  He is currently on amiodarone.  He has not gotten his dose today.  In the past, when his heart rate got fast, they would give him an extra 100 mg dose.  Per chart review, he was last admitted in February.  History is notable for CHF, V. 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  His biliary drain remains in place.    Past Medical History:  Diagnosis Date   AAA (abdominal aortic aneurysm) ([place])    7/13 3.8cm   Arthritis    "joints" (01/11/2014)   Asthma    "seasonal; some foods"    Chronic systolic CHF (congestive heart failure) ([place])    COVID-19 12/15/2019   Dementia ([place])    HLD (hyperlipidemia)    HOH (hard of hearing)    Lumbar vertebral fracture ([place])    L1- 04/11/2014    Parotitis 06/10/2019   Prostate cancer ([place])    S/P ICD (internal cardiac defibrillator) procedure, 01/11/14 removal of ERI gen and placement of  Medtronic Evera XT VR & NEW Right ventricular lead Medtronic 01/12/2014   S/P TAVR (transcatheter aortic valve replacement)    Edwards Sapien 3 THV (size 29 mm, model # [id number], serial # [id number])   Severe aortic stenosis    Sleep apnea    "lost 60# & don't have it anymore" (01/11/2014)   Somnolence 08/04/2019   UTI (urinary tract infection) 08/04/2019   Ventricular tachycardia ([place])     Patient Active Problem List   Diagnosis Date Noted   Sepsis ([place]) 06/21/2021   Acute cystitis with hematuria 12/01/2020   COVID-19 12/15/2019   Tachycardia 08/18/2019   PAC (premature atrial contraction) 08/18/2019   UTI (urinary tract infection) 08/04/2019   Somnolence 08/04/2019   Parotitis 06/10/2019   Hypotension 05/22/2019   Chronic cholecystitis due to cholelithiasis with choledocholithiasis 02/18/2019   Frail elderly 02/18/2019   Debilitated patient 02/18/2019   History of ventricular tachycardia 02/18/2019   ACP (advance care planning) 02/18/2019   Adult failure to thrive    DNR (do not resuscitate)    Choledocholithiasis    Preoperative cardiovascular examination    Palliative care by specialist    Dementia without behavioral disturbance ([place]) 01/26/2019   Transaminitis    Biliary sepsis 01/24/2019   Hyperglycemia [id number]   Acute metabolic encephalopathy 123XX123   LFTs abnormal    Constipation  01/08/2019   Acute cholecystitis    Cholangitis 11/25/2018   Bacteremia due to Enterobacter species 11/24/2018   Enterococcus faecalis infection 11/24/2018   Pressure ulcer 11/22/2018   Coronary artery disease involving native coronary artery of native heart without angina pectoris 09/22/2018   Closed fracture of lower end of right ulna 09/22/2018   Acute bacterial endocarditis    Infected defibrillator ([place])    Bacteremia due to Klebsiella pneumoniae    S/P TAVR (transcatheter aortic valve replacement) 08/18/2018   Ventricular tachycardia (HCC)    Dementia (HCC)    Chronic  systolic CHF (congestive heart failure) ([place]) 08/07/2018   Severe aortic stenosis    Prostate cancer ([place]) 01/20/2014   S/P ICD (internal cardiac defibrillator) procedure 01/12/2014   Nonischemic cardiomyopathy ([place]) 05/28/2013   Hyperlipidemia 05/28/2013   AAA (abdominal aortic aneurysm) ([place]) 05/28/2013    Past Surgical History:  Procedure Laterality Date   CARDIAC CATHETERIZATION  08/20/2004   noncritical CAD,mild global hypokinesis, EF 50%   CARDIAC DEFIBRILLATOR PLACEMENT  08/23/2004   Medtronic   CATARACT EXTRACTION W/ INTRAOCULAR LENS IMPLANT Right    COLECTOMY  1990's   CRYOABLATION N/A 02/24/2014   Procedure: CRYO ABLATION PROSTATE;  Surgeon: Ailene Rud, MD;  Location: [facility] ORS;  Service: Urology;  Laterality: N/A;   ERCP N/A 01/29/2019   Procedure: ENDOSCOPIC RETROGRADE CHOLANGIOPANCREATOGRAPHY (ERCP);  Surgeon: Milus Banister, MD;  Location: [facility] ENDOSCOPY;  Service: Endoscopy;  Laterality: N/A;   HERNIA REPAIR     "abdomen; from colon OR"   ICD LEAD REMOVAL N/A 09/15/2018   Procedure: ICD LEAD REMOVAL EXTRACTION;  Surgeon: Evans Lance, MD;  Location: [facility];  Service: Cardiovascular;  Laterality: N/A;  DR. BARTLE TO BACK UP   IMPLANTABLE CARDIOVERTER DEFIBRILLATOR (ICD) GENERATOR CHANGE N/A 01/11/2014   Procedure: ICD GENERATOR CHANGE;  Surgeon: Sanda Klein, MD;  Location: [place] CATH LAB;  Service: Cardiovascular;  Laterality: N/A;   IR CATHETER TUBE CHANGE  03/01/2019   IR CATHETER TUBE CHANGE  04/05/2019   IR CATHETER TUBE CHANGE  07/05/2019   IR CATHETER TUBE CHANGE  08/02/2019   IR CATHETER TUBE CHANGE  08/30/2019   IR CATHETER TUBE CHANGE  09/27/2019   IR CATHETER TUBE CHANGE  10/25/2019   IR CATHETER TUBE CHANGE  11/29/2019   IR EXCHANGE BILIARY DRAIN  12/18/2018   IR EXCHANGE BILIARY DRAIN  01/05/2019   IR EXCHANGE BILIARY DRAIN  01/25/2019   IR EXCHANGE BILIARY DRAIN  01/13/2020   IR EXCHANGE BILIARY DRAIN  02/28/2020   IR EXCHANGE BILIARY DRAIN  04/24/2020   IR  EXCHANGE BILIARY DRAIN  07/03/2020   IR EXCHANGE BILIARY DRAIN  09/14/2020   IR EXCHANGE BILIARY DRAIN  11/27/2020   IR EXCHANGE BILIARY DRAIN  01/23/2021   IR EXCHANGE BILIARY DRAIN  03/30/2021   IR EXCHANGE BILIARY DRAIN  06/08/2021   IR PERC CHOLECYSTOSTOMY  11/22/2018   IR RADIOLOGIST EVAL & MGMT  02/26/2019   IR RADIOLOGIST EVAL & MGMT  03/17/2019   IR SINUS/FIST TUBE CHK-NON GI  01/28/2019   LEAD REVISION N/A 01/11/2014   Procedure: LEAD REVISION;  Surgeon: Sanda Klein, MD;  Location: [place] CATH LAB;  Service: Cardiovascular;  Laterality: N/A;   NM MYOCAR PERF WALL MOTION  01/29/2012   abnormal c/o infarct/scar,no ischemia present   PROSTATE BIOPSY N/A 11/22/2013   Procedure: PROSTATE BIOPSY AND ULTRASOUND;  Surgeon: Ailene Rud, MD;  [facility]: [facility]S;  Service: Urology;  Laterality: N/A;   REMOVAL  OF STONES  01/29/2019   Procedure: REMOVAL OF STONES;  Surgeon: Milus Banister, MD;  Location: [facility] ENDOSCOPY;  Service: Endoscopy;;   RIGHT/LEFT HEART CATH AND CORONARY ANGIOGRAPHY N/A 07/06/2018   Procedure: RIGHT/LEFT HEART CATH AND CORONARY ANGIOGRAPHY;  Surgeon: Troy Sine, MD;  Location: [place] CV LAB;  Service: Cardiovascular;  Laterality: N/A;   SPHINCTEROTOMY  01/29/2019   Procedure: SPHINCTEROTOMY;  Surgeon: Milus Banister, MD;  Location: [facility] ENDOSCOPY;  Service: Endoscopy;;   TEE WITHOUT CARDIOVERSION N/A 08/18/2018   Procedure: TRANSESOPHAGEAL ECHOCARDIOGRAM (TEE);  Surgeon: Burnell Blanks, MD;  Location: [place];  Service: Open Heart Surgery;  Laterality: N/A;   TEE WITHOUT CARDIOVERSION N/A 09/08/2018   Procedure: TRANSESOPHAGEAL ECHOCARDIOGRAM (TEE);  Surgeon: Lelon Perla, MD;  Location: [facility] ENDOSCOPY;  Service: Cardiovascular;  Laterality: N/A;   TEE WITHOUT CARDIOVERSION N/A 09/15/2018   Procedure: TRANSESOPHAGEAL ECHOCARDIOGRAM (TEE);  Surgeon: Evans Lance, MD;  Location: [facility] OR;  Service: Cardiovascular;  Laterality: N/A;   TEE WITHOUT CARDIOVERSION N/A  11/26/2018   Procedure: TRANSESOPHAGEAL ECHOCARDIOGRAM (TEE);  Surgeon: Jerline Pain, MD;  Location: [facility] ENDOSCOPY;  Service: Cardiovascular;  Laterality: N/A;   TRANSCATHETER AORTIC VALVE REPLACEMENT, TRANSFEMORAL  08/18/2018   TRANSCATHETER AORTIC VALVE REPLACEMENT, TRANSFEMORAL N/A 08/18/2018   Procedure: TRANSCATHETER AORTIC VALVE REPLACEMENT, TRANSFEMORAL using an Edwards 77m Aortic Valve;  Surgeon: MBurnell Blanks MD;  Location: [place]  Service: Open Heart Surgery;  Laterality: N/A;       Family History  Problem Relation Age of Onset   Heart failure Mother        Died of "old age" at 9108  Pneumonia Father     Social History   Tobacco Use   Smoking status: Never   Smokeless tobacco: Never  Vaping Use   Vaping Use: Never used  Substance Use Topics   Alcohol use: Not Currently    Alcohol/week: 0.0 standard drinks    Comment: 01/11/2014 "drink 1/2 of a beer twice per month    Drug use: No    Home Medications Prior to Admission medications   Medication Sig Start Date End Date Taking? Authorizing Provider  amiodarone (PACERONE) 200 MG tablet TAKE 1 TABLET(200 MG) BY MOUTH DAILY Patient taking differently: Take 200 mg by mouth daily. 05/08/21  Yes Croitoru, Mihai, MD  aspirin EC 81 MG tablet Take 81 mg by mouth every Monday, Wednesday, and Friday.   Yes [provider]  cholecalciferol (VITAMIN D) 1000 units tablet Take 2,000 Units by mouth at bedtime.   Yes [provider]  Cranberry-Vit C-Lactobacillus (RA CRANBERRY SUPPLEMENTS PO) Take by mouth in the morning and at bedtime.    Yes [provider]  fexofenadine (ALLEGRA) 60 MG tablet Take 60 mg by mouth daily.   Yes [provider]  Multiple Vitamins-Minerals (PRESERVISION AREDS 2 PO) Take by mouth. Take one tablet by mouth dailey    Yes [provider]  polyethylene glycol (MIRALAX / GLYCOLAX) 17 g packet Take 17 g by mouth as needed.   Yes [provider]   Probiotic Product (PROBIOTIC DAILY PO) Take 1 capsule by mouth daily.   Yes [provider]  ursodiol (ACTIGALL) 300 MG capsule TAKE 2 CAPSULES(600 MG) BY MOUTH TWICE DAILY Patient taking differently: Take 600 mg by mouth 2 (two) times daily. 07/17/20  Yes JMilus Banister MD  zinc gluconate 50 MG tablet Take 50 mg by mouth daily.   Yes [provider]  amoxicillin (  AMOXIL) 875 MG tablet Take 1 tablet (875 mg total) by mouth 2 (two) times daily. Patient not taking: No sig reported 12/25/20   Reed, Tiffany L, DO  potassium chloride (KLOR-CON) 10 MEQ tablet Take 1 tablet (10 mEq total) by mouth daily. Patient not taking: Reported on 06/21/2021 01/09/21   Croitoru, Dani Gobble, MD  Sodium Chloride Flush (NORMAL SALINE FLUSH) 0.9 % SOLN Use 5 mLs as directed every 12 hours (to flush drain) 05/22/21   Ardis Rowan, PA-C    Allergies    Crestor [rosuvastatin], Lipitor [atorvastatin], and Shrimp [shellfish allergy]  Review of Systems   Review of Systems  Unable to perform ROS: Patient nonverbal   Physical Exam Updated Vital Signs BP 101/71   Pulse 100   Temp 98.6 F (37 C) (Oral)   Resp (!) 31   SpO2 95%   Physical Exam Vitals reviewed.  Constitutional:      General: He is sleeping.     Appearance: He is ill-appearing. He is not diaphoretic.  HENT:     Head: Normocephalic and atraumatic.     Right Ear: External ear normal.     Left Ear: External ear normal.     Nose: Nose normal.  Cardiovascular:     Rate and Rhythm: Tachycardia present. Rhythm irregular.  Pulmonary:     Effort: Tachypnea present.     Breath sounds: Transmitted upper airway sounds present. Decreased breath sounds present.  Abdominal:     Palpations: Abdomen is soft.     Tenderness: There is no abdominal tenderness.  Musculoskeletal:        General: No deformity.     Cervical back: Neck supple.     Right lower leg: No edema.     Left lower leg: No edema.  Skin:    General: Skin is warm and  dry.  Neurological:     Mental Status: Mental status is at baseline. He is lethargic.     GCS: GCS eye subscore is 3. GCS verbal subscore is 1. GCS motor subscore is 5.    ED Results / Procedures / Treatments   Labs (all labs ordered are listed, but only abnormal results are displayed) Labs Reviewed  LACTIC ACID, PLASMA - Abnormal; Notable for the following components:      Result Value   Lactic Acid, Venous 2.3 (*)    All other components within normal limits  LACTIC ACID, PLASMA - Abnormal; Notable for the following components:   Lactic Acid, Venous 2.3 (*)    All other components within normal limits  URINALYSIS, ROUTINE W REFLEX MICROSCOPIC - Abnormal; Notable for the following components:   Color, Urine AMBER (*)    APPearance CLOUDY (*)    Hgb urine dipstick SMALL (*)    Protein, ur 30 (*)    Leukocytes,Ua LARGE (*)    WBC, UA >50 (*)    Bacteria, UA MANY (*)    All other components within normal limits  COMPREHENSIVE METABOLIC PANEL - Abnormal; Notable for the following components:   Glucose, Bld 191 (*)    Calcium 8.6 (*)    Albumin 3.0 (*)    Total Bilirubin 2.2 (*)    All other components within normal limits  CBC WITH DIFFERENTIAL/PLATELET - Abnormal; Notable for the following components:   WBC 17.8 (*)    RBC 4.17 (*)    MCH 34.1 (*)    Platelets 139 (*)    Neutro Abs 15.7 (*)    Lymphs Abs 0.6 (*)  Monocytes Absolute 1.2 (*)    Abs Immature Granulocytes 0.11 (*)    All other components within normal limits  BRAIN NATRIURETIC PEPTIDE - Abnormal; Notable for the following components:   B Natriuretic Peptide 208.9 (*)    All other components within normal limits  I-STAT CHEM 8, ED - Abnormal; Notable for the following components:   Glucose, Bld 191 (*)    Calcium, Ion 1.13 (*)    All other components within normal limits  TROPONIN I (HIGH SENSITIVITY) - Abnormal; Notable for the following components:   Troponin I (High Sensitivity) 22 (*)    All other  components within normal limits  RESP PANEL BY RT-PCR (FLU A&B, COVID) ARPGX2  URINE CULTURE  CULTURE, BLOOD (ROUTINE X 2)  CULTURE, BLOOD (ROUTINE X 2)  MRSA NEXT GEN BY PCR, NASAL  TSH  MAGNESIUM  CBC WITH DIFFERENTIAL/PLATELET  PROCALCITONIN  CBC  BASIC METABOLIC PANEL    EKG EKG Interpretation  Date/Time:  Thursday June 21 2021 09:59:03 EDT Ventricular Rate:  98 PR Interval:    QRS Duration: 144 QT Interval:  387 QTC Calculation: 495 R Axis:   -71 Text Interpretation: Atrial fibrillation Ventricular premature complex IVCD, consider atypical RBBB Confirmed by Godfrey Pick [phone]) on 06/21/2021 11:33:29 AM  Radiology CT HEAD WO CONTRAST (5MM)  Result Date: 06/21/2021 CLINICAL DATA:  Altered mental status. EXAM: CT HEAD WITHOUT CONTRAST TECHNIQUE: Contiguous axial images were obtained from the base of the skull through the vertex without intravenous contrast. COMPARISON:  February 02, 2019. FINDINGS: Brain: Mild diffuse cortical atrophy. Mild chronic ischemic white matter disease. No mass effect or midline shift is noted. Ventricular size is within normal limits. There is no evidence of mass lesion, hemorrhage or acute infarction. 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 Skull: Normal. Negative for fracture or focal lesion. Sinuses/Orbits: No acute finding. Other: None. IMPRESSION: No acute intracranial abnormality seen. 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 Electronically Signed   By: Marijo Conception M.D.   On: 06/21/2021 13:49   DG Chest Port 1 View  Result Date: 06/21/2021 CLINICAL DATA:  Questionable sepsis - evaluate for abnormality EXAM: PORTABLE CHEST 1 VIEW COMPARISON:  November 30, 2020. FINDINGS: Low lung volumes. Elevated left hemidiaphragm. Streaky bibasilar opacities. No visible pleural effusions or pneumothorax.  The patient's chin obscures the medial lung apices. No large pleural effusions. Chronically enlarged enlarged cardiac silhouette, partially obscured. Prior TAVR. Calcific atherosclerosis of the aorta. Partially imaged pigtail drain in the right upper quadrant. IMPRESSION: Low lung volumes with elevated left hemidiaphragm and similar streaky bibasilar opacities, most likely atelectasis. Aspiration and/or pneumonia is not excluded. Electronically Signed   By: Margaretha Sheffield M.D.   On: 06/21/2021 10:47    Procedures Procedures   Medications Ordered in ED Medications  vancomycin (VANCOREADY) IVPB 1250 mg/250 mL (has no administration in time range)  ceFEPIme (MAXIPIME) 2 g in sodium chloride 0.9 % 100 mL IVPB (has no administration in time range)  lactated ringers infusion ( Intravenous New Bag/Given 06/21/21 1441)  Probiotic Daily CAPS ( Oral Not Given 06/21/21 1627)  enoxaparin (LOVENOX) injection 40 mg (has no administration in time range)  sodium chloride flush (NS) 0.9 % injection 3 mL (3 mLs Intravenous Given 06/21/21 1451)  acetaminophen (TYLENOL) tablet 650 mg (has no administration in time range)    Or  acetaminophen (TYLENOL) suppository  650 mg (has no administration in time range)  albuterol (PROVENTIL) (2.5 MG/3ML) 0.083% nebulizer solution 2.5 mg (has no administration in time range)  calcium gluconate 1 g/ 50 mL sodium chloride IVPB (1,000 mg Intravenous New Bag/Given 06/21/21 1807)  lactated ringers bolus 250 mL (0 mLs Intravenous Stopped 06/21/21 1221)  vancomycin (VANCOREADY) IVPB 1500 mg/300 mL (0 mg Intravenous Stopped 06/21/21 1441)  ceFEPIme (MAXIPIME) 2 g in sodium chloride 0.9 % 100 mL IVPB (0 g Intravenous Stopped 06/21/21 1221)  azithromycin (ZITHROMAX) 500 mg in sodium chloride 0.9 % 250 mL IVPB (0 mg Intravenous Stopped 06/21/21 1328)  lactated ringers bolus 250 mL (0 mLs Intravenous Stopped 06/21/21 1221)    ED Course  I have reviewed the triage vital signs and the nursing  notes.  Pertinent labs & imaging results that were available during my care of the patient were reviewed by me and considered in my medical decision making (see chart for details).    MDM Rules/Calculators/A&P                         CRITICAL CARE Performed by: Godfrey Pick   Total critical care time: 40 minutes  Critical care time was exclusive of separately billable procedures and treating other patients.  Critical care was necessary to treat or prevent imminent or life-threatening deterioration.  Critical care was time spent personally by me on the following activities: development of treatment plan with patient and/or surrogate as well as nursing, discussions with consultants, evaluation of patient's response to treatment, examination of patient, obtaining history from patient or surrogate, ordering and performing treatments and interventions, ordering and review of laboratory studies, ordering and review of radiographic studies, pulse oximetry and re-evaluation of patient's condition.   Patient is a 87 year old male presenting for tachycardia and tachypnea.  He lives at home with his wife.  He is nonverbal and bedbound at baseline.  There was a report of a low-grade fever by EMS.  However, upon arrival, patient is afebrile.  He is tachypneic with a respiratory rate in the 30s.  He has coarse breath sounds on lung auscultation.  Heart rate is in the range of 100.  Gentle IV hydration was started.  EKG shows atrial fibrillation with no evidence of ischemia.  Chest x-ray showed concern of pneumonia.  Initial lactic acid was elevated at 2.3.  Patient has a history of infections with drug-resistant organisms.  Broad-spectrum antibiotics were initiated.  Subsequent lab work showed leukocytosis and evidence of urinary infection.  I spoke at length with the patient's wife.  Patient does have a DNR form.  Initially, patient's wife stated that she would not want him on a ventilator if his breathing  worsened.  She subsequently stated that, if intubation was a temporary measure, then ventilator support would be okay.  Although the patient has minimal interactions at baseline, she does state that she wants him to have every day alive as possible.  Patient was admitted for further management.  Final Clinical Impression(s) / ED Diagnoses Final diagnoses:  Tachypnea  Urinary tract infection with hematuria, site unspecified  Lactic acidosis    Rx / DC Orders ED Discharge Orders     None        Godfrey Pick, MD 06/21/21 [phone]

## 2021-06-21 NOTE — ED Triage Notes (Signed)
Patient BIB [facility] from home. Per EMS called out for increased altered mental status and increased respiratory rate. Pt non verbal at baseline. HX afib.

## 2021-06-22 DIAGNOSIS — F0390 Unspecified dementia without behavioral disturbance: Secondary | ICD-10-CM | POA: Diagnosis not present

## 2021-06-22 DIAGNOSIS — N390 Urinary tract infection, site not specified: Secondary | ICD-10-CM | POA: Diagnosis not present

## 2021-06-22 DIAGNOSIS — R5381 Other malaise: Secondary | ICD-10-CM | POA: Diagnosis not present

## 2021-06-22 DIAGNOSIS — Z711 Person with feared health complaint in whom no diagnosis is made: Secondary | ICD-10-CM

## 2021-06-22 DIAGNOSIS — J189 Pneumonia, unspecified organism: Secondary | ICD-10-CM | POA: Diagnosis not present

## 2021-06-22 DIAGNOSIS — A419 Sepsis, unspecified organism: Secondary | ICD-10-CM | POA: Diagnosis not present

## 2021-06-22 DIAGNOSIS — Z515 Encounter for palliative care: Secondary | ICD-10-CM

## 2021-06-22 DIAGNOSIS — Z7189 Other specified counseling: Secondary | ICD-10-CM

## 2021-06-22 LAB — BILIRUBIN, FRACTIONATED(TOT/DIR/INDIR)
Bilirubin, Direct: 0.3 mg/dL — ABNORMAL HIGH (ref 0.0–0.2)
Indirect Bilirubin: 1.4 mg/dL — ABNORMAL HIGH (ref 0.3–0.9)
Total Bilirubin: 1.7 mg/dL — ABNORMAL HIGH (ref 0.3–1.2)

## 2021-06-22 LAB — CBC
HCT: 37.8 % — ABNORMAL LOW (ref 39.0–52.0)
Hemoglobin: 13.3 g/dL (ref 13.0–17.0)
MCH: 34.5 pg — ABNORMAL HIGH (ref 26.0–34.0)
MCHC: 35.2 g/dL (ref 30.0–36.0)
MCV: 97.9 fL (ref 80.0–100.0)
Platelets: 118 10*3/uL — ABNORMAL LOW (ref 150–400)
RBC: 3.86 MIL/uL — ABNORMAL LOW (ref 4.22–5.81)
RDW: 15.8 % — ABNORMAL HIGH (ref 11.5–15.5)
WBC: 15.8 10*3/uL — ABNORMAL HIGH (ref 4.0–10.5)
nRBC: 0 % (ref 0.0–0.2)

## 2021-06-22 LAB — BASIC METABOLIC PANEL
Anion gap: 11 (ref 5–15)
BUN: 23 mg/dL (ref 8–23)
CO2: 22 mmol/L (ref 22–32)
Calcium: 9 mg/dL (ref 8.9–10.3)
Chloride: 105 mmol/L (ref 98–111)
Creatinine, Ser: 0.96 mg/dL (ref 0.61–1.24)
GFR, Estimated: >60 mL/min (ref >60)
Glucose, Bld: 143 mg/dL — ABNORMAL HIGH (ref 70–99)
Potassium: 3.7 mmol/L (ref 3.5–5.1)
Sodium: 138 mmol/L (ref 135–145)

## 2021-06-22 LAB — PROCALCITONIN: Procalcitonin: 0.81 ng/mL

## 2021-06-22 LAB — MRSA NEXT GEN BY PCR, NASAL: MRSA by PCR Next Gen: DETECTED — AB

## 2021-06-22 LAB — LACTIC ACID, PLASMA
Lactic Acid, Venous: 2.5 mmol/L (ref 0.5–1.9)
Lactic Acid, Venous: 1.9 mmol/L (ref 0.5–1.9)

## 2021-06-22 MED ORDER — CEFAZOLIN SODIUM-DEXTROSE 1-4 GM/50ML-% IV SOLN
1.0000 g | Freq: Three times a day (TID) | INTRAVENOUS | Status: DC
  Administered 2021-06-22 – 2021-06-25 (×10): 1 g via INTRAVENOUS
  Filled 2021-06-22 (×13): qty 50

## 2021-06-22 MED ORDER — METRONIDAZOLE 500 MG/100ML IV SOLN
500.0000 mg | Freq: Two times a day (BID) | INTRAVENOUS | Status: AC
  Administered 2021-06-22 – 2021-06-25 (×6): 500 mg via INTRAVENOUS
  Filled 2021-06-22 (×6): qty 100

## 2021-06-22 MED ORDER — CHLORHEXIDINE GLUCONATE CLOTH 2 % EX PADS
6.0000 | MEDICATED_PAD | Freq: Every day | CUTANEOUS | Status: DC
  Administered 2021-06-22: 6 via TOPICAL

## 2021-06-22 MED ORDER — MUPIROCIN 2 % EX OINT
1.0000 | TOPICAL_OINTMENT | Freq: Two times a day (BID) | CUTANEOUS | Status: DC
Start: 2021-06-22 — End: 2021-06-26
  Administered 2021-06-22 – 2021-06-25 (×7): 1 via NASAL
  Filled 2021-06-22 (×2): qty 22

## 2021-06-22 MED ORDER — GUAIFENESIN 100 MG/5ML PO SOLN: 5.0000 mL | ORAL | Status: DC | PRN

## 2021-06-22 MED ORDER — METRONIDAZOLE 500 MG/100ML IV SOLN: 500.0000 mg | Freq: Three times a day (TID) | INTRAVENOUS | Status: DC

## 2021-06-22 MED ORDER — CHLORHEXIDINE GLUCONATE CLOTH 2 % EX PADS
6.0000 | MEDICATED_PAD | Freq: Every day | CUTANEOUS | Status: DC
Start: 2021-06-22 — End: 2021-06-26
  Administered 2021-06-22 – 2021-06-25 (×4): 6 via TOPICAL

## 2021-06-22 NOTE — TOC Progression Note (Signed)
Transition of Care [facility]) - Progression Note    Patient Details  Name: William [name] MRN: [id number] Date of Birth: 12-23-1927  Transition of Care [facility]) CM/SW Contact  Zenon Mayo, RN Phone Number: 06/22/2021, 4:17 PM  Clinical Narrative:    Patient lives with wife, he is active with [facility] for HHPT, he also has private duty care per wife from 7a to 7p, so he has 24 hr care at home.  NCM confirmed with Tommi Rumps with [facility] regarding the HHPT.  Wife would like to continue with [facility].    Expected Discharge Plan: [place] Barriers to Discharge: Continued Medical Work up  Expected Discharge Plan and Services Expected Discharge Plan: [place]   Discharge Planning Services: CM Consult Post Acute Care Choice: [place] arrangements for the past 2 months: Single Family Home                   DME Agency: NA       HH Arranged: PT HH Agency: [place] Date [place]: 06/22/21 Time [place]: [id number] Representative spoke with at [place]: [place] Interventions    Readmission Risk Interventions No flowsheet data found.

## 2021-06-22 NOTE — Progress Notes (Addendum)
PROGRESS NOTE  William [name] [id number] DOB: 04-Apr-1928 DOA: 06/21/2021 PCP: Haywood Pao, MD   LOS: 1 day   Brief narrative:  William [name] is a 87 y.o. 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  Patient is nonverbal and bedbound for the last 2 years.  Patient does need assistance with daily living.  He has been having a lot of phlegm but not much cough.  EMS was called in.  In the ED, patient was noted to be afebrile with tachypnea.  Leukocytosis was present and lactic acid was elevated.  Chest x-ray noted low lung volumes with similar streaky bibasilar opacities thought atelectasis versus aspiration and/or pneumonia.  Urinalysis positive for large leukocytes, many bacteria, 1-50 RBCs/hpf, and greater than 50 WBCs. Influenza and COVID-19 screening was negative.  Patient was given 500 mL of normal saline IV fluids, cefepime, vancomycin, and azithromycin.  Patient was then admitted hospital for further evaluation and treatment.    Assessment/Plan:  Principal Problem:   Sepsis ([place]) Active Problems:   Prostate cancer ([place])   CAP (community acquired pneumonia)   Dementia ([place])   S/P TAVR (transcatheter aortic valve replacement)   Debilitated patient   History of ventricular tachycardia   Acute lower UTI   Sepsis secondary to urinary tract infection and/or possible pneumonia: Patient presented with tachypnea and leukocytosis and elevated lactate on presentation with possible source of infection is UTI and pneumonia representing sepsis..   Patient had abnormal urinalysis and chest x-ray showed streaky opacities with concern for possible  aspiration pneumonia.  Patient initially received IV fluid bolus, vancomycin, cefepime, and azithromycin.  Procalcitonin of 0.8.  We will follow-up blood cultures urine cultures.  Follow speech therapy evaluation and ID consultation.  Follow lactic acid.  Chronic systolic heart failure.  Last echocardiogram noted EF of 25 to 30% with left ventricular diffuse hypokinesis in 2020.  Continue strict intake and output charting, daily weights.  Atrial fibrillation/History of ventricular tachycardia:  History of AICD in the past which was explanted due to endocarditis.  On amiodarone at this time.     Elevated troponin: Chronic.   High-sensitivity troponin 22.  Suspect secondary to demand ischemia.Marland Kitchen   History of chronic cholecystitis history of recurrent cholangitis: With elevated bilirubin.  On cholecystostomy tube since patient is not a surgical candidate.    History of COVID: Negative at this time.  History of dementia debility, bedbound status/nonverbal:  Since 2020.  Continue supportive care. Palliative care has been consulted.    S/p TAVR in 2019   History of prostate cancer status post cryotherapy  DVT prophylaxis: enoxaparin (LOVENOX) injection 40 mg Start: 06/21/21 2200   Code Status: Partial code okay for intubation and BiPAP. Overall prognosis appears poor.   Family Communication: Spoke with the patient's wife on the phone and updated her about the clinical condition of the patient.  Status is: Inpatient  Remains inpatient appropriate because:Unsafe d/c plan, IV treatments appropriate due to intensity of illness or inability to take PO, and Inpatient level of care appropriate due to severity of illness  Dispo: The patient is from: Home              Anticipated d/c is to: Home/SNF  Patient currently is not medically stable to d/c.   Difficult to place patient No   Consultants: Infectious disease Palliative care  Procedures: None so  far  Anti-infectives:  Vancomycin and cefepime 9/8>  Anti-infectives (From admission, onward)    Start     Dose/Rate Route Frequency Ordered Stop   06/22/21 1000  vancomycin (VANCOREADY) IVPB 1250 mg/250 mL        1,250 mg 166.7 mL/hr over 90 Minutes Intravenous Every 24 hours 06/21/21 1234     06/21/21 2300  ceFEPIme (MAXIPIME) 2 g in sodium chloride 0.9 % 100 mL IVPB        2 g 200 mL/hr over 30 Minutes Intravenous Every 12 hours 06/21/21 1234     06/21/21 1115  vancomycin (VANCOREADY) IVPB 1500 mg/300 mL        1,500 mg 150 mL/hr over 120 Minutes Intravenous  Once 06/21/21 1108 06/21/21 1441   06/21/21 1115  ceFEPIme (MAXIPIME) 2 g in sodium chloride 0.9 % 100 mL IVPB        2 g 200 mL/hr over 30 Minutes Intravenous  Once 06/21/21 1108 06/21/21 1221   06/21/21 1115  azithromycin (ZITHROMAX) 500 mg in sodium chloride 0.9 % 250 mL IVPB        500 mg 250 mL/hr over 60 Minutes Intravenous  Once 06/21/21 1108 06/21/21 1328        Subjective: Today, patient was seen and examined at bedside. Nonverabal, sleepy,no interval complains reported.  Objective: Vitals:   06/22/21 0644 06/22/21 0732  BP: 121/77 126/74  Pulse: 98 89  Resp: (!) 24 18  Temp: 98.4 F (36.9 C) 98.1 F (36.7 C)  SpO2: 96% 99%    Intake/Output Summary (Last 24 hours) at 06/22/2021 0753 Last data filed at 06/22/2021 0500 Gross per 24 hour  Intake 1167.42 ml  Output 110 ml  Net 1057.42 ml   Filed Weights   06/22/21 0447  Weight: 89.8 kg   Body mass index is 28.41 kg/m.   Physical Exam: GENERAL: Patient is nonverbal, does not follow commands HENT: No scleral pallor or icterus. Pupils equally reactive to light. Oral mucosa is moist NECK: is supple, no gross swelling noted. CHEST: Clear to auscultation. No crackles or wheezes.  Diminished breath sounds bilaterally. CVS: S1 and S2 heard, no murmur.  Irregular heart rate ABDOMEN: Soft, non-tender, bowel sounds are present.  Biliary drain in the right  abdomen. Foley cath in place.  EXTREMITIES: No edema. CNS: Nonverbal.  Flexes on painful stimuli SKIN: warm and dryBruising noted.  Data Review: I have personally reviewed the following laboratory data and studies,  CBC: Recent Labs  Lab 06/21/21 1107 06/21/21 1113 06/22/21 0519  WBC 17.8*  --  15.8*  NEUTROABS 15.7*  --   --   HGB 14.2 13.6 13.3  HCT 41.6 40.0 37.8*  MCV 99.8  --  97.9  PLT 139*  --  123456*   Basic Metabolic Panel: Recent Labs  Lab 06/21/21 1107 06/21/21 1113 06/22/21 0519  NA 135 137 138  K 4.3 4.3 3.7  CL 103 104 105  CO2 22  --  22  GLUCOSE 191* 191* 143*  BUN '21 22 23  '$ CREATININE 1.11 1.00 0.96  CALCIUM 8.6*  --  9.0  MG 2.2  --   --    Liver Function Tests: Recent Labs  Lab 06/21/21 1107 06/22/21 0519  AST 17  --   ALT 10  --   ALKPHOS 106  --  BILITOT 2.2* 1.7*  PROT 7.0  --   ALBUMIN 3.0*  --    No results for input(s): LIPASE, AMYLASE in the last 168 hours. No results for input(s): AMMONIA in the last 168 hours. Cardiac Enzymes: No results for input(s): CKTOTAL, CKMB, CKMBINDEX, TROPONINI in the last 168 hours. BNP (last 3 results) Recent Labs    06/21/21 1107  BNP 208.9*    ProBNP (last 3 results) No results for input(s): PROBNP in the last 8760 hours.  CBG: No results for input(s): GLUCAP in the last 168 hours. Recent Results (from the past 240 hour(s))  Resp Panel by RT-PCR (Flu A&B, Covid) Nasopharyngeal Swab     Status: None   Collection Time: 06/21/21 10:07 AM   Specimen: Nasopharyngeal Swab; Nasopharyngeal(NP) swabs in vial transport medium  Result Value Ref Range Status   SARS Coronavirus 2 by RT PCR NEGATIVE NEGATIVE Final    Comment: (NOTE) SARS-CoV-2 target nucleic acids are NOT DETECTED.  The SARS-CoV-2 RNA is generally detectable in upper respiratory specimens during the acute phase of infection. The lowest concentration of SARS-CoV-2 viral copies this assay can detect is 138 copies/mL. A negative result  does not preclude SARS-Cov-2 infection and should not be used as the sole basis for treatment or other patient management decisions. A negative result may occur with  improper specimen collection/handling, submission of specimen other than nasopharyngeal swab, presence of viral mutation(s) within the areas targeted by this assay, and inadequate number of viral copies(<138 copies/mL). A negative result must be combined with clinical observations, patient history, and epidemiological information. The expected result is Negative.  Fact Sheet for Patients:  EntrepreneurPulse.com.au  Fact Sheet for Healthcare Providers:  IncredibleEmployment.be  This test is no t yet approved or cleared by the [place] FDA and  has been authorized for detection and/or diagnosis of SARS-CoV-2 by FDA under an Emergency Use Authorization (EUA). This EUA will remain  in effect (meaning this test can be used) for the duration of the COVID-19 declaration under Section 564(b)(1) of the Act, 21 U.S.C.section 360bbb-3(b)(1), unless the authorization is terminated  or revoked sooner.       Influenza A by PCR NEGATIVE NEGATIVE Final   Influenza B by PCR NEGATIVE NEGATIVE Final    Comment: (NOTE) The Xpert Xpress SARS-CoV-2/FLU/RSV plus assay is intended as an aid in the diagnosis of influenza from Nasopharyngeal swab specimens and should not be used as a sole basis for treatment. Nasal washings and aspirates are unacceptable for Xpert Xpress SARS-CoV-2/FLU/RSV testing.  Fact Sheet for Patients: EntrepreneurPulse.com.au  Fact Sheet for Healthcare Providers: IncredibleEmployment.be  This test is not yet approved or cleared by the [place] FDA and has been authorized for detection and/or diagnosis of SARS-CoV-2 by FDA under an Emergency Use Authorization (EUA). This EUA will remain in effect (meaning this test can be used) for  the duration of the COVID-19 declaration under Section 564(b)(1) of the Act, 21 U.S.C. section 360bbb-3(b)(1), unless the authorization is terminated or revoked.  Performed at [facility], [place] [address] [place], [place] [address]   MRSA Next Gen by PCR, Nasal     Status: Abnormal   Collection Time: 06/21/21 12:35 PM   Specimen: Nasal Mucosa; Nasal Swab  Result Value Ref Range Status   MRSA by PCR Next Gen DETECTED (A) NOT DETECTED Final    Comment: RESULT CALLED TO, READ BACK BY AND VERIFIED WITH: RN Nicola Girt [id number] [phone] MLM (NOTE) The GeneXpert MRSA Assay (FDA approved  for NASAL specimens only), is one component of a comprehensive MRSA colonization surveillance program. It is not intended to diagnose MRSA infection nor to guide or monitor treatment for MRSA infections. Test performance is not FDA approved in patients less than 69 years old. Performed at [facility], [place] [address] [place], [place] [address]      Studies: CT HEAD WO CONTRAST (5MM)  Result Date: 06/21/2021 CLINICAL DATA:  Altered mental status. EXAM: CT HEAD WITHOUT CONTRAST TECHNIQUE: Contiguous axial images were obtained from the base of the skull through the vertex without intravenous contrast. COMPARISON:  February 02, 2019. FINDINGS: Brain: Mild diffuse cortical atrophy. Mild chronic ischemic white matter disease. No mass effect or midline shift is noted. Ventricular size is within normal limits. There is no evidence of mass lesion, hemorrhage or acute infarction. 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 Skull: Normal. Negative for fracture or focal lesion. Sinuses/Orbits: No acute finding. Other: None. IMPRESSION: No acute intracranial abnormality seen. 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 Electronically Signed   By: Marijo Conception M.D.    On: 06/21/2021 13:49   DG Chest Port 1 View  Result Date: 06/21/2021 CLINICAL DATA:  Questionable sepsis - evaluate for abnormality EXAM: PORTABLE CHEST 1 VIEW COMPARISON:  November 30, 2020. FINDINGS: Low lung volumes. Elevated left hemidiaphragm. Streaky bibasilar opacities. No visible pleural effusions or pneumothorax. The patient's chin obscures the medial lung apices. No large pleural effusions. Chronically enlarged enlarged cardiac silhouette, partially obscured. Prior TAVR. Calcific atherosclerosis of the aorta. Partially imaged pigtail drain in the right upper quadrant. IMPRESSION: Low lung volumes with elevated left hemidiaphragm and similar streaky bibasilar opacities, most likely atelectasis. Aspiration and/or pneumonia is not excluded. Electronically Signed   By: Margaretha Sheffield M.D.   On: 06/21/2021 10:47      Flora Lipps, MD  [facility] 06/22/2021  If 7PM-7AM, please contact night-coverage

## 2021-06-22 NOTE — Consult Note (Signed)
[facility] for Infectious Disease    Date of Admission:  06/21/2021   Total days of antibiotics 2       Vanc, cefepime, azithromycin > DC        Reason for Consult: Possible biliary sepsis    Referring Provider: Dr. Louanne Belton   Assessment: Patient with history of recurrent cholecystitis s/p cholecystostomy placement and history of ESBL Klebsiella bacteremia who was hospitalized for tachycardia and tachypnea.  ID was consulted for possible biliary sepsis.  No history was obtained because patient is nonverbal.  Margretta Ditty to be biliary source at this time.  He has no right upper quadrant pain to palpation.  His drain was just exchanged on 8/26.  He is also not a surgical candidate because of multiple comorbidities.  Suspect UTI or aspiration pneumonia at this time.  UA consistent with infection.  Pending urine culture.  Also high risk for aspiration pneumonia per SLP eval and chest x-ray finding.  Will stop vancomycin, cefepime and azithromycin.  Start cefazolin for possible UTI.  Previous urine cultures grew pansensitive E. coli.  Will also add Flagyl for aspiration pneumonia coverage.  Plan: Stop vancomycin, cefepime and azithromycin Start cefazolin x 5d and Flagyl x 3d Follow urine culture  Principal Problem:   Sepsis ([place]) Active Problems:   Prostate cancer ([place])   CAP (community acquired pneumonia)   Dementia ([place])   S/P TAVR (transcatheter aortic valve replacement)   Debilitated patient   History of ventricular tachycardia   Acute lower UTI   Scheduled Meds:  acidophilus  1 capsule Oral Daily   amiodarone  200 mg Oral Daily   aspirin EC  81 mg Oral Q M,W,F   Chlorhexidine Gluconate Cloth  6 each Topical Daily   Chlorhexidine Gluconate Cloth  6 each Topical Q0600   enoxaparin (LOVENOX) injection  40 mg Subcutaneous Q24H   loratadine  10 mg Oral Daily   mupirocin ointment  1 application Nasal BID   polyethylene glycol  17 g Oral Daily   sodium chloride flush  3  mL Intravenous Q12H   ursodiol  600 mg Oral BID   zinc sulfate  220 mg Oral Daily   Continuous Infusions:   ceFAZolin (ANCEF) IV     metronidazole     PRN Meds:.acetaminophen **OR** acetaminophen, albuterol  HPI: William [name] is a 87 y.o. male with past medical history of HFrEF, recurrent cholecystitis in 2020 status post drain placement, history of ESBL Klebsiella bacteremia and endocarditis with ICD removal, who was hospitalized for tachycardia and tachypnea.  Patient is nonmobile and nonverbal.  He appears in no acute distress on exam.   Review of Systems: ROS patient is nonverbal  Past Medical History:  Diagnosis Date   AAA (abdominal aortic aneurysm) ([place])    7/13 3.8cm   Arthritis    "joints" (01/11/2014)   Asthma    "seasonal; some foods"    Chronic systolic CHF (congestive heart failure) ([place])    COVID-19 12/15/2019   Dementia ([place])    HLD (hyperlipidemia)    HOH (hard of hearing)    Lumbar vertebral fracture ([place])    L1- 04/11/2014    Parotitis 06/10/2019   Prostate cancer ([place])    S/P ICD (internal cardiac defibrillator) procedure, 01/11/14 removal of ERI gen and placement of Medtronic Evera XT VR & NEW Right ventricular lead Medtronic 01/12/2014   S/P TAVR (transcatheter aortic valve replacement)    Edwards Sapien 3 THV (size  29 mm, model # E5443329, serial # [id number])   Severe aortic stenosis    Sleep apnea    "lost 60# & don't have it anymore" (01/11/2014)   Somnolence 08/04/2019   UTI (urinary tract infection) 08/04/2019   Ventricular tachycardia ([facility])     Social History   Tobacco Use   Smoking status: Never   Smokeless tobacco: Never  Vaping Use   Vaping Use: Never used  Substance Use Topics   Alcohol use: Not Currently    Alcohol/week: 0.0 standard drinks    Comment: 01/11/2014 "drink 1/2 of a beer twice per month    Drug use: No    Family History  Problem Relation Age of Onset   Heart failure Mother        Died of "old age" at 39   Pneumonia  Father    Allergies  Allergen Reactions   Crestor [Rosuvastatin] Other (See Comments)    Hurts muscles   Lipitor [Atorvastatin] Other (See Comments)    Hurts stomach   Shrimp [Shellfish Allergy] Other (See Comments)    On MAR    OBJECTIVE: Blood pressure 126/74, pulse 89, temperature 98.1 F (36.7 C), resp. rate 18, height '5\' 10"'$  (1.778 m), weight 89.8 kg, SpO2 99 %.  Physical Exam Constitutional:      General: He is not in acute distress.    Comments: Asleep  Cardiovascular:     Rate and Rhythm: Normal rate and regular rhythm.  Pulmonary:     Effort: Pulmonary effort is normal.     Breath sounds: Normal breath sounds.  Abdominal:     General: There is no distension.     Palpations: Abdomen is soft.     Tenderness: There is no abdominal tenderness.     Comments: Negative for Murphy sign  Neurological:     Comments: Asleep.  Unknown baseline    Lab Results Lab Results  Component Value Date   WBC 15.8 (H) 06/22/2021   HGB 13.3 06/22/2021   HCT 37.8 (L) 06/22/2021   MCV 97.9 06/22/2021   PLT 118 (L) 06/22/2021    Lab Results  Component Value Date   CREATININE 0.96 06/22/2021   BUN 23 06/22/2021   NA 138 06/22/2021   K 3.7 06/22/2021   CL 105 06/22/2021   CO2 22 06/22/2021    Lab Results  Component Value Date   ALT 10 06/21/2021   AST 17 06/21/2021   ALKPHOS 106 06/21/2021   BILITOT 1.7 (H) 06/22/2021     Microbiology: Recent Results (from the past 240 hour(s))  Blood Culture (routine x 2)     Status: None (Preliminary result)   Collection Time: 06/21/21  9:45 AM   Specimen: Left Antecubital; Blood  Result Value Ref Range Status   Specimen Description LEFT ANTECUBITAL  Final   Special Requests   Final    BOTTLES DRAWN AEROBIC AND ANAEROBIC Blood Culture results may not be optimal due to an inadequate volume of blood received in culture bottles   Culture   Final    NO GROWTH < 24 HOURS Performed at [facility] [address], [place],  [place] [address]    Report Status PENDING  Incomplete  Resp Panel by RT-PCR (Flu A&B, Covid) Nasopharyngeal Swab     Status: None   Collection Time: 06/21/21 10:07 AM   Specimen: Nasopharyngeal Swab; Nasopharyngeal(NP) swabs in vial transport medium  Result Value Ref Range Status   SARS Coronavirus 2 by RT PCR NEGATIVE  NEGATIVE Final    Comment: (NOTE) SARS-CoV-2 target nucleic acids are NOT DETECTED.  The SARS-CoV-2 RNA is generally detectable in upper respiratory specimens during the acute phase of infection. The lowest concentration of SARS-CoV-2 viral copies this assay can detect is 138 copies/mL. A negative result does not preclude SARS-Cov-2 infection and should not be used as the sole basis for treatment or other patient management decisions. A negative result may occur with  improper specimen collection/handling, submission of specimen other than nasopharyngeal swab, presence of viral mutation(s) within the areas targeted by this assay, and inadequate number of viral copies(<138 copies/mL). A negative result must be combined with clinical observations, patient history, and epidemiological information. The expected result is Negative.  Fact Sheet for Patients:  EntrepreneurPulse.com.au  Fact Sheet for Healthcare Providers:  IncredibleEmployment.be  This test is no t yet approved or cleared by the [place] FDA and  has been authorized for detection and/or diagnosis of SARS-CoV-2 by FDA under an Emergency Use Authorization (EUA). This EUA will remain  in effect (meaning this test can be used) for the duration of the COVID-19 declaration under Section 564(b)(1) of the Act, 21 U.S.C.section 360bbb-3(b)(1), unless the authorization is terminated  or revoked sooner.       Influenza A by PCR NEGATIVE NEGATIVE Final   Influenza B by PCR NEGATIVE NEGATIVE Final    Comment: (NOTE) The Xpert Xpress SARS-CoV-2/FLU/RSV plus assay is intended as an  aid in the diagnosis of influenza from Nasopharyngeal swab specimens and should not be used as a sole basis for treatment. Nasal washings and aspirates are unacceptable for Xpert Xpress SARS-CoV-2/FLU/RSV testing.  Fact Sheet for Patients: EntrepreneurPulse.com.au  Fact Sheet for Healthcare Providers: IncredibleEmployment.be  This test is not yet approved or cleared by the [place] FDA and has been authorized for detection and/or diagnosis of SARS-CoV-2 by FDA under an Emergency Use Authorization (EUA). This EUA will remain in effect (meaning this test can be used) for the duration of the COVID-19 declaration under Section 564(b)(1) of the Act, 21 U.S.C. section 360bbb-3(b)(1), unless the authorization is terminated or revoked.  Performed at [facility], [place] [address] [place], [place] [address]   Blood Culture (routine x 2)     Status: None (Preliminary result)   Collection Time: 06/21/21 11:00 AM   Specimen: Right Antecubital; Blood  Result Value Ref Range Status   Specimen Description RIGHT ANTECUBITAL  Final   Special Requests   Final    BOTTLES DRAWN AEROBIC AND ANAEROBIC Blood Culture adequate volume   Culture   Final    NO GROWTH < 24 HOURS Performed at [facility], [place] [address], [place], [place] [address]    Report Status PENDING  Incomplete  MRSA Next Gen by PCR, Nasal     Status: Abnormal   Collection Time: 06/21/21 12:35 PM   Specimen: Nasal Mucosa; Nasal Swab  Result Value Ref Range Status   MRSA by PCR Next Gen DETECTED (A) NOT DETECTED Final    Comment: RESULT CALLED TO, READ BACK BY AND VERIFIED WITH: RN Nicola Girt [phone] (NOTE) The GeneXpert MRSA Assay (FDA approved for NASAL specimens only), is one component of a comprehensive MRSA colonization surveillance program. It is not intended to diagnose MRSA infection nor to guide or monitor treatment for MRSA infections. Test performance is not  FDA approved in patients less than 48 years old. Performed at [facility], [place] [address], [place], [place] [address]  Gaylan Gerold, [facility] [phone] pager    06/22/2021, 10:29 AM

## 2021-06-22 NOTE — Consult Note (Signed)
Consultation Note Date: 06/22/2021   Patient Name: William [name]  DOB: [birth year]  MRN: [id number]  Age / Sex: 87 y.o., male  PCP: Tisovec, Fransico Him, MD Referring Physician: Flora Lipps, MD  Reason for Consultation: Establishing goals of care, "87 yo debilitated presents with sepsis PNA and /or UTI"  HPI/Patient Profile: 87 y.o. male  with past medical history of systolic CHF, ventricular tachycardia status post ICD extraction due to endocarditis, aortic stenosis s/p TAVR, cholecystitis s/p biliary drain as deemed not a surgical candidate, ESBL/resistant organism infections, and dementia presented to the ED on 06/21/21 after his [facility] RN noted AMS and increased respiratory rate. Patient is nonverbal and bed bound baseline. Patient was admitted on 06/21/2021 with sepsis secondary to UTI and possible aspiration pneumonia.   ED Course: Upon admission into the emergency department patient was seen to be afebrile, pulse 49-99, respiration 28-33, and all other vital signs maintained labs significant for WBC 17.8, hemoglobin 14.2, platelets 139, BUN 21, creatinine 1.11, glucose 191, troponin 22, BNP 208.9, and lactic acid 2.6.  Chest x-ray noted low lung volumes with similar streaky bibasilar opacities thought atelectasis versus aspiration and/or pneumonia.  Urinalysis positive for large leukocytes, many bacteria, 1-50 RBCs/hpf, and greater than 50 WBCs.Influenza and COVID-19 screening was negative.  Patient had been given 500 mL of normal saline IV fluids, cefepime, vancomycin, and azithromycin.    Clinical Assessment and Goals of Care: I have reviewed medical records including EPIC notes, labs, and imaging. Received report from primary RN - no acute concerns.   Went to visit patient at bedside - wife/[name] present. Patient was lying in bed - he does not open his eyes to voice/gentle touch and is not able to participate  in conversation. No signs or non-verbal gestures of pain or discomfort noted. No respiratory distress, increased work of breathing, or secretions noted.   Met with Webb Silversmith  to discuss diagnosis, prognosis, GOC, EOL wishes, disposition, and options.  I introduced Palliative Medicine as specialized medical care for people living with serious illness. It focuses on providing relief from the symptoms and stress of a serious illness. The goal is to improve quality of life for both the patient and the family.  After introduction, Webb Silversmith was clear she did not want to speak with Palliative Medicine team. She stated she "wants everything done for patient to keep him here for as many days as she can."   Gently reiterated that I was not present to talk her out of treatments, but wished to support her and patient in a holistic way and review/discuss goals for patient's care. She seemed hesitant but did agree to speak with me.  We discussed a brief life review of the patient as well as functional and nutritional status. 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 Webb Silversmith explains he loved working with people and "always had a smile on his face." She and the patient have been married for 66 years - they  have two daughters. Prior to hospitalization, patient was living in a private residence with Webb Silversmith. He was independent with ADLs and needed +1 assistance to walk prior to his COVID infection in 09/2019. In early 2021 Webb Silversmith states that the patient "never regained his strength" and has been bed bound since that time. Over time he has also become nonverbal and incontinent of bowel/bladder. Patient currently has several home health services that see him, which include: RN visit every day, PT visit every day, as well as 27/7 home health aid support. Webb Silversmith reports patient has a great appetite at home - she reviewed a typical day and his meals. She reports patient does not have any  trouble swallowing she is aware of. Webb Silversmith explains that patient was first diagnosed with dementia in 2021.   We discussed patient's current illness and what it means in the larger context of patient's on-going co-morbidities. Briefly touched on and reviewed that there will become a time when medical interventions will no longer be able to prolong patient's life and comfort care would be appropriate at that time. She expressed understanding, but states "for now, I want everything done for him." Reviewed that medical team is providing all recommended treatments for his condition. She asked if PMT involvement in his care would change any medical treatment - explained that PMT is able to work with patient along side curative/life-prolonging focused treatments and his treatment plan would not change by PMT being involved. She is clear again that she wants "everything done" and wants to continue antibiotics.  Webb Silversmith expressed great concern over patient's MRSA diagnosis. Education provided on MRSA. Webb Silversmith was also anxious to know if Dr. Tommy Medal had been notified patient was admitted. She explained attending yesterday was going to reach out, and she was not sure this had been done. Patient is well known, per Lelon Frohlich, to Dr. Tommy Medal. Explained I was not sure if he had been notified but could find out and call her later with an update - she was appreciative.   Chaplain services offered - Webb Silversmith declined.   I did not address topics of: advance directives, concepts specific to code status, artificial feeding and hydration, or rehospitalization as visit today was focused on rapport building. Hopefully Webb Silversmith can gain trust with PMT inpatient so she may be agreeable to outpatient Palliative Care to follow. Patient/family would benefit from ongoing [place] conversations as it does seem patient is declining and would benefit from hospice services in the near future.   Discussed with patient/family the importance of continued conversation  with each other and the medical providers regarding overall plan of care and treatment options, ensuring decisions are within the context of the patient's values and GOCs.    Questions and concerns were addressed. The patient/family was encouraged to call with questions and/or concerns. PMT card was provided.  Notified Dr. Louanne Belton of patient's request to ensure Dr. Tommy Medal was notified of patient's admission. Dr. Louanne Belton notified Dr. Tommy Medal. Appreciate Dr. Lucianne Lei Dams input.   Called Webb Silversmith to provide update that Dr. Louanne Belton and I had spoken with Dr. Tommy Medal - validated that he was aware of patient's admission and that patient had also already been seen by ID. She was appreciative.    Primary Decision Maker: Wife/[name] Ausburn    SUMMARY OF RECOMMENDATIONS   Continue current full scope medical treatment Continue partial code as previously documented Majority of visit today was focused on rapport building with wife/[name] as she initially was refusing PMT involvement.  Hopefully Webb Silversmith can gain trust with PMT inpatient so she may be agreeable to outpatient Palliative Care to follow. Patient/family would benefit from ongoing [place] conversations as it does seem patient is declining and would benefit from hospice services in the near future. Patient would be hospice appropriate now if wife wanted to focus on comfort. I do not foresee goals changing during this admission unless he has an acute decline.  PMT will continue to follow and support holistically   Code Status/Advance Care Planning: Limited code   Palliative Prophylaxis:  Aspiration, Bowel Regimen, Delirium Protocol, Frequent Pain Assessment, Oral Care, and Turn Reposition  Additional Recommendations (Limitations, Scope, Preferences): Full Scope Treatment  Psycho-social/Spiritual:  Desire for further Chaplaincy support:no Created space and opportunity for patient and family to express thoughts and feelings regarding patient's current  medical situation.  Emotional support and therapeutic listening provided.  Prognosis:  Poor in the setting of advanced age, dementia, recurrent hospitalizations, and multiple comorbidities  Discharge Planning: back home with continued [facility] services. Hopefully wife will be agreeable to outpatient Palliative Care      Primary Diagnoses: Present on Admission:  Sepsis ([place])  Acute lower UTI  Prostate cancer ([place])  Dementia ([place])  Debilitated patient   I have reviewed the medical record, interviewed the patient and family, and examined the patient. The following aspects are pertinent.  Past Medical History:  Diagnosis Date   AAA (abdominal aortic aneurysm) ([place])    7/13 3.8cm   Arthritis    "joints" (01/11/2014)   Asthma    "seasonal; some foods"    Chronic systolic CHF (congestive heart failure) ([place])    COVID-19 12/15/2019   Dementia (H[facility])    HLD (hyperlipidemia)    HOH (hard of hearing)    Lumbar vertebral fracture ([place])    L1- 04/11/2014    Parotitis 06/10/2019   Prostate cancer ([place])    S/P ICD (internal cardiac defibrillator) procedure, 01/11/14 removal of ERI gen and placement of Medtronic Evera XT VR & NEW Right ventricular lead Medtronic 01/12/2014   S/P TAVR (transcatheter aortic valve replacement)    Edwards Sapien 3 THV (size 29 mm, model # [id number], serial # [id number])   Severe aortic stenosis    Sleep apnea    "lost 60# & don't have it anymore" (01/11/2014)   Somnolence 08/04/2019   UTI (urinary tract infection) 08/04/2019   Ventricular tachycardia ([place])    Social History   Socioeconomic History   Marital status: Married    Spouse name: Not on file   Number of children: 2   Years of education: Not on file   Highest education level: Not on file  Occupational History   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[employer]"  Tobacco Use   Smoking status: Never   Smokeless tobacco: Never  Vaping Use   Vaping Use: Never used  Substance and Sexual Activity   Alcohol use:  Not Currently    Alcohol/week: 0.0 standard drinks    Comment: 01/11/2014 "drink 1/2 of a beer twice per month    Drug use: No   Sexual activity: Not Currently  Other Topics Concern   Not on file  Social History Narrative   Not on file   Social Determinants of Health   Financial Resource Strain: Not on file  Food Insecurity: Not on file  Transportation Needs: Not on file  Physical Activity: Not on file  Stress: Not on file  Social Connections: Not on file   Family History  Problem Relation Age of  Onset   Heart failure Mother        Died of "old age" at 39   Pneumonia Father    Scheduled Meds:  acidophilus  1 capsule Oral Daily   amiodarone  200 mg Oral Daily   aspirin EC  81 mg Oral Q M,W,F   Chlorhexidine Gluconate Cloth  6 each Topical Daily   Chlorhexidine Gluconate Cloth  6 each Topical Q0600   enoxaparin (LOVENOX) injection  40 mg Subcutaneous Q24H   loratadine  10 mg Oral Daily   mupirocin ointment  1 application Nasal BID   polyethylene glycol  17 g Oral Daily   sodium chloride flush  3 mL Intravenous Q12H   ursodiol  600 mg Oral BID   zinc sulfate  220 mg Oral Daily   Continuous Infusions:   ceFAZolin (ANCEF) IV     metronidazole 500 mg (06/22/21 1155)   PRN Meds:.acetaminophen **OR** acetaminophen, albuterol Medications Prior to Admission:  Prior to Admission medications   Medication Sig Start Date End Date Taking? Authorizing Provider  amiodarone (PACERONE) 200 MG tablet TAKE 1 TABLET(200 MG) BY MOUTH DAILY Patient taking differently: Take 200 mg by mouth daily. 05/08/21  Yes Croitoru, Mihai, MD  aspirin EC 81 MG tablet Take 81 mg by mouth every Monday, Wednesday, and Friday.   Yes [provider]  cholecalciferol (VITAMIN D) 1000 units tablet Take 2,000 Units by mouth at bedtime.   Yes [provider]  Cranberry-Vit C-Lactobacillus (RA CRANBERRY SUPPLEMENTS PO) Take by mouth in the morning and at bedtime.    Yes [provider]   fexofenadine (ALLEGRA) 60 MG tablet Take 60 mg by mouth daily.   Yes [provider]  Multiple Vitamins-Minerals (PRESERVISION AREDS 2 PO) Take by mouth. Take one tablet by mouth dailey    Yes [provider]  polyethylene glycol (MIRALAX / GLYCOLAX) 17 g packet Take 17 g by mouth as needed.   Yes [provider]  Probiotic Product (PROBIOTIC DAILY PO) Take 1 capsule by mouth daily.   Yes [provider]  ursodiol (ACTIGALL) 300 MG capsule TAKE 2 CAPSULES(600 MG) BY MOUTH TWICE DAILY Patient taking differently: Take 600 mg by mouth 2 (two) times daily. 07/17/20  Yes Milus Banister, MD  zinc gluconate 50 MG tablet Take 50 mg by mouth daily.   Yes [provider]  potassium chloride (KLOR-CON) 10 MEQ tablet Take 1 tablet (10 mEq total) by mouth daily. Patient not taking: Reported on 06/21/2021 01/09/21   Croitoru, Dani Gobble, MD  Sodium Chloride Flush (NORMAL SALINE FLUSH) 0.9 % SOLN Use 5 mLs as directed every 12 hours (to flush drain) 05/22/21   Ardis Rowan, PA-C   Allergies  Allergen Reactions   Crestor [Rosuvastatin] Other (See Comments)    Hurts muscles   Lipitor [Atorvastatin] Other (See Comments)    Hurts stomach   Shrimp [Shellfish Allergy] Other (See Comments)    On MAR   Review of Systems  Unable to perform ROS: Dementia   Physical Exam Vitals and nursing note reviewed.  Constitutional:      General: He is not in acute distress.    Appearance: He is ill-appearing.  Pulmonary:     Effort: No respiratory distress.  Skin:    General: Skin is warm and dry.  Neurological:     Mental Status: He is lethargic.     Motor: Weakness present.  Psychiatric:        Speech: He is noncommunicative.  Vital Signs: BP 104/73 (BP Location: Left Arm)   Pulse 88   Temp 97.9 F (36.6 C) (Oral)   Resp 17   Ht '5\' 10"'  (1.778 m)   Wt 89.8 kg   SpO2 98%   BMI 28.41 kg/m  Pain Scale: 0-10   Pain Score: Asleep   SpO2: SpO2: 98 % O2  Device:SpO2: 98 % O2 Flow Rate: .   IO: Intake/output summary:  Intake/Output Summary (Last 24 hours) at 06/22/2021 1235 Last data filed at 06/22/2021 1103 Gross per 24 hour  Intake 1167.42 ml  Output 210 ml  Net 957.42 ml    LBM: Last BM Date: 06/19/21 Baseline Weight: Weight: 89.8 kg Most recent weight: Weight: 89.8 kg     Palliative Assessment/Data: PPS 30%     Time In: 1245 Time Out: 1355 Time Total: 75 minutes  Greater than 50%  of this time was spent counseling and coordinating care related to the above assessment and plan.  Signed by: Lin Landsman, NP   Please contact Palliative Medicine Team phone at [phone] for questions and concerns.  For individual provider: See Shea Evans

## 2021-06-22 NOTE — Evaluation (Signed)
Clinical/Bedside Swallow Evaluation Patient Details  Name: William [name] MRN: [id number] Date of Birth: [birth year]  Today's Date: 06/22/2021 Time: SLP Start Time (ACUTE ONLY): 12 SLP Stop Time (ACUTE ONLY): [id number] SLP Time Calculation (min) (ACUTE ONLY): 22 min  Past Medical History:  Past Medical History:  Diagnosis Date   AAA (abdominal aortic aneurysm) ([place])    7/13 3.8cm   Arthritis    "joints" (01/11/2014)   Asthma    "seasonal; some foods"    Chronic systolic CHF (congestive heart failure) ([place])    COVID-19 12/15/2019   Dementia ([place])    HLD (hyperlipidemia)    HOH (hard of hearing)    Lumbar vertebral fracture ([place])    L1- 04/11/2014    Parotitis 06/10/2019   Prostate cancer ([place])    S/P ICD (internal cardiac defibrillator) procedure, 01/11/14 removal of ERI gen and placement of Medtronic Evera XT VR & NEW Right ventricular lead Medtronic 01/12/2014   S/P TAVR (transcatheter aortic valve replacement)    Edwards Sapien 3 THV (size 29 mm, model # [id number], serial # [id number])   Severe aortic stenosis    Sleep apnea    "lost 60# & don't have it anymore" (01/11/2014)   Somnolence 08/04/2019   UTI (urinary tract infection) 08/04/2019   Ventricular tachycardia ([place])    Past Surgical History:  Past Surgical History:  Procedure Laterality Date   CARDIAC CATHETERIZATION  08/20/2004   noncritical CAD,mild global hypokinesis, EF 50%   CARDIAC DEFIBRILLATOR PLACEMENT  08/23/2004   Medtronic   CATARACT EXTRACTION W/ INTRAOCULAR LENS IMPLANT Right    COLECTOMY  1990's   CRYOABLATION N/A 02/24/2014   Procedure: CRYO ABLATION PROSTATE;  Surgeon: Ailene Rud, MD;  Location: [facility] ORS;  Service: Urology;  Laterality: N/A;   ERCP N/A 01/29/2019   Procedure: ENDOSCOPIC RETROGRADE CHOLANGIOPANCREATOGRAPHY (ERCP);  Surgeon: Milus Banister, MD;  Location[facility] ENDOSCOPY;  Service: Endoscopy;  Laterality: N/A;   HERNIA REPAIR     "abdomen; from colon OR"   ICD LEAD REMOVAL N/A 09/15/2018    Procedure: ICD LEAD REMOVAL EXTRACTION;  Surgeon: Evans Lance, MD;  Location: [facility] OR;  Service: Cardiovascular;  Laterality: N/A;  DR. BARTLE TO BACK UP   IMPLANTABLE CARDIOVERTER DEFIBRILLATOR (ICD) GENERATOR CHANGE N/A 01/11/2014   Procedure: ICD GENERATOR CHANGE;  Surgeon: Sanda Klein, MD;  Location: [facility] CATH LAB;  Service: Cardiovascular;  Laterality: N/A;   IR CATHETER TUBE CHANGE  03/01/2019   IR CATHETER TUBE CHANGE  04/05/2019   IR CATHETER TUBE CHANGE  07/05/2019   IR CATHETER TUBE CHANGE  08/02/2019   IR CATHETER TUBE CHANGE  08/30/2019   IR CATHETER TUBE CHANGE  09/27/2019   IR CATHETER TUBE CHANGE  10/25/2019   IR CATHETER TUBE CHANGE  11/29/2019   IR EXCHANGE BILIARY DRAIN  12/18/2018   IR EXCHANGE BILIARY DRAIN  01/05/2019   IR EXCHANGE BILIARY DRAIN  01/25/2019   IR EXCHANGE BILIARY DRAIN  01/13/2020   IR EXCHANGE BILIARY DRAIN  02/28/2020   IR EXCHANGE BILIARY DRAIN  04/24/2020   IR EXCHANGE BILIARY DRAIN  07/03/2020   IR EXCHANGE BILIARY DRAIN  09/14/2020   IR EXCHANGE BILIARY DRAIN  11/27/2020   IR EXCHANGE BILIARY DRAIN  01/23/2021   IR EXCHANGE BILIARY DRAIN  03/30/2021   IR EXCHANGE BILIARY DRAIN  06/08/2021   IR PERC CHOLECYSTOSTOMY  11/22/2018   IR RADIOLOGIST EVAL & MGMT  02/26/2019   IR RADIOLOGIST EVAL & MGMT  03/17/2019   IR  SINUS/FIST TUBE CHK-NON GI  01/28/2019   LEAD REVISION N/A 01/11/2014   Procedure: LEAD REVISION;  Surgeon: Sanda Klein, MD;  Location: [facility] CATH LAB;  Service: Cardiovascular;  Laterality: N/A;   NM MYOCAR PERF WALL MOTION  01/29/2012   abnormal c/o infarct/scar,no ischemia present   PROSTATE BIOPSY N/A 11/22/2013   Procedure: PROSTATE BIOPSY AND ULTRASOUND;  Surgeon: Ailene Rud, MD;  Location: [facility];  Service: Urology;  Laterality: N/A;   REMOVAL OF STONES  01/29/2019   Procedure: REMOVAL OF STONES;  Surgeon: Milus Banister, MD;  Location: [facility] ENDOSCOPY;  Service: Endoscopy;;   RIGHT/LEFT HEART CATH AND CORONARY ANGIOGRAPHY N/A 07/06/2018    Procedure: RIGHT/LEFT HEART CATH AND CORONARY ANGIOGRAPHY;  Surgeon: Troy Sine, MD;  Location: [facility] [place] CV LAB;  Service: Cardiovascular;  Laterality: N/A;   SPHINCTEROTOMY  01/29/2019   Procedure: SPHINCTEROTOMY;  Surgeon: Milus Banister, MD;  Location: [facility] ENDOSCOPY;  Service: Endoscopy;;   TEE WITHOUT CARDIOVERSION N/A 08/18/2018   Procedure: TRANSESOPHAGEAL ECHOCARDIOGRAM (TEE);  Surgeon: Burnell Blanks, MD;  Location: [facility];  Service: Open Heart Surgery;  Laterality: N/A;   TEE WITHOUT CARDIOVERSION N/A 09/08/2018   Procedure: TRANSESOPHAGEAL ECHOCARDIOGRAM (TEE);  Surgeon: Lelon Perla, MD;  Location: [facility] ENDOSCOPY;  Service: Cardiovascular;  Laterality: N/A;   TEE WITHOUT CARDIOVERSION N/A 09/15/2018   Procedure: TRANSESOPHAGEAL ECHOCARDIOGRAM (TEE);  Surgeon: Evans Lance, MD;  Location: [facility] OR;  Service: Cardiovascular;  Laterality: N/A;   TEE WITHOUT CARDIOVERSION N/A 11/26/2018   Procedure: TRANSESOPHAGEAL ECHOCARDIOGRAM (TEE);  Surgeon: Jerline Pain, MD;  Location: [facility] ENDOSCOPY;  Service: Cardiovascular;  Laterality: N/A;   TRANSCATHETER AORTIC VALVE REPLACEMENT, TRANSFEMORAL  08/18/2018   TRANSCATHETER AORTIC VALVE REPLACEMENT, TRANSFEMORAL N/A 08/18/2018   Procedure: TRANSCATHETER AORTIC VALVE REPLACEMENT, TRANSFEMORAL using an Edwards 13m Aortic Valve;  Surgeon: MBurnell Blanks MD;  Location: [facility]  Service: Open Heart Surgery;  Laterality: N/A;   HPI:  87y.o. male brought to ED by EMS from home with UTI, possible pna. 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 Pt non-verbal, bedbound at baseline. He has been eating regular diet with assistance pta. Had clinical swallow eval in Feb 2020 after a brief intubation, swallowing  dysfunction was transient and resolved prior to discharge.   Assessment / Plan / Recommendation Clinical Impression  Preliminary but limited swallow assessment completed at bedside.  Max tactile/verbal/visual prompting offered; face washed with cool cloth.  Pt did not open eyes.  He allowed oral care, which led to intermittent cough.  1/4 tspns water and ice chips did not elicit purposeful manipulation nor spontaneous swallowing.  Residue suctioned from mouth. Pt remained lethargic.  Sp02 fluctuated 88-92% during eval.  Rec frequent oral care; allow ice chips if he awakens.  SLP will f/u for PO readiness.Results were communicated to Dr. PLouanne Beltonand pt's RN. SLP Visit Diagnosis: Dysphagia, unspecified (R13.10)    Aspiration Risk       Diet Recommendation   NPO       Other  Recommendations Oral Care Recommendations: Oral care QID   Follow up Recommendations Other (comment) (tba)      Frequency and Duration min 2x/week  1 week       Prognosis Prognosis for Safe Diet Advancement: [place]  Date of Onset: 06/21/21 HPI: 87 y.o. male brought to ED by EMS from home with UTI, possible pna. 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 Pt non-verbal, bedbound at baseline. Normally eats regular diet.  Apparently the patient had been in his normal state of health yesterday and his nurse normally gets him up with [facility] lift for couple hours a day.  His wife notes that he has fragile skin, but no bedsores to her knowledge.  He has been eating regular diet with assistance pta. Had clinical swallow eval in Feb 2020 after a brief intubation, swallowing dysfunction was transient and resolved prior to discharge. Type of Study: Bedside Swallow Evaluation Previous  Swallow Assessment: see HPI Diet Prior to this Study: Regular;Thin liquids Temperature Spikes Noted: No Respiratory Status: Room air History of Recent Intubation: No Behavior/Cognition: Lethargic/Drowsy Oral Cavity Assessment: Other (comment) (difficult to assess) Oral Care Completed by SLP: Yes Oral Cavity - Dentition: Missing dentition Self-Feeding Abilities: Total assist Patient Positioning: Upright in bed Baseline Vocal Quality: Not observed Volitional Cough: Cognitively unable to elicit Volitional Swallow: Unable to elicit    Oral/Motor/Sensory Function Overall Oral Motor/Sensory Function: Other (comment) (symmetric at rest)   Ice Chips Ice chips: Impaired Presentation: Spoon Oral Phase Impairments: Poor awareness of bolus   Thin Liquid Thin Liquid: Impaired Presentation: Straw Oral Phase Impairments: Poor awareness of bolus    Nectar Thick Nectar Thick Liquid: Not tested   Honey Thick Honey Thick Liquid: Not tested   Puree Puree: Not tested   Solid     Solid: Not tested      Juan Quam Laurice 06/22/2021,9:39 AM Estill Bamberg L. Tivis Ringer, [place] Office number [phone] Pager [phone]

## 2021-06-23 ENCOUNTER — Encounter (HOSPITAL_COMMUNITY): Payer: Self-pay | Admitting: Internal Medicine

## 2021-06-23 DIAGNOSIS — R5381 Other malaise: Secondary | ICD-10-CM | POA: Diagnosis not present

## 2021-06-23 DIAGNOSIS — A419 Sepsis, unspecified organism: Secondary | ICD-10-CM | POA: Diagnosis not present

## 2021-06-23 DIAGNOSIS — N390 Urinary tract infection, site not specified: Secondary | ICD-10-CM | POA: Diagnosis not present

## 2021-06-23 DIAGNOSIS — J189 Pneumonia, unspecified organism: Secondary | ICD-10-CM | POA: Diagnosis not present

## 2021-06-23 LAB — URINE CULTURE: Culture: >=100000 — AB

## 2021-06-23 LAB — COMPREHENSIVE METABOLIC PANEL
ALT: 9 U/L (ref 0–44)
AST: 13 U/L — ABNORMAL LOW (ref 15–41)
Albumin: 2.2 g/dL — ABNORMAL LOW (ref 3.5–5.0)
Alkaline Phosphatase: 73 U/L (ref 38–126)
Anion gap: 6 (ref 5–15)
BUN: 23 mg/dL (ref 8–23)
CO2: 23 mmol/L (ref 22–32)
Calcium: 8.1 mg/dL — ABNORMAL LOW (ref 8.9–10.3)
Chloride: 109 mmol/L (ref 98–111)
Creatinine, Ser: 0.83 mg/dL (ref 0.61–1.24)
GFR, Estimated: >60 mL/min (ref >60)
Glucose, Bld: 119 mg/dL — ABNORMAL HIGH (ref 70–99)
Potassium: 3.3 mmol/L — ABNORMAL LOW (ref 3.5–5.1)
Sodium: 138 mmol/L (ref 135–145)
Total Bilirubin: 0.9 mg/dL (ref 0.3–1.2)
Total Protein: 5.6 g/dL — ABNORMAL LOW (ref 6.5–8.1)

## 2021-06-23 LAB — CBC
HCT: 34.7 % — ABNORMAL LOW (ref 39.0–52.0)
Hemoglobin: 11.9 g/dL — ABNORMAL LOW (ref 13.0–17.0)
MCH: 33.7 pg (ref 26.0–34.0)
MCHC: 34.3 g/dL (ref 30.0–36.0)
MCV: 98.3 fL (ref 80.0–100.0)
Platelets: 117 10*3/uL — ABNORMAL LOW (ref 150–400)
RBC: 3.53 MIL/uL — ABNORMAL LOW (ref 4.22–5.81)
RDW: 15.4 % (ref 11.5–15.5)
WBC: 10.3 10*3/uL (ref 4.0–10.5)
nRBC: 0 % (ref 0.0–0.2)

## 2021-06-23 LAB — PHOSPHORUS: Phosphorus: 2.7 mg/dL (ref 2.5–4.6)

## 2021-06-23 LAB — MAGNESIUM: Magnesium: 2 mg/dL (ref 1.7–2.4)

## 2021-06-23 MED ORDER — BISACODYL 10 MG RE SUPP
10.0000 mg | Freq: Every day | RECTAL | Status: DC | PRN
  Administered 2021-06-24: 10 mg via RECTAL
  Filled 2021-06-23: qty 1

## 2021-06-23 MED ORDER — POTASSIUM CHLORIDE 10 MEQ/100ML IV SOLN
10.0000 meq | INTRAVENOUS | Status: AC
Start: 2021-06-23 — End: 2021-06-23
  Administered 2021-06-23 (×4): 10 meq via INTRAVENOUS
  Filled 2021-06-23 (×4): qty 100

## 2021-06-23 NOTE — Progress Notes (Signed)
  Speech Language Pathology Treatment: Dysphagia  Patient Details Name: William [name] MRN: [id number] DOB: 07/01/1928 Today's Date: 06/23/2021 Time: [id number] SLP Time Calculation (min) (ACUTE ONLY): 21 min  Assessment / Plan / Recommendation Clinical Impression  Wife present at bedside for PO trials this date. She reports that pt has consumed a few meals today, though initial swallow eval recommended NPO. Per RN, she observed overt s/sx of aspiration with wife attempting to assist pt with PO intake. Pt very lethargic this afternoon with only brief eye opening with damp wash cloth to face and max verbal cues. He did not orally accept ice chip or bite of puree. Recent chest xray (9/8) noted: "Low lung volumes with elevated left hemidiaphragm and similar streaky bibasilar opacities, most likely atelectasis. Aspiration and/or pneumonia is not excluded". Continue to recommend NPO given reduced mentation and pulmonary status. SLP will continue efforts to f/u in regards to PO readiness. Wife educated regarding recommendations this date, however unsure of compliance. Notified her that SLP will f/u on subsequent date as schedule allows. Discussed above with RN.   HPI HPI: 87 y.o. male brought to ED by EMS from home with UTI, possible pna. 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 Pt non-verbal, bedbound at baseline. Normally eats regular diet.  Apparently the patient had been in his normal state of health yesterday and his nurse normally gets him up with [facility] lift for couple hours a day.  His wife notes that he has fragile skin, but no bedsores to her knowledge.  He has been eating regular diet with assistance pta. Had clinical swallow eval in Feb 2020 after a brief intubation,  swallowing dysfunction was transient and resolved prior to discharge.      SLP Plan  Continue with current plan of care       Recommendations  Diet recommendations: NPO;Other(comment) (may have ice chips after oral care when alert) Liquids provided via: Teaspoon Supervision: Full supervision/cueing for compensatory strategies Postural Changes and/or Swallow Maneuvers: Seated upright 90 degrees                Oral Care Recommendations: Oral care QID Follow up Recommendations: Other (comment) (TBD) SLP Visit Diagnosis: Dysphagia, unspecified (R13.10) Plan: Continue with current plan of care       [place], [place], [place] Office Number: [phone]  Acie Fredrickson 06/23/2021, 2:08 PM

## 2021-06-23 NOTE — Progress Notes (Signed)
PROGRESS NOTE  William [name] [id number] DOB: [birth year] DOA: 06/21/2021 PCP: Haywood Pao, MD   LOS: 2 days   Brief narrative:  William [name] is a 87 y.o. 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  Patient is nonverbal and bedbound for the last 2 years.  Patient does need assistance with daily living.  He has been having a lot of phlegm but not much cough.  EMS was called in.  In the ED, patient was noted to be afebrile with tachypnea.  Leukocytosis was present and lactic acid was elevated.  Chest x-ray noted low lung volumes with similar streaky bibasilar opacities thought atelectasis versus aspiration and/or pneumonia.  Urinalysis positive for large leukocytes, many bacteria, 1-50 RBCs/hpf, and greater than 50 WBCs. Influenza and COVID-19 screening was negative.  Patient was given 500 mL of normal saline IV fluids, cefepime, vancomycin, and azithromycin.  Patient was then admitted hospital for further evaluation and treatment.   Assessment/Plan:  Principal Problem:   Sepsis ([place]) Active Problems:   Prostate cancer ([place])   CAP (community acquired pneumonia)   Dementia ([place])   S/P TAVR (transcatheter aortic valve replacement)   Debilitated patient   History of ventricular tachycardia   Acute lower UTI  Sepsis secondary to E. coli urinary tract infection   Patient had abnormal urinalysis and chest x-ray showed streaky opacities.. Currently on IV antibiotic.  Patient has minimal respiratory symptoms so likely UTI at this time. Patient initially received IV fluid bolus, vancomycin, cefepime, and azithromycin initially.  Urine culture shows more  than 100,000 colonies of E. coli.  Sensitive to multiple antibiotics.  Procalcitonin of 0.8.  Blood cultures negative so far.  Lactic acidosis has normalized at this time.  Patient has been seen by infectious disease.  Has been on cefazolin at this time.  Continue for now.  Chronic systolic heart failure.  Last 2D echocardiogram noted EF of 25 to 30% with left ventricular diffuse hypokinesis in 2020.  Continue strict intake and output charting, daily weights.  History of atrial fibrillation/History of ventricular tachycardia:  History of AICD in the past which was explanted due to endocarditis.  Currently on amiodarone.  Hypokalemia. We will replenish.  Magnesium 2.0, check BMP in a.m.  Chronic elevation of troponin. High-sensitivity troponin 22.  Likely secondary to demand ischemia.Marland Kitchen   History of chronic cholecystitis/ history of recurrent cholangitis: With elevated bilirubin.  On cholecystostomy tube since patient is not a surgical candidate.  Resolved.   History of COVID: COVID test was negative at this time.  History of dementia debility, bedbound status/nonverbal:  Since 2020.  Continue supportive care. Palliative care has been consulted to address ongoing issues..    S/p TAVR in 2019   History of prostate cancer status post cryotherapy  DVT prophylaxis: enoxaparin (LOVENOX) injection 40 mg Start: 06/21/21 2200  Code Status: Partial code, okay for intubation and BiPAP. Overall prognosis appears poor overall.   Family Communication:  I tried to call the patient's spouse on the phone multiple times today but was unable to reach her.  Status is: Inpatient  Remains inpatient appropriate because:Unsafe d/c plan, IV treatments appropriate due to intensity of illness or inability to take PO, and Inpatient level of care  appropriate due to severity of illness  Dispo: The patient is from: Home              Anticipated d/c is to: Likely to skilled nursing facility.  Unknown at this  time.              Patient currently is not medically stable to d/c.   Difficult to place patient No   Consultants: Infectious disease Palliative care  Procedures: None so far  Anti-infectives:  Cefazolin 06/22/2021> Metronidazole 06/22/2021>  Anti-infectives (From admission, onward)    Start     Dose/Rate Route Frequency Ordered Stop   06/22/21 1400  ceFAZolin (ANCEF) IVPB 1 g/50 mL premix        1 g 100 mL/hr over 30 Minutes Intravenous Every 8 hours 06/22/21 1005     06/22/21 1130  metroNIDAZOLE (FLAGYL) IVPB 500 mg        500 mg 100 mL/hr over 60 Minutes Intravenous Every 12 hours 06/22/21 1031 06/25/21 1114   06/22/21 1115  metroNIDAZOLE (FLAGYL) IVPB 500 mg  Status:  Discontinued        500 mg 100 mL/hr over 60 Minutes Intravenous Every 8 hours 06/22/21 1029 06/22/21 1031   06/22/21 1000  vancomycin (VANCOREADY) IVPB 1250 mg/250 mL  Status:  Discontinued        1,250 mg 166.7 mL/hr over 90 Minutes Intravenous Every 24 hours 06/21/21 1234 06/22/21 1005   06/21/21 2300  ceFEPIme (MAXIPIME) 2 g in sodium chloride 0.9 % 100 mL IVPB  Status:  Discontinued        2 g 200 mL/hr over 30 Minutes Intravenous Every 12 hours 06/21/21 1234 06/22/21 1005   06/21/21 1115  vancomycin (VANCOREADY) IVPB 1500 mg/300 mL        1,500 mg 150 mL/hr over 120 Minutes Intravenous  Once 06/21/21 1108 06/21/21 1441   06/21/21 1115  ceFEPIme (MAXIPIME) 2 g in sodium chloride 0.9 % 100 mL IVPB        2 g 200 mL/hr over 30 Minutes Intravenous  Once 06/21/21 1108 06/21/21 1221   06/21/21 1115  azithromycin (ZITHROMAX) 500 mg in sodium chloride 0.9 % 250 mL IVPB        500 mg 250 mL/hr over 60 Minutes Intravenous  Once 06/21/21 1108 06/21/21 1328      Subjective: Today, patient was seen and examined at bedside.  Nonverbal.    Objective: Vitals:   06/23/21 0816 06/23/21 1150  BP: 100/67 131/66  Pulse: 88 95  Resp: 20 20  Temp: 98.6 F (37 C) 98.8 F (37.1 C)  SpO2: 97% 100%     Intake/Output Summary (Last 24 hours) at 06/23/2021 1339 Last data filed at 06/23/2021 0858 Gross per 24 hour  Intake 424.96 ml  Output 520 ml  Net -95.04 ml    Filed Weights   06/22/21 0447 06/23/21 0028  Weight: 89.8 kg 91.6 kg   Body mass index is 28.98 kg/m.   Physical Exam: GENERAL: Patient is nonverbal, does not follow commands, opens eyes spontaneously HENT: No scleral pallor or icterus. Pupils equally reactive to light. Oral mucosa is moist NECK: is supple, no gross swelling noted. CHEST: Clear to auscultation. No crackles or wheezes.  Diminished breath sounds bilaterally. CVS: S1 and S2 heard, no murmur.  Irregular heart rate ABDOMEN: Soft, non-tender, bowel sounds are present.  Biliary drain in the right abdomen.  Foley catheter in place  EXTREMITIES: No edema.  Moves extremities spontaneously CNS: Nonverbal.  Flexes  on painful stimuli SKIN: warm and dry  Data Review: I have personally reviewed the following laboratory data and studies,  CBC: Recent Labs  Lab 06/21/21 1107 06/21/21 1113 06/22/21 0519 06/23/21 0343  WBC 17.8*  --  15.8* 10.3  NEUTROABS 15.7*  --   --   --   HGB 14.2 13.6 13.3 11.9*  HCT 41.6 40.0 37.8* 34.7*  MCV 99.8  --  97.9 98.3  PLT 139*  --  118* 117*    Basic Metabolic Panel: Recent Labs  Lab 06/21/21 1107 06/21/21 1113 06/22/21 0519 06/23/21 0343  NA 135 137 138 138  K 4.3 4.3 3.7 3.3*  CL 103 104 105 109  CO2 22  --  22 23  GLUCOSE 191* 191* 143* 119*  BUN '21 22 23 23  '$ CREATININE 1.11 1.00 0.96 0.83  CALCIUM 8.6*  --  9.0 8.1*  MG 2.2  --   --  2.0  PHOS  --   --   --  2.7    Liver Function Tests: Recent Labs  Lab 06/21/21 1107 06/22/21 0519 06/23/21 0343  AST 17  --  13*  ALT 10  --  9  ALKPHOS 106  --  73  BILITOT 2.2* 1.7* 0.9  PROT 7.0  --  5.6*  ALBUMIN 3.0*  --  2.2*    No results for input(s): LIPASE, AMYLASE in the last 168 hours. No results for input(s): AMMONIA in the last 168 hours. Cardiac  Enzymes: No results for input(s): CKTOTAL, CKMB, CKMBINDEX, TROPONINI in the last 168 hours. BNP (last 3 results) Recent Labs    06/21/21 1107  BNP 208.9*     ProBNP (last 3 results) No results for input(s): PROBNP in the last 8760 hours.  CBG: No results for input(s): GLUCAP in the last 168 hours. Recent Results (from the past 240 hour(s))  Blood Culture (routine x 2)     Status: None (Preliminary result)   Collection Time: 06/21/21  9:45 AM   Specimen: Left Antecubital; Blood  Result Value Ref Range Status   Specimen Description LEFT ANTECUBITAL  Final   Special Requests   Final    BOTTLES DRAWN AEROBIC AND ANAEROBIC Blood Culture results may not be optimal due to an inadequate volume of blood received in culture bottles   Culture   Final    NO GROWTH < 24 HOURS Performed at [facility], [place] [address] [place], [place] [address]    Report Status PENDING  Incomplete  Resp Panel by RT-PCR (Flu A&B, Covid) Nasopharyngeal Swab     Status: None   Collection Time: 06/21/21 10:07 AM   Specimen: Nasopharyngeal Swab; Nasopharyngeal(NP) swabs in vial transport medium  Result Value Ref Range Status   SARS Coronavirus 2 by RT PCR NEGATIVE NEGATIVE Final    Comment: (NOTE) SARS-CoV-2 target nucleic acids are NOT DETECTED.  The SARS-CoV-2 RNA is generally detectable in upper respiratory specimens during the acute phase of infection. The lowest concentration of SARS-CoV-2 viral copies this assay can detect is 138 copies/mL. A negative result does not preclude SARS-Cov-2 infection and should not be used as the sole basis for treatment or other patient management decisions. A negative result may occur with  improper specimen collection/handling, submission of specimen other than nasopharyngeal swab, presence of viral mutation(s) within the areas targeted by this assay, and inadequate number of viral copies(<138 copies/mL). A negative result must be combined with clinical  observations, patient history, and epidemiological information. The  expected result is Negative.  Fact Sheet for Patients:  EntrepreneurPulse.com.au  Fact Sheet for Healthcare Providers:  IncredibleEmployment.be  This test is no t yet approved or cleared by the [place] FDA and  has been authorized for detection and/or diagnosis of SARS-CoV-2 by FDA under an Emergency Use Authorization (EUA). This EUA will remain  in effect (meaning this test can be used) for the duration of the COVID-19 declaration under Section 564(b)(1) of the Act, 21 U.S.C.section 360bbb-3(b)(1), unless the authorization is terminated  or revoked sooner.       Influenza A by PCR NEGATIVE NEGATIVE Final   Influenza B by PCR NEGATIVE NEGATIVE Final    Comment: (NOTE) The Xpert Xpress SARS-CoV-2/FLU/RSV plus assay is intended as an aid in the diagnosis of influenza from Nasopharyngeal swab specimens and should not be used as a sole basis for treatment. Nasal washings and aspirates are unacceptable for Xpert Xpress SARS-CoV-2/FLU/RSV testing.  Fact Sheet for Patients: EntrepreneurPulse.com.au  Fact Sheet for Healthcare Providers: IncredibleEmployment.be  This test is not yet approved or cleared by the [place] FDA and has been authorized for detection and/or diagnosis of SARS-CoV-2 by FDA under an Emergency Use Authorization (EUA). This EUA will remain in effect (meaning this test can be used) for the duration of the COVID-19 declaration under Section 564(b)(1) of the Act, 21 U.S.C. section 360bbb-3(b)(1), unless the authorization is terminated or revoked.  Performed at [facility], [place] [address], [place], [place] [address]   Blood Culture (routine x 2)     Status: None (Preliminary result)   Collection Time: 06/21/21 11:00 AM   Specimen: Right Antecubital; Blood  Result Value Ref Range Status   Specimen  Description RIGHT ANTECUBITAL  Final   Special Requests   Final    BOTTLES DRAWN AEROBIC AND ANAEROBIC Blood Culture adequate volume   Culture   Final    NO GROWTH < 24 HOURS Performed at [facility], [place] [address], [place], [place] [address]    Report Status PENDING  Incomplete  Urine Culture     Status: Abnormal   Collection Time: 06/21/21 11:27 AM   Specimen: In/Out Cath Urine  Result Value Ref Range Status   Specimen Description IN/OUT CATH URINE  Final   Special Requests   Final    NONE Performed at [facility], [place] [address] [place], [place] [address]    Culture >=100,000 COLONIES/mL ESCHERICHIA COLI (A)  Final   Report Status 06/23/2021 FINAL  Final   Organism ID, Bacteria ESCHERICHIA COLI (A)  Final      Susceptibility   Escherichia coli - MIC*    AMPICILLIN <=2 SENSITIVE Sensitive     CEFAZOLIN <=4 SENSITIVE Sensitive     CEFEPIME <=0.12 SENSITIVE Sensitive     CEFTRIAXONE <=0.25 SENSITIVE Sensitive     CIPROFLOXACIN <=0.25 SENSITIVE Sensitive     GENTAMICIN <=1 SENSITIVE Sensitive     IMIPENEM <=0.25 SENSITIVE Sensitive     NITROFURANTOIN <=16 SENSITIVE Sensitive     TRIMETH/SULFA <=20 SENSITIVE Sensitive     AMPICILLIN/SULBACTAM <=2 SENSITIVE Sensitive     PIP/TAZO <=4 SENSITIVE Sensitive     * >=100,000 COLONIES/mL ESCHERICHIA COLI  MRSA Next Gen by PCR, Nasal     Status: Abnormal   Collection Time: 06/21/21 12:35 PM   Specimen: Nasal Mucosa; Nasal Swab  Result Value Ref Range Status   MRSA by PCR Next Gen DETECTED (A) NOT DETECTED Final    Comment: RESULT  CALLED TO, READ BACK BY AND VERIFIED WITH: RN Nicola Girt [id number] (NOTE) The GeneXpert MRSA Assay (FDA approved for NASAL specimens only), is one component of a comprehensive MRSA colonization surveillance program. It is not intended to diagnose MRSA infection nor to guide or monitor treatment for MRSA infections. Test performance is not FDA approved in patients less than 9  years old. Performed at [facility], [place] [address], [place], [place] [address]       Studies: No results found.    Flora Lipps, MD  [facility] 06/23/2021  If 7PM-7AM, please contact night-coverage

## 2021-06-24 DIAGNOSIS — A419 Sepsis, unspecified organism: Secondary | ICD-10-CM | POA: Diagnosis not present

## 2021-06-24 DIAGNOSIS — J189 Pneumonia, unspecified organism: Secondary | ICD-10-CM | POA: Diagnosis not present

## 2021-06-24 DIAGNOSIS — R5381 Other malaise: Secondary | ICD-10-CM | POA: Diagnosis not present

## 2021-06-24 DIAGNOSIS — N390 Urinary tract infection, site not specified: Secondary | ICD-10-CM | POA: Diagnosis not present

## 2021-06-24 LAB — CBC
HCT: 34.5 % — ABNORMAL LOW (ref 39.0–52.0)
Hemoglobin: 12.1 g/dL — ABNORMAL LOW (ref 13.0–17.0)
MCH: 34.8 pg — ABNORMAL HIGH (ref 26.0–34.0)
MCHC: 35.1 g/dL (ref 30.0–36.0)
MCV: 99.1 fL (ref 80.0–100.0)
Platelets: 127 10*3/uL — ABNORMAL LOW (ref 150–400)
RBC: 3.48 MIL/uL — ABNORMAL LOW (ref 4.22–5.81)
RDW: 15.2 % (ref 11.5–15.5)
WBC: 7.4 10*3/uL (ref 4.0–10.5)
nRBC: 0 % (ref 0.0–0.2)

## 2021-06-24 LAB — BASIC METABOLIC PANEL
Anion gap: 6 (ref 5–15)
BUN: 20 mg/dL (ref 8–23)
CO2: 23 mmol/L (ref 22–32)
Calcium: 7.9 mg/dL — ABNORMAL LOW (ref 8.9–10.3)
Chloride: 111 mmol/L (ref 98–111)
Creatinine, Ser: 0.87 mg/dL (ref 0.61–1.24)
GFR, Estimated: >60 mL/min (ref >60)
Glucose, Bld: 121 mg/dL — ABNORMAL HIGH (ref 70–99)
Potassium: 3.3 mmol/L — ABNORMAL LOW (ref 3.5–5.1)
Sodium: 140 mmol/L (ref 135–145)

## 2021-06-24 LAB — MAGNESIUM: Magnesium: 2.1 mg/dL (ref 1.7–2.4)

## 2021-06-24 MED ORDER — POTASSIUM CHLORIDE 10 MEQ/100ML IV SOLN
10.0000 meq | INTRAVENOUS | Status: AC
  Administered 2021-06-24 (×4): 10 meq via INTRAVENOUS
  Filled 2021-06-24 (×4): qty 100

## 2021-06-24 NOTE — Progress Notes (Signed)
PROGRESS NOTE  William [name] [id number] DOB: 14-Feb-1928 DOA: 06/21/2021 PCP: Haywood Pao, MD   LOS: 3 days   Brief narrative:  William [name] is a 87 y.o. 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  Patient is nonverbal and bedbound for the last 2 years.  Patient does need assistance with daily living.  He has been having a lot of phlegm but not much cough.  EMS was called in.  In the ED, patient was noted to be afebrile with tachypnea.  Leukocytosis was present and lactic acid was elevated.  Chest x-ray noted low lung volumes with similar streaky bibasilar opacities thought atelectasis versus aspiration and/or pneumonia.  Urinalysis positive for large leukocytes, many bacteria, 1-50 RBCs/hpf, and greater than 50 WBCs. Influenza and COVID-19 screening was negative.  Patient was given 500 mL of normal saline IV fluids, cefepime, vancomycin, and azithromycin.  Patient was then admitted hospital for further evaluation and treatment.   Assessment/Plan:  Principal Problem:   Sepsis ([facility]) Active Problems:   Prostate cancer ([place])   CAP (community acquired pneumonia)   Dementia ([place])   S/P TAVR (transcatheter aortic valve replacement)   Debilitated patient   History of ventricular tachycardia   Acute lower UTI  Sepsis secondary to E. coli urinary tract infection  Was seen by infectious disease.  Currently on cefazolin.  Patient is n.p.o. so we will continue for now.    Metabolic encephalopathy. Wife at bedside. She states that he closes eyes but can stili eat with assistance.  Chronic systolic heart failure.  Last 2D echocardiogram noted EF of 25  to 30% with left ventricular diffuse hypokinesis in 2020.  Continue strict intake and output charting, daily weights.  History of atrial fibrillation/History of ventricular tachycardia:  History of AICD in the past which was explanted due to endocarditis.  Currently on amiodarone.  Hypokalemia. Potassium of 3.3.  We will continue to replenish through IV..  Magnesium 2.0, check BMP in a.m.  Chronic elevation of troponin. High-sensitivity troponin 22.  Likely secondary to demand ischemia.Marland Kitchen   History of chronic cholecystitis/ history of recurrent cholangitis: With elevated bilirubin.  On cholecystostomy tube since patient is not a surgical candidate.  Resolved.   History of COVID: COVID test was negative at this time.  History of dementia debility, bedbound status/nonverbal:  Since 2020.  Continue supportive care. Palliative care has been consulted to address ongoing issues..    S/p TAVR in 2019   History of prostate cancer status post cryotherapy  DVT prophylaxis: enoxaparin (LOVENOX) injection 40 mg Start: 06/21/21 2200  Code Status: Partial code, okay for intubation and BiPAP. Overall prognosis appears poor.  Family Communication:  I spoke with the  spouse at bedside.  Status is: Inpatient  Remains inpatient appropriate because:Unsafe d/c plan, IV treatments appropriate due to intensity of illness or inability to take PO, and Inpatient level of care appropriate due to severity of illness  Dispo: The patient is from: Home              Anticipated d/c is to: Likely to skilled nursing facility.  Unknown at this time.              Patient currently is not medically  stable to d/c.   Difficult to place patient No  Consultants: Infectious disease Palliative care  Procedures: None so far  Anti-infectives:  Cefazolin 06/22/2021> Metronidazole 06/22/2021>  Anti-infectives (From admission, onward)    Start     Dose/Rate Route Frequency Ordered Stop   06/22/21 1400  ceFAZolin  (ANCEF) IVPB 1 g/50 mL premix        1 g 100 mL/hr over 30 Minutes Intravenous Every 8 hours 06/22/21 1005     06/22/21 1130  metroNIDAZOLE (FLAGYL) IVPB 500 mg        500 mg 100 mL/hr over 60 Minutes Intravenous Every 12 hours 06/22/21 1031 06/25/21 1114   06/22/21 1115  metroNIDAZOLE (FLAGYL) IVPB 500 mg  Status:  Discontinued        500 mg 100 mL/hr over 60 Minutes Intravenous Every 8 hours 06/22/21 1029 06/22/21 1031   06/22/21 1000  vancomycin (VANCOREADY) IVPB 1250 mg/250 mL  Status:  Discontinued        1,250 mg 166.7 mL/hr over 90 Minutes Intravenous Every 24 hours 06/21/21 1234 06/22/21 1005   06/21/21 2300  ceFEPIme (MAXIPIME) 2 g in sodium chloride 0.9 % 100 mL IVPB  Status:  Discontinued        2 g 200 mL/hr over 30 Minutes Intravenous Every 12 hours 06/21/21 1234 06/22/21 1005   06/21/21 1115  vancomycin (VANCOREADY) IVPB 1500 mg/300 mL        1,500 mg 150 mL/hr over 120 Minutes Intravenous  Once 06/21/21 1108 06/21/21 1441   06/21/21 1115  ceFEPIme (MAXIPIME) 2 g in sodium chloride 0.9 % 100 mL IVPB        2 g 200 mL/hr over 30 Minutes Intravenous  Once 06/21/21 1108 06/21/21 1221   06/21/21 1115  azithromycin (ZITHROMAX) 500 mg in sodium chloride 0.9 % 250 mL IVPB        500 mg 250 mL/hr over 60 Minutes Intravenous  Once 06/21/21 1108 06/21/21 1328      Subjective: Today, wife at bedside. No interval complains. She is concerned about food. Non verbal  Objective: Vitals:   06/23/21 2035 06/24/21 0416  BP: 119/82 123/74  Pulse: 85 80  Resp: 17 (!) 24  Temp: 97.6 F (36.4 C) 97.6 F (36.4 C)  SpO2: 98% 96%    Intake/Output Summary (Last 24 hours) at 06/24/2021 0837 Last data filed at 06/24/2021 0833 Gross per 24 hour  Intake 303 ml  Output 785 ml  Net -482 ml    Filed Weights   06/22/21 0447 06/23/21 0028 06/24/21 0415  Weight: 89.8 kg 91.6 kg 90 kg   Body mass index is 28.47 kg/m.   Physical Exam: General:  Average built, not in obvious distress,  nonverbal, does not follow command, opens eyes spontaneously HENT:   No scleral pallor or icterus noted. Oral mucosa is moist.  Chest:  Clear breath sounds.  Diminished breath sounds bilaterally. No crackles or wheezes.  CVS: S1 &S2 heard. No murmur.  Regular rate and rhythm. Abdomen: Soft, nontender, nondistended.  Bowel sounds are heard.  Biliary drain in the right abdomen Extremities: No cyanosis, clubbing or edema.  Peripheral pulses are palpable. Psych: Nonverbal CNS: Nonverbal, flexes on painful stimuli, Skin: Warm and dry.  No rashes noted.  Data Review: I have personally reviewed the following laboratory data and studies,  CBC: Recent Labs  Lab 06/21/21 1107 06/21/21 1113 06/22/21 0519 06/23/21 0343 06/24/21 0146  WBC 17.8*  --  15.8* 10.3 7.4  NEUTROABS 15.7*  --   --   --   --  HGB 14.2 13.6 13.3 11.9* 12.1*  HCT 41.6 40.0 37.8* 34.7* 34.5*  MCV 99.8  --  97.9 98.3 99.1  PLT 139*  --  118* 117* 127*    Basic Metabolic Panel: Recent Labs  Lab 06/21/21 1107 06/21/21 1113 06/22/21 0519 06/23/21 0343 06/24/21 0146  NA 135 137 138 138 140  K 4.3 4.3 3.7 3.3* 3.3*  CL 103 104 105 109 111  CO2 22  --  '22 23 23  '$ GLUCOSE 191* 191* 143* 119* 121*  BUN '21 22 23 23 20  '$ CREATININE 1.11 1.00 0.96 0.83 0.87  CALCIUM 8.6*  --  9.0 8.1* 7.9*  MG 2.2  --   --  2.0 2.1  PHOS  --   --   --  2.7  --     Liver Function Tests: Recent Labs  Lab 06/21/21 1107 06/22/21 0519 06/23/21 0343  AST 17  --  13*  ALT 10  --  9  ALKPHOS 106  --  73  BILITOT 2.2* 1.7* 0.9  PROT 7.0  --  5.6*  ALBUMIN 3.0*  --  2.2*    No results for input(s): LIPASE, AMYLASE in the last 168 hours. No results for input(s): AMMONIA in the last 168 hours. Cardiac Enzymes: No results for input(s): CKTOTAL, CKMB, CKMBINDEX, TROPONINI in the last 168 hours. BNP (last 3 results) Recent Labs    06/21/21 1107  BNP 208.9*     ProBNP (last 3 results) No results for input(s): PROBNP in the last  8760 hours.  CBG: No results for input(s): GLUCAP in the last 168 hours. Recent Results (from the past 240 hour(s))  Blood Culture (routine x 2)     Status: None (Preliminary result)   Collection Time: 06/21/21  9:45 AM   Specimen: Left Antecubital; Blood  Result Value Ref Range Status   Specimen Description LEFT ANTECUBITAL  Final   Special Requests   Final    BOTTLES DRAWN AEROBIC AND ANAEROBIC Blood Culture results may not be optimal due to an inadequate volume of blood received in culture bottles   Culture   Final    NO GROWTH 2 DAYS Performed at [facility] [address] [place], [place] [address]    Report Status PENDING  Incomplete  Resp Panel by RT-PCR (Flu A&B, Covid) Nasopharyngeal Swab     Status: None   Collection Time: 06/21/21 10:07 AM   Specimen: Nasopharyngeal Swab; Nasopharyngeal(NP) swabs in vial transport medium  Result Value Ref Range Status   SARS Coronavirus 2 by RT PCR NEGATIVE NEGATIVE Final    Comment: (NOTE) SARS-CoV-2 target nucleic acids are NOT DETECTED.  The SARS-CoV-2 RNA is generally detectable in upper respiratory specimens during the acute phase of infection. The lowest concentration of SARS-CoV-2 viral copies this assay can detect is 138 copies/mL. A negative result does not preclude SARS-Cov-2 infection and should not be used as the sole basis for treatment or other patient management decisions. A negative result may occur with  improper specimen collection/handling, submission of specimen other than nasopharyngeal swab, presence of viral mutation(s) within the areas targeted by this assay, and inadequate number of viral copies(<138 copies/mL). A negative result must be combined with clinical observations, patient history, and epidemiological information. The expected result is Negative.  Fact Sheet for Patients:  EntrepreneurPulse.com.au  Fact Sheet for Healthcare Providers:   IncredibleEmployment.be  This test is no t yet approved or cleared by the [place] FDA and  has been  authorized for detection and/or diagnosis of SARS-CoV-2 by FDA under an Emergency Use Authorization (EUA). This EUA will remain  in effect (meaning this test can be used) for the duration of the COVID-19 declaration under Section 564(b)(1) of the Act, 21 U.S.C.section 360bbb-3(b)(1), unless the authorization is terminated  or revoked sooner.       Influenza A by PCR NEGATIVE NEGATIVE Final   Influenza B by PCR NEGATIVE NEGATIVE Final    Comment: (NOTE) The Xpert Xpress SARS-CoV-2/FLU/RSV plus assay is intended as an aid in the diagnosis of influenza from Nasopharyngeal swab specimens and should not be used as a sole basis for treatment. Nasal washings and aspirates are unacceptable for Xpert Xpress SARS-CoV-2/FLU/RSV testing.  Fact Sheet for Patients: EntrepreneurPulse.com.au  Fact Sheet for Healthcare Providers: IncredibleEmployment.be  This test is not yet approved or cleared by the [place] FDA and has been authorized for detection and/or diagnosis of SARS-CoV-2 by FDA under an Emergency Use Authorization (EUA). This EUA will remain in effect (meaning this test can be used) for the duration of the COVID-19 declaration under Section 564(b)(1) of the Act, 21 U.S.C. section 360bbb-3(b)(1), unless the authorization is terminated or revoked.  Performed at [facility], [place] [address] [place], [place] [address]   Blood Culture (routine x 2)     Status: None (Preliminary result)   Collection Time: 06/21/21 11:00 AM   Specimen: Right Antecubital; Blood  Result Value Ref Range Status   Specimen Description RIGHT ANTECUBITAL  Final   Special Requests   Final    BOTTLES DRAWN AEROBIC AND ANAEROBIC Blood Culture adequate volume   Culture   Final    NO GROWTH 2 DAYS Performed at [facility], [place] [address], [place], [place] [address]    Report Status PENDING  Incomplete  Urine Culture     Status: Abnormal   Collection Time: 06/21/21 11:27 AM   Specimen: In/Out Cath Urine  Result Value Ref Range Status   Specimen Description IN/OUT CATH URINE  Final   Special Requests   Final    NONE Performed at [facility], [place] [address] [place], [place] [address]    Culture >=100,000 COLONIES/mL ESCHERICHIA COLI (A)  Final   Report Status 06/23/2021 FINAL  Final   Organism ID, Bacteria ESCHERICHIA COLI (A)  Final      Susceptibility   Escherichia coli - MIC*    AMPICILLIN <=2 SENSITIVE Sensitive     CEFAZOLIN <=4 SENSITIVE Sensitive     CEFEPIME <=0.12 SENSITIVE Sensitive     CEFTRIAXONE <=0.25 SENSITIVE Sensitive     CIPROFLOXACIN <=0.25 SENSITIVE Sensitive     GENTAMICIN <=1 SENSITIVE Sensitive     IMIPENEM <=0.25 SENSITIVE Sensitive     NITROFURANTOIN <=16 SENSITIVE Sensitive     TRIMETH/SULFA <=20 SENSITIVE Sensitive     AMPICILLIN/SULBACTAM <=2 SENSITIVE Sensitive     PIP/TAZO <=4 SENSITIVE Sensitive     * >=100,000 COLONIES/mL ESCHERICHIA COLI  MRSA Next Gen by PCR, Nasal     Status: Abnormal   Collection Time: 06/21/21 12:35 PM   Specimen: Nasal Mucosa; Nasal Swab  Result Value Ref Range Status   MRSA by PCR Next Gen DETECTED (A) NOT DETECTED Final    Comment: RESULT CALLED TO, READ BACK BY AND VERIFIED WITH: RN Nicola Girt [phone] MLM (NOTE) The GeneXpert MRSA Assay (FDA approved for NASAL specimens only), is one component of a comprehensive MRSA colonization surveillance program. It is not intended  to diagnose MRSA infection nor to guide or monitor treatment for MRSA infections. Test performance is not FDA approved in patients less than 69 years old. Performed at [facility], [place] [address], [place], [place] [address]       Studies: No results found.    Flora Lipps, MD  [facility] 06/24/2021  If 7PM-7AM, please contact  night-coverage

## 2021-06-24 NOTE — Progress Notes (Signed)
  Speech Language Pathology Treatment: Dysphagia  Patient Details Name: William [name] MRN: [id number] DOB: [birth year] Today's Date: 06/24/2021 Time: [id number] SLP Time Calculation (min) (ACUTE ONLY): 42 min  Assessment / Plan / Recommendation Clinical Impression  Pt was seen for dysphagia treatment with his wife present. He was adequately alert, but kept his eyes closed for the majority of the session, which pt's wife reported is his baseline. Pt was fed mostly by his wife per her request to demonstrate the feeding method used at home. Pt was given ice chips to initially stimulate his swallow. Ice chips were readily masticated and swallowed without overt s/sx of aspiration. Puree boluses, thin liquids via tsp, and regular texture solids were administered with additional ice chips every 3-4 boluses to facilitate continued bolus awareness and acceptance of solids. No s/sx of aspiration were noted with puree solids or thin liquids. A single cough was noted towards the end of intake of regular texture solids after consumption of a full packet of graham crackers. Pt's wife described this as throat clearing and reported that this is his baseline even outside of meals. Mastication was adequate with regular texture solids and oral clearance functional. Anterior spillage was intermittently noted. A dysphagia 2 diet with thin liquids will be initiated at this time with strict observance of swallowing precautions. SLP will follow to assess diet tolerance and for instrumental assessment if clinically indicated. Pt's wife was educated regarding recommendations and verbalized agreement.    HPI HPI: 87 y.o. male brought to ED by EMS from home with UTI, possible pna. 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 Pt non-verbal, bedbound at baseline. Normally eats regular diet.  Apparently the patient had been in his normal state of health yesterday and his nurse normally gets him up with [facility] lift for couple hours a day.  His wife notes that he has fragile skin, but no bedsores to her knowledge.  He has been eating regular diet with assistance pta. Had clinical swallow eval in Feb 2020 after a brief intubation, swallowing dysfunction was transient and resolved prior to discharge.      SLP Plan  Continue with current plan of care       Recommendations  Diet recommendations: Dysphagia 2 (fine chop);Thin liquid Liquids provided via: Teaspoon Medication Administration: Crushed with puree Supervision: Full supervision/cueing for compensatory strategies;Trained caregiver to feed patient Compensations: Slow rate;Small sips/bites;Minimize environmental distractions (ice chips every 2-3 bites) Postural Changes and/or Swallow Maneuvers: Seated upright 90 degrees                Oral Care Recommendations: Oral care QID Follow up Recommendations: Other (comment) (TBD) SLP Visit Diagnosis: Dysphagia, unspecified (R13.10) Plan: Continue with current plan of care       Ayonna Speranza I. Hardin Negus, [place], [place] Office number [phone] Pager [place] 06/24/2021, 5:30 PM

## 2021-06-25 DIAGNOSIS — J189 Pneumonia, unspecified organism: Secondary | ICD-10-CM | POA: Diagnosis not present

## 2021-06-25 DIAGNOSIS — R5381 Other malaise: Secondary | ICD-10-CM | POA: Diagnosis not present

## 2021-06-25 DIAGNOSIS — N390 Urinary tract infection, site not specified: Secondary | ICD-10-CM | POA: Diagnosis not present

## 2021-06-25 DIAGNOSIS — A419 Sepsis, unspecified organism: Secondary | ICD-10-CM | POA: Diagnosis not present

## 2021-06-25 LAB — BASIC METABOLIC PANEL
Anion gap: 7 (ref 5–15)
BUN: 19 mg/dL (ref 8–23)
CO2: 23 mmol/L (ref 22–32)
Calcium: 7.9 mg/dL — ABNORMAL LOW (ref 8.9–10.3)
Chloride: 108 mmol/L (ref 98–111)
Creatinine, Ser: 0.89 mg/dL (ref 0.61–1.24)
GFR, Estimated: >60 mL/min (ref >60)
Glucose, Bld: 107 mg/dL — ABNORMAL HIGH (ref 70–99)
Potassium: 3.6 mmol/L (ref 3.5–5.1)
Sodium: 138 mmol/L (ref 135–145)

## 2021-06-25 LAB — CBC
HCT: 36 % — ABNORMAL LOW (ref 39.0–52.0)
Hemoglobin: 12.4 g/dL — ABNORMAL LOW (ref 13.0–17.0)
MCH: 34.2 pg — ABNORMAL HIGH (ref 26.0–34.0)
MCHC: 34.4 g/dL (ref 30.0–36.0)
MCV: 99.2 fL (ref 80.0–100.0)
Platelets: 129 10*3/uL — ABNORMAL LOW (ref 150–400)
RBC: 3.63 MIL/uL — ABNORMAL LOW (ref 4.22–5.81)
RDW: 14.9 % (ref 11.5–15.5)
WBC: 6.1 10*3/uL (ref 4.0–10.5)
nRBC: 0 % (ref 0.0–0.2)

## 2021-06-25 LAB — MAGNESIUM: Magnesium: 2 mg/dL (ref 1.7–2.4)

## 2021-06-25 MED ORDER — CEPHALEXIN 500 MG PO CAPS: 500.0000 mg | ORAL_CAPSULE | Freq: Three times a day (TID) | ORAL | 0 refills | Status: AC

## 2021-06-25 MED ORDER — CEPHALEXIN 500 MG PO CAPS: 500.0000 mg | ORAL_CAPSULE | Freq: Two times a day (BID) | ORAL | 0 refills | Status: DC

## 2021-06-25 NOTE — Progress Notes (Signed)
  Speech Language Pathology Treatment: Dysphagia  Patient Details Name: William [name] MRN: [id number] DOB: [birth year] Today's Date: 06/25/2021 Time: 1050-1100 SLP Time Calculation (min) (ACUTE ONLY): 10 min  Assessment / Plan / Recommendation Clinical Impression  Pt observed with wife spoon feeding miralax and pudding, intermittently offering ice to trigger swallow and ongoing acceptance. Pt was noted to do some brief oral holding, but no signs of aspiration seen after swallows were eventually triggered with ice. Pt likely at his baseline and has been able to eat and drink to wife's expectations today. Recommend pt continue home diet. Risk of aspiration is ongoing given pts cognitive status, but wife is practicing careful hand feeding and appropriate precautions. No f/u needed.   HPI HPI: 87 y.o. male brought to ED by EMS from home with UTI, possible pna. 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 Pt non-verbal, bedbound at baseline. Normally eats regular diet.  Apparently the patient had been in his normal state of health yesterday and his nurse normally gets him up with [facility] lift for couple hours a day.  His wife notes that he has fragile skin, but no bedsores to her knowledge.  He has been eating regular diet with assistance pta. Had clinical swallow eval in Feb 2020 after a brief intubation, swallowing dysfunction was transient and resolved prior to discharge.      SLP Plan  All goals met       Recommendations  Diet recommendations: Dysphagia 2 (fine chop);Thin liquid Liquids provided via: Teaspoon Medication Administration: Crushed with puree Supervision: Full supervision/cueing for compensatory strategies;Trained caregiver to feed patient Compensations: Slow  rate;Small sips/bites;Minimize environmental distractions Postural Changes and/or Swallow Maneuvers: Seated upright 90 degrees                Oral Care Recommendations: Oral care QID Follow up Recommendations: Other (comment) SLP Visit Diagnosis: Dysphagia, unspecified (R13.10) Plan: All goals met       GO                Deloros Beretta, Katherene Ponto 06/25/2021, 12:36 PM

## 2021-06-25 NOTE — TOC Transition Note (Signed)
Transition of Care [facility]) - CM/SW Discharge Note   Patient Details  Name: William [name] MRN: [id number] Date of Birth: April 05, 1928  Transition of Care [facility] CM/SW Contact:  Zenon Mayo, RN Phone Number: 06/25/2021, 12:54 PM   Clinical Narrative:    Patient is for dc today, he is set up with [facility] for [place].     Final next level of care: [place] Barriers to Discharge: No Barriers Identified   Patient Goals and CMS Choice Patient states their goals for this hospitalization and ongoing recovery are:: return home with [facility] CMS Medicare.gov Compare Post Acute Care list provided to:: Patient Choice offered to / list presented to : Patient  Discharge Placement                       Discharge Plan and Services   Discharge Planning Services: CM Consult Post Acute Care Choice: Home Health            DME Agency: NA       HH Arranged: PT HH Agency: [facility] Date [place]: 06/22/21 Time [place]: [id number] Representative spoke with at [place]: [place] ([place]) Interventions     Readmission Risk Interventions No flowsheet data found.

## 2021-06-25 NOTE — Discharge Summary (Signed)
Physician Discharge Summary  William [name] [id number] DOB: 03-Jun-1928 DOA: 06/21/2021  PCP: Haywood Pao, MD  Admit date: 06/21/2021 Discharge date: 06/25/2021  Admitted From: Home  Discharge disposition: Home with home health  Recommendations for Outpatient Follow-Up:   Follow up with your primary care provider in one week.  Check CBC, BMP, magnesium in the next visit  Discharge Diagnosis:   Principal Problem:   Sepsis ([place]) Active Problems:   Prostate cancer ([place])   CAP (community acquired pneumonia)   Dementia ([place])   S/P TAVR (transcatheter aortic valve replacement)   Debilitated patient   History of ventricular tachycardia   Acute lower UTI   Discharge Condition: Improved.  Diet recommendation: Low sodium, heart healthy.    Wound care: None.  Code status: Full.   History of Present Illness:   William [name] is a 87 y.o. 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  Patient is nonverbal and bedbound for the last 2 years.  Patient does need assistance with daily living.  He has been having a lot of phlegm but not much cough.  EMS was called in.  In the ED, patient was noted to be afebrile with tachypnea.  Leukocytosis was present and lactic acid was elevated.  Chest x-ray noted low lung volumes with similar streaky bibasilar opacities thought atelectasis versus aspiration and/or pneumonia.  Urinalysis positive for large leukocytes, many bacteria, 1-50 RBCs/hpf, and greater than 50 WBCs. Influenza and COVID-19 screening was negative.  Patient was given 500 mL of normal saline IV fluids, cefepime, vancomycin, and azithromycin.   Patient was then admitted hospital for further evaluation and treatment.   Hospital Course:   Following conditions were addressed during hospitalization as listed below,  Sepsis secondary to E. coli urinary tract infection  Was seen by infectious disease.  Currently on cefazolin.  Will change to keflex to discharge. Dc foley catheter.   Metabolic encephalopathy. Improved.    Chronic systolic heart failure.  Last 2D echocardiogram noted EF of 25 to 30% with left ventricular diffuse hypokinesis in 2020.  Continue strict intake and output charting, daily weights.   History of atrial fibrillation/History of ventricular tachycardia:  History of AICD in the past which was explanted due to endocarditis.  Currently on amiodarone.   Hypokalemia. Potassium of 3.6. improved after replacement.   Chronic elevation of troponin. High-sensitivity troponin 22.  Likely secondary to demand ischemia.Marland Kitchen   History of chronic cholecystitis/ history of recurrent cholangitis:  With elevated bilirubin.  On cholecystostomy tube since patient is not a surgical candidate.  Resolved.   History of COVID: COVID test was negative at this time.   History of dementia debility, bedbound status/nonverbal:  Since 2020.  Continue supportive care. Palliative care has been consulted to address ongoing issues..    S/p TAVR in 2019   History of prostate cancer status post cryotherapy   Disposition.  At this time, patient is stable for disposition home with home health.  Medical Consultants:   Infectious disease Palliative care Procedures:    none Subjective:   Today, patient was seen and examined bedside.  Nonverbal.  Has been tolerating p.o. intake.  Discharge Exam:   Vitals:   06/25/21 0545 06/25/21 1137  BP: 111/71 115/82  Pulse: 85 94  Resp: 20 18  Temp: 98.6 F (37 C) (!) 97.2 F (36.2 C)  SpO2: 97% 95%   Vitals:   06/24/21 2011 06/25/21 0100 06/25/21 0545 06/25/21 1137  BP: 129/72  111/71  115/82  Pulse: 89  85 94  Resp: '20  20 18  '$ Temp: 98.6 F (37 C)  98.6 F (37 C) (!) 97.2 F (36.2 C)  TempSrc: Oral  Oral Oral  SpO2: 97%  97% 95%  Weight:  90.6 kg    Height:       General: Alert awake, not in obvious distress constantly closes eyes, nonverbal HENT: pupils equally reacting to light,  No scleral pallor or icterus noted. Oral mucosa is moist.  Chest:  Diminished breath sounds bilaterally. No crackles or wheezes.  CVS: S1 &S2 heard. No murmur.  Regular rate and rhythm. Abdomen: Soft, nontender, nondistended.  Bowel sounds are heard.   Extremities: No cyanosis, clubbing or edema.  Peripheral pulses are palpable. Psych: Nonverbal CNS: Nonverbal, moves extremities Skin: Warm and dry.  No rashes noted.  The results of significant diagnostics from this hospitalization (including imaging, microbiology, ancillary and laboratory) are listed below for reference.     Diagnostic Studies:   CT HEAD WO CONTRAST (5MM)  Result Date: 06/21/2021 CLINICAL DATA:  Altered mental status. EXAM: CT HEAD WITHOUT CONTRAST TECHNIQUE: Contiguous axial images were obtained from the base of the skull through the vertex without intravenous contrast. COMPARISON:  February 02, 2019. FINDINGS: Brain: Mild diffuse cortical atrophy. Mild chronic ischemic white matter disease. No mass effect or midline shift is noted. Ventricular size is within normal limits. There is no evidence of mass lesion, hemorrhage or acute infarction. 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 Skull: Normal. Negative for fracture or focal lesion. Sinuses/Orbits: No acute finding. Other: None. IMPRESSION: No acute intracranial abnormality seen. 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 Electronically Signed   By: Marijo Conception M.D.   On: 06/21/2021 13:49   DG Chest Port 1  View  Result Date: 06/21/2021 CLINICAL DATA:  Questionable sepsis - evaluate for abnormality EXAM: PORTABLE CHEST 1 VIEW COMPARISON:  November 30, 2020. FINDINGS: Low lung volumes. Elevated left hemidiaphragm. Streaky bibasilar opacities. No visible pleural effusions or pneumothorax. The patient's chin obscures the medial lung apices. No large pleural effusions. Chronically enlarged enlarged cardiac silhouette, partially obscured. Prior TAVR. Calcific atherosclerosis of the aorta. Partially imaged pigtail drain in the right upper quadrant. IMPRESSION: Low lung volumes with elevated left hemidiaphragm and similar streaky bibasilar opacities, most likely atelectasis. Aspiration and/or pneumonia is not excluded. Electronically Signed   By: Margaretha Sheffield M.D.   On: 06/21/2021 10:47     Labs:   Basic Metabolic Panel: Recent Labs  Lab 06/21/21 1107 06/21/21 1113 06/22/21 0519 06/23/21 0343 06/24/21 0146 06/25/21 0621  NA 135 137 138 138 140 138  K 4.3 4.3 3.7 3.3* 3.3* 3.6  CL 103 104 105 109 111 108  CO2 22  --  '22 23 23 23  '$ GLUCOSE 191* 191* 143* 119* 121* 107*  BUN '21 22 23 23 20 19  '$ CREATININE 1.11 1.00 0.96 0.83 0.87 0.89  CALCIUM 8.6*  --  9.0 8.1* 7.9* 7.9*  MG 2.2  --   --  2.0 2.1 2.0  PHOS  --   --   --  2.7  --   --    GFR Estimated Creatinine Clearance: 61.2 mL/min (by C-G formula based on SCr of 0.89 mg/dL). Liver Function Tests: Recent Labs  Lab 06/21/21 1107 06/22/21 0519 06/23/21 0343  AST 17  --  13*  ALT 10  --  9  ALKPHOS 106  --  73  BILITOT 2.2* 1.7* 0.9  PROT 7.0  --  5.6*  ALBUMIN 3.0*  --  2.2*   No results for input(s): LIPASE, AMYLASE in the last 168 hours. No results for input(s): AMMONIA in the last 168 hours. Coagulation profile No results for input(s): INR, PROTIME in the last 168 hours.  CBC: Recent Labs  Lab 06/21/21 1107 06/21/21 1113 06/22/21 0519 06/23/21 0343 06/24/21 0146 06/25/21 0621  WBC 17.8*  --  15.8* 10.3 7.4 6.1   NEUTROABS 15.7*  --   --   --   --   --   HGB 14.2 13.6 13.3 11.9* 12.1* 12.4*  HCT 41.6 40.0 37.8* 34.7* 34.5* 36.0*  MCV 99.8  --  97.9 98.3 99.1 99.2  PLT 139*  --  118* 117* 127* 129*   Cardiac Enzymes: No results for input(s): CKTOTAL, CKMB, CKMBINDEX, TROPONINI in the last 168 hours. BNP: Invalid input(s): POCBNP CBG: No results for input(s): GLUCAP in the last 168 hours. D-Dimer No results for input(s): DDIMER in the last 72 hours. Hgb A1c No results for input(s): HGBA1C in the last 72 hours. Lipid Profile No results for input(s): CHOL, HDL, LDLCALC, TRIG, CHOLHDL, LDLDIRECT in the last 72 hours. Thyroid function studies No results for input(s): TSH, T4TOTAL, T3FREE, THYROIDAB in the last 72 hours.  Invalid input(s): FREET3 Anemia work up No results for input(s): VITAMINB12, FOLATE, FERRITIN, TIBC, IRON, RETICCTPCT in the last 72 hours. Microbiology Recent Results (from the past 240 hour(s))  Blood Culture (routine x 2)     Status: None (Preliminary result)   Collection Time: 06/21/21  9:45 AM   Specimen: Left Antecubital; Blood  Result Value Ref Range Status   Specimen Description LEFT ANTECUBITAL  Final   Special Requests   Final    BOTTLES DRAWN AEROBIC AND ANAEROBIC Blood Culture results may not be optimal due to an inadequate volume of blood received in culture bottles   Culture   Final    NO GROWTH 3 DAYS Performed at [facility], [place] [address] [place], [place] [address]    Report Status PENDING  Incomplete  Resp Panel by RT-PCR (Flu A&B, Covid) Nasopharyngeal Swab     Status: None   Collection Time: 06/21/21 10:07 AM   Specimen: Nasopharyngeal Swab; Nasopharyngeal(NP) swabs in vial transport medium  Result Value Ref Range Status   SARS Coronavirus 2 by RT PCR NEGATIVE NEGATIVE Final    Comment: (NOTE) SARS-CoV-2 target nucleic acids are NOT DETECTED.  The SARS-CoV-2 RNA is generally detectable in upper respiratory specimens during the acute  phase of infection. The lowest concentration of SARS-CoV-2 viral copies this assay can detect is 138 copies/mL. A negative result does not preclude SARS-Cov-2 infection and should not be used as the sole basis for treatment or other patient management decisions. A negative result may occur with  improper specimen collection/handling, submission of specimen other than nasopharyngeal swab, presence of viral mutation(s) within the areas targeted by this assay, and inadequate number of viral copies(<138 copies/mL). A negative result must be combined with clinical observations, patient history, and epidemiological information. The expected result is Negative.  Fact Sheet for  Patients:  EntrepreneurPulse.com.au  Fact Sheet for Healthcare Providers:  IncredibleEmployment.be  This test is no t yet approved or cleared by the [place] FDA and  has been authorized for detection and/or diagnosis of SARS-CoV-2 by FDA under an Emergency Use Authorization (EUA). This EUA will remain  in effect (meaning this test can be used) for the duration of the COVID-19 declaration under Section 564(b)(1) of the Act, 21 U.S.C.section 360bbb-3(b)(1), unless the authorization is terminated  or revoked sooner.       Influenza A by PCR NEGATIVE NEGATIVE Final   Influenza B by PCR NEGATIVE NEGATIVE Final    Comment: (NOTE) The Xpert Xpress SARS-CoV-2/FLU/RSV plus assay is intended as an aid in the diagnosis of influenza from Nasopharyngeal swab specimens and should not be used as a sole basis for treatment. Nasal washings and aspirates are unacceptable for Xpert Xpress SARS-CoV-2/FLU/RSV testing.  Fact Sheet for Patients: EntrepreneurPulse.com.au  Fact Sheet for Healthcare Providers: IncredibleEmployment.be  This test is not yet approved or cleared by the [place] FDA and has been authorized for detection and/or diagnosis of  SARS-CoV-2 by FDA under an Emergency Use Authorization (EUA). This EUA will remain in effect (meaning this test can be used) for the duration of the COVID-19 declaration under Section 564(b)(1) of the Act, 21 U.S.C. section 360bbb-3(b)(1), unless the authorization is terminated or revoked.  Performed at [facility], [place] [address] [place], [place] [address]   Blood Culture (routine x 2)     Status: None (Preliminary result)   Collection Time: 06/21/21 11:00 AM   Specimen: Right Antecubital; Blood  Result Value Ref Range Status   Specimen Description RIGHT ANTECUBITAL  Final   Special Requests   Final    BOTTLES DRAWN AEROBIC AND ANAEROBIC Blood Culture adequate volume   Culture   Final    NO GROWTH 3 DAYS Performed at [facility], [place] [address] [place], [place] [address]    Report Status PENDING  Incomplete  Urine Culture     Status: Abnormal   Collection Time: 06/21/21 11:27 AM   Specimen: In/Out Cath Urine  Result Value Ref Range Status   Specimen Description IN/OUT CATH URINE  Final   Special Requests   Final    NONE Performed at [facility], [place] [address] [place], [place] [address]    Culture >=100,000 COLONIES/mL ESCHERICHIA COLI (A)  Final   Report Status 06/23/2021 FINAL  Final   Organism ID, Bacteria ESCHERICHIA COLI (A)  Final      Susceptibility   Escherichia coli - MIC*    AMPICILLIN <=2 SENSITIVE Sensitive     CEFAZOLIN <=4 SENSITIVE Sensitive     CEFEPIME <=0.12 SENSITIVE Sensitive     CEFTRIAXONE <=0.25 SENSITIVE Sensitive     CIPROFLOXACIN <=0.25 SENSITIVE Sensitive     GENTAMICIN <=1 SENSITIVE Sensitive     IMIPENEM <=0.25 SENSITIVE Sensitive     NITROFURANTOIN <=16 SENSITIVE Sensitive     TRIMETH/SULFA <=20 SENSITIVE Sensitive     AMPICILLIN/SULBACTAM <=2 SENSITIVE Sensitive     PIP/TAZO <=4 SENSITIVE Sensitive     * >=100,000 COLONIES/mL ESCHERICHIA COLI  MRSA Next Gen by PCR, Nasal     Status: Abnormal   Collection Time:  06/21/21 12:35 PM   Specimen: Nasal Mucosa; Nasal Swab  Result Value Ref Range Status   MRSA by PCR Next Gen DETECTED (A) NOT DETECTED Final    Comment: RESULT CALLED TO, READ BACK BY AND VERIFIED WITH:  RN Nicola Girt [id number] (NOTE) The GeneXpert MRSA Assay (FDA approved for NASAL specimens only), is one component of a comprehensive MRSA colonization surveillance program. It is not intended to diagnose MRSA infection nor to guide or monitor treatment for MRSA infections. Test performance is not FDA approved in patients less than 18 years old. Performed at [facility], [place] [address], [place], [place] [address]      Discharge Instructions:   Discharge Instructions     Diet - low sodium heart healthy   Complete by: As directed    Dysphagia II diet   Discharge instructions   Complete by: As directed    Follow up with your primary care physician in one week. Take antibiotics as prescribed.   Increase activity slowly   Complete by: As directed       Allergies as of 06/25/2021       Reactions   Crestor [rosuvastatin] Other (See Comments)   Hurts muscles   Lipitor [atorvastatin] Other (See Comments)   Hurts stomach   Shrimp [shellfish Allergy] Other (See Comments)   On MAR        Medication List     TAKE these medications    amiodarone 200 MG tablet Commonly known as: PACERONE TAKE 1 TABLET(200 MG) BY MOUTH DAILY What changed: See the new instructions.   aspirin EC 81 MG tablet Take 81 mg by mouth every Monday, Wednesday, and Friday.   cephALEXin 500 MG capsule Commonly known as: KEFLEX Take 1 capsule (500 mg total) by mouth 3 (three) times daily for 3 days.   cholecalciferol 1000 units tablet Commonly known as: VITAMIN D Take 2,000 Units by mouth at bedtime.   fexofenadine 60 MG tablet Commonly known as: ALLEGRA Take 60 mg by mouth daily.   Normal Saline Flush 0.9 % Soln Use 5 mLs as directed every 12 hours (to flush drain)   polyethylene  glycol 17 g packet Commonly known as: MIRALAX / GLYCOLAX Take 17 g by mouth as needed.   potassium chloride 10 MEQ tablet Commonly known as: KLOR-CON Take 1 tablet (10 mEq total) by mouth daily.   PRESERVISION AREDS 2 PO Take by mouth. Take one tablet by mouth dailey   PROBIOTIC DAILY PO Take 1 capsule by mouth daily.   RA CRANBERRY SUPPLEMENTS PO Take by mouth in the morning and at bedtime.   ursodiol 300 MG capsule Commonly known as: ACTIGALL TAKE 2 CAPSULES(600 MG) BY MOUTH TWICE DAILY What changed: See the new instructions.   zinc gluconate 50 MG tablet Take 50 mg by mouth daily.        Follow-up Information     Tisovec, Fransico Him, MD Follow up in 1 week(s).   Specialty: Internal Medicine Why: The office will call patient. Contact information: [place] [address] [phone]         Sanda Klein, MD .   Specialty: Cardiology Contact information: [address] [place] [address] [phone]                  Time coordinating discharge: 39 minutes  Signed:  Keithan Dileonardo  [facility] 06/25/2021, 11:46 AM

## 2021-06-25 NOTE — Plan of Care (Signed)
  Problem: Pain Managment: Goal: General experience of comfort will improve Outcome: Completed/Met

## 2021-06-25 NOTE — Plan of Care (Signed)
  Problem: Education: Goal: Knowledge of General Education information will improve Description: Including pain rating scale, medication(s)/side effects and non-pharmacologic comfort measures 06/25/2021 1621 by Lysbeth Galas, RN Outcome: Adequate for Discharge 06/25/2021 1621 by Lysbeth Galas, RN Outcome: Progressing   Problem: Health Behavior/Discharge Planning: Goal: Ability to manage health-related needs will improve 06/25/2021 1621 by Lysbeth Galas, RN Outcome: Adequate for Discharge 06/25/2021 1621 by Lysbeth Galas, RN Outcome: Progressing   Problem: Clinical Measurements: Goal: Ability to maintain clinical measurements within normal limits will improve 06/25/2021 1621 by Lysbeth Galas, RN Outcome: Adequate for Discharge 06/25/2021 1621 by Lysbeth Galas, RN Outcome: Progressing Goal: Will remain free from infection 06/25/2021 1621 by Lysbeth Galas, RN Outcome: Adequate for Discharge 06/25/2021 1621 by Lysbeth Galas, RN Outcome: Progressing Goal: Diagnostic test results will improve 06/25/2021 1621 by Lysbeth Galas, RN Outcome: Adequate for Discharge 06/25/2021 1621 by Lysbeth Galas, RN Outcome: Progressing Goal: Respiratory complications will improve 06/25/2021 1621 by Lysbeth Galas, RN Outcome: Adequate for Discharge 06/25/2021 1621 by Lysbeth Galas, RN Outcome: Progressing Goal: Cardiovascular complication will be avoided 06/25/2021 1621 by Lysbeth Galas, RN Outcome: Adequate for Discharge 06/25/2021 1621 by Lysbeth Galas, RN Outcome: Progressing   Problem: Activity: Goal: Risk for activity intolerance will decrease 06/25/2021 1621 by Lysbeth Galas, RN Outcome: Adequate for Discharge 06/25/2021 1621 by Lysbeth Galas, RN Outcome: Progressing   Problem: Nutrition: Goal: Adequate nutrition will be maintained 06/25/2021 1621 by Lysbeth Galas, RN Outcome: Adequate for Discharge 06/25/2021 1621 by Lysbeth Galas, RN Outcome: Progressing    Problem: Coping: Goal: Level of anxiety will decrease 06/25/2021 1621 by Lysbeth Galas, RN Outcome: Adequate for Discharge 06/25/2021 1621 by Lysbeth Galas, RN Outcome: Progressing   Problem: Elimination: Goal: Will not experience complications related to bowel motility 06/25/2021 1621 by Lysbeth Galas, RN Outcome: Adequate for Discharge 06/25/2021 1621 by Lysbeth Galas, RN Outcome: Progressing Goal: Will not experience complications related to urinary retention 06/25/2021 1621 by Lysbeth Galas, RN Outcome: Adequate for Discharge 06/25/2021 1621 by Lysbeth Galas, RN Outcome: Progressing   Problem: Safety: Goal: Ability to remain free from injury will improve 06/25/2021 1621 by Lysbeth Galas, RN Outcome: Adequate for Discharge 06/25/2021 1621 by Lysbeth Galas, RN Outcome: Progressing   Problem: Skin Integrity: Goal: Risk for impaired skin integrity will decrease 06/25/2021 1621 by Lysbeth Galas, RN Outcome: Adequate for Discharge 06/25/2021 1621 by Lysbeth Galas, RN Outcome: Progressing   Problem: Education: Goal: Ability to demonstrate management of disease process will improve 06/25/2021 1621 by Lysbeth Galas, RN Outcome: Adequate for Discharge 06/25/2021 1621 by Lysbeth Galas, RN Outcome: Progressing Goal: Ability to verbalize understanding of medication therapies will improve 06/25/2021 1621 by Lysbeth Galas, RN Outcome: Adequate for Discharge 06/25/2021 1621 by Lysbeth Galas, RN Outcome: Progressing Goal: Individualized Educational Video(s) 06/25/2021 1621 by Lysbeth Galas, RN Outcome: Adequate for Discharge 06/25/2021 1621 by Lysbeth Galas, RN Outcome: Progressing   Problem: Activity: Goal: Capacity to carry out activities will improve 06/25/2021 1621 by Lysbeth Galas, RN Outcome: Adequate for Discharge 06/25/2021 1621 by Lysbeth Galas, RN Outcome: Progressing   Problem: Cardiac: Goal: Ability to achieve and maintain adequate  cardiopulmonary perfusion will improve 06/25/2021 1621 by Lysbeth Galas, RN Outcome: Adequate for Discharge 06/25/2021 1621 by Lysbeth Galas, RN Outcome: Progressing

## 2021-06-25 NOTE — Progress Notes (Signed)
PROGRESS NOTE  William [name] [id number] DOB: Jan 25, 1928 DOA: 06/21/2021 PCP: Haywood Pao, MD   LOS: 4 days   Brief narrative:  William [name] is a 87 y.o. 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  Patient is nonverbal and bedbound for the last 2 years.  Patient does need assistance with daily living.  He has been having a lot of phlegm but not much cough.  EMS was called in.  In the ED, patient was noted to be afebrile with tachypnea.  Leukocytosis was present and lactic acid was elevated.  Chest x-ray noted low lung volumes with similar streaky bibasilar opacities thought atelectasis versus aspiration and/or pneumonia.  Urinalysis positive for large leukocytes, many bacteria, 1-50 RBCs/hpf, and greater than 50 WBCs. Influenza and COVID-19 screening was negative.  Patient was given 500 mL of normal saline IV fluids, cefepime, vancomycin, and azithromycin.  Patient was then admitted hospital for further evaluation and treatment.   Assessment/Plan:  Principal Problem:   Sepsis ([place]) Active Problems:   Prostate cancer ([place])   CAP (community acquired pneumonia)   Dementia ([place])   S/P TAVR (transcatheter aortic valve replacement)   Debilitated patient   History of ventricular tachycardia   Acute lower UTI  Sepsis secondary to E. coli urinary tract infection  Was seen by infectious disease.  Currently on cefazolin.  Will change to keflex to discharge. Dc foley catheter.  Metabolic encephalopathy. Improved.   Chronic systolic heart failure.  Last 2D echocardiogram noted EF of 25 to 30% with left ventricular diffuse hypokinesis in 2020.   Continue strict intake and output charting, daily weights.  History of atrial fibrillation/History of ventricular tachycardia:  History of AICD in the past which was explanted due to endocarditis.  Currently on amiodarone.  Hypokalemia. Potassium of 3.6. improved after replacement.  Chronic elevation of troponin. High-sensitivity troponin 22.  Likely secondary to demand ischemia.Marland Kitchen   History of chronic cholecystitis/ history of recurrent cholangitis:  With elevated bilirubin.  On cholecystostomy tube since patient is not a surgical candidate.  Resolved.   History of COVID: COVID test was negative at this time.  History of dementia debility, bedbound status/nonverbal:  Since 2020.  Continue supportive care. Palliative care has been consulted to address ongoing issues..    S/p TAVR in 2019   History of prostate cancer status post cryotherapy  DVT prophylaxis: enoxaparin (LOVENOX) injection 40 mg Start: 06/21/21 2200  Code Status: Partial code, okay for intubation and BiPAP. Overall prognosis appears poor.  Family Communication:  I spoke with the  spouse on the phone.  Status is: Inpatient  Remains inpatient appropriate because:Unsafe d/c plan, IV treatments appropriate due to intensity of illness or inability to take PO, and Inpatient level of care appropriate due to severity of illness  Dispo: The patient is from: Home              Anticipated d/c is to: Home with home health likely by tomorrow.                Patient currently is not medically stable to d/c.   Difficult to place patient No  Consultants: Infectious disease Palliative care  Procedures: None so far  Anti-infectives:  Cefazolin 06/22/2021>  Metronidazole 06/22/2021>  Anti-infectives (From admission, onward)    Start     Dose/Rate Route Frequency Ordered Stop   06/22/21 1400  ceFAZolin (ANCEF) IVPB 1 g/50 mL premix        1 g 100 mL/hr over 30 Minutes Intravenous Every 8 hours 06/22/21 1005     06/22/21  1130  metroNIDAZOLE (FLAGYL) IVPB 500 mg        500 mg 100 mL/hr over 60 Minutes Intravenous Every 12 hours 06/22/21 1031 06/25/21 0212   06/22/21 1115  metroNIDAZOLE (FLAGYL) IVPB 500 mg  Status:  Discontinued        500 mg 100 mL/hr over 60 Minutes Intravenous Every 8 hours 06/22/21 1029 06/22/21 1031   06/22/21 1000  vancomycin (VANCOREADY) IVPB 1250 mg/250 mL  Status:  Discontinued        1,250 mg 166.7 mL/hr over 90 Minutes Intravenous Every 24 hours 06/21/21 1234 06/22/21 1005   06/21/21 2300  ceFEPIme (MAXIPIME) 2 g in sodium chloride 0.9 % 100 mL IVPB  Status:  Discontinued        2 g 200 mL/hr over 30 Minutes Intravenous Every 12 hours 06/21/21 1234 06/22/21 1005   06/21/21 1115  vancomycin (VANCOREADY) IVPB 1500 mg/300 mL        1,500 mg 150 mL/hr over 120 Minutes Intravenous  Once 06/21/21 1108 06/21/21 1441   06/21/21 1115  ceFEPIme (MAXIPIME) 2 g in sodium chloride 0.9 % 100 mL IVPB        2 g 200 mL/hr over 30 Minutes Intravenous  Once 06/21/21 1108 06/21/21 1221   06/21/21 1115  azithromycin (ZITHROMAX) 500 mg in sodium chloride 0.9 % 250 mL IVPB        500 mg 250 mL/hr over 60 Minutes Intravenous  Once 06/21/21 1108 06/21/21 1328      Subjective: Today, patient is seen and examined at bedside. Non verbal  Objective: Vitals:   06/24/21 2011 06/25/21 0545  BP: 129/72 111/71  Pulse: 89 85  Resp: 20 20  Temp: 98.6 F (37 C) 98.6 F (37 C)  SpO2: 97% 97%    Intake/Output Summary (Last 24 hours) at 06/25/2021 1003 Last data filed at 06/25/2021 0849 Gross per 24 hour  Intake 987.36 ml  Output 790 ml  Net 197.36 ml    Filed Weights   06/23/21 0028 06/24/21 0415 06/25/21 0100  Weight: 91.6 kg 90 kg 90.6 kg   Body mass index is 28.66 kg/m.   Physical Exam:  General:  Average built, not in obvious distress, nonverbal, does not follow command, opens eyes spontaneously HENT:   No scleral pallor or icterus noted. Oral mucosa is moist.  Chest:   Diminished  breath sounds bilaterally. No crackles or wheezes.  CVS: S1 &S2 heard. No murmur.  Regular rate and rhythm. Abdomen: Soft, nontender, nondistended.  Bowel sounds are heard.  Biliary drain in the right abdomen Extremities: No cyanosis, clubbing or edema.  Peripheral pulses are palpable. Psych: Nonverbal CNS: Nonverbal,  Skin: Warm and dry.  No rashes noted.  Data Review: I have personally reviewed the following laboratory data and studies,  CBC: Recent Labs  Lab 06/21/21 1107 06/21/21 1113 06/22/21 0519 06/23/21 0343 06/24/21 0146 06/25/21 0621  WBC 17.8*  --  15.8* 10.3 7.4 6.1  NEUTROABS 15.7*  --   --   --   --   --   HGB 14.2 13.6 13.3 11.9* 12.1* 12.4*  HCT 41.6 40.0 37.8* 34.7* 34.5* 36.0*  MCV  99.8  --  97.9 98.3 99.1 99.2  PLT 139*  --  118* 117* 127* 129*    Basic Metabolic Panel: Recent Labs  Lab 06/21/21 1107 06/21/21 1113 06/22/21 0519 06/23/21 0343 06/24/21 0146 06/25/21 0621  NA 135 137 138 138 140 138  K 4.3 4.3 3.7 3.3* 3.3* 3.6  CL 103 104 105 109 111 108  CO2 22  --  '22 23 23 23  '$ GLUCOSE 191* 191* 143* 119* 121* 107*  BUN '21 22 23 23 20 19  '$ CREATININE 1.11 1.00 0.96 0.83 0.87 0.89  CALCIUM 8.6*  --  9.0 8.1* 7.9* 7.9*  MG 2.2  --   --  2.0 2.1 2.0  PHOS  --   --   --  2.7  --   --     Liver Function Tests: Recent Labs  Lab 06/21/21 1107 06/22/21 0519 06/23/21 0343  AST 17  --  13*  ALT 10  --  9  ALKPHOS 106  --  73  BILITOT 2.2* 1.7* 0.9  PROT 7.0  --  5.6*  ALBUMIN 3.0*  --  2.2*    No results for input(s): LIPASE, AMYLASE in the last 168 hours. No results for input(s): AMMONIA in the last 168 hours. Cardiac Enzymes: No results for input(s): CKTOTAL, CKMB, CKMBINDEX, TROPONINI in the last 168 hours. BNP (last 3 results) Recent Labs    06/21/21 1107  BNP 208.9*     ProBNP (last 3 results) No results for input(s): PROBNP in the last 8760 hours.  CBG: No results for input(s): GLUCAP in the last 168 hours. Recent Results  (from the past 240 hour(s))  Blood Culture (routine x 2)     Status: None (Preliminary result)   Collection Time: 06/21/21  9:45 AM   Specimen: Left Antecubital; Blood  Result Value Ref Range Status   Specimen Description LEFT ANTECUBITAL  Final   Special Requests   Final    BOTTLES DRAWN AEROBIC AND ANAEROBIC Blood Culture results may not be optimal due to an inadequate volume of blood received in culture bottles   Culture   Final    NO GROWTH 3 DAYS Performed at [facility], [place] [address], [place], [place] [address]    Report Status PENDING  Incomplete  Resp Panel by RT-PCR (Flu A&B, Covid) Nasopharyngeal Swab     Status: None   Collection Time: 06/21/21 10:07 AM   Specimen: Nasopharyngeal Swab; Nasopharyngeal(NP) swabs in vial transport medium  Result Value Ref Range Status   SARS Coronavirus 2 by RT PCR NEGATIVE NEGATIVE Final    Comment: (NOTE) SARS-CoV-2 target nucleic acids are NOT DETECTED.  The SARS-CoV-2 RNA is generally detectable in upper respiratory specimens during the acute phase of infection. The lowest concentration of SARS-CoV-2 viral copies this assay can detect is 138 copies/mL. A negative result does not preclude SARS-Cov-2 infection and should not be used as the sole basis for treatment or other patient management decisions. A negative result may occur with  improper specimen collection/handling, submission of specimen other than nasopharyngeal swab, presence of viral mutation(s) within the areas targeted by this assay, and inadequate number of viral copies(<138 copies/mL). A negative result must be combined with clinical observations, patient history, and epidemiological information. The expected result is Negative.  Fact Sheet for Patients:  EntrepreneurPulse.com.au  Fact Sheet for Healthcare Providers:  IncredibleEmployment.be  This test is no t yet approved or cleared by the [place] FDA and  has been  authorized for detection and/or diagnosis of SARS-CoV-2 by FDA under an Emergency Use Authorization (EUA). This EUA will remain  in effect (meaning this test can be used) for the duration of the COVID-19 declaration under Section 564(b)(1) of the Act, 21 U.S.C.section 360bbb-3(b)(1), unless the authorization is terminated  or revoked sooner.       Influenza A by PCR NEGATIVE NEGATIVE Final   Influenza B by PCR NEGATIVE NEGATIVE Final    Comment: (NOTE) The Xpert Xpress SARS-CoV-2/FLU/RSV plus assay is intended as an aid in the diagnosis of influenza from Nasopharyngeal swab specimens and should not be used as a sole basis for treatment. Nasal washings and aspirates are unacceptable for Xpert Xpress SARS-CoV-2/FLU/RSV testing.  Fact Sheet for Patients: EntrepreneurPulse.com.au  Fact Sheet for Healthcare Providers: IncredibleEmployment.be  This test is not yet approved or cleared by the [place] FDA and has been authorized for detection and/or diagnosis of SARS-CoV-2 by FDA under an Emergency Use Authorization (EUA). This EUA will remain in effect (meaning this test can be used) for the duration of the COVID-19 declaration under Section 564(b)(1) of the Act, 21 U.S.C. section 360bbb-3(b)(1), unless the authorization is terminated or revoked.  Performed at [facility], [place] [address], [place], [place] [address]   Blood Culture (routine x 2)     Status: None (Preliminary result)   Collection Time: 06/21/21 11:00 AM   Specimen: Right Antecubital; Blood  Result Value Ref Range Status   Specimen Description RIGHT ANTECUBITAL  Final   Special Requests   Final    BOTTLES DRAWN AEROBIC AND ANAEROBIC Blood Culture adequate volume   Culture   Final    NO GROWTH 3 DAYS Performed at [facility], [place] [address] [place], [place] [address]    Report Status PENDING  Incomplete  Urine Culture     Status: Abnormal   Collection Time:  06/21/21 11:27 AM   Specimen: In/Out Cath Urine  Result Value Ref Range Status   Specimen Description IN/OUT CATH URINE  Final   Special Requests   Final    NONE Performed at [facility], [place] [address] [place], [place] [address]    Culture >=100,000 COLONIES/mL ESCHERICHIA COLI (A)  Final   Report Status 06/23/2021 FINAL  Final   Organism ID, Bacteria ESCHERICHIA COLI (A)  Final      Susceptibility   Escherichia coli - MIC*    AMPICILLIN <=2 SENSITIVE Sensitive     CEFAZOLIN <=4 SENSITIVE Sensitive     CEFEPIME <=0.12 SENSITIVE Sensitive     CEFTRIAXONE <=0.25 SENSITIVE Sensitive     CIPROFLOXACIN <=0.25 SENSITIVE Sensitive     GENTAMICIN <=1 SENSITIVE Sensitive     IMIPENEM <=0.25 SENSITIVE Sensitive     NITROFURANTOIN <=16 SENSITIVE Sensitive     TRIMETH/SULFA <=20 SENSITIVE Sensitive     AMPICILLIN/SULBACTAM <=2 SENSITIVE Sensitive     PIP/TAZO <=4 SENSITIVE Sensitive     * >=100,000 COLONIES/mL ESCHERICHIA COLI  MRSA Next Gen by PCR, Nasal     Status: Abnormal   Collection Time: 06/21/21 12:35 PM   Specimen: Nasal Mucosa; Nasal Swab  Result Value Ref Range Status   MRSA by PCR Next Gen DETECTED (A) NOT DETECTED Final    Comment: RESULT CALLED TO, READ BACK BY AND VERIFIED WITH: RN Nicola Girt [phone] MLM (NOTE) The GeneXpert MRSA Assay (FDA approved for NASAL specimens only), is one component of a comprehensive MRSA colonization surveillance program. It is not intended  to diagnose MRSA infection nor to guide or monitor treatment for MRSA infections. Test performance is not FDA approved in patients less than 16 years old. Performed at [facility], [place] [address] [place], [place] [address]       Studies: No results found.    Flora Lipps, MD  [facility] 06/25/2021  If 7PM-7AM, please contact night-coverage

## 2021-06-25 NOTE — Consult Note (Signed)
   [facility] CM Inpatient Consult   [id number]  William [name] [id number]  [facility] [ACO] Patient: Medicare CMS DCE  Primary Care Provider:  Haywood Pao, MD  Patient screened for hospitalization with noted high risk score for unplanned readmission risk.  Patient discussed in progression meeting this morning with inpatient [facility] team and patient is for home with home health and with paid caregivers.     Plan:  Patient to transition home with home health.    For questions contact:   Natividad Brood, RN BSN [facility] Liaison  [phone] business mobile phone Toll free office [phone]  Fax number: [phone] [email or web address]

## 2021-06-26 LAB — CULTURE, BLOOD (ROUTINE X 2)
Culture: NO GROWTH
Culture: NO GROWTH
Special Requests: ADEQUATE

## 2021-07-27 ENCOUNTER — Other Ambulatory Visit: Payer: Self-pay | Admitting: Gastroenterology

## 2021-08-15 ENCOUNTER — Telehealth: Payer: Self-pay | Admitting: Student

## 2021-08-15 ENCOUNTER — Other Ambulatory Visit (HOSPITAL_COMMUNITY): Payer: Self-pay

## 2021-08-15 MED ORDER — NORMAL SALINE FLUSH 0.9 % IV SOLN
5.0000 mL | Freq: Two times a day (BID) | INTRAVENOUS | 0 refills | Status: AC
  Filled 2021-08-15: qty 600, 60d supply, fill #0
  Filled 2021-08-29: qty 600, 30d supply, fill #0

## 2021-08-15 NOTE — Telephone Encounter (Signed)
Normal Saline refill e-prescribed to the [facility] per request from patient's wife. 60 days prescribed. Patient's wife notified via phone.   Soyla Dryer, [place] [phone] 08/15/2021, 9:04 AM

## 2021-08-16 DIAGNOSIS — I10 Essential (primary) hypertension: Secondary | ICD-10-CM | POA: Diagnosis not present

## 2021-08-16 DIAGNOSIS — I714 Abdominal aortic aneurysm, without rupture, unspecified: Secondary | ICD-10-CM | POA: Diagnosis not present

## 2021-08-16 DIAGNOSIS — R7301 Impaired fasting glucose: Secondary | ICD-10-CM | POA: Diagnosis not present

## 2021-08-16 DIAGNOSIS — Z0000 Encounter for general adult medical examination without abnormal findings: Secondary | ICD-10-CM | POA: Diagnosis not present

## 2021-08-16 DIAGNOSIS — I350 Nonrheumatic aortic (valve) stenosis: Secondary | ICD-10-CM | POA: Diagnosis not present

## 2021-08-16 DIAGNOSIS — Z95810 Presence of automatic (implantable) cardiac defibrillator: Secondary | ICD-10-CM | POA: Diagnosis not present

## 2021-08-16 DIAGNOSIS — K82A2 Perforation of gallbladder in cholecystitis: Secondary | ICD-10-CM | POA: Diagnosis not present

## 2021-08-16 DIAGNOSIS — C61 Malignant neoplasm of prostate: Secondary | ICD-10-CM | POA: Diagnosis not present

## 2021-08-16 DIAGNOSIS — K8689 Other specified diseases of pancreas: Secondary | ICD-10-CM | POA: Diagnosis not present

## 2021-08-16 DIAGNOSIS — Z952 Presence of prosthetic heart valve: Secondary | ICD-10-CM | POA: Diagnosis not present

## 2021-08-16 DIAGNOSIS — E7800 Pure hypercholesterolemia, unspecified: Secondary | ICD-10-CM | POA: Diagnosis not present

## 2021-08-16 DIAGNOSIS — I480 Paroxysmal atrial fibrillation: Secondary | ICD-10-CM | POA: Diagnosis not present

## 2021-08-17 ENCOUNTER — Ambulatory Visit (HOSPITAL_COMMUNITY)
Admission: RE | Admit: 2021-08-17 | Discharge: 2021-08-17 | Disposition: A | Discharge status: Home / Self Care | Payer: Medicare Other | Source: Ambulatory Visit | Attending: Interventional Radiology | Admitting: Interventional Radiology

## 2021-08-17 DIAGNOSIS — K8066 Calculus of gallbladder and bile duct with acute and chronic cholecystitis without obstruction: Secondary | ICD-10-CM | POA: Diagnosis not present

## 2021-08-17 DIAGNOSIS — Z435 Encounter for attention to cystostomy: Secondary | ICD-10-CM | POA: Diagnosis not present

## 2021-08-17 DIAGNOSIS — K8050 Calculus of bile duct without cholangitis or cholecystitis without obstruction: Secondary | ICD-10-CM | POA: Insufficient documentation

## 2021-08-17 DIAGNOSIS — Z23 Encounter for immunization: Secondary | ICD-10-CM | POA: Diagnosis not present

## 2021-08-17 HISTORY — PX: IR EXCHANGE BILIARY DRAIN: IMG6046

## 2021-08-17 MED ORDER — IOHEXOL 300 MG/ML  SOLN
20.0000 mL | Freq: Once | INTRAMUSCULAR | Status: AC | PRN
  Administered 2021-08-17: 10 mL

## 2021-08-17 MED ORDER — LIDOCAINE HCL 1 % IJ SOLN
INTRAMUSCULAR | Status: AC
  Administered 2021-08-17: 5 mL
  Filled 2021-08-17: qty 20

## 2021-08-17 MED ORDER — LIDOCAINE HCL 1 % IJ SOLN
INTRAMUSCULAR | Status: DC | PRN
  Administered 2021-08-17: 5 mL

## 2021-08-20 ENCOUNTER — Other Ambulatory Visit (HOSPITAL_COMMUNITY): Payer: Self-pay | Admitting: Interventional Radiology

## 2021-08-20 DIAGNOSIS — K8050 Calculus of bile duct without cholangitis or cholecystitis without obstruction: Secondary | ICD-10-CM

## 2021-08-23 ENCOUNTER — Encounter: Payer: Self-pay | Admitting: Infectious Disease

## 2021-08-23 ENCOUNTER — Telehealth (INDEPENDENT_AMBULATORY_CARE_PROVIDER_SITE_OTHER): Payer: Medicare Other | Admitting: Infectious Disease

## 2021-08-23 ENCOUNTER — Other Ambulatory Visit (HOSPITAL_COMMUNITY): Payer: Self-pay

## 2021-08-23 ENCOUNTER — Other Ambulatory Visit: Payer: Self-pay

## 2021-08-23 DIAGNOSIS — Z7189 Other specified counseling: Secondary | ICD-10-CM

## 2021-08-23 DIAGNOSIS — T827XXD Infection and inflammatory reaction due to other cardiac and vascular devices, implants and grafts, subsequent encounter: Secondary | ICD-10-CM | POA: Diagnosis not present

## 2021-08-23 DIAGNOSIS — B961 Klebsiella pneumoniae [K. pneumoniae] as the cause of diseases classified elsewhere: Secondary | ICD-10-CM

## 2021-08-23 DIAGNOSIS — K8309 Other cholangitis: Secondary | ICD-10-CM

## 2021-08-23 DIAGNOSIS — B952 Enterococcus as the cause of diseases classified elsewhere: Secondary | ICD-10-CM

## 2021-08-23 DIAGNOSIS — N390 Urinary tract infection, site not specified: Secondary | ICD-10-CM | POA: Insufficient documentation

## 2021-08-23 DIAGNOSIS — R7881 Bacteremia: Secondary | ICD-10-CM | POA: Diagnosis not present

## 2021-08-23 DIAGNOSIS — N3000 Acute cystitis without hematuria: Secondary | ICD-10-CM | POA: Diagnosis not present

## 2021-08-23 HISTORY — DX: Other specified counseling: Z71.89

## 2021-08-23 NOTE — Progress Notes (Signed)
Virtual Visit via Telephone Note  I connected with William [name] and his wife on 08/23/21 at  9:45 AM EST  HCPO, his wife [name], retail by telephone and verified that I am speaking with the correct person using two identifiers.  Location: Patient: Home Provider: [facility]   I discussed the limitations, risks, security and privacy concerns of performing an evaluation and management service by telephone and the availability of in person appointments. I also discussed with the patient's surrogate (his wife)  that there may be a patient responsible charge related to this service. The patients' surrogate expressed understanding and agreed to proceed.   History of Present Illness:  87 y.o. male with recurrent episodes of cholangitis and sepsis at times with bacteremias including a time when he had to have his ICD removed, again remitted admitted with cholangitis and sepsis in the context of an obstructed cholecystostomy tube, and concern for potential obstruction of his biliary tree due to stones.  He was found not to be a surgical candidate which seems quite reasonable to me given his multiple comorbidities including his dementia.   GI were able to remove multiple stones during his admission and cultures from the biliary tree yielded ampicillin sensitive enterococcal species and an Enterobacter species.  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   Patient was met with with palliative care on multiple occasions but his family continued to want "aggressive care".   They have been seeming to want to have a second opinion from a surgeon which seems not very realistic especially in the current environment.   Ultimately arrangements were made for him to continue on the meropenem and later changed to  Augmentin and Bactrim and has done  well.   He has been seen by interventional radiology which have changed his drain.  They cultured his bile  which had multiple calculi in it but it did not appear purulent or not documented as such. Culture was taken which I woud NOT have preferred UNLESS it was purulent. 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   He was seen at [facility] and underwent 1. Percutaneous gallstone extraction.   2. Cystic duct recanalization and balloon dilation   3. Cholecystostomy tube exchange.  on August the 4th, 2020   They found     1. Minimal residual cholelithiasis with successful clearance of the   remaining gallstones.   2. Focal stenosis and occlusion of the cystic duct with successful   recanalization and balloon dilation to 51m. The cystic duct is now widely   patent.   3. Choledocholithiasis without evidence of CBD obstruction.   4 Successful exchange of a new 16 french cholecystostomy tube.     Given the large amount of gallstones found in GB decision made to leave the cholecystostomy in place and have it exchanged every 3 months   I did  think we can trial him off of antibiotics this visit   The family wanted and have stopped the ACTIGALl though I would recommend continuing it. They are to talk with GI re this.   [name] in the interim developed swelling of what sounds  like his parotid gland that is been present for roughly 10 days.  IIt did  not improved he does not have fevers chills confusion or other systemic symptoms.  I was concerned however he could have a MRSA infection in this area that might be resistant to the Bactrim that he is been taking chronically. I placed him on doxycycline and this resolved.     Since then he developed some sleepiness along with a low-grade fever.  His urine has become darker and they had  send it for analysis and culture with his primary care physician.  The organism isolated was fairly unusual 1 and was resistant to most antimicrobials that were orally available other than Macrobid.  He was initially prescribed Bactrim but then later Macrobid and apparently the urine "cleared up.  However his sleepiness has persisted may have even worsened in the last week.   He is also developed a pressure ulcer on his sacral area that was evaluated by wound care    He was seen by interventional radiology and who changed his drain and noted that he had now obstruction of his cystic duct by a stone.   I was apprehensive that he may be harboringInfection now at this obstructed site that could put him at risk for recurrent cholangitis and worse case scenario recurrent bacteremia.  I arrangde for him to be seen in clinic in late October 2020.  On exam in clinic he is fairly somnolent wearing a mask could  barely answer any questions and is wearing mittens to restrain him from pulling out his drain.   In December 2020 he began having fevers that turned out that this was the beginning of COVID-19 infection.  Both he and his wife tested positive.  I was able to help his wife get in contact with the monoclonal antibody infusion clinic and she received a dose of monoclonal antibodies.  They were not able to get Vearl out of the house to get him infused with antibodies.  He also was evaluated in the ER but felt stable for discharge back to home and he and his wife both recovered from this infection at home.  He  had no further evidence of recurrent cholangitis.  His drain is again been change in the frequency of pain exchanges been changed every 5 weeks.  He has been off antibiotics   Drain exchange changed to every 10 weeks  He unfortunately has been nonverbal and largely bedbound since having his COVID-19 infection last year.  He has not had any evidence of infection at the biliary site.  Since I  last communicated with Aahil and his wife he has now been hospitalized in September with an apparent urinary tract infection with a fairly sensitive E. coli.  He was seen by my partner Dr. Linus Salmons.  Antibiotics were appropriately narrowed and he was given appropriate duration of therapy.  Patient is back at home.  He still has a drain that apparently is functioning quite well.  His wife is trying to reposition him to optimize his urine output as she is concerned that maybe that is one of the reasons why he developed a urinary tract infection.  He is a DO NOT RESUSCITATE DO NOT INTUBATE at this point in time and has a DNR form and placard at his home.  He has been vaccinated against influenza now but not had the updated COVID-19 booster.       Past Medical History:  Diagnosis Date   AAA (  abdominal aortic aneurysm)    7/13 3.8cm   Arthritis    "joints" (01/11/2014)   Asthma    "seasonal; some foods"    Chronic systolic CHF (congestive heart failure) ([place])    COVID-19 12/15/2019   Dementia ([place])    HLD (hyperlipidemia)    HOH (hard of hearing)    Lumbar vertebral fracture ([place])    L1- 04/11/2014    Parotitis 06/10/2019   Prostate cancer ([place])    S/P ICD (internal cardiac defibrillator) procedure, 01/11/14 removal of ERI gen and placement of Medtronic Evera XT VR & NEW Right ventricular lead Medtronic 01/12/2014   S/P TAVR (transcatheter aortic valve replacement)    Edwards Sapien 3 THV (size 29 mm, model # [id number], serial # [id number])   Severe aortic stenosis    Sleep apnea    "lost 60# & don't have it anymore" (01/11/2014)   Somnolence 08/04/2019   UTI (urinary tract infection) 08/04/2019   Ventricular tachycardia     Past Surgical History:  Procedure Laterality Date   CARDIAC CATHETERIZATION  08/20/2004   noncritical CAD,mild global hypokinesis, EF 50%   CARDIAC DEFIBRILLATOR PLACEMENT  08/23/2004   Medtronic   CATARACT EXTRACTION W/ INTRAOCULAR LENS IMPLANT Right    COLECTOMY   1990's   CRYOABLATION N/A 02/24/2014   Procedure: CRYO ABLATION PROSTATE;  Surgeon: Ailene Rud, MD;  Location: [facility];  Service: Urology;  Laterality: N/A;   ERCP N/A 01/29/2019   Procedure: ENDOSCOPIC RETROGRADE CHOLANGIOPANCREATOGRAPHY (ERCP);  Surgeon: Milus Banister, MD;  Location: [facility] ENDOSCOPY;  Service: Endoscopy;  Laterality: N/A;   HERNIA REPAIR     "abdomen; from colon OR"   ICD LEAD REMOVAL N/A 09/15/2018   Procedure: ICD LEAD REMOVAL EXTRACTION;  Surgeon: Evans Lance, MD;  Location: [facility] OR;  Service: Cardiovascular;  Laterality: N/A;  DR. BARTLE TO BACK UP   IMPLANTABLE CARDIOVERTER DEFIBRILLATOR (ICD) GENERATOR CHANGE N/A 01/11/2014   Procedure: ICD GENERATOR CHANGE;  Surgeon: Sanda Klein, MD;  Location: [facility] CATH LAB;  Service: Cardiovascular;  Laterality: N/A;   IR CATHETER TUBE CHANGE  03/01/2019   IR CATHETER TUBE CHANGE  04/05/2019   IR CATHETER TUBE CHANGE  07/05/2019   IR CATHETER TUBE CHANGE  08/02/2019   IR CATHETER TUBE CHANGE  08/30/2019   IR CATHETER TUBE CHANGE  09/27/2019   IR CATHETER TUBE CHANGE  10/25/2019   IR CATHETER TUBE CHANGE  11/29/2019   IR EXCHANGE BILIARY DRAIN  12/18/2018   IR EXCHANGE BILIARY DRAIN  01/05/2019   IR EXCHANGE BILIARY DRAIN  01/25/2019   IR EXCHANGE BILIARY DRAIN  01/13/2020   IR EXCHANGE BILIARY DRAIN  02/28/2020   IR EXCHANGE BILIARY DRAIN  04/24/2020   IR EXCHANGE BILIARY DRAIN  07/03/2020   IR EXCHANGE BILIARY DRAIN  09/14/2020   IR EXCHANGE BILIARY DRAIN  11/27/2020   IR EXCHANGE BILIARY DRAIN  01/23/2021   IR EXCHANGE BILIARY DRAIN  03/30/2021   IR EXCHANGE BILIARY DRAIN  06/08/2021   IR EXCHANGE BILIARY DRAIN  08/17/2021   IR PERC CHOLECYSTOSTOMY  11/22/2018   IR RADIOLOGIST EVAL & MGMT  02/26/2019   IR RADIOLOGIST EVAL & MGMT  03/17/2019   IR SINUS/FIST TUBE CHK-NON GI  01/28/2019   LEAD REVISION N/A 01/11/2014   Procedure: LEAD REVISION;  Surgeon: Sanda Klein, MD;  Location: [facility] CATH LAB;  Service: Cardiovascular;  Laterality:  N/A;   NM MYOCAR PERF WALL MOTION  01/29/2012   abnormal c/o infarct/scar,no ischemia present  PROSTATE BIOPSY N/A 11/22/2013   Procedure: PROSTATE BIOPSY AND ULTRASOUND;  Surgeon: Ailene Rud, MD;  Location: [facility];  Service: Urology;  Laterality: N/A;   REMOVAL OF STONES  01/29/2019   Procedure: REMOVAL OF STONES;  Surgeon: Milus Banister, MD;  Location: [facility] ENDOSCOPY;  Service: Endoscopy;;   RIGHT/LEFT HEART CATH AND CORONARY ANGIOGRAPHY N/A 07/06/2018   Procedure: RIGHT/LEFT HEART CATH AND CORONARY ANGIOGRAPHY;  Surgeon: Troy Sine, MD;  Location: [place] CV LAB;  Service: Cardiovascular;  Laterality: N/A;   SPHINCTEROTOMY  01/29/2019   Procedure: SPHINCTEROTOMY;  Surgeon: Milus Banister, MD;  Location: [facility] ENDOSCOPY;  Service: Endoscopy;;   TEE WITHOUT CARDIOVERSION N/A 08/18/2018   Procedure: TRANSESOPHAGEAL ECHOCARDIOGRAM (TEE);  Surgeon: Burnell Blanks, MD;  Location: [place];  Service: Open Heart Surgery;  Laterality: N/A;   TEE WITHOUT CARDIOVERSION N/A 09/08/2018   Procedure: TRANSESOPHAGEAL ECHOCARDIOGRAM (TEE);  Surgeon: Lelon Perla, MD;  Location: [facility] ENDOSCOPY;  Service: Cardiovascular;  Laterality: N/A;   TEE WITHOUT CARDIOVERSION N/A 09/15/2018   Procedure: TRANSESOPHAGEAL ECHOCARDIOGRAM (TEE);  Surgeon: Evans Lance, MD;  Location: [facility] OR;  Service: Cardiovascular;  Laterality: N/A;   TEE WITHOUT CARDIOVERSION N/A 11/26/2018   Procedure: TRANSESOPHAGEAL ECHOCARDIOGRAM (TEE);  Surgeon: Jerline Pain, MD;  Location: [facility] ENDOSCOPY;  Service: Cardiovascular;  Laterality: N/A;   TRANSCATHETER AORTIC VALVE REPLACEMENT, TRANSFEMORAL  08/18/2018   TRANSCATHETER AORTIC VALVE REPLACEMENT, TRANSFEMORAL N/A 08/18/2018   Procedure: TRANSCATHETER AORTIC VALVE REPLACEMENT, TRANSFEMORAL using an Edwards 27m Aortic Valve;  Surgeon: MBurnell Blanks MD;  [place]  Service: Open Heart Surgery;  Laterality: N/A;    Family History  Problem Relation Age  of Onset   Heart failure Mother        Died of "old age" at 927  Pneumonia Father       Social History   Socioeconomic History   Marital status: Married    Spouse name: Not on file   Number of children: 2   Years of education: Not on file   Highest education level: Not on file  Occupational History   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[employer]"  Tobacco Use   Smoking status: Never   Smokeless tobacco: Never  Vaping Use   Vaping Use: Never used  Substance and Sexual Activity   Alcohol use: Not Currently    Alcohol/week: 0.0 standard drinks    Comment: 01/11/2014 "drink 1/2 of a beer twice per month    Drug use: No   Sexual activity: Not Currently  Other Topics Concern   Not on file  Social History Narrative   Not on file   Social Determinants of Health   Financial Resource Strain: Not on file  Food Insecurity: Not on file  Transportation Needs: Not on file  Physical Activity: Not on file  Stress: Not on file  Social Connections: Not on file    Allergies  Allergen Reactions   Crestor [Rosuvastatin] Other (See Comments)    Hurts muscles   Lipitor [Atorvastatin] Other (See Comments)    Hurts stomach   Shrimp [Shellfish Allergy] Other (See Comments)    On MAR     Current Outpatient Medications:    amiodarone (PACERONE) 200 MG tablet, TAKE 1 TABLET(200 MG) BY MOUTH DAILY (Patient taking differently: Take 200 mg by mouth daily.), Disp: 45 tablet, Rfl: 3   aspirin EC 81 MG tablet, Take 81 mg by mouth every Monday, Wednesday, and Friday., Disp: , Rfl:  cholecalciferol (VITAMIN D) 1000 units tablet, Take 2,000 Units by mouth at bedtime., Disp: , Rfl:    Cranberry-Vit C-Lactobacillus (RA CRANBERRY SUPPLEMENTS PO), Take by mouth in the morning and at bedtime. , Disp: , Rfl:    fexofenadine (ALLEGRA) 60 MG tablet, Take 60 mg by mouth daily., Disp: , Rfl:    Multiple Vitamins-Minerals (PRESERVISION AREDS 2 PO), Take by mouth. Take one tablet by mouth dailey , Disp: , Rfl:     polyethylene glycol (MIRALAX / GLYCOLAX) 17 g packet, Take 17 g by mouth as needed., Disp: , Rfl:    Probiotic Product (PROBIOTIC DAILY PO), Take 1 capsule by mouth daily., Disp: , Rfl:    Sodium Chloride Flush (NORMAL SALINE FLUSH) 0.9 % SOLN, Inject 5 ml into biliary drain every 12 hours as directed., Disp: 600 mL, Rfl: 0   ursodiol (ACTIGALL) 300 MG capsule, TAKE 2 CAPSULES(600 MG) BY MOUTH TWICE DAILY, Disp: 360 capsule, Rfl: 0   zinc gluconate 50 MG tablet, Take 50 mg by mouth daily., Disp: , Rfl:    potassium chloride (KLOR-CON) 10 MEQ tablet, Take 1 tablet (10 mEq total) by mouth daily. (Patient not taking: No sig reported), Disp: 90 tablet, Rfl: 2   Review of Systems  Unable to perform ROS: Dementia          Assessment & Plan:     Observations/Objective: Mr. Glennon Hamilton remains bedbound and unable to participate in phone conversations that we have for these visits.  He is clearly coming to end his end of his life and I think his wife understands that.    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  He has a drain still in place  Urinary tract infection: Certainly at risk for this given age and other multiple comorbidities he was treated appropriately and this is resolved.  Ge and we have scheduled a telephone visit visit again in next appointment in May  COVID infection: This was another blow to his functional status and he has been as mentioned bedbound since then.   Goals of care: Still wants to treat treatable things but would not want him intubated or on the ventilator later or for him to have CPR.    .Follow Up Instructions:    I discussed the assessment and treatment plan with the patient. The patient was provided an opportunity to ask questions and all were answered. The patient agreed with the plan and demonstrated an understanding of the  instructions.   The patient was advised to call back or seek an in-person evaluation if the symptoms worsen or if the condition fails to improve as anticipated.  I provided 21 minutes of non-face-to-face time during this encounter.   Alcide Evener, MD

## 2021-08-27 DIAGNOSIS — Z20828 Contact with and (suspected) exposure to other viral communicable diseases: Secondary | ICD-10-CM | POA: Diagnosis not present

## 2021-08-29 ENCOUNTER — Other Ambulatory Visit (HOSPITAL_COMMUNITY): Payer: Self-pay

## 2021-09-18 DIAGNOSIS — Z20822 Contact with and (suspected) exposure to covid-19: Secondary | ICD-10-CM | POA: Diagnosis not present

## 2021-09-19 DIAGNOSIS — Z20822 Contact with and (suspected) exposure to covid-19: Secondary | ICD-10-CM | POA: Diagnosis not present

## 2021-10-24 ENCOUNTER — Other Ambulatory Visit: Payer: Self-pay | Admitting: Gastroenterology

## 2021-10-29 ENCOUNTER — Other Ambulatory Visit: Payer: Self-pay | Admitting: Cardiovascular Disease

## 2021-11-12 ENCOUNTER — Other Ambulatory Visit: Payer: Self-pay

## 2021-11-12 ENCOUNTER — Ambulatory Visit (HOSPITAL_COMMUNITY)
Admission: RE | Admit: 2021-11-12 | Discharge: 2021-11-12 | Disposition: A | Discharge status: Home / Self Care | Payer: Medicare Other | Source: Ambulatory Visit | Attending: Interventional Radiology | Admitting: Interventional Radiology

## 2021-11-12 ENCOUNTER — Other Ambulatory Visit (HOSPITAL_COMMUNITY): Payer: Self-pay | Admitting: Interventional Radiology

## 2021-11-12 ENCOUNTER — Other Ambulatory Visit: Payer: Self-pay | Admitting: Student

## 2021-11-12 ENCOUNTER — Other Ambulatory Visit (HOSPITAL_COMMUNITY): Payer: Self-pay

## 2021-11-12 DIAGNOSIS — Z434 Encounter for attention to other artificial openings of digestive tract: Secondary | ICD-10-CM | POA: Insufficient documentation

## 2021-11-12 DIAGNOSIS — K8050 Calculus of bile duct without cholangitis or cholecystitis without obstruction: Secondary | ICD-10-CM | POA: Insufficient documentation

## 2021-11-12 HISTORY — PX: IR EXCHANGE BILIARY DRAIN: IMG6046

## 2021-11-12 MED ORDER — LIDOCAINE HCL 1 % IJ SOLN
INTRAMUSCULAR | Status: DC | PRN
  Administered 2021-11-12: 5 mL via INTRADERMAL

## 2021-11-12 MED ORDER — SODIUM CHLORIDE 0.9% FLUSH
10.0000 mL | INTRAVENOUS | 99 refills | Status: DC | PRN
  Filled 2021-11-12 – 2021-12-14 (×2): qty 300, 30d supply, fill #0
  Filled 2022-01-31: qty 300, 30d supply, fill #1
  Filled 2022-03-14: qty 300, 30d supply, fill #2

## 2021-11-12 MED ORDER — IOHEXOL 300 MG/ML  SOLN
100.0000 mL | Freq: Once | INTRAMUSCULAR | Status: AC | PRN
  Administered 2021-11-12: 10 mL

## 2021-11-12 MED ORDER — LIDOCAINE HCL 1 % IJ SOLN
INTRAMUSCULAR | Status: AC
  Filled 2021-11-12: qty 20

## 2021-11-12 NOTE — Progress Notes (Signed)
Prescription for NS flushes called into the [facility]. Patient is to flush cholecystostomy tube with 10 ml NS each day. 30 days with 11 refills prescribed.   The family knows to call IR with any questions/concerns. Patient scheduled for routine cholecystostomy exchange in 12 weeks.  Soyla Dryer, [place] [phone] 11/12/2021, 3:20 PM

## 2021-11-21 ENCOUNTER — Other Ambulatory Visit (HOSPITAL_COMMUNITY): Payer: Self-pay

## 2021-11-26 ENCOUNTER — Other Ambulatory Visit: Payer: Self-pay | Admitting: Cardiovascular Disease

## 2021-11-29 ENCOUNTER — Other Ambulatory Visit: Payer: Self-pay

## 2021-11-29 ENCOUNTER — Telehealth: Payer: Self-pay

## 2021-11-29 MED ORDER — NITROFURANTOIN MONOHYD MACRO 100 MG PO CAPS: 100.0000 mg | ORAL_CAPSULE | Freq: Two times a day (BID) | ORAL | 0 refills | Status: DC

## 2021-11-29 NOTE — Telephone Encounter (Signed)
Patient wife left a voicemail requesting a refill on Amoxicillin 875 mg with additional refills  Patient is bed bound and has nurses out to see him daily. They have noticed patient urine is smelling strong. Patient wife William [name] stated that patient had same symptoms 4 to 5 months ago and RX for Amoxicillin was sent in. William [name] stated if Dr.Van Verlene Mayer would like to speak with her call back number is [place] [place], [place]

## 2021-12-02 ENCOUNTER — Other Ambulatory Visit: Payer: Self-pay | Admitting: Gastroenterology

## 2021-12-03 ENCOUNTER — Other Ambulatory Visit: Payer: Self-pay | Admitting: Gastroenterology

## 2021-12-14 ENCOUNTER — Other Ambulatory Visit (HOSPITAL_COMMUNITY): Payer: Self-pay

## 2022-01-14 ENCOUNTER — Other Ambulatory Visit: Payer: Self-pay | Admitting: Gastroenterology

## 2022-01-21 ENCOUNTER — Telehealth: Payer: Self-pay | Admitting: Gastroenterology

## 2022-01-21 MED ORDER — URSODIOL 300 MG PO CAPS: 600.0000 mg | ORAL_CAPSULE | Freq: Two times a day (BID) | ORAL | 3 refills | Status: DC

## 2022-01-21 NOTE — Telephone Encounter (Signed)
Patients wife called and stated that patient got a refill for Ursodiol and only got 60 capsules and that It was not going to fast him since he is supposed to take medication 2 times daily. Patients wife is seeking advice what she needs to do. Please advise.  ?

## 2022-01-21 NOTE — Telephone Encounter (Signed)
Returned call to patients wife William [name] regarding refills.  William [name] stated that patient is bed bound and is not able to come into the office for an appointment.  Rx sent to pharmacy as requested.  ?

## 2022-01-28 DIAGNOSIS — Z20822 Contact with and (suspected) exposure to covid-19: Secondary | ICD-10-CM | POA: Diagnosis not present

## 2022-01-31 ENCOUNTER — Other Ambulatory Visit (HOSPITAL_COMMUNITY): Payer: Self-pay

## 2022-02-01 ENCOUNTER — Other Ambulatory Visit: Payer: Self-pay | Admitting: Student

## 2022-02-04 ENCOUNTER — Ambulatory Visit (HOSPITAL_COMMUNITY)
Admission: RE | Admit: 2022-02-04 | Discharge: 2022-02-04 | Disposition: A | Discharge status: Home / Self Care | Payer: Medicare Other | Source: Ambulatory Visit | Attending: Interventional Radiology | Admitting: Interventional Radiology

## 2022-02-04 ENCOUNTER — Other Ambulatory Visit (HOSPITAL_COMMUNITY): Payer: Self-pay | Admitting: Interventional Radiology

## 2022-02-04 DIAGNOSIS — Z434 Encounter for attention to other artificial openings of digestive tract: Secondary | ICD-10-CM | POA: Insufficient documentation

## 2022-02-04 DIAGNOSIS — T85590A Other mechanical complication of bile duct prosthesis, initial encounter: Secondary | ICD-10-CM | POA: Diagnosis not present

## 2022-02-04 DIAGNOSIS — K8050 Calculus of bile duct without cholangitis or cholecystitis without obstruction: Secondary | ICD-10-CM

## 2022-02-04 HISTORY — PX: IR EXCHANGE BILIARY DRAIN: IMG6046

## 2022-02-04 MED ORDER — IOHEXOL 300 MG/ML  SOLN
50.0000 mL | Freq: Once | INTRAMUSCULAR | Status: AC | PRN
  Administered 2022-02-04: 5 mL

## 2022-02-04 MED ORDER — LIDOCAINE HCL 1 % IJ SOLN
INTRAMUSCULAR | Status: AC
  Filled 2022-02-04: qty 20

## 2022-02-10 DIAGNOSIS — I10 Essential (primary) hypertension: Secondary | ICD-10-CM | POA: Diagnosis not present

## 2022-02-10 DIAGNOSIS — E7800 Pure hypercholesterolemia, unspecified: Secondary | ICD-10-CM | POA: Diagnosis not present

## 2022-02-10 DIAGNOSIS — I480 Paroxysmal atrial fibrillation: Secondary | ICD-10-CM | POA: Diagnosis not present

## 2022-02-18 DIAGNOSIS — Z20822 Contact with and (suspected) exposure to covid-19: Secondary | ICD-10-CM | POA: Diagnosis not present

## 2022-02-20 ENCOUNTER — Ambulatory Visit (INDEPENDENT_AMBULATORY_CARE_PROVIDER_SITE_OTHER): Payer: Medicare Other | Admitting: Infectious Disease

## 2022-02-20 ENCOUNTER — Other Ambulatory Visit: Payer: Self-pay

## 2022-02-20 DIAGNOSIS — B961 Klebsiella pneumoniae [K. pneumoniae] as the cause of diseases classified elsewhere: Secondary | ICD-10-CM

## 2022-02-20 DIAGNOSIS — A498 Other bacterial infections of unspecified site: Secondary | ICD-10-CM

## 2022-02-20 DIAGNOSIS — B952 Enterococcus as the cause of diseases classified elsewhere: Secondary | ICD-10-CM | POA: Diagnosis not present

## 2022-02-20 DIAGNOSIS — R7881 Bacteremia: Secondary | ICD-10-CM

## 2022-02-20 DIAGNOSIS — K8050 Calculus of bile duct without cholangitis or cholecystitis without obstruction: Secondary | ICD-10-CM

## 2022-02-20 DIAGNOSIS — Z66 Do not resuscitate: Secondary | ICD-10-CM

## 2022-02-20 DIAGNOSIS — T827XXD Infection and inflammatory reaction due to other cardiac and vascular devices, implants and grafts, subsequent encounter: Secondary | ICD-10-CM | POA: Diagnosis not present

## 2022-02-20 DIAGNOSIS — K8309 Other cholangitis: Secondary | ICD-10-CM | POA: Diagnosis not present

## 2022-02-20 NOTE — Progress Notes (Signed)
? ?Virtual Visit via Telephone Note ? ?I connected with DESI ROWE and his wife on 02/20/22 at 11:00 AM EDT  HCPO, his wife [name], retail by telephone and verified that I am speaking with the correct person using two identifiers. ? ?Location: ?Patient: Home ?Provider: [facility] ?  ?I discussed the limitations, risks, security and privacy concerns of performing an evaluation and management service by telephone and the availability of in person appointments. I also discussed with the patient's surrogate (his wife)  that there may be a patient responsible charge related to this service. The patients' surrogate expressed understanding and agreed to proceed. ? ? ?History of Present Illness: ? ?88 y.o. male with recurrent episodes of cholangitis and sepsis at times with bacteremias including a time when he had to have his ICD removed, again remitted admitted with cholangitis and sepsis in the context of an obstructed cholecystostomy tube, and concern for potential obstruction of his biliary tree due to stones.  He was found not to be a surgical candidate which seems quite reasonable to me given his multiple comorbidities including his dementia. ?  ?GI were able to remove multiple stones during his admission and cultures from the biliary tree yielded ampicillin sensitive enterococcal species and an Enterobacter species.  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 ?  ?Patient was met with with palliative care on multiple occasions but his family continued to want "aggressive care". ?  ?They have been seeming to want to have a second opinion from a surgeon which seems not very realistic especially in the current environment. ?  ?Ultimately arrangements were made for him to continue on the meropenem and later changed to  Augmentin and Bactrim and has done  well.   He has been seen by interventional radiology which have changed his drain.  They cultured his bile  which had multiple calculi in it but it did not appear purulent or not documented as such. Culture was taken which I woud NOT have preferred UNLESS it was purulent. 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  ?Drain has been upsized and he has been seen at [facility] for consultation re means of removing as many of stones from biliary tree as possible without resorting to surgery ?  ?He had removal of further bile and stones via interventional radiology at [facility]. ?  ?He was seen at [facility] and underwent 1. Percutaneous gallstone extraction.   ?2. Cystic duct recanalization and balloon dilation   ?3. Cholecystostomy tube exchange.  on August the 4th, 2020 ?  ?They found ?  ?  ?1. Minimal residual cholelithiasis with successful clearance of the   ?remaining gallstones.   ?2. 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 The cystic duct is now widely   ?patent.   ?3. Choledocholithiasis without evidence of CBD obstruction.   ?4 Successful exchange of a new 16 french cholecystostomy tube.   ?  ?Given the large amount of gallstones found in GB decision made to leave the cholecystostomy in place and have it exchanged every 3 months ?  ?I did  think we can trial him off of antibiotics this visit ?  ?The family wanted and have stopped the ACTIGALl though I would recommend continuing it. They are to talk with GI re this. ?  ?JPrenticehad in the interim developed swelling of what sounds like  his parotid gland that is been present for roughly 10 days.  IIt did  not improved he does not have fevers chills confusion or other systemic symptoms.  I was concerned however he could have a MRSA infection in this area that might be resistant to the Bactrim that he is been taking chronically. I placed him on doxycycline and this resolved. ?  ?  ?Since then he developed some sleepiness along with a low-grade fever.  His urine had become darker and they had  send it for analysis and culture with his primary care physician.  The organism isolated was fairly unusual 1 and was resistant to most antimicrobials that were orally available other than Macrobid.  He was initially prescribed Bactrim but then later Macrobid and apparently the urine "cleared up.  However his sleepiness has persisted may have even worsened in the last week. ?  ?He is also developed a pressure ulcer on his sacral area that was evaluated by wound care  ?  ?He was seen by interventional radiology and who changed his drain and noted that he had now obstruction of his cystic duct by a stone. ?  ?I was apprehensive that he may be harboringInfection now at this obstructed site that could put him at risk for recurrent cholangitis and worse case scenario recurrent bacteremia. ? ?I arrangde for him to be seen in clinic in late October 2020. ? ?On exam in clinic he is fairly somnolent wearing a mask could  barely answer any questions and is wearing mittens to restrain him from pulling out his drain. ? ? ?In December 2020 he began having fevers that turned out that this was the beginning of COVID-19 infection.  Both he and his wife tested positive.  I was able to help his wife get in contact with the monoclonal antibody infusion clinic and she received a dose of monoclonal antibodies.  They were not able to get Ejay out of the house to get him infused with antibodies.  He also was evaluated in the ER but felt stable for discharge back to home and he and his wife both recovered from this infection at home. ? ?He  had no further evidence of recurrent cholangitis.  His drain is again been change in the frequency of pain exchanges been changed every 5 weeks. ? ?He has been off antibiotics  ? ?Drain exchange changed to every 10 weeks ? ?He unfortunately has been nonverbal and largely bedbound since having his COVID-19 infection last year. ? ?He has not had any evidence of infection at the biliary site. ? ?He was  then hospitalized in September with an apparent urinary tract infection with a fairly sensitive E. coli.  He was seen by my partner Dr. Linus Salmons. ? ?Antibiotics were appropriately narrowed and he was given appropriate duration of therapy. ? ?Patien has been back at home.  He still has a drain that apparently is functioning quite well.   He is a DO NOT RESUSCITATE DO NOT INTUBATE at this point in time and has a DNR form and placard at his home. ? ?Last connected with Emitt and family in November he has not had any evidence of recurrent cholangitis he remains on ursodiol to try to reduce chances of new stone formation his drain was exchanged in February 03, 2022. ? ? ? ? ? ? ? ? ?Past Medical History:  ?Diagnosis Date  ? AAA (abdominal aortic aneurysm) ([place])   ? 7/13 3.8cm  ? Arthritis   ? "  joints" (01/11/2014)  ? Asthma   ? "seasonal; some foods"   ? Chronic systolic CHF (congestive heart failure) ([place])   ? COVID-19 12/15/2019  ? Dementia ([place])   ? Goals of care, counseling/discussion 08/23/2021  ? HLD (hyperlipidemia)   ? HOH (hard of hearing)   ? Lumbar vertebral fracture ([facility])   ? L1- 04/11/2014   ? Parotitis 06/10/2019  ? Prostate cancer ([place])   ? S/P ICD (internal cardiac defibrillator) procedure, 01/11/14 removal of ERI gen and placement of Medtronic Evera XT VR & NEW Right ventricular lead Medtronic 01/12/2014  ? S/P TAVR (transcatheter aortic valve replacement)   ? Edwards Sapien 3 THV (size 29 mm, model # [id number], serial # [id number])  ? Severe aortic stenosis   ? Sleep apnea   ? "lost 60# & don't have it anymore" (01/11/2014)  ? Somnolence 08/04/2019  ? UTI (urinary tract infection) 08/04/2019  ? Ventricular tachycardia ([place])   ? ? ?Past Surgical History:  ?Procedure Laterality Date  ? CARDIAC CATHETERIZATION  08/20/2004  ? noncritical CAD,mild global hypokinesis, EF 50%  ? CARDIAC DEFIBRILLATOR PLACEMENT  08/23/2004  ? Medtronic  ? CATARACT EXTRACTION W/ INTRAOCULAR LENS IMPLANT Right   ? COLECTOMY  1990's  ? CRYOABLATION  N/A 02/24/2014  ? Procedure: CRYO ABLATION PROSTATE;  Surgeon: Ailene Rud, MD;  Location: [facility];  Service: Urology;  Laterality: N/A;  ? ERCP N/A 01/29/2019  ? Procedure: ENDOSCOPIC RETROGRADE C

## 2022-03-14 ENCOUNTER — Telehealth: Payer: Self-pay | Admitting: Gastroenterology

## 2022-03-14 ENCOUNTER — Other Ambulatory Visit (HOSPITAL_COMMUNITY): Payer: Self-pay

## 2022-03-14 ENCOUNTER — Other Ambulatory Visit: Payer: Self-pay | Admitting: Internal Medicine

## 2022-03-14 MED ORDER — NORMAL SALINE FLUSH 0.9 % IV SOLN
10.0000 mL | INTRAVENOUS | 99 refills | Status: DC | PRN
  Filled 2022-03-14 – 2022-03-26 (×2): qty 900, 90d supply, fill #0
  Filled 2022-06-19: qty 1200, 120d supply, fill #1
  Filled 2022-10-11 – 2022-10-17 (×2): qty 1200, 120d supply, fill #2
  Filled 2023-02-12: qty 300, 30d supply, fill #3
  Filled 2023-02-13 (×2): qty 1200, 120d supply, fill #3

## 2022-03-14 MED ORDER — URSODIOL 300 MG PO CAPS: 600.0000 mg | ORAL_CAPSULE | Freq: Two times a day (BID) | ORAL | 0 refills | Status: DC

## 2022-03-14 NOTE — Telephone Encounter (Signed)
Patient's wife called requesting a refill for ursodiol for patient.  She's also asking if it can be for 90 days.  Please call patient and advise.  Thank you.

## 2022-03-14 NOTE — Telephone Encounter (Signed)
Left message for Webb Silversmith to advise her that ursodiol was sent to pharmacy as requested. Also, advised to return call to our office to make a follow up appointment (virtual OK since patient is bedridden.)

## 2022-03-21 ENCOUNTER — Other Ambulatory Visit (HOSPITAL_COMMUNITY): Payer: Self-pay

## 2022-03-23 ENCOUNTER — Other Ambulatory Visit (HOSPITAL_COMMUNITY): Payer: Self-pay

## 2022-03-25 ENCOUNTER — Telehealth: Payer: Self-pay | Admitting: Cardiovascular Disease

## 2022-03-25 NOTE — Telephone Encounter (Signed)
Patient's wife called, scheduled virtual visit with Dr. Ardis Hughs 05/01/2022

## 2022-03-25 NOTE — Telephone Encounter (Signed)
Noted , will pass the information to [place]

## 2022-03-25 NOTE — Telephone Encounter (Signed)
*  STAT* If patient is at the pharmacy, call can be transferred to refill team.   1. Which medications need to be refilled? (please list name of each medication and dose if known)  potassium chloride (KLOR-CON) 10 MEQ tablet  2. Which pharmacy/location (including street and city if local pharmacy) is medication to be sent to? [place], [place]  3. Do they need a 30 day or 90 day supply? 90 day  Patient has a virtual appointment 07/11/2022. He is completely out of the medication.

## 2022-03-26 ENCOUNTER — Other Ambulatory Visit (HOSPITAL_COMMUNITY): Payer: Self-pay

## 2022-03-26 MED ORDER — POTASSIUM CHLORIDE ER 10 MEQ PO TBCR: 10.0000 meq | EXTENDED_RELEASE_TABLET | Freq: Every day | ORAL | 0 refills | Status: DC

## 2022-04-22 ENCOUNTER — Ambulatory Visit (HOSPITAL_COMMUNITY)
Admission: RE | Admit: 2022-04-22 | Discharge: 2022-04-22 | Disposition: A | Discharge status: Home / Self Care | Payer: Medicare Other | Source: Ambulatory Visit | Attending: Interventional Radiology | Admitting: Interventional Radiology

## 2022-04-22 ENCOUNTER — Other Ambulatory Visit (HOSPITAL_COMMUNITY): Payer: Self-pay | Admitting: Interventional Radiology

## 2022-04-22 DIAGNOSIS — K8050 Calculus of bile duct without cholangitis or cholecystitis without obstruction: Secondary | ICD-10-CM | POA: Diagnosis not present

## 2022-04-22 DIAGNOSIS — Z434 Encounter for attention to other artificial openings of digestive tract: Secondary | ICD-10-CM | POA: Insufficient documentation

## 2022-04-22 HISTORY — PX: IR EXCHANGE BILIARY DRAIN: IMG6046

## 2022-04-22 MED ORDER — LIDOCAINE HCL 1 % IJ SOLN
INTRAMUSCULAR | Status: AC
  Filled 2022-04-22: qty 20

## 2022-04-22 MED ORDER — IOHEXOL 300 MG/ML  SOLN
50.0000 mL | Freq: Once | INTRAMUSCULAR | Status: AC | PRN
  Administered 2022-04-22: 10 mL

## 2022-05-01 ENCOUNTER — Telehealth: Payer: Medicare Other | Admitting: Gastroenterology

## 2022-05-01 ENCOUNTER — Telehealth: Payer: Self-pay | Admitting: Gastroenterology

## 2022-05-01 MED ORDER — URSODIOL 300 MG PO CAPS: 600.0000 mg | ORAL_CAPSULE | Freq: Two times a day (BID) | ORAL | 3 refills | Status: AC

## 2022-05-01 NOTE — Telephone Encounter (Signed)
Rx for ursodiol '300mg'$  2 pills twice daily sent to pharmacy as instructed per Dr Ardis Hughs.

## 2022-05-01 NOTE — Telephone Encounter (Signed)
William [name] was originally scheduled with a MyChart telemedicine visit however this afternoon when we were communicating with his wife it was clear that they were not at all able to accomplish such a thing.  They have no video capabilities on their computers and really no one at their house to help him orchestrate a telemedicine visit.  Sounds like he is doing okay however obviously I have not seen him in almost 3 years.  He still has a biliary drain in place that gets changed periodically from my review of his epic chart.  They need refills of his Urso.  He is taking 600 mg twice daily.  I told her I am happy to do that for him.    Elmyra Ricks, can you please call him in ursodiol 300 mg pills.  He needs to take 2 pills twice daily.  Dispense 3 months with 3 refills.  Thank you

## 2022-05-14 ENCOUNTER — Other Ambulatory Visit: Payer: Self-pay | Admitting: Cardiovascular Disease

## 2022-06-22 ENCOUNTER — Other Ambulatory Visit (HOSPITAL_COMMUNITY): Payer: Self-pay

## 2022-07-09 ENCOUNTER — Other Ambulatory Visit: Payer: Self-pay | Admitting: Cardiovascular Disease

## 2022-07-11 ENCOUNTER — Ambulatory Visit: Payer: Medicare Other | Attending: Cardiovascular Disease | Admitting: Cardiovascular Disease

## 2022-07-11 VITALS — BP 122/80 | HR 70 | Resp 16

## 2022-07-11 DIAGNOSIS — Z5181 Encounter for therapeutic drug level monitoring: Secondary | ICD-10-CM | POA: Insufficient documentation

## 2022-07-11 DIAGNOSIS — I428 Other cardiomyopathies: Secondary | ICD-10-CM | POA: Insufficient documentation

## 2022-07-11 DIAGNOSIS — I7143 Infrarenal abdominal aortic aneurysm, without rupture: Secondary | ICD-10-CM | POA: Insufficient documentation

## 2022-07-11 DIAGNOSIS — I472 Ventricular tachycardia, unspecified: Secondary | ICD-10-CM | POA: Diagnosis not present

## 2022-07-11 DIAGNOSIS — Z79899 Other long term (current) drug therapy: Secondary | ICD-10-CM | POA: Diagnosis not present

## 2022-07-11 DIAGNOSIS — Z952 Presence of prosthetic heart valve: Secondary | ICD-10-CM | POA: Insufficient documentation

## 2022-07-11 DIAGNOSIS — I2510 Atherosclerotic heart disease of native coronary artery without angina pectoris: Secondary | ICD-10-CM | POA: Insufficient documentation

## 2022-07-11 NOTE — Patient Instructions (Signed)
Medication Instructions:  No changes *If you need a refill on your cardiac medications before your next appointment, please call your pharmacy*   Lab Work: Your provider would like for you to have the following labs: CMET and TSH  If you have labs (blood work) drawn today and your tests are completely normal, you will receive your results only by: [place] (if you have MyChart) OR A paper copy in the mail If you have any lab test that is abnormal or we need to change your treatment, we will call you to review the results.   Testing/Procedures: None ordered   Follow-Up: At [facility], you and your health needs are our priority.  As part of our continuing mission to provide you with exceptional heart care, we have created designated Provider Care Teams.  These Care Teams include your primary Cardiologist (physician) and Advanced Practice Providers (APPs -  Physician Assistants and Nurse Practitioners) who all work together to provide you with the care you need, when you need it.  We recommend signing up for the patient portal called "MyChart".  Sign up information is provided on this After Visit Summary.  MyChart is used to connect with patients for Virtual Visits (Telemedicine).  Patients are able to view lab/test results, encounter notes, upcoming appointments, etc.  Non-urgent messages can be sent to your provider as well.   To learn more about what you can do with MyChart, go to [email or web address].    Your next appointment:   12 month(s)  The format for your next appointment:   In Person  Provider:   Sanda Klein, MD      Important Information About [place]

## 2022-07-11 NOTE — Progress Notes (Signed)
Virtual Visit via Phone Note   This visit type was conducted due to national recommendations for restrictions regarding the COVID-19 Pandemic (e.g. social distancing) in an effort to limit this patient's exposure and mitigate transmission in our community.  Due to his co-morbid illnesses, this patient is at least at moderate risk for complications without adequate follow up.  This format is felt to be most appropriate for this patient at this time.  All issues noted in this document were discussed and addressed.  A limited physical exam was performed with this format.  Please refer to the patient's chart for his consent to telehealth for [facility].   Date:  07/16/2022   ID:  William [name], DOB [id number], MRN [id number]  Patient Location: Home Provider Location: Office/Clinic  PCP:  William Pao, MD  Cardiologist:  William Klein, MD  Electrophysiologist:  None   Evaluation Performed:  Follow-Up Visit  Chief Complaint:  CHF, VT  History of Present Illness:    William [name] is a 88 y.o. 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  General remains nonambulatory and nonverbal.  He has a good appetite and according to his wife it is actually put on a little weight.  They get him up in a chair 3 to 4 hours a day.  He does appear to understand speech and follows commands.  His biliary drain is still in place and will soon need to be changed out.  He has not had any recent problems with fever or chills or other symptoms of sepsis.  His typical blood pressure is 110/70-122/80 and his heart rate is always in the 70s.  He has a home health aide that is capable  of drawing blood test if necessary.  He continues to take amiodarone 200 mg daily and has not had any arrhythmia that we have been able to detect.  Both he and his wife had COVID-19 infection last winter and tolerated it very well.  William [name] takes amiodarone for recurrent episodes of ventricular tachycardia.  It has worked very well to suppress the arrhythmia for years and he has not had any noticeable side effects to date.  He has not had recent liver function test or TSH checked.     Past Medical History:  Diagnosis Date   AAA (abdominal aortic aneurysm) ([place])    7/13 3.8cm   Arthritis    "joints" (01/11/2014)   Asthma    "seasonal; some foods"    Chronic systolic CHF (congestive heart failure) ([place])    COVID-19 12/15/2019   Dementia ([place])    Goals of care, counseling/discussion 08/23/2021   HLD (hyperlipidemia)    HOH (hard of hearing)    Lumbar vertebral fracture ([place])    L1- 04/11/2014    Parotitis 06/10/2019   Prostate cancer ([place])    S/P ICD (internal cardiac defibrillator) procedure, 01/11/14 removal of ERI gen and placement of Medtronic Evera XT VR & NEW Right ventricular lead Medtronic 01/12/2014   S/P TAVR (transcatheter aortic valve replacement)    Edwards Sapien 3 THV (size 29 mm, model # [id number], serial # [id number])   Severe aortic stenosis    Sleep apnea    "lost 60# & don't have it anymore" (01/11/2014)   Somnolence 08/04/2019  UTI (urinary tract infection) 08/04/2019   Ventricular tachycardia [facility])    Past Surgical History:  Procedure Laterality Date   CARDIAC CATHETERIZATION  08/20/2004   noncritical CAD,mild global hypokinesis, EF 50%   CARDIAC DEFIBRILLATOR PLACEMENT  08/23/2004   Medtronic   CATARACT EXTRACTION W/ INTRAOCULAR LENS IMPLANT Right    COLECTOMY  1990's   CRYOABLATION N/A 02/24/2014   Procedure: CRYO ABLATION PROSTATE;  Surgeon: Ailene Rud, MD;  Location: [facility] ORS;  Service: Urology;  Laterality: N/A;   ERCP N/A 01/29/2019   Procedure:  ENDOSCOPIC RETROGRADE CHOLANGIOPANCREATOGRAPHY (ERCP);  Surgeon: Milus Banister, MD;  Location: [facility] ENDOSCOPY;  Service: Endoscopy;  Laterality: N/A;   HERNIA REPAIR     "abdomen; from colon OR"   ICD LEAD REMOVAL N/A 09/15/2018   Procedure: ICD LEAD REMOVAL EXTRACTION;  Surgeon: Evans Lance, MD;  Location: [facility] OR;  Service: Cardiovascular;  Laterality: N/A;  DR. BARTLE TO BACK UP   IMPLANTABLE CARDIOVERTER DEFIBRILLATOR (ICD) GENERATOR CHANGE N/A 01/11/2014   Procedure: ICD GENERATOR CHANGE;  Surgeon: William Klein, MD;  Location: [place] CATH LAB;  Service: Cardiovascular;  Laterality: N/A;   IR CATHETER TUBE CHANGE  03/01/2019   IR CATHETER TUBE CHANGE  04/05/2019   IR CATHETER TUBE CHANGE  07/05/2019   IR CATHETER TUBE CHANGE  08/02/2019   IR CATHETER TUBE CHANGE  08/30/2019   IR CATHETER TUBE CHANGE  09/27/2019   IR CATHETER TUBE CHANGE  10/25/2019   IR CATHETER TUBE CHANGE  11/29/2019   IR EXCHANGE BILIARY DRAIN  12/18/2018   IR EXCHANGE BILIARY DRAIN  01/05/2019   IR EXCHANGE BILIARY DRAIN  01/25/2019   IR EXCHANGE BILIARY DRAIN  01/13/2020   IR EXCHANGE BILIARY DRAIN  02/28/2020   IR EXCHANGE BILIARY DRAIN  04/24/2020   IR EXCHANGE BILIARY DRAIN  07/03/2020   IR EXCHANGE BILIARY DRAIN  09/14/2020   IR EXCHANGE BILIARY DRAIN  11/27/2020   IR EXCHANGE BILIARY DRAIN  01/23/2021   IR EXCHANGE BILIARY DRAIN  03/30/2021   IR EXCHANGE BILIARY DRAIN  06/08/2021   IR EXCHANGE BILIARY DRAIN  08/17/2021   IR EXCHANGE BILIARY DRAIN  11/12/2021   IR EXCHANGE BILIARY DRAIN  02/04/2022   IR EXCHANGE BILIARY DRAIN  04/22/2022   IR EXCHANGE BILIARY DRAIN  07/15/2022   IR PERC CHOLECYSTOSTOMY  11/22/2018   IR RADIOLOGIST EVAL & MGMT  02/26/2019   IR RADIOLOGIST EVAL & MGMT  03/17/2019   IR SINUS/FIST TUBE CHK-NON GI  01/28/2019   LEAD REVISION N/A 01/11/2014   Procedure: LEAD REVISION;  Surgeon: William Klein, MD;  Location: [place] CATH LAB;  Service: Cardiovascular;  Laterality: N/A;   NM MYOCAR PERF WALL MOTION  01/29/2012    abnormal c/o infarct/scar,no ischemia present   PROSTATE BIOPSY N/A 11/22/2013   Procedure: PROSTATE BIOPSY AND ULTRASOUND;  Surgeon: Ailene Rud, MD;  Location: [facility] ORS;  Service: Urology;  Laterality: N/A;   REMOVAL OF STONES  01/29/2019   Procedure: REMOVAL OF STONES;  Surgeon: Milus Banister, MD;  Location: [facility] ENDOSCOPY;  Service: Endoscopy;;   RIGHT/LEFT HEART CATH AND CORONARY ANGIOGRAPHY N/A 07/06/2018   Procedure: RIGHT/LEFT HEART CATH AND CORONARY ANGIOGRAPHY;  Surgeon: Troy Sine, MD;  Location: [place] CV LAB;  Service: Cardiovascular;  Laterality: N/A;   SPHINCTEROTOMY  01/29/2019   Procedure: SPHINCTEROTOMY;  Surgeon: Milus Banister, MD;  Location: [facility] ENDOSCOPY;  Service: Endoscopy;;   TEE WITHOUT CARDIOVERSION N/A 08/18/2018   Procedure: TRANSESOPHAGEAL ECHOCARDIOGRAM (TEE);  Surgeon: Burnell Blanks, MD;  Location: [place];  Service: Open Heart Surgery;  Laterality: N/A;   TEE WITHOUT CARDIOVERSION N/A 09/08/2018   Procedure: TRANSESOPHAGEAL ECHOCARDIOGRAM (TEE);  Surgeon: Lelon Perla, MD;  Location: [facility] ENDOSCOPY;  Service: Cardiovascular;  Laterality: N/A;   TEE WITHOUT CARDIOVERSION N/A 09/15/2018   Procedure: TRANSESOPHAGEAL ECHOCARDIOGRAM (TEE);  Surgeon: Evans Lance, MD;  Location: [facility] OR;  Service: Cardiovascular;  Laterality: N/A;   TEE WITHOUT CARDIOVERSION N/A 11/26/2018   Procedure: TRANSESOPHAGEAL ECHOCARDIOGRAM (TEE);  Surgeon: Jerline Pain, MD;  Location: [facility] ENDOSCOPY;  Service: Cardiovascular;  Laterality: N/A;   TRANSCATHETER AORTIC VALVE REPLACEMENT, TRANSFEMORAL  08/18/2018   TRANSCATHETER AORTIC VALVE REPLACEMENT, TRANSFEMORAL N/A 08/18/2018   Procedure: TRANSCATHETER AORTIC VALVE REPLACEMENT, TRANSFEMORAL using an Edwards 61m Aortic Valve;  Surgeon: MBurnell Blanks MD;  [place]  Service: Open Heart Surgery;  Laterality: N/A;     Current Meds  Medication Sig   amiodarone (PACERONE) 200 MG tablet TAKE 1  TABLET(200 MG) BY MOUTH DAILY   aspirin EC 81 MG tablet Take 81 mg by mouth every Monday, Wednesday, and Friday.   cholecalciferol (VITAMIN D) 1000 units tablet Take 2,000 Units by mouth at bedtime.   Cranberry-Vit C-Lactobacillus (RA CRANBERRY SUPPLEMENTS PO) Take by mouth in the morning and at bedtime.    fexofenadine (ALLEGRA) 60 MG tablet Take 60 mg by mouth daily.   Multiple Vitamins-Minerals (PRESERVISION AREDS 2 PO) Take by mouth. Take one tablet by mouth dailey    polyethylene glycol (MIRALAX / GLYCOLAX) 17 g packet Take 17 g by mouth as needed.   potassium chloride (KLOR-CON) 10 MEQ tablet TAKE 1 TABLET BY MOUTH EVERY DAY   Probiotic Product (PROBIOTIC DAILY PO) Take 1 capsule by mouth daily.   Sodium Chloride Flush (NORMAL SALINE FLUSH) 0.9 % SOLN Use 10 mLs by Intracatheter route as needed.   ursodiol (ACTIGALL) 300 MG capsule Take 2 capsules (600 mg total) by mouth 2 (two) times daily.   zinc gluconate 50 MG tablet Take 50 mg by mouth daily.     Allergies:   Crestor [rosuvastatin] and Lipitor [atorvastatin]   Social History   Tobacco Use   Smoking status: Never   Smokeless tobacco: Never  Vaping Use   Vaping Use: Never used  Substance Use Topics   Alcohol use: Not Currently    Alcohol/week: 0.0 standard drinks of alcohol    Comment: 01/11/2014 "drink 1/2 of a beer twice per month    Drug use: No     Family Hx: The patient's family history includes Heart failure in his mother; Pneumonia in his father.  ROS:   Please see the history of present illness.    All other systems are reviewed and are negative.   Prior CV studies:   The following studies were reviewed today:  Labs/Other Tests and Data Reviewed:    EKG:  No ECG reviewed.  Recent Labs: No results found for requested labs within last 365 days.   Recent Lipid Panel Lab Results  Component Value Date/Time   CHOL 140 01/27/2019 04:25 AM   TRIG 91 01/27/2019 04:25 AM   HDL 22 (L) 01/27/2019 04:25 AM    CHOLHDL 6.4 01/27/2019 04:25 AM   LDLCALC 100 (H) 01/27/2019 04:25 AM    Wt Readings from Last 3 Encounters:  06/25/21 195 lb 5.2 oz (88.6 kg)  12/01/20 180 lb (81.6 kg)  07/21/19 185 lb (83.9 kg)     Objective:  Vital Signs:  BP 122/80   Pulse 70   Resp 16   Unable to examine ASSESSMENT & PLAN:     1. Nonischemic cardiomyopathy ([place])   2. Ventricular tachycardia ([place])   3. S/P TAVR (transcatheter aortic valve replacement)   4. Coronary artery disease involving native coronary artery of native heart without angina pectoris   5. Infrarenal abdominal aortic aneurysm (AAA) without rupture ([place])   6. Encounter for monitoring amiodarone therapy      1. Choledocolithiasis/recurrent biliary sepsis: After experiencing multiple episodes of sepsis in 2020.  He has not had any bad infection events since the tube was placed.   2. NICM/chronic systolic CHF: We have been unable to perform a repeat echocardiogram on William [name] since 2020.  He does not require loop diuretics and according to his wife does not have any edema.  We had to stop all his heart failure medications due to hypotension.  He was actually on midodrine for a while but this has now been stopped.  He does take a potassium supplement, probably necessary for biliary potassium losses.  3. Ventricular tachycardia: In the past this caused syncope whenever we try to wean him off amiodarone.  Unable to monitor and at this point.  For what it is worth it does not appear that he has had any syncopal events.  4. Severe AS s/p TAVR -impossible to have him transported for an echocardiogram, unless he is hospitalized for serious illness.  TEE 11/2018 showed normal AV prosthesis. He remains at high risk for prosthetic valve endocarditis and antibiotic should be administered promptly for any febrile illness and before any invasive procedures with risk of bacteremia.    5. CAD: Moderate CAD by relatively recent cardiac catheterization.  He has not  had angina pectoris.   6. AAA: Not a candidate for surgical repair so we have stopped doing routine surveillance ultrasounds.  It measured 4.6 cm x 5.2cm by February 2020 ultrasound; CT around the same time showed advanced atherosclerotic calcifications involving the aorta. 4.6 x 4.0 cm infrarenal abdominal aortic aneurysm with a left-sided saccular component.   7.  Amiodarone: Labs have not been checked in the last year.  Needs TFTs and LFTs in addition to routine chemistries.  We will try to see if his home health aide can collect the blood sample.    Total time 15 minutes.  Patient Instructions  Medication Instructions:  No changes *If you need a refill on your cardiac medications before your next appointment, please call your pharmacy*   Lab Work: Your provider would like for you to have the following labs: CMET and TSH  If you have labs (blood work) drawn today and your tests are completely normal, you will receive your results only by: [place] (if you have MyChart) OR A paper copy in the mail If you have any lab test that is abnormal or we need to change your treatment, we will call you to review the results.   Testing/Procedures: None ordered   Follow-Up: At [facility], you and your health needs are our priority.  As part of our continuing mission to provide you with exceptional heart care, we have created designated Provider Care Teams.  These Care Teams include your primary Cardiologist (physician) and Advanced Practice Providers (APPs -  Physician Assistants and Nurse Practitioners) who all work together to provide you with the care you need, when you need it.  We recommend signing up for the patient portal called "MyChart".  Sign up information is provided on this After Visit Summary.  MyChart is used to connect with patients for Virtual Visits (Telemedicine).  Patients are able to view lab/test results, encounter notes, upcoming appointments, etc.   Non-urgent messages can be sent to your provider as well.   To learn more about what you can do with MyChart, go to [email or web address].    Your next appointment:   12 month(s)  The format for your next appointment:   In Person  Provider:   Sanda Klein, MD      Important Information About Sugar        Signed, William Klein, MD  07/16/2022 5:28 PM    [place]

## 2022-07-15 ENCOUNTER — Ambulatory Visit (HOSPITAL_COMMUNITY)
Admission: RE | Admit: 2022-07-15 | Discharge: 2022-07-15 | Disposition: A | Discharge status: Home / Self Care | Payer: Medicare Other | Source: Ambulatory Visit | Attending: Interventional Radiology | Admitting: Interventional Radiology

## 2022-07-15 ENCOUNTER — Other Ambulatory Visit (HOSPITAL_COMMUNITY): Payer: Self-pay | Admitting: Interventional Radiology

## 2022-07-15 DIAGNOSIS — Z434 Encounter for attention to other artificial openings of digestive tract: Secondary | ICD-10-CM | POA: Diagnosis not present

## 2022-07-15 DIAGNOSIS — K8050 Calculus of bile duct without cholangitis or cholecystitis without obstruction: Secondary | ICD-10-CM | POA: Diagnosis not present

## 2022-07-15 HISTORY — PX: IR EXCHANGE BILIARY DRAIN: IMG6046

## 2022-07-15 MED ORDER — IOHEXOL 300 MG/ML  SOLN
25.0000 mL | Freq: Once | INTRAMUSCULAR | Status: AC | PRN
  Administered 2022-07-15: 5 mL

## 2022-07-15 MED ORDER — LIDOCAINE HCL 1 % IJ SOLN
INTRAMUSCULAR | Status: AC
  Administered 2022-07-15: 1 mL
  Filled 2022-07-15: qty 20

## 2022-07-23 ENCOUNTER — Ambulatory Visit (INDEPENDENT_AMBULATORY_CARE_PROVIDER_SITE_OTHER): Payer: Medicare Other | Admitting: Infectious Disease

## 2022-07-23 ENCOUNTER — Other Ambulatory Visit: Payer: Self-pay

## 2022-07-23 DIAGNOSIS — K8309 Other cholangitis: Secondary | ICD-10-CM

## 2022-07-23 DIAGNOSIS — B961 Klebsiella pneumoniae [K. pneumoniae] as the cause of diseases classified elsewhere: Secondary | ICD-10-CM | POA: Diagnosis not present

## 2022-07-23 DIAGNOSIS — A498 Other bacterial infections of unspecified site: Secondary | ICD-10-CM | POA: Diagnosis not present

## 2022-07-23 DIAGNOSIS — F0390 Unspecified dementia without behavioral disturbance: Secondary | ICD-10-CM

## 2022-07-23 DIAGNOSIS — K8050 Calculus of bile duct without cholangitis or cholecystitis without obstruction: Secondary | ICD-10-CM

## 2022-07-23 DIAGNOSIS — S025XXB Fracture of tooth (traumatic), initial encounter for open fracture: Secondary | ICD-10-CM | POA: Diagnosis not present

## 2022-07-23 DIAGNOSIS — T827XXD Infection and inflammatory reaction due to other cardiac and vascular devices, implants and grafts, subsequent encounter: Secondary | ICD-10-CM

## 2022-07-23 NOTE — Progress Notes (Signed)
Virtual Visit via Telephone Note  I connected with William [name] and his wife on 07/23/22 at 11:00 AM EDT  HCPO, his wife William [name], retail by telephone and verified that I am speaking with the correct person using two identifiers.  Location: Patient: Home Provider: [facility]   I discussed the limitations, risks, security and privacy concerns of performing an evaluation and management service by telephone and the availability of in person appointments. I also discussed with the patient's surrogate (his wife)  that there may be a patient responsible charge related to this service. The patients' surrogate expressed understanding and agreed to proceed.   History of Present Illness:  88 y.o. male with recurrent episodes of cholangitis and sepsis at times with bacteremias including a time when he had to have his ICD removed, again remitted admitted with cholangitis and sepsis in the context of an obstructed cholecystostomy tube, and concern for potential obstruction of his biliary tree due to stones.  He was found not to be a surgical candidate which seems quite reasonable to me given his multiple comorbidities including his dementia.   GI were able to remove multiple stones during his admission and cultures from the biliary tree yielded ampicillin sensitive enterococcal species and an Enterobacter species.  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   Patient was met with with palliative care on multiple occasions but his family continued to want "aggressive care".   They have been seeming to want to have a second opinion from a surgeon which seems not very realistic especially in the current environment.   Ultimately arrangements were made for him to continue on the meropenem and later changed to  Augmentin and Bactrim and has done  well.   He has been seen by interventional radiology which have changed his drain.  They cultured his bile  which had multiple calculi in it but it did not appear purulent or not documented as such. Culture was taken which I woud NOT have preferred UNLESS it was purulent. 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   He was seen at [facility] and underwent 1. Percutaneous gallstone extraction.   2. Cystic duct recanalization and balloon dilation   3. Cholecystostomy tube exchange.  on August the 4th, 2020   They found     1. Minimal residual cholelithiasis with successful clearance of the   remaining gallstones.   2. 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 The cystic duct is now widely   patent.   3. Choledocholithiasis without evidence of CBD obstruction.   4 Successful exchange of a new 16 french cholecystostomy tube.     Given the large amount of gallstones found in GB decision made to leave the cholecystostomy in place and have it exchanged every 3 months   I did  think we can trial him off of antibiotics this visit   The family wanted and have stopped the ACTIGALl though I would recommend continuing it. They are to talk with GI re this.   JFarleyhad in the interim developed swelling of what sounds like  his parotid gland that is been present for roughly 10 days.  IIt did  not improved he does not have fevers chills confusion or other systemic symptoms.  I was concerned however he could have a MRSA infection in this area that might be resistant to the Bactrim that he is been taking chronically. I placed him on doxycycline and this resolved.     Since then he developed some sleepiness along with a low-grade fever.  His urine had become darker and they had  send it for analysis and culture with his primary care physician.  The organism isolated was fairly unusual 1 and was resistant to most antimicrobials that were orally available other than Macrobid.  He was initially prescribed Bactrim but then later Macrobid and apparently the urine "cleared up.  However his sleepiness has persisted may have even worsened in the last week.   He is also developed a pressure ulcer on his sacral area that was evaluated by wound care    He was seen by interventional radiology and who changed his drain and noted that he had now obstruction of his cystic duct by a stone.   I was apprehensive that he may be harboringInfection now at this obstructed site that could put him at risk for recurrent cholangitis and worse case scenario recurrent bacteremia.  I arrangde for him to be seen in clinic in late October 2020.  On exam in clinic he is fairly somnolent wearing a mask could  barely answer any questions and is wearing mittens to restrain him from pulling out his drain.   In December 2020 he began having fevers that turned out that this was the beginning of COVID-19 infection.  Both he and his wife tested positive.  I was able to help his wife get in contact with the monoclonal antibody infusion clinic and she received a dose of monoclonal antibodies.  They were not able to get William [name] out of the house to get him infused with antibodies.  He also was evaluated in the ER but felt stable for discharge back to home and he and his wife both recovered from this infection at home.  He  had no further evidence of recurrent cholangitis.  His drain is again been change in the frequency of pain exchanges been changed every 5 weeks.  He has been off antibiotics   Drain exchange changed to every 10 weeks  He unfortunately has been nonverbal and largely bedbound since having his COVID-19 infection last year.  He has not had any evidence of infection at the biliary site.  He was  then hospitalized in September with an apparent urinary tract infection with a fairly sensitive E. coli.  He was seen by my partner Dr. Linus Salmons.  Antibiotics were appropriately narrowed and he was given appropriate duration of therapy.  Patien has been back at home.  He still has a drain that apparently is functioning quite well.   He is a DO NOT RESUSCITATE DO NOT INTUBATE at this point in time and has a DNR form and placard at his home.  Connected with Cahlil and family in November he has not had any evidence of recurrent cholangitis he remains on ursodiol to try to reduce chances of new stone formation his drain was exchanged in February 03, 2022.    I connected with William [name] and his wife in May of 2023 and again no no problems with cholangitis or biliary infection.  When I spoke with William [name]'s wife today she  told me that he has been eating food and goes to bed but really does not speak hardly at all.  He did apparently spit out a crown from one of his teeth yesterday.  His drain was changed by Dr. Amelia Jo with IR on October  second 2023.  The contrast results show that there is still a chronic blockage in the cystic duct but otherwise there is good flow throughout the biliary tree.  The drain exchange every 12 weeks.      Past Medical History:  Diagnosis Date   AAA (abdominal aortic aneurysm) ([place])    7/13 3.8cm   Arthritis    "joints" (01/11/2014)   Asthma    "seasonal; some foods"    Chronic systolic CHF (congestive heart failure) ([place])    COVID-19 12/15/2019   Dementia ([place])    Goals of care, counseling/discussion 08/23/2021   HLD (hyperlipidemia)    HOH (hard of hearing)    Lumbar vertebral fracture ([place])    L1- 04/11/2014    Parotitis 06/10/2019   Prostate cancer ([place])    S/P ICD (internal cardiac defibrillator) procedure, 01/11/14 removal of ERI gen and placement of Medtronic Evera XT VR & NEW Right ventricular lead Medtronic 01/12/2014   S/P TAVR (transcatheter aortic valve  replacement)    Edwards Sapien 3 THV (size 29 mm, model # [id number], serial # [id number])   Severe aortic stenosis    Sleep apnea    "lost 60# & don't have it anymore" (01/11/2014)   Somnolence 08/04/2019   UTI (urinary tract infection) 08/04/2019   Ventricular tachycardia ([place])     Past Surgical History:  Procedure Laterality Date   CARDIAC CATHETERIZATION  08/20/2004   noncritical CAD,mild global hypokinesis, EF 50%   CARDIAC DEFIBRILLATOR PLACEMENT  08/23/2004   Medtronic   CATARACT EXTRACTION W/ INTRAOCULAR LENS IMPLANT Right    COLECTOMY  1990's   CRYOABLATION N/A 02/24/2014   Procedure: CRYO ABLATION PROSTATE;  Surgeon: Ailene Rud, MD;  Location: [facility];  Service: Urology;  Laterality: N/A;   ERCP N/A 01/29/2019   Procedure: ENDOSCOPIC RETROGRADE CHOLANGIOPANCREATOGRAPHY (ERCP);  Surgeon: Milus Banister, MD;  Location: [facility] ENDOSCOPY;  Service: Endoscopy;  Laterality: N/A;   HERNIA REPAIR     "abdomen; from colon OR"   ICD LEAD REMOVAL N/A 09/15/2018   Procedure: ICD LEAD REMOVAL EXTRACTION;  Surgeon: Evans Lance, MD;  Location: [facility] OR;  Service: Cardiovascular;  Laterality: N/A;  DR. BARTLE TO BACK UP   IMPLANTABLE CARDIOVERTER DEFIBRILLATOR (ICD) GENERATOR CHANGE N/A 01/11/2014   Procedure: ICD GENERATOR CHANGE;  Surgeon: Sanda Klein, MD;  Location: [facility] CATH LAB;  Service: Cardiovascular;  Laterality: N/A;   IR CATHETER TUBE CHANGE  03/01/2019   IR CATHETER TUBE CHANGE  04/05/2019   IR CATHETER TUBE CHANGE  07/05/2019   IR CATHETER TUBE CHANGE  08/02/2019   IR CATHETER TUBE CHANGE  08/30/2019   IR CATHETER TUBE CHANGE  09/27/2019   IR CATHETER TUBE CHANGE  10/25/2019   IR CATHETER TUBE CHANGE  11/29/2019   IR EXCHANGE BILIARY DRAIN  12/18/2018   IR EXCHANGE BILIARY DRAIN  01/05/2019   IR EXCHANGE BILIARY DRAIN  01/25/2019   IR EXCHANGE BILIARY DRAIN  01/13/2020   IR EXCHANGE BILIARY DRAIN  02/28/2020   IR EXCHANGE BILIARY DRAIN  04/24/2020   IR EXCHANGE BILIARY DRAIN   07/03/2020   IR EXCHANGE BILIARY DRAIN  09/14/2020   IR EXCHANGE BILIARY DRAIN  11/27/2020   IR EXCHANGE BILIARY DRAIN  01/23/2021   IR EXCHANGE BILIARY DRAIN  03/30/2021   IR EXCHANGE BILIARY DRAIN  06/08/2021   IR EXCHANGE BILIARY DRAIN  08/17/2021   IR EXCHANGE BILIARY DRAIN  11/12/2021   IR EXCHANGE BILIARY DRAIN  02/04/2022   IR EXCHANGE BILIARY DRAIN  04/22/2022   IR EXCHANGE BILIARY DRAIN  07/15/2022   IR PERC CHOLECYSTOSTOMY  11/22/2018   IR RADIOLOGIST EVAL & MGMT  02/26/2019   IR RADIOLOGIST EVAL & MGMT  03/17/2019   IR SINUS/FIST TUBE CHK-NON GI  01/28/2019   LEAD REVISION N/A 01/11/2014   Procedure: LEAD REVISION;  Surgeon: Sanda Klein, MD;  Location: [facility] CATH LAB;  Service: Cardiovascular;  Laterality: N/A;   NM MYOCAR PERF WALL MOTION  01/29/2012   abnormal c/o infarct/scar,no ischemia present   PROSTATE BIOPSY N/A 11/22/2013   Procedure: PROSTATE BIOPSY AND ULTRASOUND;  Surgeon: Ailene Rud, MD;  Location: [facility];  Service: Urology;  Laterality: N/A;   REMOVAL OF STONES  01/29/2019   Procedure: REMOVAL OF STONES;  Surgeon: Milus Banister, MD;  Location: [facility] ENDOSCOPY;  Service: Endoscopy;;   RIGHT/LEFT HEART CATH AND CORONARY ANGIOGRAPHY N/A 07/06/2018   Procedure: RIGHT/LEFT HEART CATH AND CORONARY ANGIOGRAPHY;  Surgeon: Troy Sine, MD;  Location: [facility] CV LAB;  Service: Cardiovascular;  Laterality: N/A;   SPHINCTEROTOMY  01/29/2019   Procedure: SPHINCTEROTOMY;  Surgeon: Milus Banister, MD;  Location: [facility] ENDOSCOPY;  Service: Endoscopy;;   TEE WITHOUT CARDIOVERSION N/A 08/18/2018   Procedure: TRANSESOPHAGEAL ECHOCARDIOGRAM (TEE);  Surgeon: Burnell Blanks, MD;  Location: [facility];  Service: Open Heart Surgery;  Laterality: N/A;   TEE WITHOUT CARDIOVERSION N/A 09/08/2018   Procedure: TRANSESOPHAGEAL ECHOCARDIOGRAM (TEE);  Surgeon: Lelon Perla, MD;  Location: [facility] ENDOSCOPY;  Service: Cardiovascular;  Laterality: N/A;   TEE WITHOUT CARDIOVERSION N/A 09/15/2018    Procedure: TRANSESOPHAGEAL ECHOCARDIOGRAM (TEE);  Surgeon: Evans Lance, MD;  Location: [facility] OR;  Service: Cardiovascular;  Laterality: N/A;   TEE WITHOUT CARDIOVERSION N/A 11/26/2018   Procedure: TRANSESOPHAGEAL ECHOCARDIOGRAM (TEE);  Surgeon: Jerline Pain, MD;  Location: [facility] ENDOSCOPY;  Service: Cardiovascular;  Laterality: N/A;   TRANSCATHETER AORTIC VALVE REPLACEMENT, TRANSFEMORAL  08/18/2018   TRANSCATHETER AORTIC VALVE REPLACEMENT, TRANSFEMORAL N/A 08/18/2018   Procedure: TRANSCATHETER AORTIC VALVE REPLACEMENT, TRANSFEMORAL using an Edwards 34m Aortic Valve;  Surgeon: MBurnell Blanks MD;  Location: [facility]  Service: Open Heart Surgery;  Laterality: N/A;    Family History  Problem Relation Age of Onset   Heart failure Mother        Died of "old age" at 943  Pneumonia Father       Social History   Socioeconomic History   Marital status: Married    Spouse name: Not on file   Number of children: 2   Years of education: Not on file   Highest education level: Not on file  Occupational History   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[employer]"  Tobacco Use   Smoking status: Never   Smokeless tobacco: Never  Vaping Use   Vaping Use: Never used  Substance and Sexual Activity   Alcohol use: Not Currently    Alcohol/week: 0.0 standard drinks of alcohol    Comment: 01/11/2014 "drink 1/2 of a beer twice per month    Drug use: No   Sexual activity: Not Currently  Other Topics Concern   Not on file  Social History Narrative   Not on file   Social Determinants of Health  Financial Resource Strain: Not on file  Food Insecurity: Not on file  Transportation Needs: Not on file  Physical Activity: Not on file  Stress: Not on file  Social Connections: Not on file    Allergies  Allergen Reactions   Crestor [Rosuvastatin] Other (See Comments)    Hurts muscles   Lipitor [Atorvastatin] Other (See Comments)    Hurts stomach     Current Outpatient Medications:    amiodarone  (PACERONE) 200 MG tablet, TAKE 1 TABLET(200 MG) BY MOUTH DAILY, Disp: 90 tablet, Rfl: 0   aspirin EC 81 MG tablet, Take 81 mg by mouth every Monday, Wednesday, and Friday., Disp: , Rfl:    cholecalciferol (VITAMIN D) 1000 units tablet, Take 2,000 Units by mouth at bedtime., Disp: , Rfl:    Cranberry-Vit C-Lactobacillus (RA CRANBERRY SUPPLEMENTS PO), Take by mouth in the morning and at bedtime. , Disp: , Rfl:    fexofenadine (ALLEGRA) 60 MG tablet, Take 60 mg by mouth daily., Disp: , Rfl:    Multiple Vitamins-Minerals (PRESERVISION AREDS 2 PO), Take by mouth. Take one tablet by mouth dailey , Disp: , Rfl:    polyethylene glycol (MIRALAX / GLYCOLAX) 17 g packet, Take 17 g by mouth as needed., Disp: , Rfl:    potassium chloride (KLOR-CON) 10 MEQ tablet, TAKE 1 TABLET BY MOUTH EVERY DAY, Disp: 30 tablet, Rfl: 0   Probiotic Product (PROBIOTIC DAILY PO), Take 1 capsule by mouth daily., Disp: , Rfl:    Sodium Chloride Flush (NORMAL SALINE FLUSH) 0.9 % SOLN, Use 10 mLs by Intracatheter route as needed., Disp: 900 mL, Rfl: PRN   ursodiol (ACTIGALL) 300 MG capsule, Take 2 capsules (600 mg total) by mouth 2 (two) times daily., Disp: 360 capsule, Rfl: 3   zinc gluconate 50 MG tablet, Take 50 mg by mouth daily., Disp: , Rfl:    nitrofurantoin, macrocrystal-monohydrate, (MACROBID) 100 MG capsule, Take 1 capsule (100 mg total) by mouth 2 (two) times daily. (Patient not taking: Reported on 07/11/2022), Disp: 14 capsule, Rfl: 0   Review of Systems  Unable to perform ROS: Dementia           Assessment & Plan:     Observations/Objective:  William [name] appears to be relatively unchanged clinically from what his wife has described   Assessment and Plan: Broken crown from tooth: Wife is going to talk to dental about this.  Recurrent cholangitis: Has not recurred since his trip to [facility] with removal of stones and placement of drain.  Urinary tract infection has not had another 1 of these for more than a  year.    Goals of care: Currently wanting treatable things but DNR/DNI.    Marland KitchenFollow Up Instructions:    I discussed the assessment and treatment plan with the patient. The patient was provided an opportunity to ask questions and all were answered. The patient agreed with the plan and demonstrated an understanding of the instructions.   The patient was advised to call back or seek an in-person evaluation if the symptoms worsen or if the condition fails to improve as anticipated.  I provided 21 minutes of non-face-to-face time during this encounter.   Alcide Evener, MD

## 2022-08-12 ENCOUNTER — Other Ambulatory Visit: Payer: Self-pay | Admitting: Cardiovascular Disease

## 2022-10-17 ENCOUNTER — Other Ambulatory Visit (HOSPITAL_COMMUNITY): Payer: Self-pay

## 2022-10-21 ENCOUNTER — Ambulatory Visit (HOSPITAL_COMMUNITY)
Admission: RE | Admit: 2022-10-21 | Discharge: 2022-10-21 | Disposition: A | Discharge status: Home / Self Care | Payer: Medicare Other | Source: Ambulatory Visit | Attending: Interventional Radiology | Admitting: Interventional Radiology

## 2022-10-21 ENCOUNTER — Other Ambulatory Visit (HOSPITAL_COMMUNITY): Payer: Self-pay | Admitting: Interventional Radiology

## 2022-10-21 DIAGNOSIS — K8050 Calculus of bile duct without cholangitis or cholecystitis without obstruction: Secondary | ICD-10-CM | POA: Diagnosis not present

## 2022-10-21 DIAGNOSIS — Z434 Encounter for attention to other artificial openings of digestive tract: Secondary | ICD-10-CM | POA: Diagnosis not present

## 2022-10-21 HISTORY — PX: IR EXCHANGE BILIARY DRAIN: IMG6046

## 2022-10-21 MED ORDER — IOHEXOL 300 MG/ML  SOLN: 7.0000 mL | Freq: Once | INTRAMUSCULAR | Status: DC | PRN

## 2022-10-21 MED ORDER — LIDOCAINE HCL 1 % IJ SOLN
INTRAMUSCULAR | Status: AC
  Filled 2022-10-21: qty 20

## 2022-11-27 DIAGNOSIS — X32XXXA Exposure to sunlight, initial encounter: Secondary | ICD-10-CM | POA: Diagnosis not present

## 2022-11-27 DIAGNOSIS — B078 Other viral warts: Secondary | ICD-10-CM | POA: Diagnosis not present

## 2022-11-27 DIAGNOSIS — L820 Inflamed seborrheic keratosis: Secondary | ICD-10-CM | POA: Diagnosis not present

## 2022-11-27 DIAGNOSIS — L570 Actinic keratosis: Secondary | ICD-10-CM | POA: Diagnosis not present

## 2022-12-16 ENCOUNTER — Other Ambulatory Visit: Payer: Self-pay

## 2022-12-16 MED ORDER — POTASSIUM CHLORIDE ER 10 MEQ PO TBCR: 10.0000 meq | EXTENDED_RELEASE_TABLET | Freq: Every day | ORAL | 0 refills | Status: DC

## 2022-12-25 DIAGNOSIS — C44329 Squamous cell carcinoma of skin of other parts of face: Secondary | ICD-10-CM | POA: Diagnosis not present

## 2023-01-20 ENCOUNTER — Other Ambulatory Visit (HOSPITAL_COMMUNITY): Payer: Medicare Other

## 2023-01-23 ENCOUNTER — Ambulatory Visit (HOSPITAL_COMMUNITY)
Admission: RE | Admit: 2023-01-23 | Discharge: 2023-01-23 | Disposition: A | Discharge status: Home / Self Care | Payer: Medicare Other | Source: Ambulatory Visit | Attending: Interventional Radiology | Admitting: Interventional Radiology

## 2023-01-23 DIAGNOSIS — Z434 Encounter for attention to other artificial openings of digestive tract: Secondary | ICD-10-CM | POA: Diagnosis not present

## 2023-01-23 DIAGNOSIS — K8050 Calculus of bile duct without cholangitis or cholecystitis without obstruction: Secondary | ICD-10-CM | POA: Insufficient documentation

## 2023-01-23 HISTORY — PX: IR EXCHANGE BILIARY DRAIN: IMG6046

## 2023-01-23 MED ORDER — IOHEXOL 300 MG/ML  SOLN
50.0000 mL | Freq: Once | INTRAMUSCULAR | Status: AC | PRN
  Administered 2023-01-23: 10 mL

## 2023-01-23 NOTE — Procedures (Signed)
Pre procedural Dx: Chronic chole tube Post procedural Dx: Same  Technically successful fluoro guided of 14 Fr chole tube. Chole tube connected to gravity bag.  EBL: None Complications: None immediate  Katherina Right, MD Pager #: [phone]

## 2023-01-24 ENCOUNTER — Other Ambulatory Visit (HOSPITAL_COMMUNITY): Payer: Self-pay | Admitting: Interventional Radiology

## 2023-01-24 DIAGNOSIS — K8050 Calculus of bile duct without cholangitis or cholecystitis without obstruction: Secondary | ICD-10-CM

## 2023-02-02 NOTE — Progress Notes (Unsigned)
Virtual Visit via Telephone Note  I connected with [facility] [name] and his wife on 02/02/23 at 10:30 AM EDT  HCPO, his wife Public librarian by telephone and verified that I am speaking with the correct person using two identifiers.  Location: Patient: [facility] [name]: [name]   I discussed the limitations, risks, security and privacy concerns of performing an evaluation and management service by telephone and the availability of in person appointments. I also discussed with the patient's surrogate (his wife)  that there may be a patient responsible charge related to this service. The patients' surrogate expressed understanding and agreed to proceed.   History of Present Illness:  89 y.o. male with recurrent episodes of cholangitis and sepsis at times with bacteremias including a time when he had to have his ICD removed, again remitted admitted with cholangitis and sepsis in the context of an obstructed cholecystostomy tube, and concern for potential obstruction of his biliary tree due to stones.  He was found not to be a surgical candidate which seems quite reasonable to me given his multiple comorbidities including his dementia.   GI were able to remove multiple stones during his admission and cultures from the biliary tree yielded ampicillin sensitive enterococcal species and an Enterobacter species.  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   Patient was met with with palliative care on multiple occasions but his family continued to want "aggressive care".   They have been seeming to want to have a second opinion from a surgeon which seems not very realistic especially in the current environment.   Ultimately arrangements were made for him to continue on the meropenem and later changed to  Augmentin and Bactrim and has done  well.   He has been seen by interventional radiology which have changed his drain.  They cultured his bile  which had multiple calculi in it but it did not appear purulent or not documented as such. Culture was taken which I woud NOT have preferred UNLESS it was purulent. 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   He was seen at [facility] and underwent 1. Percutaneous gallstone extraction.   2. Cystic duct recanalization and balloon dilation   3. Cholecystostomy tube exchange.  on August the 4th, 2020   They found     1. Minimal residual cholelithiasis with successful clearance of the   remaining gallstones.   2. 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 The cystic duct is now widely   patent.   3. Choledocholithiasis without evidence of CBD obstruction.   4 Successful exchange of a new 16 french cholecystostomy tube.     Given the large amount of gallstones found in GB decision made to leave the cholecystostomy in place and have it exchanged every 3 months   I did  think we can trial him off of antibiotics this visit   The family wanted and have stopped the ACTIGALl though I would recommend continuing it. They are to talk with GI re this.   Kasem had in the interim developed swelling of what sounds like  his parotid gland that is been present for roughly 10 days.  IIt did  not improved he does not have fevers chills confusion or other systemic symptoms.  I was concerned however he could have a MRSA infection in this area that might be resistant to the Bactrim that he is been taking chronically. I placed him on doxycycline and this resolved.     Since then he developed some sleepiness along with a low-grade fever.  His urine had become darker and they had  send it for analysis and culture with his primary care physician.  The organism isolated was fairly unusual 1 and was resistant to most antimicrobials that were orally available other than Macrobid.  He was initially prescribed Bactrim but then later Macrobid and apparently the urine "cleared up.  However his sleepiness has persisted may have even worsened in the last week.   He is also developed a pressure ulcer on his sacral area that was evaluated by wound care    He was seen by interventional radiology and who changed his drain and noted that he had now obstruction of his cystic duct by a stone.   I was apprehensive that he may be harboringInfection now at this obstructed site that could put him at risk for recurrent cholangitis and worse case scenario recurrent bacteremia.  I arrangde for him to be seen in clinic in late October 2020.  On exam in clinic he is fairly somnolent wearing a mask could  barely answer any questions and is wearing mittens to restrain him from pulling out his drain.   In December 2020 he began having fevers that turned out that this was the beginning of COVID-19 infection.  Both he and his wife tested positive.  I was able to help his wife get in contact with the monoclonal antibody infusion clinic and she received a dose of monoclonal antibodies.  They were not able to get [name] out of the house to get him infused with antibodies.  He also was evaluated in the ER but felt stable for discharge back to [facility] and he and his wife both recovered from this infection at [facility].  He  had no further evidence of recurrent cholangitis.  His drain is again been change in the frequency of pain exchanges been changed every 5 weeks.  He has been off antibiotics   Drain exchange changed to every 10 weeks  He unfortunately has been nonverbal and largely bedbound since having his COVID-19 infection last year.  He has not had any evidence of infection at the biliary site.  He was  then hospitalized in September with an apparent urinary tract infection with a fairly sensitive E. coli.  He was seen by my partner Dr. Linus Salmons.  Antibiotics were appropriately narrowed and he was given appropriate duration of therapy.  Patien has been back at [facility].  He still has a drain that apparently is functioning quite well.   He is a DO NOT RESUSCITATE DO NOT INTUBATE at this point in time and has a DNR form and placard at his [facility].  Connected with [facility] [name] and family in November he has not had any evidence of recurrent cholangitis he remains on ursodiol to try to reduce chances of new stone formation his drain was exchanged in February 03, 2022.    I connected with [facility] [name] and his wife in May of 2023 and again no no problems with cholangitis or biliary infection.  When I spoke with [facility] [name]'s wife today she  told me that he has been eating food and goes to bed but really does not speak hardly at all.  He did apparently spit out a crown from one of his teeth yesterday.  His drain was changed by Dr. Greggory Stallion with IR on October  second 2023.  The contrast results showed that there is still a chronic blockage in the cystic duct but otherwise there is good flow throughout the biliary tree.  The drain exchange every 12 weeks.  No continues to have his drain exchange every 12 weeks.  He does not speak anymore at all even to his wife.  He is eating less and less but appears comfortable based on what his wife is told me.  He did have symptoms concerning for urinary tract infection last February and was prescribed Macrobid by me.  She was asked if that they could have an antibiotic on hand if he begins having evidence of a urinary tract infection again.       Past Medical History:  Diagnosis Date   AAA (abdominal aortic aneurysm)    7/13 3.8cm   Arthritis    "joints" (01/11/2014)   Asthma    "seasonal; some foods"    Chronic systolic CHF (congestive heart failure)    COVID-19 12/15/2019    Dementia    Goals of care, counseling/discussion 08/23/2021   HLD (hyperlipidemia)    HOH (hard of hearing)    Lumbar vertebral fracture    L1- 04/11/2014    Parotitis 06/10/2019   Prostate cancer    S/P ICD (internal cardiac defibrillator) procedure, 01/11/14 removal of ERI gen and placement of Medtronic Evera XT VR & NEW Right ventricular lead Medtronic 01/12/2014   S/P TAVR (transcatheter aortic valve replacement)    Edwards Sapien 3 THV (size 29 mm, model # K966601, serial # [id number])   Severe aortic stenosis    Sleep apnea    "lost 60# & don't have it anymore" (01/11/2014)   Somnolence 08/04/2019   UTI (urinary tract infection) 08/04/2019   Ventricular tachycardia     Past Surgical History:  Procedure Laterality Date   CARDIAC CATHETERIZATION  08/20/2004   noncritical CAD,mild global hypokinesis, EF 50%   CARDIAC DEFIBRILLATOR PLACEMENT  08/23/2004   Medtronic   CATARACT EXTRACTION W/ INTRAOCULAR LENS IMPLANT Right    COLECTOMY  1990's   CRYOABLATION N/A 02/24/2014   Procedure: CRYO ABLATION PROSTATE;  Surgeon: Kathi Ludwig, MD;  Location: [facility] ORS;  Service: Urology;  Laterality: N/A;   ERCP N/A 01/29/2019   Procedure: ENDOSCOPIC RETROGRADE CHOLANGIOPANCREATOGRAPHY (ERCP);  Surgeon: Rachael Fee, MD;  Location: [facility] ENDOSCOPY;  Service: Endoscopy;  Laterality: N/A;   HERNIA REPAIR     "abdomen; from colon OR"   ICD LEAD REMOVAL N/A 09/15/2018   Procedure: ICD LEAD REMOVAL EXTRACTION;  Surgeon: Marinus Maw, MD;  Location: [facility] OR;  Service: Cardiovascular;  Laterality: N/A;  DR. BARTLE TO BACK UP   IMPLANTABLE CARDIOVERTER DEFIBRILLATOR (ICD) GENERATOR CHANGE N/A 01/11/2014   Procedure: ICD GENERATOR CHANGE;  Surgeon: Thurmon Fair, MD;  Location: [facility] CATH LAB;  Service: Cardiovascular;  Laterality: N/A;   IR CATHETER TUBE CHANGE  03/01/2019   IR CATHETER TUBE CHANGE  04/05/2019   IR CATHETER TUBE CHANGE  07/05/2019   IR CATHETER TUBE CHANGE  08/02/2019   IR CATHETER TUBE  CHANGE  08/30/2019   IR CATHETER TUBE CHANGE  09/27/2019   IR CATHETER TUBE CHANGE  10/25/2019   IR CATHETER TUBE CHANGE  11/29/2019  IR EXCHANGE BILIARY DRAIN  12/18/2018   IR EXCHANGE BILIARY DRAIN  01/05/2019   IR EXCHANGE BILIARY DRAIN  01/25/2019   IR EXCHANGE BILIARY DRAIN  01/13/2020   IR EXCHANGE BILIARY DRAIN  02/28/2020   IR EXCHANGE BILIARY DRAIN  04/24/2020   IR EXCHANGE BILIARY DRAIN  07/03/2020   IR EXCHANGE BILIARY DRAIN  09/14/2020   IR EXCHANGE BILIARY DRAIN  11/27/2020   IR EXCHANGE BILIARY DRAIN  01/23/2021   IR EXCHANGE BILIARY DRAIN  03/30/2021   IR EXCHANGE BILIARY DRAIN  06/08/2021   IR EXCHANGE BILIARY DRAIN  08/17/2021   IR EXCHANGE BILIARY DRAIN  11/12/2021   IR EXCHANGE BILIARY DRAIN  02/04/2022   IR EXCHANGE BILIARY DRAIN  04/22/2022   IR EXCHANGE BILIARY DRAIN  07/15/2022   IR EXCHANGE BILIARY DRAIN  10/21/2022   IR EXCHANGE BILIARY DRAIN  01/23/2023   IR PERC CHOLECYSTOSTOMY  11/22/2018   IR RADIOLOGIST EVAL & MGMT  02/26/2019   IR RADIOLOGIST EVAL & MGMT  03/17/2019   IR SINUS/FIST TUBE CHK-NON GI  01/28/2019   LEAD REVISION N/A 01/11/2014   Procedure: LEAD REVISION;  Surgeon: Thurmon Fair, MD;  Location: [facility] CATH LAB;  Service: Cardiovascular;  Laterality: N/A;   NM MYOCAR PERF WALL MOTION  01/29/2012   abnormal c/o infarct/scar,no ischemia present   PROSTATE BIOPSY N/A 11/22/2013   Procedure: PROSTATE BIOPSY AND ULTRASOUND;  Surgeon: Kathi Ludwig, MD;  Location: [facility] ORS;  Service: Urology;  Laterality: N/A;   REMOVAL OF STONES  01/29/2019   Procedure: REMOVAL OF STONES;  Surgeon: Rachael Fee, MD;  Location: [facility] ENDOSCOPY;  Service: Endoscopy;;   RIGHT/LEFT HEART CATH AND CORONARY ANGIOGRAPHY N/A 07/06/2018   Procedure: RIGHT/LEFT HEART CATH AND CORONARY ANGIOGRAPHY;  Surgeon: Lennette Bihari, MD;  Location: [facility] INVASIVE CV LAB;  Service: Cardiovascular;  Laterality: N/A;   SPHINCTEROTOMY  01/29/2019   Procedure: SPHINCTEROTOMY;  Surgeon: Rachael Fee, MD;  Location:  [facility] ENDOSCOPY;  Service: Endoscopy;;   TEE WITHOUT CARDIOVERSION N/A 08/18/2018   Procedure: TRANSESOPHAGEAL ECHOCARDIOGRAM (TEE);  Surgeon: Kathleene Hazel, MD;  Location: [facility] OR;  Service: Open Heart Surgery;  Laterality: N/A;   TEE WITHOUT CARDIOVERSION N/A 09/08/2018   Procedure: TRANSESOPHAGEAL ECHOCARDIOGRAM (TEE);  Surgeon: Lewayne Bunting, MD;  Location: [facility] ENDOSCOPY;  Service: Cardiovascular;  Laterality: N/A;   TEE WITHOUT CARDIOVERSION N/A 09/15/2018   Procedure: TRANSESOPHAGEAL ECHOCARDIOGRAM (TEE);  Surgeon: Marinus Maw, MD;  Location: [facility] OR;  Service: Cardiovascular;  Laterality: N/A;   TEE WITHOUT CARDIOVERSION N/A 11/26/2018   Procedure: TRANSESOPHAGEAL ECHOCARDIOGRAM (TEE);  Surgeon: Jake Bathe, MD;  Location: [facility] ENDOSCOPY;  Service: Cardiovascular;  Laterality: N/A;   TRANSCATHETER AORTIC VALVE REPLACEMENT, TRANSFEMORAL  08/18/2018   TRANSCATHETER AORTIC VALVE REPLACEMENT, TRANSFEMORAL N/A 08/18/2018   Procedure: TRANSCATHETER AORTIC VALVE REPLACEMENT, TRANSFEMORAL using an Edwards 29mm Aortic Valve;  Surgeon: Kathleene Hazel, MD;  Location: [facility] OR;  Service: Open Heart Surgery;  Laterality: N/A;    Family History  Problem Relation Age of Onset   Heart failure Mother        Died of "old age" at 58   Pneumonia Father       Social History   Socioeconomic History   Marital status: Married    Spouse name: Not on file   Number of children: 2   Years of education: Not on file   Highest education level: Not on file  Occupational History   Occupation: Owned a [employer]"  Tobacco Use   Smoking status: Never   Smokeless tobacco: Never  Vaping Use   Vaping Use: Never used  Substance and Sexual Activity   Alcohol use: Not Currently    Alcohol/week: 0.0 standard drinks of alcohol    Comment: 01/11/2014 "drink 1/2 of a beer twice per month    Drug use: No   Sexual activity: Not Currently  Other Topics Concern   Not on file  Social History  Narrative   Not on file   Social Determinants of Health   Financial Resource Strain: Not on file  Food Insecurity: Not on file  Transportation Needs: Not on file  Physical Activity: Not on file  Stress: Not on file  Social Connections: Not on file    Allergies  Allergen Reactions   Crestor [Rosuvastatin] Other (See Comments)    Hurts muscles   Lipitor [Atorvastatin] Other (See Comments)    Hurts stomach     Current Outpatient Medications:    amiodarone (PACERONE) 200 MG tablet, TAKE 1 TABLET(200 MG) BY MOUTH DAILY, Disp: 90 tablet, Rfl: 3   aspirin EC 81 MG tablet, Take 81 mg by mouth every Monday, Wednesday, and Friday., Disp: , Rfl:    cholecalciferol (VITAMIN D) 1000 units tablet, Take 2,000 Units by mouth at bedtime., Disp: , Rfl:    Cranberry-Vit C-Lactobacillus (RA CRANBERRY SUPPLEMENTS PO), Take by mouth in the morning and at bedtime. , Disp: , Rfl:    fexofenadine (ALLEGRA) 60 MG tablet, Take 60 mg by mouth daily., Disp: , Rfl:    Multiple Vitamins-Minerals (PRESERVISION AREDS 2 PO), Take by mouth. Take one tablet by mouth dailey , Disp: , Rfl:    nitrofurantoin, macrocrystal-monohydrate, (MACROBID) 100 MG capsule, Take 1 capsule (100 mg total) by mouth 2 (two) times daily. (Patient not taking: Reported on 07/11/2022), Disp: 14 capsule, Rfl: 0   polyethylene glycol (MIRALAX / GLYCOLAX) 17 g packet, Take 17 g by mouth as needed., Disp: , Rfl:    potassium chloride (KLOR-CON) 10 MEQ tablet, Take 1 tablet (10 mEq total) by mouth daily., Disp: 90 tablet, Rfl: 0   Probiotic Product (PROBIOTIC DAILY PO), Take 1 capsule by mouth daily., Disp: , Rfl:    Sodium Chloride Flush (NORMAL SALINE FLUSH) 0.9 % SOLN, Use 10 mLs by Intracatheter route as needed., Disp: 900 mL, Rfl: PRN   zinc gluconate 50 MG tablet, Take 50 mg by mouth daily., Disp: , Rfl:    Review of Systems  Unable to perform ROS: Dementia           Assessment & Plan:     Observations/Objective:  [facility] [name]  appears stable from a cognitive standpoint though he seems like he is declining in terms of his food intake and weight   Assessment and Plan: Current cholangitis has not had no recurrences since his trip to [facility] with removal of stones and placement of drain.  Urinary tract infections:  No evidence of an infection currently I will send in cefadroxil for 3-day course that he can have on hand to take if he has evidence of an actual urinary tract infection  Goals of care DO NOT RESUSCITATE DO NOT INTUBATE.  He would want treatment though if he was "sick"   .Follow Up Instructions:    I discussed the assessment and treatment plan with the patient. The patient was provided an opportunity to ask questions and all were answered. The patient agreed with the plan and demonstrated an understanding of the instructions.  The patient was advised to call back or seek an in-person evaluation if the symptoms worsen or if the condition fails to improve as anticipated.  I provided 21 minutes of non-face-to-face time during this encounter.   Acey Lav, MD

## 2023-02-03 ENCOUNTER — Ambulatory Visit (INDEPENDENT_AMBULATORY_CARE_PROVIDER_SITE_OTHER): Payer: Medicare Other | Admitting: Infectious Disease

## 2023-02-03 ENCOUNTER — Other Ambulatory Visit: Payer: Self-pay

## 2023-02-03 ENCOUNTER — Encounter: Payer: Self-pay | Admitting: Infectious Disease

## 2023-02-03 DIAGNOSIS — K8050 Calculus of bile duct without cholangitis or cholecystitis without obstruction: Secondary | ICD-10-CM | POA: Diagnosis not present

## 2023-02-03 DIAGNOSIS — K8309 Other cholangitis: Secondary | ICD-10-CM | POA: Diagnosis not present

## 2023-02-03 DIAGNOSIS — N390 Urinary tract infection, site not specified: Secondary | ICD-10-CM

## 2023-02-03 DIAGNOSIS — B961 Klebsiella pneumoniae [K. pneumoniae] as the cause of diseases classified elsewhere: Secondary | ICD-10-CM | POA: Diagnosis not present

## 2023-02-03 DIAGNOSIS — K8064 Calculus of gallbladder and bile duct with chronic cholecystitis without obstruction: Secondary | ICD-10-CM | POA: Diagnosis not present

## 2023-02-03 DIAGNOSIS — B9689 Other specified bacterial agents as the cause of diseases classified elsewhere: Secondary | ICD-10-CM | POA: Diagnosis not present

## 2023-02-03 DIAGNOSIS — F03C0 Unspecified dementia, severe, without behavioral disturbance, psychotic disturbance, mood disturbance, and anxiety: Secondary | ICD-10-CM

## 2023-02-03 DIAGNOSIS — R7881 Bacteremia: Secondary | ICD-10-CM | POA: Diagnosis not present

## 2023-02-03 DIAGNOSIS — Z95810 Presence of automatic (implantable) cardiac defibrillator: Secondary | ICD-10-CM

## 2023-02-03 DIAGNOSIS — C61 Malignant neoplasm of prostate: Secondary | ICD-10-CM | POA: Diagnosis not present

## 2023-02-03 MED ORDER — CEFADROXIL 500 MG PO CAPS: 500.0000 mg | ORAL_CAPSULE | Freq: Two times a day (BID) | ORAL | 2 refills | Status: DC

## 2023-02-05 DIAGNOSIS — C44319 Basal cell carcinoma of skin of other parts of face: Secondary | ICD-10-CM | POA: Diagnosis not present

## 2023-02-05 DIAGNOSIS — Z0189 Encounter for other specified special examinations: Secondary | ICD-10-CM | POA: Diagnosis not present

## 2023-02-05 DIAGNOSIS — C44329 Squamous cell carcinoma of skin of other parts of face: Secondary | ICD-10-CM | POA: Diagnosis not present

## 2023-02-12 ENCOUNTER — Other Ambulatory Visit: Payer: Self-pay

## 2023-02-13 ENCOUNTER — Other Ambulatory Visit: Payer: Self-pay

## 2023-02-13 ENCOUNTER — Other Ambulatory Visit (HOSPITAL_COMMUNITY): Payer: Self-pay

## 2023-02-21 ENCOUNTER — Other Ambulatory Visit (HOSPITAL_COMMUNITY): Payer: Self-pay

## 2023-03-21 ENCOUNTER — Inpatient Hospital Stay (HOSPITAL_COMMUNITY): Payer: Medicare Other

## 2023-03-21 ENCOUNTER — Inpatient Hospital Stay (HOSPITAL_COMMUNITY)
Admission: EM | Admit: 2023-03-21 | Discharge: 2023-04-14 | DRG: 871 | Disposition: E | Payer: Medicare Other | Attending: Pulmonary Disease | Admitting: Pulmonary Disease

## 2023-03-21 ENCOUNTER — Other Ambulatory Visit: Payer: Self-pay

## 2023-03-21 ENCOUNTER — Emergency Department (HOSPITAL_COMMUNITY): Payer: Medicare Other

## 2023-03-21 DIAGNOSIS — Z66 Do not resuscitate: Secondary | ICD-10-CM | POA: Diagnosis present

## 2023-03-21 DIAGNOSIS — K460 Unspecified abdominal hernia with obstruction, without gangrene: Secondary | ICD-10-CM | POA: Diagnosis present

## 2023-03-21 DIAGNOSIS — J9601 Acute respiratory failure with hypoxia: Secondary | ICD-10-CM | POA: Diagnosis present

## 2023-03-21 DIAGNOSIS — I9581 Postprocedural hypotension: Secondary | ICD-10-CM | POA: Diagnosis not present

## 2023-03-21 DIAGNOSIS — K5669 Other partial intestinal obstruction: Secondary | ICD-10-CM | POA: Diagnosis not present

## 2023-03-21 DIAGNOSIS — Z8546 Personal history of malignant neoplasm of prostate: Secondary | ICD-10-CM

## 2023-03-21 DIAGNOSIS — E872 Acidosis, unspecified: Secondary | ICD-10-CM | POA: Diagnosis present

## 2023-03-21 DIAGNOSIS — Z7401 Bed confinement status: Secondary | ICD-10-CM

## 2023-03-21 DIAGNOSIS — Z8616 Personal history of COVID-19: Secondary | ICD-10-CM

## 2023-03-21 DIAGNOSIS — R739 Hyperglycemia, unspecified: Secondary | ICD-10-CM | POA: Diagnosis not present

## 2023-03-21 DIAGNOSIS — R109 Unspecified abdominal pain: Secondary | ICD-10-CM | POA: Diagnosis not present

## 2023-03-21 DIAGNOSIS — R6521 Severe sepsis with septic shock: Secondary | ICD-10-CM | POA: Diagnosis present

## 2023-03-21 DIAGNOSIS — E785 Hyperlipidemia, unspecified: Secondary | ICD-10-CM | POA: Diagnosis present

## 2023-03-21 DIAGNOSIS — J9811 Atelectasis: Secondary | ICD-10-CM | POA: Diagnosis present

## 2023-03-21 DIAGNOSIS — Z515 Encounter for palliative care: Secondary | ICD-10-CM

## 2023-03-21 DIAGNOSIS — I051 Rheumatic mitral insufficiency: Secondary | ICD-10-CM | POA: Diagnosis present

## 2023-03-21 DIAGNOSIS — I714 Abdominal aortic aneurysm, without rupture, unspecified: Secondary | ICD-10-CM | POA: Diagnosis present

## 2023-03-21 DIAGNOSIS — I2510 Atherosclerotic heart disease of native coronary artery without angina pectoris: Secondary | ICD-10-CM | POA: Diagnosis present

## 2023-03-21 DIAGNOSIS — J690 Pneumonitis due to inhalation of food and vomit: Secondary | ICD-10-CM | POA: Diagnosis present

## 2023-03-21 DIAGNOSIS — R0603 Acute respiratory distress: Secondary | ICD-10-CM | POA: Diagnosis not present

## 2023-03-21 DIAGNOSIS — H9190 Unspecified hearing loss, unspecified ear: Secondary | ICD-10-CM | POA: Diagnosis present

## 2023-03-21 DIAGNOSIS — I700 Atherosclerosis of aorta: Secondary | ICD-10-CM | POA: Diagnosis not present

## 2023-03-21 DIAGNOSIS — J45909 Unspecified asthma, uncomplicated: Secondary | ICD-10-CM | POA: Diagnosis present

## 2023-03-21 DIAGNOSIS — F0390 Unspecified dementia without behavioral disturbance: Secondary | ICD-10-CM | POA: Diagnosis present

## 2023-03-21 DIAGNOSIS — Z952 Presence of prosthetic heart valve: Secondary | ICD-10-CM | POA: Diagnosis not present

## 2023-03-21 DIAGNOSIS — I5022 Chronic systolic (congestive) heart failure: Secondary | ICD-10-CM | POA: Diagnosis present

## 2023-03-21 DIAGNOSIS — M1990 Unspecified osteoarthritis, unspecified site: Secondary | ICD-10-CM | POA: Diagnosis present

## 2023-03-21 DIAGNOSIS — I472 Ventricular tachycardia, unspecified: Secondary | ICD-10-CM | POA: Diagnosis present

## 2023-03-21 DIAGNOSIS — G928 Other toxic encephalopathy: Secondary | ICD-10-CM | POA: Diagnosis not present

## 2023-03-21 DIAGNOSIS — I428 Other cardiomyopathies: Secondary | ICD-10-CM | POA: Diagnosis present

## 2023-03-21 DIAGNOSIS — R404 Transient alteration of awareness: Secondary | ICD-10-CM | POA: Diagnosis not present

## 2023-03-21 DIAGNOSIS — Z4659 Encounter for fitting and adjustment of other gastrointestinal appliance and device: Secondary | ICD-10-CM | POA: Diagnosis not present

## 2023-03-21 DIAGNOSIS — K559 Vascular disorder of intestine, unspecified: Secondary | ICD-10-CM | POA: Diagnosis present

## 2023-03-21 DIAGNOSIS — I213 ST elevation (STEMI) myocardial infarction of unspecified site: Secondary | ICD-10-CM | POA: Diagnosis not present

## 2023-03-21 DIAGNOSIS — A419 Sepsis, unspecified organism: Principal | ICD-10-CM | POA: Diagnosis present

## 2023-03-21 DIAGNOSIS — Z8249 Family history of ischemic heart disease and other diseases of the circulatory system: Secondary | ICD-10-CM

## 2023-03-21 DIAGNOSIS — N179 Acute kidney failure, unspecified: Secondary | ICD-10-CM | POA: Diagnosis not present

## 2023-03-21 DIAGNOSIS — R112 Nausea with vomiting, unspecified: Secondary | ICD-10-CM

## 2023-03-21 DIAGNOSIS — N170 Acute kidney failure with tubular necrosis: Secondary | ICD-10-CM | POA: Diagnosis present

## 2023-03-21 DIAGNOSIS — I499 Cardiac arrhythmia, unspecified: Secondary | ICD-10-CM | POA: Diagnosis not present

## 2023-03-21 DIAGNOSIS — Z7982 Long term (current) use of aspirin: Secondary | ICD-10-CM

## 2023-03-21 DIAGNOSIS — I6381 Other cerebral infarction due to occlusion or stenosis of small artery: Secondary | ICD-10-CM | POA: Diagnosis not present

## 2023-03-21 DIAGNOSIS — R000 Tachycardia, unspecified: Secondary | ICD-10-CM | POA: Diagnosis not present

## 2023-03-21 DIAGNOSIS — Z79899 Other long term (current) drug therapy: Secondary | ICD-10-CM

## 2023-03-21 DIAGNOSIS — R4182 Altered mental status, unspecified: Secondary | ICD-10-CM | POA: Diagnosis not present

## 2023-03-21 DIAGNOSIS — Z888 Allergy status to other drugs, medicaments and biological substances status: Secondary | ICD-10-CM

## 2023-03-21 DIAGNOSIS — I4510 Unspecified right bundle-branch block: Secondary | ICD-10-CM | POA: Diagnosis not present

## 2023-03-21 LAB — COMPREHENSIVE METABOLIC PANEL
ALT: 12 U/L (ref 0–44)
AST: 26 U/L (ref 15–41)
Albumin: 2.9 g/dL — ABNORMAL LOW (ref 3.5–5.0)
Alkaline Phosphatase: 123 U/L (ref 38–126)
Anion gap: 21 — ABNORMAL HIGH (ref 5–15)
BUN: 36 mg/dL — ABNORMAL HIGH (ref 8–23)
CO2: 15 mmol/L — ABNORMAL LOW (ref 22–32)
Calcium: 8.9 mg/dL (ref 8.9–10.3)
Chloride: 100 mmol/L (ref 98–111)
Creatinine, Ser: 1.85 mg/dL — ABNORMAL HIGH (ref 0.61–1.24)
GFR, Estimated: 34 mL/min — ABNORMAL LOW (ref >60)
Glucose, Bld: 279 mg/dL — ABNORMAL HIGH (ref 70–99)
Potassium: 4.9 mmol/L (ref 3.5–5.1)
Sodium: 136 mmol/L (ref 135–145)
Total Bilirubin: 2.2 mg/dL — ABNORMAL HIGH (ref 0.3–1.2)
Total Protein: 6.9 g/dL (ref 6.5–8.1)

## 2023-03-21 LAB — CBC WITH DIFFERENTIAL/PLATELET
Abs Immature Granulocytes: 0.1 10*3/uL — ABNORMAL HIGH (ref 0.00–0.07)
Basophils Absolute: 0 10*3/uL (ref 0.0–0.1)
Basophils Relative: 0 %
Eosinophils Absolute: 0 10*3/uL (ref 0.0–0.5)
Eosinophils Relative: 0 %
HCT: 46.8 % (ref 39.0–52.0)
Hemoglobin: 16 g/dL (ref 13.0–17.0)
Immature Granulocytes: 1 %
Lymphocytes Relative: 2 %
Lymphs Abs: 0.3 10*3/uL — ABNORMAL LOW (ref 0.7–4.0)
MCH: 34.1 pg — ABNORMAL HIGH (ref 26.0–34.0)
MCHC: 34.2 g/dL (ref 30.0–36.0)
MCV: 99.8 fL (ref 80.0–100.0)
Monocytes Absolute: 2.4 10*3/uL — ABNORMAL HIGH (ref 0.1–1.0)
Monocytes Relative: 11 %
Neutro Abs: 18.2 10*3/uL — ABNORMAL HIGH (ref 1.7–7.7)
Neutrophils Relative %: 86 %
Platelets: 197 10*3/uL (ref 150–400)
RBC: 4.69 MIL/uL (ref 4.22–5.81)
RDW: 16.8 % — ABNORMAL HIGH (ref 11.5–15.5)
WBC: 21 10*3/uL — ABNORMAL HIGH (ref 4.0–10.5)
nRBC: 0.1 % (ref 0.0–0.2)

## 2023-03-21 LAB — I-STAT VENOUS BLOOD GAS, ED
Acid-base deficit: 5 mmol/L — ABNORMAL HIGH (ref 0.0–2.0)
Bicarbonate: 18.6 mmol/L — ABNORMAL LOW (ref 20.0–28.0)
Calcium, Ion: 1.04 mmol/L — ABNORMAL LOW (ref 1.15–1.40)
HCT: 46 % (ref 39.0–52.0)
Hemoglobin: 15.6 g/dL (ref 13.0–17.0)
O2 Saturation: 98 %
Potassium: 4.6 mmol/L (ref 3.5–5.1)
Sodium: 135 mmol/L (ref 135–145)
TCO2: 20 mmol/L — ABNORMAL LOW (ref 22–32)
pCO2, Ven: 31.1 mmHg — ABNORMAL LOW (ref 44–60)
pH, Ven: 7.385 (ref 7.25–7.43)
pO2, Ven: 104 mmHg — ABNORMAL HIGH (ref 32–45)

## 2023-03-21 LAB — I-STAT CHEM 8, ED
BUN: 34 mg/dL — ABNORMAL HIGH (ref 8–23)
Calcium, Ion: 1.05 mmol/L — ABNORMAL LOW (ref 1.15–1.40)
Chloride: 104 mmol/L (ref 98–111)
Creatinine, Ser: 1.6 mg/dL — ABNORMAL HIGH (ref 0.61–1.24)
Glucose, Bld: 276 mg/dL — ABNORMAL HIGH (ref 70–99)
HCT: 46 % (ref 39.0–52.0)
Hemoglobin: 15.6 g/dL (ref 13.0–17.0)
Potassium: 4.6 mmol/L (ref 3.5–5.1)
Sodium: 136 mmol/L (ref 135–145)
TCO2: 20 mmol/L — ABNORMAL LOW (ref 22–32)

## 2023-03-21 LAB — TROPONIN I (HIGH SENSITIVITY): Troponin I (High Sensitivity): 29 ng/L — ABNORMAL HIGH (ref <18)

## 2023-03-21 MED ORDER — FENTANYL 2500MCG IN NS 250ML (10MCG/ML) PREMIX INFUSION
0.0000 ug/h | INTRAVENOUS | Status: DC
  Administered 2023-03-22: 100 ug/h via INTRAVENOUS
  Filled 2023-03-21: qty 250

## 2023-03-21 MED ORDER — AMIODARONE LOAD VIA INFUSION
150.0000 mg | Freq: Once | INTRAVENOUS | Status: AC
  Administered 2023-03-21: 150 mg via INTRAVENOUS
  Filled 2023-03-21: qty 83.34

## 2023-03-21 MED ORDER — PHENYLEPHRINE HCL (PRESSORS) 10 MG/ML IV SOLN
INTRAVENOUS | Status: DC | PRN
  Administered 2023-03-21 (×3): .2 mg via INTRAVENOUS

## 2023-03-21 MED ORDER — DOCUSATE SODIUM 50 MG/5ML PO LIQD
100.0000 mg | Freq: Two times a day (BID) | ORAL | Status: DC | PRN
  Administered 2023-03-22: 100 mg
  Filled 2023-03-21: qty 10

## 2023-03-21 MED ORDER — ETOMIDATE 2 MG/ML IV SOLN
INTRAVENOUS | Status: DC | PRN
  Administered 2023-03-21: 20 mg via INTRAVENOUS

## 2023-03-21 MED ORDER — SODIUM CHLORIDE 0.9 % IV SOLN
250.0000 mL | INTRAVENOUS | Status: DC
  Administered 2023-03-21: 250 mL via INTRAVENOUS

## 2023-03-21 MED ORDER — FENTANYL 2500MCG IN NS 250ML (10MCG/ML) PREMIX INFUSION
INTRAVENOUS | Status: AC
  Filled 2023-03-21: qty 250

## 2023-03-21 MED ORDER — MIDAZOLAM-SODIUM CHLORIDE 100-0.9 MG/100ML-% IV SOLN
INTRAVENOUS | Status: AC
  Filled 2023-03-21: qty 100

## 2023-03-21 MED ORDER — SODIUM CHLORIDE 0.9 % IV SOLN: 3.0000 g | Freq: Four times a day (QID) | INTRAVENOUS | Status: DC

## 2023-03-21 MED ORDER — NOREPINEPHRINE 4 MG/250ML-% IV SOLN
2.0000 ug/min | INTRAVENOUS | Status: DC
  Administered 2023-03-22: 30 ug/min via INTRAVENOUS
  Filled 2023-03-21 (×2): qty 250

## 2023-03-21 MED ORDER — FAMOTIDINE 20 MG PO TABS
20.0000 mg | ORAL_TABLET | Freq: Two times a day (BID) | ORAL | Status: DC
  Administered 2023-03-22 (×2): 20 mg
  Filled 2023-03-21 (×2): qty 1

## 2023-03-21 MED ORDER — FENTANYL BOLUS VIA INFUSION
25.0000 ug | INTRAVENOUS | Status: DC | PRN
  Administered 2023-03-22: 100 ug via INTRAVENOUS

## 2023-03-21 MED ORDER — ONDANSETRON HCL 4 MG/2ML IJ SOLN
4.0000 mg | Freq: Once | INTRAMUSCULAR | Status: AC
  Administered 2023-03-21: 4 mg via INTRAVENOUS

## 2023-03-21 MED ORDER — MIDAZOLAM-SODIUM CHLORIDE 100-0.9 MG/100ML-% IV SOLN: 0.0000 mg/h | INTRAVENOUS | Status: DC

## 2023-03-21 MED ORDER — LACTATED RINGERS IV BOLUS
2000.0000 mL | Freq: Once | INTRAVENOUS | Status: AC
  Administered 2023-03-21: 2000 mL via INTRAVENOUS

## 2023-03-21 MED ORDER — AMIODARONE HCL IN DEXTROSE 360-4.14 MG/200ML-% IV SOLN
60.0000 mg/h | INTRAVENOUS | Status: DC
  Administered 2023-03-21 – 2023-03-22 (×2): 60 mg/h via INTRAVENOUS
  Filled 2023-03-21 (×2): qty 200

## 2023-03-21 MED ORDER — POLYETHYLENE GLYCOL 3350 17 G PO PACK
17.0000 g | PACK | Freq: Every day | ORAL | Status: DC | PRN
  Administered 2023-03-22: 17 g via ORAL
  Filled 2023-03-21: qty 1

## 2023-03-21 MED ORDER — INSULIN ASPART 100 UNIT/ML IJ SOLN
0.0000 [IU] | INTRAMUSCULAR | Status: DC
  Administered 2023-03-22 (×2): 5 [IU] via SUBCUTANEOUS
  Administered 2023-03-22: 7 [IU] via SUBCUTANEOUS
  Administered 2023-03-22: 5 [IU] via SUBCUTANEOUS

## 2023-03-21 MED ORDER — VASOPRESSIN 20 UNITS/100 ML INFUSION FOR SHOCK
INTRAVENOUS | Status: AC
  Administered 2023-03-21: 0.03 [IU]/min via INTRAVENOUS
  Filled 2023-03-21: qty 100

## 2023-03-21 MED ORDER — ROCURONIUM BROMIDE 50 MG/5ML IV SOLN
INTRAVENOUS | Status: DC | PRN
  Administered 2023-03-21: 100 mg via INTRAVENOUS

## 2023-03-21 MED ORDER — RINGERS IV SOLN: INTRAVENOUS | Status: DC

## 2023-03-21 MED ORDER — AMIODARONE HCL IN DEXTROSE 360-4.14 MG/200ML-% IV SOLN: 30.0000 mg/h | INTRAVENOUS | Status: DC

## 2023-03-21 MED ORDER — LACTATED RINGERS IV BOLUS
4000.0000 mL | Freq: Once | INTRAVENOUS | Status: AC
  Administered 2023-03-22: 4000 mL via INTRAVENOUS

## 2023-03-21 MED ORDER — HEPARIN SODIUM (PORCINE) 5000 UNIT/ML IJ SOLN
5000.0000 [IU] | Freq: Three times a day (TID) | INTRAMUSCULAR | Status: DC
  Administered 2023-03-22: 5000 [IU] via SUBCUTANEOUS
  Filled 2023-03-21: qty 1

## 2023-03-21 MED ORDER — VASOPRESSIN 20 UNITS/100 ML INFUSION FOR SHOCK
0.0000 [IU]/min | INTRAVENOUS | Status: DC
  Filled 2023-03-21 (×2): qty 100

## 2023-03-21 MED ORDER — NOREPINEPHRINE 4 MG/250ML-% IV SOLN
INTRAVENOUS | Status: DC | PRN
  Administered 2023-03-21: 30 ug/min via INTRAVENOUS

## 2023-03-21 MED ORDER — DOCUSATE SODIUM 100 MG PO CAPS: 100.0000 mg | ORAL_CAPSULE | Freq: Two times a day (BID) | ORAL | Status: DC | PRN

## 2023-03-21 MED ORDER — PANTOPRAZOLE SODIUM 40 MG IV SOLR: 40.0000 mg | Freq: Every day | INTRAVENOUS | Status: DC

## 2023-03-21 MED ORDER — ONDANSETRON HCL 4 MG/2ML IJ SOLN
INTRAMUSCULAR | Status: AC
  Filled 2023-03-21: qty 2

## 2023-03-21 NOTE — H&P (Signed)
NAME:  MAXEMILIANO MONTNEY, MRN:  [id number], DOB:  [birth year], LOS: 1 ADMISSION DATE:  03/25/2023 CONSULTATION DATE:  03-25-2023 REFERRING MD:  Silverio Lay - EDP CHIEF COMPLAINT:  Unresponsive, concern for septic shock  History of Present Illness:  89 year old man who presented to [facility] ED 03/25/23 via EMS for unresponsiveness, found down. 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  Patient is intubated and sedated, therefore history is obtained from patient's wife at bedside.  Patient's wife states that patient began vomiting around 0200 day of admission; continued to vomit every few hours.  EMS was called after patient was later found somnolent with vomit coming from his mouth.  SpO2 80% on RA with audible rales on EMS arrival requiring NRB then bagging.   On ED arrival, patient was afebrile, HR 148, BP 110/89, RR 14, SpO2 100%.  Intubated for airway protection in ED. CXR showed chronic left lung volume loss and pleuroparenchymal opacity with right infrahilar atelectasis.  Abdominal x-ray showed prominent air-filled small bowel, concerning for enteritis/ileus.  CT Head and CT A/P pending Labs were notable for WBC 21, Hgb 16, Plt 197; Na 136, K4.9, CO2 15, BUN 36/Cr 1.85 (baseline 0.8).  Transaminases/alk phos WNL, T. bili mildly elevated at 2.2.  Troponin 29.  VBG with pH 7.38/pCO2 31/bicarb 18.6.  Blood cultures pending.  Broad-spectrum meropenem/vancomycin initiated.  Levophed and vasopressin started.  PCCM consulted for ICU admission.  Pertinent Medical History:   Past Medical History:  Diagnosis Date   AAA (abdominal aortic aneurysm) (HCC)    7/13 3.8cm   Arthritis    "joints" (01/11/2014)   Asthma    "seasonal; some foods"    Chronic systolic  CHF (congestive heart failure) (HCC)    COVID-19 12/15/2019   Dementia (HCC)    Goals of care, counseling/discussion 08/23/2021   HLD (hyperlipidemia)    HOH (hard of hearing)    Lumbar vertebral fracture (HCC)    L1- 04/11/2014    Parotitis 06/10/2019   Prostate cancer (HCC)    S/P ICD (internal cardiac defibrillator) procedure, 01/11/14 removal of ERI gen and placement of Medtronic Evera XT VR & NEW Right ventricular lead Medtronic 01/12/2014   S/P TAVR (transcatheter aortic valve replacement)    Edwards Sapien 3 THV (size 29 mm, model # K966601, serial # [id number])   Severe aortic stenosis    Sleep apnea    "lost 60# & don't have it anymore" (01/11/2014)   Somnolence 08/04/2019   UTI (urinary tract infection) 08/04/2019   Ventricular tachycardia (HCC)    Significant Hospital Events: Including procedures, antibiotic start and stop dates in addition to other pertinent events   25-Mar-2023 - Presented to [facility] ED via EMS for respiratory distress, somnolence.  Intubated on ED arrival. Concern for septic shock, possibly related to chronic biliary tube/aspiration.  Started on broad-spectrum meropenem/Vanc. Admitted to ICU.  Interim History / Subjective:  PCCM consulted for ICU admission.  Objective:  Blood pressure 102/71, pulse (!) 125, temperature 98.3 F (36.8 C), temperature source Axillary, resp. rate 16, height 5\' 10"  (1.778 m), SpO2 100 %.    Vent Mode: PRVC FiO2 (%):  [100 %] 100 % Set Rate:  [16 bmp] 16 bmp Vt Set:  [500 mL] 500 mL PEEP:  [5 cmH20] 5 cmH20  No intake  or output data in the 24 hours ending 04/11/2023 0012 There were no vitals filed for this visit.  Physical Examination: General: Acute-on-chronically ill-appearing elderly man in NAD. Intubated, sedated. HEENT: [place]/AT, anicteric sclera, PERRL 3mm (sluggish), moist mucous membranes. Neuro: Sedated. Does not respond to verbal, tactile or noxious stimuli. Does not withdraw to pain. Not following commands. CV: Tachycardic,  regular rhythm, no m/g/r. PULM: Breathing even and unlabored on vent (PEEP 5, FiO2 100%). Lung fields rhonchorous throughout. GI: Soft, nontender, nondistended. Biliary drain to RUQ with thin yellow-amber liquid in bag, thick gold-colored sediment in tubing. Extremities: No LE edema noted. Skin: Warm/dry, no rashes.  Resolved Hospital Problem List:    Assessment & Plan:  Septic shock, most likely biliary sepsis, possible component of aspiration PNA - Admit to ICU for close monitoring - Goal MAP > 65 - Fluid resuscitation as tolerated - Levophed titrated to goal MAP + vaso - Trend WBC, fever curve - F/u Cx data - Continue broad-spectrum antibiotics (meropenem/vanc) - May require biliary tube removal/exchange if clinically stabilizing - F/u CT A/P  Acute toxic encephalopathy, likely septic etiology ?L-sided deficits (LUE) per family present x 1 week Dementia - F/u CT Head - Correct metabolic derangements - Limit sedating agents as able - Delirium precautions - Not on home medications for dementia  Acute hypoxemic respiratory failure Concern for aspiration PNA - Continue full vent support (4-8cc/kg IBW) - Wean FiO2 for O2 sat > 90% - Daily WUA/SBT - VAP bundle - Pulmonary hygiene - PAD protocol for sedation: Fentanyl for goal RASS 0 to -1  HFrEF NICM History of SVT/NSVT, s/p ICD (since removed) AS s/p TAVR Echo 11/2018 with EF 25-30%, diffuse LV hypokinesis, degenerative MV. - Amiodarone gtt for VT - F/u repeat Echo - Cardiac monitoring - Optimize electrolytes for K > 4, Mg > 2  Chronic biliary drain in situ History of recurrent cholangitis Followed at [facility], s/p multiple cholelithiasis extractions via IR. - Biliary drain remains in place - Tube management per protocol - Empiric antibiotic coverage as above  AKI, likely ATN in the setting of sepsis/hypotension - Trend BMP - Replete electrolytes as indicated - Monitor I&Os - Avoid nephrotoxic agents as able -  Ensure adequate renal perfusion  Chronically bedbound At risk for malnutrition Physical deconditioning, POA - TF per protocol - PT/OT/SLP when appropriate - Consider PMT engagement, patient has reportedly been offered palliative care in the past but family desired continued aggressive care  Best Practice: (right click and "Reselect all SmartList Selections" daily)   Diet/type: NPO DVT prophylaxis: SCDs GI prophylaxis: PPI Lines: N/A - will place femoral CVC/A-line Foley:  Yes, and it is still needed Code Status:  DNR Last date of multidisciplinary goals of care discussion [Confirmed wishes for DNR (no CPR, +intubation and medications, etc)]  Labs:  CBC: Recent Labs  Lab Apr 17, 2023 2225 04/17/23 2253  WBC 21.0*  --   NEUTROABS 18.2*  --   HGB 16.0 15.6  15.6  HCT 46.8 46.0  46.0  MCV 99.8  --   PLT 197  --    Basic Metabolic Panel: Recent Labs  Lab Apr 17, 2023 2225 April 17, 2023 2253  NA 136 136  135  K 4.9 4.6  4.6  CL 100 104  CO2 15*  --   GLUCOSE 279* 276*  BUN 36* 34*  CREATININE 1.85* 1.60*  CALCIUM 8.9  --    GFR: CrCl cannot be calculated (Unknown ideal weight.). Recent Labs  Lab 04/17/2023 2225  WBC 21.0*  Liver Function Tests: Recent Labs  Lab 03/20/2023 2225  AST 26  ALT 12  ALKPHOS 123  BILITOT 2.2*  PROT 6.9  ALBUMIN 2.9*   No results for input(s): "LIPASE", "AMYLASE" in the last 168 hours. No results for input(s): "AMMONIA" in the last 168 hours.  ABG:    Component Value Date/Time   PHART 7.407 11/23/2018 0751   PCO2ART 32.6 11/23/2018 0751   PO2ART 166.0 (H) 11/23/2018 0751   HCO3 18.6 (L) 04/09/2023 2253   TCO2 20 (L) 04/02/2023 2253   TCO2 20 (L) 04/07/2023 2253   ACIDBASEDEF 5.0 (H) 04/01/2023 2253   O2SAT 98 04/12/2023 2253    Coagulation Profile: No results for input(s): "INR", "PROTIME" in the last 168 hours.  Cardiac Enzymes: No results for input(s): "CKTOTAL", "CKMB", "CKMBINDEX", "TROPONINI" in the last 168  hours.  HbA1C: Hgb A1c MFr Bld  Date/Time Value Ref Range Status  01/27/2019 04:25 AM 5.7 (H) 4.8 - 5.6 % Final    Comment:    (NOTE) Pre diabetes:          5.7%-6.4% Diabetes:              >6.4% Glycemic control for   <7.0% adults with diabetes   01/26/2019 02:57 AM 5.9 (H) 4.8 - 5.6 % Final    Comment:    (NOTE)         Prediabetes: 5.7 - 6.4         Diabetes: >6.4         Glycemic control for adults with diabetes: <7.0    CBG: No results for input(s): "GLUCAP" in the last 168 hours.  Review of Systems:   Patient is intubated; therefore, history has been obtained from chart review.   Past Medical History:  He,  has a past medical history of AAA (abdominal aortic aneurysm) (HCC), Arthritis, Asthma, Chronic systolic CHF (congestive heart failure) (HCC), COVID-19 (12/15/2019), Dementia (HCC), Goals of care, counseling/discussion (08/23/2021), HLD (hyperlipidemia), HOH (hard of hearing), Lumbar vertebral fracture (HCC), Parotitis (06/10/2019), Prostate cancer (HCC), S/P ICD (internal cardiac defibrillator) procedure, 01/11/14 removal of ERI gen and placement of Medtronic Evera XT VR & NEW Right ventricular lead Medtronic (01/12/2014), S/P TAVR (transcatheter aortic valve replacement), Severe aortic stenosis, Sleep apnea, Somnolence (08/04/2019), UTI (urinary tract infection) (08/04/2019), and Ventricular tachycardia (HCC).   Surgical History:   Past Surgical History:  Procedure Laterality Date   CARDIAC CATHETERIZATION  08/20/2004   noncritical CAD,mild global hypokinesis, EF 50%   CARDIAC DEFIBRILLATOR PLACEMENT  08/23/2004   Medtronic   CATARACT EXTRACTION W/ INTRAOCULAR LENS IMPLANT Right    COLECTOMY  1990's   CRYOABLATION N/A 02/24/2014   Procedure: CRYO ABLATION PROSTATE;  Surgeon: Kathi Ludwig, MD;  Location: [facility] ORS;  Service: Urology;  Laterality: N/A;   ERCP N/A 01/29/2019   Procedure: ENDOSCOPIC RETROGRADE CHOLANGIOPANCREATOGRAPHY (ERCP);  Surgeon: Rachael Fee,  MD;  Location: [facility] ENDOSCOPY;  Service: Endoscopy;  Laterality: N/A;   HERNIA REPAIR     "abdomen; from colon OR"   ICD LEAD REMOVAL N/A 09/15/2018   Procedure: ICD LEAD REMOVAL EXTRACTION;  Surgeon: Marinus Maw, MD;  Location: [facility] OR;  Service: Cardiovascular;  Laterality: N/A;  DR. BARTLE TO BACK UP   IMPLANTABLE CARDIOVERTER DEFIBRILLATOR (ICD) GENERATOR CHANGE N/A 01/11/2014   Procedure: ICD GENERATOR CHANGE;  Surgeon: Thurmon Fair, MD;  Location: [facility] CATH LAB;  Service: Cardiovascular;  Laterality: N/A;   IR CATHETER TUBE CHANGE  03/01/2019   IR CATHETER TUBE CHANGE  04/05/2019   IR CATHETER TUBE CHANGE  07/05/2019   IR CATHETER TUBE CHANGE  08/02/2019   IR CATHETER TUBE CHANGE  08/30/2019   IR CATHETER TUBE CHANGE  09/27/2019   IR CATHETER TUBE CHANGE  10/25/2019   IR CATHETER TUBE CHANGE  11/29/2019   IR EXCHANGE BILIARY DRAIN  12/18/2018   IR EXCHANGE BILIARY DRAIN  01/05/2019   IR EXCHANGE BILIARY DRAIN  01/25/2019   IR EXCHANGE BILIARY DRAIN  01/13/2020   IR EXCHANGE BILIARY DRAIN  02/28/2020   IR EXCHANGE BILIARY DRAIN  04/24/2020   IR EXCHANGE BILIARY DRAIN  07/03/2020   IR EXCHANGE BILIARY DRAIN  09/14/2020   IR EXCHANGE BILIARY DRAIN  11/27/2020   IR EXCHANGE BILIARY DRAIN  01/23/2021   IR EXCHANGE BILIARY DRAIN  03/30/2021   IR EXCHANGE BILIARY DRAIN  06/08/2021   IR EXCHANGE BILIARY DRAIN  08/17/2021   IR EXCHANGE BILIARY DRAIN  11/12/2021   IR EXCHANGE BILIARY DRAIN  02/04/2022   IR EXCHANGE BILIARY DRAIN  04/22/2022   IR EXCHANGE BILIARY DRAIN  07/15/2022   IR EXCHANGE BILIARY DRAIN  10/21/2022   IR EXCHANGE BILIARY DRAIN  01/23/2023   IR PERC CHOLECYSTOSTOMY  11/22/2018   IR RADIOLOGIST EVAL & MGMT  02/26/2019   IR RADIOLOGIST EVAL & MGMT  03/17/2019   IR SINUS/FIST TUBE CHK-NON GI  01/28/2019   LEAD REVISION N/A 01/11/2014   Procedure: LEAD REVISION;  Surgeon: Thurmon Fair, MD;  Location: [facility] CATH LAB;  Service: Cardiovascular;  Laterality: N/A;   NM MYOCAR PERF WALL MOTION  01/29/2012    abnormal c/o infarct/scar,no ischemia present   PROSTATE BIOPSY N/A 11/22/2013   Procedure: PROSTATE BIOPSY AND ULTRASOUND;  Surgeon: Kathi Ludwig, MD;  Location: [facility] ORS;  Service: Urology;  Laterality: N/A;   REMOVAL OF STONES  01/29/2019   Procedure: REMOVAL OF STONES;  Surgeon: Rachael Fee, MD;  Location: [facility] ENDOSCOPY;  Service: Endoscopy;;   RIGHT/LEFT HEART CATH AND CORONARY ANGIOGRAPHY N/A 07/06/2018   Procedure: RIGHT/LEFT HEART CATH AND CORONARY ANGIOGRAPHY;  Surgeon: Lennette Bihari, MD;  Location: [facility] INVASIVE CV LAB;  Service: Cardiovascular;  Laterality: N/A;   SPHINCTEROTOMY  01/29/2019   Procedure: SPHINCTEROTOMY;  Surgeon: Rachael Fee, MD;  Location: [facility] ENDOSCOPY;  Service: Endoscopy;;   TEE WITHOUT CARDIOVERSION N/A 08/18/2018   Procedure: TRANSESOPHAGEAL ECHOCARDIOGRAM (TEE);  Surgeon: Kathleene Hazel, MD;  Location: [facility] OR;  Service: Open Heart Surgery;  Laterality: N/A;   TEE WITHOUT CARDIOVERSION N/A 09/08/2018   Procedure: TRANSESOPHAGEAL ECHOCARDIOGRAM (TEE);  Surgeon: Lewayne Bunting, MD;  Location: [facility] ENDOSCOPY;  Service: Cardiovascular;  Laterality: N/A;   TEE WITHOUT CARDIOVERSION N/A 09/15/2018   Procedure: TRANSESOPHAGEAL ECHOCARDIOGRAM (TEE);  Surgeon: Marinus Maw, MD;  Location: [facility] OR;  Service: Cardiovascular;  Laterality: N/A;   TEE WITHOUT CARDIOVERSION N/A 11/26/2018   Procedure: TRANSESOPHAGEAL ECHOCARDIOGRAM (TEE);  Surgeon: Jake Bathe, MD;  Location: [facility] ENDOSCOPY;  Service: Cardiovascular;  Laterality: N/A;   TRANSCATHETER AORTIC VALVE REPLACEMENT, TRANSFEMORAL  08/18/2018   TRANSCATHETER AORTIC VALVE REPLACEMENT, TRANSFEMORAL N/A 08/18/2018   Procedure: TRANSCATHETER AORTIC VALVE REPLACEMENT, TRANSFEMORAL using an Edwards 29mm Aortic Valve;  Surgeon: Kathleene Hazel, MD;  Location: [facility] OR;  Service: Open Heart Surgery;  Laterality: N/A;    Social History:   reports that he has never smoked. He has never used smokeless  tobacco. He reports that he does not currently use alcohol. He reports that he does not use drugs.  Family History:  His family history includes Heart failure in his mother; Pneumonia in his father.   Allergies: Allergies  Allergen Reactions   Crestor [Rosuvastatin] Other (See Comments)    Hurts muscles   Lipitor [Atorvastatin] Other (See Comments)    Hurts stomach    Home Medications: Prior to Admission medications   Medication Sig Start Date End Date Taking? Authorizing Provider  amiodarone (PACERONE) 200 MG tablet TAKE 1 TABLET(200 MG) BY MOUTH DAILY 08/13/22   Croitoru, Mihai, MD  aspirin EC 81 MG tablet Take 81 mg by mouth every Monday, Wednesday, and Friday.    [provider]  cefadroxil (DURICEF) 500 MG capsule Take 1 capsule (500 mg total) by mouth 2 (two) times daily. To start if evidence of a urinary tract infection 02/03/23   Daiva Eves, Lisette Grinder, MD  cholecalciferol (VITAMIN D) 1000 units tablet Take 2,000 Units by mouth at bedtime.    [provider]  Cranberry-Vit C-Lactobacillus (RA CRANBERRY SUPPLEMENTS PO) Take by mouth in the morning and at bedtime.     [provider]  fexofenadine (ALLEGRA) 60 MG tablet Take 60 mg by mouth daily.    [provider]  Multiple Vitamins-Minerals (PRESERVISION AREDS 2 PO) Take by mouth. Take one tablet by mouth dailey     [provider]  nitrofurantoin, macrocrystal-monohydrate, (MACROBID) 100 MG capsule Take 1 capsule (100 mg total) by mouth 2 (two) times daily. Patient not taking: Reported on 07/11/2022 11/29/21   Daiva Eves, Lisette Grinder, MD  polyethylene glycol (MIRALAX / GLYCOLAX) 17 g packet Take 17 g by mouth as needed.    [provider]  potassium chloride (KLOR-CON) 10 MEQ tablet Take 1 tablet (10 mEq total) by mouth daily. 12/16/22   Croitoru, Mihai, MD  Probiotic Product (PROBIOTIC DAILY PO) Take 1 capsule by mouth daily.    [provider]  Sodium Chloride Flush (NORMAL  SALINE FLUSH) 0.9 % SOLN Use 10 mLs by Intracatheter route as needed. 03/14/22 06/19/23  Shon Hough, NP  ursodiol (ACTIGALL) 300 MG capsule Take by mouth 2 (two) times daily. 01/27/23   [provider]  zinc gluconate 50 MG tablet Take 50 mg by mouth daily.    [provider]    Critical care time:   The patient is critically ill with multiple organ system failure and requires high complexity decision making for assessment and support, frequent evaluation and titration of therapies, advanced monitoring, review of radiographic studies and interpretation of complex data.   Critical Care Time devoted to patient care services, exclusive of separately billable procedures, described in this note is 41 minutes.  Tim Lair, PA-C [facility] 04/07/2023 12:12 AM  Please see [email or web address] for pager details.  From 7A-7P if no response, please call [phone] After hours, please call ELink [phone]

## 2023-03-21 NOTE — ED Triage Notes (Signed)
Pt BIB EMS from home, called out for vomiting pt was found with vomit dripping from mouth and unresponsive. Audible rales. 80% RA on EMS arrival. Pt arrives with assisting ventilations via BVM.

## 2023-03-21 NOTE — ED Notes (Signed)
Trauma Event Note  Assisted Primary RN with rapid sequence intubation. Assisted establishing PIV. Assisted with intubation medications and post intubation medications.    Last imported Vital Signs BP (!) 108/91   Pulse (!) 121   Temp 98.3 F (36.8 C) (Axillary)   Resp 16   Ht 5\' 10"  (1.778 m)   SpO2 100%   BMI 28.03 kg/m   Trending CBC Recent Labs    04/11/2023 2225 03/25/2023 2253  WBC 21.0*  --   HGB 16.0 15.6  15.6  HCT 46.8 46.0  46.0  PLT 197  --     Trending Coag's No results for input(s): "APTT", "INR" in the last 72 hours.  Trending BMET Recent Labs    03/20/2023 2253  NA 136  135  K 4.6  4.6  CL 104  BUN 34*  CREATININE 1.60*  GLUCOSE 276*      Jacquelene Kopecky  Trauma Response RN  Please call TRN at [phone] for further assistance.

## 2023-03-21 NOTE — Progress Notes (Signed)
Pharmacy Antibiotic Note  BEARETT PORCARO is a 89 y.o. male admitted on 04/01/2023 with  aspiration pneumonia .  Pharmacy has been consulted for Unasyn dosing. Estimated CrCl 101mL/min using historical and estimated weight.   Plan: Unasyn 3g Q6H.  Follow culture data for de-escalation.  Monitor renal function for dose adjustments as indicated.    Height: 5\' 10"  (177.8 cm) IBW/kg (Calculated) : 73  Temp (24hrs), Avg:98.3 F (36.8 C), Min:98.3 F (36.8 C), Max:98.3 F (36.8 C)  Recent Labs  Lab 03/23/2023 2225 03/18/2023 2253  WBC 21.0*  --   CREATININE  --  1.60*    CrCl cannot be calculated (Unknown ideal weight.).    Allergies  Allergen Reactions   Crestor [Rosuvastatin] Other (See Comments)    Hurts muscles   Lipitor [Atorvastatin] Other (See Comments)    Hurts stomach    Thank you for allowing pharmacy to be a part of this patient's care.  Estill Batten, PharmD, BCCCP  03/17/2023 11:29 PM

## 2023-03-21 NOTE — ED Provider Notes (Signed)
[place] EMERGENCY DEPARTMENT AT [facility] Provider Note   CSN: [id number] Arrival date & time: 04/04/23  2212     History  Chief Complaint  Patient presents with   Unresponsive    William [name] is a 89 y.o. male history of cholangitis status post biliary drain, heart failure, dementia, ICD revision removal, V. tach here presenting with vomiting and aspiration and respiratory distress.  Patient has 24-hour home care.  Patient has poor functional status at home.  Patient has nonstop vomiting since yesterday.  The aide was unable to control the vomiting so the wife called EMS.  When EMS got there, patient is very somnolent and was in respiratory distress.  He was put on nonrebreather but was still in distress so patient was bagged by EMS.  Patient has a MOST form and states that he is DNR but not DNI.  HPI     Home Medications Prior to Admission medications   Medication Sig Start Date End Date Taking? Authorizing Provider  amiodarone (PACERONE) 200 MG tablet TAKE 1 TABLET(200 MG) BY MOUTH DAILY 08/13/22   Croitoru, Mihai, MD  aspirin EC 81 MG tablet Take 81 mg by mouth every Monday, Wednesday, and Friday.    [provider]  cefadroxil (DURICEF) 500 MG capsule Take 1 capsule (500 mg total) by mouth 2 (two) times daily. To start if evidence of a urinary tract infection 02/03/23   Daiva Eves, Lisette Grinder, MD  cholecalciferol (VITAMIN D) 1000 units tablet Take 2,000 Units by mouth at bedtime.    [provider]  Cranberry-Vit C-Lactobacillus (RA CRANBERRY SUPPLEMENTS PO) Take by mouth in the morning and at bedtime.     [provider]  fexofenadine (ALLEGRA) 60 MG tablet Take 60 mg by mouth daily.    [provider]  Multiple Vitamins-Minerals (PRESERVISION AREDS 2 PO) Take by mouth. Take one tablet by mouth dailey     [provider]  nitrofurantoin, macrocrystal-monohydrate, (MACROBID) 100 MG capsule Take 1 capsule (100 mg total)  by mouth 2 (two) times daily. Patient not taking: Reported on 07/11/2022 11/29/21   Daiva Eves, Lisette Grinder, MD  polyethylene glycol (MIRALAX / GLYCOLAX) 17 g packet Take 17 g by mouth as needed.    [provider]  potassium chloride (KLOR-CON) 10 MEQ tablet Take 1 tablet (10 mEq total) by mouth daily. 12/16/22   Croitoru, Mihai, MD  Probiotic Product (PROBIOTIC DAILY PO) Take 1 capsule by mouth daily.    [provider]  Sodium Chloride Flush (NORMAL SALINE FLUSH) 0.9 % SOLN Use 10 mLs by Intracatheter route as needed. 03/14/22 06/19/23  Shon Hough, NP  ursodiol (ACTIGALL) 300 MG capsule Take by mouth 2 (two) times daily. 01/27/23   [provider]  zinc gluconate 50 MG tablet Take 50 mg by mouth daily.    [provider]      Allergies    Crestor [rosuvastatin] and Lipitor [atorvastatin]    Review of Systems   Review of Systems  Gastrointestinal:  Positive for vomiting.  Neurological:  Positive for weakness.  Psychiatric/Behavioral:  Positive for confusion.   All other systems reviewed and are negative.   Physical Exam Updated Vital Signs BP 110/89   Pulse (!) 148   Temp 98.3 F (36.8 C) (Axillary)   Resp 14   Ht 5\' 10"  (1.778 m)   SpO2 100%   BMI 28.03 kg/m  Physical Exam Vitals and nursing note reviewed.  Constitutional:  Comments: Altered and confused and somnolent and not protecting his airway.  He is also actively vomiting  HENT:     Head: Normocephalic.     Nose: Nose normal.     Mouth/Throat:     Mouth: Mucous membranes are dry.  Eyes:     Pupils: Pupils are equal, round, and reactive to light.  Cardiovascular:     Rate and Rhythm: Regular rhythm. Tachycardia present.  Pulmonary:     Comments: Crackles bilateral bases Abdominal:     General: Abdomen is flat.     Palpations: Abdomen is soft.     Comments: Biliary drain in place   Musculoskeletal:        General: Normal range of motion.     Cervical back: Normal range of  motion and neck supple.  Skin:    General: Skin is warm.  Neurological:     Comments: Somnolent and moving all extremities  Psychiatric:     Comments: Unable      ED Results / Procedures / Treatments   Labs (all labs ordered are listed, but only abnormal results are displayed) Labs Reviewed  I-STAT CHEM 8, ED - Abnormal; Notable for the following components:      Result Value   BUN 34 (*)    Creatinine, Ser 1.60 (*)    Glucose, Bld 276 (*)    Calcium, Ion 1.05 (*)    TCO2 20 (*)    All other components within normal limits  I-STAT VENOUS BLOOD GAS, ED - Abnormal; Notable for the following components:   pCO2, Ven 31.1 (*)    pO2, Ven 104 (*)    Bicarbonate 18.6 (*)    TCO2 20 (*)    Acid-base deficit 5.0 (*)    Calcium, Ion 1.04 (*)    All other components within normal limits  CBC WITH DIFFERENTIAL/PLATELET  COMPREHENSIVE METABOLIC PANEL  TROPONIN I (HIGH SENSITIVITY)    EKG None  Radiology No results found.  Procedures Procedures    Angiocath insertion Performed by: Richardean Canal  Consent: Verbal consent obtained. Risks and benefits: risks, benefits and alternatives were discussed Time out: Immediately prior to procedure a "time out" was called to verify the correct patient, procedure, equipment, support staff and site/side marked as required.  Preparation: Patient was prepped and draped in the usual sterile fashion.  Vein Location: L forearm  Ultrasound Guided  Gauge: 20 long   Normal blood return and flush without difficulty Patient tolerance: Patient tolerated the procedure well with no immediate complications.  INTUBATION Performed by: Richardean Canal  Required items: required blood products, implants, devices, and special equipment available Patient identity confirmed: provided demographic data and hospital-assigned identification number Time out: Immediately prior to procedure a "time out" was called to verify the correct patient, procedure,  equipment, support staff and site/side marked as required.  Indications: respiratory distress  Intubation method: Glidescope Laryngoscopy   Preoxygenation: BVM  Sedatives: 20 mg Etomidate Paralytic: 100 mg rocuronium  Tube Size: 7.5 cuffed  Post-procedure assessment: chest rise and ETCO2 monitor Breath sounds: equal and absent over the epigastrium Tube secured with: ETT holder Chest x-ray interpreted by radiologist and me.  Chest x-ray findings: endotracheal tube in appropriate position  Patient tolerated the procedure well with no immediate complications.  CRITICAL CARE Performed by: Richardean Canal   Total critical care time: 32 minutes  Critical care time was exclusive of separately billable procedures and treating other patients.  Critical care was necessary to  treat or prevent imminent or life-threatening deterioration.  Critical care was time spent personally by me on the following activities: development of treatment plan with patient and/or surrogate as well as nursing, discussions with consultants, evaluation of patient's response to treatment, examination of patient, obtaining history from patient or surrogate, ordering and performing treatments and interventions, ordering and review of laboratory studies, ordering and review of radiographic studies, pulse oximetry and re-evaluation of patient's condition.     Medications Ordered in ED Medications  etomidate (AMIDATE) injection (20 mg Intravenous Given 04/11/2023 2223)  rocuronium [facility]) injection (100 mg Intravenous Given 04/07/2023 2224)  phenylephrine (NEO-SYNEPHRINE) injection (0.2 mg Intravenous Given 03/31/2023 2230)  fentaNYL 10 mcg/ml infusion (has no administration in time range)  midazolam-sodium chloride 100-0.9 MG/100ML-% infusion (has no administration in time range)  norepinephrine (LEVOPHED) 4mg  in (0.016 mg/mL) premix infusion (40 mcg/min Intravenous Rate/Dose Change 03/15/2023 2248)  midazolam (VERSED) 100  mg/100 mL (1 mg/mL) premix infusion (has no administration in time range)  fentaNYL in NS (27mcg/ml) infusion-PREMIX (has no administration in time range)  fentaNYL (SUBLIMAZE) bolus via infusion 25-100 mcg (has no administration in time range)  vasopressin (PITRESSIN) 20 Units in sodium chloride 0.9 % 100 mL infusion-*FOR SHOCK* (0.03 Units/min Intravenous New Bag/Given 04/03/2023 2240)  amiodarone (NEXTERONE) 1.8 mg/mL load via infusion 150 mg (has no administration in time range)    Followed by  amiodarone (NEXTERONE PREMIX) 360-4.14 MG/200ML-% (1.8 mg/mL) IV infusion (has no administration in time range)    Followed by  amiodarone (NEXTERONE PREMIX) 360-4.14 MG/200ML-% (1.8 mg/mL) IV infusion (has no administration in time range)  ondansetron (ZOFRAN) injection 4 mg (4 mg Intravenous Not Given 03/19/2023 2242)    ED Course/ Medical Decision Making/ A&P                             Medical Decision Making Shelby Anderle Kissling is a 89 y.o. male here presenting with respiratory distress.  Patient is somnolent and actively aspirating.  I am concerned that he has aspiration pneumonia.  Patient also appears to be in rapid V. tach.  Patient is on amiodarone and has a history of V. tach.  Since patient has no DNI, I felt that the patient is not protecting his airway and will need to be emergently intubated.  11:36 PM I reviewed patient's labs and white blood cell count is 21,000.  Patient also has an AKI and anion gap of 21.  Chest x-ray and abdominal x-ray showed that the ET tube is in place and NG tube is also in place.  I ordered CT head given altered mental status to rule out bleed.  Also order CT abdomen pelvis to rule out intra-abdominal processes.  Patient became hypotensive after intubation.  Patient was started on vasopressin and also Levophed.  Critical care to admit   Problems Addressed: Nausea and vomiting, unspecified vomiting type: acute illness or injury Respiratory distress:  acute illness or injury  Amount and/or Complexity of Data Reviewed Labs: ordered. Decision-making details documented in ED Course. Radiology: ordered and independent interpretation performed. Decision-making details documented in ED Course.  Risk Prescription drug management. Decision regarding hospitalization.    Final Clinical Impression(s) / ED Diagnoses Final diagnoses:  None    Rx / DC Orders ED Discharge Orders     None         Charlynne Pander, MD 04/01/2023 2338

## 2023-03-22 ENCOUNTER — Other Ambulatory Visit (HOSPITAL_COMMUNITY): Payer: Medicare Other

## 2023-03-22 DIAGNOSIS — K559 Vascular disorder of intestine, unspecified: Secondary | ICD-10-CM

## 2023-03-22 DIAGNOSIS — N179 Acute kidney failure, unspecified: Secondary | ICD-10-CM

## 2023-03-22 DIAGNOSIS — A419 Sepsis, unspecified organism: Secondary | ICD-10-CM | POA: Diagnosis not present

## 2023-03-22 DIAGNOSIS — R6521 Severe sepsis with septic shock: Secondary | ICD-10-CM | POA: Diagnosis not present

## 2023-03-22 LAB — COMPREHENSIVE METABOLIC PANEL
ALT: 11 U/L (ref 0–44)
AST: 26 U/L (ref 15–41)
Albumin: 2 g/dL — ABNORMAL LOW (ref 3.5–5.0)
Alkaline Phosphatase: 93 U/L (ref 38–126)
Anion gap: 15 (ref 5–15)
BUN: 30 mg/dL — ABNORMAL HIGH (ref 8–23)
CO2: 19 mmol/L — ABNORMAL LOW (ref 22–32)
Calcium: 7.9 mg/dL — ABNORMAL LOW (ref 8.9–10.3)
Chloride: 101 mmol/L (ref 98–111)
Creatinine, Ser: 1.82 mg/dL — ABNORMAL HIGH (ref 0.61–1.24)
GFR, Estimated: 34 mL/min — ABNORMAL LOW (ref >60)
Glucose, Bld: 327 mg/dL — ABNORMAL HIGH (ref 70–99)
Potassium: 3.9 mmol/L (ref 3.5–5.1)
Sodium: 135 mmol/L (ref 135–145)
Total Bilirubin: 1.6 mg/dL — ABNORMAL HIGH (ref 0.3–1.2)
Total Protein: 5.3 g/dL — ABNORMAL LOW (ref 6.5–8.1)

## 2023-03-22 LAB — CBC WITH DIFFERENTIAL/PLATELET
Abs Immature Granulocytes: 0.06 10*3/uL (ref 0.00–0.07)
Basophils Absolute: 0.1 10*3/uL (ref 0.0–0.1)
Basophils Relative: 0 %
Eosinophils Absolute: 0 10*3/uL (ref 0.0–0.5)
Eosinophils Relative: 0 %
HCT: 37.6 % — ABNORMAL LOW (ref 39.0–52.0)
Hemoglobin: 12.7 g/dL — ABNORMAL LOW (ref 13.0–17.0)
Immature Granulocytes: 0 %
Lymphocytes Relative: 3 %
Lymphs Abs: 0.5 10*3/uL — ABNORMAL LOW (ref 0.7–4.0)
MCH: 34.3 pg — ABNORMAL HIGH (ref 26.0–34.0)
MCHC: 33.8 g/dL (ref 30.0–36.0)
MCV: 101.6 fL — ABNORMAL HIGH (ref 80.0–100.0)
Monocytes Absolute: 0.7 10*3/uL (ref 0.1–1.0)
Monocytes Relative: 4 %
Neutro Abs: 14.7 10*3/uL — ABNORMAL HIGH (ref 1.7–7.7)
Neutrophils Relative %: 93 %
Platelets: 166 10*3/uL (ref 150–400)
RBC: 3.7 MIL/uL — ABNORMAL LOW (ref 4.22–5.81)
RDW: 16.4 % — ABNORMAL HIGH (ref 11.5–15.5)
WBC: 16 10*3/uL — ABNORMAL HIGH (ref 4.0–10.5)
nRBC: 0.1 % (ref 0.0–0.2)

## 2023-03-22 LAB — LACTIC ACID, PLASMA
Lactic Acid, Venous: 6.4 mmol/L (ref 0.5–1.9)
Lactic Acid, Venous: 6.5 mmol/L (ref 0.5–1.9)
Lactic Acid, Venous: 6.5 mmol/L (ref 0.5–1.9)

## 2023-03-22 LAB — BASIC METABOLIC PANEL
Anion gap: 16 — ABNORMAL HIGH (ref 5–15)
BUN: 33 mg/dL — ABNORMAL HIGH (ref 8–23)
CO2: 18 mmol/L — ABNORMAL LOW (ref 22–32)
Calcium: 7 mg/dL — ABNORMAL LOW (ref 8.9–10.3)
Chloride: 97 mmol/L — ABNORMAL LOW (ref 98–111)
Creatinine, Ser: 1.71 mg/dL — ABNORMAL HIGH (ref 0.61–1.24)
GFR, Estimated: 37 mL/min — ABNORMAL LOW (ref >60)
Glucose, Bld: 327 mg/dL — ABNORMAL HIGH (ref 70–99)
Potassium: 3.7 mmol/L (ref 3.5–5.1)
Sodium: 131 mmol/L — ABNORMAL LOW (ref 135–145)

## 2023-03-22 LAB — MRSA NEXT GEN BY PCR, NASAL: MRSA by PCR Next Gen: NOT DETECTED

## 2023-03-22 LAB — POCT I-STAT 7, (LYTES, BLD GAS, ICA,H+H)
Acid-base deficit: 9 mmol/L — ABNORMAL HIGH (ref 0.0–2.0)
Acid-base deficit: 11 mmol/L — ABNORMAL HIGH (ref 0.0–2.0)
Bicarbonate: 21 mmol/L (ref 20.0–28.0)
Bicarbonate: 17.3 mmol/L — ABNORMAL LOW (ref 20.0–28.0)
Calcium, Ion: 1.15 mmol/L (ref 1.15–1.40)
Calcium, Ion: 1.12 mmol/L — ABNORMAL LOW (ref 1.15–1.40)
HCT: 36 % — ABNORMAL LOW (ref 39.0–52.0)
HCT: 38 % — ABNORMAL LOW (ref 39.0–52.0)
Hemoglobin: 12.2 g/dL — ABNORMAL LOW (ref 13.0–17.0)
Hemoglobin: 12.9 g/dL — ABNORMAL LOW (ref 13.0–17.0)
O2 Saturation: 100 %
O2 Saturation: 93 %
Patient temperature: 98.3
Patient temperature: 100.1
Potassium: 4 mmol/L (ref 3.5–5.1)
Potassium: 3.8 mmol/L (ref 3.5–5.1)
Sodium: 136 mmol/L (ref 135–145)
Sodium: 134 mmol/L — ABNORMAL LOW (ref 135–145)
TCO2: 23 mmol/L (ref 22–32)
TCO2: 19 mmol/L — ABNORMAL LOW (ref 22–32)
pCO2 arterial: 65.9 mmHg (ref 32–48)
pCO2 arterial: 47.8 mmHg (ref 32–48)
pH, Arterial: 7.11 — CL (ref 7.35–7.45)
pH, Arterial: 7.17 — CL (ref 7.35–7.45)
pO2, Arterial: 326 mmHg — ABNORMAL HIGH (ref 83–108)
pO2, Arterial: 87 mmHg (ref 83–108)

## 2023-03-22 LAB — HEMOGLOBIN A1C
Hgb A1c MFr Bld: 4.4 % — ABNORMAL LOW (ref 4.8–5.6)
Mean Plasma Glucose: 79.58 mg/dL

## 2023-03-22 LAB — TROPONIN I (HIGH SENSITIVITY): Troponin I (High Sensitivity): 40 ng/L — ABNORMAL HIGH (ref <18)

## 2023-03-22 LAB — GLUCOSE, CAPILLARY
Glucose-Capillary: 291 mg/dL — ABNORMAL HIGH (ref 70–99)
Glucose-Capillary: 280 mg/dL — ABNORMAL HIGH (ref 70–99)
Glucose-Capillary: 300 mg/dL — ABNORMAL HIGH (ref 70–99)

## 2023-03-22 MED ORDER — ACETAMINOPHEN 650 MG RE SUPP: 650.0000 mg | Freq: Four times a day (QID) | RECTAL | Status: DC | PRN

## 2023-03-22 MED ORDER — SODIUM CHLORIDE 0.9 % IV SOLN
2.0000 g | Freq: Once | INTRAVENOUS | Status: AC
  Administered 2023-03-22: 2 g via INTRAVENOUS
  Filled 2023-03-22: qty 40

## 2023-03-22 MED ORDER — VANCOMYCIN HCL 1500 MG/300ML IV SOLN
1500.0000 mg | Freq: Once | INTRAVENOUS | Status: AC
  Administered 2023-03-22: 1500 mg via INTRAVENOUS
  Filled 2023-03-22: qty 300

## 2023-03-22 MED ORDER — VANCOMYCIN VARIABLE DOSE PER UNSTABLE RENAL FUNCTION (PHARMACIST DOSING): Status: DC

## 2023-03-22 MED ORDER — SODIUM BICARBONATE 8.4 % IV SOLN
INTRAVENOUS | Status: DC
  Filled 2023-03-22 (×2): qty 150

## 2023-03-22 MED ORDER — FAMOTIDINE 20 MG PO TABS: 10.0000 mg | ORAL_TABLET | Freq: Every day | ORAL | Status: DC

## 2023-03-22 MED ORDER — GLYCOPYRROLATE 0.2 MG/ML IJ SOLN: 0.2000 mg | INTRAMUSCULAR | Status: DC | PRN

## 2023-03-22 MED ORDER — POLYETHYLENE GLYCOL 3350 17 G PO PACK: 17.0000 g | PACK | Freq: Every day | ORAL | Status: DC | PRN

## 2023-03-22 MED ORDER — ACETAMINOPHEN 325 MG PO TABS: 650.0000 mg | ORAL_TABLET | Freq: Four times a day (QID) | ORAL | Status: DC | PRN

## 2023-03-22 MED ORDER — SODIUM CHLORIDE 0.9% FLUSH: 10.0000 mL | INTRAVENOUS | Status: DC | PRN

## 2023-03-22 MED ORDER — CHLORHEXIDINE GLUCONATE CLOTH 2 % EX PADS
6.0000 | MEDICATED_PAD | Freq: Every day | CUTANEOUS | Status: DC
  Administered 2023-03-22: 6 via TOPICAL

## 2023-03-22 MED ORDER — NOREPINEPHRINE 16 MG/250ML-% IV SOLN
0.0000 ug/min | INTRAVENOUS | Status: DC
  Administered 2023-03-22: 40 ug/min via INTRAVENOUS
  Filled 2023-03-22: qty 250

## 2023-03-22 MED ORDER — SODIUM CHLORIDE 0.9 % IV SOLN: 1.0000 g | Freq: Two times a day (BID) | INTRAVENOUS | Status: DC

## 2023-03-22 MED ORDER — SODIUM CHLORIDE 0.9 % IV SOLN: INTRAVENOUS | Status: DC

## 2023-03-22 MED ORDER — GLYCOPYRROLATE 1 MG PO TABS: 1.0000 mg | ORAL_TABLET | ORAL | Status: DC | PRN

## 2023-03-22 MED ORDER — HYDROCORTISONE SOD SUC (PF) 100 MG IJ SOLR
100.0000 mg | Freq: Two times a day (BID) | INTRAMUSCULAR | Status: DC
  Administered 2023-03-22: 100 mg via INTRAVENOUS
  Filled 2023-03-22: qty 2

## 2023-03-22 MED ORDER — MIDAZOLAM HCL 2 MG/2ML IJ SOLN: 2.0000 mg | INTRAMUSCULAR | Status: DC | PRN

## 2023-03-22 MED ORDER — SODIUM CHLORIDE 0.9% FLUSH: 10.0000 mL | Freq: Two times a day (BID) | INTRAVENOUS | Status: DC

## 2023-03-22 MED ORDER — POLYVINYL ALCOHOL 1.4 % OP SOLN: 1.0000 [drp] | Freq: Four times a day (QID) | OPHTHALMIC | Status: DC | PRN

## 2023-03-22 MED ORDER — SODIUM BICARBONATE 8.4 % IV SOLN
50.0000 meq | Freq: Once | INTRAVENOUS | Status: AC
  Administered 2023-03-22: 50 meq via INTRAVENOUS
  Filled 2023-03-22: qty 50

## 2023-03-22 MED ORDER — NOREPINEPHRINE 4 MG/250ML-% IV SOLN
0.0000 ug/min | INTRAVENOUS | Status: DC
  Administered 2023-03-22: 38 ug/min via INTRAVENOUS
  Administered 2023-03-22: 26 ug/min via INTRAVENOUS
  Administered 2023-03-22 (×3): 40 ug/min via INTRAVENOUS
  Administered 2023-03-22: 25 ug/min via INTRAVENOUS
  Filled 2023-03-22: qty 250
  Filled 2023-03-22: qty 500
  Filled 2023-03-22: qty 250

## 2023-03-23 LAB — CULTURE, BLOOD (ROUTINE X 2): Special Requests: ADEQUATE

## 2023-03-24 LAB — GLUCOSE, CAPILLARY: Glucose-Capillary: 315 mg/dL — ABNORMAL HIGH (ref 70–99)

## 2023-03-24 LAB — CULTURE, BLOOD (ROUTINE X 2): Culture: NO GROWTH

## 2023-03-25 LAB — CULTURE, BLOOD (ROUTINE X 2)

## 2023-03-27 LAB — CULTURE, BLOOD (ROUTINE X 2)
Culture: NO GROWTH
Special Requests: ADEQUATE

## 2023-04-14 ENCOUNTER — Other Ambulatory Visit (HOSPITAL_COMMUNITY): Payer: Medicare Other

## 2023-04-14 NOTE — Progress Notes (Signed)
Pharmacy Antibiotic Note  William [name] is a 89 y.o. male admitted on 04/12/2023 with unresponsiveness, found down . Patient has complex ID history with recurrent episodes of cholangitis and history of ESBL followed by Dr. Daiva Eves. Labs were notable for WBC 21, BUN 36/Cr 1.85 (baseline 0.8)  Pharmacy has been consulted for vancomycin and meropenem dosing.   Plan: Vancomycin 1500mg  IV x1 then redose based on levels Meropenem 2G IV x1 then 1G IV q12 hours  Height: 5\' 10"  (177.8 cm) Weight: 80.1 kg (176 lb 9.4 oz) IBW/kg (Calculated) : 73  Temp (24hrs), Avg:98.3 F (36.8 C), Min:98.3 F (36.8 C), Max:98.3 F (36.8 C)  Recent Labs  Lab 04/03/2023 2225 03/27/2023 2253  0137  WBC 21.0*  --  16.0*  CREATININE 1.85* 1.60* 1.82*  LATICACIDVEN  --   --  6.4*    Estimated Creatinine Clearance: 26.2 mL/min (A) (by C-G formula based on SCr of 1.82 mg/dL (H)).    Allergies  Allergen Reactions   Crestor [Rosuvastatin] Other (See Comments)    Hurts muscles   Lipitor [Atorvastatin] Other (See Comments)    Hurts stomach    Thank you for allowing pharmacy to be a part of this patient's care.  Toniann Fail Toddrick Sanna  2:58 AM

## 2023-04-14 NOTE — Progress Notes (Signed)
At 1647 patient was absent of heart sounds, respirations and pupillary response.

## 2023-04-14 NOTE — Progress Notes (Signed)
Pt extubated to comfort care measures per family request.

## 2023-04-14 NOTE — Progress Notes (Addendum)
eLink Physician-Brief Progress Note Patient Name: BRICE KOSSMAN DOB: [birth year] MRN: [id number]   Date of Service    HPI/Events of Note  89 year old male with history of metabolic syndrome, heart failure with reduced ejection fraction, mitral valve stenosis, and aortic stenosis status post TAVR who was found down unresponsive.  Patient was intubated and sedated, but wife states that he had vomiting around 2 AM and throughout the day today.  On presentation, the patient is tachycardic, normotensive, and saturating 100% on bag mask ventilation.  Venous gas shows appropriate ventilation and oxygenation.  Metabolic panel shows elevated creatinine, hyperglycemia, hypocalcemia.  CBC with leukocytosis.  CT abdomen/pelvis shows dense consolidation left greater than right lung base.  Enteric tube in the body of the stomach.  Moderate to large stool burden.  Evidence of a AAA, likely chronic.  eICU Interventions  Broad-spectrum antibiotics in place  No obvious colonic obstruction.  Could consider adding bowel regimen.  Maintain norepinephrine and vasopressin to maintain MAP greater than 65.  GI prophylaxis with H2 blocker, no indication for concurrent PPI.  DVT prophylaxis with heparin subcutaneous   0251 - LA 6.4 after receiving 3 L crystalloid already.  Fourth liter going in now at 250 cc/h.  Will add recheck at noon.  Given improvement in pressor requirements, hold on stress dose steroids. 0451 - 50%, 5 peep, 22 RR, and 580 Vt   Primarily metabolic acidosis.  We will increase respiratory rate from 22 to 28.  Repeat BMP, lactate, and ABG in 6 hours   0527 -in the past 45 minutes, the patient has had escalating pressor requirements.  Jump of norepinephrine 10 mcg over that period.  Suspect the patient is becoming more acidotic.  Will initiate a bicarb infusion after 1 ampoule of bicarb push.  Intervention Category Evaluation Type: New Patient Evaluation  Kareemah Grounds , 1:01 AM

## 2023-04-14 NOTE — Progress Notes (Signed)
    1533  Spiritual Encounters  Type of Visit Initial  Care provided to: Patient;Pt and family;Friend  Conversation partners present during encounter Nurse  Referral source Nurse (RN/NT/LPN)  Reason for visit End-of-life  OnCall Visit Yes    Chaplain responded to visit a patient who is actively dying - family is switching to comfort care measures. Chaplain met with patient's wife and daughter and reviewed faith, family, support systems, etc. Patient's pastor from [place] is also coming in soon.   Chaplain offered prayer and support. Chaplain introduced spiritual care services. Spiritual care services available as needed.   Alda Ponder, Chaplain

## 2023-04-14 NOTE — Procedures (Signed)
Central Venous Catheter Insertion Procedure Note  William [name]  [id number]  1928-08-08  Date:  Time:1:17 AM   Provider Performing:Avarose Mervine   Procedure: Insertion of Non-tunneled Central Venous [phone]) with US guidance ([id number])   Indication(s) Difficult access  Consent Risks of the procedure as well as the alternatives and risks of each were explained to the patient and/or caregiver.  Consent for the procedure was obtained and is signed in the bedside chart  Anesthesia Topical only with 1% lidocaine   Timeout Verified patient identification, verified procedure, site/side was marked, verified correct patient position, special equipment/implants available, medications/allergies/relevant history reviewed, required imaging and test results available.  Sterile Technique Maximal sterile technique including full sterile barrier drape, hand hygiene, sterile gown, sterile gloves, mask, hair covering, sterile ultrasound probe cover (if used).  Procedure Description Area of catheter insertion was cleaned with chlorhexidine and draped in sterile fashion.  With real-time ultrasound guidance a central venous catheter was placed into the right femoral vein. Nonpulsatile blood flow and easy flushing noted in all ports.  The catheter was sutured in place and sterile dressing applied. NB: the vein was collapsed consistent with decreased intravascular volume.     Complications/Tolerance None; patient tolerated the procedure well. Chest X-ray is ordered to verify placement for internal jugular or subclavian cannulation.   Chest x-ray is not ordered for femoral cannulation.  EBL Minimal  Specimen(s) None   Lynnell Catalan, MD [facility] ICU Physician [facility]  Pager: [phone] Or Epic Secure Chat After hours: [phone].  , 1:19 AM

## 2023-04-14 NOTE — Discharge Summary (Signed)
DEATH SUMMARY   Patient Details  Name: William [name] MRN: [id number] DOB: 1928/03/06 [id number], Adelfa Koh, MD  Admission/Discharge Information   Admit Date:  04/17/23  Date of Death: Date of Death: 04-18-2023  Time of Death: Time of Death: 05-04-46  Length of Stay: 1   Principle Cause of death: Septic shock Small bowel obstruction due to stangulated internal hernia / Ischemic bowel  Hospital Diagnoses: Principal Problem:   Septic shock (HCC)  History of Present Illness:  89 year old man who presented to [facility] ED 04/17/23 via EMS for unresponsiveness, found down. PMHx significant for HLD, HFrEF (Echo 11/2018 with EF 25-30%, diffuse LV hypokinesis, degenerative MV), AS (s/p TAVR), NICM with NSVT/SVT (s/p ICD placement, removed due to endocarditis), OSA, recurrent cholangitis/biliary sepsis with chronic biliary drain (pre[facility]), prostate CA, dementia.   Patient is intubated and sedated, therefore history is obtained from patient's wife at bedside.  Patient's wife states that patient began vomiting around 0200 day of admission; continued to vomit every few hours.  EMS was called after patient was later found somnolent with vomit coming from his mouth.  SpO2 80% on RA with audible rales on EMS arrival requiring NRB then bagging.    On ED arrival, patient was afebrile, HR 148, BP 110/89, RR 14, SpO2 100%.  Intubated for airway protection in ED. CXR showed chronic left lung volume loss and pleuroparenchymal opacity with right infrahilar atelectasis.  Abdominal x-ray showed prominent air-filled small bowel, concerning for enteritis/ileus.  CT Head and CT A/P pending Labs were notable for WBC 21, Hgb 16, Plt 197; Na 136, K4.9, CO2 15, BUN 36/Cr 1.85 (baseline 0.8).  Transaminases/alk phos WNL, T. bili mildly elevated at 2.2.  Troponin 29.  VBG with pH 7.38/pCO2 31/bicarb 18.6.  Blood cultures pending.  Broad-spectrum meropenem/vancomycin initiated.  Levophed and vasopressin started.    PCCM consulted for ICU admission.     Assessment and Plan: Septic shock, most likely due to ischemic bowel due to small bowel obstruction/internal hernia versus adhesion  -Less likely biliary sepsis, possible component of aspiration PNA - - Goal MAP > 65 - Fluid resuscitation as tolerated - Levophed titrated to goal MAP + vaso - Continue broad-spectrum antibiotics (meropenem/vanc)     Acute toxic encephalopathy, likely septic etiology ?L-sided deficits (LUE) per family present x 1 week Dementia Head CT negative - Correct metabolic derangements - Delirium precautions   Acute hypoxemic respiratory failure Concern for aspiration PNA - Continue full vent support (4-8cc/kg IBW) - Wean FiO2 for O2 sat > 90% - Daily WUA/SBT - VAP bundle - Pulmonary hygiene - PAD protocol for sedation: Fentanyl for goal RASS 0 to -1   HFrEF NICM History of SVT/NSVT, s/p ICD (since removed) AS s/p TAVR Echo 11/2018 with EF 25-30%, diffuse LV hypokinesis, degenerative MV. 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, s/p multiple cholelithiasis extractions via IR. -Discussed with surgery, not a candidate for laparotomy - Biliary drain remains in place - Tube management per protocol - Empiric antibiotic coverage as above   AKI, likely ATN in the setting of sepsis/hypotension Metabolic acidosis - Trend BMP - Replete electrolytes as indicated - Monitor I&Os - Avoid nephrotoxic agents as able - Ensure adequate renal perfusion   Chronically bedbound At risk for malnutrition Physical deconditioning, POA -  patient has reportedly been offered palliative care in the past but family desired continued aggressive care  Hospital Course: 6/7 - Presented to [facility] ED via EMS for respiratory distress, somnolence.   Intubated on ED arrival. Concern for septic shock, possibly related to chronic biliary tube/aspiration.  Started on broad-spectrum meropenem/Vanc. Admitted to ICU. 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 Not a candidate for surgery due to his age, medical issues, and severe sepsis.  Family opted for withdrawal of life support & comfort care.   Consultations: General surgery    Signed: Comer Locket. Vassie Loll, MD 03/24/23

## 2023-04-14 NOTE — Progress Notes (Signed)
NAME:  William [name], MRN:  [id number], DOB:  [birth year], LOS: 1 ADMISSION DATE:  April 10, 2023 CONSULTATION DATE:  Apr 10, 2023 REFERRING MD:  Silverio Lay - EDP CHIEF COMPLAINT:  Unresponsive, concern for septic shock  History of Present Illness:  89 year old man who presented to [facility] ED Apr 10, 2023 via EMS for unresponsiveness, found down. 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  Patient is intubated and sedated, therefore history is obtained from patient's wife at bedside.  Patient's wife states that patient began vomiting around 0200 day of admission; continued to vomit every few hours.  EMS was called after patient was later found somnolent with vomit coming from his mouth.  SpO2 80% on RA with audible rales on EMS arrival requiring NRB then bagging.   On ED arrival, patient was afebrile, HR 148, BP 110/89, RR 14, SpO2 100%.  Intubated for airway protection in ED. CXR showed chronic left lung volume loss and pleuroparenchymal opacity with right infrahilar atelectasis.  Abdominal x-ray showed prominent air-filled small bowel, concerning for enteritis/ileus.  CT Head and CT A/P pending Labs were notable for WBC 21, Hgb 16, Plt 197; Na 136, K4.9, CO2 15, BUN 36/Cr 1.85 (baseline 0.8).  Transaminases/alk phos WNL, T. bili mildly elevated at 2.2.  Troponin 29.  VBG with pH 7.38/pCO2 31/bicarb 18.6.  Blood cultures pending.  Broad-spectrum meropenem/vancomycin initiated.  Levophed and vasopressin started.  PCCM consulted for ICU admission.  Pertinent Medical History:   Past Medical History:  Diagnosis Date   AAA (abdominal aortic aneurysm) (HCC)    7/13 3.8cm   Arthritis    "joints" (01/11/2014)   Asthma    "seasonal; some foods"    Chronic systolic  CHF (congestive heart failure) (HCC)    COVID-19 12/15/2019   Dementia (HCC)    Goals of care, counseling/discussion 08/23/2021   HLD (hyperlipidemia)    HOH (hard of hearing)    Lumbar vertebral fracture (HCC)    L1- 04/11/2014    Parotitis 06/10/2019   Prostate cancer (HCC)    S/P ICD (internal cardiac defibrillator) procedure, 01/11/14 removal of ERI gen and placement of Medtronic Evera XT VR & NEW Right ventricular lead Medtronic 01/12/2014   S/P TAVR (transcatheter aortic valve replacement)    Edwards Sapien 3 THV (size 29 mm, model # K966601, serial # [id number])   Severe aortic stenosis    Sleep apnea    "lost 60# & don't have it anymore" (01/11/2014)   Somnolence 08/04/2019   UTI (urinary tract infection) 08/04/2019   Ventricular tachycardia (HCC)    Significant Hospital Events: Including procedures, antibiotic start and stop dates in addition to other pertinent events   April 10, 2023 - Presented to [facility] ED via EMS for respiratory distress, somnolence.  Intubated on ED arrival. Concern for septic shock, possibly related to chronic biliary tube/aspiration.  Started on broad-spectrum meropenem/Vanc. Admitted to ICU. 6/8 CT abdomen shows closed loop small bowel obstruction related to adhesion or internal hernia with extensive edema in the segment, large AAA 6.5 cm without active bleeding  Interim History / Subjective:   Critically ill In florid shock On 40 mics of Levophed and vasopressin  Objective:  Blood pressure (!) 84/72, pulse (!) 106, temperature (!) 97 F (36.1 C), temperature source Bladder, resp. rate (!) 28, height 5\' 10"  (1.778 m),  weight 80.1 kg, SpO2 92 %.    Vent Mode: PRVC FiO2 (%):  [50 %-100 %] 50 % Set Rate:  [16 bmp-28 bmp] 28 bmp Vt Set:  [500 mL-580 mL] 580 mL PEEP:  [5 cmH20] 5 cmH20 Plateau Pressure:  [17 cmH20-21 cmH20] 21 cmH20   Intake/Output Summary (Last 24 hours) at 04/06/2023 0910 Last data filed at 04/06/23 0700 Gross per 24 hour  Intake 4966.49 ml  Output  75 ml  Net 4891.49 ml   Filed Weights   06-Apr-2023 0000 04-06-2023 0223  Weight: 79.4 kg 80.1 kg    Physical Examination: General: Acute-on-chronically ill-appearing elderly man in NAD. Intubated, sedated. HEENT: [place]/AT, anicteric sclera, PERRL 3mm (sluggish), moist mucous membranes. Neuro: Sedated, does not follow commands CV: Tachycardic, regular rhythm, no m/g/r. PULM: No accessory muscle use, bilateral scattered rhonchi GI: Soft, nontender, nondistended. Biliary drain to RUQ with thin yellow-amber liquid in bag, thick gold-colored sediment in tubing. Extremities: No LE edema noted. Skin: Warm/dry, no rashes.  ABG shows metabolic acidosis. Labs show BUN/creatinine 30/1.8, normal electrolytes, lactate 6.4, mild leukocytosis  Resolved Hospital Problem List:    Assessment & Plan:  Septic shock, most likely due to ischemic bowel due to small bowel obstruction/internal hernia versus adhesion  -Less likely biliary sepsis, possible component of aspiration PNA - - Goal MAP > 65 - Fluid resuscitation as tolerated - Levophed titrated to goal MAP + vaso - Continue broad-spectrum antibiotics (meropenem/vanc)   Acute toxic encephalopathy, likely septic etiology ?L-sided deficits (LUE) per family present x 1 week Dementia Head CT negative - Correct metabolic derangements - Delirium precautions  Acute hypoxemic respiratory failure Concern for aspiration PNA - Continue full vent support (4-8cc/kg IBW) - Wean FiO2 for O2 sat > 90% - Daily WUA/SBT - VAP bundle - Pulmonary hygiene - PAD protocol for sedation: Fentanyl for goal RASS 0 to -1  HFrEF NICM History of SVT/NSVT, s/p ICD (since removed) AS s/p TAVR Echo 11/2018 with EF 25-30%, diffuse LV hypokinesis, degenerative MV. 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 -Discussed with surgery, not a candidate for laparotomy - Biliary drain remains in place - Tube management per protocol - Empiric antibiotic coverage as above  AKI, likely ATN in the setting of sepsis/hypotension Metabolic acidosis - Trend BMP - Replete electrolytes as indicated - Monitor I&Os - Avoid nephrotoxic agents as able - Ensure adequate renal perfusion  Chronically bedbound At risk for malnutrition Physical deconditioning, POA - patient has reportedly been offered palliative care in the past but family desired continued aggressive care  Best Practice: (right click and "Reselect all SmartList Selections" daily)   Diet/type: NPO DVT prophylaxis: SCDs GI prophylaxis: PPI Lines: N/A - will place femoral CVC/A-line Foley:  Yes, and it is still needed Code Status:  DNR Last date of multidisciplinary goals of care discussion [Confirmed wishes for DNR (no CPR, +intubation and medications, etc)] 04/06/2023 I spoke to spouse and with results of CT scan.  Surgeon spoke to her, he is not a candidate for surgery and she fully understands this.  If she would like to keep him comfortable.  She will be visiting later on with her daughters and we will discuss withdrawal of life support No escalation of care in the meantime  Labs:  CBC: Recent Labs  Lab 04/07/2023 2225 03/25/2023 2253 03/19/2023 0124 03/31/2023 0137 03/26/2023 0431  WBC 21.0*  --   --  16.0*  --   NEUTROABS 18.2*  --   --  14.7*  --   HGB 16.0 15.6  15.6 12.2* 12.7* 12.9*  HCT 46.8 46.0  46.0 36.0* 37.6* 38.0*  MCV 99.8  --   --  101.6*  --   PLT 197  --   --  166  --     Basic Metabolic Panel: Recent Labs  Lab 03/19/2023 2225 03/20/2023 2253 04/03/2023 0124 03/21/2023 0137 04/03/2023 0431  NA 136 136  135 136 135 134*  K 4.9 4.6  4.6 4.0 3.9 3.8  CL 100 104  --  101  --   CO2 15*  --   --  19*  --   GLUCOSE 279* 276*  --  327*  --   BUN 36*  34*  --  30*  --   CREATININE 1.85* 1.60*  --  1.82*  --   CALCIUM 8.9  --   --  7.9*  --     GFR: Estimated Creatinine Clearance: 26.2 mL/min (A) (by C-G formula based on SCr of 1.82 mg/dL (H)). Recent Labs  Lab 04/09/2023 2225 03/24/2023 0137  WBC 21.0* 16.0*  LATICACIDVEN  --  6.4*    Liver Function Tests: Recent Labs  Lab 03/18/2023 2225 04/07/2023 0137  AST 26 26  ALT 12 11  ALKPHOS 123 93  BILITOT 2.2* 1.6*  PROT 6.9 5.3*  ALBUMIN 2.9* 2.0*    No results for input(s): "LIPASE", "AMYLASE" in the last 168 hours. No results for input(s): "AMMONIA" in the last 168 hours.  ABG:    Component Value Date/Time   PHART 7.170 (LL) 04/09/2023 0431   PCO2ART 47.8 04/05/2023 0431   PO2ART 87 04/09/2023 0431   HCO3 17.3 (L) 04/03/2023 0431   TCO2 19 (L) 04/12/2023 0431   ACIDBASEDEF 11.0 (H) 03/21/2023 0431   O2SAT 93 04/07/2023 0431    Coagulation Profile: No results for input(s): "INR", "PROTIME" in the last 168 hours.  Cardiac Enzymes: No results for input(s): "CKTOTAL", "CKMB", "CKMBINDEX", "TROPONINI" in the last 168 hours.  HbA1C: Hgb A1c MFr Bld  Date/Time Value Ref Range Status  03/29/2023 01:37 AM 4.4 (L) 4.8 - 5.6 % Final    Comment:    (NOTE) Pre diabetes:          5.7%-6.4%  Diabetes:              >6.4%  Glycemic control for   <7.0% adults with diabetes   01/27/2019 04:25 AM 5.7 (H) 4.8 - 5.6 % Final    Comment:    (NOTE) Pre diabetes:          5.7%-6.4% Diabetes:              >6.4% Glycemic control for   <7.0% adults with diabetes    CBG: Recent Labs  Lab 03/19/2023 0353 04/01/2023 0721  GLUCAP 291* 280*     Critical care time:   The patient is critically ill with multiple organ system failure and requires high complexity decision making for assessment and support, frequent evaluation and titration of therapies, advanced monitoring, review of radiographic studies and interpretation of complex data.   Critical Care Time devoted to patient care  services, exclusive of separately billable procedures, described in this note is 40 minutes.  Comer Locket Vassie Loll, MD  Pulmonary & Critical Care  Apr 07, 2023 9:10 AM  Please see [email or web address] for pager details.  From 7A-7P if no response, please call [phone] After hours, please call ELink [phone]

## 2023-04-14 NOTE — Procedures (Signed)
Arterial Catheter Insertion Procedure Note  Haik Mahoney Pitzer  [id number]  24-Sep-1928  Date:  Time:1:20 AM    Provider Performing: Lynnell Catalan    Procedure: Insertion of Arterial Line (78295) with US guidance (62130)   Indication(s) Blood pressure monitoring and/or need for frequent ABGs  Consent Risks of the procedure as well as the alternatives and risks of each were explained to the patient and/or caregiver.  Consent for the procedure was obtained and is signed in the bedside chart  Anesthesia None   Time Out Verified patient identification, verified procedure, site/side was marked, verified correct patient position, special equipment/implants available, medications/allergies/relevant history reviewed, required imaging and test results available.   Sterile Technique Maximal sterile technique including full sterile barrier drape, hand hygiene, sterile gown, sterile gloves, mask, hair covering, sterile ultrasound probe cover (if used).   Procedure Description Area of catheter insertion was cleaned with chlorhexidine and draped in sterile fashion. With real-time ultrasound guidance an arterial catheter was placed into the right femoral artery.  Appropriate arterial tracings confirmed on monitor.     Complications/Tolerance None; patient tolerated the procedure well.   EBL Minimal   Specimen(s) None   Lynnell Catalan, MD [facility] ICU Physician [facility] Critical [facility]  Pager: [phone] Or Epic Secure Chat After hours: [phone].  , 1:20 AM

## 2023-04-14 NOTE — Progress Notes (Signed)
  Interdisciplinary Goals of Care Family Meeting   Date carried out::   Location of the meeting: Unit  Member's involved: Physician and Family Member or next of kin wife, Dr. Larita Fife and daughter Revonda Standard on the phone  Durable Power of Attorney or acting medical decision maker: Wife anne  Discussion: We discussed goals of care for William [name] .  We discussed that he is not a surgical candidate and without exploratory laparotomy, death is certain since he has gangrenous bowel with florid shock.  They would like [name] to be comfortable.  They would like life support to be withdrawn.  We discussed what that would look like and how we would ensure his comfort.  Withdrawal of life support orders written  Code status: Full DNR  Disposition: In-patient comfort care   Time spent for the meeting: 25 m  Malani Lees V. Louanna Vanliew , 2:56 PM

## 2023-04-14 NOTE — Consult Note (Signed)
Reviewed CT scan and talked with Dr. Denese Killings and patient's wife and daughter Revonda Standard.  Patient hypotensive on multiple pressors.  CT with findings of a bowel obstruction and edema in the mesentery - likely strangulated internal hernia from previous surgical adhesions, maybe embolic mesenteric ischemia.  I'm not sure exactly the cause, but it appears a segment of his small intestine is becoming necrotic causing this overwhelming sepsis.  At this point I do not feel he is a candidate for surgery due to his age, medical issues, and severe sepsis.  I feel a surgery would be futile and the family should focus on comfort measures.  Please reach out to surgery team if needed.  Quentin Ore, MD General, Bariatric and Minimally Invasive Surgery [facility]

## 2023-04-14 DEATH — deceased

## 2023-08-05 ENCOUNTER — Telehealth: Payer: Self-pay | Admitting: Infectious Disease

## 2023-08-27 ENCOUNTER — Other Ambulatory Visit (HOSPITAL_BASED_OUTPATIENT_CLINIC_OR_DEPARTMENT_OTHER): Payer: Self-pay
